# Patient Record
Sex: Female | Born: 1960 | Smoking: Never smoker
Health system: Northeastern US, Academic
[De-identification: ages and names within clinical notes are randomized; demographics above are authoritative.]

## PROBLEM LIST (undated history)

## (undated) DIAGNOSIS — F329 Major depressive disorder, single episode, unspecified: Secondary | ICD-10-CM

## (undated) DIAGNOSIS — J449 Chronic obstructive pulmonary disease, unspecified: Secondary | ICD-10-CM

## (undated) DIAGNOSIS — K5904 Chronic idiopathic constipation: Secondary | ICD-10-CM

## (undated) DIAGNOSIS — E785 Hyperlipidemia, unspecified: Secondary | ICD-10-CM

## (undated) DIAGNOSIS — F17200 Nicotine dependence, unspecified, uncomplicated: Secondary | ICD-10-CM

## (undated) DIAGNOSIS — C539 Malignant neoplasm of cervix uteri, unspecified: Secondary | ICD-10-CM

## (undated) DIAGNOSIS — F32A Depression, unspecified: Secondary | ICD-10-CM

## (undated) DIAGNOSIS — N809 Endometriosis, unspecified: Secondary | ICD-10-CM

## (undated) HISTORY — DX: Depression, unspecified: F32.A

## (undated) HISTORY — PX: NASAL SEPTUM SURGERY: SHX37

## (undated) HISTORY — PX: LASER ABLATION CONDYLOMA CERVICAL / VULVAR: SUR819

## (undated) HISTORY — PX: TONSILLECTOMY: SUR1361

## (undated) HISTORY — DX: Hyperlipidemia, unspecified: E78.5

## (undated) HISTORY — DX: Nicotine dependence, unspecified, uncomplicated: F17.200

## (undated) HISTORY — PX: BREAST EXCISIONAL BIOPSY: SUR124

## (undated) HISTORY — PX: FOOT SURGERY: SHX648

## (undated) HISTORY — DX: Malignant neoplasm of cervix uteri, unspecified: C53.9

## (undated) HISTORY — PX: ABLATION ON ENDOMETRIOSIS: SHX5787

## (undated) HISTORY — DX: Major depressive disorder, single episode, unspecified: F32.9

## (undated) HISTORY — PX: APPENDECTOMY: SHX54

## (undated) HISTORY — DX: Endometriosis, unspecified: N80.9

## (undated) HISTORY — PX: OTHER SURGICAL HISTORY: SHX169

---
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
## 1978-06-17 HISTORY — PX: OVARIAN CYST SURGERY: SHX726

## 1981-06-17 HISTORY — PX: SALPINGOOPHORECTOMY: SHX82

## 1990-06-17 HISTORY — PX: EXPLORATORY LAPAROTOMY: SUR591

## 1998-08-10 ENCOUNTER — Emergency Department (HOSPITAL_COMMUNITY): Admission: EM | Admit: 1998-08-10 | Discharge: 1998-08-10 | Payer: Self-pay | Admitting: Emergency Medicine

## 2002-04-22 ENCOUNTER — Encounter: Payer: Self-pay | Admitting: Family Medicine

## 2002-04-22 ENCOUNTER — Ambulatory Visit (HOSPITAL_COMMUNITY): Admission: RE | Admit: 2002-04-22 | Discharge: 2002-04-22 | Payer: Self-pay | Admitting: Family Medicine

## 2002-06-17 DIAGNOSIS — D329 Benign neoplasm of meninges, unspecified: Secondary | ICD-10-CM | POA: Insufficient documentation

## 2002-10-16 HISTORY — PX: CRANIECTOMY: SHX331

## 2006-01-15 HISTORY — PX: DILATION AND CURETTAGE OF UTERUS: SHX78

## 2006-05-14 ENCOUNTER — Ambulatory Visit: Payer: Self-pay | Admitting: Orthopedic Surgery

## 2006-05-26 ENCOUNTER — Ambulatory Visit: Payer: Self-pay | Admitting: Orthopedic Surgery

## 2006-06-30 ENCOUNTER — Ambulatory Visit: Payer: Self-pay | Admitting: Orthopedic Surgery

## 2008-01-30 ENCOUNTER — Emergency Department (HOSPITAL_COMMUNITY): Admission: EM | Admit: 2008-01-30 | Discharge: 2008-01-31 | Payer: Self-pay | Admitting: Emergency Medicine

## 2009-04-04 ENCOUNTER — Inpatient Hospital Stay (HOSPITAL_COMMUNITY): Admission: EM | Admit: 2009-04-04 | Discharge: 2009-04-07 | Payer: Self-pay | Admitting: Emergency Medicine

## 2009-10-11 ENCOUNTER — Other Ambulatory Visit: Admission: RE | Admit: 2009-10-11 | Discharge: 2009-10-11 | Payer: Self-pay | Admitting: Obstetrics & Gynecology

## 2009-10-12 ENCOUNTER — Ambulatory Visit (HOSPITAL_COMMUNITY): Admission: RE | Admit: 2009-10-12 | Discharge: 2009-10-12 | Payer: Self-pay | Admitting: Obstetrics & Gynecology

## 2009-10-18 ENCOUNTER — Ambulatory Visit (HOSPITAL_COMMUNITY): Admission: RE | Admit: 2009-10-18 | Discharge: 2009-10-18 | Payer: Self-pay | Admitting: Obstetrics & Gynecology

## 2010-09-20 LAB — DIFFERENTIAL
Basophils Relative: 0 % (ref 0–1)
Lymphocytes Relative: 29 % (ref 12–46)
Lymphs Abs: 3 10*3/uL (ref 0.7–4.0)
Monocytes Absolute: 0.4 10*3/uL (ref 0.1–1.0)
Monocytes Relative: 4 % (ref 3–12)
Neutro Abs: 8 10*3/uL — ABNORMAL HIGH (ref 1.7–7.7)
Neutrophils Relative %: 63 % (ref 43–77)

## 2010-09-20 LAB — BASIC METABOLIC PANEL
BUN: 7 mg/dL (ref 6–23)
CO2: 34 mEq/L — ABNORMAL HIGH (ref 19–32)
Calcium: 9.3 mg/dL (ref 8.4–10.5)
Chloride: 103 mEq/L (ref 96–112)
Creatinine, Ser: 0.43 mg/dL (ref 0.4–1.2)
GFR calc Af Amer: >60 mL/min (ref >60)
GFR calc Af Amer: >60 mL/min (ref >60)
GFR calc non Af Amer: >60 mL/min (ref >60)
Potassium: 4.4 mEq/L (ref 3.5–5.1)
Sodium: 140 mEq/L (ref 135–145)

## 2010-09-20 LAB — CBC
HCT: 42.3 % (ref 36.0–46.0)
Hemoglobin: 12.5 g/dL (ref 12.0–15.0)
MCHC: 33.8 g/dL (ref 30.0–36.0)
MCV: 86.9 fL (ref 78.0–100.0)
Platelets: 213 10*3/uL (ref 150–400)
RBC: 4.26 MIL/uL (ref 3.87–5.11)
RBC: 4.92 MIL/uL (ref 3.87–5.11)
WBC: 10.5 10*3/uL (ref 4.0–10.5)

## 2010-09-20 LAB — BLOOD GAS, ARTERIAL
Bicarbonate: 28.5 mEq/L — ABNORMAL HIGH (ref 20.0–24.0)
O2 Content: 6 L/min
pH, Arterial: 7.302 — ABNORMAL LOW (ref 7.350–7.400)
pO2, Arterial: 84.1 mmHg (ref 80.0–100.0)

## 2010-09-20 LAB — GLUCOSE, CAPILLARY
Glucose-Capillary: 148 mg/dL — ABNORMAL HIGH (ref 70–99)
Glucose-Capillary: 66 mg/dL — ABNORMAL LOW (ref 70–99)

## 2013-03-22 DIAGNOSIS — F79 Unspecified intellectual disabilities: Secondary | ICD-10-CM | POA: Insufficient documentation

## 2014-08-09 DIAGNOSIS — F329 Major depressive disorder, single episode, unspecified: Secondary | ICD-10-CM | POA: Diagnosis not present

## 2014-08-09 DIAGNOSIS — J449 Chronic obstructive pulmonary disease, unspecified: Secondary | ICD-10-CM | POA: Diagnosis not present

## 2014-08-09 DIAGNOSIS — F1020 Alcohol dependence, uncomplicated: Secondary | ICD-10-CM | POA: Diagnosis not present

## 2014-08-09 DIAGNOSIS — Z720 Tobacco use: Secondary | ICD-10-CM | POA: Diagnosis not present

## 2014-09-29 DIAGNOSIS — F411 Generalized anxiety disorder: Secondary | ICD-10-CM | POA: Insufficient documentation

## 2014-12-16 DIAGNOSIS — F1020 Alcohol dependence, uncomplicated: Secondary | ICD-10-CM | POA: Diagnosis not present

## 2014-12-16 DIAGNOSIS — F329 Major depressive disorder, single episode, unspecified: Secondary | ICD-10-CM | POA: Diagnosis not present

## 2014-12-16 DIAGNOSIS — Z681 Body mass index (BMI) 19 or less, adult: Secondary | ICD-10-CM | POA: Diagnosis not present

## 2014-12-16 DIAGNOSIS — N390 Urinary tract infection, site not specified: Secondary | ICD-10-CM | POA: Diagnosis not present

## 2015-01-16 DIAGNOSIS — Z720 Tobacco use: Secondary | ICD-10-CM | POA: Diagnosis not present

## 2015-01-16 DIAGNOSIS — F320 Major depressive disorder, single episode, mild: Secondary | ICD-10-CM | POA: Diagnosis not present

## 2015-01-16 DIAGNOSIS — Z681 Body mass index (BMI) 19 or less, adult: Secondary | ICD-10-CM | POA: Diagnosis not present

## 2015-01-16 DIAGNOSIS — F1020 Alcohol dependence, uncomplicated: Secondary | ICD-10-CM | POA: Diagnosis not present

## 2015-04-27 DIAGNOSIS — M542 Cervicalgia: Secondary | ICD-10-CM | POA: Diagnosis not present

## 2015-04-27 DIAGNOSIS — Z681 Body mass index (BMI) 19 or less, adult: Secondary | ICD-10-CM | POA: Diagnosis not present

## 2015-06-23 DIAGNOSIS — M791 Myalgia: Secondary | ICD-10-CM | POA: Diagnosis not present

## 2015-06-23 DIAGNOSIS — M9903 Segmental and somatic dysfunction of lumbar region: Secondary | ICD-10-CM | POA: Diagnosis not present

## 2015-06-23 DIAGNOSIS — M9904 Segmental and somatic dysfunction of sacral region: Secondary | ICD-10-CM | POA: Diagnosis not present

## 2015-06-23 DIAGNOSIS — M9901 Segmental and somatic dysfunction of cervical region: Secondary | ICD-10-CM | POA: Diagnosis not present

## 2015-06-23 DIAGNOSIS — M5413 Radiculopathy, cervicothoracic region: Secondary | ICD-10-CM | POA: Diagnosis not present

## 2015-09-20 DIAGNOSIS — Z681 Body mass index (BMI) 19 or less, adult: Secondary | ICD-10-CM | POA: Diagnosis not present

## 2015-09-20 DIAGNOSIS — S62357A Nondisplaced fracture of shaft of fifth metacarpal bone, left hand, initial encounter for closed fracture: Secondary | ICD-10-CM | POA: Diagnosis not present

## 2015-09-20 DIAGNOSIS — J449 Chronic obstructive pulmonary disease, unspecified: Secondary | ICD-10-CM | POA: Diagnosis not present

## 2015-09-20 DIAGNOSIS — S63055A Dislocation of other carpometacarpal joint of left hand, initial encounter: Secondary | ICD-10-CM | POA: Diagnosis not present

## 2015-09-27 ENCOUNTER — Emergency Department (HOSPITAL_COMMUNITY)
Admission: EM | Admit: 2015-09-27 | Discharge: 2015-09-28 | Disposition: A | Discharge status: Home / Self Care | Payer: Commercial Managed Care - HMO | Attending: Emergency Medicine | Admitting: Emergency Medicine

## 2015-09-27 DIAGNOSIS — Z7951 Long term (current) use of inhaled steroids: Secondary | ICD-10-CM | POA: Insufficient documentation

## 2015-09-27 DIAGNOSIS — Y905 Blood alcohol level of 100-119 mg/100 ml: Secondary | ICD-10-CM | POA: Diagnosis not present

## 2015-09-27 DIAGNOSIS — Z7982 Long term (current) use of aspirin: Secondary | ICD-10-CM | POA: Diagnosis not present

## 2015-09-27 DIAGNOSIS — F1010 Alcohol abuse, uncomplicated: Secondary | ICD-10-CM | POA: Insufficient documentation

## 2015-09-27 DIAGNOSIS — F17210 Nicotine dependence, cigarettes, uncomplicated: Secondary | ICD-10-CM | POA: Insufficient documentation

## 2015-09-27 DIAGNOSIS — J449 Chronic obstructive pulmonary disease, unspecified: Secondary | ICD-10-CM | POA: Insufficient documentation

## 2015-09-27 DIAGNOSIS — F10129 Alcohol abuse with intoxication, unspecified: Secondary | ICD-10-CM | POA: Diagnosis not present

## 2015-09-27 DIAGNOSIS — Z8719 Personal history of other diseases of the digestive system: Secondary | ICD-10-CM | POA: Diagnosis not present

## 2015-09-27 DIAGNOSIS — Z79899 Other long term (current) drug therapy: Secondary | ICD-10-CM | POA: Diagnosis not present

## 2015-09-27 HISTORY — DX: Chronic idiopathic constipation: K59.04

## 2015-09-27 HISTORY — DX: Chronic obstructive pulmonary disease, unspecified: J44.9

## 2015-09-28 ENCOUNTER — Encounter (HOSPITAL_COMMUNITY): Payer: Self-pay | Admitting: Emergency Medicine

## 2015-09-28 LAB — ETHANOL: Alcohol, Ethyl (B): 108 mg/dL — ABNORMAL HIGH (ref <5)

## 2015-09-28 LAB — CBC WITH DIFFERENTIAL/PLATELET
BASOS PCT: 0 %
Basophils Absolute: 0 10*3/uL (ref 0.0–0.1)
EOS ABS: 0.1 10*3/uL (ref 0.0–0.7)
Eosinophils Relative: 2 %
HCT: 49.2 % — ABNORMAL HIGH (ref 36.0–46.0)
Hemoglobin: 17.6 g/dL — ABNORMAL HIGH (ref 12.0–15.0)
Lymphocytes Relative: 44 %
Lymphs Abs: 4.1 10*3/uL — ABNORMAL HIGH (ref 0.7–4.0)
MCH: 31.1 pg (ref 26.0–34.0)
MCHC: 35.8 g/dL (ref 30.0–36.0)
MCV: 86.9 fL (ref 78.0–100.0)
MONO ABS: 0.5 10*3/uL (ref 0.1–1.0)
MONOS PCT: 6 %
Neutro Abs: 4.6 10*3/uL (ref 1.7–7.7)
Neutrophils Relative %: 48 %
Platelets: 204 10*3/uL (ref 150–400)
RBC: 5.66 MIL/uL — ABNORMAL HIGH (ref 3.87–5.11)
RDW: 13.9 % (ref 11.5–15.5)
WBC: 9.5 10*3/uL (ref 4.0–10.5)

## 2015-09-28 LAB — COMPREHENSIVE METABOLIC PANEL
ALBUMIN: 4.1 g/dL (ref 3.5–5.0)
ALT: 14 U/L (ref 14–54)
ANION GAP: 10 (ref 5–15)
AST: 23 U/L (ref 15–41)
Alkaline Phosphatase: 65 U/L (ref 38–126)
BILIRUBIN TOTAL: 0.7 mg/dL (ref 0.3–1.2)
BUN: 7 mg/dL (ref 6–20)
CO2: 29 mmol/L (ref 22–32)
Calcium: 9.1 mg/dL (ref 8.9–10.3)
Chloride: 97 mmol/L — ABNORMAL LOW (ref 101–111)
Creatinine, Ser: 0.58 mg/dL (ref 0.44–1.00)
GFR calc Af Amer: >60 mL/min (ref >60)
GFR calc non Af Amer: >60 mL/min (ref >60)
GLUCOSE: 98 mg/dL (ref 65–99)
POTASSIUM: 4.5 mmol/L (ref 3.5–5.1)
SODIUM: 136 mmol/L (ref 135–145)
TOTAL PROTEIN: 6.7 g/dL (ref 6.5–8.1)

## 2015-09-28 NOTE — Discharge Instructions (Signed)
Alcohol Use Disorder °Alcohol use disorder is a mental disorder. It is not a one-time incident of heavy drinking. Alcohol use disorder is the excessive and uncontrollable use of alcohol over time that leads to problems with functioning in one or more areas of daily living. People with this disorder risk harming themselves and others when they drink to excess. Alcohol use disorder also can cause other mental disorders, such as mood and anxiety disorders, and serious physical problems. People with alcohol use disorder often misuse other drugs.  °Alcohol use disorder is common and widespread. Some people with this disorder drink alcohol to cope with or escape from negative life events. Others drink to relieve chronic pain or symptoms of mental illness. People with a family history of alcohol use disorder are at higher risk of losing control and using alcohol to excess.  °Drinking too much alcohol can cause injury, accidents, and health problems. One drink can be too much when you are: °· Working. °· Pregnant or breastfeeding. °· Taking medicines. Ask your doctor. °· Driving or planning to drive. °SYMPTOMS  °Signs and symptoms of alcohol use disorder may include the following:  °· Consumption of alcohol in larger amounts or over a longer period of time than intended. °· Multiple unsuccessful attempts to cut down or control alcohol use.   °· A great deal of time spent obtaining alcohol, using alcohol, or recovering from the effects of alcohol (hangover). °· A strong desire or urge to use alcohol (cravings).   °· Continued use of alcohol despite problems at work, school, or home because of alcohol use.   °· Continued use of alcohol despite problems in relationships because of alcohol use. °· Continued use of alcohol in situations when it is physically hazardous, such as driving a car. °· Continued use of alcohol despite awareness of a physical or psychological problem that is likely related to alcohol use. Physical  problems related to alcohol use can involve the brain, heart, liver, stomach, and intestines. Psychological problems related to alcohol use include intoxication, depression, anxiety, psychosis, delirium, and dementia.   °· The need for increased amounts of alcohol to achieve the same desired effect, or a decreased effect from the consumption of the same amount of alcohol (tolerance). °· Withdrawal symptoms upon reducing or stopping alcohol use, or alcohol use to reduce or avoid withdrawal symptoms. Withdrawal symptoms include: °¨ Racing heart. °¨ Hand tremor. °¨ Difficulty sleeping. °¨ Nausea. °¨ Vomiting. °¨ Hallucinations. °¨ Restlessness. °¨ Seizures. °DIAGNOSIS °Alcohol use disorder is diagnosed through an assessment by your health care provider. Your health care provider may start by asking three or four questions to screen for excessive or problematic alcohol use. To confirm a diagnosis of alcohol use disorder, at least two symptoms must be present within a 12-month period. The severity of alcohol use disorder depends on the number of symptoms: °· Mild--two or three. °· Moderate--four or five. °· Severe--six or more. °Your health care provider may perform a physical exam or use results from lab tests to see if you have physical problems resulting from alcohol use. Your health care provider may refer you to a mental health professional for evaluation. °TREATMENT  °Some people with alcohol use disorder are able to reduce their alcohol use to low-risk levels. Some people with alcohol use disorder need to quit drinking alcohol. When necessary, mental health professionals with specialized training in substance use treatment can help. Your health care provider can help you decide how severe your alcohol use disorder is and what type of treatment you need.   The following forms of treatment are available:  °· Detoxification. Detoxification involves the use of prescription medicines to prevent alcohol withdrawal  symptoms in the first week after quitting. This is important for people with a history of symptoms of withdrawal and for heavy drinkers who are likely to have withdrawal symptoms. Alcohol withdrawal can be dangerous and, in severe cases, cause death. Detoxification is usually provided in a hospital or in-patient substance use treatment facility. °· Counseling or talk therapy. Talk therapy is provided by substance use treatment counselors. It addresses the reasons people use alcohol and ways to keep them from drinking again. The goals of talk therapy are to help people with alcohol use disorder find healthy activities and ways to cope with life stress, to identify and avoid triggers for alcohol use, and to handle cravings, which can cause relapse. °· Medicines. Different medicines can help treat alcohol use disorder through the following actions: °¨ Decrease alcohol cravings. °¨ Decrease the positive reward response felt from alcohol use. °¨ Produce an uncomfortable physical reaction when alcohol is used (aversion therapy). °· Support groups. Support groups are run by people who have quit drinking. They provide emotional support, advice, and guidance. °These forms of treatment are often combined. Some people with alcohol use disorder benefit from intensive combination treatment provided by specialized substance use treatment centers. Both inpatient and outpatient treatment programs are available. °  °This information is not intended to replace advice given to you by your health care provider. Make sure you discuss any questions you have with your health care provider. °  °Document Released: 07/11/2004 Document Revised: 06/24/2014 Document Reviewed: 09/10/2012 °Elsevier Interactive Patient Education ©2016 [employer] °Substance Abuse Treatment Programs ° °Intensive Outpatient Programs °[facility]     °[address]      °[place], [place]                   °336-878-6098      ° °[facility] °[address] °[place], [place] °[phone] ° °[facility]     °(Inpatient and outpatient)     °[address]           °[phone]   ° °[facility] °336-288-1484 (Suboxone and Methadone) ° °[address]      °[place], [place] [address]      °336-882-2125      ° °[address] °[place], [place] °852-3033 ° °[facility] (Outpatient/Inpatient, [facility])    °(insurance only) [phone]      °       °[facility]Groups & Residential) °[place], [place] °336-389-1413 ° °   °[facility]     °[address]     °[place], [place]      °[phone]      ° °[facility] (for caregivers and family) °[address] °[place], [place] °336-299-4655 ° ° ° ° ° °Residential Treatment Programs °[facility]      °[address], [place], [place] [address]  °([phone]      ° °[facility] °[address] [place], [place] [address] °[phone] ° °[facility]        °336-248-8914      ° °[facility] °1-800-659-3381 ° °[facility]             °[address]                                         °  [place], [place]                                                °877-615-2722 or 336-784-9470                              ° °[facility] [place] °[address] °[place], [address] °1.877.941.8954 ° °[facility]    °[address]      °[place], [place]     °[phone]      ° °[facility] °[address] °[place], [place] °336-285-9073 ° °[facility]   °[address]     °[place], [place] [address]     °336-899-1550      °Admissions: 8am-3pm M-F ° °[facility] °[address] °[place], [place] °336-227-7417 ° °[facility]: Residential Program (90 Days)   °[place], [place]      °336-725-8389 or 800-758-6077    ° °[facility] °[place], [place] °(Walk in Hours over the weekend or by  referral) ° °[facility] °[address], [place], [place] [address] °(336) [phone] ° °Crisis Mobile: [facility]:  1-877-626-1772 (for crisis response 24 hours a day) °[facility] Hotline:      1-800-256-2452 °Outpatient Psychiatry and Counseling ° °[facility]: Mobile Crisis Management 24 hours:  1-877-626-1772 ° °[facility] sliding scale fee and walk in schedule: M-F 8am-12pm/1pm-3pm °[address]  °[place], [place] [address] °[phone] ° °[facility] °[address] °[place], [place] [address] °336-703-9650 ° °[facility] ([facility] known [facility])- new patient walk-in appointments available Monday - Friday 8am -3pm.          °[address] °[place], [place] [address] °336-676-6840 or crisis line- 336-676-6905 ° °[facility] Intensive Outpatient Therapy Program °[address] °[place], [place] [address] °336-832-9804 ° °[facility]                  °Crisis Services      °336.641.4993      °[address]     °[place], [place] [address]                ° °[facility]   °[facility] °[phone] °[address] °[place], [place] [address] ° ° °[facility]          °[address]  °[place], [place] [address]       °(336) [phone] ° °[facility] °[address] °[place], [place] [address] °336-292-1510 ° °[facility]    °[address]    °[place], [place] [address]     °[phone]      ° °Parish McKinney, MD     °[address]     °[place] [address]     °336-282-1251     °  °[facility] °[address] °[place]  [address] ° °[facility]     °[address]     °[place],       °336-542-2076      ° °[facility] °Shamsher Ahluwalia, MD °[address] °  [place], [place]  [address] °[phone] ° °[facility]     °[address]     °[place], [place] [address]     °336-274-1237      ° °[facility] °[address] °[place], [place] [address] °336-854-4450 °Resources for Temporary Residential Assistance/Crisis Centers ° °DAY CENTERS °[facility] °M-F 8am-3pm   °[address] [place], [place] [address]   336-332-0824 °Services include: laundry, barbering, support groups, case management, phone  & computer access, showers, AA/NA mtgs, mental health/substance abuse nurse, job skills class, disability information, VA assistance, spiritual classes, etc.  ° °HOMELESS SHELTERS ° °[facility]     °[facility]   °[address], [place]     °336.271.5959       °       °[facility] (women and children)       °[address] °[place], [place] [address] °336-275-0820 °[email or web address] for application and process °Application Required ° °[facility]   °[address]    °[place] [address]     °[phone]       °             °[facility] °[address] °[place], [place] [address] °336.273.5572 °336-235-0363(schedule application appt.) °Application Required ° °[facility] (women only)    °[address]     °[place], [place] [address]     °336-884-1039      °Intake starts 6pm daily °Need valid ID, SSC, & Police report °[facility] [place] °[address] °[place], [place] °336-881-5420 °Application Required ° °[facility] (men only)     °[address]      °[place], [place]     °[phone]      ° °[facility] °(Pregnant women only) °[address] °, [place] °336-275-0206 ° °[facility]      °[address]      °[place], [place] [address]     °[phone]      °       °[facility] °[address] °[place], [place] °336-723-1848 °90 day commitment/SA/Application process ° °[facility]     °[address]     °[place], [place]     °[phone]       °Check-in at 7pm     °       °[facility] °[address] °[place], [place] [address] °336-248-6684 °Men/Women/Women and Children must be there by 7 pm ° °[facility] °[place],  °336-722-8721                ° °

## 2015-09-28 NOTE — ED Provider Notes (Signed)
CSN: [id number]     Arrival date & time 09/27/15  2343 History   First MD Initiated Contact with Patient 09/28/15 [phone]     Chief Complaint  Patient presents with  . Alcohol Problem     (Consider location/radiation/quality/duration/timing/severity/associated sxs/prior Treatment) HPI Comments: Patient presents to the ER for evaluation of alcohol abuse. Patient reports that she has been drinking for 25-30 years. Her last drink was one and a half hours ago. She reports that she finally got up the nerve tonight to stop drinking. She told her mother who apparently called the police and she was brought here for treatment. She is not homicidal or suicidal. Patient reports that she does get shaky sometimes when she doesn't drink but she has never had DTs, seizures, significant withdrawal symptoms.  Patient is a 55 y.o. female presenting with alcohol problem.  Alcohol Problem    Past Medical History  Diagnosis Date  . COPD (chronic obstructive pulmonary disease) ([place])   . Chronic idiopathic constipation    History reviewed. No pertinent past surgical history. History reviewed. No pertinent family history. Social History  Substance Use Topics  . Smoking status: Current Every Day Smoker -- 2.00 packs/day    Types: Cigarettes  . Smokeless tobacco: None  . Alcohol Use: Yes   OB History    No data available     Review of Systems  Psychiatric/Behavioral: Negative for suicidal ideas.  All other systems reviewed and are negative.     Allergies  Imitrex  Home Medications   Prior to Admission medications   Medication Sig Start Date End Date Taking? Authorizing Provider  aspirin 81 MG chewable tablet Chew 81 mg by mouth daily.   Yes Historical Provider, MD  cholecalciferol (VITAMIN D) 1000 units tablet Take 1,000 Units by mouth daily.   Yes Historical Provider, MD  diazepam (VALIUM) 5 MG tablet Take 5 mg by mouth daily as needed for anxiety.   Yes Historical Provider, MD   Fluticasone-Salmeterol (ADVAIR) 250-50 MCG/DOSE AEPB Inhale 1 puff into the lungs 2 (two) times daily.   Yes Historical Provider, MD  gabapentin (NEURONTIN) 300 MG capsule Take 300 mg by mouth 3 (three) times daily.   Yes Historical Provider, MD  lubiprostone (AMITIZA) 24 MCG capsule Take 24 mcg by mouth daily.   Yes Historical Provider, MD  Multiple Vitamins-Minerals (MULTIVITAMIN ADULT) TABS Take 1 tablet by mouth daily.   Yes Historical Provider, MD  tiotropium (SPIRIVA) 18 MCG inhalation capsule Place 18 mcg into inhaler and inhale daily.   Yes Historical Provider, MD  traZODone (DESYREL) 50 MG tablet Take 100 mg by mouth at bedtime.   Yes Historical Provider, MD  venlafaxine XR (EFFEXOR-XR) 150 MG 24 hr capsule Take 150 mg by mouth daily with breakfast.   Yes Historical Provider, MD  vitamin B-12 (CYANOCOBALAMIN) 1000 MCG tablet Take 1,000 mcg by mouth daily.   Yes Historical Provider, MD  vitamin E 100 UNIT capsule Take 100 Units by mouth daily.   Yes Historical Provider, MD   BP 109/87 mmHg  Pulse 114  Temp(Src) 98.5 F (36.9 C) (Oral)  Resp 20  SpO2 93% Physical Exam  Constitutional: She is oriented to person, place, and time. She appears well-developed and well-nourished. No distress.  HENT:  Head: Normocephalic and atraumatic.  Right Ear: Hearing normal.  Left Ear: Hearing normal.  Nose: Nose normal.  Mouth/Throat: Oropharynx is clear and moist and mucous membranes are normal.  Eyes: Conjunctivae and EOM are normal. Pupils are equal,  round, and reactive to light.  Neck: Normal range of motion. Neck supple.  Cardiovascular: Regular rhythm, S1 normal and S2 normal.  Exam reveals no gallop and no friction rub.   No murmur heard. Pulmonary/Chest: Effort normal and breath sounds normal. No respiratory distress. She exhibits no tenderness.  Abdominal: Soft. Normal appearance and bowel sounds are normal. There is no hepatosplenomegaly. There is no tenderness. There is no rebound, no  guarding, no tenderness at McBurney's point and negative Murphy's sign. No hernia.  Musculoskeletal: Normal range of motion.  Neurological: She is alert and oriented to person, place, and time. She has normal strength. No cranial nerve deficit or sensory deficit. Coordination normal. GCS eye subscore is 4. GCS verbal subscore is 5. GCS motor subscore is 6.  Skin: Skin is warm, dry and intact. No rash noted. No cyanosis.  Psychiatric: She has a normal mood and affect. Her speech is normal and behavior is normal. Thought content normal.  Nursing note and vitals reviewed.   ED Course  Procedures (including critical care time) Labs Review Labs Reviewed - No data to display  Imaging Review No results found. I have personally reviewed and evaluated these images and lab results as part of my medical decision-making.   EKG Interpretation None      MDM   Final diagnoses:  None  Alcohol Abuse  Presents to the ER stating that she wants to be voluntarily committed for alcohol abuse. She is not homicidal or suicidal. She has been drinking for nearly 30 years, does not have any history of significant withdrawal risk. Patient informed that she cannot be committed for alcohol, we do not have a detox program. She will receive screening labs in the event that they are needed when she makes phone calls herself tomorrow for detox placement.    Orpah Greek, MD 09/28/15 [phone]

## 2015-09-28 NOTE — ED Notes (Addendum)
Pt states she wants help getting off of alcohol. States she drinks about a 1/2 gallon of Jim Beam in two days x 25 years. Last drink at 11pm. Alert and oriented. Denies SI/HI.

## 2015-09-28 NOTE — ED Notes (Signed)
Patient was alert, oriented and stable upon discharge. RN went over AVS and patient had no further questions.  

## 2015-10-25 DIAGNOSIS — F1020 Alcohol dependence, uncomplicated: Secondary | ICD-10-CM | POA: Diagnosis not present

## 2015-10-25 DIAGNOSIS — R64 Cachexia: Secondary | ICD-10-CM | POA: Diagnosis not present

## 2015-10-25 DIAGNOSIS — Z681 Body mass index (BMI) 19 or less, adult: Secondary | ICD-10-CM | POA: Diagnosis not present

## 2015-10-25 DIAGNOSIS — Z0000 Encounter for general adult medical examination without abnormal findings: Secondary | ICD-10-CM | POA: Diagnosis not present

## 2015-10-25 DIAGNOSIS — F329 Major depressive disorder, single episode, unspecified: Secondary | ICD-10-CM | POA: Diagnosis not present

## 2015-11-28 DIAGNOSIS — Z681 Body mass index (BMI) 19 or less, adult: Secondary | ICD-10-CM | POA: Diagnosis not present

## 2015-11-28 DIAGNOSIS — H1030 Unspecified acute conjunctivitis, unspecified eye: Secondary | ICD-10-CM | POA: Diagnosis not present

## 2016-01-24 DIAGNOSIS — Z720 Tobacco use: Secondary | ICD-10-CM | POA: Diagnosis not present

## 2016-01-24 DIAGNOSIS — Z681 Body mass index (BMI) 19 or less, adult: Secondary | ICD-10-CM | POA: Diagnosis not present

## 2016-01-24 DIAGNOSIS — J440 Chronic obstructive pulmonary disease with acute lower respiratory infection: Secondary | ICD-10-CM | POA: Diagnosis not present

## 2016-01-31 DIAGNOSIS — Z681 Body mass index (BMI) 19 or less, adult: Secondary | ICD-10-CM | POA: Diagnosis not present

## 2016-01-31 DIAGNOSIS — J181 Lobar pneumonia, unspecified organism: Secondary | ICD-10-CM | POA: Diagnosis not present

## 2016-02-07 DIAGNOSIS — Z681 Body mass index (BMI) 19 or less, adult: Secondary | ICD-10-CM | POA: Diagnosis not present

## 2016-02-07 DIAGNOSIS — J181 Lobar pneumonia, unspecified organism: Secondary | ICD-10-CM | POA: Diagnosis not present

## 2016-02-07 DIAGNOSIS — J440 Chronic obstructive pulmonary disease with acute lower respiratory infection: Secondary | ICD-10-CM | POA: Diagnosis not present

## 2016-02-20 DIAGNOSIS — J449 Chronic obstructive pulmonary disease, unspecified: Secondary | ICD-10-CM | POA: Diagnosis not present

## 2016-02-28 DIAGNOSIS — J440 Chronic obstructive pulmonary disease with acute lower respiratory infection: Secondary | ICD-10-CM | POA: Diagnosis not present

## 2016-02-28 DIAGNOSIS — R918 Other nonspecific abnormal finding of lung field: Secondary | ICD-10-CM | POA: Diagnosis not present

## 2016-02-28 DIAGNOSIS — G5702 Lesion of sciatic nerve, left lower limb: Secondary | ICD-10-CM | POA: Diagnosis not present

## 2016-02-28 DIAGNOSIS — J449 Chronic obstructive pulmonary disease, unspecified: Secondary | ICD-10-CM | POA: Diagnosis not present

## 2016-02-28 DIAGNOSIS — Z681 Body mass index (BMI) 19 or less, adult: Secondary | ICD-10-CM | POA: Diagnosis not present

## 2016-02-28 DIAGNOSIS — J984 Other disorders of lung: Secondary | ICD-10-CM | POA: Diagnosis not present

## 2016-02-29 ENCOUNTER — Other Ambulatory Visit: Payer: Self-pay | Admitting: Allergy

## 2016-02-29 DIAGNOSIS — J984 Other disorders of lung: Secondary | ICD-10-CM

## 2016-02-29 DIAGNOSIS — R918 Other nonspecific abnormal finding of lung field: Secondary | ICD-10-CM

## 2016-03-12 ENCOUNTER — Ambulatory Visit
Admission: RE | Admit: 2016-03-12 | Discharge: 2016-03-12 | Disposition: A | Discharge status: Home / Self Care | Payer: Commercial Managed Care - HMO | Source: Ambulatory Visit | Attending: Allergy | Admitting: Allergy

## 2016-03-12 DIAGNOSIS — E041 Nontoxic single thyroid nodule: Secondary | ICD-10-CM | POA: Diagnosis not present

## 2016-03-12 DIAGNOSIS — I7090 Unspecified atherosclerosis: Secondary | ICD-10-CM | POA: Diagnosis not present

## 2016-03-12 DIAGNOSIS — J439 Emphysema, unspecified: Secondary | ICD-10-CM | POA: Diagnosis not present

## 2016-03-12 DIAGNOSIS — R918 Other nonspecific abnormal finding of lung field: Secondary | ICD-10-CM | POA: Diagnosis not present

## 2016-03-12 DIAGNOSIS — J984 Other disorders of lung: Secondary | ICD-10-CM | POA: Insufficient documentation

## 2016-03-12 DIAGNOSIS — J189 Pneumonia, unspecified organism: Secondary | ICD-10-CM | POA: Diagnosis not present

## 2016-03-13 DIAGNOSIS — Z681 Body mass index (BMI) 19 or less, adult: Secondary | ICD-10-CM | POA: Diagnosis not present

## 2016-03-13 DIAGNOSIS — M5442 Lumbago with sciatica, left side: Secondary | ICD-10-CM | POA: Diagnosis not present

## 2016-03-13 DIAGNOSIS — R531 Weakness: Secondary | ICD-10-CM | POA: Diagnosis not present

## 2016-03-13 DIAGNOSIS — J984 Other disorders of lung: Secondary | ICD-10-CM | POA: Diagnosis not present

## 2016-03-13 DIAGNOSIS — M6259 Muscle wasting and atrophy, not elsewhere classified, multiple sites: Secondary | ICD-10-CM | POA: Diagnosis not present

## 2016-03-13 DIAGNOSIS — K5904 Chronic idiopathic constipation: Secondary | ICD-10-CM | POA: Diagnosis not present

## 2016-03-13 DIAGNOSIS — J449 Chronic obstructive pulmonary disease, unspecified: Secondary | ICD-10-CM | POA: Diagnosis not present

## 2016-03-21 DIAGNOSIS — J449 Chronic obstructive pulmonary disease, unspecified: Secondary | ICD-10-CM | POA: Diagnosis not present

## 2016-03-27 DIAGNOSIS — M6281 Muscle weakness (generalized): Secondary | ICD-10-CM | POA: Diagnosis not present

## 2016-03-27 DIAGNOSIS — R262 Difficulty in walking, not elsewhere classified: Secondary | ICD-10-CM | POA: Diagnosis not present

## 2016-03-27 DIAGNOSIS — M545 Low back pain: Secondary | ICD-10-CM | POA: Diagnosis not present

## 2016-04-01 DIAGNOSIS — R262 Difficulty in walking, not elsewhere classified: Secondary | ICD-10-CM | POA: Diagnosis not present

## 2016-04-01 DIAGNOSIS — M545 Low back pain: Secondary | ICD-10-CM | POA: Diagnosis not present

## 2016-04-01 DIAGNOSIS — M6281 Muscle weakness (generalized): Secondary | ICD-10-CM | POA: Diagnosis not present

## 2016-04-02 DIAGNOSIS — M545 Low back pain: Secondary | ICD-10-CM | POA: Diagnosis not present

## 2016-04-02 DIAGNOSIS — R262 Difficulty in walking, not elsewhere classified: Secondary | ICD-10-CM | POA: Diagnosis not present

## 2016-04-02 DIAGNOSIS — M6281 Muscle weakness (generalized): Secondary | ICD-10-CM | POA: Diagnosis not present

## 2016-04-03 DIAGNOSIS — J449 Chronic obstructive pulmonary disease, unspecified: Secondary | ICD-10-CM | POA: Diagnosis not present

## 2016-04-03 DIAGNOSIS — M545 Low back pain: Secondary | ICD-10-CM | POA: Diagnosis not present

## 2016-04-03 DIAGNOSIS — Z681 Body mass index (BMI) 19 or less, adult: Secondary | ICD-10-CM | POA: Diagnosis not present

## 2016-04-03 DIAGNOSIS — M6281 Muscle weakness (generalized): Secondary | ICD-10-CM | POA: Diagnosis not present

## 2016-04-03 DIAGNOSIS — R262 Difficulty in walking, not elsewhere classified: Secondary | ICD-10-CM | POA: Diagnosis not present

## 2016-04-09 ENCOUNTER — Emergency Department: Payer: Commercial Managed Care - HMO

## 2016-04-09 ENCOUNTER — Inpatient Hospital Stay
Admission: EM | Admit: 2016-04-09 | Discharge: 2016-04-11 | DRG: 190 | Disposition: A | Discharge status: Home / Self Care | Payer: Commercial Managed Care - HMO | Attending: Pulmonary Disease | Admitting: Pulmonary Disease

## 2016-04-09 ENCOUNTER — Encounter: Payer: Self-pay | Admitting: Emergency Medicine

## 2016-04-09 DIAGNOSIS — F17210 Nicotine dependence, cigarettes, uncomplicated: Secondary | ICD-10-CM | POA: Diagnosis not present

## 2016-04-09 DIAGNOSIS — Z79899 Other long term (current) drug therapy: Secondary | ICD-10-CM

## 2016-04-09 DIAGNOSIS — Z7982 Long term (current) use of aspirin: Secondary | ICD-10-CM

## 2016-04-09 DIAGNOSIS — J441 Chronic obstructive pulmonary disease with (acute) exacerbation: Principal | ICD-10-CM | POA: Diagnosis present

## 2016-04-09 DIAGNOSIS — J9621 Acute and chronic respiratory failure with hypoxia: Secondary | ICD-10-CM | POA: Diagnosis not present

## 2016-04-09 DIAGNOSIS — R0600 Dyspnea, unspecified: Secondary | ICD-10-CM | POA: Diagnosis not present

## 2016-04-09 DIAGNOSIS — R739 Hyperglycemia, unspecified: Secondary | ICD-10-CM | POA: Diagnosis present

## 2016-04-09 DIAGNOSIS — Z888 Allergy status to other drugs, medicaments and biological substances status: Secondary | ICD-10-CM | POA: Diagnosis not present

## 2016-04-09 DIAGNOSIS — Z9981 Dependence on supplemental oxygen: Secondary | ICD-10-CM

## 2016-04-09 DIAGNOSIS — K5904 Chronic idiopathic constipation: Secondary | ICD-10-CM | POA: Diagnosis not present

## 2016-04-09 DIAGNOSIS — J9620 Acute and chronic respiratory failure, unspecified whether with hypoxia or hypercapnia: Secondary | ICD-10-CM | POA: Diagnosis present

## 2016-04-09 DIAGNOSIS — J9622 Acute and chronic respiratory failure with hypercapnia: Secondary | ICD-10-CM | POA: Diagnosis not present

## 2016-04-09 DIAGNOSIS — R0602 Shortness of breath: Secondary | ICD-10-CM | POA: Diagnosis not present

## 2016-04-09 DIAGNOSIS — F17200 Nicotine dependence, unspecified, uncomplicated: Secondary | ICD-10-CM | POA: Diagnosis not present

## 2016-04-09 DIAGNOSIS — T380X5A Adverse effect of glucocorticoids and synthetic analogues, initial encounter: Secondary | ICD-10-CM | POA: Diagnosis present

## 2016-04-09 DIAGNOSIS — R0902 Hypoxemia: Secondary | ICD-10-CM | POA: Diagnosis not present

## 2016-04-09 LAB — URINALYSIS COMPLETE WITH MICROSCOPIC (ARMC ONLY)
Bacteria, UA: NONE SEEN
Bilirubin Urine: NEGATIVE
Glucose, UA: NEGATIVE mg/dL
Nitrite: NEGATIVE
PH: 5 (ref 5.0–8.0)
PROTEIN: NEGATIVE mg/dL
SPECIFIC GRAVITY, URINE: 1.002 — AB (ref 1.005–1.030)

## 2016-04-09 LAB — CBC WITH DIFFERENTIAL/PLATELET
BASOS PCT: 1 %
Basophils Absolute: 0.1 10*3/uL (ref 0–0.1)
Eosinophils Absolute: 0.2 10*3/uL (ref 0–0.7)
Eosinophils Relative: 1 %
HEMATOCRIT: 38.5 % (ref 35.0–47.0)
HEMOGLOBIN: 13.2 g/dL (ref 12.0–16.0)
Lymphocytes Relative: 17 %
Lymphs Abs: 2.7 10*3/uL (ref 1.0–3.6)
MCH: 30.8 pg (ref 26.0–34.0)
MCHC: 34.4 g/dL (ref 32.0–36.0)
MCV: 89.5 fL (ref 80.0–100.0)
MONOS PCT: 8 %
Monocytes Absolute: 1.3 10*3/uL — ABNORMAL HIGH (ref 0.2–0.9)
NEUTROS ABS: 11.9 10*3/uL — AB (ref 1.4–6.5)
NEUTROS PCT: 73 %
Platelets: 265 10*3/uL (ref 150–440)
RBC: 4.29 MIL/uL (ref 3.80–5.20)
RDW: 14.4 % (ref 11.5–14.5)
WBC: 16.1 10*3/uL — ABNORMAL HIGH (ref 3.6–11.0)

## 2016-04-09 LAB — BLOOD GAS, ARTERIAL
ACID-BASE EXCESS: 4 mmol/L — AB (ref 0.0–2.0)
Bicarbonate: 32 mmol/L — ABNORMAL HIGH (ref 20.0–28.0)
FIO2: 0.44
O2 SAT: 88.7 %
PCO2 ART: 65 mmHg — AB (ref 32.0–48.0)
Patient temperature: 37
pH, Arterial: 7.3 — ABNORMAL LOW (ref 7.350–7.450)
pO2, Arterial: 62 mmHg — ABNORMAL LOW (ref 83.0–108.0)

## 2016-04-09 LAB — COMPREHENSIVE METABOLIC PANEL
ALT: 13 U/L — ABNORMAL LOW (ref 14–54)
ANION GAP: 8 (ref 5–15)
AST: 25 U/L (ref 15–41)
Albumin: 3.6 g/dL (ref 3.5–5.0)
Alkaline Phosphatase: 77 U/L (ref 38–126)
BILIRUBIN TOTAL: 0.7 mg/dL (ref 0.3–1.2)
BUN: 5 mg/dL — AB (ref 6–20)
CALCIUM: 8.5 mg/dL — AB (ref 8.9–10.3)
CO2: 31 mmol/L (ref 22–32)
Chloride: 101 mmol/L (ref 101–111)
Creatinine, Ser: 0.49 mg/dL (ref 0.44–1.00)
GFR calc Af Amer: >60 mL/min (ref >60)
Glucose, Bld: 135 mg/dL — ABNORMAL HIGH (ref 65–99)
POTASSIUM: 3.6 mmol/L (ref 3.5–5.1)
Sodium: 140 mmol/L (ref 135–145)
TOTAL PROTEIN: 6.7 g/dL (ref 6.5–8.1)

## 2016-04-09 LAB — LACTIC ACID, PLASMA: Lactic Acid, Venous: 1.4 mmol/L (ref 0.5–1.9)

## 2016-04-09 LAB — TROPONIN I

## 2016-04-09 MED ORDER — DIAZEPAM 5 MG PO TABS
5.0000 mg | ORAL_TABLET | Freq: Every day | ORAL | Status: DC
  Administered 2016-04-11: 5 mg via ORAL
  Filled 2016-04-09: qty 1

## 2016-04-09 MED ORDER — ASPIRIN EC 81 MG PO TBEC
81.0000 mg | DELAYED_RELEASE_TABLET | Freq: Every day | ORAL | Status: DC
  Administered 2016-04-10 – 2016-04-11 (×2): 81 mg via ORAL
  Filled 2016-04-09 (×2): qty 1

## 2016-04-09 MED ORDER — GABAPENTIN 600 MG PO TABS
300.0000 mg | ORAL_TABLET | Freq: Three times a day (TID) | ORAL | Status: DC
  Administered 2016-04-09 – 2016-04-11 (×5): 300 mg via ORAL
  Filled 2016-04-09 (×5): qty 1

## 2016-04-09 MED ORDER — SODIUM CHLORIDE 0.9 % IV BOLUS (SEPSIS)
1000.0000 mL | Freq: Once | INTRAVENOUS | Status: AC
  Administered 2016-04-09: 1000 mL via INTRAVENOUS

## 2016-04-09 MED ORDER — MAGNESIUM SULFATE 2 GM/50ML IV SOLN
2.0000 g | Freq: Once | INTRAVENOUS | Status: AC
  Administered 2016-04-09: 2 g via INTRAVENOUS
  Filled 2016-04-09: qty 50

## 2016-04-09 MED ORDER — ACETAMINOPHEN 325 MG PO TABS
650.0000 mg | ORAL_TABLET | ORAL | Status: DC | PRN
  Administered 2016-04-11: 650 mg via ORAL
  Filled 2016-04-09: qty 2

## 2016-04-09 MED ORDER — IPRATROPIUM-ALBUTEROL 0.5-2.5 (3) MG/3ML IN SOLN
3.0000 mL | RESPIRATORY_TRACT | Status: DC
  Administered 2016-04-10 (×3): 3 mL via RESPIRATORY_TRACT
  Filled 2016-04-09 (×3): qty 3

## 2016-04-09 MED ORDER — DEXTROSE 5 % IV SOLN
500.0000 mg | Freq: Once | INTRAVENOUS | Status: AC
  Administered 2016-04-09: 500 mg via INTRAVENOUS
  Filled 2016-04-09: qty 500

## 2016-04-09 MED ORDER — TRAZODONE HCL 50 MG PO TABS
50.0000 mg | ORAL_TABLET | Freq: Every day | ORAL | Status: DC
  Administered 2016-04-10 (×2): 50 mg via ORAL
  Filled 2016-04-09 (×2): qty 1

## 2016-04-09 MED ORDER — CEFTRIAXONE SODIUM 1 G IJ SOLR: 1.0000 g | Freq: Once | INTRAMUSCULAR | Status: DC

## 2016-04-09 MED ORDER — VITAMIN B-12 1000 MCG PO TABS
1000.0000 ug | ORAL_TABLET | Freq: Every day | ORAL | Status: DC
  Administered 2016-04-10 – 2016-04-11 (×2): 1000 ug via ORAL
  Filled 2016-04-09 (×2): qty 1

## 2016-04-09 MED ORDER — VENLAFAXINE HCL ER 75 MG PO CP24
150.0000 mg | ORAL_CAPSULE | Freq: Every day | ORAL | Status: DC
  Administered 2016-04-10 – 2016-04-11 (×2): 150 mg via ORAL
  Filled 2016-04-09 (×2): qty 2

## 2016-04-09 MED ORDER — METHYLPREDNISOLONE SODIUM SUCC 125 MG IJ SOLR
60.0000 mg | Freq: Four times a day (QID) | INTRAMUSCULAR | Status: DC
  Administered 2016-04-10 (×2): 60 mg via INTRAVENOUS
  Filled 2016-04-09 (×2): qty 2

## 2016-04-09 MED ORDER — CEFTRIAXONE SODIUM-DEXTROSE 1-3.74 GM-% IV SOLR
1.0000 g | INTRAVENOUS | Status: DC
  Administered 2016-04-10: 1 g via INTRAVENOUS
  Filled 2016-04-09 (×2): qty 50

## 2016-04-09 MED ORDER — SODIUM CHLORIDE 0.9 % IV BOLUS (SEPSIS)
500.0000 mL | Freq: Once | INTRAVENOUS | Status: AC
  Administered 2016-04-09: 500 mL via INTRAVENOUS

## 2016-04-09 MED ORDER — HEPARIN SODIUM (PORCINE) 5000 UNIT/ML IJ SOLN
5000.0000 [IU] | Freq: Three times a day (TID) | INTRAMUSCULAR | Status: DC
  Administered 2016-04-10 – 2016-04-11 (×4): 5000 [IU] via SUBCUTANEOUS
  Filled 2016-04-09 (×4): qty 1

## 2016-04-09 MED ORDER — IPRATROPIUM-ALBUTEROL 0.5-2.5 (3) MG/3ML IN SOLN: 3.0000 mL | RESPIRATORY_TRACT | Status: DC | PRN

## 2016-04-09 MED ORDER — AZITHROMYCIN 500 MG PO TABS: 500.0000 mg | ORAL_TABLET | Freq: Every day | ORAL | Status: DC

## 2016-04-09 MED ORDER — ALBUTEROL SULFATE (2.5 MG/3ML) 0.083% IN NEBU
10.0000 mg | INHALATION_SOLUTION | Freq: Once | RESPIRATORY_TRACT | Status: AC
  Administered 2016-04-09: 10 mg via RESPIRATORY_TRACT
  Filled 2016-04-09: qty 12

## 2016-04-09 MED ORDER — CEFTRIAXONE SODIUM-DEXTROSE 1-3.74 GM-% IV SOLR
1.0000 g | Freq: Once | INTRAVENOUS | Status: AC
  Administered 2016-04-09: 1 g via INTRAVENOUS
  Filled 2016-04-09: qty 50

## 2016-04-09 NOTE — ED Triage Notes (Signed)
Pt arrived via EMS from home c/o SOB. Pt reports that she was diagnosed with pneumonia 4 weeks ago and given levaquin and prednisone, both of which she finished recently. Pt reports that since she finished these, she has been getting progressively SOB over the last few days. Pt has decreased lung sounds throughout all fields with expiratory wheezes and rhonchi in the left lower lung. EMS gave pt 1 atrovent, 15 of albuterol and 125 of solumedrol.

## 2016-04-09 NOTE — ED Notes (Signed)
Report given to floor RN. Pt taken to floor via stretcher. Vital signs stable prior to transport.  

## 2016-04-09 NOTE — ED Notes (Addendum)
Pt aware that we need urine sample. Reports that she used the restroom just prior to coming to the ER. Will let fluids infuse some and then try for urine sample. Called respiratory and let them know that we need ABG. Maudie Mercury reported that she is on the floor doing breathing treatments and will be down as soon as possible to draw ABG.

## 2016-04-09 NOTE — H&P (Signed)
PULMONARY / CRITICAL CARE MEDICINE   Name: Christine Serrano MRN: [id number] DOB: 1961-04-22    ADMISSION DATE:  04/09/2016 CONSULTATION DATE: 04/09/16  REFERRING MD: Dr. Drenda Freeze  CHIEF COMPLAINT:  Shortness of breath  HISTORY OF PRESENT ILLNESS:   Christine Serrano is a 55 yo female  with Hx of COPD, chronic idiopathic constipation and tobacco abuse.  She was suffering from coughing for the past month and was given levaquin  And prednisone by her PCP.  She felt better initially but her shortness of breath has progressively worsened over last week.  She states that she had a low grade temp of 100 f.  She called EMS for acute shortness of breath,and very tight neck.  She received 15mg  of albuterol and 1mg  of atrovent and 125 mg of solumedrol. Patient was brought to [facility] She was satting 85-86% on [place] therefore was placed on BiPAP and PCCM team was called to admit the patient.  PAST MEDICAL HISTORY :  She  has a past medical history of Chronic idiopathic constipation and COPD (chronic obstructive pulmonary disease) ([place]).  PAST SURGICAL HISTORY: She  has no past surgical history on file.  Allergies  Allergen Reactions  . Imitrex [Sumatriptan] Other (See Comments)    Worsens migraine     No current facility-administered medications on file prior to encounter.    Current Outpatient Prescriptions on File Prior to Encounter  Medication Sig  . diazepam (VALIUM) 5 MG tablet Take 5 mg by mouth daily as needed for anxiety.  . gabapentin (NEURONTIN) 300 MG capsule Take 300 mg by mouth 3 (three) times daily.  . Multiple Vitamins-Minerals (MULTIVITAMIN ADULT) TABS Take 1 tablet by mouth daily.  Marland Kitchen tiotropium (SPIRIVA) 18 MCG inhalation capsule Place 18 mcg into inhaler and inhale daily.  . traZODone (DESYREL) 50 MG tablet Take 100 mg by mouth at bedtime.  Marland Kitchen venlafaxine XR (EFFEXOR-XR) 150 MG 24 hr capsule Take 150 mg by mouth daily with breakfast.  . vitamin B-12 (CYANOCOBALAMIN) 1000  MCG tablet Take 1,000 mcg by mouth daily.  . vitamin E 400 UNIT capsule Take 400 Units by mouth daily.     FAMILY HISTORY:  Her has no family status information on file.    SOCIAL HISTORY: She  reports that she has been smoking Cigarettes.  She has been smoking about 2.00 packs per day. She does not have any smokeless tobacco history on file. She reports that she drinks alcohol.  REVIEW OF SYSTEMS:   Unable to obtain as the patient is on BiPAP  SUBJECTIVE:  Unable to obtain as the patient is on BiPAP  VITAL SIGNS: BP 104/75   Pulse (!) 125   Temp 98.8 F (37.1 C) (Oral)   Resp (!) 23   Ht 5\' 5"  (1.651 m)   Wt 49.9 kg (110 lb)   SpO2 100%   BMI 18.30 kg/m   HEMODYNAMICS:    VENTILATOR SETTINGS: FiO2 (%):  [65 %] 65 %  INTAKE / OUTPUT: No intake/output data recorded.  PHYSICAL EXAMINATION: General:  White , middle age female, noted to be moderate distress Neuro: Awake, alert, oriented, follows command, no focal deficits HEENT:  Atraumatic, normocephalic, no discharge Cardiovascular: s1s2, tachycardic Lungs:  Diffused wheezing, diminished bases, no crackles or rhonchi noted  Abdomen:  Soft, notender, non distended, active bowel sounds Musculoskeletal:  No inflammation/deformity noted Skin:  Grossly intact  LABS:  BMET  Recent Labs Lab 04/09/16 2118  NA 140  K 3.6  CL 101  CO2 31  BUN 5*  CREATININE 0.49  GLUCOSE 135*    Electrolytes  Recent Labs Lab 04/09/16 2118  CALCIUM 8.5*    CBC  Recent Labs Lab 04/09/16 1956  WBC 16.1*  HGB 13.2  HCT 38.5  PLT 265    Coag's No results for input(s): APTT, INR in the last 168 hours.  Sepsis Markers  Recent Labs Lab 04/09/16 1956  LATICACIDVEN 1.4    ABG  Recent Labs Lab 04/09/16 2022  PHART 7.30*  PCO2ART 65*  PO2ART 62*    Liver Enzymes  Recent Labs Lab 04/09/16 2118  AST 25  ALT 13*  ALKPHOS 77  BILITOT 0.7  ALBUMIN 3.6    Cardiac Enzymes  Recent Labs Lab  04/09/16 1956  TROPONINI <0.03    Glucose No results for input(s): GLUCAP in the last 168 hours.  Imaging Dg Chest Port 1 View  Result Date: 04/09/2016 CLINICAL DATA:  Initial evaluation for acute shortness of breath. EXAM: PORTABLE CHEST 1 VIEW COMPARISON:  Prior CT from 03/12/2016. FINDINGS: Cardiac and mediastinal silhouettes are within normal limits. Lungs are hyperinflated with prominent emphysematous changes. Associated upper lobe predominant parenchymal scarring is similar to previous. No focal infiltrates identified. No pulmonary edema or pleural effusion. No pneumothorax. No acute osseous abnormality. IMPRESSION: 1. Emphysema. 2. No other active cardiopulmonary disease identified. Electronically Signed   By: Jeannine Boga M.D.   On: 04/09/2016 20:53     STUDIES:  none  CULTURES: 04/09/16 UC>> 10/24 BC ANTIBIOTICS: 10/24Ceftriaxone>> 10/24 Azithromycin SIGNIFICANT EVENTS: 04/09/16 Patient admitted to the ICU for COPD Exacerbation, requiring Bipap  LINES/TUBES: none  DISCUSSION: 55 yo female admitted with cough and shortness of breath over a month admitted with COPD exacerbation/?CAP.  ASSESSMENT / PLAN:  PULMONARY A: Acute on chronic COPD exacerbation Severe Emphysema ?CAP( Given the Hx of cough and low grade temp.) Current tobacco abuse P:  Support with Bipap to keep O2 sats>92% Continue Bronchodilators Methyl prednisone, taper Smoking cessation councelling  CARDIOVASCULAR A:  No active issues P:  Continuous telemetry  RENAL A:   No active issues P:   Monitor I/o BMP intermittently Replace electrolytes per ICU protocol  GASTROINTESTINAL A:    Hx of  Idiopathic constipation P:   Diet as tolerated  HEMATOLOGIC A:   No active issues P:  Heparin for DVT prophylaxis  INFECTIOUS A:   Leukocytosis  ?CAP ?UTI P:   Monitor fever curve Follow cultures Antibiotics as above  ENDOCRINE A:   No active issues P:   BS checks with  BMP  NEUROLOGIC A:   No active issues P:   Resume home dose trazodone/ venlaflaxine  FAMILY  - Updates: Patient was updated regarding the plan of care  -   Bincy Varughese,AG-ACNP Pulmonary and [place]   04/09/2016, 10:52 PM  Merton Border, MD PCCM service Mobile [phone] Pager [phone] 04/10/2016

## 2016-04-09 NOTE — ED Provider Notes (Addendum)
[facility] Provider Note   CSN: [id number] Arrival date & time: 04/09/16  1948     History   Chief Complaint Chief Complaint  Patient presents with  . Shortness of Breath    HPI Christine Serrano is a 55 y.o. female hx of COPD, chronic smoker, Here presenting with shortness of breath. Patient has been coughing for the last month or so. Saw primary care doctor and was prescribed Levaquin and prednisone. She finished the antibiotic and prednisone several weeks ago and felt better initially but over the last week, her symptoms got worse. She went back to her primary care doctor and was prescribed another course of prednisone which she finished several days ago. She states that over the last 5-6 days, she noticed progressively worsening shortness of breath and cough. She states that the pocket is productive and is associated with some low-grade temperature about 100 at home. She still smokes and smoke one cigarette today. She called EMS and was noted to be very tight Neck and was given 15 mg albuterol and 1 mg atrovent, 125 mg solumedrol. Patient's oxygen level was mid 90s per EMS. Not on oxygen at home.    The history is provided by the patient.    Past Medical History:  Diagnosis Date  . Chronic idiopathic constipation   . COPD (chronic obstructive pulmonary disease) ([facility])     There are no active problems to display for this patient.   No past surgical history on file.  OB History    No data available       Home Medications    Prior to Admission medications   Medication Sig Start Date End Date Taking? Authorizing Provider  aspirin 81 MG chewable tablet Chew 81 mg by mouth daily.    Historical Provider, MD  cholecalciferol (VITAMIN D) 1000 units tablet Take 1,000 Units by mouth daily.    Historical Provider, MD  diazepam (VALIUM) 5 MG tablet Take 5 mg by mouth daily as needed for anxiety.    Historical Provider, MD  Fluticasone-Salmeterol (ADVAIR) 250-50  MCG/DOSE AEPB Inhale 1 puff into the lungs 2 (two) times daily.    Historical Provider, MD  gabapentin (NEURONTIN) 300 MG capsule Take 300 mg by mouth 3 (three) times daily.    Historical Provider, MD  lubiprostone (AMITIZA) 24 MCG capsule Take 24 mcg by mouth daily.    Historical Provider, MD  Multiple Vitamins-Minerals (MULTIVITAMIN ADULT) TABS Take 1 tablet by mouth daily.    Historical Provider, MD  tiotropium (SPIRIVA) 18 MCG inhalation capsule Place 18 mcg into inhaler and inhale daily.    Historical Provider, MD  traZODone (DESYREL) 50 MG tablet Take 100 mg by mouth at bedtime.    Historical Provider, MD  venlafaxine XR (EFFEXOR-XR) 150 MG 24 hr capsule Take 150 mg by mouth daily with breakfast.    Historical Provider, MD  vitamin B-12 (CYANOCOBALAMIN) 1000 MCG tablet Take 1,000 mcg by mouth daily.    Historical Provider, MD  vitamin E 100 UNIT capsule Take 100 Units by mouth daily.    Historical Provider, MD    Family History No family history on file.  Social History Social History  Substance Use Topics  . Smoking status: Current Every Day Smoker    Packs/day: 2.00    Types: Cigarettes  . Smokeless tobacco: Not on file  . Alcohol use Yes     Allergies   Imitrex [sumatriptan]   Review of Systems Review of Systems  Respiratory: Positive for  shortness of breath.   All other systems reviewed and are negative.    Physical Exam Updated Vital Signs BP 114/73   Pulse (!) 132   Temp 98.8 F (37.1 C) (Oral)   Resp (!) 24   Ht 5\' 5"  (1.651 m)   Wt 110 lb (49.9 kg)   SpO2 (!) 89%   BMI 18.30 kg/m   Physical Exam  Constitutional:  Tachycardic, tachypneic, uncomfortable   HENT:  Head: Normocephalic.  Eyes: EOM are normal. Pupils are equal, round, and reactive to light.  Neck: Normal range of motion. Neck supple.  Cardiovascular:  Tachycardic   Pulmonary/Chest:  Tachypneic, + diffuse wheezing, diminished L base   Abdominal: Soft. Bowel sounds are normal. She  exhibits no distension. There is no tenderness. There is no guarding.  Musculoskeletal: Normal range of motion. She exhibits no edema.  Neurological: She is alert.  Skin: Skin is warm.  Psychiatric: She has a normal mood and affect.  Nursing note and vitals reviewed.    ED Treatments / Results  Labs (all labs ordered are listed, but only abnormal results are displayed) Labs Reviewed  CBC WITH DIFFERENTIAL/PLATELET - Abnormal; Notable for the following:       Result Value   WBC 16.1 (*)    Neutro Abs 11.9 (*)    Monocytes Absolute 1.3 (*)    All other components within normal limits  URINALYSIS COMPLETEWITH MICROSCOPIC ([facility] ONLY) - Abnormal; Notable for the following:    Color, Urine STRAW (*)    APPearance CLEAR (*)    Ketones, ur TRACE (*)    Specific Gravity, Urine 1.002 (*)    Hgb urine dipstick 1+ (*)    Leukocytes, UA 2+ (*)    Squamous Epithelial / LPF 0-5 (*)    All other components within normal limits  BLOOD GAS, ARTERIAL - Abnormal; Notable for the following:    pH, Arterial 7.30 (*)    pCO2 arterial 65 (*)    pO2, Arterial 62 (*)    Bicarbonate 32.0 (*)    Acid-Base Excess 4.0 (*)    All other components within normal limits  COMPREHENSIVE METABOLIC PANEL - Abnormal; Notable for the following:    Glucose, Bld 135 (*)    BUN 5 (*)    Calcium 8.5 (*)    ALT 13 (*)    All other components within normal limits  CULTURE, BLOOD (ROUTINE X 2)  CULTURE, BLOOD (ROUTINE X 2)  URINE CULTURE  LACTIC ACID, PLASMA  TROPONIN I  LACTIC ACID, PLASMA    EKG  EKG Interpretation None      ED ECG REPORT I, Wandra Arthurs, the attending physician, personally viewed and interpreted this ECG.   Date: 04/09/2016  EKG Time: 20:04 pm  Rate: 144  Rhythm: sinus tachycardia  Axis: normal  Intervals:none  ST&T Change: nonspecific    Radiology Dg Chest Port 1 View  Result Date: 04/09/2016 CLINICAL DATA:  Initial evaluation for acute shortness of breath. EXAM: PORTABLE  CHEST 1 VIEW COMPARISON:  Prior CT from 03/12/2016. FINDINGS: Cardiac and mediastinal silhouettes are within normal limits. Lungs are hyperinflated with prominent emphysematous changes. Associated upper lobe predominant parenchymal scarring is similar to previous. No focal infiltrates identified. No pulmonary edema or pleural effusion. No pneumothorax. No acute osseous abnormality. IMPRESSION: 1. Emphysema. 2. No other active cardiopulmonary disease identified. Electronically Signed   By: Jeannine Boga M.D.   On: 04/09/2016 20:53    Procedures Procedures (including critical care time)  CRITICAL CARE Performed by: Wandra Arthurs   Total critical care time: 30 minutes  Critical care time was exclusive of separately billable procedures and treating other patients.  Critical care was necessary to treat or prevent imminent or life-threatening deterioration.  Critical care was time spent personally by me on the following activities: development of treatment plan with patient and/or surrogate as well as nursing, discussions with consultants, evaluation of patient's response to treatment, examination of patient, obtaining history from patient or surrogate, ordering and performing treatments and interventions, ordering and review of laboratory studies, ordering and review of radiographic studies, pulse oximetry and re-evaluation of patient's condition.   Medications Ordered in ED Medications  sodium chloride 0.9 % bolus 1,000 mL (0 mLs Intravenous Stopped 04/09/16 2057)    And  sodium chloride 0.9 % bolus 500 mL (0 mLs Intravenous Stopped 04/09/16 2150)  magnesium sulfate IVPB 2 g 50 mL (0 g Intravenous Stopped 04/09/16 2115)  albuterol (PROVENTIL) (2.5 MG/3ML) 0.083% nebulizer solution 10 mg (10 mg Nebulization Given 04/09/16 2006)  azithromycin (ZITHROMAX) 500 mg in dextrose 5 % 250 mL IVPB (500 mg Intravenous New Bag/Given 04/09/16 2109)  cefTRIAXone (ROCEPHIN) IVPB 1 g (1 g Intravenous  Given 04/09/16 2119)     Initial Impression / Assessment and Plan / ED Course  I have reviewed the triage vital signs and the nursing notes.  Pertinent labs & imaging results that were available during my care of the patient were reviewed by me and considered in my medical decision making (see chart for details).  Clinical Course    JANALEE PROFFER is a 55 y.o. female here with SOB, wheezing. Likely COPD exacerbation. Hypoxic to 80% on RA, not on oxygen. Low grade temp at home. Tachycardic as well. Given solumedrol, albuterol by EMS. Will do sepsis workup, ABG. Will give magnesium, more albuterol. Will admit.   10:09 PM ABG showed pH 7.3, CO2 60. Remained hypoxic to 80-85% on 4 L [place]. Getting more tired. I placed patient on bipap. WBC 16. CXR clear. Given rocephin, azithro. Will admit to stepdown.   10:17 PM Discussed with hospitalist. Since patient is on bipap, will admit to ICU under Dr. Alphonsus Sias.    Final Clinical Impressions(s) / ED Diagnoses   Final diagnoses:  None    New Prescriptions New Prescriptions   No medications on file     Drenda Freeze, MD 04/09/16 Greenlee Yao, MD 04/09/16 2219

## 2016-04-09 NOTE — ED Notes (Signed)
Attempted to place pt on 4L by nasal canula. Pt sats only up to 85-86% as pt starting to get sleepy. When pt alert and talking, sats 88-89% on 4L. MD notified and order put in for BiPap. Kim in respiratory called to put pt on BiPap.

## 2016-04-09 NOTE — Progress Notes (Signed)
Pharmacy Antibiotic Note  VALENTYNA HAHNE is a 55 y.o. female admitted on 04/09/2016 with pneumonia.  Pharmacy has been consulted for ceftriaxone dosing.  Plan: Ceftriaxone 1 gm IV Q24H  Height: 5\' 5"  (165.1 cm) Weight: 110 lb (49.9 kg) IBW/kg (Calculated) : 57  Temp (24hrs), Avg:98.8 F (37.1 C), Min:98.8 F (37.1 C), Max:98.8 F (37.1 C)   Recent Labs Lab 04/09/16 1956 04/09/16 2118  WBC 16.1*  --   CREATININE  --  0.49  LATICACIDVEN 1.4  --     Estimated Creatinine Clearance: 61.1 mL/min (by C-G formula based on SCr of 0.49 mg/dL).    Allergies  Allergen Reactions  . Imitrex [Sumatriptan] Other (See Comments)    Worsens migraine     Thank you for allowing pharmacy to be a part of this patient's care.  Laural Benes, Pharm.D., BCPS Clinical Pharmacist 04/09/2016 11:47 PM

## 2016-04-10 ENCOUNTER — Encounter: Payer: Self-pay | Admitting: *Deleted

## 2016-04-10 DIAGNOSIS — J441 Chronic obstructive pulmonary disease with (acute) exacerbation: Principal | ICD-10-CM

## 2016-04-10 DIAGNOSIS — J9622 Acute and chronic respiratory failure with hypercapnia: Secondary | ICD-10-CM

## 2016-04-10 DIAGNOSIS — J9621 Acute and chronic respiratory failure with hypoxia: Secondary | ICD-10-CM

## 2016-04-10 LAB — BASIC METABOLIC PANEL
ANION GAP: 5 (ref 5–15)
BUN: 5 mg/dL — ABNORMAL LOW (ref 6–20)
CO2: 33 mmol/L — AB (ref 22–32)
Calcium: 8.3 mg/dL — ABNORMAL LOW (ref 8.9–10.3)
Chloride: 101 mmol/L (ref 101–111)
Creatinine, Ser: 0.44 mg/dL (ref 0.44–1.00)
GFR calc Af Amer: >60 mL/min (ref >60)
GFR calc non Af Amer: >60 mL/min (ref >60)
GLUCOSE: 187 mg/dL — AB (ref 65–99)
POTASSIUM: 4.4 mmol/L (ref 3.5–5.1)
Sodium: 139 mmol/L (ref 135–145)

## 2016-04-10 LAB — CBC
HEMATOCRIT: 34.7 % — AB (ref 35.0–47.0)
Hemoglobin: 12 g/dL (ref 12.0–16.0)
MCH: 30.9 pg (ref 26.0–34.0)
MCHC: 34.6 g/dL (ref 32.0–36.0)
MCV: 89.3 fL (ref 80.0–100.0)
Platelets: 206 10*3/uL (ref 150–440)
RBC: 3.88 MIL/uL (ref 3.80–5.20)
RDW: 14 % (ref 11.5–14.5)
WBC: 12 10*3/uL — AB (ref 3.6–11.0)

## 2016-04-10 LAB — PHOSPHORUS: Phosphorus: 3.5 mg/dL (ref 2.5–4.6)

## 2016-04-10 LAB — MAGNESIUM: Magnesium: 2.2 mg/dL (ref 1.7–2.4)

## 2016-04-10 LAB — GLUCOSE, CAPILLARY: GLUCOSE-CAPILLARY: 192 mg/dL — AB (ref 65–99)

## 2016-04-10 LAB — MRSA PCR SCREENING: MRSA BY PCR: NEGATIVE

## 2016-04-10 MED ORDER — ORAL CARE MOUTH RINSE: 15.0000 mL | Freq: Two times a day (BID) | OROMUCOSAL | Status: DC

## 2016-04-10 MED ORDER — METHYLPREDNISOLONE SODIUM SUCC 40 MG IJ SOLR
40.0000 mg | Freq: Two times a day (BID) | INTRAMUSCULAR | Status: DC
  Administered 2016-04-10 – 2016-04-11 (×2): 40 mg via INTRAVENOUS
  Filled 2016-04-10 (×2): qty 1

## 2016-04-10 MED ORDER — ORAL CARE MOUTH RINSE
15.0000 mL | Freq: Two times a day (BID) | OROMUCOSAL | Status: DC
  Administered 2016-04-10 (×2): 15 mL via OROMUCOSAL

## 2016-04-10 MED ORDER — LUBIPROSTONE 24 MCG PO CAPS: 24.0000 ug | ORAL_CAPSULE | Freq: Every evening | ORAL | Status: DC

## 2016-04-10 MED ORDER — CHLORHEXIDINE GLUCONATE 0.12 % MT SOLN: 15.0000 mL | Freq: Two times a day (BID) | OROMUCOSAL | Status: DC

## 2016-04-10 MED ORDER — NICOTINE 21 MG/24HR TD PT24
21.0000 mg | MEDICATED_PATCH | Freq: Every day | TRANSDERMAL | Status: DC
  Administered 2016-04-10 – 2016-04-11 (×2): 21 mg via TRANSDERMAL
  Filled 2016-04-10 (×2): qty 1

## 2016-04-10 MED ORDER — LINACLOTIDE 290 MCG PO CAPS
290.0000 ug | ORAL_CAPSULE | Freq: Every day | ORAL | Status: DC
  Administered 2016-04-10 – 2016-04-11 (×2): 290 ug via ORAL
  Filled 2016-04-10 (×2): qty 1

## 2016-04-10 MED ORDER — IPRATROPIUM-ALBUTEROL 0.5-2.5 (3) MG/3ML IN SOLN
3.0000 mL | Freq: Four times a day (QID) | RESPIRATORY_TRACT | Status: DC
  Administered 2016-04-10 – 2016-04-11 (×4): 3 mL via RESPIRATORY_TRACT
  Filled 2016-04-10 (×4): qty 3

## 2016-04-10 MED ORDER — LUBIPROSTONE 24 MCG PO CAPS
24.0000 ug | ORAL_CAPSULE | Freq: Two times a day (BID) | ORAL | Status: DC
  Administered 2016-04-10: 24 ug via ORAL
  Filled 2016-04-10: qty 1

## 2016-04-10 MED ORDER — ALBUTEROL SULFATE (2.5 MG/3ML) 0.083% IN NEBU: 2.5000 mg | INHALATION_SOLUTION | RESPIRATORY_TRACT | Status: DC | PRN

## 2016-04-10 NOTE — Progress Notes (Signed)
Feels that she is improving. Still remains dyspneic with minimal exertion. No distress @ rest.   Vitals:   04/10/16 0630 04/10/16 0751 04/10/16 0800 04/10/16 0900  BP: 113/81  117/70 (!) 104/59  Pulse: (!) 114  (!) 106 95  Resp: 15  (!) 21 (!) 25  Temp:    97.8 F (36.6 C)  TempSrc:    Oral  SpO2: 97% 96% 98% 97%  Weight:      Height:       Appears older than true age NAD HEENT WNL No JVD Diminished BS, distant wheezes throughout Reg, No M Soft, NT, +BS No C/C/E No focal neuro deficits  BMP Latest Ref Rng & Units 04/10/2016 04/09/2016 09/28/2015  Glucose 65 - 99 mg/dL 187(H) 135(H) 98  BUN 6 - 20 mg/dL 5(L) 5(L) 7  Creatinine 0.44 - 1.00 mg/dL 0.44 0.49 0.58  Sodium 135 - 145 mmol/L 139 140 136  Potassium 3.5 - 5.1 mmol/L 4.4 3.6 4.5  Chloride 101 - 111 mmol/L 101 101 97(L)  CO2 22 - 32 mmol/L 33(H) 31 29  Calcium 8.9 - 10.3 mg/dL 8.3(L) 8.5(L) 9.1   CBC Latest Ref Rng & Units 04/10/2016 04/09/2016 09/28/2015  WBC 3.6 - 11.0 K/uL 12.0(H) 16.1(H) 9.5  Hemoglobin 12.0 - 16.0 g/dL 12.0 13.2 17.6(H)  Hematocrit 35.0 - 47.0 % 34.7(L) 38.5 49.2(H)  Platelets 150 - 440 K/uL 206 265 204   CXR: hyperinflated, chronic BUL changes, diffusely accentuated interstitial markings, NAD  IMPRESSION: 1) Smoker 2) Severe COPD, O2 dependent PTA 3) Acute on chronic respiratory failure 4) AECOPD - Improving 5) Steroid induced hyperglycemia  PLAN: 1) Nicotine patch 2) Steroid dose adjusted 3) Cont PRN BiPAP 4) cont supplemental O2 5) Cont nebulized bronchodilators - regimen adjusted 6) Cont empiric antibiotics 7) counseled re: smoking cessation 8) She will need Pulmonary follow up in 2-3 weeks after discharge. I will arrange  Merton Border, MD PCCM service Mobile [phone] Pager [phone] 04/10/2016

## 2016-04-10 NOTE — Plan of Care (Signed)
Problem: Health Behavior/Discharge Planning: Goal: Ability to manage health-related needs will improve Outcome: Progressing Patient able to be titrated down to home dose of O2 (2 L nasal cannula), off Bipap all shift, and up to [facility] several times during shift with only standby assistance.

## 2016-04-10 NOTE — Plan of Care (Signed)
Problem: Health Behavior/Discharge Planning: Goal: Ability to manage health-related needs will improve Outcome: Progressing Patient had requested smoking cessation, handout given and verbal instruction given. Reviewed plan with RN and patient involved roommate in plan.

## 2016-04-10 NOTE — Care Management (Signed)
Patient was very welcoming and seemed warm this morning. She remarked, "its never a bad time for God" as we shared and I checked on her. I let her know that if she needed anything that we are here 24/7 and that we do have women on staff that can be a sounding board if she would rather speak to a woman. She did not want prayer right now, but was kind and comfortable while in conversation. We will follow up as a team later.

## 2016-04-11 ENCOUNTER — Telehealth: Payer: Self-pay | Admitting: Pulmonary Disease

## 2016-04-11 DIAGNOSIS — F17200 Nicotine dependence, unspecified, uncomplicated: Secondary | ICD-10-CM

## 2016-04-11 LAB — URINE CULTURE

## 2016-04-11 MED ORDER — PREDNISONE 20 MG PO TABS: 40.0000 mg | ORAL_TABLET | Freq: Every day | ORAL | 0 refills | Status: DC

## 2016-04-11 MED ORDER — DOXYCYCLINE HYCLATE 50 MG PO CAPS: 100.0000 mg | ORAL_CAPSULE | Freq: Two times a day (BID) | ORAL | 0 refills | Status: DC

## 2016-04-11 MED ORDER — PREDNISONE 20 MG PO TABS: 40.0000 mg | ORAL_TABLET | Freq: Every day | ORAL | 0 refills | Status: AC

## 2016-04-11 MED ORDER — NICOTINE 21 MG/24HR TD PT24: 21.0000 mg | MEDICATED_PATCH | Freq: Every day | TRANSDERMAL | 0 refills | Status: DC

## 2016-04-11 NOTE — Discharge Summary (Signed)
Physician Discharge Summary  Patient ID: Christine Serrano MRN: [id number] DOB/AGE: 57-04-1961 57 y.o.  Admit date: Apr 25, 2016 Discharge date: 04/11/2016    Discharge Diagnoses:        Current Smoker       Severe COPD, O2 dependent PTA       AECOPD-improving       Steroid induced hyperglycemia       Chronic idiopathic constipation                                                                               DISCHARGE PLAN BY DIAGNOSIS    Current Smoker Plan:   Nicotine patch daily Counseling for smoking cessation   Severe COPD, O2 dependent PTA AECOPD-improving  Plan: Prednisone 40 mg daily for 5 days Continue nebulized bronchodilators Continue empiric antibiotics Doxycycline 100 mg by mouth bid for 5 days Continue prn Bipap Pulmonary follow-up in 2-3 weeks Continue supplemental via nasal canula at 2L as needed for dyspnea  Chronic idiopathic constipation Plan: Continue outpatient regimen                DISCHARGE SUMMARY   Christine Serrano is a 55 y.o. y/o female with a PMH of COPD, Current smoker (2-3 PPD for 40 yrs) and Chronic idiopathic constipation.   According to ER notes she stated she has been suffering from a persistent cough onset 1 month ago.  She saw her PCP due to the cough and was prescribed levaquin and prednisone. She presented to [facility] ER 2016/04/25 with c/o worsening shortness of breath onset 1 week prior to presentation to ER, low grade temp of 100 degrees F and c/o feeling like her neck was tight, therefore she notified EMS.  Upon EMS arrival she was given 15 mg albuterol, 1 mg of atrovent, and 125 mg of solumedrol.  However, she continued to have respiratory distress upon arrival to the ER her O2 sats were 85-86% on nasal canula therefore she was placed on BIPAP.  SIGNIFICANT DIAGNOSTIC STUDIES None  SIGNIFICANT EVENTS 2016/04/25-Patient admitted to the ICU for COPD Exacerbation, requiring Bipap  MICRO DATA  Urine 10/24>>negative 10/26 Blood  10/24>>negative  ANTIBIOTICS Ceftriaxone 10/24>>10/26 Azithromycin 04/26/2023>>10/25  CONSULTS Pulmonology  TUBES / LINES PIV's x2 10/24>>10/26  Discharge Exam: General: chronically ill appearing Caucasian female, resting in bed, in NAD. Neuro: A&O x 3, non-focal.  HEENT: [place]/AT. PERRL, sclerae anicteric. Cardiovascular: sinus tach, s1s2, RRR, no M/R/G.  Lungs: expiratory wheezes throughout, respirations even and unlabored.   Abdomen: BS x 4, soft, NT/ND.  Musculoskeletal: No gross deformities, no edema.  Skin: Intact, warm, no rashes.   Vitals:   04/11/16 0300 04/11/16 0400 04/11/16 0500 04/11/16 0600  BP: (!) 99/58 (!) 86/57 115/63 121/79  Pulse: 85 95 93 89  Resp: '18 19 18 18  '$ Temp:  97.9 F (36.6 C)    TempSrc:  Oral    SpO2: 96% 95% 97% 99%  Weight:   109 lb 5.6 oz (49.6 kg)   Height:         Discharge Labs  BMET  Recent Labs Lab 04/25/2016 2118 04/10/16 0254  NA 140 139  K 3.6 4.4  CL 101 101  CO2 31  33*  GLUCOSE 135* 187*  BUN 5* 5*  CREATININE 0.49 0.44  CALCIUM 8.5* 8.3*  MG  --  2.2  PHOS  --  3.5    CBC  Recent Labs Lab 04/09/16 1956 04/10/16 0254  HGB 13.2 12.0  HCT 38.5 34.7*  WBC 16.1* 12.0*  PLT 265 206    Anti-Coagulation No results for input(s): INR in the last 168 hours.          Medication List    TAKE these medications   aspirin EC 81 MG tablet Take 81 mg by mouth daily.   cyproheptadine 4 MG tablet Commonly known as:  PERIACTIN Take 4 mg by mouth 2 (two) times daily.   diazepam 5 MG tablet Commonly known as:  VALIUM Take 5 mg by mouth daily as needed for anxiety.   doxycycline 50 MG capsule Commonly known as:  VIBRAMYCIN Take 2 capsules (100 mg total) by mouth 2 (two) times daily.   gabapentin 300 MG capsule Commonly known as:  NEURONTIN Take 300 mg by mouth 3 (three) times daily.   LINZESS 290 MCG Caps capsule Generic drug:  linaclotide Take 290 mcg by mouth daily.   MULTIVITAMIN ADULT Tabs Take 1  tablet by mouth daily.   nicotine 21 mg/24hr patch Commonly known as:  NICODERM CQ - dosed in mg/24 hours Place 1 patch (21 mg total) onto the skin daily.   predniSONE 20 MG tablet Commonly known as:  DELTASONE Take 2 tablets (40 mg total) by mouth daily with breakfast.   SYMBICORT 160-4.5 MCG/ACT inhaler Generic drug:  budesonide-formoterol Inhale 2 puffs into the lungs 2 (two) times daily.   tiotropium 18 MCG inhalation capsule Commonly known as:  SPIRIVA Place 18 mcg into inhaler and inhale daily.   traZODone 50 MG tablet Commonly known as:  DESYREL Take 100 mg by mouth at bedtime.   venlafaxine XR 150 MG 24 hr capsule Commonly known as:  EFFEXOR-XR Take 150 mg by mouth daily with breakfast.   VENTOLIN HFA 108 (90 Base) MCG/ACT inhaler Generic drug:  albuterol Inhale 2 puffs into the lungs every 6 (six) hours as needed for shortness of breath.   vitamin B-12 1000 MCG tablet Commonly known as:  CYANOCOBALAMIN Take 1,000 mcg by mouth daily.   Vitamin D 2000 units tablet Take 2,000 Units by mouth daily.   vitamin E 400 UNIT capsule Take 400 Units by mouth daily.        Disposition: Pt stable to discharge home on prn O2 at 2L for dyspnea  Discharged Condition: Christine Serrano has met maximum benefit of inpatient care and is medically stable and cleared for discharge.  Patient is pending follow up as above.      Marda Stalker, [place] Pager [phone] (please enter 7 digits) PCCM Consult Pager [phone] (please enter 7 digits)  I saw pt and examined her this morning. The above discharge summary has been reviewed and the plan is as outlined above. I will arrange for follow up with me in the office in 2-3 weeks. We again discussed the imperative of smoking cessation and strategies to help her achieve these goals.  Merton Border, MD PCCM service Mobile [phone] Pager [phone] 04/11/2016

## 2016-04-11 NOTE — Telephone Encounter (Signed)
LMOV x home and cell patient needs hfu with Dr. Alva Garnet in 3-4 weeks .  appt notes "office spiro" as patient needs in office spirometry .

## 2016-04-11 NOTE — Progress Notes (Signed)
Pt leaving at this time via wheelchair w/ her friend and hospital volunteer accompanying. Pt's room inspected as she was leaving. No personal belongings were left behind. [place]

## 2016-04-11 NOTE — Progress Notes (Signed)
D/C instructions reviewed w/ this pt and signed by the pt and this Probation officer. She verbalized that she had no additional questions and her understanding on instructions, which included her follow-up appointment information. Pt is currently waiting for her ride, who is coming from [place]. Pt will be discharged via wheelchair and her home oxygen with a friend. [place]

## 2016-04-14 LAB — CULTURE, BLOOD (ROUTINE X 2)
CULTURE: NO GROWTH
Culture: NO GROWTH

## 2016-04-15 NOTE — Telephone Encounter (Signed)
Attempted to schedule hfu appt.  lmov to call office for scheduling.

## 2016-04-21 DIAGNOSIS — J449 Chronic obstructive pulmonary disease, unspecified: Secondary | ICD-10-CM | POA: Diagnosis not present

## 2016-05-13 ENCOUNTER — Encounter: Payer: Self-pay | Admitting: Pulmonary Disease

## 2016-05-13 ENCOUNTER — Ambulatory Visit (INDEPENDENT_AMBULATORY_CARE_PROVIDER_SITE_OTHER): Payer: Commercial Managed Care - HMO | Admitting: Pulmonary Disease

## 2016-05-13 VITALS — BP 128/78 | HR 110 | Ht 65.0 in | Wt 114.6 lb

## 2016-05-13 DIAGNOSIS — F17200 Nicotine dependence, unspecified, uncomplicated: Secondary | ICD-10-CM

## 2016-05-13 DIAGNOSIS — J439 Emphysema, unspecified: Secondary | ICD-10-CM

## 2016-05-13 DIAGNOSIS — J449 Chronic obstructive pulmonary disease, unspecified: Secondary | ICD-10-CM

## 2016-05-13 MED ORDER — VARENICLINE TARTRATE 1 MG PO TABS: 1.0000 mg | ORAL_TABLET | Freq: Two times a day (BID) | ORAL | 5 refills | Status: DC

## 2016-05-13 MED ORDER — VARENICLINE TARTRATE 0.5 MG X 11 & 1 MG X 42 PO MISC: ORAL | 0 refills | Status: DC

## 2016-05-13 NOTE — Patient Instructions (Addendum)
1) You must quit smoking if we are going to make any progress with your very severe lung disease 2) I have prescribed Chantix - starter pak and continuation pak 3) referral to pulmonary rehab program 4) Hopefully, if we can get you off of cigarettes, we can look to possible lung transplantation in the future  Continue your medications as prescribed at the time of your discharge  Follow up in 3 months

## 2016-05-13 NOTE — Progress Notes (Signed)
PULMONARY POST HOSPITAL FOLLOW UP  PT PROFILE: 31 F recalcitrant smoker with severe, oxygen-dependent COPD hospitalized 10/24-10/26/17 for COPD exacerbation initially requiring NIPPV  DATA: CT chest 03/13/16: Severe emphysema with extensive disease in the upper lobes and superior segment of the right lower lobe. This could represent acute and/or chronic changes. In addition, there is an irregular spiculated density along the right minor fissure which is indeterminate. Recommend 2-3 month follow-up chest CT   SUBJ: I first met her in hospital recently for AECOPD. She recovered reapidly and was discharged after 2 days. She continues to smoke 1 PPD despite chronic O2 therapy. She was first diagnosed with COPD by Dr Annamaria Boots approx 10 yrs ago and was first prescribed O2 in 2010 after a previous hospitalization for AECOPD. She now feels much better than she did at the time of hospitalization and is back to her baseline of class III/IV dyspnea. Denies CP, fever, purulent sputum, hemoptysis, LE edema and calf tenderness  OBJ: Vitals:   05/13/16 1054  BP: 128/78  Pulse: (!) 110  SpO2: 90%  Weight: 114 lb 9.6 oz (52 kg)  Height: '5\' 5"'$  (1.651 m)    Gen: Thin, NAD HEENT: All WNL Neck: NO LAN, no JVD noted Lungs: decreased BS, normal percussion note, minimal scattered Cardiovascular: Reg, no M noted Abdomen: Soft, NT +BS Ext: no C/C/E Neuro: grossly intact Skin: No lesions noted  CXR (04/09/16): hyperinflated, NAD  IMPRESSION: Severe COPD/emphysema Chronic hypoxic respiratory failure Recalcitrant smoker Irregular RML opacity noted on CT chest 03/12/16 - doubt malignancy  Will order CXR next visit and decide on possible repeat CT chest  PLAN: 1) Smoking cessation was discussed in detail including the risks associated with continued smoking, the benefits of smoking cessation and strategies that might be helpful. After thorough consideration, we have decided on Chantix 2) Continue Symbicort,  Spiriva and PRN albuterol 3) Referral to pulmonary rehab 4) Long term goal of possible referral for lung transplantation discussed with her 5) ROV 3 months  Merton Border, MD PCCM service Mobile [phone] Pager [phone] 05/13/2016

## 2016-05-21 DIAGNOSIS — J449 Chronic obstructive pulmonary disease, unspecified: Secondary | ICD-10-CM | POA: Diagnosis not present

## 2016-06-11 ENCOUNTER — Encounter: Payer: Commercial Managed Care - HMO | Attending: Pulmonary Disease | Admitting: Respiratory Therapy

## 2016-06-11 VITALS — Ht 64.9 in | Wt 117.6 lb

## 2016-06-11 DIAGNOSIS — J449 Chronic obstructive pulmonary disease, unspecified: Secondary | ICD-10-CM | POA: Insufficient documentation

## 2016-06-11 NOTE — Patient Instructions (Signed)
Patient Instructions  Patient Details  Name: Christine Serrano MRN: [id number] Date of Birth: 06/13/1961 Referring Provider:  Wilhelmina Mcardle, MD  Below are the personal goals you chose as well as exercise and nutrition goals. Our goal is to help you keep on track towards obtaining and maintaining your goals. We will be discussing your progress on these goals with you throughout the program.  Initial Exercise Prescription:     Initial Exercise Prescription - 06/11/16 1400      Date of Initial Exercise RX and Referring Provider   Date 06/11/16   Referring Provider Merton Border MD     Oxygen   Oxygen Continuous   Liters 3     Treadmill   MPH 1.5   Grade 0   Minutes 15  3 min x5   METs 2.15     NuStep   Level 2   Watts --  60-80 spm   Minutes 15   METs 2     REL-XR   Level 1   Watts --  speed 40-50   Minutes 15   METs 2     Prescription Details   Frequency (times per week) 3   Duration Progress to 45 minutes of aerobic exercise without signs/symptoms of physical distress     Intensity   THRR 40-80% of Max Heartrate 136-154   Ratings of Perceived Exertion 11-15   Perceived Dyspnea 0-4     Progression   Progression Continue to progress workloads to maintain intensity without signs/symptoms of physical distress.     Resistance Training   Training Prescription Yes   Weight 2 lbs   Reps 10-15      Exercise Goals: Frequency: Be able to perform aerobic exercise three times per week working toward 3-5 days per week.  Intensity: Work with a perceived exertion of 11 (fairly light) - 15 (hard) as tolerated. Follow your new exercise prescription and watch for changes in prescription as you progress with the program. Changes will be reviewed with you when they are made.  Duration: You should be able to do 30 minutes of continuous aerobic exercise in addition to a 5 minute warm-up and a 5 minute cool-down routine.  Nutrition Goals: Your personal nutrition goals  will be established when you do your nutrition analysis with the dietician.  The following are nutrition guidelines to follow: Cholesterol < 200mg /day Sodium < 1500mg /day Fiber: Men over 50 yrs - 30 grams per day  Personal Goals:     Personal Goals and Risk Factors at Admission - 06/11/16 1452      Core Components/Risk Factors/Patient Goals on Admission    Weight Management Yes;Weight Maintenance;Weight Gain   Intervention Weight Management: Develop a combined nutrition and exercise program designed to reach desired caloric intake, while maintaining appropriate intake of nutrient and fiber, sodium and fats, and appropriate energy expenditure required for the weight goal.;Weight Management: Provide education and appropriate resources to help participant work on and attain dietary goals.   Admit Weight 117 lb 9.6 oz (53.3 kg)   Goal Weight: Short Term 120 lb (54.4 kg)   Goal Weight: Long Term 122 lb (55.3 kg)   Expected Outcomes Weight Gain: Understanding of general recommendations for a high calorie, high protein meal plan that promotes weight gain by distributing calorie intake throughout the day with the consumption for 4-5 meals, snacks, and/or supplements;Short Term: Continue to assess and modify interventions until short term weight is achieved;Long Term: Adherence to nutrition and physical activity/exercise program  aimed toward attainment of established weight goal   Sedentary Yes   Intervention Provide advice, education, support and counseling about physical activity/exercise needs.;Develop an individualized exercise prescription for aerobic and resistive training based on initial evaluation findings, risk stratification, comorbidities and participant's personal goals.   Expected Outcomes Achievement of increased cardiorespiratory fitness and enhanced flexibility, muscular endurance and strength shown through measurements of functional capacity and personal statement of participant.    Increase Strength and Stamina Yes   Intervention Provide advice, education, support and counseling about physical activity/exercise needs.;Develop an individualized exercise prescription for aerobic and resistive training based on initial evaluation findings, risk stratification, comorbidities and participant's personal goals.   Expected Outcomes Achievement of increased cardiorespiratory fitness and enhanced flexibility, muscular endurance and strength shown through measurements of functional capacity and personal statement of participant.   Improve shortness of breath with ADL's Yes   Intervention Provide education, individualized exercise plan and daily activity instruction to help decrease symptoms of SOB with activities of daily living.   Expected Outcomes Short Term: Achieves a reduction of symptoms when performing activities of daily living.   Develop more efficient breathing techniques such as purse lipped breathing and diaphragmatic breathing; and practicing self-pacing with activity Yes   Intervention Provide education, demonstration and support about specific breathing techniuqes utilized for more efficient breathing. Include techniques such as pursed lipped breathing, diaphragmatic breathing and self-pacing activity.   Expected Outcomes Short Term: Participant will be able to demonstrate and use breathing techniques as needed throughout daily activities.   Increase knowledge of respiratory medications and ability to use respiratory devices properly  Yes   Intervention Provide education and demonstration as needed of appropriate use of medications, inhalers, and oxygen therapy.  Ventolin, Symbicort, Spiriva MDI's; Duoneb; Oxygen 2l/m   Expected Outcomes Short Term: Achieves understanding of medications use. Understands that oxygen is a medication prescribed by physician. Demonstrates appropriate use of inhaler and oxygen therapy.      Tobacco Use Initial Evaluation: History  Smoking  Status   Current Every Day Smoker   Packs/day: 2.00   Types: Cigarettes  Smokeless Tobacco   Never Used    Copy of goals given to participant.

## 2016-06-11 NOTE — Progress Notes (Signed)
Pulmonary Individual Treatment Plan  Patient Details  Name: Christine Serrano MRN: [id number] Date of Birth: 06/04/1961 Referring Provider:   Flowsheet Row Pulmonary Rehab from 06/11/2016 in [facility] Cardiac and Pulmonary Rehab  Referring Provider  Merton Border MD      Initial Encounter Date:  Flowsheet Row Pulmonary Rehab from 06/11/2016 in [facility] Cardiac and Pulmonary Rehab  Date  06/11/16  Referring Provider  Merton Border MD      Visit Diagnosis: COPD, severe ([place])  Patient's Home Medications on Admission:  Current Outpatient Prescriptions:    aspirin EC 81 MG tablet, Take 81 mg by mouth daily., Disp: , Rfl:    Cholecalciferol (VITAMIN D) 2000 units tablet, Take 2,000 Units by mouth daily., Disp: , Rfl:    cyproheptadine (PERIACTIN) 4 MG tablet, Take 4 mg by mouth 2 (two) times daily., Disp: , Rfl:    diazepam (VALIUM) 5 MG tablet, Take 5 mg by mouth daily as needed for anxiety., Disp: , Rfl:    gabapentin (NEURONTIN) 300 MG capsule, Take 300 mg by mouth 3 (three) times daily., Disp: , Rfl:    ipratropium-albuterol (DUONEB) 0.5-2.5 (3) MG/3ML SOLN, , Disp: , Rfl:    LINZESS 290 MCG CAPS capsule, Take 290 mcg by mouth daily., Disp: , Rfl:    Multiple Vitamins-Minerals (MULTIVITAMIN ADULT) TABS, Take 1 tablet by mouth daily., Disp: , Rfl:    SYMBICORT 160-4.5 MCG/ACT inhaler, Inhale 2 puffs into the lungs 2 (two) times daily., Disp: , Rfl:    tiotropium (SPIRIVA) 18 MCG inhalation capsule, Place 18 mcg into inhaler and inhale daily., Disp: , Rfl:    traZODone (DESYREL) 50 MG tablet, Take 100 mg by mouth at bedtime., Disp: , Rfl:    varenicline (CHANTIX CONTINUING MONTH PAK) 1 MG tablet, Take 1 tablet (1 mg total) by mouth 2 (two) times daily., Disp: 60 tablet, Rfl: 5   varenicline (CHANTIX PAK) 0.5 MG X 11 & 1 MG X 42 tablet, Take one 0.5 mg tablet by mouth once daily for 3 days, then increase to one 0.5 mg tablet twice daily for 4 days, then increase to one 1 mg tablet  twice daily., Disp: 53 tablet, Rfl: 0   venlafaxine XR (EFFEXOR-XR) 150 MG 24 hr capsule, Take 150 mg by mouth daily with breakfast., Disp: , Rfl:    VENTOLIN HFA 108 (90 Base) MCG/ACT inhaler, Inhale 2 puffs into the lungs every 6 (six) hours as needed for shortness of breath., Disp: , Rfl:    vitamin B-12 (CYANOCOBALAMIN) 1000 MCG tablet, Take 1,000 mcg by mouth daily., Disp: , Rfl:    vitamin E 400 UNIT capsule, Take 400 Units by mouth daily. , Disp: , Rfl:   Past Medical History: Past Medical History:  Diagnosis Date   Chronic idiopathic constipation    COPD (chronic obstructive pulmonary disease) (HCC)     Tobacco Use: History  Smoking Status   Current Every Day Smoker   Packs/day: 2.00   Types: Cigarettes  Smokeless Tobacco   Never Used    Labs: Recent Review Flowsheet Data    Labs for ITP Cardiac and Pulmonary Rehab Latest Ref Rng & Units 04/04/2009 04/09/2016   PHART 7.350 - 7.450 7.302(L) 7.30(L)   PCO2ART 32.0 - 48.0 mmHg 59.5 CRITICAL RESULT CALLED TO, READ BACK BY AND VERIFIED WITH: [facility] 04/04/09 AT 1440 CRITICAL RESULT CALLED TO, READ BACK BY AND VERIFIED WITH: [facility] 04/04/09 AT 1440 BY BROADNAX,L.RRT(HH) 65(H)   HCO3 20.0 - 28.0 mmol/L 28.5(H) 32.0(H)  TCO2 0 - 100 mmol/L 25.9 -   O2SAT % 95.5 88.7       ADL UCSD:     Pulmonary Assessment Scores    Row Name 06/11/16 1447         ADL UCSD   ADL Phase Entry     SOB Score total 75     Rest 2     Walk 4     Stairs 5     Bath 3     Dress 3     Shop 5        Pulmonary Function Assessment:     Pulmonary Function Assessment - 06/11/16 1445      Initial Spirometry Results   FVC% 31 %   FEV1% 27 %   FEV1/FVC Ratio 68.82   Comments Test Date 06/11/16     Post Bronchodilator Spirometry Results   FVC% 51 %   FEV1% 36 %   FEV1/FVC Ratio 59.71     Breath   Bilateral Breath Sounds Decreased;Expiratory;Wheezes   Shortness of Breath Yes;Limiting activity      Exercise  Target Goals: Date: 06/11/16  Exercise Program Goal: Individual exercise prescription set with THRR, safety & activity barriers. Participant demonstrates ability to understand and report RPE using BORG scale, to self-measure pulse accurately, and to acknowledge the importance of the exercise prescription.  Exercise Prescription Goal: Starting with aerobic activity 30 plus minutes a day, 3 days per week for initial exercise prescription. Provide home exercise prescription and guidelines that participant acknowledges understanding prior to discharge.  Activity Barriers & Risk Stratification:     Activity Barriers & Cardiac Risk Stratification - 06/11/16 1445      Activity Barriers & Cardiac Risk Stratification   Activity Barriers Shortness of Breath;Deconditioning;Muscular Weakness   Cardiac Risk Stratification Moderate      6 Minute Walk:     6 Minute Walk    Row Name 06/11/16 1447         6 Minute Walk   Phase Initial     Distance 920 feet     Walk Time 5.3 minutes     # of Rest Breaks 2  14 sec, 28 sec     MPH 1.97     METS 3.75     RPE 15     Perceived Dyspnea  7     VO2 Peak 13.12     Symptoms Yes (comment)     Comments fatigue and SOB     Resting HR 118 bpm     Resting BP 126/56     Max Ex. HR 146 bpm     Max Ex. BP 128/64     2 Minute Post BP 110/60       Interval HR   Baseline HR 118     1 Minute HR 124     2 Minute HR 139     3 Minute HR 143     4 Minute HR 136     5 Minute HR 144     6 Minute HR 146     2 Minute Post HR 122     Interval Heart Rate? Yes       Interval Oxygen   Interval Oxygen? Yes     Baseline Oxygen Saturation % 94 %  95% intermitten on her concentrater     Baseline Liters of Oxygen 2 L     1 Minute Oxygen Saturation % 88 %  stopped at 1:27 82%  1 Minute Liters of Oxygen 2 L     2 Minute Oxygen Saturation % 89 %     2 Minute Liters of Oxygen 2 L     3 Minute Oxygen Saturation % 90 %  stopped for rest at 3:13 86%     3  Minute Liters of Oxygen 2 L     4 Minute Oxygen Saturation % 86 %     4 Minute Liters of Oxygen 2 L     5 Minute Oxygen Saturation % 88 %     5 Minute Liters of Oxygen 2 L     6 Minute Oxygen Saturation % 88 %     6 Minute Liters of Oxygen 2 L     2 Minute Post Oxygen Saturation % 92 %     2 Minute Post Liters of Oxygen 2 L        Initial Exercise Prescription:     Initial Exercise Prescription - 06/11/16 1400      Date of Initial Exercise RX and Referring Provider   Date 06/11/16   Referring Provider Merton Border MD     Oxygen   Oxygen Continuous   Liters 3     Treadmill   MPH 1.5   Grade 0   Minutes 15  3 min x5   METs 2.15     NuStep   Level 2   Watts --  60-80 spm   Minutes 15   METs 2     REL-XR   Level 1   Watts --  speed 40-50   Minutes 15   METs 2     Prescription Details   Frequency (times per week) 3   Duration Progress to 45 minutes of aerobic exercise without signs/symptoms of physical distress     Intensity   THRR 40-80% of Max Heartrate 136-154   Ratings of Perceived Exertion 11-15   Perceived Dyspnea 0-4     Progression   Progression Continue to progress workloads to maintain intensity without signs/symptoms of physical distress.     Resistance Training   Training Prescription Yes   Weight 2 lbs   Reps 10-15      Perform Capillary Blood Glucose checks as needed.  Exercise Prescription Changes:     Exercise Prescription Changes    Row Name 06/11/16 1400             Exercise Review   Progression --  walk test results         Response to Exercise   Blood Pressure (Admit) 126/56       Blood Pressure (Exercise) 128/64       Blood Pressure (Exit) 110/60       Heart Rate (Admit) 118 bpm       Heart Rate (Exercise) 146 bpm       Heart Rate (Exit) 122 bpm       Oxygen Saturation (Admit) 94 %       Oxygen Saturation (Exercise) 82 %       Oxygen Saturation (Exit) 92 %       Rating of Perceived Exertion (Exercise) 15        Perceived Dyspnea (Exercise) 7       Symptoms fatigue and SOB          Exercise Comments:     Exercise Comments    Row Name 06/11/16 1503           Exercise Comments Christine Serrano wants to be  able to go out and walk and to able to play with her dog.          Discharge Exercise Prescription (Final Exercise Prescription Changes):     Exercise Prescription Changes - 06/11/16 1400      Exercise Review   Progression --  walk test results     Response to Exercise   Blood Pressure (Admit) 126/56   Blood Pressure (Exercise) 128/64   Blood Pressure (Exit) 110/60   Heart Rate (Admit) 118 bpm   Heart Rate (Exercise) 146 bpm   Heart Rate (Exit) 122 bpm   Oxygen Saturation (Admit) 94 %   Oxygen Saturation (Exercise) 82 %   Oxygen Saturation (Exit) 92 %   Rating of Perceived Exertion (Exercise) 15   Perceived Dyspnea (Exercise) 7   Symptoms fatigue and SOB       Nutrition:  Target Goals: Understanding of nutrition guidelines, daily intake of sodium 1500mg , cholesterol 200mg , calories 30% from fat and 7% or less from saturated fats, daily to have 5 or more servings of fruits and vegetables.  Biometrics:     Pre Biometrics - 06/11/16 1503      Pre Biometrics   Height 5' 4.9" (1.648 m)   Weight 117 lb 9.6 oz (53.3 kg)   Waist Circumference 32.5 inches   Hip Circumference 35 inches   Waist to Hip Ratio 0.93 %   BMI (Calculated) 19.7       Nutrition Therapy Plan and Nutrition Goals:   Nutrition Discharge: Rate Your Plate Scores:   Psychosocial: Target Goals: Acknowledge presence or absence of depression, maximize coping skills, provide positive support system. Participant is able to verbalize types and ability to use techniques and skills needed for reducing stress and depression.  Initial Review & Psychosocial Screening:     Initial Psych Review & Screening - 06/11/16 [place]? Yes   Comments Christine Serrano has good wupport  from her sister-in-law who lives with Christine Serrano. Christine Serrano has had much loss in her life this past year - her boyfriend of 31 years, father-in-law , a few other relatives, and her horse of 18 years, plus her dog of 18 years. She will benefit from meeting with our Health Counselor, Lucianne Lei and participation in [place].     Barriers   Psychosocial barriers to participate in program The patient should benefit from training in stress management and relaxation.     Screening Interventions   Interventions Program counselor consult;Encouraged to exercise      Quality of Life Scores:     Quality of Life - 06/11/16 1502      Quality of Life Scores   Health/Function Pre 20.63 %   Socioeconomic Pre 19.86 %   Psych/Spiritual Pre 20.57 %   Family Pre 20.2 %   GLOBAL Pre 20.4 %      PHQ-9: Recent Review Flowsheet Data    Depression screen [facility] 2/9 06/11/2016   Decreased Interest 2   Down, Depressed, Hopeless 1   PHQ - 2 Score 3   Altered sleeping 1   Tired, decreased energy 3   Change in appetite 0   Feeling bad or failure about yourself  1   Trouble concentrating 0   Moving slowly or fidgety/restless 0   Suicidal thoughts 0   PHQ-9 Score 8   Difficult doing work/chores Very difficult      Psychosocial Evaluation and Intervention:   Psychosocial  Re-Evaluation:  Education: Education Goals: Education classes will be provided on a weekly basis, covering required topics. Participant will state understanding/return demonstration of topics presented.  Learning Barriers/Preferences:     Learning Barriers/Preferences - 06/11/16 1445      Learning Barriers/Preferences   Learning Barriers None   Learning Preferences None      Education Topics: Initial Evaluation Education: - Verbal, written and demonstration of respiratory meds, RPE/PD scales, oximetry and breathing techniques. Instruction on use of nebulizers and MDIs: cleaning and proper use, rinsing mouth with  steroid doses and importance of monitoring MDI activations. Flowsheet Row Pulmonary Rehab from 06/11/2016 in [facility] Cardiac and Pulmonary Rehab  Date  06/11/16  Educator  LB  Instruction Review Code  2- meets goals/outcomes      General Nutrition Guidelines/Fats and Fiber: -Group instruction provided by verbal, written material, models and posters to present the general guidelines for heart healthy nutrition. Gives an explanation and review of dietary fats and fiber.   Controlling Sodium/Reading Food Labels: -Group verbal and written material supporting the discussion of sodium use in heart healthy nutrition. Review and explanation with models, verbal and written materials for utilization of the food label.   Exercise Physiology & Risk Factors: - Group verbal and written instruction with models to review the exercise physiology of the cardiovascular system and associated critical values. Details cardiovascular disease risk factors and the goals associated with each risk factor.   Aerobic Exercise & Resistance Training: - Gives group verbal and written discussion on the health impact of inactivity. On the components of aerobic and resistive training programs and the benefits of this training and how to safely progress through these programs.   Flexibility, Balance, General Exercise Guidelines: - Provides group verbal and written instruction on the benefits of flexibility and balance training programs. Provides general exercise guidelines with specific guidelines to those with heart or lung disease. Demonstration and skill practice provided.   Stress Management: - Provides group verbal and written instruction about the health risks of elevated stress, cause of high stress, and healthy ways to reduce stress.   Depression: - Provides group verbal and written instruction on the correlation between heart/lung disease and depressed mood, treatment options, and the stigmas associated with  seeking treatment.   Exercise & Equipment Safety: - Individual verbal instruction and demonstration of equipment use and safety with use of the equipment.   Infection Prevention: - Provides verbal and written material to individual with discussion of infection control including proper hand washing and proper equipment cleaning during exercise session. Flowsheet Row Pulmonary Rehab from 06/11/2016 in [facility] Cardiac and Pulmonary Rehab  Date  06/11/16  Educator  LB  Instruction Review Code  2- meets goals/outcomes      Falls Prevention: - Provides verbal and written material to individual with discussion of falls prevention and safety. Flowsheet Row Pulmonary Rehab from 06/11/2016 in [facility] Cardiac and Pulmonary Rehab  Date  06/11/16  Educator  LB  Instruction Review Code  2- meets goals/outcomes      Diabetes: - Individual verbal and written instruction to review signs/symptoms of diabetes, desired ranges of glucose level fasting, after meals and with exercise. Advice that pre and post exercise glucose checks will be done for 3 sessions at entry of program.   Chronic Lung Diseases: - Group verbal and written instruction to review new updates, new respiratory medications, new advancements in procedures and treatments. Provide informative websites and "800" numbers of self-education.   Lung Procedures: - Group verbal  and written instruction to describe testing methods done to diagnose lung disease. Review the outcome of test results. Describe the treatment choices: Pulmonary Function Tests, ABGs and oximetry.   Energy Conservation: - Provide group verbal and written instruction for methods to conserve energy, plan and organize activities. Instruct on pacing techniques, use of adaptive equipment and posture/positioning to relieve shortness of breath.   Triggers: - Group verbal and written instruction to review types of environmental controls: home humidity, furnaces, filters, dust  mite/pet prevention, HEPA vacuums. To discuss weather changes, air quality and the benefits of nasal washing.   Exacerbations: - Group verbal and written instruction to provide: warning signs, infection symptoms, calling MD promptly, preventive modes, and value of vaccinations. Review: effective airway clearance, coughing and/or vibration techniques. Create an Sports administrator.   Oxygen: - Individual and group verbal and written instruction on oxygen therapy. Includes supplement oxygen, available portable oxygen systems, continuous and intermittent flow rates, oxygen safety, concentrators, and Medicare reimbursement for oxygen. Flowsheet Row Pulmonary Rehab from 06/11/2016 in [facility] Cardiac and Pulmonary Rehab  Date  06/11/16  Educator  LB  Instruction Review Code  2- meets goals/outcomes      Respiratory Medications: - Group verbal and written instruction to review medications for lung disease. Drug class, frequency, complications, importance of spacers, rinsing mouth after steroid MDI's, and proper cleaning methods for nebulizers. Flowsheet Row Pulmonary Rehab from 06/11/2016 in [facility] Cardiac and Pulmonary Rehab  Date  06/11/16  Educator  LB  Instruction Review Code  2- meets goals/outcomes      AED/CPR: - Group verbal and written instruction with the use of models to demonstrate the basic use of the AED with the basic ABC's of resuscitation.   Breathing Retraining: - Provides individuals verbal and written instruction on purpose, frequency, and proper technique of diaphragmatic breathing and pursed-lipped breathing. Applies individual practice skills. Flowsheet Row Pulmonary Rehab from 06/11/2016 in [facility] Cardiac and Pulmonary Rehab  Date  06/11/16  Educator  LB  Instruction Review Code  2- meets goals/outcomes      Anatomy and Physiology of the Lungs: - Group verbal and written instruction with the use of models to provide basic lung anatomy and physiology related to function,  structure and complications of lung disease.   Heart Failure: - Group verbal and written instruction on the basics of heart failure: signs/symptoms, treatments, explanation of ejection fraction, enlarged heart and cardiomyopathy.   Sleep Apnea: - Individual verbal and written instruction to review Obstructive Sleep Apnea. Review of risk factors, methods for diagnosing and types of masks and machines for OSA.   Anxiety: - Provides group, verbal and written instruction on the correlation between heart/lung disease and anxiety, treatment options, and management of anxiety.   Relaxation: - Provides group, verbal and written instruction about the benefits of relaxation for patients with heart/lung disease. Also provides patients with examples of relaxation techniques.   Knowledge Questionnaire Score:     Knowledge Questionnaire Score - 06/11/16 1445      Knowledge Questionnaire Score   Pre Score 7/10       Core Components/Risk Factors/Patient Goals at Admission:     Personal Goals and Risk Factors at Admission - 06/11/16 1452      Core Components/Risk Factors/Patient Goals on Admission    Weight Management Yes;Weight Maintenance;Weight Gain   Intervention Weight Management: Develop a combined nutrition and exercise program designed to reach desired caloric intake, while maintaining appropriate intake of nutrient and fiber, sodium and fats, and appropriate  energy expenditure required for the weight goal.;Weight Management: Provide education and appropriate resources to help participant work on and attain dietary goals.   Admit Weight 117 lb 9.6 oz (53.3 kg)   Goal Weight: Short Term 120 lb (54.4 kg)   Goal Weight: Long Term 122 lb (55.3 kg)   Expected Outcomes Weight Gain: Understanding of general recommendations for a high calorie, high protein meal plan that promotes weight gain by distributing calorie intake throughout the day with the consumption for 4-5 meals, snacks, and/or  supplements;Short Term: Continue to assess and modify interventions until short term weight is achieved;Long Term: Adherence to nutrition and physical activity/exercise program aimed toward attainment of established weight goal   Sedentary Yes   Intervention Provide advice, education, support and counseling about physical activity/exercise needs.;Develop an individualized exercise prescription for aerobic and resistive training based on initial evaluation findings, risk stratification, comorbidities and participant's personal goals.   Expected Outcomes Achievement of increased cardiorespiratory fitness and enhanced flexibility, muscular endurance and strength shown through measurements of functional capacity and personal statement of participant.   Increase Strength and Stamina Yes   Intervention Provide advice, education, support and counseling about physical activity/exercise needs.;Develop an individualized exercise prescription for aerobic and resistive training based on initial evaluation findings, risk stratification, comorbidities and participant's personal goals.   Expected Outcomes Achievement of increased cardiorespiratory fitness and enhanced flexibility, muscular endurance and strength shown through measurements of functional capacity and personal statement of participant.   Improve shortness of breath with ADL's Yes   Intervention Provide education, individualized exercise plan and daily activity instruction to help decrease symptoms of SOB with activities of daily living.   Expected Outcomes Short Term: Achieves a reduction of symptoms when performing activities of daily living.   Develop more efficient breathing techniques such as purse lipped breathing and diaphragmatic breathing; and practicing self-pacing with activity Yes   Intervention Provide education, demonstration and support about specific breathing techniuqes utilized for more efficient breathing. Include techniques such as  pursed lipped breathing, diaphragmatic breathing and self-pacing activity.   Expected Outcomes Short Term: Participant will be able to demonstrate and use breathing techniques as needed throughout daily activities.   Increase knowledge of respiratory medications and ability to use respiratory devices properly  Yes   Intervention Provide education and demonstration as needed of appropriate use of medications, inhalers, and oxygen therapy.  Ventolin, Symbicort, Spiriva MDI's; Duoneb; Oxygen 2l/m   Expected Outcomes Short Term: Achieves understanding of medications use. Understands that oxygen is a medication prescribed by physician. Demonstrates appropriate use of inhaler and oxygen therapy.      Core Components/Risk Factors/Patient Goals Review:    Core Components/Risk Factors/Patient Goals at Discharge (Final Review):    ITP Comments:   Comments: Christine Serrano plans to start [facility] on 06/24/16 and attend 3 days/week.

## 2016-06-21 DIAGNOSIS — J449 Chronic obstructive pulmonary disease, unspecified: Secondary | ICD-10-CM | POA: Diagnosis not present

## 2016-06-24 ENCOUNTER — Encounter: Payer: Commercial Managed Care - HMO | Attending: Pulmonary Disease

## 2016-06-24 DIAGNOSIS — J449 Chronic obstructive pulmonary disease, unspecified: Secondary | ICD-10-CM | POA: Insufficient documentation

## 2016-06-26 ENCOUNTER — Telehealth: Payer: Self-pay | Admitting: Respiratory Therapy

## 2016-06-26 ENCOUNTER — Encounter: Payer: Self-pay | Admitting: Respiratory Therapy

## 2016-06-26 DIAGNOSIS — J449 Chronic obstructive pulmonary disease, unspecified: Secondary | ICD-10-CM

## 2016-06-26 NOTE — Telephone Encounter (Signed)
Called Christine Serrano who had an appointment to start [name] on 06/24/16. She has been sick , has an appointment on 07/02/16 with her physician, and plans to start exercise in [place] as soon as she recovers from her illness.

## 2016-07-01 ENCOUNTER — Encounter: Payer: Self-pay | Admitting: Respiratory Therapy

## 2016-07-01 DIAGNOSIS — J449 Chronic obstructive pulmonary disease, unspecified: Secondary | ICD-10-CM

## 2016-07-01 NOTE — Progress Notes (Signed)
Pulmonary Individual Treatment Plan  Patient Details  Name: Christine Serrano MRN: [id number] Date of Birth: [birth year] Referring Provider:   Flowsheet Row Pulmonary Rehab from 06/11/2016 in [facility] Cardiac and Pulmonary Rehab  Referring Provider  Merton Border MD      Initial Encounter Date:  Flowsheet Row Pulmonary Rehab from 06/11/2016 in [facility] Cardiac and Pulmonary Rehab  Date  06/11/16  Referring Provider  Merton Border MD      Visit Diagnosis: COPD, severe ([place])  Patient's Home Medications on Admission:  Current Outpatient Prescriptions:    aspirin EC 81 MG tablet, Take 81 mg by mouth daily., Disp: , Rfl:    Cholecalciferol (VITAMIN D) 2000 units tablet, Take 2,000 Units by mouth daily., Disp: , Rfl:    cyproheptadine (PERIACTIN) 4 MG tablet, Take 4 mg by mouth 2 (two) times daily., Disp: , Rfl:    diazepam (VALIUM) 5 MG tablet, Take 5 mg by mouth daily as needed for anxiety., Disp: , Rfl:    gabapentin (NEURONTIN) 300 MG capsule, Take 300 mg by mouth 3 (three) times daily., Disp: , Rfl:    ipratropium-albuterol (DUONEB) 0.5-2.5 (3) MG/3ML SOLN, , Disp: , Rfl:    LINZESS 290 MCG CAPS capsule, Take 290 mcg by mouth daily., Disp: , Rfl:    Multiple Vitamins-Minerals (MULTIVITAMIN ADULT) TABS, Take 1 tablet by mouth daily., Disp: , Rfl:    SYMBICORT 160-4.5 MCG/ACT inhaler, Inhale 2 puffs into the lungs 2 (two) times daily., Disp: , Rfl:    tiotropium (SPIRIVA) 18 MCG inhalation capsule, Place 18 mcg into inhaler and inhale daily., Disp: , Rfl:    traZODone (DESYREL) 50 MG tablet, Take 100 mg by mouth at bedtime., Disp: , Rfl:    varenicline (CHANTIX CONTINUING MONTH PAK) 1 MG tablet, Take 1 tablet (1 mg total) by mouth 2 (two) times daily., Disp: 60 tablet, Rfl: 5   varenicline (CHANTIX PAK) 0.5 MG X 11 & 1 MG X 42 tablet, Take one 0.5 mg tablet by mouth once daily for 3 days, then increase to one 0.5 mg tablet twice daily for 4 days, then increase to one 1 mg tablet  twice daily., Disp: 53 tablet, Rfl: 0   venlafaxine XR (EFFEXOR-XR) 150 MG 24 hr capsule, Take 150 mg by mouth daily with breakfast., Disp: , Rfl:    VENTOLIN HFA 108 (90 Base) MCG/ACT inhaler, Inhale 2 puffs into the lungs every 6 (six) hours as needed for shortness of breath., Disp: , Rfl:    vitamin B-12 (CYANOCOBALAMIN) 1000 MCG tablet, Take 1,000 mcg by mouth daily., Disp: , Rfl:    vitamin E 400 UNIT capsule, Take 400 Units by mouth daily. , Disp: , Rfl:   Past Medical History: Past Medical History:  Diagnosis Date   Chronic idiopathic constipation    COPD (chronic obstructive pulmonary disease) (HCC)     Tobacco Use: History  Smoking Status   Current Every Day Smoker   Packs/day: 2.00   Types: Cigarettes  Smokeless Tobacco   Never Used    Labs: Recent Review Flowsheet Data    Labs for ITP Cardiac and Pulmonary Rehab Latest Ref Rng & Units 04/04/2009 04/09/2016   PHART 7.350 - 7.450 7.302(L) 7.30(L)   PCO2ART 32.0 - 48.0 mmHg 59.5 CRITICAL RESULT CALLED TO, READ BACK BY AND VERIFIED WITH: [facility] 04/04/09 AT 1440 CRITICAL RESULT CALLED TO, READ BACK BY AND VERIFIED WITH: [facility] 04/04/09 AT 1440 BY BROADNAX,L.RRT(HH) 65(H)   HCO3 20.0 - 28.0 mmol/L 28.5(H) 32.0(H)  TCO2 0 - 100 mmol/L 25.9 -   O2SAT % 95.5 88.7       ADL UCSD:     Pulmonary Assessment Scores    Row Name 06/11/16 1447         ADL UCSD   ADL Phase Entry     SOB Score total 75     Rest 2     Walk 4     Stairs 5     Bath 3     Dress 3     Shop 5        Pulmonary Function Assessment:     Pulmonary Function Assessment - 06/11/16 1445      Initial Spirometry Results   FVC% 31 %   FEV1% 27 %   FEV1/FVC Ratio 68.82   Comments Test Date 06/11/16     Post Bronchodilator Spirometry Results   FVC% 51 %   FEV1% 36 %   FEV1/FVC Ratio 59.71     Breath   Bilateral Breath Sounds Decreased;Expiratory;Wheezes   Shortness of Breath Yes;Limiting activity      Exercise  Target Goals:    Exercise Program Goal: Individual exercise prescription set with THRR, safety & activity barriers. Participant demonstrates ability to understand and report RPE using BORG scale, to self-measure pulse accurately, and to acknowledge the importance of the exercise prescription.  Exercise Prescription Goal: Starting with aerobic activity 30 plus minutes a day, 3 days per week for initial exercise prescription. Provide home exercise prescription and guidelines that participant acknowledges understanding prior to discharge.  Activity Barriers & Risk Stratification:     Activity Barriers & Cardiac Risk Stratification - 06/11/16 1445      Activity Barriers & Cardiac Risk Stratification   Activity Barriers Shortness of Breath;Deconditioning;Muscular Weakness   Cardiac Risk Stratification Moderate      6 Minute Walk:     6 Minute Walk    Row Name 06/11/16 1447         6 Minute Walk   Phase Initial     Distance 920 feet     Walk Time 5.3 minutes     # of Rest Breaks 2  14 sec, 28 sec     MPH 1.97     METS 3.75     RPE 15     Perceived Dyspnea  7     VO2 Peak 13.12     Symptoms Yes (comment)     Comments fatigue and SOB     Resting HR 118 bpm     Resting BP 126/56     Max Ex. HR 146 bpm     Max Ex. BP 128/64     2 Minute Post BP 110/60       Interval HR   Baseline HR 118     1 Minute HR 124     2 Minute HR 139     3 Minute HR 143     4 Minute HR 136     5 Minute HR 144     6 Minute HR 146     2 Minute Post HR 122     Interval Heart Rate? Yes       Interval Oxygen   Interval Oxygen? Yes     Baseline Oxygen Saturation % 94 %  95% intermitten on her concentrater     Baseline Liters of Oxygen 2 L     1 Minute Oxygen Saturation % 88 %  stopped at 1:27 82%  1 Minute Liters of Oxygen 2 L     2 Minute Oxygen Saturation % 89 %     2 Minute Liters of Oxygen 2 L     3 Minute Oxygen Saturation % 90 %  stopped for rest at 3:13 86%     3 Minute Liters  of Oxygen 2 L     4 Minute Oxygen Saturation % 86 %     4 Minute Liters of Oxygen 2 L     5 Minute Oxygen Saturation % 88 %     5 Minute Liters of Oxygen 2 L     6 Minute Oxygen Saturation % 88 %     6 Minute Liters of Oxygen 2 L     2 Minute Post Oxygen Saturation % 92 %     2 Minute Post Liters of Oxygen 2 L        Initial Exercise Prescription:     Initial Exercise Prescription - 06/11/16 1400      Date of Initial Exercise RX and Referring Provider   Date 06/11/16   Referring Provider Merton Border MD     Oxygen   Oxygen Continuous   Liters 3     Treadmill   MPH 1.5   Grade 0   Minutes 15  3 min x5   METs 2.15     NuStep   Level 2   Watts --  60-80 spm   Minutes 15   METs 2     REL-XR   Level 1   Watts --  speed 40-50   Minutes 15   METs 2     Prescription Details   Frequency (times per week) 3   Duration Progress to 45 minutes of aerobic exercise without signs/symptoms of physical distress     Intensity   THRR 40-80% of Max Heartrate 136-154   Ratings of Perceived Exertion 11-15   Perceived Dyspnea 0-4     Progression   Progression Continue to progress workloads to maintain intensity without signs/symptoms of physical distress.     Resistance Training   Training Prescription Yes   Weight 2 lbs   Reps 10-15      Perform Capillary Blood Glucose checks as needed.  Exercise Prescription Changes:     Exercise Prescription Changes    Row Name 06/11/16 1400             Exercise Review   Progression --  walk test results         Response to Exercise   Blood Pressure (Admit) 126/56       Blood Pressure (Exercise) 128/64       Blood Pressure (Exit) 110/60       Heart Rate (Admit) 118 bpm       Heart Rate (Exercise) 146 bpm       Heart Rate (Exit) 122 bpm       Oxygen Saturation (Admit) 94 %       Oxygen Saturation (Exercise) 82 %       Oxygen Saturation (Exit) 92 %       Rating of Perceived Exertion (Exercise) 15       Perceived  Dyspnea (Exercise) 7       Symptoms fatigue and SOB          Exercise Comments:     Exercise Comments    Row Name 06/11/16 1503           Exercise Comments Flor wants to be  able to go out and walk and to able to play with her dog.          Discharge Exercise Prescription (Final Exercise Prescription Changes):     Exercise Prescription Changes - 06/11/16 1400      Exercise Review   Progression --  walk test results     Response to Exercise   Blood Pressure (Admit) 126/56   Blood Pressure (Exercise) 128/64   Blood Pressure (Exit) 110/60   Heart Rate (Admit) 118 bpm   Heart Rate (Exercise) 146 bpm   Heart Rate (Exit) 122 bpm   Oxygen Saturation (Admit) 94 %   Oxygen Saturation (Exercise) 82 %   Oxygen Saturation (Exit) 92 %   Rating of Perceived Exertion (Exercise) 15   Perceived Dyspnea (Exercise) 7   Symptoms fatigue and SOB       Nutrition:  Target Goals: Understanding of nutrition guidelines, daily intake of sodium 1500mg , cholesterol 200mg , calories 30% from fat and 7% or less from saturated fats, daily to have 5 or more servings of fruits and vegetables.  Biometrics:     Pre Biometrics - 06/11/16 1503      Pre Biometrics   Height 5' 4.9" (1.648 m)   Weight 117 lb 9.6 oz (53.3 kg)   Waist Circumference 32.5 inches   Hip Circumference 35 inches   Waist to Hip Ratio 0.93 %   BMI (Calculated) 19.7       Nutrition Therapy Plan and Nutrition Goals:   Nutrition Discharge: Rate Your Plate Scores:   Psychosocial: Target Goals: Acknowledge presence or absence of depression, maximize coping skills, provide positive support system. Participant is able to verbalize types and ability to use techniques and skills needed for reducing stress and depression.  Initial Review & Psychosocial Screening:     Initial Psych Review & Screening - 06/11/16 [place]? Yes   Comments Ms Karpinski has good wupport from her  sister-in-law who lives with Ms Giron. Ms Serra has had much loss in her life this past year - her boyfriend of 2 years, father-in-law , a few other relatives, and her horse of 18 years, plus her dog of 18 years. She will benefit from meeting with our Health Counselor, Lucianne Lei and participation in [place].     Barriers   Psychosocial barriers to participate in program The patient should benefit from training in stress management and relaxation.     Screening Interventions   Interventions Program counselor consult;Encouraged to exercise      Quality of Life Scores:     Quality of Life - 06/11/16 1502      Quality of Life Scores   Health/Function Pre 20.63 %   Socioeconomic Pre 19.86 %   Psych/Spiritual Pre 20.57 %   Family Pre 20.2 %   GLOBAL Pre 20.4 %      PHQ-9: Recent Review Flowsheet Data    Depression screen [facility] 2/9 06/11/2016   Decreased Interest 2   Down, Depressed, Hopeless 1   PHQ - 2 Score 3   Altered sleeping 1   Tired, decreased energy 3   Change in appetite 0   Feeling bad or failure about yourself  1   Trouble concentrating 0   Moving slowly or fidgety/restless 0   Suicidal thoughts 0   PHQ-9 Score 8   Difficult doing work/chores Very difficult      Psychosocial Evaluation and Intervention:   Psychosocial  Re-Evaluation:  Education: Education Goals: Education classes will be provided on a weekly basis, covering required topics. Participant will state understanding/return demonstration of topics presented.  Learning Barriers/Preferences:     Learning Barriers/Preferences - 06/11/16 1445      Learning Barriers/Preferences   Learning Barriers None   Learning Preferences None      Education Topics: Initial Evaluation Education: - Verbal, written and demonstration of respiratory meds, RPE/PD scales, oximetry and breathing techniques. Instruction on use of nebulizers and MDIs: cleaning and proper use, rinsing mouth with steroid  doses and importance of monitoring MDI activations. Flowsheet Row Pulmonary Rehab from 06/11/2016 in [facility] Cardiac and Pulmonary Rehab  Date  06/11/16  Educator  LB  Instruction Review Code  2- meets goals/outcomes      General Nutrition Guidelines/Fats and Fiber: -Group instruction provided by verbal, written material, models and posters to present the general guidelines for heart healthy nutrition. Gives an explanation and review of dietary fats and fiber.   Controlling Sodium/Reading Food Labels: -Group verbal and written material supporting the discussion of sodium use in heart healthy nutrition. Review and explanation with models, verbal and written materials for utilization of the food label.   Exercise Physiology & Risk Factors: - Group verbal and written instruction with models to review the exercise physiology of the cardiovascular system and associated critical values. Details cardiovascular disease risk factors and the goals associated with each risk factor.   Aerobic Exercise & Resistance Training: - Gives group verbal and written discussion on the health impact of inactivity. On the components of aerobic and resistive training programs and the benefits of this training and how to safely progress through these programs.   Flexibility, Balance, General Exercise Guidelines: - Provides group verbal and written instruction on the benefits of flexibility and balance training programs. Provides general exercise guidelines with specific guidelines to those with heart or lung disease. Demonstration and skill practice provided.   Stress Management: - Provides group verbal and written instruction about the health risks of elevated stress, cause of high stress, and healthy ways to reduce stress.   Depression: - Provides group verbal and written instruction on the correlation between heart/lung disease and depressed mood, treatment options, and the stigmas associated with seeking  treatment.   Exercise & Equipment Safety: - Individual verbal instruction and demonstration of equipment use and safety with use of the equipment.   Infection Prevention: - Provides verbal and written material to individual with discussion of infection control including proper hand washing and proper equipment cleaning during exercise session. Flowsheet Row Pulmonary Rehab from 06/11/2016 in [facility] Cardiac and Pulmonary Rehab  Date  06/11/16  Educator  LB  Instruction Review Code  2- meets goals/outcomes      Falls Prevention: - Provides verbal and written material to individual with discussion of falls prevention and safety. Flowsheet Row Pulmonary Rehab from 06/11/2016 in [facility] Cardiac and Pulmonary Rehab  Date  06/11/16  Educator  LB  Instruction Review Code  2- meets goals/outcomes      Diabetes: - Individual verbal and written instruction to review signs/symptoms of diabetes, desired ranges of glucose level fasting, after meals and with exercise. Advice that pre and post exercise glucose checks will be done for 3 sessions at entry of program.   Chronic Lung Diseases: - Group verbal and written instruction to review new updates, new respiratory medications, new advancements in procedures and treatments. Provide informative websites and "800" numbers of self-education.   Lung Procedures: - Group verbal  and written instruction to describe testing methods done to diagnose lung disease. Review the outcome of test results. Describe the treatment choices: Pulmonary Function Tests, ABGs and oximetry.   Energy Conservation: - Provide group verbal and written instruction for methods to conserve energy, plan and organize activities. Instruct on pacing techniques, use of adaptive equipment and posture/positioning to relieve shortness of breath.   Triggers: - Group verbal and written instruction to review types of environmental controls: home humidity, furnaces, filters, dust mite/pet  prevention, HEPA vacuums. To discuss weather changes, air quality and the benefits of nasal washing.   Exacerbations: - Group verbal and written instruction to provide: warning signs, infection symptoms, calling MD promptly, preventive modes, and value of vaccinations. Review: effective airway clearance, coughing and/or vibration techniques. Create an Sports administrator.   Oxygen: - Individual and group verbal and written instruction on oxygen therapy. Includes supplement oxygen, available portable oxygen systems, continuous and intermittent flow rates, oxygen safety, concentrators, and Medicare reimbursement for oxygen. Flowsheet Row Pulmonary Rehab from 06/11/2016 in [facility] Cardiac and Pulmonary Rehab  Date  06/11/16  Educator  LB  Instruction Review Code  2- meets goals/outcomes      Respiratory Medications: - Group verbal and written instruction to review medications for lung disease. Drug class, frequency, complications, importance of spacers, rinsing mouth after steroid MDI's, and proper cleaning methods for nebulizers. Flowsheet Row Pulmonary Rehab from 06/11/2016 in [facility] Cardiac and Pulmonary Rehab  Date  06/11/16  Educator  LB  Instruction Review Code  2- meets goals/outcomes      AED/CPR: - Group verbal and written instruction with the use of models to demonstrate the basic use of the AED with the basic ABC's of resuscitation.   Breathing Retraining: - Provides individuals verbal and written instruction on purpose, frequency, and proper technique of diaphragmatic breathing and pursed-lipped breathing. Applies individual practice skills. Flowsheet Row Pulmonary Rehab from 06/11/2016 in [facility] Cardiac and Pulmonary Rehab  Date  06/11/16  Educator  LB  Instruction Review Code  2- meets goals/outcomes      Anatomy and Physiology of the Lungs: - Group verbal and written instruction with the use of models to provide basic lung anatomy and physiology related to function, structure and  complications of lung disease.   Heart Failure: - Group verbal and written instruction on the basics of heart failure: signs/symptoms, treatments, explanation of ejection fraction, enlarged heart and cardiomyopathy.   Sleep Apnea: - Individual verbal and written instruction to review Obstructive Sleep Apnea. Review of risk factors, methods for diagnosing and types of masks and machines for OSA.   Anxiety: - Provides group, verbal and written instruction on the correlation between heart/lung disease and anxiety, treatment options, and management of anxiety.   Relaxation: - Provides group, verbal and written instruction about the benefits of relaxation for patients with heart/lung disease. Also provides patients with examples of relaxation techniques.   Knowledge Questionnaire Score:     Knowledge Questionnaire Score - 06/11/16 1445      Knowledge Questionnaire Score   Pre Score 7/10       Core Components/Risk Factors/Patient Goals at Admission:     Personal Goals and Risk Factors at Admission - 06/11/16 1452      Core Components/Risk Factors/Patient Goals on Admission    Weight Management Yes;Weight Maintenance;Weight Gain   Intervention Weight Management: Develop a combined nutrition and exercise program designed to reach desired caloric intake, while maintaining appropriate intake of nutrient and fiber, sodium and fats, and appropriate  energy expenditure required for the weight goal.;Weight Management: Provide education and appropriate resources to help participant work on and attain dietary goals.   Admit Weight 117 lb 9.6 oz (53.3 kg)   Goal Weight: Short Term 120 lb (54.4 kg)   Goal Weight: Long Term 122 lb (55.3 kg)   Expected Outcomes Weight Gain: Understanding of general recommendations for a high calorie, high protein meal plan that promotes weight gain by distributing calorie intake throughout the day with the consumption for 4-5 meals, snacks, and/or  supplements;Short Term: Continue to assess and modify interventions until short term weight is achieved;Long Term: Adherence to nutrition and physical activity/exercise program aimed toward attainment of established weight goal   Sedentary Yes   Intervention Provide advice, education, support and counseling about physical activity/exercise needs.;Develop an individualized exercise prescription for aerobic and resistive training based on initial evaluation findings, risk stratification, comorbidities and participant's personal goals.   Expected Outcomes Achievement of increased cardiorespiratory fitness and enhanced flexibility, muscular endurance and strength shown through measurements of functional capacity and personal statement of participant.   Increase Strength and Stamina Yes   Intervention Provide advice, education, support and counseling about physical activity/exercise needs.;Develop an individualized exercise prescription for aerobic and resistive training based on initial evaluation findings, risk stratification, comorbidities and participant's personal goals.   Expected Outcomes Achievement of increased cardiorespiratory fitness and enhanced flexibility, muscular endurance and strength shown through measurements of functional capacity and personal statement of participant.   Improve shortness of breath with ADL's Yes   Intervention Provide education, individualized exercise plan and daily activity instruction to help decrease symptoms of SOB with activities of daily living.   Expected Outcomes Short Term: Achieves a reduction of symptoms when performing activities of daily living.   Develop more efficient breathing techniques such as purse lipped breathing and diaphragmatic breathing; and practicing self-pacing with activity Yes   Intervention Provide education, demonstration and support about specific breathing techniuqes utilized for more efficient breathing. Include techniques such as  pursed lipped breathing, diaphragmatic breathing and self-pacing activity.   Expected Outcomes Short Term: Participant will be able to demonstrate and use breathing techniques as needed throughout daily activities.   Increase knowledge of respiratory medications and ability to use respiratory devices properly  Yes   Intervention Provide education and demonstration as needed of appropriate use of medications, inhalers, and oxygen therapy.  Ventolin, Symbicort, Spiriva MDI's; Duoneb; Oxygen 2l/m   Expected Outcomes Short Term: Achieves understanding of medications use. Understands that oxygen is a medication prescribed by physician. Demonstrates appropriate use of inhaler and oxygen therapy.      Core Components/Risk Factors/Patient Goals Review:    Core Components/Risk Factors/Patient Goals at Discharge (Final Review):    ITP Comments:     ITP Comments    Row Name 06/26/16 1535           ITP Comments Called Ms Clendenon who had an appointment to start [place] on 06/24/16. She has been sick , has an appointment on 07/02/16 with her physician, and plans to start exercise in [place] as soon as she recovers from her illness.          Comments: 30 day note review

## 2016-07-02 ENCOUNTER — Encounter: Payer: Self-pay | Admitting: Internal Medicine

## 2016-07-02 ENCOUNTER — Ambulatory Visit (INDEPENDENT_AMBULATORY_CARE_PROVIDER_SITE_OTHER): Payer: Commercial Managed Care - HMO | Admitting: Internal Medicine

## 2016-07-02 DIAGNOSIS — R251 Tremor, unspecified: Secondary | ICD-10-CM | POA: Diagnosis not present

## 2016-07-02 DIAGNOSIS — F5101 Primary insomnia: Secondary | ICD-10-CM

## 2016-07-02 DIAGNOSIS — F1010 Alcohol abuse, uncomplicated: Secondary | ICD-10-CM | POA: Diagnosis not present

## 2016-07-02 DIAGNOSIS — J439 Emphysema, unspecified: Secondary | ICD-10-CM | POA: Diagnosis not present

## 2016-07-02 DIAGNOSIS — K5909 Other constipation: Secondary | ICD-10-CM | POA: Diagnosis not present

## 2016-07-02 DIAGNOSIS — F329 Major depressive disorder, single episode, unspecified: Secondary | ICD-10-CM | POA: Diagnosis not present

## 2016-07-02 DIAGNOSIS — R030 Elevated blood-pressure reading, without diagnosis of hypertension: Secondary | ICD-10-CM | POA: Diagnosis not present

## 2016-07-02 MED ORDER — TIOTROPIUM BROMIDE MONOHYDRATE 18 MCG IN CAPS: 18.0000 ug | ORAL_CAPSULE | Freq: Every day | RESPIRATORY_TRACT | 11 refills | Status: DC

## 2016-07-02 MED ORDER — VARENICLINE TARTRATE 1 MG PO TABS: 1.0000 mg | ORAL_TABLET | Freq: Two times a day (BID) | ORAL | 2 refills | Status: DC

## 2016-07-03 DIAGNOSIS — F331 Major depressive disorder, recurrent, moderate: Secondary | ICD-10-CM | POA: Insufficient documentation

## 2016-07-03 DIAGNOSIS — F332 Major depressive disorder, recurrent severe without psychotic features: Secondary | ICD-10-CM | POA: Insufficient documentation

## 2016-07-03 DIAGNOSIS — F5104 Psychophysiologic insomnia: Secondary | ICD-10-CM | POA: Insufficient documentation

## 2016-07-03 DIAGNOSIS — Z789 Other specified health status: Secondary | ICD-10-CM | POA: Insufficient documentation

## 2016-07-03 DIAGNOSIS — K5909 Other constipation: Secondary | ICD-10-CM | POA: Insufficient documentation

## 2016-07-03 DIAGNOSIS — J449 Chronic obstructive pulmonary disease, unspecified: Secondary | ICD-10-CM | POA: Insufficient documentation

## 2016-07-03 DIAGNOSIS — F1011 Alcohol abuse, in remission: Secondary | ICD-10-CM | POA: Insufficient documentation

## 2016-07-03 DIAGNOSIS — R251 Tremor, unspecified: Secondary | ICD-10-CM | POA: Insufficient documentation

## 2016-07-03 DIAGNOSIS — R030 Elevated blood-pressure reading, without diagnosis of hypertension: Secondary | ICD-10-CM | POA: Insufficient documentation

## 2016-07-03 NOTE — Assessment & Plan Note (Addendum)
Should no longer be r/t alcohol Will wean back on the Neurontin with hopes of stopping Start taking only 1 x day for now

## 2016-07-03 NOTE — Assessment & Plan Note (Signed)
Continue Trazadone for now Will monitor 

## 2016-07-03 NOTE — Assessment & Plan Note (Signed)
Discussed the importance of smoking cessation She is on the Chantix starting month pack Will send in continuing dose pack Continue Symbicort and Spiriva, Spiriva refilled today She has Albuterol and Duoneb if needed She will follow up with Dr. Alva Garnet

## 2016-07-03 NOTE — Assessment & Plan Note (Signed)
Sober for 9 months Discussed the importance of going t AA meetings so that she will stay sober

## 2016-07-03 NOTE — Patient Instructions (Signed)
Hypertension Hypertension is another name for high blood pressure. High blood pressure forces your heart to work harder to pump blood. A blood pressure reading has two numbers, which includes a higher number over a lower number (example: 110/72). Follow these instructions at home:  Have your blood pressure rechecked by your doctor.  Only take medicine as told by your doctor. Follow the directions carefully. The medicine does not work as well if you skip doses. Skipping doses also puts you at risk for problems.  Do not smoke.  Monitor your blood pressure at home as told by your doctor. Contact a doctor if:  You think you are having a reaction to the medicine you are taking.  You have repeat headaches or feel dizzy.  You have puffiness (swelling) in your ankles.  You have trouble with your vision. Get help right away if:  You get a very bad headache and are confused.  You feel weak, numb, or faint.  You get chest or belly (abdominal) pain.  You throw up (vomit).  You cannot breathe very well. This information is not intended to replace advice given to you by your health care provider. Make sure you discuss any questions you have with your health care provider. Document Released: 11/20/2007 Document Revised: 11/09/2015 Document Reviewed: 03/26/2013 Elsevier Interactive Patient Education  2017 [employer]  

## 2016-07-03 NOTE — Assessment & Plan Note (Signed)
Controlled with Linzess and Amitiza Discussed the importance of fiber in the diet and drinking lots of water Will monitor

## 2016-07-03 NOTE — Assessment & Plan Note (Signed)
Encouraged her to consume a low salt diet and increase aerobic exercise Discussed stress management techniques  RTC in 3 weeks to follow up BP

## 2016-07-03 NOTE — Assessment & Plan Note (Signed)
Ongoing but improving Will continue Effexor and Valium Not sure she needs to Periactin but will continue for now Will monitor

## 2016-07-03 NOTE — Progress Notes (Signed)
HPI  Christine Serrano presents to tthe clinic today to establish care and for management of the conditions listed below. She is transferring care from [facility].  Depression: She reports this is triggered by her history of alcohol abuse. She has noticed that it has been improving. She reports she is taking Effexor and Periactin as prescribed. She only uses the Valium about 3 times a month. She denies anxiety, SI/HI.  Chronic Constipation. She is taking Linzess and Amitiza as prescribed. With this regimen she has a BM about every other day. She denies abdominal pain or blood in her stool.  History of Alcohol Abuse: She has been sober for 9 months. She is not currently attending any AA  Meetings or support groups.   COPD: She reports she has a chronic cough but no real shortness of breath. She is taking Symbicort and Spiriva as prescribed. She has an Albuterol inhaler and Duoneb to use if needded. She needs a refill on the Spiriva today. She does continue to smoke, 1.5 ppd x 47 years. She follows with Dr. Alva Garnet. She reports she was referred to pulmonary rehab but has not started it yet.  Tremors: She feels like this is due to lack of alcohol.  The tremors come and go. When it occurs, it affects her ability to write and hold things. She takes Neurontin intermittently for this.   Insomnia: She has trouble falling and staying asleep. She takes Trazadone with good relief.   Flu: 04/2016 Tetanus: 2013 Pneumovax: 2014 Pap Smear: > 5 years ago Mammogram: 2013 Colon Screening: never Vision Screening: as needed Dentist: never-dentures  Past Medical History:  Diagnosis Date  . Chronic idiopathic constipation   . COPD (chronic obstructive pulmonary disease) ([place])   . Depression   . Endometriosis     Current Outpatient Prescriptions  Medication Sig Dispense Refill  . aspirin EC 81 MG tablet Take 81 mg by mouth daily.    . Cholecalciferol (VITAMIN D) 2000 units tablet Take 2,000 Units by mouth daily.     . cyproheptadine (PERIACTIN) 4 MG tablet Take 4 mg by mouth 2 (two) times daily.    . diazepam (VALIUM) 5 MG tablet Take 5 mg by mouth daily as needed for anxiety.    . gabapentin (NEURONTIN) 300 MG capsule Take 300 mg by mouth 3 (three) times daily.    Marland Kitchen ipratropium-albuterol (DUONEB) 0.5-2.5 (3) MG/3ML SOLN     . LINZESS 290 MCG CAPS capsule Take 290 mcg by mouth daily.    . Multiple Vitamins-Minerals (MULTIVITAMIN ADULT) TABS Take 1 tablet by mouth daily.    . SYMBICORT 160-4.5 MCG/ACT inhaler Inhale 2 puffs into the lungs 2 (two) times daily.    Marland Kitchen tiotropium (SPIRIVA) 18 MCG inhalation capsule Place 1 capsule (18 mcg total) into inhaler and inhale daily. 30 capsule 11  . traZODone (DESYREL) 50 MG tablet Take 100 mg by mouth at bedtime.    . varenicline (CHANTIX CONTINUING MONTH PAK) 1 MG tablet Take 1 tablet (1 mg total) by mouth 2 (two) times daily. 60 tablet 2  . venlafaxine XR (EFFEXOR-XR) 150 MG 24 hr capsule Take 150 mg by mouth daily with breakfast.    . VENTOLIN HFA 108 (90 Base) MCG/ACT inhaler Inhale 2 puffs into the lungs every 6 (six) hours as needed for shortness of breath.    . vitamin B-12 (CYANOCOBALAMIN) 1000 MCG tablet Take 1,000 mcg by mouth daily.    . vitamin E 400 UNIT capsule Take 400 Units by  mouth daily.      No current facility-administered medications for this visit.     Allergies  Allergen Reactions  . Imitrex [Sumatriptan] Other (See Comments)    Worsens migraine     Family History  Problem Relation Age of Onset  . Heart disease Mother   . Hypertension Mother   . Hypertension Father   . Hearing loss Maternal Grandmother   . Hypertension Maternal Grandmother   . Hearing loss Maternal Grandfather   . Hypertension Maternal Grandfather   . Colon cancer Paternal Grandmother     Social History   Social History  . Marital status: Divorced    Spouse name: N/A  . Number of children: N/A  . Years of education: N/A   Occupational History  . Not on  file.   Social History Main Topics  . Smoking status: Current Every Day Smoker    Packs/day: 1.00    Years: 47.00    Types: Cigarettes  . Smokeless tobacco: Never Used  . Alcohol use Yes     Comment: occasional  . Drug use: No  . Sexual activity: No   Other Topics Concern  . Not on file   Social History Narrative  . No narrative on file    ROS:  Constitutional: Denies fever, malaise, fatigue, headache or abrupt weight changes.  HEENT: Denies eye pain, eye redness, ear pain, ringing in the ears, wax buildup, runny nose, nasal congestion, bloody nose, or sore throat. Respiratory: Christine Serrano reports cough. Denies difficulty breathing, shortness of breath, or sputum production.   Cardiovascular: Denies chest pain, chest tightness, palpitations or swelling in the hands or feet.  Gastrointestinal: Denies abdominal pain, bloating, constipation, diarrhea or blood in the stool.  GU: Denies frequency, urgency, pain with urination, blood in urine, odor or discharge. Musculoskeletal: Denies decrease in range of motion, difficulty with gait, muscle pain or joint pain and swelling.  Skin: Denies redness, rashes, lesions or ulcercations.  Neurological: Christine Serrano reports intermittent tremor. Denies dizziness, difficulty with memory, difficulty with speech or problems with balance and coordination.  Psych: Christine Serrano reports history of depression. Denies anxiety, SI/HI.  No other specific complaints in a complete review of systems (except as listed in HPI above).  PE:  BP 126/82   Pulse (!) 118   Temp 98.2 F (36.8 C) (Oral)   Ht 5\' 5"  (1.651 m)   Wt 123 lb (55.8 kg)   SpO2 92%   BMI 20.47 kg/m  Wt Readings from Last 3 Encounters:  07/02/16 123 lb (55.8 kg)  06/11/16 117 lb 9.6 oz (53.3 kg)  05/13/16 114 lb 9.6 oz (52 kg)    General: Appears her stated age, well developed, well nourished in NAD. Neck: Neck supple, trachea midline. No masses, lumps or thyromegaly present.  Cardiovascular: Tachycardic  with normal  rhythm. S1,S2 noted.  No murmur, rubs or gallops noted. No JVD or BLE edema.  Pulmonary/Chest: Normal effort and positive vesicular breath sounds. No respiratory distress. No wheezes, rales or ronchi noted.  Neurological: Alert and oriented.  Psychiatric: Mood and affect normal. Behavior is normal. Judgment and thought content normal.     BMET    Component Value Date/Time   NA 139 04/10/2016 0254   K 4.4 04/10/2016 0254   CL 101 04/10/2016 0254   CO2 33 (H) 04/10/2016 0254   GLUCOSE 187 (H) 04/10/2016 0254   BUN 5 (L) 04/10/2016 0254   CREATININE 0.44 04/10/2016 0254   CALCIUM 8.3 (L) 04/10/2016 0254  GFRNONAA >60 04/10/2016 0254   GFRAA >60 04/10/2016 0254    Lipid Panel  No results found for: CHOL, TRIG, HDL, CHOLHDL, VLDL, LDLCALC  CBC    Component Value Date/Time   WBC 12.0 (H) 04/10/2016 0254   RBC 3.88 04/10/2016 0254   HGB 12.0 04/10/2016 0254   HCT 34.7 (L) 04/10/2016 0254   PLT 206 04/10/2016 0254   MCV 89.3 04/10/2016 0254   MCH 30.9 04/10/2016 0254   MCHC 34.6 04/10/2016 0254   RDW 14.0 04/10/2016 0254   LYMPHSABS 2.7 04/09/2016 1956   MONOABS 1.3 (H) 04/09/2016 1956   EOSABS 0.2 04/09/2016 1956   BASOSABS 0.1 04/09/2016 1956    Hgb A1C No results found for: HGBA1C   Assessment and Plan:

## 2016-07-09 ENCOUNTER — Telehealth: Payer: Self-pay | Admitting: Respiratory Therapy

## 2016-07-09 ENCOUNTER — Other Ambulatory Visit: Payer: Self-pay

## 2016-07-09 ENCOUNTER — Encounter: Payer: Self-pay | Admitting: Respiratory Therapy

## 2016-07-09 DIAGNOSIS — J449 Chronic obstructive pulmonary disease, unspecified: Secondary | ICD-10-CM

## 2016-07-09 NOTE — Telephone Encounter (Signed)
Christine Serrano called to inform [facility] that she would probably return to class 07/15/16. She is recovering from her illness and drives her roommate to work which is somewhat of a conflict.

## 2016-07-19 ENCOUNTER — Encounter: Payer: Commercial Managed Care - HMO | Attending: Pulmonary Disease

## 2016-07-19 DIAGNOSIS — J449 Chronic obstructive pulmonary disease, unspecified: Secondary | ICD-10-CM | POA: Insufficient documentation

## 2016-07-22 DIAGNOSIS — J449 Chronic obstructive pulmonary disease, unspecified: Secondary | ICD-10-CM | POA: Diagnosis not present

## 2016-07-29 ENCOUNTER — Encounter: Payer: Self-pay | Admitting: Respiratory Therapy

## 2016-07-29 DIAGNOSIS — J449 Chronic obstructive pulmonary disease, unspecified: Secondary | ICD-10-CM

## 2016-07-29 NOTE — Progress Notes (Signed)
Pulmonary Individual Treatment Plan ° °Patient Details  °Name: Christine Serrano °MRN: [id number] °Date of Birth: 07/30/1960 °Referring Provider:   °Flowsheet Row Pulmonary Rehab from 06/11/2016 in [facility]  °Referring Provider  Simonds, David MD  °  ° ° °Initial Encounter Date:  °Flowsheet Row Pulmonary Rehab from 06/11/2016 in [facility]  °Date  06/11/16  °Referring Provider  Simonds, David MD  °  ° ° °Visit Diagnosis: COPD, severe (HCC) ° °Patient's Home Medications on Admission: ° °Current Outpatient Prescriptions:  °•  AMITIZA 24 MCG capsule, , Disp: , Rfl:  °•  aspirin EC 81 MG tablet, Take 81 mg by mouth daily., Disp: , Rfl:  °•  Cholecalciferol (VITAMIN D) 2000 units tablet, Take 2,000 Units by mouth daily., Disp: , Rfl:  °•  cyproheptadine (PERIACTIN) 4 MG tablet, Take 4 mg by mouth 2 (two) times daily., Disp: , Rfl:  °•  diazepam (VALIUM) 5 MG tablet, Take 5 mg by mouth daily as needed for anxiety., Disp: , Rfl:  °•  gabapentin (NEURONTIN) 300 MG capsule, Take 300 mg by mouth 3 (three) times daily., Disp: , Rfl:  °•  ipratropium-albuterol (DUONEB) 0.5-2.5 (3) MG/3ML SOLN, , Disp: , Rfl:  °•  LINZESS 290 MCG CAPS capsule, Take 290 mcg by mouth daily., Disp: , Rfl:  °•  Multiple Vitamins-Minerals (MULTIVITAMIN ADULT) TABS, Take 1 tablet by mouth daily., Disp: , Rfl:  °•  SYMBICORT 160-4.5 MCG/ACT inhaler, Inhale 2 puffs into the lungs 2 (two) times daily., Disp: , Rfl:  °•  tiotropium (SPIRIVA) 18 MCG inhalation capsule, Place 1 capsule (18 mcg total) into inhaler and inhale daily., Disp: 30 capsule, Rfl: 11 °•  traZODone (DESYREL) 50 MG tablet, Take 100 mg by mouth at bedtime., Disp: , Rfl:  °•  varenicline (CHANTIX CONTINUING MONTH PAK) 1 MG tablet, Take 1 tablet (1 mg total) by mouth 2 (two) times daily., Disp: 60 tablet, Rfl: 2 °•  venlafaxine XR (EFFEXOR-XR) 150 MG 24 hr capsule, Take 150 mg by mouth daily with breakfast., Disp: , Rfl:  °•  VENTOLIN HFA 108 (90  Base) MCG/ACT inhaler, Inhale 2 puffs into the lungs every 6 (six) hours as needed for shortness of breath., Disp: , Rfl:  °•  vitamin B-12 (CYANOCOBALAMIN) 1000 MCG tablet, Take 1,000 mcg by mouth daily., Disp: , Rfl:  °•  vitamin E 400 UNIT capsule, Take 400 Units by mouth daily. , Disp: , Rfl:  ° °Past Medical History: °Past Medical History:  °Diagnosis Date  °• Chronic idiopathic constipation   °• COPD (chronic obstructive pulmonary disease) (HCC)   °• Depression   °• Endometriosis   ° ° °Tobacco Use: °History  °Smoking Status  °• Current Every Day Smoker  °• Packs/day: 1.00  °• Years: 47.00  °• Types: Cigarettes  °Smokeless Tobacco  °• Never Used  ° ° °Labs: °Recent Review Flowsheet Data   ° Labs for [facility] Cardiac and Pulmonary Rehab Latest Ref Rng & Units 04/04/2009 04/09/2016  ° PHART 7.350 - 7.450 7.302(L) 7.30(L)  ° PCO2ART 32.0 - 48.0 mmHg 59.5 CRITICAL RESULT CALLED TO, READ BACK BY AND VERIFIED WITH: WALKER,N.RN 04/04/09 AT 1440 CRITICAL RESULT CALLED TO, READ BACK BY AND VERIFIED WITH: WALKER,N.RN 04/04/09 AT 1440 BY BROADNAX,L.RRT(HH) 65(H)  ° HCO3 20.0 - 28.0 mmol/L 28.5(H) 32.0(H)  ° TCO2 0 - 100 mmol/L 25.9 -  ° O2SAT % 95.5 88.7  °  ° ° ° °ADL [facility]: °  °  °  Pulmonary Assessment Scores   ° Row Name 06/11/16 1447  °  °  °  ° ADL [facility]  ° ADL Phase Entry    ° SOB Score total 75    ° Rest 2    ° Walk 4    ° Stairs 5    ° Bath 3    ° Dress 3    ° Shop 5    °  ° ° °Pulmonary Function Assessment: °  °  °Pulmonary Function Assessment - 06/11/16 1445   °  ° Initial Spirometry Results  ° FVC% 31 %  ° FEV1% 27 %  ° FEV1/FVC Ratio 68.82  ° Comments Test Date 06/11/16  °  ° Post Bronchodilator Spirometry Results  ° FVC% 51 %  ° FEV1% 36 %  ° FEV1/FVC Ratio 59.71  °  ° Breath  ° Bilateral Breath Sounds Decreased;Expiratory;Wheezes  ° Shortness of Breath Yes;Limiting activity  °  ° ° °Exercise Target Goals: °  ° °Exercise Program Goal: °Individual exercise prescription set with THRR, safety & activity barriers.  Participant demonstrates ability to understand and report RPE using BORG scale, to self-measure pulse accurately, and to acknowledge the importance of the exercise prescription. ° °Exercise Prescription Goal: °Starting with aerobic activity 30 plus minutes a day, 3 days per week for initial exercise prescription. Provide home exercise prescription and guidelines that participant acknowledges understanding prior to discharge. ° °Activity Barriers & Risk Stratification: °  °  °Activity Barriers & Cardiac Risk Stratification - 06/11/16 1445   °  ° Activity Barriers & Cardiac Risk Stratification  ° Activity Barriers Shortness of Breath;Deconditioning;Muscular Weakness  ° Cardiac Risk Stratification Moderate  °  ° ° °6 Minute Walk: °  °  °6 Minute Walk   ° Row Name 06/11/16 1447  °  °  °  ° 6 Minute Walk  ° Phase Initial    ° Distance 920 feet    ° Walk Time 5.3 minutes    ° # of Rest Breaks 2  °14 sec, 28 sec    ° MPH 1.97    ° METS 3.75    ° RPE 15    ° Perceived Dyspnea  7    ° VO2 Peak 13.12    ° Symptoms Yes (comment)    ° Comments fatigue and SOB    ° Resting HR 118 bpm    ° Resting BP 126/56    ° Max Ex. HR 146 bpm    ° Max Ex. BP 128/64    ° 2 Minute Post BP 110/60    °  ° Interval HR  ° Baseline HR 118    ° 1 Minute HR 124    ° 2 Minute HR 139    ° 3 Minute HR 143    ° 4 Minute HR 136    ° 5 Minute HR 144    ° 6 Minute HR 146    ° 2 Minute Post HR 122    ° Interval Heart Rate? Yes    °  ° Interval Oxygen  ° Interval Oxygen? Yes    ° Baseline Oxygen Saturation % 94 %  °95% intermitten on her concentrater    ° Baseline Liters of Oxygen 2 L    ° 1 Minute Oxygen Saturation % 88 %  °stopped at 1:27 82%    ° 1 Minute Liters of Oxygen 2 L    ° 2 Minute Oxygen Saturation % 89 %    °   2 Minute Liters of Oxygen 2 L     3 Minute Oxygen Saturation % 90 %  stopped for rest at 3:13 86%     3 Minute Liters of Oxygen 2 L     4 Minute Oxygen Saturation % 86 %     4 Minute Liters of Oxygen 2 L     5 Minute Oxygen Saturation  % 88 %     5 Minute Liters of Oxygen 2 L     6 Minute Oxygen Saturation % 88 %     6 Minute Liters of Oxygen 2 L     2 Minute Post Oxygen Saturation % 92 %     2 Minute Post Liters of Oxygen 2 L        Initial Exercise Prescription:     Initial Exercise Prescription - 06/11/16 1400      Date of Initial Exercise RX and Referring Provider   Date 06/11/16   Referring Provider Merton Border MD     Oxygen   Oxygen Continuous   Liters 3     Treadmill   MPH 1.5   Grade 0   Minutes 15  3 min x5   METs 2.15     NuStep   Level 2   Watts --  60-80 spm   Minutes 15   METs 2     REL-XR   Level 1   Watts --  speed 40-50   Minutes 15   METs 2     Prescription Details   Frequency (times per week) 3   Duration Progress to 45 minutes of aerobic exercise without signs/symptoms of physical distress     Intensity   THRR 40-80% of Max Heartrate 136-154   Ratings of Perceived Exertion 11-15   Perceived Dyspnea 0-4     Progression   Progression Continue to progress workloads to maintain intensity without signs/symptoms of physical distress.     Resistance Training   Training Prescription Yes   Weight 2 lbs   Reps 10-15      Perform Capillary Blood Glucose checks as needed.  Exercise Prescription Changes:     Exercise Prescription Changes    Row Name 06/11/16 1400             Exercise Review   Progression --  walk test results         Response to Exercise   Blood Pressure (Admit) 126/56       Blood Pressure (Exercise) 128/64       Blood Pressure (Exit) 110/60       Heart Rate (Admit) 118 bpm       Heart Rate (Exercise) 146 bpm       Heart Rate (Exit) 122 bpm       Oxygen Saturation (Admit) 94 %       Oxygen Saturation (Exercise) 82 %       Oxygen Saturation (Exit) 92 %       Rating of Perceived Exertion (Exercise) 15       Perceived Dyspnea (Exercise) 7       Symptoms fatigue and SOB          Exercise Comments:     Exercise Comments    Row  Name 06/11/16 1503           Exercise Comments Danielly wants to be able to go out and walk and to able to play with her dog.  Discharge Exercise Prescription (Final Exercise Prescription Changes):     Exercise Prescription Changes - 06/11/16 1400      Exercise Review   Progression --  walk test results     Response to Exercise   Blood Pressure (Admit) 126/56   Blood Pressure (Exercise) 128/64   Blood Pressure (Exit) 110/60   Heart Rate (Admit) 118 bpm   Heart Rate (Exercise) 146 bpm   Heart Rate (Exit) 122 bpm   Oxygen Saturation (Admit) 94 %   Oxygen Saturation (Exercise) 82 %   Oxygen Saturation (Exit) 92 %   Rating of Perceived Exertion (Exercise) 15   Perceived Dyspnea (Exercise) 7   Symptoms fatigue and SOB       Nutrition:  Target Goals: Understanding of nutrition guidelines, daily intake of sodium 1500mg , cholesterol 200mg , calories 30% from fat and 7% or less from saturated fats, daily to have 5 or more servings of fruits and vegetables.  Biometrics:     Pre Biometrics - 06/11/16 1503      Pre Biometrics   Height 5' 4.9" (1.648 m)   Weight 117 lb 9.6 oz (53.3 kg)   Waist Circumference 32.5 inches   Hip Circumference 35 inches   Waist to Hip Ratio 0.93 %   BMI (Calculated) 19.7       Nutrition Therapy Plan and Nutrition Goals:   Nutrition Discharge: Rate Your Plate Scores:   Psychosocial: Target Goals: Acknowledge presence or absence of depression, maximize coping skills, provide positive support system. Participant is able to verbalize types and ability to use techniques and skills needed for reducing stress and depression.  Initial Review & Psychosocial Screening:     Initial Psych Review & Screening - 06/11/16 [place]? Yes   Comments Christine Serrano has good wupport from her sister-in-law who lives with Christine Serrano. Christine Serrano has had much loss in her life this past year - her boyfriend of 23  years, father-in-law , a few other relatives, and her horse of 18 years, plus her dog of 18 years. She will benefit from meeting with our Health Counselor, Lucianne Lei and participation in [place].     Barriers   Psychosocial barriers to participate in program The patient should benefit from training in stress management and relaxation.     Screening Interventions   Interventions Program counselor consult;Encouraged to exercise      Quality of Life Scores:     Quality of Life - 06/11/16 1502      Quality of Life Scores   Health/Function Pre 20.63 %   Socioeconomic Pre 19.86 %   Psych/Spiritual Pre 20.57 %   Family Pre 20.2 %   GLOBAL Pre 20.4 %      PHQ-9: Recent Review Flowsheet Data    Depression screen [facility] 2/9 06/11/2016   Decreased Interest 2   Down, Depressed, Hopeless 1   PHQ - 2 Score 3   Altered sleeping 1   Tired, decreased energy 3   Change in appetite 0   Feeling bad or failure about yourself  1   Trouble concentrating 0   Moving slowly or fidgety/restless 0   Suicidal thoughts 0   PHQ-9 Score 8   Difficult doing work/chores Very difficult      Psychosocial Evaluation and Intervention:   Psychosocial Re-Evaluation:  Education: Education Goals: Education classes will be provided on a weekly basis, covering required topics. Participant will state understanding/return demonstration of  topics presented.  Learning Barriers/Preferences:     Learning Barriers/Preferences - 06/11/16 1445      Learning Barriers/Preferences   Learning Barriers None   Learning Preferences None      Education Topics: Initial Evaluation Education: - Verbal, written and demonstration of respiratory meds, RPE/PD scales, oximetry and breathing techniques. Instruction on use of nebulizers and MDIs: cleaning and proper use, rinsing mouth with steroid doses and importance of monitoring MDI activations. Flowsheet Row Pulmonary Rehab from 06/11/2016 in [facility] Cardiac and  Pulmonary Rehab  Date  06/11/16  Educator  LB  Instruction Review Code  2- meets goals/outcomes      General Nutrition Guidelines/Fats and Fiber: -Group instruction provided by verbal, written material, models and posters to present the general guidelines for heart healthy nutrition. Gives an explanation and review of dietary fats and fiber.   Controlling Sodium/Reading Food Labels: -Group verbal and written material supporting the discussion of sodium use in heart healthy nutrition. Review and explanation with models, verbal and written materials for utilization of the food label.   Exercise Physiology & Risk Factors: - Group verbal and written instruction with models to review the exercise physiology of the cardiovascular system and associated critical values. Details cardiovascular disease risk factors and the goals associated with each risk factor.   Aerobic Exercise & Resistance Training: - Gives group verbal and written discussion on the health impact of inactivity. On the components of aerobic and resistive training programs and the benefits of this training and how to safely progress through these programs.   Flexibility, Balance, General Exercise Guidelines: - Provides group verbal and written instruction on the benefits of flexibility and balance training programs. Provides general exercise guidelines with specific guidelines to those with heart or lung disease. Demonstration and skill practice provided.   Stress Management: - Provides group verbal and written instruction about the health risks of elevated stress, cause of high stress, and healthy ways to reduce stress.   Depression: - Provides group verbal and written instruction on the correlation between heart/lung disease and depressed mood, treatment options, and the stigmas associated with seeking treatment.   Exercise & Equipment Safety: - Individual verbal instruction and demonstration of equipment use and safety  with use of the equipment.   Infection Prevention: - Provides verbal and written material to individual with discussion of infection control including proper hand washing and proper equipment cleaning during exercise session. Flowsheet Row Pulmonary Rehab from 06/11/2016 in [facility] and Pulmonary Rehab  Date  06/11/16  Educator  LB  Instruction Review Code  2- meets goals/outcomes      Falls Prevention: - Provides verbal and written material to individual with discussion of falls prevention and safety. Flowsheet Row Pulmonary Rehab from 06/11/2016 in [facility] Cardiac and Pulmonary Rehab  Date  06/11/16  Educator  LB  Instruction Review Code  2- meets goals/outcomes      Diabetes: - Individual verbal and written instruction to review signs/symptoms of diabetes, desired ranges of glucose level fasting, after meals and with exercise. Advice that pre and post exercise glucose checks will be done for 3 sessions at entry of program.   Chronic Lung Diseases: - Group verbal and written instruction to review new updates, new respiratory medications, new advancements in procedures and treatments. Provide informative websites and "800" numbers of self-education.   Lung Procedures: - Group verbal and written instruction to describe testing methods done to diagnose lung disease. Review the outcome of test results. Describe the treatment choices: Pulmonary  Function Tests, ABGs and oximetry.   Energy Conservation: - Provide group verbal and written instruction for methods to conserve energy, plan and organize activities. Instruct on pacing techniques, use of adaptive equipment and posture/positioning to relieve shortness of breath.   Triggers: - Group verbal and written instruction to review types of environmental controls: home humidity, furnaces, filters, dust mite/pet prevention, HEPA vacuums. To discuss weather changes, air quality and the benefits of nasal washing.   Exacerbations: -  Group verbal and written instruction to provide: warning signs, infection symptoms, calling MD promptly, preventive modes, and value of vaccinations. Review: effective airway clearance, coughing and/or vibration techniques. Create an Sports administrator.   Oxygen: - Individual and group verbal and written instruction on oxygen therapy. Includes supplement oxygen, available portable oxygen systems, continuous and intermittent flow rates, oxygen safety, concentrators, and Medicare reimbursement for oxygen. Flowsheet Row Pulmonary Rehab from 06/11/2016 in [facility] Cardiac and Pulmonary Rehab  Date  06/11/16  Educator  LB  Instruction Review Code  2- meets goals/outcomes      Respiratory Medications: - Group verbal and written instruction to review medications for lung disease. Drug class, frequency, complications, importance of spacers, rinsing mouth after steroid MDI's, and proper cleaning methods for nebulizers. Flowsheet Row Pulmonary Rehab from 06/11/2016 in [facility] Cardiac and Pulmonary Rehab  Date  06/11/16  Educator  LB  Instruction Review Code  2- meets goals/outcomes      AED/CPR: - Group verbal and written instruction with the use of models to demonstrate the basic use of the AED with the basic ABC's of resuscitation.   Breathing Retraining: - Provides individuals verbal and written instruction on purpose, frequency, and proper technique of diaphragmatic breathing and pursed-lipped breathing. Applies individual practice skills. Flowsheet Row Pulmonary Rehab from 06/11/2016 in [facility] Cardiac and Pulmonary Rehab  Date  06/11/16  Educator  LB  Instruction Review Code  2- meets goals/outcomes      Anatomy and Physiology of the Lungs: - Group verbal and written instruction with the use of models to provide basic lung anatomy and physiology related to function, structure and complications of lung disease.   Heart Failure: - Group verbal and written instruction on the basics of heart failure:  signs/symptoms, treatments, explanation of ejection fraction, enlarged heart and cardiomyopathy.   Sleep Apnea: - Individual verbal and written instruction to review Obstructive Sleep Apnea. Review of risk factors, methods for diagnosing and types of masks and machines for OSA.   Anxiety: - Provides group, verbal and written instruction on the correlation between heart/lung disease and anxiety, treatment options, and management of anxiety.   Relaxation: - Provides group, verbal and written instruction about the benefits of relaxation for patients with heart/lung disease. Also provides patients with examples of relaxation techniques.   Knowledge Questionnaire Score:     Knowledge Questionnaire Score - 06/11/16 1445      Knowledge Questionnaire Score   Pre Score 7/10       Core Components/Risk Factors/Patient Goals at Admission:     Personal Goals and Risk Factors at Admission - 06/11/16 1452      Core Components/Risk Factors/Patient Goals on Admission    Weight Management Yes;Weight Maintenance;Weight Gain   Intervention Weight Management: Develop a combined nutrition and exercise program designed to reach desired caloric intake, while maintaining appropriate intake of nutrient and fiber, sodium and fats, and appropriate energy expenditure required for the weight goal.;Weight Management: Provide education and appropriate resources to help participant work on and attain dietary goals.  Admit Weight 117 lb 9.6 oz (53.3 kg)   Goal Weight: Short Term 120 lb (54.4 kg)   Goal Weight: Long Term 122 lb (55.3 kg)   Expected Outcomes Weight Gain: Understanding of general recommendations for a high calorie, high protein meal plan that promotes weight gain by distributing calorie intake throughout the day with the consumption for 4-5 meals, snacks, and/or supplements;Short Term: Continue to assess and modify interventions until short term weight is achieved;Long Term: Adherence to nutrition  and physical activity/exercise program aimed toward attainment of established weight goal   Sedentary Yes   Intervention Provide advice, education, support and counseling about physical activity/exercise needs.;Develop an individualized exercise prescription for aerobic and resistive training based on initial evaluation findings, risk stratification, comorbidities and participant's personal goals.   Expected Outcomes Achievement of increased cardiorespiratory fitness and enhanced flexibility, muscular endurance and strength shown through measurements of functional capacity and personal statement of participant.   Increase Strength and Stamina Yes   Intervention Provide advice, education, support and counseling about physical activity/exercise needs.;Develop an individualized exercise prescription for aerobic and resistive training based on initial evaluation findings, risk stratification, comorbidities and participant's personal goals.   Expected Outcomes Achievement of increased cardiorespiratory fitness and enhanced flexibility, muscular endurance and strength shown through measurements of functional capacity and personal statement of participant.   Improve shortness of breath with ADL's Yes   Intervention Provide education, individualized exercise plan and daily activity instruction to help decrease symptoms of SOB with activities of daily living.   Expected Outcomes Short Term: Achieves a reduction of symptoms when performing activities of daily living.   Develop more efficient breathing techniques such as purse lipped breathing and diaphragmatic breathing; and practicing self-pacing with activity Yes   Intervention Provide education, demonstration and support about specific breathing techniuqes utilized for more efficient breathing. Include techniques such as pursed lipped breathing, diaphragmatic breathing and self-pacing activity.   Expected Outcomes Short Term: Participant will be able to  demonstrate and use breathing techniques as needed throughout daily activities.   Increase knowledge of respiratory medications and ability to use respiratory devices properly  Yes   Intervention Provide education and demonstration as needed of appropriate use of medications, inhalers, and oxygen therapy.  Ventolin, Symbicort, Spiriva MDI's; Duoneb; Oxygen 2l/m   Expected Outcomes Short Term: Achieves understanding of medications use. Understands that oxygen is a medication prescribed by physician. Demonstrates appropriate use of inhaler and oxygen therapy.      Core Components/Risk Factors/Patient Goals Review:    Core Components/Risk Factors/Patient Goals at Discharge (Final Review):    ITP Comments:     ITP Comments    Row Name 06/26/16 1535 07/09/16 0729         ITP Comments Called Christine Serrano who had an appointment to start [place] on 06/24/16. She has been sick , has an appointment on 07/02/16 with her physician, and plans to start exercise in [place] as soon as she recovers from her illness. Christine Serrano called to inform [facility] that she would probably return to class 07/15/16. She is recovering from her illness and drives her roommate to work which is somewhat of a conflict.         Comments: 30 Day Note Review

## 2016-07-31 ENCOUNTER — Telehealth: Payer: Self-pay | Admitting: Internal Medicine

## 2016-07-31 NOTE — Telephone Encounter (Signed)
Pt called - she got a bill for additional 35.00 as a specialist.  Date of service 07/02/16.   cb number for pt is [phone] or cell [phone].

## 2016-08-01 NOTE — Telephone Encounter (Signed)
The billing provider should have been Arnette Norris.  I have corrected and requested to be re-filed.  Please notify patient if she gets another bill to disregard it, but if she gets one after that, give [place] a call back.  Thank you!

## 2016-08-06 ENCOUNTER — Ambulatory Visit: Payer: Commercial Managed Care - HMO | Admitting: Pulmonary Disease

## 2016-08-07 DIAGNOSIS — J449 Chronic obstructive pulmonary disease, unspecified: Secondary | ICD-10-CM

## 2016-08-07 NOTE — Progress Notes (Signed)
Daily Session Note  Patient Details  Name: Christine Serrano MRN: [id number] Date of Birth: 10-Oct-1960 Referring Provider:   Flowsheet Row Pulmonary Rehab from 06/11/2016 in [facility] Card[facility]  Referring Provider  Merton Border MD      Encounter Date: 08/07/2016  Check In:     Session Check In - 08/07/16 1226      Check-In   Location [facility]-Cardiac & Pulmonary Rehab   Staff Present Carson Myrtle, BS, RRT, Respiratory Lennie Hummer, MA, ACSM RCEP, Exercise Physiologist;Amanda Oletta Darter, BA, ACSM CEP, Exercise Physiologist   Supervising physician immediately available to respond to emergencies [facility] immediately available ER MD   Physician(s) Quentin Cornwall and Jimmye Norman   Medication changes reported     No   Fall or balance concerns reported    No   Warm-up and Cool-down Performed as group-led Location manager Performed Yes   VAD Patient? No     Pain Assessment   Currently in Pain? No/denies   Multiple Pain Sites No         Goals Met:  Proper associated with RPD/PD & O2 Sat Exercise tolerated well Strength training completed today  Goals Unmet:  Not Applicable  Comments: First full day of exercise!  Patient was oriented to gym and equipment including functions, settings, policies, and procedures.  Patient's individual exercise prescription and treatment plan were reviewed.  All starting workloads were established based on the results of the 6 minute walk test done at initial orientation visit.  The plan for exercise progression was also introduced and progression will be customized based on patient's performance and goals.    Dr. Emily Filbert is Medical Director for [place] and [facility].

## 2016-08-09 ENCOUNTER — Telehealth: Payer: Self-pay | Admitting: *Deleted

## 2016-08-09 ENCOUNTER — Encounter: Payer: Self-pay | Admitting: *Deleted

## 2016-08-09 NOTE — Telephone Encounter (Signed)
Christine Serrano called to say she was sorry that she overslept today and she would not be able to attend the 11:30am Pulm Rehab class today either. Christine Serrano reported that the first Pulmonary Rehab class helped her and she has cut back on her smoking because of that first [facility] Rehab session.

## 2016-08-16 ENCOUNTER — Encounter: Payer: Commercial Managed Care - HMO | Attending: Pulmonary Disease

## 2016-08-16 DIAGNOSIS — J449 Chronic obstructive pulmonary disease, unspecified: Secondary | ICD-10-CM | POA: Insufficient documentation

## 2016-08-19 DIAGNOSIS — J449 Chronic obstructive pulmonary disease, unspecified: Secondary | ICD-10-CM | POA: Diagnosis not present

## 2016-08-20 NOTE — Telephone Encounter (Signed)
error 

## 2016-08-21 ENCOUNTER — Encounter: Payer: Self-pay | Admitting: Respiratory Therapy

## 2016-08-21 DIAGNOSIS — J449 Chronic obstructive pulmonary disease, unspecified: Secondary | ICD-10-CM

## 2016-08-23 ENCOUNTER — Telehealth: Payer: Self-pay | Admitting: *Deleted

## 2016-08-23 NOTE — Telephone Encounter (Signed)
No obvious red flags for change in MD. Rollene Fare what are your thoughts?

## 2016-08-23 NOTE — Telephone Encounter (Signed)
PT would like to switch primary care provider to Dr. Diona Browner. Would that be okay?

## 2016-08-26 ENCOUNTER — Encounter: Payer: Self-pay | Admitting: Respiratory Therapy

## 2016-08-26 DIAGNOSIS — J449 Chronic obstructive pulmonary disease, unspecified: Secondary | ICD-10-CM

## 2016-08-26 NOTE — Progress Notes (Signed)
Pulmonary Individual Treatment Plan ° °Patient Details  °Name: Christine Serrano °MRN: [id number] °Date of Birth: 02/19/1961 °Referring Provider:   °Flowsheet Row Pulmonary Rehab from 06/11/2016 in [facility]  °Referring Provider  Simonds, David MD  °  ° ° °Initial Encounter Date:  °Flowsheet Row Pulmonary Rehab from 06/11/2016 in [facility]  °Date  06/11/16  °Referring Provider  Simonds, David MD  °  ° ° °Visit Diagnosis: COPD, severe (HCC) ° °Patient's Home Medications on Admission: ° °Current Outpatient Prescriptions:  °•  AMITIZA 24 MCG capsule, , Disp: , Rfl:  °•  aspirin EC 81 MG tablet, Take 81 mg by mouth daily., Disp: , Rfl:  °•  Cholecalciferol (VITAMIN D) 2000 units tablet, Take 2,000 Units by mouth daily., Disp: , Rfl:  °•  cyproheptadine (PERIACTIN) 4 MG tablet, Take 4 mg by mouth 2 (two) times daily., Disp: , Rfl:  °•  diazepam (VALIUM) 5 MG tablet, Take 5 mg by mouth daily as needed for anxiety., Disp: , Rfl:  °•  gabapentin (NEURONTIN) 300 MG capsule, Take 300 mg by mouth 3 (three) times daily., Disp: , Rfl:  °•  ipratropium-albuterol (DUONEB) 0.5-2.5 (3) MG/3ML SOLN, , Disp: , Rfl:  °•  LINZESS 290 MCG CAPS capsule, Take 290 mcg by mouth daily., Disp: , Rfl:  °•  Multiple Vitamins-Minerals (MULTIVITAMIN ADULT) TABS, Take 1 tablet by mouth daily., Disp: , Rfl:  °•  SYMBICORT 160-4.5 MCG/ACT inhaler, Inhale 2 puffs into the lungs 2 (two) times daily., Disp: , Rfl:  °•  tiotropium (SPIRIVA) 18 MCG inhalation capsule, Place 1 capsule (18 mcg total) into inhaler and inhale daily., Disp: 30 capsule, Rfl: 11 °•  traZODone (DESYREL) 50 MG tablet, Take 100 mg by mouth at bedtime., Disp: , Rfl:  °•  varenicline (CHANTIX CONTINUING MONTH PAK) 1 MG tablet, Take 1 tablet (1 mg total) by mouth 2 (two) times daily., Disp: 60 tablet, Rfl: 2 °•  venlafaxine XR (EFFEXOR-XR) 150 MG 24 hr capsule, Take 150 mg by mouth daily with breakfast., Disp: , Rfl:  °•  VENTOLIN HFA 108 (90  Base) MCG/ACT inhaler, Inhale 2 puffs into the lungs every 6 (six) hours as needed for shortness of breath., Disp: , Rfl:  °•  vitamin B-12 (CYANOCOBALAMIN) 1000 MCG tablet, Take 1,000 mcg by mouth daily., Disp: , Rfl:  °•  vitamin E 400 UNIT capsule, Take 400 Units by mouth daily. , Disp: , Rfl:  ° °Past Medical History: °Past Medical History:  °Diagnosis Date  °• Chronic idiopathic constipation   °• COPD (chronic obstructive pulmonary disease) (HCC)   °• Depression   °• Endometriosis   ° ° °Tobacco Use: °History  °Smoking Status  °• Current Every Day Smoker  °• Packs/day: 1.00  °• Years: 47.00  °• Types: Cigarettes  °Smokeless Tobacco  °• Never Used  ° ° °Labs: °Recent Review Flowsheet Data   ° Labs for [facility] Cardiac and Pulmonary Rehab Latest Ref Rng & Units 04/04/2009 04/09/2016  ° PHART 7.350 - 7.450 7.302(L) 7.30(L)  ° PCO2ART 32.0 - 48.0 mmHg 59.5 CRITICAL RESULT CALLED TO, READ BACK BY AND VERIFIED WITH: WALKER,N.RN 04/04/09 AT 1440 CRITICAL RESULT CALLED TO, READ BACK BY AND VERIFIED WITH: WALKER,N.RN 04/04/09 AT 1440 BY BROADNAX,L.RRT(HH) 65(H)  ° HCO3 20.0 - 28.0 mmol/L 28.5(H) 32.0(H)  ° TCO2 0 - 100 mmol/L 25.9 -  ° O2SAT % 95.5 88.7  °  ° ° ° °ADL [facility]: °  °  °  Pulmonary Assessment Scores   ° Row Name 06/11/16 1447  °  °  °  ° ADL UCSD  ° ADL Phase Entry    ° SOB Score total 75    ° Rest 2    ° Walk 4    ° Stairs 5    ° Bath 3    ° Dress 3    ° Shop 5    °  ° ° °Pulmonary Function Assessment: °  °  °Pulmonary Function Assessment - 06/11/16 1445   °  ° Initial Spirometry Results  ° FVC% 31 %  ° FEV1% 27 %  ° FEV1/FVC Ratio 68.82  ° Comments Test Date 06/11/16  °  ° Post Bronchodilator Spirometry Results  ° FVC% 51 %  ° FEV1% 36 %  ° FEV1/FVC Ratio 59.71  °  ° Breath  ° Bilateral Breath Sounds Decreased;Expiratory;Wheezes  ° Shortness of Breath Yes;Limiting activity  °  ° ° °Exercise Target Goals: °  ° °Exercise Program Goal: °Individual exercise prescription set with THRR, safety & activity barriers.  Participant demonstrates ability to understand and report RPE using BORG scale, to self-measure pulse accurately, and to acknowledge the importance of the exercise prescription. ° °Exercise Prescription Goal: °Starting with aerobic activity 30 plus minutes a day, 3 days per week for initial exercise prescription. Provide home exercise prescription and guidelines that participant acknowledges understanding prior to discharge. ° °Activity Barriers & Risk Stratification: °  °  °Activity Barriers & Cardiac Risk Stratification - 06/11/16 1445   °  ° Activity Barriers & Cardiac Risk Stratification  ° Activity Barriers Shortness of Breath;Deconditioning;Muscular Weakness  ° Cardiac Risk Stratification Moderate  °  ° ° °6 Minute Walk: °  °  °6 Minute Walk   ° Row Name 06/11/16 1447  °  °  °  ° 6 Minute Walk  ° Phase Initial    ° Distance 920 feet    ° Walk Time 5.3 minutes    ° # of Rest Breaks 2  °14 sec, 28 sec    ° MPH 1.97    ° METS 3.75    ° RPE 15    ° Perceived Dyspnea  7    ° VO2 Peak 13.12    ° Symptoms Yes (comment)    ° Comments fatigue and SOB    ° Resting HR 118 bpm    ° Resting BP 126/56    ° Max Ex. HR 146 bpm    ° Max Ex. BP 128/64    ° 2 Minute Post BP 110/60    °  ° Interval HR  ° Baseline HR 118    ° 1 Minute HR 124    ° 2 Minute HR 139    ° 3 Minute HR 143    ° 4 Minute HR 136    ° 5 Minute HR 144    ° 6 Minute HR 146    ° 2 Minute Post HR 122    ° Interval Heart Rate? Yes    °  ° Interval Oxygen  ° Interval Oxygen? Yes    ° Baseline Oxygen Saturation % 94 %  °95% intermitten on her concentrater    ° Baseline Liters of Oxygen 2 L    ° 1 Minute Oxygen Saturation % 88 %  °stopped at 1:27 82%    ° 1 Minute Liters of Oxygen 2 L    ° 2 Minute Oxygen Saturation % 89 %    °   2 Minute Liters of Oxygen 2 L     3 Minute Oxygen Saturation % 90 %  stopped for rest at 3:13 86%     3 Minute Liters of Oxygen 2 L     4 Minute Oxygen Saturation % 86 %     4 Minute Liters of Oxygen 2 L     5 Minute Oxygen Saturation  % 88 %     5 Minute Liters of Oxygen 2 L     6 Minute Oxygen Saturation % 88 %     6 Minute Liters of Oxygen 2 L     2 Minute Post Oxygen Saturation % 92 %     2 Minute Post Liters of Oxygen 2 L       Oxygen Initial Assessment:     Oxygen Initial Assessment - 08/26/16 0834      Home Oxygen   Home Oxygen Device Portable Concentrator;Home Concentrator   Sleep Oxygen Prescription Continuous   Liters per minute 2   Home Exercise Oxygen Prescription Continuous   Liters per minute 2   Home at Rest Exercise Oxygen Prescription Continuous   Liters per minute 2     Program Oxygen Prescription   Program Oxygen Prescription Continuous;E-Tanks   Liters per minute 2      Oxygen Re-Evaluation:   Oxygen Discharge (Final Oxygen Re-Evaluation):   Initial Exercise Prescription:     Initial Exercise Prescription - 06/11/16 1400      Date of Initial Exercise RX and Referring Provider   Date 06/11/16   Referring Provider Merton Border MD     Oxygen   Oxygen Continuous   Liters 3     Treadmill   MPH 1.5   Grade 0   Minutes 15  3 min x5   METs 2.15     NuStep   Level 2   SPM --  60-80 spm   Minutes 15   METs 2     REL-XR   Level 1   Watts --  speed 40-50   Minutes 15   METs 2     Prescription Details   Frequency (times per week) 3   Duration Progress to 45 minutes of aerobic exercise without signs/symptoms of physical distress     Intensity   THRR 40-80% of Max Heartrate 136-154   Ratings of Perceived Exertion 11-15   Perceived Dyspnea 0-4     Progression   Progression Continue to progress workloads to maintain intensity without signs/symptoms of physical distress.     Resistance Training   Training Prescription Yes   Weight 2 lbs   Reps 10-15      Perform Capillary Blood Glucose checks as needed.  Exercise Prescription Changes:     Exercise Prescription Changes    Row Name 06/11/16 1400 08/07/16 1200           Response to Exercise   Blood  Pressure (Admit) 126/56 130/72      Blood Pressure (Exercise) 128/64 124/74      Blood Pressure (Exit) 110/60 126/72      Heart Rate (Admit) 118 bpm 123 bpm      Heart Rate (Exercise) 146 bpm 155 bpm      Heart Rate (Exit) 122 bpm 83 bpm      Oxygen Saturation (Admit) 94 % 93 %      Oxygen Saturation (Exercise) 82 % 90 %      Oxygen Saturation (Exit) 92 % 97 %  Rating of Perceived Exertion (Exercise) 15 11     ° Perceived Dyspnea (Exercise) 7 2     ° Symptoms fatigue and SOB  --     ° Duration  -- Progress to 45 minutes of aerobic exercise without signs/symptoms of physical distress     ° Intensity  -- THRR unchanged     °  ° Progression  ° Progression  -- Continue to progress workloads to maintain intensity without signs/symptoms of physical distress.     °  ° Resistance Training  ° Training Prescription  -- Yes     ° Weight  -- 2     ° Reps  -- 10-15     °  ° Interval Training  ° Interval Training  -- No     °  ° Oxygen  ° Oxygen  -- Continuous     ° Liters  -- 3     °  ° Bike  ° Level  -- 1  °AD     ° Minutes  -- 8     °  ° NuStep  ° Level  -- 2     ° Minutes  -- 16     ° METs  -- 2.1     °  ° Exercise Review  ° Progression --  °walk test results  --     °  ° ° °Exercise Comments: °  °  °Exercise Comments   ° Row Name 06/11/16 1503 08/07/16 1229  °  °  °  ° Exercise Comments Tamrah wants to be able to go out and walk and to able to play with her dog. First full day of exercise!  Patient was oriented to gym and equipment including functions, settings, policies, and procedures.  Patient's individual exercise prescription and treatment plan were reviewed.  All starting workloads were established based on the results of the 6 minute walk test done at initial orientation visit.  The plan for exercise progression was also introduced and progression will be customized based on patient's performance and goals.     °  ° ° °Exercise Goals and Review: ° ° °Exercise Goals Re-Evaluation : ° ° °Discharge Exercise  Prescription (Final Exercise Prescription Changes): °  °  °Exercise Prescription Changes - 08/07/16 1200   °  ° Response to Exercise  ° Blood Pressure (Admit) 130/72  ° Blood Pressure (Exercise) 124/74  ° Blood Pressure (Exit) 126/72  ° Heart Rate (Admit) 123 bpm  ° Heart Rate (Exercise) 155 bpm  ° Heart Rate (Exit) 83 bpm  ° Oxygen Saturation (Admit) 93 %  ° Oxygen Saturation (Exercise) 90 %  ° Oxygen Saturation (Exit) 97 %  ° Rating of Perceived Exertion (Exercise) 11  ° Perceived Dyspnea (Exercise) 2  ° Duration Progress to 45 minutes of aerobic exercise without signs/symptoms of physical distress  ° Intensity THRR unchanged  °  ° Progression  ° Progression Continue to progress workloads to maintain intensity without signs/symptoms of physical distress.  °  ° Resistance Training  ° Training Prescription Yes  ° Weight 2  ° Reps 10-15  °  ° Interval Training  ° Interval Training No  °  ° Oxygen  ° Oxygen Continuous  ° Liters 3  °  ° Bike  ° Level 1  °AD  ° Minutes 8  °  ° NuStep  ° Level 2  ° Minutes 16  ° METs 2.1  °  ° ° °Nutrition:  °Target Goals: Understanding   of nutrition guidelines, daily intake of sodium <1500mg, cholesterol <200mg, calories 30% from fat and 7% or less from saturated fats, daily to have 5 or more servings of fruits and vegetables. ° °Biometrics: °  °  °Pre Biometrics - 06/11/16 1503   °  ° Pre Biometrics  ° Height 5' 4.9" (1.648 m)  ° Weight 117 lb 9.6 oz (53.3 kg)  ° Waist Circumference 32.5 inches  ° Hip Circumference 35 inches  ° Waist to Hip Ratio 0.93 %  ° BMI (Calculated) 19.7  °  ° ° ° °Nutrition Therapy Plan and Nutrition Goals: ° ° °Nutrition Discharge: Rate Your Plate Scores: ° ° °Nutrition Goals Re-Evaluation: ° ° °Nutrition Goals Discharge (Final Nutrition Goals Re-Evaluation): ° ° °Psychosocial: °Target Goals: Acknowledge presence or absence of significant depression and/or stress, maximize coping skills, provide positive support system. Participant is able to verbalize types  and ability to use techniques and skills needed for reducing stress and depression.  ° °Initial Review & Psychosocial Screening: °  °  °Initial Psych Review & Screening - 06/11/16 1456   °  ° Family Dynamics  ° Good Support System? Yes  ° Comments Ms Lazarz has good wupport from her sister-in-law who lives with Ms Cypress. Ms Yim has had much loss in her life this past year - her boyfriend of 18 years, father-in-law , a few other relatives, and her horse of 18 years, plus her dog of 18 years. She will benefit from meeting with our Health Counselor, Kathy Clayton and participation in [facility].  °  ° Barriers  ° Psychosocial barriers to participate in program The patient should benefit from training in stress management and relaxation.  °  ° Screening Interventions  ° Interventions Program counselor consult;Encouraged to exercise  °  ° ° °Quality of Life Scores: °  °  °Quality of Life - 06/11/16 1502   °  ° Quality of Life Scores  ° Health/Function Pre 20.63 %  ° Socioeconomic Pre 19.86 %  ° Psych/Spiritual Pre 20.57 %  ° Family Pre 20.2 %  ° GLOBAL Pre 20.4 %  °  ° ° °PHQ-9: °Recent Review Flowsheet Data   ° Depression screen PHQ 2/9 06/11/2016  ° Decreased Interest 2  ° Down, Depressed, Hopeless 1  ° PHQ - 2 Score 3  ° Altered sleeping 1  ° Tired, decreased energy 3  ° Change in appetite 0  ° Feeling bad or failure about yourself  1  ° Trouble concentrating 0  ° Moving slowly or fidgety/restless 0  ° Suicidal thoughts 0  ° PHQ-9 Score 8  ° Difficult doing work/chores Very difficult  °  ° °Interpretation of Total Score  °Total Score Depression Severity:  °1-4 = Minimal depression, 5-9 = Mild depression, 10-14 = Moderate depression, 15-19 = Moderately severe depression, 20-27 = Severe depression  ° °Psychosocial Evaluation and Intervention: °  °  °Psychosocial Evaluation - 08/07/16 1110   °  ° Psychosocial Evaluation & Interventions  ° Interventions Relaxation education;Stress management education;Encouraged  to exercise with the program and follow exercise prescription  ° Comments Counselor met with Ms. Lazare today for initial psychosocial evaluation.  She is a 55 yr old who has been diagnosed with COPD and is on Oxygen 24/7 now.  She has a significant other she has known most of her life who has been living with her for 8 months now.  Saleena reports a history of chronic sleeping problems with possibly 2/7 nights getting 6 hours   of sleep.  The remainder of the time it is typical for 3-4 hours of sleep.  She states her appetite is up and down lately.  She reports a history of depression subsequent to multiple losses over the past several years, including a fiance who committed suicide and a father figure in the same year; as well as her dog of 18 years and a horse she had for 18 years that she had to find a new home for recently.  Sherice states her sleep and mood issues have been worse since all of this occurred beginning in June of 2016.  She reports being on medications for depression and anxiety and states it is managing the symptoms "okay."  Her primary stressors are her health and her struggle to quit smoking.  Counselor suggested a graduated reduction plan of one less cigarette per day over a period of time to cut back on the reported pack and a half each day.  She agreed to give this a try.  She is on Chantix for smoking cessation, but reports this is not helpful.  Her goals for this program are to get healthier and to hopefully quit smoking after almost 47 years ("since the age of 8").  Counselor recommended a Grief Share group for support with the multiple significant losses over such a short period of time.  Counselor provided local group contact information with times.  Also, counselor mentioned Larene may need to speak with her Dr.  about a sleep study since her problems have been chronic and the medications prescribed are reportedly not being very effective.  Counselor and staff will be following with Vanessia  throughout the course of this program.  ° Continue Psychosocial Services  Yes  °Follow re: sleep cessation and graduated plan; as well as mood and sleep if not improved with consistent exercise.  Client was also given info on Grief support groups locally.  °  ° ° °Psychosocial Re-Evaluation: °  °  °Psychosocial Re-Evaluation   ° Row Name 08/09/16 1052  °  °  °  °  °  ° Psychosocial Re-Evaluation  ° Comments Ankita called to say she was sorry that she overslept today and she would not be able to attend the 11:30am Pulm Rehab class today either. Dejai reported that the first Pulmonary Rehab class helped her and she has cut back on her smoking because of that first Pulm Rehab session.       ° Interventions Encouraged to attend Pulmonary Rehabilitation for the exercise      °  ° ° °Psychosocial Discharge (Final Psychosocial Re-Evaluation): °  °  °Psychosocial Re-Evaluation - 08/09/16 1052   °  ° Psychosocial Re-Evaluation  ° Comments Porchea called to say she was sorry that she overslept today and she would not be able to attend the 11:30am Pulm Rehab class today either. Anadalay reported that the first Pulmonary Rehab class helped her and she has cut back on her smoking because of that first Pulm Rehab session.   ° Interventions Encouraged to attend Pulmonary Rehabilitation for the exercise  °  ° ° °Education: °Education Goals: Education classes will be provided on a weekly basis, covering required topics. Participant will state understanding/return demonstration of topics presented. ° °Learning Barriers/Preferences: °  °  °Learning Barriers/Preferences - 06/11/16 1445   °  ° Learning Barriers/Preferences  ° Learning Barriers None  ° Learning Preferences None  °  ° ° °Education Topics: °Initial Evaluation Education: °- Verbal, written and demonstration of   respiratory meds, RPE/PD scales, oximetry and breathing techniques. Instruction on use of nebulizers and MDIs: cleaning and proper use, rinsing mouth with steroid doses and  importance of monitoring MDI activations. Flowsheet Row Pulmonary Rehab from 08/07/2016 in [facility]  Date  06/11/16  Educator  LB  Instruction Review Code  2- meets goals/outcomes      General Nutrition Guidelines/Fats and Fiber: -Group instruction provided by verbal, written material, models and posters to present the general guidelines for heart healthy nutrition. Gives an explanation and review of dietary fats and fiber.   Controlling Sodium/Reading Food Labels: -Group verbal and written material supporting the discussion of sodium use in heart healthy nutrition. Review and explanation with models, verbal and written materials for utilization of the food label.   Exercise Physiology & Risk Factors: - Group verbal and written instruction with models to review the exercise physiology of the cardiovascular system and associated critical values. Details cardiovascular disease risk factors and the goals associated with each risk factor.   Aerobic Exercise & Resistance Training: - Gives group verbal and written discussion on the health impact of inactivity. On the components of aerobic and resistive training programs and the benefits of this training and how to safely progress through these programs.   Flexibility, Balance, General Exercise Guidelines: - Provides group verbal and written instruction on the benefits of flexibility and balance training programs. Provides general exercise guidelines with specific guidelines to those with heart or lung disease. Demonstration and skill practice provided.   Stress Management: - Provides group verbal and written instruction about the health risks of elevated stress, cause of high stress, and healthy ways to reduce stress.   Depression: - Provides group verbal and written instruction on the correlation between heart/lung disease and depressed mood, treatment options, and the stigmas associated with seeking  treatment.   Exercise & Equipment Safety: - Individual verbal instruction and demonstration of equipment use and safety with use of the equipment. Flowsheet Row Pulmonary Rehab from 08/07/2016 in [facility]  Date  08/07/16  Educator  [facility]  Instruction Review Code  2- meets goals/outcomes      Infection Prevention: - Provides verbal and written material to individual with discussion of infection control including proper hand washing and proper equipment cleaning during exercise session. Flowsheet Row Pulmonary Rehab from 08/07/2016 in [facility]  Date  08/07/16  Educator  [facility]  Instruction Review Code  2- meets goals/outcomes      Falls Prevention: - Provides verbal and written material to individual with discussion of falls prevention and safety. Flowsheet Row Pulmonary Rehab from 08/07/2016 in [facility] Pulmonary Rehab  Date  06/11/16  Educator  LB  Instruction Review Code  2- meets goals/outcomes      Diabetes: - Individual verbal and written instruction to review signs/symptoms of diabetes, desired ranges of glucose level fasting, after meals and with exercise. Advice that pre and post exercise glucose checks will be done for 3 sessions at entry of program.   Chronic Lung Diseases: - Group verbal and written instruction to review new updates, new respiratory medications, new advancements in procedures and treatments. Provide informative websites and "800" numbers of self-education.   Lung Procedures: - Group verbal and written instruction to describe testing methods done to diagnose lung disease. Review the outcome of test results. Describe the treatment choices: Pulmonary Function Tests, ABGs and oximetry.   Energy Conservation: - Provide group verbal and written instruction  for methods to conserve energy, plan and organize activities. Instruct on pacing techniques, use of adaptive equipment and posture/positioning to relieve  shortness of breath. ° ° °Triggers: °- Group verbal and written instruction to review types of environmental controls: home humidity, furnaces, filters, dust mite/pet prevention, HEPA vacuums. To discuss weather changes, air quality and the benefits of nasal washing. °Flowsheet Row Pulmonary Rehab from 08/07/2016 in [facility]  °Date  08/07/16  °Educator  LB  °Instruction Review Code  2- meets goals/outcomes  °  ° ° °Exacerbations: °- Group verbal and written instruction to provide: warning signs, infection symptoms, calling MD promptly, preventive modes, and value of vaccinations. Review: effective airway clearance, coughing and/or vibration techniques. Create an Action Plan. ° ° °Oxygen: °- Individual and group verbal and written instruction on oxygen therapy. Includes supplement oxygen, available portable oxygen systems, continuous and intermittent flow rates, oxygen safety, concentrators, and Medicare reimbursement for oxygen. °Flowsheet Row Pulmonary Rehab from 08/07/2016 in [facility]  °Date  06/11/16  °Educator  LB  °Instruction Review Code  2- meets goals/outcomes  °  ° ° °Respiratory Medications: °- Group verbal and written instruction to review medications for lung disease. Drug class, frequency, complications, importance of spacers, rinsing mouth after steroid MDI's, and proper cleaning methods for nebulizers. °Flowsheet Row Pulmonary Rehab from 08/07/2016 in [facility]  °Date  06/11/16  °Educator  LB  °Instruction Review Code  2- meets goals/outcomes  °  ° ° °AED/CPR: °- Group verbal and written instruction with the use of models to demonstrate the basic use of the AED with the basic ABC's of resuscitation. ° ° °Breathing Retraining: °- Provides individuals verbal and written instruction on purpose, frequency, and proper technique of diaphragmatic breathing and pursed-lipped breathing. Applies individual practice skills. °Flowsheet Row  Pulmonary Rehab from 08/07/2016 in [facility]  °Date  06/11/16  °Educator  LB  °Instruction Review Code  2- meets goals/outcomes  °  ° ° °Anatomy and Physiology of the Lungs: °- Group verbal and written instruction with the use of models to provide basic lung anatomy and physiology related to function, structure and complications of lung disease. ° ° °Heart Failure: °- Group verbal and written instruction on the basics of heart failure: signs/symptoms, treatments, explanation of ejection fraction, enlarged heart and cardiomyopathy. ° ° °Sleep Apnea: °- Individual verbal and written instruction to review Obstructive Sleep Apnea. Review of risk factors, methods for diagnosing and types of masks and machines for OSA. ° ° °Anxiety: °- Provides group, verbal and written instruction on the correlation between heart/lung disease and anxiety, treatment options, and management of anxiety. ° ° °Relaxation: °- Provides group, verbal and written instruction about the benefits of relaxation for patients with heart/lung disease. Also provides patients with examples of relaxation techniques. ° ° °Knowledge Questionnaire Score: °  °  °Knowledge Questionnaire Score - 06/11/16 1445   °  ° Knowledge Questionnaire Score  ° Pre Score 7/10  °  °  ° °Core Components/Risk Factors/Patient Goals at Admission: °  °  °Personal Goals and Risk Factors at Admission - 06/11/16 1452   °  ° Core Components/Risk Factors/Patient Goals on Admission  °  Weight Management Yes;Weight Maintenance;Weight Gain  ° Intervention Weight Management: Develop a combined nutrition and exercise program designed to reach desired caloric intake, while maintaining appropriate intake of nutrient and fiber, sodium and fats, and appropriate energy expenditure required for the weight goal.;Weight Management:   Provide education and appropriate resources to help participant work on and attain dietary goals.   Admit Weight 117 lb 9.6 oz (53.3 kg)   Goal  Weight: Short Term 120 lb (54.4 kg)   Goal Weight: Long Term 122 lb (55.3 kg)   Expected Outcomes Weight Gain: Understanding of general recommendations for a high calorie, high protein meal plan that promotes weight gain by distributing calorie intake throughout the day with the consumption for 4-5 meals, snacks, and/or supplements;Short Term: Continue to assess and modify interventions until short term weight is achieved;Long Term: Adherence to nutrition and physical activity/exercise program aimed toward attainment of established weight goal   Sedentary Yes   Intervention Provide advice, education, support and counseling about physical activity/exercise needs.;Develop an individualized exercise prescription for aerobic and resistive training based on initial evaluation findings, risk stratification, comorbidities and participant's personal goals.   Expected Outcomes Achievement of increased cardiorespiratory fitness and enhanced flexibility, muscular endurance and strength shown through measurements of functional capacity and personal statement of participant.   Increase Strength and Stamina Yes   Intervention Provide advice, education, support and counseling about physical activity/exercise needs.;Develop an individualized exercise prescription for aerobic and resistive training based on initial evaluation findings, risk stratification, comorbidities and participant's personal goals.   Expected Outcomes Achievement of increased cardiorespiratory fitness and enhanced flexibility, muscular endurance and strength shown through measurements of functional capacity and personal statement of participant.   Improve shortness of breath with ADL's Yes   Intervention Provide education, individualized exercise plan and daily activity instruction to help decrease symptoms of SOB with activities of daily living.   Expected Outcomes Short Term: Achieves a reduction of symptoms when performing activities of daily  living.   Develop more efficient breathing techniques such as purse lipped breathing and diaphragmatic breathing; and practicing self-pacing with activity Yes   Intervention Provide education, demonstration and support about specific breathing techniuqes utilized for more efficient breathing. Include techniques such as pursed lipped breathing, diaphragmatic breathing and self-pacing activity.   Expected Outcomes Short Term: Participant will be able to demonstrate and use breathing techniques as needed throughout daily activities.   Increase knowledge of respiratory medications and ability to use respiratory devices properly  Yes   Intervention Provide education and demonstration as needed of appropriate use of medications, inhalers, and oxygen therapy.  Ventolin, Symbicort, Spiriva MDI's; Duoneb; Oxygen 2l/m   Expected Outcomes Short Term: Achieves understanding of medications use. Understands that oxygen is a medication prescribed by physician. Demonstrates appropriate use of inhaler and oxygen therapy.      Core Components/Risk Factors/Patient Goals Review:      Goals and Risk Factor Review    Row Name 08/21/16 1552             Core Components/Risk Factors/Patient Goals Review   Personal Goals Review Develop more efficient breathing techniques such as purse lipped breathing and diaphragmatic breathing and practicing self-pacing with activity.       Review Ms Man has attended one session since her 06/11/16 evaluation. She attended 08/07/16 and accomplished her exercise goals on 2l/m oxygen. She was reminded to use PLB breathing with her exercise .  Currently she is driving her roommate to work in [facility], and it does affect her attendance. He roommate is getting her drivers license soon.        Expected Outcomes Continue to attend [facility] regularly          Core Components/Risk Factors/Patient Goals at Discharge (Final Review):  Goals and Risk Factor Review - 08/21/16  1552   °  ° Core Components/Risk Factors/Patient Goals Review  ° Personal Goals Review Develop more efficient breathing techniques such as purse lipped breathing and diaphragmatic breathing and practicing self-pacing with activity.  ° Review Ms Dimond has attended one session since her 06/11/16 evaluation. She attended 08/07/16 and accomplished her exercise goals on 2l/m oxygen. She was reminded to use PLB breathing with her exercise .  Currently she is driving her roommate to work in [place], and it does affect her attendance. He roommate is getting her drivers license soon.   ° Expected Outcomes Continue to attend [facility] regularly  °  ° ° °ITP Comments: °  °  °ITP Comments   ° Row Name 06/26/16 1535 07/09/16 0729 08/09/16 1051  °  °  ° ITP Comments Called Ms Kato who had an appointment to start [facility] on 06/24/16. She has been sick , has an appointment on 07/02/16 with her physician, and plans to start exercise in [facility] as soon as she recovers from her illness. Ms Brumley called to inform [facility] that she would probably return to class 07/15/16. She is recovering from her illness and drives her roommate to work which is somewhat of a conflict. Grant called to say she was sorry that she overslept today and she would not be able to attend the 11:30am Pulm Rehab class today either. Kyoko reported that the first Pulmonary Rehab class helped her and she has cut back on her smoking because of that first Pulm Rehab session.     °  ° ° °Comments: 30 Day Note Review ° °

## 2016-08-26 NOTE — Progress Notes (Signed)
Pulmonary Individual Treatment Plan ° °Patient Details  °Name: Christine Serrano °MRN: [id number] °Date of Birth: 07/30/1960 °Referring Provider:   °Flowsheet Row Pulmonary Rehab from 06/11/2016 in [facility]  °Referring Provider  Simonds, David MD  °  ° ° °Initial Encounter Date:  °Flowsheet Row Pulmonary Rehab from 06/11/2016 in [facility]  °Date  06/11/16  °Referring Provider  Simonds, David MD  °  ° ° °Visit Diagnosis: COPD, severe (HCC) ° °Patient's Home Medications on Admission: ° °Current Outpatient Prescriptions:  °•  AMITIZA 24 MCG capsule, , Disp: , Rfl:  °•  aspirin EC 81 MG tablet, Take 81 mg by mouth daily., Disp: , Rfl:  °•  Cholecalciferol (VITAMIN D) 2000 units tablet, Take 2,000 Units by mouth daily., Disp: , Rfl:  °•  cyproheptadine (PERIACTIN) 4 MG tablet, Take 4 mg by mouth 2 (two) times daily., Disp: , Rfl:  °•  diazepam (VALIUM) 5 MG tablet, Take 5 mg by mouth daily as needed for anxiety., Disp: , Rfl:  °•  gabapentin (NEURONTIN) 300 MG capsule, Take 300 mg by mouth 3 (three) times daily., Disp: , Rfl:  °•  ipratropium-albuterol (DUONEB) 0.5-2.5 (3) MG/3ML SOLN, , Disp: , Rfl:  °•  LINZESS 290 MCG CAPS capsule, Take 290 mcg by mouth daily., Disp: , Rfl:  °•  Multiple Vitamins-Minerals (MULTIVITAMIN ADULT) TABS, Take 1 tablet by mouth daily., Disp: , Rfl:  °•  SYMBICORT 160-4.5 MCG/ACT inhaler, Inhale 2 puffs into the lungs 2 (two) times daily., Disp: , Rfl:  °•  tiotropium (SPIRIVA) 18 MCG inhalation capsule, Place 1 capsule (18 mcg total) into inhaler and inhale daily., Disp: 30 capsule, Rfl: 11 °•  traZODone (DESYREL) 50 MG tablet, Take 100 mg by mouth at bedtime., Disp: , Rfl:  °•  varenicline (CHANTIX CONTINUING MONTH PAK) 1 MG tablet, Take 1 tablet (1 mg total) by mouth 2 (two) times daily., Disp: 60 tablet, Rfl: 2 °•  venlafaxine XR (EFFEXOR-XR) 150 MG 24 hr capsule, Take 150 mg by mouth daily with breakfast., Disp: , Rfl:  °•  VENTOLIN HFA 108 (90  Base) MCG/ACT inhaler, Inhale 2 puffs into the lungs every 6 (six) hours as needed for shortness of breath., Disp: , Rfl:  °•  vitamin B-12 (CYANOCOBALAMIN) 1000 MCG tablet, Take 1,000 mcg by mouth daily., Disp: , Rfl:  °•  vitamin E 400 UNIT capsule, Take 400 Units by mouth daily. , Disp: , Rfl:  ° °Past Medical History: °Past Medical History:  °Diagnosis Date  °• Chronic idiopathic constipation   °• COPD (chronic obstructive pulmonary disease) (HCC)   °• Depression   °• Endometriosis   ° ° °Tobacco Use: °History  °Smoking Status  °• Current Every Day Smoker  °• Packs/day: 1.00  °• Years: 47.00  °• Types: Cigarettes  °Smokeless Tobacco  °• Never Used  ° ° °Labs: °Recent Review Flowsheet Data   ° Labs for [facility] Cardiac and Pulmonary Rehab Latest Ref Rng & Units 04/04/2009 04/09/2016  ° PHART 7.350 - 7.450 7.302(L) 7.30(L)  ° PCO2ART 32.0 - 48.0 mmHg 59.5 CRITICAL RESULT CALLED TO, READ BACK BY AND VERIFIED WITH: WALKER,N.RN 04/04/09 AT 1440 CRITICAL RESULT CALLED TO, READ BACK BY AND VERIFIED WITH: WALKER,N.RN 04/04/09 AT 1440 BY BROADNAX,L.RRT(HH) 65(H)  ° HCO3 20.0 - 28.0 mmol/L 28.5(H) 32.0(H)  ° TCO2 0 - 100 mmol/L 25.9 -  ° O2SAT % 95.5 88.7  °  ° ° ° °ADL [facility]: °  °  °  Pulmonary Assessment Scores   ° Row Name 06/11/16 1447  °  °  °  ° ADL [facility]SD  ° ADL Phase Entry    ° SOB Score total 75    ° Rest 2    ° Walk 4    ° Stairs 5    ° Bath 3    ° Dress 3    ° Shop 5    °  ° ° °Pulmonary Function Assessment: °  °  °Pulmonary Function Assessment - 06/11/16 1445   °  ° Initial Spirometry Results  ° FVC% 31 %  ° FEV1% 27 %  ° FEV1/FVC Ratio 68.82  ° Comments Test Date 06/11/16  °  ° Post Bronchodilator Spirometry Results  ° FVC% 51 %  ° FEV1% 36 %  ° FEV1/FVC Ratio 59.71  °  ° Breath  ° Bilateral Breath Sounds Decreased;Expiratory;Wheezes  ° Shortness of Breath Yes;Limiting activity  °  ° ° °Exercise Target Goals: °  ° °Exercise Program Goal: °Individual exercise prescription set with THRR, safety & activity barriers.  Participant demonstrates ability to understand and report RPE using BORG scale, to self-measure pulse accurately, and to acknowledge the importance of the exercise prescription. ° °Exercise Prescription Goal: °Starting with aerobic activity 30 plus minutes a day, 3 days per week for initial exercise prescription. Provide home exercise prescription and guidelines that participant acknowledges understanding prior to discharge. ° °Activity Barriers & Risk Stratification: °  °  °Activity Barriers & Cardiac Risk Stratification - 06/11/16 1445   °  ° Activity Barriers & Cardiac Risk Stratification  ° Activity Barriers Shortness of Breath;Deconditioning;Muscular Weakness  ° Cardiac Risk Stratification Moderate  °  ° ° °6 Minute Walk: °  °  °6 Minute Walk   ° Row Name 06/11/16 1447  °  °  °  ° 6 Minute Walk  ° Phase Initial    ° Distance 920 feet    ° Walk Time 5.3 minutes    ° # of Rest Breaks 2  °14 sec, 28 sec    ° MPH 1.97    ° METS 3.75    ° RPE 15    ° Perceived Dyspnea  7    ° VO2 Peak 13.12    ° Symptoms Yes (comment)    ° Comments fatigue and SOB    ° Resting HR 118 bpm    ° Resting BP 126/56    ° Max Ex. HR 146 bpm    ° Max Ex. BP 128/64    ° 2 Minute Post BP 110/60    °  ° Interval HR  ° Baseline HR 118    ° 1 Minute HR 124    ° 2 Minute HR 139    ° 3 Minute HR 143    ° 4 Minute HR 136    ° 5 Minute HR 144    ° 6 Minute HR 146    ° 2 Minute Post HR 122    ° Interval Heart Rate? Yes    °  ° Interval Oxygen  ° Interval Oxygen? Yes    ° Baseline Oxygen Saturation % 94 %  °95% intermitten on her concentrater    ° Baseline Liters of Oxygen 2 L    ° 1 Minute Oxygen Saturation % 88 %  °stopped at 1:27 82%    ° 1 Minute Liters of Oxygen 2 L    ° 2 Minute Oxygen Saturation % 89 %    °   2 Minute Liters of Oxygen 2 L     3 Minute Oxygen Saturation % 90 %  stopped for rest at 3:13 86%     3 Minute Liters of Oxygen 2 L     4 Minute Oxygen Saturation % 86 %     4 Minute Liters of Oxygen 2 L     5 Minute Oxygen Saturation  % 88 %     5 Minute Liters of Oxygen 2 L     6 Minute Oxygen Saturation % 88 %     6 Minute Liters of Oxygen 2 L     2 Minute Post Oxygen Saturation % 92 %     2 Minute Post Liters of Oxygen 2 L       Oxygen Initial Assessment:     Oxygen Initial Assessment - 08/26/16 0831      Home Oxygen   Home Oxygen Device Portable Concentrator;Home Concentrator   Sleep Oxygen Prescription Continuous      Oxygen Re-Evaluation:   Oxygen Discharge (Final Oxygen Re-Evaluation):   Initial Exercise Prescription:     Initial Exercise Prescription - 06/11/16 1400      Date of Initial Exercise RX and Referring Provider   Date 06/11/16   Referring Provider Merton Border MD     Oxygen   Oxygen Continuous   Liters 3     Treadmill   MPH 1.5   Grade 0   Minutes 15  3 min x5   METs 2.15     NuStep   Level 2   SPM --  60-80 spm   Minutes 15   METs 2     REL-XR   Level 1   Watts --  speed 40-50   Minutes 15   METs 2     Prescription Details   Frequency (times per week) 3   Duration Progress to 45 minutes of aerobic exercise without signs/symptoms of physical distress     Intensity   THRR 40-80% of Max Heartrate 136-154   Ratings of Perceived Exertion 11-15   Perceived Dyspnea 0-4     Progression   Progression Continue to progress workloads to maintain intensity without signs/symptoms of physical distress.     Resistance Training   Training Prescription Yes   Weight 2 lbs   Reps 10-15      Perform Capillary Blood Glucose checks as needed.  Exercise Prescription Changes:     Exercise Prescription Changes    Row Name 06/11/16 1400 08/07/16 1200           Response to Exercise   Blood Pressure (Admit) 126/56 130/72      Blood Pressure (Exercise) 128/64 124/74      Blood Pressure (Exit) 110/60 126/72      Heart Rate (Admit) 118 bpm 123 bpm      Heart Rate (Exercise) 146 bpm 155 bpm      Heart Rate (Exit) 122 bpm 83 bpm      Oxygen Saturation (Admit) 94 %  93 %      Oxygen Saturation (Exercise) 82 % 90 %      Oxygen Saturation (Exit) 92 % 97 %      Rating of Perceived Exertion (Exercise) 15 11      Perceived Dyspnea (Exercise) 7 2      Symptoms fatigue and SOB  --      Duration  -- Progress to 45 minutes of aerobic exercise without signs/symptoms of physical distress  Intensity  -- THRR unchanged        Progression   Progression  -- Continue to progress workloads to maintain intensity without signs/symptoms of physical distress.        Resistance Training   Training Prescription  -- Yes      Weight  -- 2      Reps  -- 10-15        Interval Training   Interval Training  -- No        Oxygen   Oxygen  -- Continuous      Liters  -- 3        Bike   Level  -- 1  AD      Minutes  -- 8        NuStep   Level  -- 2      Minutes  -- 16      METs  -- 2.1        Exercise Review   Progression --  walk test results  --         Exercise Comments:     Exercise Comments    Row Name 06/11/16 1503 08/07/16 1229         Exercise Comments Svara wants to be able to go out and walk and to able to play with her dog. First full day of exercise!  Patient was oriented to gym and equipment including functions, settings, policies, and procedures.  Patient's individual exercise prescription and treatment plan were reviewed.  All starting workloads were established based on the results of the 6 minute walk test done at initial orientation visit.  The plan for exercise progression was also introduced and progression will be customized based on patient's performance and goals.         Exercise Goals and Review:   Exercise Goals Re-Evaluation :   Discharge Exercise Prescription (Final Exercise Prescription Changes):     Exercise Prescription Changes - 08/07/16 1200      Response to Exercise   Blood Pressure (Admit) 130/72   Blood Pressure (Exercise) 124/74   Blood Pressure (Exit) 126/72   Heart Rate (Admit) 123 bpm   Heart Rate  (Exercise) 155 bpm   Heart Rate (Exit) 83 bpm   Oxygen Saturation (Admit) 93 %   Oxygen Saturation (Exercise) 90 %   Oxygen Saturation (Exit) 97 %   Rating of Perceived Exertion (Exercise) 11   Perceived Dyspnea (Exercise) 2   Duration Progress to 45 minutes of aerobic exercise without signs/symptoms of physical distress   Intensity THRR unchanged     Progression   Progression Continue to progress workloads to maintain intensity without signs/symptoms of physical distress.     Resistance Training   Training Prescription Yes   Weight 2   Reps 10-15     Interval Training   Interval Training No     Oxygen   Oxygen Continuous   Liters 3     Bike   Level 1  AD   Minutes 8     NuStep   Level 2   Minutes 16   METs 2.1      Nutrition:  Target Goals: Understanding of nutrition guidelines, daily intake of sodium <1547m, cholesterol <207m calories 30% from fat and 7% or less from saturated fats, daily to have 5 or more servings of fruits and vegetables.  Biometrics:     Pre Biometrics - 06/11/16 1503      Pre Biometrics   Height  5' 4.9" (1.648 m)   Weight 117 lb 9.6 oz (53.3 kg)   Waist Circumference 32.5 inches   Hip Circumference 35 inches   Waist to Hip Ratio 0.93 %   BMI (Calculated) 19.7       Nutrition Therapy Plan and Nutrition Goals:   Nutrition Discharge: Rate Your Plate Scores:   Nutrition Goals Re-Evaluation:   Nutrition Goals Discharge (Final Nutrition Goals Re-Evaluation):   Psychosocial: Target Goals: Acknowledge presence or absence of significant depression and/or stress, maximize coping skills, provide positive support system. Participant is able to verbalize types and ability to use techniques and skills needed for reducing stress and depression.   Initial Review & Psychosocial Screening:     Initial Psych Review & Screening - 06/11/16 [place]? Yes   Comments Ms Bosak has good wupport  from her sister-in-law who lives with Ms Duca. Ms Crisp has had much loss in her life this past year - her boyfriend of 34 years, father-in-law , a few other relatives, and her horse of 18 years, plus her dog of 18 years. She will benefit from meeting with our Health Counselor, Lucianne Lei and participation in [facility].     Barriers   Psychosocial barriers to participate in program The patient should benefit from training in stress management and relaxation.     Screening Interventions   Interventions Program counselor consult;Encouraged to exercise      Quality of Life Scores:     Quality of Life - 06/11/16 1502      Quality of Life Scores   Health/Function Pre 20.63 %   Socioeconomic Pre 19.86 %   Psych/Spiritual Pre 20.57 %   Family Pre 20.2 %   GLOBAL Pre 20.4 %      PHQ-9: Recent Review Flowsheet Data    Depression screen [facility] 2/9 06/11/2016   Decreased Interest 2   Down, Depressed, Hopeless 1   PHQ - 2 Score 3   Altered sleeping 1   Tired, decreased energy 3   Change in appetite 0   Feeling bad or failure about yourself  1   Trouble concentrating 0   Moving slowly or fidgety/restless 0   Suicidal thoughts 0   PHQ-9 Score 8   Difficult doing work/chores Very difficult     Interpretation of Total Score  Total Score Depression Severity:  1-4 = Minimal depression, 5-9 = Mild depression, 10-14 = Moderate depression, 15-19 = Moderately severe depression, 20-27 = Severe depression   Psychosocial Evaluation and Intervention:     Psychosocial Evaluation - 08/07/16 1110      Psychosocial Evaluation & Interventions   Interventions Relaxation education;Stress management education;Encouraged to exercise with the program and follow exercise prescription   Comments Counselor met with Ms. Kirks today for initial psychosocial evaluation.  She is a 56 yr old who has been diagnosed with COPD and is on Oxygen 24/7 now.  She has a significant other she has known most  of her life who has been living with her for 8 months now.  Ajaya reports a history of chronic sleeping problems with possibly 2/7 nights getting 6 hours of sleep.  The remainder of the time it is typical for 3-4 hours of sleep.  She states her appetite is up and down lately.  She reports a history of depression subsequent to multiple losses over the past several years, including a fiance who committed suicide and a father figure in  the same year; as well as her dog of 18 years and a horse she had for 18 years that she had to find a new home for recently.  Pierce states her sleep and mood issues have been worse since all of this occurred beginning in June of 2016.  She reports being on medications for depression and anxiety and states it is managing the symptoms "okay."  Her primary stressors are her health and her struggle to quit smoking.  Counselor suggested a graduated reduction plan of one less cigarette per day over a period of time to cut back on the reported pack and a half each day.  She agreed to give this a try.  She is on Chantix for smoking cessation, but reports this is not helpful.  Her goals for this program are to get healthier and to hopefully quit smoking after almost 47 years ("since the age of 78").  Counselor recommended a Grief Share group for support with the multiple significant losses over such a short period of time.  Counselor provided local group contact information with times.  Also, counselor mentioned Kinesha may need to speak with her Dr.  about a sleep study since her problems have been chronic and the medications prescribed are reportedly not being very effective.  Counselor and staff will be following with Celese throughout the course of this program.   Continue Psychosocial Services  Yes  Follow re: sleep cessation and graduated plan; as well as mood and sleep if not improved with consistent exercise.  Client was also given info on Grief support groups locally.       Psychosocial Re-Evaluation:     Psychosocial Re-Evaluation    [place] Name 08/09/16 1052             Psychosocial Re-Evaluation   Comments Juley called to say she was sorry that she overslept today and she would not be able to attend the 11:30am Pulm Rehab class today either. Katelee reported that the first Pulmonary Rehab class helped her and she has cut back on her smoking because of that first [facility] Rehab session.        Interventions Encouraged to attend Pulmonary Rehabilitation for the exercise          Psychosocial Discharge (Final Psychosocial Re-Evaluation):     Psychosocial Re-Evaluation - 08/09/16 1052      Psychosocial Re-Evaluation   Comments Annett called to say she was sorry that she overslept today and she would not be able to attend the 11:30am Pulm Rehab class today either. Breyon reported that the first Pulmonary Rehab class helped her and she has cut back on her smoking because of that first [facility] Rehab session.    Interventions Encouraged to attend Pulmonary Rehabilitation for the exercise      Education: Education Goals: Education classes will be provided on a weekly basis, covering required topics. Participant will state understanding/return demonstration of topics presented.  Learning Barriers/Preferences:     Learning Barriers/Preferences - 06/11/16 1445      Learning Barriers/Preferences   Learning Barriers None   Learning Preferences None      Education Topics: Initial Evaluation Education: - Verbal, written and demonstration of respiratory meds, RPE/PD scales, oximetry and breathing techniques. Instruction on use of nebulizers and MDIs: cleaning and proper use, rinsing mouth with steroid doses and importance of monitoring MDI activations. Flowsheet Row Pulmonary Rehab from 08/07/2016 in [facility] Cardiac and Pulmonary Rehab  Date  06/11/16  Educator  LB  Instruction Review  Code  2- meets goals/outcomes      General Nutrition Guidelines/Fats and  Fiber: -Group instruction provided by verbal, written material, models and posters to present the general guidelines for heart healthy nutrition. Gives an explanation and review of dietary fats and fiber.   Controlling Sodium/Reading Food Labels: -Group verbal and written material supporting the discussion of sodium use in heart healthy nutrition. Review and explanation with models, verbal and written materials for utilization of the food label.   Exercise Physiology & Risk Factors: - Group verbal and written instruction with models to review the exercise physiology of the cardiovascular system and associated critical values. Details cardiovascular disease risk factors and the goals associated with each risk factor.   Aerobic Exercise & Resistance Training: - Gives group verbal and written discussion on the health impact of inactivity. On the components of aerobic and resistive training programs and the benefits of this training and how to safely progress through these programs.   Flexibility, Balance, General Exercise Guidelines: - Provides group verbal and written instruction on the benefits of flexibility and balance training programs. Provides general exercise guidelines with specific guidelines to those with heart or lung disease. Demonstration and skill practice provided.   Stress Management: - Provides group verbal and written instruction about the health risks of elevated stress, cause of high stress, and healthy ways to reduce stress.   Depression: - Provides group verbal and written instruction on the correlation between heart/lung disease and depressed mood, treatment options, and the stigmas associated with seeking treatment.   Exercise & Equipment Safety: - Individual verbal instruction and demonstration of equipment use and safety with use of the equipment. Flowsheet Row Pulmonary Rehab from 08/07/2016 in [facility] Cardiac and Pulmonary Rehab  Date  08/07/16  Educator  [facility]   Instruction Review Code  2- meets goals/outcomes      Infection Prevention: - Provides verbal and written material to individual with discussion of infection control including proper hand washing and proper equipment cleaning during exercise session. Flowsheet Row Pulmonary Rehab from 08/07/2016 in [facility] Cardiac and Pulmonary Rehab  Date  08/07/16  Educator  [facility]  Instruction Review Code  2- meets goals/outcomes      Falls Prevention: - Provides verbal and written material to individual with discussion of falls prevention and safety. Flowsheet Row Pulmonary Rehab from 08/07/2016 in [facility] Cardiac and Pulmonary Rehab  Date  06/11/16  Educator  LB  Instruction Review Code  2- meets goals/outcomes      Diabetes: - Individual verbal and written instruction to review signs/symptoms of diabetes, desired ranges of glucose level fasting, after meals and with exercise. Advice that pre and post exercise glucose checks will be done for 3 sessions at entry of program.   Chronic Lung Diseases: - Group verbal and written instruction to review new updates, new respiratory medications, new advancements in procedures and treatments. Provide informative websites and "800" numbers of self-education.   Lung Procedures: - Group verbal and written instruction to describe testing methods done to diagnose lung disease. Review the outcome of test results. Describe the treatment choices: Pulmonary Function Tests, ABGs and oximetry.   Energy Conservation: - Provide group verbal and written instruction for methods to conserve energy, plan and organize activities. Instruct on pacing techniques, use of adaptive equipment and posture/positioning to relieve shortness of breath.   Triggers: - Group verbal and written instruction to review types of environmental controls: home humidity, furnaces, filters, dust mite/pet prevention, HEPA vacuums. To discuss weather changes, air  quality and the benefits of nasal  washing. Flowsheet Row Pulmonary Rehab from 08/07/2016 in [facility] Cardiac and Pulmonary Rehab  Date  08/07/16  Educator  LB  Instruction Review Code  2- meets goals/outcomes      Exacerbations: - Group verbal and written instruction to provide: warning signs, infection symptoms, calling MD promptly, preventive modes, and value of vaccinations. Review: effective airway clearance, coughing and/or vibration techniques. Create an Sports administrator.   Oxygen: - Individual and group verbal and written instruction on oxygen therapy. Includes supplement oxygen, available portable oxygen systems, continuous and intermittent flow rates, oxygen safety, concentrators, and Medicare reimbursement for oxygen. Flowsheet Row Pulmonary Rehab from 08/07/2016 in [facility] Cardiac and Pulmonary Rehab  Date  06/11/16  Educator  LB  Instruction Review Code  2- meets goals/outcomes      Respiratory Medications: - Group verbal and written instruction to review medications for lung disease. Drug class, frequency, complications, importance of spacers, rinsing mouth after steroid MDI's, and proper cleaning methods for nebulizers. Flowsheet Row Pulmonary Rehab from 08/07/2016 in [facility] Cardiac and Pulmonary Rehab  Date  06/11/16  Educator  LB  Instruction Review Code  2- meets goals/outcomes      AED/CPR: - Group verbal and written instruction with the use of models to demonstrate the basic use of the AED with the basic ABC's of resuscitation.   Breathing Retraining: - Provides individuals verbal and written instruction on purpose, frequency, and proper technique of diaphragmatic breathing and pursed-lipped breathing. Applies individual practice skills. Flowsheet Row Pulmonary Rehab from 08/07/2016 in [facility] Cardiac and Pulmonary Rehab  Date  06/11/16  Educator  LB  Instruction Review Code  2- meets goals/outcomes      Anatomy and Physiology of the Lungs: - Group verbal and written instruction with the use of models to  provide basic lung anatomy and physiology related to function, structure and complications of lung disease.   Heart Failure: - Group verbal and written instruction on the basics of heart failure: signs/symptoms, treatments, explanation of ejection fraction, enlarged heart and cardiomyopathy.   Sleep Apnea: - Individual verbal and written instruction to review Obstructive Sleep Apnea. Review of risk factors, methods for diagnosing and types of masks and machines for OSA.   Anxiety: - Provides group, verbal and written instruction on the correlation between heart/lung disease and anxiety, treatment options, and management of anxiety.   Relaxation: - Provides group, verbal and written instruction about the benefits of relaxation for patients with heart/lung disease. Also provides patients with examples of relaxation techniques.   Knowledge Questionnaire Score:     Knowledge Questionnaire Score - 06/11/16 1445      Knowledge Questionnaire Score   Pre Score 7/10       Core Components/Risk Factors/Patient Goals at Admission:     Personal Goals and Risk Factors at Admission - 06/11/16 1452      Core Components/Risk Factors/Patient Goals on Admission    Weight Management Yes;Weight Maintenance;Weight Gain   Intervention Weight Management: Develop a combined nutrition and exercise program designed to reach desired caloric intake, while maintaining appropriate intake of nutrient and fiber, sodium and fats, and appropriate energy expenditure required for the weight goal.;Weight Management: Provide education and appropriate resources to help participant work on and attain dietary goals.   Admit Weight 117 lb 9.6 oz (53.3 kg)   Goal Weight: Short Term 120 lb (54.4 kg)   Goal Weight: Long Term 122 lb (55.3 kg)   Expected Outcomes Weight Gain: Understanding of general  recommendations for a high calorie, high protein meal plan that promotes weight gain by distributing calorie intake  throughout the day with the consumption for 4-5 meals, snacks, and/or supplements;Short Term: Continue to assess and modify interventions until short term weight is achieved;Long Term: Adherence to nutrition and physical activity/exercise program aimed toward attainment of established weight goal   Sedentary Yes   Intervention Provide advice, education, support and counseling about physical activity/exercise needs.;Develop an individualized exercise prescription for aerobic and resistive training based on initial evaluation findings, risk stratification, comorbidities and participant's personal goals.   Expected Outcomes Achievement of increased cardiorespiratory fitness and enhanced flexibility, muscular endurance and strength shown through measurements of functional capacity and personal statement of participant.   Increase Strength and Stamina Yes   Intervention Provide advice, education, support and counseling about physical activity/exercise needs.;Develop an individualized exercise prescription for aerobic and resistive training based on initial evaluation findings, risk stratification, comorbidities and participant's personal goals.   Expected Outcomes Achievement of increased cardiorespiratory fitness and enhanced flexibility, muscular endurance and strength shown through measurements of functional capacity and personal statement of participant.   Improve shortness of breath with ADL's Yes   Intervention Provide education, individualized exercise plan and daily activity instruction to help decrease symptoms of SOB with activities of daily living.   Expected Outcomes Short Term: Achieves a reduction of symptoms when performing activities of daily living.   Develop more efficient breathing techniques such as purse lipped breathing and diaphragmatic breathing; and practicing self-pacing with activity Yes   Intervention Provide education, demonstration and support about specific breathing techniuqes  utilized for more efficient breathing. Include techniques such as pursed lipped breathing, diaphragmatic breathing and self-pacing activity.   Expected Outcomes Short Term: Participant will be able to demonstrate and use breathing techniques as needed throughout daily activities.   Increase knowledge of respiratory medications and ability to use respiratory devices properly  Yes   Intervention Provide education and demonstration as needed of appropriate use of medications, inhalers, and oxygen therapy.  Ventolin, Symbicort, Spiriva MDI's; Duoneb; Oxygen 2l/m   Expected Outcomes Short Term: Achieves understanding of medications use. Understands that oxygen is a medication prescribed by physician. Demonstrates appropriate use of inhaler and oxygen therapy.      Core Components/Risk Factors/Patient Goals Review:      Goals and Risk Factor Review    Row Name 08/21/16 1552             Core Components/Risk Factors/Patient Goals Review   Personal Goals Review Develop more efficient breathing techniques such as purse lipped breathing and diaphragmatic breathing and practicing self-pacing with activity.       Review Ms Newby has attended one session since her 06/11/16 evaluation. She attended 08/07/16 and accomplished her exercise goals on 2l/m oxygen. She was reminded to use PLB breathing with her exercise .  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 He roommate is getting her drivers license soon.        Expected Outcomes Continue to attend [facility] regularly          Core Components/Risk Factors/Patient Goals at Discharge (Final Review):      Goals and Risk Factor Review - 08/21/16 1552      Core Components/Risk Factors/Patient Goals Review   Personal Goals Review Develop more efficient breathing techniques such as purse lipped breathing and diaphragmatic breathing and practicing self-pacing with activity.   Review Ms Jaskolski has  attended one  session since her 06/11/16 evaluation. She attended 08/07/16 and accomplished her exercise goals on 2l/m oxygen. She was reminded to use PLB breathing with her exercise .  Currently she is driving her roommate to work in [facility], and it does affect her attendance. He roommate is getting her drivers license soon.    Expected Outcomes Continue to attend [facility] regularly      ITP Comments:     ITP Comments    Row Name 06/26/16 1535 07/09/16 0729 08/09/16 1051       ITP Comments Called Ms Mcqueary who had an appointment to start [place] on 06/24/16. She has been sick , has an appointment on 07/02/16 with her physician, and plans to start exercise in [place] as soon as she recovers from her illness. Ms Orne called to inform [facility] that she would probably return to class 07/15/16. She is recovering from her illness and drives her roommate to work which is somewhat of a conflict. Teylor called to say she was sorry that she overslept today and she would not be able to attend the 11:30am Pulm Rehab class today either. Kennya reported that the first Pulmonary Rehab class helped her and she has cut back on her smoking because of that first [facility] session.        30 Day Note Review  Comments:

## 2016-08-26 NOTE — Telephone Encounter (Signed)
I have only seen her once. She never returned for her follow up appt. As I remember, she was a nice lady, not sure why she wants to switch, but its fine with me.

## 2016-08-27 NOTE — Telephone Encounter (Signed)
Ok to switch 

## 2016-08-29 ENCOUNTER — Telehealth: Payer: Self-pay

## 2016-08-29 NOTE — Telephone Encounter (Signed)
LMOM

## 2016-08-30 NOTE — Telephone Encounter (Signed)
Pt scheduled for 04/05 Pt aware

## 2016-09-06 ENCOUNTER — Telehealth: Payer: Self-pay | Admitting: Pulmonary Disease

## 2016-09-06 NOTE — Telephone Encounter (Signed)
Please advise on the instructions for the Duoneb. Thanks.

## 2016-09-06 NOTE — Telephone Encounter (Signed)
Pharmacy calling having a question on patients ipratropium-albuterol They are calling to let us know on that prescription we sent in  All we had was the name of it. They need to know how much does she take and for how long and how often, along with any refills that may be allowed Please advise.

## 2016-09-09 MED ORDER — IPRATROPIUM-ALBUTEROL 0.5-2.5 (3) MG/3ML IN SOLN: 3.0000 mL | Freq: Four times a day (QID) | RESPIRATORY_TRACT | 3 refills | Status: DC | PRN

## 2016-09-09 NOTE — Addendum Note (Signed)
Addended by: Oscar La R on: 09/09/2016 01:50 PM   Modules accepted: Orders

## 2016-09-09 NOTE — Telephone Encounter (Signed)
Every 6 hrs as needed for SOB, wheezing  Christine Serrano

## 2016-09-09 NOTE — Telephone Encounter (Signed)
New RX for [employer] sent to [facility]. Nothing further needed.

## 2016-09-10 ENCOUNTER — Encounter: Payer: Self-pay | Admitting: Respiratory Therapy

## 2016-09-10 ENCOUNTER — Telehealth: Payer: Self-pay | Admitting: Respiratory Therapy

## 2016-09-10 DIAGNOSIS — J449 Chronic obstructive pulmonary disease, unspecified: Secondary | ICD-10-CM

## 2016-09-10 NOTE — Telephone Encounter (Signed)
Called Ms Casco - last attended 08/07/16; first visit was 06/11/16. Left a message to inquire if Ms Caridi plans to continue in Day.

## 2016-09-16 ENCOUNTER — Encounter: Payer: Commercial Managed Care - HMO | Attending: Pulmonary Disease

## 2016-09-16 DIAGNOSIS — J449 Chronic obstructive pulmonary disease, unspecified: Secondary | ICD-10-CM | POA: Insufficient documentation

## 2016-09-17 ENCOUNTER — Ambulatory Visit (INDEPENDENT_AMBULATORY_CARE_PROVIDER_SITE_OTHER): Payer: Medicare HMO | Admitting: Pulmonary Disease

## 2016-09-17 ENCOUNTER — Ambulatory Visit
Admission: RE | Admit: 2016-09-17 | Discharge: 2016-09-17 | Disposition: A | Discharge status: Home / Self Care | Payer: Medicare HMO | Source: Ambulatory Visit | Attending: Pulmonary Disease | Admitting: Pulmonary Disease

## 2016-09-17 ENCOUNTER — Encounter: Payer: Self-pay | Admitting: Pulmonary Disease

## 2016-09-17 VITALS — BP 118/66 | HR 120 | Ht 62.0 in | Wt 125.0 lb

## 2016-09-17 DIAGNOSIS — F17200 Nicotine dependence, unspecified, uncomplicated: Secondary | ICD-10-CM

## 2016-09-17 DIAGNOSIS — J441 Chronic obstructive pulmonary disease with (acute) exacerbation: Secondary | ICD-10-CM

## 2016-09-17 DIAGNOSIS — R0602 Shortness of breath: Secondary | ICD-10-CM | POA: Diagnosis not present

## 2016-09-17 DIAGNOSIS — J432 Centrilobular emphysema: Secondary | ICD-10-CM | POA: Diagnosis not present

## 2016-09-17 DIAGNOSIS — J9611 Chronic respiratory failure with hypoxia: Secondary | ICD-10-CM | POA: Diagnosis not present

## 2016-09-17 DIAGNOSIS — J42 Unspecified chronic bronchitis: Secondary | ICD-10-CM | POA: Diagnosis not present

## 2016-09-17 MED ORDER — VARENICLINE TARTRATE 1 MG PO TABS: 1.0000 mg | ORAL_TABLET | Freq: Two times a day (BID) | ORAL | 5 refills | Status: DC

## 2016-09-17 MED ORDER — SYMBICORT 160-4.5 MCG/ACT IN AERO: 2.0000 | INHALATION_SPRAY | Freq: Two times a day (BID) | RESPIRATORY_TRACT | 4 refills | Status: DC

## 2016-09-17 MED ORDER — VENTOLIN HFA 108 (90 BASE) MCG/ACT IN AERS: 2.0000 | INHALATION_SPRAY | Freq: Four times a day (QID) | RESPIRATORY_TRACT | 4 refills | Status: DC | PRN

## 2016-09-17 MED ORDER — VARENICLINE TARTRATE 0.5 MG X 11 & 1 MG X 42 PO MISC: ORAL | 0 refills | Status: DC

## 2016-09-17 MED ORDER — TIOTROPIUM BROMIDE MONOHYDRATE 18 MCG IN CAPS: 18.0000 ug | ORAL_CAPSULE | Freq: Every day | RESPIRATORY_TRACT | 4 refills | Status: DC

## 2016-09-17 MED ORDER — DOXYCYCLINE MONOHYDRATE 100 MG PO CAPS: 100.0000 mg | ORAL_CAPSULE | Freq: Two times a day (BID) | ORAL | 0 refills | Status: AC

## 2016-09-17 NOTE — Progress Notes (Signed)
PULMONARY OFFICE FOLLOW UP  PT PROFILE: 37 F recalcitrant smoker with severe, oxygen-dependent COPD hospitalized 10/24-10/26/17 for COPD exacerbation initially requiring NIPPV  PROBLEMS: Recalcitrant smoker Chronic hypoxic respiratory failure Severe emphysema Chronic bronchitis  DATA: CT chest 03/13/16: Severe emphysema with extensive disease in the upper lobes and superior segment of the right lower lobe. This could represent acute and/or chronic changes. In addition, there is an irregular spiculated density along the right minor fissure which is indeterminate.  SUBJ: Last seen by me in November 2017. No major events since that visit. Unfortunately, she is now smoking up to 3 packs of cigarettes per day. She is essentially housebound and "has nothing else to do". She has baseline severe exertional dyspnea. She has chronic cough with chronic mucus production. She notes recently increase in both cough and mucus production. She does not exert herself enough to discern whether her shortness of breath is worse than baseline.. She denies fever, chest pain, hemoptysis, lower extremity edema, calf tenderness.  OBJ: Vitals:   09/17/16 1129  BP: 118/66  Pulse: (!) 120  SpO2: 91%  Weight: 125 lb (56.7 kg)  Height: 5\' 2"  (1.575 m)  2.5 LPM [place]  Gen: Flat affect, no overt distress HEENT: No acute finding Neck: No LAN or JVD noted Lungs: decreased BS throughout, diffuse wheezes Cardiovascular: Reg, no M noted Abdomen: Soft, NT +BS Ext: no C/C/E Neuro: grossly intact Skin: No lesions noted  CXR (09/17/16): Severe hyperinflation with no acute findings  IMPRESSION: Severe COPD/emphysema/chronic bronchitis Chronic hypoxic respiratory failure Recalcitrant smoker Mild COPD exacerbation with increased mucus and wheezing  PLAN: 1) Smoking cessation was again discussed in detail including the risks associated with continued smoking, the benefits of smoking cessation and strategies that might  be helpful. I made it very clear to her that there is really nothing that I can do for her in the face of ongoing smoking. After thorough consideration, she has decided to again try Chantix. She has also been referred to the smoking cessation program 2) Continue Symbicort, Spiriva, PRN albuterol, oxygen therapy 3) doxycycline 100 mg twice a day 7 days 4) ROV 3-4 months   Merton Border, MD PCCM service Mobile [phone] Pager [phone] 09/17/2016

## 2016-09-17 NOTE — Patient Instructions (Addendum)
Chest x-ray today  Doxycycline 100 mg twice a day for 7 days  Resume Chantix-starter pack and continuation pack ordered  Smoking cessation as we discussed. Christine Serrano has provided you information regarding our smoking cessation classes  Resume pulmonary rehabilitation as planned  Continue Symbicort, Spiriva, albuterol and oxygen as previously  I have sent refills for all of the above medications as discussed line  Follow-up in 3-4 months

## 2016-09-19 ENCOUNTER — Ambulatory Visit: Payer: Medicare HMO | Admitting: Family Medicine

## 2016-09-19 DIAGNOSIS — J449 Chronic obstructive pulmonary disease, unspecified: Secondary | ICD-10-CM | POA: Diagnosis not present

## 2016-09-20 ENCOUNTER — Telehealth: Payer: Self-pay | Admitting: Family Medicine

## 2016-09-20 NOTE — Telephone Encounter (Signed)
Patient called.  She missed her appointment with Dr.Bedsole yesterday.  Patient said she lost her keys.  She tried to call the office, but she was told the phone system wasn't working. Patient rescheduled her appointment to 10/01/16.

## 2016-09-20 NOTE — Telephone Encounter (Signed)
Noted  

## 2016-09-23 ENCOUNTER — Encounter: Payer: Self-pay | Admitting: Respiratory Therapy

## 2016-09-23 DIAGNOSIS — J449 Chronic obstructive pulmonary disease, unspecified: Secondary | ICD-10-CM

## 2016-09-23 NOTE — Progress Notes (Signed)
Pulmonary Individual Treatment Plan  Patient Details  Serrano: Christine Serrano MRN: [id number] Date of Birth: Apr 16, 1961 Referring Provider:     Pulmonary Rehab from 06/11/2016 in [facility] Cardiac and Pulmonary Rehab  Referring Provider  Merton Border MD      Initial Encounter Date:    Pulmonary Rehab from 06/11/2016 in [facility] Cardiac and Pulmonary Rehab  Date  06/11/16  Referring Provider  Merton Border MD      Visit Diagnosis: COPD, severe ([place])  Patient's Home Medications on Admission:  Current Outpatient Prescriptions:    AMITIZA 24 MCG capsule, , Disp: , Rfl:    aspirin EC 81 MG tablet, Take 81 mg by mouth daily., Disp: , Rfl:    Cholecalciferol (VITAMIN D) 2000 units tablet, Take 2,000 Units by mouth daily., Disp: , Rfl:    cyproheptadine (PERIACTIN) 4 MG tablet, Take 4 mg by mouth 2 (two) times daily., Disp: , Rfl:    diazepam (VALIUM) 5 MG tablet, Take 5 mg by mouth daily as needed for anxiety., Disp: , Rfl:    doxycycline (MONODOX) 100 MG capsule, Take 1 capsule (100 mg total) by mouth 2 (two) times daily., Disp: 14 capsule, Rfl: 0   gabapentin (NEURONTIN) 300 MG capsule, Take 300 mg by mouth 3 (three) times daily., Disp: , Rfl:    ipratropium-albuterol (DUONEB) 0.5-2.5 (3) MG/3ML SOLN, Inhale 3 mLs into the lungs every 6 (six) hours as needed., Disp: 360 mL, Rfl: 3   LINZESS 290 MCG CAPS capsule, Take 290 mcg by mouth daily., Disp: , Rfl:    Multiple Vitamins-Minerals (MULTIVITAMIN ADULT) TABS, Take 1 tablet by mouth daily., Disp: , Rfl:    SYMBICORT 160-4.5 MCG/ACT inhaler, Inhale 2 puffs into the lungs 2 (two) times daily., Disp: 3 Inhaler, Rfl: 4   tiotropium (SPIRIVA) 18 MCG inhalation capsule, Place 1 capsule (18 mcg total) into inhaler and inhale daily., Disp: 90 capsule, Rfl: 4   traZODone (DESYREL) 50 MG tablet, Take 100 mg by mouth at bedtime., Disp: , Rfl:    varenicline (CHANTIX CONTINUING MONTH PAK) 1 MG tablet, Take 1 tablet (1 mg total) by mouth 2  (two) times daily., Disp: 60 tablet, Rfl: 5   varenicline (CHANTIX STARTING MONTH PAK) 0.5 MG X 11 & 1 MG X 42 tablet, Take one 0.5 mg tablet by mouth once daily for 3 days, then increase to one 0.5 mg tablet twice daily for 4 days, then increase to one 1 mg tablet twice daily., Disp: 53 tablet, Rfl: 0   venlafaxine XR (EFFEXOR-XR) 150 MG 24 hr capsule, Take 150 mg by mouth daily with breakfast., Disp: , Rfl:    VENTOLIN HFA 108 (90 Base) MCG/ACT inhaler, Inhale 2 puffs into the lungs every 6 (six) hours as needed for shortness of breath., Disp: 6 Inhaler, Rfl: 4   vitamin B-12 (CYANOCOBALAMIN) 1000 MCG tablet, Take 1,000 mcg by mouth daily., Disp: , Rfl:    vitamin E 400 UNIT capsule, Take 400 Units by mouth daily. , Disp: , Rfl:   Past Medical History: Past Medical History:  Diagnosis Date   Chronic idiopathic constipation    COPD (chronic obstructive pulmonary disease) (HCC)    Depression    Endometriosis     Tobacco Use: History  Smoking Status   Current Every Day Smoker   Packs/day: 3.00   Years: 47.00   Types: Cigarettes  Smokeless Tobacco   Never Used    Labs: Recent Review [facility] Cardiac  and Pulmonary Rehab Latest Ref Rng & Units 04/04/2009 04/09/2016   PHART 7.350 - 7.450 7.302(L) 7.30(L)   PCO2ART 32.0 - 48.0 mmHg 59.5 CRITICAL RESULT CALLED TO, READ BACK BY AND VERIFIED WITH: [facility] 04/04/09 AT 1440 CRITICAL RESULT CALLED TO, READ BACK BY AND VERIFIED WITH: [facility] 04/04/09 AT 1440 BY BROADNAX,L.RRT(HH) 65(H)   HCO3 20.0 - 28.0 mmol/L 28.5(H) 32.0(H)   TCO2 0 - 100 mmol/L 25.9 -   O2SAT % 95.5 88.7       ADL UCSD:     Pulmonary Assessment Scores    Row Serrano 06/11/16 1447         ADL UCSD   ADL Phase Entry     SOB Score total 75     Rest 2     Walk 4     Stairs 5     Bath 3     Dress 3     Shop 5        Pulmonary Function Assessment:     Pulmonary Function Assessment - 06/11/16 1445      Initial  Spirometry Results   FVC% 31 %   FEV1% 27 %   FEV1/FVC Ratio 68.82   Comments Test Date 06/11/16     Post Bronchodilator Spirometry Results   FVC% 51 %   FEV1% 36 %   FEV1/FVC Ratio 59.71     Breath   Bilateral Breath Sounds Decreased;Expiratory;Wheezes   Shortness of Breath Yes;Limiting activity      Exercise Target Goals:    Exercise Program Goal: Individual exercise prescription set with THRR, safety & activity barriers. Participant demonstrates ability to understand and report RPE using BORG scale, to self-measure pulse accurately, and to acknowledge the importance of the exercise prescription.  Exercise Prescription Goal: Starting with aerobic activity 30 plus minutes a day, 3 days per week for initial exercise prescription. Provide home exercise prescription and guidelines that participant acknowledges understanding prior to discharge.  Activity Barriers & Risk Stratification:     Activity Barriers & Cardiac Risk Stratification - 06/11/16 1445      Activity Barriers & Cardiac Risk Stratification   Activity Barriers Shortness of Breath;Deconditioning;Muscular Weakness   Cardiac Risk Stratification Moderate      6 Minute Walk:     6 Minute Walk    Row Serrano 06/11/16 1447         6 Minute Walk   Phase Initial     Distance 920 feet     Walk Time 5.3 minutes     # of Rest Breaks 2  14 sec, 28 sec     MPH 1.97     METS 3.75     RPE 15     Perceived Dyspnea  7     VO2 Peak 13.12     Symptoms Yes (comment)     Comments fatigue and SOB     Resting HR 118 bpm     Resting BP 126/56     Max Ex. HR 146 bpm     Max Ex. BP 128/64     2 Minute Post BP 110/60       Interval HR   Baseline HR 118     1 Minute HR 124     2 Minute HR 139     3 Minute HR 143     4 Minute HR 136     5 Minute HR 144     6 Minute HR 146     2  Minute Post HR 122     Interval Heart Rate? Yes       Interval Oxygen   Interval Oxygen? Yes     Baseline Oxygen Saturation % 94 %  95%  intermitten on her concentrater     Baseline Liters of Oxygen 2 L     1 Minute Oxygen Saturation % 88 %  stopped at 1:27 82%     1 Minute Liters of Oxygen 2 L     2 Minute Oxygen Saturation % 89 %     2 Minute Liters of Oxygen 2 L     3 Minute Oxygen Saturation % 90 %  stopped for rest at 3:13 86%     3 Minute Liters of Oxygen 2 L     4 Minute Oxygen Saturation % 86 %     4 Minute Liters of Oxygen 2 L     5 Minute Oxygen Saturation % 88 %     5 Minute Liters of Oxygen 2 L     6 Minute Oxygen Saturation % 88 %     6 Minute Liters of Oxygen 2 L     2 Minute Post Oxygen Saturation % 92 %     2 Minute Post Liters of Oxygen 2 L       Oxygen Initial Assessment:     Oxygen Initial Assessment - 08/26/16 0834      Home Oxygen   Home Oxygen Device Portable Concentrator;Home Concentrator   Sleep Oxygen Prescription Continuous   Liters per minute 2   Home Exercise Oxygen Prescription Continuous   Liters per minute 2   Home at Rest Exercise Oxygen Prescription Continuous   Liters per minute 2     Program Oxygen Prescription   Program Oxygen Prescription Continuous;E-Tanks   Liters per minute 2      Oxygen Re-Evaluation:   Oxygen Discharge (Final Oxygen Re-Evaluation):   Initial Exercise Prescription:     Initial Exercise Prescription - 06/11/16 1400      Date of Initial Exercise RX and Referring Provider   Date 06/11/16   Referring Provider Merton Border MD     Oxygen   Oxygen Continuous   Liters 3     Treadmill   MPH 1.5   Grade 0   Minutes 15  3 min x5   METs 2.15     NuStep   Level 2   SPM --  60-80 spm   Minutes 15   METs 2     REL-XR   Level 1   Watts --  speed 40-50   Minutes 15   METs 2     Prescription Details   Frequency (times per week) 3   Duration Progress to 45 minutes of aerobic exercise without signs/symptoms of physical distress     Intensity   THRR 40-80% of Max Heartrate 136-154   Ratings of Perceived Exertion 11-15    Perceived Dyspnea 0-4     Progression   Progression Continue to progress workloads to maintain intensity without signs/symptoms of physical distress.     Resistance Training   Training Prescription Yes   Weight 2 lbs   Reps 10-15      Perform Capillary Blood Glucose checks as needed.  Exercise Prescription Changes:     Exercise Prescription Changes    Row Serrano 06/11/16 1400 08/07/16 1200           Response to Exercise   Blood Pressure (Admit) 126/56 130/72  Blood Pressure (Exercise) 128/64 124/74      Blood Pressure (Exit) 110/60 126/72      Heart Rate (Admit) 118 bpm 123 bpm      Heart Rate (Exercise) 146 bpm 155 bpm      Heart Rate (Exit) 122 bpm 83 bpm      Oxygen Saturation (Admit) 94 % 93 %      Oxygen Saturation (Exercise) 82 % 90 %      Oxygen Saturation (Exit) 92 % 97 %      Rating of Perceived Exertion (Exercise) 15 11      Perceived Dyspnea (Exercise) 7 2      Symptoms fatigue and SOB  --      Duration  -- Progress to 45 minutes of aerobic exercise without signs/symptoms of physical distress      Intensity  -- THRR unchanged        Progression   Progression  -- Continue to progress workloads to maintain intensity without signs/symptoms of physical distress.        Resistance Training   Training Prescription  -- Yes      Weight  -- 2      Reps  -- 10-15        Interval Training   Interval Training  -- No        Oxygen   Oxygen  -- Continuous      Liters  -- 3        Bike   Level  -- 1  AD      Minutes  -- 8        NuStep   Level  -- 2      Minutes  -- 16      METs  -- 2.1        Exercise Review   Progression --  walk test results  --         Exercise Comments:     Exercise Comments    Row Serrano 06/11/16 1503 08/07/16 1229         Exercise Comments Christine Serrano wants to be able to go out and walk and to able to play with her dog. First full day of exercise!  Patient was oriented to gym and equipment including functions, settings,  policies, and procedures.  Patient's individual exercise prescription and treatment plan were reviewed.  All starting workloads were established based on the results of the 6 minute walk test done at initial orientation visit.  The plan for exercise progression was also introduced and progression will be customized based on patient's performance and goals.         Exercise Goals and Review:   Exercise Goals Re-Evaluation :   Discharge Exercise Prescription (Final Exercise Prescription Changes):     Exercise Prescription Changes - 08/07/16 1200      Response to Exercise   Blood Pressure (Admit) 130/72   Blood Pressure (Exercise) 124/74   Blood Pressure (Exit) 126/72   Heart Rate (Admit) 123 bpm   Heart Rate (Exercise) 155 bpm   Heart Rate (Exit) 83 bpm   Oxygen Saturation (Admit) 93 %   Oxygen Saturation (Exercise) 90 %   Oxygen Saturation (Exit) 97 %   Rating of Perceived Exertion (Exercise) 11   Perceived Dyspnea (Exercise) 2   Duration Progress to 45 minutes of aerobic exercise without signs/symptoms of physical distress   Intensity THRR unchanged     Progression   Progression Continue to progress workloads to  maintain intensity without signs/symptoms of physical distress.     Resistance Training   Training Prescription Yes   Weight 2   Reps 10-15     Interval Training   Interval Training No     Oxygen   Oxygen Continuous   Liters 3     Bike   Level 1  AD   Minutes 8     NuStep   Level 2   Minutes 16   METs 2.1      Nutrition:  Target Goals: Understanding of nutrition guidelines, daily intake of sodium '1500mg'$ , cholesterol '200mg'$ , calories 30% from fat and 7% or less from saturated fats, daily to have 5 or more servings of fruits and vegetables.  Biometrics:     Pre Biometrics - 06/11/16 1503      Pre Biometrics   Height 5' 4.9" (1.648 m)   Weight 117 lb 9.6 oz (53.3 kg)   Waist Circumference 32.5 inches   Hip Circumference 35 inches   Waist to  Hip Ratio 0.93 %   BMI (Calculated) 19.7       Nutrition Therapy Plan and Nutrition Goals:   Nutrition Discharge: Rate Your Plate Scores:   Nutrition Goals Re-Evaluation:   Nutrition Goals Discharge (Final Nutrition Goals Re-Evaluation):   Psychosocial: Target Goals: Acknowledge presence or absence of significant depression and/or stress, maximize coping skills, provide positive support system. Participant is able to verbalize types and ability to use techniques and skills needed for reducing stress and depression.   Initial Review & Psychosocial Screening:     Initial Psych Review & Screening - 06/11/16 [place]? Yes   Comments Christine Serrano has good wupport from her sister-in-law who lives with Christine Serrano. Christine Serrano has had much loss in her life this past year - her boyfriend of 71 years, father-in-law , a few other relatives, and her horse of 18 years, plus her dog of 18 years. She will benefit from meeting with our Health Counselor, Lucianne Lei and participation in [place].     Barriers   Psychosocial barriers to participate in program The patient should benefit from training in stress management and relaxation.     Screening Interventions   Interventions Program counselor consult;Encouraged to exercise      Quality of Life Scores:     Quality of Life - 06/11/16 1502      Quality of Life Scores   Health/Function Pre 20.63 %   Socioeconomic Pre 19.86 %   Psych/Spiritual Pre 20.57 %   Family Pre 20.2 %   GLOBAL Pre 20.4 %      PHQ-9: Recent Review Flowsheet Data    Depression screen [facility] 2/9 06/11/2016   Decreased Interest 2   Down, Depressed, Hopeless 1   PHQ - 2 Score 3   Altered sleeping 1   Tired, decreased energy 3   Change in appetite 0   Feeling bad or failure about yourself  1   Trouble concentrating 0   Moving slowly or fidgety/restless 0   Suicidal thoughts 0   PHQ-9 Score 8   Difficult doing  work/chores Very difficult     Interpretation of Total Score  Total Score Depression Severity:  1-4 = Minimal depression, 5-9 = Mild depression, 10-14 = Moderate depression, 15-19 = Moderately severe depression, 20-27 = Severe depression   Psychosocial Evaluation and Intervention:     Psychosocial Evaluation - 08/07/16 1110  Psychosocial Evaluation & Interventions   Interventions Relaxation education;Stress management education;Encouraged to exercise with the program and follow exercise prescription   Comments Counselor met with Christine Serrano today for initial psychosocial evaluation.  She is a 56 yr old who has been diagnosed with COPD and is on Oxygen 24/7 now.  She has a significant other she has known most of her life who has been living with her for 8 months now.  Christine Serrano reports a history of chronic sleeping problems with possibly 2/7 nights getting 6 hours of sleep.  The remainder of the time it is typical for 3-4 hours of sleep.  She states her appetite is up and down lately.  She reports a history of depression subsequent to multiple losses over the past several years, including a fiance who committed suicide and a father figure in the same year; as well as her dog of 18 years and a horse she had for 18 years that she had to find a new home for recently.  Christine Serrano states her sleep and mood issues have been worse since all of this occurred beginning in June of 2016.  She reports being on medications for depression and anxiety and states it is managing the symptoms "okay."  Her primary stressors are her health and her struggle to quit smoking.  Counselor suggested a graduated reduction plan of one less cigarette per day over a period of time to cut back on the reported pack and a half each day.  She agreed to give this a try.  She is on Chantix for smoking cessation, but reports this is not helpful.  Her goals for this program are to get healthier and to hopefully quit smoking after almost 47  years ("since the age of 81").  Counselor recommended a Grief Share group for support with the multiple significant losses over such a short period of time.  Counselor provided local group contact information with times.  Also, counselor mentioned Krishika may need to speak with her Dr.  about a sleep study since her problems have been chronic and the medications prescribed are reportedly not being very effective.  Counselor and staff will be following with Christine Serrano throughout the course of this program.   Continue Psychosocial Services  Yes  Follow re: sleep cessation and graduated plan; as well as mood and sleep if not improved with consistent exercise.  Client was also given info on Grief support groups locally.      Psychosocial Re-Evaluation:     Psychosocial Re-Evaluation    Christine Serrano 08/09/16 1052             Psychosocial Re-Evaluation   Comments Christine Serrano called to say she was sorry that she overslept today and she would not be able to attend the 11:30am Pulm Rehab class today either. Christine Serrano reported that the first Pulmonary Rehab class helped her and she has cut back on her smoking because of that first [facility] Rehab session.        Interventions Encouraged to attend Pulmonary Rehabilitation for the exercise          Psychosocial Discharge (Final Psychosocial Re-Evaluation):     Psychosocial Re-Evaluation - 08/09/16 1052      Psychosocial Re-Evaluation   Comments Christine Serrano called to say she was sorry that she overslept today and she would not be able to attend the 11:30am Pulm Rehab class today either. Christine Serrano reported that the first Pulmonary Rehab class helped her and she has cut back on her smoking because  of that first [place] session.    Interventions Encouraged to attend Pulmonary Rehabilitation for the exercise      Education: Education Goals: Education classes will be provided on a weekly basis, covering required topics. Participant will state understanding/return demonstration of  topics presented.  Learning Barriers/Preferences:     Learning Barriers/Preferences - 06/11/16 1445      Learning Barriers/Preferences   Learning Barriers None   Learning Preferences None      Education Topics: Initial Evaluation Education: - Verbal, written and demonstration of respiratory meds, RPE/PD scales, oximetry and breathing techniques. Instruction on use of nebulizers and MDIs: cleaning and proper use, rinsing mouth with steroid doses and importance of monitoring MDI activations.   Pulmonary Rehab from 08/07/2016 in [facility] Cardiac and Pulmonary Rehab  Date  06/11/16  Educator  LB  Instruction Review Code  2- meets goals/outcomes      General Nutrition Guidelines/Fats and Fiber: -Group instruction provided by verbal, written material, models and posters to present the general guidelines for heart healthy nutrition. Gives an explanation and review of dietary fats and fiber.   Controlling Sodium/Reading Food Labels: -Group verbal and written material supporting the discussion of sodium use in heart healthy nutrition. Review and explanation with models, verbal and written materials for utilization of the food label.   Exercise Physiology & Risk Factors: - Group verbal and written instruction with models to review the exercise physiology of the cardiovascular system and associated critical values. Details cardiovascular disease risk factors and the goals associated with each risk factor.   Aerobic Exercise & Resistance Training: - Gives group verbal and written discussion on the health impact of inactivity. On the components of aerobic and resistive training programs and the benefits of this training and how to safely progress through these programs.   Flexibility, Balance, General Exercise Guidelines: - Provides group verbal and written instruction on the benefits of flexibility and balance training programs. Provides general exercise guidelines with specific guidelines to  those with heart or lung disease. Demonstration and skill practice provided.   Stress Management: - Provides group verbal and written instruction about the health risks of elevated stress, cause of high stress, and healthy ways to reduce stress.   Depression: - Provides group verbal and written instruction on the correlation between heart/lung disease and depressed mood, treatment options, and the stigmas associated with seeking treatment.   Exercise & Equipment Safety: - Individual verbal instruction and demonstration of equipment use and safety with use of the equipment.   Pulmonary Rehab from 08/07/2016 in [facility] Cardiac and Pulmonary Rehab  Date  08/07/16  Educator  [facility]  Instruction Review Code  2- meets goals/outcomes      Infection Prevention: - Provides verbal and written material to individual with discussion of infection control including proper hand washing and proper equipment cleaning during exercise session.   Pulmonary Rehab from 08/07/2016 in [facility] Cardiac and Pulmonary Rehab  Date  08/07/16  Educator  [facility]  Instruction Review Code  2- meets goals/outcomes      Falls Prevention: - Provides verbal and written material to individual with discussion of falls prevention and safety.   Pulmonary Rehab from 08/07/2016 in [facility] Cardiac and Pulmonary Rehab  Date  06/11/16  Educator  LB  Instruction Review Code  2- meets goals/outcomes      Diabetes: - Individual verbal and written instruction to review signs/symptoms of diabetes, desired ranges of glucose level fasting, after meals and with exercise. Advice that pre and post  exercise glucose checks will be done for 3 sessions at entry of program.   Chronic Lung Diseases: - Group verbal and written instruction to review new updates, new respiratory medications, new advancements in procedures and treatments. Provide informative websites and "800" numbers of self-education.   Lung Procedures: - Group verbal and written  instruction to describe testing methods done to diagnose lung disease. Review the outcome of test results. Describe the treatment choices: Pulmonary Function Tests, ABGs and oximetry.   Energy Conservation: - Provide group verbal and written instruction for methods to conserve energy, plan and organize activities. Instruct on pacing techniques, use of adaptive equipment and posture/positioning to relieve shortness of breath.   Triggers: - Group verbal and written instruction to review types of environmental controls: home humidity, furnaces, filters, dust mite/pet prevention, HEPA vacuums. To discuss weather changes, air quality and the benefits of nasal washing.   Pulmonary Rehab from 08/07/2016 in [facility] Cardiac and Pulmonary Rehab  Date  08/07/16  Educator  LB  Instruction Review Code  2- meets goals/outcomes      Exacerbations: - Group verbal and written instruction to provide: warning signs, infection symptoms, calling MD promptly, preventive modes, and value of vaccinations. Review: effective airway clearance, coughing and/or vibration techniques. Create an Sports administrator.   Oxygen: - Individual and group verbal and written instruction on oxygen therapy. Includes supplement oxygen, available portable oxygen systems, continuous and intermittent flow rates, oxygen safety, concentrators, and Medicare reimbursement for oxygen.   Pulmonary Rehab from 08/07/2016 in [facility] Cardiac and Pulmonary Rehab  Date  06/11/16  Educator  LB  Instruction Review Code  2- meets goals/outcomes      Respiratory Medications: - Group verbal and written instruction to review medications for lung disease. Drug class, frequency, complications, importance of spacers, rinsing mouth after steroid MDI's, and proper cleaning methods for nebulizers.   Pulmonary Rehab from 08/07/2016 in [facility] Cardiac and Pulmonary Rehab  Date  06/11/16  Educator  LB  Instruction Review Code  2- meets goals/outcomes      AED/CPR: -  Group verbal and written instruction with the use of models to demonstrate the basic use of the AED with the basic ABC's of resuscitation.   Breathing Retraining: - Provides individuals verbal and written instruction on purpose, frequency, and proper technique of diaphragmatic breathing and pursed-lipped breathing. Applies individual practice skills.   Pulmonary Rehab from 08/07/2016 in [facility] Cardiac and Pulmonary Rehab  Date  06/11/16  Educator  LB  Instruction Review Code  2- meets goals/outcomes      Anatomy and Physiology of the Lungs: - Group verbal and written instruction with the use of models to provide basic lung anatomy and physiology related to function, structure and complications of lung disease.   Heart Failure: - Group verbal and written instruction on the basics of heart failure: signs/symptoms, treatments, explanation of ejection fraction, enlarged heart and cardiomyopathy.   Sleep Apnea: - Individual verbal and written instruction to review Obstructive Sleep Apnea. Review of risk factors, methods for diagnosing and types of masks and machines for OSA.   Anxiety: - Provides group, verbal and written instruction on the correlation between heart/lung disease and anxiety, treatment options, and management of anxiety.   Relaxation: - Provides group, verbal and written instruction about the benefits of relaxation for patients with heart/lung disease. Also provides patients with examples of relaxation techniques.   Knowledge Questionnaire Score:     Knowledge Questionnaire Score - 06/11/16 1445      Knowledge  Questionnaire Score   Pre Score 7/10       Core Components/Risk Factors/Patient Goals at Admission:     Personal Goals and Risk Factors at Admission - 06/11/16 1452      Core Components/Risk Factors/Patient Goals on Admission    Weight Management Yes;Weight Maintenance;Weight Gain   Intervention Weight Management: Develop a combined nutrition and  exercise program designed to reach desired caloric intake, while maintaining appropriate intake of nutrient and fiber, sodium and fats, and appropriate energy expenditure required for the weight goal.;Weight Management: Provide education and appropriate resources to help participant work on and attain dietary goals.   Admit Weight 117 lb 9.6 oz (53.3 kg)   Goal Weight: Short Term 120 lb (54.4 kg)   Goal Weight: Long Term 122 lb (55.3 kg)   Expected Outcomes Weight Gain: Understanding of general recommendations for a high calorie, high protein meal plan that promotes weight gain by distributing calorie intake throughout the day with the consumption for 4-5 meals, snacks, and/or supplements;Short Term: Continue to assess and modify interventions until short term weight is achieved;Long Term: Adherence to nutrition and physical activity/exercise program aimed toward attainment of established weight goal   Sedentary Yes   Intervention Provide advice, education, support and counseling about physical activity/exercise needs.;Develop an individualized exercise prescription for aerobic and resistive training based on initial evaluation findings, risk stratification, comorbidities and participant's personal goals.   Expected Outcomes Achievement of increased cardiorespiratory fitness and enhanced flexibility, muscular endurance and strength shown through measurements of functional capacity and personal statement of participant.   Increase Strength and Stamina Yes   Intervention Provide advice, education, support and counseling about physical activity/exercise needs.;Develop an individualized exercise prescription for aerobic and resistive training based on initial evaluation findings, risk stratification, comorbidities and participant's personal goals.   Expected Outcomes Achievement of increased cardiorespiratory fitness and enhanced flexibility, muscular endurance and strength shown through measurements of  functional capacity and personal statement of participant.   Improve shortness of breath with ADL's Yes   Intervention Provide education, individualized exercise plan and daily activity instruction to help decrease symptoms of SOB with activities of daily living.   Expected Outcomes Short Term: Achieves a reduction of symptoms when performing activities of daily living.   Develop more efficient breathing techniques such as purse lipped breathing and diaphragmatic breathing; and practicing self-pacing with activity Yes   Intervention Provide education, demonstration and support about specific breathing techniuqes utilized for more efficient breathing. Include techniques such as pursed lipped breathing, diaphragmatic breathing and self-pacing activity.   Expected Outcomes Short Term: Participant will be able to demonstrate and use breathing techniques as needed throughout daily activities.   Increase knowledge of respiratory medications and ability to use respiratory devices properly  Yes   Intervention Provide education and demonstration as needed of appropriate use of medications, inhalers, and oxygen therapy.  Ventolin, Symbicort, Spiriva MDI's; Duoneb; Oxygen 2l/m   Expected Outcomes Short Term: Achieves understanding of medications use. Understands that oxygen is a medication prescribed by physician. Demonstrates appropriate use of inhaler and oxygen therapy.      Core Components/Risk Factors/Patient Goals Review:      Goals and Risk Factor Review    Row Serrano 08/21/16 1552             Core Components/Risk Factors/Patient Goals Review   Personal Goals Review Develop more efficient breathing techniques such as purse lipped breathing and diaphragmatic breathing and practicing self-pacing with activity.       Review  Christine Serrano has attended one session since her 06/11/16 evaluation. She attended 08/07/16 and accomplished her exercise goals on 2l/m oxygen. She was reminded to use PLB breathing  with her exercise .  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 He roommate is getting her drivers license soon.        Expected Outcomes Continue to attend [facility] regularly          Core Components/Risk Factors/Patient Goals at Discharge (Final Review):      Goals and Risk Factor Review - 08/21/16 1552      Core Components/Risk Factors/Patient Goals Review   Personal Goals Review Develop more efficient breathing techniques such as purse lipped breathing and diaphragmatic breathing and practicing self-pacing with activity.   Review Christine Serrano has attended one session since her 06/11/16 evaluation. She attended 08/07/16 and accomplished her exercise goals on 2l/m oxygen. She was reminded to use PLB breathing with her exercise .  Currently she is driving her roommate to work in [facility], and it does affect her attendance. He roommate is getting her drivers license soon.    Expected Outcomes Continue to attend [facility] regularly      ITP Comments:     ITP Comments    Row Serrano 06/26/16 1535 07/09/16 0729 08/09/16 1051 08/29/16 1200 09/10/16 1333   ITP Comments Called Christine Serrano who had an appointment to start [facility] on 06/24/16. She has been sick , has an appointment on 07/02/16 with her physician, and plans to start exercise in [place] as soon as she recovers from her illness. Christine Serrano called to inform [facility] that she would probably return to class 07/15/16. She is recovering from her illness and drives her roommate to work which is somewhat of a conflict. Christine Serrano called to say she was sorry that she overslept today and she would not be able to attend the 11:30am Pulm Rehab class today either. Christine Serrano reported that the first Pulmonary Rehab class helped her and she has cut back on her smoking because of that first [facility] session.  Left message for Christine Serrano since she has not attended [facility] since first visit. Called Christine  Leiner - last attended 08/07/16; first visit was 06/11/16. Left a message to inquire if Christine Nack plans to continue in [place].      Comments: 30 Day Note Review

## 2016-10-01 ENCOUNTER — Ambulatory Visit: Payer: Medicare HMO | Admitting: Family Medicine

## 2016-10-01 ENCOUNTER — Telehealth: Payer: Self-pay | Admitting: Family Medicine

## 2016-10-01 NOTE — Telephone Encounter (Signed)
Charge and reschedule but when next new pt appt available, not earlier.

## 2016-10-01 NOTE — Telephone Encounter (Signed)
Patient did not come in for their appointment today for transfer from [facility] to Dr Diona Browner  Please let me know if patient needs to be contacted immediately for follow up or no follow up needed. Do you want to charge the NSF?

## 2016-10-07 ENCOUNTER — Other Ambulatory Visit: Payer: Self-pay

## 2016-10-07 NOTE — Telephone Encounter (Signed)
Returned call to patient. She received 4 rx that were ordered 09/17/16. Symbicort is the only one she did not receive. Told patient to call [employer] to inquire and if she doesn't Receive inhaler she may call us back.

## 2016-10-08 ENCOUNTER — Encounter: Payer: Self-pay | Admitting: Respiratory Therapy

## 2016-10-08 DIAGNOSIS — J449 Chronic obstructive pulmonary disease, unspecified: Secondary | ICD-10-CM

## 2016-10-08 MED ORDER — SYMBICORT 160-4.5 MCG/ACT IN AERO: 2.0000 | INHALATION_SPRAY | Freq: Two times a day (BID) | RESPIRATORY_TRACT | 4 refills | Status: DC

## 2016-10-19 DIAGNOSIS — J449 Chronic obstructive pulmonary disease, unspecified: Secondary | ICD-10-CM | POA: Diagnosis not present

## 2016-10-21 ENCOUNTER — Encounter: Payer: Self-pay | Admitting: Respiratory Therapy

## 2016-10-21 DIAGNOSIS — J449 Chronic obstructive pulmonary disease, unspecified: Secondary | ICD-10-CM

## 2016-10-21 NOTE — Progress Notes (Signed)
Pulmonary Individual Treatment Plan  Patient Details  Name: Christine Serrano MRN: [id number] Date of Birth: 1960/12/04 Referring Provider:     Pulmonary Rehab from 06/11/2016 in [facility] Cardiac and Pulmonary Rehab  Referring Provider  Merton Border MD      Initial Encounter Date:    Pulmonary Rehab from 06/11/2016 in [facility] Cardiac and Pulmonary Rehab  Date  06/11/16  Referring Provider  Merton Border MD      Visit Diagnosis: COPD, severe ([place])  Patient's Home Medications on Admission:  Current Outpatient Prescriptions:    AMITIZA 24 MCG capsule, , Disp: , Rfl:    aspirin EC 81 MG tablet, Take 81 mg by mouth daily., Disp: , Rfl:    Cholecalciferol (VITAMIN D) 2000 units tablet, Take 2,000 Units by mouth daily., Disp: , Rfl:    cyproheptadine (PERIACTIN) 4 MG tablet, Take 4 mg by mouth 2 (two) times daily., Disp: , Rfl:    diazepam (VALIUM) 5 MG tablet, Take 5 mg by mouth daily as needed for anxiety., Disp: , Rfl:    gabapentin (NEURONTIN) 300 MG capsule, Take 300 mg by mouth 3 (three) times daily., Disp: , Rfl:    ipratropium-albuterol (DUONEB) 0.5-2.5 (3) MG/3ML SOLN, Inhale 3 mLs into the lungs every 6 (six) hours as needed., Disp: 360 mL, Rfl: 3   LINZESS 290 MCG CAPS capsule, Take 290 mcg by mouth daily., Disp: , Rfl:    Multiple Vitamins-Minerals (MULTIVITAMIN ADULT) TABS, Take 1 tablet by mouth daily., Disp: , Rfl:    SYMBICORT 160-4.5 MCG/ACT inhaler, Inhale 2 puffs into the lungs 2 (two) times daily., Disp: 3 Inhaler, Rfl: 4   tiotropium (SPIRIVA) 18 MCG inhalation capsule, Place 1 capsule (18 mcg total) into inhaler and inhale daily., Disp: 90 capsule, Rfl: 4   traZODone (DESYREL) 50 MG tablet, Take 100 mg by mouth at bedtime., Disp: , Rfl:    varenicline (CHANTIX CONTINUING MONTH PAK) 1 MG tablet, Take 1 tablet (1 mg total) by mouth 2 (two) times daily., Disp: 60 tablet, Rfl: 5   varenicline (CHANTIX STARTING MONTH PAK) 0.5 MG X 11 & 1 MG X 42 tablet, Take one  0.5 mg tablet by mouth once daily for 3 days, then increase to one 0.5 mg tablet twice daily for 4 days, then increase to one 1 mg tablet twice daily., Disp: 53 tablet, Rfl: 0   venlafaxine XR (EFFEXOR-XR) 150 MG 24 hr capsule, Take 150 mg by mouth daily with breakfast., Disp: , Rfl:    VENTOLIN HFA 108 (90 Base) MCG/ACT inhaler, Inhale 2 puffs into the lungs every 6 (six) hours as needed for shortness of breath., Disp: 6 Inhaler, Rfl: 4   vitamin B-12 (CYANOCOBALAMIN) 1000 MCG tablet, Take 1,000 mcg by mouth daily., Disp: , Rfl:    vitamin E 400 UNIT capsule, Take 400 Units by mouth daily. , Disp: , Rfl:   Past Medical History: Past Medical History:  Diagnosis Date   Chronic idiopathic constipation    COPD (chronic obstructive pulmonary disease) (HCC)    Depression    Endometriosis     Tobacco Use: History  Smoking Status   Current Every Day Smoker   Packs/day: 3.00   Years: 47.00   Types: Cigarettes  Smokeless Tobacco   Never Used    Labs: Recent Review Scientist, physiological    Labs for ITP Cardiac and Pulmonary Rehab Latest Ref Rng & Units 04/04/2009 04/09/2016   PHART 7.350 - 7.450 7.302(L) 7.30(L)   PCO2ART 32.0 - 48.0  mmHg 59.5 CRITICAL RESULT CALLED TO, READ BACK BY AND VERIFIED WITH: [facility] 04/04/09 AT 1440 CRITICAL RESULT CALLED TO, READ BACK BY AND VERIFIED WITH: [facility] 04/04/09 AT 1440 BY BROADNAX,L.RRT(HH) 65(H)   HCO3 20.0 - 28.0 mmol/L 28.5(H) 32.0(H)   TCO2 0 - 100 mmol/L 25.9 -   O2SAT % 95.5 88.7       ADL UCSD:     Pulmonary Assessment Scores    Row Name 06/11/16 1447         ADL UCSD   ADL Phase Entry     SOB Score total 75     Rest 2     Walk 4     Stairs 5     Bath 3     Dress 3     Shop 5        Pulmonary Function Assessment:     Pulmonary Function Assessment - 06/11/16 1445      Initial Spirometry Results   FVC% 31 %   FEV1% 27 %   FEV1/FVC Ratio 68.82   Comments Test Date 06/11/16     Post Bronchodilator  Spirometry Results   FVC% 51 %   FEV1% 36 %   FEV1/FVC Ratio 59.71     Breath   Bilateral Breath Sounds Decreased;Expiratory;Wheezes   Shortness of Breath Yes;Limiting activity      Exercise Target Goals:    Exercise Program Goal: Individual exercise prescription set with THRR, safety & activity barriers. Participant demonstrates ability to understand and report RPE using BORG scale, to self-measure pulse accurately, and to acknowledge the importance of the exercise prescription.  Exercise Prescription Goal: Starting with aerobic activity 30 plus minutes a day, 3 days per week for initial exercise prescription. Provide home exercise prescription and guidelines that participant acknowledges understanding prior to discharge.  Activity Barriers & Risk Stratification:     Activity Barriers & Cardiac Risk Stratification - 06/11/16 1445      Activity Barriers & Cardiac Risk Stratification   Activity Barriers Shortness of Breath;Deconditioning;Muscular Weakness   Cardiac Risk Stratification Moderate      6 Minute Walk:     6 Minute Walk    Row Name 06/11/16 1447         6 Minute Walk   Phase Initial     Distance 920 feet     Walk Time 5.3 minutes     # of Rest Breaks 2  14 sec, 28 sec     MPH 1.97     METS 3.75     RPE 15     Perceived Dyspnea  7     VO2 Peak 13.12     Symptoms Yes (comment)     Comments fatigue and SOB     Resting HR 118 bpm     Resting BP 126/56     Max Ex. HR 146 bpm     Max Ex. BP 128/64     2 Minute Post BP 110/60       Interval HR   Baseline HR 118     1 Minute HR 124     2 Minute HR 139     3 Minute HR 143     4 Minute HR 136     5 Minute HR 144     6 Minute HR 146     2 Minute Post HR 122     Interval Heart Rate? Yes       Interval Oxygen   Interval Oxygen?  Yes     Baseline Oxygen Saturation % 94 %  95% intermitten on her concentrater     Baseline Liters of Oxygen 2 L     1 Minute Oxygen Saturation % 88 %  stopped at 1:27  82%     1 Minute Liters of Oxygen 2 L     2 Minute Oxygen Saturation % 89 %     2 Minute Liters of Oxygen 2 L     3 Minute Oxygen Saturation % 90 %  stopped for rest at 3:13 86%     3 Minute Liters of Oxygen 2 L     4 Minute Oxygen Saturation % 86 %     4 Minute Liters of Oxygen 2 L     5 Minute Oxygen Saturation % 88 %     5 Minute Liters of Oxygen 2 L     6 Minute Oxygen Saturation % 88 %     6 Minute Liters of Oxygen 2 L     2 Minute Post Oxygen Saturation % 92 %     2 Minute Post Liters of Oxygen 2 L       Oxygen Initial Assessment:     Oxygen Initial Assessment - 08/26/16 0834      Home Oxygen   Home Oxygen Device Portable Concentrator;Home Concentrator   Sleep Oxygen Prescription Continuous   Liters per minute 2   Home Exercise Oxygen Prescription Continuous   Liters per minute 2   Home at Rest Exercise Oxygen Prescription Continuous   Liters per minute 2     Program Oxygen Prescription   Program Oxygen Prescription Continuous;E-Tanks   Liters per minute 2      Oxygen Re-Evaluation:   Oxygen Discharge (Final Oxygen Re-Evaluation):   Initial Exercise Prescription:     Initial Exercise Prescription - 06/11/16 1400      Date of Initial Exercise RX and Referring Provider   Date 06/11/16   Referring Provider Billy Fischer MD     Oxygen   Oxygen Continuous   Liters 3     Treadmill   MPH 1.5   Grade 0   Minutes 15  3 min x5   METs 2.15     NuStep   Level 2   SPM --  60-80 spm   Minutes 15   METs 2     REL-XR   Level 1   Watts --  speed 40-50   Minutes 15   METs 2     Prescription Details   Frequency (times per week) 3   Duration Progress to 45 minutes of aerobic exercise without signs/symptoms of physical distress     Intensity   THRR 40-80% of Max Heartrate 136-154   Ratings of Perceived Exertion 11-15   Perceived Dyspnea 0-4     Progression   Progression Continue to progress workloads to maintain intensity without signs/symptoms  of physical distress.     Resistance Training   Training Prescription Yes   Weight 2 lbs   Reps 10-15      Perform Capillary Blood Glucose checks as needed.  Exercise Prescription Changes:     Exercise Prescription Changes    Row Name 06/11/16 1400 08/07/16 1200           Response to Exercise   Blood Pressure (Admit) 126/56 130/72      Blood Pressure (Exercise) 128/64 124/74      Blood Pressure (Exit) 110/60 126/72      Heart  Rate (Admit) 118 bpm 123 bpm      Heart Rate (Exercise) 146 bpm 155 bpm      Heart Rate (Exit) 122 bpm 83 bpm      Oxygen Saturation (Admit) 94 % 93 %      Oxygen Saturation (Exercise) 82 % 90 %      Oxygen Saturation (Exit) 92 % 97 %      Rating of Perceived Exertion (Exercise) 15 11      Perceived Dyspnea (Exercise) 7 2      Symptoms fatigue and SOB  --      Duration  -- Progress to 45 minutes of aerobic exercise without signs/symptoms of physical distress      Intensity  -- THRR unchanged        Progression   Progression  -- Continue to progress workloads to maintain intensity without signs/symptoms of physical distress.        Resistance Training   Training Prescription  -- Yes      Weight  -- 2      Reps  -- 10-15        Interval Training   Interval Training  -- No        Oxygen   Oxygen  -- Continuous      Liters  -- 3        Bike   Level  -- 1  AD      Minutes  -- 8        NuStep   Level  -- 2      Minutes  -- 16      METs  -- 2.1        Exercise Review   Progression --  walk test results  --         Exercise Comments:     Exercise Comments    Row Name 06/11/16 1503 08/07/16 1229         Exercise Comments Haadiya wants to be able to go out and walk and to able to play with her dog. First full day of exercise!  Patient was oriented to gym and equipment including functions, settings, policies, and procedures.  Patient's individual exercise prescription and treatment plan were reviewed.  All starting workloads were  established based on the results of the 6 minute walk test done at initial orientation visit.  The plan for exercise progression was also introduced and progression will be customized based on patient's performance and goals.         Exercise Goals and Review:   Exercise Goals Re-Evaluation :   Discharge Exercise Prescription (Final Exercise Prescription Changes):     Exercise Prescription Changes - 08/07/16 1200      Response to Exercise   Blood Pressure (Admit) 130/72   Blood Pressure (Exercise) 124/74   Blood Pressure (Exit) 126/72   Heart Rate (Admit) 123 bpm   Heart Rate (Exercise) 155 bpm   Heart Rate (Exit) 83 bpm   Oxygen Saturation (Admit) 93 %   Oxygen Saturation (Exercise) 90 %   Oxygen Saturation (Exit) 97 %   Rating of Perceived Exertion (Exercise) 11   Perceived Dyspnea (Exercise) 2   Duration Progress to 45 minutes of aerobic exercise without signs/symptoms of physical distress   Intensity THRR unchanged     Progression   Progression Continue to progress workloads to maintain intensity without signs/symptoms of physical distress.     Resistance Training   Training Prescription Yes   Weight  2   Reps 10-15     Interval Training   Interval Training No     Oxygen   Oxygen Continuous   Liters 3     Bike   Level 1  AD   Minutes 8     NuStep   Level 2   Minutes 16   METs 2.1      Nutrition:  Target Goals: Understanding of nutrition guidelines, daily intake of sodium '1500mg'$ , cholesterol '200mg'$ , calories 30% from fat and 7% or less from saturated fats, daily to have 5 or more servings of fruits and vegetables.  Biometrics:     Pre Biometrics - 06/11/16 1503      Pre Biometrics   Height 5' 4.9" (1.648 m)   Weight 117 lb 9.6 oz (53.3 kg)   Waist Circumference 32.5 inches   Hip Circumference 35 inches   Waist to Hip Ratio 0.93 %   BMI (Calculated) 19.7       Nutrition Therapy Plan and Nutrition Goals:   Nutrition Discharge: Rate Your  Plate Scores:   Nutrition Goals Re-Evaluation:   Nutrition Goals Discharge (Final Nutrition Goals Re-Evaluation):   Psychosocial: Target Goals: Acknowledge presence or absence of significant depression and/or stress, maximize coping skills, provide positive support system. Participant is able to verbalize types and ability to use techniques and skills needed for reducing stress and depression.   Initial Review & Psychosocial Screening:     Initial Psych Review & Screening - 06/11/16 [place]? Yes   Comments Ms Glaza has good wupport from her sister-in-law who lives with Ms Reedy. Ms Whisonant has had much loss in her life this past year - her boyfriend of 5 years, father-in-law , a few other relatives, and her horse of 18 years, plus her dog of 18 years. She will benefit from meeting with our Health Counselor, Lucianne Lei and participation in [place].     Barriers   Psychosocial barriers to participate in program The patient should benefit from training in stress management and relaxation.     Screening Interventions   Interventions Program counselor consult;Encouraged to exercise      Quality of Life Scores:     Quality of Life - 06/11/16 1502      Quality of Life Scores   Health/Function Pre 20.63 %   Socioeconomic Pre 19.86 %   Psych/Spiritual Pre 20.57 %   Family Pre 20.2 %   GLOBAL Pre 20.4 %      PHQ-9: Recent Review Flowsheet Data    Depression screen [facility] 2/9 06/11/2016   Decreased Interest 2   Down, Depressed, Hopeless 1   PHQ - 2 Score 3   Altered sleeping 1   Tired, decreased energy 3   Change in appetite 0   Feeling bad or failure about yourself  1   Trouble concentrating 0   Moving slowly or fidgety/restless 0   Suicidal thoughts 0   PHQ-9 Score 8   Difficult doing work/chores Very difficult     Interpretation of Total Score  Total Score Depression Severity:  1-4 = Minimal depression, 5-9 = Mild  depression, 10-14 = Moderate depression, 15-19 = Moderately severe depression, 20-27 = Severe depression   Psychosocial Evaluation and Intervention:     Psychosocial Evaluation - 08/07/16 1110      Psychosocial Evaluation & Interventions   Interventions Relaxation education;Stress management education;Encouraged to exercise with the program and follow exercise prescription  Comments Counselor met with Ms. Mcmillon today for initial psychosocial evaluation.  She is a 56 yr old who has been diagnosed with COPD and is on Oxygen 24/7 now.  She has a significant other she has known most of her life who has been living with her for 8 months now.  Ailsa reports a history of chronic sleeping problems with possibly 2/7 nights getting 6 hours of sleep.  The remainder of the time it is typical for 3-4 hours of sleep.  She states her appetite is up and down lately.  She reports a history of depression subsequent to multiple losses over the past several years, including a fiance who committed suicide and a father figure in the same year; as well as her dog of 18 years and a horse she had for 18 years that she had to find a new home for recently.  Manaia states her sleep and mood issues have been worse since all of this occurred beginning in June of 2016.  She reports being on medications for depression and anxiety and states it is managing the symptoms "okay."  Her primary stressors are her health and her struggle to quit smoking.  Counselor suggested a graduated reduction plan of one less cigarette per day over a period of time to cut back on the reported pack and a half each day.  She agreed to give this a try.  She is on Chantix for smoking cessation, but reports this is not helpful.  Her goals for this program are to get healthier and to hopefully quit smoking after almost 47 years ("since the age of 50").  Counselor recommended a Grief Share group for support with the multiple significant losses over such a short  period of time.  Counselor provided local group contact information with times.  Also, counselor mentioned Jameria may need to speak with her Dr.  about a sleep study since her problems have been chronic and the medications prescribed are reportedly not being very effective.  Counselor and staff will be following with Everlee throughout the course of this program.   Continue Psychosocial Services  Yes  Follow re: sleep cessation and graduated plan; as well as mood and sleep if not improved with consistent exercise.  Client was also given info on Grief support groups locally.      Psychosocial Re-Evaluation:     Psychosocial Re-Evaluation    [place] Name 08/09/16 1052             Psychosocial Re-Evaluation   Comments Kennedi called to say she was sorry that she overslept today and she would not be able to attend the 11:30am Pulm Rehab class today either. Glora reported that the first Pulmonary Rehab class helped her and she has cut back on her smoking because of that first [facility] Rehab session.        Interventions Encouraged to attend Pulmonary Rehabilitation for the exercise          Psychosocial Discharge (Final Psychosocial Re-Evaluation):     Psychosocial Re-Evaluation - 08/09/16 1052      Psychosocial Re-Evaluation   Comments Ansley called to say she was sorry that she overslept today and she would not be able to attend the 11:30am Pulm Rehab class today either. Molli reported that the first Pulmonary Rehab class helped her and she has cut back on her smoking because of that first [facility] Rehab session.    Interventions Encouraged to attend Pulmonary Rehabilitation for the exercise  Education: Education Goals: Education classes will be provided on a weekly basis, covering required topics. Participant will state understanding/return demonstration of topics presented.  Learning Barriers/Preferences:     Learning Barriers/Preferences - 06/11/16 1445      Learning Barriers/Preferences    Learning Barriers None   Learning Preferences None      Education Topics: Initial Evaluation Education: - Verbal, written and demonstration of respiratory meds, RPE/PD scales, oximetry and breathing techniques. Instruction on use of nebulizers and MDIs: cleaning and proper use, rinsing mouth with steroid doses and importance of monitoring MDI activations.   Pulmonary Rehab from 08/07/2016 in [facility] Cardiac and Pulmonary Rehab  Date  06/11/16  Educator  LB  Instruction Review Code  2- meets goals/outcomes      General Nutrition Guidelines/Fats and Fiber: -Group instruction provided by verbal, written material, models and posters to present the general guidelines for heart healthy nutrition. Gives an explanation and review of dietary fats and fiber.   Controlling Sodium/Reading Food Labels: -Group verbal and written material supporting the discussion of sodium use in heart healthy nutrition. Review and explanation with models, verbal and written materials for utilization of the food label.   Exercise Physiology & Risk Factors: - Group verbal and written instruction with models to review the exercise physiology of the cardiovascular system and associated critical values. Details cardiovascular disease risk factors and the goals associated with each risk factor.   Aerobic Exercise & Resistance Training: - Gives group verbal and written discussion on the health impact of inactivity. On the components of aerobic and resistive training programs and the benefits of this training and how to safely progress through these programs.   Flexibility, Balance, General Exercise Guidelines: - Provides group verbal and written instruction on the benefits of flexibility and balance training programs. Provides general exercise guidelines with specific guidelines to those with heart or lung disease. Demonstration and skill practice provided.   Stress Management: - Provides group verbal and written  instruction about the health risks of elevated stress, cause of high stress, and healthy ways to reduce stress.   Depression: - Provides group verbal and written instruction on the correlation between heart/lung disease and depressed mood, treatment options, and the stigmas associated with seeking treatment.   Exercise & Equipment Safety: - Individual verbal instruction and demonstration of equipment use and safety with use of the equipment.   Pulmonary Rehab from 08/07/2016 in [facility] Cardiac and Pulmonary Rehab  Date  08/07/16  Educator  [facility]  Instruction Review Code  2- meets goals/outcomes      Infection Prevention: - Provides verbal and written material to individual with discussion of infection control including proper hand washing and proper equipment cleaning during exercise session.   Pulmonary Rehab from 08/07/2016 in [facility] Cardiac and Pulmonary Rehab  Date  08/07/16  Educator  [facility]  Instruction Review Code  2- meets goals/outcomes      Falls Prevention: - Provides verbal and written material to individual with discussion of falls prevention and safety.   Pulmonary Rehab from 08/07/2016 in [facility] Cardiac and Pulmonary Rehab  Date  06/11/16  Educator  LB  Instruction Review Code  2- meets goals/outcomes      Diabetes: - Individual verbal and written instruction to review signs/symptoms of diabetes, desired ranges of glucose level fasting, after meals and with exercise. Advice that pre and post exercise glucose checks will be done for 3 sessions at entry of program.   Chronic Lung Diseases: - Group verbal and written  instruction to review new updates, new respiratory medications, new advancements in procedures and treatments. Provide informative websites and "800" numbers of self-education.   Lung Procedures: - Group verbal and written instruction to describe testing methods done to diagnose lung disease. Review the outcome of test results. Describe the treatment choices:  Pulmonary Function Tests, ABGs and oximetry.   Energy Conservation: - Provide group verbal and written instruction for methods to conserve energy, plan and organize activities. Instruct on pacing techniques, use of adaptive equipment and posture/positioning to relieve shortness of breath.   Triggers: - Group verbal and written instruction to review types of environmental controls: home humidity, furnaces, filters, dust mite/pet prevention, HEPA vacuums. To discuss weather changes, air quality and the benefits of nasal washing.   Pulmonary Rehab from 08/07/2016 in [facility] Cardiac and Pulmonary Rehab  Date  08/07/16  Educator  LB  Instruction Review Code  2- meets goals/outcomes      Exacerbations: - Group verbal and written instruction to provide: warning signs, infection symptoms, calling MD promptly, preventive modes, and value of vaccinations. Review: effective airway clearance, coughing and/or vibration techniques. Create an Sports administrator.   Oxygen: - Individual and group verbal and written instruction on oxygen therapy. Includes supplement oxygen, available portable oxygen systems, continuous and intermittent flow rates, oxygen safety, concentrators, and Medicare reimbursement for oxygen.   Pulmonary Rehab from 08/07/2016 in [facility] Cardiac and Pulmonary Rehab  Date  06/11/16  Educator  LB  Instruction Review Code  2- meets goals/outcomes      Respiratory Medications: - Group verbal and written instruction to review medications for lung disease. Drug class, frequency, complications, importance of spacers, rinsing mouth after steroid MDI's, and proper cleaning methods for nebulizers.   Pulmonary Rehab from 08/07/2016 in [facility] Cardiac and Pulmonary Rehab  Date  06/11/16  Educator  LB  Instruction Review Code  2- meets goals/outcomes      AED/CPR: - Group verbal and written instruction with the use of models to demonstrate the basic use of the AED with the basic ABC's of  resuscitation.   Breathing Retraining: - Provides individuals verbal and written instruction on purpose, frequency, and proper technique of diaphragmatic breathing and pursed-lipped breathing. Applies individual practice skills.   Pulmonary Rehab from 08/07/2016 in [facility] Cardiac and Pulmonary Rehab  Date  06/11/16  Educator  LB  Instruction Review Code  2- meets goals/outcomes      Anatomy and Physiology of the Lungs: - Group verbal and written instruction with the use of models to provide basic lung anatomy and physiology related to function, structure and complications of lung disease.   Heart Failure: - Group verbal and written instruction on the basics of heart failure: signs/symptoms, treatments, explanation of ejection fraction, enlarged heart and cardiomyopathy.   Sleep Apnea: - Individual verbal and written instruction to review Obstructive Sleep Apnea. Review of risk factors, methods for diagnosing and types of masks and machines for OSA.   Anxiety: - Provides group, verbal and written instruction on the correlation between heart/lung disease and anxiety, treatment options, and management of anxiety.   Relaxation: - Provides group, verbal and written instruction about the benefits of relaxation for patients with heart/lung disease. Also provides patients with examples of relaxation techniques.   Knowledge Questionnaire Score:     Knowledge Questionnaire Score - 06/11/16 1445      Knowledge Questionnaire Score   Pre Score 7/10       Core Components/Risk Factors/Patient Goals at Admission:  Personal Goals and Risk Factors at Admission - 06/11/16 1452      Core Components/Risk Factors/Patient Goals on Admission    Weight Management Yes;Weight Maintenance;Weight Gain   Intervention Weight Management: Develop a combined nutrition and exercise program designed to reach desired caloric intake, while maintaining appropriate intake of nutrient and fiber, sodium and  fats, and appropriate energy expenditure required for the weight goal.;Weight Management: Provide education and appropriate resources to help participant work on and attain dietary goals.   Admit Weight 117 lb 9.6 oz (53.3 kg)   Goal Weight: Short Term 120 lb (54.4 kg)   Goal Weight: Long Term 122 lb (55.3 kg)   Expected Outcomes Weight Gain: Understanding of general recommendations for a high calorie, high protein meal plan that promotes weight gain by distributing calorie intake throughout the day with the consumption for 4-5 meals, snacks, and/or supplements;Short Term: Continue to assess and modify interventions until short term weight is achieved;Long Term: Adherence to nutrition and physical activity/exercise program aimed toward attainment of established weight goal   Sedentary Yes   Intervention Provide advice, education, support and counseling about physical activity/exercise needs.;Develop an individualized exercise prescription for aerobic and resistive training based on initial evaluation findings, risk stratification, comorbidities and participant's personal goals.   Expected Outcomes Achievement of increased cardiorespiratory fitness and enhanced flexibility, muscular endurance and strength shown through measurements of functional capacity and personal statement of participant.   Increase Strength and Stamina Yes   Intervention Provide advice, education, support and counseling about physical activity/exercise needs.;Develop an individualized exercise prescription for aerobic and resistive training based on initial evaluation findings, risk stratification, comorbidities and participant's personal goals.   Expected Outcomes Achievement of increased cardiorespiratory fitness and enhanced flexibility, muscular endurance and strength shown through measurements of functional capacity and personal statement of participant.   Improve shortness of breath with ADL's Yes   Intervention Provide  education, individualized exercise plan and daily activity instruction to help decrease symptoms of SOB with activities of daily living.   Expected Outcomes Short Term: Achieves a reduction of symptoms when performing activities of daily living.   Develop more efficient breathing techniques such as purse lipped breathing and diaphragmatic breathing; and practicing self-pacing with activity Yes   Intervention Provide education, demonstration and support about specific breathing techniuqes utilized for more efficient breathing. Include techniques such as pursed lipped breathing, diaphragmatic breathing and self-pacing activity.   Expected Outcomes Short Term: Participant will be able to demonstrate and use breathing techniques as needed throughout daily activities.   Increase knowledge of respiratory medications and ability to use respiratory devices properly  Yes   Intervention Provide education and demonstration as needed of appropriate use of medications, inhalers, and oxygen therapy.  Ventolin, Symbicort, Spiriva MDI's; Duoneb; Oxygen 2l/m   Expected Outcomes Short Term: Achieves understanding of medications use. Understands that oxygen is a medication prescribed by physician. Demonstrates appropriate use of inhaler and oxygen therapy.      Core Components/Risk Factors/Patient Goals Review:      Goals and Risk Factor Review    Row Name 08/21/16 1552             Core Components/Risk Factors/Patient Goals Review   Personal Goals Review Develop more efficient breathing techniques such as purse lipped breathing and diaphragmatic breathing and practicing self-pacing with activity.       Review Ms Pankonin has attended one session since her 06/11/16 evaluation. She attended 08/07/16 and accomplished her exercise goals on 2l/m oxygen. She was  reminded to use PLB breathing with her exercise .  Currently she is driving her roommate to work in [employer], and it does affect her attendance. He  roommate is getting her drivers license soon.        Expected Outcomes Continue to attend [facility] regularly          Core Components/Risk Factors/Patient Goals at Discharge (Final Review):      Goals and Risk Factor Review - 08/21/16 1552      Core Components/Risk Factors/Patient Goals Review   Personal Goals Review Develop more efficient breathing techniques such as purse lipped breathing and diaphragmatic breathing and practicing self-pacing with activity.   Review Ms Stachnik has attended one session since her 06/11/16 evaluation. She attended 08/07/16 and accomplished her exercise goals on 2l/m oxygen. She was reminded to use PLB breathing with her exercise .  Currently she is driving her roommate to work in [facility], and it does affect her attendance. He roommate is getting her drivers license soon.    Expected Outcomes Continue to attend [facility] regularly      ITP Comments:     ITP Comments    Row Name 06/26/16 1535 07/09/16 0729 08/09/16 1051 08/29/16 1200 09/10/16 1333   ITP Comments Called Ms Hiott who had an appointment to start [facility] on 06/24/16. She has been sick , has an appointment on 07/02/16 with her physician, and plans to start exercise in [place] as soon as she recovers from her illness. Ms Ferraiolo called to inform [facility] that she would probably return to class 07/15/16. She is recovering from her illness and drives her roommate to work which is somewhat of a conflict. Corrissa called to say she was sorry that she overslept today and she would not be able to attend the 11:30am Pulm Rehab class today either. Shela reported that the first Pulmonary Rehab class helped her and she has cut back on her smoking because of that first [facility] session.  Left message for Delylah since she has not attended [facility] since first visit. Called Ms Rape - last attended 08/07/16; first visit was 06/11/16. Left a message to inquire if Ms Dolinger plans to continue in  [place].   [place] Name 10/08/16 1335 10/21/16 0739         ITP Comments Called Ms Dewilde - she plans to start back to [facility] 10/07/16   30 day note review by Dr Emily Filbert, Medical Director of [facility]         Comments:  30 day note review by Dr Emily Filbert, Medical Director of [facility]

## 2016-10-22 ENCOUNTER — Ambulatory Visit: Payer: Medicare HMO | Admitting: Family Medicine

## 2016-10-22 ENCOUNTER — Telehealth: Payer: Self-pay | Admitting: Family Medicine

## 2016-10-22 NOTE — Telephone Encounter (Signed)
She can schedule another appt,  But needs to be charged today for no show.

## 2016-10-22 NOTE — Telephone Encounter (Signed)
Patient called.  She has no showed for 3 appointments with Dr.Bedsole.  Patient said her truck broke down the first 2 visits and today she had trouble getting up because she is very depressed. She has had 10 deaths in her family in the past year.  Patient said her medications aren't working for the depression.  Can patient schedule another appointment.  Patient can be reached on her cell phone or a detailed message can be left on her home answering machine.

## 2016-10-23 NOTE — Telephone Encounter (Signed)
I spoke with patient and she scheduled appointment with Dr.Bedsole on 10/25/16.  Patient notified of no show charge for yesterday's visit.

## 2016-10-25 ENCOUNTER — Encounter: Payer: Self-pay | Admitting: Family Medicine

## 2016-10-25 ENCOUNTER — Ambulatory Visit (INDEPENDENT_AMBULATORY_CARE_PROVIDER_SITE_OTHER): Payer: Medicare HMO | Admitting: Family Medicine

## 2016-10-25 VITALS — BP 90/56 | HR 127 | Temp 97.8°F | Ht 62.0 in | Wt 121.2 lb

## 2016-10-25 DIAGNOSIS — F1011 Alcohol abuse, in remission: Secondary | ICD-10-CM | POA: Diagnosis not present

## 2016-10-25 DIAGNOSIS — J439 Emphysema, unspecified: Secondary | ICD-10-CM

## 2016-10-25 DIAGNOSIS — F5104 Psychophysiologic insomnia: Secondary | ICD-10-CM | POA: Diagnosis not present

## 2016-10-25 DIAGNOSIS — R911 Solitary pulmonary nodule: Secondary | ICD-10-CM | POA: Diagnosis not present

## 2016-10-25 DIAGNOSIS — R000 Tachycardia, unspecified: Secondary | ICD-10-CM | POA: Diagnosis not present

## 2016-10-25 DIAGNOSIS — F331 Major depressive disorder, recurrent, moderate: Secondary | ICD-10-CM | POA: Diagnosis not present

## 2016-10-25 DIAGNOSIS — E041 Nontoxic single thyroid nodule: Secondary | ICD-10-CM | POA: Diagnosis not present

## 2016-10-25 LAB — CBC WITH DIFFERENTIAL/PLATELET
Basophils Absolute: 0 10*3/uL (ref 0.0–0.1)
Basophils Relative: 0.7 % (ref 0.0–3.0)
EOS ABS: 0.5 10*3/uL (ref 0.0–0.7)
Eosinophils Relative: 13.1 % — ABNORMAL HIGH (ref 0.0–5.0)
HCT: 45.9 % (ref 36.0–46.0)
HEMOGLOBIN: 15.5 g/dL — AB (ref 12.0–15.0)
LYMPHS ABS: 1 10*3/uL (ref 0.7–4.0)
Lymphocytes Relative: 26.9 % (ref 12.0–46.0)
MCHC: 33.7 g/dL (ref 30.0–36.0)
MCV: 88.7 fl (ref 78.0–100.0)
MONO ABS: 0.4 10*3/uL (ref 0.1–1.0)
Monocytes Relative: 9.7 % (ref 3.0–12.0)
NEUTROS PCT: 49.6 % (ref 43.0–77.0)
Neutro Abs: 1.9 10*3/uL (ref 1.4–7.7)
Platelets: 138 10*3/uL — ABNORMAL LOW (ref 150.0–400.0)
RBC: 5.17 Mil/uL — ABNORMAL HIGH (ref 3.87–5.11)
RDW: 14.6 % (ref 11.5–15.5)
WBC: 3.8 10*3/uL — AB (ref 4.0–10.5)

## 2016-10-25 LAB — T4, FREE: FREE T4: 0.86 ng/dL (ref 0.60–1.60)

## 2016-10-25 LAB — COMPREHENSIVE METABOLIC PANEL
ALBUMIN: 4 g/dL (ref 3.5–5.2)
ALK PHOS: 96 U/L (ref 39–117)
ALT: 14 U/L (ref 0–35)
AST: 28 U/L (ref 0–37)
BUN: 11 mg/dL (ref 6–23)
CO2: 35 mEq/L — ABNORMAL HIGH (ref 19–32)
Calcium: 9.3 mg/dL (ref 8.4–10.5)
Chloride: 100 mEq/L (ref 96–112)
Creatinine, Ser: 0.67 mg/dL (ref 0.40–1.20)
GFR: 96.15 mL/min (ref >60.00)
Glucose, Bld: 99 mg/dL (ref 70–99)
Potassium: 4.1 mEq/L (ref 3.5–5.1)
SODIUM: 139 meq/L (ref 135–145)
Total Bilirubin: 0.3 mg/dL (ref 0.2–1.2)
Total Protein: 6.6 g/dL (ref 6.0–8.3)

## 2016-10-25 LAB — T3, FREE: T3, Free: 2.8 pg/mL (ref 2.3–4.2)

## 2016-10-25 LAB — TSH: TSH: 1.07 u[IU]/mL (ref 0.35–4.50)

## 2016-10-25 MED ORDER — SERTRALINE HCL 50 MG PO TABS: 50.0000 mg | ORAL_TABLET | Freq: Every day | ORAL | 3 refills | Status: DC

## 2016-10-25 NOTE — Progress Notes (Signed)
Subjective:    Patient ID: Christine Serrano, female    DOB: 1960-09-30, 56 y.o.   MRN: [id number]  HPI    56 year old female presents to establish care.. Transfer from [facility].  COPD,  Severe: moderate control on spiriva, symbicort, followed by pulmonary Dr. Leonidas Romberg. Duoneb prn wheeze.  she is on chronic oxygen at home.. Did not wear today.  Current smoker; trial of chantix.. Plans trial smoking cessation soon.   02/2016.Marland Kitchen Has incidental finding on CT of spiculated lung nodule.. Over due for 3 month follow up.  Depression, major moderate, recurrent: Ongoing sine 2016, after fianace killed himself,  She has had 10 additional losses in  friends, dog, horse, family in last year. Including fiance and father.  In last 3-4 days she has felt more tired.Marland Kitchen Has not had truck working.Marland Kitchen Has been more isolated. More anhedonia.  PHQ9 16 Currently using venlafaxine. Inadequate control, still crys daily. Valium using  Every few months. Using cyprohepatadine 1/2 in Am and 1/2 at night    Chronic insomnia: Using trazodone for sleep. 1/2 tab daily.   Chronic idiopathic constipation:  Fairly good control on linzess in AM and amitiza daily at night. She has never had colonoscopy in past.    She has elevated heart rate for last year. Not sure why  HR 114-127  not irregular.  no recent labs   Body mass index is 22.18 kg/m.   Blood pressure (!) 90/56, pulse (!) 127, temperature 97.8 F (36.6 C), temperature source Oral, height 5\' 2"  (1.575 m), weight 121 lb 4 oz (55 kg), SpO2 92 %.   Review of Systems  Constitutional: Negative for fatigue and fever.  HENT: Negative for congestion.   Eyes: Negative for pain.  Respiratory: Positive for cough, shortness of breath and wheezing.   Cardiovascular: Negative for chest pain, palpitations and leg swelling.  Gastrointestinal: Negative for abdominal pain.  Genitourinary: Negative for dysuria and vaginal bleeding.  Musculoskeletal: Negative for  back pain.  Neurological: Negative for syncope, light-headedness and headaches.  Psychiatric/Behavioral: Positive for dysphoric mood and sleep disturbance. Negative for suicidal ideas. The patient is nervous/anxious.        Objective:   Physical Exam  Constitutional: Vital signs are normal. She appears well-developed and well-nourished. She is cooperative.  Non-toxic appearance. She does not appear ill. No distress.  Elderly thin female in NAD  HENT:  Head: Normocephalic.  Right Ear: Hearing, tympanic membrane, external ear and ear canal normal.  Left Ear: Hearing, tympanic membrane, external ear and ear canal normal.  Nose: Nose normal.  Eyes: Conjunctivae, EOM and lids are normal. Pupils are equal, round, and reactive to light. Lids are everted and swept, no foreign bodies found.  Neck: Trachea normal and normal range of motion. Neck supple. Carotid bruit is not present. No thyroid mass and no thyromegaly present.  Cardiovascular: Regular rhythm, S1 normal, S2 normal, normal heart sounds and intact distal pulses.  Tachycardia present.  Exam reveals no gallop.   No murmur heard. Pulmonary/Chest: Effort normal and breath sounds normal. No respiratory distress. She has no wheezes. She has no rhonchi. She has no rales.  Abdominal: Soft. Normal appearance and bowel sounds are normal. She exhibits no distension, no fluid wave, no abdominal bruit and no mass. There is no hepatosplenomegaly. There is no tenderness. There is no rebound, no guarding and no CVA tenderness. No hernia.  Lymphadenopathy:    She has no cervical adenopathy.    She has no  axillary adenopathy.  Neurological: She is alert. She has normal strength. No cranial nerve deficit or sensory deficit.  Skin: Skin is warm, dry and intact. No rash noted.  Psychiatric: Her speech is normal and behavior is normal. Judgment normal. Her mood appears not anxious. Cognition and memory are normal. She does not exhibit a depressed mood.           Assessment & Plan:  Reviewed EKG: Reviewed. No changes from previous 2017 pulmonary changes presents and sinus tachy, no ST, no Q wave.

## 2016-10-25 NOTE — Assessment & Plan Note (Signed)
She has not has any liquor in 1 year.

## 2016-10-25 NOTE — Assessment & Plan Note (Addendum)
Inadequate control  On effexor 150 mg in last 2 years.  Has never tried others in past.  Rarely using valium.  Stop effexor and change to sertraline 50 mg daily.

## 2016-10-25 NOTE — Assessment & Plan Note (Signed)
Overdue for follow up spiculated lesion.

## 2016-10-25 NOTE — Patient Instructions (Addendum)
Stop valium. Stop effexor. Start sertraline 50 mg at bedtime. Continue trazodone at bedtime for sleep. Okay to continue cyprohepatadine for now.  Please stop at the front desk to set up referral.  Please stop at the lab to set up to have labs drawn.

## 2016-10-25 NOTE — Assessment & Plan Note (Signed)
Incidental finding.. Needs [place]  To eval further.

## 2016-10-25 NOTE — Assessment & Plan Note (Signed)
Usually well controlled on 1/2 tab daily.

## 2016-10-25 NOTE — Progress Notes (Signed)
Pre visit review using our clinic review tool, if applicable. No additional management support is needed unless otherwise documented below in the visit note. 

## 2016-11-01 DIAGNOSIS — R000 Tachycardia, unspecified: Secondary | ICD-10-CM | POA: Insufficient documentation

## 2016-11-01 NOTE — Assessment & Plan Note (Signed)
Appears chronic.Marland Kitchen likely due to chronic hypoxia not wearing oxygen. Eval with labs to rule out other issues.

## 2016-11-01 NOTE — Assessment & Plan Note (Signed)
Moderate control on spiriva and Symbicort. Followed by Pulmonary.  Recommended daily oxygen as chronic respiratory failure, compensated likely contributing to her tachycardia

## 2016-11-04 ENCOUNTER — Ambulatory Visit: Admission: RE | Admit: 2016-11-04 | Payer: Medicare HMO | Source: Ambulatory Visit

## 2016-11-04 ENCOUNTER — Ambulatory Visit: Payer: Medicare HMO

## 2016-11-05 ENCOUNTER — Encounter: Payer: Self-pay | Admitting: Respiratory Therapy

## 2016-11-05 DIAGNOSIS — J449 Chronic obstructive pulmonary disease, unspecified: Secondary | ICD-10-CM

## 2016-11-05 NOTE — Progress Notes (Signed)
Discharge Summary  Patient Details  Name: Christine Serrano MRN: [id number] Date of Birth: 07-Nov-1960 Referring Provider:     Pulmonary Rehab from 06/11/2016 in [facility]  Referring Provider  Merton Border MD       Number of Visits: 2 Reason for Discharge:  Early Exit:  Ms Bass's Medical Evaluation for [facility] was 06/11/16. She has attended one time on 08/07/16 and has stated during check-up calls, she would be returning. Ms Hinojosa is discharged from [facility].  Smoking History:  History  Smoking Status   Current Every Day Smoker   Packs/day: 3.00   Years: 47.00   Types: Cigarettes  Smokeless Tobacco   Never Used    Diagnosis:  COPD, severe ([place])  ADL UCSD:     Pulmonary Assessment Scores    Row Name 06/11/16 1447         ADL UCSD   ADL Phase Entry     SOB Score total 75     Rest 2     Walk 4     Stairs 5     Bath 3     Dress 3     Shop 5        Initial Exercise Prescription:     Initial Exercise Prescription - 06/11/16 1400      Date of Initial Exercise RX and Referring Provider   Date 06/11/16   Referring Provider Merton Border MD     Oxygen   Oxygen Continuous   Liters 3     Treadmill   MPH 1.5   Grade 0   Minutes 15  3 min x5   METs 2.15     NuStep   Level 2   SPM --  60-80 spm   Minutes 15   METs 2     REL-XR   Level 1   Watts --  speed 40-50   Minutes 15   METs 2     Prescription Details   Frequency (times per week) 3   Duration Progress to 45 minutes of aerobic exercise without signs/symptoms of physical distress     Intensity   THRR 40-80% of Max Heartrate 136-154   Ratings of Perceived Exertion 11-15   Perceived Dyspnea 0-4     Progression   Progression Continue to progress workloads to maintain intensity without signs/symptoms of physical distress.     Resistance Training   Training Prescription Yes   Weight 2 lbs   Reps 10-15      Discharge Exercise Prescription (Final  Exercise Prescription Changes):     Exercise Prescription Changes - 08/07/16 1200      Response to Exercise   Blood Pressure (Admit) 130/72   Blood Pressure (Exercise) 124/74   Blood Pressure (Exit) 126/72   Heart Rate (Admit) 123 bpm   Heart Rate (Exercise) 155 bpm   Heart Rate (Exit) 83 bpm   Oxygen Saturation (Admit) 93 %   Oxygen Saturation (Exercise) 90 %   Oxygen Saturation (Exit) 97 %   Rating of Perceived Exertion (Exercise) 11   Perceived Dyspnea (Exercise) 2   Duration Progress to 45 minutes of aerobic exercise without signs/symptoms of physical distress   Intensity THRR unchanged     Progression   Progression Continue to progress workloads to maintain intensity without signs/symptoms of physical distress.     Resistance Training   Training Prescription Yes   Weight 2   Reps 10-15     Interval Training   Interval  Training No     Oxygen   Oxygen Continuous   Liters 3     Bike   Level 1  AD   Minutes 8     NuStep   Level 2   Minutes 16   METs 2.1      Functional Capacity:     6 Minute Walk    Row Name 06/11/16 1447         6 Minute Walk   Phase Initial     Distance 920 feet     Walk Time 5.3 minutes     # of Rest Breaks 2  14 sec, 28 sec     MPH 1.97     METS 3.75     RPE 15     Perceived Dyspnea  7     VO2 Peak 13.12     Symptoms Yes (comment)     Comments fatigue and SOB     Resting HR 118 bpm     Resting BP 126/56     Max Ex. HR 146 bpm     Max Ex. BP 128/64     2 Minute Post BP 110/60       Interval HR   Baseline HR 118     1 Minute HR 124     2 Minute HR 139     3 Minute HR 143     4 Minute HR 136     5 Minute HR 144     6 Minute HR 146     2 Minute Post HR 122     Interval Heart Rate? Yes       Interval Oxygen   Interval Oxygen? Yes     Baseline Oxygen Saturation % 94 %  95% intermitten on her concentrater     Baseline Liters of Oxygen 2 L     1 Minute Oxygen Saturation % 88 %  stopped at 1:27 82%     1 Minute  Liters of Oxygen 2 L     2 Minute Oxygen Saturation % 89 %     2 Minute Liters of Oxygen 2 L     3 Minute Oxygen Saturation % 90 %  stopped for rest at 3:13 86%     3 Minute Liters of Oxygen 2 L     4 Minute Oxygen Saturation % 86 %     4 Minute Liters of Oxygen 2 L     5 Minute Oxygen Saturation % 88 %     5 Minute Liters of Oxygen 2 L     6 Minute Oxygen Saturation % 88 %     6 Minute Liters of Oxygen 2 L     2 Minute Post Oxygen Saturation % 92 %     2 Minute Post Liters of Oxygen 2 L        Psychological, QOL, Others - Outcomes: PHQ 2/9: Depression screen [facility] 2/9 10/25/2016 06/11/2016  Decreased Interest 3 2  Down, Depressed, Hopeless 3 1  PHQ - 2 Score 6 3  Altered sleeping 2 1  Tired, decreased energy 3 3  Change in appetite 2 0  Feeling bad or failure about yourself  2 1  Trouble concentrating 1 0  Moving slowly or fidgety/restless 0 0  Suicidal thoughts 0 0  PHQ-9 Score 16 8  Difficult doing work/chores - Very difficult    Quality of Life:     Quality of Life - 06/11/16 1502  Quality of Life Scores   Health/Function Pre 20.63 %   Socioeconomic Pre 19.86 %   Psych/Spiritual Pre 20.57 %   Family Pre 20.2 %   GLOBAL Pre 20.4 %      Personal Goals: Goals established at orientation with interventions provided to work toward goal.     Personal Goals and Risk Factors at Admission - 06/11/16 1452      Core Components/Risk Factors/Patient Goals on Admission    Weight Management Yes;Weight Maintenance;Weight Gain   Intervention Weight Management: Develop a combined nutrition and exercise program designed to reach desired caloric intake, while maintaining appropriate intake of nutrient and fiber, sodium and fats, and appropriate energy expenditure required for the weight goal.;Weight Management: Provide education and appropriate resources to help participant work on and attain dietary goals.   Admit Weight 117 lb 9.6 oz (53.3 kg)   Goal Weight: Short Term 120  lb (54.4 kg)   Goal Weight: Long Term 122 lb (55.3 kg)   Expected Outcomes Weight Gain: Understanding of general recommendations for a high calorie, high protein meal plan that promotes weight gain by distributing calorie intake throughout the day with the consumption for 4-5 meals, snacks, and/or supplements;Short Term: Continue to assess and modify interventions until short term weight is achieved;Long Term: Adherence to nutrition and physical activity/exercise program aimed toward attainment of established weight goal   Sedentary Yes   Intervention Provide advice, education, support and counseling about physical activity/exercise needs.;Develop an individualized exercise prescription for aerobic and resistive training based on initial evaluation findings, risk stratification, comorbidities and participant's personal goals.   Expected Outcomes Achievement of increased cardiorespiratory fitness and enhanced flexibility, muscular endurance and strength shown through measurements of functional capacity and personal statement of participant.   Increase Strength and Stamina Yes   Intervention Provide advice, education, support and counseling about physical activity/exercise needs.;Develop an individualized exercise prescription for aerobic and resistive training based on initial evaluation findings, risk stratification, comorbidities and participant's personal goals.   Expected Outcomes Achievement of increased cardiorespiratory fitness and enhanced flexibility, muscular endurance and strength shown through measurements of functional capacity and personal statement of participant.   Improve shortness of breath with ADL's Yes   Intervention Provide education, individualized exercise plan and daily activity instruction to help decrease symptoms of SOB with activities of daily living.   Expected Outcomes Short Term: Achieves a reduction of symptoms when performing activities of daily living.   Develop more  efficient breathing techniques such as purse lipped breathing and diaphragmatic breathing; and practicing self-pacing with activity Yes   Intervention Provide education, demonstration and support about specific breathing techniuqes utilized for more efficient breathing. Include techniques such as pursed lipped breathing, diaphragmatic breathing and self-pacing activity.   Expected Outcomes Short Term: Participant will be able to demonstrate and use breathing techniques as needed throughout daily activities.   Increase knowledge of respiratory medications and ability to use respiratory devices properly  Yes   Intervention Provide education and demonstration as needed of appropriate use of medications, inhalers, and oxygen therapy.  Ventolin, Symbicort, Spiriva MDI's; Duoneb; Oxygen 2l/m   Expected Outcomes Short Term: Achieves understanding of medications use. Understands that oxygen is a medication prescribed by physician. Demonstrates appropriate use of inhaler and oxygen therapy.       Personal Goals Discharge:     Goals and Risk Factor Review    Row Name 08/21/16 1552             Core Components/Risk Factors/Patient Goals  Review   Personal Goals Review Develop more efficient breathing techniques such as purse lipped breathing and diaphragmatic breathing and practicing self-pacing with activity.       Review Ms Marken has attended one session since her 06/11/16 evaluation. She attended 08/07/16 and accomplished her exercise goals on 2l/m oxygen. She was reminded to use PLB breathing with her exercise .  Currently she is driving her roommate to work in [employer], and it does affect her attendance. He roommate is getting her drivers license soon.        Expected Outcomes Continue to attend [facility] regularly          Nutrition & Weight - Outcomes:     Pre Biometrics - 06/11/16 1503      Pre Biometrics   Height 5' 4.9" (1.648 m)   Weight 117 lb 9.6 oz (53.3 kg)   Waist  Circumference 32.5 inches   Hip Circumference 35 inches   Waist to Hip Ratio 0.93 %   BMI (Calculated) 19.7       Nutrition:   Nutrition Discharge:   Education Questionnaire Score:     Knowledge Questionnaire Score - 06/11/16 1445      Knowledge Questionnaire Score   Pre Score 7/10      Goals reviewed with patient; copy given to patient.

## 2016-11-05 NOTE — Progress Notes (Signed)
Pulmonary Individual Treatment Plan  Patient Details  Name: Christine Serrano MRN: [id number] Date of Birth: 04-24-1961 Referring Provider:     Pulmonary Rehab from 06/11/2016 in [facility] Cardiac and Pulmonary Rehab  Referring Provider  Merton Border MD      Initial Encounter Date:    Pulmonary Rehab from 06/11/2016 in [facility] Cardiac and Pulmonary Rehab  Date  06/11/16  Referring Provider  Merton Border MD      Visit Diagnosis: COPD, severe ([place])  Patient's Home Medications on Admission:  Current Outpatient Prescriptions:    AMITIZA 24 MCG capsule, , Disp: , Rfl:    aspirin EC 81 MG tablet, Take 81 mg by mouth daily., Disp: , Rfl:    Cholecalciferol (VITAMIN D) 2000 units tablet, Take 2,000 Units by mouth daily., Disp: , Rfl:    cyproheptadine (PERIACTIN) 4 MG tablet, Take 4 mg by mouth 2 (two) times daily., Disp: , Rfl:    ipratropium-albuterol (DUONEB) 0.5-2.5 (3) MG/3ML SOLN, Inhale 3 mLs into the lungs every 6 (six) hours as needed., Disp: 360 mL, Rfl: 3   LINZESS 290 MCG CAPS capsule, Take 290 mcg by mouth daily., Disp: , Rfl:    Multiple Vitamins-Minerals (MULTIVITAMIN ADULT) TABS, Take 1 tablet by mouth daily., Disp: , Rfl:    sertraline (ZOLOFT) 50 MG tablet, Take 1 tablet (50 mg total) by mouth daily., Disp: 30 tablet, Rfl: 3   SYMBICORT 160-4.5 MCG/ACT inhaler, Inhale 2 puffs into the lungs 2 (two) times daily., Disp: 3 Inhaler, Rfl: 4   tiotropium (SPIRIVA) 18 MCG inhalation capsule, Place 1 capsule (18 mcg total) into inhaler and inhale daily., Disp: 90 capsule, Rfl: 4   traZODone (DESYREL) 50 MG tablet, Take 100 mg by mouth at bedtime., Disp: , Rfl:    varenicline (CHANTIX CONTINUING MONTH PAK) 1 MG tablet, Take 1 tablet (1 mg total) by mouth 2 (two) times daily., Disp: 60 tablet, Rfl: 5   varenicline (CHANTIX STARTING MONTH PAK) 0.5 MG X 11 & 1 MG X 42 tablet, Take one 0.5 mg tablet by mouth once daily for 3 days, then increase to one 0.5 mg tablet twice daily  for 4 days, then increase to one 1 mg tablet twice daily., Disp: 53 tablet, Rfl: 0   VENTOLIN HFA 108 (90 Base) MCG/ACT inhaler, Inhale 2 puffs into the lungs every 6 (six) hours as needed for shortness of breath., Disp: 6 Inhaler, Rfl: 4   vitamin B-12 (CYANOCOBALAMIN) 1000 MCG tablet, Take 1,000 mcg by mouth daily., Disp: , Rfl:    vitamin E 400 UNIT capsule, Take 400 Units by mouth daily. , Disp: , Rfl:   Past Medical History: Past Medical History:  Diagnosis Date   Chronic idiopathic constipation    COPD (chronic obstructive pulmonary disease) (HCC)    Depression    Endometriosis     Tobacco Use: History  Smoking Status   Current Every Day Smoker   Packs/day: 3.00   Years: 47.00   Types: Cigarettes  Smokeless Tobacco   Never Used    Labs: Recent Review Scientist, physiological    Labs for ITP Cardiac and Pulmonary Rehab Latest Ref Rng & Units 04/04/2009 04/09/2016   PHART 7.350 - 7.450 7.302(L) 7.30(L)   PCO2ART 32.0 - 48.0 mmHg 59.5 CRITICAL RESULT CALLED TO, READ BACK BY AND VERIFIED WITH: [facility] 04/04/09 AT 1440 CRITICAL RESULT CALLED TO, READ BACK BY AND VERIFIED WITH: [facility] 04/04/09 AT 1440 BY BROADNAX,L.RRT(HH) 65(H)   HCO3 20.0 - 28.0 mmol/L 28.5(H) 32.0(H)  TCO2 0 - 100 mmol/L 25.9 -   O2SAT % 95.5 88.7       ADL UCSD:     Pulmonary Assessment Scores    Row Name 06/11/16 1447         ADL UCSD   ADL Phase Entry     SOB Score total 75     Rest 2     Walk 4     Stairs 5     Bath 3     Dress 3     Shop 5        Pulmonary Function Assessment:     Pulmonary Function Assessment - 06/11/16 1445      Initial Spirometry Results   FVC% 31 %   FEV1% 27 %   FEV1/FVC Ratio 68.82   Comments Test Date 06/11/16     Post Bronchodilator Spirometry Results   FVC% 51 %   FEV1% 36 %   FEV1/FVC Ratio 59.71     Breath   Bilateral Breath Sounds Decreased;Expiratory;Wheezes   Shortness of Breath Yes;Limiting activity      Exercise Target  Goals:    Exercise Program Goal: Individual exercise prescription set with THRR, safety & activity barriers. Participant demonstrates ability to understand and report RPE using BORG scale, to self-measure pulse accurately, and to acknowledge the importance of the exercise prescription.  Exercise Prescription Goal: Starting with aerobic activity 30 plus minutes a day, 3 days per week for initial exercise prescription. Provide home exercise prescription and guidelines that participant acknowledges understanding prior to discharge.  Activity Barriers & Risk Stratification:     Activity Barriers & Cardiac Risk Stratification - 06/11/16 1445      Activity Barriers & Cardiac Risk Stratification   Activity Barriers Shortness of Breath;Deconditioning;Muscular Weakness   Cardiac Risk Stratification Moderate      6 Minute Walk:     6 Minute Walk    Row Name 06/11/16 1447         6 Minute Walk   Phase Initial     Distance 920 feet     Walk Time 5.3 minutes     # of Rest Breaks 2  14 sec, 28 sec     MPH 1.97     METS 3.75     RPE 15     Perceived Dyspnea  7     VO2 Peak 13.12     Symptoms Yes (comment)     Comments fatigue and SOB     Resting HR 118 bpm     Resting BP 126/56     Max Ex. HR 146 bpm     Max Ex. BP 128/64     2 Minute Post BP 110/60       Interval HR   Baseline HR 118     1 Minute HR 124     2 Minute HR 139     3 Minute HR 143     4 Minute HR 136     5 Minute HR 144     6 Minute HR 146     2 Minute Post HR 122     Interval Heart Rate? Yes       Interval Oxygen   Interval Oxygen? Yes     Baseline Oxygen Saturation % 94 %  95% intermitten on her concentrater     Baseline Liters of Oxygen 2 L     1 Minute Oxygen Saturation % 88 %  stopped at 1:27 82%  1 Minute Liters of Oxygen 2 L     2 Minute Oxygen Saturation % 89 %     2 Minute Liters of Oxygen 2 L     3 Minute Oxygen Saturation % 90 %  stopped for rest at 3:13 86%     3 Minute Liters of  Oxygen 2 L     4 Minute Oxygen Saturation % 86 %     4 Minute Liters of Oxygen 2 L     5 Minute Oxygen Saturation % 88 %     5 Minute Liters of Oxygen 2 L     6 Minute Oxygen Saturation % 88 %     6 Minute Liters of Oxygen 2 L     2 Minute Post Oxygen Saturation % 92 %     2 Minute Post Liters of Oxygen 2 L       Oxygen Initial Assessment:     Oxygen Initial Assessment - 08/26/16 0834      Home Oxygen   Home Oxygen Device Portable Concentrator;Home Concentrator   Sleep Oxygen Prescription Continuous   Liters per minute 2   Home Exercise Oxygen Prescription Continuous   Liters per minute 2   Home at Rest Exercise Oxygen Prescription Continuous   Liters per minute 2     Program Oxygen Prescription   Program Oxygen Prescription Continuous;E-Tanks   Liters per minute 2      Oxygen Re-Evaluation:   Oxygen Discharge (Final Oxygen Re-Evaluation):   Initial Exercise Prescription:     Initial Exercise Prescription - 06/11/16 1400      Date of Initial Exercise RX and Referring Provider   Date 06/11/16   Referring Provider Merton Border MD     Oxygen   Oxygen Continuous   Liters 3     Treadmill   MPH 1.5   Grade 0   Minutes 15  3 min x5   METs 2.15     NuStep   Level 2   SPM --  60-80 spm   Minutes 15   METs 2     REL-XR   Level 1   Watts --  speed 40-50   Minutes 15   METs 2     Prescription Details   Frequency (times per week) 3   Duration Progress to 45 minutes of aerobic exercise without signs/symptoms of physical distress     Intensity   THRR 40-80% of Max Heartrate 136-154   Ratings of Perceived Exertion 11-15   Perceived Dyspnea 0-4     Progression   Progression Continue to progress workloads to maintain intensity without signs/symptoms of physical distress.     Resistance Training   Training Prescription Yes   Weight 2 lbs   Reps 10-15      Perform Capillary Blood Glucose checks as needed.  Exercise Prescription Changes:      Exercise Prescription Changes    Row Name 06/11/16 1400 08/07/16 1200           Response to Exercise   Blood Pressure (Admit) 126/56 130/72      Blood Pressure (Exercise) 128/64 124/74      Blood Pressure (Exit) 110/60 126/72      Heart Rate (Admit) 118 bpm 123 bpm      Heart Rate (Exercise) 146 bpm 155 bpm      Heart Rate (Exit) 122 bpm 83 bpm      Oxygen Saturation (Admit) 94 % 93 %  Oxygen Saturation (Exercise) 82 % 90 %      Oxygen Saturation (Exit) 92 % 97 %      Rating of Perceived Exertion (Exercise) 15 11      Perceived Dyspnea (Exercise) 7 2      Symptoms fatigue and SOB  --      Duration  -- Progress to 45 minutes of aerobic exercise without signs/symptoms of physical distress      Intensity  -- THRR unchanged        Progression   Progression  -- Continue to progress workloads to maintain intensity without signs/symptoms of physical distress.        Resistance Training   Training Prescription  -- Yes      Weight  -- 2      Reps  -- 10-15        Interval Training   Interval Training  -- No        Oxygen   Oxygen  -- Continuous      Liters  -- 3        Bike   Level  -- 1  AD      Minutes  -- 8        NuStep   Level  -- 2      Minutes  -- 16      METs  -- 2.1        Exercise Review   Progression --  walk test results  --         Exercise Comments:     Exercise Comments    Row Name 06/11/16 1503 08/07/16 1229         Exercise Comments Krystalyn wants to be able to go out and walk and to able to play with her dog. First full day of exercise!  Patient was oriented to gym and equipment including functions, settings, policies, and procedures.  Patient's individual exercise prescription and treatment plan were reviewed.  All starting workloads were established based on the results of the 6 minute walk test done at initial orientation visit.  The plan for exercise progression was also introduced and progression will be customized based on patient's  performance and goals.         Exercise Goals and Review:   Exercise Goals Re-Evaluation :   Discharge Exercise Prescription (Final Exercise Prescription Changes):     Exercise Prescription Changes - 08/07/16 1200      Response to Exercise   Blood Pressure (Admit) 130/72   Blood Pressure (Exercise) 124/74   Blood Pressure (Exit) 126/72   Heart Rate (Admit) 123 bpm   Heart Rate (Exercise) 155 bpm   Heart Rate (Exit) 83 bpm   Oxygen Saturation (Admit) 93 %   Oxygen Saturation (Exercise) 90 %   Oxygen Saturation (Exit) 97 %   Rating of Perceived Exertion (Exercise) 11   Perceived Dyspnea (Exercise) 2   Duration Progress to 45 minutes of aerobic exercise without signs/symptoms of physical distress   Intensity THRR unchanged     Progression   Progression Continue to progress workloads to maintain intensity without signs/symptoms of physical distress.     Resistance Training   Training Prescription Yes   Weight 2   Reps 10-15     Interval Training   Interval Training No     Oxygen   Oxygen Continuous   Liters 3     Bike   Level 1  AD   Minutes 8  NuStep   Level 2   Minutes 16   METs 2.1      Nutrition:  Target Goals: Understanding of nutrition guidelines, daily intake of sodium '1500mg'$ , cholesterol '200mg'$ , calories 30% from fat and 7% or less from saturated fats, daily to have 5 or more servings of fruits and vegetables.  Biometrics:     Pre Biometrics - 06/11/16 1503      Pre Biometrics   Height 5' 4.9" (1.648 m)   Weight 117 lb 9.6 oz (53.3 kg)   Waist Circumference 32.5 inches   Hip Circumference 35 inches   Waist to Hip Ratio 0.93 %   BMI (Calculated) 19.7       Nutrition Therapy Plan and Nutrition Goals:   Nutrition Discharge: Rate Your Plate Scores:   Nutrition Goals Re-Evaluation:   Nutrition Goals Discharge (Final Nutrition Goals Re-Evaluation):   Psychosocial: Target Goals: Acknowledge presence or absence of significant  depression and/or stress, maximize coping skills, provide positive support system. Participant is able to verbalize types and ability to use techniques and skills needed for reducing stress and depression.   Initial Review & Psychosocial Screening:     Initial Psych Review & Screening - 06/11/16 Clarissa? Yes   Comments Ms Tuite has good wupport from her sister-in-law who lives with Ms Tarver. Ms Gordan has had much loss in her life this past year - her boyfriend of 65 years, father-in-law , a few other relatives, and her horse of 18 years, plus her dog of 18 years. She will benefit from meeting with our Health Counselor, Lucianne Lei and participation in [place].     Barriers   Psychosocial barriers to participate in program The patient should benefit from training in stress management and relaxation.     Screening Interventions   Interventions Program counselor consult;Encouraged to exercise      Quality of Life Scores:     Quality of Life - 06/11/16 1502      Quality of Life Scores   Health/Function Pre 20.63 %   Socioeconomic Pre 19.86 %   Psych/Spiritual Pre 20.57 %   Family Pre 20.2 %   GLOBAL Pre 20.4 %      PHQ-9: Recent Review Flowsheet Data    Depression screen [facility] 2/9 10/25/2016 06/11/2016   Decreased Interest 3 2   Down, Depressed, Hopeless 3 1   PHQ - 2 Score 6 3   Altered sleeping 2  1   Tired, decreased energy 3 3   Change in appetite 2  0   Feeling bad or failure about yourself  2 1   Trouble concentrating 1 0   Moving slowly or fidgety/restless 0 0   Suicidal thoughts 0 0   PHQ-9 Score 16 8   Difficult doing work/chores - Very difficult     Interpretation of Total Score  Total Score Depression Severity:  1-4 = Minimal depression, 5-9 = Mild depression, 10-14 = Moderate depression, 15-19 = Moderately severe depression, 20-27 = Severe depression   Psychosocial Evaluation and Intervention:      Psychosocial Evaluation - 08/07/16 1110      Psychosocial Evaluation & Interventions   Interventions Relaxation education;Stress management education;Encouraged to exercise with the program and follow exercise prescription   Comments Counselor met with Ms. Allebach today for initial psychosocial evaluation.  She is a 56 yr old who has been diagnosed with COPD and is on Oxygen 24/7 now.  She has a significant other she has known most of her life who has been living with her for 8 months now.  Anaika reports a history of chronic sleeping problems with possibly 2/7 nights getting 6 hours of sleep.  The remainder of the time it is typical for 3-4 hours of sleep.  She states her appetite is up and down lately.  She reports a history of depression subsequent to multiple losses over the past several years, including a fiance who committed suicide and a father figure in the same year; as well as her dog of 18 years and a horse she had for 18 years that she had to find a new home for recently.  Bobbye states her sleep and mood issues have been worse since all of this occurred beginning in June of 2016.  She reports being on medications for depression and anxiety and states it is managing the symptoms "okay."  Her primary stressors are her health and her struggle to quit smoking.  Counselor suggested a graduated reduction plan of one less cigarette per day over a period of time to cut back on the reported pack and a half each day.  She agreed to give this a try.  She is on Chantix for smoking cessation, but reports this is not helpful.  Her goals for this program are to get healthier and to hopefully quit smoking after almost 47 years ("since the age of 17").  Counselor recommended a Grief Share group for support with the multiple significant losses over such a short period of time.  Counselor provided local group contact information with times.  Also, counselor mentioned Lillionna may need to speak with her Dr.  about a  sleep study since her problems have been chronic and the medications prescribed are reportedly not being very effective.  Counselor and staff will be following with Zhara throughout the course of this program.   Continue Psychosocial Services  Yes  Follow re: sleep cessation and graduated plan; as well as mood and sleep if not improved with consistent exercise.  Client was also given info on Grief support groups locally.      Psychosocial Re-Evaluation:     Psychosocial Re-Evaluation    [place] Name 08/09/16 1052             Psychosocial Re-Evaluation   Comments Angeletta called to say she was sorry that she overslept today and she would not be able to attend the 11:30am Pulm Rehab class today either. Chelsae reported that the first Pulmonary Rehab class helped her and she has cut back on her smoking because of that first [facility] Rehab session.        Interventions Encouraged to attend Pulmonary Rehabilitation for the exercise          Psychosocial Discharge (Final Psychosocial Re-Evaluation):     Psychosocial Re-Evaluation - 08/09/16 1052      Psychosocial Re-Evaluation   Comments Deziray called to say she was sorry that she overslept today and she would not be able to attend the 11:30am Pulm Rehab class today either. Lorella reported that the first Pulmonary Rehab class helped her and she has cut back on her smoking because of that first [facility] Rehab session.    Interventions Encouraged to attend Pulmonary Rehabilitation for the exercise      Education: Education Goals: Education classes will be provided on a weekly basis, covering required topics. Participant will state understanding/return demonstration of topics presented.  Learning Barriers/Preferences:  Learning Barriers/Preferences - 06/11/16 1445      Learning Barriers/Preferences   Learning Barriers None   Learning Preferences None      Education Topics: Initial Evaluation Education: - Verbal, written and demonstration of  respiratory meds, RPE/PD scales, oximetry and breathing techniques. Instruction on use of nebulizers and MDIs: cleaning and proper use, rinsing mouth with steroid doses and importance of monitoring MDI activations.   Pulmonary Rehab from 08/07/2016 in [facility] Cardiac and Pulmonary Rehab  Date  06/11/16  Educator  LB  Instruction Review Code  2- meets goals/outcomes      General Nutrition Guidelines/Fats and Fiber: -Group instruction provided by verbal, written material, models and posters to present the general guidelines for heart healthy nutrition. Gives an explanation and review of dietary fats and fiber.   Controlling Sodium/Reading Food Labels: -Group verbal and written material supporting the discussion of sodium use in heart healthy nutrition. Review and explanation with models, verbal and written materials for utilization of the food label.   Exercise Physiology & Risk Factors: - Group verbal and written instruction with models to review the exercise physiology of the cardiovascular system and associated critical values. Details cardiovascular disease risk factors and the goals associated with each risk factor.   Aerobic Exercise & Resistance Training: - Gives group verbal and written discussion on the health impact of inactivity. On the components of aerobic and resistive training programs and the benefits of this training and how to safely progress through these programs.   Flexibility, Balance, General Exercise Guidelines: - Provides group verbal and written instruction on the benefits of flexibility and balance training programs. Provides general exercise guidelines with specific guidelines to those with heart or lung disease. Demonstration and skill practice provided.   Stress Management: - Provides group verbal and written instruction about the health risks of elevated stress, cause of high stress, and healthy ways to reduce stress.   Depression: - Provides group verbal and  written instruction on the correlation between heart/lung disease and depressed mood, treatment options, and the stigmas associated with seeking treatment.   Exercise & Equipment Safety: - Individual verbal instruction and demonstration of equipment use and safety with use of the equipment.   Pulmonary Rehab from 08/07/2016 in [facility] Cardiac and Pulmonary Rehab  Date  08/07/16  Educator  [facility]  Instruction Review Code  2- meets goals/outcomes      Infection Prevention: - Provides verbal and written material to individual with discussion of infection control including proper hand washing and proper equipment cleaning during exercise session.   Pulmonary Rehab from 08/07/2016 in [facility] Cardiac and Pulmonary Rehab  Date  08/07/16  Educator  [facility]  Instruction Review Code  2- meets goals/outcomes      Falls Prevention: - Provides verbal and written material to individual with discussion of falls prevention and safety.   Pulmonary Rehab from 08/07/2016 in [facility] Cardiac and Pulmonary Rehab  Date  06/11/16  Educator  LB  Instruction Review Code  2- meets goals/outcomes      Diabetes: - Individual verbal and written instruction to review signs/symptoms of diabetes, desired ranges of glucose level fasting, after meals and with exercise. Advice that pre and post exercise glucose checks will be done for 3 sessions at entry of program.   Chronic Lung Diseases: - Group verbal and written instruction to review new updates, new respiratory medications, new advancements in procedures and treatments. Provide informative websites and "800" numbers of self-education.   Lung Procedures: - Group verbal and  written instruction to describe testing methods done to diagnose lung disease. Review the outcome of test results. Describe the treatment choices: Pulmonary Function Tests, ABGs and oximetry.   Energy Conservation: - Provide group verbal and written instruction for methods to conserve energy, plan and  organize activities. Instruct on pacing techniques, use of adaptive equipment and posture/positioning to relieve shortness of breath.   Triggers: - Group verbal and written instruction to review types of environmental controls: home humidity, furnaces, filters, dust mite/pet prevention, HEPA vacuums. To discuss weather changes, air quality and the benefits of nasal washing.   Pulmonary Rehab from 08/07/2016 in [facility] Cardiac and Pulmonary Rehab  Date  08/07/16  Educator  LB  Instruction Review Code  2- meets goals/outcomes      Exacerbations: - Group verbal and written instruction to provide: warning signs, infection symptoms, calling MD promptly, preventive modes, and value of vaccinations. Review: effective airway clearance, coughing and/or vibration techniques. Create an Sports administrator.   Oxygen: - Individual and group verbal and written instruction on oxygen therapy. Includes supplement oxygen, available portable oxygen systems, continuous and intermittent flow rates, oxygen safety, concentrators, and Medicare reimbursement for oxygen.   Pulmonary Rehab from 08/07/2016 in [facility] Cardiac and Pulmonary Rehab  Date  06/11/16  Educator  LB  Instruction Review Code  2- meets goals/outcomes      Respiratory Medications: - Group verbal and written instruction to review medications for lung disease. Drug class, frequency, complications, importance of spacers, rinsing mouth after steroid MDI's, and proper cleaning methods for nebulizers.   Pulmonary Rehab from 08/07/2016 in [facility] Cardiac and Pulmonary Rehab  Date  06/11/16  Educator  LB  Instruction Review Code  2- meets goals/outcomes      AED/CPR: - Group verbal and written instruction with the use of models to demonstrate the basic use of the AED with the basic ABC's of resuscitation.   Breathing Retraining: - Provides individuals verbal and written instruction on purpose, frequency, and proper technique of diaphragmatic breathing and  pursed-lipped breathing. Applies individual practice skills.   Pulmonary Rehab from 08/07/2016 in [facility] Cardiac and Pulmonary Rehab  Date  06/11/16  Educator  LB  Instruction Review Code  2- meets goals/outcomes      Anatomy and Physiology of the Lungs: - Group verbal and written instruction with the use of models to provide basic lung anatomy and physiology related to function, structure and complications of lung disease.   Heart Failure: - Group verbal and written instruction on the basics of heart failure: signs/symptoms, treatments, explanation of ejection fraction, enlarged heart and cardiomyopathy.   Sleep Apnea: - Individual verbal and written instruction to review Obstructive Sleep Apnea. Review of risk factors, methods for diagnosing and types of masks and machines for OSA.   Anxiety: - Provides group, verbal and written instruction on the correlation between heart/lung disease and anxiety, treatment options, and management of anxiety.   Relaxation: - Provides group, verbal and written instruction about the benefits of relaxation for patients with heart/lung disease. Also provides patients with examples of relaxation techniques.   Knowledge Questionnaire Score:     Knowledge Questionnaire Score - 06/11/16 1445      Knowledge Questionnaire Score   Pre Score 7/10       Core Components/Risk Factors/Patient Goals at Admission:     Personal Goals and Risk Factors at Admission - 06/11/16 1452      Core Components/Risk Factors/Patient Goals on Admission    Weight Management Yes;Weight Maintenance;Weight Gain  Intervention Weight Management: Develop a combined nutrition and exercise program designed to reach desired caloric intake, while maintaining appropriate intake of nutrient and fiber, sodium and fats, and appropriate energy expenditure required for the weight goal.;Weight Management: Provide education and appropriate resources to help participant work on and attain  dietary goals.   Admit Weight 117 lb 9.6 oz (53.3 kg)   Goal Weight: Short Term 120 lb (54.4 kg)   Goal Weight: Long Term 122 lb (55.3 kg)   Expected Outcomes Weight Gain: Understanding of general recommendations for a high calorie, high protein meal plan that promotes weight gain by distributing calorie intake throughout the day with the consumption for 4-5 meals, snacks, and/or supplements;Short Term: Continue to assess and modify interventions until short term weight is achieved;Long Term: Adherence to nutrition and physical activity/exercise program aimed toward attainment of established weight goal   Sedentary Yes   Intervention Provide advice, education, support and counseling about physical activity/exercise needs.;Develop an individualized exercise prescription for aerobic and resistive training based on initial evaluation findings, risk stratification, comorbidities and participant's personal goals.   Expected Outcomes Achievement of increased cardiorespiratory fitness and enhanced flexibility, muscular endurance and strength shown through measurements of functional capacity and personal statement of participant.   Increase Strength and Stamina Yes   Intervention Provide advice, education, support and counseling about physical activity/exercise needs.;Develop an individualized exercise prescription for aerobic and resistive training based on initial evaluation findings, risk stratification, comorbidities and participant's personal goals.   Expected Outcomes Achievement of increased cardiorespiratory fitness and enhanced flexibility, muscular endurance and strength shown through measurements of functional capacity and personal statement of participant.   Improve shortness of breath with ADL's Yes   Intervention Provide education, individualized exercise plan and daily activity instruction to help decrease symptoms of SOB with activities of daily living.   Expected Outcomes Short Term: Achieves a  reduction of symptoms when performing activities of daily living.   Develop more efficient breathing techniques such as purse lipped breathing and diaphragmatic breathing; and practicing self-pacing with activity Yes   Intervention Provide education, demonstration and support about specific breathing techniuqes utilized for more efficient breathing. Include techniques such as pursed lipped breathing, diaphragmatic breathing and self-pacing activity.   Expected Outcomes Short Term: Participant will be able to demonstrate and use breathing techniques as needed throughout daily activities.   Increase knowledge of respiratory medications and ability to use respiratory devices properly  Yes   Intervention Provide education and demonstration as needed of appropriate use of medications, inhalers, and oxygen therapy.  Ventolin, Symbicort, Spiriva MDI's; Duoneb; Oxygen 2l/m   Expected Outcomes Short Term: Achieves understanding of medications use. Understands that oxygen is a medication prescribed by physician. Demonstrates appropriate use of inhaler and oxygen therapy.      Core Components/Risk Factors/Patient Goals Review:      Goals and Risk Factor Review    Row Name 08/21/16 1552             Core Components/Risk Factors/Patient Goals Review   Personal Goals Review Develop more efficient breathing techniques such as purse lipped breathing and diaphragmatic breathing and practicing self-pacing with activity.       Review Ms Gorby has attended one session since her 06/11/16 evaluation. She attended 08/07/16 and accomplished her exercise goals on 2l/m oxygen. She was reminded to use PLB breathing with her exercise .  Currently she is driving her roommate to work in [facility], and it does affect her attendance. He roommate is getting  her drivers license soon.        Expected Outcomes Continue to attend [facility] regularly          Core Components/Risk Factors/Patient Goals at Discharge (Final  Review):      Goals and Risk Factor Review - 08/21/16 1552      Core Components/Risk Factors/Patient Goals Review   Personal Goals Review Develop more efficient breathing techniques such as purse lipped breathing and diaphragmatic breathing and practicing self-pacing with activity.   Review Ms Ames has attended one session since her 06/11/16 evaluation. She attended 08/07/16 and accomplished her exercise goals on 2l/m oxygen. She was reminded to use PLB breathing with her exercise .  Currently she is driving her roommate to work in [facility], and it does affect her attendance. He roommate is getting her drivers license soon.    Expected Outcomes Continue to attend [facility] regularly      ITP Comments:     ITP Comments    Row Name 06/26/16 1535 07/09/16 0729 08/09/16 1051 08/29/16 1200 09/10/16 1333   ITP Comments Called Ms Dardis who had an appointment to start [facility] on 06/24/16. She has been sick , has an appointment on 07/02/16 with her physician, and plans to start exercise in [place] as soon as she recovers from her illness. Ms Sosa called to inform [facility] that she would probably return to class 07/15/16. She is recovering from her illness and drives her roommate to work which is somewhat of a conflict. Stephinie called to say she was sorry that she overslept today and she would not be able to attend the 11:30am Pulm Rehab class today either. Zonia reported that the first Pulmonary Rehab class helped her and she has cut back on her smoking because of that first [facility] Re[facility] session.  Left message for Ivett since she has not attended [facility] since first visit. Called Ms Delosreyes - last attended 08/07/16; first visit was 06/11/16. Left a message to inquire if Ms Yerian plans to continue in [place].   [place] Name 10/08/16 1335 10/21/16 0739         ITP Comments Called Ms Kosinski - she plans to start back to [facility] 10/07/16   30 day note review by Dr Emily Filbert, Medical  Director of [facility]         Comments: Ms Alridge's Medical Evaluation for [facility] was 06/11/16. She has attended one time on 08/07/16 and has stated during check-up calls, she would be returning. Ms Dorman is discharged from [place].

## 2016-11-19 ENCOUNTER — Ambulatory Visit: Payer: Medicare HMO

## 2016-11-19 ENCOUNTER — Ambulatory Visit: Admission: RE | Admit: 2016-11-19 | Payer: Medicare HMO | Source: Ambulatory Visit

## 2016-11-19 DIAGNOSIS — J449 Chronic obstructive pulmonary disease, unspecified: Secondary | ICD-10-CM | POA: Diagnosis not present

## 2016-11-25 ENCOUNTER — Ambulatory Visit
Admission: RE | Admit: 2016-11-25 | Discharge: 2016-11-25 | Disposition: A | Discharge status: Home / Self Care | Payer: Medicare HMO | Source: Ambulatory Visit | Attending: Family Medicine | Admitting: Family Medicine

## 2016-11-25 DIAGNOSIS — I700 Atherosclerosis of aorta: Secondary | ICD-10-CM | POA: Insufficient documentation

## 2016-11-25 DIAGNOSIS — R911 Solitary pulmonary nodule: Secondary | ICD-10-CM

## 2016-11-25 DIAGNOSIS — J439 Emphysema, unspecified: Secondary | ICD-10-CM | POA: Diagnosis not present

## 2016-11-25 DIAGNOSIS — E041 Nontoxic single thyroid nodule: Secondary | ICD-10-CM | POA: Insufficient documentation

## 2016-11-25 MED ORDER — IOPAMIDOL (ISOVUE-300) INJECTION 61%
75.0000 mL | Freq: Once | INTRAVENOUS | Status: AC | PRN
  Administered 2016-11-25: 75 mL via INTRAVENOUS

## 2016-11-26 ENCOUNTER — Encounter: Payer: Self-pay | Admitting: Family Medicine

## 2016-11-26 DIAGNOSIS — I700 Atherosclerosis of aorta: Secondary | ICD-10-CM | POA: Insufficient documentation

## 2016-11-28 ENCOUNTER — Ambulatory Visit (INDEPENDENT_AMBULATORY_CARE_PROVIDER_SITE_OTHER): Payer: Medicare HMO | Admitting: Family Medicine

## 2016-11-28 ENCOUNTER — Encounter: Payer: Self-pay | Admitting: Family Medicine

## 2016-11-28 VITALS — BP 142/66 | HR 112 | Temp 98.4°F | Wt 125.5 lb

## 2016-11-28 DIAGNOSIS — J439 Emphysema, unspecified: Secondary | ICD-10-CM | POA: Diagnosis not present

## 2016-11-28 DIAGNOSIS — J441 Chronic obstructive pulmonary disease with (acute) exacerbation: Secondary | ICD-10-CM

## 2016-11-28 DIAGNOSIS — Z720 Tobacco use: Secondary | ICD-10-CM | POA: Insufficient documentation

## 2016-11-28 MED ORDER — PREDNISONE 20 MG PO TABS
ORAL_TABLET | ORAL | 0 refills | Status: DC
Start: 2016-11-28 — End: 2016-12-10

## 2016-11-28 MED ORDER — BENZONATATE 100 MG PO CAPS: 100.0000 mg | ORAL_CAPSULE | Freq: Three times a day (TID) | ORAL | 0 refills | Status: DC | PRN

## 2016-11-28 MED ORDER — DOXYCYCLINE HYCLATE 100 MG PO TABS: 100.0000 mg | ORAL_TABLET | Freq: Two times a day (BID) | ORAL | 0 refills | Status: DC

## 2016-11-28 NOTE — Assessment & Plan Note (Signed)
Treat with doxy 10d course, prednisone taper, and recommended more regular albuterol use, and may use duonebs more frequently (caution with spiriva).  Tessalon perls for cough - has worked well previously.  Red flags to seek further care reviewed.

## 2016-11-28 NOTE — Assessment & Plan Note (Signed)
Continue to encourage cessation. 

## 2016-11-28 NOTE — Patient Instructions (Addendum)
I do think you have COPD excerbation Treat with doxycycline 10 day course and prednisone taper. Increase duoneb or ventolin use to three times a day over the next 3 days.  Tessalon perls for cough.  Let us know if not improving. Watch for fever >101, worsening productive cough, or more trouble breathing. If this happens, seek urgent care.

## 2016-11-28 NOTE — Progress Notes (Signed)
BP (!) 142/66 (BP Location: Left Arm, Patient Position: Sitting, Cuff Size: Normal)   Pulse (!) 112   Temp 98.4 F (36.9 C) (Oral)   Wt 125 lb 8 oz (56.9 kg)   SpO2 90%   BMI 22.95 kg/m    CC: URI? Subjective:    Patient ID: Christine Serrano, female    DOB: 02/22/1961, 56 y.o.   MRN: [id number]  HPI: Christine Serrano is a 56 y.o. female presenting on 11/28/2016 for Cough and Nasal Congestion   Walk in patient. 1 wk h/o URI sxs - productive cough, nasal congestion, PNdrainage. + headaches. + dyspnea and wheezing. Increased sputum production as well.   No fevers/chills, ST, ear or tooth pain.  + sick contacts at home.  Treating with rest, ibuprofen.  Current smoker - 1+ ppd.  Chronic tachycardia.   Known severe O2 dependent COPD in continued smoker - managed on spiriva and symbicort. Sees pulmonology ([facility]). Recent chest CT 11/26/2016 for spiculated nodule RUL showing decrease in size from 51mm -->81mm thought resolving post inflammatory change.   Relevant past medical, surgical, family and social history reviewed and updated as indicated. Interim medical history since our last visit reviewed. Allergies and medications reviewed and updated. Outpatient Medications Prior to Visit  Medication Sig Dispense Refill  . AMITIZA 24 MCG capsule     . aspirin EC 81 MG tablet Take 81 mg by mouth daily.    . Cholecalciferol (VITAMIN D) 2000 units tablet Take 2,000 Units by mouth daily.    . cyproheptadine (PERIACTIN) 4 MG tablet Take 4 mg by mouth 2 (two) times daily.    Marland Kitchen ipratropium-albuterol (DUONEB) 0.5-2.5 (3) MG/3ML SOLN Inhale 3 mLs into the lungs every 6 (six) hours as needed. 360 mL 3  . LINZESS 290 MCG CAPS capsule Take 290 mcg by mouth daily.    . Multiple Vitamins-Minerals (MULTIVITAMIN ADULT) TABS Take 1 tablet by mouth daily.    . sertraline (ZOLOFT) 50 MG tablet Take 1 tablet (50 mg total) by mouth daily. 30 tablet 3  . SYMBICORT 160-4.5 MCG/ACT inhaler Inhale 2 puffs into  the lungs 2 (two) times daily. 3 Inhaler 4  . tiotropium (SPIRIVA) 18 MCG inhalation capsule Place 1 capsule (18 mcg total) into inhaler and inhale daily. 90 capsule 4  . traZODone (DESYREL) 50 MG tablet Take 100 mg by mouth at bedtime.    . varenicline (CHANTIX CONTINUING MONTH PAK) 1 MG tablet Take 1 tablet (1 mg total) by mouth 2 (two) times daily. 60 tablet 5  . varenicline (CHANTIX STARTING MONTH PAK) 0.5 MG X 11 & 1 MG X 42 tablet Take one 0.5 mg tablet by mouth once daily for 3 days, then increase to one 0.5 mg tablet twice daily for 4 days, then increase to one 1 mg tablet twice daily. 53 tablet 0  . VENTOLIN HFA 108 (90 Base) MCG/ACT inhaler Inhale 2 puffs into the lungs every 6 (six) hours as needed for shortness of breath. 6 Inhaler 4  . vitamin B-12 (CYANOCOBALAMIN) 1000 MCG tablet Take 1,000 mcg by mouth daily.    . vitamin E 400 UNIT capsule Take 400 Units by mouth daily.      No facility-administered medications prior to visit.      Per HPI unless specifically indicated in ROS section below Review of Systems     Objective:    BP (!) 142/66 (BP Location: Left Arm, Patient Position: Sitting, Cuff Size: Normal)   Pulse (!) 112  Temp 98.4 F (36.9 C) (Oral)   Wt 125 lb 8 oz (56.9 kg)   SpO2 90%   BMI 22.95 kg/m   Wt Readings from Last 3 Encounters:  11/28/16 125 lb 8 oz (56.9 kg)  10/25/16 121 lb 4 oz (55 kg)  09/17/16 125 lb (56.7 kg)    Physical Exam  Constitutional: She appears well-developed and well-nourished. No distress.  [place] in place  HENT:  Head: Normocephalic and atraumatic.  Right Ear: Hearing, tympanic membrane, external ear and ear canal normal.  Left Ear: Hearing, tympanic membrane, external ear and ear canal normal.  Nose: No mucosal edema or rhinorrhea. Right sinus exhibits no maxillary sinus tenderness and no frontal sinus tenderness. Left sinus exhibits no maxillary sinus tenderness and no frontal sinus tenderness.  Mouth/Throat: Uvula is midline,  oropharynx is clear and moist and mucous membranes are normal. No oropharyngeal exudate, posterior oropharyngeal edema, posterior oropharyngeal erythema or tonsillar abscesses.  Eyes: Conjunctivae and EOM are normal. Pupils are equal, round, and reactive to light. No scleral icterus.  Neck: Normal range of motion. Neck supple.  Cardiovascular: Regular rhythm, normal heart sounds and intact distal pulses.  Tachycardia present.   No murmur heard. Pulmonary/Chest: Effort normal. No respiratory distress. She has decreased breath sounds. She has wheezes (coarse throughout). She has rhonchi. She has no rales.  Coarse breath sounds but moves air adequately  Lymphadenopathy:    She has no cervical adenopathy.  Skin: Skin is warm and dry. No rash noted.  Nursing note and vitals reviewed.     Assessment & Plan:   Problem List Items Addressed This Visit    COPD (chronic obstructive pulmonary disease) ([place])    Severe oxygen dependent (3LNC) in continued smoker. Endorses compliance with spiriva and symbicort. Followed by pulm Dr Alva Garnet.       Relevant Medications   predniSONE (DELTASONE) 20 MG tablet   benzonatate (TESSALON) 100 MG capsule   COPD with acute exacerbation (HCC) - Primary    Treat with doxy 10d course, prednisone taper, and recommended more regular albuterol use, and may use duonebs more frequently (caution with spiriva).  Tessalon perls for cough - has worked well previously.  Red flags to seek further care reviewed.       Relevant Medications   predniSONE (DELTASONE) 20 MG tablet   benzonatate (TESSALON) 100 MG capsule   Tobacco abuse    Continue to encourage cessation.           Follow up plan: Return if symptoms worsen or fail to improve.  Ria Bush, MD

## 2016-11-28 NOTE — Assessment & Plan Note (Signed)
Severe oxygen dependent (3LNC) in continued smoker. Endorses compliance with spiriva and symbicort. Followed by pulm Dr Alva Garnet.

## 2016-12-06 ENCOUNTER — Telehealth: Payer: Self-pay | Admitting: Family Medicine

## 2016-12-06 NOTE — Telephone Encounter (Signed)
Patient Name: Christine Serrano DOB: [birth year] Initial Comment Caller states she was seen last Thursday, over flare up on COPD, better, but then worse. Right upper lung is hurting, and blood only is coming out of her nose. CT 2 weeks ago, no x-ray, she is wondering about a refill on medication or to be seen today. Medication: Doxicycline and Prednazone. Nurse Assessment Nurse: Cherie Dark RN, Jarrett Soho Date/Time (Eastern Time): 12/06/2016 10:30:20 AM Confirm and document reason for call. If symptomatic, describe symptoms. ---Caller states she was seen a week ago for COPD and was getting better but now she is worse. Is on 3L of O2 continuously. Pain in the right upper lobe of her lung. Having some blood when she blows her nose. Having some SOB when she moves around. Sitting is okay. Does the patient have any new or worsening symptoms? ---Yes Will a triage be completed? ---Yes Related visit to physician within the last 2 weeks? ---Yes Does the PT have any chronic conditions? (i.e. diabetes, asthma, etc.) ---Yes List chronic conditions. ---COPD, depression and anxiety, Is this a behavioral health or substance abuse call? ---No Guidelines Guideline Title Affirmed Question Affirmed Notes COPD Oxygen Monitoring and Hypoxia [1] MILD difficulty breathing (e.g., minimal/no SOB at rest, SOB with walking) AND [2] worse than normal Final Disposition User See Physician within 24 Hours [place], RN, Jarrett Soho Referrals GO TO FACILITY UNDECIDED Disagree/Comply: Comply

## 2016-12-06 NOTE — Telephone Encounter (Signed)
Patient has not went to UC yet.  Patient's daughter has her portable oxygen tank in her car and patient has to wait until she gets home to get the tank in order to even get to her truck.  However, patient does intend to go to UC as soon as the daughter gets home.

## 2016-12-06 NOTE — Telephone Encounter (Signed)
Thanks

## 2016-12-06 NOTE — Telephone Encounter (Signed)
See below- did she go to UC?  Th dispo wasn't clear to me from the note.  Thanks.

## 2016-12-09 ENCOUNTER — Telehealth: Payer: Self-pay | Admitting: *Deleted

## 2016-12-09 NOTE — Telephone Encounter (Signed)
Received a faxed refill request from [facility] for Doxycycline and Prednisone. Spoke to patient by telephone and was advised that she did not request the refills and got the medications filled at local pharmacy. Patient stated that she has a follow-up scheduled with Dr. Danise Mina tomorrow.

## 2016-12-10 ENCOUNTER — Ambulatory Visit (INDEPENDENT_AMBULATORY_CARE_PROVIDER_SITE_OTHER)
Admission: RE | Admit: 2016-12-10 | Discharge: 2016-12-10 | Disposition: A | Discharge status: Home / Self Care | Payer: Medicare HMO | Source: Ambulatory Visit | Attending: Family Medicine | Admitting: Family Medicine

## 2016-12-10 ENCOUNTER — Other Ambulatory Visit: Payer: Self-pay | Admitting: Family Medicine

## 2016-12-10 ENCOUNTER — Encounter: Payer: Self-pay | Admitting: Family Medicine

## 2016-12-10 ENCOUNTER — Ambulatory Visit (INDEPENDENT_AMBULATORY_CARE_PROVIDER_SITE_OTHER): Payer: Medicare HMO | Admitting: Family Medicine

## 2016-12-10 VITALS — BP 134/82 | HR 103 | Temp 97.7°F | Wt 123.0 lb

## 2016-12-10 DIAGNOSIS — J439 Emphysema, unspecified: Secondary | ICD-10-CM | POA: Diagnosis not present

## 2016-12-10 DIAGNOSIS — F419 Anxiety disorder, unspecified: Secondary | ICD-10-CM | POA: Diagnosis not present

## 2016-12-10 DIAGNOSIS — S29011A Strain of muscle and tendon of front wall of thorax, initial encounter: Secondary | ICD-10-CM | POA: Diagnosis not present

## 2016-12-10 DIAGNOSIS — J441 Chronic obstructive pulmonary disease with (acute) exacerbation: Secondary | ICD-10-CM

## 2016-12-10 DIAGNOSIS — J181 Lobar pneumonia, unspecified organism: Secondary | ICD-10-CM

## 2016-12-10 DIAGNOSIS — R05 Cough: Secondary | ICD-10-CM | POA: Diagnosis not present

## 2016-12-10 DIAGNOSIS — J189 Pneumonia, unspecified organism: Secondary | ICD-10-CM

## 2016-12-10 DIAGNOSIS — F410 Panic disorder [episodic paroxysmal anxiety] without agoraphobia: Secondary | ICD-10-CM | POA: Insufficient documentation

## 2016-12-10 DIAGNOSIS — R0602 Shortness of breath: Secondary | ICD-10-CM | POA: Diagnosis not present

## 2016-12-10 HISTORY — DX: Pneumonia, unspecified organism: J18.9

## 2016-12-10 MED ORDER — HYDROXYZINE HCL 25 MG PO TABS: 25.0000 mg | ORAL_TABLET | Freq: Two times a day (BID) | ORAL | 0 refills | Status: DC | PRN

## 2016-12-10 MED ORDER — LEVOFLOXACIN 500 MG PO TABS: 500.0000 mg | ORAL_TABLET | Freq: Every day | ORAL | 0 refills | Status: DC

## 2016-12-10 NOTE — Assessment & Plan Note (Signed)
Anticipate right sided chest pain actually from rib strain after prolonged cough. Supportive care reviewed. Recommended tylenol, heating pad, rest, OTC cough suppressant. Update if not improving with treatment.

## 2016-12-10 NOTE — Progress Notes (Addendum)
BP 134/82   Pulse (!) 103   Temp 97.7 F (36.5 C) (Oral)   Wt 123 lb (55.8 kg)   SpO2 94% Comment: 3LNC  BMI 22.50 kg/m    CC: f/u COPD exac "I'm better but different" Subjective:    Patient ID: Christine Serrano, female    DOB: 12-29-1960, 56 y.o.   MRN: [id number]  HPI: Christine Serrano is a 56 y.o. female presenting on 12/10/2016 for Follow-up (no improvement- URI)   See prior note for details. Seen 12 days ago with dx COPD exacerbation, treated with doxy and prednisone course. Persistent but improving productive cough, wheezing improving, staying dyspenic. She continues duoneb TID. Now with constant R chest pain that radiates to back. Staying very winded. Some bloody nose. She has increased O2 from 2.5 to 3L.   She completed prednisone and doxycycline course.  No fevers/chills. Remote h/o R PTX. Denies recent prolonged travel.   Known severe O2 dependent COPD in continued smoker - managed on spiriva and symbicort. Sees pulmonology ([facility]). Recent chest CT 11/26/2016 for spiculated nodule RUL showing decrease in size from 73mm -->48mm thought resolving post inflammatory change.   Oh by the way - she feels cyproheptadine is actually causing anxiety attacks. Hydroxyzine does work better for her.   Relevant past medical, surgical, family and social history reviewed and updated as indicated. Interim medical history since our last visit reviewed. Allergies and medications reviewed and updated. Outpatient Medications Prior to Visit  Medication Sig Dispense Refill  . AMITIZA 24 MCG capsule     . aspirin EC 81 MG tablet Take 81 mg by mouth daily.    . benzonatate (TESSALON) 100 MG capsule Take 1 capsule (100 mg total) by mouth 3 (three) times daily as needed for cough. 30 capsule 0  . Cholecalciferol (VITAMIN D) 2000 units tablet Take 2,000 Units by mouth daily.    Marland Kitchen ipratropium-albuterol (DUONEB) 0.5-2.5 (3) MG/3ML SOLN Inhale 3 mLs into the lungs every 6 (six) hours as needed. 360 mL  3  . LINZESS 290 MCG CAPS capsule Take 290 mcg by mouth daily.    . Multiple Vitamins-Minerals (MULTIVITAMIN ADULT) TABS Take 1 tablet by mouth daily.    . sertraline (ZOLOFT) 50 MG tablet Take 1 tablet (50 mg total) by mouth daily. 30 tablet 3  . SYMBICORT 160-4.5 MCG/ACT inhaler Inhale 2 puffs into the lungs 2 (two) times daily. 3 Inhaler 4  . tiotropium (SPIRIVA) 18 MCG inhalation capsule Place 1 capsule (18 mcg total) into inhaler and inhale daily. 90 capsule 4  . traZODone (DESYREL) 50 MG tablet Take 25 mg by mouth at bedtime.     . varenicline (CHANTIX CONTINUING MONTH PAK) 1 MG tablet Take 1 tablet (1 mg total) by mouth 2 (two) times daily. 60 tablet 5  . varenicline (CHANTIX STARTING MONTH PAK) 0.5 MG X 11 & 1 MG X 42 tablet Take one 0.5 mg tablet by mouth once daily for 3 days, then increase to one 0.5 mg tablet twice daily for 4 days, then increase to one 1 mg tablet twice daily. 53 tablet 0  . VENTOLIN HFA 108 (90 Base) MCG/ACT inhaler Inhale 2 puffs into the lungs every 6 (six) hours as needed for shortness of breath. 6 Inhaler 4  . vitamin B-12 (CYANOCOBALAMIN) 1000 MCG tablet Take 1,000 mcg by mouth daily.    . vitamin E 400 UNIT capsule Take 400 Units by mouth daily.     . cyproheptadine (PERIACTIN) 4 MG  tablet Take 4 mg by mouth 2 (two) times daily.    Marland Kitchen doxycycline (VIBRA-TABS) 100 MG tablet Take 1 tablet (100 mg total) by mouth 2 (two) times daily. 20 tablet 0  . predniSONE (DELTASONE) 20 MG tablet Take two tablets daily for 3 days followed by one tablet daily for 4 days 10 tablet 0   No facility-administered medications prior to visit.      Per HPI unless specifically indicated in ROS section below Review of Systems     Objective:    BP 134/82   Pulse (!) 103   Temp 97.7 F (36.5 C) (Oral)   Wt 123 lb (55.8 kg)   SpO2 94% Comment: 3LNC  BMI 22.50 kg/m   Wt Readings from Last 3 Encounters:  12/10/16 123 lb (55.8 kg)  11/28/16 125 lb 8 oz (56.9 kg)  10/25/16 121  lb 4 oz (55 kg)    Physical Exam  Constitutional: She appears well-developed and well-nourished. No distress.  [place] in place  HENT:  Head: Normocephalic and atraumatic.  Right Ear: Hearing, tympanic membrane, external ear and ear canal normal.  Left Ear: Hearing, tympanic membrane, external ear and ear canal normal.  Nose: No mucosal edema or rhinorrhea. Right sinus exhibits no maxillary sinus tenderness and no frontal sinus tenderness. Left sinus exhibits no maxillary sinus tenderness and no frontal sinus tenderness.  Mouth/Throat: Uvula is midline, oropharynx is clear and moist and mucous membranes are normal. No oropharyngeal exudate, posterior oropharyngeal edema, posterior oropharyngeal erythema or tonsillar abscesses.  Eyes: Conjunctivae and EOM are normal. Pupils are equal, round, and reactive to light. No scleral icterus.  Neck: Normal range of motion. Neck supple.  Cardiovascular: Regular rhythm, normal heart sounds and intact distal pulses.  Tachycardia present.   No murmur heard. Pulmonary/Chest: Effort normal. No respiratory distress. She has no decreased breath sounds. She has wheezes (coarse throughout, improved from prior). She has rhonchi. She has no rales. She exhibits tenderness (reproducible tenderness to palpation R 2nd,3rd intercostals).  Coarse breath sounds but moves air adequately  Lymphadenopathy:    She has no cervical adenopathy.  Skin: Skin is warm and dry. No rash noted.  Nursing note and vitals reviewed.      Assessment & Plan:   Problem List Items Addressed This Visit    Anxiety    Requests change from cyproheptadine to hydroxyzine as it works better - Rx hydroxyzine with sedation precautions.      Relevant Medications   hydrOXYzine (ATARAX/VISTARIL) 25 MG tablet   Chest wall muscle strain, initial encounter - Primary    Anticipate right sided chest pain actually from rib strain after prolonged cough. Supportive care reviewed. Recommended tylenol,  heating pad, rest, OTC cough suppressant. Update if not improving with treatment.       Community acquired pneumonia of right upper lobe of lung ([place])    UPDATE --> radiologist reads RUL PNA - will treat with levaquin 500mg  10d course and rec she return in 4-6 wks for rpt CXR to document clearance of infection. (?at area of prior abnormality on CT)      COPD (chronic obstructive pulmonary disease) (HCC)   COPD with acute exacerbation ([place])    Completed doxy, prednisone course. Some improvement but now with persistent right chest pain. Lung exam without evidence of PTX, check CXR today. CXR clear on my read. See above.       Relevant Orders   DG Chest 2 View (Completed)  Follow up plan: No Follow-up on file.  Marytza Grandpre, MD   

## 2016-12-10 NOTE — Assessment & Plan Note (Addendum)
UPDATE --> radiologist reads RUL PNA - will treat with levaquin 500mg  10d course and rec she return in 4-6 wks for rpt CXR to document clearance of infection. (?at area of prior abnormality on CT)

## 2016-12-10 NOTE — Assessment & Plan Note (Signed)
Requests change from cyproheptadine to hydroxyzine as it works better - Rx hydroxyzine with sedation precautions.

## 2016-12-10 NOTE — Assessment & Plan Note (Signed)
Completed doxy, prednisone course. Some improvement but now with persistent right chest pain. Lung exam without evidence of PTX, check CXR today. CXR clear on my read. See above.

## 2016-12-10 NOTE — Patient Instructions (Addendum)
Xray today.  I think chest pain is coming from rib cage strain. Treat with heating pad, tyelnol, continue tessalon perls, may use over the counter cough suppressant syrup like delsym or dimetapp throughout the day.  May continue plain mucinex with plenty of water.  Let us know if fever >101 or worsening productive cough or shortness of breath, or not improving with treatment.  Ok to use hydroxyzine in place of cyproheptadine.

## 2016-12-12 ENCOUNTER — Other Ambulatory Visit: Payer: Self-pay | Admitting: *Deleted

## 2016-12-12 MED ORDER — SERTRALINE HCL 50 MG PO TABS: 50.0000 mg | ORAL_TABLET | Freq: Every day | ORAL | 1 refills | Status: DC

## 2016-12-16 ENCOUNTER — Ambulatory Visit: Payer: Self-pay | Admitting: Psychology

## 2016-12-19 DIAGNOSIS — J449 Chronic obstructive pulmonary disease, unspecified: Secondary | ICD-10-CM | POA: Diagnosis not present

## 2017-01-07 ENCOUNTER — Ambulatory Visit: Payer: Self-pay | Admitting: Family Medicine

## 2017-01-07 DIAGNOSIS — Z0289 Encounter for other administrative examinations: Secondary | ICD-10-CM

## 2017-01-08 ENCOUNTER — Telehealth: Payer: Self-pay | Admitting: Family Medicine

## 2017-01-08 NOTE — Telephone Encounter (Signed)
Charge for no show. No need to reschedule urgently.

## 2017-01-08 NOTE — Telephone Encounter (Signed)
Patient did not come in for their appointment on 01/07/17 for discuss meds..  Please let me know if patient needs to be contacted immediately for follow up or no follow up needed. Do you want to charge the NSF?

## 2017-01-09 ENCOUNTER — Telehealth: Payer: Self-pay | Admitting: Family Medicine

## 2017-01-09 NOTE — Telephone Encounter (Signed)
Noted.  Ok to transfer care. Will monitor no show rate.   Thanks, Garlon Hatchet

## 2017-01-09 NOTE — Telephone Encounter (Signed)
Pt would like to transfer from Dr Diona Browner to Dr Danise Mina because he is more readily available and she feels comfortable with him. She has seen him a couple of times for acute.  She knows he is not taking new pts, however wanted me to send note.   cb number is [phone] Thanks

## 2017-01-09 NOTE — Telephone Encounter (Signed)
Called to schedule appt, pt would like to transfer providers.  Put in note for that

## 2017-01-09 NOTE — Telephone Encounter (Signed)
FYI:  Pt recently transferred care to me from [place] Established with me on 10/2016 and I recommended stopping valium.  She has no showed at least 2 times or more I believe.  Seemed pleasant.

## 2017-01-13 ENCOUNTER — Ambulatory Visit (INDEPENDENT_AMBULATORY_CARE_PROVIDER_SITE_OTHER)
Admission: RE | Admit: 2017-01-13 | Discharge: 2017-01-13 | Disposition: A | Discharge status: Home / Self Care | Payer: Medicare HMO | Source: Ambulatory Visit | Attending: Family Medicine | Admitting: Family Medicine

## 2017-01-13 ENCOUNTER — Other Ambulatory Visit: Payer: Self-pay | Admitting: *Deleted

## 2017-01-13 DIAGNOSIS — J181 Lobar pneumonia, unspecified organism: Secondary | ICD-10-CM

## 2017-01-13 DIAGNOSIS — J189 Pneumonia, unspecified organism: Secondary | ICD-10-CM | POA: Diagnosis not present

## 2017-01-13 NOTE — Telephone Encounter (Signed)
Christine Serrano walked in requesting refills on hydroxyzine 25 mg, Linzess and Diazepam 5mg   Diazepam is not on currently medication list.  Patient has recently ask to change to Dr. Danise Mina as her PCP.  Will forward request to him since he saw Christine Serrano last.

## 2017-01-14 MED ORDER — HYDROXYZINE HCL 25 MG PO TABS: 25.0000 mg | ORAL_TABLET | Freq: Two times a day (BID) | ORAL | 1 refills | Status: DC | PRN

## 2017-01-14 MED ORDER — LINZESS 290 MCG PO CAPS: 290.0000 ug | ORAL_CAPSULE | Freq: Every day | ORAL | 1 refills | Status: DC

## 2017-01-14 NOTE — Telephone Encounter (Signed)
Spoke to patient. Gave med instructions per Dr. Bosie Clos previous note. Patient verbalized understanding. Sent Rx to pharmacy in chart, requested by patient.

## 2017-01-14 NOTE — Telephone Encounter (Signed)
Don't recommend diazepam at this time - I don't see where we've filled before ar our office. rec she use hydroxyzine PRN anxiety in its place.  plz check to see where she wants this refilled then may send in 2 other meds.

## 2017-01-19 DIAGNOSIS — J449 Chronic obstructive pulmonary disease, unspecified: Secondary | ICD-10-CM | POA: Diagnosis not present

## 2017-02-04 ENCOUNTER — Telehealth: Payer: Self-pay | Admitting: Family Medicine

## 2017-02-04 ENCOUNTER — Ambulatory Visit: Payer: Medicare HMO

## 2017-02-04 DIAGNOSIS — Z13220 Encounter for screening for lipoid disorders: Secondary | ICD-10-CM

## 2017-02-04 NOTE — Telephone Encounter (Signed)
-----   Message from Eustace Pen, LPN sent at 07/26/1978 10:58 AM EDT ----- Regarding: Labs 02/04/17 Dr. Glori Bickers,  My apology for the late request. Pt will be here today at 2:30 for AWV.  Please place lab orders.  [employer] Medicare  Thank you, Katha Cabal

## 2017-02-04 NOTE — Telephone Encounter (Signed)
-----   Message from Eustace Pen, LPN sent at 8/92/1194  4:00 PM EDT ----- Regarding: Labs 02/05/17 Please place lab orders.  [facility] Medicare

## 2017-02-05 ENCOUNTER — Ambulatory Visit (INDEPENDENT_AMBULATORY_CARE_PROVIDER_SITE_OTHER): Payer: Medicare HMO

## 2017-02-05 ENCOUNTER — Other Ambulatory Visit: Payer: Self-pay

## 2017-02-05 VITALS — BP 110/70 | HR 89 | Temp 98.3°F | Ht 63.5 in | Wt 122.5 lb

## 2017-02-05 DIAGNOSIS — Z1159 Encounter for screening for other viral diseases: Secondary | ICD-10-CM

## 2017-02-05 DIAGNOSIS — Z13220 Encounter for screening for lipoid disorders: Secondary | ICD-10-CM

## 2017-02-05 DIAGNOSIS — Z0000 Encounter for general adult medical examination without abnormal findings: Secondary | ICD-10-CM

## 2017-02-05 DIAGNOSIS — Z114 Encounter for screening for human immunodeficiency virus [HIV]: Secondary | ICD-10-CM | POA: Diagnosis not present

## 2017-02-05 LAB — COMPREHENSIVE METABOLIC PANEL
ALBUMIN: 4.5 g/dL (ref 3.5–5.2)
ALK PHOS: 70 U/L (ref 39–117)
ALT: 16 U/L (ref 0–35)
AST: 25 U/L (ref 0–37)
BUN: 9 mg/dL (ref 6–23)
CALCIUM: 10.3 mg/dL (ref 8.4–10.5)
CO2: 35 mEq/L — ABNORMAL HIGH (ref 19–32)
Chloride: 98 mEq/L (ref 96–112)
Creatinine, Ser: 0.67 mg/dL (ref 0.40–1.20)
GFR: 96.05 mL/min (ref >60.00)
Glucose, Bld: 89 mg/dL (ref 70–99)
POTASSIUM: 4.8 meq/L (ref 3.5–5.1)
Sodium: 137 mEq/L (ref 135–145)
TOTAL PROTEIN: 6.9 g/dL (ref 6.0–8.3)
Total Bilirubin: 0.5 mg/dL (ref 0.2–1.2)

## 2017-02-05 LAB — LIPID PANEL
CHOLESTEROL: 207 mg/dL — AB (ref 0–200)
HDL: 43.6 mg/dL (ref >39.00)
LDL Cholesterol: 137 mg/dL — ABNORMAL HIGH (ref 0–99)
NonHDL: 163.42
TRIGLYCERIDES: 133 mg/dL (ref 0.0–149.0)
Total CHOL/HDL Ratio: 5
VLDL: 26.6 mg/dL (ref 0.0–40.0)

## 2017-02-05 NOTE — Progress Notes (Signed)
Subjective:   Christine Serrano is a 56 y.o. female who presents for Medicare Annual (Subsequent) preventive examination.  Review of Systems:  N/A Cardiac Risk Factors include: smoking/ tobacco exposure     Objective:     Vitals: BP 110/70 (BP Location: Right Arm, Patient Position: Sitting, Cuff Size: Normal)   Pulse 89   Temp 98.3 F (36.8 C) (Oral)   Ht 5' 3.5" (1.613 m) Comment: no shoes  Wt 122 lb 8 oz (55.6 kg)   SpO2 99%   BMI 21.36 kg/m   Body mass index is 21.36 kg/m.   Tobacco History  Smoking Status  . Current Every Day Smoker  . Packs/day: 3.00  . Years: 47.00  . Types: Cigarettes  Smokeless Tobacco  . Never Used     Ready to quit: No Counseling given: No   Past Medical History:  Diagnosis Date  . Cancer ([place])   . Chronic idiopathic constipation   . COPD (chronic obstructive pulmonary disease) ([place])   . Depression   . Endometriosis    Past Surgical History:  Procedure Laterality Date  . BREAST SURGERY Left    biopsy,negative  . FOOT SURGERY     Family History  Problem Relation Age of Onset  . Heart disease Mother   . Hypertension Mother   . Hypertension Father   . Hearing loss Maternal Grandmother   . Hypertension Maternal Grandmother   . Hearing loss Maternal Grandfather   . Hypertension Maternal Grandfather   . Colon cancer Paternal Grandmother    History  Sexual Activity  . Sexual activity: No    Outpatient Encounter Prescriptions as of 02/05/2017  Medication Sig  . AMITIZA 24 MCG capsule   . aspirin EC 81 MG tablet Take 81 mg by mouth daily.  . benzonatate (TESSALON) 100 MG capsule Take 1 capsule (100 mg total) by mouth 3 (three) times daily as needed for cough.  . Cholecalciferol (VITAMIN D) 2000 units tablet Take 2,000 Units by mouth daily.  . hydrOXYzine (ATARAX/VISTARIL) 25 MG tablet Take 1 tablet (25 mg total) by mouth 2 (two) times daily as needed for anxiety.  Marland Kitchen ipratropium-albuterol (DUONEB) 0.5-2.5 (3) MG/3ML SOLN  Inhale 3 mLs into the lungs every 6 (six) hours as needed.  . Multiple Vitamins-Minerals (MULTIVITAMIN ADULT) TABS Take 1 tablet by mouth daily.  . sertraline (ZOLOFT) 50 MG tablet Take 1 tablet (50 mg total) by mouth daily.  . SYMBICORT 160-4.5 MCG/ACT inhaler Inhale 2 puffs into the lungs 2 (two) times daily.  Marland Kitchen tiotropium (SPIRIVA) 18 MCG inhalation capsule Place 1 capsule (18 mcg total) into inhaler and inhale daily.  . traZODone (DESYREL) 50 MG tablet Take 25 mg by mouth at bedtime.   . varenicline (CHANTIX CONTINUING MONTH PAK) 1 MG tablet Take 1 tablet (1 mg total) by mouth 2 (two) times daily.  . varenicline (CHANTIX STARTING MONTH PAK) 0.5 MG X 11 & 1 MG X 42 tablet Take one 0.5 mg tablet by mouth once daily for 3 days, then increase to one 0.5 mg tablet twice daily for 4 days, then increase to one 1 mg tablet twice daily.  . VENTOLIN HFA 108 (90 Base) MCG/ACT inhaler Inhale 2 puffs into the lungs every 6 (six) hours as needed for shortness of breath.  . vitamin B-12 (CYANOCOBALAMIN) 1000 MCG tablet Take 1,000 mcg by mouth daily.  . vitamin E 400 UNIT capsule Take 400 Units by mouth daily.   . [DISCONTINUED] levofloxacin (LEVAQUIN) 500 MG tablet  Take 1 tablet (500 mg total) by mouth daily.  Marland Kitchen LINZESS 290 MCG CAPS capsule Take 1 capsule (290 mcg total) by mouth daily. (Patient not taking: Reported on 02/05/2017)   No facility-administered encounter medications on file as of 02/05/2017.     Activities of Daily Living In your present state of health, do you have any difficulty performing the following activities: 02/05/2017 04/10/2016  Hearing? N N  Vision? N Y  Difficulty concentrating or making decisions? N N  Walking or climbing stairs? Y Y  Dressing or bathing? N N  Doing errands, shopping? N N  Preparing Food and eating ? N -  Using the Toilet? N -  In the past six months, have you accidently leaked urine? N -  Do you have problems with loss of bowel control? N -  Managing your  Medications? N -  Managing your Finances? N -  Housekeeping or managing your Housekeeping? N -  Some recent data might be hidden    Patient Care Team: Ria Bush, MD as PCP - General (Family Medicine)    Assessment:     Hearing Screening   125Hz  250Hz  500Hz  1000Hz  2000Hz  3000Hz  4000Hz  6000Hz  8000Hz   Right ear:   40 40 40  40    Left ear:   40 40 40  40      Visual Acuity Screening   Right eye Left eye Both eyes  Without correction: 20/25 20/25 20/25   With correction:       Exercise Activities and Dietary recommendations Current Exercise Habits: The patient does not participate in regular exercise at present, Exercise limited by: respiratory conditions(s);cardiac condition(s)  Goals    . Increase physical activity          Starting 02/06/17, I will begin exercising for at least 15 min 3 days per week.       Fall Risk Fall Risk  02/05/2017 06/11/2016  Falls in the past year? No No   Depression Screen PHQ 2/9 Scores 02/05/2017 10/25/2016 06/11/2016  PHQ - 2 Score 2 6 3   PHQ- 9 Score 5 16 8      Cognitive Function MMSE - Mini Mental State Exam 02/05/2017  Orientation to time 5  Orientation to Place 5  Registration 3  Attention/ Calculation 0  Recall 3  Language- name 2 objects 0  Language- repeat 1  Language- follow 3 step command 3  Language- read & follow direction 0  Write a sentence 0  Copy design 0  Total score 20     PLEASE NOTE: A Mini-Cog screen was completed. Maximum score is 20. A value of 0 denotes this part of [place] MMSE was not completed or the patient failed this part of the Mini-Cog screening.   Mini-Cog Screening Orientation to Time - Max 5 pts Orientation to Place - Max 5 pts Registration - Max 3 pts Recall - Max 3 pts Language Repeat - Max 1 pts Language Follow 3 Step Command - Max 3 pts     Immunization History  Administered Date(s) Administered  . Influenza Split 04/17/2016   Screening Tests Health Maintenance  Topic Date  Due  . INFLUENZA VACCINE  09/14/2017 (Originally 01/15/2017)  . MAMMOGRAM  02/05/2018 (Originally 10/19/2011)  . PAP SMEAR  02/05/2018 (Originally 01/04/1980)  . COLONOSCOPY  02/05/2018 (Originally 01/03/2009)  . DTaP/Tdap/Td (1 - Tdap) 02/06/2027 (Originally 01/03/1978)  . TETANUS/TDAP  02/06/2027 (Originally 01/03/1978)  . Hepatitis C Screening  Completed  . HIV Screening  Completed  Plan:     I have personally reviewed and addressed the Medicare Annual Wellness questionnaire and have noted the following in the patient's chart:  A. Medical and social history B. Use of alcohol, tobacco or illicit drugs  C. Current medications and supplements D. Functional ability and status E.  Nutritional status F.  Physical activity G. Advance directives H. List of other physicians I.  Hospitalizations, surgeries, and ER visits in previous 12 months J.  [place] to include hearing, vision, cognitive, depression L. Referrals and appointments - none  In addition, I have reviewed and discussed with patient certain preventive protocols, quality metrics, and best practice recommendations. A written personalized care plan for preventive services as well as general preventive health recommendations were provided to patient.  See attached scanned questionnaire for additional information.   Signed,   Lindell Noe, MHA, BS, LPN Health Coach

## 2017-02-05 NOTE — Telephone Encounter (Signed)
Patient was in today for AWV.  Per patient, insurance will not allow medication to be filled until it gets more information from PCP.

## 2017-02-05 NOTE — Progress Notes (Signed)
I reviewed health advisor's note, was available for consultation, and agree with documentation and plan.  

## 2017-02-05 NOTE — Patient Instructions (Signed)
Christine Serrano , Thank you for taking time to come for your Medicare Wellness Visit. I appreciate your ongoing commitment to your health goals. Please review the following plan we discussed and let me know if I can assist you in the future.   These are the goals we discussed: Goals    . Increase physical activity          Starting 02/06/17, I will begin exercising for at least 15 min 3 days per week.        This is a list of the screening recommended for you and due dates:  Health Maintenance  Topic Date Due  . Flu Shot  09/14/2017*  . Mammogram  02/05/2018*  . Pap Smear  02/05/2018*  . Colon Cancer Screening  02/05/2018*  . Tetanus Vaccine  02/06/2027*  .  Hepatitis C: One time screening is recommended by Center for Disease Control  (CDC) for  adults born from [birth year] through 1967.   Completed  . HIV Screening  Completed  *Topic was postponed. The date shown is not the original due date.   Preventive Care for Adults  A healthy lifestyle and preventive care can promote health and wellness. Preventive health guidelines for adults include the following key practices.  . A routine yearly physical is a good way to check with your health care provider about your health and preventive screening. It is a chance to share any concerns and updates on your health and to receive a thorough exam.  . Visit your dentist for a routine exam and preventive care every 6 months. Brush your teeth twice a day and floss once a day. Good oral hygiene prevents tooth decay and gum disease.  . The frequency of eye exams is based on your age, health, family medical history, use  of contact lenses, and other factors. Follow your health care provider's ecommendations for frequency of eye exams.  . Eat a healthy diet. Foods like vegetables, fruits, whole grains, low-fat dairy products, and lean protein foods contain the nutrients you need without too many calories. Decrease your intake of foods high in solid fats,  added sugars, and salt. Eat the right amount of calories for you. Get information about a proper diet from your health care provider, if necessary.  . Regular physical exercise is one of the most important things you can do for your health. Most adults should get at least 150 minutes of moderate-intensity exercise (any activity that increases your heart rate and causes you to sweat) each week. In addition, most adults need muscle-strengthening exercises on 2 or more days a week.  Silver Sneakers may be a benefit available to you. To determine eligibility, you may visit the website: www.silversneakers.com or contact program at 308-023-3497 Mon-Fri between 8AM-8PM.   . Maintain a healthy weight. The body mass index (BMI) is a screening tool to identify possible weight problems. It provides an estimate of body fat based on height and weight. Your health care provider can find your BMI and can help you achieve or maintain a healthy weight.   For adults 20 years and older: ? A BMI below 18.5 is considered underweight. ? A BMI of 18.5 to 24.9 is normal. ? A BMI of 25 to 29.9 is considered overweight. ? A BMI of 30 and above is considered obese.   . Maintain normal blood lipids and cholesterol levels by exercising and minimizing your intake of saturated fat. Eat a balanced diet with plenty of fruit and vegetables. Blood  tests for lipids and cholesterol should begin at age 56 and be repeated every 5 years. If your lipid or cholesterol levels are high, you are over 50, or you are at high risk for heart disease, you may need your cholesterol levels checked more frequently. Ongoing high lipid and cholesterol levels should be treated with medicines if diet and exercise are not working.  . If you smoke, find out from your health care provider how to quit. If you do not use tobacco, please do not start.  . If you choose to drink alcohol, please do not consume more than 2 drinks per day. One drink is  considered to be 12 ounces (355 mL) of beer, 5 ounces (148 mL) of wine, or 1.5 ounces (44 mL) of liquor.  . If you are 17-31 years old, ask your health care provider if you should take aspirin to prevent strokes.  . Use sunscreen. Apply sunscreen liberally and repeatedly throughout the day. You should seek shade when your shadow is shorter than you. Protect yourself by wearing long sleeves, pants, a wide-brimmed hat, and sunglasses year round, whenever you are outdoors.  . Once a month, do a whole body skin exam, using a mirror to look at the skin on your back. Tell your health care provider of new moles, moles that have irregular borders, moles that are larger than a pencil eraser, or moles that have changed in shape or color.

## 2017-02-05 NOTE — Progress Notes (Signed)
PCP notes:   Health maintenance:  Flu vaccine - addressed Mammogram - PCP please discuss at next appt Pap smear - PCP please discuss at next appt Colon cancer screening - PCP please discuss at next appt Tetanus - postponed/insurance Hep C screening - completed HIV screening - completed  Abnormal screenings:   Depression score: 5  Patient concerns:   Patient has requested refill for Linzess.  Patient wants to discuss with PCP if a PNA vaccine is needed.  Nurse concerns:  None  Next PCP appt:   02/21/17 @ 1415

## 2017-02-06 LAB — HEPATITIS C ANTIBODY: HCV Ab: REACTIVE — AB

## 2017-02-06 LAB — HIV ANTIBODY (ROUTINE TESTING W REFLEX): HIV: NONREACTIVE

## 2017-02-06 MED ORDER — LINZESS 290 MCG PO CAPS: 290.0000 ug | ORAL_CAPSULE | Freq: Every day | ORAL | 1 refills | Status: DC

## 2017-02-06 NOTE — Telephone Encounter (Signed)
Will await notification for PA. Occasionally, it is just a pending Rx that requires filling and a PA is not required, only authorization

## 2017-02-06 NOTE — Telephone Encounter (Signed)
plz fill out PA for patient. May need to see what she's tried before.

## 2017-02-07 LAB — HEPATITIS C RNA QUANTITATIVE
HCV QUANT LOG: NOT DETECTED {Log_IU}/mL
HCV QUANT: NOT DETECTED [IU]/mL

## 2017-02-10 DIAGNOSIS — J449 Chronic obstructive pulmonary disease, unspecified: Secondary | ICD-10-CM | POA: Diagnosis not present

## 2017-02-14 ENCOUNTER — Ambulatory Visit: Payer: Medicare HMO | Admitting: Family Medicine

## 2017-02-19 DIAGNOSIS — J449 Chronic obstructive pulmonary disease, unspecified: Secondary | ICD-10-CM | POA: Diagnosis not present

## 2017-02-21 ENCOUNTER — Other Ambulatory Visit (HOSPITAL_COMMUNITY)
Admission: RE | Admit: 2017-02-21 | Discharge: 2017-02-21 | Disposition: A | Discharge status: Home / Self Care | Payer: Medicare HMO | Source: Ambulatory Visit | Attending: Family Medicine | Admitting: Family Medicine

## 2017-02-21 ENCOUNTER — Ambulatory Visit (INDEPENDENT_AMBULATORY_CARE_PROVIDER_SITE_OTHER): Payer: Medicare HMO | Admitting: Family Medicine

## 2017-02-21 ENCOUNTER — Encounter: Payer: Self-pay | Admitting: Family Medicine

## 2017-02-21 VITALS — BP 110/68 | HR 97 | Temp 97.6°F | Resp 18 | Ht 63.5 in | Wt 120.2 lb

## 2017-02-21 DIAGNOSIS — Z720 Tobacco use: Secondary | ICD-10-CM

## 2017-02-21 DIAGNOSIS — R768 Other specified abnormal immunological findings in serum: Secondary | ICD-10-CM

## 2017-02-21 DIAGNOSIS — Z23 Encounter for immunization: Secondary | ICD-10-CM | POA: Insufficient documentation

## 2017-02-21 DIAGNOSIS — E785 Hyperlipidemia, unspecified: Secondary | ICD-10-CM | POA: Diagnosis not present

## 2017-02-21 DIAGNOSIS — Z0000 Encounter for general adult medical examination without abnormal findings: Secondary | ICD-10-CM | POA: Diagnosis not present

## 2017-02-21 DIAGNOSIS — M5441 Lumbago with sciatica, right side: Secondary | ICD-10-CM

## 2017-02-21 DIAGNOSIS — I700 Atherosclerosis of aorta: Secondary | ICD-10-CM | POA: Diagnosis not present

## 2017-02-21 DIAGNOSIS — Z0001 Encounter for general adult medical examination with abnormal findings: Secondary | ICD-10-CM | POA: Insufficient documentation

## 2017-02-21 DIAGNOSIS — Z79899 Other long term (current) drug therapy: Secondary | ICD-10-CM | POA: Insufficient documentation

## 2017-02-21 DIAGNOSIS — Z87898 Personal history of other specified conditions: Secondary | ICD-10-CM | POA: Diagnosis not present

## 2017-02-21 DIAGNOSIS — J439 Emphysema, unspecified: Secondary | ICD-10-CM | POA: Diagnosis not present

## 2017-02-21 DIAGNOSIS — K5909 Other constipation: Secondary | ICD-10-CM | POA: Diagnosis not present

## 2017-02-21 DIAGNOSIS — J181 Lobar pneumonia, unspecified organism: Secondary | ICD-10-CM

## 2017-02-21 DIAGNOSIS — E2839 Other primary ovarian failure: Secondary | ICD-10-CM | POA: Diagnosis not present

## 2017-02-21 DIAGNOSIS — Z7982 Long term (current) use of aspirin: Secondary | ICD-10-CM | POA: Insufficient documentation

## 2017-02-21 DIAGNOSIS — F5104 Psychophysiologic insomnia: Secondary | ICD-10-CM

## 2017-02-21 DIAGNOSIS — J189 Pneumonia, unspecified organism: Secondary | ICD-10-CM

## 2017-02-21 MED ORDER — NAPROXEN 375 MG PO TABS: ORAL_TABLET | ORAL | 0 refills | Status: DC

## 2017-02-21 NOTE — Assessment & Plan Note (Signed)
Preventative protocols reviewed and updated unless pt declined. Discussed healthy diet and lifestyle.  

## 2017-02-21 NOTE — Patient Instructions (Addendum)
Flu shot today.  Low cholesterol diet handout provided today.  We will sign you up for cologuard.  Pap smear, pelvic exam, breast exam today. We will schedule you for mammogram and bone density scan.  For right back pain - start naprosyn '375mg'$  twice daily with meals for 5 days then as needed. Good to see you today. Return in 4 months for follow up visit.   Health Maintenance, Female Adopting a healthy lifestyle and getting preventive care can go a long way to promote health and wellness. Talk with your health care provider about what schedule of regular examinations is right for you. This is a good chance for you to check in with your provider about disease prevention and staying healthy. In between checkups, there are plenty of things you can do on your own. Experts have done a lot of research about which lifestyle changes and preventive measures are most likely to keep you healthy. Ask your health care provider for more information. Weight and diet Eat a healthy diet  Be sure to include plenty of vegetables, fruits, low-fat dairy products, and lean protein.  Do not eat a lot of foods high in solid fats, added sugars, or salt.  Get regular exercise. This is one of the most important things you can do for your health. ? Most adults should exercise for at least 150 minutes each week. The exercise should increase your heart rate and make you sweat (moderate-intensity exercise). ? Most adults should also do strengthening exercises at least twice a week. This is in addition to the moderate-intensity exercise.  Maintain a healthy weight  Body mass index (BMI) is a measurement that can be used to identify possible weight problems. It estimates body fat based on height and weight. Your health care provider can help determine your BMI and help you achieve or maintain a healthy weight.  For females 78 years of age and older: ? A BMI below 18.5 is considered underweight. ? A BMI of 18.5 to 24.9 is  normal. ? A BMI of 25 to 29.9 is considered overweight. ? A BMI of 30 and above is considered obese.  Watch levels of cholesterol and blood lipids  You should start having your blood tested for lipids and cholesterol at 58 years of age, then have this test every 5 years.  You may need to have your cholesterol levels checked more often if: ? Your lipid or cholesterol levels are high. ? You are older than 58 years of age. ? You are at high risk for heart disease.  Cancer screening Lung Cancer  Lung cancer screening is recommended for adults 66-41 years old who are at high risk for lung cancer because of a history of smoking.  A yearly low-dose CT scan of the lungs is recommended for people who: ? Currently smoke. ? Have quit within the past 15 years. ? Have at least a 30-pack-year history of smoking. A pack year is smoking an average of one pack of cigarettes a day for 1 year.  Yearly screening should continue until it has been 15 years since you quit.  Yearly screening should stop if you develop a health problem that would prevent you from having lung cancer treatment.  Breast Cancer  Practice breast self-awareness. This means understanding how your breasts normally appear and feel.  It also means doing regular breast self-exams. Let your health care provider know about any changes, no matter how small.  If you are in your 20s or 30s,  you should have a clinical breast exam (CBE) by a health care provider every 1-3 years as part of a regular health exam.  If you are 40 or older, have a CBE every year. Also consider having a breast X-ray (mammogram) every year.  If you have a family history of breast cancer, talk to your health care provider about genetic screening.  If you are at high risk for breast cancer, talk to your health care provider about having an MRI and a mammogram every year.  Breast cancer gene (BRCA) assessment is recommended for women who have family members  with BRCA-related cancers. BRCA-related cancers include: ? Breast. ? Ovarian. ? Tubal. ? Peritoneal cancers.  Results of the assessment will determine the need for genetic counseling and BRCA1 and BRCA2 testing.  Cervical Cancer Your health care provider may recommend that you be screened regularly for cancer of the pelvic organs (ovaries, uterus, and vagina). This screening involves a pelvic examination, including checking for microscopic changes to the surface of your cervix (Pap test). You may be encouraged to have this screening done every 3 years, beginning at age 21.  For women ages 30-65, health care providers may recommend pelvic exams and Pap testing every 3 years, or they may recommend the Pap and pelvic exam, combined with testing for human papilloma virus (HPV), every 5 years. Some types of HPV increase your risk of cervical cancer. Testing for HPV may also be done on women of any age with unclear Pap test results.  Other health care providers may not recommend any screening for nonpregnant women who are considered low risk for pelvic cancer and who do not have symptoms. Ask your health care provider if a screening pelvic exam is right for you.  If you have had past treatment for cervical cancer or a condition that could lead to cancer, you need Pap tests and screening for cancer for at least 20 years after your treatment. If Pap tests have been discontinued, your risk factors (such as having a new sexual partner) need to be reassessed to determine if screening should resume. Some women have medical problems that increase the chance of getting cervical cancer. In these cases, your health care provider may recommend more frequent screening and Pap tests.  Colorectal Cancer  This type of cancer can be detected and often prevented.  Routine colorectal cancer screening usually begins at 58 years of age and continues through 58 years of age.  Your health care provider may recommend  screening at an earlier age if you have risk factors for colon cancer.  Your health care provider may also recommend using home test kits to check for hidden blood in the stool.  A small camera at the end of a tube can be used to examine your colon directly (sigmoidoscopy or colonoscopy). This is done to check for the earliest forms of colorectal cancer.  Routine screening usually begins at age 50.  Direct examination of the colon should be repeated every 5-10 years through 58 years of age. However, you may need to be screened more often if early forms of precancerous polyps or small growths are found.  Skin Cancer  Check your skin from head to toe regularly.  Tell your health care provider about any new moles or changes in moles, especially if there is a change in a mole's shape or color.  Also tell your health care provider if you have a mole that is larger than the size of a pencil eraser.    Always use sunscreen. Apply sunscreen liberally and repeatedly throughout the day.  Protect yourself by wearing long sleeves, pants, a wide-brimmed hat, and sunglasses whenever you are outside.  Heart disease, diabetes, and high blood pressure  High blood pressure causes heart disease and increases the risk of stroke. High blood pressure is more likely to develop in: ? People who have blood pressure in the high end of the normal range (130-139/85-89 mm Hg). ? People who are overweight or obese. ? People who are African American.  If you are 18-39 years of age, have your blood pressure checked every 3-5 years. If you are 40 years of age or older, have your blood pressure checked every year. You should have your blood pressure measured twice-once when you are at a hospital or clinic, and once when you are not at a hospital or clinic. Record the average of the two measurements. To check your blood pressure when you are not at a hospital or clinic, you can use: ? An automated blood pressure machine at  a pharmacy. ? A home blood pressure monitor.  If you are between 55 years and 79 years old, ask your health care provider if you should take aspirin to prevent strokes.  Have regular diabetes screenings. This involves taking a blood sample to check your fasting blood sugar level. ? If you are at a normal weight and have a low risk for diabetes, have this test once every three years after 58 years of age. ? If you are overweight and have a high risk for diabetes, consider being tested at a younger age or more often. Preventing infection Hepatitis B  If you have a higher risk for hepatitis B, you should be screened for this virus. You are considered at high risk for hepatitis B if: ? You were born in a country where hepatitis B is common. Ask your health care provider which countries are considered high risk. ? Your parents were born in a high-risk country, and you have not been immunized against hepatitis B (hepatitis B vaccine). ? You have HIV or AIDS. ? You use needles to inject street drugs. ? You live with someone who has hepatitis B. ? You have had sex with someone who has hepatitis B. ? You get hemodialysis treatment. ? You take certain medicines for conditions, including cancer, organ transplantation, and autoimmune conditions.  Hepatitis C  Blood testing is recommended for: ? Everyone born from 1945 through 1965. ? Anyone with known risk factors for hepatitis C.  Sexually transmitted infections (STIs)  You should be screened for sexually transmitted infections (STIs) including gonorrhea and chlamydia if: ? You are sexually active and are younger than 58 years of age. ? You are older than 58 years of age and your health care provider tells you that you are at risk for this type of infection. ? Your sexual activity has changed since you were last screened and you are at an increased risk for chlamydia or gonorrhea. Ask your health care provider if you are at risk.  If you do  not have HIV, but are at risk, it may be recommended that you take a prescription medicine daily to prevent HIV infection. This is called pre-exposure prophylaxis (PrEP). You are considered at risk if: ? You are sexually active and do not regularly use condoms or know the HIV status of your partner(s). ? You take drugs by injection. ? You are sexually active with a partner who has HIV.  Talk with   your health care provider about whether you are at high risk of being infected with HIV. If you choose to begin PrEP, you should first be tested for HIV. You should then be tested every 3 months for as long as you are taking PrEP. Pregnancy  If you are premenopausal and you may become pregnant, ask your health care provider about preconception counseling.  If you may become pregnant, take 400 to 800 micrograms (mcg) of folic acid every day.  If you want to prevent pregnancy, talk to your health care provider about birth control (contraception). Osteoporosis and menopause  Osteoporosis is a disease in which the bones lose minerals and strength with aging. This can result in serious bone fractures. Your risk for osteoporosis can be identified using a bone density scan.  If you are 94 years of age or older, or if you are at risk for osteoporosis and fractures, ask your health care provider if you should be screened.  Ask your health care provider whether you should take a calcium or vitamin D supplement to lower your risk for osteoporosis.  Menopause may have certain physical symptoms and risks.  Hormone replacement therapy may reduce some of these symptoms and risks. Talk to your health care provider about whether hormone replacement therapy is right for you. Follow these instructions at home:  Schedule regular health, dental, and eye exams.  Stay current with your immunizations.  Do not use any tobacco products including cigarettes, chewing tobacco, or electronic cigarettes.  If you are  pregnant, do not drink alcohol.  If you are breastfeeding, limit how much and how often you drink alcohol.  Limit alcohol intake to no more than 1 drink per day for nonpregnant women. One drink equals 12 ounces of beer, 5 ounces of wine, or 1 ounces of hard liquor.  Do not use street drugs.  Do not share needles.  Ask your health care provider for help if you need support or information about quitting drugs.  Tell your health care provider if you often feel depressed.  Tell your health care provider if you have ever been abused or do not feel safe at home. This information is not intended to replace advice given to you by your health care provider. Make sure you discuss any questions you have with your health care provider. Document Released: 12/17/2010 Document Revised: 11/09/2015 Document Reviewed: 03/07/2015 Elsevier Interactive Patient Education  [employer].

## 2017-02-21 NOTE — Assessment & Plan Note (Addendum)
Mild off statin. Low 10 yr risk. Continue to encourage smoking cessation.  The 10-year ASCVD risk score Mikey Bussing DC Brooke Bonito., et al., 2013) is: 5.5%   Values used to calculate the score:     Age: 57 years     Sex: Female     Is Non-Hispanic African American: No     Diabetic: No     Tobacco smoker: Yes     Systolic Blood Pressure: 664 mmHg     Is BP treated: No     HDL Cholesterol: 43.6 mg/dL     Total Cholesterol: 207 mg/dL

## 2017-02-21 NOTE — Progress Notes (Signed)
BP 110/68 (BP Location: Left Arm, Patient Position: Sitting, Cuff Size: Normal)   Pulse 97   Temp 97.6 F (36.4 C) (Oral)   Resp 18   Ht 5' 3.5" (1.613 m)   Wt 120 lb 4 oz (54.5 kg)   SpO2 98% Comment: 3 L  BMI 20.97 kg/m    CC: CPE Subjective:    Patient ID: Christine Serrano, female    DOB: Jul 11, 1960, 56 y.o.   MRN: [id number]  HPI: ORPAH HAUSNER is a 56 y.o. female presenting on 02/21/2017 for Medicare Wellness   Saw Christine Serrano 2 weeks ago for medicare wellness visit. Note reviewed.  O2 dependent COPD. Has cut down to 1 ppd. Using chantix. Some nausea with this.   Chronic constipation - saw GI who recommended amitiza and linzess together. Some intermittent diarrhea with this.   [place] off horse and thinks she pinched her back R lower back pain with some radiation down R leg to thigh. Denies numbness/weakness of leg. No bowel/bladder incontinence, fevers.   Preventative:  Colon cancer screening - paternal grandmother with colon cancer. Discussed options - agrees to cologuard. Mammogram 2011 - L breast asymmetry per pt benign fatty deposit rec f/u 6 mo, did not return. Agrees to repeat.  Well woman exam - h/o cervical abnormalities s/p conization 1978, then laser surgery 1987. Last pap 10 yrs ago. Agrees to rpt today.  DEXA - never done in the past.  Flu shot yearly  Pneumovax 2010. Endorses h/o multiple pneumonias in the past including recently Tetanus - declines for now Seat belt use discussed Sunscreen use discussed. No changing moles on skin. 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 Interim medical history since our last visit reviewed. Allergies and medications reviewed  and updated. Outpatient Medications Prior to Visit  Medication Sig Dispense Refill  . AMITIZA 24 MCG capsule     . aspirin EC 81 MG tablet Take 81 mg by mouth daily.    . benzonatate (TESSALON) 100 MG capsule Take 1 capsule (100 mg total) by mouth 3 (three) times daily as needed for cough. 30 capsule 0  . Cholecalciferol (VITAMIN D) 2000 units tablet Take 2,000 Units by mouth daily.    . hydrOXYzine (ATARAX/VISTARIL) 25 MG tablet Take 1 tablet (25 mg total) by mouth 2 (two) times daily as needed for anxiety. 180 tablet 1  . ipratropium-albuterol (DUONEB) 0.5-2.5 (3) MG/3ML SOLN Inhale 3 mLs into the lungs every 6 (six) hours as needed. 360 mL 3  . LINZESS 290 MCG CAPS capsule Take 1 capsule (290 mcg total) by mouth daily. 90 capsule 1  . Multiple Vitamins-Minerals (MULTIVITAMIN ADULT) TABS Take 1 tablet by mouth daily.    . sertraline (ZOLOFT) 50 MG tablet Take 1 tablet (50 mg total) by mouth daily. 90 tablet 1  . SYMBICORT 160-4.5 MCG/ACT inhaler Inhale 2 puffs into the lungs 2 (two) times daily. 3 Inhaler 4  . tiotropium (SPIRIVA) 18 MCG inhalation capsule Place 1 capsule (18 mcg total) into inhaler and inhale daily. 90 capsule 4  . traZODone (DESYREL) 50 MG tablet Take 25 mg by mouth at bedtime.     . varenicline (CHANTIX CONTINUING MONTH PAK) 1 MG tablet Take 1  tablet (1 mg total) by mouth 2 (two) times daily. 60 tablet 5  . varenicline (CHANTIX STARTING MONTH PAK) 0.5 MG X 11 & 1 MG X 42 tablet Take one 0.5 mg tablet by mouth once daily for 3 days, then increase to one 0.5 mg tablet twice daily for 4 days, then increase to one 1 mg tablet twice daily. 53 tablet 0  . VENTOLIN HFA 108 (90 Base) MCG/ACT inhaler Inhale 2 puffs into the lungs every 6 (six) hours as needed for shortness of breath. 6 Inhaler 4  . vitamin B-12 (CYANOCOBALAMIN) 1000 MCG tablet Take 1,000 mcg by mouth daily.    . vitamin E 400 UNIT capsule Take 400 Units by mouth daily.      No facility-administered medications prior  to visit.      Per HPI unless specifically indicated in ROS section below Review of Systems  Constitutional: Negative for activity change, appetite change, chills, fatigue, fever and unexpected weight change.  HENT: Negative for hearing loss.   Eyes: Negative for visual disturbance.  Respiratory: Positive for cough, shortness of breath and wheezing. Negative for chest tightness.   Cardiovascular: Negative for chest pain, palpitations and leg swelling.  Gastrointestinal: Positive for constipation (chronic) and diarrhea. Negative for abdominal distention, abdominal pain, blood in stool, nausea and vomiting.  Genitourinary: Negative for difficulty urinating and hematuria.  Musculoskeletal: Negative for arthralgias, myalgias and neck pain.  Skin: Negative for rash.  Neurological: Negative for dizziness, seizures, syncope and headaches.  Hematological: Negative for adenopathy. Bruises/bleeds easily.  Psychiatric/Behavioral: Positive for dysphoric mood. The patient is nervous/anxious.        Financial stressors       Objective:    BP 110/68 (BP Location: Left Arm, Patient Position: Sitting, Cuff Size: Normal)   Pulse 97   Temp 97.6 F (36.4 C) (Oral)   Resp 18   Ht 5' 3.5" (1.613 m)   Wt 120 lb 4 oz (54.5 kg)   SpO2 98% Comment: 3 L  BMI 20.97 kg/m   Wt Readings from Last 3 Encounters:  02/21/17 120 lb 4 oz (54.5 kg)  02/05/17 122 lb 8 oz (55.6 kg)  12/10/16 123 lb (55.8 kg)    Physical Exam  Constitutional: She is oriented to person, place, and time. She appears well-developed and well-nourished. No distress.  HENT:  Head: Normocephalic and atraumatic.  Right Ear: Hearing, tympanic membrane, external ear and ear canal normal.  Left Ear: Hearing, tympanic membrane, external ear and ear canal normal.  Nose: Nose normal.  Mouth/Throat: Uvula is midline, oropharynx is clear and moist and mucous membranes are normal. No oropharyngeal exudate, posterior oropharyngeal edema or  posterior oropharyngeal erythema.  Eyes: Pupils are equal, round, and reactive to light. Conjunctivae and EOM are normal. No scleral icterus.  Neck: Normal range of motion. Neck supple. No thyromegaly present.  Cardiovascular: Normal rate, regular rhythm, normal heart sounds and intact distal pulses.   No murmur heard. Pulses:      Radial pulses are 2+ on the right side, and 2+ on the left side.  Pulmonary/Chest: Effort normal. No respiratory distress. She has wheezes (faint). She has no rales. Right breast exhibits no inverted nipple, no mass, no nipple discharge, no skin change and no tenderness. Left breast exhibits no inverted nipple, no mass, no nipple discharge, no skin change and no tenderness.  Coarse throughout  Abdominal: Soft. Bowel sounds are normal. She exhibits no distension and no mass. There is no tenderness. There is no  rebound and no guarding.  Genitourinary: Vagina normal and uterus normal. Pelvic exam was performed with patient supine. There is no rash, tenderness or lesion on the right labia. There is no rash, tenderness or lesion on the left labia. Cervix exhibits no motion tenderness. Right adnexum displays no mass and no tenderness. Left adnexum displays no mass and no tenderness.  Genitourinary Comments: Difficulty visualizing cervical os. Pap performed regardless.   Musculoskeletal: Normal range of motion. She exhibits no edema.  No pain midline spine No paraspinous mm tenderness Neg SLR bilaterally. No pain with int/ext rotation at hip.  Lymphadenopathy:       Head (right side): No submental, no submandibular, no tonsillar, no preauricular and no posterior auricular adenopathy present.       Head (left side): No submental, no submandibular, no tonsillar, no preauricular and no posterior auricular adenopathy present.    She has no cervical adenopathy.    She has no axillary adenopathy.       Right axillary: No lateral adenopathy present.       Left axillary: No  lateral adenopathy present.      Right: No supraclavicular adenopathy present.       Left: No supraclavicular adenopathy present.  Neurological: She is alert and oriented to person, place, and time.  CN grossly intact, station and gait intact 5/5 strength BLE  Skin: Skin is warm and dry. No rash noted.  Psychiatric: She has a normal mood and affect. Her behavior is normal. Judgment and thought content normal.  Nursing note and vitals reviewed.  Results for orders placed or performed in visit on 02/05/17  Lipid panel  Result Value Ref Range   Cholesterol 207 (H) 0 - 200 mg/dL   Triglycerides 133.0 0.0 - 149.0 mg/dL   HDL 43.60 >39.00 mg/dL   VLDL 26.6 0.0 - 40.0 mg/dL   LDL Cholesterol 137 (H) 0 - 99 mg/dL   Total CHOL/HDL Ratio 5    NonHDL 163.42   Comprehensive metabolic panel  Result Value Ref Range   Sodium 137 135 - 145 mEq/L   Potassium 4.8 3.5 - 5.1 mEq/L   Chloride 98 96 - 112 mEq/L   CO2 35 (H) 19 - 32 mEq/L   Glucose, Bld 89 70 - 99 mg/dL   BUN 9 6 - 23 mg/dL   Creatinine, Ser 0.67 0.40 - 1.20 mg/dL   Total Bilirubin 0.5 0.2 - 1.2 mg/dL   Alkaline Phosphatase 70 39 - 117 U/L   AST 25 0 - 37 U/L   ALT 16 0 - 35 U/L   Total Protein 6.9 6.0 - 8.3 g/dL   Albumin 4.5 3.5 - 5.2 g/dL   Calcium 10.3 8.4 - 10.5 mg/dL   GFR 96.05 >60.00 mL/min  Hepatitis C antibody  Result Value Ref Range   HCV Ab REACTIVE (A) NON-REACTIVE  HIV antibody (with reflex)  Result Value Ref Range   HIV 1&2 Ab, 4th Generation NONREACTIVE NONREACTIVE  Hepatitis C RNA quantitative  Result Value Ref Range   HCV Quantitative <15 NOT DETECTED NOT DETECTED IU/mL   HCV Quantitative Log <1.18 NOT DETECTED NOT DETECTED Log IU/mL      Assessment & Plan:   Problem List Items Addressed This Visit    Aortic atherosclerosis (HCC)    Diffuse ATH of thoracic aorta and high grade stenosis of L subclavian artery.  Discussed aspirin. Not on statin. Continue to encourage ongoing attempts at smoking  cessation.       Chronic  constipation    Endorses some intermittent diarrhea on linzess and amitiza (taking once daily). I suggested she stop amitiza and monitor effect.       Chronic insomnia    Continue trazodone 25mg  nightly      Community acquired pneumonia of right upper lobe of lung ([place])    Endorses h/o repeated PNA infections, latest 11/2016. Pneumococcal vaccination is up to date (last pneumovax done 2010). Based on guidelines, she is not due for rpt vaccination until age 49. However, if recurrent infection low threshold to re vaccinate.       COPD (chronic obstructive pulmonary disease) (HCC)    Severe oxygen dependent COPD in continued smoker. She continues attempts at smoking cessation. Continue spiriva and symbicort controllers and ventolin inh PRN.       Health maintenance examination - Primary    Preventative protocols reviewed and updated unless pt declined. Discussed healthy diet and lifestyle.       Relevant Orders   Cytology - PAP [place]   HLD (hyperlipidemia)    Mild off statin. Low 10 yr risk. Continue to encourage smoking cessation.  The 10-year ASCVD risk score Mikey Bussing DC Brooke Bonito., et al., 2013) is: 5.5%   Values used to calculate the score:     Age: 56 years     Sex: Female     Is Non-Hispanic African American: No     Diabetic: No     Tobacco smoker: Yes     Systolic Blood Pressure: 253 mmHg     Is BP treated: No     HDL Cholesterol: 43.6 mg/dL     Total Cholesterol: 207 mg/dL       Positive hepatitis C antibody test    Anticipate prior infection that she has since cleared. Discussed with patient.       Right-sided low back pain with right-sided sciatica    Benign exam. Will treat with low dose NSAID with renal and GI precautions. Avoid controlled substances. rec heating pad/ice to back and gentle stretching.       Relevant Medications   naproxen (NAPROSYN) 375 MG tablet   Tobacco abuse    Continue to encourage cessation. She has been able to  decrease from 3 ppd to 1 ppd with chantix assistance. Reviewed chantix administration. Continue       Relevant Orders   DG Bone Density    Other Visit Diagnoses    Need for influenza vaccination       Relevant Orders   Flu Vaccine QUAD 6+ mos PF IM (Fluarix Quad PF) (Completed)   History of abnormal mammogram       Relevant Orders   MM Digital Diagnostic Bilat   Estrogen deficiency       Relevant Orders   DG Bone Density       Follow up plan: Return in about 4 months (around 06/23/2017) for follow up visit.  Ria Bush, MD

## 2017-02-21 NOTE — Assessment & Plan Note (Addendum)
Diffuse ATH of thoracic aorta and high grade stenosis of L subclavian artery.  Discussed aspirin. Not on statin. Continue to encourage ongoing attempts at smoking cessation.

## 2017-02-23 DIAGNOSIS — M5441 Lumbago with sciatica, right side: Secondary | ICD-10-CM | POA: Insufficient documentation

## 2017-02-23 DIAGNOSIS — R768 Other specified abnormal immunological findings in serum: Secondary | ICD-10-CM | POA: Insufficient documentation

## 2017-02-23 DIAGNOSIS — R7689 Other specified abnormal immunological findings in serum: Secondary | ICD-10-CM | POA: Insufficient documentation

## 2017-02-23 NOTE — Assessment & Plan Note (Signed)
Benign exam. Will treat with low dose NSAID with renal and GI precautions. Avoid controlled substances. rec heating pad/ice to back and gentle stretching.

## 2017-02-23 NOTE — Assessment & Plan Note (Signed)
Continue trazodone 25mg  nightly

## 2017-02-23 NOTE — Assessment & Plan Note (Signed)
Endorses h/o repeated PNA infections, latest 11/2016. Pneumococcal vaccination is up to date (last pneumovax done 2010). Based on guidelines, she is not due for rpt vaccination until age 56. However, if recurrent infection low threshold to re vaccinate.

## 2017-02-23 NOTE — Assessment & Plan Note (Signed)
Anticipate prior infection that she has since cleared. Discussed with patient.

## 2017-02-23 NOTE — Assessment & Plan Note (Signed)
Severe oxygen dependent COPD in continued smoker. She continues attempts at smoking cessation. Continue spiriva and symbicort controllers and ventolin inh PRN.

## 2017-02-23 NOTE — Assessment & Plan Note (Signed)
Endorses some intermittent diarrhea on linzess and amitiza (taking once daily). I suggested she stop amitiza and monitor effect.

## 2017-02-23 NOTE — Assessment & Plan Note (Signed)
Continue to encourage cessation. She has been able to decrease from 3 ppd to 1 ppd with chantix assistance. Reviewed chantix administration. Continue

## 2017-02-25 LAB — CYTOLOGY - PAP
DIAGNOSIS: NEGATIVE
HPV (WINDOPATH): NOT DETECTED

## 2017-02-26 ENCOUNTER — Telehealth: Payer: Self-pay | Admitting: Family Medicine

## 2017-02-26 NOTE — Telephone Encounter (Signed)
Patient Name: Christine Serrano DOB: [birth year] Initial Comment CB: [phone] Caller says she had her physical done on Friday and she is now having right lung pain. She has a lot of pain when breathing (shortness of breath) even when she is taking a normal breath, Says she may have pneumonia and her right lung has collapsed before Nurse Assessment Nurse: Vallery Sa, RN, Tye [place] Date/Time (Eastern Time): 02/26/2017 12:47:14 PM Confirm and document reason for call. If symptomatic, describe symptoms. ---Manuela Schwartz states she is not having severe shortness of breath or blueness around her lips. She developed right chest pain 3-4 days ago (pain rated as a 7 on the 1 to 10 scale). No injury. She is on oxygen for COPD and thinks she may have another collapsed lung. Alert and responsive. Does the patient have any new or worsening symptoms? ---Yes Will a triage be completed? ---Yes Related visit to physician within the last 2 weeks? ---No Does the PT have any chronic conditions? (i.e. diabetes, asthma, etc.) ---Yes List chronic conditions. ---COPD, Depression, Anxiety Is this a behavioral health or substance abuse call? ---No Guidelines Guideline Title Affirmed Question Affirmed Notes Chest Pain SEVERE chest pain Final Disposition User Go to ED Now Vallery Sa, RN, [place] Comments Gwendelyn declined the Go to ER disposition. Reinforced the Go to ER disposition. She states she just wants to be seen at the office. Called the office backline and notified Rollene Fare. Connected Laresha with Rollene Fare. Referrals GO TO FACILITY REFUSED Disagree/Comply: Disagree Disagree/Comply Reason: Disagree with instructions

## 2017-02-26 NOTE — Telephone Encounter (Addendum)
Regina did you triage pt?

## 2017-02-26 NOTE — Telephone Encounter (Signed)
Patientt was transferred to me by [facility] because patient refused to go to the ER. Spoke to patient and  was advised that she does not feel that her breathing is compromised and would go to the ER if she feels that she needs to. Advised patient that Dr. Danise Mina is out of the office today.  Advised patient that I would talk with another  doctor to see if she can be worked in today. Dr. Glori Bickers agreed to see patient at 4:15 today and Jeani Hawking called patient to get this scheduled. See note by Jeani Hawking.

## 2017-02-26 NOTE — Telephone Encounter (Signed)
I spoke with PT and offered her a same day appt with Dr Glori Bickers at 4:15pm. She said she does not have a ride today but can come tomorrow. I made her a 8am appt with Dr Danise Mina, her PCP. She said she understands the seriousness of her symptoms and will call EMS if symptoms get worse.

## 2017-02-26 NOTE — Telephone Encounter (Signed)
Pt has appt 02/27/17 at 8 AM with Dr Darnell Level.

## 2017-02-27 ENCOUNTER — Ambulatory Visit (INDEPENDENT_AMBULATORY_CARE_PROVIDER_SITE_OTHER)
Admission: RE | Admit: 2017-02-27 | Discharge: 2017-02-27 | Disposition: A | Discharge status: Home / Self Care | Payer: Medicare HMO | Source: Ambulatory Visit | Attending: Family Medicine | Admitting: Family Medicine

## 2017-02-27 ENCOUNTER — Ambulatory Visit (INDEPENDENT_AMBULATORY_CARE_PROVIDER_SITE_OTHER): Payer: Medicare HMO | Admitting: Family Medicine

## 2017-02-27 ENCOUNTER — Encounter: Payer: Self-pay | Admitting: Family Medicine

## 2017-02-27 VITALS — BP 116/62 | HR 95 | Temp 97.9°F | Wt 123.2 lb

## 2017-02-27 DIAGNOSIS — R079 Chest pain, unspecified: Secondary | ICD-10-CM | POA: Diagnosis not present

## 2017-02-27 DIAGNOSIS — R05 Cough: Secondary | ICD-10-CM | POA: Diagnosis not present

## 2017-02-27 DIAGNOSIS — J441 Chronic obstructive pulmonary disease with (acute) exacerbation: Secondary | ICD-10-CM

## 2017-02-27 MED ORDER — DOXYCYCLINE MONOHYDRATE 100 MG PO TABS: 100.0000 mg | ORAL_TABLET | Freq: Two times a day (BID) | ORAL | 0 refills | Status: DC

## 2017-02-27 MED ORDER — PREDNISONE 20 MG PO TABS
ORAL_TABLET | ORAL | 0 refills | Status: DC
Start: 2017-02-27 — End: 2017-06-05

## 2017-02-27 NOTE — Assessment & Plan Note (Signed)
Concern for rpt COPD exacerbation. Good air movement throughout, doubt rpt PTX. Check CXR to r/o PNA.  Rx doxy/prednisone, continue duonebs, continue regular spiriva and symbicort Update if not improving with treatment. I did encourage f/u with pulm as overdue.

## 2017-02-27 NOTE — Progress Notes (Signed)
BP 116/62 (BP Location: Left Arm, Patient Position: Sitting, Cuff Size: Normal)   Pulse 95   Temp 97.9 F (36.6 C) (Oral)   Wt 123 lb 4 oz (55.9 kg)   SpO2 94% Comment: 3 L  BMI 21.49 kg/m    CC: dyspnea, chest pain Subjective:    Patient ID: Christine Serrano, female    DOB: 04-09-1961, 56 y.o.   MRN: [id number]  HPI: Christine Serrano is a 56 y.o. female presenting on 02/27/2017 for Shortness of Breath (Also, right lung pain. Started about 3 days ago)   Seen last week for physical.  Presents today with 3d h/o R sharp upper flank pain with deep breaths as well as increased dyspnea despite her 3 L pulsed O2 by [place]. Increased cough, increased sputum production as well.   Denies fevers/chills, hemoptysis, head congestion, headaches. Denies dizziness or chest pain.   H/o R PTX 1990s.  Pulmonologist - Dr Alva Garnet. Due for f/u Continues 1ppd - this is decrease from 3 ppd - she continues chantix.   Last COPD exacerbation along with RUL PNA 11/2016, resolved after doxy/prednisone course.   Relevant past medical, surgical, family and social history reviewed and updated as indicated. Interim medical history since our last visit reviewed. Allergies and medications reviewed and updated. Outpatient Medications Prior to Visit  Medication Sig Dispense Refill  . AMITIZA 24 MCG capsule     . aspirin EC 81 MG tablet Take 81 mg by mouth daily.    . benzonatate (TESSALON) 100 MG capsule Take 1 capsule (100 mg total) by mouth 3 (three) times daily as needed for cough. 30 capsule 0  . Cholecalciferol (VITAMIN D) 2000 units tablet Take 2,000 Units by mouth daily.    . hydrOXYzine (ATARAX/VISTARIL) 25 MG tablet Take 1 tablet (25 mg total) by mouth 2 (two) times daily as needed for anxiety. 180 tablet 1  . ipratropium-albuterol (DUONEB) 0.5-2.5 (3) MG/3ML SOLN Inhale 3 mLs into the lungs every 6 (six) hours as needed. 360 mL 3  . LINZESS 290 MCG CAPS capsule Take 1 capsule (290 mcg total) by mouth daily.  90 capsule 1  . Multiple Vitamins-Minerals (MULTIVITAMIN ADULT) TABS Take 1 tablet by mouth daily.    . naproxen (NAPROSYN) 375 MG tablet Take 1 tablet twice daily with meals for 5 days then twice daily as needed 40 tablet 0  . sertraline (ZOLOFT) 50 MG tablet Take 1 tablet (50 mg total) by mouth daily. 90 tablet 1  . SYMBICORT 160-4.5 MCG/ACT inhaler Inhale 2 puffs into the lungs 2 (two) times daily. 3 Inhaler 4  . tiotropium (SPIRIVA) 18 MCG inhalation capsule Place 1 capsule (18 mcg total) into inhaler and inhale daily. 90 capsule 4  . traZODone (DESYREL) 50 MG tablet Take 25 mg by mouth at bedtime.     . varenicline (CHANTIX CONTINUING MONTH PAK) 1 MG tablet Take 1 tablet (1 mg total) by mouth 2 (two) times daily. 60 tablet 5  . varenicline (CHANTIX STARTING MONTH PAK) 0.5 MG X 11 & 1 MG X 42 tablet Take one 0.5 mg tablet by mouth once daily for 3 days, then increase to one 0.5 mg tablet twice daily for 4 days, then increase to one 1 mg tablet twice daily. 53 tablet 0  . VENTOLIN HFA 108 (90 Base) MCG/ACT inhaler Inhale 2 puffs into the lungs every 6 (six) hours as needed for shortness of breath. 6 Inhaler 4  . vitamin B-12 (CYANOCOBALAMIN) 1000 MCG tablet Take  1,000 mcg by mouth daily.    . vitamin E 400 UNIT capsule Take 400 Units by mouth daily.      No facility-administered medications prior to visit.      Per HPI unless specifically indicated in ROS section below Review of Systems     Objective:    BP 116/62 (BP Location: Left Arm, Patient Position: Sitting, Cuff Size: Normal)   Pulse 95   Temp 97.9 F (36.6 C) (Oral)   Wt 123 lb 4 oz (55.9 kg)   SpO2 94% Comment: 3 L  BMI 21.49 kg/m   Wt Readings from Last 3 Encounters:  02/27/17 123 lb 4 oz (55.9 kg)  02/21/17 120 lb 4 oz (54.5 kg)  02/05/17 122 lb 8 oz (55.6 kg)    Physical Exam  Constitutional: She appears well-developed and well-nourished. No distress.  3L [place] by pulse ox  HENT:  Head: Normocephalic and atraumatic.   Mouth/Throat: Oropharynx is clear and moist. No oropharyngeal exudate.  Cardiovascular: Normal rate, regular rhythm, normal heart sounds and intact distal pulses.   No murmur heard. Pulmonary/Chest: Effort normal. No respiratory distress. She has decreased breath sounds. She has wheezes. She has rhonchi. She has no rales.  Faint exp wheezing  Coarse throughout  Musculoskeletal: She exhibits no edema.  Skin: Skin is warm and dry. No rash noted. No erythema.  No vesicular rash  Psychiatric: She has a normal mood and affect.  Nursing note and vitals reviewed.  Results for orders placed or performed in visit on 02/21/17  Cytology - PAP [place]  Result Value Ref Range   Adequacy      Satisfactory for evaluation  endocervical/transformation zone component PRESENT.   Diagnosis      NEGATIVE FOR INTRAEPITHELIAL LESIONS OR MALIGNANCY.   HPV NOT DETECTED    Material Submitted CervicoVaginal Pap [ThinPrep Imaged]       Assessment & Plan:   Problem List Items Addressed This Visit    COPD with acute exacerbation ([place]) - Primary    Concern for rpt COPD exacerbation. Good air movement throughout, doubt rpt PTX. Check CXR to r/o PNA.  Rx doxy/prednisone, continue duonebs, continue regular spiriva and symbicort Update if not improving with treatment. I did encourage f/u with pulm as overdue.       Relevant Medications   predniSONE (DELTASONE) 20 MG tablet   Other Relevant Orders   DG Chest 2 View       Follow up plan: Return if symptoms worsen or fail to improve.  Ria Bush, MD

## 2017-02-27 NOTE — Patient Instructions (Signed)
I do think you have COPD flare - treat with doxycycline and prednisone course. Continue regular duonebs.  Seek urgent care if fever >101, worsening productive cough or trouble breathing.  Return if not improving with treatment.  Schedule follow up visit with Dr Alva Garnet today.

## 2017-03-04 ENCOUNTER — Telehealth: Payer: Self-pay | Admitting: Family Medicine

## 2017-03-04 DIAGNOSIS — R928 Other abnormal and inconclusive findings on diagnostic imaging of breast: Secondary | ICD-10-CM

## 2017-03-04 NOTE — Telephone Encounter (Signed)
[facility] needs mammogram order changed to IMG [id number] and they need ultra sounds added  IMG 5532 and IMG 5531 Thank you

## 2017-03-04 NOTE — Telephone Encounter (Signed)
Orders updated

## 2017-03-10 ENCOUNTER — Telehealth: Payer: Self-pay | Admitting: Family Medicine

## 2017-03-10 NOTE — Telephone Encounter (Signed)
leftmessage asking pt to call office  °

## 2017-03-10 NOTE — Telephone Encounter (Signed)
Left message asking pt to call office  °

## 2017-03-13 DIAGNOSIS — J449 Chronic obstructive pulmonary disease, unspecified: Secondary | ICD-10-CM | POA: Diagnosis not present

## 2017-03-19 DIAGNOSIS — Z1212 Encounter for screening for malignant neoplasm of rectum: Secondary | ICD-10-CM | POA: Diagnosis not present

## 2017-03-19 DIAGNOSIS — Z1211 Encounter for screening for malignant neoplasm of colon: Secondary | ICD-10-CM | POA: Diagnosis not present

## 2017-03-19 LAB — COLOGUARD: COLOGUARD: POSITIVE

## 2017-03-21 DIAGNOSIS — J449 Chronic obstructive pulmonary disease, unspecified: Secondary | ICD-10-CM | POA: Diagnosis not present

## 2017-03-24 ENCOUNTER — Telehealth: Payer: Self-pay

## 2017-03-24 NOTE — Telephone Encounter (Signed)
Rx written and in CMA box 

## 2017-03-24 NOTE — Telephone Encounter (Signed)
Pt left v/m; pt uses 3 L of oxygen and the unit pt has is overheating due to pt using more oxygen; pt request order for larger oxygen concentrator that will go up to 10 L of oxygen. Pt presently has a 5 L maximum oxygen concentrator and has overheat more than once since increased to 3 L.pt request order  faxed to [facility] fax # [phone] attn; referral support  Pt request cb.

## 2017-03-25 MED ORDER — GABAPENTIN 100 MG PO CAPS: ORAL_CAPSULE | ORAL | 3 refills | Status: DC

## 2017-03-25 MED ORDER — SERTRALINE HCL 100 MG PO TABS
100.0000 mg | ORAL_TABLET | Freq: Every day | ORAL | 1 refills | Status: DC
Start: 2017-03-25 — End: 2017-07-07

## 2017-03-25 NOTE — Telephone Encounter (Signed)
Spoke with pt relaying message per Dr. Darnell Level. Says ok and expresses her thanks.

## 2017-03-25 NOTE — Telephone Encounter (Signed)
Faxed order to [facility]. Spoke with Christine Serrano notifying order was faxed. Christine Serrano requests to resume gabapentin due tremors, or if there is something else Dr. Darnell Level wants to prescribe. Also, she requests an increase of Zoloft.

## 2017-03-25 NOTE — Telephone Encounter (Signed)
May try gabapentin sent in. zoloft 100mg  sent in.

## 2017-04-12 DIAGNOSIS — J449 Chronic obstructive pulmonary disease, unspecified: Secondary | ICD-10-CM | POA: Diagnosis not present

## 2017-04-16 ENCOUNTER — Encounter: Payer: Self-pay | Admitting: Family Medicine

## 2017-04-16 ENCOUNTER — Telehealth: Payer: Self-pay | Admitting: Family Medicine

## 2017-04-16 DIAGNOSIS — R195 Other fecal abnormalities: Secondary | ICD-10-CM

## 2017-04-16 NOTE — Telephone Encounter (Signed)
plz notify - cologuard test returned positive. Recommend GI referral to discuss colonoscopy.  Order placed.

## 2017-04-16 NOTE — Telephone Encounter (Signed)
Left message for pt to call back  °

## 2017-04-16 NOTE — Telephone Encounter (Signed)
Left message asking pt to call office regarding scheduling mammogram

## 2017-04-18 ENCOUNTER — Other Ambulatory Visit: Payer: Self-pay | Admitting: Family Medicine

## 2017-04-18 ENCOUNTER — Other Ambulatory Visit: Payer: Self-pay | Admitting: *Deleted

## 2017-04-18 ENCOUNTER — Encounter: Payer: Self-pay | Admitting: Internal Medicine

## 2017-04-18 ENCOUNTER — Inpatient Hospital Stay
Admission: RE | Admit: 2017-04-18 | Discharge: 2017-04-18 | Disposition: A | Discharge status: Home / Self Care | Payer: Self-pay | Source: Ambulatory Visit | Attending: *Deleted | Admitting: *Deleted

## 2017-04-18 DIAGNOSIS — Z9289 Personal history of other medical treatment: Secondary | ICD-10-CM

## 2017-04-18 DIAGNOSIS — Z1231 Encounter for screening mammogram for malignant neoplasm of breast: Secondary | ICD-10-CM

## 2017-04-18 NOTE — Telephone Encounter (Signed)
Spoke with pt relaying results of Cologauard and message per Dr. Darnell Level. Pt says ok.

## 2017-04-21 DIAGNOSIS — J449 Chronic obstructive pulmonary disease, unspecified: Secondary | ICD-10-CM | POA: Diagnosis not present

## 2017-05-07 ENCOUNTER — Ambulatory Visit
Admission: RE | Admit: 2017-05-07 | Discharge: 2017-05-07 | Disposition: A | Discharge status: Home / Self Care | Payer: Medicare HMO | Source: Ambulatory Visit | Attending: Family Medicine | Admitting: Family Medicine

## 2017-05-07 DIAGNOSIS — Z1231 Encounter for screening mammogram for malignant neoplasm of breast: Secondary | ICD-10-CM | POA: Diagnosis not present

## 2017-05-13 ENCOUNTER — Telehealth: Payer: Self-pay | Admitting: *Deleted

## 2017-05-13 ENCOUNTER — Ambulatory Visit: Payer: Self-pay | Admitting: Family Medicine

## 2017-05-13 NOTE — Telephone Encounter (Signed)
Copied from [place]. Topic: Quick Communication - Appointment Cancellation >> May 13, 2017 12:29 PM Burnis Medin, NT wrote: Patient called to cancel appointment scheduled for today @ 2pm with Dr. Danise Mina. Pt is going to reschedule.    Route to department's PEC pool.

## 2017-05-13 NOTE — Telephone Encounter (Signed)
Please advised if patient should be charged a late fee cancellation? Patient has rescheduled appointment for 05/19/17.

## 2017-05-13 NOTE — Telephone Encounter (Signed)
If patient cancels, ok to not charge cancellation fee.

## 2017-05-19 ENCOUNTER — Ambulatory Visit: Payer: Self-pay | Admitting: Family Medicine

## 2017-05-19 DIAGNOSIS — J449 Chronic obstructive pulmonary disease, unspecified: Secondary | ICD-10-CM | POA: Diagnosis not present

## 2017-05-20 ENCOUNTER — Other Ambulatory Visit: Payer: Self-pay | Admitting: Family Medicine

## 2017-05-21 DIAGNOSIS — J449 Chronic obstructive pulmonary disease, unspecified: Secondary | ICD-10-CM | POA: Diagnosis not present

## 2017-05-23 ENCOUNTER — Telehealth: Payer: Self-pay | Admitting: *Deleted

## 2017-05-23 NOTE — Telephone Encounter (Signed)
This pt has a complicated med hx and she is on 3L 02- pt needs an OV prior to a colonoscopy  Left message to return call for [place] to make OVLelan Pons [place]

## 2017-05-30 ENCOUNTER — Ambulatory Visit: Payer: Self-pay | Admitting: Pulmonary Disease

## 2017-06-05 ENCOUNTER — Encounter: Payer: Self-pay | Admitting: Physician Assistant

## 2017-06-05 ENCOUNTER — Ambulatory Visit (INDEPENDENT_AMBULATORY_CARE_PROVIDER_SITE_OTHER): Payer: Medicare HMO | Admitting: Physician Assistant

## 2017-06-05 VITALS — BP 104/64 | HR 104 | Ht 63.5 in | Wt 122.2 lb

## 2017-06-05 DIAGNOSIS — R195 Other fecal abnormalities: Secondary | ICD-10-CM | POA: Diagnosis not present

## 2017-06-05 DIAGNOSIS — K5909 Other constipation: Secondary | ICD-10-CM | POA: Diagnosis not present

## 2017-06-05 DIAGNOSIS — Z9981 Dependence on supplemental oxygen: Secondary | ICD-10-CM | POA: Diagnosis not present

## 2017-06-05 DIAGNOSIS — J9611 Chronic respiratory failure with hypoxia: Secondary | ICD-10-CM | POA: Diagnosis not present

## 2017-06-05 MED ORDER — NA SULFATE-K SULFATE-MG SULF 17.5-3.13-1.6 GM/177ML PO SOLN: 1.0000 | ORAL | 0 refills | Status: DC

## 2017-06-05 NOTE — Progress Notes (Signed)
Agree with assessment and plan. Colonoscopy recommended given positive cologuard, needs to be done at the hospital given oxygen use. On an atypical regimen for constipation, but if it works and she tolerates it, can continue for now

## 2017-06-05 NOTE — Patient Instructions (Signed)

## 2017-06-05 NOTE — Progress Notes (Signed)
Chief Complaint: Positive Cologuard  HPI:    Christine Serrano is a 56 year old female with a past medical history as listed below including COPD on chronic oxygen at 3 L, who was referred to me by Ria Bush, MD for a complaint of a positive Cologuard test.      Per review of chart patient had positive Cologuard resulted 03/19/17.    Today, the patient presents to clinic and explains that she has been on chronic oxygen at 3 L for the past year due to her COPD.  Patient denies any history of heart attacks or trouble with anesthesia in the past.  Patient has never had a colonoscopy and was hoping not to have one now but is aware that due to her positive Cologuard she needs to have a screening.    Patient does describe she has chronic constipation for which she uses a 24 mcg Amitiza in the morning and a 290 mcg Linzess at night.  She has been doing this for a few years and they seem to be working well.  Her insurance is giving her some difficulty with Amitiza but she is working with her primary care provider to try to work through this.    Patient denies fever, chills, blood in her stool, melena, weight loss, fatigue, anorexia, change in bowel habits or abdominal pain.     Past Medical History:  Diagnosis Date  . Chronic idiopathic constipation   . COPD (chronic obstructive pulmonary disease) ([place])   . Depression   . Endometriosis     Past Surgical History:  Procedure Laterality Date  . BREAST EXCISIONAL BIOPSY Left 1978   benign  . BREAST SURGERY Left    benign biopsy - chronic fatty deposit  . EXPLORATORY LAPAROTOMY  1992   endometriosis  . FOOT SURGERY    . [place]   remote  . SALPINGOOPHORECTOMY Left 1983   ectopic pregnancy    Current Outpatient Medications  Medication Sig Dispense Refill  . AMITIZA 24 MCG capsule     . aspirin EC 81 MG tablet Take 81 mg by mouth daily.    . benzonatate (TESSALON) 100 MG capsule Take 1 capsule (100 mg total) by mouth 3  (three) times daily as needed for cough. 30 capsule 0  . Cholecalciferol (VITAMIN D) 2000 units tablet Take 2,000 Units by mouth daily.    Marland Kitchen doxycycline (ADOXA) 100 MG tablet Take 1 tablet (100 mg total) by mouth 2 (two) times daily. 20 tablet 0  . gabapentin (NEURONTIN) 100 MG capsule Take one tablet nightly for 4 days then twice daily for 4 days then three times daily 90 capsule 3  . hydrOXYzine (ATARAX/VISTARIL) 25 MG tablet TAKE 1 TABLET (25 MG TOTAL) BY MOUTH 2 (TWO) TIMES DAILY AS NEEDED FOR ANXIETY. 180 tablet 2  . ipratropium-albuterol (DUONEB) 0.5-2.5 (3) MG/3ML SOLN Inhale 3 mLs into the lungs every 6 (six) hours as needed. 360 mL 3  . LINZESS 290 MCG CAPS capsule Take 1 capsule (290 mcg total) by mouth daily. 90 capsule 1  . Multiple Vitamins-Minerals (MULTIVITAMIN ADULT) TABS Take 1 tablet by mouth daily.    . naproxen (NAPROSYN) 375 MG tablet Take 1 tablet twice daily with meals for 5 days then twice daily as needed 40 tablet 0  . sertraline (ZOLOFT) 100 MG tablet Take 1 tablet (100 mg total) by mouth daily. 90 tablet 1  . SYMBICORT 160-4.5 MCG/ACT inhaler Inhale 2 puffs into the lungs 2 (two)  times daily. 3 Inhaler 4  . tiotropium (SPIRIVA) 18 MCG inhalation capsule Place 1 capsule (18 mcg total) into inhaler and inhale daily. 90 capsule 4  . traZODone (DESYREL) 50 MG tablet Take 25 mg by mouth at bedtime.     . varenicline (CHANTIX CONTINUING MONTH PAK) 1 MG tablet Take 1 tablet (1 mg total) by mouth 2 (two) times daily. 60 tablet 5  . varenicline (CHANTIX STARTING MONTH PAK) 0.5 MG X 11 & 1 MG X 42 tablet Take one 0.5 mg tablet by mouth once daily for 3 days, then increase to one 0.5 mg tablet twice daily for 4 days, then increase to one 1 mg tablet twice daily. 53 tablet 0  . VENTOLIN HFA 108 (90 Base) MCG/ACT inhaler Inhale 2 puffs into the lungs every 6 (six) hours as needed for shortness of breath. 6 Inhaler 4  . vitamin B-12 (CYANOCOBALAMIN) 1000 MCG tablet Take 1,000 mcg by  mouth daily.    . vitamin E 400 UNIT capsule Take 400 Units by mouth daily.      No current facility-administered medications for this visit.     Allergies as of 06/05/2017 - Review Complete 06/05/2017  Allergen Reaction Noted  . Imitrex [sumatriptan] Other (See Comments) 09/27/2015    Family History  Problem Relation Age of Onset  . Heart disease Mother   . Hypertension Mother   . Hypertension Father   . Hearing loss Maternal Grandmother   . Hypertension Maternal Grandmother   . Hearing loss Maternal Grandfather   . Hypertension Maternal Grandfather   . Colon cancer Paternal Grandmother     Social History   Socioeconomic History  . Marital status: Legally Separated    Spouse name: Not on file  . Number of children: Not on file  . Years of education: Not on file  . Highest education level: Not on file  Social Needs  . Financial resource strain: Not on file  . Food insecurity - worry: Not on file  . Food insecurity - inability: Not on file  . Transportation needs - medical: Not on file  . Transportation needs - non-medical: Not on file  Occupational History  . Not on file  Tobacco Use  . Smoking status: Current Every Day Smoker    Packs/day: 1.00    Years: 47.00    Pack years: 47.00    Types: Cigarettes  . Smokeless tobacco: Never Used  Substance and Sexual Activity  . Alcohol use: Yes    Comment: occasional  . Drug use: No  . Sexual activity: No  Other Topics Concern  . 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)   Activity: limited by dyspnea   Diet: some water daily, fruits/vegetables daily    Review of Systems:    Constitutional: No weight loss, fever or chills Skin: No rash  Cardiovascular: No chest pain Respiratory: Chronic SOB Gastrointestinal: See HPI and otherwise negative Genitourinary: No dysuria  Neurological: No  headache Musculoskeletal: No new muscle or joint pain Hematologic: No bleeding  Psychiatric: No history of depression or anxiety    Physical Exam:  Vital signs: BP 104/64 (BP Location: Right Arm, Patient Position: Sitting, Cuff Size: Normal)   Pulse (!) 104   Ht 5' 3.5" (1.613 m) Comment: height measured without shoes  Wt 122 lb 4 oz (55.5 kg)   BMI 21.32 kg/m  Constitutional:   Pleasant Caucasian female appears to be in NAD, Well developed, Well nourished, alert and cooperative, on o2 via [place] at 3L Head:  Normocephalic and atraumatic. Eyes:   PEERL, EOMI. No icterus. Conjunctiva pink. Ears:  Normal auditory acuity. Neck:  Supple Throat: Oral cavity and pharynx without inflammation, swelling or lesion.  Respiratory: Respirations even and unlabored. Lungs clear to auscultation bilaterally.   No wheezes, crackles, or rhonchi.  Cardiovascular: Normal S1, S2. No MRG. Regular rate and rhythm. No peripheral edema, cyanosis or pallor.  Gastrointestinal:  Soft, nondistended, nontender. No rebound or guarding. Normal bowel sounds. No appreciable masses or hepatomegaly. Rectal:  Not performed.  Msk:  Symmetrical without gross deformities. Without edema, no deformity or joint abnormality.  Neurologic:  Alert and  oriented x4;  grossly normal neurologically.  Skin:   Dry and intact without significant lesions or rashes. Psychiatric: Demonstrates good judgement and reason without abnormal affect or behaviors.  No recent labs or imaging.  Assessment: 1. Positive Cologuard: in October, needs colonoscopy 2. Chronic O2 use: 3L with COPD 3. Chronic Constipation: maintained on Amitiza 65mcg qam and Linzess 256mcg qhs  Plan: 1. Scheduled patient for a colonoscopy in the hospital due to chronic O2 use with Dr. Havery Moros. Discussed risks, benefits, limitations and alternatives. Patient agrees to proceed. 2.  Patient to follow in clinic per recommendations from Dr. Havery Moros after time of  procedure  Ellouise Newer, PA-C [facility] 06/05/2017, 10:11 AM  Cc: Ria Bush, MD

## 2017-06-16 ENCOUNTER — Other Ambulatory Visit: Payer: Self-pay | Admitting: Pulmonary Disease

## 2017-06-16 MED ORDER — SYMBICORT 160-4.5 MCG/ACT IN AERO: 2.0000 | INHALATION_SPRAY | Freq: Two times a day (BID) | RESPIRATORY_TRACT | 4 refills | Status: DC

## 2017-06-16 NOTE — Telephone Encounter (Signed)
Patient has f/u scheduled 06/23/17. Symbicort 160 has been refilled [employer].Marland Kitchen

## 2017-06-21 DIAGNOSIS — J449 Chronic obstructive pulmonary disease, unspecified: Secondary | ICD-10-CM | POA: Diagnosis not present

## 2017-06-23 ENCOUNTER — Encounter: Payer: Self-pay | Admitting: Pulmonary Disease

## 2017-06-23 ENCOUNTER — Ambulatory Visit (INDEPENDENT_AMBULATORY_CARE_PROVIDER_SITE_OTHER): Payer: Medicare HMO | Admitting: Pulmonary Disease

## 2017-06-23 VITALS — BP 116/68 | HR 97 | Ht 63.5 in | Wt 125.0 lb

## 2017-06-23 DIAGNOSIS — J439 Emphysema, unspecified: Secondary | ICD-10-CM | POA: Diagnosis not present

## 2017-06-23 DIAGNOSIS — J9611 Chronic respiratory failure with hypoxia: Secondary | ICD-10-CM | POA: Diagnosis not present

## 2017-06-23 DIAGNOSIS — J449 Chronic obstructive pulmonary disease, unspecified: Secondary | ICD-10-CM | POA: Diagnosis not present

## 2017-06-23 DIAGNOSIS — F17200 Nicotine dependence, unspecified, uncomplicated: Secondary | ICD-10-CM

## 2017-06-23 MED ORDER — VARENICLINE TARTRATE 1 MG PO TABS: 1.0000 mg | ORAL_TABLET | Freq: Two times a day (BID) | ORAL | 5 refills | Status: DC

## 2017-06-23 MED ORDER — VARENICLINE TARTRATE 0.5 MG X 11 & 1 MG X 42 PO MISC: ORAL | 0 refills | Status: DC

## 2017-06-23 NOTE — Progress Notes (Signed)
PULMONARY OFFICE FOLLOW UP  PT PROFILE: 61 F recalcitrant smoker with severe, oxygen-dependent COPD hospitalized 10/24-10/26/17 for COPD exacerbation initially requiring NIPPV  PROBLEMS: Recalcitrant smoker Chronic hypoxic respiratory failure Severe emphysema Chronic bronchitis  DATA: CT chest 03/13/16: Severe emphysema with extensive disease in the upper lobes and superior segment of the right lower lobe. This could represent acute and/or chronic changes. In addition, there is an irregular spiculated density along the right minor fissure which is indeterminate.  INTERVAL:  No major events   SUBJ: Last seen by me in  April 2018.  Since that time, she has cut down from 3 packs of cigarettes per day to approximately 1 pack/day.  She believes that her breathing has improved as a result of this.  However, she has been unable to quit altogether.  She continues to have moderate exertional dyspnea.  She continues to have chronic rattling cough with no significant sputum production.  She denies hemoptysis and chest pain.  She denies lower extremity edema and calf tenderness.  She wishes to try Chantix to assist her in smoking cessation.  OBJ: Vitals:   06/23/17 0938 06/23/17 0944  BP:  116/68  Pulse:  97  SpO2:  96%  Weight: 56.7 kg (125 lb)   Height: 5' 3.5" (1.613 m)   3 LPM [place] pulse   Gen: Flat affect, no overt distress HEENT: No acute finding Neck: No LAN or JVD noted Lungs: decreased BS throughout, few scattered wheezes Cardiovascular: Reg, no M noted Abdomen: Soft, NT +BS Ext: no C/C/E Neuro: grossly intact Skin: No lesions noted  CXR (02/27/17): Severe hyperinflation with chronic  biapical scarring  IMPRESSION: Very severe COPD/emphysema/chronic bronchitis Chronic hypoxic respiratory failure Recalcitrant smoker -wishes to try Chantix  PLAN: Continue Symbicort, Spiriva, albuterol as needed, oxygen therapy as previously prescribed Chantix starter pack and continuation  pack ordered Smoking cessation was discussed in detail including strategies, potential benefit ROV 3 months with CXR and PFTs prior to that visit    Merton Border, MD PCCM service Mobile [phone] Pager [phone] 06/23/2017 8:40 PM

## 2017-06-23 NOTE — Patient Instructions (Signed)
Continue same medications and oxygen Chantix starter pak and continuation pak ordered Smoking cessation discussed Follow up in 3 months with Chest Xray and PFTs (lung function tests) to be done prior to next visit

## 2017-06-25 ENCOUNTER — Encounter: Payer: Self-pay | Admitting: Internal Medicine

## 2017-07-07 ENCOUNTER — Ambulatory Visit (INDEPENDENT_AMBULATORY_CARE_PROVIDER_SITE_OTHER): Payer: Medicare HMO | Admitting: Family Medicine

## 2017-07-07 ENCOUNTER — Encounter: Payer: Self-pay | Admitting: Family Medicine

## 2017-07-07 VITALS — BP 118/62 | HR 85 | Temp 98.2°F | Wt 125.0 lb

## 2017-07-07 DIAGNOSIS — Z720 Tobacco use: Secondary | ICD-10-CM | POA: Diagnosis not present

## 2017-07-07 DIAGNOSIS — F5104 Psychophysiologic insomnia: Secondary | ICD-10-CM

## 2017-07-07 DIAGNOSIS — F419 Anxiety disorder, unspecified: Secondary | ICD-10-CM | POA: Diagnosis not present

## 2017-07-07 DIAGNOSIS — F331 Major depressive disorder, recurrent, moderate: Secondary | ICD-10-CM

## 2017-07-07 DIAGNOSIS — K5909 Other constipation: Secondary | ICD-10-CM | POA: Diagnosis not present

## 2017-07-07 MED ORDER — SERTRALINE HCL 100 MG PO TABS: 150.0000 mg | ORAL_TABLET | Freq: Every day | ORAL | 1 refills | Status: DC

## 2017-07-07 MED ORDER — BENZONATATE 100 MG PO CAPS: 100.0000 mg | ORAL_CAPSULE | Freq: Two times a day (BID) | ORAL | 3 refills | Status: DC | PRN

## 2017-07-07 NOTE — Patient Instructions (Addendum)
Back off trazodone - change to only as needed for sleep.  Decrease dose of hydroxyzine to 1/2 tablet as needed for anxiety.  Increase sertraline to 150mg  daily.  Follow up in 4-6 wks.  We will refer you to counselor.

## 2017-07-07 NOTE — Assessment & Plan Note (Addendum)
Continues considering smoking cessation.  Previous chantix helped her decrease smoking from 3 ppd to 1 ppd

## 2017-07-07 NOTE — Assessment & Plan Note (Signed)
See above. Decrease dose of hydroxyzine due to oversedation .

## 2017-07-07 NOTE — Assessment & Plan Note (Signed)
amitiza no longer covered by insurance. Pt will stop and just continue linzess.

## 2017-07-07 NOTE — Assessment & Plan Note (Signed)
Improved but worsening anxiety.  Anticipate some of sedation from medications - will decrease trazodone to 25mg  QHS PRN (from nightly) and decrease hydroxyzine to 1/2 dose 12.5mg  PRN anxiety (from 25mg  tablet). Will also increase sertraline to 150mg  daily and refer for counseling. If sertraline ineffective, consider adjuvant medication (?wellbutrin).  RTC 4-6 wks f/u visit.   PHQ9 = 15 GAD7 = 14

## 2017-07-07 NOTE — Progress Notes (Signed)
BP 118/62 (BP Location: Left Arm, Patient Position: Sitting, Cuff Size: Normal)   Pulse 85   Temp 98.2 F (36.8 C) (Oral)   Wt 125 lb (56.7 kg)   SpO2 95% Comment: 3 L  BMI 21.80 kg/m    CC: anxiety/depression Subjective:    Patient ID: Christine Serrano, female    DOB: [birth year], 57 y.o.   MRN: [id number]  HPI: Christine Serrano is a 57 y.o. female presenting on 07/07/2017 for Depression (C/o fatigue and just wants to sleep) and Anxiety (Having a lot of panic attacks)   Waking up tired. Uses oxygen at night time. She does take naps during the day. She takes trazodone '25mg'$  nightly.   Noticing increased anxiety with anxiety attacks. Descries jittery, nervous, scared. This wakes her up at night. Takes hydroxyzine '25mg'$  regularly 3-4 times a day.   Stressed - having to put down 57 yo german shepard. Has dealt with a lot of loss in last 2.5 years (father, fiance, BIL, SIL). Interested in counseling. Struggling with guilt over fiance's suicide.   Current mood disorder regimen is hydroxyzine '25mg'$  BID PRN anxiety as well as sertraline '100mg'$  daily and trazodone '25mg'$  nightly. Prior on effexor - sertraline is more effective.   Relevant past medical, surgical, family and social history reviewed and updated as indicated. Interim medical history since our last visit reviewed. Allergies and medications reviewed and updated. Outpatient Medications Prior to Visit  Medication Sig Dispense Refill  . aspirin EC 81 MG tablet Take 81 mg by mouth daily.    . Cholecalciferol (VITAMIN D) 2000 units tablet Take 2,000 Units by mouth daily.    Marland Kitchen gabapentin (NEURONTIN) 100 MG capsule Take one tablet nightly for 4 days then twice daily for 4 days then three times daily 90 capsule 3  . hydrOXYzine (ATARAX/VISTARIL) 25 MG tablet Take 0.5 tablets (12.5 mg total) by mouth 2 (two) times daily as needed for anxiety.    Marland Kitchen ipratropium-albuterol (DUONEB) 0.5-2.5 (3) MG/3ML SOLN Inhale 3 mLs into the lungs every 6 (six)  hours as needed. 360 mL 3  . LINZESS 290 MCG CAPS capsule Take 1 capsule (290 mcg total) by mouth daily. 90 capsule 1  . Multiple Vitamins-Minerals (MULTIVITAMIN ADULT) TABS Take 1 tablet by mouth daily.    . Na Sulfate-K Sulfate-Mg Sulf (SUPREP BOWEL PREP KIT) 17.5-3.13-1.6 GM/177ML SOLN Take 1 kit by mouth as directed. 324 mL 0  . naproxen (NAPROSYN) 375 MG tablet Take 1 tablet twice daily with meals for 5 days then twice daily as needed 40 tablet 0  . OXYGEN Inhale 3 L/min into the lungs continuous.    . SYMBICORT 160-4.5 MCG/ACT inhaler Inhale 2 puffs into the lungs 2 (two) times daily. 3 Inhaler 4  . tiotropium (SPIRIVA) 18 MCG inhalation capsule Place 1 capsule (18 mcg total) into inhaler and inhale daily. 90 capsule 4  . traZODone (DESYREL) 50 MG tablet Take 0.5 tablets (25 mg total) by mouth at bedtime as needed for sleep.    . varenicline (CHANTIX CONTINUING MONTH PAK) 1 MG tablet Take 1 tablet (1 mg total) by mouth 2 (two) times daily. 60 tablet 5  . varenicline (CHANTIX PAK) 0.5 MG X 11 & 1 MG X 42 tablet Take one 0.5 mg tablet by mouth once daily for 3 days, then increase to one 0.5 mg tablet twice daily for 4 days, then increase to one 1 mg tablet twice daily. 53 tablet 0  . VENTOLIN HFA 108 (90 Base)  MCG/ACT inhaler Inhale 2 puffs into the lungs every 6 (six) hours as needed for shortness of breath. 6 Inhaler 4  . vitamin B-12 (CYANOCOBALAMIN) 1000 MCG tablet Take 1,000 mcg by mouth daily.    . vitamin E 400 UNIT capsule Take 400 Units by mouth daily.     . AMITIZA 24 MCG capsule     . benzonatate (TESSALON) 100 MG capsule Take 1 capsule (100 mg total) by mouth 3 (three) times daily as needed for cough. (Patient taking differently: Take 100 mg by mouth as needed for cough. ) 30 capsule 0  . hydrOXYzine (ATARAX/VISTARIL) 25 MG tablet TAKE 1 TABLET (25 MG TOTAL) BY MOUTH 2 (TWO) TIMES DAILY AS NEEDED FOR ANXIETY. 180 tablet 2  . sertraline (ZOLOFT) 100 MG tablet Take 1 tablet (100 mg  total) by mouth daily. 90 tablet 1  . traZODone (DESYREL) 50 MG tablet Take 25 mg by mouth at bedtime.      No facility-administered medications prior to visit.      Per HPI unless specifically indicated in ROS section below Review of Systems     Objective:    BP 118/62 (BP Location: Left Arm, Patient Position: Sitting, Cuff Size: Normal)   Pulse 85   Temp 98.2 F (36.8 C) (Oral)   Wt 125 lb (56.7 kg)   SpO2 95% Comment: 3 L  BMI 21.80 kg/m   Wt Readings from Last 3 Encounters:  07/07/17 125 lb (56.7 kg)  06/23/17 125 lb (56.7 kg)  06/05/17 122 lb 4 oz (55.5 kg)    Physical Exam  Constitutional: She appears well-nourished. No distress.  [place] in place with supplemental O2 at 3L  Psychiatric: She has a normal mood and affect. Her behavior is normal. Judgment and thought content normal.  Tearful with discussion of stressors and guilt  Nursing note and vitals reviewed.  Results for orders placed or performed in visit on 04/16/17  Cologuard  Result Value Ref Range   Cologuard Positive       Assessment & Plan:   Problem List Items Addressed This Visit    Anxiety    See above. Decrease dose of hydroxyzine due to oversedation .       Relevant Medications   hydrOXYzine (ATARAX/VISTARIL) 25 MG tablet   sertraline (ZOLOFT) 100 MG tablet   traZODone (DESYREL) 50 MG tablet   Other Relevant Orders   Ambulatory referral to Psychology   Chronic constipation    amitiza no longer covered by insurance. Pt will stop and just continue linzess.       Chronic insomnia    Now oversedation and over sleeping - change trazodone to QHS PRN.       Moderate recurrent major depression ([place]) - Primary    Improved but worsening anxiety.  Anticipate some of sedation from medications - will decrease trazodone to '25mg'$  QHS PRN (from nightly) and decrease hydroxyzine to 1/2 dose 12.'5mg'$  PRN anxiety (from '25mg'$  tablet). Will also increase sertraline to '150mg'$  daily and refer for counseling. If  sertraline ineffective, consider adjuvant medication (?wellbutrin).  RTC 4-6 wks f/u visit.   PHQ9 = 15 GAD7 = 14      Relevant Medications   hydrOXYzine (ATARAX/VISTARIL) 25 MG tablet   sertraline (ZOLOFT) 100 MG tablet   traZODone (DESYREL) 50 MG tablet   Other Relevant Orders   Ambulatory referral to Psychology   Tobacco abuse    Continues considering smoking cessation.  Previous chantix helped her decrease smoking from 3 ppd  to 1 ppd          Follow up plan: Return in about 6 weeks (around 08/18/2017) for follow up visit.  Ria Bush, MD

## 2017-07-07 NOTE — Assessment & Plan Note (Signed)
Now oversedation and over sleeping - change trazodone to QHS PRN.

## 2017-07-11 ENCOUNTER — Ambulatory Visit (INDEPENDENT_AMBULATORY_CARE_PROVIDER_SITE_OTHER): Payer: Medicare HMO | Admitting: Psychology

## 2017-07-11 DIAGNOSIS — F331 Major depressive disorder, recurrent, moderate: Secondary | ICD-10-CM

## 2017-07-17 ENCOUNTER — Ambulatory Visit (INDEPENDENT_AMBULATORY_CARE_PROVIDER_SITE_OTHER): Payer: Medicare HMO | Admitting: Psychology

## 2017-07-17 DIAGNOSIS — F331 Major depressive disorder, recurrent, moderate: Secondary | ICD-10-CM

## 2017-07-18 ENCOUNTER — Encounter (HOSPITAL_COMMUNITY): Payer: Self-pay | Admitting: *Deleted

## 2017-07-18 ENCOUNTER — Other Ambulatory Visit: Payer: Self-pay

## 2017-07-22 DIAGNOSIS — J440 Chronic obstructive pulmonary disease with acute lower respiratory infection: Secondary | ICD-10-CM | POA: Diagnosis not present

## 2017-07-22 DIAGNOSIS — J449 Chronic obstructive pulmonary disease, unspecified: Secondary | ICD-10-CM | POA: Diagnosis not present

## 2017-07-25 ENCOUNTER — Ambulatory Visit: Payer: Medicare HMO | Admitting: Psychology

## 2017-07-31 ENCOUNTER — Ambulatory Visit: Payer: Medicare HMO | Admitting: Psychology

## 2017-08-04 ENCOUNTER — Encounter (HOSPITAL_COMMUNITY)
Admission: RE | Disposition: A | Discharge status: Home / Self Care | Payer: Self-pay | Source: Ambulatory Visit | Attending: Gastroenterology

## 2017-08-04 ENCOUNTER — Ambulatory Visit (HOSPITAL_COMMUNITY): Payer: Medicare HMO | Admitting: Registered Nurse

## 2017-08-04 ENCOUNTER — Encounter (HOSPITAL_COMMUNITY): Payer: Self-pay

## 2017-08-04 ENCOUNTER — Ambulatory Visit (HOSPITAL_COMMUNITY)
Admission: RE | Admit: 2017-08-04 | Discharge: 2017-08-04 | Disposition: A | Discharge status: Home / Self Care | Payer: Medicare HMO | Source: Ambulatory Visit | Attending: Gastroenterology | Admitting: Gastroenterology

## 2017-08-04 ENCOUNTER — Other Ambulatory Visit: Payer: Self-pay

## 2017-08-04 DIAGNOSIS — D127 Benign neoplasm of rectosigmoid junction: Secondary | ICD-10-CM | POA: Insufficient documentation

## 2017-08-04 DIAGNOSIS — R195 Other fecal abnormalities: Secondary | ICD-10-CM | POA: Diagnosis present

## 2017-08-04 DIAGNOSIS — D126 Benign neoplasm of colon, unspecified: Secondary | ICD-10-CM | POA: Diagnosis not present

## 2017-08-04 DIAGNOSIS — F419 Anxiety disorder, unspecified: Secondary | ICD-10-CM | POA: Insufficient documentation

## 2017-08-04 DIAGNOSIS — Z79899 Other long term (current) drug therapy: Secondary | ICD-10-CM | POA: Insufficient documentation

## 2017-08-04 DIAGNOSIS — D123 Benign neoplasm of transverse colon: Secondary | ICD-10-CM

## 2017-08-04 DIAGNOSIS — D125 Benign neoplasm of sigmoid colon: Secondary | ICD-10-CM | POA: Diagnosis not present

## 2017-08-04 DIAGNOSIS — Z888 Allergy status to other drugs, medicaments and biological substances status: Secondary | ICD-10-CM | POA: Diagnosis not present

## 2017-08-04 DIAGNOSIS — F17210 Nicotine dependence, cigarettes, uncomplicated: Secondary | ICD-10-CM | POA: Diagnosis not present

## 2017-08-04 DIAGNOSIS — D122 Benign neoplasm of ascending colon: Secondary | ICD-10-CM

## 2017-08-04 DIAGNOSIS — E785 Hyperlipidemia, unspecified: Secondary | ICD-10-CM | POA: Diagnosis not present

## 2017-08-04 DIAGNOSIS — J449 Chronic obstructive pulmonary disease, unspecified: Secondary | ICD-10-CM | POA: Insufficient documentation

## 2017-08-04 DIAGNOSIS — Z9981 Dependence on supplemental oxygen: Secondary | ICD-10-CM | POA: Diagnosis not present

## 2017-08-04 DIAGNOSIS — K5904 Chronic idiopathic constipation: Secondary | ICD-10-CM | POA: Diagnosis not present

## 2017-08-04 DIAGNOSIS — Q438 Other specified congenital malformations of intestine: Secondary | ICD-10-CM | POA: Insufficient documentation

## 2017-08-04 DIAGNOSIS — F329 Major depressive disorder, single episode, unspecified: Secondary | ICD-10-CM | POA: Insufficient documentation

## 2017-08-04 DIAGNOSIS — J9611 Chronic respiratory failure with hypoxia: Secondary | ICD-10-CM

## 2017-08-04 DIAGNOSIS — K5730 Diverticulosis of large intestine without perforation or abscess without bleeding: Secondary | ICD-10-CM | POA: Insufficient documentation

## 2017-08-04 DIAGNOSIS — Z7982 Long term (current) use of aspirin: Secondary | ICD-10-CM | POA: Diagnosis not present

## 2017-08-04 DIAGNOSIS — K5520 Angiodysplasia of colon without hemorrhage: Secondary | ICD-10-CM | POA: Diagnosis not present

## 2017-08-04 DIAGNOSIS — Z8541 Personal history of malignant neoplasm of cervix uteri: Secondary | ICD-10-CM | POA: Diagnosis not present

## 2017-08-04 DIAGNOSIS — K5909 Other constipation: Secondary | ICD-10-CM

## 2017-08-04 HISTORY — PX: COLONOSCOPY WITH PROPOFOL: SHX5780

## 2017-08-04 SURGERY — COLONOSCOPY WITH PROPOFOL
Anesthesia: Monitor Anesthesia Care

## 2017-08-04 MED ORDER — LACTATED RINGERS IV SOLN
INTRAVENOUS | Status: DC
  Administered 2017-08-04: 1000 mL via INTRAVENOUS

## 2017-08-04 MED ORDER — SODIUM CHLORIDE 0.9 % IV SOLN: INTRAVENOUS | Status: DC

## 2017-08-04 MED ORDER — ALBUTEROL SULFATE HFA 108 (90 BASE) MCG/ACT IN AERS
INHALATION_SPRAY | RESPIRATORY_TRACT | Status: DC | PRN
  Administered 2017-08-04: 2 via RESPIRATORY_TRACT

## 2017-08-04 MED ORDER — PROPOFOL 10 MG/ML IV BOLUS
INTRAVENOUS | Status: AC
  Filled 2017-08-04: qty 20

## 2017-08-04 MED ORDER — PROPOFOL 10 MG/ML IV BOLUS
INTRAVENOUS | Status: AC
Start: 2017-08-04 — End: 2017-08-04
  Filled 2017-08-04: qty 20

## 2017-08-04 MED ORDER — PROPOFOL 500 MG/50ML IV EMUL
INTRAVENOUS | Status: DC | PRN
  Administered 2017-08-04: 130 ug/kg/min via INTRAVENOUS

## 2017-08-04 MED ORDER — PROPOFOL 10 MG/ML IV BOLUS
INTRAVENOUS | Status: DC | PRN
  Administered 2017-08-04 (×2): 20 mg via INTRAVENOUS

## 2017-08-04 MED ORDER — LIDOCAINE 2% (20 MG/ML) 5 ML SYRINGE
INTRAMUSCULAR | Status: DC | PRN
  Administered 2017-08-04: 50 mg via INTRAVENOUS

## 2017-08-04 MED ORDER — PHENYLEPHRINE 40 MCG/ML (10ML) SYRINGE FOR IV PUSH (FOR BLOOD PRESSURE SUPPORT)
PREFILLED_SYRINGE | INTRAVENOUS | Status: DC | PRN
  Administered 2017-08-04 (×2): 80 ug via INTRAVENOUS
  Administered 2017-08-04: 40 ug via INTRAVENOUS

## 2017-08-04 MED ORDER — ONDANSETRON HCL 4 MG/2ML IJ SOLN
INTRAMUSCULAR | Status: DC | PRN
  Administered 2017-08-04: 4 mg via INTRAVENOUS

## 2017-08-04 SURGICAL SUPPLY — 21 items

## 2017-08-04 NOTE — Discharge Instructions (Signed)

## 2017-08-04 NOTE — H&P (Signed)
HPI:   Christine Serrano is a 57 y.o. female here for first time colonoscopy. She has had a positive Cologuard test leading to this exam. She has chronic constipation, otherwise no symptoms. She has a history of COPD and is on supplemental oxygen. She denies any breathing issues today.   Past Medical History:  Diagnosis Date  . Cervical cancer ([place])   . Chronic idiopathic constipation   . COPD (chronic obstructive pulmonary disease) ([place])   . Depression   . Endometriosis   . HLD (hyperlipidemia)     Past Surgical History:  Procedure Laterality Date  . ABLATION ON ENDOMETRIOSIS    . APPENDECTOMY    . BREAST BIOPSY Left    benign biopsy - chronic fatty deposit  . CERVICAL ABLATION    . EXPLORATORY LAPAROTOMY  1992   endometriosis  . FOOT SURGERY Right    needle imbeded  . LASER ABLATION CONDYLOMA CERVICAL / VULVAR    . NASAL SEPTUM SURGERY Bilateral   . OVARIAN CYST SURGERY Bilateral 1980   remote  . SALPINGOOPHORECTOMY Right 1983   ectopic pregnancy  . TONSILLECTOMY      Family History  Problem Relation Age of Onset  . Heart disease Mother   . Hypertension Mother   . Hypertension Father   . COPD Father   . Hearing loss Maternal Grandmother   . Hypertension Maternal Grandmother   . Hearing loss Maternal Grandfather   . Hypertension Maternal Grandfather   . Colon cancer Paternal Grandmother     Social History   Tobacco Use  . Smoking status: Current Every Day Smoker    Packs/day: 1.00    Years: 47.00    Pack years: 47.00    Types: Cigarettes  . Smokeless tobacco: Never Used  Substance Use Topics  . Alcohol use: Yes    Comment: occasional  . Drug use: No    Prior to Admission medications   Medication Sig Start Date End Date Taking? Authorizing Provider  aspirin EC 81 MG tablet Take 81 mg by mouth daily.   Yes [provider]  benzonatate (TESSALON) 100 MG capsule Take 1 capsule (100 mg total) by mouth 2 (two) times daily as needed for  cough. 07/07/17  Yes Ria Bush, MD  Cholecalciferol (VITAMIN D) 2000 units tablet Take 2,000 Units by mouth daily.   Yes [provider]  gabapentin (NEURONTIN) 100 MG capsule Take one tablet nightly for 4 days then twice daily for 4 days then three times daily Patient taking differently: Take 100 mg by mouth 3 (three) times daily.  03/25/17  Yes Ria Bush, MD  hydrOXYzine (ATARAX/VISTARIL) 25 MG tablet Take 0.5 tablets (12.5 mg total) by mouth 2 (two) times daily as needed for anxiety. 07/07/17  Yes Ria Bush, MD  ipratropium-albuterol (DUONEB) 0.5-2.5 (3) MG/3ML SOLN Inhale 3 mLs into the lungs every 6 (six) hours as needed. Patient taking differently: Inhale 3 mLs into the lungs 3 (three) times daily.  09/09/16  Yes Wilhelmina Mcardle, MD  LINZESS 290 MCG CAPS capsule Take 1 capsule (290 mcg total) by mouth daily. 02/06/17  Yes Ria Bush, MD  Multiple Vitamins-Minerals (MULTIVITAMIN ADULT) TABS Take 1 tablet by mouth daily.   Yes [provider]  Na Sulfate-K Sulfate-Mg Sulf (SUPREP BOWEL PREP KIT) 17.5-3.13-1.6 GM/177ML SOLN Take 1 kit by mouth as directed. 06/05/17  Yes Levin Erp, PA  naproxen (NAPROSYN) 375 MG tablet Take 1  tablet twice daily with meals for 5 days then twice daily as needed Patient taking differently: Take 375 mg by mouth 2 (two) times daily as needed (for pain.).  02/21/17  Yes Ria Bush, MD  OXYGEN Inhale 3 L/min into the lungs continuous.   Yes [provider]  sertraline (ZOLOFT) 100 MG tablet Take 1.5 tablets (150 mg total) by mouth daily. 07/07/17  Yes Ria Bush, MD  SYMBICORT 160-4.5 MCG/ACT inhaler Inhale 2 puffs into the lungs 2 (two) times daily. 06/16/17  Yes Wilhelmina Mcardle, MD  tiotropium (SPIRIVA) 18 MCG inhalation capsule Place 1 capsule (18 mcg total) into inhaler and inhale daily. 09/17/16  Yes Wilhelmina Mcardle, MD  traZODone (DESYREL) 50 MG tablet Take 0.5 tablets (25 mg total) by  mouth at bedtime as needed for sleep. 07/07/17  Yes Ria Bush, MD  varenicline (CHANTIX CONTINUING MONTH PAK) 1 MG tablet Take 1 tablet (1 mg total) by mouth 2 (two) times daily. 06/23/17  Yes Wilhelmina Mcardle, MD  varenicline (CHANTIX PAK) 0.5 MG X 11 & 1 MG X 42 tablet Take one 0.5 mg tablet by mouth once daily for 3 days, then increase to one 0.5 mg tablet twice daily for 4 days, then increase to one 1 mg tablet twice daily. 06/23/17  Yes Wilhelmina Mcardle, MD  VENTOLIN HFA 108 (90 Base) MCG/ACT inhaler Inhale 2 puffs into the lungs every 6 (six) hours as needed for shortness of breath. 09/17/16  Yes Wilhelmina Mcardle, MD  vitamin B-12 (CYANOCOBALAMIN) 1000 MCG tablet Take 1,000 mcg by mouth daily.   Yes [provider]  vitamin E 400 UNIT capsule Take 400 Units by mouth daily.    Yes [provider]    Current Facility-Administered Medications  Medication Dose Route Frequency Provider Last Rate Last Dose  . 0.9 %  sodium chloride infusion   Intravenous Continuous Levin Erp, [place]        Allergies as of 06/16/2017 - Review Complete 06/05/2017  Allergen Reaction Noted  . Imitrex [sumatriptan] Other (See Comments) 09/27/2015     Review of Systems:    As per HPI, otherwise negative    Physical Exam:  Vital signs in last 24 hours:     General:   Pleasant female in NAD Lungs:  Respirations even and unlabored. Some scattered course BS B   Heart:  Regular rate and rhythm;  Abdomen:  Soft, nondistended, nontender. No appreciable masses  Extremities:  Without edema.  LAB RESULTS: No results for input(s): WBC, HGB, HCT, PLT in the last 72 hours. BMET No results for input(s): NA, K, CL, CO2, GLUCOSE, BUN, CREATININE, CALCIUM in the last 72 hours. LFT No results for input(s): PROT, ALBUMIN, AST, ALT, ALKPHOS, BILITOT, BILIDIR, IBILI in the last 72 hours. PT/INR No results for input(s): LABPROT, INR in the last 72 hours.  STUDIES: No results found.     Impression / Plan:   57 y/o female with a positive Cologuard, here for first time colonoscopy. Case being done at [facility] due to anesthesia requirements, given her history of oxygen dependant COPD. I discussed what a Cologuard is, implications of this exam, and recommendation for colonoscopy. I discussed risks / benefits of anesthesia and colonoscopy, and she wished to proceed. Further recommendations pending the results.   Potter Cellar, MD [facility] Pager [phone]  =

## 2017-08-04 NOTE — Op Note (Signed)
[facility] Patient Name: Christine Serrano Procedure Date: 08/04/2017 MRN: [id number] Attending MD: Carlota Raspberry. Massimo Hartland MD, MD Date of Birth: Nov 15, 1960 CSN: [id number] Age: 57 Admit Type: Outpatient Procedure:                Colonoscopy Indications:              Positive Cologuard test Providers:                Remo Lipps P. Nyelah Emmerich MD, MD, Burtis Junes, RN, Elspeth Cho Tech., Technician, Charolette Child,                            Technician, Courtney Heys. [place], CRNA Referring MD:              Medicines:                Monitored Anesthesia Care Complications:            No immediate complications. Estimated blood loss:                            Minimal. Estimated Blood Loss:     Estimated blood loss was minimal. Procedure:                Pre-Anesthesia Assessment:                           - Prior to the procedure, a History and Physical                            was performed, and patient medications and                            allergies were reviewed. The patient's tolerance of                            previous anesthesia was also reviewed. The risks                            and benefits of the procedure and the sedation                            options and risks were discussed with the patient.                            All questions were answered, and informed consent                            was obtained. Prior Anticoagulants: The patient has                            taken no previous anticoagulant or antiplatelet                            agents. ASA  Grade Assessment: III - A patient with                            severe systemic disease. After reviewing the risks                            and benefits, the patient was deemed in                            satisfactory condition to undergo the procedure.                           After obtaining informed consent, the colonoscope                            was passed  under direct vision. Throughout the                            procedure, the patient's blood pressure, pulse, and                            oxygen saturations were monitored continuously. The                            EC-[id number] ([id number]) scope was introduced through                            the anus and advanced to the the terminal ileum,                            with identification of the appendiceal orifice and                            IC valve. The colonoscopy was performed without                            difficulty. The patient tolerated the procedure                            well. The quality of the bowel preparation was                            adequate. The terminal ileum, ileocecal valve,                            appendiceal orifice, and rectum were photographed. Scope In: 11:17:41 AM Scope Out: 11:50:30 AM Scope Withdrawal Time: 0 hours 27 minutes 17 seconds  Total Procedure Duration: 0 hours 32 minutes 49 seconds  Findings:      The perianal and digital rectal examinations were normal.      The terminal ileum appeared normal.      A single medium-sized angiodysplastic lesion was found in the ascending       colon.      A 4 to 5 mm polyp was found  in the ascending colon. The polyp was       sessile. The polyp was removed with a cold snare. Resection and       retrieval were complete.      A 3 mm polyp was found in the transverse colon. The polyp was sessile.       The polyp was removed with a cold snare. Resection and retrieval were       complete.      A 5 mm polyp was found in the sigmoid colon. The polyp was sessile. The       polyp was removed with a cold snare. Resection and retrieval were       complete.      A 3 mm polyp was found in the recto-sigmoid colon. The polyp was       sessile. The polyp was removed with a cold snare. Resection and       retrieval were complete.      A few small-mouthed diverticula were found in the sigmoid colon.      The  colon was tortuous.      The exam was otherwise without abnormality. Impression:               - The examined portion of the ileum was normal.                           - A single colonic angiodysplastic lesion.                           - One 4 to 5 mm polyp in the ascending colon,                            removed with a cold snare. Resected and retrieved.                           - One 3 mm polyp in the transverse colon, removed                            with a cold snare. Resected and retrieved.                           - One 5 mm polyp in the sigmoid colon, removed with                            a cold snare. Resected and retrieved.                           - One 3 mm polyp at the recto-sigmoid colon,                            removed with a cold snare. Resected and retrieved.                           - Diverticulosis in the sigmoid colon.                           - Tortuous colon.                           -  The examination was otherwise normal. Moderate Sedation:      No moderate sedation, case performed with MAC Recommendation:           - Patient has a contact number available for                            emergencies. The signs and symptoms of potential                            delayed complications were discussed with the                            patient. Return to normal activities tomorrow.                            Written discharge instructions were provided to the                            patient.                           - Resume previous diet.                           - Continue present medications.                           - Await pathology results.                           - Repeat colonoscopy is recommended for                            surveillance. The colonoscopy date will be                            determined after pathology results from today's                            exam become available for review.                           - No  ibuprofen, naproxen, or other non-steroidal                            anti-inflammatory drugs for 2 weeks after polyp                            removal. Procedure Code(s):        --- Professional ---                           [phone], Colonoscopy, flexible; with removal of                            tumor(s), polyp(s), or other lesion(s) by snare  technique Diagnosis Code(s):        --- Professional ---                           K55.20, Angiodysplasia of colon without hemorrhage                           D12.2, Benign neoplasm of ascending colon                           D12.3, Benign neoplasm of transverse colon (hepatic                            flexure or splenic flexure)                           D12.5, Benign neoplasm of sigmoid colon                           D12.7, Benign neoplasm of rectosigmoid junction                           R19.5, Other fecal abnormalities                           K57.30, Diverticulosis of large intestine without                            perforation or abscess without bleeding                           Q43.8, Other specified congenital malformations of                            intestine CPT copyright 2016 American Medical Association. All rights reserved. The codes documented in this report are preliminary and upon coder review may  be revised to meet current compliance requirements. Remo Lipps P. Yavonne Kiss MD, MD 08/04/2017 11:56:07 AM This report has been signed electronically. Number of Addenda: 0

## 2017-08-04 NOTE — Interval H&P Note (Signed)
History and Physical Interval Note:  08/04/2017 10:56 AM  Christine Serrano  has presented today for surgery, with the diagnosis of positive cologard/O2 dependency/db  The various methods of treatment have been discussed with the patient and family. After consideration of risks, benefits and other options for treatment, the patient has consented to  Procedure(s): COLONOSCOPY WITH PROPOFOL (N/A) as a surgical intervention .  The patient's history has been reviewed, patient examined, no change in status, stable for surgery.  I have reviewed the patient's chart and labs.  Questions were answered to the patient's satisfaction.     [place]

## 2017-08-04 NOTE — Anesthesia Preprocedure Evaluation (Signed)
Anesthesia Evaluation  Patient identified by MRN, date of birth, ID band Patient awake    Reviewed: Allergy & Precautions, NPO status , Patient's Chart, lab work & pertinent test results  Airway Mallampati: I  TM Distance: >3 FB Neck ROM: Full    Dental  (+) Upper Dentures, Lower Dentures, Dental Advisory Given   Pulmonary COPD,  COPD inhaler, Current Smoker,     + wheezing      Cardiovascular negative cardio ROS Normal cardiovascular exam     Neuro/Psych PSYCHIATRIC DISORDERS Anxiety Depression negative neurological ROS  negative psych ROS   GI/Hepatic negative GI ROS, Neg liver ROS,   Endo/Other  negative endocrine ROS  Renal/GU negative Renal ROS  negative genitourinary   Musculoskeletal negative musculoskeletal ROS (+)   Abdominal   Peds negative pediatric ROS (+)  Hematology negative hematology ROS (+)   Anesthesia Other Findings   Reproductive/Obstetrics negative OB ROS                             Anesthesia Physical Anesthesia Plan  ASA: III  Anesthesia Plan: MAC   Post-op Pain Management:    Induction:   PONV Risk Score and Plan: 2 and Ondansetron and Propofol infusion  Airway Management Planned: Natural Airway  Additional Equipment:   Intra-op Plan:   Post-operative Plan:   Informed Consent: I have reviewed the patients History and Physical, chart, labs and discussed the procedure including the risks, benefits and alternatives for the proposed anesthesia with the patient or authorized representative who has indicated his/her understanding and acceptance.   Dental advisory given  Plan Discussed with: CRNA, Surgeon and Anesthesiologist  Anesthesia Plan Comments:         Anesthesia Quick Evaluation

## 2017-08-04 NOTE — Transfer of Care (Signed)
Immediate Anesthesia Transfer of Care Note  Patient: Christine Serrano  Procedure(s) Performed: COLONOSCOPY WITH PROPOFOL (N/A )  Patient Location: PACU and Endoscopy Unit  Anesthesia Type:MAC  Level of Consciousness: awake, alert , oriented and patient cooperative  Airway & Oxygen Therapy: Patient Spontanous Breathing and Patient connected to face mask oxygen  Post-op Assessment: Report given to RN, Post -op Vital signs reviewed and stable and Patient moving all extremities  Post vital signs: Reviewed and stable  Last Vitals:  Vitals:   08/04/17 1053 08/04/17 1157  BP: 101/76 95/71  Pulse: 95 88  Resp: 18 18  Temp: 36.6 C   SpO2: 100% 100%    Last Pain:  Vitals:   08/04/17 1157  TempSrc: Oral         Complications: No apparent anesthesia complications

## 2017-08-04 NOTE — Anesthesia Postprocedure Evaluation (Signed)
Anesthesia Post Note  Patient: Christine Serrano  Procedure(s) Performed: COLONOSCOPY WITH PROPOFOL (N/A )     Patient location during evaluation: PACU Anesthesia Type: MAC Level of consciousness: awake and alert Pain management: pain level controlled Vital Signs Assessment: post-procedure vital signs reviewed and stable Respiratory status: spontaneous breathing and respiratory function stable Cardiovascular status: stable Postop Assessment: no apparent nausea or vomiting Anesthetic complications: no    Last Vitals:  Vitals:   08/04/17 1210 08/04/17 1220  BP: (!) 124/93 (!) 141/82  Pulse: (!) 54 98  Resp: (!) 22 (!) 23  Temp:    SpO2: 100% 100%    Last Pain:  Vitals:   08/04/17 1157  TempSrc: Oral                 Ela Moffat DANIEL

## 2017-08-05 ENCOUNTER — Encounter: Payer: Self-pay | Admitting: Gastroenterology

## 2017-08-06 ENCOUNTER — Encounter: Payer: Self-pay | Admitting: Family Medicine

## 2017-08-06 ENCOUNTER — Ambulatory Visit (INDEPENDENT_AMBULATORY_CARE_PROVIDER_SITE_OTHER): Payer: Medicare HMO | Admitting: Psychology

## 2017-08-06 DIAGNOSIS — F331 Major depressive disorder, recurrent, moderate: Secondary | ICD-10-CM | POA: Diagnosis not present

## 2017-08-07 ENCOUNTER — Encounter (HOSPITAL_COMMUNITY): Payer: Self-pay | Admitting: Gastroenterology

## 2017-08-14 ENCOUNTER — Ambulatory Visit: Payer: Medicare HMO | Admitting: Psychology

## 2017-08-15 ENCOUNTER — Ambulatory Visit: Payer: Medicare HMO | Admitting: Psychology

## 2017-08-18 ENCOUNTER — Encounter: Payer: Self-pay | Admitting: Family Medicine

## 2017-08-18 ENCOUNTER — Ambulatory Visit (INDEPENDENT_AMBULATORY_CARE_PROVIDER_SITE_OTHER): Payer: Medicare HMO | Admitting: Family Medicine

## 2017-08-18 VITALS — BP 120/82 | HR 103 | Temp 98.0°F | Wt 120.0 lb

## 2017-08-18 DIAGNOSIS — Z720 Tobacco use: Secondary | ICD-10-CM

## 2017-08-18 DIAGNOSIS — J019 Acute sinusitis, unspecified: Secondary | ICD-10-CM | POA: Diagnosis not present

## 2017-08-18 DIAGNOSIS — F419 Anxiety disorder, unspecified: Secondary | ICD-10-CM

## 2017-08-18 DIAGNOSIS — F331 Major depressive disorder, recurrent, moderate: Secondary | ICD-10-CM | POA: Diagnosis not present

## 2017-08-18 MED ORDER — SYMBICORT 160-4.5 MCG/ACT IN AERO: 2.0000 | INHALATION_SPRAY | Freq: Two times a day (BID) | RESPIRATORY_TRACT | 4 refills | Status: DC

## 2017-08-18 MED ORDER — AMOXICILLIN-POT CLAVULANATE 875-125 MG PO TABS: 1.0000 | ORAL_TABLET | Freq: Two times a day (BID) | ORAL | 0 refills | Status: AC

## 2017-08-18 MED ORDER — FLUTICASONE PROPIONATE 50 MCG/ACT NA SUSP: 2.0000 | Freq: Every day | NASAL | 3 refills | Status: DC

## 2017-08-18 MED ORDER — NAPROXEN 375 MG PO TABS: 375.0000 mg | ORAL_TABLET | Freq: Two times a day (BID) | ORAL | 0 refills | Status: DC | PRN

## 2017-08-18 NOTE — Patient Instructions (Addendum)
For sinus congestion - possible sinus infection - start flonase 2 sprays into each nose daily for 1-2 weeks. If not improving or any worsening of sinus pain, fever, congestion, fill augmentin antibiotic printed out today. Continue nasal saline spray as needed.  Continue current doses of trazodone and sertraline. I'm glad you are doing better! Return as needed or in 6 months for physical (after 02/21/2018).  See our referral coordinators to schedule DEXA scan.

## 2017-08-18 NOTE — Assessment & Plan Note (Signed)
Doing better on lower hydroxyzine

## 2017-08-18 NOTE — Progress Notes (Signed)
BP 120/82 (BP Location: Left Arm, Patient Position: Sitting, Cuff Size: Normal)   Pulse (!) 103   Temp 98 F (36.7 C) (Oral)   Wt 120 lb (54.4 kg)   SpO2 95% Comment: 3 L, pulsating  BMI 20.92 kg/m    CC: 6 wk f/u visit Subjective:    Patient ID: Christine Serrano, female    DOB: Jul 20, 1960, 57 y.o.   MRN: [id number]  HPI: Christine Serrano is a 57 y.o. female presenting on 08/18/2017 for Depression (Here for follow-up) and Anxiety (Here for follow-up)   See prior note for details. Last visit seen with worsening anxiety due to stressors of family illnesses. We decreased trazodone and hydroxyzine due to oversedation. We also increased sertraline to 120m daily and referred to counseling. [facility]. This has significantly helped. Lower trazodone and hydroxyzine doses have also helped with daytime somnolence.   3-4 wk h/o increased nasal congestion, sinus pressure. Feels trouble getting oxygen. She has been using nasal saline spray. Sometimes gets bloody discharge with sputum production. No recent abx use. Denies fevers/chills. Ongoing chronic cough, no change.   Relevant past medical, surgical, family and social history reviewed and updated as indicated. Interim medical history since our last visit reviewed. Allergies and medications reviewed and updated. Outpatient Medications Prior to Visit  Medication Sig Dispense Refill  . aspirin EC 81 MG tablet Take 81 mg by mouth daily.    . benzonatate (TESSALON) 100 MG capsule Take 1 capsule (100 mg total) by mouth 2 (two) times daily as needed for cough. 30 capsule 3  . Cholecalciferol (VITAMIN D) 2000 units tablet Take 2,000 Units by mouth daily.    .Marland Kitchengabapentin (NEURONTIN) 100 MG capsule Take one tablet nightly for 4 days then twice daily for 4 days then three times daily (Patient taking differently: Take 100 mg by mouth 3 (three) times daily. ) 90 capsule 3  . hydrOXYzine (ATARAX/VISTARIL)  25 MG tablet Take 0.5 tablets (12.5 mg total) by mouth 2 (two) times daily as needed for anxiety.    .Marland Kitchenipratropium-albuterol (DUONEB) 0.5-2.5 (3) MG/3ML SOLN Inhale 3 mLs into the lungs every 6 (six) hours as needed. (Patient taking differently: Inhale 3 mLs into the lungs 3 (three) times daily. ) 360 mL 3  . LINZESS 290 MCG CAPS capsule Take 1 capsule (290 mcg total) by mouth daily. 90 capsule 1  . Multiple Vitamins-Minerals (MULTIVITAMIN ADULT) TABS Take 1 tablet by mouth daily.    . Na Sulfate-K Sulfate-Mg Sulf (SUPREP BOWEL PREP KIT) 17.5-3.13-1.6 GM/177ML SOLN Take 1 kit by mouth as directed. 324 mL 0  . OXYGEN Inhale 3 L/min into the lungs continuous.    . sertraline (ZOLOFT) 100 MG tablet Take 1.5 tablets (150 mg total) by mouth daily. 135 tablet 1  . tiotropium (SPIRIVA) 18 MCG inhalation capsule Place 1 capsule (18 mcg total) into inhaler and inhale daily. 90 capsule 4  . traZODone (DESYREL) 50 MG tablet Take 0.5 tablets (25 mg total) by mouth at bedtime as needed for sleep.    . varenicline (CHANTIX CONTINUING MONTH PAK) 1 MG tablet Take 1 tablet (1 mg total) by mouth 2 (two) times daily. 60 tablet 5  . varenicline (CHANTIX PAK) 0.5 MG X 11 & 1 MG X 42 tablet Take one 0.5 mg tablet by mouth once daily for 3 days, then increase to one 0.5 mg tablet twice daily for 4 days, then increase to one 1 mg  tablet twice daily. 53 tablet 0  . VENTOLIN HFA 108 (90 Base) MCG/ACT inhaler Inhale 2 puffs into the lungs every 6 (six) hours as needed for shortness of breath. 6 Inhaler 4  . vitamin B-12 (CYANOCOBALAMIN) 1000 MCG tablet Take 1,000 mcg by mouth daily.    . vitamin E 400 UNIT capsule Take 400 Units by mouth daily.     . naproxen (NAPROSYN) 375 MG tablet Take 1 tablet twice daily with meals for 5 days then twice daily as needed (Patient taking differently: Take 375 mg by mouth 2 (two) times daily as needed (for pain.). ) 40 tablet 0  . SYMBICORT 160-4.5 MCG/ACT inhaler Inhale 2 puffs into the lungs  2 (two) times daily. 3 Inhaler 4   No facility-administered medications prior to visit.      Per HPI unless specifically indicated in ROS section below Review of Systems     Objective:    BP 120/82 (BP Location: Left Arm, Patient Position: Sitting, Cuff Size: Normal)   Pulse (!) 103   Temp 98 F (36.7 C) (Oral)   Wt 120 lb (54.4 kg)   SpO2 95% Comment: 3 L, pulsating  BMI 20.92 kg/m   Wt Readings from Last 3 Encounters:  08/18/17 120 lb (54.4 kg)  08/04/17 125 lb (56.7 kg)  07/07/17 125 lb (56.7 kg)    Physical Exam  Constitutional: She appears well-developed and well-nourished. No distress.  HENT:  Head: Normocephalic and atraumatic.  Right Ear: Hearing and external ear normal.  Left Ear: Hearing and external ear normal.  Nose: Mucosal edema (nasal mucosal erythema) present. No rhinorrhea. Right sinus exhibits maxillary sinus tenderness. Right sinus exhibits no frontal sinus tenderness. Left sinus exhibits maxillary sinus tenderness. Left sinus exhibits no frontal sinus tenderness.  Mouth/Throat: Uvula is midline, oropharynx is clear and moist and mucous membranes are normal. No oropharyngeal exudate, posterior oropharyngeal edema, posterior oropharyngeal erythema or tonsillar abscesses.  Eyes: Conjunctivae and EOM are normal. Pupils are equal, round, and reactive to light. No scleral icterus.  Neck: Normal range of motion. Neck supple.  Cardiovascular: Normal rate, regular rhythm, normal heart sounds and intact distal pulses.  No murmur heard. Pulmonary/Chest: Effort normal. No respiratory distress. She has decreased breath sounds (coarse thorughout). She has no wheezes. She has rhonchi. She has no rales.  Lymphadenopathy:    She has no cervical adenopathy.  Skin: Skin is warm and dry. No rash noted.  Nursing note and vitals reviewed.     Assessment & Plan:   Problem List Items Addressed This Visit    Acute sinusitis    Ongoing over 3-4 wks. Possible bacterial  infection. rec trial flonase scheduled x1-2 wks. WASP for augmentin course provided today with indications on when to fill. Pt agrees with plan.       Relevant Medications   fluticasone (FLONASE) 50 MCG/ACT nasal spray   amoxicillin-clavulanate (AUGMENTIN) 875-125 MG tablet   Anxiety    Doing better on lower hydroxyzine       Moderate recurrent major depression ([place]) - Primary    Significant improvement with med titration as per last visit and starting counseling in [employer]. Continue current regimen.  PHQ9 =15 -->  4 GAD7 = 14 --> 2      Tobacco abuse    Continue to encourage cessation. Contemplative. Planning on restarting chantix in the near future.           Meds ordered this encounter  Medications  . naproxen (NAPROSYN) 375  MG tablet    Sig: Take 1 tablet (375 mg total) by mouth 2 (two) times daily as needed for moderate pain.    Dispense:  40 tablet    Refill:  0  . SYMBICORT 160-4.5 MCG/ACT inhaler    Sig: Inhale 2 puffs into the lungs 2 (two) times daily.    Dispense:  3 Inhaler    Refill:  4  . fluticasone (FLONASE) 50 MCG/ACT nasal spray    Sig: Place 2 sprays into both nostrils daily.    Dispense:  16 g    Refill:  3  . amoxicillin-clavulanate (AUGMENTIN) 875-125 MG tablet    Sig: Take 1 tablet by mouth 2 (two) times daily for 10 days.    Dispense:  20 tablet    Refill:  0   No orders of the defined types were placed in this encounter.   Follow up plan: Return in about 6 months (around 02/18/2018) for annual exam, prior fasting for blood work.  Ria Bush, MD

## 2017-08-18 NOTE — Assessment & Plan Note (Signed)
Ongoing over 3-4 wks. Possible bacterial infection. rec trial flonase scheduled x1-2 wks. WASP for augmentin course provided today with indications on when to fill. Pt agrees with plan.

## 2017-08-18 NOTE — Assessment & Plan Note (Signed)
Continue to encourage cessation. Contemplative. Planning on restarting chantix in the near future.

## 2017-08-18 NOTE — Assessment & Plan Note (Signed)
Significant improvement with med titration as per last visit and starting counseling in [employer]. Continue current regimen.  PHQ9 =15 -->  4 GAD7 = 14 --> 2

## 2017-08-19 DIAGNOSIS — J449 Chronic obstructive pulmonary disease, unspecified: Secondary | ICD-10-CM | POA: Diagnosis not present

## 2017-08-21 ENCOUNTER — Ambulatory Visit: Payer: Medicare HMO | Admitting: Psychology

## 2017-09-01 ENCOUNTER — Ambulatory Visit: Payer: Self-pay | Admitting: Psychology

## 2017-09-15 ENCOUNTER — Other Ambulatory Visit: Payer: Self-pay

## 2017-09-17 ENCOUNTER — Other Ambulatory Visit: Payer: Self-pay

## 2017-09-19 DIAGNOSIS — J449 Chronic obstructive pulmonary disease, unspecified: Secondary | ICD-10-CM | POA: Diagnosis not present

## 2017-09-24 ENCOUNTER — Other Ambulatory Visit: Payer: Self-pay

## 2017-09-29 ENCOUNTER — Ambulatory Visit: Payer: Self-pay | Admitting: Family Medicine

## 2017-09-29 DIAGNOSIS — Z2089 Contact with and (suspected) exposure to other communicable diseases: Secondary | ICD-10-CM

## 2017-10-06 ENCOUNTER — Ambulatory Visit: Payer: Self-pay | Admitting: Family Medicine

## 2017-10-06 DIAGNOSIS — Z0289 Encounter for other administrative examinations: Secondary | ICD-10-CM

## 2017-10-08 ENCOUNTER — Other Ambulatory Visit: Payer: Self-pay

## 2017-10-10 ENCOUNTER — Other Ambulatory Visit: Payer: Self-pay | Admitting: *Deleted

## 2017-10-10 ENCOUNTER — Ambulatory Visit (INDEPENDENT_AMBULATORY_CARE_PROVIDER_SITE_OTHER): Payer: Medicare HMO | Admitting: Family Medicine

## 2017-10-10 ENCOUNTER — Encounter: Payer: Self-pay | Admitting: Family Medicine

## 2017-10-10 VITALS — BP 118/64 | HR 89 | Temp 98.3°F | Ht 63.5 in | Wt 119.0 lb

## 2017-10-10 DIAGNOSIS — R200 Anesthesia of skin: Secondary | ICD-10-CM | POA: Diagnosis not present

## 2017-10-10 DIAGNOSIS — R0981 Nasal congestion: Secondary | ICD-10-CM | POA: Diagnosis not present

## 2017-10-10 DIAGNOSIS — J441 Chronic obstructive pulmonary disease with (acute) exacerbation: Secondary | ICD-10-CM

## 2017-10-10 MED ORDER — HYDROXYZINE HCL 25 MG PO TABS: 12.5000 mg | ORAL_TABLET | Freq: Two times a day (BID) | ORAL | 1 refills | Status: DC | PRN

## 2017-10-10 MED ORDER — SERTRALINE HCL 100 MG PO TABS: 150.0000 mg | ORAL_TABLET | Freq: Every day | ORAL | 3 refills | Status: DC

## 2017-10-10 MED ORDER — IPRATROPIUM-ALBUTEROL 0.5-2.5 (3) MG/3ML IN SOLN: 3.0000 mL | Freq: Four times a day (QID) | RESPIRATORY_TRACT | 3 refills | Status: DC | PRN

## 2017-10-10 MED ORDER — PREDNISONE 20 MG PO TABS: ORAL_TABLET | ORAL | 0 refills | Status: DC

## 2017-10-10 MED ORDER — DOXYCYCLINE HYCLATE 100 MG PO TABS: 100.0000 mg | ORAL_TABLET | Freq: Two times a day (BID) | ORAL | 0 refills | Status: DC

## 2017-10-10 MED ORDER — SYMBICORT 160-4.5 MCG/ACT IN AERO: 2.0000 | INHALATION_SPRAY | Freq: Two times a day (BID) | RESPIRATORY_TRACT | 3 refills | Status: DC

## 2017-10-10 MED ORDER — TIOTROPIUM BROMIDE MONOHYDRATE 18 MCG IN CAPS: 18.0000 ug | ORAL_CAPSULE | Freq: Every day | RESPIRATORY_TRACT | 4 refills | Status: DC

## 2017-10-10 MED ORDER — TRAZODONE HCL 50 MG PO TABS: 25.0000 mg | ORAL_TABLET | Freq: Every evening | ORAL | 3 refills | Status: DC | PRN

## 2017-10-10 MED ORDER — SYMBICORT 160-4.5 MCG/ACT IN AERO: 2.0000 | INHALATION_SPRAY | Freq: Two times a day (BID) | RESPIRATORY_TRACT | 1 refills | Status: DC

## 2017-10-10 MED ORDER — LINZESS 290 MCG PO CAPS: 290.0000 ug | ORAL_CAPSULE | Freq: Every day | ORAL | 3 refills | Status: DC

## 2017-10-10 NOTE — Assessment & Plan Note (Signed)
Ongoing congestion - continue flonase. See above for further treatment.

## 2017-10-10 NOTE — Progress Notes (Signed)
BP 118/64 (BP Location: Right Arm, Patient Position: Sitting, Cuff Size: Normal)   Pulse 89   Temp 98.3 F (36.8 C) (Oral)   Ht 5' 3.5" (1.613 m)   Wt 119 lb (54 kg)   SpO2 95% Comment: 3 L, pulsating  BMI 20.75 kg/m    CC: worsening sinus symptoms, R arm numbness Subjective:    Patient ID: Christine Serrano, female    DOB: Mar 23, 1961, 57 y.o.   MRN: [id number]  HPI: Christine Serrano is a 57 y.o. female presenting on 10/10/2017 for Sinus Problem (States her sxs are worse. Now has greenish- brown nasal drainaige, sometimes bloody.) and Numbness (C/o numbness in right hand and weakness in right arm. Started about 2 wks ago. )   See prior note for details. Seen here 7 wks ago with 1 mo h/o sinus congestion treated with augmentin course. Progressively worsening. No improvement while on antibiotics. Flonase has helped. Ongoing sneezing, rhinorrhea and PNDrainage. Green mucous when coughing and blowing nose. Some bloody mucous as well. Coughing and wheezing. PNdrainage. She does use nasal saline. She does use chronic oxygen via nasal cannula. Consistent trouble with congestion for last several months. Increased dyspnea, cough, productive of increased sputum. Denies fevers/chills, ST, dyspnea. No significant h/o allergic rhinitis.   R arm/hand numbness/weakness noted over last 2 weeks "feels strange". Numbness started in pinky, progressed to 4th digit and palmar hand. Dropping plates. Had horseback riding neck injury 1984. No worsening neck pain. No shooting pain down arms. No prior neck surgery. Denies inciting trauma/injury.   Relevant past medical, surgical, family and social history reviewed and updated as indicated. Interim medical history since our last visit reviewed. Allergies and medications reviewed and updated. Outpatient Medications Prior to Visit  Medication Sig Dispense Refill  . aspirin EC 81 MG tablet Take 81 mg by mouth daily.    . benzonatate (TESSALON) 100 MG capsule Take 1  capsule (100 mg total) by mouth 2 (two) times daily as needed for cough. 30 capsule 3  . Cholecalciferol (VITAMIN D) 2000 units tablet Take 2,000 Units by mouth daily.    . fluticasone (FLONASE) 50 MCG/ACT nasal spray Place 2 sprays into both nostrils daily. 16 g 3  . gabapentin (NEURONTIN) 100 MG capsule Take one tablet nightly for 4 days then twice daily for 4 days then three times daily (Patient taking differently: Take 100 mg by mouth 3 (three) times daily. ) 90 capsule 3  . Multiple Vitamins-Minerals (MULTIVITAMIN ADULT) TABS Take 1 tablet by mouth daily.    . Na Sulfate-K Sulfate-Mg Sulf (SUPREP BOWEL PREP KIT) 17.5-3.13-1.6 GM/177ML SOLN Take 1 kit by mouth as directed. 324 mL 0  . naproxen (NAPROSYN) 375 MG tablet Take 1 tablet (375 mg total) by mouth 2 (two) times daily as needed for moderate pain. 40 tablet 0  . OXYGEN Inhale 3 L/min into the lungs continuous.    . varenicline (CHANTIX CONTINUING MONTH PAK) 1 MG tablet Take 1 tablet (1 mg total) by mouth 2 (two) times daily. 60 tablet 5  . varenicline (CHANTIX PAK) 0.5 MG X 11 & 1 MG X 42 tablet Take one 0.5 mg tablet by mouth once daily for 3 days, then increase to one 0.5 mg tablet twice daily for 4 days, then increase to one 1 mg tablet twice daily. 53 tablet 0  . VENTOLIN HFA 108 (90 Base) MCG/ACT inhaler Inhale 2 puffs into the lungs every 6 (six) hours as needed for shortness of breath. 6  Inhaler 4  . vitamin B-12 (CYANOCOBALAMIN) 1000 MCG tablet Take 1,000 mcg by mouth daily.    . vitamin E 400 UNIT capsule Take 400 Units by mouth daily.     . hydrOXYzine (ATARAX/VISTARIL) 25 MG tablet Take 0.5 tablets (12.5 mg total) by mouth 2 (two) times daily as needed for anxiety.    Marland Kitchen ipratropium-albuterol (DUONEB) 0.5-2.5 (3) MG/3ML SOLN Inhale 3 mLs into the lungs every 6 (six) hours as needed. (Patient taking differently: Inhale 3 mLs into the lungs 3 (three) times daily. ) 360 mL 3  . LINZESS 290 MCG CAPS capsule Take 1 capsule (290 mcg  total) by mouth daily. 90 capsule 1  . sertraline (ZOLOFT) 100 MG tablet Take 1.5 tablets (150 mg total) by mouth daily. 135 tablet 1  . SYMBICORT 160-4.5 MCG/ACT inhaler Inhale 2 puffs into the lungs 2 (two) times daily. 3 Inhaler 1  . tiotropium (SPIRIVA) 18 MCG inhalation capsule Place 1 capsule (18 mcg total) into inhaler and inhale daily. 90 capsule 4  . traZODone (DESYREL) 50 MG tablet Take 0.5 tablets (25 mg total) by mouth at bedtime as needed for sleep.     No facility-administered medications prior to visit.      Per HPI unless specifically indicated in ROS section below Review of Systems     Objective:    BP 118/64 (BP Location: Right Arm, Patient Position: Sitting, Cuff Size: Normal)   Pulse 89   Temp 98.3 F (36.8 C) (Oral)   Ht 5' 3.5" (1.613 m)   Wt 119 lb (54 kg)   SpO2 95% Comment: 3 L, pulsating  BMI 20.75 kg/m   Wt Readings from Last 3 Encounters:  10/10/17 119 lb (54 kg)  08/18/17 120 lb (54.4 kg)  08/04/17 125 lb (56.7 kg)    Physical Exam  Constitutional: She appears well-developed and well-nourished. No distress.  Wisner in place  HENT:  Head: Normocephalic and atraumatic.  Right Ear: Hearing, tympanic membrane, external ear and ear canal normal.  Left Ear: Hearing, tympanic membrane, external ear and ear canal normal.  Nose: Mucosal edema (nasal mucosal congestion with crusted mucous) present. No rhinorrhea. Right sinus exhibits maxillary sinus tenderness (mild). Right sinus exhibits no frontal sinus tenderness. Left sinus exhibits maxillary sinus tenderness (mild). Left sinus exhibits no frontal sinus tenderness.  Mouth/Throat: Uvula is midline, oropharynx is clear and moist and mucous membranes are normal. No oropharyngeal exudate, posterior oropharyngeal edema, posterior oropharyngeal erythema or tonsillar abscesses.  Eyes: Pupils are equal, round, and reactive to light. Conjunctivae and EOM are normal. No scleral icterus.  Neck: Normal range of motion.  Neck supple.  Cardiovascular: Normal rate, regular rhythm, normal heart sounds and intact distal pulses.  No murmur heard. Pulmonary/Chest: Effort normal. No respiratory distress. She has decreased breath sounds. She has wheezes (mild). She has no rhonchi. She has no rales.  Coarse breath sounds  Musculoskeletal: She exhibits no edema.  2+ DP bilaterally  Lymphadenopathy:    She has no cervical adenopathy.  Neurological: She is alert. A sensory deficit is present.  Diminished hand grip strength on right Diminished sensation to light touch along ulnar nerve distribution on right.  Neg tinel and phalen  Skin: Skin is warm and dry. No rash noted.  Nursing note and vitals reviewed.  Results for orders placed or performed in visit on 04/16/17  Cologuard  Result Value Ref Range   Cologuard Positive       Assessment & Plan:   Problem List  Items Addressed This Visit    COPD with acute exacerbation ([place]) - Primary    Persistent sinus congestion associated with symptoms of COPD exacerbation. Will Rx doxy/prednisone. Update with effect.       Relevant Medications   SYMBICORT 160-4.5 MCG/ACT inhaler   ipratropium-albuterol (DUONEB) 0.5-2.5 (3) MG/3ML SOLN   tiotropium (SPIRIVA) 18 MCG inhalation capsule   predniSONE (DELTASONE) 20 MG tablet   Nasal sinus congestion    Ongoing congestion - continue flonase. See above for further treatment.       Numbness of right hand    Describes neuropathy along ulnar nerve distribution, but no pain at cubital tunnel. Will see effect of prednisone course. If persistent, she will let me know to order NCS. Pt agrees with plan.           Meds ordered this encounter  Medications  . SYMBICORT 160-4.5 MCG/ACT inhaler    Sig: Inhale 2 puffs into the lungs 2 (two) times daily.    Dispense:  3 Inhaler    Refill:  3  . hydrOXYzine (ATARAX/VISTARIL) 25 MG tablet    Sig: Take 0.5 tablets (12.5 mg total) by mouth 2 (two) times daily as needed for anxiety.     Dispense:  90 tablet    Refill:  1  . ipratropium-albuterol (DUONEB) 0.5-2.5 (3) MG/3ML SOLN    Sig: Inhale 3 mLs into the lungs every 6 (six) hours as needed.    Dispense:  360 mL    Refill:  3  . LINZESS 290 MCG CAPS capsule    Sig: Take 1 capsule (290 mcg total) by mouth daily.    Dispense:  90 capsule    Refill:  3  . sertraline (ZOLOFT) 100 MG tablet    Sig: Take 1.5 tablets (150 mg total) by mouth daily.    Dispense:  135 tablet    Refill:  3  . tiotropium (SPIRIVA) 18 MCG inhalation capsule    Sig: Place 1 capsule (18 mcg total) into inhaler and inhale daily.    Dispense:  90 capsule    Refill:  4  . traZODone (DESYREL) 50 MG tablet    Sig: Take 0.5 tablets (25 mg total) by mouth at bedtime as needed for sleep.    Dispense:  45 tablet    Refill:  3  . DISCONTD: doxycycline (VIBRA-TABS) 100 MG tablet    Sig: Take 1 tablet (100 mg total) by mouth 2 (two) times daily.    Dispense:  20 tablet    Refill:  0  . DISCONTD: predniSONE (DELTASONE) 20 MG tablet    Sig: Take two tablets daily for 3 days followed by one tablet daily for 4 days    Dispense:  10 tablet    Refill:  0  . doxycycline (VIBRA-TABS) 100 MG tablet    Sig: Take 1 tablet (100 mg total) by mouth 2 (two) times daily.    Dispense:  20 tablet    Refill:  0  . predniSONE (DELTASONE) 20 MG tablet    Sig: Take two tablets daily for 3 days followed by one tablet daily for 4 days    Dispense:  10 tablet    Refill:  0   No orders of the defined types were placed in this encounter.   Follow up plan: No follow-ups on file.  Ria Bush, MD

## 2017-10-10 NOTE — Assessment & Plan Note (Signed)
Persistent sinus congestion associated with symptoms of COPD exacerbation. Will Rx doxy/prednisone. Update with effect.

## 2017-10-10 NOTE — Patient Instructions (Addendum)
Continue flonase.  Add on prednisone course, doxycycline course.  For hand numbness - this seems like an ulnar nerve neuropathy of unclear cause. - prednisone should help. If persistent after prednisone course, let me know and we will schedule nerve conduction studies for right hand/arm.

## 2017-10-10 NOTE — Assessment & Plan Note (Signed)
Describes neuropathy along ulnar nerve distribution, but no pain at cubital tunnel. Will see effect of prednisone course. If persistent, she will let me know to order NCS. Pt agrees with plan.

## 2017-10-19 DIAGNOSIS — J449 Chronic obstructive pulmonary disease, unspecified: Secondary | ICD-10-CM | POA: Diagnosis not present

## 2017-10-30 ENCOUNTER — Other Ambulatory Visit
Admission: RE | Admit: 2017-10-30 | Discharge: 2017-10-30 | Disposition: A | Discharge status: Home / Self Care | Payer: Medicare (Managed Care) | Source: Ambulatory Visit | Attending: Internal Medicine | Admitting: Internal Medicine

## 2017-10-30 DIAGNOSIS — Z0189 Encounter for other specified special examinations: Secondary | ICD-10-CM | POA: Insufficient documentation

## 2017-10-30 LAB — TIBC
Iron: 69 ug/dL (ref 34–165)
TIBC: 352 ug/dL (ref 250–450)
Transferrin Saturation: 20 % (ref 15–50)

## 2017-10-30 LAB — FERRITIN: Ferritin: 52 ng/mL (ref 10–120)

## 2017-10-30 LAB — MAGNESIUM: Magnesium: 2 mg/dL (ref 1.6–2.5)

## 2017-10-31 LAB — FOLATE: Folate: >14 ng/mL (ref >=4.6)

## 2017-10-31 LAB — VITAMIN B12: Vitamin B12: 925 pg/mL (ref 232–1245)

## 2017-11-19 DIAGNOSIS — J449 Chronic obstructive pulmonary disease, unspecified: Secondary | ICD-10-CM | POA: Diagnosis not present

## 2017-11-27 ENCOUNTER — Ambulatory Visit: Payer: Medicare HMO | Admitting: Family Medicine

## 2017-11-27 DIAGNOSIS — Z0289 Encounter for other administrative examinations: Secondary | ICD-10-CM

## 2017-12-19 DIAGNOSIS — J449 Chronic obstructive pulmonary disease, unspecified: Secondary | ICD-10-CM | POA: Diagnosis not present

## 2018-01-06 ENCOUNTER — Other Ambulatory Visit: Payer: Self-pay | Admitting: Pulmonary Disease

## 2018-01-07 ENCOUNTER — Ambulatory Visit: Payer: Self-pay

## 2018-01-07 NOTE — Telephone Encounter (Signed)
Pt. called to report increased shortness of breath with activity, over past week.  Is on 02 at 3L/ [place].  Also reported increase coughing; coughing up a green phlegm.  Denied fever/chills.  Denied chest pain.  02 Sat 88-92 %.  Reported she ran out of Ventolin Inhaler, about a week ago, and thinks this could be a contributing factor to her increased SOB.  Called her Pulmonologist, and he is out of the office this week.    Pt. stated she is not able to afford co-pay at [facility]  Requested appt. with PCP.  Advised that Dr. Danise Mina unavailable this week.  Appt. given for 9:30 AM, 7/25, with Dr. Glori Bickers.  Care advice given per protocol.  Verb. Understanding.          Reason for Disposition . [1] MODERATE longstanding difficulty breathing (e.g., speaks in phrases, SOB even at rest, pulse 100-120) AND [2] SAME as normal    Stated shortness of breath with activity has worsened over past week; coughing up green phlegm.  Able to speak in full sentences, but stated has been moderately to severely short of breath with activity.  Answer Assessment - Initial Assessment Questions 1. RESPIRATORY STATUS: "Describe your breathing?" (e.g., wheezing, shortness of breath, unable to speak, severe coughing)      Breathing is a little more labored with activity  2. ONSET: "When did this breathing problem begin?"      About a week ago  3. PATTERN "Does the difficult breathing come and go, or has it been constant since it started?"      Intermittent; sometimes it is better than others if it is cool  4. SEVERITY: "How bad is your breathing?" (e.g., mild, moderate, severe)    - MILD: No SOB at rest, mild SOB with walking, speaks normally in sentences, can lay down, no retractions, pulse < 100.    - MODERATE: SOB at rest, SOB with minimal exertion and prefers to sit, cannot lie down flat, speaks in phrases, mild retractions, audible wheezing, pulse 100-120.    - SEVERE: Very SOB at rest, speaks in single words, struggling to breathe,  sitting hunched forward, retractions, pulse > 120      Moderate-severe with activity 5. RECURRENT SYMPTOM: "Have you had difficulty breathing before?" If so, ask: "When was the last time?" and "What happened that time?"      Not usually a problem 6. CARDIAC HISTORY: "Do you have any history of heart disease?" (e.g., heart attack, angina, bypass surgery, angioplasty)      Has not had any problems  7. LUNG HISTORY: "Do you have any history of lung disease?"  (e.g., pulmonary embolus, asthma, emphysema)     COPD; on 02 since 2017 8. CAUSE: "What do you think is causing the breathing problem?"      Out of Ventolin Inhaler 9. OTHER SYMPTOMS: "Do you have any other symptoms? (e.g., dizziness, runny nose, cough, chest pain, fever)     Denies chest pain; coughing up green phlegm; denied fever/ chills  10. PREGNANCY: "Is there any chance you are pregnant?" "When was your last menstrual period?"       N/a  11. TRAVEL: "Have you traveled out of the country in the last month?" (e.g., travel history, exposures)       No  Protocols used: BREATHING DIFFICULTY-A-AH

## 2018-01-07 NOTE — Telephone Encounter (Signed)
Copied from [place] [id number]. Topic: General - Other >> Jan 07, 2018  4:48 PM Valla Leaver wrote: Reason for CRM: Patient wants to know if Dr. Darnell Level has samples of Ventolin because she won't get it through her mail order pharmacy for another week?

## 2018-01-07 NOTE — Telephone Encounter (Signed)
I spoke with pt and pt has appt with Dr Glori Bickers on 01/08/18 at 9:30. Pt is aware of has problems prior to appt she will go to ED. I also advised we do not have samples in our office; pt will ck with the pulmonologist to see if they still have samples. FYI to Dr Glori Bickers.

## 2018-01-07 NOTE — Telephone Encounter (Signed)
additional note from [place]:  Christine Serrano; added on to Dr. Marliss Coots schedule at 9:30 AM, 7/25; increased SOB with activity and coughing up green phlegm over past week.    Routing comment

## 2018-01-08 ENCOUNTER — Ambulatory Visit: Payer: Self-pay | Admitting: Family Medicine

## 2018-01-08 NOTE — Telephone Encounter (Signed)
PLEASE NOTE: All timestamps contained within this report are represented as [place] Standard Time. CONFIDENTIALTY NOTICE: This fax transmission is intended only for the addressee. It contains information that is legally privileged, confidential or otherwise protected from use or disclosure. If you are not the intended recipient, you are strictly prohibited from reviewing, disclosing, copying using or disseminating any of this information or taking any action in reliance on or regarding this information. If you have received this fax in error, please notify us immediately by telephone so that we can arrange for its return to [place]. Phone: [phone], Toll-Free: [phone], Fax: [phone] Page: 1 of 1 Call Id: [id number] [place] Night - Client Nonclinical Telephone Record [place] Night - Client Client Site [place] Physician Loura Pardon - MD Contact Type Call Who Is Calling Patient / Member / Family / Caregiver Caller Name Alencia Gordon Caller Phone Number [phone] Patient Name Christine Serrano Patient DOB 11-03-1960 Call Type Message Only Information Provided Reason for Call Request to [facility] Appointment Initial Comment Caller stated she needs to cancel her appointment at 9:30 on 7/25. Additional Comment Call Closed By: Netta Corrigan Transaction Date/Time: 01/08/2018 5:00:19 AM (ET)

## 2018-01-08 NOTE — Telephone Encounter (Signed)
Cancelled appt as requested and was unable to reach pt by phone.

## 2018-01-09 ENCOUNTER — Telehealth: Payer: Self-pay | Admitting: Pulmonary Disease

## 2018-01-09 NOTE — Telephone Encounter (Signed)
Patient calling stating we sent in her refill for Ventolin to her mail order but she's been out for about 3 days now and was calling to see if we had any samples for her to use until it came in the mail  Please advise

## 2018-01-09 NOTE — Telephone Encounter (Signed)
Informed pt that we don't get samples of ventolin. Pt verbalized understanding. Nothing further needed.

## 2018-01-12 ENCOUNTER — Other Ambulatory Visit: Payer: Self-pay | Admitting: *Deleted

## 2018-01-12 ENCOUNTER — Telehealth: Payer: Self-pay | Admitting: Pulmonary Disease

## 2018-01-12 DIAGNOSIS — J449 Chronic obstructive pulmonary disease, unspecified: Secondary | ICD-10-CM

## 2018-01-12 NOTE — Telephone Encounter (Signed)
Appointment for PFT/CXR scheduled for 01/15/18 at 10:30 at [facility]. F/U appointment scheduled for 01/23/18 at 11:30 with Dr. Alva Garnet. Pt is aware of appointments and appointment information mailed to patient along with instructions for PFT. Pt is aware that she will get the PFT and the CXR on the same day. Nothing else is needed at this time. Rhonda J Cobb

## 2018-01-12 NOTE — Telephone Encounter (Signed)
Please call pt to schedule PFT and CXR

## 2018-01-15 ENCOUNTER — Ambulatory Visit: Payer: Medicare HMO

## 2018-01-15 ENCOUNTER — Ambulatory Visit (HOSPITAL_COMMUNITY): Payer: Medicare HMO

## 2018-01-15 ENCOUNTER — Ambulatory Visit
Admission: RE | Admit: 2018-01-15 | Discharge: 2018-01-15 | Disposition: A | Discharge status: Home / Self Care | Payer: Medicare HMO | Source: Ambulatory Visit | Attending: Pulmonary Disease | Admitting: Pulmonary Disease

## 2018-01-15 DIAGNOSIS — R05 Cough: Secondary | ICD-10-CM | POA: Diagnosis not present

## 2018-01-15 DIAGNOSIS — J449 Chronic obstructive pulmonary disease, unspecified: Secondary | ICD-10-CM | POA: Insufficient documentation

## 2018-01-15 MED ORDER — ALBUTEROL SULFATE (2.5 MG/3ML) 0.083% IN NEBU
2.5000 mg | INHALATION_SOLUTION | Freq: Once | RESPIRATORY_TRACT | Status: AC
  Administered 2018-01-15: 2.5 mg via RESPIRATORY_TRACT
  Filled 2018-01-15: qty 3

## 2018-01-19 DIAGNOSIS — J449 Chronic obstructive pulmonary disease, unspecified: Secondary | ICD-10-CM | POA: Diagnosis not present

## 2018-01-23 ENCOUNTER — Encounter: Payer: Self-pay | Admitting: Pulmonary Disease

## 2018-01-23 ENCOUNTER — Ambulatory Visit (INDEPENDENT_AMBULATORY_CARE_PROVIDER_SITE_OTHER): Payer: Medicare HMO | Admitting: Pulmonary Disease

## 2018-01-23 VITALS — BP 110/68 | HR 109 | Ht 63.5 in

## 2018-01-23 DIAGNOSIS — J432 Centrilobular emphysema: Secondary | ICD-10-CM

## 2018-01-23 DIAGNOSIS — J449 Chronic obstructive pulmonary disease, unspecified: Secondary | ICD-10-CM

## 2018-01-23 DIAGNOSIS — F17200 Nicotine dependence, unspecified, uncomplicated: Secondary | ICD-10-CM

## 2018-01-23 NOTE — Patient Instructions (Signed)
We reviewed lung function tests (PFTs) which demonstrate very severe COPD  We again discussed the importance of smoking cessation  Continue Symbicort, Spiriva, DuoNeb, albuterol as previously prescribed  Continue oxygen therapy is close to 24 hours/day as possible  If you are able to quit smoking for 1 month, contact our office and I will make referral for consideration of lung transplantation  Follow-up in 3 months or sooner as needed

## 2018-01-25 NOTE — Progress Notes (Signed)
PULMONARY OFFICE FOLLOW UP  PT PROFILE: 69 F recalcitrant smoker with severe, oxygen-dependent COPD hospitalized 10/24-10/26/17 for COPD exacerbation initially requiring NIPPV  PROBLEMS: Recalcitrant smoker Chronic hypoxic respiratory failure Severe emphysema Chronic bronchitis  DATA: CT chest 03/13/16: Severe emphysema with extensive disease in the upper lobes and superior segment of the right lower lobe. This could represent acute and/or chronic changes. In addition, there is an irregular spiculated density along the right minor fissure which is indeterminate. PFTs 01/15/18: FVC: 1.42 L (48 %pred), FEV1: 0.76 L (31 %pred), FEV1/FVC: 53% , TLC: 4.49 L (94 %pred), DLCO 30 %pred, DDLCO/VA 51%. TLC likely underestimated by gas dilution technique. Severe Gas trapping    INTERVAL:  Last see 06/23/17. No major pulmonary events   SUBJ: Last visit varenicline was prescribed but she could not tolerate it due to nightmares. She continues to smoke 1/2 PPD. She remains mod-severely limited by DOE with little DTDV. Remains compliant with Symbicort, Spiriva. Using Duoneb TID. Using alb MDI 3-5 times per day. Compliant with O2  OBJ: Vitals:   01/23/18 1127 01/23/18 1137  BP:  110/68  Pulse:  (!) 109  SpO2:  91%  Height: 5' 3.5" (1.613 m)   3 LPM [place] pulse   Gen: NAD. pleasant HEENT: WNL Neck: No JVD Lungs: hyperresonant, diminished BS, few [place] wheezes Cardiovascular: RRR, no M Abdomen: Soft, NT +BS Ext: no C/C/E Neuro: grossly intact Skin: No lesions noted  CXR 01/15/18: Severe hyperinflation without acute findings  IMPRESSION:   ICD-10-CM   1. Smoker F17.200   2. COPD, very severe ([place]) J44.9   3. Centrilobular emphysema ([facility]) J43.2     PLAN: We reviewed lung function tests (PFTs) which demonstrate very severe COPD  I emphasized that there are no other medical therapies available to be added  We discussed the option of lung transplantation if she is able to quit smoking  We  discussed in detail the imperative of smoking cessation  Continue Symbicort, Spiriva, DuoNeb, albuterol MDI as previously prescribed  Continue oxygen therapy is close to 24 hours/day as possible  If she is able to quit smoking for 1 month, contact our office and I will make referral for consideration of lung transplantation. She will need referral to pulm rehab if so  Follow-up in 3 months or sooner as needed  Merton Border, MD PCCM service Mobile [phone] Pager [phone] 01/25/2018 5:54 PM

## 2018-02-02 ENCOUNTER — Telehealth: Payer: Self-pay | Admitting: Pulmonary Disease

## 2018-02-02 DIAGNOSIS — J449 Chronic obstructive pulmonary disease, unspecified: Secondary | ICD-10-CM

## 2018-02-02 NOTE — Telephone Encounter (Signed)
Pt wanted to let Dr. Alva Garnet know she has quit smoking. States is unable to get around with her portable oxygen. She is asking if she can get an order for a portable scooter, due to she cannot get around with being winded.  Please call to discuss.

## 2018-02-02 NOTE — Telephone Encounter (Signed)
Pt wants to make Dr. Alva Garnet aware she has quit smoking. Her last cigarette was on last Friday. She wants to know if Pulmonary Rehab is being ordered?  Pt advised to contact pcp for rx for electric scooter.

## 2018-02-02 NOTE — Telephone Encounter (Signed)
Referral placed for pulm rehab

## 2018-02-02 NOTE — Telephone Encounter (Signed)
Please place order for pulmonary rehab. Thanks  Waunita Schooner

## 2018-02-03 ENCOUNTER — Other Ambulatory Visit: Payer: Self-pay | Admitting: Family Medicine

## 2018-02-03 DIAGNOSIS — D751 Secondary polycythemia: Secondary | ICD-10-CM

## 2018-02-03 DIAGNOSIS — E785 Hyperlipidemia, unspecified: Secondary | ICD-10-CM

## 2018-02-03 DIAGNOSIS — E041 Nontoxic single thyroid nodule: Secondary | ICD-10-CM

## 2018-02-04 ENCOUNTER — Ambulatory Visit (INDEPENDENT_AMBULATORY_CARE_PROVIDER_SITE_OTHER): Payer: Medicare HMO | Admitting: Family Medicine

## 2018-02-04 ENCOUNTER — Encounter: Payer: Self-pay | Admitting: Family Medicine

## 2018-02-04 VITALS — BP 120/62 | HR 108 | Temp 98.2°F | Ht 63.5 in | Wt 112.2 lb

## 2018-02-04 DIAGNOSIS — B354 Tinea corporis: Secondary | ICD-10-CM | POA: Diagnosis not present

## 2018-02-04 DIAGNOSIS — Z720 Tobacco use: Secondary | ICD-10-CM | POA: Diagnosis not present

## 2018-02-04 DIAGNOSIS — J439 Emphysema, unspecified: Secondary | ICD-10-CM

## 2018-02-04 LAB — POCT SKIN KOH: Skin KOH, POC: POSITIVE — AB

## 2018-02-04 MED ORDER — CLOTRIMAZOLE 1 % EX CREA: 1.0000 "application " | TOPICAL_CREAM | Freq: Two times a day (BID) | CUTANEOUS | 0 refills | Status: DC

## 2018-02-04 MED ORDER — NAPROXEN 375 MG PO TABS: 375.0000 mg | ORAL_TABLET | Freq: Two times a day (BID) | ORAL | 1 refills | Status: DC | PRN

## 2018-02-04 NOTE — Patient Instructions (Addendum)
I think you have ringworm. Treat with lotrimin cream twice daily for 2 weeks and let us know if not improved with this.   Body Ringworm Body ringworm is an infection of the skin that often causes a ring-shaped rash. Body ringworm can affect any part of your skin. It can spread easily to others. Body ringworm is also called tinea corporis. What are the causes? This condition is caused by funguses called dermatophytes. The condition develops when these funguses grow out of control on the skin. You can get this condition if you touch a person or animal that has it. You can also get it if you share clothing, bedding, towels, or any other object with an infected person or pet. What increases the risk? This condition is more likely to develop in:  Athletes who often make skin-to-skin contact with other athletes, such as wrestlers.  People who share equipment and mats.  People with a weakened immune system.  What are the signs or symptoms? Symptoms of this condition include:  Itchy, raised red spots and bumps.  Red scaly patches.  A ring-shaped rash. The rash may have: ? A clear center. ? Scales or red bumps at its center. ? Redness near its borders. ? Dry and scaly skin on or around it.  How is this diagnosed? This condition can usually be diagnosed with a skin exam. A skin scraping may be taken from the affected area and examined under a microscope to see if the fungus is present. How is this treated? This condition may be treated with:  An antifungal cream or ointment.  An antifungal shampoo.  Antifungal medicines. These may be prescribed if your ringworm is severe, keeps coming back, or lasts a long time.  Follow these instructions at home:  Take over-the-counter and prescription medicines only as told by your health care provider.  If you were given an antifungal cream or ointment: ? Use it as told by your health care provider. ? Wash the infected area and dry it  completely before applying the cream or ointment.  If you were given an antifungal shampoo: ? Use it as told by your health care provider. ? Leave the shampoo on your body for 3-5 minutes before rinsing.  While you have a rash: ? Wear loose clothing to stop clothes from rubbing and irritating it. ? Wash or change your bed sheets every night.  If your pet has the same infection, take your pet to see a Animal nutritionist. How is this prevented?  Practice good hygiene.  Wear sandals or shoes in public places and showers.  Do not share personal items with others.  Avoid touching red patches of skin on other people.  Avoid touching pets that have bald spots.  If you touch an animal that has a bald spot, wash your hands. Contact a health care provider if:  Your rash continues to spread after 7 days of treatment.  Your rash is not gone in 4 weeks.  The area around your rash gets red, warm, tender, and swollen. This information is not intended to replace advice given to you by your health care provider. Make sure you discuss any questions you have with your health care provider. Document Released: 05/31/2000 Document Revised: 11/09/2015 Document Reviewed: 03/30/2015 Elsevier Interactive Patient Education  [employer].

## 2018-02-04 NOTE — Progress Notes (Signed)
BP 120/62 (BP Location: Left Arm, Patient Position: Sitting, Cuff Size: Normal)   Pulse (!) 108   Temp 98.2 F (36.8 C) (Oral)   Ht 5' 3.5" (1.613 m)   Wt 112 lb 4 oz (50.9 kg)   SpO2 95% Comment: 2 L, pulsating  BMI 19.57 kg/m    CC: rash Subjective:    Patient ID: Christine Serrano, female    DOB: Aug 07, 1960, 57 y.o.   MRN: [id number]  HPI: Christine Serrano is a 57 y.o. female presenting on 02/04/2018 for Rash (C/o circular, itchy rash on right side of neck. Noticed about 2 wks ago.) and Other (Pt wants to discuss getting electric scooter.)   No showed several appts in last several months.   2 wk h/o circular rash on right upper chest medial clavicle. Mildly itchy. Treating with OTC cortisone-10. No new lotions, detergents, soaps or shampoos.  No new medicines. No new foods, supplements.  No recet abx or steroids.   Requests naprosyn refill - takes PRN back pain.   Severe oxygen dependent COPD followed by Dr Alva Garnet. Recently referred to pulm rehab. States she stopped smoking last week but has had a cigarette here and there <1/day since then.  Asks about electric scooter - advised to get pulm recommendations and if appropriate, she can return for face to face mobility evaluation.   Relevant past medical, surgical, family and social history reviewed and updated as indicated. Interim medical history since our last visit reviewed. Allergies and medications reviewed and updated. Outpatient Medications Prior to Visit  Medication Sig Dispense Refill  . aspirin EC 81 MG tablet Take 81 mg by mouth daily.    . benzonatate (TESSALON) 100 MG capsule Take 1 capsule (100 mg total) by mouth 2 (two) times daily as needed for cough. 30 capsule 3  . Cholecalciferol (VITAMIN D) 2000 units tablet Take 2,000 Units by mouth daily.    . fluticasone (FLONASE) 50 MCG/ACT nasal spray Place 2 sprays into both nostrils daily. 16 g 3  . gabapentin (NEURONTIN) 100 MG capsule Take one tablet nightly for  4 days then twice daily for 4 days then three times daily (Patient taking differently: Take 100 mg by mouth 3 (three) times daily. ) 90 capsule 3  . hydrOXYzine (ATARAX/VISTARIL) 25 MG tablet Take 0.5 tablets (12.5 mg total) by mouth 2 (two) times daily as needed for anxiety. 90 tablet 1  . ipratropium-albuterol (DUONEB) 0.5-2.5 (3) MG/3ML SOLN Inhale 3 mLs into the lungs every 6 (six) hours as needed. 360 mL 3  . LINZESS 290 MCG CAPS capsule Take 1 capsule (290 mcg total) by mouth daily. 90 capsule 3  . Multiple Vitamins-Minerals (MULTIVITAMIN ADULT) TABS Take 1 tablet by mouth daily.    . Na Sulfate-K Sulfate-Mg Sulf (SUPREP BOWEL PREP KIT) 17.5-3.13-1.6 GM/177ML SOLN Take 1 kit by mouth as directed. 324 mL 0  . OXYGEN Inhale 3 L/min into the lungs continuous.    . sertraline (ZOLOFT) 100 MG tablet Take 1.5 tablets (150 mg total) by mouth daily. 135 tablet 3  . SYMBICORT 160-4.5 MCG/ACT inhaler Inhale 2 puffs into the lungs 2 (two) times daily. 3 Inhaler 3  . tiotropium (SPIRIVA) 18 MCG inhalation capsule Place 1 capsule (18 mcg total) into inhaler and inhale daily. 90 capsule 4  . traZODone (DESYREL) 50 MG tablet Take 0.5 tablets (25 mg total) by mouth at bedtime as needed for sleep. 45 tablet 3  . VENTOLIN HFA 108 (90 Base) MCG/ACT inhaler INHALE  2 PUFFS INTO THE LUNGS EVERY 6 (SIX) HOURS AS NEEDED FOR SHORTNESS OF BREATH. 72 g 0  . vitamin B-12 (CYANOCOBALAMIN) 1000 MCG tablet Take 1,000 mcg by mouth daily.    . vitamin E 400 UNIT capsule Take 400 Units by mouth daily.     . naproxen (NAPROSYN) 375 MG tablet Take 1 tablet (375 mg total) by mouth 2 (two) times daily as needed for moderate pain. 40 tablet 0   No facility-administered medications prior to visit.      Per HPI unless specifically indicated in ROS section below Review of Systems     Objective:    BP 120/62 (BP Location: Left Arm, Patient Position: Sitting, Cuff Size: Normal)   Pulse (!) 108   Temp 98.2 F (36.8 C) (Oral)    Ht 5' 3.5" (1.613 m)   Wt 112 lb 4 oz (50.9 kg)   SpO2 95% Comment: 2 L, pulsating  BMI 19.57 kg/m   Wt Readings from Last 3 Encounters:  02/04/18 112 lb 4 oz (50.9 kg)  10/10/17 119 lb (54 kg)  08/18/17 120 lb (54.4 kg)    Physical Exam  Constitutional: She appears well-developed and well-nourished. No distress.  Wearing supplemental oxygen by [place]  Skin: Rash noted.  Scaly annular rash R upper chest medial clavicle, slightly pruritic. Skin scraping collected for [place]  Nursing note and vitals reviewed.    Results for orders placed or performed in visit on 02/04/18  POCT Skin KOH  Result Value Ref Range   Skin KOH, POC Positive (A) Negative      Assessment & Plan:   Problem List Items Addressed This Visit    Tobacco abuse    Continue to encourage cessation.       Tinea corporis - Primary    Anticipate tinea corporis. Rx clotrimazole BID x 2 wks, update with effect. Has f/u appt by then.  Check KOH today = positive.       Relevant Medications   clotrimazole (LOTRIMIN) 1 % cream   Other Relevant Orders   POCT Skin KOH (Completed)   COPD (chronic obstructive pulmonary disease) ([place])    Advised touch base with pulm re: eligibility for POV. She may return here for face to face mobility evaluation if desired.           Meds ordered this encounter  Medications  . naproxen (NAPROSYN) 375 MG tablet    Sig: Take 1 tablet (375 mg total) by mouth 2 (two) times daily as needed for moderate pain.    Dispense:  40 tablet    Refill:  1  . clotrimazole (LOTRIMIN) 1 % cream    Sig: Apply 1 application topically 2 (two) times daily. For 2 weeks to rash    Dispense:  60 g    Refill:  0   Orders Placed This Encounter  Procedures  . POCT Skin KOH    Follow up plan: No follow-ups on file.  Ria Bush, MD

## 2018-02-04 NOTE — Assessment & Plan Note (Addendum)
Anticipate tinea corporis. Rx clotrimazole BID x 2 wks, update with effect. Has f/u appt by then.  Check KOH today = positive.

## 2018-02-04 NOTE — Assessment & Plan Note (Signed)
Continue to encourage cessation. 

## 2018-02-04 NOTE — Assessment & Plan Note (Signed)
Advised touch base with pulm re: eligibility for POV. She may return here for face to face mobility evaluation if desired.

## 2018-02-06 ENCOUNTER — Ambulatory Visit (INDEPENDENT_AMBULATORY_CARE_PROVIDER_SITE_OTHER): Payer: Medicare HMO

## 2018-02-06 ENCOUNTER — Ambulatory Visit: Payer: Self-pay

## 2018-02-06 ENCOUNTER — Telehealth: Payer: Self-pay | Admitting: Family Medicine

## 2018-02-06 ENCOUNTER — Telehealth: Payer: Self-pay | Admitting: Pulmonary Disease

## 2018-02-06 VITALS — BP 108/62 | HR 79 | Temp 98.5°F | Ht 64.5 in | Wt 113.5 lb

## 2018-02-06 DIAGNOSIS — E041 Nontoxic single thyroid nodule: Secondary | ICD-10-CM | POA: Diagnosis not present

## 2018-02-06 DIAGNOSIS — D751 Secondary polycythemia: Secondary | ICD-10-CM

## 2018-02-06 DIAGNOSIS — Z0000 Encounter for general adult medical examination without abnormal findings: Secondary | ICD-10-CM

## 2018-02-06 DIAGNOSIS — E785 Hyperlipidemia, unspecified: Secondary | ICD-10-CM | POA: Diagnosis not present

## 2018-02-06 LAB — LIPID PANEL
CHOL/HDL RATIO: 4
Cholesterol: 181 mg/dL (ref 0–200)
HDL: 45.3 mg/dL (ref >39.00)
LDL Cholesterol: 105 mg/dL — ABNORMAL HIGH (ref 0–99)
NONHDL: 135.96
Triglycerides: 153 mg/dL — ABNORMAL HIGH (ref 0.0–149.0)
VLDL: 30.6 mg/dL (ref 0.0–40.0)

## 2018-02-06 LAB — CBC WITH DIFFERENTIAL/PLATELET
BASOS PCT: 0.4 % (ref 0.0–3.0)
Basophils Absolute: 0 10*3/uL (ref 0.0–0.1)
EOS PCT: 3.2 % (ref 0.0–5.0)
Eosinophils Absolute: 0.2 10*3/uL (ref 0.0–0.7)
HCT: 39.3 % (ref 36.0–46.0)
HEMOGLOBIN: 13.2 g/dL (ref 12.0–15.0)
LYMPHS PCT: 36.5 % (ref 12.0–46.0)
Lymphs Abs: 2.3 10*3/uL (ref 0.7–4.0)
MCHC: 33.5 g/dL (ref 30.0–36.0)
MCV: 85.5 fl (ref 78.0–100.0)
MONOS PCT: 6.8 % (ref 3.0–12.0)
Monocytes Absolute: 0.4 10*3/uL (ref 0.1–1.0)
NEUTROS ABS: 3.4 10*3/uL (ref 1.4–7.7)
NEUTROS PCT: 53.1 % (ref 43.0–77.0)
Platelets: 204 10*3/uL (ref 150.0–400.0)
RBC: 4.6 Mil/uL (ref 3.87–5.11)
RDW: 15 % (ref 11.5–15.5)
WBC: 6.3 10*3/uL (ref 4.0–10.5)

## 2018-02-06 LAB — COMPREHENSIVE METABOLIC PANEL
ALT: 7 U/L (ref 0–35)
AST: 15 U/L (ref 0–37)
Albumin: 4.1 g/dL (ref 3.5–5.2)
Alkaline Phosphatase: 72 U/L (ref 39–117)
BUN: 9 mg/dL (ref 6–23)
CO2: 35 meq/L — AB (ref 19–32)
CREATININE: 0.66 mg/dL (ref 0.40–1.20)
Calcium: 9.9 mg/dL (ref 8.4–10.5)
Chloride: 99 mEq/L (ref 96–112)
GFR: 97.4 mL/min (ref >60.00)
GLUCOSE: 89 mg/dL (ref 70–99)
Potassium: 4.2 mEq/L (ref 3.5–5.1)
Sodium: 139 mEq/L (ref 135–145)
Total Bilirubin: 0.4 mg/dL (ref 0.2–1.2)
Total Protein: 6.7 g/dL (ref 6.0–8.3)

## 2018-02-06 LAB — TSH: TSH: 1.2 u[IU]/mL (ref 0.35–4.50)

## 2018-02-06 NOTE — Patient Instructions (Signed)
Christine Serrano , Thank you for taking time to come for your Medicare Wellness Visit. I appreciate your ongoing commitment to your health goals. Please review the following plan we discussed and let me know if I can assist you in the future.   These are the goals we discussed: Goals    . Patient Stated     Starting 02/06/2018, I will continue to take medications as prescribed.        This is a list of the screening recommended for you and due dates:  Health Maintenance  Topic Date Due  . Flu Shot  09/16/2018*  . DTaP/Tdap/Td vaccine (1 - Tdap) 02/06/2027*  . Tetanus Vaccine  02/06/2027*  . Pap Smear  02/22/2019  . Mammogram  05/08/2019  . Colon Cancer Screening  08/04/2022  .  Hepatitis C: One time screening is recommended by Center for Disease Control  (CDC) for  adults born from [birth year] through 1967.   Completed  . HIV Screening  Completed  *Topic was postponed. The date shown is not the original due date.   Preventive Care for Adults  A healthy lifestyle and preventive care can promote health and wellness. Preventive health guidelines for adults include the following key practices.  . A routine yearly physical is a good way to check with your health care provider about your health and preventive screening. It is a chance to share any concerns and updates on your health and to receive a thorough exam.  . Visit your dentist for a routine exam and preventive care every 6 months. Brush your teeth twice a day and floss once a day. Good oral hygiene prevents tooth decay and gum disease.  . The frequency of eye exams is based on your age, health, family medical history, use  of contact lenses, and other factors. Follow your health care provider's recommendations for frequency of eye exams.  . Eat a healthy diet. Foods like vegetables, fruits, whole grains, low-fat dairy products, and lean protein foods contain the nutrients you need without too many calories. Decrease your intake of foods  high in solid fats, added sugars, and salt. Eat the right amount of calories for you. Get information about a proper diet from your health care provider, if necessary.  . Regular physical exercise is one of the most important things you can do for your health. Most adults should get at least 150 minutes of moderate-intensity exercise (any activity that increases your heart rate and causes you to sweat) each week. In addition, most adults need muscle-strengthening exercises on 2 or more days a week.  Silver Sneakers may be a benefit available to you. To determine eligibility, you may visit the website: www.silversneakers.com or contact program at [phone] Mon-Fri between 8AM-8PM.   . Maintain a healthy weight. The body mass index (BMI) is a screening tool to identify possible weight problems. It provides an estimate of body fat based on height and weight. Your health care provider can find your BMI and can help you achieve or maintain a healthy weight.   For adults 20 years and older: ? A BMI below 18.5 is considered underweight. ? A BMI of 18.5 to 24.9 is normal. ? A BMI of 25 to 29.9 is considered overweight. ? A BMI of 30 and above is considered obese.   . Maintain normal blood lipids and cholesterol levels by exercising and minimizing your intake of saturated fat. Eat a balanced diet with plenty of fruit and vegetables. Blood tests for lipids  and cholesterol should begin at age 68 and be repeated every 5 years. If your lipid or cholesterol levels are high, you are over 50, or you are at high risk for heart disease, you may need your cholesterol levels checked more frequently. Ongoing high lipid and cholesterol levels should be treated with medicines if diet and exercise are not working.  . If you smoke, find out from your health care provider how to quit. If you do not use tobacco, please do not start.  . If you choose to drink alcohol, please do not consume more than 2 drinks per day.  One drink is considered to be 12 ounces (355 mL) of beer, 5 ounces (148 mL) of wine, or 1.5 ounces (44 mL) of liquor.  . If you are 60-25 years old, ask your health care provider if you should take aspirin to prevent strokes.  . Use sunscreen. Apply sunscreen liberally and repeatedly throughout the day. You should seek shade when your shadow is shorter than you. Protect yourself by wearing long sleeves, pants, a wide-brimmed hat, and sunglasses year round, whenever you are outdoors.  . Once a month, do a whole body skin exam, using a mirror to look at the skin on your back. Tell your health care provider of new moles, moles that have irregular borders, moles that are larger than a pencil eraser, or moles that have changed in shape or color.

## 2018-02-06 NOTE — Telephone Encounter (Signed)
Patient calling in regards to rehab referral Has not heard anything and would like to get process started  Please advise

## 2018-02-06 NOTE — Progress Notes (Signed)
PCP notes:   Health maintenance:  Flu vaccine - addressed  Abnormal screenings:   Depression score: 10 Depression screen [facility] 2/9 02/06/2018 08/18/2017 07/07/2017 02/05/2017 10/25/2016  Decreased Interest 1 1 3 1 3   Down, Depressed, Hopeless 3 0 2 1 3   PHQ - 2 Score 4 1 5 2 6   Altered sleeping 1 1 3 1 2   Tired, decreased energy 3 1 3 2 3   Change in appetite 1 0 2 0 2  Feeling bad or failure about yourself  1 0 0 0 2  Trouble concentrating 0 1 1 0 1  Moving slowly or fidgety/restless 0 0 1 0 0  Suicidal thoughts 0 0 0 0 0  PHQ-9 Score 10 4 15 5 16   Difficult doing work/chores Very difficult - - Somewhat difficult -   Patient concerns:   None  Nurse concerns:  None  Next PCP appt:   02/23/18 @ 1030

## 2018-02-06 NOTE — Progress Notes (Signed)
Subjective:   Christine Serrano is a 57 y.o. female who presents for Medicare Annual (Subsequent) preventive examination.  Review of Systems:  N/A Cardiac Risk Factors include: smoking/ tobacco exposure     Objective:     Vitals: BP 108/62 (BP Location: Right Arm, Patient Position: Sitting, Cuff Size: Normal)   Pulse 79   Temp 98.5 F (36.9 C) (Oral)   Ht 5' 4.5" (1.638 m) Comment: shoes  Wt 113 lb 8 oz (51.5 kg)   SpO2 98%   BMI 19.18 kg/m   Body mass index is 19.18 kg/m.  Advanced Directives 02/06/2018 08/04/2017 07/18/2017 02/05/2017 04/10/2016 04/09/2016 09/28/2015  Does Patient Have a Medical Advance Directive? Yes Yes Yes Yes No Yes No  Type of Paramedic of [place];Living will Healthcare Power of [place] of [place];Living will -  Does patient want to make changes to medical advance directive? - - No - Patient declined - - No - Patient declined -  Copy of [place] in Chart? No - copy requested Yes No - copy requested No - copy requested - No - copy requested -  Would patient like information on creating a medical advance directive? - - - - No - patient declined information - -    Tobacco Social History   Tobacco Use  Smoking Status Current Some Day Smoker  . Packs/day: 0.50  . Years: 47.00  . Pack years: 23.50  . Types: Cigarettes  Smokeless Tobacco Never Used  Tobacco Comment   Trying to quit smoking     Ready to quit: Yes Counseling given: No Comment: Trying to quit smoking   Clinical Intake:  Pre-visit preparation completed: Yes  Pain : No/denies pain Pain Score: 0-No pain     Nutritional Status: BMI of 19-24  Normal Nutritional Risks: None Diabetes: No  How often do you need to have someone help you when you read instructions, pamphlets, or other written materials from your doctor or pharmacy?: 1 - Never What is the last grade  level you completed in school?: GED  Interpreter Needed?: No  Comments: pt and sister-in-law live together Information entered by :: LPinson, LPN  Past Medical History:  Diagnosis Date  . Cervical cancer ([place])   . Chronic idiopathic constipation   . COPD (chronic obstructive pulmonary disease) ([place])   . Depression   . Endometriosis   . HLD (hyperlipidemia)    Past Surgical History:  Procedure Laterality Date  . ABLATION ON ENDOMETRIOSIS    . APPENDECTOMY    . BREAST BIOPSY Left    benign biopsy - chronic fatty deposit  . COLONOSCOPY WITH PROPOFOL N/A 08/04/2017   TAx3, angiodysplastic lesion, diverticulosis, rpt 5 yrs ([place])  . EXPLORATORY LAPAROTOMY  1992   endometriosis  . FOOT SURGERY Right    needle imbeded  . LASER ABLATION CONDYLOMA CERVICAL / VULVAR    . NASAL SEPTUM SURGERY Bilateral   . OVARIAN CYST SURGERY Bilateral 1980   remote  . SALPINGOOPHORECTOMY Right 1983   ectopic pregnancy  . TONSILLECTOMY     Family History  Problem Relation Age of Onset  . Heart disease Mother   . Hypertension Mother   . Hypertension Father   . COPD Father   . Hearing loss Maternal Grandmother   . Hypertension Maternal Grandmother   . Hearing loss Maternal Grandfather   . Hypertension Maternal Grandfather   . Colon cancer Paternal Grandmother  Social History   Socioeconomic History  . Marital status: Legally Separated    Spouse name: Not on file  . Number of children: 0  . Years of education: Not on file  . Highest education level: Not on file  Occupational History  . Occupation: disabled  Social Needs  . Financial resource strain: Not on file  . Food insecurity:    Worry: Not on file    Inability: Not on file  . Transportation needs:    Medical: Not on file    Non-medical: Not on file  Tobacco Use  . Smoking status: Current Some Day Smoker    Packs/day: 0.50    Years: 47.00    Pack years: 23.50    Types: Cigarettes  . Smokeless tobacco: Never Used    . Tobacco comment: Trying to quit smoking  Substance and Sexual Activity  . Alcohol use: Yes    Comment: occasional  . Drug use: No  . Sexual activity: Never  Lifestyle  . Physical activity:    Days per week: Not on file    Minutes per session: Not on file  . Stress: Not on file  Relationships  . Social connections:    Talks on phone: Not on file    Gets together: Not on file    Attends religious service: Not on file    Active member of club or organization: Not on file    Attends meetings of clubs or organizations: Not on file    Relationship status: Not on file  Other Topics Concern  . 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 aspirin EC 81 MG tablet Take 81 mg by mouth daily.  . benzonatate (TESSALON) 100 MG capsule Take 1 capsule (100 mg total) by mouth 2 (two) times daily as needed for cough.  . Cholecalciferol (VITAMIN D) 2000 units tablet Take 2,000 Units by mouth daily.  . clotrimazole (LOTRIMIN) 1 % cream Apply 1 application topically 2 (two) times daily. For 2 weeks to rash  . fluticasone (FLONASE) 50 MCG/ACT nasal spray Place 2 sprays into both nostrils daily.  Marland Kitchen gabapentin (NEURONTIN) 100 MG capsule Take one tablet nightly for 4 days then twice daily for 4 days then three times daily (Patient taking differently: Take 100 mg by mouth 3 (three) times daily. )  . hydrOXYzine (ATARAX/VISTARIL) 25 MG tablet Take 0.5 tablets (12.5 mg total) by mouth 2 (two) times daily as needed for anxiety.  Marland Kitchen ipratropium-albuterol (DUONEB) 0.5-2.5 (3) MG/3ML SOLN Inhale 3 mLs into the lungs every 6 (six) hours as needed.  Marland Kitchen LINZESS 290 MCG CAPS capsule Take 1 capsule (290 mcg total) by mouth daily.  . Multiple Vitamins-Minerals  (MULTIVITAMIN ADULT) TABS Take 1 tablet by mouth daily.  . Na Sulfate-K Sulfate-Mg Sulf (SUPREP BOWEL PREP KIT) 17.5-3.13-1.6 GM/177ML SOLN Take 1 kit by mouth as directed.  . naproxen (NAPROSYN) 375 MG tablet Take 1 tablet (375 mg total) by mouth 2 (two) times daily as needed for moderate pain.  . OXYGEN Inhale 3 L/min into the lungs continuous.  . sertraline (ZOLOFT) 100 MG tablet Take 1.5 tablets (150 mg total) by mouth daily.  . SYMBICORT 160-4.5 MCG/ACT inhaler Inhale 2 puffs into the lungs 2 (  two) times daily.  Marland Kitchen tiotropium (SPIRIVA) 18 MCG inhalation capsule Place 1 capsule (18 mcg total) into inhaler and inhale daily.  . traZODone (DESYREL) 50 MG tablet Take 0.5 tablets (25 mg total) by mouth at bedtime as needed for sleep.  . VENTOLIN HFA 108 (90 Base) MCG/ACT inhaler INHALE 2 PUFFS INTO THE LUNGS EVERY 6 (SIX) HOURS AS NEEDED FOR SHORTNESS OF BREATH.  . vitamin B-12 (CYANOCOBALAMIN) 1000 MCG tablet Take 1,000 mcg by mouth daily.  . vitamin E 400 UNIT capsule Take 400 Units by mouth daily.    No facility-administered encounter medications on file as of 02/06/2018.     Activities of Daily Living In your present state of health, do you have any difficulty performing the following activities: 02/06/2018  Hearing? N  Vision? N  Difficulty concentrating or making decisions? N  Walking or climbing stairs? Y  Dressing or bathing? N  Doing errands, shopping? N  Preparing Food and eating ? N  Using the Toilet? N  In the past six months, have you accidently leaked urine? Y  Do you have problems with loss of bowel control? N  Managing your Medications? N  Managing your Finances? N  Housekeeping or managing your Housekeeping? N  Some recent data might be hidden    Patient Care Team: Ria Bush, MD as PCP - General (Family Medicine)    Assessment:   This is a routine wellness examination for Brendi.   Hearing Screening   _0  _1  _2  _3  _4  _5  _6  _7   _8   Right ear:   40 40 40  40    Left ear:   40 40 40  40    Vision Screening Comments: Unable to complete; does not have corrective lenses    Exercise Activities and Dietary recommendations Current Exercise Habits: The patient does not participate in regular exercise at present, Exercise limited by: respiratory conditions(s)  Goals    . Patient Stated     Starting 02/06/2018, I will continue to take medications as prescribed.        Fall Risk Fall Risk  02/06/2018 02/05/2017 06/11/2016  Falls in the past year? No No No   Depression Screen PHQ 2/9 Scores 02/06/2018 08/18/2017 07/07/2017 02/05/2017  PHQ - 2 Score _9 PHQ- 9 Score _10 Cognitive Function MMSE - Mini Mental State Exam 02/06/2018 02/05/2017  Orientation to time 5 5  Orientation to Place 5 5  Registration 3 3  Attention/ Calculation 0 0  Recall 3 3  Language- name 2 objects 0 0  Language- repeat 1 1  Language- follow 3 step command 3 3  Language- read & follow direction 0 0  Write a sentence 0 0  Copy design 0 0  Total score 20 20     PLEASE NOTE: A Mini-Cog screen was completed. Maximum score is 20. A value of 0 denotes this part of [place] MMSE was not completed or the patient failed this part of the Mini-Cog screening.   Mini-Cog Screening Orientation to Time - Max 5 pts Orientation to Place - Max 5 pts Registration - Max 3 pts Recall - Max 3 pts Language Repeat - Max 1 pts Language Follow 3 Step Command - Max 3 pts     Immunization History  Administered Date(s) Administered  . Influenza Split 04/17/2016  . Influenza,inj,Quad PF,6+ Mos 02/21/2017  . Pneumococcal Polysaccharide-23 06/17/2008    Screening Tests Health Maintenance  Topic Date  Due  . INFLUENZA VACCINE  09/16/2018 (Originally 01/15/2018)  . DTaP/Tdap/Td (1 - Tdap) 02/06/2027 (Originally 01/03/1978)  . TETANUS/TDAP  02/06/2027 (Originally 01/03/1978)  . PAP SMEAR  02/22/2019  . MAMMOGRAM  05/08/2019  . COLONOSCOPY   08/04/2022  . Hepatitis C Screening  Completed  . HIV Screening  Completed      Plan:     I have personally reviewed, addressed, and noted the following in the patient's chart:  A. Medical and social history B. Use of alcohol, tobacco or illicit drugs  C. Current medications and supplements D. Functional ability and status E.  Nutritional status F.  Physical activity G. Advance directives H. List of other physicians I.  Hospitalizations, surgeries, and ER visits in previous 12 months J.  [place] to include hearing, vision, cognitive, depression L. Referrals and appointments - none  In addition, I have reviewed and discussed with patient certain preventive protocols, quality metrics, and best practice recommendations. A written personalized care plan for preventive services as well as general preventive health recommendations were provided to patient.  See attached scanned questionnaire for additional information.   Signed,   Lindell Noe, MHA, BS, LPN Health Coach

## 2018-02-06 NOTE — Telephone Encounter (Signed)
Copied from [place] [id number]. Topic: Quick Communication - See Telephone Encounter >> Feb 06, 2018  4:14 PM Blase Mess A wrote: CRM for notification. See Telephone encounter for: 02/06/18. Patient called requesting a scooter. Per the patient Dr. Darnell Level said that the pulmonary doctor could assist her will that. Dr Duane Lope [facility] [place] [place], [place] [address] [phone] said that he would be glad to assist but he did needs to speak with Dr. Darnell Level.  Please advise. Patient request call back. [phone]

## 2018-02-06 NOTE — Telephone Encounter (Signed)
Returned call and made patient aware it may take a couple of weeks. Referral has been placed and sometimes insurance authorizations hold up process.

## 2018-02-12 NOTE — Telephone Encounter (Signed)
Pt checking status on receiving a scooter.

## 2018-02-17 NOTE — Telephone Encounter (Signed)
Spoke with pt relaying Dr. Synthia Innocent message. Pt verbalizes understanding and says gets everything else through [facility].  And she is requesting a lightweight scooter that she can get in and out of her vehicle.

## 2018-02-17 NOTE — Telephone Encounter (Signed)
Christine Serrano - once finds out what company she wants to use, will need to schedule 30 min mobility evaluation in office, and bring to that appt the paper work we need to review and fill out.

## 2018-02-17 NOTE — Telephone Encounter (Addendum)
I touched base with pulm - reasonable to pursue. plz have her touch base with her insurance to find out what company they prefer to use for power vehicles (ie hover round, [place] [employer], etc).  From a medical standpoint, she would be eligible for a scooter, not for a power wheelchair. Would see if her insurance would cover scooter.

## 2018-02-19 DIAGNOSIS — J449 Chronic obstructive pulmonary disease, unspecified: Secondary | ICD-10-CM | POA: Diagnosis not present

## 2018-02-20 NOTE — Telephone Encounter (Signed)
Spoke with pt about what company she wants to get scooter from. Pt wants to use [employer].

## 2018-02-23 ENCOUNTER — Encounter: Payer: Medicare HMO | Admitting: Family Medicine

## 2018-02-24 NOTE — Telephone Encounter (Signed)
Left message for pt to call back.  Need to relay Dr. G's message.  

## 2018-02-25 NOTE — Telephone Encounter (Signed)
Spoke with pt relaying message per Dr. Darnell Level.  Pt verbalizes understanding.  Says she will see about getting a form from [facility] and will then call back to schedule a "30 min mobility eval" OV per Dr. Darnell Level.

## 2018-03-09 ENCOUNTER — Encounter: Payer: Medicare HMO | Attending: Pulmonary Disease

## 2018-03-09 ENCOUNTER — Other Ambulatory Visit: Payer: Self-pay

## 2018-03-09 ENCOUNTER — Telehealth: Payer: Self-pay | Admitting: Family Medicine

## 2018-03-09 VITALS — Ht 64.8 in | Wt 111.7 lb

## 2018-03-09 DIAGNOSIS — J449 Chronic obstructive pulmonary disease, unspecified: Secondary | ICD-10-CM | POA: Insufficient documentation

## 2018-03-09 DIAGNOSIS — Z7951 Long term (current) use of inhaled steroids: Secondary | ICD-10-CM | POA: Diagnosis not present

## 2018-03-09 DIAGNOSIS — Z7982 Long term (current) use of aspirin: Secondary | ICD-10-CM | POA: Insufficient documentation

## 2018-03-09 DIAGNOSIS — F17210 Nicotine dependence, cigarettes, uncomplicated: Secondary | ICD-10-CM | POA: Insufficient documentation

## 2018-03-09 DIAGNOSIS — Z79899 Other long term (current) drug therapy: Secondary | ICD-10-CM | POA: Insufficient documentation

## 2018-03-09 NOTE — Progress Notes (Signed)
Pulmonary Individual Treatment Plan  Patient Details  Name: Christine Serrano MRN: [id number] Date of Birth: 1960/07/06 Referring Provider:     Pulmonary Rehab from 03/09/2018 in [facility] Cardiac and Pulmonary Rehab  Referring Provider  Merton Border MD      Initial Encounter Date:    Pulmonary Rehab from 03/09/2018 in [facility] Cardiac and Pulmonary Rehab  Date  03/09/18      Visit Diagnosis: COPD, severe ([place])  Patient's Home Medications on Admission:  Current Outpatient Medications:  .  aspirin EC 81 MG tablet, Take 81 mg by mouth daily., Disp: , Rfl:  .  benzonatate (TESSALON) 100 MG capsule, Take 1 capsule (100 mg total) by mouth 2 (two) times daily as needed for cough., Disp: 30 capsule, Rfl: 3 .  Cholecalciferol (VITAMIN D) 2000 units tablet, Take 2,000 Units by mouth daily., Disp: , Rfl:  .  clotrimazole (LOTRIMIN) 1 % cream, Apply 1 application topically 2 (two) times daily. For 2 weeks to rash, Disp: 60 g, Rfl: 0 .  fluticasone (FLONASE) 50 MCG/ACT nasal spray, Place 2 sprays into both nostrils daily., Disp: 16 g, Rfl: 3 .  gabapentin (NEURONTIN) 100 MG capsule, Take one tablet nightly for 4 days then twice daily for 4 days then three times daily (Patient taking differently: Take 100 mg by mouth 3 (three) times daily. ), Disp: 90 capsule, Rfl: 3 .  hydrOXYzine (ATARAX/VISTARIL) 25 MG tablet, Take 0.5 tablets (12.5 mg total) by mouth 2 (two) times daily as needed for anxiety., Disp: 90 tablet, Rfl: 1 .  ipratropium-albuterol (DUONEB) 0.5-2.5 (3) MG/3ML SOLN, Inhale 3 mLs into the lungs every 6 (six) hours as needed., Disp: 360 mL, Rfl: 3 .  LINZESS 290 MCG CAPS capsule, Take 1 capsule (290 mcg total) by mouth daily., Disp: 90 capsule, Rfl: 3 .  Multiple Vitamins-Minerals (MULTIVITAMIN ADULT) TABS, Take 1 tablet by mouth daily., Disp: , Rfl:  .  Na Sulfate-K Sulfate-Mg Sulf (SUPREP BOWEL PREP KIT) 17.5-3.13-1.6 GM/177ML SOLN, Take 1 kit by mouth as directed., Disp: 324 mL, Rfl: 0 .   naproxen (NAPROSYN) 375 MG tablet, Take 1 tablet (375 mg total) by mouth 2 (two) times daily as needed for moderate pain., Disp: 40 tablet, Rfl: 1 .  OXYGEN, Inhale 3 L/min into the lungs continuous., Disp: , Rfl:  .  sertraline (ZOLOFT) 100 MG tablet, Take 1.5 tablets (150 mg total) by mouth daily., Disp: 135 tablet, Rfl: 3 .  SYMBICORT 160-4.5 MCG/ACT inhaler, Inhale 2 puffs into the lungs 2 (two) times daily., Disp: 3 Inhaler, Rfl: 3 .  tiotropium (SPIRIVA) 18 MCG inhalation capsule, Place 1 capsule (18 mcg total) into inhaler and inhale daily., Disp: 90 capsule, Rfl: 4 .  traZODone (DESYREL) 50 MG tablet, Take 0.5 tablets (25 mg total) by mouth at bedtime as needed for sleep., Disp: 45 tablet, Rfl: 3 .  VENTOLIN HFA 108 (90 Base) MCG/ACT inhaler, INHALE 2 PUFFS INTO THE LUNGS EVERY 6 (SIX) HOURS AS NEEDED FOR SHORTNESS OF BREATH., Disp: 72 g, Rfl: 0 .  vitamin B-12 (CYANOCOBALAMIN) 1000 MCG tablet, Take 1,000 mcg by mouth daily., Disp: , Rfl:  .  vitamin E 400 UNIT capsule, Take 400 Units by mouth daily. , Disp: , Rfl:   Past Medical History: Past Medical History:  Diagnosis Date  . Cervical cancer ([place])   . Chronic idiopathic constipation   . COPD (chronic obstructive pulmonary disease) ([place])   . Depression   . Endometriosis   . HLD (hyperlipidemia)  Tobacco Use: Social History   Tobacco Use  Smoking Status Current Some Day Smoker  . Packs/day: 1.00  . Years: 47.00  . Pack years: 47.00  . Types: Cigarettes  Smokeless Tobacco Never Used  Tobacco Comment   Trying to quit smoking    Labs: Recent Review Flowsheet Data    Labs for ITP Cardiac and Pulmonary Rehab Latest Ref Rng & Units 04/04/2009 04/09/2016 02/05/2017 02/06/2018   Cholestrol 0 - 200 mg/dL - - 207(H) 181   LDLCALC 0 - 99 mg/dL - - 137(H) 105(H)   HDL >39.00 mg/dL - - 43.60 45.30   Trlycerides 0.0 - 149.0 mg/dL - - 133.0 153.0(H)   PHART 7.350 - 7.450 7.302(L) 7.30(L) - -   PCO2ART 32.0 - 48.0 mmHg 59.5  CRITICAL RESULT CALLED TO, READ BACK BY AND VERIFIED WITH: [facility] 04/04/09 AT 1440 CRITICAL RESULT CALLED TO, READ BACK BY AND VERIFIED WITH: [facility] 04/04/09 AT 1440 BY BROADNAX,L.RRT(HH) 65(H) - -   HCO3 20.0 - 28.0 mmol/L 28.5(H) 32.0(H) - -   TCO2 0 - 100 mmol/L 25.9 - - -   O2SAT % 95.5 88.7 - -       Pulmonary Assessment Scores: Pulmonary Assessment Scores    Row Name 03/09/18 1511         ADL UCSD   ADL Phase  Entry     SOB Score total  91     Rest  1     Walk  3     Stairs  5     Bath  4     Dress  3     Shop  5       CAT Score   CAT Score  31       mMRC Score   mMRC Score  4        Pulmonary Function Assessment: Pulmonary Function Assessment - 03/09/18 1510      Initial Spirometry Results   FVC%  48 %    FEV1%  31 %    FEV1/FVC Ratio  53    Comments  Test date 01/15/18      Post Bronchodilator Spirometry Results   FVC%  51 %    FEV1%  29 %    FEV1/FVC Ratio  46    Comments  Test date 01/15/18      Breath   Bilateral Breath Sounds  Wheezes    Shortness of Breath  Yes;Limiting activity;Fear of Shortness of Breath       Exercise Target Goals: Exercise Program Goal: Individual exercise prescription set using results from initial 6 min walk test and THRR while considering  patient's activity barriers and safety.   Exercise Prescription Goal: Initial exercise prescription builds to 30-45 minutes a day of aerobic activity, 2-3 days per week.  Home exercise guidelines will be given to patient during program as part of exercise prescription that the participant will acknowledge.  Activity Barriers & Risk Stratification: Activity Barriers & Cardiac Risk Stratification - 03/09/18 1526      Activity Barriers & Cardiac Risk Stratification   Activity Barriers  Balance Concerns;Deconditioning;Muscular Weakness;Shortness of Breath       6 Minute Walk: 6 Minute Walk    Row Name 03/09/18 1523         6 Minute Walk   Phase  Initial     Distance   365 feet     Walk Time  2 minutes     # of Rest Breaks  0     MPH  2.07     METS  2.59     RPE  18     Perceived Dyspnea   4     VO2 Peak  9.05     Symptoms  Yes (comment)     Comments  SOB, weakness     Resting HR  110 bpm     Resting BP  126/64     Resting Oxygen Saturation   94 %     Exercise Oxygen Saturation  during 6 min walk  76 %     Max Ex. HR  126 bpm     Max Ex. BP  134/84     2 Minute Post BP  126/66       Interval HR   1 Minute HR  123     2 Minute HR  126     2 Minute Post HR  116     Interval Heart Rate?  Yes       Interval Oxygen   Interval Oxygen?  Yes     Baseline Oxygen Saturation %  94 %     1 Minute Oxygen Saturation %  85 %     1 Minute Liters of Oxygen  2 L pulsed     2 Minute Oxygen Saturation %  76 % test terminated at 2 min     2 Minute Liters of Oxygen  2 L     2 Minute Post Oxygen Saturation %  93 %     2 Minute Post Liters of Oxygen  2 L       Oxygen Initial Assessment: Oxygen Initial Assessment - 03/09/18 1509      Home Oxygen   Home Oxygen Device  Home Concentrator;Portable Concentrator;E-Tanks    Sleep Oxygen Prescription  Continuous    Liters per minute  2    Home Exercise Oxygen Prescription  Continuous    Liters per minute  2    Home at Rest Exercise Oxygen Prescription  Continuous    Liters per minute  2    Compliance with Home Oxygen Use  Yes      Initial 6 min Walk   Oxygen Used  Pulsed    Liters per minute  2      Program Oxygen Prescription   Program Oxygen Prescription  Continuous;E-Tanks    Liters per minute  2      Intervention   Short Term Goals  To learn and exhibit compliance with exercise, home and travel O2 prescription;To learn and understand importance of monitoring SPO2 with pulse oximeter and demonstrate accurate use of the pulse oximeter.;To learn and understand importance of maintaining oxygen saturations>88%;To learn and demonstrate proper use of respiratory medications;To learn and demonstrate proper  pursed lip breathing techniques or other breathing techniques.    Long  Term Goals  Exhibits compliance with exercise, home and travel O2 prescription;Verbalizes importance of monitoring SPO2 with pulse oximeter and return demonstration;Maintenance of O2 saturations>88%;Exhibits proper breathing techniques, such as pursed lip breathing or other method taught during program session;Compliance with respiratory medication;Demonstrates proper use of MDI's       Oxygen Re-Evaluation:   Oxygen Discharge (Final Oxygen Re-Evaluation):   Initial Exercise Prescription: Initial Exercise Prescription - 03/09/18 1500      Date of Initial Exercise RX and Referring Provider   Date  03/09/18    Referring Provider  Merton Border MD      Oxygen   Oxygen  Continuous    Liters  3      Treadmill   MPH  2    Grade  0    Minutes  15    METs  2.53      Arm Ergometer   Level  1    Watts  22    RPM  25    Minutes  15    METs  2      T5 Nustep   Level  1    SPM  80    Minutes  15    METs  2      Prescription Details   Frequency (times per week)  3    Duration  Progress to 45 minutes of aerobic exercise without signs/symptoms of physical distress      Intensity   THRR 40-80% of Max Heartrate  130-151    Ratings of Perceived Exertion  11-13    Perceived Dyspnea  0-4      Progression   Progression  Continue to progress workloads to maintain intensity without signs/symptoms of physical distress.      Resistance Training   Training Prescription  Yes    Weight  3 lbs    Reps  10-15       Perform Capillary Blood Glucose checks as needed.  Exercise Prescription Changes: Exercise Prescription Changes    Row Name 03/09/18 1500             Response to Exercise   Blood Pressure (Admit)  126/64       Blood Pressure (Exercise)  134/84       Blood Pressure (Exit)  126/66       Heart Rate (Admit)  110 bpm       Heart Rate (Exercise)  126 bpm       Heart Rate (Exit)  116 bpm        Oxygen Saturation (Admit)  94 %       Oxygen Saturation (Exercise)  76 %       Oxygen Saturation (Exit)  93 %       Rating of Perceived Exertion (Exercise)  18       Perceived Dyspnea (Exercise)  4       Symptoms  SOB, weakness       Comments  walk test results          Exercise Comments:   Exercise Goals and Review: Exercise Goals    Row Name 03/09/18 1532             Exercise Goals   Increase Physical Activity  Yes       Intervention  Provide advice, education, support and counseling about physical activity/exercise needs.;Develop an individualized exercise prescription for aerobic and resistive training based on initial evaluation findings, risk stratification, comorbidities and participant's personal goals.       Expected Outcomes  Short Term: Attend rehab on a regular basis to increase amount of physical activity.;Long Term: Add in home exercise to make exercise part of routine and to increase amount of physical activity.;Long Term: Exercising regularly at least 3-5 days a week.       Increase Strength and Stamina  Yes       Intervention  Provide advice, education, support and counseling about physical activity/exercise needs.;Develop an individualized exercise prescription for aerobic and resistive training based on initial evaluation findings, risk stratification, comorbidities and participant's personal goals.       Expected Outcomes  Short Term:  Increase workloads from initial exercise prescription for resistance, speed, and METs.;Long Term: Improve cardiorespiratory fitness, muscular endurance and strength as measured by increased METs and functional capacity (6MWT);Short Term: Perform resistance training exercises routinely during rehab and add in resistance training at home       Able to understand and use rate of perceived exertion (RPE) scale  Yes       Intervention  Provide education and explanation on how to use RPE scale       Expected Outcomes  Short Term: Able to use  RPE daily in rehab to express subjective intensity level;Long Term:  Able to use RPE to guide intensity level when exercising independently       Able to understand and use Dyspnea scale  Yes       Intervention  Provide education and explanation on how to use Dyspnea scale       Expected Outcomes  Short Term: Able to use Dyspnea scale daily in rehab to express subjective sense of shortness of breath during exertion;Long Term: Able to use Dyspnea scale to guide intensity level when exercising independently       Knowledge and understanding of Target Heart Rate Range (THRR)  Yes       Intervention  Provide education and explanation of THRR including how the numbers were predicted and where they are located for reference       Expected Outcomes  Short Term: Able to state/look up THRR;Short Term: Able to use daily as guideline for intensity in rehab;Long Term: Able to use THRR to govern intensity when exercising independently       Able to check pulse independently  Yes       Intervention  Provide education and demonstration on how to check pulse in carotid and radial arteries.;Review the importance of being able to check your own pulse for safety during independent exercise       Expected Outcomes  Short Term: Able to explain why pulse checking is important during independent exercise;Long Term: Able to check pulse independently and accurately       Understanding of Exercise Prescription  Yes       Intervention  Provide education, explanation, and written materials on patient's individual exercise prescription       Expected Outcomes  Short Term: Able to explain program exercise prescription;Long Term: Able to explain home exercise prescription to exercise independently          Exercise Goals Re-Evaluation :   Discharge Exercise Prescription (Final Exercise Prescription Changes): Exercise Prescription Changes - 03/09/18 1500      Response to Exercise   Blood Pressure (Admit)  126/64    Blood  Pressure (Exercise)  134/84    Blood Pressure (Exit)  126/66    Heart Rate (Admit)  110 bpm    Heart Rate (Exercise)  126 bpm    Heart Rate (Exit)  116 bpm    Oxygen Saturation (Admit)  94 %    Oxygen Saturation (Exercise)  76 %    Oxygen Saturation (Exit)  93 %    Rating of Perceived Exertion (Exercise)  18    Perceived Dyspnea (Exercise)  4    Symptoms  SOB, weakness    Comments  walk test results       Nutrition:  Target Goals: Understanding of nutrition guidelines, daily intake of sodium '1500mg'$ , cholesterol '200mg'$ , calories 30% from fat and 7% or less from saturated fats, daily to have 5 or more servings of fruits  and vegetables.  Biometrics: Pre Biometrics - 03/09/18 1532      Pre Biometrics   Height  5' 4.8" (1.646 m)    Weight  111 lb 11.2 oz (50.7 kg)    Waist Circumference  30 inches    Hip Circumference  36 inches    Waist to Hip Ratio  0.83 %    BMI (Calculated)  18.7    Single Leg Stand  2.56 seconds        Nutrition Therapy Plan and Nutrition Goals: Nutrition Therapy & Goals - 03/09/18 1518      Personal Nutrition Goals   Nutrition Goal  She wants to learn to eat better    Personal Goal #2  She wants to try to gain a little weight    Comments  She states her diet is not good and needs to make a change      Intervention Plan   Intervention  Prescribe, educate and counsel regarding individualized specific dietary modifications aiming towards targeted core components such as weight, hypertension, lipid management, diabetes, heart failure and other comorbidities.;Nutrition handout(s) given to patient.    Expected Outcomes  Short Term Goal: Understand basic principles of dietary content, such as calories, fat, sodium, cholesterol and nutrients.;Long Term Goal: Adherence to prescribed nutrition plan.       Nutrition Assessments: Nutrition Assessments - 03/09/18 1515      MEDFICTS Scores   Pre Score  102       Nutrition Goals Re-Evaluation:   Nutrition  Goals Discharge (Final Nutrition Goals Re-Evaluation):   Psychosocial: Target Goals: Acknowledge presence or absence of significant depression and/or stress, maximize coping skills, provide positive support system. Participant is able to verbalize types and ability to use techniques and skills needed for reducing stress and depression.   Initial Review & Psychosocial Screening: Initial Psych Review & Screening - 03/09/18 1516      Initial Review   Current issues with  Current Depression;History of Depression;Current Sleep Concerns;Current Stress Concerns    Source of Stress Concerns  Chronic Illness;Unable to perform yard/household activities;Unable to participate in former interests or hobbies    Comments  She watns to quit smoking and she is not able to do anyting in the yeard or in the house. She wants to paint her house and has not been able to live like she wants to. She needs to get a lung transplant but needs to stop smoking.      Family Dynamics   Good Support System?  No    Comments  She can look to her late fiances sister who lives with her.      Barriers   Psychosocial barriers to participate in program  The patient should benefit from training in stress management and relaxation.      Screening Interventions   Interventions  Program counselor consult;Encouraged to exercise;To provide support and resources with identified psychosocial needs;Provide feedback about the scores to participant    Expected Outcomes  Short Term goal: Utilizing psychosocial counselor, staff and physician to assist with identification of specific Stressors or current issues interfering with healing process. Setting desired goal for each stressor or current issue identified.;Long Term Goal: Stressors or current issues are controlled or eliminated.;Short Term goal: Identification and review with participant of any Quality of Life or Depression concerns found by scoring the questionnaire.;Long Term goal: The  participant improves quality of Life and PHQ9 Scores as seen by post scores and/or verbalization of changes  Quality of Life Scores:  Scores of 19 and below usually indicate a poorer quality of life in these areas.  A difference of  2-3 points is a clinically meaningful difference.  A difference of 2-3 points in the total score of the Quality of Life Index has been associated with significant improvement in overall quality of life, self-image, physical symptoms, and general health in studies assessing change in quality of life.  PHQ-9: Recent Review Flowsheet Data    Depression screen [facility] 2/9 03/09/2018 02/06/2018 08/18/2017 07/07/2017 02/05/2017   Decreased Interest '2 1 1 3 1   '$ Down, Depressed, Hopeless 3 3 0 2 1   PHQ - 2 Score '5 4 1 5 2   '$ Altered sleeping '3 1 1 3 1   '$ Tired, decreased energy '3 3 1 3 2   '$ Change in appetite 2 1 0 2 0   Feeling bad or failure about yourself  1 1 0 0 0   Trouble concentrating 1 0 1 1 0   Moving slowly or fidgety/restless 1 0 0 1 0   Suicidal thoughts 0 0 0 0 0   PHQ-9 Score '16 10 4 15 5   '$ Difficult doing work/chores Extremely dIfficult Very difficult - - Somewhat difficult     Interpretation of Total Score  Total Score Depression Severity:  1-4 = Minimal depression, 5-9 = Mild depression, 10-14 = Moderate depression, 15-19 = Moderately severe depression, 20-27 = Severe depression   Psychosocial Evaluation and Intervention:   Psychosocial Re-Evaluation:   Psychosocial Discharge (Final Psychosocial Re-Evaluation):   Education: Education Goals: Education classes will be provided on a weekly basis, covering required topics. Participant will state understanding/return demonstration of topics presented.  Learning Barriers/Preferences: Learning Barriers/Preferences - 03/09/18 1515      Learning Barriers/Preferences   Learning Barriers  None    Learning Preferences  None       Education Topics:  Initial Evaluation Education: - Verbal,  written and demonstration of respiratory meds, oximetry and breathing techniques. Instruction on use of nebulizers and MDIs and importance of monitoring MDI activations.   Pulmonary Rehab from 03/09/2018 in [facility] Cardiac and Pulmonary Rehab  Date  03/09/18  Educator  [facility]  Instruction Review Code  1- Verbalizes Understanding      General Nutrition Guidelines/Fats and Fiber: -Group instruction provided by verbal, written material, models and posters to present the general guidelines for heart healthy nutrition. Gives an explanation and review of dietary fats and fiber.   Controlling Sodium/Reading Food Labels: -Group verbal and written material supporting the discussion of sodium use in heart healthy nutrition. Review and explanation with models, verbal and written materials for utilization of the food label.   Exercise Physiology & General Exercise Guidelines: - Group verbal and written instruction with models to review the exercise physiology of the cardiovascular system and associated critical values. Provides general exercise guidelines with specific guidelines to those with heart or lung disease.    Aerobic Exercise & Resistance Training: - Gives group verbal and written instruction on the various components of exercise. Focuses on aerobic and resistive training programs and the benefits of this training and how to safely progress through these programs.   Flexibility, Balance, Mind/Body Relaxation: Provides group verbal/written instruction on the benefits of flexibility and balance training, including mind/body exercise modes such as yoga, pilates and tai chi.  Demonstration and skill practice provided.   Stress and Anxiety: - Provides group verbal and written instruction about the health risks of elevated stress  and causes of high stress.  Discuss the correlation between heart/lung disease and anxiety and treatment options. Review healthy ways to manage with stress and  anxiety.   Depression: - Provides group verbal and written instruction on the correlation between heart/lung disease and depressed mood, treatment options, and the stigmas associated with seeking treatment.   Exercise & Equipment Safety: - Individual verbal instruction and demonstration of equipment use and safety with use of the equipment.   Pulmonary Rehab from 03/09/2018 in [facility] Cardiac and Pulmonary Rehab  Date  03/09/18  Educator  [facility]  Instruction Review Code  1- Verbalizes Understanding      Infection Prevention: - Provides verbal and written material to individual with discussion of infection control including proper hand washing and proper equipment cleaning during exercise session.   Pulmonary Rehab from 03/09/2018 in [facility] Cardiac and Pulmonary Rehab  Date  03/09/18  Educator  [facility]  Instruction Review Code  1- Verbalizes Understanding      Falls Prevention: - Provides verbal and written material to individual with discussion of falls prevention and safety.   Pulmonary Rehab from 03/09/2018 in [facility] Cardiac and Pulmonary Rehab  Date  03/09/18  Educator  [facility]  Instruction Review Code  1- Verbalizes Understanding      Diabetes: - Individual verbal and written instruction to review signs/symptoms of diabetes, desired ranges of glucose level fasting, after meals and with exercise. Advice that pre and post exercise glucose checks will be done for 3 sessions at entry of program.   Chronic Lung Diseases: - Group verbal and written instruction to review updates, respiratory medications, advancements in procedures and treatments. Discuss use of supplemental oxygen including available portable oxygen systems, continuous and intermittent flow rates, concentrators, personal use and safety guidelines. Review proper use of inhaler and spacers. Provide informative websites for self-education.    Energy Conservation: - Provide group verbal and written instruction for methods to conserve  energy, plan and organize activities. Instruct on pacing techniques, use of adaptive equipment and posture/positioning to relieve shortness of breath.   Triggers and Exacerbations: - Group verbal and written instruction to review types of environmental triggers and ways to prevent exacerbations. Discuss weather changes, air quality and the benefits of nasal washing. Review warning signs and symptoms to help prevent infections. Discuss techniques for effective airway clearance, coughing, and vibrations.   Pulmonary Rehab from 08/07/2016 in [facility] Cardiac and Pulmonary Rehab  Date  08/07/16  Educator  [facility]  Instruction Review Code (retired)  2- meets goals/outcomes      AED/CPR: - Group verbal and written instruction with the use of models to demonstrate the basic use of the AED with the basic ABC's of resuscitation.   Anatomy and Physiology of the Lungs: - Group verbal and written instruction with the use of models to provide basic lung anatomy and physiology related to function, structure and complications of lung disease.   Anatomy & Physiology of the Heart: - Group verbal and written instruction and models provide basic cardiac anatomy and physiology, with the coronary electrical and arterial systems. Review of Valvular disease and Heart Failure   Cardiac Medications: - Group verbal and written instruction to review commonly prescribed medications for heart disease. Reviews the medication, class of the drug, and side effects.   Know Your Numbers and Risk Factors: -Group verbal and written instruction about important numbers in your health.  Discussion of what are risk factors and how they play a role in the disease process.  Review of  Cholesterol, Blood Pressure, Diabetes, and BMI and the role they play in your overall health.   Sleep Hygiene: -Provides group verbal and written instruction about how sleep can affect your health.  Define sleep hygiene, discuss sleep cycles and impact of  sleep habits. Review good sleep hygiene tips.    Other: -Provides group and verbal instruction on various topics (see comments)    Knowledge Questionnaire Score: Knowledge Questionnaire Score - 03/09/18 1515      Knowledge Questionnaire Score   Pre Score  15/18   reviewed with patient        Core Components/Risk Factors/Patient Goals at Admission: Personal Goals and Risk Factors at Admission - 03/09/18 1519      Core Components/Risk Factors/Patient Goals on Admission    Weight Management  Yes;Weight Maintenance;Weight Gain    Intervention  Weight Management: Develop a combined nutrition and exercise program designed to reach desired caloric intake, while maintaining appropriate intake of nutrient and fiber, sodium and fats, and appropriate energy expenditure required for the weight goal.;Weight Management: Provide education and appropriate resources to help participant work on and attain dietary goals.    Admit Weight  111 lb 11.2 oz (50.7 kg)    Goal Weight: Short Term  120 lb (54.4 kg)    Goal Weight: Long Term  120 lb (54.4 kg)    Expected Outcomes  Weight Gain: Understanding of general recommendations for a high calorie, high protein meal plan that promotes weight gain by distributing calorie intake throughout the day with the consumption for 4-5 meals, snacks, and/or supplements;Short Term: Continue to assess and modify interventions until short term weight is achieved;Long Term: Adherence to nutrition and physical activity/exercise program aimed toward attainment of established weight goal;Understanding recommendations for meals to include 15-35% energy as protein, 25-35% energy from fat, 35-60% energy from carbohydrates, less than '200mg'$  of dietary cholesterol, 20-35 gm of total fiber daily;Understanding of distribution of calorie intake throughout the day with the consumption of 4-5 meals/snacks    Tobacco Cessation  Yes    Intervention  Assist the participant in steps to quit.  Provide individualized education and counseling about committing to Tobacco Cessation, relapse prevention, and pharmacological support that can be provided by physician.;Advice worker, assist with locating and accessing local/national Quit Smoking programs, and support quit date choice.    Expected Outcomes  Short Term: Will demonstrate readiness to quit, by selecting a quit date.;Short Term: Will quit all tobacco product use, adhering to prevention of relapse plan.;Long Term: Complete abstinence from all tobacco products for at least 12 months from quit date.    Improve shortness of breath with ADL's  Yes    Intervention  Provide education, individualized exercise plan and daily activity instruction to help decrease symptoms of SOB with activities of daily living.    Expected Outcomes  Short Term: Improve cardiorespiratory fitness to achieve a reduction of symptoms when performing ADLs;Long Term: Be able to perform more ADLs without symptoms or delay the onset of symptoms    Lipids  Yes    Intervention  Provide education and support for participant on nutrition & aerobic/resistive exercise along with prescribed medications to achieve LDL '70mg'$ , HDL >'40mg'$ .   she reports higher lipids.   Expected Outcomes  Short Term: Participant states understanding of desired cholesterol values and is compliant with medications prescribed. Participant is following exercise prescription and nutrition guidelines.;Long Term: Cholesterol controlled with medications as prescribed, with individualized exercise RX and with personalized nutrition plan. Value goals: LDL <  $'70mg'm$ , HDL > 40 mg.       Core Components/Risk Factors/Patient Goals Review:    Core Components/Risk Factors/Patient Goals at Discharge (Final Review):    ITP Comments: ITP Comments    Row Name 03/09/18 1600           ITP Comments  Medical Evaluation completed. Chart sent for review and changes to Dr. Emily Filbert Director of  [place]. Diagnosis can be found in [facility] encounter 01/23/18          Comments: Initial ITP

## 2018-03-09 NOTE — Telephone Encounter (Signed)
Copied from [place] [phone]. Topic: Quick Communication - Rx Refill/Question >> Mar 09, 2018  4:56 PM Oliver Pila B wrote: Reason for CRM: pt called b/c she spoke w/ [facility]; they asked that they need a Rx from pt's pcp in order for them to start working on the paperwork for the pt's mobile chair; contact pt if needed

## 2018-03-09 NOTE — Telephone Encounter (Signed)
I spoke with pt; pt said Dr Darnell Level has already spoken with pulmonary and pt contacted [facility] but was advised a rx would need to be sent to [facility] first and then they would supply necessary paper work and then schedule face to face appt for mobility eval. Pt seen 02/04/18 and was noted in that visit pt would need face to face eval.Please advise.

## 2018-03-09 NOTE — Patient Instructions (Signed)
Patient Instructions  Patient Details  Name: Christine Serrano MRN: [id number] Date of Birth: 1960/07/04 Referring Provider:  Wilhelmina Mcardle, MD  Below are your personal goals for exercise, nutrition, and risk factors. Our goal is to help you stay on track towards obtaining and maintaining these goals. We will be discussing your progress on these goals with you throughout the program.  Initial Exercise Prescription: Initial Exercise Prescription - 03/09/18 1500      Date of Initial Exercise RX and Referring Provider   Date  03/09/18    Referring Provider  Christine Border MD      Oxygen   Oxygen  Continuous    Liters  3      Treadmill   MPH  2    Grade  0    Minutes  15    METs  2.53      Arm Ergometer   Level  1    Watts  22    RPM  25    Minutes  15    METs  2      T5 Nustep   Level  1    SPM  80    Minutes  15    METs  2      Prescription Details   Frequency (times per week)  3    Duration  Progress to 45 minutes of aerobic exercise without signs/symptoms of physical distress      Intensity   THRR 40-80% of Max Heartrate  130-151    Ratings of Perceived Exertion  11-13    Perceived Dyspnea  0-4      Progression   Progression  Continue to progress workloads to maintain intensity without signs/symptoms of physical distress.      Resistance Training   Training Prescription  Yes    Weight  3 lbs    Reps  10-15       Exercise Goals: Frequency: Be able to perform aerobic exercise two to three times per week in program working toward 2-5 days per week of home exercise.  Intensity: Work with a perceived exertion of 11 (fairly light) - 15 (hard) while following your exercise prescription.  We will make changes to your prescription with you as you progress through the program.   Duration: Be able to do 30 to 45 minutes of continuous aerobic exercise in addition to a 5 minute warm-up and a 5 minute cool-down routine.   Nutrition Goals: Your personal nutrition  goals will be established when you do your nutrition analysis with the dietician.  The following are general nutrition guidelines to follow: Cholesterol < 200mg /day Sodium < 1500mg /day Fiber: Women over 50 yrs - 21 grams per day  Personal Goals: Personal Goals and Risk Factors at Admission - 03/09/18 1519      Core Components/Risk Factors/Patient Goals on Admission    Weight Management  Yes;Weight Maintenance;Weight Gain    Intervention  Weight Management: Develop a combined nutrition and exercise program designed to reach desired caloric intake, while maintaining appropriate intake of nutrient and fiber, sodium and fats, and appropriate energy expenditure required for the weight goal.;Weight Management: Provide education and appropriate resources to help participant work on and attain dietary goals.    Admit Weight  111 lb 11.2 oz (50.7 kg)    Goal Weight: Short Term  120 lb (54.4 kg)    Goal Weight: Long Term  120 lb (54.4 kg)    Expected Outcomes  Weight Gain: Understanding of general  recommendations for a high calorie, high protein meal plan that promotes weight gain by distributing calorie intake throughout the day with the consumption for 4-5 meals, snacks, and/or supplements;Short Term: Continue to assess and modify interventions until short term weight is achieved;Long Term: Adherence to nutrition and physical activity/exercise program aimed toward attainment of established weight goal;Understanding recommendations for meals to include 15-35% energy as protein, 25-35% energy from fat, 35-60% energy from carbohydrates, less than 200mg  of dietary cholesterol, 20-35 gm of total fiber daily;Understanding of distribution of calorie intake throughout the day with the consumption of 4-5 meals/snacks    Tobacco Cessation  Yes    Intervention  Assist the participant in steps to quit. Provide individualized education and counseling about committing to Tobacco Cessation, relapse prevention, and  pharmacological support that can be provided by physician.;Advice worker, assist with locating and accessing local/national Quit Smoking programs, and support quit date choice.    Expected Outcomes  Short Term: Will demonstrate readiness to quit, by selecting a quit date.;Short Term: Will quit all tobacco product use, adhering to prevention of relapse plan.;Long Term: Complete abstinence from all tobacco products for at least 12 months from quit date.    Improve shortness of breath with ADL's  Yes    Intervention  Provide education, individualized exercise plan and daily activity instruction to help decrease symptoms of SOB with activities of daily living.    Expected Outcomes  Short Term: Improve cardiorespiratory fitness to achieve a reduction of symptoms when performing ADLs;Long Term: Be able to perform more ADLs without symptoms or delay the onset of symptoms    Lipids  Yes    Intervention  Provide education and support for participant on nutrition & aerobic/resistive exercise along with prescribed medications to achieve LDL 70mg , HDL >40mg .   she reports higher lipids.   Expected Outcomes  Short Term: Participant states understanding of desired cholesterol values and is compliant with medications prescribed. Participant is following exercise prescription and nutrition guidelines.;Long Term: Cholesterol controlled with medications as prescribed, with individualized exercise RX and with personalized nutrition plan. Value goals: LDL < 70mg , HDL > 40 mg.       Tobacco Use Initial Evaluation: Social History   Tobacco Use  Smoking Status Current Some Day Smoker  . Packs/day: 1.00  . Years: 47.00  . Pack years: 47.00  . Types: Cigarettes  Smokeless Tobacco Never Used  Tobacco Comment   Trying to quit smoking    Exercise Goals and Review: Exercise Goals    Row Name 03/09/18 1532             Exercise Goals   Increase Physical Activity  Yes       Intervention  Provide  advice, education, support and counseling about physical activity/exercise needs.;Develop an individualized exercise prescription for aerobic and resistive training based on initial evaluation findings, risk stratification, comorbidities and participant's personal goals.       Expected Outcomes  Short Term: Attend rehab on a regular basis to increase amount of physical activity.;Long Term: Add in home exercise to make exercise part of routine and to increase amount of physical activity.;Long Term: Exercising regularly at least 3-5 days a week.       Increase Strength and Stamina  Yes       Intervention  Provide advice, education, support and counseling about physical activity/exercise needs.;Develop an individualized exercise prescription for aerobic and resistive training based on initial evaluation findings, risk stratification, comorbidities and participant's personal goals.  Expected Outcomes  Short Term: Increase workloads from initial exercise prescription for resistance, speed, and METs.;Long Term: Improve cardiorespiratory fitness, muscular endurance and strength as measured by increased METs and functional capacity (6MWT);Short Term: Perform resistance training exercises routinely during rehab and add in resistance training at home       Able to understand and use rate of perceived exertion (RPE) scale  Yes       Intervention  Provide education and explanation on how to use RPE scale       Expected Outcomes  Short Term: Able to use RPE daily in rehab to express subjective intensity level;Long Term:  Able to use RPE to guide intensity level when exercising independently       Able to understand and use Dyspnea scale  Yes       Intervention  Provide education and explanation on how to use Dyspnea scale       Expected Outcomes  Short Term: Able to use Dyspnea scale daily in rehab to express subjective sense of shortness of breath during exertion;Long Term: Able to use Dyspnea scale to guide  intensity level when exercising independently       Knowledge and understanding of Target Heart Rate Range (THRR)  Yes       Intervention  Provide education and explanation of THRR including how the numbers were predicted and where they are located for reference       Expected Outcomes  Short Term: Able to state/look up THRR;Short Term: Able to use daily as guideline for intensity in rehab;Long Term: Able to use THRR to govern intensity when exercising independently       Able to check pulse independently  Yes       Intervention  Provide education and demonstration on how to check pulse in carotid and radial arteries.;Review the importance of being able to check your own pulse for safety during independent exercise       Expected Outcomes  Short Term: Able to explain why pulse checking is important during independent exercise;Long Term: Able to check pulse independently and accurately       Understanding of Exercise Prescription  Yes       Intervention  Provide education, explanation, and written materials on patient's individual exercise prescription       Expected Outcomes  Short Term: Able to explain program exercise prescription;Long Term: Able to explain home exercise prescription to exercise independently          Copy of goals given to participant.

## 2018-03-09 NOTE — Progress Notes (Signed)
Daily Session Note  Patient Details  Name: Christine Serrano MRN: [id number] Date of Birth: 09-Nov-1960 Referring Provider:     Pulmonary Rehab from 03/09/2018 in [facility]  Referring Provider  Merton Border MD      Encounter Date: 03/09/2018  Check In: Session Check In - 03/09/18 [place]      Check-In   Supervising physician immediately available to respond to emergencies  [facility] immediately available ER MD    Physician(s)  Dr. Jimmye Norman and Corky Downs    Location  [facility]-Cardiac & Pulmonary Rehab    Staff Present  Justin Mend Lorre Nick, [place], RCEP, CCRP, Exercise Physiologist    Medication changes reported      No    Fall or balance concerns reported     No    Tobacco Cessation  No Change   ready to quit, 1 pack a day   Warm-up and Cool-down  Not performed (comment)    Resistance Training Performed  No    VAD Patient?  No      Pain Assessment   Currently in Pain?  No/denies        Exercise Prescription Changes - 03/09/18 1500      Response to Exercise   Blood Pressure (Admit)  126/64    Blood Pressure (Exercise)  134/84    Blood Pressure (Exit)  126/66    Heart Rate (Admit)  110 bpm    Heart Rate (Exercise)  126 bpm    Heart Rate (Exit)  116 bpm    Oxygen Saturation (Admit)  94 %    Oxygen Saturation (Exercise)  76 %    Oxygen Saturation (Exit)  93 %    Rating of Perceived Exertion (Exercise)  18    Perceived Dyspnea (Exercise)  4    Symptoms  SOB, weakness    Comments  walk test results       Social History   Tobacco Use  Smoking Status Current Some Day Smoker  . Packs/day: 1.00  . Years: 47.00  . Pack years: 47.00  . Types: Cigarettes  Smokeless Tobacco Never Used  Tobacco Comment   Trying to quit smoking    Goals Met:  Exercise tolerated well Personal goals reviewed Queuing for purse lip breathing No report of cardiac concerns or symptoms Strength training completed today  Goals Unmet:  Not  Applicable  Comments:  6 Minute Walk    Row Name 03/09/18 1523         6 Minute Walk   Phase  Initial     Distance  365 feet     Walk Time  2 minutes     # of Rest Breaks  0     MPH  2.07     METS  2.59     RPE  18     Perceived Dyspnea   4     VO2 Peak  9.05     Symptoms  Yes (comment)     Comments  SOB, weakness     Resting HR  110 bpm     Resting BP  126/64     Resting Oxygen Saturation   94 %     Exercise Oxygen Saturation  during 6 min walk  76 %     Max Ex. HR  126 bpm     Max Ex. BP  134/84     2 Minute Post BP  126/66       Interval HR  1 Minute HR  123     2 Minute HR  126     2 Minute Post HR  116     Interval Heart Rate?  Yes       Interval Oxygen   Interval Oxygen?  Yes     Baseline Oxygen Saturation %  94 %     1 Minute Oxygen Saturation %  85 %     1 Minute Liters of Oxygen  2 L pulsed     2 Minute Oxygen Saturation %  76 % test terminated at 2 min     2 Minute Liters of Oxygen  2 L     2 Minute Post Oxygen Saturation %  93 %     2 Minute Post Liters of Oxygen  2 L      Service Time 1430-1540   Dr. Emily Filbert is Medical Director for [place] and [facility].

## 2018-03-11 ENCOUNTER — Other Ambulatory Visit: Payer: Self-pay | Admitting: Pulmonary Disease

## 2018-03-12 ENCOUNTER — Ambulatory Visit: Payer: Medicare HMO | Admitting: Psychology

## 2018-03-12 ENCOUNTER — Other Ambulatory Visit: Payer: Self-pay

## 2018-03-12 MED ORDER — HYDROXYZINE HCL 25 MG PO TABS: 12.5000 mg | ORAL_TABLET | Freq: Two times a day (BID) | ORAL | 0 refills | Status: DC | PRN

## 2018-03-12 NOTE — Telephone Encounter (Signed)
Escribed

## 2018-03-15 NOTE — Progress Notes (Signed)
I reviewed health advisor's note, was available for consultation, and agree with documentation and plan.  

## 2018-03-16 ENCOUNTER — Ambulatory Visit: Payer: Self-pay

## 2018-03-16 ENCOUNTER — Ambulatory Visit (INDEPENDENT_AMBULATORY_CARE_PROVIDER_SITE_OTHER): Payer: Medicare HMO | Admitting: Psychology

## 2018-03-16 DIAGNOSIS — F331 Major depressive disorder, recurrent, moderate: Secondary | ICD-10-CM

## 2018-03-17 ENCOUNTER — Ambulatory Visit (INDEPENDENT_AMBULATORY_CARE_PROVIDER_SITE_OTHER): Payer: Medicare HMO | Admitting: Family Medicine

## 2018-03-17 ENCOUNTER — Encounter: Payer: Self-pay | Admitting: Family Medicine

## 2018-03-17 VITALS — BP 112/64 | HR 88 | Temp 98.7°F | Ht 64.5 in | Wt 113.2 lb

## 2018-03-17 DIAGNOSIS — Z1239 Encounter for other screening for malignant neoplasm of breast: Secondary | ICD-10-CM

## 2018-03-17 DIAGNOSIS — Z23 Encounter for immunization: Secondary | ICD-10-CM | POA: Diagnosis not present

## 2018-03-17 DIAGNOSIS — E785 Hyperlipidemia, unspecified: Secondary | ICD-10-CM

## 2018-03-17 DIAGNOSIS — Z0000 Encounter for general adult medical examination without abnormal findings: Secondary | ICD-10-CM

## 2018-03-17 DIAGNOSIS — I700 Atherosclerosis of aorta: Secondary | ICD-10-CM | POA: Diagnosis not present

## 2018-03-17 DIAGNOSIS — B354 Tinea corporis: Secondary | ICD-10-CM | POA: Diagnosis not present

## 2018-03-17 DIAGNOSIS — F5104 Psychophysiologic insomnia: Secondary | ICD-10-CM

## 2018-03-17 DIAGNOSIS — F1011 Alcohol abuse, in remission: Secondary | ICD-10-CM

## 2018-03-17 DIAGNOSIS — J439 Emphysema, unspecified: Secondary | ICD-10-CM | POA: Diagnosis not present

## 2018-03-17 DIAGNOSIS — Z9189 Other specified personal risk factors, not elsewhere classified: Secondary | ICD-10-CM

## 2018-03-17 DIAGNOSIS — F331 Major depressive disorder, recurrent, moderate: Secondary | ICD-10-CM | POA: Diagnosis not present

## 2018-03-17 DIAGNOSIS — Z720 Tobacco use: Secondary | ICD-10-CM | POA: Diagnosis not present

## 2018-03-17 MED ORDER — TRAZODONE HCL 50 MG PO TABS
25.0000 mg | ORAL_TABLET | Freq: Every day | ORAL | 1 refills | Status: DC
Start: 2018-03-17 — End: 2018-08-13

## 2018-03-17 MED ORDER — HYDROXYZINE HCL 25 MG PO TABS: 25.0000 mg | ORAL_TABLET | Freq: Two times a day (BID) | ORAL | 0 refills | Status: DC | PRN

## 2018-03-17 NOTE — Telephone Encounter (Signed)
Will discuss today at [facility]

## 2018-03-17 NOTE — Assessment & Plan Note (Signed)
Preventative protocols reviewed and updated unless pt declined. Discussed healthy diet and lifestyle.  

## 2018-03-17 NOTE — Progress Notes (Signed)
BP 112/64 (BP Location: Left Arm, Patient Position: Sitting, Cuff Size: Normal)   Pulse 88   Temp 98.7 F (37.1 C) (Oral)   Ht 5' 4.5" (1.638 m)   Wt 113 lb 4 oz (51.4 kg)   SpO2 96% Comment: 2 L, pulsating  BMI 19.14 kg/m    CC: CPE Subjective:    Patient ID: Christine Serrano, female    DOB: [birth year], 59 y.o.   MRN: [id number]  HPI: Christine Serrano is a 57 y.o. female presenting on 03/17/2018 for Annual Exam (Pt 2.)   Saw Katha Cabal 01/2018 for medicare wellness visit. Note reviewed.  Requests Rx for scooter for severe COPD. States [facility].   Rash to L elbow - using clotrimazole for the past week.   Worsening depression - disappointed she continues smoking. Continues sertraline 154m daily. She has been taking whole tablet hydroxyzine - requests refill to reflect this. She continues seeing counselor [place]   Preventative:  COLONOSCOPY WITH PROPOFOL 08/04/2017 - TAx3, angiodysplastic lesion, diverticulosis, rpt 5 yrs ([facility]) Mammogram 04/2017 - Birads1  Well woman exam - h/o cervical abnormalities s/p conization 1978, then laser surgery 1987. Pap normal 02/2017 - consider Q3 yrs.  DEXA - never done in the past. Will order this year.  Flu shot yearly  Pneumovax 2010. Endorses h/o multiple pneumonias in the past including recently Tetanus - declines for now Advanced directive discussion:  Seat belt use discussed Sunscreen use discussed. No changing moles on skin.  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 Interim medical history since our last visit  reviewed. Allergies and medications reviewed and updated. Outpatient Medications Prior to Visit  Medication Sig Dispense Refill  . aspirin EC 81 MG tablet Take 81 mg by mouth daily.    . benzonatate (TESSALON) 100 MG capsule Take 1 capsule (100 mg total) by mouth 2 (two) times daily as needed for cough. 30 capsule 3  . Cholecalciferol (VITAMIN D) 2000 units tablet Take 2,000 Units by mouth daily.    . clotrimazole (LOTRIMIN) 1 % cream Apply 1 application topically 2 (two) times daily. For 2 weeks to rash 60 g 0  . fluticasone (FLONASE) 50 MCG/ACT nasal spray Place 2 sprays into both nostrils daily. 16 g 3  . gabapentin (NEURONTIN) 100 MG capsule Take one tablet nightly for 4 days then twice daily for 4 days then three times daily (Patient taking differently: Take 100 mg by mouth 3 (three) times daily. ) 90 capsule 3  . ipratropium-albuterol (DUONEB) 0.5-2.5 (3) MG/3ML SOLN Inhale 3 mLs into the lungs every 6 (six) hours as needed. 360 mL 3  . LINZESS 290 MCG CAPS capsule Take 1 capsule (290 mcg total) by mouth daily. 90 capsule 3  . Multiple Vitamins-Minerals (MULTIVITAMIN ADULT) TABS Take 1 tablet by mouth daily.    . Na Sulfate-K Sulfate-Mg Sulf (SUPREP BOWEL PREP KIT) 17.5-3.13-1.6 GM/177ML SOLN Take 1 kit by mouth as directed. 324 mL 0  . naproxen (NAPROSYN) 375 MG tablet Take 1 tablet (375 mg total) by mouth 2 (two) times daily as needed for moderate  pain. 40 tablet 1  . OXYGEN Inhale 3 L/min into the lungs continuous.    . sertraline (ZOLOFT) 100 MG tablet Take 1.5 tablets (150 mg total) by mouth daily. 135 tablet 3  . SYMBICORT 160-4.5 MCG/ACT inhaler Inhale 2 puffs into the lungs 2 (two) times daily. 3 Inhaler 3  . tiotropium (SPIRIVA) 18 MCG inhalation capsule Place 1 capsule (18 mcg total) into inhaler and inhale daily. 90 capsule 4  . VENTOLIN HFA 108 (90 Base) MCG/ACT inhaler INHALE 2 PUFFS EVERY 6 (SIX) HOURS AS NEEDED FOR SHORTNESS OF BREATH. (NEED APPOINTMENT) 72 g 1  . vitamin  B-12 (CYANOCOBALAMIN) 1000 MCG tablet Take 1,000 mcg by mouth daily.    . vitamin E 400 UNIT capsule Take 400 Units by mouth daily.     . hydrOXYzine (ATARAX/VISTARIL) 25 MG tablet Take 0.5 tablets (12.5 mg total) by mouth 2 (two) times daily as needed for anxiety. 90 tablet 0  . traZODone (DESYREL) 50 MG tablet Take 0.5 tablets (25 mg total) by mouth at bedtime as needed for sleep. 45 tablet 3   No facility-administered medications prior to visit.      Per HPI unless specifically indicated in ROS section below Review of Systems  Constitutional: Negative for activity change, appetite change, chills, fatigue, fever and unexpected weight change.  HENT: Negative for hearing loss.   Eyes: Negative for visual disturbance.  Respiratory: Positive for cough, shortness of breath and wheezing. Negative for chest tightness.   Cardiovascular: Negative for chest pain, palpitations and leg swelling.  Gastrointestinal: Positive for constipation and diarrhea (due to linzess). Negative for abdominal distention, abdominal pain, blood in stool, nausea and vomiting.  Genitourinary: Negative for difficulty urinating and hematuria.  Musculoskeletal: Negative for arthralgias, myalgias and neck pain.  Skin: Negative for rash.  Neurological: Positive for light-headedness (from coughing). Negative for dizziness, seizures, syncope and headaches.  Hematological: Negative for adenopathy. Does not bruise/bleed easily.  Psychiatric/Behavioral: Positive for dysphoric mood (worsening). The patient is not nervous/anxious.        Objective:    BP 112/64 (BP Location: Left Arm, Patient Position: Sitting, Cuff Size: Normal)   Pulse 88   Temp 98.7 F (37.1 C) (Oral)   Ht 5' 4.5" (1.638 m)   Wt 113 lb 4 oz (51.4 kg)   SpO2 96% Comment: 2 L, pulsating  BMI 19.14 kg/m   Wt Readings from Last 3 Encounters:  03/17/18 113 lb 4 oz (51.4 kg)  03/09/18 111 lb 11.2 oz (50.7 kg)  02/06/18 113 lb 8 oz (51.5 kg)    Physical  Exam  Constitutional: She is oriented to person, place, and time. She appears well-developed and well-nourished. No distress.  [place] in place  HENT:  Head: Normocephalic and atraumatic.  Right Ear: Hearing, tympanic membrane, external ear and ear canal normal.  Left Ear: Hearing, tympanic membrane, external ear and ear canal normal.  Nose: Nose normal.  Mouth/Throat: Uvula is midline, oropharynx is clear and moist and mucous membranes are normal. No oropharyngeal exudate, posterior oropharyngeal edema or posterior oropharyngeal erythema.  Eyes: Pupils are equal, round, and reactive to light. Conjunctivae and EOM are normal. No scleral icterus.  Neck: Normal range of motion. Neck supple. Carotid bruit is not present. No thyromegaly present.  Cardiovascular: Normal rate, regular rhythm, normal heart sounds and intact distal pulses.  No murmur heard. Pulses:      Radial pulses are 2+ on the right side, and 2+ on the left side.  Pulmonary/Chest: Effort normal  and breath sounds normal. No respiratory distress. She has no wheezes. She has no rales.  Abdominal: Soft. Bowel sounds are normal. She exhibits no distension and no mass. There is no tenderness. There is no rebound and no guarding.  Musculoskeletal: Normal range of motion. She exhibits no edema.  Lymphadenopathy:    She has no cervical adenopathy.  Neurological: She is alert and oriented to person, place, and time.  CN grossly intact, station and gait intact  Skin: Skin is warm and dry. Rash noted.  Circular scaly rash L posterior elbow with central clearing  Psychiatric: She has a normal mood and affect. Her behavior is normal. Judgment and thought content normal.  Nursing note and vitals reviewed.  Results for orders placed or performed in visit on 02/06/18  CBC with Differential/Platelet  Result Value Ref Range   WBC 6.3 4.0 - 10.5 K/uL   RBC 4.60 3.87 - 5.11 Mil/uL   Hemoglobin 13.2 12.0 - 15.0 g/dL   HCT 39.3 36.0 - 46.0 %   MCV  85.5 78.0 - 100.0 fl   MCHC 33.5 30.0 - 36.0 g/dL   RDW 15.0 11.5 - 15.5 %   Platelets 204.0 150.0 - 400.0 K/uL   Neutrophils Relative % 53.1 43.0 - 77.0 %   Lymphocytes Relative 36.5 12.0 - 46.0 %   Monocytes Relative 6.8 3.0 - 12.0 %   Eosinophils Relative 3.2 0.0 - 5.0 %   Basophils Relative 0.4 0.0 - 3.0 %   Neutro Abs 3.4 1.4 - 7.7 K/uL   Lymphs Abs 2.3 0.7 - 4.0 K/uL   Monocytes Absolute 0.4 0.1 - 1.0 K/uL   Eosinophils Absolute 0.2 0.0 - 0.7 K/uL   Basophils Absolute 0.0 0.0 - 0.1 K/uL  TSH  Result Value Ref Range   TSH 1.20 0.35 - 4.50 uIU/mL  Comprehensive metabolic panel  Result Value Ref Range   Sodium 139 135 - 145 mEq/L   Potassium 4.2 3.5 - 5.1 mEq/L   Chloride 99 96 - 112 mEq/L   CO2 35 (H) 19 - 32 mEq/L   Glucose, Bld 89 70 - 99 mg/dL   BUN 9 6 - 23 mg/dL   Creatinine, Ser 0.66 0.40 - 1.20 mg/dL   Total Bilirubin 0.4 0.2 - 1.2 mg/dL   Alkaline Phosphatase 72 39 - 117 U/L   AST 15 0 - 37 U/L   ALT 7 0 - 35 U/L   Total Protein 6.7 6.0 - 8.3 g/dL   Albumin 4.1 3.5 - 5.2 g/dL   Calcium 9.9 8.4 - 10.5 mg/dL   GFR 97.40 >60.00 mL/min  Lipid panel  Result Value Ref Range   Cholesterol 181 0 - 200 mg/dL   Triglycerides 153.0 (H) 0.0 - 149.0 mg/dL   HDL 45.30 >39.00 mg/dL   VLDL 30.6 0.0 - 40.0 mg/dL   LDL Cholesterol 105 (H) 0 - 99 mg/dL   Total CHOL/HDL Ratio 4    NonHDL 135.96    Depression screen [facility] 2/9 03/09/2018 02/06/2018 08/18/2017 07/07/2017 02/05/2017  Decreased Interest _0 Down, Depressed, Hopeless 3 3 0 2 1  PHQ - 2 Score _1 Altered sleeping _2 Tired, decreased energy _3 Change in appetite 2 1 0 2 0  Feeling bad or failure about yourself  1 1 0 0 0  Trouble concentrating 1 0 1 1 0  Moving slowly or fidgety/restless 1 0  0 1 0  Suicidal thoughts 0 0 0 0 0  PHQ-9 Score _0 Difficult doing work/chores Extremely dIfficult Very difficult - - Somewhat difficult       Assessment & Plan:   Problem List Items  Addressed This Visit    Tobacco abuse    Continue to encourage cessation - contemplative.       Tinea corporis    Anticipate cause of L elbow rash - rec continue clotrimazole treatment. Prior rash to R chest returned + KOH for tinea corporis.       Moderate recurrent major depression (HCC)    Continue sertraline 139m daily - will increase trazodone to 571mdaily, hopeful for improvement in mood and sleep.  PHQ 10-->16.      Relevant Medications   hydrOXYzine (ATARAX/VISTARIL) 25 MG tablet   traZODone (DESYREL) 50 MG tablet   HLD (hyperlipidemia)    Chronic, mild hyperlipidemia off statin. Continue to encourage smoking cessation. The 10-year ASCVD risk score (GMikey BussingC JrBrooke Bonito et al., 2013) is: 5.3%   Values used to calculate the score:     Age: [age]     Sex: Female     Is Non-Hispanic African American: No     Diabetic: No     Tobacco smoker: Yes     Systolic Blood Pressure: 11492mHg     Is BP treated: No     HDL Cholesterol: 45.3 mg/dL     Total Cholesterol: 181 mg/dL       Health maintenance examination - Primary    Preventative protocols reviewed and updated unless pt declined. Discussed healthy diet and lifestyle.       COPD (chronic obstructive pulmonary disease) (HCC)    Chronic, severe with chronic exertional dyspnea.  Touched base with pulm about power scooter.  Considering lung transplant.  Will write Rx for power scooter and [facility] - pt aware may need to return for mobility evaluation.      Chronic insomnia    Increase trazodone to 5074maily.       Aortic atherosclerosis (HCC)    Continue aspirin. Consider statin. Encourage full smoking cessation.       Alcohol abuse, in remission    H/o this. States she sips margarita throughout the day (8% alcohol). Encouraged she change this to water or juice.        Other Visit Diagnoses    Need for influenza vaccination       Relevant Orders   Flu Vaccine  QUAD 36+ mos IM (Completed)   At high risk for osteoporosis       Relevant Orders   DG Bone Density   Breast cancer screening       Relevant Orders   MM Digital Screening       Meds ordered this encounter  Medications  . hydrOXYzine (ATARAX/VISTARIL) 25 MG tablet    Sig: Take 1 tablet (25 mg total) by mouth 2 (two) times daily as needed for anxiety.    Dispense:  180 tablet    Refill:  0  . traZODone (DESYREL) 50 MG tablet    Sig: Take 0.5 tablets (25 mg total) by mouth at bedtime.    Dispense:  90 tablet    Refill:  1   Orders Placed This Encounter  Procedures  . DG Bone Density    Standing Status:   Future    Standing Expiration Date:  05/19/2019    Order Specific Question:   Reason for Exam (SYMPTOM  OR DIAGNOSIS REQUIRED)    Answer:   osteoporosis screen    Order Specific Question:   Is the patient pregnant?    Answer:   No    Order Specific Question:   Preferred imaging location?    Answer:   [facility]  . MM Digital Screening    Standing Status:   Future    Standing Expiration Date:   05/19/2019    Order Specific Question:   Reason for Exam (SYMPTOM  OR DIAGNOSIS REQUIRED)    Answer:   breast cancer screen    Order Specific Question:   Is the patient pregnant?    Answer:   No    Order Specific Question:   Preferred imaging location?    Answer:   [facility]  . Flu Vaccine QUAD 36+ mos IM    Follow up plan: No follow-ups on file.  Ria Bush, MD

## 2018-03-17 NOTE — Patient Instructions (Addendum)
Flu shot today Pap smear every 3 years.  We will order bone density scan to see if it can at the same time as mammogram.  Schedule eye exam at your convenience.  Labs were looking good! Return as needed for mobility evaluation. Increase trazodone to '50mg'$  at bedtime.  Return in 4 moths for follow up visit.  Health Maintenance, Female Adopting a healthy lifestyle and getting preventive care can go a long way to promote health and wellness. Talk with your health care provider about what schedule of regular examinations is right for you. This is a good chance for you to check in with your provider about disease prevention and staying healthy. In between checkups, there are plenty of things you can do on your own. Experts have done a lot of research about which lifestyle changes and preventive measures are most likely to keep you healthy. Ask your health care provider for more information. Weight and diet Eat a healthy diet  Be sure to include plenty of vegetables, fruits, low-fat dairy products, and lean protein.  Do not eat a lot of foods high in solid fats, added sugars, or salt.  Get regular exercise. This is one of the most important things you can do for your health. ? Most adults should exercise for at least 150 minutes each week. The exercise should increase your heart rate and make you sweat (moderate-intensity exercise). ? Most adults should also do strengthening exercises at least twice a week. This is in addition to the moderate-intensity exercise.  Maintain a healthy weight  Body mass index (BMI) is a measurement that can be used to identify possible weight problems. It estimates body fat based on height and weight. Your health care provider can help determine your BMI and help you achieve or maintain a healthy weight.  For females 64 years of age and older: ? A BMI below 18.5 is considered underweight. ? A BMI of 18.5 to 24.9 is normal. ? A BMI of 25 to 29.9 is considered  overweight. ? A BMI of 30 and above is considered obese.  Watch levels of cholesterol and blood lipids  You should start having your blood tested for lipids and cholesterol at 59 years of age, then have this test every 5 years.  You may need to have your cholesterol levels checked more often if: ? Your lipid or cholesterol levels are high. ? You are older than 59 years of age. ? You are at high risk for heart disease.  Cancer screening Lung Cancer  Lung cancer screening is recommended for adults 71-66 years old who are at high risk for lung cancer because of a history of smoking.  A yearly low-dose CT scan of the lungs is recommended for people who: ? Currently smoke. ? Have quit within the past 15 years. ? Have at least a 30-pack-year history of smoking. A pack year is smoking an average of one pack of cigarettes a day for 1 year.  Yearly screening should continue until it has been 15 years since you quit.  Yearly screening should stop if you develop a health problem that would prevent you from having lung cancer treatment.  Breast Cancer  Practice breast self-awareness. This means understanding how your breasts normally appear and feel.  It also means doing regular breast self-exams. Let your health care provider know about any changes, no matter how small.  If you are in your 20s or 30s, you should have a clinical breast exam (CBE) by a  health care provider every 1-3 years as part of a regular health exam.  If you are 13 or older, have a CBE every year. Also consider having a breast X-ray (mammogram) every year.  If you have a family history of breast cancer, talk to your health care provider about genetic screening.  If you are at high risk for breast cancer, talk to your health care provider about having an MRI and a mammogram every year.  Breast cancer gene (BRCA) assessment is recommended for women who have family members with BRCA-related cancers. BRCA-related cancers  include: ? Breast. ? Ovarian. ? Tubal. ? Peritoneal cancers.  Results of the assessment will determine the need for genetic counseling and BRCA1 and BRCA2 testing.  Cervical Cancer Your health care provider may recommend that you be screened regularly for cancer of the pelvic organs (ovaries, uterus, and vagina). This screening involves a pelvic examination, including checking for microscopic changes to the surface of your cervix (Pap test). You may be encouraged to have this screening done every 3 years, beginning at age 39.  For women ages 21-65, health care providers may recommend pelvic exams and Pap testing every 3 years, or they may recommend the Pap and pelvic exam, combined with testing for human papilloma virus (HPV), every 5 years. Some types of HPV increase your risk of cervical cancer. Testing for HPV may also be done on women of any age with unclear Pap test results.  Other health care providers may not recommend any screening for nonpregnant women who are considered low risk for pelvic cancer and who do not have symptoms. Ask your health care provider if a screening pelvic exam is right for you.  If you have had past treatment for cervical cancer or a condition that could lead to cancer, you need Pap tests and screening for cancer for at least 20 years after your treatment. If Pap tests have been discontinued, your risk factors (such as having a new sexual partner) need to be reassessed to determine if screening should resume. Some women have medical problems that increase the chance of getting cervical cancer. In these cases, your health care provider may recommend more frequent screening and Pap tests.  Colorectal Cancer  This type of cancer can be detected and often prevented.  Routine colorectal cancer screening usually begins at 59 years of age and continues through 59 years of age.  Your health care provider may recommend screening at an earlier age if you have risk factors  for colon cancer.  Your health care provider may also recommend using home test kits to check for hidden blood in the stool.  A small camera at the end of a tube can be used to examine your colon directly (sigmoidoscopy or colonoscopy). This is done to check for the earliest forms of colorectal cancer.  Routine screening usually begins at age 58.  Direct examination of the colon should be repeated every 5-10 years through 59 years of age. However, you may need to be screened more often if early forms of precancerous polyps or small growths are found.  Skin Cancer  Check your skin from head to toe regularly.  Tell your health care provider about any new moles or changes in moles, especially if there is a change in a mole's shape or color.  Also tell your health care provider if you have a mole that is larger than the size of a pencil eraser.  Always use sunscreen. Apply sunscreen liberally and repeatedly throughout  Protect yourself by wearing long sleeves, pants, a wide-brimmed hat, and sunglasses whenever you are outside.  Heart disease, diabetes, and high blood pressure  High blood pressure causes heart disease and increases the risk of stroke. High blood pressure is more likely to develop in: ? People who have blood pressure in the high end of the normal range (130-139/85-89 mm Hg). ? People who are overweight or obese. ? People who are African American.  If you are 18-39 years of age, have your blood pressure checked every 3-5 years. If you are 40 years of age or older, have your blood pressure checked every year. You should have your blood pressure measured twice-once when you are at a hospital or clinic, and once when you are not at a hospital or clinic. Record the average of the two measurements. To check your blood pressure when you are not at a hospital or clinic, you can use: ? An automated blood pressure machine at a pharmacy. ? A home blood pressure monitor.  If  you are between 55 years and 79 years old, ask your health care provider if you should take aspirin to prevent strokes.  Have regular diabetes screenings. This involves taking a blood sample to check your fasting blood sugar level. ? If you are at a normal weight and have a low risk for diabetes, have this test once every three years after 59 years of age. ? If you are overweight and have a high risk for diabetes, consider being tested at a younger age or more often. Preventing infection Hepatitis B  If you have a higher risk for hepatitis B, you should be screened for this virus. You are considered at high risk for hepatitis B if: ? You were born in a country where hepatitis B is common. Ask your health care provider which countries are considered high risk. ? Your parents were born in a high-risk country, and you have not been immunized against hepatitis B (hepatitis B vaccine). ? You have HIV or AIDS. ? You use needles to inject street drugs. ? You live with someone who has hepatitis B. ? You have had sex with someone who has hepatitis B. ? You get hemodialysis treatment. ? You take certain medicines for conditions, including cancer, organ transplantation, and autoimmune conditions.  Hepatitis C  Blood testing is recommended for: ? Everyone born from 1945 through 1965. ? Anyone with known risk factors for hepatitis C.  Sexually transmitted infections (STIs)  You should be screened for sexually transmitted infections (STIs) including gonorrhea and chlamydia if: ? You are sexually active and are younger than 59 years of age. ? You are older than 59 years of age and your health care provider tells you that you are at risk for this type of infection. ? Your sexual activity has changed since you were last screened and you are at an increased risk for chlamydia or gonorrhea. Ask your health care provider if you are at risk.  If you do not have HIV, but are at risk, it may be recommended  that you take a prescription medicine daily to prevent HIV infection. This is called pre-exposure prophylaxis (PrEP). You are considered at risk if: ? You are sexually active and do not regularly use condoms or know the HIV status of your partner(s). ? You take drugs by injection. ? You are sexually active with a partner who has HIV.  Talk with your health care provider about whether you are at high risk of   high risk of being infected with HIV. If you choose to begin PrEP, you should first be tested for HIV. You should then be tested every 3 months for as long as you are taking PrEP. Pregnancy  If you are premenopausal and you may become pregnant, ask your health care provider about preconception counseling.  If you may become pregnant, take 400 to 800 micrograms (mcg) of folic acid every day.  If you want to prevent pregnancy, talk to your health care provider about birth control (contraception). Osteoporosis and menopause  Osteoporosis is a disease in which the bones lose minerals and strength with aging. This can result in serious bone fractures. Your risk for osteoporosis can be identified using a bone density scan.  If you are 14 years of age or older, or if you are at risk for osteoporosis and fractures, ask your health care provider if you should be screened.  Ask your health care provider whether you should take a calcium or vitamin D supplement to lower your risk for osteoporosis.  Menopause may have certain physical symptoms and risks.  Hormone replacement therapy may reduce some of these symptoms and risks. Talk to your health care provider about whether hormone replacement therapy is right for you. Follow these instructions at home:  Schedule regular health, dental, and eye exams.  Stay current with your immunizations.  Do not use any tobacco products including cigarettes, chewing tobacco, or electronic cigarettes.  If you are pregnant, do not drink alcohol.  If you are  breastfeeding, limit how much and how often you drink alcohol.  Limit alcohol intake to no more than 1 drink per day for nonpregnant women. One drink equals 12 ounces of beer, 5 ounces of wine, or 1 ounces of hard liquor.  Do not use street drugs.  Do not share needles.  Ask your health care provider for help if you need support or information about quitting drugs.  Tell your health care provider if you often feel depressed.  Tell your health care provider if you have ever been abused or do not feel safe at home. This information is not intended to replace advice given to you by your health care provider. Make sure you discuss any questions you have with your health care provider. Document Released: 12/17/2010 Document Revised: 11/09/2015 Document Reviewed: 03/07/2015 Elsevier Interactive Patient Education  [employer].

## 2018-03-18 ENCOUNTER — Ambulatory Visit: Payer: Self-pay

## 2018-03-18 NOTE — Assessment & Plan Note (Signed)
Chronic, mild hyperlipidemia off statin. Continue to encourage smoking cessation. The 10-year ASCVD risk score Mikey Bussing DC Brooke Bonito., et al., 2013) is: 5.3%   Values used to calculate the score:     Age: 57 years     Sex: Female     Is Non-Hispanic African American: No     Diabetic: No     Tobacco smoker: Yes     Systolic Blood Pressure: 864 mmHg     Is BP treated: No     HDL Cholesterol: 45.3 mg/dL     Total Cholesterol: 181 mg/dL

## 2018-03-18 NOTE — Assessment & Plan Note (Signed)
Anticipate cause of L elbow rash - rec continue clotrimazole treatment. Prior rash to R chest returned + KOH for tinea corporis.

## 2018-03-18 NOTE — Assessment & Plan Note (Signed)
Continue to encourage cessation. contemplative 

## 2018-03-18 NOTE — Assessment & Plan Note (Addendum)
Chronic, severe with chronic exertional dyspnea.  T[place] base with pulm about power scooter.  Considering lung transplant.  Will write Rx for power scooter [facility] - pt aware may need to return for mobility evaluation.

## 2018-03-18 NOTE — Assessment & Plan Note (Addendum)
Increase trazodone to 50mg  daily.

## 2018-03-18 NOTE — Assessment & Plan Note (Signed)
Continue sertraline 150mg  daily - will increase trazodone to 50mg  daily, hopeful for improvement in mood and sleep.  PHQ 10-->16.

## 2018-03-18 NOTE — Assessment & Plan Note (Addendum)
Continue aspirin. Consider statin. Encourage full smoking cessation.

## 2018-03-18 NOTE — Assessment & Plan Note (Signed)
H/o this. States she sips margarita throughout the day (8% alcohol). Encouraged she change this to water or juice.

## 2018-03-18 NOTE — Telephone Encounter (Addendum)
Faxed rx to [facility] at [phone].

## 2018-03-18 NOTE — Telephone Encounter (Signed)
Rx written and in Lisa's box. - [facility]

## 2018-03-19 ENCOUNTER — Other Ambulatory Visit: Payer: Self-pay | Admitting: Family Medicine

## 2018-03-19 NOTE — Telephone Encounter (Signed)
Requested medication (s) are due for refill today: Yes  Requested medication (s) are on the active medication list: Yes  Last refill:  03/12/18  Future visit scheduled:No  Notes to clinic:  Prescribed by another provider. Patient requesting an emergency refill today until her mail order arrives.     Requested Prescriptions  Pending Prescriptions Disp Refills   VENTOLIN HFA 108 (90 Base) MCG/ACT inhaler 72 g 1     Pulmonology:  Beta Agonists Failed - 03/19/2018  4:51 PM      Failed - One inhaler should last at least one month. If the patient is requesting refills earlier, contact the patient to check for uncontrolled symptoms.      Passed - Valid encounter within last 12 months    Recent Outpatient Visits          2 days ago Health maintenance examination   [place] [facility] [place], [place]   1 month ago Tinea corporis   Therapist, music at [place], [place]   5 months ago COPD with acute exacerbation [facility])   [place] [facility] [place], [place]   7 months ago Moderate recurrent major depression [facility])   [place] [facility] [place], [place]   8 months ago Moderate recurrent major depression [facility])   [facility] at [facility] Ria Bush, MD      Future Appointments            In 10 months [place], [place], [place] at [place], [place]   In 11 months Ria Bush, MD [employer] at [place], [facility]

## 2018-03-19 NOTE — Telephone Encounter (Signed)
Copied from [place] [id number]. Topic: Quick Communication - Rx Refill/Question >> Mar 19, 2018  4:45 PM Blase Mess A wrote: Medication: VENTOLIN HFA 108 (90 Base) MCG/ACT inhaler [[id number]]  Patient's mail order will not have the inhaler to her next week. She is requesting an emergency script.    Patient is requesting a call back to see if it can be sent today.  Has the patient contacted their pharmacy? Yes  (Agent: If no, request that the patient contact the pharmacy for the refill.) (Agent: If yes, when and what did the pharmacy advise?)  Preferred Pharmacy (with phone number or street name): [id number] [place], [address] [place] [address] Phone: [phone] Fax: [phone]    Agent: Please be advised that RX refills may take up to 3 business days. We ask that you follow-up with your pharmacy.

## 2018-03-20 ENCOUNTER — Ambulatory Visit: Payer: Self-pay

## 2018-03-20 ENCOUNTER — Other Ambulatory Visit: Payer: Self-pay

## 2018-03-20 MED ORDER — VENTOLIN HFA 108 (90 BASE) MCG/ACT IN AERS: 2.0000 | INHALATION_SPRAY | Freq: Four times a day (QID) | RESPIRATORY_TRACT | 1 refills | Status: DC | PRN

## 2018-03-20 NOTE — Telephone Encounter (Signed)
I spoke with pt and Dr Alva Garnet, pulmonologist filled ventolin inhaler on 03/12/18; pt request local refill also; per pt request I transferred pt to Dr Alva Garnet office [phone].

## 2018-03-21 DIAGNOSIS — J449 Chronic obstructive pulmonary disease, unspecified: Secondary | ICD-10-CM | POA: Diagnosis not present

## 2018-03-23 ENCOUNTER — Telehealth: Payer: Self-pay | Admitting: *Deleted

## 2018-03-23 ENCOUNTER — Ambulatory Visit: Payer: Self-pay

## 2018-03-23 ENCOUNTER — Encounter: Payer: Self-pay | Admitting: *Deleted

## 2018-03-23 DIAGNOSIS — J449 Chronic obstructive pulmonary disease, unspecified: Secondary | ICD-10-CM

## 2018-03-23 NOTE — Telephone Encounter (Signed)
Called to check on patient after she has not shown up for pulmonary rehab yet.  She was scheduled to start on 9/30.  Left message.

## 2018-03-25 ENCOUNTER — Ambulatory Visit: Payer: Self-pay

## 2018-03-27 ENCOUNTER — Ambulatory Visit: Payer: Self-pay

## 2018-03-27 ENCOUNTER — Telehealth: Payer: Self-pay

## 2018-03-27 DIAGNOSIS — J449 Chronic obstructive pulmonary disease, unspecified: Secondary | ICD-10-CM
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
## 2018-03-30 ENCOUNTER — Ambulatory Visit: Payer: Medicare HMO | Admitting: Psychology

## 2018-03-30 ENCOUNTER — Ambulatory Visit: Payer: Self-pay

## 2018-03-30 NOTE — Telephone Encounter (Signed)
Received denial letter from [facility] for [employer].   Placed in Dr. Synthia Innocent box.

## 2018-04-01 ENCOUNTER — Ambulatory Visit: Payer: Self-pay

## 2018-04-02 ENCOUNTER — Ambulatory Visit: Payer: Medicare HMO | Admitting: Psychology

## 2018-04-02 NOTE — Telephone Encounter (Addendum)
plz notify patient that [facility] does not offer scooters. They only offer power wheelchairs which patient doesn't qualify for. Will need to go through different DME company - could consider [employer] or other ([place] [employer] etc)

## 2018-04-02 NOTE — Telephone Encounter (Signed)
Left message on vm for pt to call back.  Need to relay Dr. G's message.  

## 2018-04-03 ENCOUNTER — Ambulatory Visit: Payer: Self-pay

## 2018-04-03 NOTE — Telephone Encounter (Signed)
Pt was notified of Dr. Synthia Innocent message by [facility].

## 2018-04-06 ENCOUNTER — Ambulatory Visit: Payer: Self-pay

## 2018-04-06 DIAGNOSIS — J449 Chronic obstructive pulmonary disease, unspecified: Secondary | ICD-10-CM

## 2018-04-06 NOTE — Progress Notes (Signed)
Pulmonary Individual Treatment Plan  Patient Details  Name: Christine Serrano MRN: [id number] Date of Birth: Jul 05, 1960 Referring Provider:     Pulmonary Rehab from 03/09/2018 in [facility] Cardiac and Pulmonary Rehab  Referring Provider  Merton Border MD      Initial Encounter Date:    Pulmonary Rehab from 03/09/2018 in [facility] Cardiac and Pulmonary Rehab  Date  03/09/18      Visit Diagnosis: COPD, severe ([place])  Patient's Home Medications on Admission:  Current Outpatient Medications:  .  aspirin EC 81 MG tablet, Take 81 mg by mouth daily., Disp: , Rfl:  .  benzonatate (TESSALON) 100 MG capsule, Take 1 capsule (100 mg total) by mouth 2 (two) times daily as needed for cough., Disp: 30 capsule, Rfl: 3 .  Cholecalciferol (VITAMIN D) 2000 units tablet, Take 2,000 Units by mouth daily., Disp: , Rfl:  .  clotrimazole (LOTRIMIN) 1 % cream, Apply 1 application topically 2 (two) times daily. For 2 weeks to rash, Disp: 60 g, Rfl: 0 .  fluticasone (FLONASE) 50 MCG/ACT nasal spray, Place 2 sprays into both nostrils daily., Disp: 16 g, Rfl: 3 .  gabapentin (NEURONTIN) 100 MG capsule, Take one tablet nightly for 4 days then twice daily for 4 days then three times daily (Patient taking differently: Take 100 mg by mouth 3 (three) times daily. ), Disp: 90 capsule, Rfl: 3 .  hydrOXYzine (ATARAX/VISTARIL) 25 MG tablet, Take 1 tablet (25 mg total) by mouth 2 (two) times daily as needed for anxiety., Disp: 180 tablet, Rfl: 0 .  ipratropium-albuterol (DUONEB) 0.5-2.5 (3) MG/3ML SOLN, Inhale 3 mLs into the lungs every 6 (six) hours as needed., Disp: 360 mL, Rfl: 3 .  LINZESS 290 MCG CAPS capsule, Take 1 capsule (290 mcg total) by mouth daily., Disp: 90 capsule, Rfl: 3 .  Multiple Vitamins-Minerals (MULTIVITAMIN ADULT) TABS, Take 1 tablet by mouth daily., Disp: , Rfl:  .  Na Sulfate-K Sulfate-Mg Sulf (SUPREP BOWEL PREP KIT) 17.5-3.13-1.6 GM/177ML SOLN, Take 1 kit by mouth as directed., Disp: 324 mL, Rfl: 0 .  naproxen  (NAPROSYN) 375 MG tablet, Take 1 tablet (375 mg total) by mouth 2 (two) times daily as needed for moderate pain., Disp: 40 tablet, Rfl: 1 .  OXYGEN, Inhale 3 L/min into the lungs continuous., Disp: , Rfl:  .  sertraline (ZOLOFT) 100 MG tablet, Take 1.5 tablets (150 mg total) by mouth daily., Disp: 135 tablet, Rfl: 3 .  SYMBICORT 160-4.5 MCG/ACT inhaler, Inhale 2 puffs into the lungs 2 (two) times daily., Disp: 3 Inhaler, Rfl: 3 .  tiotropium (SPIRIVA) 18 MCG inhalation capsule, Place 1 capsule (18 mcg total) into inhaler and inhale daily., Disp: 90 capsule, Rfl: 4 .  traZODone (DESYREL) 50 MG tablet, Take 0.5 tablets (25 mg total) by mouth at bedtime., Disp: 90 tablet, Rfl: 1 .  VENTOLIN HFA 108 (90 Base) MCG/ACT inhaler, Inhale 2 puffs into the lungs every 6 (six) hours as needed for wheezing or shortness of breath., Disp: 54 g, Rfl: 1 .  vitamin B-12 (CYANOCOBALAMIN) 1000 MCG tablet, Take 1,000 mcg by mouth daily., Disp: , Rfl:  .  vitamin E 400 UNIT capsule, Take 400 Units by mouth daily. , Disp: , Rfl:   Past Medical History: Past Medical History:  Diagnosis Date  . Cervical cancer ([place])   . Chronic idiopathic constipation   . COPD (chronic obstructive pulmonary disease) ([place])   . Depression   . Endometriosis   . HLD (hyperlipidemia)  Tobacco Use: Social History   Tobacco Use  Smoking Status Current Some Day Smoker  . Packs/day: 1.00  . Years: 47.00  . Pack years: 47.00  . Types: Cigarettes  Smokeless Tobacco Never Used  Tobacco Comment   Trying to quit smoking    Labs: Recent Review Flowsheet Data    Labs for ITP Cardiac and Pulmonary Rehab Latest Ref Rng & Units 04/04/2009 04/09/2016 02/05/2017 02/06/2018   Cholestrol 0 - 200 mg/dL - - 207(H) 181   LDLCALC 0 - 99 mg/dL - - 137(H) 105(H)   HDL >39.00 mg/dL - - 43.60 45.30   Trlycerides 0.0 - 149.0 mg/dL - - 133.0 153.0(H)   PHART 7.350 - 7.450 7.302(L) 7.30(L) - -   PCO2ART 32.0 - 48.0 mmHg 59.5 CRITICAL RESULT CALLED  TO, READ BACK BY AND VERIFIED WITH: [facility] 04/04/09 AT 1440 CRITICAL RESULT CALLED TO, READ BACK BY AND VERIFIED WITH: [facility] 04/04/09 AT 1440 BY BROADNAX,L.RRT(HH) 65(H) - -   HCO3 20.0 - 28.0 mmol/L 28.5(H) 32.0(H) - -   TCO2 0 - 100 mmol/L 25.9 - - -   O2SAT % 95.5 88.7 - -       Pulmonary Assessment Scores: Pulmonary Assessment Scores    Row Name 03/09/18 1511         ADL UCSD   ADL Phase  Entry     SOB Score total  91     Rest  1     Walk  3     Stairs  5     Bath  4     Dress  3     Shop  5       CAT Score   CAT Score  31       mMRC Score   mMRC Score  4        Pulmonary Function Assessment: Pulmonary Function Assessment - 03/09/18 1510      Initial Spirometry Results   FVC%  48 %    FEV1%  31 %    FEV1/FVC Ratio  53    Comments  Test date 01/15/18      Post Bronchodilator Spirometry Results   FVC%  51 %    FEV1%  29 %    FEV1/FVC Ratio  46    Comments  Test date 01/15/18      Breath   Bilateral Breath Sounds  Wheezes    Shortness of Breath  Yes;Limiting activity;Fear of Shortness of Breath       Exercise Target Goals: Exercise Program Goal: Individual exercise prescription set using results from initial 6 min walk test and THRR while considering  patient's activity barriers and safety.   Exercise Prescription Goal: Initial exercise prescription builds to 30-45 minutes a day of aerobic activity, 2-3 days per week.  Home exercise guidelines will be given to patient during program as part of exercise prescription that the participant will acknowledge.  Activity Barriers & Risk Stratification: Activity Barriers & Cardiac Risk Stratification - 03/09/18 1526      Activity Barriers & Cardiac Risk Stratification   Activity Barriers  Balance Concerns;Deconditioning;Muscular Weakness;Shortness of Breath       6 Minute Walk: 6 Minute Walk    Row Name 03/09/18 1523         6 Minute Walk   Phase  Initial     Distance  365 feet     Walk Time   2 minutes     # of Rest Breaks  0     MPH  2.07     METS  2.59     RPE  18     Perceived Dyspnea   4     VO2 Peak  9.05     Symptoms  Yes (comment)     Comments  SOB, weakness     Resting HR  110 bpm     Resting BP  126/64     Resting Oxygen Saturation   94 %     Exercise Oxygen Saturation  during 6 min walk  76 %     Max Ex. HR  126 bpm     Max Ex. BP  134/84     2 Minute Post BP  126/66       Interval HR   1 Minute HR  123     2 Minute HR  126     2 Minute Post HR  116     Interval Heart Rate?  Yes       Interval Oxygen   Interval Oxygen?  Yes     Baseline Oxygen Saturation %  94 %     1 Minute Oxygen Saturation %  85 %     1 Minute Liters of Oxygen  2 L pulsed     2 Minute Oxygen Saturation %  76 % test terminated at 2 min     2 Minute Liters of Oxygen  2 L     2 Minute Post Oxygen Saturation %  93 %     2 Minute Post Liters of Oxygen  2 L       Oxygen Initial Assessment: Oxygen Initial Assessment - 03/09/18 1509      Home Oxygen   Home Oxygen Device  Home Concentrator;Portable Concentrator;E-Tanks    Sleep Oxygen Prescription  Continuous    Liters per minute  2    Home Exercise Oxygen Prescription  Continuous    Liters per minute  2    Home at Rest Exercise Oxygen Prescription  Continuous    Liters per minute  2    Compliance with Home Oxygen Use  Yes      Initial 6 min Walk   Oxygen Used  Pulsed    Liters per minute  2      Program Oxygen Prescription   Program Oxygen Prescription  Continuous;E-Tanks    Liters per minute  2      Intervention   Short Term Goals  To learn and exhibit compliance with exercise, home and travel O2 prescription;To learn and understand importance of monitoring SPO2 with pulse oximeter and demonstrate accurate use of the pulse oximeter.;To learn and understand importance of maintaining oxygen saturations>88%;To learn and demonstrate proper use of respiratory medications;To learn and demonstrate proper pursed lip breathing  techniques or other breathing techniques.    Long  Term Goals  Exhibits compliance with exercise, home and travel O2 prescription;Verbalizes importance of monitoring SPO2 with pulse oximeter and return demonstration;Maintenance of O2 saturations>88%;Exhibits proper breathing techniques, such as pursed lip breathing or other method taught during program session;Compliance with respiratory medication;Demonstrates proper use of MDI's       Oxygen Re-Evaluation:   Oxygen Discharge (Final Oxygen Re-Evaluation):   Initial Exercise Prescription: Initial Exercise Prescription - 03/09/18 1500      Date of Initial Exercise RX and Referring Provider   Date  03/09/18    Referring Provider  Merton Border MD      Oxygen   Oxygen  Continuous    Liters  3      Treadmill   MPH  2    Grade  0    Minutes  15    METs  2.53      Arm Ergometer   Level  1    Watts  22    RPM  25    Minutes  15    METs  2      T5 Nustep   Level  1    SPM  80    Minutes  15    METs  2      Prescription Details   Frequency (times per week)  3    Duration  Progress to 45 minutes of aerobic exercise without signs/symptoms of physical distress      Intensity   THRR 40-80% of Max Heartrate  130-151    Ratings of Perceived Exertion  11-13    Perceived Dyspnea  0-4      Progression   Progression  Continue to progress workloads to maintain intensity without signs/symptoms of physical distress.      Resistance Training   Training Prescription  Yes    Weight  3 lbs    Reps  10-15       Perform Capillary Blood Glucose checks as needed.  Exercise Prescription Changes: Exercise Prescription Changes    Row Name 03/09/18 1500             Response to Exercise   Blood Pressure (Admit)  126/64       Blood Pressure (Exercise)  134/84       Blood Pressure (Exit)  126/66       Heart Rate (Admit)  110 bpm       Heart Rate (Exercise)  126 bpm       Heart Rate (Exit)  116 bpm       Oxygen Saturation  (Admit)  94 %       Oxygen Saturation (Exercise)  76 %       Oxygen Saturation (Exit)  93 %       Rating of Perceived Exertion (Exercise)  18       Perceived Dyspnea (Exercise)  4       Symptoms  SOB, weakness       Comments  walk test results          Exercise Comments:   Exercise Goals and Review: Exercise Goals    Row Name 03/09/18 1532             Exercise Goals   Increase Physical Activity  Yes       Intervention  Provide advice, education, support and counseling about physical activity/exercise needs.;Develop an individualized exercise prescription for aerobic and resistive training based on initial evaluation findings, risk stratification, comorbidities and participant's personal goals.       Expected Outcomes  Short Term: Attend rehab on a regular basis to increase amount of physical activity.;Long Term: Add in home exercise to make exercise part of routine and to increase amount of physical activity.;Long Term: Exercising regularly at least 3-5 days a week.       Increase Strength and Stamina  Yes       Intervention  Provide advice, education, support and counseling about physical activity/exercise needs.;Develop an individualized exercise prescription for aerobic and resistive training based on initial evaluation findings, risk stratification, comorbidities and participant's personal goals.       Expected Outcomes  Short Term:  Increase workloads from initial exercise prescription for resistance, speed, and METs.;Long Term: Improve cardiorespiratory fitness, muscular endurance and strength as measured by increased METs and functional capacity (6MWT);Short Term: Perform resistance training exercises routinely during rehab and add in resistance training at home       Able to understand and use rate of perceived exertion (RPE) scale  Yes       Intervention  Provide education and explanation on how to use RPE scale       Expected Outcomes  Short Term: Able to use RPE daily in rehab  to express subjective intensity level;Long Term:  Able to use RPE to guide intensity level when exercising independently       Able to understand and use Dyspnea scale  Yes       Intervention  Provide education and explanation on how to use Dyspnea scale       Expected Outcomes  Short Term: Able to use Dyspnea scale daily in rehab to express subjective sense of shortness of breath during exertion;Long Term: Able to use Dyspnea scale to guide intensity level when exercising independently       Knowledge and understanding of Target Heart Rate Range (THRR)  Yes       Intervention  Provide education and explanation of THRR including how the numbers were predicted and where they are located for reference       Expected Outcomes  Short Term: Able to state/look up THRR;Short Term: Able to use daily as guideline for intensity in rehab;Long Term: Able to use THRR to govern intensity when exercising independently       Able to check pulse independently  Yes       Intervention  Provide education and demonstration on how to check pulse in carotid and radial arteries.;Review the importance of being able to check your own pulse for safety during independent exercise       Expected Outcomes  Short Term: Able to explain why pulse checking is important during independent exercise;Long Term: Able to check pulse independently and accurately       Understanding of Exercise Prescription  Yes       Intervention  Provide education, explanation, and written materials on patient's individual exercise prescription       Expected Outcomes  Short Term: Able to explain program exercise prescription;Long Term: Able to explain home exercise prescription to exercise independently          Exercise Goals Re-Evaluation :   Discharge Exercise Prescription (Final Exercise Prescription Changes): Exercise Prescription Changes - 03/09/18 1500      Response to Exercise   Blood Pressure (Admit)  126/64    Blood Pressure (Exercise)   134/84    Blood Pressure (Exit)  126/66    Heart Rate (Admit)  110 bpm    Heart Rate (Exercise)  126 bpm    Heart Rate (Exit)  116 bpm    Oxygen Saturation (Admit)  94 %    Oxygen Saturation (Exercise)  76 %    Oxygen Saturation (Exit)  93 %    Rating of Perceived Exertion (Exercise)  18    Perceived Dyspnea (Exercise)  4    Symptoms  SOB, weakness    Comments  walk test results       Nutrition:  Target Goals: Understanding of nutrition guidelines, daily intake of sodium '1500mg'$ , cholesterol '200mg'$ , calories 30% from fat and 7% or less from saturated fats, daily to have 5 or more servings of fruits  and vegetables.  Biometrics: Pre Biometrics - 03/09/18 1532      Pre Biometrics   Height  5' 4.8" (1.646 m)    Weight  111 lb 11.2 oz (50.7 kg)    Waist Circumference  30 inches    Hip Circumference  36 inches    Waist to Hip Ratio  0.83 %    BMI (Calculated)  18.7    Single Leg Stand  2.56 seconds        Nutrition Therapy Plan and Nutrition Goals: Nutrition Therapy & Goals - 03/09/18 1518      Personal Nutrition Goals   Nutrition Goal  She wants to learn to eat better    Personal Goal #2  She wants to try to gain a little weight    Comments  She states her diet is not good and needs to make a change      Intervention Plan   Intervention  Prescribe, educate and counsel regarding individualized specific dietary modifications aiming towards targeted core components such as weight, hypertension, lipid management, diabetes, heart failure and other comorbidities.;Nutrition handout(s) given to patient.    Expected Outcomes  Short Term Goal: Understand basic principles of dietary content, such as calories, fat, sodium, cholesterol and nutrients.;Long Term Goal: Adherence to prescribed nutrition plan.       Nutrition Assessments: Nutrition Assessments - 03/09/18 1515      MEDFICTS Scores   Pre Score  102       Nutrition Goals Re-Evaluation:   Nutrition Goals Discharge  (Final Nutrition Goals Re-Evaluation):   Psychosocial: Target Goals: Acknowledge presence or absence of significant depression and/or stress, maximize coping skills, provide positive support system. Participant is able to verbalize types and ability to use techniques and skills needed for reducing stress and depression.   Initial Review & Psychosocial Screening: Initial Psych Review & Screening - 03/09/18 1516      Initial Review   Current issues with  Current Depression;History of Depression;Current Sleep Concerns;Current Stress Concerns    Source of Stress Concerns  Chronic Illness;Unable to perform yard/household activities;Unable to participate in former interests or hobbies    Comments  She watns to quit smoking and she is not able to do anyting in the yeard or in the house. She wants to paint her house and has not been able to live like she wants to. She needs to get a lung transplant but needs to stop smoking.      Family Dynamics   Good Support System?  No    Comments  She can look to her late fiances sister who lives with her.      Barriers   Psychosocial barriers to participate in program  The patient should benefit from training in stress management and relaxation.      Screening Interventions   Interventions  Program counselor consult;Encouraged to exercise;To provide support and resources with identified psychosocial needs;Provide feedback about the scores to participant    Expected Outcomes  Short Term goal: Utilizing psychosocial counselor, staff and physician to assist with identification of specific Stressors or current issues interfering with healing process. Setting desired goal for each stressor or current issue identified.;Long Term Goal: Stressors or current issues are controlled or eliminated.;Short Term goal: Identification and review with participant of any Quality of Life or Depression concerns found by scoring the questionnaire.;Long Term goal: The participant  improves quality of Life and PHQ9 Scores as seen by post scores and/or verbalization of changes  Quality of Life Scores:  Scores of 19 and below usually indicate a poorer quality of life in these areas.  A difference of  2-3 points is a clinically meaningful difference.  A difference of 2-3 points in the total score of the Quality of Life Index has been associated with significant improvement in overall quality of life, self-image, physical symptoms, and general health in studies assessing change in quality of life.  PHQ-9: Recent Review Flowsheet Data    Depression screen [facility] 2/9 03/09/2018 02/06/2018 08/18/2017 07/07/2017 02/05/2017   Decreased Interest '2 1 1 3 1   '$ Down, Depressed, Hopeless 3 3 0 2 1   PHQ - 2 Score '5 4 1 5 2   '$ Altered sleeping '3 1 1 3 1   '$ Tired, decreased energy '3 3 1 3 2   '$ Change in appetite 2 1 0 2 0   Feeling bad or failure about yourself  1 1 0 0 0   Trouble concentrating 1 0 1 1 0   Moving slowly or fidgety/restless 1 0 0 1 0   Suicidal thoughts 0 0 0 0 0   PHQ-9 Score '16 10 4 15 5   '$ Difficult doing work/chores Extremely dIfficult Very difficult - - Somewhat difficult     Interpretation of Total Score  Total Score Depression Severity:  1-4 = Minimal depression, 5-9 = Mild depression, 10-14 = Moderate depression, 15-19 = Moderately severe depression, 20-27 = Severe depression   Psychosocial Evaluation and Intervention:   Psychosocial Re-Evaluation:   Psychosocial Discharge (Final Psychosocial Re-Evaluation):   Education: Education Goals: Education classes will be provided on a weekly basis, covering required topics. Participant will state understanding/return demonstration of topics presented.  Learning Barriers/Preferences: Learning Barriers/Preferences - 03/09/18 1515      Learning Barriers/Preferences   Learning Barriers  None    Learning Preferences  None       Education Topics:  Initial Evaluation Education: - Verbal, written and  demonstration of respiratory meds, oximetry and breathing techniques. Instruction on use of nebulizers and MDIs and importance of monitoring MDI activations.   Pulmonary Rehab from 03/09/2018 in [facility] Cardiac and Pulmonary Rehab  Date  03/09/18  Educator  [facility]  Instruction Review Code  1- Verbalizes Understanding      General Nutrition Guidelines/Fats and Fiber: -Group instruction provided by verbal, written material, models and posters to present the general guidelines for heart healthy nutrition. Gives an explanation and review of dietary fats and fiber.   Controlling Sodium/Reading Food Labels: -Group verbal and written material supporting the discussion of sodium use in heart healthy nutrition. Review and explanation with models, verbal and written materials for utilization of the food label.   Exercise Physiology & General Exercise Guidelines: - Group verbal and written instruction with models to review the exercise physiology of the cardiovascular system and associated critical values. Provides general exercise guidelines with specific guidelines to those with heart or lung disease.    Aerobic Exercise & Resistance Training: - Gives group verbal and written instruction on the various components of exercise. Focuses on aerobic and resistive training programs and the benefits of this training and how to safely progress through these programs.   Flexibility, Balance, Mind/Body Relaxation: Provides group verbal/written instruction on the benefits of flexibility and balance training, including mind/body exercise modes such as yoga, pilates and tai chi.  Demonstration and skill practice provided.   Stress and Anxiety: - Provides group verbal and written instruction about the health risks of elevated stress  and causes of high stress.  Discuss the correlation between heart/lung disease and anxiety and treatment options. Review healthy ways to manage with stress and anxiety.   Depression: -  Provides group verbal and written instruction on the correlation between heart/lung disease and depressed mood, treatment options, and the stigmas associated with seeking treatment.   Exercise & Equipment Safety: - Individual verbal instruction and demonstration of equipment use and safety with use of the equipment.   Pulmonary Rehab from 03/09/2018 in [facility] Cardiac and Pulmonary Rehab  Date  03/09/18  Educator  [facility]  Instruction Review Code  1- Verbalizes Understanding      Infection Prevention: - Provides verbal and written material to individual with discussion of infection control including proper hand washing and proper equipment cleaning during exercise session.   Pulmonary Rehab from 03/09/2018 in [facility] Cardiac and Pulmonary Rehab  Date  03/09/18  Educator  [facility]  Instruction Review Code  1- Verbalizes Understanding      Falls Prevention: - Provides verbal and written material to individual with discussion of falls prevention and safety.   Pulmonary Rehab from 03/09/2018 in [facility] Cardiac and Pulmonary Rehab  Date  03/09/18  Educator  [facility]  Instruction Review Code  1- Verbalizes Understanding      Diabetes: - Individual verbal and written instruction to review signs/symptoms of diabetes, desired ranges of glucose level fasting, after meals and with exercise. Advice that pre and post exercise glucose checks will be done for 3 sessions at entry of program.   Chronic Lung Diseases: - Group verbal and written instruction to review updates, respiratory medications, advancements in procedures and treatments. Discuss use of supplemental oxygen including available portable oxygen systems, continuous and intermittent flow rates, concentrators, personal use and safety guidelines. Review proper use of inhaler and spacers. Provide informative websites for self-education.    Energy Conservation: - Provide group verbal and written instruction for methods to conserve energy, plan and organize  activities. Instruct on pacing techniques, use of adaptive equipment and posture/positioning to relieve shortness of breath.   Triggers and Exacerbations: - Group verbal and written instruction to review types of environmental triggers and ways to prevent exacerbations. Discuss weather changes, air quality and the benefits of nasal washing. Review warning signs and symptoms to help prevent infections. Discuss techniques for effective airway clearance, coughing, and vibrations.   Pulmonary Rehab from 08/07/2016 in [facility] Cardiac and Pulmonary Rehab  Date  08/07/16  Educator  [facility]  Instruction Review Code (retired)  2- meets goals/outcomes      AED/CPR: - Group verbal and written instruction with the use of models to demonstrate the basic use of the AED with the basic ABC's of resuscitation.   Anatomy and Physiology of the Lungs: - Group verbal and written instruction with the use of models to provide basic lung anatomy and physiology related to function, structure and complications of lung disease.   Anatomy & Physiology of the Heart: - Group verbal and written instruction and models provide basic cardiac anatomy and physiology, with the coronary electrical and arterial systems. Review of Valvular disease and Heart Failure   Cardiac Medications: - Group verbal and written instruction to review commonly prescribed medications for heart disease. Reviews the medication, class of the drug, and side effects.   Know Your Numbers and Risk Factors: -Group verbal and written instruction about important numbers in your health.  Discussion of what are risk factors and how they play a role in the disease process.  Review of  Cholesterol, Blood Pressure, Diabetes, and BMI and the role they play in your overall health.   Sleep Hygiene: -Provides group verbal and written instruction about how sleep can affect your health.  Define sleep hygiene, discuss sleep cycles and impact of sleep habits. Review good  sleep hygiene tips.    Other: -Provides group and verbal instruction on various topics (see comments)    Knowledge Questionnaire Score: Knowledge Questionnaire Score - 03/09/18 1515      Knowledge Questionnaire Score   Pre Score  15/18   reviewed with patient        Core Components/Risk Factors/Patient Goals at Admission: Personal Goals and Risk Factors at Admission - 03/09/18 1519      Core Components/Risk Factors/Patient Goals on Admission    Weight Management  Yes;Weight Maintenance;Weight Gain    Intervention  Weight Management: Develop a combined nutrition and exercise program designed to reach desired caloric intake, while maintaining appropriate intake of nutrient and fiber, sodium and fats, and appropriate energy expenditure required for the weight goal.;Weight Management: Provide education and appropriate resources to help participant work on and attain dietary goals.    Admit Weight  111 lb 11.2 oz (50.7 kg)    Goal Weight: Short Term  120 lb (54.4 kg)    Goal Weight: Long Term  120 lb (54.4 kg)    Expected Outcomes  Weight Gain: Understanding of general recommendations for a high calorie, high protein meal plan that promotes weight gain by distributing calorie intake throughout the day with the consumption for 4-5 meals, snacks, and/or supplements;Short Term: Continue to assess and modify interventions until short term weight is achieved;Long Term: Adherence to nutrition and physical activity/exercise program aimed toward attainment of established weight goal;Understanding recommendations for meals to include 15-35% energy as protein, 25-35% energy from fat, 35-60% energy from carbohydrates, less than '200mg'$  of dietary cholesterol, 20-35 gm of total fiber daily;Understanding of distribution of calorie intake throughout the day with the consumption of 4-5 meals/snacks    Tobacco Cessation  Yes    Intervention  Assist the participant in steps to quit. Provide individualized  education and counseling about committing to Tobacco Cessation, relapse prevention, and pharmacological support that can be provided by physician.;Advice worker, assist with locating and accessing local/national Quit Smoking programs, and support quit date choice.    Expected Outcomes  Short Term: Will demonstrate readiness to quit, by selecting a quit date.;Short Term: Will quit all tobacco product use, adhering to prevention of relapse plan.;Long Term: Complete abstinence from all tobacco products for at least 12 months from quit date.    Improve shortness of breath with ADL's  Yes    Intervention  Provide education, individualized exercise plan and daily activity instruction to help decrease symptoms of SOB with activities of daily living.    Expected Outcomes  Short Term: Improve cardiorespiratory fitness to achieve a reduction of symptoms when performing ADLs;Long Term: Be able to perform more ADLs without symptoms or delay the onset of symptoms    Lipids  Yes    Intervention  Provide education and support for participant on nutrition & aerobic/resistive exercise along with prescribed medications to achieve LDL '70mg'$ , HDL >'40mg'$ .   she reports higher lipids.   Expected Outcomes  Short Term: Participant states understanding of desired cholesterol values and is compliant with medications prescribed. Participant is following exercise prescription and nutrition guidelines.;Long Term: Cholesterol controlled with medications as prescribed, with individualized exercise RX and with personalized nutrition plan. Value goals: LDL <  $'70mg'V$ , HDL > 40 mg.       Core Components/Risk Factors/Patient Goals Review:    Core Components/Risk Factors/Patient Goals at Discharge (Final Review):    ITP Comments: ITP Comments    Row Name 03/09/18 1600 03/23/18 1439 03/27/18 1236 04/06/18 0858     ITP Comments  Medical Evaluation completed. Chart sent for review and changes to Dr. Emily Filbert Director  of [place]. Diagnosis can be found in [facility] encounter 01/23/18  Called to check on patient after she has not shown up for pulmonary rehab yet.  She was scheduled to start on 9/30.  Left message.   Delphine states she has not been in class due to her roommate using her car. She plans to return as soon as possible.  30 day review completed. ITP sent to Dr. Emily Filbert Director of [place]. Continue with ITP unless changes are made by physician.       Comments: 30 day review

## 2018-04-08 ENCOUNTER — Telehealth: Payer: Self-pay | Admitting: Family Medicine

## 2018-04-08 ENCOUNTER — Ambulatory Visit: Payer: Self-pay

## 2018-04-08 ENCOUNTER — Telehealth: Payer: Self-pay

## 2018-04-08 DIAGNOSIS — J449 Chronic obstructive pulmonary disease, unspecified: Secondary | ICD-10-CM

## 2018-04-08 DIAGNOSIS — Z1239 Encounter for other screening for malignant neoplasm of breast: Secondary | ICD-10-CM

## 2018-04-08 NOTE — Telephone Encounter (Signed)
Patient states that her transportation issues are almost resolved. Patient still wants to do [facility], Will follow up next week with patient.

## 2018-04-08 NOTE — Telephone Encounter (Signed)
Please change screening mammogram to [phone]

## 2018-04-09 NOTE — Telephone Encounter (Signed)
Appointment 12/3  Left message asking pt to call office

## 2018-04-09 NOTE — Telephone Encounter (Signed)
Ordered new mammogram

## 2018-04-10 ENCOUNTER — Ambulatory Visit: Payer: Self-pay

## 2018-04-13 ENCOUNTER — Encounter: Payer: Self-pay | Admitting: Family Medicine

## 2018-04-13 ENCOUNTER — Ambulatory Visit: Payer: Self-pay

## 2018-04-13 NOTE — Telephone Encounter (Signed)
Left message asking pt to call office mailed letter °

## 2018-04-15 ENCOUNTER — Ambulatory Visit: Payer: Self-pay

## 2018-04-17 ENCOUNTER — Ambulatory Visit: Payer: Self-pay

## 2018-04-20 ENCOUNTER — Ambulatory Visit: Payer: Self-pay

## 2018-04-21 ENCOUNTER — Telehealth: Payer: Self-pay

## 2018-04-21 DIAGNOSIS — J449 Chronic obstructive pulmonary disease, unspecified: Secondary | ICD-10-CM | POA: Diagnosis not present

## 2018-04-21 NOTE — Telephone Encounter (Signed)
Left a message for patient to call back to continue pulmonary rehab.

## 2018-04-22 ENCOUNTER — Ambulatory Visit: Payer: Self-pay

## 2018-04-24 ENCOUNTER — Encounter: Payer: Self-pay | Admitting: Family Medicine

## 2018-04-24 ENCOUNTER — Ambulatory Visit (INDEPENDENT_AMBULATORY_CARE_PROVIDER_SITE_OTHER): Payer: Medicare HMO | Admitting: Family Medicine

## 2018-04-24 ENCOUNTER — Ambulatory Visit: Payer: Self-pay

## 2018-04-24 VITALS — BP 116/74 | HR 108 | Temp 98.2°F | Ht 64.5 in | Wt 111.8 lb

## 2018-04-24 DIAGNOSIS — B354 Tinea corporis: Secondary | ICD-10-CM

## 2018-04-24 MED ORDER — TERBINAFINE HCL 250 MG PO TABS: 250.0000 mg | ORAL_TABLET | Freq: Every day | ORAL | 0 refills | Status: DC

## 2018-04-24 NOTE — Patient Instructions (Addendum)
For ongoing ringworm, treat with oral terbinafine once daily for 2 weeks.  Let us know if not improving after treatment.   Body Ringworm Body ringworm is an infection of the skin that often causes a ring-shaped rash. Body ringworm can affect any part of your skin. It can spread easily to others. Body ringworm is also called tinea corporis. What are the causes? This condition is caused by funguses called dermatophytes. The condition develops when these funguses grow out of control on the skin. You can get this condition if you touch a person or animal that has it. You can also get it if you share clothing, bedding, towels, or any other object with an infected person or pet. What increases the risk? This condition is more likely to develop in:  Athletes who often make skin-to-skin contact with other athletes, such as wrestlers.  People who share equipment and mats.  People with a weakened immune system.  What are the signs or symptoms? Symptoms of this condition include:  Itchy, raised red spots and bumps.  Red scaly patches.  A ring-shaped rash. The rash may have: ? A clear center. ? Scales or red bumps at its center. ? Redness near its borders. ? Dry and scaly skin on or around it.  How is this diagnosed? This condition can usually be diagnosed with a skin exam. A skin scraping may be taken from the affected area and examined under a microscope to see if the fungus is present. How is this treated? This condition may be treated with:  An antifungal cream or ointment.  An antifungal shampoo.  Antifungal medicines. These may be prescribed if your ringworm is severe, keeps coming back, or lasts a long time.  Follow these instructions at home:  Take over-the-counter and prescription medicines only as told by your health care provider.  If you were given an antifungal cream or ointment: ? Use it as told by your health care provider. ? Wash the infected area and dry it  completely before applying the cream or ointment.  If you were given an antifungal shampoo: ? Use it as told by your health care provider. ? Leave the shampoo on your body for 3-5 minutes before rinsing.  While you have a rash: ? Wear loose clothing to stop clothes from rubbing and irritating it. ? Wash or change your bed sheets every night.  If your pet has the same infection, take your pet to see a Animal nutritionist. How is this prevented?  Practice good hygiene.  Wear sandals or shoes in public places and showers.  Do not share personal items with others.  Avoid touching red patches of skin on other people.  Avoid touching pets that have bald spots.  If you touch an animal that has a bald spot, wash your hands. Contact a health care provider if:  Your rash continues to spread after 7 days of treatment.  Your rash is not gone in 4 weeks.  The area around your rash gets red, warm, tender, and swollen. This information is not intended to replace advice given to you by your health care provider. Make sure you discuss any questions you have with your health care provider. Document Released: 05/31/2000 Document Revised: 11/09/2015 Document Reviewed: 03/30/2015 Elsevier Interactive Patient Education  [employer].

## 2018-04-24 NOTE — Assessment & Plan Note (Signed)
Continued improvement of KOH proven tinea corporis R chest, but persistence of L elbow rash. Will Rx oral terbinafine 250mg  x2 wks, update if ongoing rash after treatment.

## 2018-04-24 NOTE — Progress Notes (Signed)
BP 116/74 (BP Location: Left Arm, Patient Position: Sitting, Cuff Size: Normal)   Pulse (!) 108   Temp 98.2 F (36.8 C) (Oral)   Ht 5' 4.5" (1.638 m)   Wt 111 lb 12 oz (50.7 kg)   SpO2 94% Comment: 2 L, pulsating  BMI 18.89 kg/m    CC: ringworm f/u.  Subjective:    Patient ID: Christine Serrano, female    DOB: 1961-06-05, 57 y.o.   MRN: [id number]  HPI: Christine Serrano is a 57 y.o. female presenting on 04/24/2018 for Tinea (Here for ringworm f/u. States the initial area on right side of upper chest has healed. But pt now now new areas. )   Initial R upper chest area has healed (+KOH). She also had L elbow tinea appearing rash also treated with clotrimazole. This has never fully healed despite 6 wks of treatment with topical clotrimazole. Very itchy.   No recent prednisone or abx use (last was 6 months ago).   Relevant past medical, surgical, family and social history reviewed and updated as indicated. Interim medical history since our last visit reviewed. Allergies and medications reviewed and updated. Outpatient Medications Prior to Visit  Medication Sig Dispense Refill  . aspirin EC 81 MG tablet Take 81 mg by mouth daily.    . benzonatate (TESSALON) 100 MG capsule Take 1 capsule (100 mg total) by mouth 2 (two) times daily as needed for cough. 30 capsule 3  . Cholecalciferol (VITAMIN D) 2000 units tablet Take 2,000 Units by mouth daily.    . clotrimazole (LOTRIMIN) 1 % cream Apply 1 application topically 2 (two) times daily. For 2 weeks to rash 60 g 0  . fluticasone (FLONASE) 50 MCG/ACT nasal spray Place 2 sprays into both nostrils daily. 16 g 3  . gabapentin (NEURONTIN) 100 MG capsule Take one tablet nightly for 4 days then twice daily for 4 days then three times daily (Patient taking differently: Take 100 mg by mouth 3 (three) times daily. ) 90 capsule 3  . hydrOXYzine (ATARAX/VISTARIL) 25 MG tablet Take 1 tablet (25 mg total) by mouth 2 (two) times daily as needed for anxiety.  180 tablet 0  . ipratropium-albuterol (DUONEB) 0.5-2.5 (3) MG/3ML SOLN Inhale 3 mLs into the lungs every 6 (six) hours as needed. 360 mL 3  . LINZESS 290 MCG CAPS capsule Take 1 capsule (290 mcg total) by mouth daily. 90 capsule 3  . Multiple Vitamins-Minerals (MULTIVITAMIN ADULT) TABS Take 1 tablet by mouth daily.    . Na Sulfate-K Sulfate-Mg Sulf (SUPREP BOWEL PREP KIT) 17.5-3.13-1.6 GM/177ML SOLN Take 1 kit by mouth as directed. 324 mL 0  . naproxen (NAPROSYN) 375 MG tablet Take 1 tablet (375 mg total) by mouth 2 (two) times daily as needed for moderate pain. 40 tablet 1  . OXYGEN Inhale 3 L/min into the lungs continuous.    . sertraline (ZOLOFT) 100 MG tablet Take 1.5 tablets (150 mg total) by mouth daily. 135 tablet 3  . SYMBICORT 160-4.5 MCG/ACT inhaler Inhale 2 puffs into the lungs 2 (two) times daily. 3 Inhaler 3  . tiotropium (SPIRIVA) 18 MCG inhalation capsule Place 1 capsule (18 mcg total) into inhaler and inhale daily. 90 capsule 4  . traZODone (DESYREL) 50 MG tablet Take 0.5 tablets (25 mg total) by mouth at bedtime. 90 tablet 1  . VENTOLIN HFA 108 (90 Base) MCG/ACT inhaler Inhale 2 puffs into the lungs every 6 (six) hours as needed for wheezing or shortness of breath.  54 g 1  . vitamin B-12 (CYANOCOBALAMIN) 1000 MCG tablet Take 1,000 mcg by mouth daily.    . vitamin E 400 UNIT capsule Take 400 Units by mouth daily.      No facility-administered medications prior to visit.      Per HPI unless specifically indicated in ROS section below Review of Systems     Objective:    BP 116/74 (BP Location: Left Arm, Patient Position: Sitting, Cuff Size: Normal)   Pulse (!) 108   Temp 98.2 F (36.8 C) (Oral)   Ht 5' 4.5" (1.638 m)   Wt 111 lb 12 oz (50.7 kg)   SpO2 94% Comment: 2 L, pulsating  BMI 18.89 kg/m   Wt Readings from Last 3 Encounters:  04/24/18 111 lb 12 oz (50.7 kg)  03/17/18 113 lb 4 oz (51.4 kg)  03/09/18 111 lb 11.2 oz (50.7 kg)    Physical Exam  Constitutional:  She appears well-developed and well-nourished. No distress.  Skin: Skin is warm and dry. Rash noted. No erythema.  Pruritic annular rash at L elbow with scaling at edges and central clearing  Nursing note and vitals reviewed.  Results for orders placed or performed in visit on 02/06/18  CBC with Differential/Platelet  Result Value Ref Range   WBC 6.3 4.0 - 10.5 K/uL   RBC 4.60 3.87 - 5.11 Mil/uL   Hemoglobin 13.2 12.0 - 15.0 g/dL   HCT 39.3 36.0 - 46.0 %   MCV 85.5 78.0 - 100.0 fl   MCHC 33.5 30.0 - 36.0 g/dL   RDW 15.0 11.5 - 15.5 %   Platelets 204.0 150.0 - 400.0 K/uL   Neutrophils Relative % 53.1 43.0 - 77.0 %   Lymphocytes Relative 36.5 12.0 - 46.0 %   Monocytes Relative 6.8 3.0 - 12.0 %   Eosinophils Relative 3.2 0.0 - 5.0 %   Basophils Relative 0.4 0.0 - 3.0 %   Neutro Abs 3.4 1.4 - 7.7 K/uL   Lymphs Abs 2.3 0.7 - 4.0 K/uL   Monocytes Absolute 0.4 0.1 - 1.0 K/uL   Eosinophils Absolute 0.2 0.0 - 0.7 K/uL   Basophils Absolute 0.0 0.0 - 0.1 K/uL  TSH  Result Value Ref Range   TSH 1.20 0.35 - 4.50 uIU/mL  Comprehensive metabolic panel  Result Value Ref Range   Sodium 139 135 - 145 mEq/L   Potassium 4.2 3.5 - 5.1 mEq/L   Chloride 99 96 - 112 mEq/L   CO2 35 (H) 19 - 32 mEq/L   Glucose, Bld 89 70 - 99 mg/dL   BUN 9 6 - 23 mg/dL   Creatinine, Ser 0.66 0.40 - 1.20 mg/dL   Total Bilirubin 0.4 0.2 - 1.2 mg/dL   Alkaline Phosphatase 72 39 - 117 U/L   AST 15 0 - 37 U/L   ALT 7 0 - 35 U/L   Total Protein 6.7 6.0 - 8.3 g/dL   Albumin 4.1 3.5 - 5.2 g/dL   Calcium 9.9 8.4 - 10.5 mg/dL   GFR 97.40 >60.00 mL/min  Lipid panel  Result Value Ref Range   Cholesterol 181 0 - 200 mg/dL   Triglycerides 153.0 (H) 0.0 - 149.0 mg/dL   HDL 45.30 >39.00 mg/dL   VLDL 30.6 0.0 - 40.0 mg/dL   LDL Cholesterol 105 (H) 0 - 99 mg/dL   Total CHOL/HDL Ratio 4    NonHDL 135.96       Assessment & Plan:   Problem List Items Addressed This Visit  Tinea corporis - Primary    Continued  improvement of KOH proven tinea corporis R chest, but persistence of L elbow rash. Will Rx oral terbinafine 227m x2 wks, update if ongoing rash after treatment.       Relevant Medications   terbinafine (LAMISIL) 250 MG tablet       Meds ordered this encounter  Medications  . terbinafine (LAMISIL) 250 MG tablet    Sig: Take 1 tablet (250 mg total) by mouth daily.    Dispense:  14 tablet    Refill:  0   No orders of the defined types were placed in this encounter.   Follow up plan: Return if symptoms worsen or fail to improve.  JRia Bush MD

## 2018-04-27 ENCOUNTER — Ambulatory Visit: Payer: Self-pay

## 2018-04-29 ENCOUNTER — Ambulatory Visit: Payer: Self-pay

## 2018-04-29 ENCOUNTER — Telehealth: Payer: Self-pay

## 2018-04-29 DIAGNOSIS — J449 Chronic obstructive pulmonary disease, unspecified: Secondary | ICD-10-CM

## 2018-04-29 NOTE — Telephone Encounter (Signed)
Called patient for the third time. Left message for patient that she will be discharged from the program today and to call if she has any questions.

## 2018-04-29 NOTE — Progress Notes (Signed)
Pulmonary Individual Treatment Plan  Patient Details  Name: Christine Serrano MRN: [id number] Date of Birth: [birth year] Referring Provider:     Pulmonary Rehab from 03/09/2018 in [facility] Cardiac and Pulmonary Rehab  Referring Provider  Merton Border MD      Initial Encounter Date:    Pulmonary Rehab from 03/09/2018 in [facility] Cardiac and Pulmonary Rehab  Date  03/09/18      Visit Diagnosis: COPD, severe ([place])  Patient's Home Medications on Admission:  Current Outpatient Medications:  .  aspirin EC 81 MG tablet, Take 81 mg by mouth daily., Disp: , Rfl:  .  benzonatate (TESSALON) 100 MG capsule, Take 1 capsule (100 mg total) by mouth 2 (two) times daily as needed for cough., Disp: 30 capsule, Rfl: 3 .  Cholecalciferol (VITAMIN D) 2000 units tablet, Take 2,000 Units by mouth daily., Disp: , Rfl:  .  clotrimazole (LOTRIMIN) 1 % cream, Apply 1 application topically 2 (two) times daily. For 2 weeks to rash, Disp: 60 g, Rfl: 0 .  fluticasone (FLONASE) 50 MCG/ACT nasal spray, Place 2 sprays into both nostrils daily., Disp: 16 g, Rfl: 3 .  gabapentin (NEURONTIN) 100 MG capsule, Take one tablet nightly for 4 days then twice daily for 4 days then three times daily (Patient taking differently: Take 100 mg by mouth 3 (three) times daily. ), Disp: 90 capsule, Rfl: 3 .  hydrOXYzine (ATARAX/VISTARIL) 25 MG tablet, Take 1 tablet (25 mg total) by mouth 2 (two) times daily as needed for anxiety., Disp: 180 tablet, Rfl: 0 .  ipratropium-albuterol (DUONEB) 0.5-2.5 (3) MG/3ML SOLN, Inhale 3 mLs into the lungs every 6 (six) hours as needed., Disp: 360 mL, Rfl: 3 .  LINZESS 290 MCG CAPS capsule, Take 1 capsule (290 mcg total) by mouth daily., Disp: 90 capsule, Rfl: 3 .  Multiple Vitamins-Minerals (MULTIVITAMIN ADULT) TABS, Take 1 tablet by mouth daily., Disp: , Rfl:  .  Na Sulfate-K Sulfate-Mg Sulf (SUPREP BOWEL PREP KIT) 17.5-3.13-1.6 GM/177ML SOLN, Take 1 kit by mouth as directed., Disp: 324 mL, Rfl: 0 .  naproxen  (NAPROSYN) 375 MG tablet, Take 1 tablet (375 mg total) by mouth 2 (two) times daily as needed for moderate pain., Disp: 40 tablet, Rfl: 1 .  OXYGEN, Inhale 3 L/min into the lungs continuous., Disp: , Rfl:  .  sertraline (ZOLOFT) 100 MG tablet, Take 1.5 tablets (150 mg total) by mouth daily., Disp: 135 tablet, Rfl: 3 .  SYMBICORT 160-4.5 MCG/ACT inhaler, Inhale 2 puffs into the lungs 2 (two) times daily., Disp: 3 Inhaler, Rfl: 3 .  terbinafine (LAMISIL) 250 MG tablet, Take 1 tablet (250 mg total) by mouth daily., Disp: 14 tablet, Rfl: 0 .  tiotropium (SPIRIVA) 18 MCG inhalation capsule, Place 1 capsule (18 mcg total) into inhaler and inhale daily., Disp: 90 capsule, Rfl: 4 .  traZODone (DESYREL) 50 MG tablet, Take 0.5 tablets (25 mg total) by mouth at bedtime., Disp: 90 tablet, Rfl: 1 .  VENTOLIN HFA 108 (90 Base) MCG/ACT inhaler, Inhale 2 puffs into the lungs every 6 (six) hours as needed for wheezing or shortness of breath., Disp: 54 g, Rfl: 1 .  vitamin B-12 (CYANOCOBALAMIN) 1000 MCG tablet, Take 1,000 mcg by mouth daily., Disp: , Rfl:  .  vitamin E 400 UNIT capsule, Take 400 Units by mouth daily. , Disp: , Rfl:   Past Medical History: Past Medical History:  Diagnosis Date  . Cervical cancer ([place])   . Chronic idiopathic constipation   . COPD (chronic  obstructive pulmonary disease) ([place])   . Depression   . Endometriosis   . HLD (hyperlipidemia)     Tobacco Use: Social History   Tobacco Use  Smoking Status Current Some Day Smoker  . Packs/day: 1.00  . Years: 47.00  . Pack years: 47.00  . Types: Cigarettes  Smokeless Tobacco Never Used  Tobacco Comment   Trying to quit smoking    Labs: Recent Review Flowsheet Data    Labs for ITP Cardiac and Pulmonary Rehab Latest Ref Rng & Units 04/04/2009 04/09/2016 02/05/2017 02/06/2018   Cholestrol 0 - 200 mg/dL - - 207(H) 181   LDLCALC 0 - 99 mg/dL - - 137(H) 105(H)   HDL >39.00 mg/dL - - 43.60 45.30   Trlycerides 0.0 - 149.0 mg/dL - -  133.0 153.0(H)   PHART 7.350 - 7.450 7.302(L) 7.30(L) - -   PCO2ART 32.0 - 48.0 mmHg 59.5 CRITICAL RESULT CALLED TO, READ BACK BY AND VERIFIED WITH: [facility] 04/04/09 AT 1440 CRITICAL RESULT CALLED TO, READ BACK BY AND VERIFIED WITH: [place] 04/04/09 AT 1440 BY BROADNAX,L.RRT(HH) 65(H) - -   HCO3 20.0 - 28.0 mmol/L 28.5(H) 32.0(H) - -   TCO2 0 - 100 mmol/L 25.9 - - -   O2SAT % 95.5 88.7 - -       Pulmonary Assessment Scores: Pulmonary Assessment Scores    Row Name 03/09/18 1511         ADL UCSD   ADL Phase  Entry     SOB Score total  91     Rest  1     Walk  3     Stairs  5     Bath  4     Dress  3     Shop  5       CAT Score   CAT Score  31       mMRC Score   mMRC Score  4        Pulmonary Function Assessment: Pulmonary Function Assessment - 03/09/18 1510      Initial Spirometry Results   FVC%  48 %    FEV1%  31 %    FEV1/FVC Ratio  53    Comments  Test date 01/15/18      Post Bronchodilator Spirometry Results   FVC%  51 %    FEV1%  29 %    FEV1/FVC Ratio  46    Comments  Test date 01/15/18      Breath   Bilateral Breath Sounds  Wheezes    Shortness of Breath  Yes;Limiting activity;Fear of Shortness of Breath       Exercise Target Goals: Exercise Program Goal: Individual exercise prescription set using results from initial 6 min walk test and THRR while considering  patient's activity barriers and safety.   Exercise Prescription Goal: Initial exercise prescription builds to 30-45 minutes a day of aerobic activity, 2-3 days per week.  Home exercise guidelines will be given to patient during program as part of exercise prescription that the participant will acknowledge.  Activity Barriers & Risk Stratification: Activity Barriers & Cardiac Risk Stratification - 03/09/18 1526      Activity Barriers & Cardiac Risk Stratification   Activity Barriers  Balance Concerns;Deconditioning;Muscular Weakness;Shortness of Breath       6 Minute Walk: 6 Minute  Walk    Row Name 03/09/18 1523         6 Minute Walk   Phase  Initial     Distance  365 feet     Walk Time  2 minutes     # of Rest Breaks  0     MPH  2.07     METS  2.59     RPE  18     Perceived Dyspnea   4     VO2 Peak  9.05     Symptoms  Yes (comment)     Comments  SOB, weakness     Resting HR  110 bpm     Resting BP  126/64     Resting Oxygen Saturation   94 %     Exercise Oxygen Saturation  during 6 min walk  76 %     Max Ex. HR  126 bpm     Max Ex. BP  134/84     2 Minute Post BP  126/66       Interval HR   1 Minute HR  123     2 Minute HR  126     2 Minute Post HR  116     Interval Heart Rate?  Yes       Interval Oxygen   Interval Oxygen?  Yes     Baseline Oxygen Saturation %  94 %     1 Minute Oxygen Saturation %  85 %     1 Minute Liters of Oxygen  2 L pulsed     2 Minute Oxygen Saturation %  76 % test terminated at 2 min     2 Minute Liters of Oxygen  2 L     2 Minute Post Oxygen Saturation %  93 %     2 Minute Post Liters of Oxygen  2 L       Oxygen Initial Assessment: Oxygen Initial Assessment - 03/09/18 1509      Home Oxygen   Home Oxygen Device  Home Concentrator;Portable Concentrator;E-Tanks    Sleep Oxygen Prescription  Continuous    Liters per minute  2    Home Exercise Oxygen Prescription  Continuous    Liters per minute  2    Home at Rest Exercise Oxygen Prescription  Continuous    Liters per minute  2    Compliance with Home Oxygen Use  Yes      Initial 6 min Walk   Oxygen Used  Pulsed    Liters per minute  2      Program Oxygen Prescription   Program Oxygen Prescription  Continuous;E-Tanks    Liters per minute  2      Intervention   Short Term Goals  To learn and exhibit compliance with exercise, home and travel O2 prescription;To learn and understand importance of monitoring SPO2 with pulse oximeter and demonstrate accurate use of the pulse oximeter.;To learn and understand importance of maintaining oxygen saturations>88%;To learn  and demonstrate proper use of respiratory medications;To learn and demonstrate proper pursed lip breathing techniques or other breathing techniques.    Long  Term Goals  Exhibits compliance with exercise, home and travel O2 prescription;Verbalizes importance of monitoring SPO2 with pulse oximeter and return demonstration;Maintenance of O2 saturations>88%;Exhibits proper breathing techniques, such as pursed lip breathing or other method taught during program session;Compliance with respiratory medication;Demonstrates proper use of MDI's       Oxygen Re-Evaluation:   Oxygen Discharge (Final Oxygen Re-Evaluation):   Initial Exercise Prescription: Initial Exercise Prescription - 03/09/18 1500      Date of Initial Exercise RX and Referring Provider   Date  03/09/18    Referring Provider  Merton Border MD      Oxygen   Oxygen  Continuous    Liters  3      Treadmill   MPH  2    Grade  0    Minutes  15    METs  2.53      Arm Ergometer   Level  1    Watts  22    RPM  25    Minutes  15    METs  2      T5 Nustep   Level  1    SPM  80    Minutes  15    METs  2      Prescription Details   Frequency (times per week)  3    Duration  Progress to 45 minutes of aerobic exercise without signs/symptoms of physical distress      Intensity   THRR 40-80% of Max Heartrate  130-151    Ratings of Perceived Exertion  11-13    Perceived Dyspnea  0-4      Progression   Progression  Continue to progress workloads to maintain intensity without signs/symptoms of physical distress.      Resistance Training   Training Prescription  Yes    Weight  3 lbs    Reps  10-15       Perform Capillary Blood Glucose checks as needed.  Exercise Prescription Changes: Exercise Prescription Changes    Row Name 03/09/18 1500             Response to Exercise   Blood Pressure (Admit)  126/64       Blood Pressure (Exercise)  134/84       Blood Pressure (Exit)  126/66       Heart Rate (Admit)   110 bpm       Heart Rate (Exercise)  126 bpm       Heart Rate (Exit)  116 bpm       Oxygen Saturation (Admit)  94 %       Oxygen Saturation (Exercise)  76 %       Oxygen Saturation (Exit)  93 %       Rating of Perceived Exertion (Exercise)  18       Perceived Dyspnea (Exercise)  4       Symptoms  SOB, weakness       Comments  walk test results          Exercise Comments:   Exercise Goals and Review: Exercise Goals    Row Name 03/09/18 1532             Exercise Goals   Increase Physical Activity  Yes       Intervention  Provide advice, education, support and counseling about physical activity/exercise needs.;Develop an individualized exercise prescription for aerobic and resistive training based on initial evaluation findings, risk stratification, comorbidities and participant's personal goals.       Expected Outcomes  Short Term: Attend rehab on a regular basis to increase amount of physical activity.;Long Term: Add in home exercise to make exercise part of routine and to increase amount of physical activity.;Long Term: Exercising regularly at least 3-5 days a week.       Increase Strength and Stamina  Yes       Intervention  Provide advice, education, support and counseling about physical activity/exercise needs.;Develop an individualized exercise prescription for aerobic and resistive training based on initial  evaluation findings, risk stratification, comorbidities and participant's personal goals.       Expected Outcomes  Short Term: Increase workloads from initial exercise prescription for resistance, speed, and METs.;Long Term: Improve cardiorespiratory fitness, muscular endurance and strength as measured by increased METs and functional capacity (6MWT);Short Term: Perform resistance training exercises routinely during rehab and add in resistance training at home       Able to understand and use rate of perceived exertion (RPE) scale  Yes       Intervention  Provide education and  explanation on how to use RPE scale       Expected Outcomes  Short Term: Able to use RPE daily in rehab to express subjective intensity level;Long Term:  Able to use RPE to guide intensity level when exercising independently       Able to understand and use Dyspnea scale  Yes       Intervention  Provide education and explanation on how to use Dyspnea scale       Expected Outcomes  Short Term: Able to use Dyspnea scale daily in rehab to express subjective sense of shortness of breath during exertion;Long Term: Able to use Dyspnea scale to guide intensity level when exercising independently       Knowledge and understanding of Target Heart Rate Range (THRR)  Yes       Intervention  Provide education and explanation of THRR including how the numbers were predicted and where they are located for reference       Expected Outcomes  Short Term: Able to state/look up THRR;Short Term: Able to use daily as guideline for intensity in rehab;Long Term: Able to use THRR to govern intensity when exercising independently       Able to check pulse independently  Yes       Intervention  Provide education and demonstration on how to check pulse in carotid and radial arteries.;Review the importance of being able to check your own pulse for safety during independent exercise       Expected Outcomes  Short Term: Able to explain why pulse checking is important during independent exercise;Long Term: Able to check pulse independently and accurately       Understanding of Exercise Prescription  Yes       Intervention  Provide education, explanation, and written materials on patient's individual exercise prescription       Expected Outcomes  Short Term: Able to explain program exercise prescription;Long Term: Able to explain home exercise prescription to exercise independently          Exercise Goals Re-Evaluation :   Discharge Exercise Prescription (Final Exercise Prescription Changes): Exercise Prescription Changes -  03/09/18 1500      Response to Exercise   Blood Pressure (Admit)  126/64    Blood Pressure (Exercise)  134/84    Blood Pressure (Exit)  126/66    Heart Rate (Admit)  110 bpm    Heart Rate (Exercise)  126 bpm    Heart Rate (Exit)  116 bpm    Oxygen Saturation (Admit)  94 %    Oxygen Saturation (Exercise)  76 %    Oxygen Saturation (Exit)  93 %    Rating of Perceived Exertion (Exercise)  18    Perceived Dyspnea (Exercise)  4    Symptoms  SOB, weakness    Comments  walk test results       Nutrition:  Target Goals: Understanding of nutrition guidelines, daily intake of sodium '1500mg'$ , cholesterol '200mg'$ ,  calories 30% from fat and 7% or less from saturated fats, daily to have 5 or more servings of fruits and vegetables.  Biometrics: Pre Biometrics - 03/09/18 1532      Pre Biometrics   Height  5' 4.8" (1.646 m)    Weight  111 lb 11.2 oz (50.7 kg)    Waist Circumference  30 inches    Hip Circumference  36 inches    Waist to Hip Ratio  0.83 %    BMI (Calculated)  18.7    Single Leg Stand  2.56 seconds        Nutrition Therapy Plan and Nutrition Goals: Nutrition Therapy & Goals - 03/09/18 1518      Personal Nutrition Goals   Nutrition Goal  She wants to learn to eat better    Personal Goal #2  She wants to try to gain a little weight    Comments  She states her diet is not good and needs to make a change      Intervention Plan   Intervention  Prescribe, educate and counsel regarding individualized specific dietary modifications aiming towards targeted core components such as weight, hypertension, lipid management, diabetes, heart failure and other comorbidities.;Nutrition handout(s) given to patient.    Expected Outcomes  Short Term Goal: Understand basic principles of dietary content, such as calories, fat, sodium, cholesterol and nutrients.;Long Term Goal: Adherence to prescribed nutrition plan.       Nutrition Assessments: Nutrition Assessments - 03/09/18 1515       MEDFICTS Scores   Pre Score  102       Nutrition Goals Re-Evaluation:   Nutrition Goals Discharge (Final Nutrition Goals Re-Evaluation):   Psychosocial: Target Goals: Acknowledge presence or absence of significant depression and/or stress, maximize coping skills, provide positive support system. Participant is able to verbalize types and ability to use techniques and skills needed for reducing stress and depression.   Initial Review & Psychosocial Screening: Initial Psych Review & Screening - 03/09/18 1516      Initial Review   Current issues with  Current Depression;History of Depression;Current Sleep Concerns;Current Stress Concerns    Source of Stress Concerns  Chronic Illness;Unable to perform yard/household activities;Unable to participate in former interests or hobbies    Comments  She watns to quit smoking and she is not able to do anyting in the yeard or in the house. She wants to paint her house and has not been able to live like she wants to. She needs to get a lung transplant but needs to stop smoking.      Family Dynamics   Good Support System?  No    Comments  She can look to her late fiances sister who lives with her.      Barriers   Psychosocial barriers to participate in program  The patient should benefit from training in stress management and relaxation.      Screening Interventions   Interventions  Program counselor consult;Encouraged to exercise;To provide support and resources with identified psychosocial needs;Provide feedback about the scores to participant    Expected Outcomes  Short Term goal: Utilizing psychosocial counselor, staff and physician to assist with identification of specific Stressors or current issues interfering with healing process. Setting desired goal for each stressor or current issue identified.;Long Term Goal: Stressors or current issues are controlled or eliminated.;Short Term goal: Identification and review with participant of any Quality  of Life or Depression concerns found by scoring the questionnaire.;Long Term goal: The participant improves  quality of Life and PHQ9 Scores as seen by post scores and/or verbalization of changes       Quality of Life Scores:  Scores of 19 and below usually indicate a poorer quality of life in these areas.  A difference of  2-3 points is a clinically meaningful difference.  A difference of 2-3 points in the total score of the Quality of Life Index has been associated with significant improvement in overall quality of life, self-image, physical symptoms, and general health in studies assessing change in quality of life.  PHQ-9: Recent Review Flowsheet Data    Depression screen [facility] 2/9 03/09/2018 02/06/2018 08/18/2017 07/07/2017 02/05/2017   Decreased Interest '2 1 1 3 1   '$ Down, Depressed, Hopeless 3 3 0 2 1   PHQ - 2 Score '5 4 1 5 2   '$ Altered sleeping '3 1 1 3 1   '$ Tired, decreased energy '3 3 1 3 2   '$ Change in appetite 2 1 0 2 0   Feeling bad or failure about yourself  1 1 0 0 0   Trouble concentrating 1 0 1 1 0   Moving slowly or fidgety/restless 1 0 0 1 0   Suicidal thoughts 0 0 0 0 0   PHQ-9 Score '16 10 4 15 5   '$ Difficult doing work/chores Extremely dIfficult Very difficult - - Somewhat difficult     Interpretation of Total Score  Total Score Depression Severity:  1-4 = Minimal depression, 5-9 = Mild depression, 10-14 = Moderate depression, 15-19 = Moderately severe depression, 20-27 = Severe depression   Psychosocial Evaluation and Intervention:   Psychosocial Re-Evaluation:   Psychosocial Discharge (Final Psychosocial Re-Evaluation):   Education: Education Goals: Education classes will be provided on a weekly basis, covering required topics. Participant will state understanding/return demonstration of topics presented.  Learning Barriers/Preferences: Learning Barriers/Preferences - 03/09/18 1515      Learning Barriers/Preferences   Learning Barriers  None    Learning  Preferences  None       Education Topics:  Initial Evaluation Education: - Verbal, written and demonstration of respiratory meds, oximetry and breathing techniques. Instruction on use of nebulizers and MDIs and importance of monitoring MDI activations.   Pulmonary Rehab from 03/09/2018 in [facility] Cardiac and Pulmonary Rehab  Date  03/09/18  Educator  [facility]  Instruction Review Code  1- Verbalizes Understanding      General Nutrition Guidelines/Fats and Fiber: -Group instruction provided by verbal, written material, models and posters to present the general guidelines for heart healthy nutrition. Gives an explanation and review of dietary fats and fiber.   Controlling Sodium/Reading Food Labels: -Group verbal and written material supporting the discussion of sodium use in heart healthy nutrition. Review and explanation with models, verbal and written materials for utilization of the food label.   Exercise Physiology & General Exercise Guidelines: - Group verbal and written instruction with models to review the exercise physiology of the cardiovascular system and associated critical values. Provides general exercise guidelines with specific guidelines to those with heart or lung disease.    Aerobic Exercise & Resistance Training: - Gives group verbal and written instruction on the various components of exercise. Focuses on aerobic and resistive training programs and the benefits of this training and how to safely progress through these programs.   Flexibility, Balance, Mind/Body Relaxation: Provides group verbal/written instruction on the benefits of flexibility and balance training, including mind/body exercise modes such as yoga, pilates and tai chi.  Demonstration and skill  practice provided.   Stress and Anxiety: - Provides group verbal and written instruction about the health risks of elevated stress and causes of high stress.  Discuss the correlation between heart/lung disease and  anxiety and treatment options. Review healthy ways to manage with stress and anxiety.   Depression: - Provides group verbal and written instruction on the correlation between heart/lung disease and depressed mood, treatment options, and the stigmas associated with seeking treatment.   Exercise & Equipment Safety: - Individual verbal instruction and demonstration of equipment use and safety with use of the equipment.   Pulmonary Rehab from 03/09/2018 in [facility] Cardiac and Pulmonary Rehab  Date  03/09/18  Educator  [facility]  Instruction Review Code  1- Verbalizes Understanding      Infection Prevention: - Provides verbal and written material to individual with discussion of infection control including proper hand washing and proper equipment cleaning during exercise session.   Pulmonary Rehab from 03/09/2018 in [facility] Cardiac and Pulmonary Rehab  Date  03/09/18  Educator  [facility]  Instruction Review Code  1- Verbalizes Understanding      Falls Prevention: - Provides verbal and written material to individual with discussion of falls prevention and safety.   Pulmonary Rehab from 03/09/2018 in [facility] Cardiac and Pulmonary Rehab  Date  03/09/18  Educator  [facility]  Instruction Review Code  1- Verbalizes Understanding      Diabetes: - Individual verbal and written instruction to review signs/symptoms of diabetes, desired ranges of glucose level fasting, after meals and with exercise. Advice that pre and post exercise glucose checks will be done for 3 sessions at entry of program.   Chronic Lung Diseases: - Group verbal and written instruction to review updates, respiratory medications, advancements in procedures and treatments. Discuss use of supplemental oxygen including available portable oxygen systems, continuous and intermittent flow rates, concentrators, personal use and safety guidelines. Review proper use of inhaler and spacers. Provide informative websites for self-education.    Energy  Conservation: - Provide group verbal and written instruction for methods to conserve energy, plan and organize activities. Instruct on pacing techniques, use of adaptive equipment and posture/positioning to relieve shortness of breath.   Triggers and Exacerbations: - Group verbal and written instruction to review types of environmental triggers and ways to prevent exacerbations. Discuss weather changes, air quality and the benefits of nasal washing. Review warning signs and symptoms to help prevent infections. Discuss techniques for effective airway clearance, coughing, and vibrations.   Pulmonary Rehab from 08/07/2016 in [facility] Cardiac and Pulmonary Rehab  Date  08/07/16  Educator  [facility]  Instruction Review Code (retired)  2- meets goals/outcomes      AED/CPR: - Group verbal and written instruction with the use of models to demonstrate the basic use of the AED with the basic ABC's of resuscitation.   Anatomy and Physiology of the Lungs: - Group verbal and written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

## 2018-04-29 NOTE — Progress Notes (Signed)
Discharge Progress Report  Patient Details  Name: Christine Serrano MRN: [id number] Date of Birth: 08-08-1960 Referring Provider:     Pulmonary Rehab from 03/09/2018 in [facility]  Referring Provider  Merton Border MD       Number of Visits: 1/36  Reason for Discharge:  Early Exit:  Lack of attendance  Smoking History:  Social History   Tobacco Use  Smoking Status Current Some Day Smoker  . Packs/day: 1.00  . Years: 47.00  . Pack years: 47.00  . Types: Cigarettes  Smokeless Tobacco Never Used  Tobacco Comment   Trying to quit smoking    Diagnosis:  COPD, severe ([place])  ADL UCSD: Pulmonary Assessment Scores    Row Name 03/09/18 1511         ADL UCSD   ADL Phase  Entry     SOB Score total  91     Rest  1     Walk  3     Stairs  5     Bath  4     Dress  3     Shop  5       CAT Score   CAT Score  31       mMRC Score   mMRC Score  4        Initial Exercise Prescription: Initial Exercise Prescription - 03/09/18 1500      Date of Initial Exercise RX and Referring Provider   Date  03/09/18    Referring Provider  Merton Border MD      Oxygen   Oxygen  Continuous    Liters  3      Treadmill   MPH  2    Grade  0    Minutes  15    METs  2.53      Arm Ergometer   Level  1    Watts  22    RPM  25    Minutes  15    METs  2      T5 Nustep   Level  1    SPM  80    Minutes  15    METs  2      Prescription Details   Frequency (times per week)  3    Duration  Progress to 45 minutes of aerobic exercise without signs/symptoms of physical distress      Intensity   THRR 40-80% of Max Heartrate  130-151    Ratings of Perceived Exertion  11-13    Perceived Dyspnea  0-4      Progression   Progression  Continue to progress workloads to maintain intensity without signs/symptoms of physical distress.      Resistance Training   Training Prescription  Yes    Weight  3 lbs    Reps  10-15       Discharge Exercise  Prescription (Final Exercise Prescription Changes): Exercise Prescription Changes - 03/09/18 1500      Response to Exercise   Blood Pressure (Admit)  126/64    Blood Pressure (Exercise)  134/84    Blood Pressure (Exit)  126/66    Heart Rate (Admit)  110 bpm    Heart Rate (Exercise)  126 bpm    Heart Rate (Exit)  116 bpm    Oxygen Saturation (Admit)  94 %    Oxygen Saturation (Exercise)  76 %    Oxygen Saturation (Exit)  93 %    Rating  of Perceived Exertion (Exercise)  18    Perceived Dyspnea (Exercise)  4    Symptoms  SOB, weakness    Comments  walk test results       Functional Capacity: 6 Minute Walk    Row Name 03/09/18 1523         6 Minute Walk   Phase  Initial     Distance  365 feet     Walk Time  2 minutes     # of Rest Breaks  0     MPH  2.07     METS  2.59     RPE  18     Perceived Dyspnea   4     VO2 Peak  9.05     Symptoms  Yes (comment)     Comments  SOB, weakness     Resting HR  110 bpm     Resting BP  126/64     Resting Oxygen Saturation   94 %     Exercise Oxygen Saturation  during 6 min walk  76 %     Max Ex. HR  126 bpm     Max Ex. BP  134/84     2 Minute Post BP  126/66       Interval HR   1 Minute HR  123     2 Minute HR  126     2 Minute Post HR  116     Interval Heart Rate?  Yes       Interval Oxygen   Interval Oxygen?  Yes     Baseline Oxygen Saturation %  94 %     1 Minute Oxygen Saturation %  85 %     1 Minute Liters of Oxygen  2 L pulsed     2 Minute Oxygen Saturation %  76 % test terminated at 2 min     2 Minute Liters of Oxygen  2 L     2 Minute Post Oxygen Saturation %  93 %     2 Minute Post Liters of Oxygen  2 L        Psychological, QOL, Others - Outcomes: PHQ 2/9: Depression screen [facility] 2/9 03/09/2018 02/06/2018 08/18/2017 07/07/2017 02/05/2017  Decreased Interest 2 1 1 3 1   Down, Depressed, Hopeless 3 3 0 2 1  PHQ - 2 Score 5 4 1 5 2   Altered sleeping 3 1 1 3 1   Tired, decreased energy 3 3 1 3 2   Change in appetite 2 1  0 2 0  Feeling bad or failure about yourself  1 1 0 0 0  Trouble concentrating 1 0 1 1 0  Moving slowly or fidgety/restless 1 0 0 1 0  Suicidal thoughts 0 0 0 0 0  PHQ-9 Score 16 10 4 15 5   Difficult doing work/chores Extremely dIfficult Very difficult - - Somewhat difficult    Quality of Life:   Personal Goals: Goals established at orientation with interventions provided to work toward goal. Personal Goals and Risk Factors at Admission - 03/09/18 1519      Core Components/Risk Factors/Patient Goals on Admission    Weight Management  Yes;Weight Maintenance;Weight Gain    Intervention  Weight Management: Develop a combined nutrition and exercise program designed to reach desired caloric intake, while maintaining appropriate intake of nutrient and fiber, sodium and fats, and appropriate energy expenditure required for the weight goal.;Weight Management: Provide education and appropriate resources to help participant  work on and attain dietary goals.    Admit Weight  111 lb 11.2 oz (50.7 kg)    Goal Weight: Short Term  120 lb (54.4 kg)    Goal Weight: Long Term  120 lb (54.4 kg)    Expected Outcomes  Weight Gain: Understanding of general recommendations for a high calorie, high protein meal plan that promotes weight gain by distributing calorie intake throughout the day with the consumption for 4-5 meals, snacks, and/or supplements;Short Term: Continue to assess and modify interventions until short term weight is achieved;Long Term: Adherence to nutrition and physical activity/exercise program aimed toward attainment of established weight goal;Understanding recommendations for meals to include 15-35% energy as protein, 25-35% energy from fat, 35-60% energy from carbohydrates, less than 200mg  of dietary cholesterol, 20-35 gm of total fiber daily;Understanding of distribution of calorie intake throughout the day with the consumption of 4-5 meals/snacks    Tobacco Cessation  Yes    Intervention   Assist the participant in steps to quit. Provide individualized education and counseling about committing to Tobacco Cessation, relapse prevention, and pharmacological support that can be provided by physician.;Advice worker, assist with locating and accessing local/national Quit Smoking programs, and support quit date choice.    Expected Outcomes  Short Term: Will demonstrate readiness to quit, by selecting a quit date.;Short Term: Will quit all tobacco product use, adhering to prevention of relapse plan.;Long Term: Complete abstinence from all tobacco products for at least 12 months from quit date.    Improve shortness of breath with ADL's  Yes    Intervention  Provide education, individualized exercise plan and daily activity instruction to help decrease symptoms of SOB with activities of daily living.    Expected Outcomes  Short Term: Improve cardiorespiratory fitness to achieve a reduction of symptoms when performing ADLs;Long Term: Be able to perform more ADLs without symptoms or delay the onset of symptoms    Lipids  Yes    Intervention  Provide education and support for participant on nutrition & aerobic/resistive exercise along with prescribed medications to achieve LDL 70mg , HDL >40mg .   she reports higher lipids.   Expected Outcomes  Short Term: Participant states understanding of desired cholesterol values and is compliant with medications prescribed. Participant is following exercise prescription and nutrition guidelines.;Long Term: Cholesterol controlled with medications as prescribed, with individualized exercise RX and with personalized nutrition plan. Value goals: LDL < 70mg , HDL > 40 mg.        Personal Goals Discharge:   Exercise Goals and Review: Exercise Goals    Row Name 03/09/18 1532             Exercise Goals   Increase Physical Activity  Yes       Intervention  Provide advice, education, support and counseling about physical activity/exercise  needs.;Develop an individualized exercise prescription for aerobic and resistive training based on initial evaluation findings, risk stratification, comorbidities and participant's personal goals.       Expected Outcomes  Short Term: Attend rehab on a regular basis to increase amount of physical activity.;Long Term: Add in home exercise to make exercise part of routine and to increase amount of physical activity.;Long Term: Exercising regularly at least 3-5 days a week.       Increase Strength and Stamina  Yes       Intervention  Provide advice, education, support and counseling about physical activity/exercise needs.;Develop an individualized exercise prescription for aerobic and resistive training based on initial evaluation findings, risk stratification,  comorbidities and participant's personal goals.       Expected Outcomes  Short Term: Increase workloads from initial exercise prescription for resistance, speed, and METs.;Long Term: Improve cardiorespiratory fitness, muscular endurance and strength as measured by increased METs and functional capacity (6MWT);Short Term: Perform resistance training exercises routinely during rehab and add in resistance training at home       Able to understand and use rate of perceived exertion (RPE) scale  Yes       Intervention  Provide education and explanation on how to use RPE scale       Expected Outcomes  Short Term: Able to use RPE daily in rehab to express subjective intensity level;Long Term:  Able to use RPE to guide intensity level when exercising independently       Able to understand and use Dyspnea scale  Yes       Intervention  Provide education and explanation on how to use Dyspnea scale       Expected Outcomes  Short Term: Able to use Dyspnea scale daily in rehab to express subjective sense of shortness of breath during exertion;Long Term: Able to use Dyspnea scale to guide intensity level when exercising independently       Knowledge and  understanding of Target Heart Rate Range (THRR)  Yes       Intervention  Provide education and explanation of THRR including how the numbers were predicted and where they are located for reference       Expected Outcomes  Short Term: Able to state/look up THRR;Short Term: Able to use daily as guideline for intensity in rehab;Long Term: Able to use THRR to govern intensity when exercising independently       Able to check pulse independently  Yes       Intervention  Provide education and demonstration on how to check pulse in carotid and radial arteries.;Review the importance of being able to check your own pulse for safety during independent exercise       Expected Outcomes  Short Term: Able to explain why pulse checking is important during independent exercise;Long Term: Able to check pulse independently and accurately       Understanding of Exercise Prescription  Yes       Intervention  Provide education, explanation, and written materials on patient's individual exercise prescription       Expected Outcomes  Short Term: Able to explain program exercise prescription;Long Term: Able to explain home exercise prescription to exercise independently          Exercise Goals Re-Evaluation:   Nutrition & Weight - Outcomes: Pre Biometrics - 03/09/18 1532      Pre Biometrics   Height  5' 4.8" (1.646 m)    Weight  111 lb 11.2 oz (50.7 kg)    Waist Circumference  30 inches    Hip Circumference  36 inches    Waist to Hip Ratio  0.83 %    BMI (Calculated)  18.7    Single Leg Stand  2.56 seconds        Nutrition: Nutrition Therapy & Goals - 03/09/18 1518      Personal Nutrition Goals   Nutrition Goal  She wants to learn to eat better    Personal Goal #2  She wants to try to gain a little weight    Comments  She states her diet is not good and needs to make a change      Intervention Plan   Intervention  Prescribe, educate and counsel regarding individualized specific dietary modifications  aiming towards targeted core components such as weight, hypertension, lipid management, diabetes, heart failure and other comorbidities.;Nutrition handout(s) given to patient.    Expected Outcomes  Short Term Goal: Understand basic principles of dietary content, such as calories, fat, sodium, cholesterol and nutrients.;Long Term Goal: Adherence to prescribed nutrition plan.       Nutrition Discharge: Nutrition Assessments - 03/09/18 1515      MEDFICTS Scores   Pre Score  102       Education Questionnaire Score: Knowledge Questionnaire Score - 03/09/18 1515      Knowledge Questionnaire Score   Pre Score  15/18   reviewed with patient       Goals reviewed with patient; copy given to patient.

## 2018-05-01 ENCOUNTER — Ambulatory Visit: Payer: Self-pay

## 2018-05-04 ENCOUNTER — Ambulatory Visit: Payer: Self-pay

## 2018-05-06 ENCOUNTER — Telehealth: Payer: Self-pay

## 2018-05-06 ENCOUNTER — Ambulatory Visit: Payer: Self-pay

## 2018-05-06 NOTE — Telephone Encounter (Signed)
Patient reports the following symptoms:  Constipation X 1 week prior to last week *states she did not take anything for constipation other than her daily linzess  Diarrhea every day X 1 week Weak No energy  No appetite States she does drink plenty of fluids No N/V Mild stomach pain with constipation but no pain at the present  No fever, chills or body aches No CP/ NO Sob Denies any melena  Patient given 19min appt tomorrow with Dr. Darnell Level.  She knows if symptoms worsen or progressing concerns to call back or go to urgent care.   She verbalizes understanding.

## 2018-05-07 ENCOUNTER — Ambulatory Visit: Payer: Self-pay | Admitting: Family Medicine

## 2018-05-07 NOTE — Progress Notes (Deleted)
There were no vitals taken for this visit.   CC: diarrhea Subjective:    Patient ID: Christine Serrano, female    DOB: November 28, 1960, 57 y.o.   MRN: [id number]  HPI: Christine Serrano is a 57 y.o. female presenting on 05/07/2018 for No chief complaint on file.   ***  Relevant past medical, surgical, family and social history reviewed and updated as indicated. Interim medical history since our last visit reviewed. Allergies and medications reviewed and updated. Outpatient Medications Prior to Visit  Medication Sig Dispense Refill  . aspirin EC 81 MG tablet Take 81 mg by mouth daily.    . benzonatate (TESSALON) 100 MG capsule Take 1 capsule (100 mg total) by mouth 2 (two) times daily as needed for cough. 30 capsule 3  . Cholecalciferol (VITAMIN D) 2000 units tablet Take 2,000 Units by mouth daily.    . clotrimazole (LOTRIMIN) 1 % cream Apply 1 application topically 2 (two) times daily. For 2 weeks to rash 60 g 0  . fluticasone (FLONASE) 50 MCG/ACT nasal spray Place 2 sprays into both nostrils daily. 16 g 3  . gabapentin (NEURONTIN) 100 MG capsule Take one tablet nightly for 4 days then twice daily for 4 days then three times daily (Patient taking differently: Take 100 mg by mouth 3 (three) times daily. ) 90 capsule 3  . hydrOXYzine (ATARAX/VISTARIL) 25 MG tablet Take 1 tablet (25 mg total) by mouth 2 (two) times daily as needed for anxiety. 180 tablet 0  . ipratropium-albuterol (DUONEB) 0.5-2.5 (3) MG/3ML SOLN Inhale 3 mLs into the lungs every 6 (six) hours as needed. 360 mL 3  . LINZESS 290 MCG CAPS capsule Take 1 capsule (290 mcg total) by mouth daily. 90 capsule 3  . Multiple Vitamins-Minerals (MULTIVITAMIN ADULT) TABS Take 1 tablet by mouth daily.    . Na Sulfate-K Sulfate-Mg Sulf (SUPREP BOWEL PREP KIT) 17.5-3.13-1.6 GM/177ML SOLN Take 1 kit by mouth as directed. 324 mL 0  . naproxen (NAPROSYN) 375 MG tablet Take 1 tablet (375 mg total) by mouth 2 (two) times daily as needed for moderate  pain. 40 tablet 1  . OXYGEN Inhale 3 L/min into the lungs continuous.    . sertraline (ZOLOFT) 100 MG tablet Take 1.5 tablets (150 mg total) by mouth daily. 135 tablet 3  . SYMBICORT 160-4.5 MCG/ACT inhaler Inhale 2 puffs into the lungs 2 (two) times daily. 3 Inhaler 3  . terbinafine (LAMISIL) 250 MG tablet Take 1 tablet (250 mg total) by mouth daily. 14 tablet 0  . tiotropium (SPIRIVA) 18 MCG inhalation capsule Place 1 capsule (18 mcg total) into inhaler and inhale daily. 90 capsule 4  . traZODone (DESYREL) 50 MG tablet Take 0.5 tablets (25 mg total) by mouth at bedtime. 90 tablet 1  . VENTOLIN HFA 108 (90 Base) MCG/ACT inhaler Inhale 2 puffs into the lungs every 6 (six) hours as needed for wheezing or shortness of breath. 54 g 1  . vitamin B-12 (CYANOCOBALAMIN) 1000 MCG tablet Take 1,000 mcg by mouth daily.    . vitamin E 400 UNIT capsule Take 400 Units by mouth daily.      No facility-administered medications prior to visit.      Per HPI unless specifically indicated in ROS section below Review of Systems     Objective:    There were no vitals taken for this visit.  Wt Readings from Last 3 Encounters:  04/24/18 111 lb 12 oz (50.7 kg)  03/17/18 113 lb 4  oz (51.4 kg)  03/09/18 111 lb 11.2 oz (50.7 kg)    Physical Exam Results for orders placed or performed in visit on 02/06/18  CBC with Differential/Platelet  Result Value Ref Range   WBC 6.3 4.0 - 10.5 K/uL   RBC 4.60 3.87 - 5.11 Mil/uL   Hemoglobin 13.2 12.0 - 15.0 g/dL   HCT 39.3 36.0 - 46.0 %   MCV 85.5 78.0 - 100.0 fl   MCHC 33.5 30.0 - 36.0 g/dL   RDW 15.0 11.5 - 15.5 %   Platelets 204.0 150.0 - 400.0 K/uL   Neutrophils Relative % 53.1 43.0 - 77.0 %   Lymphocytes Relative 36.5 12.0 - 46.0 %   Monocytes Relative 6.8 3.0 - 12.0 %   Eosinophils Relative 3.2 0.0 - 5.0 %   Basophils Relative 0.4 0.0 - 3.0 %   Neutro Abs 3.4 1.4 - 7.7 K/uL   Lymphs Abs 2.3 0.7 - 4.0 K/uL   Monocytes Absolute 0.4 0.1 - 1.0 K/uL    Eosinophils Absolute 0.2 0.0 - 0.7 K/uL   Basophils Absolute 0.0 0.0 - 0.1 K/uL  TSH  Result Value Ref Range   TSH 1.20 0.35 - 4.50 uIU/mL  Comprehensive metabolic panel  Result Value Ref Range   Sodium 139 135 - 145 mEq/L   Potassium 4.2 3.5 - 5.1 mEq/L   Chloride 99 96 - 112 mEq/L   CO2 35 (H) 19 - 32 mEq/L   Glucose, Bld 89 70 - 99 mg/dL   BUN 9 6 - 23 mg/dL   Creatinine, Ser 0.66 0.40 - 1.20 mg/dL   Total Bilirubin 0.4 0.2 - 1.2 mg/dL   Alkaline Phosphatase 72 39 - 117 U/L   AST 15 0 - 37 U/L   ALT 7 0 - 35 U/L   Total Protein 6.7 6.0 - 8.3 g/dL   Albumin 4.1 3.5 - 5.2 g/dL   Calcium 9.9 8.4 - 10.5 mg/dL   GFR 97.40 >60.00 mL/min  Lipid panel  Result Value Ref Range   Cholesterol 181 0 - 200 mg/dL   Triglycerides 153.0 (H) 0.0 - 149.0 mg/dL   HDL 45.30 >39.00 mg/dL   VLDL 30.6 0.0 - 40.0 mg/dL   LDL Cholesterol 105 (H) 0 - 99 mg/dL   Total CHOL/HDL Ratio 4    NonHDL 135.96       Assessment & Plan:   Problem List Items Addressed This Visit    None       No orders of the defined types were placed in this encounter.  No orders of the defined types were placed in this encounter.   Follow up plan: No follow-ups on file.  Ria Bush, MD

## 2018-05-08 ENCOUNTER — Ambulatory Visit: Payer: Self-pay

## 2018-05-08 ENCOUNTER — Ambulatory Visit (INDEPENDENT_AMBULATORY_CARE_PROVIDER_SITE_OTHER): Payer: Medicare HMO | Admitting: Family Medicine

## 2018-05-08 ENCOUNTER — Encounter: Payer: Self-pay | Admitting: Family Medicine

## 2018-05-08 VITALS — BP 118/70 | HR 94 | Temp 98.2°F | Ht 64.5 in | Wt 112.8 lb

## 2018-05-08 DIAGNOSIS — R42 Dizziness and giddiness: Secondary | ICD-10-CM

## 2018-05-08 DIAGNOSIS — F331 Major depressive disorder, recurrent, moderate: Secondary | ICD-10-CM

## 2018-05-08 DIAGNOSIS — B354 Tinea corporis: Secondary | ICD-10-CM | POA: Diagnosis not present

## 2018-05-08 DIAGNOSIS — R197 Diarrhea, unspecified: Secondary | ICD-10-CM | POA: Insufficient documentation

## 2018-05-08 NOTE — Assessment & Plan Note (Signed)
Ongoing struggle - anticipate malaise from diarrheal illness contributing. I did ask she return in 6 wks for f/u visit.

## 2018-05-08 NOTE — Patient Instructions (Addendum)
?  terbinafine side effects.  Pass by lab to pick up stool test for infection.  We will be in touch with results. In the meantime, focus on good fluid intake - with water, ginger ale, gatorade, chicken broth etc. Also stick to bland diet until diarrhea is getting better.

## 2018-05-08 NOTE — Telephone Encounter (Signed)
ERROR

## 2018-05-08 NOTE — Progress Notes (Signed)
BP 118/70 (BP Location: Left Arm, Patient Position: Sitting, Cuff Size: Normal)   Pulse 94   Temp 98.2 F (36.8 C) (Oral)   Ht 5' 4.5" (1.638 m)   Wt 112 lb 12 oz (51.1 kg)   SpO2 95% Comment: 2 L, pulsating  BMI 19.05 kg/m   Orthostatic VS for the past 24 hrs (Last 3 readings):  BP- Lying BP- Standing at 0 minutes  05/08/18 1223 - 114/62  05/08/18 1221 132/90 -    CC: diarrhea Subjective:    Patient ID: Christine Serrano, female    DOB: 11-19-1960, 57 y.o.   MRN: [id number]  HPI: Christine Serrano is a 57 y.o. female presenting on 05/08/2018 for Diarrhea (C/o diarrhea for about 1 wk. Also, c/o fatigue, dizziness and loss of appetite. )   10d h/o watery diarrhea at least 4 times a day. This is associated with abdominal cramping that improves with bowel movement. No h/o IBS. Known chronic idiopathic constipation (she has been holding linzess).   No fevers/chills, blood or mucous in stool, nausea/vomiting.  No sick contacts at home.   No recent antibiotics.  No recent travel.  Uses well water, drinking water is filtered.  She just completed treatment for tinea with terbinafine 2 wk course.   Some struggle with mood, denies SI/HI. Insomnia has improved with trazodone.   Relevant past medical, surgical, family and social history reviewed and updated as indicated. Interim medical history since our last visit reviewed. Allergies and medications reviewed and updated. Outpatient Medications Prior to Visit  Medication Sig Dispense Refill  . aspirin EC 81 MG tablet Take 81 mg by mouth daily.    . benzonatate (TESSALON) 100 MG capsule Take 1 capsule (100 mg total) by mouth 2 (two) times daily as needed for cough. 30 capsule 3  . Cholecalciferol (VITAMIN D) 2000 units tablet Take 2,000 Units by mouth daily.    . clotrimazole (LOTRIMIN) 1 % cream Apply 1 application topically 2 (two) times daily. For 2 weeks to rash 60 g 0  . fluticasone (FLONASE) 50 MCG/ACT nasal spray Place 2 sprays  into both nostrils daily. 16 g 3  . gabapentin (NEURONTIN) 100 MG capsule Take one tablet nightly for 4 days then twice daily for 4 days then three times daily (Patient taking differently: Take 100 mg by mouth 3 (three) times daily. ) 90 capsule 3  . hydrOXYzine (ATARAX/VISTARIL) 25 MG tablet Take 1 tablet (25 mg total) by mouth 2 (two) times daily as needed for anxiety. 180 tablet 0  . ipratropium-albuterol (DUONEB) 0.5-2.5 (3) MG/3ML SOLN Inhale 3 mLs into the lungs every 6 (six) hours as needed. 360 mL 3  . LINZESS 290 MCG CAPS capsule Take 1 capsule (290 mcg total) by mouth daily. 90 capsule 3  . Multiple Vitamins-Minerals (MULTIVITAMIN ADULT) TABS Take 1 tablet by mouth daily.    . Na Sulfate-K Sulfate-Mg Sulf (SUPREP BOWEL PREP KIT) 17.5-3.13-1.6 GM/177ML SOLN Take 1 kit by mouth as directed. 324 mL 0  . naproxen (NAPROSYN) 375 MG tablet Take 1 tablet (375 mg total) by mouth 2 (two) times daily as needed for moderate pain. 40 tablet 1  . OXYGEN Inhale 3 L/min into the lungs continuous.    . sertraline (ZOLOFT) 100 MG tablet Take 1.5 tablets (150 mg total) by mouth daily. 135 tablet 3  . SYMBICORT 160-4.5 MCG/ACT inhaler Inhale 2 puffs into the lungs 2 (two) times daily. 3 Inhaler 3  . terbinafine (LAMISIL) 250 MG tablet  Take 1 tablet (250 mg total) by mouth daily. 14 tablet 0  . tiotropium (SPIRIVA) 18 MCG inhalation capsule Place 1 capsule (18 mcg total) into inhaler and inhale daily. 90 capsule 4  . traZODone (DESYREL) 50 MG tablet Take 0.5 tablets (25 mg total) by mouth at bedtime. 90 tablet 1  . VENTOLIN HFA 108 (90 Base) MCG/ACT inhaler Inhale 2 puffs into the lungs every 6 (six) hours as needed for wheezing or shortness of breath. 54 g 1  . vitamin B-12 (CYANOCOBALAMIN) 1000 MCG tablet Take 1,000 mcg by mouth daily.    . vitamin E 400 UNIT capsule Take 400 Units by mouth daily.      No facility-administered medications prior to visit.      Per HPI unless specifically indicated in ROS  section below Review of Systems     Objective:    BP 118/70 (BP Location: Left Arm, Patient Position: Sitting, Cuff Size: Normal)   Pulse 94   Temp 98.2 F (36.8 C) (Oral)   Ht 5' 4.5" (1.638 m)   Wt 112 lb 12 oz (51.1 kg)   SpO2 95% Comment: 2 L, pulsating  BMI 19.05 kg/m   Wt Readings from Last 3 Encounters:  05/08/18 112 lb 12 oz (51.1 kg)  04/24/18 111 lb 12 oz (50.7 kg)  03/17/18 113 lb 4 oz (51.4 kg)    Physical Exam  Constitutional: She appears well-developed and well-nourished. No distress.  HENT:  Head: Normocephalic and atraumatic.  Mouth/Throat: Oropharynx is clear and moist. No oropharyngeal exudate.  Eyes: Pupils are equal, round, and reactive to light. Conjunctivae and EOM are normal.  Cardiovascular: Normal rate, regular rhythm and normal heart sounds.  No murmur heard. Pulmonary/Chest: Effort normal and breath sounds normal. No respiratory distress. She has no wheezes. She has no rales.  Abdominal: Soft. Bowel sounds are normal. She exhibits no distension and no mass. There is no tenderness. There is no rebound and no guarding. No hernia.  Musculoskeletal: She exhibits no edema.  Nursing note and vitals reviewed.     Assessment & Plan:   Problem List Items Addressed This Visit    Tinea corporis    Resolved with terbinafine treatment. See below.       Moderate recurrent major depression ([place])    Ongoing struggle - anticipate malaise from diarrheal illness contributing. I did ask she return in 6 wks for f/u visit.       Diarrhea - Primary    Ongoing illness for 10 days. In setting of recent terbinafine use - ?side effect of antifungal - should improve now that regimen is completed.  Will check GI pathogen panel r/o infectious cause such as C diff, but hold on empiric treatment for now.  Evidence of some dehydration with positive orthostatics today. Supportive care reviewed - ensuring good hydration, liquid and bland diet reviewed. Update if not improving.   Avoid imodium in h/o chronic constipation.      Relevant Orders   Gastrointestinal Pathogen Panel PCR    Other Visit Diagnoses    Orthostatic dizziness           No orders of the defined types were placed in this encounter.  Orders Placed This Encounter  Procedures  . Gastrointestinal Pathogen Panel PCR    Follow up plan: Return in about 6 weeks (around 06/19/2018) for follow up visit.  Ria Bush, MD

## 2018-05-08 NOTE — Assessment & Plan Note (Addendum)
Resolved with terbinafine treatment. See below.

## 2018-05-08 NOTE — Assessment & Plan Note (Signed)
Ongoing illness for 10 days. In setting of recent terbinafine use - ?side effect of antifungal - should improve now that regimen is completed.  Will check GI pathogen panel r/o infectious cause such as C diff, but hold on empiric treatment for now.  Evidence of some dehydration with positive orthostatics today. Supportive care reviewed - ensuring good hydration, liquid and bland diet reviewed. Update if not improving.  Avoid imodium in h/o chronic constipation.

## 2018-05-11 ENCOUNTER — Ambulatory Visit: Payer: Self-pay

## 2018-05-11 ENCOUNTER — Other Ambulatory Visit: Payer: Medicare HMO

## 2018-05-11 ENCOUNTER — Encounter: Payer: Self-pay | Admitting: Pulmonary Disease

## 2018-05-11 ENCOUNTER — Ambulatory Visit (INDEPENDENT_AMBULATORY_CARE_PROVIDER_SITE_OTHER): Payer: Medicare HMO | Admitting: Pulmonary Disease

## 2018-05-11 VITALS — BP 108/62 | HR 99 | Ht 64.5 in | Wt 118.6 lb

## 2018-05-11 DIAGNOSIS — Z23 Encounter for immunization: Secondary | ICD-10-CM

## 2018-05-11 DIAGNOSIS — J449 Chronic obstructive pulmonary disease, unspecified: Secondary | ICD-10-CM

## 2018-05-11 DIAGNOSIS — F17200 Nicotine dependence, unspecified, uncomplicated: Secondary | ICD-10-CM

## 2018-05-11 DIAGNOSIS — J9611 Chronic respiratory failure with hypoxia: Secondary | ICD-10-CM

## 2018-05-11 DIAGNOSIS — R198 Other specified symptoms and signs involving the digestive system and abdomen: Secondary | ICD-10-CM | POA: Diagnosis not present

## 2018-05-11 DIAGNOSIS — R197 Diarrhea, unspecified: Secondary | ICD-10-CM | POA: Diagnosis not present

## 2018-05-11 MED ORDER — PNEUMOCOCCAL 13-VAL CONJ VACC IM SUSP
0.5000 mL | Freq: Once | INTRAMUSCULAR | Status: AC
  Administered 2018-05-11: 0.5 mL via INTRAMUSCULAR

## 2018-05-11 NOTE — Addendum Note (Signed)
Addended by: Darreld Mclean on: 05/11/2018 05:00 PM   Modules accepted: Orders

## 2018-05-11 NOTE — Patient Instructions (Addendum)
We again discussed the imperative of quitting smoking altogether Pneumonia shot today Referral to pulmonary rehabilitation re-submitted Continue oxygen therapy is close to 24 hours/day as possible Continue Symbicort and Spiriva inhalers as maintenance medications Use DuoNeb or albuterol inhaler as needed only up to 4 times per day I recommended modest weight gain and focus on good nutrition I recommended a good multivitamin and vitamin D supplementation  If you can abstain from cigarettes for 1 month, I will make referral for lung transplantation  Follow-up in this office in 3 months.  Call sooner if needed

## 2018-05-11 NOTE — Progress Notes (Signed)
PULMONARY OFFICE FOLLOW UP  PT PROFILE: 46 F recalcitrant smoker with severe, oxygen-dependent COPD hospitalized 10/24-10/26/17 for COPD exacerbation initially requiring NIPPV  PROBLEMS: Recalcitrant smoker Chronic hypoxic respiratory failure Very severe COPD/emphysema Chronic bronchitis  DATA: CT chest 03/13/16: Severe emphysema with extensive disease in the upper lobes and superior segment of the right lower lobe. This could represent acute and/or chronic changes. In addition, there is an irregular spiculated density along the right minor fissure which is indeterminate. PFTs 01/15/18: FVC: 1.42 L (48 %pred), FEV1: 0.76 L (31 %pred), FEV1/FVC: 53% , TLC: 4.49 L (94 %pred), DLCO 30 %pred, DDLCO/VA 51%. TLC likely underestimated by gas dilution technique. Severe Gas trapping     INTERVAL:  Last see 01/23/2018. No major pulmonary events   SUBJ: This is a scheduled follow-up.  There have been no major events or problems since last visit.  She continues to work on smoking cessation.  She is presently smoking 2-3 cigarettes/day.  Her plan is to quit smoking by the end of this week.  She has not gone to pulmonary rehab due to transportation issues.  She requests a repeat referral for this as her transportation issues are now resolved.  She continues to describe moderate exertional limitation.  She believes that this is improved since she has cut way back on smoking.  She is maintained on Symbicort and Spiriva.  She also uses DuoNeb nebulized 3 times per day on a schedule (rather than as needed).  She is paying attention to nutrition and working on weight gain.  She denies CP, fever, purulent sputum, hemoptysis, LE edema and calf tenderness.  OBJ: Vitals:   05/11/18 0836 05/11/18 0840  BP:  108/62  Pulse:  99  SpO2:  93%  Weight: 118 lb 9.6 oz (53.8 kg)   Height: 5' 4.5" (1.638 m)   2 LPM   Gen: NAD, appears older than her true age 76: NCAT, sclera white Neck: No JVD Lungs: breath  sounds moderately diminished without wheezes or other adventitious sounds Cardiovascular: RRR, no murmurs Abdomen: Soft, nontender, normal BS Ext: without clubbing, cyanosis, edema Neuro: grossly intact Skin: Limited exam, no lesions noted  BMP Latest Ref Rng & Units 02/06/2018 02/05/2017 10/25/2016  Glucose 70 - 99 mg/dL 89 89 99  BUN 6 - 23 mg/dL 9 9 11   Creatinine 0.40 - 1.20 mg/dL 0.66 0.67 0.67  Sodium 135 - 145 mEq/L 139 137 139  Potassium 3.5 - 5.1 mEq/L 4.2 4.8 4.1  Chloride 96 - 112 mEq/L 99 98 100  CO2 19 - 32 mEq/L 35(H) 35(H) 35(H)  Calcium 8.4 - 10.5 mg/dL 9.9 10.3 9.3   CBC Latest Ref Rng & Units 02/06/2018 10/25/2016 04/10/2016  WBC 4.0 - 10.5 K/uL 6.3 3.8(L) 12.0(H)  Hemoglobin 12.0 - 15.0 g/dL 13.2 15.5(H) 12.0  Hematocrit 36.0 - 46.0 % 39.3 45.9 34.7(L)  Platelets 150.0 - 400.0 K/uL 204.0 138.0(L) 206    CXR: No new film  IMPRESSION: Smoker  Chronic respiratory failure with hypoxia (HCC)  COPD, very severe ([place]) - Plan: Pulmonary rehab therapeutic exercise  Need for pneumococcal vaccination - Plan: pneumococcal 13-valent conjugate vaccine (PREVNAR 13) injection 0.5 mL   We again discussed the imperative of smoking cessation We again discussed the option of lung transplantation if she is able to quit smoking  PLAN: We again discussed the imperative of smoking cessation Pneumonia shot administered today Referral to pulmonary rehabilitation re-submitted Continue oxygen therapy is close to 24 hours/day as possible Continue Symbicort and Spiriva  inhalers as maintenance medications Use DuoNeb or albuterol inhaler as needed only up to 4 times per day I recommended modest weight gain and focus on good nutrition I recommended a good multivitamin and vitamin D supplementation  If she can abstain from cigarettes for 1 month, I will make referral for lung transplantation  Follow-up in this office in 3 months.  Call sooner if needed  Merton Border, MD PCCM  service Mobile [phone] Pager [phone] 05/11/2018 12:13 PM

## 2018-05-11 NOTE — Progress Notes (Signed)
PULMONARY OFFICE FOLLOW UP  PT PROFILE: 13 F recalcitrant smoker with severe, oxygen-dependent COPD hospitalized 10/24-10/26/17 for COPD exacerbation initially requiring NIPPV  PROBLEMS: Recalcitrant smoker Chronic hypoxic respiratory failure Very severe COPD/emphysema Chronic bronchitis  DATA: CT chest 03/13/16: Severe emphysema with extensive disease in the upper lobes and superior segment of the right lower lobe. This could represent acute and/or chronic changes. In addition, there is an irregular spiculated density along the right minor fissure which is indeterminate. PFTs 01/15/18: FVC: 1.42 L (48 %pred), FEV1: 0.76 L (31 %pred), FEV1/FVC: 53% , TLC: 4.49 L (94 %pred), DLCO 30 %pred, DDLCO/VA 51%. TLC likely underestimated by gas dilution technique. Severe Gas trapping     INTERVAL:  Last see 01/23/2018. No major pulmonary events   SUBJ: This is a scheduled follow-up.  There have been no major events or problems since last visit.  She continues to work on smoking cessation.  She is presently smoking 2-3 cigarettes/day.  Her plan is to quit smoking by the end of this week.  She has not gone to pulmonary rehab due to transportation issues.  She requests a repeat referral for this as her transportation issues are now resolved.  She continues to describe moderate exertional limitation.  She believes that this is improved since she has cut way back on smoking.  She is maintained on Symbicort and Spiriva.  She also uses DuoNeb nebulized 3 times per day on a schedule (rather than as needed).  She is paying attention to nutrition and working on weight gain.  She denies CP, fever, purulent sputum, hemoptysis, LE edema and calf tenderness.  OBJ: Vitals:   05/11/18 0836 05/11/18 0840  BP:  108/62  Pulse:  99  SpO2:  93%  Weight: 118 lb 9.6 oz (53.8 kg)   Height: 5' 4.5" (1.638 m)   2 LPM   Gen: NAD, appears older than her true age 57: NCAT, sclera white Neck: No JVD Lungs: breath  sounds moderately diminished without wheezes or other adventitious sounds Cardiovascular: RRR, no murmurs Abdomen: Soft, nontender, normal BS Ext: without clubbing, cyanosis, edema Neuro: grossly intact Skin: Limited exam, no lesions noted  BMP Latest Ref Rng & Units 02/06/2018 02/05/2017 10/25/2016  Glucose 70 - 99 mg/dL 89 89 99  BUN 6 - 23 mg/dL 9 9 11   Creatinine 0.40 - 1.20 mg/dL 0.66 0.67 0.67  Sodium 135 - 145 mEq/L 139 137 139  Potassium 3.5 - 5.1 mEq/L 4.2 4.8 4.1  Chloride 96 - 112 mEq/L 99 98 100  CO2 19 - 32 mEq/L 35(H) 35(H) 35(H)  Calcium 8.4 - 10.5 mg/dL 9.9 10.3 9.3   CBC Latest Ref Rng & Units 02/06/2018 10/25/2016 04/10/2016  WBC 4.0 - 10.5 K/uL 6.3 3.8(L) 12.0(H)  Hemoglobin 12.0 - 15.0 g/dL 13.2 15.5(H) 12.0  Hematocrit 36.0 - 46.0 % 39.3 45.9 34.7(L)  Platelets 150.0 - 400.0 K/uL 204.0 138.0(L) 206    CXR: No new film  IMPRESSION:   ICD-10-CM   1. Smoker F17.200   2. Chronic respiratory failure with hypoxia (HCC) J96.11   3. COPD, very severe ([place]) J44.9 Pulmonary rehab therapeutic exercise  4. Need for pneumococcal vaccination Z23 pneumococcal 13-valent conjugate vaccine (PREVNAR 13) injection 0.5 mL   We again discussed the imperative of smoking cessation We again discussed the option of lung transplantation if she is able to quit smoking  PLAN: We again discussed the imperative of smoking cessation Pneumonia shot administered today Referral to pulmonary rehabilitation re-submitted Continue oxygen therapy  is close to 24 hours/day as possible Continue Symbicort and Spiriva inhalers as maintenance medications Use DuoNeb or albuterol inhaler as needed only up to 4 times per day I recommended modest weight gain and focus on good nutrition I recommended a good multivitamin and vitamin D supplementation  If she can abstain from cigarettes for 1 month, I will make referral for lung transplantation  Follow-up in this office in 3 months.  Call sooner if  needed  Merton Border, MD PCCM service Mobile [phone] Pager [phone] 05/11/2018 9:23 AM

## 2018-05-12 LAB — GASTROINTESTINAL PATHOGEN PANEL PCR
C. DIFFICILE TOX A/B, PCR: NOT DETECTED
CAMPYLOBACTER, PCR: NOT DETECTED
CRYPTOSPORIDIUM, PCR: NOT DETECTED
E COLI (ETEC) LT/ST, PCR: NOT DETECTED
E COLI 0157, PCR: NOT DETECTED
E coli (STEC) stx1/stx2, PCR: NOT DETECTED
Giardia lamblia, PCR: NOT DETECTED
Norovirus, PCR: NOT DETECTED
Rotavirus A, PCR: NOT DETECTED
Salmonella, PCR: NOT DETECTED
Shigella, PCR: NOT DETECTED

## 2018-05-13 ENCOUNTER — Ambulatory Visit: Payer: Self-pay

## 2018-05-15 ENCOUNTER — Ambulatory Visit: Payer: Self-pay

## 2018-05-18 ENCOUNTER — Ambulatory Visit: Payer: Self-pay

## 2018-05-19 ENCOUNTER — Telehealth: Payer: Self-pay

## 2018-05-19 ENCOUNTER — Ambulatory Visit
Admission: RE | Admit: 2018-05-19 | Discharge: 2018-05-19 | Disposition: A | Discharge status: Home / Self Care | Payer: Medicare HMO | Source: Ambulatory Visit | Attending: Family Medicine | Admitting: Family Medicine

## 2018-05-19 DIAGNOSIS — Z1231 Encounter for screening mammogram for malignant neoplasm of breast: Secondary | ICD-10-CM | POA: Insufficient documentation

## 2018-05-19 DIAGNOSIS — M810 Age-related osteoporosis without current pathological fracture: Secondary | ICD-10-CM | POA: Diagnosis not present

## 2018-05-19 DIAGNOSIS — Z13820 Encounter for screening for osteoporosis: Secondary | ICD-10-CM | POA: Insufficient documentation

## 2018-05-19 DIAGNOSIS — Z1239 Encounter for other screening for malignant neoplasm of breast: Secondary | ICD-10-CM

## 2018-05-19 DIAGNOSIS — Z9189 Other specified personal risk factors, not elsewhere classified: Secondary | ICD-10-CM

## 2018-05-19 LAB — HM MAMMOGRAPHY

## 2018-05-19 NOTE — Telephone Encounter (Signed)
Pt returned your call Best number [phone]

## 2018-05-19 NOTE — Telephone Encounter (Addendum)
Left message on vm for pt to call back. Need to relay results and message per Dr. Darnell Level.   Results   Stool test for infection returned reassuringly normal. How is diarrhea? If improving, may have been side effect to terbinafine. If ongoing, would consider empiric antibiotic treatment. Let me know.

## 2018-05-20 ENCOUNTER — Ambulatory Visit: Payer: Self-pay

## 2018-05-20 ENCOUNTER — Encounter: Payer: Self-pay | Admitting: Family Medicine

## 2018-05-20 NOTE — Telephone Encounter (Signed)
Spoke with pt relaying results.  Verbalizes understanding.  

## 2018-05-21 DIAGNOSIS — J449 Chronic obstructive pulmonary disease, unspecified: Secondary | ICD-10-CM | POA: Diagnosis not present

## 2018-05-22 ENCOUNTER — Ambulatory Visit: Payer: Self-pay

## 2018-05-23 ENCOUNTER — Encounter: Payer: Self-pay | Admitting: Family Medicine

## 2018-05-23 DIAGNOSIS — M810 Age-related osteoporosis without current pathological fracture: Secondary | ICD-10-CM | POA: Insufficient documentation

## 2018-05-25 ENCOUNTER — Ambulatory Visit: Payer: Self-pay

## 2018-05-27 ENCOUNTER — Ambulatory Visit: Payer: Self-pay

## 2018-05-29 ENCOUNTER — Ambulatory Visit: Payer: Self-pay

## 2018-05-31 NOTE — Progress Notes (Addendum)
BP 120/68 (BP Location: Left Arm, Patient Position: Sitting, Cuff Size: Normal)   Pulse (!) 104   Temp 98.2 F (36.8 C) (Oral)   Ht 5' 4.5" (1.638 m)   Wt 115 lb 12 oz (52.5 kg)   SpO2 95% Comment: 2 L, pulsating  BMI 19.56 kg/m    CC: osteoporosis treatment Subjective:    Patient ID: Christine Serrano, female    DOB: [birth year], 59 y.o.   MRN: [id number]  HPI: Christine Serrano is a 57 y.o. female presenting on 06/01/2018 for Osteoporsis (Here to discuss tx options for osteoporsis. )   Recently found to have osteoporosis on routine DEXA: 05/2018 - T -3.5 hip, -2.6 spine osteoporosis.   Regularly takes vitamin D 2000 IU BID, doesn't take calcium.  H/o alcohol abuse.  Severe COPD, O2 dependent.  Current some day smoker for 45+ yrs, down to 3-4 cigarettes/day. Motivated to quit.   H/o R salpingo oophorectomy.  L ovary remains.  Has dentures.  Does not regularly need prednisone.   She enjoys riding horses - advised caution, and recommended against this.   Relevant past medical, surgical, family and social history reviewed and updated as indicated. Interim medical history since our last visit reviewed. Allergies and medications reviewed and updated. Outpatient Medications Prior to Visit  Medication Sig Dispense Refill  . aspirin EC 81 MG tablet Take 81 mg by mouth daily.    . benzonatate (TESSALON) 100 MG capsule Take 1 capsule (100 mg total) by mouth 2 (two) times daily as needed for cough. 30 capsule 3  . Cholecalciferol (VITAMIN D) 2000 units tablet Take 2,000 Units by mouth daily.    . clotrimazole (LOTRIMIN) 1 % cream Apply 1 application topically 2 (two) times daily. For 2 weeks to rash 60 g 0  . fluticasone (FLONASE) 50 MCG/ACT nasal spray Place 2 sprays into both nostrils daily. 16 g 3  . gabapentin (NEURONTIN) 100 MG capsule Take one tablet nightly for 4 days then twice daily for 4 days then three times daily (Patient taking differently: Take 100 mg by mouth 3 (three)  times daily. ) 90 capsule 3  . hydrOXYzine (ATARAX/VISTARIL) 25 MG tablet Take 1 tablet (25 mg total) by mouth 2 (two) times daily as needed for anxiety. 180 tablet 0  . ipratropium-albuterol (DUONEB) 0.5-2.5 (3) MG/3ML SOLN Inhale 3 mLs into the lungs every 6 (six) hours as needed. 360 mL 3  . LINZESS 290 MCG CAPS capsule Take 1 capsule (290 mcg total) by mouth daily. 90 capsule 3  . Multiple Vitamins-Minerals (MULTIVITAMIN ADULT) TABS Take 1 tablet by mouth daily.    . Na Sulfate-K Sulfate-Mg Sulf (SUPREP BOWEL PREP KIT) 17.5-3.13-1.6 GM/177ML SOLN Take 1 kit by mouth as directed. 324 mL 0  . naproxen (NAPROSYN) 375 MG tablet Take 1 tablet (375 mg total) by mouth 2 (two) times daily as needed for moderate pain. 40 tablet 1  . OXYGEN Inhale 3 L/min into the lungs continuous.    . sertraline (ZOLOFT) 100 MG tablet Take 1.5 tablets (150 mg total) by mouth daily. 135 tablet 3  . SYMBICORT 160-4.5 MCG/ACT inhaler Inhale 2 puffs into the lungs 2 (two) times daily. 3 Inhaler 3  . tiotropium (SPIRIVA) 18 MCG inhalation capsule Place 1 capsule (18 mcg total) into inhaler and inhale daily. 90 capsule 4  . traZODone (DESYREL) 50 MG tablet Take 0.5 tablets (25 mg total) by mouth at bedtime. 90 tablet 1  . VENTOLIN HFA 108 (90 Base)  MCG/ACT inhaler Inhale 2 puffs into the lungs every 6 (six) hours as needed for wheezing or shortness of breath. 54 g 1  . vitamin B-12 (CYANOCOBALAMIN) 1000 MCG tablet Take 1,000 mcg by mouth daily.    . vitamin E 400 UNIT capsule Take 400 Units by mouth daily.     Marland Kitchen terbinafine (LAMISIL) 250 MG tablet Take 1 tablet (250 mg total) by mouth daily. 14 tablet 0   No facility-administered medications prior to visit.      Per HPI unless specifically indicated in ROS section below Review of Systems     Objective:    BP 120/68 (BP Location: Left Arm, Patient Position: Sitting, Cuff Size: Normal)   Pulse (!) 104   Temp 98.2 F (36.8 C) (Oral)   Ht 5' 4.5" (1.638 m)   Wt 115  lb 12 oz (52.5 kg)   SpO2 95% Comment: 2 L, pulsating  BMI 19.56 kg/m   Wt Readings from Last 3 Encounters:  06/01/18 115 lb 12 oz (52.5 kg)  05/11/18 118 lb 9.6 oz (53.8 kg)  05/08/18 112 lb 12 oz (51.1 kg)    Physical Exam Vitals signs and nursing note reviewed.  Constitutional:      General: She is not in acute distress.    Appearance: Normal appearance.  Neurological:     Mental Status: She is alert.    Results for orders placed or performed in visit on 05/20/18  HM MAMMOGRAPHY  Result Value Ref Range   HM Mammogram 0-4 Bi-Rad 0-4 Bi-Rad, Self Reported Normal      Assessment & Plan:  Over 25 minutes were spent face-to-face with the patient during this encounter and >50% of that time was spent on counseling and coordination of care  Problem List Items Addressed This Visit    Osteoporosis - Primary    Discussed diagnosis and treatment options. Reviewed calcium and vit D recommendations as well as regular weight bearing exercise as COPD allows. Discussed treatment options namely bisphosphonates and prolia. She is interested in prolia shots as she has trouble swallowing pills. Will ask [facility] to price out Prolia. Discussed possible risk of atypical fractures and osteonecrosis. Will need vit D and Ca checked prior to first shot.       Relevant Orders   VITAMIN D 25 Hydroxy (Vit-D Deficiency, Fractures)   Calcium       No orders of the defined types were placed in this encounter.  Orders Placed This Encounter  Procedures  . VITAMIN D 25 Hydroxy (Vit-D Deficiency, Fractures)    Standing Status:   Future    Standing Expiration Date:   06/05/2019  . Calcium    Standing Status:   Future    Standing Expiration Date:   06/05/2019    Follow up plan: No follow-ups on file.  Ria Bush, MD

## 2018-06-01 ENCOUNTER — Ambulatory Visit (INDEPENDENT_AMBULATORY_CARE_PROVIDER_SITE_OTHER): Payer: Medicare HMO | Admitting: Family Medicine

## 2018-06-01 ENCOUNTER — Ambulatory Visit: Payer: Self-pay

## 2018-06-01 ENCOUNTER — Telehealth: Payer: Self-pay | Admitting: Family Medicine

## 2018-06-01 ENCOUNTER — Encounter: Payer: Self-pay | Admitting: Family Medicine

## 2018-06-01 VITALS — BP 120/68 | HR 104 | Temp 98.2°F | Ht 64.5 in | Wt 115.8 lb

## 2018-06-01 DIAGNOSIS — M810 Age-related osteoporosis without current pathological fracture: Secondary | ICD-10-CM | POA: Diagnosis not present

## 2018-06-01 NOTE — Patient Instructions (Addendum)
We will price out prolia for you. We will be in touch to schedule this as well as with cost information.  Continue vitamin D. We will check levels next time we draw blood.   Try to get most or all of your calcium from your food--aim 1200 mg/day for women over 50 and men over 70.   To figure out dietary calcium: 300 mg/day from all non dairy foods plus 300 mg per cup of milk, other dairy, or fortified juice.   Non dairy foods that contain calcium: Kale, oranges, sardines, oatmeal, soy milk/soybeans, salmon, white beans, dried figs, turnip greens, almonds, broccoli, tofu.   Try to get regular weight bearing exercises. Pulmonary rehab will help.   Osteoporosis Osteoporosis is the thinning and loss of density in the bones. Osteoporosis makes the bones more brittle, fragile, and likely to break (fracture). Over time, osteoporosis can cause the bones to become so weak that they fracture after a simple fall. The bones most likely to fracture are the bones in the hip, wrist, and spine. What are the causes? The exact cause is not known. What increases the risk? Anyone can develop osteoporosis. You may be at greater risk if you have a family history of the condition or have poor nutrition. You may also have a higher risk if you are:  Female.  59 years old or older.  A smoker.  Not physically active.  White or Asian.  Slender.  What are the signs or symptoms? A fracture might be the first sign of the disease, especially if it results from a fall or injury that would not usually cause a bone to break. Other signs and symptoms include:  Low back and neck pain.  Stooped posture.  Height loss.  How is this diagnosed? To make a diagnosis, your health care provider may:  Take a medical history.  Perform a physical exam.  Order tests, such as: ? A bone mineral density test. ? A dual-energy X-ray absorptiometry test.  How is this treated? The goal of osteoporosis treatment is to  strengthen your bones to reduce your risk of a fracture. Treatment may involve:  Making lifestyle changes, such as: ? Eating a diet rich in calcium. ? Doing weight-bearing and muscle-strengthening exercises. ? Stopping tobacco use. ? Limiting alcohol intake.  Taking medicine to slow the process of bone loss or to increase bone density.  Monitoring your levels of calcium and vitamin D.  Follow these instructions at home:  Include calcium and vitamin D in your diet. Calcium is important for bone health, and vitamin D helps the body absorb calcium.  Perform weight-bearing and muscle-strengthening exercises as directed by your health care provider.  Do not use any tobacco products, including cigarettes, chewing tobacco, and electronic cigarettes. If you need help quitting, ask your health care provider.  Limit your alcohol intake.  Take medicines only as directed by your health care provider.  Keep all follow-up visits as directed by your health care provider. This is important.  Take precautions at home to lower your risk of falling, such as: ? Keeping rooms well lit and clutter free. ? Installing safety rails on stairs. ? Using rubber mats in the bathroom and other areas that are often wet or slippery. Get help right away if: You fall or injure yourself. This information is not intended to replace advice given to you by your health care provider. Make sure you discuss any questions you have with your health care provider. Document Released: 03/13/2005  Document Revised: 11/06/2015 Document Reviewed: 11/11/2013 Elsevier Interactive Patient Education  [employer].

## 2018-06-01 NOTE — Telephone Encounter (Signed)
Spoke with patient at [facility]. Can we price out prolia for her? Thank you.

## 2018-06-01 NOTE — Assessment & Plan Note (Signed)
Discussed diagnosis and treatment options. Reviewed calcium and vit D recommendations as well as regular weight bearing exercise as COPD allows. Discussed treatment options namely bisphosphonates and prolia. She is interested in prolia shots as she has trouble swallowing pills. Will ask [facility] to price out Prolia. Discussed possible risk of atypical fractures and osteonecrosis. Will need vit D and Ca checked prior to first shot.

## 2018-06-02 NOTE — Telephone Encounter (Signed)
Information has been submitted to pts insurance for verification of benefits. Awaiting response for coverage  

## 2018-06-03 ENCOUNTER — Ambulatory Visit: Payer: Self-pay

## 2018-06-04 NOTE — Addendum Note (Signed)
Addended by: Ria Bush on: 06/04/2018 08:33 AM   Modules accepted: Orders

## 2018-06-04 NOTE — Telephone Encounter (Signed)
Lm on pts vm requesting a call back 

## 2018-06-04 NOTE — Telephone Encounter (Signed)
Verification of benefits have been processed and an approval has been received for pts prolia injection. Pts estimated cost are appx $250. This is only an estimate and cannot be confirmed until benefits are paid. Please advise pt and schedule if needed. If scheduled, once the injection is received, pls contact me back with the date it was received so that I am able to update prolia folder. thanks  

## 2018-06-04 NOTE — Telephone Encounter (Signed)
Spoke to pt who is agreeable to charge; Ca lab and prolia injection scheduled.

## 2018-06-05 ENCOUNTER — Other Ambulatory Visit (INDEPENDENT_AMBULATORY_CARE_PROVIDER_SITE_OTHER): Payer: Medicare HMO

## 2018-06-05 ENCOUNTER — Ambulatory Visit: Payer: Self-pay

## 2018-06-05 ENCOUNTER — Ambulatory Visit: Payer: Self-pay | Admitting: Family Medicine

## 2018-06-05 ENCOUNTER — Other Ambulatory Visit: Payer: Self-pay

## 2018-06-05 DIAGNOSIS — M810 Age-related osteoporosis without current pathological fracture: Secondary | ICD-10-CM

## 2018-06-05 NOTE — Addendum Note (Signed)
Addended by: Ellamae Sia on: 06/05/2018 04:08 PM   Modules accepted: Orders

## 2018-06-06 ENCOUNTER — Other Ambulatory Visit: Payer: Self-pay | Admitting: Family Medicine

## 2018-06-06 LAB — CALCIUM: Calcium: 9.7 mg/dL (ref 8.6–10.4)

## 2018-06-06 LAB — VITAMIN D 25 HYDROXY (VIT D DEFICIENCY, FRACTURES): Vit D, 25-Hydroxy: 71 ng/mL (ref 30–100)

## 2018-06-08 ENCOUNTER — Ambulatory Visit: Payer: Self-pay

## 2018-06-08 ENCOUNTER — Ambulatory Visit (INDEPENDENT_AMBULATORY_CARE_PROVIDER_SITE_OTHER): Payer: Medicare HMO | Admitting: *Deleted

## 2018-06-08 DIAGNOSIS — M810 Age-related osteoporosis without current pathological fracture: Secondary | ICD-10-CM

## 2018-06-08 MED ORDER — DENOSUMAB 60 MG/ML ~~LOC~~ SOSY
60.0000 mg | PREFILLED_SYRINGE | Freq: Once | SUBCUTANEOUS | Status: AC
  Administered 2018-06-08: 60 mg via SUBCUTANEOUS

## 2018-06-08 NOTE — Progress Notes (Signed)
Per orders of Dr. Danise Mina, injection of prolia given by Modena Nunnery. Patient tolerated injection well.

## 2018-06-08 NOTE — Telephone Encounter (Signed)
Electronic refill request Hydroxyzine Last office visit 06/01/18 Last refill 03/17/18 #[id number]

## 2018-06-12 ENCOUNTER — Ambulatory Visit: Payer: Self-pay

## 2018-06-15 ENCOUNTER — Ambulatory Visit: Payer: Self-pay

## 2018-06-19 ENCOUNTER — Ambulatory Visit: Payer: Self-pay

## 2018-06-21 DIAGNOSIS — J449 Chronic obstructive pulmonary disease, unspecified: Secondary | ICD-10-CM | POA: Diagnosis not present

## 2018-06-22 ENCOUNTER — Ambulatory Visit: Payer: Self-pay

## 2018-06-24 ENCOUNTER — Ambulatory Visit: Payer: Self-pay

## 2018-06-26 ENCOUNTER — Ambulatory Visit: Payer: Self-pay

## 2018-06-29 ENCOUNTER — Ambulatory Visit: Payer: Self-pay

## 2018-07-01 ENCOUNTER — Ambulatory Visit: Payer: Self-pay

## 2018-07-03 ENCOUNTER — Ambulatory Visit: Payer: Self-pay

## 2018-07-04 ENCOUNTER — Other Ambulatory Visit: Payer: Self-pay | Admitting: Family Medicine

## 2018-07-06 ENCOUNTER — Ambulatory Visit: Payer: Self-pay

## 2018-07-08 ENCOUNTER — Ambulatory Visit: Payer: Self-pay

## 2018-07-10 ENCOUNTER — Ambulatory Visit: Payer: Self-pay

## 2018-07-20 ENCOUNTER — Encounter: Payer: Self-pay | Admitting: *Deleted

## 2018-07-20 ENCOUNTER — Encounter: Payer: Medicare Other | Attending: Pulmonary Disease | Admitting: *Deleted

## 2018-07-20 VITALS — Ht 64.5 in | Wt 115.9 lb

## 2018-07-20 DIAGNOSIS — J449 Chronic obstructive pulmonary disease, unspecified: Secondary | ICD-10-CM | POA: Diagnosis not present

## 2018-07-20 DIAGNOSIS — F329 Major depressive disorder, single episode, unspecified: Secondary | ICD-10-CM | POA: Diagnosis not present

## 2018-07-20 DIAGNOSIS — F17210 Nicotine dependence, cigarettes, uncomplicated: Secondary | ICD-10-CM | POA: Diagnosis not present

## 2018-07-20 DIAGNOSIS — Z8541 Personal history of malignant neoplasm of cervix uteri: Secondary | ICD-10-CM | POA: Insufficient documentation

## 2018-07-20 DIAGNOSIS — Z7982 Long term (current) use of aspirin: Secondary | ICD-10-CM | POA: Diagnosis not present

## 2018-07-20 DIAGNOSIS — Z79899 Other long term (current) drug therapy: Secondary | ICD-10-CM | POA: Insufficient documentation

## 2018-07-20 DIAGNOSIS — Z9981 Dependence on supplemental oxygen: Secondary | ICD-10-CM | POA: Diagnosis not present

## 2018-07-20 DIAGNOSIS — Z791 Long term (current) use of non-steroidal anti-inflammatories (NSAID): Secondary | ICD-10-CM | POA: Insufficient documentation

## 2018-07-20 NOTE — Progress Notes (Addendum)
Pulmonary Individual Treatment Plan  Patient Details  Name: Christine Serrano MRN: [id number] Date of Birth: 07-Sep-1960 Referring Provider:     Pulmonary Rehab from 07/20/2018 in [facility] Cardiac and Pulmonary Rehab  Referring Provider  Louretta Parma MD      Initial Encounter Date:    Pulmonary Rehab from 07/20/2018 in [facility] Cardiac and Pulmonary Rehab  Date  07/20/18      Visit Diagnosis: COPD, severe ([place])  Patient's Home Medications on Admission:  Current Outpatient Medications:  .  aspirin EC 81 MG tablet, Take 81 mg by mouth daily., Disp: , Rfl:  .  benzonatate (TESSALON) 100 MG capsule, Take 1 capsule (100 mg total) by mouth 2 (two) times daily as needed for cough., Disp: 30 capsule, Rfl: 3 .  Cholecalciferol (VITAMIN D) 2000 units tablet, Take 2,000 Units by mouth daily., Disp: , Rfl:  .  fluticasone (FLONASE) 50 MCG/ACT nasal spray, Place 2 sprays into both nostrils daily., Disp: 16 g, Rfl: 3 .  hydrOXYzine (ATARAX/VISTARIL) 25 MG tablet, TAKE 1 TABLET (25 MG TOTAL) BY MOUTH 2 (TWO) TIMES DAILY AS NEEDED FOR ANXIETY., Disp: 180 tablet, Rfl: 0 .  ipratropium-albuterol (DUONEB) 0.5-2.5 (3) MG/3ML SOLN, Inhale 3 mLs into the lungs every 6 (six) hours as needed., Disp: 360 mL, Rfl: 3 .  LINZESS 290 MCG CAPS capsule, Take 1 capsule (290 mcg total) by mouth daily., Disp: 90 capsule, Rfl: 3 .  Multiple Vitamins-Minerals (MULTIVITAMIN ADULT) TABS, Take 1 tablet by mouth daily., Disp: , Rfl:  .  naproxen (NAPROSYN) 375 MG tablet, TAKE 1 TABLET (375 MG TOTAL) BY MOUTH 2 (TWO) TIMES DAILY AS NEEDED FOR MODERATE PAIN., Disp: 40 tablet, Rfl: 1 .  OXYGEN, Inhale 3 L/min into the lungs continuous., Disp: , Rfl:  .  sertraline (ZOLOFT) 100 MG tablet, Take 1.5 tablets (150 mg total) by mouth daily., Disp: 135 tablet, Rfl: 3 .  SYMBICORT 160-4.5 MCG/ACT inhaler, Inhale 2 puffs into the lungs 2 (two) times daily., Disp: 3 Inhaler, Rfl: 3 .  tiotropium (SPIRIVA) 18 MCG inhalation capsule, Place 1 capsule  (18 mcg total) into inhaler and inhale daily., Disp: 90 capsule, Rfl: 4 .  traZODone (DESYREL) 50 MG tablet, Take 0.5 tablets (25 mg total) by mouth at bedtime., Disp: 90 tablet, Rfl: 1 .  VENTOLIN HFA 108 (90 Base) MCG/ACT inhaler, Inhale 2 puffs into the lungs every 6 (six) hours as needed for wheezing or shortness of breath., Disp: 54 g, Rfl: 1 .  vitamin B-12 (CYANOCOBALAMIN) 1000 MCG tablet, Take 1,000 mcg by mouth daily., Disp: , Rfl:  .  vitamin E 400 UNIT capsule, Take 400 Units by mouth daily. , Disp: , Rfl:  .  clotrimazole (LOTRIMIN) 1 % cream, Apply 1 application topically 2 (two) times daily. For 2 weeks to rash (Patient not taking: Reported on 07/20/2018), Disp: 60 g, Rfl: 0 .  gabapentin (NEURONTIN) 100 MG capsule, Take one tablet nightly for 4 days then twice daily for 4 days then three times daily (Patient not taking: Reported on 07/20/2018), Disp: 90 capsule, Rfl: 3 .  Na Sulfate-K Sulfate-Mg Sulf (SUPREP BOWEL PREP KIT) 17.5-3.13-1.6 GM/177ML SOLN, Take 1 kit by mouth as directed. (Patient not taking: Reported on 07/20/2018), Disp: 324 mL, Rfl: 0  Past Medical History: Past Medical History:  Diagnosis Date  . Cervical cancer ([place])   . Chronic idiopathic constipation   . COPD (chronic obstructive pulmonary disease) ([place])   . Depression   . Endometriosis   . HLD (  hyperlipidemia)     Tobacco Use: Social History   Tobacco Use  Smoking Status Current Some Day Smoker  . Packs/day: 0.50  . Years: 47.00  . Pack years: 23.50  . Types: Cigarettes  Smokeless Tobacco Never Used  Tobacco Comment   07/20/2018 down to 1/3 ppd, working on quitting  for transplant, given fake cigarette    Labs: Recent Review Flowsheet Data    Labs for ITP Cardiac and Pulmonary Rehab Latest Ref Rng & Units 04/04/2009 04/09/2016 02/05/2017 02/06/2018   Cholestrol 0 - 200 mg/dL - - 207(H) 181   LDLCALC 0 - 99 mg/dL - - 137(H) 105(H)   HDL >39.00 mg/dL - - 43.60 45.30   Trlycerides 0.0 - 149.0 mg/dL - -  133.0 153.0(H)   PHART 7.350 - 7.450 7.302(L) 7.30(L) - -   PCO2ART 32.0 - 48.0 mmHg 59.5 CRITICAL RESULT CALLED TO, READ BACK BY AND VERIFIED WITH: [facility] 04/04/09 AT 1440 CRITICAL RESULT CALLED TO, READ BACK BY AND VERIFIED WITH: [facility] 04/04/09 AT 1440 BY BROADNAX,L.RRT(HH) 65(H) - -   HCO3 20.0 - 28.0 mmol/L 28.5(H) 32.0(H) - -   TCO2 0 - 100 mmol/L 25.9 - - -   O2SAT % 95.5 88.7 - -       Pulmonary Assessment Scores: Pulmonary Assessment Scores    Row Name 07/20/18 1652         ADL UCSD   ADL Phase  Entry pt will bring paperwork back first day of exericse       mMRC Score   mMRC Score  4        Pulmonary Function Assessment: Pulmonary Function Assessment - 07/20/18 1646      Initial Spirometry Results   FVC%  48 %    FEV1%  31 %    FEV1/FVC Ratio  53    Comments  Test date 01/15/18      Post Bronchodilator Spirometry Results   FVC%  51 %    FEV1%  29 %    FEV1/FVC Ratio  46    Comments  Test date 01/15/18      Breath   Bilateral Breath Sounds  Wheezes    Shortness of Breath  Yes;Limiting activity;Fear of Shortness of Breath       Exercise Target Goals: Exercise Program Goal: Individual exercise prescription set using results from initial 6 min walk test and THRR while considering  patient's activity barriers and safety.   Exercise Prescription Goal: Initial exercise prescription builds to 30-45 minutes a day of aerobic activity, 2-3 days per week.  Home exercise guidelines will be given to patient during program as part of exercise prescription that the participant will acknowledge.  Activity Barriers & Risk Stratification: Activity Barriers & Cardiac Risk Stratification - 07/20/18 1639      Activity Barriers & Cardiac Risk Stratification   Activity Barriers  Balance Concerns;Deconditioning;Muscular Weakness;Shortness of Breath;Back Problems   Hx of low back fracture, osteoprosis in hips and back      6 Minute Walk: 6 Minute Walk    Row Name  07/20/18 1637         6 Minute Walk   Phase  Initial     Distance  800 feet     Walk Time  4.5 minutes     # of Rest Breaks  1 1:30     MPH  2.02     METS  3.2     RPE  19     Perceived Dyspnea  4     VO2 Peak  11.19     Symptoms  Yes (comment)     Comments  SOB     Resting HR  90 bpm     Resting BP  100/60     Resting Oxygen Saturation   93 %     Exercise Oxygen Saturation  during 6 min walk  83 %     Max Ex. HR  133 bpm     Max Ex. BP  118/70     2 Minute Post BP  102/68       Interval HR   1 Minute HR  109     2 Minute HR  120     3 Minute HR  128     4 Minute HR  133     5 Minute HR  117     6 Minute HR  126     2 Minute Post HR  100     Interval Heart Rate?  Yes       Interval Oxygen   Interval Oxygen?  Yes     Baseline Oxygen Saturation %  93 %     1 Minute Oxygen Saturation %  90 %     1 Minute Liters of Oxygen  3 L pulsed     2 Minute Oxygen Saturation %  90 %     2 Minute Liters of Oxygen  3 L     3 Minute Oxygen Saturation %  85 %     3 Minute Liters of Oxygen  3 L     4 Minute Oxygen Saturation %  83 % resting     4 Minute Liters of Oxygen  3 L     5 Minute Oxygen Saturation %  92 %     5 Minute Liters of Oxygen  3 L     6 Minute Oxygen Saturation %  91 %     6 Minute Liters of Oxygen  3 L     2 Minute Post Oxygen Saturation %  92 %     2 Minute Post Liters of Oxygen  3 L       Oxygen Initial Assessment: Oxygen Initial Assessment - 07/20/18 1541      Home Oxygen   Home Oxygen Device  Home Concentrator;Portable Concentrator;E-Tanks    Sleep Oxygen Prescription  Continuous    Liters per minute  3    Home Exercise Oxygen Prescription  Pulsed    Liters per minute  3    Home at Rest Exercise Oxygen Prescription  Continuous    Liters per minute  3    Compliance with Home Oxygen Use  Yes      Initial 6 min Walk   Oxygen Used  Pulsed    Liters per minute  3      Program Oxygen Prescription   Program Oxygen Prescription  Continuous;E-Tanks     Liters per minute  3      Intervention   Short Term Goals  To learn and exhibit compliance with exercise, home and travel O2 prescription;To learn and understand importance of monitoring SPO2 with pulse oximeter and demonstrate accurate use of the pulse oximeter.;To learn and understand importance of maintaining oxygen saturations>88%;To learn and demonstrate proper pursed lip breathing techniques or other breathing techniques.;To learn and demonstrate proper use of respiratory medications    Long  Term Goals  Exhibits compliance with exercise, home and travel O2 prescription;Verbalizes importance  of monitoring SPO2 with pulse oximeter and return demonstration;Maintenance of O2 saturations>88%;Exhibits proper breathing techniques, such as pursed lip breathing or other method taught during program session;Compliance with respiratory medication;Demonstrates proper use of MDI's       Oxygen Re-Evaluation:   Oxygen Discharge (Final Oxygen Re-Evaluation):   Initial Exercise Prescription: Initial Exercise Prescription - 07/20/18 1600      Date of Initial Exercise RX and Referring Provider   Date  07/20/18    Referring Provider  Louretta Parma MD      Oxygen   Oxygen  Continuous    Liters  3      Treadmill   MPH  1.7    Grade  0.5    Minutes  15    METs  2.42      NuStep   Level  1    SPM  80    Minutes  15    METs  2      REL-XR   Level  1    Speed  50    Minutes  15    METs  2      Prescription Details   Frequency (times per week)  3    Duration  Progress to 45 minutes of aerobic exercise without signs/symptoms of physical distress      Intensity   THRR 40-80% of Max Heartrate  118-147    Ratings of Perceived Exertion  11-13    Perceived Dyspnea  0-4      Progression   Progression  Continue to progress workloads to maintain intensity without signs/symptoms of physical distress.      Resistance Training   Training Prescription  Yes    Weight  3 lbs    Reps  10-15        Perform Capillary Blood Glucose checks as needed.  Exercise Prescription Changes:  Exercise Prescription Changes    Row Name 07/20/18 1600             Response to Exercise   Blood Pressure (Admit)  100/60       Blood Pressure (Exercise)  118/70       Blood Pressure (Exit)  102/68       Heart Rate (Admit)  90 bpm       Heart Rate (Exercise)  133 bpm       Heart Rate (Exit)  100 bpm       Oxygen Saturation (Admit)  93 %       Oxygen Saturation (Exercise)  83 %       Oxygen Saturation (Exit)  92 %       Rating of Perceived Exertion (Exercise)  19       Perceived Dyspnea (Exercise)  4       Symptoms  SOB       Comments  walk test results          Exercise Comments:   Exercise Goals and Review:  Exercise Goals    Row Name 07/20/18 1643             Exercise Goals   Increase Physical Activity  Yes       Intervention  Provide advice, education, support and counseling about physical activity/exercise needs.;Develop an individualized exercise prescription for aerobic and resistive training based on initial evaluation findings, risk stratification, comorbidities and participant's personal goals.       Expected Outcomes  Short Term: Attend rehab on a regular basis to increase amount of physical  activity.;Long Term: Add in home exercise to make exercise part of routine and to increase amount of physical activity.;Long Term: Exercising regularly at least 3-5 days a week.       Increase Strength and Stamina  Yes       Intervention  Provide advice, education, support and counseling about physical activity/exercise needs.;Develop an individualized exercise prescription for aerobic and resistive training based on initial evaluation findings, risk stratification, comorbidities and participant's personal goals.       Expected Outcomes  Short Term: Increase workloads from initial exercise prescription for resistance, speed, and METs.;Long Term: Improve cardiorespiratory fitness,  muscular endurance and strength as measured by increased METs and functional capacity (6MWT);Short Term: Perform resistance training exercises routinely during rehab and add in resistance training at home       Able to understand and use rate of perceived exertion (RPE) scale  Yes       Intervention  Provide education and explanation on how to use RPE scale       Expected Outcomes  Short Term: Able to use RPE daily in rehab to express subjective intensity level;Long Term:  Able to use RPE to guide intensity level when exercising independently       Able to understand and use Dyspnea scale  Yes       Intervention  Provide education and explanation on how to use Dyspnea scale       Expected Outcomes  Short Term: Able to use Dyspnea scale daily in rehab to express subjective sense of shortness of breath during exertion;Long Term: Able to use Dyspnea scale to guide intensity level when exercising independently       Knowledge and understanding of Target Heart Rate Range (THRR)  Yes       Intervention  Provide education and explanation of THRR including how the numbers were predicted and where they are located for reference       Expected Outcomes  Short Term: Able to state/look up THRR;Short Term: Able to use daily as guideline for intensity in rehab;Long Term: Able to use THRR to govern intensity when exercising independently       Able to check pulse independently  Yes       Intervention  Provide education and demonstration on how to check pulse in carotid and radial arteries.;Review the importance of being able to check your own pulse for safety during independent exercise       Expected Outcomes  Short Term: Able to explain why pulse checking is important during independent exercise;Long Term: Able to check pulse independently and accurately       Understanding of Exercise Prescription  Yes       Intervention  Provide education, explanation, and written materials on patient's individual exercise  prescription       Expected Outcomes  Short Term: Able to explain program exercise prescription;Long Term: Able to explain home exercise prescription to exercise independently          Exercise Goals Re-Evaluation :   Discharge Exercise Prescription (Final Exercise Prescription Changes): Exercise Prescription Changes - 07/20/18 1600      Response to Exercise   Blood Pressure (Admit)  100/60    Blood Pressure (Exercise)  118/70    Blood Pressure (Exit)  102/68    Heart Rate (Admit)  90 bpm    Heart Rate (Exercise)  133 bpm    Heart Rate (Exit)  100 bpm    Oxygen Saturation (Admit)  93 %  Oxygen Saturation (Exercise)  83 %    Oxygen Saturation (Exit)  92 %    Rating of Perceived Exertion (Exercise)  19    Perceived Dyspnea (Exercise)  4    Symptoms  SOB    Comments  walk test results       Nutrition:  Target Goals: Understanding of nutrition guidelines, daily intake of sodium <1515m, cholesterol <206m calories 30% from fat and 7% or less from saturated fats, daily to have 5 or more servings of fruits and vegetables.  Biometrics: Pre Biometrics - 07/20/18 1645      Pre Biometrics   Height  5' 4.5" (1.638 m)    Weight  115 lb 14.4 oz (52.6 kg)    Waist Circumference  32 inches    Hip Circumference  37 inches    Waist to Hip Ratio  0.86 %    BMI (Calculated)  19.59        Nutrition Therapy Plan and Nutrition Goals: Nutrition Therapy & Goals - 07/20/18 1551      Intervention Plan   Intervention  Prescribe, educate and counsel regarding individualized specific dietary modifications aiming towards targeted core components such as weight, hypertension, lipid management, diabetes, heart failure and other comorbidities.;Nutrition handout(s) given to patient.    Expected Outcomes  Short Term Goal: Understand basic principles of dietary content, such as calories, fat, sodium, cholesterol and nutrients.;Long Term Goal: Adherence to prescribed nutrition plan.        Nutrition Assessments: Nutrition Assessments - 07/20/18 1645      Rate Your Plate Scores   Pre Score  --   Pt will bring back first day of exercise      Nutrition Goals Re-Evaluation:   Nutrition Goals Discharge (Final Nutrition Goals Re-Evaluation):   Psychosocial: Target Goals: Acknowledge presence or absence of significant depression and/or stress, maximize coping skills, provide positive support system. Participant is able to verbalize types and ability to use techniques and skills needed for reducing stress and depression.   Initial Review & Psychosocial Screening: Initial Psych Review & Screening - 07/20/18 1548      Initial Review   Current issues with  Current Depression;History of Depression;Current Sleep Concerns;Current Stress Concerns;Current Psychotropic Meds    Source of Stress Concerns  Chronic Illness;Unable to perform yard/household activities;Unable to participate in former interests or hobbies    Comments  Working on quitting smoking, still sleeping more and has trouble with sleeping, she has moved bedrooms. She will need to get worked up for transplant      FaClimax No    Strains  Intra-family strains;Illness and family care strain    Concerns  No support system    Comments  Her fiance is doing better, but still not in good place. She relies on her animals as well.       Barriers   Psychosocial barriers to participate in program  The patient should benefit from training in stress management and relaxation.;Psychosocial barriers identified (see note)      Screening Interventions   Interventions  Program counselor consult;Encouraged to exercise;To provide support and resources with identified psychosocial needs;Provide feedback about the scores to participant    Expected Outcomes  Short Term goal: Utilizing psychosocial counselor, staff and physician to assist with identification of specific Stressors or current issues  interfering with healing process. Setting desired goal for each stressor or current issue identified.;Long Term Goal: Stressors or current issues are controlled or eliminated.;Short Term  goal: Identification and review with participant of any Quality of Life or Depression concerns found by scoring the questionnaire.;Long Term goal: The participant improves quality of Life and PHQ9 Scores as seen by post scores and/or verbalization of changes       Quality of Life Scores:  Scores of 19 and below usually indicate a poorer quality of life in these areas.  A difference of  2-3 points is a clinically meaningful difference.  A difference of 2-3 points in the total score of the Quality of Life Index has been associated with significant improvement in overall quality of life, self-image, physical symptoms, and general health in studies assessing change in quality of life.  PHQ-9: Recent Review Flowsheet Data    Depression screen [facility] 2/9 07/20/2018 03/09/2018 02/06/2018 08/18/2017 07/07/2017   Decreased Interest _0 Down, Depressed, Hopeless _1 0 2   PHQ - 2 Score _2 Altered sleeping _3 Tired, decreased energy _4 Change in appetite _5 0 2   Feeling bad or failure about yourself  _6 0 0   Trouble concentrating 3 1 0 1 1   Moving slowly or fidgety/restless 0 1 0 0 1   Suicidal thoughts 0 0 0 0 0   PHQ-9 Score _7 Difficult doing work/chores Very difficult Extremely dIfficult Very difficult - -     Interpretation of Total Score  Total Score Depression Severity:  1-4 = Minimal depression, 5-9 = Mild depression, 10-14 = Moderate depression, 15-19 = Moderately severe depression, 20-27 = Severe depression   Psychosocial Evaluation and Intervention:   Psychosocial Re-Evaluation:   Psychosocial Discharge (Final Psychosocial Re-Evaluation):   Education: Education Goals: Education classes will be provided on a weekly basis, covering required topics.  Participant will state understanding/return demonstration of topics presented.  Learning Barriers/Preferences:   Education Topics:  Initial Evaluation Education: - Verbal, written and demonstration of respiratory meds, oximetry and breathing techniques. Instruction on use of nebulizers and MDIs and importance of monitoring MDI activations.   Pulmonary Rehab from 07/20/2018 in [facility] Cardiac and Pulmonary Rehab  Date  07/20/18  Educator  [facility]  Instruction Review Code  1- Verbalizes Understanding      General Nutrition Guidelines/Fats and Fiber: -Group instruction provided by verbal, written material, models and posters to present the general guidelines for heart healthy nutrition. Gives an explanation and review of dietary fats and fiber.   Controlling Sodium/Reading Food Labels: -Group verbal and written material supporting the discussion of sodium use in heart healthy nutrition. Review and explanation with models, verbal and written materials for utilization of the food label.   Exercise Physiology & General Exercise Guidelines: - Group verbal and written instruction with models to review the exercise physiology of the cardiovascular system and associated critical values. Provides general exercise guidelines with specific guidelines to those with heart or lung disease.    Aerobic Exercise & Resistance Training: - Gives group verbal and written instruction on the various components of exercise. Focuses on aerobic and resistive training programs and the benefits of this training and how to safely progress through these programs.   Flexibility, Balance, Mind/Body Relaxation: Provides group verbal/written instruction on the benefits of flexibility and balance training, including mind/body exercise modes such as yoga, pilates and tai chi.  Demonstration and skill practice provided.   Stress  and Anxiety: - Provides group verbal and written instruction about the health risks of elevated  stress and causes of high stress.  Discuss the correlation between heart/lung disease and anxiety and treatment options. Review healthy ways to manage with stress and anxiety.   Depression: - Provides group verbal and written instruction on the correlation between heart/lung disease and depressed mood, treatment options, and the stigmas associated with seeking treatment.   Exercise & Equipment Safety: - Individual verbal instruction and demonstration of equipment use and safety with use of the equipment.   Pulmonary Rehab from 07/20/2018 in [facility] Cardiac and Pulmonary Rehab  Date  07/20/18  Educator  [facility]  Instruction Review Code  1- Verbalizes Understanding      Infection Prevention: - Provides verbal and written material to individual with discussion of infection control including proper hand washing and proper equipment cleaning during exercise session.   Pulmonary Rehab from 07/20/2018 in [facility] Cardiac and Pulmonary Rehab  Date  07/20/18  Educator  [facility]  Instruction Review Code  1- Verbalizes Understanding      Falls Prevention: - Provides verbal and written material to individual with discussion of falls prevention and safety.   Pulmonary Rehab from 07/20/2018 in [facility] Cardiac and Pulmonary Rehab  Date  07/20/18  Educator  [facility]  Instruction Review Code  1- Verbalizes Understanding      Diabetes: - Individual verbal and written instruction to review signs/symptoms of diabetes, desired ranges of glucose level fasting, after meals and with exercise. Advice that pre and post exercise glucose checks will be done for 3 sessions at entry of program.   Chronic Lung Diseases: - Group verbal and written instruction to review updates, respiratory medications, advancements in procedures and treatments. Discuss use of supplemental oxygen including available portable oxygen systems, continuous and intermittent flow rates, concentrators, personal use and safety guidelines. Review proper use of inhaler  and spacers. Provide informative websites for self-education.    Energy Conservation: - Provide group verbal and written instruction for methods to conserve energy, plan and organize activities. Instruct on pacing techniques, use of adaptive equipment and posture/positioning to relieve shortness of breath.   Triggers and Exacerbations: - Group verbal and written instruction to review types of environmental triggers and ways to prevent exacerbations. Discuss weather changes, air quality and the benefits of nasal washing. Review warning signs and symptoms to help prevent infections. Discuss techniques for effective airway clearance, coughing, and vibrations.   Pulmonary Rehab from 08/07/2016 in [facility] Cardiac and Pulmonary Rehab  Date  08/07/16  Educator  [facility]  Instruction Review Code (retired)  2- meets goals/outcomes      AED/CPR: - Group verbal and written instruction with the use of models to demonstrate the basic use of the AED with the basic ABC's of resuscitation.   Anatomy and Physiology of the Lungs: - Group verbal and written instruction with the use of models to provide basic lung anatomy and physiology related to function, structure and complications of lung disease.   Anatomy & Physiology of the Heart: - Group verbal and written instruction and models provide basic cardiac anatomy and physiology, with the coronary electrical and arterial systems. Review of Valvular disease and Heart Failure   Cardiac Medications: - Group verbal and written instruction to review commonly prescribed medications for heart disease. Reviews the medication, class of the drug, and side effects.   Know Your Numbers and Risk Factors: -Group verbal and written instruction about important numbers in your health.  Discussion of what  are risk factors and how they play a role in the disease process.  Review of Cholesterol, Blood Pressure, Diabetes, and BMI and the role they play in your overall  health.   Sleep Hygiene: -Provides group verbal and written instruction about how sleep can affect your health.  Define sleep hygiene, discuss sleep cycles and impact of sleep habits. Review good sleep hygiene tips.    Other: -Provides group and verbal instruction on various topics (see comments)    Knowledge Questionnaire Score: Knowledge Questionnaire Score - 07/20/18 1645      Knowledge Questionnaire Score   Pre Score  --   Pt will bring back first day of exercise       Core Components/Risk Factors/Patient Goals at Admission: Personal Goals and Risk Factors at Admission - 07/20/18 1551      Core Components/Risk Factors/Patient Goals on Admission    Weight Management  Yes;Weight Maintenance;Weight Gain    Intervention  Weight Management: Develop a combined nutrition and exercise program designed to reach desired caloric intake, while maintaining appropriate intake of nutrient and fiber, sodium and fats, and appropriate energy expenditure required for the weight goal.;Weight Management: Provide education and appropriate resources to help participant work on and attain dietary goals.    Admit Weight  115 lb 14.4 oz (52.6 kg)    Goal Weight: Short Term  120 lb (54.4 kg)    Goal Weight: Long Term  125 lb (56.7 kg)    Expected Outcomes  Weight Gain: Understanding of general recommendations for a high calorie, high protein meal plan that promotes weight gain by distributing calorie intake throughout the day with the consumption for 4-5 meals, snacks, and/or supplements;Short Term: Continue to assess and modify interventions until short term weight is achieved;Long Term: Adherence to nutrition and physical activity/exercise program aimed toward attainment of established weight goal;Understanding recommendations for meals to include 15-35% energy as protein, 25-35% energy from fat, 35-60% energy from carbohydrates, less than 233m of dietary cholesterol, 20-35 gm of total fiber  daily;Understanding of distribution of calorie intake throughout the day with the consumption of 4-5 meals/snacks    Tobacco Cessation  Yes    Number of packs per day  0.3    Intervention  Assist the participant in steps to quit. Provide individualized education and counseling about committing to Tobacco Cessation, relapse prevention, and pharmacological support that can be provided by physician.;OAdvice worker assist with locating and accessing local/national Quit Smoking programs, and support quit date choice.    Expected Outcomes  Short Term: Will demonstrate readiness to quit, by selecting a quit date.;Short Term: Will quit all tobacco product use, adhering to prevention of relapse plan.;Long Term: Complete abstinence from all tobacco products for at least 12 months from quit date.    Improve shortness of breath with ADL's  Yes    Intervention  Provide education, individualized exercise plan and daily activity instruction to help decrease symptoms of SOB with activities of daily living.    Expected Outcomes  Short Term: Improve cardiorespiratory fitness to achieve a reduction of symptoms when performing ADLs;Long Term: Be able to perform more ADLs without symptoms or delay the onset of symptoms    Intervention  Provide education and support for participant on nutrition & aerobic/resistive exercise along with prescribed medications to achieve LDL <762m HDL >4030m   Expected Outcomes  Short Term: Participant states understanding of desired cholesterol values and is compliant with medications prescribed. Participant is following exercise prescription and nutrition guidelines.;Long Term:  Cholesterol controlled with medications as prescribed, with individualized exercise RX and with personalized nutrition plan. Value goals: LDL < 62m, HDL > 40 mg.       Core Components/Risk Factors/Patient Goals Review:    Core Components/Risk Factors/Patient Goals at Discharge (Final Review):     ITP Comments: ITP Comments    Row Name 07/20/18 1636           ITP Comments  Medical evaluation and walk test completed today.  [facility]  Initial ITP created and sent for review to Dr. MEmily Filbert Medical Director.           Comments: Initial ITP

## 2018-07-20 NOTE — Progress Notes (Signed)
Daily Session Note  Patient Details  Name: Christine Serrano MRN: [id number] Date of Birth: 12/29/1960 Referring Provider:     Pulmonary Rehab from 07/20/2018 in [facility]  Referring Provider  Simonda, David MD      Encounter Date: 07/20/2018  Check In: Session Check In - 07/20/18 1634      Check-In   Supervising physician immediately available to respond to emergencies  [facility] immediately available ER MD    Physician(s)  Dr. Robinson and Stafford    Location  [facility]    Staff Present  Jessica Hawkins, MA, RCEP, CCRP, Exercise Physiologist;Kelly Hayes, BS, ACSM CEP, Exercise Physiologist    Medication changes reported      No    Fall or balance concerns reported     No    Warm-up and Cool-down  Not performed (comment)   Medical evaluation and walk test completed   Resistance Training Performed  No    VAD Patient?  No    PAD/SET Patient?  No      Pain Assessment   Currently in Pain?  No/denies        Exercise Prescription Changes - 07/20/18 1600      Response to Exercise   Blood Pressure (Admit)  100/60    Blood Pressure (Exercise)  118/70    Blood Pressure (Exit)  102/68    Heart Rate (Admit)  90 bpm    Heart Rate (Exercise)  133 bpm    Heart Rate (Exit)  100 bpm    Oxygen Saturation (Admit)  93 %    Oxygen Saturation (Exercise)  83 %    Oxygen Saturation (Exit)  92 %    Rating of Perceived Exertion (Exercise)  19    Perceived Dyspnea (Exercise)  4    Symptoms  SOB    Comments  walk test results       Social History   Tobacco Use  Smoking Status Current Some Day Smoker  . Packs/day: 0.50  . Years: 47.00  . Pack years: 23.50  . 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 Mark Miller is Medical Director for [facility] and [facility]. 

## 2018-07-20 NOTE — Patient Instructions (Signed)
Patient Instructions  Patient Details  Name: Christine Serrano MRN: [id number] Date of Birth: 1961-03-10 Referring Provider:  Wilhelmina Mcardle, MD  Below are your personal goals for exercise, nutrition, and risk factors. Our goal is to help you stay on track towards obtaining and maintaining these goals. We will be discussing your progress on these goals with you throughout the program.  Initial Exercise Prescription: Initial Exercise Prescription - 07/20/18 1600      Date of Initial Exercise RX and Referring Provider   Date  07/20/18    Referring Provider  Louretta Parma MD      Oxygen   Oxygen  Continuous    Liters  3      Treadmill   MPH  1.7    Grade  0.5    Minutes  15    METs  2.42      NuStep   Level  1    SPM  80    Minutes  15    METs  2      REL-XR   Level  1    Speed  50    Minutes  15    METs  2      Prescription Details   Frequency (times per week)  3    Duration  Progress to 45 minutes of aerobic exercise without signs/symptoms of physical distress      Intensity   THRR 40-80% of Max Heartrate  118-147    Ratings of Perceived Exertion  11-13    Perceived Dyspnea  0-4      Progression   Progression  Continue to progress workloads to maintain intensity without signs/symptoms of physical distress.      Resistance Training   Training Prescription  Yes    Weight  3 lbs    Reps  10-15       Exercise Goals: Frequency: Be able to perform aerobic exercise two to three times per week in program working toward 2-5 days per week of home exercise.  Intensity: Work with a perceived exertion of 11 (fairly light) - 15 (hard) while following your exercise prescription.  We will make changes to your prescription with you as you progress through the program.   Duration: Be able to do 30 to 45 minutes of continuous aerobic exercise in addition to a 5 minute warm-up and a 5 minute cool-down routine.   Nutrition Goals: Your personal nutrition goals will be  established when you do your nutrition analysis with the dietician.  The following are general nutrition guidelines to follow: Cholesterol < 200mg /day Sodium < 1500mg /day Fiber: Women over 50 yrs - 21 grams per day  Personal Goals: Personal Goals and Risk Factors at Admission - 07/20/18 1551      Core Components/Risk Factors/Patient Goals on Admission    Weight Management  Yes;Weight Maintenance;Weight Gain    Intervention  Weight Management: Develop a combined nutrition and exercise program designed to reach desired caloric intake, while maintaining appropriate intake of nutrient and fiber, sodium and fats, and appropriate energy expenditure required for the weight goal.;Weight Management: Provide education and appropriate resources to help participant work on and attain dietary goals.    Admit Weight  115 lb 14.4 oz (52.6 kg)    Goal Weight: Short Term  120 lb (54.4 kg)    Goal Weight: Long Term  125 lb (56.7 kg)    Expected Outcomes  Weight Gain: Understanding of general recommendations for a high calorie, high protein meal  plan that promotes weight gain by distributing calorie intake throughout the day with the consumption for 4-5 meals, snacks, and/or supplements;Short Term: Continue to assess and modify interventions until short term weight is achieved;Long Term: Adherence to nutrition and physical activity/exercise program aimed toward attainment of established weight goal;Understanding recommendations for meals to include 15-35% energy as protein, 25-35% energy from fat, 35-60% energy from carbohydrates, less than 200mg  of dietary cholesterol, 20-35 gm of total fiber daily;Understanding of distribution of calorie intake throughout the day with the consumption of 4-5 meals/snacks    Tobacco Cessation  Yes    Number of packs per day  0.3    Intervention  Assist the participant in steps to quit. Provide individualized education and counseling about committing to Tobacco Cessation, relapse  prevention, and pharmacological support that can be provided by physician.;Advice worker, assist with locating and accessing local/national Quit Smoking programs, and support quit date choice.    Expected Outcomes  Short Term: Will demonstrate readiness to quit, by selecting a quit date.;Short Term: Will quit all tobacco product use, adhering to prevention of relapse plan.;Long Term: Complete abstinence from all tobacco products for at least 12 months from quit date.    Improve shortness of breath with ADL's  Yes    Intervention  Provide education, individualized exercise plan and daily activity instruction to help decrease symptoms of SOB with activities of daily living.    Expected Outcomes  Short Term: Improve cardiorespiratory fitness to achieve a reduction of symptoms when performing ADLs;Long Term: Be able to perform more ADLs without symptoms or delay the onset of symptoms    Intervention  Provide education and support for participant on nutrition & aerobic/resistive exercise along with prescribed medications to achieve LDL 70mg , HDL >40mg .    Expected Outcomes  Short Term: Participant states understanding of desired cholesterol values and is compliant with medications prescribed. Participant is following exercise prescription and nutrition guidelines.;Long Term: Cholesterol controlled with medications as prescribed, with individualized exercise RX and with personalized nutrition plan. Value goals: LDL < 70mg , HDL > 40 mg.       Tobacco Use Initial Evaluation: Social History   Tobacco Use  Smoking Status Current Some Day Smoker  . Packs/day: 0.50  . Years: 47.00  . Pack years: 23.50  . 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       Expected Outcomes  Short Term: Attend rehab on a regular basis to increase amount of physical activity.;Long Term: Add in home exercise to make exercise part of routine and to increase amount of physical activity.;Long Term: Exercising regularly at least 3-5 days a week.       Increase Strength and Stamina  Yes       Intervention  Provide advice, education, support and counseling about physical activity/exercise needs.;Develop an individualized exercise prescription for aerobic and resistive training based on initial evaluation findings, risk stratification, comorbidities and participant's personal goals.  Expected Outcomes  Short Term: Increase workloads from initial exercise prescription for resistance, speed, and METs.;Long Term: Improve cardiorespiratory fitness, muscular endurance and strength as measured by increased METs and functional capacity (6MWT);Short Term: Perform resistance training exercises routinely during rehab and add in resistance training at home       Able to understand and use rate of perceived exertion (RPE) scale  Yes       Intervention  Provide education and explanation on how to use RPE scale       Expected Outcomes  Short Term: Able to use RPE daily in rehab to express subjective intensity level;Long Term:  Able to use RPE to guide intensity level when exercising independently       Able to understand and use Dyspnea scale  Yes       Intervention  Provide education and explanation on how to use Dyspnea scale       Expected Outcomes  Short Term: Able to use Dyspnea scale daily in rehab to express subjective sense of shortness of breath during exertion;Long Term: Able  to use Dyspnea scale to guide intensity level when exercising independently       Knowledge and understanding of Target Heart Rate Range (THRR)  Yes       Intervention  Provide education and explanation of THRR including how the numbers were predicted and where they are located for reference       Expected Outcomes  Short Term: Able to state/look up THRR;Short Term: Able to use daily as guideline for intensity in rehab;Long Term: Able to use THRR to govern intensity when exercising independently       Able to check pulse independently  Yes       Intervention  Provide education and demonstration on how to check pulse in carotid and radial arteries.;Review the importance of being able to check your own pulse for safety during independent exercise       Expected Outcomes  Short Term: Able to explain why pulse checking is important during independent exercise;Long Term: Able to check pulse independently and accurately       Understanding of Exercise Prescription  Yes       Intervention  Provide education, explanation, and written materials on patient's individual exercise prescription       Expected Outcomes  Short Term: Able to explain program exercise prescription;Long Term: Able to explain home exercise prescription to exercise independently          Copy of goals given to participant.

## 2018-07-22 ENCOUNTER — Encounter: Payer: Medicare Other | Admitting: *Deleted

## 2018-07-22 DIAGNOSIS — J449 Chronic obstructive pulmonary disease, unspecified: Secondary | ICD-10-CM

## 2018-07-22 DIAGNOSIS — Z9981 Dependence on supplemental oxygen: Secondary | ICD-10-CM | POA: Diagnosis not present

## 2018-07-22 DIAGNOSIS — Z7982 Long term (current) use of aspirin: Secondary | ICD-10-CM | POA: Diagnosis not present

## 2018-07-22 DIAGNOSIS — Z791 Long term (current) use of non-steroidal anti-inflammatories (NSAID): Secondary | ICD-10-CM | POA: Diagnosis not present

## 2018-07-22 DIAGNOSIS — Z79899 Other long term (current) drug therapy: Secondary | ICD-10-CM | POA: Diagnosis not present

## 2018-07-22 NOTE — Progress Notes (Signed)
Daily Session Note  Patient Details  Name: Christine Serrano MRN: [id number] Date of Birth: 1961-03-05 Referring Provider:     Pulmonary Rehab from 07/20/2018 in [facility]  Referring Provider  Louretta Parma MD      Encounter Date: 07/22/2018  Check In: Session Check In - 07/22/18 1133      Check-In   Supervising physician immediately available to respond to emergencies  [facility] immediately available ER MD    Physician(s)  Dr. Jimmye Norman and Alfred Levins    Location  [facility]    Staff Present  Justin Mend Lorre Nick, MA, RCEP, CCRP, Exercise Physiologist;Debra Colon Sherryll Burger, RN BSN    Medication changes reported      No    Fall or balance concerns reported     No    Tobacco Cessation  No Change   15 cigs in last 24 hrs   Warm-up and Cool-down  Performed as group-led instruction    Resistance Training Performed  Yes    VAD Patient?  No    PAD/SET Patient?  No      Pain Assessment   Currently in Pain?  No/denies          Social History   Tobacco Use  Smoking Status Current Some Day Smoker  . Packs/day: 0.50  . Years: 47.00  . Pack years: 23.50  . Types: Cigarettes  Smokeless Tobacco Never Used  Tobacco Comment   07/20/2018 down to 1/3 ppd, working on quitting  for transplant, given fake cigarette    Goals Met:  Proper associated with RPD/PD & O2 Sat Independence with exercise equipment Exercise tolerated well No report of cardiac concerns or symptoms Strength training completed today  Goals Unmet:  Not Applicable  Comments: First full day of exercise!  Patient was oriented to gym and equipment including functions, settings, policies, and procedures.  Patient's individual exercise prescription and treatment plan were reviewed.  All starting workloads were established based on the results of the 6 minute walk test done at initial orientation visit.  The plan for exercise progression was also introduced and  progression will be customized based on patient's performance and goals.    Dr. Emily Filbert is Medical Director for [facility] and [facility].

## 2018-07-27 DIAGNOSIS — J449 Chronic obstructive pulmonary disease, unspecified: Secondary | ICD-10-CM

## 2018-07-27 NOTE — Progress Notes (Signed)
Pulmonary Individual Treatment Plan  Patient Details  Name: Christine Serrano MRN: [id number] Date of Birth: 1960-06-28 Referring Provider:     Pulmonary Rehab from 07/20/2018 in [facility] Cardiac and Pulmonary Rehab  Referring Provider  Louretta Parma MD      Initial Encounter Date:    Pulmonary Rehab from 07/20/2018 in [facility] Cardiac and Pulmonary Rehab  Date  07/20/18      Visit Diagnosis: COPD, severe ([place])  Patient's Home Medications on Admission:  Current Outpatient Medications:  .  aspirin EC 81 MG tablet, Take 81 mg by mouth daily., Disp: , Rfl:  .  benzonatate (TESSALON) 100 MG capsule, Take 1 capsule (100 mg total) by mouth 2 (two) times daily as needed for cough., Disp: 30 capsule, Rfl: 3 .  Cholecalciferol (VITAMIN D) 2000 units tablet, Take 2,000 Units by mouth daily., Disp: , Rfl:  .  clotrimazole (LOTRIMIN) 1 % cream, Apply 1 application topically 2 (two) times daily. For 2 weeks to rash (Patient not taking: Reported on 07/20/2018), Disp: 60 g, Rfl: 0 .  fluticasone (FLONASE) 50 MCG/ACT nasal spray, Place 2 sprays into both nostrils daily., Disp: 16 g, Rfl: 3 .  gabapentin (NEURONTIN) 100 MG capsule, Take one tablet nightly for 4 days then twice daily for 4 days then three times daily (Patient not taking: Reported on 07/20/2018), Disp: 90 capsule, Rfl: 3 .  hydrOXYzine (ATARAX/VISTARIL) 25 MG tablet, TAKE 1 TABLET (25 MG TOTAL) BY MOUTH 2 (TWO) TIMES DAILY AS NEEDED FOR ANXIETY., Disp: 180 tablet, Rfl: 0 .  ipratropium-albuterol (DUONEB) 0.5-2.5 (3) MG/3ML SOLN, Inhale 3 mLs into the lungs every 6 (six) hours as needed., Disp: 360 mL, Rfl: 3 .  LINZESS 290 MCG CAPS capsule, Take 1 capsule (290 mcg total) by mouth daily., Disp: 90 capsule, Rfl: 3 .  Multiple Vitamins-Minerals (MULTIVITAMIN ADULT) TABS, Take 1 tablet by mouth daily., Disp: , Rfl:  .  Na Sulfate-K Sulfate-Mg Sulf (SUPREP BOWEL PREP KIT) 17.5-3.13-1.6 GM/177ML SOLN, Take 1 kit by mouth as directed. (Patient not taking:  Reported on 07/20/2018), Disp: 324 mL, Rfl: 0 .  naproxen (NAPROSYN) 375 MG tablet, TAKE 1 TABLET (375 MG TOTAL) BY MOUTH 2 (TWO) TIMES DAILY AS NEEDED FOR MODERATE PAIN., Disp: 40 tablet, Rfl: 1 .  OXYGEN, Inhale 3 L/min into the lungs continuous., Disp: , Rfl:  .  sertraline (ZOLOFT) 100 MG tablet, Take 1.5 tablets (150 mg total) by mouth daily., Disp: 135 tablet, Rfl: 3 .  SYMBICORT 160-4.5 MCG/ACT inhaler, Inhale 2 puffs into the lungs 2 (two) times daily., Disp: 3 Inhaler, Rfl: 3 .  tiotropium (SPIRIVA) 18 MCG inhalation capsule, Place 1 capsule (18 mcg total) into inhaler and inhale daily., Disp: 90 capsule, Rfl: 4 .  traZODone (DESYREL) 50 MG tablet, Take 0.5 tablets (25 mg total) by mouth at bedtime., Disp: 90 tablet, Rfl: 1 .  VENTOLIN HFA 108 (90 Base) MCG/ACT inhaler, Inhale 2 puffs into the lungs every 6 (six) hours as needed for wheezing or shortness of breath., Disp: 54 g, Rfl: 1 .  vitamin B-12 (CYANOCOBALAMIN) 1000 MCG tablet, Take 1,000 mcg by mouth daily., Disp: , Rfl:  .  vitamin E 400 UNIT capsule, Take 400 Units by mouth daily. , Disp: , Rfl:   Past Medical History: Past Medical History:  Diagnosis Date  . Cervical cancer ([place])   . Chronic idiopathic constipation   . COPD (chronic obstructive pulmonary disease) ([place])   . Depression   . Endometriosis   . HLD (  hyperlipidemia)     Tobacco Use: Social History   Tobacco Use  Smoking Status Current Some Day Smoker  . Packs/day: 0.50  . Years: 47.00  . Pack years: 23.50  . Types: Cigarettes  Smokeless Tobacco Never Used  Tobacco Comment   07/20/2018 down to 1/3 ppd, working on quitting  for transplant, given fake cigarette    Labs: Recent Review Flowsheet Data    Labs for ITP Cardiac and Pulmonary Rehab Latest Ref Rng & Units 04/04/2009 04/09/2016 02/05/2017 02/06/2018   Cholestrol 0 - 200 mg/dL - - 207(H) 181   LDLCALC 0 - 99 mg/dL - - 137(H) 105(H)   HDL >39.00 mg/dL - - 43.60 45.30   Trlycerides 0.0 - 149.0 mg/dL  - - 133.0 153.0(H)   PHART 7.350 - 7.450 7.302(L) 7.30(L) - -   PCO2ART 32.0 - 48.0 mmHg 59.5 CRITICAL RESULT CALLED TO, READ BACK BY AND VERIFIED WITH: [facility] 04/04/09 AT 1440 CRITICAL RESULT CALLED TO, READ BACK BY AND VERIFIED WITH: [facility] 04/04/09 AT 1440 BY BROADNAX,L.RRT(HH) 65(H) - -   HCO3 20.0 - 28.0 mmol/L 28.5(H) 32.0(H) - -   TCO2 0 - 100 mmol/L 25.9 - - -   O2SAT % 95.5 88.7 - -       Pulmonary Assessment Scores: Pulmonary Assessment Scores    Row Name 07/20/18 1652 07/22/18 1148       ADL UCSD   ADL Phase  Entry pt will bring paperwork back first day of exericse  Entry    SOB Score total  -  86    Rest  -  3    Walk  -  3    Stairs  -  5    Bath  -  4    Dress  -  2    Shop  -  5      CAT Score   CAT Score  -  26      mMRC Score   mMRC Score  4  -       Pulmonary Function Assessment: Pulmonary Function Assessment - 07/20/18 1646      Initial Spirometry Results   FVC%  48 %    FEV1%  31 %    FEV1/FVC Ratio  53    Comments  Test date 01/15/18      Post Bronchodilator Spirometry Results   FVC%  51 %    FEV1%  29 %    FEV1/FVC Ratio  46    Comments  Test date 01/15/18      Breath   Bilateral Breath Sounds  Wheezes    Shortness of Breath  Yes;Limiting activity;Fear of Shortness of Breath       Exercise Target Goals: Exercise Program Goal: Individual exercise prescription set using results from initial 6 min walk test and THRR while considering  patient's activity barriers and safety.   Exercise Prescription Goal: Initial exercise prescription builds to 30-45 minutes a day of aerobic activity, 2-3 days per week.  Home exercise guidelines will be given to patient during program as part of exercise prescription that the participant will acknowledge.  Activity Barriers & Risk Stratification: Activity Barriers & Cardiac Risk Stratification - 07/20/18 1639      Activity Barriers & Cardiac Risk Stratification   Activity Barriers  Balance  Concerns;Deconditioning;Muscular Weakness;Shortness of Breath;Back Problems   Hx of low back fracture, osteoprosis in hips and back      6 Minute Walk: 6 Minute Walk  Row Name 07/20/18 1637         6 Minute Walk   Phase  Initial     Distance  800 feet     Walk Time  4.5 minutes     # of Rest Breaks  1 1:30     MPH  2.02     METS  3.2     RPE  19     Perceived Dyspnea   4     VO2 Peak  11.19     Symptoms  Yes (comment)     Comments  SOB     Resting HR  90 bpm     Resting BP  100/60     Resting Oxygen Saturation   93 %     Exercise Oxygen Saturation  during 6 min walk  83 %     Max Ex. HR  133 bpm     Max Ex. BP  118/70     2 Minute Post BP  102/68       Interval HR   1 Minute HR  109     2 Minute HR  120     3 Minute HR  128     4 Minute HR  133     5 Minute HR  117     6 Minute HR  126     2 Minute Post HR  100     Interval Heart Rate?  Yes       Interval Oxygen   Interval Oxygen?  Yes     Baseline Oxygen Saturation %  93 %     1 Minute Oxygen Saturation %  90 %     1 Minute Liters of Oxygen  3 L pulsed     2 Minute Oxygen Saturation %  90 %     2 Minute Liters of Oxygen  3 L     3 Minute Oxygen Saturation %  85 %     3 Minute Liters of Oxygen  3 L     4 Minute Oxygen Saturation %  83 % resting     4 Minute Liters of Oxygen  3 L     5 Minute Oxygen Saturation %  92 %     5 Minute Liters of Oxygen  3 L     6 Minute Oxygen Saturation %  91 %     6 Minute Liters of Oxygen  3 L     2 Minute Post Oxygen Saturation %  92 %     2 Minute Post Liters of Oxygen  3 L       Oxygen Initial Assessment: Oxygen Initial Assessment - 07/20/18 1541      Home Oxygen   Home Oxygen Device  Home Concentrator;Portable Concentrator;E-Tanks    Sleep Oxygen Prescription  Continuous    Liters per minute  3    Home Exercise Oxygen Prescription  Pulsed    Liters per minute  3    Home at Rest Exercise Oxygen Prescription  Continuous    Liters per minute  3    Compliance with  Home Oxygen Use  Yes      Initial 6 min Walk   Oxygen Used  Pulsed    Liters per minute  3      Program Oxygen Prescription   Program Oxygen Prescription  Continuous;E-Tanks    Liters per minute  3      Intervention   Short Term Goals  To learn and exhibit compliance with exercise, home and travel O2 prescription;To learn and understand importance of monitoring SPO2 with pulse oximeter and demonstrate accurate use of the pulse oximeter.;To learn and understand importance of maintaining oxygen saturations>88%;To learn and demonstrate proper pursed lip breathing techniques or other breathing techniques.;To learn and demonstrate proper use of respiratory medications    Long  Term Goals  Exhibits compliance with exercise, home and travel O2 prescription;Verbalizes importance of monitoring SPO2 with pulse oximeter and return demonstration;Maintenance of O2 saturations>88%;Exhibits proper breathing techniques, such as pursed lip breathing or other method taught during program session;Compliance with respiratory medication;Demonstrates proper use of MDI's       Oxygen Re-Evaluation: Oxygen Re-Evaluation    Row Name 07/22/18 1135             Goals/Expected Outcomes   Comments  Reviewed PLB technique with pt.  Talked about how it work and it's important to maintaining his exercise saturations.         Goals/Expected Outcomes  Short: Become more profiecient at using PLB.   Long: Become independent at using PLB.          Oxygen Discharge (Final Oxygen Re-Evaluation): Oxygen Re-Evaluation - 07/22/18 1135      Goals/Expected Outcomes   Comments  Reviewed PLB technique with pt.  Talked about how it work and it's important to maintaining his exercise saturations.      Goals/Expected Outcomes  Short: Become more profiecient at using PLB.   Long: Become independent at using PLB.       Initial Exercise Prescription: Initial Exercise Prescription - 07/20/18 1600      Date of Initial Exercise  RX and Referring Provider   Date  07/20/18    Referring Provider  Louretta Parma MD      Oxygen   Oxygen  Continuous    Liters  3      Treadmill   MPH  1.7    Grade  0.5    Minutes  15    METs  2.42      NuStep   Level  1    SPM  80    Minutes  15    METs  2      REL-XR   Level  1    Speed  50    Minutes  15    METs  2      Prescription Details   Frequency (times per week)  3    Duration  Progress to 45 minutes of aerobic exercise without signs/symptoms of physical distress      Intensity   THRR 40-80% of Max Heartrate  118-147    Ratings of Perceived Exertion  11-13    Perceived Dyspnea  0-4      Progression   Progression  Continue to progress workloads to maintain intensity without signs/symptoms of physical distress.      Resistance Training   Training Prescription  Yes    Weight  3 lbs    Reps  10-15       Perform Capillary Blood Glucose checks as needed.  Exercise Prescription Changes: Exercise Prescription Changes    Row Name 07/20/18 1600             Response to Exercise   Blood Pressure (Admit)  100/60       Blood Pressure (Exercise)  118/70       Blood Pressure (Exit)  102/68       Heart Rate (Admit)  90 bpm       Heart Rate (Exercise)  133 bpm       Heart Rate (Exit)  100 bpm       Oxygen Saturation (Admit)  93 %       Oxygen Saturation (Exercise)  83 %       Oxygen Saturation (Exit)  92 %       Rating of Perceived Exertion (Exercise)  19       Perceived Dyspnea (Exercise)  4       Symptoms  SOB       Comments  walk test results          Exercise Comments: Exercise Comments    Row Name 07/22/18 1134           Exercise Comments  First full day of exercise!  Patient was oriented to gym and equipment including functions, settings, policies, and procedures.  Patient's individual exercise prescription and treatment plan were reviewed.  All starting workloads were established based on the results of the 6 minute walk test done at  initial orientation visit.  The plan for exercise progression was also introduced and progression will be customized based on patient's performance and goals.          Exercise Goals and Review: Exercise Goals    Row Name 07/20/18 1643             Exercise Goals   Increase Physical Activity  Yes       Intervention  Provide advice, education, support and counseling about physical activity/exercise needs.;Develop an individualized exercise prescription for aerobic and resistive training based on initial evaluation findings, risk stratification, comorbidities and participant's personal goals.       Expected Outcomes  Short Term: Attend rehab on a regular basis to increase amount of physical activity.;Long Term: Add in home exercise to make exercise part of routine and to increase amount of physical activity.;Long Term: Exercising regularly at least 3-5 days a week.       Increase Strength and Stamina  Yes       Intervention  Provide advice, education, support and counseling about physical activity/exercise needs.;Develop an individualized exercise prescription for aerobic and resistive training based on initial evaluation findings, risk stratification, comorbidities and participant's personal goals.       Expected Outcomes  Short Term: Increase workloads from initial exercise prescription for resistance, speed, and METs.;Long Term: Improve cardiorespiratory fitness, muscular endurance and strength as measured by increased METs and functional capacity (6MWT);Short Term: Perform resistance training exercises routinely during rehab and add in resistance training at home       Able to understand and use rate of perceived exertion (RPE) scale  Yes       Intervention  Provide education and explanation on how to use RPE scale       Expected Outcomes  Short Term: Able to use RPE daily in rehab to express subjective intensity level;Long Term:  Able to use RPE to guide intensity level when exercising  independently       Able to understand and use Dyspnea scale  Yes       Intervention  Provide education and explanation on how to use Dyspnea scale       Expected Outcomes  Short Term: Able to use Dyspnea scale daily in rehab to express subjective sense of shortness of breath during exertion;Long Term: Able to use Dyspnea scale to guide intensity level when exercising independently  Knowledge and understanding of Target Heart Rate Range (THRR)  Yes       Intervention  Provide education and explanation of THRR including how the numbers were predicted and where they are located for reference       Expected Outcomes  Short Term: Able to state/look up THRR;Short Term: Able to use daily as guideline for intensity in rehab;Long Term: Able to use THRR to govern intensity when exercising independently       Able to check pulse independently  Yes       Intervention  Provide education and demonstration on how to check pulse in carotid and radial arteries.;Review the importance of being able to check your own pulse for safety during independent exercise       Expected Outcomes  Short Term: Able to explain why pulse checking is important during independent exercise;Long Term: Able to check pulse independently and accurately       Understanding of Exercise Prescription  Yes       Intervention  Provide education, explanation, and written materials on patient's individual exercise prescription       Expected Outcomes  Short Term: Able to explain program exercise prescription;Long Term: Able to explain home exercise prescription to exercise independently          Exercise Goals Re-Evaluation : Exercise Goals Re-Evaluation    [place] Name 07/22/18 1134             Exercise Goal Re-Evaluation   Exercise Goals Review  Increase Physical Activity;Increase Strength and Stamina;Able to understand and use Dyspnea scale;Able to understand and use rate of perceived exertion (RPE) scale;Knowledge and understanding of  Target Heart Rate Range (THRR);Understanding of Exercise Prescription       Comments  Reviewed RPE scale, THR and program prescription with pt today.  Pt voiced understanding and was given a copy of goals to take home.        Expected Outcomes  Short: Use RPE daily to regulate intensity. Long: Follow program prescription in THR.          Discharge Exercise Prescription (Final Exercise Prescription Changes): Exercise Prescription Changes - 07/20/18 1600      Response to Exercise   Blood Pressure (Admit)  100/60    Blood Pressure (Exercise)  118/70    Blood Pressure (Exit)  102/68    Heart Rate (Admit)  90 bpm    Heart Rate (Exercise)  133 bpm    Heart Rate (Exit)  100 bpm    Oxygen Saturation (Admit)  93 %    Oxygen Saturation (Exercise)  83 %    Oxygen Saturation (Exit)  92 %    Rating of Perceived Exertion (Exercise)  19    Perceived Dyspnea (Exercise)  4    Symptoms  SOB    Comments  walk test results       Nutrition:  Target Goals: Understanding of nutrition guidelines, daily intake of sodium '1500mg'$ , cholesterol '200mg'$ , calories 30% from fat and 7% or less from saturated fats, daily to have 5 or more servings of fruits and vegetables.  Biometrics: Pre Biometrics - 07/20/18 1645      Pre Biometrics   Height  5' 4.5" (1.638 m)    Weight  115 lb 14.4 oz (52.6 kg)    Waist Circumference  32 inches    Hip Circumference  37 inches    Waist to Hip Ratio  0.86 %    BMI (Calculated)  19.59  Nutrition Therapy Plan and Nutrition Goals: Nutrition Therapy & Goals - 07/20/18 1551      Intervention Plan   Intervention  Prescribe, educate and counsel regarding individualized specific dietary modifications aiming towards targeted core components such as weight, hypertension, lipid management, diabetes, heart failure and other comorbidities.;Nutrition handout(s) given to patient.    Expected Outcomes  Short Term Goal: Understand basic principles of dietary content, such as  calories, fat, sodium, cholesterol and nutrients.;Long Term Goal: Adherence to prescribed nutrition plan.       Nutrition Assessments: Nutrition Assessments - 07/22/18 1147      MEDFICTS Scores   Pre Score  67       Nutrition Goals Re-Evaluation:   Nutrition Goals Discharge (Final Nutrition Goals Re-Evaluation):   Psychosocial: Target Goals: Acknowledge presence or absence of significant depression and/or stress, maximize coping skills, provide positive support system. Participant is able to verbalize types and ability to use techniques and skills needed for reducing stress and depression.   Initial Review & Psychosocial Screening: Initial Psych Review & Screening - 07/20/18 1548      Initial Review   Current issues with  Current Depression;History of Depression;Current Sleep Concerns;Current Stress Concerns;Current Psychotropic Meds    Source of Stress Concerns  Chronic Illness;Unable to perform yard/household activities;Unable to participate in former interests or hobbies    Comments  Working on quitting smoking, still sleeping more and has trouble with sleeping, she has moved bedrooms. She will need to get worked up for transplant      [place]?  No    Strains  Intra-family strains;Illness and family care strain    Concerns  No support system    Comments  Her fiance is doing better, but still not in good place. She relies on her animals as well.       Barriers   Psychosocial barriers to participate in program  The patient should benefit from training in stress management and relaxation.;Psychosocial barriers identified (see note)      Screening Interventions   Interventions  Program counselor consult;Encouraged to exercise;To provide support and resources with identified psychosocial needs;Provide feedback about the scores to participant    Expected Outcomes  Short Term goal: Utilizing psychosocial counselor, staff and physician to assist with  identification of specific Stressors or current issues interfering with healing process. Setting desired goal for each stressor or current issue identified.;Long Term Goal: Stressors or current issues are controlled or eliminated.;Short Term goal: Identification and review with participant of any Quality of Life or Depression concerns found by scoring the questionnaire.;Long Term goal: The participant improves quality of Life and PHQ9 Scores as seen by post scores and/or verbalization of changes       Quality of Life Scores:  Scores of 19 and below usually indicate a poorer quality of life in these areas.  A difference of  2-3 points is a clinically meaningful difference.  A difference of 2-3 points in the total score of the Quality of Life Index has been associated with significant improvement in overall quality of life, self-image, physical symptoms, and general health in studies assessing change in quality of life.  PHQ-9: Recent Review Flowsheet Data    Depression screen [facility] 2/9 07/20/2018 03/09/2018 02/06/2018 08/18/2017 07/07/2017   Decreased Interest '3 2 1 1 3   '$ Down, Depressed, Hopeless '3 3 3 '$ 0 2   PHQ - 2 Score '6 5 4 1 5   '$ Altered sleeping '3 3 1 '$ 1  3   Tired, decreased energy '3 3 3 1 3   '$ Change in appetite '3 2 1 '$ 0 2   Feeling bad or failure about yourself  '2 1 1 '$ 0 0   Trouble concentrating 3 1 0 1 1   Moving slowly or fidgety/restless 0 1 0 0 1   Suicidal thoughts 0 0 0 0 0   PHQ-9 Score '20 16 10 4 15   '$ Difficult doing work/chores Very difficult Extremely dIfficult Very difficult - -     Interpretation of Total Score  Total Score Depression Severity:  1-4 = Minimal depression, 5-9 = Mild depression, 10-14 = Moderate depression, 15-19 = Moderately severe depression, 20-27 = Severe depression   Psychosocial Evaluation and Intervention: Psychosocial Evaluation - 07/22/18 1225      Psychosocial Evaluation & Interventions   Interventions  Stress management education;Relaxation  education;Encouraged to exercise with the program and follow exercise prescription    Comments  Ms. Sharlet Salina Manuela Schwartz) has returned to this class after 2 years due to continued COPD problems.  She is a 58 year old who recently lost her significant other to suicide.  She has multiple health issues as well.  Minela states she sleeps "too much" and has no energy.  She has a limited appetite as well and both of these has been experiencing these symptoms more the past 5-6 months.  She has a history of depression and anxiety and is on medications at this time.  She reports her mood is depressed most of the time.  She has multiple stressors with her losses; finances; and her health issues.  She has goals to quit smoking and be able to breathe better while in this program.  Counselor encouraged therapy and a medication evaluation with Avaiah reporting having an appointment with a therapist beginning 2/25.  Her medications may need to be re-evaluated since her PHQ-9 score is a "20" indicating Severe symptoms of depression are present currently.  She agreed to speak with her Dr. about this.   Tamaria denies any suicidal or homicidal ideations at this time.   Staff will follow with her.      Expected Outcomes   Short:  Philana will speak with her Dr. about evaluating her medications for depression and anxiety.  She will meet with her counselor weekly.  This counselor connected her to staff for a gas card to help with her finances and consistency in attendance.  She will meet with staff to develop a smoking cessation plan.    Long:  Symphony will develop healthier coping strategies to deal with stress and losses.      Continue Psychosocial Services   Follow up required by staff       Psychosocial Re-Evaluation:   Psychosocial Discharge (Final Psychosocial Re-Evaluation):   Education: Education Goals: Education classes will be provided on a weekly basis, covering required topics. Participant will state understanding/return  demonstration of topics presented.  Learning Barriers/Preferences:   Education Topics:  Initial Evaluation Education: - Verbal, written and demonstration of respiratory meds, oximetry and breathing techniques. Instruction on use of nebulizers and MDIs and importance of monitoring MDI activations.   Pulmonary Rehab from 07/22/2018 in [facility] Cardiac and Pulmonary Rehab  Date  07/20/18  Educator  [facility]  Instruction Review Code  1- Verbalizes Understanding      General Nutrition Guidelines/Fats and Fiber: -Group instruction provided by verbal, written material, models and posters to present the general guidelines for heart healthy nutrition. Gives an explanation and review  of dietary fats and fiber.   Controlling Sodium/Reading Food Labels: -Group verbal and written material supporting the discussion of sodium use in heart healthy nutrition. Review and explanation with models, verbal and written materials for utilization of the food label.   Exercise Physiology & General Exercise Guidelines: - Group verbal and written instruction with models to review the exercise physiology of the cardiovascular system and associated critical values. Provides general exercise guidelines with specific guidelines to those with heart or lung disease.    Aerobic Exercise & Resistance Training: - Gives group verbal and written instruction on the various components of exercise. Focuses on aerobic and resistive training programs and the benefits of this training and how to safely progress through these programs.   Flexibility, Balance, Mind/Body Relaxation: Provides group verbal/written instruction on the benefits of flexibility and balance training, including mind/body exercise modes such as yoga, pilates and tai chi.  Demonstration and skill practice provided.   Stress and Anxiety: - Provides group verbal and written instruction about the health risks of elevated stress and causes of high stress.  Discuss the  correlation between heart/lung disease and anxiety and treatment options. Review healthy ways to manage with stress and anxiety.   Pulmonary Rehab from 07/22/2018 in [facility] Cardiac and Pulmonary Rehab  Date  07/22/18  Educator  [facility]  Instruction Review Code  1- Verbalizes Understanding      Depression: - Provides group verbal and written instruction on the correlation between heart/lung disease and depressed mood, treatment options, and the stigmas associated with seeking treatment.   Exercise & Equipment Safety: - Individual verbal instruction and demonstration of equipment use and safety with use of the equipment.   Pulmonary Rehab from 07/22/2018 in [facility] Cardiac and Pulmonary Rehab  Date  07/20/18  Educator  [facility]  Instruction Review Code  1- Verbalizes Understanding      Infection Prevention: - Provides verbal and written material to individual with discussion of infection control including proper hand washing and proper equipment cleaning during exercise session.   Pulmonary Rehab from 07/22/2018 in [facility] Cardiac and Pulmonary Rehab  Date  07/20/18  Educator  [facility]  Instruction Review Code  1- Verbalizes Understanding      Falls Prevention: - Provides verbal and written material to individual with discussion of falls prevention and safety.   Pulmonary Rehab from 07/22/2018 in [facility] Cardiac and Pulmonary Rehab  Date  07/20/18  Educator  [facility]  Instruction Review Code  1- Verbalizes Understanding      Diabetes: - Individual verbal and written instruction to review signs/symptoms of diabetes, desired ranges of glucose level fasting, after meals and with exercise. Advice that pre and post exercise glucose checks will be done for 3 sessions at entry of program.   Chronic Lung Diseases: - Group verbal and written instruction to review updates, respiratory medications, advancements in procedures and treatments. Discuss use of supplemental oxygen including available portable oxygen systems,  continuous and intermittent flow rates, concentrators, personal use and safety guidelines. Review proper use of inhaler and spacers. Provide informative websites for self-education.    Energy Conservation: - Provide group verbal and written instruction for methods to conserve energy, plan and organize activities. Instruct on pacing techniques, use of adaptive equipment and posture/positioning to relieve shortness of breath.   Triggers and Exacerbations: - Group verbal and written instruction to review types of environmental triggers and ways to prevent exacerbations. Discuss weather changes, air quality and the benefits of nasal washing. Review warning signs and symptoms  to help prevent infections. Discuss techniques for effective airway clearance, coughing, and vibrations.   Pulmonary Rehab from 08/07/2016 in [facility] Cardiac and Pulmonary Rehab  Date  08/07/16  Educator  [facility]  Instruction Review Code (retired)  2- meets goals/outcomes      AED/CPR: - Group verbal and written instruction with the use of models to demonstrate the basic use of the AED with the basic ABC's of resuscitation.   Anatomy and Physiology of the Lungs: - Group verbal and written instruction with the use of models to provide basic lung anatomy and physiology related to function, structure and complications of lung disease.   Anatomy & Physiology of the Heart: - Group verbal and written instruction and models provide basic cardiac anatomy and physiology, with the coronary electrical and arterial systems. Review of Valvular disease and Heart Failure   Cardiac Medications: - Group verbal and written instruction to review commonly prescribed medications for heart disease. Reviews the medication, class of the drug, and side effects.   Know Your Numbers and Risk Factors: -Group verbal and written instruction about important numbers in your health.  Discussion of what are risk factors and how they play a role in the disease  process.  Review of Cholesterol, Blood Pressure, Diabetes, and BMI and the role they play in your overall health.   Sleep Hygiene: -Provides group verbal and written instruction about how sleep can affect your health.  Define sleep hygiene, discuss sleep cycles and impact of sleep habits. Review good sleep hygiene tips.    Other: -Provides group and verbal instruction on various topics (see comments)    Knowledge Questionnaire Score: Knowledge Questionnaire Score - 07/22/18 1147      Knowledge Questionnaire Score   Pre Score  13/16   test reviewed with pt toda       Core Components/Risk Factors/Patient Goals at Admission: Personal Goals and Risk Factors at Admission - 07/20/18 1551      Core Components/Risk Factors/Patient Goals on Admission    Weight Management  Yes;Weight Maintenance;Weight Gain    Intervention  Weight Management: Develop a combined nutrition and exercise program designed to reach desired caloric intake, while maintaining appropriate intake of nutrient and fiber, sodium and fats, and appropriate energy expenditure required for the weight goal.;Weight Management: Provide education and appropriate resources to help participant work on and attain dietary goals.    Admit Weight  115 lb 14.4 oz (52.6 kg)    Goal Weight: Short Term  120 lb (54.4 kg)    Goal Weight: Long Term  125 lb (56.7 kg)    Expected Outcomes  Weight Gain: Understanding of general recommendations for a high calorie, high protein meal plan that promotes weight gain by distributing calorie intake throughout the day with the consumption for 4-5 meals, snacks, and/or supplements;Short Term: Continue to assess and modify interventions until short term weight is achieved;Long Term: Adherence to nutrition and physical activity/exercise program aimed toward attainment of established weight goal;Understanding recommendations for meals to include 15-35% energy as protein, 25-35% energy from fat, 35-60% energy  from carbohydrates, less than '200mg'$  of dietary cholesterol, 20-35 gm of total fiber daily;Understanding of distribution of calorie intake throughout the day with the consumption of 4-5 meals/snacks    Tobacco Cessation  Yes    Number of packs per day  0.3    Intervention  Assist the participant in steps to quit. Provide individualized education and counseling about committing to Tobacco Cessation, relapse prevention, and pharmacological support that can be  provided by physician.;Advice worker, assist with locating and accessing local/national Quit Smoking programs, and support quit date choice.    Expected Outcomes  Short Term: Will demonstrate readiness to quit, by selecting a quit date.;Short Term: Will quit all tobacco product use, adhering to prevention of relapse plan.;Long Term: Complete abstinence from all tobacco products for at least 12 months from quit date.    Improve shortness of breath with ADL's  Yes    Intervention  Provide education, individualized exercise plan and daily activity instruction to help decrease symptoms of SOB with activities of daily living.    Expected Outcomes  Short Term: Improve cardiorespiratory fitness to achieve a reduction of symptoms when performing ADLs;Long Term: Be able to perform more ADLs without symptoms or delay the onset of symptoms    Intervention  Provide education and support for participant on nutrition & aerobic/resistive exercise along with prescribed medications to achieve LDL '70mg'$ , HDL >'40mg'$ .    Expected Outcomes  Short Term: Participant states understanding of desired cholesterol values and is compliant with medications prescribed. Participant is following exercise prescription and nutrition guidelines.;Long Term: Cholesterol controlled with medications as prescribed, with individualized exercise RX and with personalized nutrition plan. Value goals: LDL < '70mg'$ , HDL > 40 mg.       Core Components/Risk Factors/Patient Goals Review:     Core Components/Risk Factors/Patient Goals at Discharge (Final Review):    ITP Comments: ITP Comments    Row Name 07/20/18 1636 07/27/18 0837         ITP Comments  Medical evaluation and walk test completed today.  Documentation for diagnosis can be found in [facility] encounter from 05/11/18.  Initial ITP created and sent for review to Dr. Emily Filbert, Medical Director.   30 day review completed. ITP sent to Dr. Emily Filbert Director of [place]. Continue with ITP unless changes are made by physician.         Comments: 30 day review

## 2018-07-29 ENCOUNTER — Encounter: Payer: Medicare Other | Admitting: *Deleted

## 2018-07-29 ENCOUNTER — Ambulatory Visit: Payer: Self-pay | Admitting: Family Medicine

## 2018-07-29 ENCOUNTER — Encounter: Payer: Self-pay | Admitting: *Deleted

## 2018-07-29 DIAGNOSIS — Z79899 Other long term (current) drug therapy: Secondary | ICD-10-CM | POA: Diagnosis not present

## 2018-07-29 DIAGNOSIS — J449 Chronic obstructive pulmonary disease, unspecified: Secondary | ICD-10-CM

## 2018-07-29 DIAGNOSIS — Z7982 Long term (current) use of aspirin: Secondary | ICD-10-CM | POA: Diagnosis not present

## 2018-07-29 DIAGNOSIS — Z791 Long term (current) use of non-steroidal anti-inflammatories (NSAID): Secondary | ICD-10-CM | POA: Diagnosis not present

## 2018-07-29 DIAGNOSIS — Z9981 Dependence on supplemental oxygen: Secondary | ICD-10-CM | POA: Diagnosis not present

## 2018-07-29 NOTE — Progress Notes (Signed)
Daily Session Note  Patient Details  Name: Christine Serrano MRN: [id number] Date of Birth: 05-Jul-1960 Referring Provider:     Pulmonary Rehab from 07/20/2018 in [facility] Cardiac and Pulmonary Rehab  Referring Provider  Louretta Parma MD      Encounter Date: 07/29/2018  Check In: Session Check In - 07/29/18 1008      Check-In   Supervising physician immediately available to respond to emergencies  [facility] immediately available ER MD    Physician(s)  Dr. Kerman Passey and Mariea Clonts    Location  [facility]-Cardiac & Pulmonary Rehab    Staff Present  Renita Papa, RN BSN;Jessica Luan Pulling, MA, RCEP, CCRP, Exercise Physiologist;Joseph Tessie Fass RCP,RRT,BSRT    Medication changes reported      No    Fall or balance concerns reported     No    Tobacco Cessation  Use Decreased   Pt stated she quit smoking on Sat 2/8   Warm-up and Cool-down  Performed as group-led instruction    Resistance Training Performed  Yes    VAD Patient?  No    PAD/SET Patient?  No      Pain Assessment   Currently in Pain?  No/denies          Social History   Tobacco Use  Smoking Status Former Smoker  . Packs/day: 0.50  . Years: 47.00  . Pack years: 23.50  . Types: Cigarettes  . Last attempt to quit: 07/25/2018  . Years since quitting: 0.0  Smokeless Tobacco Never Used  Tobacco Comment   07/28/18 quit smoking on 07/25/18    Goals Met:  Proper associated with RPD/PD & O2 Sat Independence with exercise equipment Exercise tolerated well No report of cardiac concerns or symptoms Strength training completed today  Goals Unmet:  Not Applicable  Comments: Pt able to follow exercise prescription today without complaint.  Will continue to monitor for progression.  Reviewed home exercise with pt today.  Pt plans to walk at home for exercise.  Reviewed THR, pulse, RPE, sign and symptoms, and when to call 911 or MD.  Also discussed weather considerations and indoor options.  Pt voiced understanding.   Dr. Emily Filbert is  Medical Director for [place] and [facility].

## 2018-08-05 DIAGNOSIS — Z791 Long term (current) use of non-steroidal anti-inflammatories (NSAID): Secondary | ICD-10-CM | POA: Diagnosis not present

## 2018-08-05 DIAGNOSIS — J449 Chronic obstructive pulmonary disease, unspecified: Secondary | ICD-10-CM | POA: Diagnosis not present

## 2018-08-05 DIAGNOSIS — Z7982 Long term (current) use of aspirin: Secondary | ICD-10-CM | POA: Diagnosis not present

## 2018-08-05 DIAGNOSIS — Z79899 Other long term (current) drug therapy: Secondary | ICD-10-CM | POA: Diagnosis not present

## 2018-08-05 DIAGNOSIS — Z9981 Dependence on supplemental oxygen: Secondary | ICD-10-CM | POA: Diagnosis not present

## 2018-08-05 NOTE — Progress Notes (Signed)
Daily Session Note  Patient Details  Name: Christine Serrano MRN: [id number] Date of Birth: July 02, 1960 Referring Provider:     Pulmonary Rehab from 07/20/2018 in [facility] Cardiac and Pulmonary Rehab  Referring Provider  Louretta Parma MD      Encounter Date: 08/05/2018  Check In: Session Check In - 08/05/18 1022      Check-In   Supervising physician immediately available to respond to emergencies  [facility] immediately available ER MD    Physician(s)  Joni Fears and Alfred Levins    Location  [facility]-Cardiac & Pulmonary Rehab    Staff Present  Alberteen Sam, MA, RCEP, CCRP, Exercise Physiologist;Joseph Foy [place], [place], ACSM CEP, Exercise Physiologist    Medication changes reported      No    Fall or balance concerns reported     No    Warm-up and Cool-down  Performed as group-led instruction    Resistance Training Performed  Yes    VAD Patient?  No    PAD/SET Patient?  No      Pain Assessment   Currently in Pain?  No/denies    Multiple Pain Sites  No          Social History   Tobacco Use  Smoking Status Former Smoker  . Packs/day: 0.50  . Years: 47.00  . Pack years: 23.50  . Types: Cigarettes  . Last attempt to quit: 07/25/2018  . Years since quitting: 0.0  Smokeless Tobacco Never Used  Tobacco Comment   07/28/18 quit smoking on 07/25/18    Goals Met:  Independence with exercise equipment Exercise tolerated well No report of cardiac concerns or symptoms Strength training completed today  Goals Unmet:  Not Applicable  Comments: Pt able to follow exercise prescription today without complaint.  Will continue to monitor for progression.    Dr. Emily Filbert is Medical Director for [place] and [facility].

## 2018-08-11 ENCOUNTER — Ambulatory Visit: Payer: Self-pay | Admitting: Psychology

## 2018-08-13 ENCOUNTER — Encounter: Payer: Self-pay | Admitting: Family Medicine

## 2018-08-13 ENCOUNTER — Ambulatory Visit (INDEPENDENT_AMBULATORY_CARE_PROVIDER_SITE_OTHER): Payer: Medicare Other | Admitting: Family Medicine

## 2018-08-13 VITALS — BP 122/64 | HR 93 | Temp 97.8°F | Ht 64.5 in | Wt 121.0 lb

## 2018-08-13 DIAGNOSIS — M810 Age-related osteoporosis without current pathological fracture: Secondary | ICD-10-CM

## 2018-08-13 DIAGNOSIS — F5104 Psychophysiologic insomnia: Secondary | ICD-10-CM | POA: Diagnosis not present

## 2018-08-13 DIAGNOSIS — K5909 Other constipation: Secondary | ICD-10-CM | POA: Diagnosis not present

## 2018-08-13 DIAGNOSIS — F331 Major depressive disorder, recurrent, moderate: Secondary | ICD-10-CM

## 2018-08-13 MED ORDER — VENTOLIN HFA 108 (90 BASE) MCG/ACT IN AERS: 2.0000 | INHALATION_SPRAY | Freq: Four times a day (QID) | RESPIRATORY_TRACT | 1 refills | Status: DC | PRN

## 2018-08-13 MED ORDER — BUPROPION HCL ER (SR) 100 MG PO TB12: 100.0000 mg | ORAL_TABLET | Freq: Every day | ORAL | 6 refills | Status: DC

## 2018-08-13 MED ORDER — LINZESS 290 MCG PO CAPS
290.0000 ug | ORAL_CAPSULE | Freq: Every day | ORAL | 3 refills | Status: DC | PRN
Start: 2018-08-13 — End: 2019-07-13

## 2018-08-13 MED ORDER — NAPROXEN 375 MG PO TABS: 375.0000 mg | ORAL_TABLET | Freq: Two times a day (BID) | ORAL | 1 refills | Status: DC | PRN

## 2018-08-13 MED ORDER — HYDROXYZINE HCL 25 MG PO TABS: 25.0000 mg | ORAL_TABLET | Freq: Two times a day (BID) | ORAL | 0 refills | Status: DC | PRN

## 2018-08-13 MED ORDER — BENZONATATE 100 MG PO CAPS: 100.0000 mg | ORAL_CAPSULE | Freq: Two times a day (BID) | ORAL | 1 refills | Status: DC | PRN

## 2018-08-13 MED ORDER — TIOTROPIUM BROMIDE MONOHYDRATE 18 MCG IN CAPS: 18.0000 ug | ORAL_CAPSULE | Freq: Every day | RESPIRATORY_TRACT | 4 refills | Status: DC

## 2018-08-13 MED ORDER — IPRATROPIUM-ALBUTEROL 0.5-2.5 (3) MG/3ML IN SOLN: 3.0000 mL | Freq: Four times a day (QID) | RESPIRATORY_TRACT | 3 refills | Status: DC | PRN

## 2018-08-13 MED ORDER — SYMBICORT 160-4.5 MCG/ACT IN AERO: 2.0000 | INHALATION_SPRAY | Freq: Two times a day (BID) | RESPIRATORY_TRACT | 3 refills | Status: DC

## 2018-08-13 MED ORDER — FLUTICASONE PROPIONATE 50 MCG/ACT NA SUSP
2.0000 | Freq: Every day | NASAL | 3 refills | Status: DC
Start: 2018-08-13 — End: 2019-07-12

## 2018-08-13 MED ORDER — TRAZODONE HCL 50 MG PO TABS: 50.0000 mg | ORAL_TABLET | Freq: Every day | ORAL | 3 refills | Status: DC

## 2018-08-13 MED ORDER — GABAPENTIN 100 MG PO CAPS: 100.0000 mg | ORAL_CAPSULE | Freq: Two times a day (BID) | ORAL | 3 refills | Status: DC

## 2018-08-13 NOTE — Assessment & Plan Note (Signed)
Ongoing struggle - feels depression not well controlled. Interested in adjuvant medication. Will start wellbutrin SR 100mg  daily in the morning, reviewed side effects to monitor for including restless energy. Hopeful to help with smoking cessation. Continue sertraline 150mg  daily, continue trazodone 50mg  nightly.

## 2018-08-13 NOTE — Patient Instructions (Addendum)
Continue sertraline 150mg  daily. Continue trazodone 50mg  at night time.  Add wellbutrin second antidepressant daily in the morning 100mg .  Return in 4-6 weeks for follow up visit.

## 2018-08-13 NOTE — Progress Notes (Signed)
BP 122/64 (BP Location: Right Arm, Patient Position: Sitting, Cuff Size: Normal)   Pulse 93   Temp 97.8 F (36.6 C) (Oral)   Ht 5' 4.5" (1.638 m)   Wt 121 lb (54.9 kg)   SpO2 97% Comment: 3 L, pulsating  BMI 20.45 kg/m    CC: discuss med changes Subjective:    Patient ID: Christine Serrano, female    DOB: 07/08/1960, 58 y.o.   MRN: [id number]  HPI: Christine Serrano is a 58 y.o. female presenting on 08/13/2018 for Discuss Medication (Wants to discuss sertraline. Still feels depressed. Had stopped smoking but restarted. Also, pt no longer uses [employer]. Needs new rxs sent to [facility]. )   Osteoporosis - first prolia injection done 06/08/2018.   MDD - on sertraline 169m and trazodone 517mnightly. No energy, feels depressed, apathetic, anhedonia.  Previously on effexor and valium.  She had quit smoking, but has now restarted back to 6 cig/day.  No h/o seizures.  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 Not taking linzess too regluarly.      Relevant past medical, surgical, family and social history reviewed and updated as indicated. Interim medical history since our last visit reviewed. Allergies and medications reviewed and updated. Outpatient Medications Prior to Visit  Medication Sig Dispense Refill  . aspirin EC 81 MG tablet Take 81 mg by mouth daily.    . Cholecalciferol (VITAMIN D) 2000 units tablet Take 2,000 Units by mouth daily.    . clotrimazole (LOTRIMIN) 1 % cream Apply 1 application topically 2 (two) times daily. For 2 weeks to rash 60 g 0  . Multiple Vitamins-Minerals (MULTIVITAMIN ADULT) TABS Take 1 tablet by mouth daily.    . OXYGEN Inhale 3 L/min into the lungs continuous.    . sertraline (ZOLOFT) 100 MG tablet Take 1.5 tablets (150 mg total) by mouth daily. 135 tablet 3  . vitamin B-12 (CYANOCOBALAMIN) 1000 MCG tablet Take 1,000 mcg by mouth daily.    . vitamin E 400 UNIT capsule  Take 400 Units by mouth daily.     . benzonatate (TESSALON) 100 MG capsule Take 1 capsule (100 mg total) by mouth 2 (two) times daily as needed for cough. 30 capsule 3  . fluticasone (FLONASE) 50 MCG/ACT nasal spray Place 2 sprays into both nostrils daily. 16 g 3  . gabapentin (NEURONTIN) 100 MG capsule Take one tablet nightly for 4 days then twice daily for 4 days then three times daily 90 capsule 3  . hydrOXYzine (ATARAX/VISTARIL) 25 MG tablet TAKE 1 TABLET (25 MG TOTAL) BY MOUTH 2 (TWO) TIMES DAILY AS NEEDED FOR ANXIETY. 180 tablet 0  . ipratropium-albuterol (DUONEB) 0.5-2.5 (3) MG/3ML SOLN Inhale 3 mLs into the lungs every 6 (six) hours as needed. 360 mL 3  . LINZESS 290 MCG CAPS capsule Take 1 capsule (290 mcg total) by mouth daily. 90 capsule 3  . naproxen (NAPROSYN) 375 MG tablet TAKE 1 TABLET (375 MG TOTAL) BY MOUTH 2 (TWO) TIMES DAILY AS NEEDED FOR MODERATE PAIN. 40 tablet 1  . SYMBICORT 160-4.5 MCG/ACT inhaler Inhale 2 puffs into the lungs 2 (two) times daily. 3 Inhaler 3  . tiotropium (SPIRIVA) 18 MCG inhalation capsule Place 1 capsule (18 mcg total) into inhaler and inhale daily. 90 capsule 4  . traZODone (DESYREL) 50 MG tablet Take 0.5 tablets (25 mg total) by mouth at bedtime. 90 tablet 1  .  VENTOLIN HFA 108 (90 Base) MCG/ACT inhaler Inhale 2 puffs into the lungs every 6 (six) hours as needed for wheezing or shortness of breath. 54 g 1  . Na Sulfate-K Sulfate-Mg Sulf (SUPREP BOWEL PREP KIT) 17.5-3.13-1.6 GM/177ML SOLN Take 1 kit by mouth as directed. 324 mL 0   No facility-administered medications prior to visit.      Per HPI unless specifically indicated in ROS section below Review of Systems Objective:    BP 122/64 (BP Location: Right Arm, Patient Position: Sitting, Cuff Size: Normal)   Pulse 93   Temp 97.8 F (36.6 C) (Oral)   Ht 5' 4.5" (1.638 m)   Wt 121 lb (54.9 kg)   SpO2 97% Comment: 3 L, pulsating  BMI 20.45 kg/m   Wt Readings from Last 3 Encounters:  08/13/18  121 lb (54.9 kg)  07/20/18 115 lb 14.4 oz (52.6 kg)  06/01/18 115 lb 12 oz (52.5 kg)    Physical Exam Vitals signs and nursing note reviewed.  Constitutional:      Appearance: Normal appearance.     Comments:  with oxygen in place  Neurological:     Mental Status: She is alert.  Psychiatric:        Mood and Affect: Mood normal.        Behavior: Behavior normal.        Thought Content: Thought content normal.        Judgment: Judgment normal.       Results for orders placed or performed in visit on 06/05/18  VITAMIN D 25 Hydroxy (Vit-D Deficiency, Fractures)  Result Value Ref Range   Vit D, 25-Hydroxy 71 30 - 100 ng/mL  Calcium  Result Value Ref Range   Calcium 9.7 8.6 - 10.4 mg/dL   Lab Results  Component Value Date   CREATININE 0.66 02/06/2018   BUN 9 02/06/2018   NA 139 02/06/2018   K 4.2 02/06/2018   CL 99 02/06/2018   CO2 35 (H) 02/06/2018   Depression screen PHQ 2/9 08/13/2018 07/20/2018 03/09/2018 02/06/2018 08/18/2017  Decreased Interest _0 Down, Depressed, Hopeless _1 0  PHQ - 2 Score _2 Altered sleeping _3 Tired, decreased energy _4 Change in appetite _5 0  Feeling bad or failure about yourself  _6 0  Trouble concentrating _7 0 1  Moving slowly or fidgety/restless 0 0 1 0 0  Suicidal thoughts 0 0 0 0 0  PHQ-9 Score _8 Difficult doing work/chores - Very difficult Extremely dIfficult Very difficult -  Some recent data might be hidden    GAD 7 : Generalized Anxiety Score 08/13/2018 08/18/2017 07/07/2017  Nervous, Anxious, on Edge _9 Control/stop worrying 1 0 3  Worry too much - different things 1 0 3  Trouble relaxing 1 0 1  Restless 1 0 1  Easily annoyed or irritable _10 Afraid - awful might happen 1 0 0  Total GAD 7 Score _11 Assessment & Plan:  Medications refilled today.  Problem List Items Addressed This Visit    Osteoporosis   Moderate recurrent major depression ([place]) -  Primary    Ongoing struggle - feels depression not well controlled. Interested in adjuvant medication. Will start wellbutrin SR 164m daily in the morning, reviewed side  effects to monitor for including restless energy. Hopeful to help with smoking cessation. Continue sertraline 137m daily, continue trazodone 515mnightly.       Relevant Medications   hydrOXYzine (ATARAX/VISTARIL) 25 MG tablet   traZODone (DESYREL) 50 MG tablet   buPROPion (WELLBUTRIN SR) 100 MG 12 hr tablet   Chronic insomnia    Continue trazodone 5033mightly.       Chronic constipation    Stable on PRN linzess.           Meds ordered this encounter  Medications  . benzonatate (TESSALON) 100 MG capsule    Sig: Take 1 capsule (100 mg total) by mouth 2 (two) times daily as needed for cough.    Dispense:  30 capsule    Refill:  1  . gabapentin (NEURONTIN) 100 MG capsule    Sig: Take 1 capsule (100 mg total) by mouth 2 (two) times daily.    Dispense:  180 capsule    Refill:  3  . fluticasone (FLONASE) 50 MCG/ACT nasal spray    Sig: Place 2 sprays into both nostrils daily.    Dispense:  48 g    Refill:  3  . hydrOXYzine (ATARAX/VISTARIL) 25 MG tablet    Sig: Take 1 tablet (25 mg total) by mouth 2 (two) times daily as needed for anxiety.    Dispense:  180 tablet    Refill:  0  . ipratropium-albuterol (DUONEB) 0.5-2.5 (3) MG/3ML SOLN    Sig: Inhale 3 mLs into the lungs every 6 (six) hours as needed.    Dispense:  360 mL    Refill:  3  . LINZESS 290 MCG CAPS capsule    Sig: Take 1 capsule (290 mcg total) by mouth daily as needed (constipation).    Dispense:  30 capsule    Refill:  3  . SYMBICORT 160-4.5 MCG/ACT inhaler    Sig: Inhale 2 puffs into the lungs 2 (two) times daily.    Dispense:  3 Inhaler    Refill:  3  . tiotropium (SPIRIVA) 18 MCG inhalation capsule    Sig: Place 1 capsule (18 mcg total) into inhaler and inhale daily.    Dispense:  90 capsule    Refill:  4  . traZODone (DESYREL) 50 MG  tablet    Sig: Take 1 tablet (50 mg total) by mouth at bedtime.    Dispense:  90 tablet    Refill:  3  . VENTOLIN HFA 108 (90 Base) MCG/ACT inhaler    Sig: Inhale 2 puffs into the lungs every 6 (six) hours as needed for wheezing or shortness of breath.    Dispense:  54 g    Refill:  1  . buPROPion (WELLBUTRIN SR) 100 MG 12 hr tablet    Sig: Take 1 tablet (100 mg total) by mouth daily.    Dispense:  30 tablet    Refill:  6  . naproxen (NAPROSYN) 375 MG tablet    Sig: Take 1 tablet (375 mg total) by mouth 2 (two) times daily as needed for moderate pain.    Dispense:  40 tablet    Refill:  1   No orders of the defined types were placed in this encounter.   Follow up plan: Return in about 6 weeks (around 09/24/2018) for follow up visit.  JavRia BushD

## 2018-08-13 NOTE — Assessment & Plan Note (Signed)
Continue trazodone 50 mg nightly.

## 2018-08-13 NOTE — Assessment & Plan Note (Signed)
Stable on PRN linzess.

## 2018-08-17 ENCOUNTER — Encounter: Payer: Medicare Other | Attending: Pulmonary Disease | Admitting: *Deleted

## 2018-08-17 DIAGNOSIS — Z9981 Dependence on supplemental oxygen: Secondary | ICD-10-CM | POA: Diagnosis not present

## 2018-08-17 DIAGNOSIS — Z8541 Personal history of malignant neoplasm of cervix uteri: Secondary | ICD-10-CM | POA: Insufficient documentation

## 2018-08-17 DIAGNOSIS — Z7982 Long term (current) use of aspirin: Secondary | ICD-10-CM | POA: Insufficient documentation

## 2018-08-17 DIAGNOSIS — F329 Major depressive disorder, single episode, unspecified: Secondary | ICD-10-CM | POA: Diagnosis not present

## 2018-08-17 DIAGNOSIS — F17210 Nicotine dependence, cigarettes, uncomplicated: Secondary | ICD-10-CM | POA: Diagnosis not present

## 2018-08-17 DIAGNOSIS — Z791 Long term (current) use of non-steroidal anti-inflammatories (NSAID): Secondary | ICD-10-CM | POA: Diagnosis not present

## 2018-08-17 DIAGNOSIS — Z79899 Other long term (current) drug therapy: Secondary | ICD-10-CM | POA: Insufficient documentation

## 2018-08-17 DIAGNOSIS — J449 Chronic obstructive pulmonary disease, unspecified: Secondary | ICD-10-CM | POA: Diagnosis not present

## 2018-08-17 NOTE — Progress Notes (Signed)
Daily Session Note  Patient Details  Name: ABBIGAEL DETLEFSEN MRN: [id number] Date of Birth: 1960-09-28 Referring Provider:     Pulmonary Rehab from 07/20/2018 in [facility]  Referring Provider  Louretta Parma MD      Encounter Date: 08/17/2018  Check In: Session Check In - 08/17/18 1027      Check-In   Supervising physician immediately available to respond to emergencies  [facility] immediately available ER MD    Physician(s)  Dr. Joni Fears and Dr. Alfred Levins    Location  [facility]    Staff Present  Earlean Shawl, BS, ACSM CEP, Exercise Physiologist;[facility] [place], [place], ACSM CEP, Exercise Physiologist    Medication changes reported      No    Fall or balance concerns reported     No    Tobacco Cessation  No Change    Warm-up and Cool-down  Performed as group-led instruction    Resistance Training Performed  Yes    VAD Patient?  No    PAD/SET Patient?  No      Pain Assessment   Currently in Pain?  No/denies    Multiple Pain Sites  No          Social History   Tobacco Use  Smoking Status Current Every Day Smoker  . Packs/day: 0.50  . Years: 47.00  . Pack years: 23.50  . Types: Cigarettes  . Last attempt to quit: 07/25/2018  . Years since quitting: 0.0  Smokeless Tobacco Never Used  Tobacco Comment   07/28/18 quit smoking on 07/25/18    Goals Met:  Proper associated with RPD/PD & O2 Sat [place] with exercise equipment Exercise tolerated well No report of cardiac concerns or symptoms Strength training completed today  Goals Unmet:  Not Applicable  Comments: Pt able to follow exercise prescription today without complaint.  Will continue to monitor for progression.    Dr. Emily Filbert is Medical Director for [facility] and [facility].

## 2018-08-20 DIAGNOSIS — J449 Chronic obstructive pulmonary disease, unspecified: Secondary | ICD-10-CM | POA: Diagnosis not present

## 2018-08-24 DIAGNOSIS — J449 Chronic obstructive pulmonary disease, unspecified: Secondary | ICD-10-CM

## 2018-08-24 NOTE — Progress Notes (Signed)
Pulmonary Individual Treatment Plan  Patient Details  Name: Christine Serrano MRN: [id number] Date of Birth: 09/23/1960 Referring Provider:     Pulmonary Rehab from 07/20/2018 in [facility] Pulmonary Re[facility]  Referring Provider  Louretta Parma MD      Initial Encounter Date:    Pulmonary Rehab from 07/20/2018 in [facility] Cardiac and Pulmonary Rehab  Date  07/20/18      Visit Diagnosis: COPD, severe ([place])  Patient's Home Medications on Admission:  Current Outpatient Medications:  .  aspirin EC 81 MG tablet, Take 81 mg by mouth daily., Disp: , Rfl:  .  benzonatate (TESSALON) 100 MG capsule, Take 1 capsule (100 mg total) by mouth 2 (two) times daily as needed for cough., Disp: 30 capsule, Rfl: 1 .  buPROPion (WELLBUTRIN SR) 100 MG 12 hr tablet, Take 1 tablet (100 mg total) by mouth daily., Disp: 30 tablet, Rfl: 6 .  Cholecalciferol (VITAMIN D) 2000 units tablet, Take 2,000 Units by mouth daily., Disp: , Rfl:  .  clotrimazole (LOTRIMIN) 1 % cream, Apply 1 application topically 2 (two) times daily. For 2 weeks to rash, Disp: 60 g, Rfl: 0 .  fluticasone (FLONASE) 50 MCG/ACT nasal spray, Place 2 sprays into both nostrils daily., Disp: 48 g, Rfl: 3 .  gabapentin (NEURONTIN) 100 MG capsule, Take 1 capsule (100 mg total) by mouth 2 (two) times daily., Disp: 180 capsule, Rfl: 3 .  hydrOXYzine (ATARAX/VISTARIL) 25 MG tablet, Take 1 tablet (25 mg total) by mouth 2 (two) times daily as needed for anxiety., Disp: 180 tablet, Rfl: 0 .  ipratropium-albuterol (DUONEB) 0.5-2.5 (3) MG/3ML SOLN, Inhale 3 mLs into the lungs every 6 (six) hours as needed., Disp: 360 mL, Rfl: 3 .  LINZESS 290 MCG CAPS capsule, Take 1 capsule (290 mcg total) by mouth daily as needed (constipation)., Disp: 30 capsule, Rfl: 3 .  Multiple Vitamins-Minerals (MULTIVITAMIN ADULT) TABS, Take 1 tablet by mouth daily., Disp: , Rfl:  .  naproxen (NAPROSYN) 375 MG tablet, Take 1 tablet (375 mg total) by mouth 2 (two) times daily as needed  for moderate pain., Disp: 40 tablet, Rfl: 1 .  OXYGEN, Inhale 3 L/min into the lungs continuous., Disp: , Rfl:  .  sertraline (ZOLOFT) 100 MG tablet, Take 1.5 tablets (150 mg total) by mouth daily., Disp: 135 tablet, Rfl: 3 .  SYMBICORT 160-4.5 MCG/ACT inhaler, Inhale 2 puffs into the lungs 2 (two) times daily., Disp: 3 Inhaler, Rfl: 3 .  tiotropium (SPIRIVA) 18 MCG inhalation capsule, Place 1 capsule (18 mcg total) into inhaler and inhale daily., Disp: 90 capsule, Rfl: 4 .  traZODone (DESYREL) 50 MG tablet, Take 1 tablet (50 mg total) by mouth at bedtime., Disp: 90 tablet, Rfl: 3 .  VENTOLIN HFA 108 (90 Base) MCG/ACT inhaler, Inhale 2 puffs into the lungs every 6 (six) hours as needed for wheezing or shortness of breath., Disp: 54 g, Rfl: 1 .  vitamin B-12 (CYANOCOBALAMIN) 1000 MCG tablet, Take 1,000 mcg by mouth daily., Disp: , Rfl:  .  vitamin E 400 UNIT capsule, Take 400 Units by mouth daily. , Disp: , Rfl:   Past Medical History: Past Medical History:  Diagnosis Date  . Cervical cancer ([place])   . Chronic idiopathic constipation   . COPD (chronic obstructive pulmonary disease) ([place])   . Depression   . Endometriosis   . HLD (hyperlipidemia)     Tobacco Use: Social History   Tobacco Use  Smoking Status Current Every Day Smoker  .  Packs/day: 0.50  . Years: 47.00  . Pack years: 23.50  . Types: Cigarettes  . Last attempt to quit: 07/25/2018  . Years since quitting: 0.0  Smokeless Tobacco Never Used  Tobacco Comment   07/28/18 quit smoking on 07/25/18    Labs: Recent Review Flowsheet Data    Labs for ITP Cardiac and Pulmonary Rehab Latest Ref Rng & Units 04/04/2009 04/09/2016 02/05/2017 02/06/2018   Cholestrol 0 - 200 mg/dL - - 207(H) 181   LDLCALC 0 - 99 mg/dL - - 137(H) 105(H)   HDL >39.00 mg/dL - - 43.60 45.30   Trlycerides 0.0 - 149.0 mg/dL - - 133.0 153.0(H)   PHART 7.350 - 7.450 7.302(L) 7.30(L) - -   PCO2ART 32.0 - 48.0 mmHg 59.5 CRITICAL RESULT CALLED TO, READ BACK BY AND  VERIFIED WITH: [facility] 04/04/09 AT 1440 CRITICAL RESULT CALLED TO, READ BACK BY AND VERIFIED WITH: [facility] 04/04/09 AT 1440 BY BROADNAX,L.RRT(HH) 65(H) - -   HCO3 20.0 - 28.0 mmol/L 28.5(H) 32.0(H) - -   TCO2 0 - 100 mmol/L 25.9 - - -   O2SAT % 95.5 88.7 - -       Pulmonary Assessment Scores: Pulmonary Assessment Scores    Row Name 07/20/18 1652 07/22/18 1148       ADL [facility]   ADL Phase  Entry pt will bring paperwork back first day of exericse  Entry    SOB Score total  -  86    Rest  -  3    Walk  -  3    Stairs  -  5    Bath  -  4    Dress  -  2    Shop  -  5      CAT Score   CAT Score  -  26      mMRC Score   mMRC Score  4  -       Pulmonary Function Assessment: Pulmonary Function Assessment - 07/20/18 1646      Initial Spirometry Results   FVC%  48 %    FEV1%  31 %    FEV1/FVC Ratio  53    Comments  Test date 01/15/18      Post Bronchodilator Spirometry Results   FVC%  51 %    FEV1%  29 %    FEV1/FVC Ratio  46    Comments  Test date 01/15/18      Breath   Bilateral Breath Sounds  Wheezes    Shortness of Breath  Yes;Limiting activity;Fear of Shortness of Breath       Exercise Target Goals: Exercise Program Goal: Individual exercise prescription set using results from initial 6 min walk test and THRR while considering  patient's activity barriers and safety.   Exercise Prescription Goal: Initial exercise prescription builds to 30-45 minutes a day of aerobic activity, 2-3 days per week.  Home exercise guidelines will be given to patient during program as part of exercise prescription that the participant will acknowledge.  Activity Barriers & Risk Stratification: Activity Barriers & Cardiac Risk Stratification - 07/20/18 1639      Activity Barriers & Cardiac Risk Stratification   Activity Barriers  Balance Concerns;Deconditioning;Muscular Weakness;Shortness of Breath;Back Problems   Hx of low back fracture, osteoprosis in hips and back      6  Minute Walk: 6 Minute Walk    Row Name 07/20/18 1637         6 Minute Walk   Phase  Initial     Distance  800 feet     Walk Time  4.5 minutes     # of Rest Breaks  1 1:30     MPH  2.02     METS  3.2     RPE  19     Perceived Dyspnea   4     VO2 Peak  11.19     Symptoms  Yes (comment)     Comments  SOB     Resting HR  90 bpm     Resting BP  100/60     Resting Oxygen Saturation   93 %     Exercise Oxygen Saturation  during 6 min walk  83 %     Max Ex. HR  133 bpm     Max Ex. BP  118/70     2 Minute Post BP  102/68       Interval HR   1 Minute HR  109     2 Minute HR  120     3 Minute HR  128     4 Minute HR  133     5 Minute HR  117     6 Minute HR  126     2 Minute Post HR  100     Interval Heart Rate?  Yes       Interval Oxygen   Interval Oxygen?  Yes     Baseline Oxygen Saturation %  93 %     1 Minute Oxygen Saturation %  90 %     1 Minute Liters of Oxygen  3 L pulsed     2 Minute Oxygen Saturation %  90 %     2 Minute Liters of Oxygen  3 L     3 Minute Oxygen Saturation %  85 %     3 Minute Liters of Oxygen  3 L     4 Minute Oxygen Saturation %  83 % resting     4 Minute Liters of Oxygen  3 L     5 Minute Oxygen Saturation %  92 %     5 Minute Liters of Oxygen  3 L     6 Minute Oxygen Saturation %  91 %     6 Minute Liters of Oxygen  3 L     2 Minute Post Oxygen Saturation %  92 %     2 Minute Post Liters of Oxygen  3 L       Oxygen Initial Assessment: Oxygen Initial Assessment - 07/20/18 1541      Home Oxygen   Home Oxygen Device  Home Concentrator;Portable Concentrator;E-Tanks    Sleep Oxygen Prescription  Continuous    Liters per minute  3    Home Exercise Oxygen Prescription  Pulsed    Liters per minute  3    Home at Rest Exercise Oxygen Prescription  Continuous    Liters per minute  3    Compliance with Home Oxygen Use  Yes      Initial 6 min Walk   Oxygen Used  Pulsed    Liters per minute  3      Program Oxygen Prescription   Program  Oxygen Prescription  Continuous;E-Tanks    Liters per minute  3      Intervention   Short Term Goals  To learn and exhibit compliance with exercise, home and travel O2 prescription;To learn and understand importance of monitoring  SPO2 with pulse oximeter and demonstrate accurate use of the pulse oximeter.;To learn and understand importance of maintaining oxygen saturations>88%;To learn and demonstrate proper pursed lip breathing techniques or other breathing techniques.;To learn and demonstrate proper use of respiratory medications    Long  Term Goals  Exhibits compliance with exercise, home and travel O2 prescription;Verbalizes importance of monitoring SPO2 with pulse oximeter and return demonstration;Maintenance of O2 saturations>88%;Exhibits proper breathing techniques, such as pursed lip breathing or other method taught during program session;Compliance with respiratory medication;Demonstrates proper use of MDI's       Oxygen Re-Evaluation: Oxygen Re-Evaluation    Row Name 07/22/18 1135 08/05/18 1409           Program Oxygen Prescription   Program Oxygen Prescription  -  Continuous;E-Tanks      Liters per minute  -  3      Comments  -  patient sometimes uses 4 liters of oxygen.        Home Oxygen   Home Oxygen Device  -  Home Concentrator;Portable Concentrator;E-Tanks      Sleep Oxygen Prescription  -  Continuous      Liters per minute  -  3      Home Exercise Oxygen Prescription  -  Pulsed      Liters per minute  -  3      Home at Rest Exercise Oxygen Prescription  -  Continuous      Liters per minute  -  3      Compliance with Home Oxygen Use  -  Yes        Goals/Expected Outcomes   Short Term Goals  -  To learn and exhibit compliance with exercise, home and travel O2 prescription;To learn and understand importance of monitoring SPO2 with pulse oximeter and demonstrate accurate use of the pulse oximeter.;To learn and understand importance of maintaining oxygen  saturations>88%;To learn and demonstrate proper pursed lip breathing techniques or other breathing techniques.;To learn and demonstrate proper use of respiratory medications      Long  Term Goals  -  Exhibits compliance with exercise, home and travel O2 prescription;Verbalizes importance of monitoring SPO2 with pulse oximeter and return demonstration;Maintenance of O2 saturations>88%;Exhibits proper breathing techniques, such as pursed lip breathing or other method taught during program session;Compliance with respiratory medication;Demonstrates proper use of MDI's      Comments  Reviewed PLB technique with pt.  Talked about how it work and it's important to maintaining his exercise saturations.    Spoke to patient about COPD Action Plan. Reviewed and talked with the patient about the different levels of COPD severity that they should review on a daily basis and the steps they can take to manage their COPD. Went over pertinent actions for the patient can take when they feel like they are having a COPD exacerbation and the necessary treatment if needed. If patient has any changes with their breathing or feel like they are in a different zone of severity to inform staff for re-evaluation. Patient verbalizes understanding. Copy given to patient. Patient states that her roomate needs to quit smoking so she can quit smoking, She has not had a cigarette in a week. She also states that she has not had an exacerbation in over a year.      Goals/Expected Outcomes  Short: Become more profiecient at using PLB.   Long: Become independent at using PLB.  Short: Follow COPD action plan. Long: Report any changes and severity of COPD.  Oxygen Discharge (Final Oxygen Re-Evaluation): Oxygen Re-Evaluation - 08/05/18 1409      Program Oxygen Prescription   Program Oxygen Prescription  Continuous;E-Tanks    Liters per minute  3    Comments  patient sometimes uses 4 liters of oxygen.      Home Oxygen   Home Oxygen  Device  Home Concentrator;Portable Concentrator;E-Tanks    Sleep Oxygen Prescription  Continuous    Liters per minute  3    Home Exercise Oxygen Prescription  Pulsed    Liters per minute  3    Home at Rest Exercise Oxygen Prescription  Continuous    Liters per minute  3    Compliance with Home Oxygen Use  Yes      Goals/Expected Outcomes   Short Term Goals  To learn and exhibit compliance with exercise, home and travel O2 prescription;To learn and understand importance of monitoring SPO2 with pulse oximeter and demonstrate accurate use of the pulse oximeter.;To learn and understand importance of maintaining oxygen saturations>88%;To learn and demonstrate proper pursed lip breathing techniques or other breathing techniques.;To learn and demonstrate proper use of respiratory medications    Long  Term Goals  Exhibits compliance with exercise, home and travel O2 prescription;Verbalizes importance of monitoring SPO2 with pulse oximeter and return demonstration;Maintenance of O2 saturations>88%;Exhibits proper breathing techniques, such as pursed lip breathing or other method taught during program session;Compliance with respiratory medication;Demonstrates proper use of MDI's    Comments  Spoke to patient about COPD Action Plan. Reviewed and talked with the patient about the different levels of COPD severity that they should review on a daily basis and the steps they can take to manage their COPD. Went over pertinent actions for the patient can take when they feel like they are having a COPD exacerbation and the necessary treatment if needed. If patient has any changes with their breathing or feel like they are in a different zone of severity to inform staff for re-evaluation. Patient verbalizes understanding. Copy given to patient. Patient states that her roomate needs to quit smoking so she can quit smoking, She has not had a cigarette in a week. She also states that she has not had an exacerbation in over  a year.    Goals/Expected Outcomes  Short: Follow COPD action plan. Long: Report any changes and severity of COPD.       Initial Exercise Prescription: Initial Exercise Prescription - 07/20/18 1600      Date of Initial Exercise RX and Referring Provider   Date  07/20/18    Referring Provider  Louretta Parma MD      Oxygen   Oxygen  Continuous    Liters  3      Treadmill   MPH  1.7    Grade  0.5    Minutes  15    METs  2.42      NuStep   Level  1    SPM  80    Minutes  15    METs  2      REL-XR   Level  1    Speed  50    Minutes  15    METs  2      Prescription Details   Frequency (times per week)  3    Duration  Progress to 45 minutes of aerobic exercise without signs/symptoms of physical distress      Intensity   THRR 40-80% of Max Heartrate  118-147    Ratings  of Perceived Exertion  11-13    Perceived Dyspnea  0-4      Progression   Progression  Continue to progress workloads to maintain intensity without signs/symptoms of physical distress.      Resistance Training   Training Prescription  Yes    Weight  3 lbs    Reps  10-15       Perform Capillary Blood Glucose checks as needed.  Exercise Prescription Changes: Exercise Prescription Changes    Row Name 07/20/18 1600 07/27/18 1500 07/29/18 1000 08/12/18 1200       Response to Exercise   Blood Pressure (Admit)  100/60  126/60  -  134/72    Blood Pressure (Exercise)  118/70  136/74  -  122/64    Blood Pressure (Exit)  102/68  128/64  -  121/64    Heart Rate (Admit)  90 bpm  88 bpm  -  92 bpm    Heart Rate (Exercise)  133 bpm  85 bpm  -  117 bpm    Heart Rate (Exit)  100 bpm  100 bpm  -  86 bpm    Oxygen Saturation (Admit)  93 %  97 %  -  97 %    Oxygen Saturation (Exercise)  83 %  88 %  -  94 %    Oxygen Saturation (Exit)  92 %  92 %  -  98 %    Rating of Perceived Exertion (Exercise)  19  14  -  12    Perceived Dyspnea (Exercise)  4  3  -  1    Symptoms  SOB  SOB  -  none    Comments  walk  test results  first full day of exercise  -  -    Duration  -  Progress to 45 minutes of aerobic exercise without signs/symptoms of physical distress  -  Progress to 45 minutes of aerobic exercise without signs/symptoms of physical distress    Intensity  -  THRR unchanged  -  THRR unchanged      Progression   Progression  -  Continue to progress workloads to maintain intensity without signs/symptoms of physical distress.  -  Continue to progress workloads to maintain intensity without signs/symptoms of physical distress.    Average METs  -  2.55  -  2.81      Resistance Training   Training Prescription  -  Yes  -  Yes    Weight  -  3 lbs  -  3 lb    Reps  -  10-15  -  10-15      Interval Training   Interval Training  -  No  -  No      Oxygen   Oxygen  -  Continuous  -  -    Liters  -  3  -  -      Treadmill   MPH  -  -  -  1.7    Grade  -  -  -  0.5    Minutes  -  -  -  15    METs  -  -  -  2.42      NuStep   Level  -  1  -  -    Minutes  -  15  -  -    METs  -  2.3  -  -      REL-XR  Level  -  1  -  1    Speed  -  -  -  50    Minutes  -  15  -  15    METs  -  2.8  -  3.2      Home Exercise Plan   Plans to continue exercise at  -  -  Home (comment) walking  Home (comment) walking    Frequency  -  -  Add 1 additional day to program exercise sessions.  Add 1 additional day to program exercise sessions.    Initial Home Exercises Provided  -  -  07/29/18  07/29/18       Exercise Comments: Exercise Comments    Row Name 07/22/18 1134           Exercise Comments  First full day of exercise!  Patient was oriented to gym and equipment including functions, settings, policies, and procedures.  Patient's individual exercise prescription and treatment plan were reviewed.  All starting workloads were established based on the results of the 6 minute walk test done at initial orientation visit.  The plan for exercise progression was also introduced and progression will be  customized based on patient's performance and goals.          Exercise Goals and Review: Exercise Goals    Row Name 07/20/18 1643             Exercise Goals   Increase Physical Activity  Yes       Intervention  Provide advice, education, support and counseling about physical activity/exercise needs.;Develop an individualized exercise prescription for aerobic and resistive training based on initial evaluation findings, risk stratification, comorbidities and participant's personal goals.       Expected Outcomes  Short Term: Attend rehab on a regular basis to increase amount of physical activity.;Long Term: Add in home exercise to make exercise part of routine and to increase amount of physical activity.;Long Term: Exercising regularly at least 3-5 days a week.       Increase Strength and Stamina  Yes       Intervention  Provide advice, education, support and counseling about physical activity/exercise needs.;Develop an individualized exercise prescription for aerobic and resistive training based on initial evaluation findings, risk stratification, comorbidities and participant's personal goals.       Expected Outcomes  Short Term: Increase workloads from initial exercise prescription for resistance, speed, and METs.;Long Term: Improve cardiorespiratory fitness, muscular endurance and strength as measured by increased METs and functional capacity (6MWT);Short Term: Perform resistance training exercises routinely during rehab and add in resistance training at home       Able to understand and use rate of perceived exertion (RPE) scale  Yes       Intervention  Provide education and explanation on how to use RPE scale       Expected Outcomes  Short Term: Able to use RPE daily in rehab to express subjective intensity level;Long Term:  Able to use RPE to guide intensity level when exercising independently       Able to understand and use Dyspnea scale  Yes       Intervention  Provide education and  explanation on how to use Dyspnea scale       Expected Outcomes  Short Term: Able to use Dyspnea scale daily in rehab to express subjective sense of shortness of breath during exertion;Long Term: Able to use Dyspnea scale to guide intensity level when exercising independently  Knowledge and understanding of Target Heart Rate Range (THRR)  Yes       Intervention  Provide education and explanation of THRR including how the numbers were predicted and where they are located for reference       Expected Outcomes  Short Term: Able to state/look up THRR;Short Term: Able to use daily as guideline for intensity in rehab;Long Term: Able to use THRR to govern intensity when exercising independently       Able to check pulse independently  Yes       Intervention  Provide education and demonstration on how to check pulse in carotid and radial arteries.;Review the importance of being able to check your own pulse for safety during independent exercise       Expected Outcomes  Short Term: Able to explain why pulse checking is important during independent exercise;Long Term: Able to check pulse independently and accurately       Understanding of Exercise Prescription  Yes       Intervention  Provide education, explanation, and written materials on patient's individual exercise prescription       Expected Outcomes  Short Term: Able to explain program exercise prescription;Long Term: Able to explain home exercise prescription to exercise independently          Exercise Goals Re-Evaluation : Exercise Goals Re-Evaluation    Row Name 07/22/18 1134 07/27/18 1552 07/29/18 1045 08/12/18 1212       Exercise Goal Re-Evaluation   Exercise Goals Review  Increase Physical Activity;Increase Strength and Stamina;Able to understand and use Dyspnea scale;Able to understand and use rate of perceived exertion (RPE) scale;Knowledge and understanding of Target Heart Rate Range (THRR);Understanding of Exercise Prescription   Increase Physical Activity;Increase Strength and Stamina;Understanding of Exercise Prescription  Increase Physical Activity;Able to understand and use rate of perceived exertion (RPE) scale;Knowledge and understanding of Target Heart Rate Range (THRR);Understanding of Exercise Prescription;Able to understand and use Dyspnea scale;Increase Strength and Stamina;Able to check pulse independently  Increase Physical Activity;Increase Strength and Stamina;Able to understand and use rate of perceived exertion (RPE) scale;Able to understand and use Dyspnea scale;Knowledge and understanding of Target Heart Rate Range (THRR);Able to check pulse independently;Understanding of Exercise Prescription    Comments  Reviewed RPE scale, THR and program prescription with pt today.  Pt voiced understanding and was given a copy of goals to take home.   Shaquavia has only completed her first full day of exercise.  She has not been here since then for various reasons.  She has also requested to change classes to the 10am.  We will continue to encourage better attendance and monitor her progress.   Reviewed home exercise with pt today.  Pt plans to walk at home for exercise.  Reviewed THR, pulse, RPE, sign and symptoms, and when to call 911 or MD.  Also discussed weather considerations and indoor options.  Pt voiced understanding.  Kandiss has only attended two exercise sessions in February.  More consistent attendance will give better progression.    Expected Outcomes  Short: Use RPE daily to regulate intensity. Long: Follow program prescription in THR.  Short: Attend class regularly.  Long: Follow program prescription.   Short: Start to walk at home.  Long: Continue to increase physical activity  Short - attend 2-3 times per week Long - improve MET level       Discharge Exercise Prescription (Final Exercise Prescription Changes): Exercise Prescription Changes - 08/12/18 1200      Response to Exercise  Blood Pressure (Admit)  134/72     Blood Pressure (Exercise)  122/64    Blood Pressure (Exit)  121/64    Heart Rate (Admit)  92 bpm    Heart Rate (Exercise)  117 bpm    Heart Rate (Exit)  86 bpm    Oxygen Saturation (Admit)  97 %    Oxygen Saturation (Exercise)  94 %    Oxygen Saturation (Exit)  98 %    Rating of Perceived Exertion (Exercise)  12    Perceived Dyspnea (Exercise)  1    Symptoms  none    Duration  Progress to 45 minutes of aerobic exercise without signs/symptoms of physical distress    Intensity  THRR unchanged      Progression   Progression  Continue to progress workloads to maintain intensity without signs/symptoms of physical distress.    Average METs  2.81      Resistance Training   Training Prescription  Yes    Weight  3 lb    Reps  10-15      Interval Training   Interval Training  No      Treadmill   MPH  1.7    Grade  0.5    Minutes  15    METs  2.42      REL-XR   Level  1    Speed  50    Minutes  15    METs  3.2      Home Exercise Plan   Plans to continue exercise at  Home (comment)   walking   Frequency  Add 1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

## 2018-09-02 ENCOUNTER — Encounter: Payer: Self-pay | Admitting: Family Medicine

## 2018-09-02 ENCOUNTER — Ambulatory Visit (INDEPENDENT_AMBULATORY_CARE_PROVIDER_SITE_OTHER)
Admission: RE | Admit: 2018-09-02 | Discharge: 2018-09-02 | Disposition: A | Discharge status: Home / Self Care | Payer: Medicare Other | Source: Ambulatory Visit | Attending: Family Medicine | Admitting: Family Medicine

## 2018-09-02 ENCOUNTER — Telehealth: Payer: Self-pay

## 2018-09-02 ENCOUNTER — Ambulatory Visit (INDEPENDENT_AMBULATORY_CARE_PROVIDER_SITE_OTHER): Payer: Medicare Other | Admitting: Family Medicine

## 2018-09-02 ENCOUNTER — Other Ambulatory Visit: Payer: Self-pay

## 2018-09-02 VITALS — BP 122/68 | HR 107 | Temp 98.4°F | Ht 64.5 in | Wt 120.2 lb

## 2018-09-02 DIAGNOSIS — R059 Cough, unspecified: Secondary | ICD-10-CM

## 2018-09-02 DIAGNOSIS — J441 Chronic obstructive pulmonary disease with (acute) exacerbation: Secondary | ICD-10-CM | POA: Insufficient documentation

## 2018-09-02 DIAGNOSIS — J439 Emphysema, unspecified: Secondary | ICD-10-CM | POA: Diagnosis not present

## 2018-09-02 DIAGNOSIS — R05 Cough: Secondary | ICD-10-CM | POA: Diagnosis not present

## 2018-09-02 DIAGNOSIS — R0602 Shortness of breath: Secondary | ICD-10-CM | POA: Diagnosis not present

## 2018-09-02 MED ORDER — PREDNISONE 20 MG PO TABS: ORAL_TABLET | ORAL | 0 refills | Status: DC

## 2018-09-02 MED ORDER — AMOXICILLIN-POT CLAVULANATE 875-125 MG PO TABS: 1.0000 | ORAL_TABLET | Freq: Two times a day (BID) | ORAL | 0 refills | Status: AC

## 2018-09-02 NOTE — Telephone Encounter (Signed)
Pt has been sick for 3 wks. Pt is not sure if has fever,but pt feels cold.pt having SOB but pt said she has COPD and pt increased continuous O2 from 3L to 4 L.pt has non prod cough and fatigue. Pt has not traveled and no known exposure to covid or flu. Pt has appt to see Dr Darnell Level today at 5 pm.

## 2018-09-02 NOTE — Progress Notes (Addendum)
BP 122/68 (BP Location: Right Arm, Patient Position: Sitting, Cuff Size: Normal)   Pulse (!) 107   Temp 98.4 F (36.9 C) (Oral)   Ht 5' 4.5" (1.638 m)   Wt 120 lb 3 oz (54.5 kg)   SpO2 97% Comment: 3 L  BMI 20.31 kg/m   Pulse Readings from Last 3 Encounters:  09/02/18 (!) 107  08/13/18 93  06/01/18 (!) 104    CC: cough Subjective:    Patient ID: Christine Serrano, female    DOB: 1961-06-17, 58 y.o.   MRN: [id number]  HPI: Christine Serrano is a 58 y.o. female presenting on 09/02/2018 for Cough (C/o cough, nasal congestion, "raw" feeling in throat/chest and shortness of breath. Sxs started about 3 wks ago. )   3 wk h/o increased nonproductive cough, rawness of chest and throat, nasal congestion and PNdrainage. Has needed to increase home oxygen to 4LNC. Increased wheezing. Some chills. Increased fatigue.   Has been using duoneb TID as well as her regular respiratory medications of symbicort, flonase, spiriva.   No sick contacts at home. No recent travel. Has not been around many people.  No fevers, ear or tooth pain, HA.   Known severe COPD normally on 3LNC.      Relevant past medical, surgical, family and social history reviewed and updated as indicated. Interim medical history since our last visit reviewed. Allergies and medications reviewed and updated. Outpatient Medications Prior to Visit  Medication Sig Dispense Refill  . aspirin EC 81 MG tablet Take 81 mg by mouth daily.    . benzonatate (TESSALON) 100 MG capsule Take 1 capsule (100 mg total) by mouth 2 (two) times daily as needed for cough. 30 capsule 1  . buPROPion (WELLBUTRIN SR) 100 MG 12 hr tablet Take 1 tablet (100 mg total) by mouth daily. 30 tablet 6  . Cholecalciferol (VITAMIN D) 2000 units tablet Take 2,000 Units by mouth daily.    . clotrimazole (LOTRIMIN) 1 % cream Apply 1 application topically 2 (two) times daily. For 2 weeks to rash 60 g 0  . fluticasone (FLONASE) 50 MCG/ACT nasal spray Place 2 sprays  into both nostrils daily. 48 g 3  . gabapentin (NEURONTIN) 100 MG capsule Take 1 capsule (100 mg total) by mouth 2 (two) times daily. 180 capsule 3  . hydrOXYzine (ATARAX/VISTARIL) 25 MG tablet Take 1 tablet (25 mg total) by mouth 2 (two) times daily as needed for anxiety. 180 tablet 0  . ipratropium-albuterol (DUONEB) 0.5-2.5 (3) MG/3ML SOLN Inhale 3 mLs into the lungs every 6 (six) hours as needed. 360 mL 3  . LINZESS 290 MCG CAPS capsule Take 1 capsule (290 mcg total) by mouth daily as needed (constipation). 30 capsule 3  . Multiple Vitamins-Minerals (MULTIVITAMIN ADULT) TABS Take 1 tablet by mouth daily.    . naproxen (NAPROSYN) 375 MG tablet Take 1 tablet (375 mg total) by mouth 2 (two) times daily as needed for moderate pain. 40 tablet 1  . OXYGEN Inhale 3 L/min into the lungs continuous.    . sertraline (ZOLOFT) 100 MG tablet Take 1.5 tablets (150 mg total) by mouth daily. 135 tablet 3  . SYMBICORT 160-4.5 MCG/ACT inhaler Inhale 2 puffs into the lungs 2 (two) times daily. 3 Inhaler 3  . tiotropium (SPIRIVA) 18 MCG inhalation capsule Place 1 capsule (18 mcg total) into inhaler and inhale daily. 90 capsule 4  . traZODone (DESYREL) 50 MG tablet Take 1 tablet (50 mg total) by mouth at bedtime.  90 tablet 3  . VENTOLIN HFA 108 (90 Base) MCG/ACT inhaler Inhale 2 puffs into the lungs every 6 (six) hours as needed for wheezing or shortness of breath. 54 g 1  . vitamin B-12 (CYANOCOBALAMIN) 1000 MCG tablet Take 1,000 mcg by mouth daily.    . vitamin E 400 UNIT capsule Take 400 Units by mouth daily.      No facility-administered medications prior to visit.      Per HPI unless specifically indicated in ROS section below Review of Systems Objective:    BP 122/68 (BP Location: Right Arm, Patient Position: Sitting, Cuff Size: Normal)   Pulse (!) 107   Temp 98.4 F (36.9 C) (Oral)   Ht 5' 4.5" (1.638 m)   Wt 120 lb 3 oz (54.5 kg)   SpO2 97% Comment: 3 L  BMI 20.31 kg/m   Wt Readings from Last  3 Encounters:  09/02/18 120 lb 3 oz (54.5 kg)  08/13/18 121 lb (54.9 kg)  07/20/18 115 lb 14.4 oz (52.6 kg)    Physical Exam Vitals signs and nursing note reviewed.  Constitutional:      General: She is not in acute distress.    Appearance: She is underweight. She is ill-appearing (chronic).     Comments: Nontoxic   HENT:     Head: Normocephalic and atraumatic.     Right Ear: Hearing, tympanic membrane, ear canal and external ear normal.     Left Ear: Hearing, tympanic membrane, ear canal and external ear normal.     Nose: Congestion present. No mucosal edema or rhinorrhea.     Right Sinus: No maxillary sinus tenderness or frontal sinus tenderness.     Left Sinus: No maxillary sinus tenderness or frontal sinus tenderness.     Mouth/Throat:     Mouth: Mucous membranes are moist.     Pharynx: Uvula midline. No oropharyngeal exudate or posterior oropharyngeal erythema.     Tonsils: No tonsillar abscesses.  Eyes:     General: No scleral icterus.    Conjunctiva/sclera: Conjunctivae normal.     Pupils: Pupils are equal, round, and reactive to light.  Neck:     Musculoskeletal: Normal range of motion and neck supple.  Cardiovascular:     Rate and Rhythm: Normal rate and regular rhythm.     Pulses: Normal pulses.     Heart sounds: Normal heart sounds. No murmur.  Pulmonary:     Effort: Pulmonary effort is normal. No respiratory distress.     Breath sounds: Decreased breath sounds, wheezing (scattered) and rhonchi present. No rales.     Comments: Tight breath sounds, coarse throughout, deep cough present Lymphadenopathy:     Cervical: No cervical adenopathy.  Skin:    General: Skin is warm and dry.     Findings: No rash.  Neurological:     Mental Status: She is alert.       Results for orders placed or performed in visit on 06/05/18  VITAMIN D 25 Hydroxy (Vit-D Deficiency, Fractures)  Result Value Ref Range   Vit D, 25-Hydroxy 71 30 - 100 ng/mL  Calcium  Result Value Ref  Range   Calcium 9.7 8.6 - 10.4 mg/dL   Assessment & Plan:   Problem List Items Addressed This Visit    COPD exacerbation ([place]) - Primary    Increased cough and wheezing but without increase in sputum production. Anticipate COPD exacerbation. Low risk of COVID infection at this time. CXR today. Will treat as COPD flare with  prednisone course and augmentin course. Advised to call us early Friday with update.       Relevant Medications   predniSONE (DELTASONE) 20 MG tablet   COPD (chronic obstructive pulmonary disease) (H[facility])    Severe oxygen dependent COPD. Reviewed clearly how high risk she is for complications from [id number] infection, stressed importance of social isolation at this time, reviewed home hygiene measures.       Relevant Medications   predniSONE (DELTASONE) 20 MG tablet   Other Relevant Orders   DG Chest 2 View       Meds ordered this encounter  Medications  . amoxicillin-clavulanate (AUGMENTIN) 875-125 MG tablet    Sig: Take 1 tablet by mouth 2 (two) times daily for 10 days.    Dispense:  20 tablet    Refill:  0  . predniSONE (DELTASONE) 20 MG tablet    Sig: Take two tablets daily for 3 days followed by one tablet daily for 4 days    Dispense:  10 tablet    Refill:  0   Orders Placed This Encounter  Procedures  . DG Chest 2 View    Standing Status:   Future    Number of Occurrences:   1    Standing Expiration Date:   11/02/2019    Order Specific Question:   Reason for Exam (SYMPTOM  OR DIAGNOSIS REQUIRED)    Answer:   dyspnea, cough no fever, severe COPD    Order Specific Question:   Is patient pregnant?    Answer:   No    Order Specific Question:   Preferred imaging location?    Answer:   [facility]    Order Specific Question:   Radiology Contrast Protocol - do NOT remove file path    Answer:   [email or web address]    Patient Instructions  I do think you have COPD exacerbation.  Treat with augmentin and  prednisone course.  Push fluids and rest. Continue scheduled duoneb 3-4 times a day. Continue symbicort and spiriva and flonase.  Please call us Friday morning with an update    Follow up plan: Return if symptoms worsen or fail to improve.  Ria Bush, MD

## 2018-09-02 NOTE — Telephone Encounter (Signed)
Seen today. 

## 2018-09-02 NOTE — Assessment & Plan Note (Signed)
Increased cough and wheezing but without increase in sputum production. Anticipate COPD exacerbation. Low risk of COVID infection at this time. CXR today. Will treat as COPD flare with prednisone course and augmentin course. Advised to call us early Friday with update.

## 2018-09-02 NOTE — Patient Instructions (Signed)
I do think you have COPD exacerbation.  Treat with augmentin and prednisone course.  Push fluids and rest. Continue scheduled duoneb 3-4 times a day. Continue symbicort and spiriva and flonase.  Please call us Friday morning with an update

## 2018-09-02 NOTE — Assessment & Plan Note (Signed)
Severe oxygen dependent COPD. Reviewed clearly how high risk she is for complications from [id number] infection, stressed importance of social isolation at this time, reviewed home hygiene measures.

## 2018-09-03 DIAGNOSIS — J449 Chronic obstructive pulmonary disease, unspecified: Secondary | ICD-10-CM

## 2018-09-15 ENCOUNTER — Other Ambulatory Visit: Payer: Self-pay | Admitting: Family Medicine

## 2018-09-20 DIAGNOSIS — J449 Chronic obstructive pulmonary disease, unspecified: Secondary | ICD-10-CM | POA: Diagnosis not present

## 2018-09-21 ENCOUNTER — Other Ambulatory Visit: Payer: Self-pay

## 2018-09-21 ENCOUNTER — Ambulatory Visit (INDEPENDENT_AMBULATORY_CARE_PROVIDER_SITE_OTHER): Payer: Medicare Other | Admitting: Family Medicine

## 2018-09-21 ENCOUNTER — Encounter: Payer: Self-pay | Admitting: Family Medicine

## 2018-09-21 ENCOUNTER — Encounter: Payer: Self-pay | Admitting: *Deleted

## 2018-09-21 DIAGNOSIS — Z720 Tobacco use: Secondary | ICD-10-CM

## 2018-09-21 DIAGNOSIS — R040 Epistaxis: Secondary | ICD-10-CM | POA: Insufficient documentation

## 2018-09-21 DIAGNOSIS — R1013 Epigastric pain: Secondary | ICD-10-CM | POA: Diagnosis not present

## 2018-09-21 DIAGNOSIS — J449 Chronic obstructive pulmonary disease, unspecified: Secondary | ICD-10-CM

## 2018-09-21 DIAGNOSIS — K5909 Other constipation: Secondary | ICD-10-CM | POA: Diagnosis not present

## 2018-09-21 DIAGNOSIS — R0981 Nasal congestion: Secondary | ICD-10-CM | POA: Diagnosis not present

## 2018-09-21 MED ORDER — OMEPRAZOLE 40 MG PO CPDR: 40.0000 mg | DELAYED_RELEASE_CAPSULE | Freq: Every day | ORAL | 3 refills | Status: DC

## 2018-09-21 MED ORDER — POLYETHYLENE GLYCOL 3350 17 GM/SCOOP PO POWD: 17.0000 g | Freq: Every day | ORAL | 1 refills | Status: DC | PRN

## 2018-09-21 NOTE — Assessment & Plan Note (Signed)
Continue to encourage smoking cessation. 

## 2018-09-21 NOTE — Progress Notes (Signed)
Pulmonary Individual Treatment Plan  Patient Details  Name: Christine Serrano MRN: [id number] Date of Birth: [birth year] Referring Provider:     Pulmonary Rehab from 07/20/2018 in [facility] ([place] Cardiac and Pulmonary Rehab  Referring Provider  Louretta Parma MD      Initial Encounter Date:    Pulmonary Rehab from 07/20/2018 in [facility] Cardiac and Pulmonary Rehab  Date  07/20/18      Visit Diagnosis: COPD, severe ([place])  Patient's Home Medications on Admission:  Current Outpatient Medications:  .  aspirin EC 81 MG tablet, Take 81 mg by mouth daily., Disp: , Rfl:  .  benzonatate (TESSALON) 100 MG capsule, Take 1 capsule (100 mg total) by mouth 2 (two) times daily as needed for cough., Disp: 30 capsule, Rfl: 1 .  buPROPion (WELLBUTRIN SR) 100 MG 12 hr tablet, TAKE 1 TABLET BY MOUTH EVERY DAY, Disp: 90 tablet, Rfl: 0 .  Cholecalciferol (VITAMIN D) 2000 units tablet, Take 2,000 Units by mouth daily., Disp: , Rfl:  .  clotrimazole (LOTRIMIN) 1 % cream, Apply 1 application topically 2 (two) times daily. For 2 weeks to rash, Disp: 60 g, Rfl: 0 .  fluticasone (FLONASE) 50 MCG/ACT nasal spray, Place 2 sprays into both nostrils daily., Disp: 48 g, Rfl: 3 .  gabapentin (NEURONTIN) 100 MG capsule, Take 1 capsule (100 mg total) by mouth 2 (two) times daily., Disp: 180 capsule, Rfl: 3 .  hydrOXYzine (ATARAX/VISTARIL) 25 MG tablet, Take 1 tablet (25 mg total) by mouth 2 (two) times daily as needed for anxiety., Disp: 180 tablet, Rfl: 0 .  ipratropium-albuterol (DUONEB) 0.5-2.5 (3) MG/3ML SOLN, Inhale 3 mLs into the lungs every 6 (six) hours as needed., Disp: 360 mL, Rfl: 3 .  LINZESS 290 MCG CAPS capsule, Take 1 capsule (290 mcg total) by mouth daily as needed (constipation)., Disp: 30 capsule, Rfl: 3 .  Multiple Vitamins-Minerals (MULTIVITAMIN ADULT) TABS, Take 1 tablet by mouth daily., Disp: , Rfl:  .  naproxen (NAPROSYN) 375 MG tablet, Take 1 tablet (375 mg total) by mouth 2 (two) times daily as needed for moderate  pain., Disp: 40 tablet, Rfl: 1 .  omeprazole (PRILOSEC) 40 MG capsule, Take 1 capsule (40 mg total) by mouth daily. For three weeks then as needed. Take 30 min prior to large meal, Disp: 30 capsule, Rfl: 3 .  OXYGEN, Inhale 3 L/min into the lungs continuous., Disp: , Rfl:  .  polyethylene glycol powder (GLYCOLAX/MIRALAX) powder, Take 17 g by mouth daily as needed for moderate constipation., Disp: 3350 g, Rfl: 1 .  sertraline (ZOLOFT) 100 MG tablet, Take 1.5 tablets (150 mg total) by mouth daily., Disp: 135 tablet, Rfl: 3 .  SYMBICORT 160-4.5 MCG/ACT inhaler, Inhale 2 puffs into the lungs 2 (two) times daily., Disp: 3 Inhaler, Rfl: 3 .  tiotropium (SPIRIVA) 18 MCG inhalation capsule, Place 1 capsule (18 mcg total) into inhaler and inhale daily., Disp: 90 capsule, Rfl: 4 .  traZODone (DESYREL) 50 MG tablet, Take 1 tablet (50 mg total) by mouth at bedtime., Disp: 90 tablet, Rfl: 3 .  VENTOLIN HFA 108 (90 Base) MCG/ACT inhaler, Inhale 2 puffs into the lungs every 6 (six) hours as needed for wheezing or shortness of breath., Disp: 54 g, Rfl: 1 .  vitamin B-12 (CYANOCOBALAMIN) 1000 MCG tablet, Take 1,000 mcg by mouth daily., Disp: , Rfl:  .  vitamin E 400 UNIT capsule, Take 400 Units by mouth daily. , Disp: , Rfl:   Past Medical History: Past Medical History:  Diagnosis Date  . Cervical cancer ([place])   . Chronic idiopathic constipation   . COPD (chronic obstructive pulmonary disease) ([place])   . Depression   . Endometriosis   . HLD (hyperlipidemia)     Tobacco Use: Social History   Tobacco Use  Smoking Status Current Every Day Smoker  . Packs/day: 0.50  . Years: 47.00  . Pack years: 23.50  . Types: Cigarettes  . Last attempt to quit: 07/25/2018  . Years since quitting: 0.1  Smokeless Tobacco Never Used  Tobacco Comment   07/28/18 quit smoking on 07/25/18    Labs: Recent Review Flowsheet Data    Labs for ITP Cardiac and Pulmonary Rehab Latest Ref Rng & Units 04/04/2009 04/09/2016 02/05/2017  02/06/2018   Cholestrol 0 - 200 mg/dL - - 207(H) 181   LDLCALC 0 - 99 mg/dL - - 137(H) 105(H)   HDL >39.00 mg/dL - - 43.60 45.30   Trlycerides 0.0 - 149.0 mg/dL - - 133.0 153.0(H)   PHART 7.350 - 7.450 7.302(L) 7.30(L) - -   PCO2ART 32.0 - 48.0 mmHg 59.5 CRITICAL RESULT CALLED TO, READ BACK BY AND VERIFIED WITH: [facility] 04/04/09 AT 1440 CRITICAL RESULT CALLED TO, READ BACK BY AND VERIFIED WITH: [facility] 04/04/09 AT 1440 BY BROADNAX,L.RRT(HH) 65(H) - -   HCO3 20.0 - 28.0 mmol/L 28.5(H) 32.0(H) - -   TCO2 0 - 100 mmol/L 25.9 - - -   O2SAT % 95.5 88.7 - -       Pulmonary Assessment Scores: Pulmonary Assessment Scores    Row Name 07/20/18 1652 07/22/18 1148       ADL [facility]   ADL Phase  Entry pt will bring paperwork back first day of exericse  Entry    SOB Score total  -  86    Rest  -  3    Walk  -  3    Stairs  -  5    Bath  -  4    Dress  -  2    Shop  -  5      CAT Score   CAT Score  -  26      mMRC Score   mMRC Score  4  -       Pulmonary Function Assessment: Pulmonary Function Assessment - 07/20/18 1646      Initial Spirometry Results   FVC%  48 %    FEV1%  31 %    FEV1/FVC Ratio  53    Comments  Test date 01/15/18      Post Bronchodilator Spirometry Results   FVC%  51 %    FEV1%  29 %    FEV1/FVC Ratio  46    Comments  Test date 01/15/18      Breath   Bilateral Breath Sounds  Wheezes    Shortness of Breath  Yes;Limiting activity;Fear of Shortness of Breath       Exercise Target Goals: Exercise Program Goal: Individual exercise prescription set using results from initial 6 min walk test and THRR while considering  patient's activity barriers and safety.   Exercise Prescription Goal: Initial exercise prescription builds to 30-45 minutes a day of aerobic activity, 2-3 days per week.  Home exercise guidelines will be given to patient during program as part of exercise prescription that the participant will acknowledge.  Activity Barriers & Risk  Stratification: Activity Barriers & Cardiac Risk Stratification - 07/20/18 1639      Activity Barriers & Cardiac Risk  Stratification   Activity Barriers  Balance Concerns;Deconditioning;Muscular Weakness;Shortness of Breath;Back Problems   Hx of low back fracture, osteoprosis in hips and back      6 Minute Walk: 6 Minute Walk    Row Name 07/20/18 1637         6 Minute Walk   Phase  Initial     Distance  800 feet     Walk Time  4.5 minutes     # of Rest Breaks  1 1:30     MPH  2.02     METS  3.2     RPE  19     Perceived Dyspnea   4     VO2 Peak  11.19     Symptoms  Yes (comment)     Comments  SOB     Resting HR  90 bpm     Resting BP  100/60     Resting Oxygen Saturation   93 %     Exercise Oxygen Saturation  during 6 min walk  83 %     Max Ex. HR  133 bpm     Max Ex. BP  118/70     2 Minute Post BP  102/68       Interval HR   1 Minute HR  109     2 Minute HR  120     3 Minute HR  128     4 Minute HR  133     5 Minute HR  117     6 Minute HR  126     2 Minute Post HR  100     Interval Heart Rate?  Yes       Interval Oxygen   Interval Oxygen?  Yes     Baseline Oxygen Saturation %  93 %     1 Minute Oxygen Saturation %  90 %     1 Minute Liters of Oxygen  3 L pulsed     2 Minute Oxygen Saturation %  90 %     2 Minute Liters of Oxygen  3 L     3 Minute Oxygen Saturation %  85 %     3 Minute Liters of Oxygen  3 L     4 Minute Oxygen Saturation %  83 % resting     4 Minute Liters of Oxygen  3 L     5 Minute Oxygen Saturation %  92 %     5 Minute Liters of Oxygen  3 L     6 Minute Oxygen Saturation %  91 %     6 Minute Liters of Oxygen  3 L     2 Minute Post Oxygen Saturation %  92 %     2 Minute Post Liters of Oxygen  3 L       Oxygen Initial Assessment: Oxygen Initial Assessment - 07/20/18 1541      Home Oxygen   Home Oxygen Device  Home Concentrator;Portable Concentrator;E-Tanks    Sleep Oxygen Prescription  Continuous    Liters per minute  3     Home Exercise Oxygen Prescription  Pulsed    Liters per minute  3    Home at Rest Exercise Oxygen Prescription  Continuous    Liters per minute  3    Compliance with Home Oxygen Use  Yes      Initial 6 min Walk   Oxygen Used  Pulsed    Liters  per minute  3      Program Oxygen Prescription   Program Oxygen Prescription  Continuous;E-Tanks    Liters per minute  3      Intervention   Short Term Goals  To learn and exhibit compliance with exercise, home and travel O2 prescription;To learn and understand importance of monitoring SPO2 with pulse oximeter and demonstrate accurate use of the pulse oximeter.;To learn and understand importance of maintaining oxygen saturations>88%;To learn and demonstrate proper pursed lip breathing techniques or other breathing techniques.;To learn and demonstrate proper use of respiratory medications    Long  Term Goals  Exhibits compliance with exercise, home and travel O2 prescription;Verbalizes importance of monitoring SPO2 with pulse oximeter and return demonstration;Maintenance of O2 saturations>88%;Exhibits proper breathing techniques, such as pursed lip breathing or other method taught during program session;Compliance with respiratory medication;Demonstrates proper use of MDI's       Oxygen Re-Evaluation: Oxygen Re-Evaluation    Row Name 07/22/18 1135 08/05/18 1409 09/03/18 1339         Program Oxygen Prescription   Program Oxygen Prescription  -  Continuous;E-Tanks  Continuous;E-Tanks     Liters per minute  -  3  3     Comments  -  patient sometimes uses 4 liters of oxygen.  patient sometimes uses 4 liters of oxygen.       Home Oxygen   Home Oxygen Device  -  Home Concentrator;Portable Concentrator;E-Tanks  Home Concentrator;Portable Concentrator;E-Tanks     Sleep Oxygen Prescription  -  Continuous  Continuous     Liters per minute  -  3  3     Home Exercise Oxygen Prescription  -  Pulsed  Pulsed     Liters per minute  -  3  -     Home at Rest  Exercise Oxygen Prescription  -  Continuous  Continuous     Liters per minute  -  3  3     Compliance with Home Oxygen Use  -  Yes  Yes       Goals/Expected Outcomes   Short Term Goals  -  To learn and exhibit compliance with exercise, home and travel O2 prescription;To learn and understand importance of monitoring SPO2 with pulse oximeter and demonstrate accurate use of the pulse oximeter.;To learn and understand importance of maintaining oxygen saturations>88%;To learn and demonstrate proper pursed lip breathing techniques or other breathing techniques.;To learn and demonstrate proper use of respiratory medications  To learn and exhibit compliance with exercise, home and travel O2 prescription;To learn and understand importance of monitoring SPO2 with pulse oximeter and demonstrate accurate use of the pulse oximeter.;To learn and understand importance of maintaining oxygen saturations>88%;To learn and demonstrate proper pursed lip breathing techniques or other breathing techniques.;To learn and demonstrate proper use of respiratory medications     Long  Term Goals  -  Exhibits compliance with exercise, home and travel O2 prescription;Verbalizes importance of monitoring SPO2 with pulse oximeter and return demonstration;Maintenance of O2 saturations>88%;Exhibits proper breathing techniques, such as pursed lip breathing or other method taught during program session;Compliance with respiratory medication;Demonstrates proper use of MDI's  Exhibits compliance with exercise, home and travel O2 prescription;Verbalizes importance of monitoring SPO2 with pulse oximeter and return demonstration;Maintenance of O2 saturations>88%;Exhibits proper breathing techniques, such as pursed lip breathing or other method taught during program session;Compliance with respiratory medication;Demonstrates proper use of MDI's     Comments  Reviewed PLB technique with pt.  Talked about how it work and  it's important to maintaining  his exercise saturations.    Spoke to patient about COPD Action Plan. Reviewed and talked with the patient about the different levels of COPD severity that they should review on a daily basis and the steps they can take to manage their COPD. Went over pertinent actions for the patient can take when they feel like they are having a COPD exacerbation and the necessary treatment if needed. If patient has any changes with their breathing or feel like they are in a different zone of severity to inform staff for re-evaluation. Patient verbalizes understanding. Copy given to patient. Patient states that her roomate needs to quit smoking so she can quit smoking, She has not had a cigarette in a week. She also states that she has not had an exacerbation in over a year.  Patient states that she has been sick for three weeks and is taking prednisone. She has been wearing 3-4 liters at home for her shortness of breath. She is taking all her medications and using PLB when she is short of breath.     Goals/Expected Outcomes  Short: Become more profiecient at using PLB.   Long: Become independent at using PLB.  Short: Follow COPD action plan. Long: Report any changes and severity of COPD.  Short: use what breathing techniques and exercises patient has learned in [facility] thus far to improved shortness of breath. Long: maintain exercise independently and return to [facility] when available.        Oxygen Discharge (Final Oxygen Re-Evaluation): Oxygen Re-Evaluation - 09/03/18 1339      Program Oxygen Prescription   Program Oxygen Prescription  Continuous;E-Tanks    Liters per minute  3    Comments  patient sometimes uses 4 liters of oxygen.      Home Oxygen   Home Oxygen Device  Home Concentrator;Portable Concentrator;E-Tanks    Sleep Oxygen Prescription  Continuous    Liters per minute  3    Home Exercise Oxygen Prescription  Pulsed    Home at Rest Exercise Oxygen Prescription  Continuous    Liters per minute  3     Compliance with Home Oxygen Use  Yes      Goals/Expected Outcomes   Short Term Goals  To learn and exhibit compliance with exercise, home and travel O2 prescription;To learn and understand importance of monitoring SPO2 with pulse oximeter and demonstrate accurate use of the pulse oximeter.;To learn and understand importance of maintaining oxygen saturations>88%;To learn and demonstrate proper pursed lip breathing techniques or other breathing techniques.;To learn and demonstrate proper use of respiratory medications    Long  Term Goals  Exhibits compliance with exercise, home and travel O2 prescription;Verbalizes importance of monitoring SPO2 with pulse oximeter and return demonstration;Maintenance of O2 saturations>88%;Exhibits proper breathing techniques, such as pursed lip breathing or other method taught during program session;Compliance with respiratory medication;Demonstrates proper use of MDI's    Comments  Patient states that she has been sick for three weeks and is taking prednisone. She has been wearing 3-4 liters at home for her shortness of breath. She is taking all her medications and using PLB when she is short of breath.    Goals/Expected Outcomes  Short: use what breathing techniques and exercises patient has learned in [facility] thus far to improved shortness of breath. Long: maintain exercise independently and return to [facility] when available.       Initial Exercise Prescription: Initial Exercise Prescription - 07/20/18 1600      Date  of Initial Exercise RX and Referring Provider   Date  07/20/18    Referring Provider  Louretta Parma MD      Oxygen   Oxygen  Continuous    Liters  3      Treadmill   MPH  1.7    Grade  0.5    Minutes  15    METs  2.42      NuStep   Level  1    SPM  80    Minutes  15    METs  2      REL-XR   Level  1    Speed  50    Minutes  15    METs  2      Prescription Details   Frequency (times per week)  3    Duration  Progress to  45 minutes of aerobic exercise without signs/symptoms of physical distress      Intensity   THRR 40-80% of Max Heartrate  118-147    Ratings of Perceived Exertion  11-13    Perceived Dyspnea  0-4      Progression   Progression  Continue to progress workloads to maintain intensity without signs/symptoms of physical distress.      Resistance Training   Training Prescription  Yes    Weight  3 lbs    Reps  10-15       Perform Capillary Blood Glucose checks as needed.  Exercise Prescription Changes: Exercise Prescription Changes    Row Name 07/20/18 1600 07/27/18 1500 07/29/18 1000 08/12/18 1200 08/28/18 1000     Response to Exercise   Blood Pressure (Admit)  100/60  126/60  -  134/72  134/60   Blood Pressure (Exercise)  118/70  136/74  -  122/64  -   Blood Pressure (Exit)  102/68  128/64  -  121/64  96/60   Heart Rate (Admit)  90 bpm  88 bpm  -  92 bpm  100 bpm   Heart Rate (Exercise)  133 bpm  85 bpm  -  117 bpm  131 bpm   Heart Rate (Exit)  100 bpm  100 bpm  -  86 bpm  89 bpm   Oxygen Saturation (Admit)  93 %  97 %  -  97 %  94 %   Oxygen Saturation (Exercise)  83 %  88 %  -  94 %  91 %   Oxygen Saturation (Exit)  92 %  92 %  -  98 %  97 %   Rating of Perceived Exertion (Exercise)  19  14  -  12  14   Perceived Dyspnea (Exercise)  4  3  -  1  4   Symptoms  SOB  SOB  -  none  none   Comments  walk test results  first full day of exercise  -  -  -   Duration  -  Progress to 45 minutes of aerobic exercise without signs/symptoms of physical distress  -  Progress to 45 minutes of aerobic exercise without signs/symptoms of physical distress  Continue with 45 min of aerobic exercise without signs/symptoms of physical distress.   Intensity  -  THRR unchanged  -  THRR unchanged  THRR unchanged     Progression   Progression  -  Continue to progress workloads to maintain intensity without signs/symptoms of physical distress.  -  Continue to progress workloads to maintain intensity without  signs/symptoms  of physical distress.  Continue to progress workloads to maintain intensity without signs/symptoms of physical distress.   Average METs  -  2.55  -  2.81  2.94     Resistance Training   Training Prescription  -  Yes  -  Yes  Yes   Weight  -  3 lbs  -  3 lb  3 lb   Reps  -  10-15  -  10-15  10-15     Interval Training   Interval Training  -  No  -  No  No     Oxygen   Oxygen  -  Continuous  -  -  -   Liters  -  3  -  -  -     Treadmill   MPH  -  -  -  1.7  1.7   Grade  -  -  -  0.5  0.5   Minutes  -  -  -  15  15   METs  -  -  -  2.42  2.42     NuStep   Level  -  1  -  -  3   SPM  -  -  -  -  80   Minutes  -  15  -  -  15   METs  -  2.3  -  -  2.7     REL-XR   Level  -  1  -  1  1   Speed  -  -  -  50  50   Minutes  -  15  -  15  15   METs  -  2.8  -  3.2  2.7     Home Exercise Plan   Plans to continue exercise at  -  -  Home (comment) walking  Home (comment) walking  Home (comment) walking   Frequency  -  -  Add 1 additional day to program exercise sessions.  Add 1 additional day to program exercise sessions.  Add 1 additional day to program exercise sessions.   Initial Home Exercises Provided  -  -  07/29/18  07/29/18  07/29/18      Exercise Comments: Exercise Comments    Row Name 07/22/18 1134           Exercise Comments  First full day of exercise!  Patient was oriented to gym and equipment including functions, settings, policies, and procedures.  Patient's individual exercise prescription and treatment plan were reviewed.  All starting workloads were established based on the results of the 6 minute walk test done at initial orientation visit.  The plan for exercise progression was also introduced and progression will be customized based on patient's performance and goals.          Exercise Goals and Review: Exercise Goals    Row Name 07/20/18 1643             Exercise Goals   Increase Physical Activity  Yes       Intervention  Provide  advice, education, support and counseling about physical activity/exercise needs.;Develop an individualized exercise prescription for aerobic and resistive training based on initial evaluation findings, risk stratification, comorbidities and participant's personal goals.       Expected Outcomes  Short Term: Attend rehab on a regular basis to increase amount of physical activity.;Long Term: Add in home exercise to make exercise part of routine and to increase  amount of physical activity.;Long Term: Exercising regularly at least 3-5 days a week.       Increase Strength and Stamina  Yes       Intervention  Provide advice, education, support and counseling about physical activity/exercise needs.;Develop an individualized exercise prescription for aerobic and resistive training based on initial evaluation findings, risk stratification, comorbidities and participant's personal goals.       Expected Outcomes  Short Term: Increase workloads from initial exercise prescription for resistance, speed, and METs.;Long Term: Improve cardiorespiratory fitness, muscular endurance and strength as measured by increased METs and functional capacity (6MWT);Short Term: Perform resistance training exercises routinely during rehab and add in resistance training at home       Able to understand and use rate of perceived exertion (RPE) scale  Yes       Intervention  Provide education and explanation on how to use RPE scale       Expected Outcomes  Short Term: Able to use RPE daily in rehab to express subjective intensity level;Long Term:  Able to use RPE to guide intensity level when exercising independently       Able to understand and use Dyspnea scale  Yes       Intervention  Provide education and explanation on how to use Dyspnea scale       Expected Outcomes  Short Term: Able to use Dyspnea scale daily in rehab to express subjective sense of shortness of breath during exertion;Long Term: Able to use Dyspnea scale to guide  intensity level when exercising independently       Knowledge and understanding of Target Heart Rate Range (THRR)  Yes       Intervention  Provide education and explanation of THRR including how the numbers were predicted and where they are located for reference       Expected Outcomes  Short Term: Able to state/look up THRR;Short Term: Able to use daily as guideline for intensity in rehab;Long Term: Able to use THRR to govern intensity when exercising independently       Able to check pulse independently  Yes       Intervention  Provide education and demonstration on how to check pulse in carotid and radial arteries.;Review the importance of being able to check your own pulse for safety during independent exercise       Expected Outcomes  Short Term: Able to explain why pulse checking is important during independent exercise;Long Term: Able to check pulse independently and accurately       Understanding of Exercise Prescription  Yes       Intervention  Provide education, explanation, and written materials on patient's individual exercise prescription       Expected Outcomes  Short Term: Able to explain program exercise prescription;Long Term: Able to explain home exercise prescription to exercise independently          Exercise Goals Re-Evaluation : Exercise Goals Re-Evaluation    Row Name 07/22/18 1134 07/27/18 1552 07/29/18 1045 08/12/18 1212 08/28/18 1140     Exercise Goal Re-Evaluation   Exercise Goals Review  Increase Physical Activity;Increase Strength and Stamina;Able to understand and use Dyspnea scale;Able to understand and use rate of perceived exertion (RPE) scale;Knowledge and understanding of Target Heart Rate Range (THRR);Understanding of Exercise Prescription  Increase Physical Activity;Increase Strength and Stamina;Understanding of Exercise Prescription  Increase Physical Activity;Able to understand and use rate of perceived exertion (RPE) scale;Knowledge and understanding of  Target Heart Rate Range (THRR);Understanding of Exercise Prescription;Able  to understand and use Dyspnea scale;Increase Strength and Stamina;Able to check pulse independently  Increase Physical Activity;Increase Strength and Stamina;Able to understand and use rate of perceived exertion (RPE) scale;Able to understand and use Dyspnea scale;Knowledge and understanding of Target Heart Rate Range (THRR);Able to check pulse independently;Understanding of Exercise Prescription  Increase Physical Activity;Increase Strength and Stamina;Able to understand and use rate of perceived exertion (RPE) scale;Able to understand and use Dyspnea scale   Comments  Reviewed RPE scale, THR and program prescription with pt today.  Pt voiced understanding and was given a copy of goals to take home.   Nadalie has only completed her first full day of exercise.  She has not been here since then for various reasons.  She has also requested to change classes to the 10am.  We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 are communicating with patient via phone calls and emails.  30 day review completed. ITP sent to Dr. Emily Filbert for  review,changes as needed and signature. Continue with ITP unless changes directed by Dr. Sabra Heck.       Comments:

## 2018-09-21 NOTE — Progress Notes (Signed)
Pulmonary Individual Treatment Plan  Patient Details  Name: Christine Serrano MRN: [id number] Date of Birth: 03-Sep-1960 Referring Provider:     Pulmonary Rehab from 07/20/2018 in [facility] Cardiac and Pulmonary Rehab  Referring Provider  Louretta Parma MD      Initial Encounter Date:    Pulmonary Rehab from 07/20/2018 in [facility] Cardiac and Pulmonary Rehab  Date  07/20/18      Visit Diagnosis: COPD, severe ([place])  Patient's Home Medications on Admission:  Current Outpatient Medications:  .  aspirin EC 81 MG tablet, Take 81 mg by mouth daily., Disp: , Rfl:  .  benzonatate (TESSALON) 100 MG capsule, Take 1 capsule (100 mg total) by mouth 2 (two) times daily as needed for cough., Disp: 30 capsule, Rfl: 1 .  buPROPion (WELLBUTRIN SR) 100 MG 12 hr tablet, TAKE 1 TABLET BY MOUTH EVERY DAY, Disp: 90 tablet, Rfl: 0 .  Cholecalciferol (VITAMIN D) 2000 units tablet, Take 2,000 Units by mouth daily., Disp: , Rfl:  .  clotrimazole (LOTRIMIN) 1 % cream, Apply 1 application topically 2 (two) times daily. For 2 weeks to rash, Disp: 60 g, Rfl: 0 .  fluticasone (FLONASE) 50 MCG/ACT nasal spray, Place 2 sprays into both nostrils daily., Disp: 48 g, Rfl: 3 .  gabapentin (NEURONTIN) 100 MG capsule, Take 1 capsule (100 mg total) by mouth 2 (two) times daily., Disp: 180 capsule, Rfl: 3 .  hydrOXYzine (ATARAX/VISTARIL) 25 MG tablet, Take 1 tablet (25 mg total) by mouth 2 (two) times daily as needed for anxiety., Disp: 180 tablet, Rfl: 0 .  ipratropium-albuterol (DUONEB) 0.5-2.5 (3) MG/3ML SOLN, Inhale 3 mLs into the lungs every 6 (six) hours as needed., Disp: 360 mL, Rfl: 3 .  LINZESS 290 MCG CAPS capsule, Take 1 capsule (290 mcg total) by mouth daily as needed (constipation)., Disp: 30 capsule, Rfl: 3 .  Multiple Vitamins-Minerals (MULTIVITAMIN ADULT) TABS, Take 1 tablet by mouth daily., Disp: , Rfl:  .  naproxen (NAPROSYN) 375 MG tablet, Take 1 tablet (375 mg total) by mouth 2 (two) times daily as needed for moderate  pain., Disp: 40 tablet, Rfl: 1 .  omeprazole (PRILOSEC) 40 MG capsule, Take 1 capsule (40 mg total) by mouth daily. For three weeks then as needed. Take 30 min prior to large meal, Disp: 30 capsule, Rfl: 3 .  OXYGEN, Inhale 3 L/min into the lungs continuous., Disp: , Rfl:  .  polyethylene glycol powder (GLYCOLAX/MIRALAX) powder, Take 17 g by mouth daily as needed for moderate constipation., Disp: 3350 g, Rfl: 1 .  sertraline (ZOLOFT) 100 MG tablet, Take 1.5 tablets (150 mg total) by mouth daily., Disp: 135 tablet, Rfl: 3 .  SYMBICORT 160-4.5 MCG/ACT inhaler, Inhale 2 puffs into the lungs 2 (two) times daily., Disp: 3 Inhaler, Rfl: 3 .  tiotropium (SPIRIVA) 18 MCG inhalation capsule, Place 1 capsule (18 mcg total) into inhaler and inhale daily., Disp: 90 capsule, Rfl: 4 .  traZODone (DESYREL) 50 MG tablet, Take 1 tablet (50 mg total) by mouth at bedtime., Disp: 90 tablet, Rfl: 3 .  VENTOLIN HFA 108 (90 Base) MCG/ACT inhaler, Inhale 2 puffs into the lungs every 6 (six) hours as needed for wheezing or shortness of breath., Disp: 54 g, Rfl: 1 .  vitamin B-12 (CYANOCOBALAMIN) 1000 MCG tablet, Take 1,000 mcg by mouth daily., Disp: , Rfl:  .  vitamin E 400 UNIT capsule, Take 400 Units by mouth daily. , Disp: , Rfl:   Past Medical History: Past Medical History:  Diagnosis Date  . Cervical cancer ([place])   . Chronic idiopathic constipation   . COPD (chronic obstructive pulmonary disease) ([place])   . Depression   . Endometriosis   . HLD (hyperlipidemia)     Tobacco Use: Social History   Tobacco Use  Smoking Status Current Every Day Smoker  . Packs/day: 0.50  . Years: 47.00  . Pack years: 23.50  . Types: Cigarettes  . Last attempt to quit: 07/25/2018  . Years since quitting: 0.1  Smokeless Tobacco Never Used  Tobacco Comment   07/28/18 quit smoking on 07/25/18    Labs: Recent Review Flowsheet Data    Labs for ITP Cardiac and Pulmonary Rehab Latest Ref Rng & Units 04/04/2009 04/09/2016 02/05/2017  02/06/2018   Cholestrol 0 - 200 mg/dL - - 207(H) 181   LDLCALC 0 - 99 mg/dL - - 137(H) 105(H)   HDL >39.00 mg/dL - - 43.60 45.30   Trlycerides 0.0 - 149.0 mg/dL - - 133.0 153.0(H)   PHART 7.350 - 7.450 7.302(L) 7.30(L) - -   PCO2ART 32.0 - 48.0 mmHg 59.5 CRITICAL RESULT CALLED TO, READ BACK BY AND VERIFIED WITH: [facility] 04/04/09 AT 1440 CRITICAL RESULT CALLED TO, READ BACK BY AND VERIFIED WITH: [facility] 04/04/09 AT 1440 BY BROADNAX,L.RRT(HH) 65(H) - -   HCO3 20.0 - 28.0 mmol/L 28.5(H) 32.0(H) - -   TCO2 0 - 100 mmol/L 25.9 - - -   O2SAT % 95.5 88.7 - -       Pulmonary Assessment Scores: Pulmonary Assessment Scores    Row Name 07/20/18 1652 07/22/18 1148       ADL [facility]   ADL Phase  Entry pt will bring paperwork back first day of exericse  Entry    SOB Score total  -  86    Rest  -  3    Walk  -  3    Stairs  -  5    Bath  -  4    Dress  -  2    Shop  -  5      CAT Score   CAT Score  -  26      mMRC Score   mMRC Score  4  -       Pulmonary Function Assessment: Pulmonary Function Assessment - 07/20/18 1646      Initial Spirometry Results   FVC%  48 %    FEV1%  31 %    FEV1/FVC Ratio  53    Comments  Test date 01/15/18      Post Bronchodilator Spirometry Results   FVC%  51 %    FEV1%  29 %    FEV1/FVC Ratio  46    Comments  Test date 01/15/18      Breath   Bilateral Breath Sounds  Wheezes    Shortness of Breath  Yes;Limiting activity;Fear of Shortness of Breath       Exercise Target Goals: Exercise Program Goal: Individual exercise prescription set using results from initial 6 min walk test and THRR while considering  patient's activity barriers and safety.   Exercise Prescription Goal: Initial exercise prescription builds to 30-45 minutes a day of aerobic activity, 2-3 days per week.  Home exercise guidelines will be given to patient during program as part of exercise prescription that the participant will acknowledge.  Activity Barriers & Risk  Stratification: Activity Barriers & Cardiac Risk Stratification - 07/20/18 1639      Activity Barriers & Cardiac Risk  Stratification   Activity Barriers  Balance Concerns;Deconditioning;Muscular Weakness;Shortness of Breath;Back Problems   Hx of low back fracture, osteoprosis in hips and back      6 Minute Walk: 6 Minute Walk    Row Name 07/20/18 1637         6 Minute Walk   Phase  Initial     Distance  800 feet     Walk Time  4.5 minutes     # of Rest Breaks  1 1:30     MPH  2.02     METS  3.2     RPE  19     Perceived Dyspnea   4     VO2 Peak  11.19     Symptoms  Yes (comment)     Comments  SOB     Resting HR  90 bpm     Resting BP  100/60     Resting Oxygen Saturation   93 %     Exercise Oxygen Saturation  during 6 min walk  83 %     Max Ex. HR  133 bpm     Max Ex. BP  118/70     2 Minute Post BP  102/68       Interval HR   1 Minute HR  109     2 Minute HR  120     3 Minute HR  128     4 Minute HR  133     5 Minute HR  117     6 Minute HR  126     2 Minute Post HR  100     Interval Heart Rate?  Yes       Interval Oxygen   Interval Oxygen?  Yes     Baseline Oxygen Saturation %  93 %     1 Minute Oxygen Saturation %  90 %     1 Minute Liters of Oxygen  3 L pulsed     2 Minute Oxygen Saturation %  90 %     2 Minute Liters of Oxygen  3 L     3 Minute Oxygen Saturation %  85 %     3 Minute Liters of Oxygen  3 L     4 Minute Oxygen Saturation %  83 % resting     4 Minute Liters of Oxygen  3 L     5 Minute Oxygen Saturation %  92 %     5 Minute Liters of Oxygen  3 L     6 Minute Oxygen Saturation %  91 %     6 Minute Liters of Oxygen  3 L     2 Minute Post Oxygen Saturation %  92 %     2 Minute Post Liters of Oxygen  3 L       Oxygen Initial Assessment: Oxygen Initial Assessment - 07/20/18 1541      Home Oxygen   Home Oxygen Device  Home Concentrator;Portable Concentrator;E-Tanks    Sleep Oxygen Prescription  Continuous    Liters per minute  3     Home Exercise Oxygen Prescription  Pulsed    Liters per minute  3    Home at Rest Exercise Oxygen Prescription  Continuous    Liters per minute  3    Compliance with Home Oxygen Use  Yes      Initial 6 min Walk   Oxygen Used  Pulsed    Liters  per minute  3      Program Oxygen Prescription   Program Oxygen Prescription  Continuous;E-Tanks    Liters per minute  3      Intervention   Short Term Goals  To learn and exhibit compliance with exercise, home and travel O2 prescription;To learn and understand importance of monitoring SPO2 with pulse oximeter and demonstrate accurate use of the pulse oximeter.;To learn and understand importance of maintaining oxygen saturations>88%;To learn and demonstrate proper pursed lip breathing techniques or other breathing techniques.;To learn and demonstrate proper use of respiratory medications    Long  Term Goals  Exhibits compliance with exercise, home and travel O2 prescription;Verbalizes importance of monitoring SPO2 with pulse oximeter and return demonstration;Maintenance of O2 saturations>88%;Exhibits proper breathing techniques, such as pursed lip breathing or other method taught during program session;Compliance with respiratory medication;Demonstrates proper use of MDI's       Oxygen Re-Evaluation: Oxygen Re-Evaluation    Row Name 07/22/18 1135 08/05/18 1409 09/03/18 1339         Program Oxygen Prescription   Program Oxygen Prescription  -  Continuous;E-Tanks  Continuous;E-Tanks     Liters per minute  -  3  3     Comments  -  patient sometimes uses 4 liters of oxygen.  patient sometimes uses 4 liters of oxygen.       Home Oxygen   Home Oxygen Device  -  Home Concentrator;Portable Concentrator;E-Tanks  Home Concentrator;Portable Concentrator;E-Tanks     Sleep Oxygen Prescription  -  Continuous  Continuous     Liters per minute  -  3  3     Home Exercise Oxygen Prescription  -  Pulsed  Pulsed     Liters per minute  -  3  -     Home at Rest  Exercise Oxygen Prescription  -  Continuous  Continuous     Liters per minute  -  3  3     Compliance with Home Oxygen Use  -  Yes  Yes       Goals/Expected Outcomes   Short Term Goals  -  To learn and exhibit compliance with exercise, home and travel O2 prescription;To learn and understand importance of monitoring SPO2 with pulse oximeter and demonstrate accurate use of the pulse oximeter.;To learn and understand importance of maintaining oxygen saturations>88%;To learn and demonstrate proper pursed lip breathing techniques or other breathing techniques.;To learn and demonstrate proper use of respiratory medications  To learn and exhibit compliance with exercise, home and travel O2 prescription;To learn and understand importance of monitoring SPO2 with pulse oximeter and demonstrate accurate use of the pulse oximeter.;To learn and understand importance of maintaining oxygen saturations>88%;To learn and demonstrate proper pursed lip breathing techniques or other breathing techniques.;To learn and demonstrate proper use of respiratory medications     Long  Term Goals  -  Exhibits compliance with exercise, home and travel O2 prescription;Verbalizes importance of monitoring SPO2 with pulse oximeter and return demonstration;Maintenance of O2 saturations>88%;Exhibits proper breathing techniques, such as pursed lip breathing or other method taught during program session;Compliance with respiratory medication;Demonstrates proper use of MDI's  Exhibits compliance with exercise, home and travel O2 prescription;Verbalizes importance of monitoring SPO2 with pulse oximeter and return demonstration;Maintenance of O2 saturations>88%;Exhibits proper breathing techniques, such as pursed lip breathing or other method taught during program session;Compliance with respiratory medication;Demonstrates proper use of MDI's     Comments  Reviewed PLB technique with pt.  Talked about how it work and  it's important to maintaining  his exercise saturations.    Spoke to patient about COPD Action Plan. Reviewed and talked with the patient about the different levels of COPD severity that they should review on a daily basis and the steps they can take to manage their COPD. Went over pertinent actions for the patient can take when they feel like they are having a COPD exacerbation and the necessary treatment if needed. If patient has any changes with their breathing or feel like they are in a different zone of severity to inform staff for re-evaluation. Patient verbalizes understanding. Copy given to patient. Patient states that her roomate needs to quit smoking so she can quit smoking, She has not had a cigarette in a week. She also states that she has not had an exacerbation in over a year.  Patient states that she has been sick for three weeks and is taking prednisone. She has been wearing 3-4 liters at home for her shortness of breath. She is taking all her medications and using PLB when she is short of breath.     Goals/Expected Outcomes  Short: Become more profiecient at using PLB.   Long: Become independent at using PLB.  Short: Follow COPD action plan. Long: Report any changes and severity of COPD.  Short: use what breathing techniques and exercises patient has learned in [facility] thus far to improved shortness of breath. Long: maintain exercise independently and return to [facility] when available.        Oxygen Discharge (Final Oxygen Re-Evaluation): Oxygen Re-Evaluation - 09/03/18 1339      Program Oxygen Prescription   Program Oxygen Prescription  Continuous;E-Tanks    Liters per minute  3    Comments  patient sometimes uses 4 liters of oxygen.      Home Oxygen   Home Oxygen Device  Home Concentrator;Portable Concentrator;E-Tanks    Sleep Oxygen Prescription  Continuous    Liters per minute  3    Home Exercise Oxygen Prescription  Pulsed    Home at Rest Exercise Oxygen Prescription  Continuous    Liters per minute  3     Compliance with Home Oxygen Use  Yes      Goals/Expected Outcomes   Short Term Goals  To learn and exhibit compliance with exercise, home and travel O2 prescription;To learn and understand importance of monitoring SPO2 with pulse oximeter and demonstrate accurate use of the pulse oximeter.;To learn and understand importance of maintaining oxygen saturations>88%;To learn and demonstrate proper pursed lip breathing techniques or other breathing techniques.;To learn and demonstrate proper use of respiratory medications    Long  Term Goals  Exhibits compliance with exercise, home and travel O2 prescription;Verbalizes importance of monitoring SPO2 with pulse oximeter and return demonstration;Maintenance of O2 saturations>88%;Exhibits proper breathing techniques, such as pursed lip breathing or other method taught during program session;Compliance with respiratory medication;Demonstrates proper use of MDI's    Comments  Patient states that she has been sick for three weeks and is taking prednisone. She has been wearing 3-4 liters at home for her shortness of breath. She is taking all her medications and using PLB when she is short of breath.    Goals/Expected Outcomes  Short: use what breathing techniques and exercises patient has learned in [facility] thus far to improved shortness of breath. Long: maintain exercise independently and return to [facility] when available.       Initial Exercise Prescription: Initial Exercise Prescription - 07/20/18 1600      Date  of Initial Exercise RX and Referring Provider   Date  07/20/18    Referring Provider  Louretta Parma MD      Oxygen   Oxygen  Continuous    Liters  3      Treadmill   MPH  1.7    Grade  0.5    Minutes  15    METs  2.42      NuStep   Level  1    SPM  80    Minutes  15    METs  2      REL-XR   Level  1    Speed  50    Minutes  15    METs  2      Prescription Details   Frequency (times per week)  3    Duration  Progress to  45 minutes of aerobic exercise without signs/symptoms of physical distress      Intensity   THRR 40-80% of Max Heartrate  118-147    Ratings of Perceived Exertion  11-13    Perceived Dyspnea  0-4      Progression   Progression  Continue to progress workloads to maintain intensity without signs/symptoms of physical distress.      Resistance Training   Training Prescription  Yes    Weight  3 lbs    Reps  10-15       Perform Capillary Blood Glucose checks as needed.  Exercise Prescription Changes: Exercise Prescription Changes    Row Name 07/20/18 1600 07/27/18 1500 07/29/18 1000 08/12/18 1200 08/28/18 1000     Response to Exercise   Blood Pressure (Admit)  100/60  126/60  -  134/72  134/60   Blood Pressure (Exercise)  118/70  136/74  -  122/64  -   Blood Pressure (Exit)  102/68  128/64  -  121/64  96/60   Heart Rate (Admit)  90 bpm  88 bpm  -  92 bpm  100 bpm   Heart Rate (Exercise)  133 bpm  85 bpm  -  117 bpm  131 bpm   Heart Rate (Exit)  100 bpm  100 bpm  -  86 bpm  89 bpm   Oxygen Saturation (Admit)  93 %  97 %  -  97 %  94 %   Oxygen Saturation (Exercise)  83 %  88 %  -  94 %  91 %   Oxygen Saturation (Exit)  92 %  92 %  -  98 %  97 %   Rating of Perceived Exertion (Exercise)  19  14  -  12  14   Perceived Dyspnea (Exercise)  4  3  -  1  4   Symptoms  SOB  SOB  -  none  none   Comments  walk test results  first full day of exercise  -  -  -   Duration  -  Progress to 45 minutes of aerobic exercise without signs/symptoms of physical distress  -  Progress to 45 minutes of aerobic exercise without signs/symptoms of physical distress  Continue with 45 min of aerobic exercise without signs/symptoms of physical distress.   Intensity  -  THRR unchanged  -  THRR unchanged  THRR unchanged     Progression   Progression  -  Continue to progress workloads to maintain intensity without signs/symptoms of physical distress.  -  Continue to progress workloads to maintain intensity without  signs/symptoms  of physical distress.  Continue to progress workloads to maintain intensity without signs/symptoms of physical distress.   Average METs  -  2.55  -  2.81  2.94     Resistance Training   Training Prescription  -  Yes  -  Yes  Yes   Weight  -  3 lbs  -  3 lb  3 lb   Reps  -  10-15  -  10-15  10-15     Interval Training   Interval Training  -  No  -  No  No     Oxygen   Oxygen  -  Continuous  -  -  -   Liters  -  3  -  -  -     Treadmill   MPH  -  -  -  1.7  1.7   Grade  -  -  -  0.5  0.5   Minutes  -  -  -  15  15   METs  -  -  -  2.42  2.42     NuStep   Level  -  1  -  -  3   SPM  -  -  -  -  80   Minutes  -  15  -  -  15   METs  -  2.3  -  -  2.7     REL-XR   Level  -  1  -  1  1   Speed  -  -  -  50  50   Minutes  -  15  -  15  15   METs  -  2.8  -  3.2  2.7     Home Exercise Plan   Plans to continue exercise at  -  -  Home (comment) walking  Home (comment) walking  Home (comment) walking   Frequency  -  -  Add 1 additional day to program exercise sessions.  Add 1 additional day to program exercise sessions.  Add 1 additional day to program exercise sessions.   Initial Home Exercises Provided  -  -  07/29/18  07/29/18  07/29/18      Exercise Comments: Exercise Comments    Row Name 07/22/18 1134           Exercise Comments  First full day of exercise!  Patient was oriented to gym and equipment including functions, settings, policies, and procedures.  Patient's individual exercise prescription and treatment plan were reviewed.  All starting workloads were established based on the results of the 6 minute walk test done at initial orientation visit.  The plan for exercise progression was also introduced and progression will be customized based on patient's performance and goals.          Exercise Goals and Review: Exercise Goals    Row Name 07/20/18 1643             Exercise Goals   Increase Physical Activity  Yes       Intervention  Provide  advice, education, support and counseling about physical activity/exercise needs.;Develop an individualized exercise prescription for aerobic and resistive training based on initial evaluation findings, risk stratification, comorbidities and participant's personal goals.       Expected Outcomes  Short Term: Attend rehab on a regular basis to increase amount of physical activity.;Long Term: Add in home exercise to make exercise part of routine and to increase  amount of physical activity.;Long Term: Exercising regularly at least 3-5 days a week.       Increase Strength and Stamina  Yes       Intervention  Provide advice, education, support and counseling about physical activity/exercise needs.;Develop an individualized exercise prescription for aerobic and resistive training based on initial evaluation findings, risk stratification, comorbidities and participant's personal goals.       Expected Outcomes  Short Term: Increase workloads from initial exercise prescription for resistance, speed, and METs.;Long Term: Improve cardiorespiratory fitness, muscular endurance and strength as measured by increased METs and functional capacity (6MWT);Short Term: Perform resistance training exercises routinely during rehab and add in resistance training at home       Able to understand and use rate of perceived exertion (RPE) scale  Yes       Intervention  Provide education and explanation on how to use RPE scale       Expected Outcomes  Short Term: Able to use RPE daily in rehab to express subjective intensity level;Long Term:  Able to use RPE to guide intensity level when exercising independently       Able to understand and use Dyspnea scale  Yes       Intervention  Provide education and explanation on how to use Dyspnea scale       Expected Outcomes  Short Term: Able to use Dyspnea scale daily in rehab to express subjective sense of shortness of breath during exertion;Long Term: Able to use Dyspnea scale to guide  intensity level when exercising independently       Knowledge and understanding of Target Heart Rate Range (THRR)  Yes       Intervention  Provide education and explanation of THRR including how the numbers were predicted and where they are located for reference       Expected Outcomes  Short Term: Able to state/look up THRR;Short Term: Able to use daily as guideline for intensity in rehab;Long Term: Able to use THRR to govern intensity when exercising independently       Able to check pulse independently  Yes       Intervention  Provide education and demonstration on how to check pulse in carotid and radial arteries.;Review the importance of being able to check your own pulse for safety during independent exercise       Expected Outcomes  Short Term: Able to explain why pulse checking is important during independent exercise;Long Term: Able to check pulse independently and accurately       Understanding of Exercise Prescription  Yes       Intervention  Provide education, explanation, and written materials on patient's individual exercise prescription       Expected Outcomes  Short Term: Able to explain program exercise prescription;Long Term: Able to explain home exercise prescription to exercise independently          Exercise Goals Re-Evaluation : Exercise Goals Re-Evaluation    Row Name 07/22/18 1134 07/27/18 1552 07/29/18 1045 08/12/18 1212 08/28/18 1140     Exercise Goal Re-Evaluation   Exercise Goals Review  Increase Physical Activity;Increase Strength and Stamina;Able to understand and use Dyspnea scale;Able to understand and use rate of perceived exertion (RPE) scale;Knowledge and understanding of Target Heart Rate Range (THRR);Understanding of Exercise Prescription  Increase Physical Activity;Increase Strength and Stamina;Understanding of Exercise Prescription  Increase Physical Activity;Able to understand and use rate of perceived exertion (RPE) scale;Knowledge and understanding of  Target Heart Rate Range (THRR);Understanding of Exercise Prescription;Able  to understand and use Dyspnea scale;Increase Strength and Stamina;Able to check pulse independently  Increase Physical Activity;Increase Strength and Stamina;Able to understand and use rate of perceived exertion (RPE) scale;Able to understand and use Dyspnea scale;Knowledge and understanding of Target Heart Rate Range (THRR);Able to check pulse independently;Understanding of Exercise Prescription  Increase Physical Activity;Increase Strength and Stamina;Able to understand and use rate of perceived exertion (RPE) scale;Able to understand and use Dyspnea scale   Comments  Reviewed RPE scale, THR and program prescription with pt today.  Pt voiced understanding and was given a copy of goals to take home.   Elaura has only completed her first full day of exercise.  She has not been here since then for various reasons.  She has also requested to change classes to the 10am.  We will continue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 phone calls and emails.  30 day review completed. ITP sent to Dr. Emily Filbert for  review,changes as needed and signature. Continue with ITP unless changes directed by Dr. Sabra Heck.       Comments:

## 2018-09-21 NOTE — Assessment & Plan Note (Signed)
rec hold flonase, continue nasal saline irrigation.

## 2018-09-21 NOTE — Progress Notes (Signed)
Virtual visit attempted through WebEx, pt unable to use so visit completed using Doxy.Me.  Patient location: home Provider location: [place] at [facility], office If any vitals were documented below, they were collected by patient at home unless specified below.    There were no vitals taken for this visit.   CC: epigastric pain Subjective:    Patient ID: Christine Serrano, female    DOB: 1960/10/04, 58 y.o.   MRN: [id number]  HPI: Christine Serrano is a 58 y.o. female presenting on 09/21/2018 for Abdominal Pain (C/o constant abd pain in epigastric area. Denies any N/V. Started 2-3 wks ago. Says Tums helps.) and Constipation (States she will be constipated for a few days. Then has BM but is diarrhea. Takes Linzess but thinks she may need to also restart Amitiza. )   2-3 wk h/o sharp gripping mid epigastric pain that improves with tums, no aggravating factors. No appetite but no early satiety. Tums helps pain temporarily. Increased burping - feels better after belch. No fevers/chills, nausea/vomiting, blood or mucous in stool, urinary symptoms. Denies significant reflux symptoms, heartburn.   Avoids alcohol. Avoid spicy foods. Does drink sweet tea.   She does regularly take about 4 naprosyn per week and an aspirin daily.   She does have diarrhea alternating with constipation. Regularly takes linzess.  She has not tried miralax or probiotics.   Notes increasing epistaxis while on flonase regularly.  H/o PUD remotely.      Relevant past medical, surgical, family and social history reviewed and updated as indicated. Interim medical history since our last visit reviewed. Allergies and medications reviewed and updated. Outpatient Medications Prior to Visit  Medication Sig Dispense Refill  . aspirin EC 81 MG tablet Take 81 mg by mouth daily.    . benzonatate (TESSALON) 100 MG capsule Take 1 capsule (100 mg total) by mouth 2 (two) times daily as needed for cough. 30 capsule 1  . buPROPion  (WELLBUTRIN SR) 100 MG 12 hr tablet TAKE 1 TABLET BY MOUTH EVERY DAY 90 tablet 0  . Cholecalciferol (VITAMIN D) 2000 units tablet Take 2,000 Units by mouth daily.    . clotrimazole (LOTRIMIN) 1 % cream Apply 1 application topically 2 (two) times daily. For 2 weeks to rash 60 g 0  . fluticasone (FLONASE) 50 MCG/ACT nasal spray Place 2 sprays into both nostrils daily. 48 g 3  . gabapentin (NEURONTIN) 100 MG capsule Take 1 capsule (100 mg total) by mouth 2 (two) times daily. 180 capsule 3  . hydrOXYzine (ATARAX/VISTARIL) 25 MG tablet Take 1 tablet (25 mg total) by mouth 2 (two) times daily as needed for anxiety. 180 tablet 0  . ipratropium-albuterol (DUONEB) 0.5-2.5 (3) MG/3ML SOLN Inhale 3 mLs into the lungs every 6 (six) hours as needed. 360 mL 3  . LINZESS 290 MCG CAPS capsule Take 1 capsule (290 mcg total) by mouth daily as needed (constipation). 30 capsule 3  . Multiple Vitamins-Minerals (MULTIVITAMIN ADULT) TABS Take 1 tablet by mouth daily.    . naproxen (NAPROSYN) 375 MG tablet Take 1 tablet (375 mg total) by mouth 2 (two) times daily as needed for moderate pain. 40 tablet 1  . OXYGEN Inhale 3 L/min into the lungs continuous.    . sertraline (ZOLOFT) 100 MG tablet Take 1.5 tablets (150 mg total) by mouth daily. 135 tablet 3  . SYMBICORT 160-4.5 MCG/ACT inhaler Inhale 2 puffs into the lungs 2 (two) times daily. 3 Inhaler 3  . tiotropium (SPIRIVA) 18  MCG inhalation capsule Place 1 capsule (18 mcg total) into inhaler and inhale daily. 90 capsule 4  . traZODone (DESYREL) 50 MG tablet Take 1 tablet (50 mg total) by mouth at bedtime. 90 tablet 3  . VENTOLIN HFA 108 (90 Base) MCG/ACT inhaler Inhale 2 puffs into the lungs every 6 (six) hours as needed for wheezing or shortness of breath. 54 g 1  . vitamin B-12 (CYANOCOBALAMIN) 1000 MCG tablet Take 1,000 mcg by mouth daily.    . vitamin E 400 UNIT capsule Take 400 Units by mouth daily.     . predniSONE (DELTASONE) 20 MG tablet Take two tablets daily  for 3 days followed by one tablet daily for 4 days 10 tablet 0   No facility-administered medications prior to visit.      Per HPI unless specifically indicated in ROS section below Review of Systems Objective:    There were no vitals taken for this visit.  Wt Readings from Last 3 Encounters:  09/02/18 120 lb 3 oz (54.5 kg)  08/13/18 121 lb (54.9 kg)  07/20/18 115 lb 14.4 oz (52.6 kg)     Physical exam: Gen: alert, NAD, not ill appearing Pulm: speaks in complete sentences without increased work of breathing Psych: normal mood, normal thought content      Results for orders placed or performed in visit on 06/05/18  VITAMIN D 25 Hydroxy (Vit-D Deficiency, Fractures)  Result Value Ref Range   Vit D, 25-Hydroxy 71 30 - 100 ng/mL  Calcium  Result Value Ref Range   Calcium 9.7 8.6 - 10.4 mg/dL   Assessment & Plan:   Problem List Items Addressed This Visit    Tobacco abuse    Continue to encourage smoking cessation.       Nasal sinus congestion   Epistaxis    rec hold flonase, continue nasal saline irrigation.       Epigastric abdominal pain - Primary    Concern for PUD in h/o same. Treat with omeprazole 40mg  daily x 3 wks then PRN. Also discussed avoiding alcohol, spicy foods, acidic foods, caffeine. rec limit naprosyn and hold aspirin for the next several days while omeprazole is started. Update if not improving with treatment for further evaluation. Pt agrees with plan.       Chronic constipation    Increasing constipation despite linzess - will add miralax PRN. Consider probiotic in h/o CIC.       Relevant Medications   polyethylene glycol powder (GLYCOLAX/MIRALAX) powder       Meds ordered this encounter  Medications  . polyethylene glycol powder (GLYCOLAX/MIRALAX) powder    Sig: Take 17 g by mouth daily as needed for moderate constipation.    Dispense:  3350 g    Refill:  1  . omeprazole (PRILOSEC) 40 MG capsule    Sig: Take 1 capsule (40 mg total) by  mouth daily. For three weeks then as needed. Take 30 min prior to large meal    Dispense:  30 capsule    Refill:  3   No orders of the defined types were placed in this encounter.   Follow up plan: No follow-ups on file.  Ria Bush, MD

## 2018-09-21 NOTE — Assessment & Plan Note (Signed)
Concern for PUD in h/o same. Treat with omeprazole 40mg  daily x 3 wks then PRN. Also discussed avoiding alcohol, spicy foods, acidic foods, caffeine. rec limit naprosyn and hold aspirin for the next several days while omeprazole is started. Update if not improving with treatment for further evaluation. Pt agrees with plan.

## 2018-09-21 NOTE — Assessment & Plan Note (Signed)
Increasing constipation despite linzess - will add miralax PRN. Consider probiotic in h/o CIC.

## 2018-09-23 ENCOUNTER — Encounter: Payer: Self-pay | Admitting: *Deleted

## 2018-09-23 ENCOUNTER — Telehealth: Payer: Self-pay | Admitting: Family Medicine

## 2018-09-23 DIAGNOSIS — J449 Chronic obstructive pulmonary disease, unspecified: Secondary | ICD-10-CM

## 2018-09-23 MED ORDER — NICOTINE 7 MG/24HR TD PT24: 7.0000 mg | MEDICATED_PATCH | Freq: Every day | TRANSDERMAL | 3 refills | Status: DC

## 2018-09-23 NOTE — Telephone Encounter (Signed)
Pt want to know if she can get a prescription for nicotine patches.   Sent to [facility]

## 2018-09-23 NOTE — Telephone Encounter (Signed)
Spoke with pt relaying Dr. G's message and instructions.  Pt verbalizes understanding. 

## 2018-09-23 NOTE — Telephone Encounter (Signed)
Sure - I have sent low dose nicotine patches to use 1 a day to her pharmacy. I believe she's currently at 1/2 ppd smoking.  Minimize cigarette use while using patch (to avoid overdose of nicotine). Stop patch use if any chest pain develops.

## 2018-10-13 ENCOUNTER — Telehealth: Payer: Self-pay | Admitting: *Deleted

## 2018-10-13 NOTE — Telephone Encounter (Signed)
Called to check up on patient.  Unable to leave message.  Sent an email.

## 2018-10-20 DIAGNOSIS — J449 Chronic obstructive pulmonary disease, unspecified: Secondary | ICD-10-CM | POA: Diagnosis not present

## 2018-10-27 DIAGNOSIS — J449 Chronic obstructive pulmonary disease, unspecified: Secondary | ICD-10-CM

## 2018-11-04 ENCOUNTER — Encounter: Payer: Self-pay | Admitting: *Deleted

## 2018-11-04 DIAGNOSIS — J449 Chronic obstructive pulmonary disease, unspecified: Secondary | ICD-10-CM

## 2018-11-12 ENCOUNTER — Other Ambulatory Visit: Payer: Self-pay

## 2018-11-12 MED ORDER — SERTRALINE HCL 100 MG PO TABS: 150.0000 mg | ORAL_TABLET | Freq: Every day | ORAL | 1 refills | Status: DC

## 2018-11-12 NOTE — Telephone Encounter (Signed)
E-scribed refill 

## 2018-11-20 ENCOUNTER — Other Ambulatory Visit: Payer: Self-pay | Admitting: Family Medicine

## 2018-11-20 DIAGNOSIS — J449 Chronic obstructive pulmonary disease, unspecified: Secondary | ICD-10-CM | POA: Diagnosis not present

## 2018-11-21 NOTE — Telephone Encounter (Signed)
Last prescribed on 08/13/2018 #180 with 0 refills . Last appointment on 09/21/2018. Next future appointment on 02/11/2019

## 2018-12-01 ENCOUNTER — Ambulatory Visit (INDEPENDENT_AMBULATORY_CARE_PROVIDER_SITE_OTHER): Payer: Medicare Other | Admitting: Family Medicine

## 2018-12-01 ENCOUNTER — Encounter: Payer: Self-pay | Admitting: Family Medicine

## 2018-12-01 VITALS — HR 58 | Temp 98.0°F | Ht 64.5 in | Wt 114.0 lb

## 2018-12-01 DIAGNOSIS — M542 Cervicalgia: Secondary | ICD-10-CM

## 2018-12-01 DIAGNOSIS — S46812A Strain of other muscles, fascia and tendons at shoulder and upper arm level, left arm, initial encounter: Secondary | ICD-10-CM | POA: Insufficient documentation

## 2018-12-01 DIAGNOSIS — R21 Rash and other nonspecific skin eruption: Secondary | ICD-10-CM

## 2018-12-01 MED ORDER — TRIAMCINOLONE ACETONIDE 0.1 % EX CREA: 1.0000 "application " | TOPICAL_CREAM | Freq: Two times a day (BID) | CUTANEOUS | 0 refills | Status: DC

## 2018-12-01 MED ORDER — TIZANIDINE HCL 2 MG PO TABS: 2.0000 mg | ORAL_TABLET | Freq: Two times a day (BID) | ORAL | 0 refills | Status: DC | PRN

## 2018-12-01 NOTE — Progress Notes (Signed)
Virtual visit completed through DoxyMe. Due to national recommendations of social distancing due to COVID-19, a virtual visit is felt to be most appropriate for this patient at this time. Reviewed limitations of a virtual visit.   Patient location: home Provider location: [place] at [facility], office If any vitals were documented, they were collected by patient at home unless specified below.    Pulse (!) 58   Temp 98 F (36.7 C) (Oral)   Ht 5' 4.5" (1.638 m)   Wt 114 lb (51.7 kg)   SpO2 98% Comment: 3 L, continuous  BMI 19.27 kg/m    CC: hives? Subjective:    Patient ID: Christine Serrano, female    DOB: Sep 10, 1960, 58 y.o.   MRN: [id number]  HPI: Christine Serrano is a 58 y.o. female presenting on 12/01/2018 for Urticaria (C/o welts allover. Started 4-5 days ago. Areas are large red patches and very itchy. Tried Benadryl- oral and spray. ) and Neck Pain (C/o left side neck pain. Started about 3 days ago. Tried naproxen and heating pad, not helpful. )   She has recently been feeling well until this week. She weaned herself off her sertraline. Previously on 150mg  daily. Also weaned off wellbutrin. Continues trazodone 50mg  daily. Feels more energy, mood stable.   Over last 3-4 days noticing "welts" throughout back, side, stomach into groin and legs. Started on abdomen. Very itchy. Has tried benadryl (PO and topical). Denies known exposure to poison ivy or sick contacts. Rash spares hands, arms, head.   Noticing increasing neck pain over last few days, describes pain that starts L skull base with radiation down to shoulder. No improvement with ice/heating pad, naprosyn without benefit. No shooting pain or numbness/weakness down arms. Denies inciting trauma/injury. She is taking gabapentin 100mg  bid without benefit.      Relevant past medical, surgical, family and social history reviewed and updated as indicated. Interim medical history since our last visit reviewed. Allergies and  medications reviewed and updated. Outpatient Medications Prior to Visit  Medication Sig Dispense Refill  . aspirin EC 81 MG tablet Take 81 mg by mouth daily.    . benzonatate (TESSALON) 100 MG capsule Take 1 capsule (100 mg total) by mouth 2 (two) times daily as needed for cough. 30 capsule 1  . Cholecalciferol (VITAMIN D) 2000 units tablet Take 2,000 Units by mouth daily.    . clotrimazole (LOTRIMIN) 1 % cream Apply 1 application topically 2 (two) times daily. For 2 weeks to rash 60 g 0  . fluticasone (FLONASE) 50 MCG/ACT nasal spray Place 2 sprays into both nostrils daily. 48 g 3  . gabapentin (NEURONTIN) 100 MG capsule Take 1 capsule (100 mg total) by mouth 2 (two) times daily. 180 capsule 3  . hydrOXYzine (ATARAX/VISTARIL) 25 MG tablet TAKE 1 TABLET (25 MG TOTAL) BY MOUTH 2 (TWO) TIMES DAILY AS NEEDED FOR ANXIETY. 180 tablet 0  . ipratropium-albuterol (DUONEB) 0.5-2.5 (3) MG/3ML SOLN Inhale 3 mLs into the lungs every 6 (six) hours as needed. 360 mL 3  . LINZESS 290 MCG CAPS capsule Take 1 capsule (290 mcg total) by mouth daily as needed (constipation). 30 capsule 3  . Multiple Vitamins-Minerals (MULTIVITAMIN ADULT) TABS Take 1 tablet by mouth daily.    . naproxen (NAPROSYN) 375 MG tablet Take 1 tablet (375 mg total) by mouth 2 (two) times daily as needed for moderate pain. 40 tablet 1  . nicotine (NICODERM CQ) 7 mg/24hr patch Place 1 patch (7 mg total)  onto the skin daily. 28 patch 3  . omeprazole (PRILOSEC) 40 MG capsule Take 1 capsule (40 mg total) by mouth daily. For three weeks then as needed. Take 30 min prior to large meal 30 capsule 3  . OXYGEN Inhale 3 L/min into the lungs continuous.    . polyethylene glycol powder (GLYCOLAX/MIRALAX) powder Take 17 g by mouth daily as needed for moderate constipation. 3350 g 1  . SYMBICORT 160-4.5 MCG/ACT inhaler Inhale 2 puffs into the lungs 2 (two) times daily. 3 Inhaler 3  . tiotropium (SPIRIVA) 18 MCG inhalation capsule Place 1 capsule (18 mcg  total) into inhaler and inhale daily. 90 capsule 4  . traZODone (DESYREL) 50 MG tablet Take 1 tablet (50 mg total) by mouth at bedtime. 90 tablet 3  . VENTOLIN HFA 108 (90 Base) MCG/ACT inhaler Inhale 2 puffs into the lungs every 6 (six) hours as needed for wheezing or shortness of breath. 54 g 1  . vitamin B-12 (CYANOCOBALAMIN) 1000 MCG tablet Take 1,000 mcg by mouth daily.    . vitamin E 400 UNIT capsule Take 400 Units by mouth daily.     Marland Kitchen buPROPion (WELLBUTRIN SR) 100 MG 12 hr tablet TAKE 1 TABLET BY MOUTH EVERY DAY 90 tablet 0  . sertraline (ZOLOFT) 100 MG tablet Take 1.5 tablets (150 mg total) by mouth daily. (Patient not taking: Reported on 12/01/2018) 135 tablet 1   No facility-administered medications prior to visit.      Per HPI unless specifically indicated in ROS section below Review of Systems Objective:    Pulse (!) 58   Temp 98 F (36.7 C) (Oral)   Ht 5' 4.5" (1.638 m)   Wt 114 lb (51.7 kg)   SpO2 98% Comment: 3 L, continuous  BMI 19.27 kg/m   Wt Readings from Last 3 Encounters:  12/01/18 114 lb (51.7 kg)  09/02/18 120 lb 3 oz (54.5 kg)  08/13/18 121 lb (54.9 kg)     Physical exam: Gen: alert, NAD, not ill appearing Pulm: speaks in complete sentences without increased work of breathing Psych: normal mood, normal thought content  Skin: excoriated papules on erythematous base, clusters along L lower ribcage, inferior to umbilicus, R inner thigh, L neck    Assessment & Plan:   Problem List Items Addressed This Visit    Skin rash - Primary    Not consistent with shingles or hives. Not along distribution of scabies. Possible bug bites ?bed bugs. rec wash all bedding with hot water, rec TCI cream, ok to continue benadryl PRN. rec in office visit if not improving with this. Pt agrees with plan.       Neck pain    Worse over last few days without red flags. Will treat as neck strain with muscle relaxant, gentle stretching, heating pad. rec in office visit if no  improvement.           Meds ordered this encounter  Medications  . triamcinolone cream (KENALOG) 0.1 %    Sig: Apply 1 application topically 2 (two) times daily. Apply to AA.    Dispense:  45 g    Refill:  0  . tiZANidine (ZANAFLEX) 2 MG tablet    Sig: Take 1 tablet (2 mg total) by mouth 2 (two) times daily as needed for muscle spasms (neck pain).    Dispense:  30 tablet    Refill:  0   No orders of the defined types were placed in this encounter.   I  discussed the assessment and treatment plan with the patient. The patient was provided an opportunity to ask questions and all were answered. The patient agreed with the plan and demonstrated an understanding of the instructions. The patient was advised to call back or seek an in-person evaluation if the symptoms worsen or if the condition fails to improve as anticipated.  Follow up plan: Return if symptoms worsen or fail to improve.  Ria Bush, MD

## 2018-12-01 NOTE — Assessment & Plan Note (Signed)
Not consistent with shingles or hives. Not along distribution of scabies. Possible bug bites ?bed bugs. rec wash all bedding with hot water, rec TCI cream, ok to continue benadryl PRN. rec in office visit if not improving with this. Pt agrees with plan.

## 2018-12-01 NOTE — Assessment & Plan Note (Signed)
Worse over last few days without red flags. Will treat as neck strain with muscle relaxant, gentle stretching, heating pad. rec in office visit if no improvement.

## 2018-12-02 ENCOUNTER — Encounter: Payer: Self-pay | Admitting: *Deleted

## 2018-12-04 ENCOUNTER — Encounter: Payer: Self-pay | Admitting: Family Medicine

## 2018-12-04 ENCOUNTER — Ambulatory Visit (INDEPENDENT_AMBULATORY_CARE_PROVIDER_SITE_OTHER): Payer: Medicare Other | Admitting: Family Medicine

## 2018-12-04 ENCOUNTER — Other Ambulatory Visit: Payer: Self-pay | Admitting: Family Medicine

## 2018-12-04 DIAGNOSIS — S46812A Strain of other muscles, fascia and tendons at shoulder and upper arm level, left arm, initial encounter: Secondary | ICD-10-CM | POA: Diagnosis not present

## 2018-12-04 NOTE — Assessment & Plan Note (Signed)
Neck pain largely resolved. Now describes L trap strain. Supportive care reviewed. Continue zanaflex (tolerating well), add tylenol for pain, continue heating pad and gentle stretching. No red flags.

## 2018-12-04 NOTE — Progress Notes (Signed)
Virtual visit completed through Doxy.Me. Due to national recommendations of social distancing due to COVID-19, a virtual visit is felt to be most appropriate for this patient at this time. Reviewed limitations of a virtual visit.   Patient location: home Provider location: [place] at [facility] office If any vitals were documented, they were collected by patient at home unless specified below.    Pulse 93   Temp 98.2 F (36.8 C) (Oral)   Ht 5' 4.5" (1.638 m)   Wt 115 lb (52.2 kg)   SpO2 100%   BMI 19.43 kg/m    CC: ongoing neck pain Subjective:    Patient ID: Christine Serrano, female    DOB: 1960/07/19, 58 y.o.   MRN: [id number]  HPI: Christine Serrano is a 58 y.o. female presenting on 12/04/2018 for Neck Pain (C/o left side neck pain not getting better and now radiating down into shoulder.  )   See prior note for details.  Seen here earlier this week with neck pain thought neck strain treated with zanaflex 2mg  bid PRN and conservative measures with some benefit. Now notes pain radiating from midline neck into posterior L shoulder. Also using heating pad and neck stretching with benefit.   Denies shooting pain down, numbness/weakness down arm.       Relevant past medical, surgical, family and social history reviewed and updated as indicated. Interim medical history since our last visit reviewed. Allergies and medications reviewed and updated. Outpatient Medications Prior to Visit  Medication Sig Dispense Refill  . aspirin EC 81 MG tablet Take 81 mg by mouth daily.    . benzonatate (TESSALON) 100 MG capsule Take 1 capsule (100 mg total) by mouth 2 (two) times daily as needed for cough. 30 capsule 1  . Cholecalciferol (VITAMIN D) 2000 units tablet Take 2,000 Units by mouth daily.    . clotrimazole (LOTRIMIN) 1 % cream Apply 1 application topically 2 (two) times daily. For 2 weeks to rash 60 g 0  . fluticasone (FLONASE) 50 MCG/ACT nasal spray Place 2 sprays into both nostrils  daily. 48 g 3  . gabapentin (NEURONTIN) 100 MG capsule Take 1 capsule (100 mg total) by mouth 2 (two) times daily. 180 capsule 3  . hydrOXYzine (ATARAX/VISTARIL) 25 MG tablet TAKE 1 TABLET (25 MG TOTAL) BY MOUTH 2 (TWO) TIMES DAILY AS NEEDED FOR ANXIETY. 180 tablet 0  . ipratropium-albuterol (DUONEB) 0.5-2.5 (3) MG/3ML SOLN Inhale 3 mLs into the lungs every 6 (six) hours as needed. 360 mL 3  . LINZESS 290 MCG CAPS capsule Take 1 capsule (290 mcg total) by mouth daily as needed (constipation). 30 capsule 3  . Multiple Vitamins-Minerals (MULTIVITAMIN ADULT) TABS Take 1 tablet by mouth daily.    . naproxen (NAPROSYN) 375 MG tablet Take 1 tablet (375 mg total) by mouth 2 (two) times daily as needed for moderate pain. 40 tablet 1  . nicotine (NICODERM CQ) 7 mg/24hr patch Place 1 patch (7 mg total) onto the skin daily. 28 patch 3  . omeprazole (PRILOSEC) 40 MG capsule Take 1 capsule (40 mg total) by mouth daily. For three weeks then as needed. Take 30 min prior to large meal 30 capsule 3  . OXYGEN Inhale 3 L/min into the lungs continuous.    . polyethylene glycol powder (GLYCOLAX/MIRALAX) powder Take 17 g by mouth daily as needed for moderate constipation. 3350 g 1  . SYMBICORT 160-4.5 MCG/ACT inhaler Inhale 2 puffs into the lungs 2 (two) times daily. 3  Inhaler 3  . tiotropium (SPIRIVA) 18 MCG inhalation capsule Place 1 capsule (18 mcg total) into inhaler and inhale daily. 90 capsule 4  . tiZANidine (ZANAFLEX) 2 MG tablet Take 1 tablet (2 mg total) by mouth 2 (two) times daily as needed for muscle spasms (neck pain). 30 tablet 0  . traZODone (DESYREL) 50 MG tablet Take 1 tablet (50 mg total) by mouth at bedtime. 90 tablet 3  . triamcinolone cream (KENALOG) 0.1 % Apply 1 application topically 2 (two) times daily. Apply to AA. 45 g 0  . VENTOLIN HFA 108 (90 Base) MCG/ACT inhaler Inhale 2 puffs into the lungs every 6 (six) hours as needed for wheezing or shortness of breath. 54 g 1  . vitamin B-12  (CYANOCOBALAMIN) 1000 MCG tablet Take 1,000 mcg by mouth daily.    . vitamin E 400 UNIT capsule Take 400 Units by mouth daily.      No facility-administered medications prior to visit.      Per HPI unless specifically indicated in ROS section below Review of Systems Objective:    Pulse 93   Temp 98.2 F (36.8 C) (Oral)   Ht 5' 4.5" (1.638 m)   Wt 115 lb (52.2 kg)   SpO2 100%   BMI 19.43 kg/m   Wt Readings from Last 3 Encounters:  12/04/18 115 lb (52.2 kg)  12/01/18 114 lb (51.7 kg)  09/02/18 120 lb 3 oz (54.5 kg)     Physical exam: Gen: alert, NAD, not ill appearing Pulm: speaks in complete sentences without increased work of breathing Psych: normal mood, normal thought content  MSK: FROM at neck, FROM at shoulders. Points to L trapezius mm as site of pain     Results for orders placed or performed in visit on 06/05/18  VITAMIN D 25 Hydroxy (Vit-D Deficiency, Fractures)  Result Value Ref Range   Vit D, 25-Hydroxy 71 30 - 100 ng/mL  Calcium  Result Value Ref Range   Calcium 9.7 8.6 - 10.4 mg/dL   Assessment & Plan:   Problem List Items Addressed This Visit    Trapezius muscle strain, left, initial encounter    Neck pain largely resolved. Now describes L trap strain. Supportive care reviewed. Continue zanaflex (tolerating well), add tylenol for pain, continue heating pad and gentle stretching. No red flags.           No orders of the defined types were placed in this encounter.  No orders of the defined types were placed in this encounter.   I discussed the assessment and treatment plan with the patient. The patient was provided an opportunity to ask questions and all were answered. The patient agreed with the plan and demonstrated an understanding of the instructions. The patient was advised to call back or seek an in-person evaluation if the symptoms worsen or if the condition fails to improve as anticipated.  Follow up plan: No follow-ups on file.  Ria Bush, MD

## 2018-12-08 ENCOUNTER — Other Ambulatory Visit: Payer: Self-pay | Admitting: Family Medicine

## 2018-12-08 NOTE — Telephone Encounter (Signed)
CPE scheduled on 02/25/19, last filled on 12/01/18 #30 with sig of BID Prn

## 2018-12-17 ENCOUNTER — Telehealth: Payer: Self-pay

## 2018-12-17 ENCOUNTER — Other Ambulatory Visit: Payer: Self-pay | Admitting: Family Medicine

## 2018-12-17 NOTE — Telephone Encounter (Signed)
Pt is asking what she needs to do to have next Prolia inj.  Looks like her last one was 05/18/18.

## 2018-12-20 DIAGNOSIS — J449 Chronic obstructive pulmonary disease, unspecified: Secondary | ICD-10-CM | POA: Diagnosis not present

## 2018-12-21 NOTE — Telephone Encounter (Signed)
Prolia benefits submitted.  Pt informed would schedule to have this month as soon as receive benefit information.

## 2018-12-23 ENCOUNTER — Other Ambulatory Visit: Payer: Self-pay | Admitting: Family Medicine

## 2018-12-23 NOTE — Telephone Encounter (Signed)
Electronic refill request tizanidine  Last refill 12/10/18 #30 Last office visit 12/04/18

## 2018-12-24 ENCOUNTER — Telehealth: Payer: Self-pay | Admitting: Family Medicine

## 2018-12-24 NOTE — Telephone Encounter (Signed)
Patient said she spoke to [facility] ([place]) and they need a rx faxed to them for a scooter.  Patient said Dr.G is aware she needs a scooter.  Fax rx to [phone] ATTN: Referral Support.

## 2018-12-24 NOTE — Telephone Encounter (Signed)
Recommend schedule appt with dentist.

## 2018-12-24 NOTE — Telephone Encounter (Signed)
Prolia benefits discussed.  No administration fee.  Pt would owe approximately $230 (20% for Prolia).

## 2018-12-24 NOTE — Telephone Encounter (Signed)
Pt c/o her front gums only are red and hurt.  Pt wear dentures and states her gums have shrunk and, "It's like I have no gums at all".  She doesn't think she needs to see a dentist but questions should she make an appt to see you?  No current Covid sxs.

## 2018-12-25 NOTE — Telephone Encounter (Signed)
Spoke with pt relaying Dr. G's message. Pt verbalizes understanding.  

## 2018-12-29 NOTE — Telephone Encounter (Signed)
Spoke with Darlina Guys of [place] asking about a form for scooter.  States Dr. Darnell Level can enter the order in Epic or do a written rx which can be faxed to them.

## 2018-12-29 NOTE — Telephone Encounter (Signed)
plz have [employer] send me forms to review for scooter.  As far as I am aware, insurance will not cover scooter use.

## 2018-12-31 ENCOUNTER — Ambulatory Visit: Payer: Medicare Other

## 2019-01-13 ENCOUNTER — Encounter: Payer: Self-pay | Admitting: *Deleted

## 2019-01-13 DIAGNOSIS — J449 Chronic obstructive pulmonary disease, unspecified: Secondary | ICD-10-CM

## 2019-01-14 ENCOUNTER — Other Ambulatory Visit: Payer: Self-pay | Admitting: Family Medicine

## 2019-01-18 ENCOUNTER — Ambulatory Visit: Payer: Medicare Other | Admitting: Family Medicine

## 2019-01-18 ENCOUNTER — Encounter: Payer: Medicare Other | Attending: Family Medicine

## 2019-01-18 DIAGNOSIS — J449 Chronic obstructive pulmonary disease, unspecified: Secondary | ICD-10-CM | POA: Insufficient documentation

## 2019-01-18 DIAGNOSIS — J9611 Chronic respiratory failure with hypoxia: Secondary | ICD-10-CM | POA: Insufficient documentation

## 2019-01-20 ENCOUNTER — Telehealth: Payer: Self-pay

## 2019-01-20 DIAGNOSIS — J449 Chronic obstructive pulmonary disease, unspecified: Secondary | ICD-10-CM | POA: Diagnosis not present

## 2019-01-20 NOTE — Telephone Encounter (Signed)
Left message for patient to call. Patient has missed two rehab appointments.

## 2019-01-21 ENCOUNTER — Ambulatory Visit (INDEPENDENT_AMBULATORY_CARE_PROVIDER_SITE_OTHER): Payer: Medicare Other

## 2019-01-21 DIAGNOSIS — M810 Age-related osteoporosis without current pathological fracture: Secondary | ICD-10-CM

## 2019-01-21 MED ORDER — DENOSUMAB 60 MG/ML ~~LOC~~ SOSY
60.0000 mg | PREFILLED_SYRINGE | Freq: Once | SUBCUTANEOUS | Status: AC
  Administered 2019-01-21: 16:00:00 60 mg via SUBCUTANEOUS

## 2019-01-21 NOTE — Progress Notes (Signed)
Per orders of Dr. Gutierrez, injection of Prolia given by Carys Malina. Patient tolerated injection well.  

## 2019-01-27 ENCOUNTER — Encounter: Payer: Self-pay | Admitting: *Deleted

## 2019-01-27 ENCOUNTER — Encounter: Payer: Medicare Other | Admitting: *Deleted

## 2019-01-27 ENCOUNTER — Other Ambulatory Visit: Payer: Self-pay

## 2019-01-27 DIAGNOSIS — J449 Chronic obstructive pulmonary disease, unspecified: Secondary | ICD-10-CM

## 2019-01-27 DIAGNOSIS — J9611 Chronic respiratory failure with hypoxia: Secondary | ICD-10-CM | POA: Diagnosis not present

## 2019-01-27 NOTE — Progress Notes (Signed)
Pulmonary Individual Treatment Plan  Patient Details  Name: Christine Serrano MRN: [id number] Date of Birth: 02-04-1961 Referring Provider:     Pulmonary Rehab from 07/20/2018 in [facility] Cardiac and Pulmonary Rehab  Referring Provider  Louretta Parma MD      Initial Encounter Date:    Pulmonary Rehab from 07/20/2018 in [facility] Cardiac and Pulmonary Rehab  Date  07/20/18      Visit Diagnosis: COPD, severe ([place])  Patient's Home Medications on Admission:  Current Outpatient Medications:  .  aspirin EC 81 MG tablet, Take 81 mg by mouth daily., Disp: , Rfl:  .  benzonatate (TESSALON) 100 MG capsule, Take 1 capsule (100 mg total) by mouth 2 (two) times daily as needed for cough., Disp: 30 capsule, Rfl: 1 .  buPROPion (WELLBUTRIN SR) 100 MG 12 hr tablet, TAKE 1 TABLET BY MOUTH EVERY DAY, Disp: 90 tablet, Rfl: 0 .  Cholecalciferol (VITAMIN D) 2000 units tablet, Take 2,000 Units by mouth daily., Disp: , Rfl:  .  clotrimazole (LOTRIMIN) 1 % cream, Apply 1 application topically 2 (two) times daily. For 2 weeks to rash, Disp: 60 g, Rfl: 0 .  fluticasone (FLONASE) 50 MCG/ACT nasal spray, Place 2 sprays into both nostrils daily., Disp: 48 g, Rfl: 3 .  gabapentin (NEURONTIN) 100 MG capsule, Take 1 capsule (100 mg total) by mouth 2 (two) times daily., Disp: 180 capsule, Rfl: 3 .  hydrOXYzine (ATARAX/VISTARIL) 25 MG tablet, TAKE 1 TABLET (25 MG TOTAL) BY MOUTH 2 (TWO) TIMES DAILY AS NEEDED FOR ANXIETY., Disp: 180 tablet, Rfl: 0 .  ipratropium-albuterol (DUONEB) 0.5-2.5 (3) MG/3ML SOLN, Inhale 3 mLs into the lungs every 6 (six) hours as needed., Disp: 360 mL, Rfl: 3 .  LINZESS 290 MCG CAPS capsule, Take 1 capsule (290 mcg total) by mouth daily as needed (constipation)., Disp: 30 capsule, Rfl: 3 .  Multiple Vitamins-Minerals (MULTIVITAMIN ADULT) TABS, Take 1 tablet by mouth daily., Disp: , Rfl:  .  naproxen (NAPROSYN) 375 MG tablet, Take 1 tablet (375 mg total) by mouth 2 (two) times daily as needed for moderate  pain., Disp: 40 tablet, Rfl: 1 .  nicotine (NICODERM CQ) 7 mg/24hr patch, Place 1 patch (7 mg total) onto the skin daily., Disp: 28 patch, Rfl: 3 .  omeprazole (PRILOSEC) 40 MG capsule, TAKE 1 CAPSULE BY MOUTH DAILY. FOR THREE WEEKS THEN AS NEEDED. TAKE 30 MIN PRIOR TO LARGE MEAL, Disp: 90 capsule, Rfl: 1 .  OXYGEN, Inhale 3 L/min into the lungs continuous., Disp: , Rfl:  .  polyethylene glycol powder (GLYCOLAX/MIRALAX) powder, Take 17 g by mouth daily as needed for moderate constipation., Disp: 3350 g, Rfl: 1 .  PROAIR HFA 108 (90 Base) MCG/ACT inhaler, TAKE 2 PUFFS BY MOUTH EVERY 6 HOURS AS NEEDED FOR WHEEZE OR SHORTNESS OF BREATH, Disp: 54 g, Rfl: 1 .  SYMBICORT 160-4.5 MCG/ACT inhaler, Inhale 2 puffs into the lungs 2 (two) times daily., Disp: 3 Inhaler, Rfl: 3 .  tiotropium (SPIRIVA) 18 MCG inhalation capsule, Place 1 capsule (18 mcg total) into inhaler and inhale daily., Disp: 90 capsule, Rfl: 4 .  tiZANidine (ZANAFLEX) 2 MG tablet, TAKE 1 TABLET (2 MG TOTAL) BY MOUTH 2 (TWO) TIMES DAILY AS NEEDED FOR MUSCLE SPASMS (NECK PAIN)., Disp: 60 tablet, Rfl: 1 .  traZODone (DESYREL) 50 MG tablet, Take 1 tablet (50 mg total) by mouth at bedtime., Disp: 90 tablet, Rfl: 3 .  triamcinolone cream (KENALOG) 0.1 %, APPLY TO AFFECTED AREA TWICE A DAY, Disp: 45  g, Rfl: 0 .  vitamin B-12 (CYANOCOBALAMIN) 1000 MCG tablet, Take 1,000 mcg by mouth daily., Disp: , Rfl:  .  vitamin E 400 UNIT capsule, Take 400 Units by mouth daily. , Disp: , Rfl:   Past Medical History: Past Medical History:  Diagnosis Date  . Cervical cancer ([place])   . Chronic idiopathic constipation   . COPD (chronic obstructive pulmonary disease) ([place])   . Depression   . Endometriosis   . HLD (hyperlipidemia)     Tobacco Use: Social History   Tobacco Use  Smoking Status Current Every Day Smoker  . Packs/day: 0.50  . Years: 47.00  . Pack years: 23.50  . Types: Cigarettes  . Last attempt to quit: 07/25/2018  . Years since quitting:  0.5  Smokeless Tobacco Never Used  Tobacco Comment   09/23/18 Still at 1/2 ppd and out of patches but plans to get more    Labs: Recent Review Flowsheet Data    Labs for ITP Cardiac and Pulmonary Rehab Latest Ref Rng & Units 04/04/2009 04/09/2016 02/05/2017 02/06/2018   Cholestrol 0 - 200 mg/dL - - 207(H) 181   LDLCALC 0 - 99 mg/dL - - 137(H) 105(H)   HDL >39.00 mg/dL - - 43.60 45.30   Trlycerides 0.0 - 149.0 mg/dL - - 133.0 153.0(H)   PHART 7.350 - 7.450 7.302(L) 7.30(L) - -   PCO2ART 32.0 - 48.0 mmHg 59.5 CRITICAL RESULT CALLED TO, READ BACK BY AND VERIFIED WITH: [facility] 04/04/09 AT 1440 CRITICAL RESULT CALLED TO, READ BACK BY AND VERIFIED WITH: [facility] 04/04/09 AT 1440 BY BROADNAX,L.RRT(HH) 65(H) - -   HCO3 20.0 - 28.0 mmol/L 28.5(H) 32.0(H) - -   TCO2 0 - 100 mmol/L 25.9 - - -   O2SAT % 95.5 88.7 - -       Pulmonary Assessment Scores:   UCSD: Self-administered rating of dyspnea associated with activities of daily living (ADLs) 6-point scale (0 = "not at all" to 5 = "maximal or unable to do because of breathlessness")  Scoring Scores range from 0 to 120.  Minimally important difference is 5 units  CAT: CAT can identify the health impairment of COPD patients and is better correlated with disease progression.  CAT has a scoring range of zero to 40. The CAT score is classified into four groups of low (less than 10), medium (10 - 20), high (21-30) and very high (31-40) based on the impact level of disease on health status. A CAT score over 10 suggests significant symptoms.  A worsening CAT score could be explained by an exacerbation, poor medication adherence, poor inhaler technique, or progression of COPD or comorbid conditions.  CAT MCID is 2 points  mMRC: mMRC (Modified Medical Research Council) Dyspnea Scale is used to assess the degree of baseline functional disability in patients of respiratory disease due to dyspnea. No minimal important difference is established. A  decrease in score of 1 point or greater is considered a positive change.   Pulmonary Function Assessment:   Exercise Target Goals: Exercise Program Goal: Individual exercise prescription set using results from initial 6 min walk test and THRR while considering  patient's activity barriers and safety.   Exercise Prescription Goal: Initial exercise prescription builds to 30-45 minutes a day of aerobic activity, 2-3 days per week.  Home exercise guidelines will be given to patient during program as part of exercise prescription that the participant will acknowledge.  Activity Barriers & Risk Stratification:   6 Minute Walk:  Oxygen Initial  Assessment:   Oxygen Re-Evaluation: Oxygen Re-Evaluation    Row Name 08/05/18 1409 09/03/18 1339 09/23/18 1109 11/04/18 1029 12/02/18 1210     Program Oxygen Prescription   Program Oxygen Prescription  Continuous;E-Tanks  Continuous;E-Tanks  Continuous;E-Tanks  Continuous;E-Tanks  Continuous;E-Tanks   Liters per minute  '3  3  3  3  3   '$ Comments  patient sometimes uses 4 liters of oxygen.  patient sometimes uses 4 liters of oxygen.  patient sometimes uses 4 liters of oxygen when walking   -  -     Home Oxygen   Home Oxygen Device  Home Concentrator;Portable Concentrator;E-Tanks  Home Concentrator;Portable Concentrator;E-Tanks  Home Concentrator;Portable Concentrator;E-Tanks  Home Concentrator;Portable Concentrator;E-Tanks  Home Concentrator;Portable Concentrator;E-Tanks   Sleep Oxygen Prescription  Continuous  Continuous  Continuous  Continuous  Continuous   Liters per minute  '3  3  3  '$ -  3   Home Exercise Oxygen Prescription  Pulsed  Pulsed  Pulsed  Pulsed  Pulsed   Liters per minute  3  -  '3  3  3   '$ Home at Rest Exercise Oxygen Prescription  Continuous  Continuous  Continuous  Continuous  Continuous   Liters per minute  3  3  -  3  -   Compliance with Home Oxygen Use  Yes  Yes  Yes  Yes  Yes     Goals/Expected Outcomes   Short Term Goals  To  learn and exhibit compliance with exercise, home and travel O2 prescription;To learn and understand importance of monitoring SPO2 with pulse oximeter and demonstrate accurate use of the pulse oximeter.;To learn and understand importance of maintaining oxygen saturations>88%;To learn and demonstrate proper pursed lip breathing techniques or other breathing techniques.;To learn and demonstrate proper use of respiratory medications  To learn and exhibit compliance with exercise, home and travel O2 prescription;To learn and understand importance of monitoring SPO2 with pulse oximeter and demonstrate accurate use of the pulse oximeter.;To learn and understand importance of maintaining oxygen saturations>88%;To learn and demonstrate proper pursed lip breathing techniques or other breathing techniques.;To learn and demonstrate proper use of respiratory medications  To learn and exhibit compliance with exercise, home and travel O2 prescription;To learn and understand importance of monitoring SPO2 with pulse oximeter and demonstrate accurate use of the pulse oximeter.;To learn and understand importance of maintaining oxygen saturations>88%;To learn and demonstrate proper pursed lip breathing techniques or other breathing techniques.;To learn and demonstrate proper use of respiratory medications  To learn and exhibit compliance with exercise, home and travel O2 prescription;To learn and understand importance of monitoring SPO2 with pulse oximeter and demonstrate accurate use of the pulse oximeter.;To learn and understand importance of maintaining oxygen saturations>88%;To learn and demonstrate proper pursed lip breathing techniques or other breathing techniques.;To learn and demonstrate proper use of respiratory medications  To learn and exhibit compliance with exercise, home and travel O2 prescription;To learn and understand importance of monitoring SPO2 with pulse oximeter and demonstrate accurate use of the pulse  oximeter.;To learn and understand importance of maintaining oxygen saturations>88%;To learn and demonstrate proper pursed lip breathing techniques or other breathing techniques.;To learn and demonstrate proper use of respiratory medications   Long  Term Goals  Exhibits compliance with exercise, home and travel O2 prescription;Verbalizes importance of monitoring SPO2 with pulse oximeter and return demonstration;Maintenance of O2 saturations>88%;Exhibits proper breathing techniques, such as pursed lip breathing or other method taught during program session;Compliance with respiratory medication;Demonstrates proper use of MDI's  Exhibits compliance with exercise, home  and travel O2 prescription;Verbalizes importance of monitoring SPO2 with pulse oximeter and return demonstration;Maintenance of O2 saturations>88%;Exhibits proper breathing techniques, such as pursed lip breathing or other method taught during program session;Compliance with respiratory medication;Demonstrates proper use of MDI's  Exhibits compliance with exercise, home and travel O2 prescription;Verbalizes importance of monitoring SPO2 with pulse oximeter and return demonstration;Maintenance of O2 saturations>88%;Exhibits proper breathing techniques, such as pursed lip breathing or other method taught during program session;Compliance with respiratory medication;Demonstrates proper use of MDI's  Exhibits compliance with exercise, home and travel O2 prescription;Verbalizes importance of monitoring SPO2 with pulse oximeter and return demonstration;Maintenance of O2 saturations>88%;Exhibits proper breathing techniques, such as pursed lip breathing or other method taught during program session;Compliance with respiratory medication;Demonstrates proper use of MDI's  Exhibits compliance with exercise, home and travel O2 prescription;Verbalizes importance of monitoring SPO2 with pulse oximeter and return demonstration;Maintenance of O2  saturations>88%;Exhibits proper breathing techniques, such as pursed lip breathing or other method taught during program session;Compliance with respiratory medication;Demonstrates proper use of MDI's   Comments  Spoke to patient about COPD Action Plan. Reviewed and talked with the patient about the different levels of COPD severity that they should review on a daily basis and the steps they can take to manage their COPD. Went over pertinent actions for the patient can take when they feel like they are having a COPD exacerbation and the necessary treatment if needed. If patient has any changes with their breathing or feel like they are in a different zone of severity to inform staff for re-evaluation. Patient verbalizes understanding. Copy given to patient. Patient states that her roomate needs to quit smoking so she can quit smoking, She has not had a cigarette in a week. She also states that she has not had an exacerbation in over a year.  Patient states that she has been sick for three weeks and is taking prednisone. She has been wearing 3-4 liters at home for her shortness of breath. She is taking all her medications and using PLB when she is short of breath.  Christine Serrano has been doing well with her oxygen therapy. She checks her saturations routinely and they have been staying up!!  She also has been using her PLB more and more and finds it helpful with her ADLs.  She is compliant with her medications as well.   Christine Serrano continues to stay compliant with her oxygen and numbers have been looking good.  She continues to use her PLB and her breathing is doing better overall.  She has been working outside more too. She mentioned that yesterday she started having a runny nose and coughing again. She was encouraged to contact her doctor about it today!  Christine Serrano is compliant with her oxygen and reports no issues. Her SOB has improved since increasing activity slowly. Cough from previous documentation has improved.    Goals/Expected Outcomes  Short: Follow COPD action plan. Long: Report any changes and severity of COPD.  Short: use what breathing techniques and exercises patient has learned in [facility] thus far to improved shortness of breath. Long: maintain exercise independently and return to [facility] when available.  Short: Continue to use PLB regualry.  Long: Continue to stay compliant with oxygen therapy.   Short: Contact doctor about cough.  Long: Continued compliance with oxygen.   Short:continue daily medications Long: maintain independence with oxygen management      Oxygen Discharge (Final Oxygen Re-Evaluation): Oxygen Re-Evaluation - 12/02/18 1210      Program Oxygen Prescription  Program Oxygen Prescription  Continuous;E-Tanks    Liters per minute  3      Home Oxygen   Home Oxygen Device  Home Concentrator;Portable Concentrator;E-Tanks    Sleep Oxygen Prescription  Continuous    Liters per minute  3    Home Exercise Oxygen Prescription  Pulsed    Liters per minute  3    Home at Rest Exercise Oxygen Prescription  Continuous    Compliance with Home Oxygen Use  Yes      Goals/Expected Outcomes   Short Term Goals  To learn and exhibit compliance with exercise, home and travel O2 prescription;To learn and understand importance of monitoring SPO2 with pulse oximeter and demonstrate accurate use of the pulse oximeter.;To learn and understand importance of maintaining oxygen saturations>88%;To learn and demonstrate proper pursed lip breathing techniques or other breathing techniques.;To learn and demonstrate proper use of respiratory medications    Long  Term Goals  Exhibits compliance with exercise, home and travel O2 prescription;Verbalizes importance of monitoring SPO2 with pulse oximeter and return demonstration;Maintenance of O2 saturations>88%;Exhibits proper breathing techniques, such as pursed lip breathing or other method taught during program session;Compliance with respiratory  medication;Demonstrates proper use of MDI's    Comments  Christine Serrano is compliant with her oxygen and reports no issues. Her SOB has improved since increasing activity slowly. Cough from previous documentation has improved.    Goals/Expected Outcomes  Short:continue daily medications Long: maintain independence with oxygen management       Initial Exercise Prescription:   Perform Capillary Blood Glucose checks as needed.  Exercise Prescription Changes: Exercise Prescription Changes    Row Name 08/12/18 1200 08/28/18 1000           Response to Exercise   Blood Pressure (Admit)  134/72  134/60      Blood Pressure (Exercise)  122/64  -      Blood Pressure (Exit)  121/64  96/60      Heart Rate (Admit)  92 bpm  100 bpm      Heart Rate (Exercise)  117 bpm  131 bpm      Heart Rate (Exit)  86 bpm  89 bpm      Oxygen Saturation (Admit)  97 %  94 %      Oxygen Saturation (Exercise)  94 %  91 %      Oxygen Saturation (Exit)  98 %  97 %      Rating of Perceived Exertion (Exercise)  12  14      Perceived Dyspnea (Exercise)  1  4      Symptoms  none  none      Duration  Progress to 45 minutes of aerobic exercise without signs/symptoms of physical distress  Continue with 45 min of aerobic exercise without signs/symptoms of physical distress.      Intensity  THRR unchanged  THRR unchanged        Progression   Progression  Continue to progress workloads to maintain intensity without signs/symptoms of physical distress.  Continue to progress workloads to maintain intensity without signs/symptoms of physical distress.      Average METs  2.81  2.94        Resistance Training   Training Prescription  Yes  Yes      Weight  3 lb  3 lb      Reps  10-15  10-15        Interval Training   Interval Training  No  No  Treadmill   MPH  1.7  1.7      Grade  0.5  0.5      Minutes  15  15      METs  2.42  2.42        NuStep   Level  -  3      SPM  -  80      Minutes  -  15      METs  -  2.7         REL-XR   Level  1  1      Speed  50  50      Minutes  15  15      METs  3.2  2.7        Home Exercise Plan   Plans to continue exercise at  Home (comment) walking  Home (comment) walking      Frequency  Add 1 additional day to program exercise sessions.  Add 1 additional day to program exercise sessions.      Initial Home Exercises Provided  07/29/18  07/29/18         Exercise Comments:   Exercise Goals and Review:   Exercise Goals Re-Evaluation : Exercise Goals Re-Evaluation    Row Name 08/12/18 1212 08/28/18 1140 09/08/18 1329 09/23/18 1053 11/04/18 1021     Exercise Goal Re-Evaluation   Exercise Goals Review  Increase Physical Activity;Increase Strength and Stamina;Able to understand and use rate of perceived exertion (RPE) scale;Able to understand and use Dyspnea scale;Knowledge and understanding of Target Heart Rate Range (THRR);Able to check pulse independently;Understanding of Exercise Prescription  Increase Physical Activity;Increase Strength and Stamina;Able to understand and use rate of perceived exertion (RPE) scale;Able to understand and use Dyspnea scale  -  Increase Physical Activity;Increase Strength and Stamina;Understanding of Exercise Prescription  Increase Physical Activity;Increase Strength and Stamina;Understanding of Exercise Prescription   Comments  Christine Serrano has only attended two exercise sessions in February.  More consistent attendance will give better progression.  Christine Serrano has not attended since 3/2.  Out since last review  [place] is finally feeling better.  She has gotten over her cold and ulcer flare up. She is back to exercising everyday.  She takes the dogs out for walks at least twice a day. She has also been able to do more around the house as well.  She is eager to get back to classes again for the routine.  She has not been getting out emails.  Her address was verified.  She will have her girlfriend look into it again today.  Christine Serrano has been doing well at  home.  She has been getting in more walking and now also working out in the yard.  She does her weights each day.  She has not been getting our emails as her phone had died, recent the videos to her.   Expected Outcomes  Short - attend 2-3 times per week Long - improve MET level  Short - attend 2-3 times per week Long - improve MET level  -  Short: Continue to exercise daily.  Long: Continue to rebuild strength and stamina.   Short: Try out our videos.  Long: Continue to rebuild stamina.    [place] Name 12/02/18 1152             Exercise Goal Re-Evaluation   Exercise Goals Review  Increase Physical Activity;Increase Strength and Stamina;Understanding of Exercise Prescription       Comments  Christine Serrano  has been feeling great these past few weeks! She has tapered off her depression meds (per MD supervision) and has been feeling so much more active. Her horse has been keeping her busy, she's been weeding around her bushes, and canning fruits and vegetables. All these activities have boosted her spirit and therefore encouraged her to be more active       Expected Outcomes  Short: watch the videos, continue increasing activity. Long: build stamina to keep up with daily activities/chores          Discharge Exercise Prescription (Final Exercise Prescription Changes): Exercise Prescription Changes - 08/28/18 1000      Response to Exercise   Blood Pressure (Admit)  134/60    Blood Pressure (Exit)  96/60    Heart Rate (Admit)  100 bpm    Heart Rate (Exercise)  131 bpm    Heart Rate (Exit)  89 bpm    Oxygen Saturation (Admit)  94 %    Oxygen Saturation (Exercise)  91 %    Oxygen Saturation (Exit)  97 %    Rating of Perceived Exertion (Exercise)  14    Perceived Dyspnea (Exercise)  4    Symptoms  none    Duration  Continue with 45 min of aerobic exercise without signs/symptoms of physical distress.    Intensity  THRR unchanged      Progression   Progression  Continue to progress workloads to maintain  intensity without signs/symptoms of physical distress.    Average METs  2.94      Resistance Training   Training Prescription  Yes    Weight  3 lb    Reps  10-15      Interval Training   Interval Training  No      Treadmill   MPH  1.7    Grade  0.5    Minutes  15    METs  2.42      NuStep   Level  3    SPM  80    Minutes  15    METs  2.7      REL-XR   Level  1    Speed  50    Minutes  15    METs  2.7      Home Exercise Plan   Plans to continue exercise at  Home (comment)   walking   Frequency  Add 1 additional day to program exercise sessions.    Initial Home Exercises Provided  07/29/18       Nutrition:  Target Goals: Understanding of nutrition guidelines, daily intake of sodium '1500mg'$ , cholesterol '200mg'$ , calories 30% from fat and 7% or less from saturated fats, daily to have 5 or more servings of fruits and vegetables.  Biometrics:    Nutrition Therapy Plan and Nutrition Goals: Nutrition Therapy & Goals - 10/27/18 1336      Nutrition Therapy   Diet  heart healthy, low sodium diet (right now focus on increasing kcal and pro)    Protein (specify units)  40-50g    Fiber  25 grams    Whole Grain Foods  3 servings    Saturated Fats  12 max. grams    Fruits and Vegetables  5 servings/day    Sodium  1.5 grams      Personal Nutrition Goals   Nutrition Goal  ST: increase calories and protein eaten daily LT: gain appetite back and maintain weight    Comments  Appetite is good, but depressed  and anxious so will not eat. is not eating as much as usual. Pt reports lost about 10 pounds, went down pant sizes (unsure how much). has less energy and it is harder to do things.  soemtimes wont eat at all; usually  eats once a day (about a fistfull - a sandwich) - since the fall.  discussed ways to increase intake to meet calorie and protein needs, told pt to eat small frequent meals making sure to eat protein foods first, add some healthy fat to food, etc. Pt does not like  guacaomole but suggested soemthing like a bean dip/hummus, yogurt, or peanut butter to add protein and fat. Pt reports eating raw veggies and ranch as a snack.  Discussed MyPlate. Suggested she talk to her psychologist again for her depression and anxiety - they may have telehealth available.       Intervention Plan   Intervention  Prescribe, educate and counsel regarding individualized specific dietary modifications aiming towards targeted core components such as weight, hypertension, lipid management, diabetes, heart failure and other comorbidities.    Expected Outcomes  Short Term Goal: Understand basic principles of dietary content, such as calories, fat, sodium, cholesterol and nutrients.;Short Term Goal: A plan has been developed with personal nutrition goals set during dietitian appointment.;Long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

## 2019-01-27 NOTE — Progress Notes (Signed)
Daily Session Note  Patient Details  Name: Christine Serrano MRN: [id number] Date of Birth: [birth year] Referring Provider:     Pulmonary Rehab from 07/20/2018 in [facility] Cardiac and Pulmonary Rehab  Referring Provider  Louretta Parma MD      Encounter Date: 01/27/2019  Check In: Session Check In - 01/27/19 1007      Check-In   Supervising physician immediately available to respond to emergencies  See telemetry face sheet for immediately available ER MD    Location  [facility]-Cardiac & Pulmonary Rehab    Staff Present  Alberteen Sam, MA, RCEP, CCRP, CCET;Joseph [place];Heath Lark, RN, BSN, CCRP    Virtual Visit  No    Medication changes reported      No    Fall or balance concerns reported     Yes    Comments  tripped over dog last week    Tobacco Cessation  Use Increase    Warm-up and Cool-down  Performed on first and last piece of equipment    Resistance Training Performed  Yes    VAD Patient?  No    PAD/SET Patient?  No      Pain Assessment   Currently in Pain?  No/denies          Social History   Tobacco Use  Smoking Status Current Every Day Smoker  . Packs/day: 1.00  . Years: 47.00  . Pack years: 47.00  . Types: Cigarettes  . Last attempt to quit: 07/25/2018  . Years since quitting: 0.5  Smokeless Tobacco Never Used  Tobacco Comment   09/23/18 Still at 1/2 ppd and out of patches but plans to get more    Goals Met:  Exercise tolerated well No report of cardiac concerns or symptoms Strength training completed today  Goals Unmet:  Not Applicable  Comments: Pt able to follow exercise prescription today without complaint.  Will continue to monitor for progression.    Dr. Emily Filbert is Medical Director for [place] and [facility].

## 2019-01-28 ENCOUNTER — Telehealth: Payer: Self-pay

## 2019-01-28 NOTE — Telephone Encounter (Signed)
Pt left v/m that pharmacy had not gotten response to refills on proair and benzonatate.Please advise.

## 2019-01-29 ENCOUNTER — Other Ambulatory Visit: Payer: Self-pay

## 2019-01-29 ENCOUNTER — Encounter: Payer: Medicare Other | Admitting: *Deleted

## 2019-01-29 DIAGNOSIS — J9611 Chronic respiratory failure with hypoxia: Secondary | ICD-10-CM | POA: Diagnosis not present

## 2019-01-29 DIAGNOSIS — J449 Chronic obstructive pulmonary disease, unspecified: Secondary | ICD-10-CM | POA: Diagnosis not present

## 2019-01-29 MED ORDER — BENZONATATE 100 MG PO CAPS: 100.0000 mg | ORAL_CAPSULE | Freq: Two times a day (BID) | ORAL | 1 refills | Status: DC | PRN

## 2019-01-29 MED ORDER — PROAIR HFA 108 (90 BASE) MCG/ACT IN AERS: INHALATION_SPRAY | RESPIRATORY_TRACT | 1 refills | Status: DC

## 2019-01-29 NOTE — Progress Notes (Signed)
Daily Session Note  Patient Details  Name: Christine Serrano MRN: [id number] Date of Birth: [birth year] Referring Provider:     Pulmonary Rehab from 07/20/2018 in [facility] Cardiac and Pulmonary Rehab  Referring Provider  Louretta Parma MD      Encounter Date: 01/29/2019  Check In: Session Check In - 01/29/19 0959      Check-In   Supervising physician immediately available to respond to emergencies  See telemetry face sheet for immediately available ER MD    Location  [facility]-Cardiac & Pulmonary Rehab    Staff Present  Renita Papa, RN Vickki Hearing, BA, ACSM CEP, Exercise Physiologist;Joseph Tessie Fass RCP,RRT,BSRT    Virtual Visit  No    Medication changes reported      No    Warm-up and Cool-down  Performed on first and last piece of equipment    Resistance Training Performed  Yes    VAD Patient?  No    PAD/SET Patient?  No      Pain Assessment   Currently in Pain?  No/denies          Social History   Tobacco Use  Smoking Status Current Every Day Smoker  . Packs/day: 1.00  . Years: 47.00  . Pack years: 47.00  . Types: Cigarettes  . Last attempt to quit: 07/25/2018  . Years since quitting: 0.5  Smokeless Tobacco Never Used  Tobacco Comment   09/23/18 Still at 1/2 ppd and out of patches but plans to get more    Goals Met:  Independence with exercise equipment Exercise tolerated well No report of cardiac concerns or symptoms Strength training completed today  Goals Unmet:  Not Applicable  Comments: Pt able to follow exercise prescription today without complaint.  Will continue to monitor for progression.    Dr. Emily Filbert is Medical Director for [place] and [facility].

## 2019-01-29 NOTE — Telephone Encounter (Signed)
E-scribed refills.  Spoke with pt notifying her refills sent.

## 2019-02-03 ENCOUNTER — Other Ambulatory Visit: Payer: Self-pay

## 2019-02-03 ENCOUNTER — Encounter: Payer: Medicare Other | Admitting: *Deleted

## 2019-02-03 ENCOUNTER — Encounter: Payer: Self-pay | Admitting: *Deleted

## 2019-02-03 DIAGNOSIS — J449 Chronic obstructive pulmonary disease, unspecified: Secondary | ICD-10-CM | POA: Diagnosis not present

## 2019-02-03 DIAGNOSIS — J9611 Chronic respiratory failure with hypoxia: Secondary | ICD-10-CM | POA: Diagnosis not present

## 2019-02-03 NOTE — Progress Notes (Signed)
Daily Session Note  Patient Details  Name: ASAIAH HUNNICUTT MRN: [id number] Date of Birth: 02-18-1961 Referring Provider:     Pulmonary Rehab from 07/20/2018 in [facility] Cardiac and Pulmonary Rehab  Referring Provider  Louretta Parma MD      Encounter Date: 02/03/2019  Check In: Session Check In - 02/03/19 1001      Check-In   Supervising physician immediately available to respond to emergencies  See telemetry face sheet for immediately available ER MD    Location  [facility]-Cardiac & Pulmonary Rehab    Staff Present  Alberteen Sam, MA, RCEP, CCRP, CCET;Jeanna Durrell BS, Exercise Physiologist;Krista Frederico Hamman, RN BSN    Virtual Visit  No    Medication changes reported      No    Tobacco Cessation  Use Decreased   down to 1/2 ppd   Warm-up and Cool-down  Performed on first and last piece of equipment    Resistance Training Performed  Yes    VAD Patient?  No    PAD/SET Patient?  No      Pain Assessment   Currently in Pain?  No/denies          Social History   Tobacco Use  Smoking Status Current Every Day Smoker  . Packs/day: 0.50  . Years: 47.00  . Pack years: 23.50  . Types: Cigarettes  . Last attempt to quit: 07/25/2018  . Years since quitting: 0.5  Smokeless Tobacco Never Used  Tobacco Comment   02/03/19 thinking about quitting more and will give up smoking in house    Goals Met:  Independence with exercise equipment Exercise tolerated well No report of cardiac concerns or symptoms Strength training completed today  Goals Unmet:  Not Applicable  Comments: Pt able to follow exercise prescription today without complaint.  Will continue to monitor for progression.    Dr. Emily Filbert is Medical Director for [place] and [facility].

## 2019-02-10 ENCOUNTER — Other Ambulatory Visit: Payer: Self-pay | Admitting: Family Medicine

## 2019-02-10 DIAGNOSIS — M810 Age-related osteoporosis without current pathological fracture: Secondary | ICD-10-CM

## 2019-02-10 DIAGNOSIS — E785 Hyperlipidemia, unspecified: Secondary | ICD-10-CM

## 2019-02-11 ENCOUNTER — Telehealth: Payer: Self-pay

## 2019-02-11 ENCOUNTER — Ambulatory Visit: Payer: Self-pay

## 2019-02-11 ENCOUNTER — Other Ambulatory Visit (INDEPENDENT_AMBULATORY_CARE_PROVIDER_SITE_OTHER): Payer: Medicare Other

## 2019-02-11 DIAGNOSIS — M810 Age-related osteoporosis without current pathological fracture: Secondary | ICD-10-CM | POA: Diagnosis not present

## 2019-02-11 DIAGNOSIS — E785 Hyperlipidemia, unspecified: Secondary | ICD-10-CM | POA: Diagnosis not present

## 2019-02-11 LAB — COMPREHENSIVE METABOLIC PANEL
ALT: 9 U/L (ref 0–35)
AST: 18 U/L (ref 0–37)
Albumin: 4.1 g/dL (ref 3.5–5.2)
Alkaline Phosphatase: 73 U/L (ref 39–117)
BUN: 11 mg/dL (ref 6–23)
CO2: 32 mEq/L (ref 19–32)
Calcium: 9.4 mg/dL (ref 8.4–10.5)
Chloride: 102 mEq/L (ref 96–112)
Creatinine, Ser: 0.65 mg/dL (ref 0.40–1.20)
GFR: 92.94 mL/min (ref >60.00)
Glucose, Bld: 102 mg/dL — ABNORMAL HIGH (ref 70–99)
Potassium: 3.8 mEq/L (ref 3.5–5.1)
Sodium: 140 mEq/L (ref 135–145)
Total Bilirubin: 0.4 mg/dL (ref 0.2–1.2)
Total Protein: 6.5 g/dL (ref 6.0–8.3)

## 2019-02-11 LAB — LIPID PANEL
Cholesterol: 207 mg/dL — ABNORMAL HIGH (ref 0–200)
HDL: 49.4 mg/dL (ref >39.00)
LDL Cholesterol: 133 mg/dL — ABNORMAL HIGH (ref 0–99)
NonHDL: 157.81
Total CHOL/HDL Ratio: 4
Triglycerides: 122 mg/dL (ref 0.0–149.0)
VLDL: 24.4 mg/dL (ref 0.0–40.0)

## 2019-02-11 LAB — VITAMIN D 25 HYDROXY (VIT D DEFICIENCY, FRACTURES): VITD: >120 ng/mL

## 2019-02-11 NOTE — Telephone Encounter (Addendum)
I called patient and asked her to stop taking any extra vitamin D.  Her calcium is still wnl.  I'll defer to PCP o/w.  Note routed to PCP.  Med list updated.

## 2019-02-11 NOTE — Telephone Encounter (Signed)
[facility] resulted a critical @1620  Vit D greater than 120  I will verbally tell Dr Damita Dunnings as Dr Danise Mina is off

## 2019-02-11 NOTE — Addendum Note (Signed)
Addended by: Tonia Ghent on: 02/11/2019 05:17 PM   Modules accepted: Orders

## 2019-02-12 NOTE — Telephone Encounter (Addendum)
Noted. Agree. Will eval at OV. Was on 2000 IU daily.

## 2019-02-14 ENCOUNTER — Other Ambulatory Visit: Payer: Self-pay | Admitting: Family Medicine

## 2019-02-15 NOTE — Telephone Encounter (Signed)
Last filled on 12/25/2018 for #60 with 1 refill, LOV 12/04/2018 for acute visit, future appointment on 02/25/2019.

## 2019-02-17 ENCOUNTER — Encounter: Payer: Medicare Other | Attending: Pulmonary Disease

## 2019-02-17 DIAGNOSIS — J449 Chronic obstructive pulmonary disease, unspecified: Secondary | ICD-10-CM | POA: Insufficient documentation

## 2019-02-17 DIAGNOSIS — J9611 Chronic respiratory failure with hypoxia: Secondary | ICD-10-CM | POA: Insufficient documentation

## 2019-02-20 DIAGNOSIS — J449 Chronic obstructive pulmonary disease, unspecified: Secondary | ICD-10-CM | POA: Diagnosis not present

## 2019-02-23 NOTE — Progress Notes (Signed)
Unable to complete nutrition 30-day re-eval cycle due to inconsistent attendence

## 2019-02-24 ENCOUNTER — Encounter: Payer: Self-pay | Admitting: *Deleted

## 2019-02-24 ENCOUNTER — Encounter: Payer: Medicare Other | Admitting: *Deleted

## 2019-02-24 ENCOUNTER — Other Ambulatory Visit: Payer: Self-pay

## 2019-02-24 DIAGNOSIS — J449 Chronic obstructive pulmonary disease, unspecified: Secondary | ICD-10-CM

## 2019-02-24 DIAGNOSIS — J9611 Chronic respiratory failure with hypoxia: Secondary | ICD-10-CM | POA: Diagnosis not present

## 2019-02-24 NOTE — Progress Notes (Signed)
Daily Session Note  Patient Details  Name: Christine Serrano MRN: [id number] Date of Birth: 1960/07/31 Referring Provider:     Pulmonary Rehab from 07/20/2018 in [facility]  Referring Provider  Louretta Parma MD      Encounter Date: 02/24/2019  Check In: Session Check In - 02/24/19 1002      Check-In   Supervising physician immediately available to respond to emergencies  See telemetry face sheet for immediately available ER MD    Location  [facility]    Staff Present  Heath Lark, RN, BSN, CCRP;Joseph Hood RCP,RRT,BSRT;Jessica [place], [place], [place], [place], CCET    Virtual Visit  No    Medication changes reported      No    Fall or balance concerns reported     No    Warm-up and Cool-down  Performed on first and last piece of equipment    Resistance Training Performed  Yes    VAD Patient?  No    PAD/SET Patient?  No      Pain Assessment   Currently in Pain?  No/denies          Social History   Tobacco Use  Smoking Status Current Every Day Smoker  . Packs/day: 0.50  . Years: 47.00  . Pack years: 23.50  . Types: Cigarettes  . Last attempt to quit: 07/25/2018  . Years since quitting: 0.5  Smokeless Tobacco Never Used  Tobacco Comment   02/03/19 thinking about quitting more and will give up smoking in house    Goals Met:  Independence with exercise equipment Personal goals reviewed No report of cardiac concerns or symptoms Strength training completed today  Goals Unmet:  Not Applicable  Comments: Pt able to follow exercise prescription today without complaint.  Will continue to monitor for progression.  Smoking 3/4 pack/day increase since last reading  Dr. Emily Filbert is Medical Director for [facility] and [facility].

## 2019-02-24 NOTE — Progress Notes (Signed)
Pulmonary Individual Treatment Plan  Patient Details  Name: Christine Serrano MRN: [id number] Date of Birth: 03-Aug-1960 Referring Provider:     Pulmonary Rehab from 07/20/2018 in [facility] Cardiac and Pulmonary Rehab  Referring Provider  Louretta Parma MD      Initial Encounter Date:    Pulmonary Rehab from 07/20/2018 in [facility] Cardiac and Pulmonary Rehab  Date  07/20/18      Visit Diagnosis: COPD, severe ([place])  Patient's Home Medications on Admission:  Current Outpatient Medications:  .  aspirin EC 81 MG tablet, Take 81 mg by mouth daily., Disp: , Rfl:  .  benzonatate (TESSALON) 100 MG capsule, Take 1 capsule (100 mg total) by mouth 2 (two) times daily as needed for cough., Disp: 30 capsule, Rfl: 1 .  buPROPion (WELLBUTRIN SR) 100 MG 12 hr tablet, TAKE 1 TABLET BY MOUTH EVERY DAY, Disp: 90 tablet, Rfl: 0 .  clotrimazole (LOTRIMIN) 1 % cream, Apply 1 application topically 2 (two) times daily. For 2 weeks to rash, Disp: 60 g, Rfl: 0 .  fluticasone (FLONASE) 50 MCG/ACT nasal spray, Place 2 sprays into both nostrils daily., Disp: 48 g, Rfl: 3 .  gabapentin (NEURONTIN) 100 MG capsule, Take 1 capsule (100 mg total) by mouth 2 (two) times daily., Disp: 180 capsule, Rfl: 3 .  hydrOXYzine (ATARAX/VISTARIL) 25 MG tablet, TAKE 1 TABLET (25 MG TOTAL) BY MOUTH 2 (TWO) TIMES DAILY AS NEEDED FOR ANXIETY., Disp: 180 tablet, Rfl: 0 .  ipratropium-albuterol (DUONEB) 0.5-2.5 (3) MG/3ML SOLN, Inhale 3 mLs into the lungs every 6 (six) hours as needed., Disp: 360 mL, Rfl: 3 .  LINZESS 290 MCG CAPS capsule, Take 1 capsule (290 mcg total) by mouth daily as needed (constipation)., Disp: 30 capsule, Rfl: 3 .  Multiple Vitamins-Minerals (MULTIVITAMIN ADULT) TABS, Take 1 tablet by mouth daily., Disp: , Rfl:  .  naproxen (NAPROSYN) 375 MG tablet, Take 1 tablet (375 mg total) by mouth 2 (two) times daily as needed for moderate pain., Disp: 40 tablet, Rfl: 1 .  nicotine (NICODERM CQ) 7 mg/24hr patch, Place 1 patch (7 mg  total) onto the skin daily., Disp: 28 patch, Rfl: 3 .  omeprazole (PRILOSEC) 40 MG capsule, TAKE 1 CAPSULE BY MOUTH DAILY. FOR THREE WEEKS THEN AS NEEDED. TAKE 30 MIN PRIOR TO LARGE MEAL, Disp: 90 capsule, Rfl: 1 .  OXYGEN, Inhale 3 L/min into the lungs continuous., Disp: , Rfl:  .  polyethylene glycol powder (GLYCOLAX/MIRALAX) powder, Take 17 g by mouth daily as needed for moderate constipation., Disp: 3350 g, Rfl: 1 .  PROAIR HFA 108 (90 Base) MCG/ACT inhaler, TAKE 2 PUFFS BY MOUTH EVERY 6 HOURS AS NEEDED FOR WHEEZE OR SHORTNESS OF BREATH, Disp: 54 g, Rfl: 1 .  SYMBICORT 160-4.5 MCG/ACT inhaler, Inhale 2 puffs into the lungs 2 (two) times daily., Disp: 3 Inhaler, Rfl: 3 .  tiotropium (SPIRIVA) 18 MCG inhalation capsule, Place 1 capsule (18 mcg total) into inhaler and inhale daily., Disp: 90 capsule, Rfl: 4 .  tiZANidine (ZANAFLEX) 2 MG tablet, TAKE 1 TABLET (2 MG TOTAL) BY MOUTH 2 (TWO) TIMES DAILY AS NEEDED FOR MUSCLE SPASMS (NECK PAIN)., Disp: 60 tablet, Rfl: 1 .  traZODone (DESYREL) 50 MG tablet, Take 1 tablet (50 mg total) by mouth at bedtime., Disp: 90 tablet, Rfl: 3 .  triamcinolone cream (KENALOG) 0.1 %, APPLY TO AFFECTED AREA TWICE A DAY, Disp: 45 g, Rfl: 0 .  vitamin B-12 (CYANOCOBALAMIN) 1000 MCG tablet, Take 1,000 mcg by mouth daily., Disp: ,  Rfl:  .  vitamin E 400 UNIT capsule, Take 400 Units by mouth daily. , Disp: , Rfl:   Past Medical History: Past Medical History:  Diagnosis Date  . Cervical cancer ([place])   . Chronic idiopathic constipation   . COPD (chronic obstructive pulmonary disease) ([place])   . Depression   . Endometriosis   . HLD (hyperlipidemia)     Tobacco Use: Social History   Tobacco Use  Smoking Status Current Every Day Smoker  . Packs/day: 0.50  . Years: 47.00  . Pack years: 23.50  . Types: Cigarettes  . Last attempt to quit: 07/25/2018  . Years since quitting: 0.5  Smokeless Tobacco Never Used  Tobacco Comment   02/03/19 thinking about quitting more and  will give up smoking in house    Labs: Recent Review Flowsheet Data    Labs for ITP Cardiac and Pulmonary Rehab Latest Ref Rng & Units 04/04/2009 04/09/2016 02/05/2017 02/06/2018 02/11/2019   Cholestrol 0 - 200 mg/dL - - 207(H) 181 207(H)   LDLCALC 0 - 99 mg/dL - - 137(H) 105(H) 133(H)   HDL >39.00 mg/dL - - 43.60 45.30 49.40   Trlycerides 0.0 - 149.0 mg/dL - - 133.0 153.0(H) 122.0   PHART 7.350 - 7.450 7.302(L) 7.30(L) - - -   PCO2ART 32.0 - 48.0 mmHg 59.5 CRITICAL RESULT CALLED TO, READ BACK BY AND VERIFIED WITH: [facility] 04/04/09 AT 1440 CRITICAL RESULT CALLED TO, READ BACK BY AND VERIFIED WITH: [facility] 04/04/09 AT 1440 BY BROADNAX,L.RRT(HH) 65(H) - - -   HCO3 20.0 - 28.0 mmol/L 28.5(H) 32.0(H) - - -   TCO2 0 - 100 mmol/L 25.9 - - - -   O2SAT % 95.5 88.7 - - -       Pulmonary Assessment Scores:   UCSD: Self-administered rating of dyspnea associated with activities of daily living (ADLs) 6-point scale (0 = "not at all" to 5 = "maximal or unable to do because of breathlessness")  Scoring Scores range from 0 to 120.  Minimally important difference is 5 units  CAT: CAT can identify the health impairment of COPD patients and is better correlated with disease progression.  CAT has a scoring range of zero to 40. The CAT score is classified into four groups of low (less than 10), medium (10 - 20), high (21-30) and very high (31-40) based on the impact level of disease on health status. A CAT score over 10 suggests significant symptoms.  A worsening CAT score could be explained by an exacerbation, poor medication adherence, poor inhaler technique, or progression of COPD or comorbid conditions.  CAT MCID is 2 points  mMRC: mMRC (Modified Medical Research Council) Dyspnea Scale is used to assess the degree of baseline functional disability in patients of respiratory disease due to dyspnea. No minimal important difference is established. A decrease in score of 1 point or greater is  considered a positive change.   Pulmonary Function Assessment:   Exercise Target Goals: Exercise Program Goal: Individual exercise prescription set using results from initial 6 min walk test and THRR while considering  patient's activity barriers and safety.   Exercise Prescription Goal: Initial exercise prescription builds to 30-45 minutes a day of aerobic activity, 2-3 days per week.  Home exercise guidelines will be given to patient during program as part of exercise prescription that the participant will acknowledge.  Activity Barriers & Risk Stratification:   6 Minute Walk:  Oxygen Initial Assessment:   Oxygen Re-Evaluation: Oxygen Re-Evaluation    Row  Name 09/03/18 1339 09/23/18 1109 11/04/18 1029 12/02/18 1210       Program Oxygen Prescription   Program Oxygen Prescription  Continuous;E-Tanks  Continuous;E-Tanks  Continuous;E-Tanks  Continuous;E-Tanks    Liters per minute  _0 Comments  patient sometimes uses 4 liters of oxygen.  patient sometimes uses 4 liters of oxygen when walking   -  -      Home Oxygen   Home Oxygen Device  Home Concentrator;Portable Concentrator;E-Tanks  Home Concentrator;Portable Concentrator;E-Tanks  Home Concentrator;Portable Concentrator;E-Tanks  Home Concentrator;Portable Concentrator;E-Tanks    Sleep Oxygen Prescription  Continuous  Continuous  Continuous  Continuous    Liters per minute  3  3  -  3    Home Exercise Oxygen Prescription  Pulsed  Pulsed  Pulsed  Pulsed    Liters per minute  -  _1 Home at Rest Exercise Oxygen Prescription  Continuous  Continuous  Continuous  Continuous    Liters per minute  3  -  3  -    Compliance with Home Oxygen Use  Yes  Yes  Yes  Yes      Goals/Expected Outcomes   Short Term Goals  To learn and exhibit compliance with exercise, home and travel O2 prescription;To learn and understand importance of monitoring SPO2 with pulse oximeter and demonstrate accurate use of the pulse oximeter.;To  learn and understand importance of maintaining oxygen saturations>88%;To learn and demonstrate proper pursed lip breathing techniques or other breathing techniques.;To learn and demonstrate proper use of respiratory medications  To learn and exhibit compliance with exercise, home and travel O2 prescription;To learn and understand importance of monitoring SPO2 with pulse oximeter and demonstrate accurate use of the pulse oximeter.;To learn and understand importance of maintaining oxygen saturations>88%;To learn and demonstrate proper pursed lip breathing techniques or other breathing techniques.;To learn and demonstrate proper use of respiratory medications  To learn and exhibit compliance with exercise, home and travel O2 prescription;To learn and understand importance of monitoring SPO2 with pulse oximeter and demonstrate accurate use of the pulse oximeter.;To learn and understand importance of maintaining oxygen saturations>88%;To learn and demonstrate proper pursed lip breathing techniques or other breathing techniques.;To learn and demonstrate proper use of respiratory medications  To learn and exhibit compliance with exercise, home and travel O2 prescription;To learn and understand importance of monitoring SPO2 with pulse oximeter and demonstrate accurate use of the pulse oximeter.;To learn and understand importance of maintaining oxygen saturations>88%;To learn and demonstrate proper pursed lip breathing techniques or other breathing techniques.;To learn and demonstrate proper use of respiratory medications    Long  Term Goals  Exhibits compliance with exercise, home and travel O2 prescription;Verbalizes importance of monitoring SPO2 with pulse oximeter and return demonstration;Maintenance of O2 saturations>88%;Exhibits proper breathing techniques, such as pursed lip breathing or other method taught during program session;Compliance with respiratory medication;Demonstrates proper use of MDI's  Exhibits  compliance with exercise, home and travel O2 prescription;Verbalizes importance of monitoring SPO2 with pulse oximeter and return demonstration;Maintenance of O2 saturations>88%;Exhibits proper breathing techniques, such as pursed lip breathing or other method taught during program session;Compliance with respiratory medication;Demonstrates proper use of MDI's  Exhibits compliance with exercise, home and travel O2 prescription;Verbalizes importance of monitoring SPO2 with pulse oximeter and return demonstration;Maintenance of O2 saturations>88%;Exhibits proper breathing techniques, such as pursed lip breathing or other method taught during program session;Compliance with respiratory medication;Demonstrates proper use of MDI's  Exhibits compliance with  exercise, home and travel O2 prescription;Verbalizes importance of monitoring SPO2 with pulse oximeter and return demonstration;Maintenance of O2 saturations>88%;Exhibits proper breathing techniques, such as pursed lip breathing or other method taught during program session;Compliance with respiratory medication;Demonstrates proper use of MDI's    Comments  Patient states that she has been sick for three weeks and is taking prednisone. She has been wearing 3-4 liters at home for her shortness of breath. She is taking all her medications and using PLB when she is short of breath.  Christine Serrano has been doing well with her oxygen therapy. She checks her saturations routinely and they have been staying up!!  She also has been using her PLB more and more and finds it helpful with her ADLs.  She is compliant with her medications as well.   Christine Serrano continues to stay compliant with her oxygen and numbers have been looking good.  She continues to use her PLB and her breathing is doing better overall.  She has been working outside more too. She mentioned that yesterday she started having a runny nose and coughing again. She was encouraged to contact her doctor about it today!  Christine Serrano  is compliant with her oxygen and reports no issues. Her SOB has improved since increasing activity slowly. Cough from previous documentation has improved.    Goals/Expected Outcomes  Short: use what breathing techniques and exercises patient has learned in [facility] thus far to improved shortness of breath. Long: maintain exercise independently and return to [facility] when available.  Short: Continue to use PLB regualry.  Long: Continue to stay compliant with oxygen therapy.   Short: Contact doctor about cough.  Long: Continued compliance with oxygen.   Short:continue daily medications Long: maintain independence with oxygen management       Oxygen Discharge (Final Oxygen Re-Evaluation): Oxygen Re-Evaluation - 12/02/18 1210      Program Oxygen Prescription   Program Oxygen Prescription  Continuous;E-Tanks    Liters per minute  3      Home Oxygen   Home Oxygen Device  Home Concentrator;Portable Concentrator;E-Tanks    Sleep Oxygen Prescription  Continuous    Liters per minute  3    Home Exercise Oxygen Prescription  Pulsed    Liters per minute  3    Home at Rest Exercise Oxygen Prescription  Continuous    Compliance with Home Oxygen Use  Yes      Goals/Expected Outcomes   Short Term Goals  To learn and exhibit compliance with exercise, home and travel O2 prescription;To learn and understand importance of monitoring SPO2 with pulse oximeter and demonstrate accurate use of the pulse oximeter.;To learn and understand importance of maintaining oxygen saturations>88%;To learn and demonstrate proper pursed lip breathing techniques or other breathing techniques.;To learn and demonstrate proper use of respiratory medications    Long  Term Goals  Exhibits compliance with exercise, home and travel O2 prescription;Verbalizes importance of monitoring SPO2 with pulse oximeter and return demonstration;Maintenance of O2 saturations>88%;Exhibits proper breathing techniques, such as pursed lip breathing or  other method taught during program session;Compliance with respiratory medication;Demonstrates proper use of MDI's    Comments  Christine Serrano is compliant with her oxygen and reports no issues. Her SOB has improved since increasing activity slowly. Cough from previous documentation has improved.    Goals/Expected Outcomes  Short:continue daily medications Long: maintain independence with oxygen management       Initial Exercise Prescription:   Perform Capillary Blood Glucose checks as needed.  Exercise Prescription Changes: Exercise Prescription Changes  [place] Name 08/28/18 1000 02/05/19 1000           Response to Exercise   Blood Pressure (Admit)  134/60  116/70      Blood Pressure (Exercise)  -  124/68      Blood Pressure (Exit)  96/60  130/64      Heart Rate (Admit)  100 bpm  110 bpm      Heart Rate (Exercise)  131 bpm  140 bpm      Heart Rate (Exit)  89 bpm  135 bpm      Oxygen Saturation (Admit)  94 %  98 %      Oxygen Saturation (Exercise)  91 %  97 %      Oxygen Saturation (Exit)  97 %  97 %      Rating of Perceived Exertion (Exercise)  14  17      Perceived Dyspnea (Exercise)  4  4      Symptoms  none  -      Duration  Continue with 45 min of aerobic exercise without signs/symptoms of physical distress.  Continue with 30 min of aerobic exercise without signs/symptoms of physical distress.      Intensity  THRR unchanged  THRR unchanged        Progression   Progression  Continue to progress workloads to maintain intensity without signs/symptoms of physical distress.  Continue to progress workloads to maintain intensity without signs/symptoms of physical distress.      Average METs  2.94  3.3        Resistance Training   Training Prescription  Yes  Yes      Weight  3 lb  3 lb      Reps  10-15  10-15        Interval Training   Interval Training  No  No        Treadmill   MPH  1.7  -      Grade  0.5  -      Minutes  15  -      METs  2.42  -        NuStep   Level  3  1       SPM  80  80      Minutes  15  15      METs  2.7  2.4        REL-XR   Level  1  1      Speed  50  50      Minutes  15  15      METs  2.7  4.4        Home Exercise Plan   Plans to continue exercise at  Home (comment) walking  Home (comment) walking      Frequency  Add 1 additional day to program exercise sessions.  Add 1 additional day to program exercise sessions.      Initial Home Exercises Provided  07/29/18  07/29/18         Exercise Comments:   Exercise Goals and Review:   Exercise Goals Re-Evaluation : Exercise Goals Re-Evaluation    Row Name 08/28/18 1140 09/08/18 1329 09/23/18 1053 11/04/18 1021 12/02/18 1152     Exercise Goal Re-Evaluation   Exercise Goals Review  Increase Physical Activity;Increase Strength and Stamina;Able to understand and use rate of perceived exertion (RPE) scale;Able to understand and use Dyspnea scale  -  Increase Physical Activity;Increase Strength  and Stamina;Understanding of Exercise Prescription  Increase Physical Activity;Increase Strength and Stamina;Understanding of Exercise Prescription  Increase Physical Activity;Increase Strength and Stamina;Understanding of Exercise Prescription   Comments  Christine Serrano has not attended since 3/2.  Out since last review  Christine Serrano is finally feeling better.  She has gotten over her cold and ulcer flare up. She is back to exercising everyday.  She takes the dogs out for walks at least twice a day. She has also been able to do more around the house as well.  She is eager to get back to classes again for the routine.  She has not been getting out emails.  Her address was verified.  She will have her girlfriend look into it again today.  Christine Serrano has been doing well at home.  She has been getting in more walking and now also working out in the yard.  She does her weights each day.  She has not been getting our emails as her phone had died, recent the videos to her.  Christine Serrano has been feeling great these past few weeks! She has  tapered off her depression meds (per MD supervision) and has been feeling so much more active. Her horse has been keeping her busy, she's been weeding around her bushes, and canning fruits and vegetables. All these activities have boosted her spirit and therefore encouraged her to be more active   Expected Outcomes  Short - attend 2-3 times per week Long - improve MET level  -  Short: Continue to exercise daily.  Long: Continue to rebuild strength and stamina.   Short: Try out our videos.  Long: Continue to rebuild stamina.   Short: watch the videos, continue increasing activity. Long: build stamina to keep up with daily activities/chores   Row Name 02/05/19 1018             Exercise Goal Re-Evaluation   Exercise Goals Review  Increase Physical Activity;Increase Strength and Stamina;Able to understand and use rate of perceived exertion (RPE) scale;Able to understand and use Dyspnea scale;Knowledge and understanding of Target Heart Rate Range (THRR);Able to check pulse independently;Understanding of Exercise Prescription       Comments  Christine Serrano is just returning to in person LW classes.  She reports RPE of 14-17.  Regular attendance will improver her stamina.       Expected Outcomes  Short - attend consistently Long - increase overall stamina and MET level          Discharge Exercise Prescription (Final Exercise Prescription Changes): Exercise Prescription Changes - 02/05/19 1000      Response to Exercise   Blood Pressure (Admit)  116/70    Blood Pressure (Exercise)  124/68    Blood Pressure (Exit)  130/64    Heart Rate (Admit)  110 bpm    Heart Rate (Exercise)  140 bpm    Heart Rate (Exit)  135 bpm    Oxygen Saturation (Admit)  98 %    Oxygen Saturation (Exercise)  97 %    Oxygen Saturation (Exit)  97 %    Rating of Perceived Exertion (Exercise)  17    Perceived Dyspnea (Exercise)  4    Duration  Continue with 30 min of aerobic exercise without signs/symptoms of physical distress.     Intensity  THRR unchanged      Progression   Progression  Continue to progress workloads to maintain intensity without signs/symptoms of physical distress.    Average METs  3.3  Resistance Training   Training Prescription  Yes    Weight  3 lb    Reps  10-15      Interval Training   Interval Training  No      NuStep   Level  1    SPM  80    Minutes  15    METs  2.4      REL-XR   Level  1    Speed  50    Minutes  15    METs  4.4      Home Exercise Plan   Plans to continue exercise at  Home (comment)   walking   Frequency  Add 1 additional day to program exercise sessions.    Initial Home Exercises Provided  07/29/18       Nutrition:  Target Goals: Understanding of nutrition guidelines, daily intake of sodium <1520m, cholesterol <2063m calories 30% from fat and 7% or less from saturated fats, daily to have 5 or more servings of fruits and vegetables.  Biometrics:    Nutrition Therapy Plan and Nutrition Goals: Nutrition Therapy & Goals - 10/27/18 1336      Nutrition Therapy   Diet  heart healthy, low sodium diet (right now focus on increasing kcal and pro)    Protein (specify units)  40-50g    Fiber  25 grams    Whole Grain Foods  3 servings    Saturated Fats  12 max. grams    Fruits and Vegetables  5 servings/day    Sodium  1.5 grams      Personal Nutrition Goals   Nutrition Goal  ST: increase calories and protein eaten daily LT: gain appetite back and maintain weight    Comments  Appetite is good, but depressed and anxious so will not eat. is not eating as much as usual. Pt reports lost about 10 pounds, went down pant sizes (unsure how much). has less energy and it is harder to do things.  soemtimes wont eat at all; usually  eats once a day (about a fistfull - a sandwich) - since the fall.  discussed ways to increase intake to meet calorie and protein needs, told pt to eat small frequent meals making sure to eat protein foods first, add some healthy fat to  food, etc. Pt does not like guacaomole but suggested soemthing like a bean dip/hummus, yogurt, or peanut butter to add protein and fat. Pt reports eating raw veggies and ranch as a snack.  Discussed MyPlate. Suggested she talk to her psychologist again for her depression and anxiety - they may have telehealth available.       Intervention Plan   Intervention  Prescribe, educate and counsel regarding individualized specific dietary modifications aiming towards targeted core components such as weight, hypertension, lipid management, diabetes, heart failure and other comorbidities.    Expected Outcomes  Short Term Goal: Understand basic principles of dietary content, such as calories, fat, sodium, cholesterol and nutrients.;Short Term Goal: A plan has been developed with personal nutrition goals set during dietitian appointment.;Long Term Goal: Adherence to prescribed nutrition plan.       Nutrition Assessments:   Nutrition Goals Re-Evaluation:   Nutrition Goals Discharge (Final Nutrition Goals Re-Evaluation):   Psychosocial: Target Goals: Acknowledge presence or absence of significant depression and/or stress, maximize coping skills, provide positive support system. Participant is able to verbalize types and ability to use techniques and skills needed for reducing stress and depression.   Initial Review & Psychosocial  Screening:   Quality of Life Scores:  Scores of 19 and below usually indicate a poorer quality of life in these areas.  A difference of  2-3 points is a clinically meaningful difference.  A difference of 2-3 points in the total score of the Quality of Life Index has been associated with significant improvement in overall quality of life, self-image, physical symptoms, and general health in studies assessing change in quality of life.  PHQ-9: Recent Review Flowsheet Data    Depression screen [facility] 2/9 08/13/2018 07/20/2018 03/09/2018 02/06/2018 08/18/2017   Decreased Interest _0 Down, Depressed, Hopeless _1 0   PHQ - 2 Score _2 Altered sleeping _3 Tired, decreased energy _4 Change in appetite _5 0   Feeling bad or failure about yourself  _6 0   Trouble concentrating _7 0 1   Moving slowly or fidgety/restless 0 0 1 0 0   Suicidal thoughts 0 0 0 0 0   PHQ-9 Score _8 Difficult doing work/chores - Very difficult Extremely dIfficult Very difficult -     Interpretation of Total Score  Total Score Depression Severity:  1-4 = Minimal depression, 5-9 = Mild depression, 10-14 = Moderate depression, 15-19 = Moderately severe depression, 20-27 = Severe depression   Psychosocial Evaluation and Intervention:   Psychosocial Re-Evaluation: Psychosocial Re-Evaluation    [place] Name 09/23/18 1059 11/04/18 1022 12/02/18 1156         Psychosocial Re-Evaluation   Current issues with  Current Depression;Current Stress Concerns  Current Depression;Current Stress Concerns  Current Depression;Current Stress Concerns     Comments  Christine Serrano is doing better. The Wellbutrin is helping her depression and cutting back on her smoking.  She is enjoying getting outside and sitting on porch to look at all flowers in their yard.  She is also excited to go to visit her horses again as they bring her absolute joy.     Christine Serrano has been feeling better.  She is still working on her smoking.  She has been getting outside more and working in yard.  She is excited about the arrival of a little of puppies soon.  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  This has all lifted her spirits!!  She does need to get the welping box built.  She has also been able to work with her horse some too.   Christine Serrano has tapered off her depression meds for 3 weeks now (under MD supervision) and has been feeling great. She thought it was her sickness making her feel so weak, but once she has come off these meds she feels so much "healthier" and is very  prou of herself for doing more around the house     Expected Outcomes  Short: Continue to take Wellbutrin and feeling better.  Long: Continue to enjoy nature and her horses.   Short: Enjoy the puppies  Long: Continue to stay positive!  Short: prepare for the puppies in September, accomplih chores outside (weeding, harvesting fruits for Christine Serrano, etc), celebrate her 58th birthday 7[birth year].. Long:continue to work on self care and enjoy her animals.     Interventions  Encouraged to attend Pulmonary Rehabilitation for the exercise  Encouraged to attend Pulmonary Rehabilitation for the exercise  Encouraged to attend Pulmonary Rehabilitation for the exercise     Continue Psychosocial Services   Follow up required by staff  Follow up required by staff  Follow up required by staff        Psychosocial Discharge (Final Psychosocial Re-Evaluation): Psychosocial Re-Evaluation - 12/02/18 1156      Psychosocial Re-Evaluation   Current issues with  Current Depression;Current Stress Concerns    Comments  Christine Serrano has tapered off her depression meds for 3 weeks now (under MD supervision) and has been feeling great. She thought it was her sickness making her feel so weak, but once she has come off these meds she feels so much "healthier" and is very prou of herself for doing more around the house    Expected Outcomes  Short: prepare for the puppies in September, accomplih chores outside (weeding, harvesting fruits for Christine Serrano, etc), celebrate her 58th birthday 7/20.. Long:continue to work on self care and enjoy her animals.    Interventions  Encouraged to attend Pulmonary Rehabilitation for the exercise    Continue Psychosocial Services   Follow up required by staff       Education: Education Goals: Education classes will be provided on a weekly basis, covering required topics. Participant will state understanding/return demonstration of topics presented.  Learning Barriers/Preferences:   Education Topics:  Initial  Evaluation Education: - Verbal, written and demonstration of respiratory meds, oximetry and breathing techniques. Instruction on use of nebulizers and MDIs and importance of monitoring MDI activations.   Pulmonary Rehab from 08/05/2018 in [facility] Cardiac and Pulmonary Rehab  Date  07/20/18  Educator  [facility]  Instruction Review Code  1- Verbalizes Understanding      General Nutrition Guidelines/Fats and Fiber: -Group instruction provided by verbal, written material, models and posters to present the general guidelines for heart healthy nutrition. Gives an explanation and review of dietary fats and fiber.   Pulmonary Rehab from 08/05/2018 in [facility] Cardiac and Pulmonary Rehab  Date  07/29/18  Educator  [facility]  Instruction Review Code  1- Verbalizes Understanding      Controlling Sodium/Reading Food Labels: -Group verbal and written material supporting the discussion of sodium use in heart healthy nutrition. Review and explanation with models, verbal and written materials for utilization of the food label.   Exercise Physiology & General Exercise Guidelines: - Group verbal and written instruction with models to review the exercise physiology of the cardiovascular system and associated critical values. Provides general exercise guidelines with specific guidelines to those with heart or lung disease.    Aerobic Exercise & Resistance Training: - Gives group verbal and written instruction on the various components of exercise. Focuses on aerobic and resistive training programs and the benefits of this training and how to safely progress through these programs.   Flexibility, Balance, Mind/Body Relaxation: Provides group verbal/written instruction on the benefits of flexibility and balance training, including mind/body exercise modes such as yoga, pilates and tai chi.  Demonstration and skill practice provided.   Stress and Anxiety: - Provides group verbal and written instruction about the health risks  of elevated stress and causes of high stress.  Discuss the correlation between heart/lung disease and anxiety and treatment options. Review healthy ways to manage with stress and anxiety.   Pulmonary Rehab from 08/05/2018 in [facility] Cardiac and Pulmonary Rehab  Date  07/22/18  Educator  [facility]  Instruction Review Code  1- Verbalizes Understanding      Depression: - Provides group verbal and written instruction on the  correlation between heart/lung disease and depressed mood, treatment options, and the stigmas associated with seeking treatment.   Exercise & Equipment Safety: - Individual verbal instruction and demonstration of equipment use and safety with use of the equipment.   Pulmonary Rehab from 08/05/2018 in [facility] Cardiac and Pulmonary Rehab  Date  07/20/18  Educator  [facility]  Instruction Review Code  1- Verbalizes Understanding      Infection Prevention: - Provides verbal and written material to individual with discussion of infection control including proper hand washing and proper equipment cleaning during exercise session.   Pulmonary Rehab from 08/05/2018 in [facility] Cardiac and Pulmonary Rehab  Date  07/20/18  Educator  [facility]  Instruction Review Code  1- Verbalizes Understanding      Falls Prevention: - Provides verbal and written material to individual with discussion of falls prevention and safety.   Pulmonary Rehab from 08/05/2018 in [facility] Cardiac and Pulmonary Rehab  Date  07/20/18  Educator  [facility]  Instruction Review Code  1- Verbalizes Understanding      Diabetes: - Individual verbal and written instruction to review signs/symptoms of diabetes, desired ranges of glucose level fasting, after meals and with exercise. Advice that pre and post exercise glucose checks will be done for 3 sessions at entry of program.   Chronic Lung Diseases: - Group verbal and written instruction to review updates, respiratory medications, advancements in procedures and treatments. Discuss use of  supplemental oxygen including available portable oxygen systems, continuous and intermittent flow rates, concentrators, personal use and safety guidelines. Review proper use of inhaler and spacers. Provide informative websites for self-education.    Energy Conservation: - Provide group verbal and written instruction for methods to conserve energy, plan and organize activities. Instruct on pacing techniques, use of adaptive equipment and posture/positioning to relieve shortness of breath.   Triggers and Exacerbations: - Group verbal and written instruction to review types of environmental triggers and ways to prevent exacerbations. Discuss weather changes, air quality and the benefits of nasal washing. Review warning signs and symptoms to help prevent infections. Discuss techniques for effective airway clearance, coughing, and vibrations.   Pulmonary Rehab from 08/07/2016 in [facility] Cardiac and Pulmonary Rehab  Date  08/07/16  Educator  [facility]  Instruction Review Code (retired)  2- meets goals/outcomes      AED/CPR: - Group verbal and written instruction with the use of models to demonstrate the basic use of the AED with the basic ABC's of resuscitation.   Anatomy and Physiology of the Lungs: - Group verbal and written instruction with the use of models to provide basic lung anatomy and physiology related to function, structure and complications of lung disease.   Anatomy & Physiology of the Heart: - Group verbal and written instruction and models provide basic cardiac anatomy and physiology, with the coronary electrical and arterial systems. Review of Valvular disease and Heart Failure   Cardiac Medications: - Group verbal and written instruction to review commonly prescribed medications for heart disease. Reviews the medication, class of the drug, and side effects.   Know Your Numbers and Risk Factors: -Group verbal and written instruction about important numbers in your health.  Discussion  of what are risk factors and how they play a role in the disease process.  Review of Cholesterol, Blood Pressure, Diabetes, and BMI and the role they play in your overall health.   Sleep Hygiene: -Provides group verbal and written instruction about how sleep can affect your health.  Define sleep hygiene, discuss sleep  cycles and impact of sleep habits. Review good sleep hygiene tips.    Other: -Provides group and verbal instruction on various topics (see comments)    Knowledge Questionnaire Score:    Core Components/Risk Factors/Patient Goals at Admission:   Core Components/Risk Factors/Patient Goals Review:  Goals and Risk Factor Review    Row Name 09/03/18 1342 09/23/18 1103 11/04/18 1027 12/02/18 1207       Core Components/Risk Factors/Patient Goals Review   Personal Goals Review  Weight Management/Obesity;Improve shortness of breath with ADL's;Tobacco Cessation  Weight Management/Obesity;Improve shortness of breath with ADL's;Tobacco Cessation  Weight Management/Obesity;Improve shortness of breath with ADL's;Tobacco Cessation  Weight Management/Obesity;Improve shortness of breath with ADL's;Tobacco Cessation    Review  Patient has been checking her BP at home and was 122/68. She states her weight was down to 120lbs. She is still smoking but wants to quit. Informed patient of the exercise videos that where sent out via email.  Lakeysha has been improving at home.  Her weight is staying stready between 120-123 lbs.  She is now able to do more around the house and breathing better.  She wears a maks when she goes out in public and keeps it in her car.  She is doing better with her smoking and holding at 1/2 ppd.  We will continue to monitor her progress.   Aristea has been doing well at home. Her weight continues to stay steady.  She is still smoking but not increasing.  Her breathing has gotten better overall.    Salena has been maintaining her weight, decreasing her SOB by increasing stamina  and time being active, and not increasing smoking    Expected Outcomes  Short: Exercise and take medications as prescribed to help with overall health. Inform staff of any changes while [facility] is closed. Long: maintain exercise and medications independently to improve upon overall health.  Short: Continue to work on smoking cessation.  Long: Continue to monitor weight.   Short: Continue to work on smoking  Long: Continue to monitor risk factors.   Short: continue working on SOB, Long: decrease smoking       Core Components/Risk Factors/Patient Goals at Discharge (Final Review):  Goals and Risk Factor Review - 12/02/18 1207      Core Components/Risk Factors/Patient Goals Review   Personal Goals Review  Weight Management/Obesity;Improve shortness of breath with ADL's;Tobacco Cessation    Review  Christine Serrano has been maintaining her weight, decreasing her SOB by increasing stamina and time being active, and not increasing smoking    Expected Outcomes  Short: continue working on SOB, Long: decrease smoking       ITP Comments: ITP Comments    Row Name 09/03/18 1338 09/21/18 0956 09/23/18 1054 01/13/19 1025 01/27/19 1004   ITP Comments  Our program is currently closed due to COVID-19.  We are communicating with patient via phone calls and emails.  30 day review completed. ITP sent to Dr. Emily Filbert for review,changes as needed and signature. Continue with ITP unless changes directed by Dr. Sabra Heck.   Christine Serrano had been out due to a bad cold followed by an ulcer flare up.   Scheduled to return on 8/3.  Exctied to come back.  REturned to Pulmonary Rehab after we were closed for COVID 19.  ITP sent to Dr Sabra Heck for review, changes as needed and signature.   [place] Name 02/23/19 0908 02/24/19 0558         ITP Comments  Unable to complete nutrition 30-day  re-eval cycle due to inconsistent attendence  30 Day review. Continue with ITP unless directed changes per Medical Director review.  8/19 last visit          Comments:

## 2019-02-25 ENCOUNTER — Telehealth: Payer: Self-pay

## 2019-02-25 ENCOUNTER — Encounter: Payer: Self-pay | Admitting: Family Medicine

## 2019-02-25 ENCOUNTER — Ambulatory Visit (INDEPENDENT_AMBULATORY_CARE_PROVIDER_SITE_OTHER): Payer: Medicare Other | Admitting: Family Medicine

## 2019-02-25 VITALS — BP 112/68 | HR 92 | Temp 98.2°F | Ht 64.0 in | Wt 111.2 lb

## 2019-02-25 DIAGNOSIS — F5104 Psychophysiologic insomnia: Secondary | ICD-10-CM

## 2019-02-25 DIAGNOSIS — M810 Age-related osteoporosis without current pathological fracture: Secondary | ICD-10-CM

## 2019-02-25 DIAGNOSIS — Z7189 Other specified counseling: Secondary | ICD-10-CM | POA: Diagnosis not present

## 2019-02-25 DIAGNOSIS — K5909 Other constipation: Secondary | ICD-10-CM

## 2019-02-25 DIAGNOSIS — Z23 Encounter for immunization: Secondary | ICD-10-CM

## 2019-02-25 DIAGNOSIS — Z0000 Encounter for general adult medical examination without abnormal findings: Secondary | ICD-10-CM | POA: Insufficient documentation

## 2019-02-25 DIAGNOSIS — F331 Major depressive disorder, recurrent, moderate: Secondary | ICD-10-CM

## 2019-02-25 DIAGNOSIS — R011 Cardiac murmur, unspecified: Secondary | ICD-10-CM

## 2019-02-25 DIAGNOSIS — E673 Hypervitaminosis D: Secondary | ICD-10-CM | POA: Diagnosis not present

## 2019-02-25 DIAGNOSIS — Z720 Tobacco use: Secondary | ICD-10-CM

## 2019-02-25 DIAGNOSIS — E785 Hyperlipidemia, unspecified: Secondary | ICD-10-CM

## 2019-02-25 DIAGNOSIS — R0989 Other specified symptoms and signs involving the circulatory and respiratory systems: Secondary | ICD-10-CM

## 2019-02-25 DIAGNOSIS — J439 Emphysema, unspecified: Secondary | ICD-10-CM

## 2019-02-25 DIAGNOSIS — I700 Atherosclerosis of aorta: Secondary | ICD-10-CM

## 2019-02-25 DIAGNOSIS — F419 Anxiety disorder, unspecified: Secondary | ICD-10-CM

## 2019-02-25 LAB — VITAMIN D 25 HYDROXY (VIT D DEFICIENCY, FRACTURES): VITD: >120 ng/mL

## 2019-02-25 MED ORDER — NICOTINE 14 MG/24HR TD PT24: 14.0000 mg | MEDICATED_PATCH | Freq: Every day | TRANSDERMAL | 3 refills | Status: DC

## 2019-02-25 NOTE — Patient Instructions (Addendum)
Flu shot today.  Labs today If interested, check with pharmacy about new 2 shot shingles series (shingrix).  Schedule eye doctor as you're due.  I have sent in higher nicoderm dose - continue working on smoking cessation. Restart wellbutrin.  Return as needed or in 6 months for follow up visit.   Health Maintenance for Postmenopausal Women Menopause is a normal process in which your ability to get pregnant comes to an end. This process happens slowly over many months or years, usually between the ages of 75 and 28. Menopause is complete when you have missed your menstrual periods for 12 months. It is important to talk with your health care provider about some of the most common conditions that affect women after menopause (postmenopausal women). These include heart disease, cancer, and bone loss (osteoporosis). Adopting a healthy lifestyle and getting preventive care can help to promote your health and wellness. The actions you take can also lower your chances of developing some of these common conditions. What should I know about menopause? During menopause, you may get a number of symptoms, such as:  Hot flashes. These can be moderate or severe.  Night sweats.  Decrease in sex drive.  Mood swings.  Headaches.  Tiredness.  Irritability.  Memory problems.  Insomnia. Choosing to treat or not to treat these symptoms is a decision that you make with your health care provider. Do I need hormone replacement therapy?  Hormone replacement therapy is effective in treating symptoms that are caused by menopause, such as hot flashes and night sweats.  Hormone replacement carries certain risks, especially as you become older. If you are thinking about using estrogen or estrogen with progestin, discuss the benefits and risks with your health care provider. What is my risk for heart disease and stroke? The risk of heart disease, heart attack, and stroke increases as you age. One of the causes  may be a change in the body's hormones during menopause. This can affect how your body uses dietary fats, triglycerides, and cholesterol. Heart attack and stroke are medical emergencies. There are many things that you can do to help prevent heart disease and stroke. Watch your blood pressure  High blood pressure causes heart disease and increases the risk of stroke. This is more likely to develop in people who have high blood pressure readings, are of African descent, or are overweight.  Have your blood pressure checked: ? Every 3-5 years if you are 74-58 years of age. ? Every year if you are 3 years old or older. Eat a healthy diet   Eat a diet that includes plenty of vegetables, fruits, low-fat dairy products, and lean protein.  Do not eat a lot of foods that are high in solid fats, added sugars, or sodium. Get regular exercise Get regular exercise. This is one of the most important things you can do for your health. Most adults should:  Try to exercise for at least 150 minutes each week. The exercise should increase your heart rate and make you sweat (moderate-intensity exercise).  Try to do strengthening exercises at least twice each week. Do these in addition to the moderate-intensity exercise.  Spend less time sitting. Even light physical activity can be beneficial. Other tips  Work with your health care provider to achieve or maintain a healthy weight.  Do not use any products that contain nicotine or tobacco, such as cigarettes, e-cigarettes, and chewing tobacco. If you need help quitting, ask your health care provider.  Know your numbers.  Ask your health care provider to check your cholesterol and your blood sugar (glucose). Continue to have your blood tested as directed by your health care provider. Do I need screening for cancer? Depending on your health history and family history, you may need to have cancer screening at different stages of your life. This may include  screening for:  Breast cancer.  Cervical cancer.  Lung cancer.  Colorectal cancer. What is my risk for osteoporosis? After menopause, you may be at increased risk for osteoporosis. Osteoporosis is a condition in which bone destruction happens more quickly than new bone creation. To help prevent osteoporosis or the bone fractures that can happen because of osteoporosis, you may take the following actions:  If you are 61-68 years old, get at least 1,000 mg of calcium and at least 600 mg of vitamin D per day.  If you are older than age 73 but younger than age 14, get at least 1,200 mg of calcium and at least 600 mg of vitamin D per day.  If you are older than age 81, get at least 1,200 mg of calcium and at least 800 mg of vitamin D per day. Smoking and drinking excessive alcohol increase the risk of osteoporosis. Eat foods that are rich in calcium and vitamin D, and do weight-bearing exercises several times each week as directed by your health care provider. How does menopause affect my mental health? Depression may occur at any age, but it is more common as you become older. Common symptoms of depression include:  Low or sad mood.  Changes in sleep patterns.  Changes in appetite or eating patterns.  Feeling an overall lack of motivation or enjoyment of activities that you previously enjoyed.  Frequent crying spells. Talk with your health care provider if you think that you are experiencing depression. General instructions See your health care provider for regular wellness exams and vaccines. This may include:  Scheduling regular health, dental, and eye exams.  Getting and maintaining your vaccines. These include: ? Influenza vaccine. Get this vaccine each year before the flu season begins. ? Pneumonia vaccine. ? Shingles vaccine. ? Tetanus, diphtheria, and pertussis (Tdap) booster vaccine. Your health care provider may also recommend other immunizations. Tell your health care  provider if you have ever been abused or do not feel safe at home. Summary  Menopause is a normal process in which your ability to get pregnant comes to an end.  This condition causes hot flashes, night sweats, decreased interest in sex, mood swings, headaches, or lack of sleep.  Treatment for this condition may include hormone replacement therapy.  Take actions to keep yourself healthy, including exercising regularly, eating a healthy diet, watching your weight, and checking your blood pressure and blood sugar levels.  Get screened for cancer and depression. Make sure that you are up to date with all your vaccines. This information is not intended to replace advice given to you by your health care provider. Make sure you discuss any questions you have with your health care provider. Document Released: 07/26/2005 Document Revised: 05/27/2018 Document Reviewed: 05/27/2018 Elsevier Patient Education  2020 [employer].

## 2019-02-25 NOTE — Telephone Encounter (Signed)
[facility] resulted a critical @ 1533  Vitamin D greater than 120

## 2019-02-25 NOTE — Assessment & Plan Note (Signed)

## 2019-02-25 NOTE — Assessment & Plan Note (Addendum)
Advanced directive discussion: has at home. HCPOA is partner Angela Smith. Thinks would be ok with CPR, but wouldn't want prolonged life support if terminal condition. Doesn't think she would want trial of intubation. Asked to bring us a copy.  °

## 2019-02-25 NOTE — Telephone Encounter (Signed)
Please make sure she isn't on any extra vit D and I'll route this to PCP as FYI.  Thanks.

## 2019-02-25 NOTE — Telephone Encounter (Signed)
[facility] called a critical result @ 1530  Vit D greater than 120

## 2019-02-25 NOTE — Telephone Encounter (Addendum)
Pt returning call.  I asked her about her AVS.  Says she did not get all pages.  Offered my apologies and notified her I will mail the rest.  Pt agrees and expresses her thanks.

## 2019-02-25 NOTE — Progress Notes (Signed)
This visit was conducted in person.  BP 112/68 (BP Location: Left Arm, Patient Position: Sitting, Cuff Size: Large)   Pulse 92   Temp 98.2 F (36.8 C) (Tympanic)   Ht 5\' 4"  (1.626 m)   Wt 111 lb 4 oz (50.5 kg)   SpO2 97% Comment: 3 L  BMI 19.10 kg/m    CC: AMW - 1 month early for CPE Subjective:    Patient ID: Christine Serrano, female    DOB: [birth year], 58 y.o.   MRN: [id number]  HPI: Christine Serrano is a 58 y.o. female presenting on 02/25/2019 for Medicare Wellness (Wants to discuss increase in Nicoderm.)   Did not see health advisor this year for medicare wellness visit.   Hearing Screening   125Hz  250Hz  500Hz  1000Hz  2000Hz  3000Hz  4000Hz  6000Hz  8000Hz   Right ear:   20 20 20   0    Left ear:   25 25 20   40      Visual Acuity Screening   Right eye Left eye Both eyes  Without correction: 20/40 20/25 20/25   With correction:         Office Visit from 02/25/2019 in [place] at [facility] Total Score  2      Fall Risk  02/25/2019 03/09/2018 02/06/2018 02/05/2017 06/11/2016  Falls in the past year? 1 No No No No  Number falls in past yr: 1 - - - -  Injury with Fall? 1 - - - -  Tripped over her dog, no injury.   Smoking - has been on wellbutrin SR 100mg  daily. Was down to 6 cig/day, now up to 1.5 ppd. On nicoderm 7mg , requests higher dose sent in.   She had stopped mood medicines but noticed worsening mood - slowly restarting sertraline and wellbutrin SR. Struggling with covid pandemic.   Vit D levels elevated - was taking 5000 IU 2 tabs bid.  Preventative: COLONOSCOPY WITH PROPOFOL 08/04/2017 - TAx3, angiodysplastic lesion, diverticulosis, rpt 5 yrs ([place]) Mammogram 05/2018 - Birads1  Well woman exam - h/o cervical abnormalities s/p conization 1978, then laser surgery 1987. Pap normal 02/2017 - consider Q3 yrs.  DEXA: 05/2018 - T -3.5 hip, -2.6 spine. Started prolia 05/2018, received last dose 01/21/2019.  Flu shot yearly  Pneumovax 2010. prevnar  04/2018 Tetanus - declines for now shingrix - discussed.  Advanced directive discussion: has at home. HCPOA is partner [name]. Thinks would be ok with CPR, but wouldn't want prolonged life support if terminal condition. Doesn't think she would want trial of intubation. Asked to bring [place] a copy.  Seat belt use discussed Sunscreen use discussed. No changing moles on skin.  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 Interim medical history since our last visit reviewed. Allergies and medications reviewed and updated. Outpatient Medications Prior to Visit  Medication Sig Dispense Refill  . aspirin EC 81 MG tablet Take 81 mg by mouth daily.    . benzonatate (TESSALON) 100 MG capsule Take 1 capsule (100 mg total) by mouth 2 (two) times daily as needed for cough. [place]  capsule 1  . buPROPion (WELLBUTRIN SR) 100 MG 12 hr tablet TAKE 1 TABLET BY MOUTH EVERY DAY 90 tablet 0  . fluticasone (FLONASE) 50 MCG/ACT nasal spray Place 2 sprays into both nostrils daily. 48 g 3  . gabapentin (NEURONTIN) 100 MG capsule Take 1 capsule (100 mg total) by mouth 2 (two) times daily. 180 capsule 3  . hydrOXYzine (ATARAX/VISTARIL) 25 MG tablet TAKE 1 TABLET (25 MG TOTAL) BY MOUTH 2 (TWO) TIMES DAILY AS NEEDED FOR ANXIETY. 180 tablet 0  . ipratropium-albuterol (DUONEB) 0.5-2.5 (3) MG/3ML SOLN Inhale 3 mLs into the lungs every 6 (six) hours as needed. 360 mL 3  . LINZESS 290 MCG CAPS capsule Take 1 capsule (290 mcg total) by mouth daily as needed (constipation). 30 capsule 3  . Multiple  Vitamins-Minerals (MULTIVITAMIN ADULT) TABS Take 1 tablet by mouth daily.    . naproxen (NAPROSYN) 375 MG tablet Take 1 tablet (375 mg total) by mouth 2 (two) times daily as needed for moderate pain. 40 tablet 1  . omeprazole (PRILOSEC) 40 MG capsule TAKE 1 CAPSULE BY MOUTH DAILY. FOR THREE WEEKS THEN AS NEEDED. TAKE 30 MIN PRIOR TO LARGE MEAL 90 capsule 1  . OXYGEN Inhale 3 L/min into the lungs continuous.    . polyethylene glycol powder (GLYCOLAX/MIRALAX) powder Take 17 g by mouth daily as needed for moderate constipation. 3350 g 1  . PROAIR HFA 108 (90 Base) MCG/ACT inhaler TAKE 2 PUFFS BY MOUTH EVERY 6 HOURS AS NEEDED FOR WHEEZE OR SHORTNESS OF BREATH 54 g 1  . SYMBICORT 160-4.5 MCG/ACT inhaler Inhale 2 puffs into the lungs 2 (two) times daily. 3 Inhaler 3  . tiotropium (SPIRIVA) 18 MCG inhalation capsule Place 1 capsule (18 mcg total) into inhaler and inhale daily. 90 capsule 4  . tiZANidine (ZANAFLEX) 2 MG tablet TAKE 1 TABLET (2 MG TOTAL) BY MOUTH 2 (TWO) TIMES DAILY AS NEEDED FOR MUSCLE SPASMS (NECK PAIN). 60 tablet 1  . traZODone (DESYREL) 50 MG tablet Take 1 tablet (50 mg total) by mouth at bedtime. 90 tablet 3  . vitamin B-12 (CYANOCOBALAMIN) 1000 MCG tablet Take 1,000 mcg by mouth daily.    . vitamin E 400 UNIT capsule Take 400 Units by mouth daily.     . clotrimazole (LOTRIMIN) 1 % cream Apply 1 application topically 2 (two) times daily. For 2 weeks to rash 60 g 0  . nicotine (NICODERM CQ) 7 mg/24hr patch Place 1 patch (7 mg total) onto the skin daily. (Patient not taking: Reported on 02/25/2019) 28 patch 3  . triamcinolone cream (KENALOG) 0.1 % APPLY TO AFFECTED AREA TWICE A DAY 45 g 0   No facility-administered medications prior to visit.      Per HPI unless specifically indicated in ROS section below Review of Systems Objective:    BP 112/68 (BP Location: Left Arm, Patient Position: Sitting, Cuff Size: Large)   Pulse 92   Temp 98.2 F (36.8 C) (Tympanic)   Ht 5\' 4"  (1.626 m)    Wt 111 lb 4 oz (50.5 kg)   SpO2 97% Comment: 3 L  BMI 19.10 kg/m   Wt Readings from Last 3 Encounters:  02/25/19 111 lb 4 oz (50.5 kg)  12/04/18 115 lb (52.2 kg)  12/01/18 114 lb (51.7 kg)    Physical Exam Vitals signs and nursing note reviewed.  Constitutional:      General: She is not in acute distress.    Appearance: Normal appearance. She is well-developed.  Comments: Frail appearing with supplemental O2 via [place]  HENT:     Head: Normocephalic and atraumatic.     Right Ear: Hearing, tympanic membrane, ear canal and external ear normal.     Left Ear: Hearing, tympanic membrane, ear canal and external ear normal.     Nose: Nose normal.     Mouth/Throat:     Mouth: Mucous membranes are moist.     Pharynx: Uvula midline. No oropharyngeal exudate or posterior oropharyngeal erythema.  Eyes:     General: No scleral icterus.    Conjunctiva/sclera: Conjunctivae normal.     Pupils: Pupils are equal, round, and reactive to light.  Neck:     Musculoskeletal: Normal range of motion and neck supple.     Vascular: Carotid bruit (L sided) present.  Cardiovascular:     Rate and Rhythm: Normal rate and regular rhythm.     Pulses: Normal pulses.          Radial pulses are 2+ on the right side and 2+ on the left side.     Heart sounds: Murmur (faint systolic) present.  Pulmonary:     Effort: Pulmonary effort is normal. No respiratory distress.     Breath sounds: Normal breath sounds. No wheezing, rhonchi or rales.  Abdominal:     General: Abdomen is flat. Bowel sounds are normal. There is no distension.     Palpations: Abdomen is soft. There is no mass.     Tenderness: There is no abdominal tenderness. There is no guarding or rebound.     Hernia: No hernia is present.  Musculoskeletal: Normal range of motion.     Right lower leg: No edema.     Left lower leg: No edema.  Lymphadenopathy:     Cervical: No cervical adenopathy.  Skin:    General: Skin is warm and dry.     Findings:  No rash.  Neurological:     General: No focal deficit present.     Mental Status: She is alert and oriented to person, place, and time.     Comments:  CN grossly intact, station and gait intact Recall 3/3 Calculation 5/5 [id number]  Psychiatric:        Mood and Affect: Mood normal.        Behavior: Behavior normal.        Thought Content: Thought content normal.        Judgment: Judgment normal.       Results for orders placed or performed in visit on 02/25/19  VITAMIN D 25 Hydroxy (Vit-D Deficiency, Fractures)  Result Value Ref Range   VITD >120 ng/mL   Depression screen [facility] 2/9 02/25/2019 08/13/2018 07/20/2018 03/09/2018 02/06/2018  Decreased Interest 1 2 3 2 1   Down, Depressed, Hopeless 1 2 3 3 3   PHQ - 2 Score 2 4 6 5 4   Altered sleeping 2 3 3 3 1   Tired, decreased energy 3 2 3 3 3   Change in appetite 2 1 3 2 1   Feeling bad or failure about yourself  1 3 2 1 1   Trouble concentrating 0 1 3 1  0  Moving slowly or fidgety/restless 0 0 0 1 0  Suicidal thoughts 0 0 0 0 0  PHQ-9 Score 10 14 20 16 10   Difficult doing work/chores - - Very difficult Extremely dIfficult Very difficult  Some recent data might be hidden    GAD 7 : Generalized Anxiety Score 02/25/2019 08/13/2018 08/18/2017 07/07/2017  Nervous, Anxious, on Edge 1  1 1 3   Control/stop worrying 1 1 0 3  Worry too much - different things 1 1 0 3  Trouble relaxing 1 1 0 1  Restless 0 1 0 1  Easily annoyed or irritable 2 2 1 3   Afraid - awful might happen 0 1 0 0  Total GAD 7 Score 6 8 2 14    Assessment & Plan:   Problem List Items Addressed This Visit    Tobacco abuse    Continue to encourage cessation. Increased smoking noted. Will increase nicoderm to 14mg  dose, discussed to limit cigarette use while using patch. She has restarted wellbutrin as well.       Systolic murmur    Mild noted today. Will reassess next visit.       Osteoporosis    Was overusing vit D - see below.  Tolerating prolia well - continue this. Last  injection 01/2019.      Relevant Orders   VITAMIN D 25 Hydroxy (Vit-D Deficiency, Fractures) (Completed)   Moderate recurrent major depression ([place])    She had stopped antidepressants and noted deterioration in mood. Now improving since she has restarted wellbutrin, trazodone. PHQ9 and GAD7 scores reviewed.       Medicare annual wellness visit, subsequent - Primary    I have personally reviewed the Medicare Annual Wellness questionnaire and have noted 1. The patient's medical and social history 2. Their use of alcohol, tobacco or illicit drugs 3. Their current medications and supplements 4. The patient's functional ability including ADL's, fall risks, home safety risks and hearing or visual impairment. Cognitive function has been assessed and addressed as indicated.  5. Diet and physical activity 6. Evidence for depression or mood disorders The patients weight, height, BMI have been recorded in the chart. I have made referrals, counseling and provided education to the patient based on review of the above and I have provided the pt with a written personalized care plan for preventive services. Provider list updated.. See scanned questionairre as needed for further documentation. Reviewed preventative protocols and updated unless pt declined.       Left carotid bruit    Marked, noted today. Will check carotid US for further evaluation.       Relevant Orders   VAS US CAROTID   Hypervitaminosis D    Was overdosing vit d (10000 iu bid). Now off this for last few days, will recheck levels today.       HLD (hyperlipidemia)    Chronic, off statin. The 10-year ASCVD risk score Mikey Bussing DC Brooke Bonito., et al., 2013) is: 5.9%   Values used to calculate the score:     Age: 18 years     Sex: Female     Is Non-Hispanic African American: No     Diabetic: No     Tobacco smoker: Yes     Systolic Blood Pressure: [id number] mmHg     Is BP treated: No     HDL Cholesterol: 49.4 mg/dL     Total Cholesterol: 207  mg/dL       COPD (chronic obstructive pulmonary disease) (HCC)    Severe oxygen dependent COPD. Appreciate pulm care ([place])      Relevant Medications   nicotine (NICODERM CQ) 14 mg/24hr patch   Chronic insomnia    Continue trazodone 50mg  nightly.       Chronic constipation    Continues linzess for CIC       Aortic atherosclerosis (HCC)    Continue aspirin. Consider statin.  Anxiety    Continues prn hydroxyzine      Advanced directives, counseling/discussion    Advanced directive discussion: has at home. HCPOA is partner [name]. Thinks would be ok with CPR, but wouldn't want prolonged life support if terminal condition. Doesn't think she would want trial of intubation. Asked to bring [place] a copy.        Other Visit Diagnoses    Need for influenza vaccination       Relevant Orders   Flu Vaccine QUAD 36+ mos IM (Completed)       Meds ordered this encounter  Medications  . nicotine (NICODERM CQ) 14 mg/24hr patch    Sig: Place 1 patch (14 mg total) onto the skin daily.    Dispense:  28 patch    Refill:  3   Orders Placed This Encounter  Procedures  . Flu Vaccine QUAD 36+ mos IM  . VITAMIN D 25 Hydroxy (Vit-D Deficiency, Fractures)    Patient instructions: Flu shot today.  Labs today If interested, check with pharmacy about new 2 shot shingles series (shingrix).  Schedule eye doctor as you're due.  I have sent in higher nicoderm dose - continue working on smoking cessation. Restart wellbutrin.  Return as needed or in 6 months for follow up visit.   Follow up plan: Return in about 6 months (around 08/25/2019) for follow up visit.  Ria Bush, MD

## 2019-02-25 NOTE — Telephone Encounter (Signed)
Left message on vm asking pt to call back.   I wanted to make sure she received all 9 pages of her After Visit Summary.

## 2019-02-26 NOTE — Telephone Encounter (Signed)
Recommend remain off vitamin D at this time.  Schedule rpt lab visit in 3 wks to recheck levels.

## 2019-02-26 NOTE — Telephone Encounter (Signed)
See other phone note

## 2019-02-26 NOTE — Telephone Encounter (Signed)
Spoke with pt relaying Dr. Synthia Innocent message.  Pt scheduled lab visit on 03/19/19 at 11:30.

## 2019-02-27 ENCOUNTER — Telehealth: Payer: Self-pay | Admitting: Family Medicine

## 2019-02-27 DIAGNOSIS — R011 Cardiac murmur, unspecified: Secondary | ICD-10-CM | POA: Insufficient documentation

## 2019-02-27 DIAGNOSIS — I6502 Occlusion and stenosis of left vertebral artery: Secondary | ICD-10-CM | POA: Insufficient documentation

## 2019-02-27 DIAGNOSIS — R0989 Other specified symptoms and signs involving the circulatory and respiratory systems: Secondary | ICD-10-CM | POA: Insufficient documentation

## 2019-02-27 DIAGNOSIS — E673 Hypervitaminosis D: Secondary | ICD-10-CM | POA: Insufficient documentation

## 2019-02-27 NOTE — Assessment & Plan Note (Signed)
Continue trazodone 50 mg nightly.

## 2019-02-27 NOTE — Assessment & Plan Note (Signed)
Was overusing vit D - see below.  Tolerating prolia well - continue this. Last injection 01/2019.

## 2019-02-27 NOTE — Assessment & Plan Note (Signed)
Mild noted today. Will reassess next visit.

## 2019-02-27 NOTE — Telephone Encounter (Signed)
plz call - I forgot to discuss at [place] but heard extra sound L neck artery - has she been told may have bruit or blockage in left neck artery before? I'd like to check neck ultrasound for further evaluation - will call her to schedule this.

## 2019-02-27 NOTE — Assessment & Plan Note (Signed)
Continues prn hydroxyzine

## 2019-02-27 NOTE — Assessment & Plan Note (Signed)
Severe oxygen dependent COPD. Appreciate pulm care ([place])

## 2019-02-27 NOTE — Assessment & Plan Note (Signed)
She had stopped antidepressants and noted deterioration in mood. Now improving since she has restarted wellbutrin, trazodone. PHQ9 and GAD7 scores reviewed.

## 2019-02-27 NOTE — Assessment & Plan Note (Signed)
Was overdosing vit d (10000 iu bid). Now off this for last few days, will recheck levels today.

## 2019-02-27 NOTE — Assessment & Plan Note (Signed)
Continue to encourage cessation. Increased smoking noted. Will increase nicoderm to 14mg  dose, discussed to limit cigarette use while using patch. She has restarted wellbutrin as well.

## 2019-02-27 NOTE — Assessment & Plan Note (Signed)
Chronic, off statin. The 10-year ASCVD risk score Mikey Bussing DC Brooke Bonito., et al., 2013) is: 5.9%   Values used to calculate the score:     Age: 58 years     Sex: Female     Is Non-Hispanic African American: No     Diabetic: No     Tobacco smoker: Yes     Systolic Blood Pressure: [id number] mmHg     Is BP treated: No     HDL Cholesterol: 49.4 mg/dL     Total Cholesterol: 207 mg/dL

## 2019-02-27 NOTE — Assessment & Plan Note (Addendum)
Continues linzess for CIC

## 2019-02-27 NOTE — Assessment & Plan Note (Signed)
Continue aspirin. Consider statin.  

## 2019-02-27 NOTE — Assessment & Plan Note (Signed)
Marked, noted today. Will check carotid US for further evaluation.

## 2019-03-01 ENCOUNTER — Encounter: Payer: Self-pay | Admitting: *Deleted

## 2019-03-01 ENCOUNTER — Telehealth: Payer: Self-pay | Admitting: *Deleted

## 2019-03-01 DIAGNOSIS — J449 Chronic obstructive pulmonary disease, unspecified: Secondary | ICD-10-CM

## 2019-03-01 NOTE — Telephone Encounter (Signed)
Left message on vm for pt to call back.  Need to relay Dr. G's message.  

## 2019-03-01 NOTE — Telephone Encounter (Addendum)
Spoke with pt relaying Dr. Synthia Innocent message.  Pt denies ever being told about bruit/blockage.  She verbalizes understanding and will wait for the call about referral.  Fyi to Dr. Darnell Level.

## 2019-03-01 NOTE — Telephone Encounter (Signed)
Called to check to on patient.  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  She is also just not able to get up and get moving in the morning.  She request to change class times to 1115.

## 2019-03-01 NOTE — Telephone Encounter (Signed)
Patient returned your call.

## 2019-03-03 ENCOUNTER — Encounter: Payer: Medicare Other | Admitting: *Deleted

## 2019-03-03 ENCOUNTER — Other Ambulatory Visit: Payer: Self-pay

## 2019-03-03 DIAGNOSIS — J9611 Chronic respiratory failure with hypoxia: Secondary | ICD-10-CM | POA: Diagnosis not present

## 2019-03-03 DIAGNOSIS — J449 Chronic obstructive pulmonary disease, unspecified: Secondary | ICD-10-CM

## 2019-03-03 NOTE — Progress Notes (Signed)
Daily Session Note  Patient Details  Name: Christine Serrano MRN: [id number] Date of Birth: August 10, 1960 Referring Provider:     Pulmonary Rehab from 07/20/2018 in [facility] Cardiac [facility] Pulmonary [facility]hab  Referring Provider  Louretta Parma MD      Encounter Date: 03/03/2019  Check In: Session Check In - 03/03/19 1131      Check-In   Supervising physician immediately available to respond to emergencies  See telemetry face sheet for immediately available ER MD    Location  [facility]-Cardiac & Pulmonary Rehab    Staff Present  Darel Hong, RN BSN;Jessica Luan Pulling, MA, RCEP, CCRP, CCET;Joseph [place] RCP,RRT,BSRT    Virtual Visit  No    Medication changes reported      No    Fall or balance concerns reported     No    Warm-up and Cool-down  Performed on first and last piece of equipment    Resistance Training Performed  Yes    VAD Patient?  No    PAD/SET Patient?  No      Pain Assessment   Currently in Pain?  No/denies    Multiple Pain Sites  No          Social History   Tobacco Use  Smoking Status Current Every Day Smoker  . Packs/day: 0.50  . Years: 47.00  . Pack years: 23.50  . Types: Cigarettes  . Last attempt to quit: 07/25/2018  . Years since quitting: 0.6  Smokeless Tobacco Never Used  Tobacco Comment   02/03/19 thinking about quitting more and will give up smoking in house    Goals Met:  Independence with exercise equipment Exercise tolerated well No report of cardiac concerns or symptoms Strength training completed today  Goals Unmet:  Not Applicable  Comments: Pt able to follow exercise prescription today without complaint.  Will continue to monitor for progression.    Dr. Emily Filbert is Medical Director for [place] and [facility].

## 2019-03-05 ENCOUNTER — Other Ambulatory Visit: Payer: Self-pay

## 2019-03-05 DIAGNOSIS — J9611 Chronic respiratory failure with hypoxia: Secondary | ICD-10-CM | POA: Diagnosis not present

## 2019-03-05 DIAGNOSIS — J449 Chronic obstructive pulmonary disease, unspecified: Secondary | ICD-10-CM

## 2019-03-05 NOTE — Progress Notes (Signed)
Daily Session Note  Patient Details  Name: Christine Serrano MRN: [id number] Date of Birth: [birth year] Referring Provider:     Pulmonary Rehab from 07/20/2018 in [facility] Cardiac and Pulmonary Rehab  Referring Provider  Louretta Parma MD      Encounter Date: 03/05/2019  Check In: Session Check In - 03/05/19 1124      Check-In   Supervising physician immediately available to respond to emergencies  See telemetry face sheet for immediately available ER MD    Location  [facility]-Cardiac & Pulmonary Rehab    Staff Present  Vida Rigger RN, Vickki Hearing, BA, ACSM CEP, Exercise Physiologist;Jessica [place], MA, RCEP, CCRP, CCET;Joseph [place] [employer] [place] [employer]    Virtual Visit  No    Medication changes reported      No    Fall or balance concerns reported     No    Warm-up and Cool-down  Performed on first and last piece of equipment    Resistance Training Performed  Yes    VAD Patient?  No    PAD/SET Patient?  No      Pain Assessment   Currently in Pain?  No/denies    Multiple Pain Sites  No          Social History   Tobacco Use  Smoking Status Current Every Day Smoker  . Packs/day: 0.50  . Years: 47.00  . Pack years: 23.50  . Types: Cigarettes  . Last attempt to quit: 07/25/2018  . Years since quitting: 0.6  Smokeless Tobacco Never Used  Tobacco Comment   02/03/19 thinking about quitting more and will give up smoking in house    Goals Met:  Proper associated with RPD/PD & O2 Sat Independence with exercise equipment Exercise tolerated well Personal goals reviewed No report of cardiac concerns or symptoms Strength training completed today  Goals Unmet:  Not Applicable  Comments: Pt able to follow exercise prescription today without complaint.  Will continue to monitor for progression.   Dr. Emily Filbert is Medical Director for [facility].

## 2019-03-08 ENCOUNTER — Ambulatory Visit: Payer: Medicare Other

## 2019-03-10 ENCOUNTER — Ambulatory Visit: Payer: Medicare Other

## 2019-03-12 ENCOUNTER — Ambulatory Visit: Payer: Medicare Other

## 2019-03-15 ENCOUNTER — Encounter: Payer: Self-pay | Admitting: *Deleted

## 2019-03-15 ENCOUNTER — Telehealth: Payer: Self-pay | Admitting: *Deleted

## 2019-03-15 ENCOUNTER — Ambulatory Visit: Payer: Medicare Other

## 2019-03-15 DIAGNOSIS — J449 Chronic obstructive pulmonary disease, unspecified: Secondary | ICD-10-CM

## 2019-03-15 NOTE — Telephone Encounter (Signed)
Christine Serrano called out on Friday with low O2 saturations.  Today she is feeling better but slept til 1045.   She is hoping to attend on Wednesday.

## 2019-03-16 ENCOUNTER — Other Ambulatory Visit: Payer: Self-pay | Admitting: Family Medicine

## 2019-03-16 NOTE — Telephone Encounter (Addendum)
Pt given Prolia shot on 01/21/19.  Fyi to Charmaine.

## 2019-03-17 ENCOUNTER — Ambulatory Visit: Payer: Medicare Other

## 2019-03-18 ENCOUNTER — Other Ambulatory Visit: Payer: Self-pay | Admitting: Family Medicine

## 2019-03-18 DIAGNOSIS — E785 Hyperlipidemia, unspecified: Secondary | ICD-10-CM

## 2019-03-18 DIAGNOSIS — E673 Hypervitaminosis D: Secondary | ICD-10-CM

## 2019-03-18 NOTE — Telephone Encounter (Signed)
Left message for pt to call back.  Need to know if pt has restarted sertraline.  Per OV, 12/01/18, pt had weaned off sertraline.

## 2019-03-19 ENCOUNTER — Other Ambulatory Visit: Payer: Medicare Other

## 2019-03-19 ENCOUNTER — Ambulatory Visit: Payer: Medicare Other

## 2019-03-19 NOTE — Telephone Encounter (Signed)
Left message for pt to call back.  Need to know if pt has restarted sertraline.  Per OV, 12/01/18, pt had weaned off sertraline.

## 2019-03-22 ENCOUNTER — Ambulatory Visit: Payer: Medicare Other

## 2019-03-22 DIAGNOSIS — J449 Chronic obstructive pulmonary disease, unspecified: Secondary | ICD-10-CM | POA: Diagnosis not present

## 2019-03-24 ENCOUNTER — Ambulatory Visit: Payer: Medicare Other

## 2019-03-24 ENCOUNTER — Encounter: Payer: Self-pay | Admitting: *Deleted

## 2019-03-24 DIAGNOSIS — J449 Chronic obstructive pulmonary disease, unspecified: Secondary | ICD-10-CM

## 2019-03-24 NOTE — Progress Notes (Signed)
Pulmonary Individual Treatment Plan  Patient Details  Name: Christine Serrano MRN: [id number] Date of Birth: 01/31/1961 Referring Provider:     Pulmonary Rehab from 07/20/2018 in [facility]  Referring Provider  Louretta Parma MD      Initial Encounter Date:    Pulmonary Rehab from 07/20/2018 in [facility] Re[facility]  Date  07/20/18      Visit Diagnosis: COPD, severe ([place])  Patient's Home Medications on Admission:  Current Outpatient Medications:  .  aspirin EC 81 MG tablet, Take 81 mg by mouth daily., Disp: , Rfl:  .  benzonatate (TESSALON) 100 MG capsule, Take 1 capsule (100 mg total) by mouth 2 (two) times daily as needed for cough., Disp: 30 capsule, Rfl: 1 .  buPROPion (WELLBUTRIN SR) 100 MG 12 hr tablet, TAKE 1 TABLET BY MOUTH EVERY DAY, Disp: 90 tablet, Rfl: 0 .  fluticasone (FLONASE) 50 MCG/ACT nasal spray, Place 2 sprays into both nostrils daily., Disp: 48 g, Rfl: 3 .  gabapentin (NEURONTIN) 100 MG capsule, Take 1 capsule (100 mg total) by mouth 2 (two) times daily., Disp: 180 capsule, Rfl: 3 .  hydrOXYzine (ATARAX/VISTARIL) 25 MG tablet, TAKE 1 TABLET (25 MG TOTAL) BY MOUTH 2 (TWO) TIMES DAILY AS NEEDED FOR ANXIETY., Disp: 180 tablet, Rfl: 0 .  ipratropium-albuterol (DUONEB) 0.5-2.5 (3) MG/3ML SOLN, INHALE 3 MLS INTO THE LUNGS EVERY 6 (SIX) HOURS AS NEEDED., Disp: 360 mL, Rfl: 3 .  LINZESS 290 MCG CAPS capsule, Take 1 capsule (290 mcg total) by mouth daily as needed (constipation)., Disp: 30 capsule, Rfl: 3 .  Multiple Vitamins-Minerals (MULTIVITAMIN ADULT) TABS, Take 1 tablet by mouth daily., Disp: , Rfl:  .  naproxen (NAPROSYN) 375 MG tablet, Take 1 tablet (375 mg total) by mouth 2 (two) times daily as needed for moderate pain., Disp: 40 tablet, Rfl: 1 .  nicotine (NICODERM CQ) 14 mg/24hr patch, Place 1 patch (14 mg total) onto the skin daily., Disp: 28 patch, Rfl: 3 .  omeprazole (PRILOSEC) 40 MG capsule, TAKE 1 CAPSULE BY MOUTH DAILY. FOR THREE  WEEKS THEN AS NEEDED. TAKE 30 MIN PRIOR TO LARGE MEAL, Disp: 90 capsule, Rfl: 1 .  OXYGEN, Inhale 3 L/min into the lungs continuous., Disp: , Rfl:  .  polyethylene glycol powder (GLYCOLAX/MIRALAX) powder, Take 17 g by mouth daily as needed for moderate constipation., Disp: 3350 g, Rfl: 1 .  PROAIR HFA 108 (90 Base) MCG/ACT inhaler, TAKE 2 PUFFS BY MOUTH EVERY 6 HOURS AS NEEDED FOR WHEEZE OR SHORTNESS OF BREATH, Disp: 54 g, Rfl: 1 .  sertraline (ZOLOFT) 100 MG tablet, TAKE 1.5 TABLETS BY MOUTH DAILY, Disp: 45 tablet, Rfl: 0 .  SYMBICORT 160-4.5 MCG/ACT inhaler, Inhale 2 puffs into the lungs 2 (two) times daily., Disp: 3 Inhaler, Rfl: 3 .  tiotropium (SPIRIVA) 18 MCG inhalation capsule, Place 1 capsule (18 mcg total) into inhaler and inhale daily., Disp: 90 capsule, Rfl: 4 .  tiZANidine (ZANAFLEX) 2 MG tablet, TAKE 1 TABLET (2 MG TOTAL) BY MOUTH 2 (TWO) TIMES DAILY AS NEEDED FOR MUSCLE SPASMS (NECK PAIN)., Disp: 60 tablet, Rfl: 1 .  traZODone (DESYREL) 50 MG tablet, Take 1 tablet (50 mg total) by mouth at bedtime., Disp: 90 tablet, Rfl: 3 .  vitamin B-12 (CYANOCOBALAMIN) 1000 MCG tablet, Take 1,000 mcg by mouth daily., Disp: , Rfl:  .  vitamin E 400 UNIT capsule, Take 400 Units by mouth daily. , Disp: , Rfl:   Past Medical History: Past  Medical History:  Diagnosis Date  . Cervical cancer ([place])   . Chronic idiopathic constipation   . COPD (chronic obstructive pulmonary disease) ([place])   . Depression   . Endometriosis   . HLD (hyperlipidemia)     Tobacco Use: Social History   Tobacco Use  Smoking Status Current Every Day Smoker  . Packs/day: 0.50  . Years: 47.00  . Pack years: 23.50  . Types: Cigarettes  . Last attempt to quit: 07/25/2018  . Years since quitting: 0.6  Smokeless Tobacco Never Used  Tobacco Comment   9/18 down to 6-7 per day, set quit date of 10/1    Labs: Recent Review Flowsheet Data    Labs for ITP Cardiac and Pulmonary Rehab Latest Ref Rng & Units 04/04/2009  04/09/2016 02/05/2017 02/06/2018 02/11/2019   Cholestrol 0 - 200 mg/dL - - 207(H) 181 207(H)   LDLCALC 0 - 99 mg/dL - - 137(H) 105(H) 133(H)   HDL >39.00 mg/dL - - 43.60 45.30 49.40   Trlycerides 0.0 - 149.0 mg/dL - - 133.0 153.0(H) 122.0   PHART 7.350 - 7.450 7.302(L) 7.30(L) - - -   PCO2ART 32.0 - 48.0 mmHg 59.5 CRITICAL RESULT CALLED TO, READ BACK BY AND VERIFIED WITH: [facility] 04/04/09 AT 1440 CRITICAL RESULT CALLED TO, READ BACK BY AND VERIFIED WITH: [facility] 04/04/09 AT 1440 BY BROADNAX,L.RRT(HH) 65(H) - - -   HCO3 20.0 - 28.0 mmol/L 28.5(H) 32.0(H) - - -   TCO2 0 - 100 mmol/L 25.9 - - - -   O2SAT % 95.5 88.7 - - -       Pulmonary Assessment Scores:   UCSD: Self-administered rating of dyspnea associated with activities of daily living (ADLs) 6-point scale (0 = "not at all" to 5 = "maximal or unable to do because of breathlessness")  Scoring Scores range from 0 to 120.  Minimally important difference is 5 units  CAT: CAT can identify the health impairment of COPD patients and is better correlated with disease progression.  CAT has a scoring range of zero to 40. The CAT score is classified into four groups of low (less than 10), medium (10 - 20), high (21-30) and very high (31-40) based on the impact level of disease on health status. A CAT score over 10 suggests significant symptoms.  A worsening CAT score could be explained by an exacerbation, poor medication adherence, poor inhaler technique, or progression of COPD or comorbid conditions.  CAT MCID is 2 points  mMRC: mMRC (Modified Medical Research Council) Dyspnea Scale is used to assess the degree of baseline functional disability in patients of respiratory disease due to dyspnea. No minimal important difference is established. A decrease in score of 1 point or greater is considered a positive change.   Pulmonary Function Assessment:   Exercise Target Goals: Exercise Program Goal: Individual exercise prescription set  using results from initial 6 min walk test and THRR while considering  patient's activity barriers and safety.   Exercise Prescription Goal: Initial exercise prescription builds to 30-45 minutes a day of aerobic activity, 2-3 days per week.  Home exercise guidelines will be given to patient during program as part of exercise prescription that the participant will acknowledge.  Activity Barriers & Risk Stratification:   6 Minute Walk:  Oxygen Initial Assessment:   Oxygen Re-Evaluation: Oxygen Re-Evaluation    Row Name 11/04/18 1029 12/02/18 1210 03/05/19 1142         Program Oxygen Prescription   Program Oxygen Prescription  Continuous;E-Tanks  Continuous;E-Tanks  Continuous;E-Tanks     Liters per minute  '3  3  3       '$ Home Oxygen   Home Oxygen Device  Home Concentrator;Portable Concentrator;E-Tanks  Home Concentrator;Portable Concentrator;E-Tanks  Home Concentrator;Portable Concentrator;E-Tanks     Sleep Oxygen Prescription  Continuous  Continuous  Continuous     Liters per minute  -  3  3     Home Exercise Oxygen Prescription  Pulsed  Pulsed  Pulsed     Liters per minute  '3  3  3     '$ Home at Rest Exercise Oxygen Prescription  Continuous  Continuous  Continuous     Liters per minute  3  -  3     Compliance with Home Oxygen Use  Yes  Yes  Yes       Goals/Expected Outcomes   Short Term Goals  To learn and exhibit compliance with exercise, home and travel O2 prescription;To learn and understand importance of monitoring SPO2 with pulse oximeter and demonstrate accurate use of the pulse oximeter.;To learn and understand importance of maintaining oxygen saturations>88%;To learn and demonstrate proper pursed lip breathing techniques or other breathing techniques.;To learn and demonstrate proper use of respiratory medications  To learn and exhibit compliance with exercise, home and travel O2 prescription;To learn and understand importance of monitoring SPO2 with pulse oximeter and  demonstrate accurate use of the pulse oximeter.;To learn and understand importance of maintaining oxygen saturations>88%;To learn and demonstrate proper pursed lip breathing techniques or other breathing techniques.;To learn and demonstrate proper use of respiratory medications  To learn and exhibit compliance with exercise, home and travel O2 prescription;To learn and understand importance of monitoring SPO2 with pulse oximeter and demonstrate accurate use of the pulse oximeter.;To learn and understand importance of maintaining oxygen saturations>88%;To learn and demonstrate proper pursed lip breathing techniques or other breathing techniques.;To learn and demonstrate proper use of respiratory medications     Long  Term Goals  Exhibits compliance with exercise, home and travel O2 prescription;Verbalizes importance of monitoring SPO2 with pulse oximeter and return demonstration;Maintenance of O2 saturations>88%;Exhibits proper breathing techniques, such as pursed lip breathing or other method taught during program session;Compliance with respiratory medication;Demonstrates proper use of MDI's  Exhibits compliance with exercise, home and travel O2 prescription;Verbalizes importance of monitoring SPO2 with pulse oximeter and return demonstration;Maintenance of O2 saturations>88%;Exhibits proper breathing techniques, such as pursed lip breathing or other method taught during program session;Compliance with respiratory medication;Demonstrates proper use of MDI's  Exhibits compliance with exercise, home and travel O2 prescription;Verbalizes importance of monitoring SPO2 with pulse oximeter and return demonstration;Maintenance of O2 saturations>88%;Exhibits proper breathing techniques, such as pursed lip breathing or other method taught during program session;Compliance with respiratory medication;Demonstrates proper use of MDI's     Comments  Christine Serrano continues to stay compliant with her oxygen and numbers have been  looking good.  She continues to use her PLB and her breathing is doing better overall.  She has been working outside more too. She mentioned that yesterday she started having a runny nose and coughing again. She was encouraged to contact her doctor about it today!  Christine Serrano is compliant with her oxygen and reports no issues. Her SOB has improved since increasing activity slowly. Cough from previous documentation has improved.  Christine Serrano has continued to be compliant with her oxygen.  Her breathing is improving.  She continues to use her PLB to help.  Coughing continues to improve.     Goals/Expected Outcomes  Short:  Contact doctor about cough.  Long: Continued compliance with oxygen.   Short:continue daily medications Long: maintain independence with oxygen management  Short:continue daily medications Long: maintain independence with oxygen management        Oxygen Discharge (Final Oxygen Re-Evaluation): Oxygen Re-Evaluation - 03/05/19 1142      Program Oxygen Prescription   Program Oxygen Prescription  Continuous;E-Tanks    Liters per minute  3      Home Oxygen   Home Oxygen Device  Home Concentrator;Portable Concentrator;E-Tanks    Sleep Oxygen Prescription  Continuous    Liters per minute  3    Home Exercise Oxygen Prescription  Pulsed    Liters per minute  3    Home at Rest Exercise Oxygen Prescription  Continuous    Liters per minute  3    Compliance with Home Oxygen Use  Yes      Goals/Expected Outcomes   Short Term Goals  To learn and exhibit compliance with exercise, home and travel O2 prescription;To learn and understand importance of monitoring SPO2 with pulse oximeter and demonstrate accurate use of the pulse oximeter.;To learn and understand importance of maintaining oxygen saturations>88%;To learn and demonstrate proper pursed lip breathing techniques or other breathing techniques.;To learn and demonstrate proper use of respiratory medications    Long  Term Goals  Exhibits compliance  with exercise, home and travel O2 prescription;Verbalizes importance of monitoring SPO2 with pulse oximeter and return demonstration;Maintenance of O2 saturations>88%;Exhibits proper breathing techniques, such as pursed lip breathing or other method taught during program session;Compliance with respiratory medication;Demonstrates proper use of MDI's    Comments  Christine Serrano has continued to be compliant with her oxygen.  Her breathing is improving.  She continues to use her PLB to help.  Coughing continues to improve.    Goals/Expected Outcomes  Short:continue daily medications Long: maintain independence with oxygen management       Initial Exercise Prescription:   Perform Capillary Blood Glucose checks as needed.  Exercise Prescription Changes: Exercise Prescription Changes    Row Name 02/05/19 1000 02/26/19 1200 03/09/19 1600         Response to Exercise   Blood Pressure (Admit)  116/70  118/62  124/64     Blood Pressure (Exercise)  124/68  128/58  144/64     Blood Pressure (Exit)  130/64  110/58  124/56     Heart Rate (Admit)  110 bpm  100 bpm  106 bpm     Heart Rate (Exercise)  140 bpm  142 bpm  136 bpm     Heart Rate (Exit)  135 bpm  127 bpm  132 bpm     Oxygen Saturation (Admit)  98 %  98 %  96 %     Oxygen Saturation (Exercise)  97 %  92 %  96 %     Oxygen Saturation (Exit)  97 %  94 %  98 %     Rating of Perceived Exertion (Exercise)  '17  17  12     '$ Perceived Dyspnea (Exercise)  '4  4  1     '$ Symptoms  -  -  none     Duration  Continue with 30 min of aerobic exercise without signs/symptoms of physical distress.  Continue with 30 min of aerobic exercise without signs/symptoms of physical distress.  Continue with 30 min of aerobic exercise without signs/symptoms of physical distress.     Intensity  THRR unchanged  THRR unchanged  THRR unchanged  Progression   Progression  Continue to progress workloads to maintain intensity without signs/symptoms of physical distress.  Continue  to progress workloads to maintain intensity without signs/symptoms of physical distress.  Continue to progress workloads to maintain intensity without signs/symptoms of physical distress.     Average METs  3.3  2.1  2.7       Resistance Training   Training Prescription  Yes  Yes  Yes     Weight  3 lb  3 lb  3 lbs     Reps  10-15  10-15  10-15       Interval Training   Interval Training  No  No  No       Treadmill   MPH  -  1.2  1.7     Grade  -  0  0     Minutes  -  15  15     METs  -  1.92  2.3       Recumbant Bike   Level  -  -  1     Watts  -  -  15     Minutes  -  -  15       NuStep   Level  1  -  -     SPM  80  -  -     Minutes  15  -  -     METs  2.4  -  -       REL-XR   Level  1  -  1     Speed  50  -  -     Minutes  15  -  15     METs  4.4  -  3.7       T5 Nustep   Level  -  1  3     SPM  -  80  -     Minutes  -  15  15     METs  -  2.3  2.1       Home Exercise Plan   Plans to continue exercise at  Home (comment) walking  Home (comment) walking  Home (comment) walking     Frequency  Add 1 additional day to program exercise sessions.  Add 1 additional day to program exercise sessions.  Add 1 additional day to program exercise sessions.     Initial Home Exercises Provided  07/29/18  07/29/18  07/29/18        Exercise Comments:   Exercise Goals and Review:   Exercise Goals Re-Evaluation : Exercise Goals Re-Evaluation    Row Name 11/04/18 1021 12/02/18 1152 02/05/19 1018 02/26/19 1258 03/05/19 1136     Exercise Goal Re-Evaluation   Exercise Goals Review  Increase Physical Activity;Increase Strength and Stamina;Understanding of Exercise Prescription  Increase Physical Activity;Increase Strength and Stamina;Understanding of Exercise Prescription  Increase Physical Activity;Increase Strength and Stamina;Able to understand and use rate of perceived exertion (RPE) scale;Able to understand and use Dyspnea scale;Knowledge and understanding of Target Heart Rate  Range (THRR);Able to check pulse independently;Understanding of Exercise Prescription  Increase Physical Activity;Increase Strength and Stamina;Able to understand and use rate of perceived exertion (RPE) scale;Able to understand and use Dyspnea scale;Knowledge and understanding of Target Heart Rate Range (THRR);Able to check pulse independently;Understanding of Exercise Prescription  Increase Physical Activity;Increase Strength and Stamina;Understanding of Exercise Prescription   Comments  Christine Serrano has been doing well at home.  She has been  getting in more walking and now also working out in the yard.  She does her weights each day.  She has not been getting our emails as her phone had died, recent the videos to her.  Christine Serrano has been feeling great these past few weeks! She has tapered off her depression meds (per MD supervision) and has been feeling so much more active. Her horse has been keeping her busy, she's been weeding around her bushes, and canning fruits and vegetables. All these activities have boosted her spirit and therefore encouraged her to be more active  Christine Serrano is just returning to in person LW classes.  She reports RPE of 14-17.  Regular attendance will improver her stamina.  Christine Serrano has not been able to attend consistently.  Staff will monitor progress  Christine Serrano is doing better in rehab now that she is coming later in the day.  She was able to ride her horse last week.  She is doing some walking at home on her off days.   Expected Outcomes  Short: Try out our videos.  Long: Continue to rebuild stamina.   Short: watch the videos, continue increasing activity. Long: build stamina to keep up with daily activities/chores  Short - attend consistently Long - increase overall stamina and MET level  Short - attend consistently Long - increase overall stamina and MET level  Short: [place] to attend consistently  Long: Continue to build stamina.      Discharge Exercise Prescription (Final Exercise Prescription  Changes): Exercise Prescription Changes - 03/09/19 1600      Response to Exercise   Blood Pressure (Admit)  124/64    Blood Pressure (Exercise)  144/64    Blood Pressure (Exit)  124/56    Heart Rate (Admit)  106 bpm    Heart Rate (Exercise)  136 bpm    Heart Rate (Exit)  132 bpm    Oxygen Saturation (Admit)  96 %    Oxygen Saturation (Exercise)  96 %    Oxygen Saturation (Exit)  98 %    Rating of Perceived Exertion (Exercise)  12    Perceived Dyspnea (Exercise)  1    Symptoms  none    Duration  Continue with 30 min of aerobic exercise without signs/symptoms of physical distress.    Intensity  THRR unchanged      Progression   Progression  Continue to progress workloads to maintain intensity without signs/symptoms of physical distress.    Average METs  2.7      Resistance Training   Training Prescription  Yes    Weight  3 lbs    Reps  10-15      Interval Training   Interval Training  No      Treadmill   MPH  1.7    Grade  0    Minutes  15    METs  2.3      Recumbant Bike   Level  1    Watts  15    Minutes  15      REL-XR   Level  1    Minutes  15    METs  3.7      T5 Nustep   Level  3    Minutes  15    METs  2.1      Home Exercise Plan   Plans to continue exercise at  Home (comment)   walking   Frequency  Add 1 additional day to program exercise sessions.  Initial Home Exercises Provided  07/29/18       Nutrition:  Target Goals: Understanding of nutrition guidelines, daily intake of sodium '1500mg'$ , cholesterol '200mg'$ , calories 30% from fat and 7% or less from saturated fats, daily to have 5 or more servings of fruits and vegetables.  Biometrics:    Nutrition Therapy Plan and Nutrition Goals: Nutrition Therapy & Goals - 10/27/18 1336      Nutrition Therapy   Diet  heart healthy, low sodium diet (right now focus on increasing kcal and pro)    Protein (specify units)  40-50g    Fiber  25 grams    Whole Grain Foods  3 servings    Saturated Fats   12 max. grams    Fruits and Vegetables  5 servings/day    Sodium  1.5 grams      Personal Nutrition Goals   Nutrition Goal  ST: increase calories and protein eaten daily LT: gain appetite back and maintain weight    Comments  Appetite is good, but depressed and anxious so will not eat. is not eating as much as usual. Pt reports lost about 10 pounds, went down pant sizes (unsure how much). has less energy and it is harder to do things.  soemtimes wont eat at all; usually  eats once a day (about a fistfull - a sandwich) - since the fall.  discussed ways to increase intake to meet calorie and protein needs, told pt to eat small frequent meals making sure to eat protein foods first, add some healthy fat to food, etc. Pt does not like guacaomole but suggested soemthing like a bean dip/hummus, yogurt, or peanut butter to add protein and fat. Pt reports eating raw veggies and ranch as a snack.  Discussed MyPlate. Suggested she talk to her psychologist again for her depression and anxiety - they may have telehealth available.       Intervention Plan   Intervention  Prescribe, educate and counsel regarding individualized specific dietary modifications aiming towards targeted core components such as weight, hypertension, lipid management, diabetes, heart failure and other comorbidities.    Expected Outcomes  Short Term Goal: Understand basic principles of dietary content, such as calories, fat, sodium, cholesterol and nutrients.;Short Term Goal: A plan has been developed with personal nutrition goals set during dietitian appointment.;Long Term Goal: Adherence to prescribed nutrition plan.       Nutrition Assessments:   Nutrition Goals Re-Evaluation:   Nutrition Goals Discharge (Final Nutrition Goals Re-Evaluation):   Psychosocial: Target Goals: Acknowledge presence or absence of significant depression and/or stress, maximize coping skills, provide positive support system. Participant is able to  verbalize types and ability to use techniques and skills needed for reducing stress and depression.   Initial Review & Psychosocial Screening:   Quality of Life Scores:  Scores of 19 and below usually indicate a poorer quality of life in these areas.  A difference of  2-3 points is a clinically meaningful difference.  A difference of 2-3 points in the total score of the Quality of Life Index has been associated with significant improvement in overall quality of life, self-image, physical symptoms, and general health in studies assessing change in quality of life.  PHQ-9: Recent Review Flowsheet Data    Depression screen [facility] 2/9 02/25/2019 08/13/2018 07/20/2018 03/09/2018 02/06/2018   Decreased Interest '1 2 3 2 1   '$ Down, Depressed, Hopeless '1 2 3 3 3   '$ PHQ - 2 Score '2 4 6 5 4   '$ Altered  sleeping '2 3 3 3 1   '$ Tired, decreased energy '3 2 3 3 3   '$ Change in appetite '2 1 3 2 1   '$ Feeling bad or failure about yourself  '1 3 2 1 1   '$ Trouble concentrating 0 '1 3 1 '$ 0   Moving slowly or fidgety/restless 0 0 0 1 0   Suicidal thoughts 0 0 0 0 0   PHQ-9 Score '10 14 20 16 10   '$ Difficult doing work/chores - - Very difficult Extremely dIfficult Very difficult     Interpretation of Total Score  Total Score Depression Severity:  1-4 = Minimal depression, 5-9 = Mild depression, 10-14 = Moderate depression, 15-19 = Moderately severe depression, 20-27 = Severe depression   Psychosocial Evaluation and Intervention:   Psychosocial Re-Evaluation: Psychosocial Re-Evaluation    [place] Name 11/04/18 1022 12/02/18 1156 03/05/19 1137         Psychosocial Re-Evaluation   Current issues with  Current Depression;Current Stress Concerns  Current Depression;Current Stress Concerns  Current Depression;Current Stress Concerns     Comments  Christine Serrano has been feeling better.  She is still working on her smoking.  She has been getting outside more and working in yard.  She is excited about the arrival of a little of puppies soon.   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  This has all lifted her spirits!!  She does need to get the welping box built.  She has also been able to work with her horse some too.   Christine Serrano has tapered off her depression meds for 3 weeks now (under MD supervision) and has been feeling great. She thought it was her sickness making her feel so weak, but once she has come off these meds she feels so much "healthier" and is very prou of herself for doing more around the house  Christine Serrano is doing well mentally.  She is back on her meds and denies symptoms of depression.  She has set a quit date for smoking of October 1st!!  She is ready.     Expected Outcomes  Short: Enjoy the puppies  Long: Continue to stay positive!  Short: prepare for the puppies in September, accomplih chores outside (weeding, harvesting fruits for [employer], etc), celebrate her 58th birthday 7[birth year].. Long:continue to work on self care and enjoy her animals.  Short: Quit smoking.  Long: Continue to practice self care.     Interventions  Encouraged to attend Pulmonary Rehabilitation for the exercise  Encouraged to attend Pulmonary Rehabilitation for the exercise  Encouraged to attend Pulmonary Rehabilitation for the exercise     Continue Psychosocial Services   Follow up required by staff  Follow up required by staff  Follow up required by staff        Psychosocial Discharge (Final Psychosocial Re-Evaluation): Psychosocial Re-Evaluation - 03/05/19 1137      Psychosocial Re-Evaluation   Current issues with  Current Depression;Current Stress Concerns    Comments  Christine Serrano is doing well mentally.  She is back on her meds and denies symptoms of depression.  She has set a quit date for smoking of October 1st!!  She is ready.    Expected Outcomes  Short: Quit smoking.  Long: Continue to practice self care.    Interventions  Encouraged to attend Pulmonary Rehabilitation for the exercise    Continue Psychosocial Services   Follow up  required by staff       Education: Education Goals:  Education classes will be provided on a weekly basis, covering required topics. Participant will state understanding/return demonstration of topics presented.  Learning Barriers/Preferences:   Education Topics:  Initial Evaluation Education: - Verbal, written and demonstration of respiratory meds, oximetry and breathing techniques. Instruction on use of nebulizers and MDIs and importance of monitoring MDI activations.   Pulmonary Rehab from 08/05/2018 in [facility] Cardiac and Pulmonary Rehab  Date  07/20/18  Educator  [facility]  Instruction Review Code  1- Verbalizes Understanding      General Nutrition Guidelines/Fats and Fiber: -Group instruction provided by verbal, written material, models and posters to present the general guidelines for heart healthy nutrition. Gives an explanation and review of dietary fats and fiber.   Pulmonary Rehab from 08/05/2018 in [facility] Cardiac and Pulmonary Rehab  Date  07/29/18  Educator  [facility]  Instruction Review Code  1- Verbalizes Understanding      Controlling Sodium/Reading Food Labels: -Group verbal and written material supporting the discussion of sodium use in heart healthy nutrition. Review and explanation with models, verbal and written materials for utilization of the food label.   Exercise Physiology & General Exercise Guidelines: - Group verbal and written instruction with models to review the exercise physiology of the cardiovascular system and associated critical values. Provides general exercise guidelines with specific guidelines to those with heart or lung disease.    Aerobic Exercise & Resistance Training: - Gives group verbal and written instruction on the various components of exercise. Focuses on aerobic and resistive training programs and the benefits of this training and how to safely progress through these programs.   Flexibility, Balance, Mind/Body Relaxation: Provides group  verbal/written instruction on the benefits of flexibility and balance training, including mind/body exercise modes such as yoga, pilates and tai chi.  Demonstration and skill practice provided.   Stress and Anxiety: - Provides group verbal and written instruction about the health risks of elevated stress and causes of high stress.  Discuss the correlation between heart/lung disease and anxiety and treatment options. Review healthy ways to manage with stress and anxiety.   Pulmonary Rehab from 08/05/2018 in [facility] Cardiac and Pulmonary Rehab  Date  07/22/18  Educator  [facility]  Instruction Review Code  1- Verbalizes Understanding      Depression: - Provides group verbal and written instruction on the correlation between heart/lung disease and depressed mood, treatment options, and the stigmas associated with seeking treatment.   Exercise & Equipment Safety: - Individual verbal instruction and demonstration of equipment use and safety with use of the equipment.   Pulmonary Rehab from 08/05/2018 in [facility] Cardiac and Pulmonary Rehab  Date  07/20/18  Educator  [facility]  Instruction Review Code  1- Verbalizes Understanding      Infection Prevention: - Provides verbal and written material to individual with discussion of infection control including proper hand washing and proper equipment cleaning during exercise session.   Pulmonary Rehab from 08/05/2018 in [facility] Cardiac and Pulmonary Rehab  Date  07/20/18  Educator  [facility]  Instruction Review Code  1- Verbalizes Understanding      Falls Prevention: - Provides verbal and written material to individual with discussion of falls prevention and safety.   Pulmonary Rehab from 08/05/2018 in [facility] Cardiac and Pulmonary Rehab  Date  07/20/18  Educator  [facility]  Instruction Review Code  1- Verbalizes Understanding      Diabetes: - Individual verbal and written instruction to review signs/symptoms of diabetes, desired ranges of glucose level fasting, after meals and  with exercise. Advice that pre and post exercise glucose checks will be done for 3 sessions at entry of program.   Chronic Lung Diseases: - Group verbal and written instruction to review updates, respiratory medications, advancements in procedures and treatments. Discuss use of supplemental oxygen including available portable oxygen systems, continuous and intermittent flow rates, concentrators, personal use and safety guidelines. Review proper use of inhaler and spacers. Provide informative websites for self-education.    Energy Conservation: - Provide group verbal and written instruction for methods to conserve energy, plan and organize activities. Instruct on pacing techniques, use of adaptive equipment and posture/positioning to relieve shortness of breath.   Triggers and Exacerbations: - Group verbal and written instruction to review types of environmental triggers and ways to prevent exacerbations. Discuss weather changes, air quality and the benefits of nasal washing. Review warning signs and symptoms to help prevent infections. Discuss techniques for effective airway clearance, coughing, and vibrations.   Pulmonary Rehab from 08/07/2016 in [facility] Cardiac and Pulmonary Rehab  Date  08/07/16  Educator  [facility]  Instruction Review Code (retired)  2- meets goals/outcomes      AED/CPR: - Group verbal and written instruction with the use of models to demonstrate the basic use of the AED with the basic ABC's of resuscitation.   Anatomy and Physiology of the Lungs: - Group verbal and written instruction with the use of models to provide basic lung anatomy and physiology related to function, structure and complications of lung disease.   Anatomy & Physiology of the Heart: - Group verbal and written instruction and models provide basic cardiac anatomy and physiology, with the coronary electrical and arterial systems. Review of Valvular disease and Heart Failure   Cardiac Medications: - Group  verbal and written instruction to review commonly prescribed medications for heart disease. Reviews the medication, class of the drug, and side effects.   Know Your Numbers and Risk Factors: -Group verbal and written instruction about important numbers in your health.  Discussion of what are risk factors and how they play a role in the disease process.  Review of Cholesterol, Blood Pressure, Diabetes, and BMI and the role they play in your overall health.   Sleep Hygiene: -Provides group verbal and written instruction about how sleep can affect your health.  Define sleep hygiene, discuss sleep cycles and impact of sleep habits. Review good sleep hygiene tips.    Other: -Provides group and verbal instruction on various topics (see comments)    Knowledge Questionnaire Score:    Core Components/Risk Factors/Patient Goals at Admission:   Core Components/Risk Factors/Patient Goals Review:  Goals and Risk Factor Review    Row Name 11/04/18 1027 12/02/18 1207 03/05/19 1138         Core Components/Risk Factors/Patient Goals Review   Personal Goals Review  Weight Management/Obesity;Improve shortness of breath with ADL's;Tobacco Cessation  Weight Management/Obesity;Improve shortness of breath with ADL's;Tobacco Cessation  Weight Management/Obesity;Improve shortness of breath with ADL's;Tobacco Cessation     Review  Christine Serrano has been doing well at home. Her weight continues to stay steady.  She is still smoking but not increasing.  Her breathing has gotten better overall.    Christine Serrano has been maintaining her weight, decreasing her SOB by increasing stamina and time being active, and not increasing smoking  Christine Serrano has set a quit date of October 1st!!  She has had a few days without.  Her weight is coming back up now that her mood is better.  Her breathing is improving.  Expected Outcomes  Short: Continue to work on smoking  Long: Continue to monitor risk factors.   Short: continue working on SOB,  Long: decrease smoking  Short: Quit smoking!  Long: Remain smoke free.        Core Components/Risk Factors/Patient Goals at Discharge (Final Review):  Goals and Risk Factor Review - 03/05/19 1138      Core Components/Risk Factors/Patient Goals Review   Personal Goals Review  Weight Management/Obesity;Improve shortness of breath with ADL's;Tobacco Cessation    Review  Christine Serrano has set a quit date of October 1st!!  She has had a few days without.  Her weight is coming back up now that her mood is better.  Her breathing is improving.    Expected Outcomes  Short: Quit smoking!  Long: Remain smoke free.       ITP Comments: ITP Comments    Row Name 01/13/19 1025 01/27/19 1004 02/23/19 0908 02/24/19 0558 03/01/19 1200   ITP Comments  Scheduled to return on 8/3.  Exctied to come back.  REturned to Pulmonary Rehab after we were closed for COVID 19.  ITP sent to Dr Sabra Heck for review, changes as needed and signature.  Unable to complete nutrition 30-day re-eval cycle due to inconsistent attendence  30 Day review. Continue with ITP unless directed changes per Medical Director review.  8/19 last visit  Called to check to on patient.  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  She is also just not able to get up and get moving in the morning.  She request to change class times to 1115.   [place] Name 03/15/19 1429           ITP Comments  Christine Serrano called out on Friday with low O2 saturations.  Today she is feeling better but slept til 1045.   She is hoping to attend on Wednesday.          Comments: 30 day review

## 2019-03-26 ENCOUNTER — Ambulatory Visit: Payer: Medicare Other

## 2019-03-29 ENCOUNTER — Ambulatory Visit: Payer: Medicare Other

## 2019-03-31 ENCOUNTER — Ambulatory Visit: Payer: Medicare Other

## 2019-04-01 ENCOUNTER — Other Ambulatory Visit: Payer: Self-pay

## 2019-04-01 ENCOUNTER — Ambulatory Visit (INDEPENDENT_AMBULATORY_CARE_PROVIDER_SITE_OTHER): Payer: Medicare Other

## 2019-04-01 DIAGNOSIS — R0989 Other specified symptoms and signs involving the circulatory and respiratory systems: Secondary | ICD-10-CM | POA: Diagnosis not present

## 2019-04-02 ENCOUNTER — Ambulatory Visit: Payer: Medicare Other

## 2019-04-05 ENCOUNTER — Ambulatory Visit: Payer: Medicare Other

## 2019-04-06 ENCOUNTER — Encounter: Payer: Self-pay | Admitting: *Deleted

## 2019-04-06 ENCOUNTER — Encounter: Payer: Self-pay | Admitting: Family Medicine

## 2019-04-06 ENCOUNTER — Telehealth: Payer: Self-pay | Admitting: *Deleted

## 2019-04-06 DIAGNOSIS — J449 Chronic obstructive pulmonary disease, unspecified: Secondary | ICD-10-CM

## 2019-04-06 DIAGNOSIS — I6522 Occlusion and stenosis of left carotid artery: Secondary | ICD-10-CM | POA: Insufficient documentation

## 2019-04-06 NOTE — Telephone Encounter (Signed)
Called to check on pt.  She has been out since 9/18.  Left message.

## 2019-04-07 ENCOUNTER — Ambulatory Visit: Payer: Medicare Other

## 2019-04-08 ENCOUNTER — Other Ambulatory Visit (INDEPENDENT_AMBULATORY_CARE_PROVIDER_SITE_OTHER): Payer: Medicare Other

## 2019-04-08 ENCOUNTER — Telehealth: Payer: Self-pay

## 2019-04-08 DIAGNOSIS — E673 Hypervitaminosis D: Secondary | ICD-10-CM

## 2019-04-08 LAB — VITAMIN D 25 HYDROXY (VIT D DEFICIENCY, FRACTURES): VITD: 106 ng/mL (ref 30.00–100.00)

## 2019-04-08 NOTE — Telephone Encounter (Signed)
[facility] called @ 1608 to report a critical result of  Vit D [phone]

## 2019-04-09 ENCOUNTER — Ambulatory Visit: Payer: Medicare Other

## 2019-04-09 NOTE — Telephone Encounter (Signed)
See result note.  

## 2019-04-09 NOTE — Telephone Encounter (Signed)
This is improved from previous.  We will route to PCP for management.

## 2019-04-12 ENCOUNTER — Ambulatory Visit: Payer: Medicare Other

## 2019-04-14 ENCOUNTER — Encounter: Payer: Self-pay | Admitting: *Deleted

## 2019-04-14 ENCOUNTER — Ambulatory Visit: Payer: Medicare Other

## 2019-04-14 ENCOUNTER — Telehealth: Payer: Self-pay | Admitting: *Deleted

## 2019-04-14 DIAGNOSIS — J449 Chronic obstructive pulmonary disease, unspecified: Secondary | ICD-10-CM

## 2019-04-14 NOTE — Telephone Encounter (Signed)
Called to check on pt.  Left message.  Explained that we will need her to return our contact by the end of this week or we will discharge her from the program.

## 2019-04-16 ENCOUNTER — Ambulatory Visit: Payer: Medicare Other

## 2019-04-19 ENCOUNTER — Ambulatory Visit: Payer: Medicare Other

## 2019-04-19 ENCOUNTER — Encounter: Payer: Self-pay | Admitting: *Deleted

## 2019-04-19 DIAGNOSIS — J449 Chronic obstructive pulmonary disease, unspecified: Secondary | ICD-10-CM

## 2019-04-19 NOTE — Progress Notes (Signed)
Discharge Progress Report  Patient Details  Name: Christine Serrano MRN: [id number] Date of Birth: 1960/09/02 Referring Provider:     Pulmonary Rehab from 07/20/2018 in [facility] Cardiac and Pulmonary Rehab  Referring Provider  Louretta Parma MD       Number of Visits: 11   Reason for Discharge:  Early Exit:  Lack of attendance  Smoking History:  Social History   Tobacco Use  Smoking Status Current Every Day Smoker  . Packs/day: 0.50  . Years: 47.00  . Pack years: 23.50  . Types: Cigarettes  . Last attempt to quit: 07/25/2018  . Years since quitting: 0.7  Smokeless Tobacco Never Used  Tobacco Comment   9/18 down to 6-7 per day, set quit date of 10/1    Diagnosis:  COPD, severe ([place])  ADL UCSD:   Initial Exercise Prescription:   Discharge Exercise Prescription (Final Exercise Prescription Changes): Exercise Prescription Changes - 03/09/19 1600      Response to Exercise   Blood Pressure (Admit)  124/64    Blood Pressure (Exercise)  144/64    Blood Pressure (Exit)  124/56    Heart Rate (Admit)  106 bpm    Heart Rate (Exercise)  136 bpm    Heart Rate (Exit)  132 bpm    Oxygen Saturation (Admit)  96 %    Oxygen Saturation (Exercise)  96 %    Oxygen Saturation (Exit)  98 %    Rating of Perceived Exertion (Exercise)  12    Perceived Dyspnea (Exercise)  1    Symptoms  none    Duration  Continue with 30 min of aerobic exercise without signs/symptoms of physical distress.    Intensity  THRR unchanged      Progression   Progression  Continue to progress workloads to maintain intensity without signs/symptoms of physical distress.    Average METs  2.7      Resistance Training   Training Prescription  Yes    Weight  3 lbs    Reps  10-15      Interval Training   Interval Training  No      Treadmill   MPH  1.7    Grade  0    Minutes  15    METs  2.3      Recumbant Bike   Level  1    Watts  15    Minutes  15      REL-XR   Level  1    Minutes  15    METs  3.7       T5 Nustep   Level  3    Minutes  15    METs  2.1      Home Exercise Plan   Plans to continue exercise at  Home (comment)   walking   Frequency  Add 1 additional day to program exercise sessions.    Initial Home Exercises Provided  07/29/18       Functional Capacity:   Psychological, QOL, Others - Outcomes: PHQ 2/9: Depression screen [facility] 2/9 02/25/2019 08/13/2018 07/20/2018 03/09/2018 02/06/2018  Decreased Interest _0 Down, Depressed, Hopeless _1 PHQ - 2 Score _2 Altered sleeping _3 Tired, decreased energy _4 Change in appetite _5 Feeling bad or failure about yourself  _6 Trouble concentrating  0 _0 0  Moving slowly or fidgety/restless 0 0 0 1 0  Suicidal thoughts 0 0 0 0 0  PHQ-9 Score _1 Difficult doing work/chores - - Very difficult Extremely dIfficult Very difficult  Some recent data might be hidden    Quality of Life:   Personal Goals: Goals established at orientation with interventions provided to work toward goal.    Personal Goals Discharge: Goals and Risk Factor Review    Row Name 11/04/18 1027 12/02/18 1207 03/05/19 1138         Core Components/Risk Factors/Patient Goals Review   Personal Goals Review  Weight Management/Obesity;Improve shortness of breath with ADL's;Tobacco Cessation  Weight Management/Obesity;Improve shortness of breath with ADL's;Tobacco Cessation  Weight Management/Obesity;Improve shortness of breath with ADL's;Tobacco Cessation     Review  Neoma has been doing well at home. Her weight continues to stay steady.  She is still smoking but not increasing.  Her breathing has gotten better overall.    Phyllis has been maintaining her weight, decreasing her SOB by increasing stamina and time being active, and not increasing smoking  Nanea has set a quit date of October 1st!!  She has had a few days without.  Her weight is coming back up now that her mood is better.  Her  breathing is improving.     Expected Outcomes  Short: Continue to work on smoking  Long: Continue to monitor risk factors.   Short: continue working on SOB, Long: decrease smoking  Short: Quit smoking!  Long: Remain smoke free.        Exercise Goals and Review:   Exercise Goals Re-Evaluation: Exercise Goals Re-Evaluation    Row Name 11/04/18 1021 12/02/18 1152 02/05/19 1018 02/26/19 1258 03/05/19 1136     Exercise Goal Re-Evaluation   Exercise Goals Review  Increase Physical Activity;Increase Strength and Stamina;Understanding of Exercise Prescription  Increase Physical Activity;Increase Strength and Stamina;Understanding of Exercise Prescription  Increase Physical Activity;Increase Strength and Stamina;Able to understand and use rate of perceived exertion (RPE) scale;Able to understand and use Dyspnea scale;Knowledge and understanding of Target Heart Rate Range (THRR);Able to check pulse independently;Understanding of Exercise Prescription  Increase Physical Activity;Increase Strength and Stamina;Able to understand and use rate of perceived exertion (RPE) scale;Able to understand and use Dyspnea scale;Knowledge and understanding of Target Heart Rate Range (THRR);Able to check pulse independently;Understanding of Exercise Prescription  Increase Physical Activity;Increase Strength and Stamina;Understanding of Exercise Prescription   Comments  Vielka has been doing well at home.  She has been getting in more walking and now also working out in the yard.  She does her weights each day.  She has not been getting our emails as her phone had died, recent the videos to her.  Bridney has been feeling great these past few weeks! She has tapered off her depression meds (per MD supervision) and has been feeling so much more active. Her horse has been keeping her busy, she's been weeding around her bushes, and canning fruits and vegetables. All these activities have boosted her spirit and therefore encouraged her to  be more active  Roylene is just returning to in person LW classes.  She reports RPE of 14-17.  Regular attendance will improver her stamina.  Zeppelin has not been able to attend consistently.  Staff will monitor progress  Terrionna is doing better in rehab now that she is coming later in the day.  She was able to ride her horse last week.  She is doing some walking at home on her off days.   Expected Outcomes  Short: Try out our videos.  Long: Continue to rebuild stamina.   Short: watch the videos, continue increasing activity. Long: build stamina to keep up with daily activities/chores  Short - attend consistently Long - increase overall stamina and MET level  Short - attend consistently Long - increase overall stamina and MET level  Short: [place] to attend consistently  Long: Continue to build stamina.   [place] Name 03/30/19 1540             Exercise Goal Re-Evaluation   Comments  Out since last review.  Last day was 9/18.          Nutrition & Weight - Outcomes:    Nutrition: Nutrition Therapy & Goals - 10/27/18 1336      Nutrition Therapy   Diet  heart healthy, low sodium diet (right now focus on increasing kcal and pro)    Protein (specify units)  40-50g    Fiber  25 grams    Whole Grain Foods  3 servings    Saturated Fats  12 max. grams    Fruits and Vegetables  5 servings/day    Sodium  1.5 grams      Personal Nutrition Goals   Nutrition Goal  ST: increase calories and protein eaten daily LT: gain appetite back and maintain weight    Comments  Appetite is good, but depressed and anxious so will not eat. is not eating as much as usual. Pt reports lost about 10 pounds, went down pant sizes (unsure how much). has less energy and it is harder to do things.  soemtimes wont eat at all; usually  eats once a day (about a fistfull - a sandwich) - since the fall.  discussed ways to increase intake to meet calorie and protein needs, told pt to eat small frequent meals making sure to eat protein foods  first, add some healthy fat to food, etc. Pt does not like guacaomole but suggested soemthing like a bean dip/hummus, yogurt, or peanut butter to add protein and fat. Pt reports eating raw veggies and ranch as a snack.  Discussed MyPlate. Suggested she talk to her psychologist again for her depression and anxiety - they may have telehealth available.       Intervention Plan   Intervention  Prescribe, educate and counsel regarding individualized specific dietary modifications aiming towards targeted core components such as weight, hypertension, lipid management, diabetes, heart failure and other comorbidities.    Expected Outcomes  Short Term Goal: Understand basic principles of dietary content, such as calories, fat, sodium, cholesterol and nutrients.;Short Term Goal: A plan has been developed with personal nutrition goals set during dietitian appointment.;Long Term Goal: Adherence to prescribed nutrition plan.       Nutrition Discharge:   Education Questionnaire Score:   Goals reviewed with patient; copy given to patient.

## 2019-04-19 NOTE — Progress Notes (Signed)
Pulmonary Individual Treatment Plan  Patient Details  Name: Christine Serrano MRN: [id number] Date of Birth: 11-Aug-1960 Referring Provider:     Pulmonary Rehab from 07/20/2018 in [facility] Cardiac and Pulmonary Rehab  Referring Provider  Louretta Parma MD      Initial Encounter Date:    Pulmonary Rehab from 07/20/2018 in [facility] Cardiac and Pulmonary Rehab  Date  07/20/18      Visit Diagnosis: COPD, severe ([place])  Patient's Home Medications on Admission:  Current Outpatient Medications:  .  aspirin EC 81 MG tablet, Take 81 mg by mouth daily., Disp: , Rfl:  .  benzonatate (TESSALON) 100 MG capsule, Take 1 capsule (100 mg total) by mouth 2 (two) times daily as needed for cough., Disp: 30 capsule, Rfl: 1 .  buPROPion (WELLBUTRIN SR) 100 MG 12 hr tablet, TAKE 1 TABLET BY MOUTH EVERY DAY, Disp: 90 tablet, Rfl: 0 .  fluticasone (FLONASE) 50 MCG/ACT nasal spray, Place 2 sprays into both nostrils daily., Disp: 48 g, Rfl: 3 .  gabapentin (NEURONTIN) 100 MG capsule, Take 1 capsule (100 mg total) by mouth 2 (two) times daily., Disp: 180 capsule, Rfl: 3 .  hydrOXYzine (ATARAX/VISTARIL) 25 MG tablet, TAKE 1 TABLET (25 MG TOTAL) BY MOUTH 2 (TWO) TIMES DAILY AS NEEDED FOR ANXIETY., Disp: 180 tablet, Rfl: 0 .  ipratropium-albuterol (DUONEB) 0.5-2.5 (3) MG/3ML SOLN, INHALE 3 MLS INTO THE LUNGS EVERY 6 (SIX) HOURS AS NEEDED., Disp: 360 mL, Rfl: 3 .  LINZESS 290 MCG CAPS capsule, Take 1 capsule (290 mcg total) by mouth daily as needed (constipation)., Disp: 30 capsule, Rfl: 3 .  Multiple Vitamins-Minerals (MULTIVITAMIN ADULT) TABS, Take 1 tablet by mouth daily., Disp: , Rfl:  .  naproxen (NAPROSYN) 375 MG tablet, Take 1 tablet (375 mg total) by mouth 2 (two) times daily as needed for moderate pain., Disp: 40 tablet, Rfl: 1 .  nicotine (NICODERM CQ) 14 mg/24hr patch, Place 1 patch (14 mg total) onto the skin daily., Disp: 28 patch, Rfl: 3 .  omeprazole (PRILOSEC) 40 MG capsule, TAKE 1 CAPSULE BY MOUTH DAILY. FOR THREE  WEEKS THEN AS NEEDED. TAKE 30 MIN PRIOR TO LARGE MEAL, Disp: 90 capsule, Rfl: 1 .  OXYGEN, Inhale 3 L/min into the lungs continuous., Disp: , Rfl:  .  polyethylene glycol powder (GLYCOLAX/MIRALAX) powder, Take 17 g by mouth daily as needed for moderate constipation., Disp: 3350 g, Rfl: 1 .  PROAIR HFA 108 (90 Base) MCG/ACT inhaler, TAKE 2 PUFFS BY MOUTH EVERY 6 HOURS AS NEEDED FOR WHEEZE OR SHORTNESS OF BREATH, Disp: 54 g, Rfl: 1 .  sertraline (ZOLOFT) 100 MG tablet, TAKE 1.5 TABLETS BY MOUTH DAILY, Disp: 45 tablet, Rfl: 0 .  SYMBICORT 160-4.5 MCG/ACT inhaler, Inhale 2 puffs into the lungs 2 (two) times daily., Disp: 3 Inhaler, Rfl: 3 .  tiotropium (SPIRIVA) 18 MCG inhalation capsule, Place 1 capsule (18 mcg total) into inhaler and inhale daily., Disp: 90 capsule, Rfl: 4 .  tiZANidine (ZANAFLEX) 2 MG tablet, TAKE 1 TABLET (2 MG TOTAL) BY MOUTH 2 (TWO) TIMES DAILY AS NEEDED FOR MUSCLE SPASMS (NECK PAIN)., Disp: 60 tablet, Rfl: 1 .  traZODone (DESYREL) 50 MG tablet, Take 1 tablet (50 mg total) by mouth at bedtime., Disp: 90 tablet, Rfl: 3 .  vitamin B-12 (CYANOCOBALAMIN) 1000 MCG tablet, Take 1,000 mcg by mouth daily., Disp: , Rfl:  .  vitamin E 400 UNIT capsule, Take 400 Units by mouth daily. , Disp: , Rfl:   Past Medical History: Past  Medical History:  Diagnosis Date  . Cervical cancer ([place])   . Chronic idiopathic constipation   . COPD (chronic obstructive pulmonary disease) ([place])   . Depression   . Endometriosis   . HLD (hyperlipidemia)     Tobacco Use: Social History   Tobacco Use  Smoking Status Current Every Day Smoker  . Packs/day: 0.50  . Years: 47.00  . Pack years: 23.50  . Types: Cigarettes  . Last attempt to quit: 07/25/2018  . Years since quitting: 0.7  Smokeless Tobacco Never Used  Tobacco Comment   9/18 down to 6-7 per day, set quit date of 10/1    Labs: Recent Review Flowsheet Data    Labs for ITP Cardiac and Pulmonary Rehab Latest Ref Rng & Units 04/04/2009  04/09/2016 02/05/2017 02/06/2018 02/11/2019   Cholestrol 0 - 200 mg/dL - - 207(H) 181 207(H)   LDLCALC 0 - 99 mg/dL - - 137(H) 105(H) 133(H)   HDL >39.00 mg/dL - - 43.60 45.30 49.40   Trlycerides 0.0 - 149.0 mg/dL - - 133.0 153.0(H) 122.0   PHART 7.350 - 7.450 7.302(L) 7.30(L) - - -   PCO2ART 32.0 - 48.0 mmHg 59.5 CRITICAL RESULT CALLED TO, READ BACK BY AND VERIFIED WITH: [facility] 04/04/09 AT 1440 CRITICAL RESULT CALLED TO, READ BACK BY AND VERIFIED WITH: [facility] 04/04/09 AT 1440 BY BROADNAX,L.RRT(HH) 65(H) - - -   HCO3 20.0 - 28.0 mmol/L 28.5(H) 32.0(H) - - -   TCO2 0 - 100 mmol/L 25.9 - - - -   O2SAT % 95.5 88.7 - - -       Pulmonary Assessment Scores:   UCSD: Self-administered rating of dyspnea associated with activities of daily living (ADLs) 6-point scale (0 = "not at all" to 5 = "maximal or unable to do because of breathlessness")  Scoring Scores range from 0 to 120.  Minimally important difference is 5 units  CAT: CAT can identify the health impairment of COPD patients and is better correlated with disease progression.  CAT has a scoring range of zero to 40. The CAT score is classified into four groups of low (less than 10), medium (10 - 20), high (21-30) and very high (31-40) based on the impact level of disease on health status. A CAT score over 10 suggests significant symptoms.  A worsening CAT score could be explained by an exacerbation, poor medication adherence, poor inhaler technique, or progression of COPD or comorbid conditions.  CAT MCID is 2 points  mMRC: mMRC (Modified Medical Research Council) Dyspnea Scale is used to assess the degree of baseline functional disability in patients of respiratory disease due to dyspnea. No minimal important difference is established. A decrease in score of 1 point or greater is considered a positive change.   Pulmonary Function Assessment:   Exercise Target Goals: Exercise Program Goal: Individual exercise prescription set  using results from initial 6 min walk test and THRR while considering  patient's activity barriers and safety.   Exercise Prescription Goal: Initial exercise prescription builds to 30-45 minutes a day of aerobic activity, 2-3 days per week.  Home exercise guidelines will be given to patient during program as part of exercise prescription that the participant will acknowledge.  Activity Barriers & Risk Stratification:   6 Minute Walk:  Oxygen Initial Assessment:   Oxygen Re-Evaluation: Oxygen Re-Evaluation    Row Name 11/04/18 1029 12/02/18 1210 03/05/19 1142         Program Oxygen Prescription   Program Oxygen Prescription  Continuous;E-Tanks  Continuous;E-Tanks  Continuous;E-Tanks     Liters per minute  '3  3  3       '$ Home Oxygen   Home Oxygen Device  Home Concentrator;Portable Concentrator;E-Tanks  Home Concentrator;Portable Concentrator;E-Tanks  Home Concentrator;Portable Concentrator;E-Tanks     Sleep Oxygen Prescription  Continuous  Continuous  Continuous     Liters per minute  -  3  3     Home Exercise Oxygen Prescription  Pulsed  Pulsed  Pulsed     Liters per minute  '3  3  3     '$ Home at Rest Exercise Oxygen Prescription  Continuous  Continuous  Continuous     Liters per minute  3  -  3     Compliance with Home Oxygen Use  Yes  Yes  Yes       Goals/Expected Outcomes   Short Term Goals  To learn and exhibit compliance with exercise, home and travel O2 prescription;To learn and understand importance of monitoring SPO2 with pulse oximeter and demonstrate accurate use of the pulse oximeter.;To learn and understand importance of maintaining oxygen saturations>88%;To learn and demonstrate proper pursed lip breathing techniques or other breathing techniques.;To learn and demonstrate proper use of respiratory medications  To learn and exhibit compliance with exercise, home and travel O2 prescription;To learn and understand importance of monitoring SPO2 with pulse oximeter and  demonstrate accurate use of the pulse oximeter.;To learn and understand importance of maintaining oxygen saturations>88%;To learn and demonstrate proper pursed lip breathing techniques or other breathing techniques.;To learn and demonstrate proper use of respiratory medications  To learn and exhibit compliance with exercise, home and travel O2 prescription;To learn and understand importance of monitoring SPO2 with pulse oximeter and demonstrate accurate use of the pulse oximeter.;To learn and understand importance of maintaining oxygen saturations>88%;To learn and demonstrate proper pursed lip breathing techniques or other breathing techniques.;To learn and demonstrate proper use of respiratory medications     Long  Term Goals  Exhibits compliance with exercise, home and travel O2 prescription;Verbalizes importance of monitoring SPO2 with pulse oximeter and return demonstration;Maintenance of O2 saturations>88%;Exhibits proper breathing techniques, such as pursed lip breathing or other method taught during program session;Compliance with respiratory medication;Demonstrates proper use of MDI's  Exhibits compliance with exercise, home and travel O2 prescription;Verbalizes importance of monitoring SPO2 with pulse oximeter and return demonstration;Maintenance of O2 saturations>88%;Exhibits proper breathing techniques, such as pursed lip breathing or other method taught during program session;Compliance with respiratory medication;Demonstrates proper use of MDI's  Exhibits compliance with exercise, home and travel O2 prescription;Verbalizes importance of monitoring SPO2 with pulse oximeter and return demonstration;Maintenance of O2 saturations>88%;Exhibits proper breathing techniques, such as pursed lip breathing or other method taught during program session;Compliance with respiratory medication;Demonstrates proper use of MDI's     Comments  Moesha continues to stay compliant with her oxygen and numbers have been  looking good.  She continues to use her PLB and her breathing is doing better overall.  She has been working outside more too. She mentioned that yesterday she started having a runny nose and coughing again. She was encouraged to contact her doctor about it today!  Shaylene is compliant with her oxygen and reports no issues. Her SOB has improved since increasing activity slowly. Cough from previous documentation has improved.  Hayleigh has continued to be compliant with her oxygen.  Her breathing is improving.  She continues to use her PLB to help.  Coughing continues to improve.     Goals/Expected Outcomes  Short:  Contact doctor about cough.  Long: Continued compliance with oxygen.   Short:continue daily medications Long: maintain independence with oxygen management  Short:continue daily medications Long: maintain independence with oxygen management        Oxygen Discharge (Final Oxygen Re-Evaluation): Oxygen Re-Evaluation - 03/05/19 1142      Program Oxygen Prescription   Program Oxygen Prescription  Continuous;E-Tanks    Liters per minute  3      Home Oxygen   Home Oxygen Device  Home Concentrator;Portable Concentrator;E-Tanks    Sleep Oxygen Prescription  Continuous    Liters per minute  3    Home Exercise Oxygen Prescription  Pulsed    Liters per minute  3    Home at Rest Exercise Oxygen Prescription  Continuous    Liters per minute  3    Compliance with Home Oxygen Use  Yes      Goals/Expected Outcomes   Short Term Goals  To learn and exhibit compliance with exercise, home and travel O2 prescription;To learn and understand importance of monitoring SPO2 with pulse oximeter and demonstrate accurate use of the pulse oximeter.;To learn and understand importance of maintaining oxygen saturations>88%;To learn and demonstrate proper pursed lip breathing techniques or other breathing techniques.;To learn and demonstrate proper use of respiratory medications    Long  Term Goals  Exhibits compliance  with exercise, home and travel O2 prescription;Verbalizes importance of monitoring SPO2 with pulse oximeter and return demonstration;Maintenance of O2 saturations>88%;Exhibits proper breathing techniques, such as pursed lip breathing or other method taught during program session;Compliance with respiratory medication;Demonstrates proper use of MDI's    Comments  Clorene has continued to be compliant with her oxygen.  Her breathing is improving.  She continues to use her PLB to help.  Coughing continues to improve.    Goals/Expected Outcomes  Short:continue daily medications Long: maintain independence with oxygen management       Initial Exercise Prescription:   Perform Capillary Blood Glucose checks as needed.  Exercise Prescription Changes: Exercise Prescription Changes    Row Name 02/05/19 1000 02/26/19 1200 03/09/19 1600         Response to Exercise   Blood Pressure (Admit)  116/70  118/62  124/64     Blood Pressure (Exercise)  124/68  128/58  144/64     Blood Pressure (Exit)  130/64  110/58  124/56     Heart Rate (Admit)  110 bpm  100 bpm  106 bpm     Heart Rate (Exercise)  140 bpm  142 bpm  136 bpm     Heart Rate (Exit)  135 bpm  127 bpm  132 bpm     Oxygen Saturation (Admit)  98 %  98 %  96 %     Oxygen Saturation (Exercise)  97 %  92 %  96 %     Oxygen Saturation (Exit)  97 %  94 %  98 %     Rating of Perceived Exertion (Exercise)  '17  17  12     '$ Perceived Dyspnea (Exercise)  '4  4  1     '$ Symptoms  -  -  none     Duration  Continue with 30 min of aerobic exercise without signs/symptoms of physical distress.  Continue with 30 min of aerobic exercise without signs/symptoms of physical distress.  Continue with 30 min of aerobic exercise without signs/symptoms of physical distress.     Intensity  THRR unchanged  THRR unchanged  THRR unchanged  Progression   Progression  Continue to progress workloads to maintain intensity without signs/symptoms of physical distress.  Continue  to progress workloads to maintain intensity without signs/symptoms of physical distress.  Continue to progress workloads to maintain intensity without signs/symptoms of physical distress.     Average METs  3.3  2.1  2.7       Resistance Training   Training Prescription  Yes  Yes  Yes     Weight  3 lb  3 lb  3 lbs     Reps  10-15  10-15  10-15       Interval Training   Interval Training  No  No  No       Treadmill   MPH  -  1.2  1.7     Grade  -  0  0     Minutes  -  15  15     METs  -  1.92  2.3       Recumbant Bike   Level  -  -  1     Watts  -  -  15     Minutes  -  -  15       NuStep   Level  1  -  -     SPM  80  -  -     Minutes  15  -  -     METs  2.4  -  -       REL-XR   Level  1  -  1     Speed  50  -  -     Minutes  15  -  15     METs  4.4  -  3.7       T5 Nustep   Level  -  1  3     SPM  -  80  -     Minutes  -  15  15     METs  -  2.3  2.1       Home Exercise Plan   Plans to continue exercise at  Home (comment) walking  Home (comment) walking  Home (comment) walking     Frequency  Add 1 additional day to program exercise sessions.  Add 1 additional day to program exercise sessions.  Add 1 additional day to program exercise sessions.     Initial Home Exercises Provided  07/29/18  07/29/18  07/29/18        Exercise Comments:   Exercise Goals and Review:   Exercise Goals Re-Evaluation : Exercise Goals Re-Evaluation    Row Name 11/04/18 1021 12/02/18 1152 02/05/19 1018 02/26/19 1258 03/05/19 1136     Exercise Goal Re-Evaluation   Exercise Goals Review  Increase Physical Activity;Increase Strength and Stamina;Understanding of Exercise Prescription  Increase Physical Activity;Increase Strength and Stamina;Understanding of Exercise Prescription  Increase Physical Activity;Increase Strength and Stamina;Able to understand and use rate of perceived exertion (RPE) scale;Able to understand and use Dyspnea scale;Knowledge and understanding of Target Heart Rate  Range (THRR);Able to check pulse independently;Understanding of Exercise Prescription  Increase Physical Activity;Increase Strength and Stamina;Able to understand and use rate of perceived exertion (RPE) scale;Able to understand and use Dyspnea scale;Knowledge and understanding of Target Heart Rate Range (THRR);Able to check pulse independently;Understanding of Exercise Prescription  Increase Physical Activity;Increase Strength and Stamina;Understanding of Exercise Prescription   Comments  Jeanean has been doing well at home.  She has been  getting in more walking and now also working out in the yard.  She does her weights each day.  She has not been getting our emails as her phone had died, recent the videos to her.  Ingrid has been feeling great these past few weeks! She has tapered off her depression meds (per MD supervision) and has been feeling so much more active. Her horse has been keeping her busy, she's been weeding around her bushes, and canning fruits and vegetables. All these activities have boosted her spirit and therefore encouraged her to be more active  Rickesha is just returning to in person LW classes.  She reports RPE of 14-17.  Regular attendance will improver her stamina.  Artisha has not been able to attend consistently.  Staff will monitor progress  Britiny is doing better in rehab now that she is coming later in the day.  She was able to ride her horse last week.  She is doing some walking at home on her off days.   Expected Outcomes  Short: Try out our videos.  Long: Continue to rebuild stamina.   Short: watch the videos, continue increasing activity. Long: build stamina to keep up with daily activities/chores  Short - attend consistently Long - increase overall stamina and MET level  Short - attend consistently Long - increase overall stamina and MET level  Short: [place] to attend consistently  Long: Continue to build stamina.   [place] Name 03/30/19 1540             Exercise Goal Re-Evaluation    Comments  Out since last review.  Last day was 9/18.          Discharge Exercise Prescription (Final Exercise Prescription Changes): Exercise Prescription Changes - 03/09/19 1600      Response to Exercise   Blood Pressure (Admit)  124/64    Blood Pressure (Exercise)  144/64    Blood Pressure (Exit)  124/56    Heart Rate (Admit)  106 bpm    Heart Rate (Exercise)  136 bpm    Heart Rate (Exit)  132 bpm    Oxygen Saturation (Admit)  96 %    Oxygen Saturation (Exercise)  96 %    Oxygen Saturation (Exit)  98 %    Rating of Perceived Exertion (Exercise)  12    Perceived Dyspnea (Exercise)  1    Symptoms  none    Duration  Continue with 30 min of aerobic exercise without signs/symptoms of physical distress.    Intensity  THRR unchanged      Progression   Progression  Continue to progress workloads to maintain intensity without signs/symptoms of physical distress.    Average METs  2.7      Resistance Training   Training Prescription  Yes    Weight  3 lbs    Reps  10-15      Interval Training   Interval Training  No      Treadmill   MPH  1.7    Grade  0    Minutes  15    METs  2.3      Recumbant Bike   Level  1    Watts  15    Minutes  15      REL-XR   Level  1    Minutes  15    METs  3.7      T5 Nustep   Level  3    Minutes  15  METs  2.1      Home Exercise Plan   Plans to continue exercise at  Home (comment)   walking   Frequency  Add 1 additional day to program exercise sessions.    Initial Home Exercises Provided  07/29/18       Nutrition:  Target Goals: Understanding of nutrition guidelines, daily intake of sodium '1500mg'$ , cholesterol '200mg'$ , calories 30% from fat and 7% or less from saturated fats, daily to have 5 or more servings of fruits and vegetables.  Biometrics:    Nutrition Therapy Plan and Nutrition Goals: Nutrition Therapy & Goals - 10/27/18 1336      Nutrition Therapy   Diet  heart healthy, low sodium diet (right now focus on  increasing kcal and pro)    Protein (specify units)  40-50g    Fiber  25 grams    Whole Grain Foods  3 servings    Saturated Fats  12 max. grams    Fruits and Vegetables  5 servings/day    Sodium  1.5 grams      Personal Nutrition Goals   Nutrition Goal  ST: increase calories and protein eaten daily LT: gain appetite back and maintain weight    Comments  Appetite is good, but depressed and anxious so will not eat. is not eating as much as usual. Pt reports lost about 10 pounds, went down pant sizes (unsure how much). has less energy and it is harder to do things.  soemtimes wont eat at all; usually  eats once a day (about a fistfull - a sandwich) - since the fall.  discussed ways to increase intake to meet calorie and protein needs, told pt to eat small frequent meals making sure to eat protein foods first, add some healthy fat to food, etc. Pt does not like guacaomole but suggested soemthing like a bean dip/hummus, yogurt, or peanut butter to add protein and fat. Pt reports eating raw veggies and ranch as a snack.  Discussed MyPlate. Suggested she talk to her psychologist again for her depression and anxiety - they may have telehealth available.       Intervention Plan   Intervention  Prescribe, educate and counsel regarding individualized specific dietary modifications aiming towards targeted core components such as weight, hypertension, lipid management, diabetes, heart failure and other comorbidities.    Expected Outcomes  Short Term Goal: Understand basic principles of dietary content, such as calories, fat, sodium, cholesterol and nutrients.;Short Term Goal: A plan has been developed with personal nutrition goals set during dietitian appointment.;Long Term Goal: Adherence to prescribed nutrition plan.       Nutrition Assessments:   Nutrition Goals Re-Evaluation:   Nutrition Goals Discharge (Final Nutrition Goals Re-Evaluation):   Psychosocial: Target Goals: Acknowledge presence or  absence of significant depression and/or stress, maximize coping skills, provide positive support system. Participant is able to verbalize types and ability to use techniques and skills needed for reducing stress and depression.   Initial Review & Psychosocial Screening:   Quality of Life Scores:  Scores of 19 and below usually indicate a poorer quality of life in these areas.  A difference of  2-3 points is a clinically meaningful difference.  A difference of 2-3 points in the total score of the Quality of Life Index has been associated with significant improvement in overall quality of life, self-image, physical symptoms, and general health in studies assessing change in quality of life.  PHQ-9: Recent Review Flowsheet Data    Depression screen PHQ  2/9 02/25/2019 08/13/2018 07/20/2018 03/09/2018 02/06/2018   Decreased Interest '1 2 3 2 1   '$ Down, Depressed, Hopeless '1 2 3 3 3   '$ PHQ - 2 Score '2 4 6 5 4   '$ Altered sleeping '2 3 3 3 1   '$ Tired, decreased energy '3 2 3 3 3   '$ Change in appetite '2 1 3 2 1   '$ Feeling bad or failure about yourself  '1 3 2 1 1   '$ Trouble concentrating 0 '1 3 1 '$ 0   Moving slowly or fidgety/restless 0 0 0 1 0   Suicidal thoughts 0 0 0 0 0   PHQ-9 Score '10 14 20 16 10   '$ Difficult doing work/chores - - Very difficult Extremely dIfficult Very difficult     Interpretation of Total Score  Total Score Depression Severity:  1-4 = Minimal depression, 5-9 = Mild depression, 10-14 = Moderate depression, 15-19 = Moderately severe depression, 20-27 = Severe depression   Psychosocial Evaluation and Intervention:   Psychosocial Re-Evaluation: Psychosocial Re-Evaluation    [place] Name 11/04/18 1022 12/02/18 1156 03/05/19 1137         Psychosocial Re-Evaluation   Current issues with  Current Depression;Current Stress Concerns  Current Depression;Current Stress Concerns  Current Depression;Current Stress Concerns     Comments  Alita has been feeling better.  She is still working on her  smoking.  She has been getting outside more and working in yard.  She is excited about the arrival of a little of puppies soon.  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  This has all lifted her spirits!!  She does need to get the welping box built.  She has also been able to work with her horse some too.   Mikhaela has tapered off her depression meds for 3 weeks now (under MD supervision) and has been feeling great. She thought it was her sickness making her feel so weak, but once she has come off these meds she feels so much "healthier" and is very prou of herself for doing more around the house  Erendida is doing well mentally.  She is back on her meds and denies symptoms of depression.  She has set a quit date for smoking of October 1st!!  She is ready.     Expected Outcomes  Short: Enjoy the puppies  Long: Continue to stay positive!  Short: prepare for the puppies in September, accomplih chores outside (weeding, harvesting fruits for [employer], etc), celebrate her 58th birthday 7[birth year].. Long:continue to work on self care and enjoy her animals.  Short: Quit smoking.  Long: Continue to practice self care.     Interventions  Encouraged to attend Pulmonary Rehabilitation for the exercise  Encouraged to attend Pulmonary Rehabilitation for the exercise  Encouraged to attend Pulmonary Rehabilitation for the exercise     Continue Psychosocial Services   Follow up required by staff  Follow up required by staff  Follow up required by staff        Psychosocial Discharge (Final Psychosocial Re-Evaluation): Psychosocial Re-Evaluation - 03/05/19 1137      Psychosocial Re-Evaluation   Current issues with  Current Depression;Current Stress Concerns    Comments  Francys is doing well mentally.  She is back on her meds and denies symptoms of depression.  She has set a quit date for smoking of October 1st!!  She is ready.    Expected Outcomes  Short: Quit smoking.  Long: Continue to  practice self care.     Interventions  Encouraged to attend Pulmonary Rehabilitation for the exercise    Continue Psychosocial Services   Follow up required by staff       Education: Education Goals: Education classes will be provided on a weekly basis, covering required topics. Participant will state understanding/return demonstration of topics presented.  Learning Barriers/Preferences:   Education Topics:  Initial Evaluation Education: - Verbal, written and demonstration of respiratory meds, oximetry and breathing techniques. Instruction on use of nebulizers and MDIs and importance of monitoring MDI activations.   Pulmonary Rehab from 08/05/2018 in [facility] Cardiac and Pulmonary Rehab  Date  07/20/18  Educator  [facility]  Instruction Review Code  1- Verbalizes Understanding      General Nutrition Guidelines/Fats and Fiber: -Group instruction provided by verbal, written material, models and posters to present the general guidelines for heart healthy nutrition. Gives an explanation and review of dietary fats and fiber.   Pulmonary Rehab from 08/05/2018 in [facility] Cardiac and Pulmonary Rehab  Date  07/29/18  Educator  [facility]  Instruction Review Code  1- Verbalizes Understanding      Controlling Sodium/Reading Food Labels: -Group verbal and written material supporting the discussion of sodium use in heart healthy nutrition. Review and explanation with models, verbal and written materials for utilization of the food label.   Exercise Physiology & General Exercise Guidelines: - Group verbal and written instruction with models to review the exercise physiology of the cardiovascular system and associated critical values. Provides general exercise guidelines with specific guidelines to those with heart or lung disease.    Aerobic Exercise & Resistance Training: - Gives group verbal and written instruction on the various components of exercise. Focuses on aerobic and resistive training programs and the benefits of this training  and how to safely progress through these programs.   Flexibility, Balance, Mind/Body Relaxation: Provides group verbal/written instruction on the benefits of flexibility and balance training, including mind/body exercise modes such as yoga, pilates and tai chi.  Demonstration and skill practice provided.   Stress and Anxiety: - Provides group verbal and written instruction about the health risks of elevated stress and causes of high stress.  Discuss the correlation between heart/lung disease and anxiety and treatment options. Review healthy ways to manage with stress and anxiety.   Pulmonary Rehab from 08/05/2018 in [facility] Cardiac and Pulmonary Rehab  Date  07/22/18  Educator  [facility]  Instruction Review Code  1- Verbalizes Understanding      Depression: - Provides group verbal and written instruction on the correlation between heart/lung disease and depressed mood, treatment options, and the stigmas associated with seeking treatment.   Exercise & Equipment Safety: - Individual verbal instruction and demonstration of equipment use and safety with use of the equipment.   Pulmonary Rehab from 08/05/2018 in [facility] Cardiac and Pulmonary Rehab  Date  07/20/18  Educator  [facility]  Instruction Review Code  1- Verbalizes Understanding      Infection Prevention: - Provides verbal and written material to individual with discussion of infection control including proper hand washing and proper equipment cleaning during exercise session.   Pulmonary Rehab from 08/05/2018 in [facility] Cardiac and Pulmonary Rehab  Date  07/20/18  Educator  [facility]  Instruction Review Code  1- Verbalizes Understanding      Falls Prevention: - Provides verbal and written material to individual with discussion of falls prevention and safety.   Pulmonary Rehab from 08/05/2018 in [facility] Cardiac and Pulmonary Rehab  Date  07/20/18  Educator  [facility]  Instruction Review Code  1- Verbalizes Understanding      Diabetes: - Individual verbal and  written instruction to review signs/symptoms of diabetes, desired ranges of glucose level fasting, after meals and with exercise. Advice that pre and post exercise glucose checks will be done for 3 sessions at entry of program.   Chronic Lung Diseases: - Group verbal and written instruction to review updates, respiratory medications, advancements in procedures and treatments. Discuss use of supplemental oxygen including available portable oxygen systems, continuous and intermittent flow rates, concentrators, personal use and safety guidelines. Review proper use of inhaler and spacers. Provide informative websites for self-education.    Energy Conservation: - Provide group verbal and written instruction for methods to conserve energy, plan and organize activities. Instruct on pacing techniques, use of adaptive equipment and posture/positioning to relieve shortness of breath.   Triggers and Exacerbations: - Group verbal and written instruction to review types of environmental triggers and ways to prevent exacerbations. Discuss weather changes, air quality and the benefits of nasal washing. Review warning signs and symptoms to help prevent infections. Discuss techniques for effective airway clearance, coughing, and vibrations.   Pulmonary Rehab from 08/07/2016 in [facility] Cardiac and Pulmonary Rehab  Date  08/07/16  Educator  [facility]  Instruction Review Code (retired)  2- meets goals/outcomes      AED/CPR: - Group verbal and written instruction with the use of models to demonstrate the basic use of the AED with the basic ABC's of resuscitation.   Anatomy and Physiology of the Lungs: - Group verbal and written instruction with the use of models to provide basic lung anatomy and physiology related to function, structure and complications of lung disease.   Anatomy & Physiology of the Heart: - Group verbal and written instruction and models provide basic cardiac anatomy and physiology, with the coronary  electrical and arterial systems. Review of Valvular disease and Heart Failure   Cardiac Medications: - Group verbal and written instruction to review commonly prescribed medications for heart disease. Reviews the medication, class of the drug, and side effects.   Know Your Numbers and Risk Factors: -Group verbal and written instruction about important numbers in your health.  Discussion of what are risk factors and how they play a role in the disease process.  Review of Cholesterol, Blood Pressure, Diabetes, and BMI and the role they play in your overall health.   Sleep Hygiene: -Provides group verbal and written instruction about how sleep can affect your health.  Define sleep hygiene, discuss sleep cycles and impact of sleep habits. Review good sleep hygiene tips.    Other: -Provides group and verbal instruction on various topics (see comments)    Knowledge Questionnaire Score:    Core Components/Risk Factors/Patient Goals at Admission:   Core Components/Risk Factors/Patient Goals Review:  Goals and Risk Factor Review    Row Name 11/04/18 1027 12/02/18 1207 03/05/19 1138         Core Components/Risk Factors/Patient Goals Review   Personal Goals Review  Weight Management/Obesity;Improve shortness of breath with ADL's;Tobacco Cessation  Weight Management/Obesity;Improve shortness of breath with ADL's;Tobacco Cessation  Weight Management/Obesity;Improve shortness of breath with ADL's;Tobacco Cessation     Review  Armya has been doing well at home. Her weight continues to stay steady.  She is still smoking but not increasing.  Her breathing has gotten better overall.    Darthy has been maintaining her weight, decreasing her SOB by increasing stamina and time being active,  and not increasing smoking  Mica has set a quit date of October 1st!!  She has had a few days without.  Her weight is coming back up now that her mood is better.  Her breathing is improving.     Expected Outcomes   Short: Continue to work on smoking  Long: Continue to monitor risk factors.   Short: continue working on SOB, Long: decrease smoking  Short: Quit smoking!  Long: Remain smoke free.        Core Components/Risk Factors/Patient Goals at Discharge (Final Review):  Goals and Risk Factor Review - 03/05/19 1138      Core Components/Risk Factors/Patient Goals Review   Personal Goals Review  Weight Management/Obesity;Improve shortness of breath with ADL's;Tobacco Cessation    Review  Marieliz has set a quit date of October 1st!!  She has had a few days without.  Her weight is coming back up now that her mood is better.  Her breathing is improving.    Expected Outcomes  Short: Quit smoking!  Long: Remain smoke free.       ITP Comments: ITP Comments    Row Name 01/13/19 1025 01/27/19 1004 02/23/19 0908 02/24/19 0558 03/01/19 1200   ITP Comments  Scheduled to return on 8/3.  Exctied to come back.  REturned to Pulmonary Rehab after we were closed for COVID 19.  ITP sent to Dr Sabra Heck for review, changes as needed and signature.  Unable to complete nutrition 30-day re-eval cycle due to inconsistent attendence  30 Day review. Continue with ITP unless directed changes per Medical Director review.  8/19 last visit  Called to check to on patient.  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  She is also just not able to get up and get moving in the morning.  She request to change class times to 1115.   [place] Name 03/15/19 1429 04/06/19 1127 04/14/19 1157       ITP Comments  Maragret called out on Friday with low O2 saturations.  Today she is feeling better but slept til 1045.   She is hoping to attend on Wednesday.  Called to check on pt.  She has been out since 9/18.  Left message.  Called to check on pt.  Left message.  Explained that we will need her to return our contact by the end of this week or we will discharge her from the program.        Comments: Discharge ITP

## 2019-04-21 ENCOUNTER — Encounter: Payer: Self-pay | Admitting: *Deleted

## 2019-04-21 ENCOUNTER — Ambulatory Visit: Payer: Medicare Other

## 2019-04-21 DIAGNOSIS — J449 Chronic obstructive pulmonary disease, unspecified: Secondary | ICD-10-CM

## 2019-04-21 NOTE — Progress Notes (Signed)
Pulmonary Individual Treatment Plan  Patient Details  Name: Christine Serrano MRN: [id number] Date of Birth: May 31, 1961 Referring Provider:     Pulmonary Rehab from 07/20/2018 in [facility] Cardiac and Pulmonary Rehab  Referring Provider  Louretta Parma MD      Initial Encounter Date:    Pulmonary Rehab from 07/20/2018 in [facility] Cardiac and Pulmonary Rehab  Date  07/20/18      Visit Diagnosis: COPD, severe ([place])  Patient's Home Medications on Admission:  Current Outpatient Medications:  .  aspirin EC 81 MG tablet, Take 81 mg by mouth daily., Disp: , Rfl:  .  benzonatate (TESSALON) 100 MG capsule, Take 1 capsule (100 mg total) by mouth 2 (two) times daily as needed for cough., Disp: 30 capsule, Rfl: 1 .  buPROPion (WELLBUTRIN SR) 100 MG 12 hr tablet, TAKE 1 TABLET BY MOUTH EVERY DAY, Disp: 90 tablet, Rfl: 0 .  fluticasone (FLONASE) 50 MCG/ACT nasal spray, Place 2 sprays into both nostrils daily., Disp: 48 g, Rfl: 3 .  gabapentin (NEURONTIN) 100 MG capsule, Take 1 capsule (100 mg total) by mouth 2 (two) times daily., Disp: 180 capsule, Rfl: 3 .  hydrOXYzine (ATARAX/VISTARIL) 25 MG tablet, TAKE 1 TABLET (25 MG TOTAL) BY MOUTH 2 (TWO) TIMES DAILY AS NEEDED FOR ANXIETY., Disp: 180 tablet, Rfl: 0 .  ipratropium-albuterol (DUONEB) 0.5-2.5 (3) MG/3ML SOLN, INHALE 3 MLS INTO THE LUNGS EVERY 6 (SIX) HOURS AS NEEDED., Disp: 360 mL, Rfl: 3 .  LINZESS 290 MCG CAPS capsule, Take 1 capsule (290 mcg total) by mouth daily as needed (constipation)., Disp: 30 capsule, Rfl: 3 .  Multiple Vitamins-Minerals (MULTIVITAMIN ADULT) TABS, Take 1 tablet by mouth daily., Disp: , Rfl:  .  naproxen (NAPROSYN) 375 MG tablet, Take 1 tablet (375 mg total) by mouth 2 (two) times daily as needed for moderate pain., Disp: 40 tablet, Rfl: 1 .  nicotine (NICODERM CQ) 14 mg/24hr patch, Place 1 patch (14 mg total) onto the skin daily., Disp: 28 patch, Rfl: 3 .  omeprazole (PRILOSEC) 40 MG capsule, TAKE 1 CAPSULE BY MOUTH DAILY. FOR THREE  WEEKS THEN AS NEEDED. TAKE 30 MIN PRIOR TO LARGE MEAL, Disp: 90 capsule, Rfl: 1 .  OXYGEN, Inhale 3 L/min into the lungs continuous., Disp: , Rfl:  .  polyethylene glycol powder (GLYCOLAX/MIRALAX) powder, Take 17 g by mouth daily as needed for moderate constipation., Disp: 3350 g, Rfl: 1 .  PROAIR HFA 108 (90 Base) MCG/ACT inhaler, TAKE 2 PUFFS BY MOUTH EVERY 6 HOURS AS NEEDED FOR WHEEZE OR SHORTNESS OF BREATH, Disp: 54 g, Rfl: 1 .  sertraline (ZOLOFT) 100 MG tablet, TAKE 1.5 TABLETS BY MOUTH DAILY, Disp: 45 tablet, Rfl: 0 .  SYMBICORT 160-4.5 MCG/ACT inhaler, Inhale 2 puffs into the lungs 2 (two) times daily., Disp: 3 Inhaler, Rfl: 3 .  tiotropium (SPIRIVA) 18 MCG inhalation capsule, Place 1 capsule (18 mcg total) into inhaler and inhale daily., Disp: 90 capsule, Rfl: 4 .  tiZANidine (ZANAFLEX) 2 MG tablet, TAKE 1 TABLET (2 MG TOTAL) BY MOUTH 2 (TWO) TIMES DAILY AS NEEDED FOR MUSCLE SPASMS (NECK PAIN)., Disp: 60 tablet, Rfl: 1 .  traZODone (DESYREL) 50 MG tablet, Take 1 tablet (50 mg total) by mouth at bedtime., Disp: 90 tablet, Rfl: 3 .  vitamin B-12 (CYANOCOBALAMIN) 1000 MCG tablet, Take 1,000 mcg by mouth daily., Disp: , Rfl:  .  vitamin E 400 UNIT capsule, Take 400 Units by mouth daily. , Disp: , Rfl:   Past Medical History: Past  Medical History:  Diagnosis Date  . Cervical cancer ([place])   . Chronic idiopathic constipation   . COPD (chronic obstructive pulmonary disease) ([place])   . Depression   . Endometriosis   . HLD (hyperlipidemia)     Tobacco Use: Social History   Tobacco Use  Smoking Status Current Every Day Smoker  . Packs/day: 0.50  . Years: 47.00  . Pack years: 23.50  . Types: Cigarettes  . Last attempt to quit: 07/25/2018  . Years since quitting: 0.7  Smokeless Tobacco Never Used  Tobacco Comment   9/18 down to 6-7 per day, set quit date of 10/1    Labs: Recent Review Flowsheet Data    Labs for ITP Cardiac and Pulmonary Rehab Latest Ref Rng & Units 04/04/2009  04/09/2016 02/05/2017 02/06/2018 02/11/2019   Cholestrol 0 - 200 mg/dL - - 207(H) 181 207(H)   LDLCALC 0 - 99 mg/dL - - 137(H) 105(H) 133(H)   HDL >39.00 mg/dL - - 43.60 45.30 49.40   Trlycerides 0.0 - 149.0 mg/dL - - 133.0 153.0(H) 122.0   PHART 7.350 - 7.450 7.302(L) 7.30(L) - - -   PCO2ART 32.0 - 48.0 mmHg 59.5 CRITICAL RESULT CALLED TO, READ BACK BY AND VERIFIED WITH: [facility] 04/04/09 AT 1440 CRITICAL RESULT CALLED TO, READ BACK BY AND VERIFIED WITH: [facility] 04/04/09 AT 1440 BY BROADNAX,L.RRT(HH) 65(H) - - -   HCO3 20.0 - 28.0 mmol/L 28.5(H) 32.0(H) - - -   TCO2 0 - 100 mmol/L 25.9 - - - -   O2SAT % 95.5 88.7 - - -       Pulmonary Assessment Scores:   UCSD: Self-administered rating of dyspnea associated with activities of daily living (ADLs) 6-point scale (0 = "not at all" to 5 = "maximal or unable to do because of breathlessness")  Scoring Scores range from 0 to 120.  Minimally important difference is 5 units  CAT: CAT can identify the health impairment of COPD patients and is better correlated with disease progression.  CAT has a scoring range of zero to 40. The CAT score is classified into four groups of low (less than 10), medium (10 - 20), high (21-30) and very high (31-40) based on the impact level of disease on health status. A CAT score over 10 suggests significant symptoms.  A worsening CAT score could be explained by an exacerbation, poor medication adherence, poor inhaler technique, or progression of COPD or comorbid conditions.  CAT MCID is 2 points  mMRC: mMRC (Modified Medical Research Council) Dyspnea Scale is used to assess the degree of baseline functional disability in patients of respiratory disease due to dyspnea. No minimal important difference is established. A decrease in score of 1 point or greater is considered a positive change.   Pulmonary Function Assessment:   Exercise Target Goals: Exercise Program Goal: Individual exercise prescription set  using results from initial 6 min walk test and THRR while considering  patient's activity barriers and safety.   Exercise Prescription Goal: Initial exercise prescription builds to 30-45 minutes a day of aerobic activity, 2-3 days per week.  Home exercise guidelines will be given to patient during program as part of exercise prescription that the participant will acknowledge.  Activity Barriers & Risk Stratification:   6 Minute Walk:  Oxygen Initial Assessment:   Oxygen Re-Evaluation: Oxygen Re-Evaluation    Row Name 11/04/18 1029 12/02/18 1210 03/05/19 1142         Program Oxygen Prescription   Program Oxygen Prescription  Continuous;E-Tanks  Continuous;E-Tanks  Continuous;E-Tanks     Liters per minute  '3  3  3       '$ Home Oxygen   Home Oxygen Device  Home Concentrator;Portable Concentrator;E-Tanks  Home Concentrator;Portable Concentrator;E-Tanks  Home Concentrator;Portable Concentrator;E-Tanks     Sleep Oxygen Prescription  Continuous  Continuous  Continuous     Liters per minute  -  3  3     Home Exercise Oxygen Prescription  Pulsed  Pulsed  Pulsed     Liters per minute  '3  3  3     '$ Home at Rest Exercise Oxygen Prescription  Continuous  Continuous  Continuous     Liters per minute  3  -  3     Compliance with Home Oxygen Use  Yes  Yes  Yes       Goals/Expected Outcomes   Short Term Goals  To learn and exhibit compliance with exercise, home and travel O2 prescription;To learn and understand importance of monitoring SPO2 with pulse oximeter and demonstrate accurate use of the pulse oximeter.;To learn and understand importance of maintaining oxygen saturations>88%;To learn and demonstrate proper pursed lip breathing techniques or other breathing techniques.;To learn and demonstrate proper use of respiratory medications  To learn and exhibit compliance with exercise, home and travel O2 prescription;To learn and understand importance of monitoring SPO2 with pulse oximeter and  demonstrate accurate use of the pulse oximeter.;To learn and understand importance of maintaining oxygen saturations>88%;To learn and demonstrate proper pursed lip breathing techniques or other breathing techniques.;To learn and demonstrate proper use of respiratory medications  To learn and exhibit compliance with exercise, home and travel O2 prescription;To learn and understand importance of monitoring SPO2 with pulse oximeter and demonstrate accurate use of the pulse oximeter.;To learn and understand importance of maintaining oxygen saturations>88%;To learn and demonstrate proper pursed lip breathing techniques or other breathing techniques.;To learn and demonstrate proper use of respiratory medications     Long  Term Goals  Exhibits compliance with exercise, home and travel O2 prescription;Verbalizes importance of monitoring SPO2 with pulse oximeter and return demonstration;Maintenance of O2 saturations>88%;Exhibits proper breathing techniques, such as pursed lip breathing or other method taught during program session;Compliance with respiratory medication;Demonstrates proper use of MDI's  Exhibits compliance with exercise, home and travel O2 prescription;Verbalizes importance of monitoring SPO2 with pulse oximeter and return demonstration;Maintenance of O2 saturations>88%;Exhibits proper breathing techniques, such as pursed lip breathing or other method taught during program session;Compliance with respiratory medication;Demonstrates proper use of MDI's  Exhibits compliance with exercise, home and travel O2 prescription;Verbalizes importance of monitoring SPO2 with pulse oximeter and return demonstration;Maintenance of O2 saturations>88%;Exhibits proper breathing techniques, such as pursed lip breathing or other method taught during program session;Compliance with respiratory medication;Demonstrates proper use of MDI's     Comments  Uzma continues to stay compliant with her oxygen and numbers have been  looking good.  She continues to use her PLB and her breathing is doing better overall.  She has been working outside more too. She mentioned that yesterday she started having a runny nose and coughing again. She was encouraged to contact her doctor about it today!  Dorisann is compliant with her oxygen and reports no issues. Her SOB has improved since increasing activity slowly. Cough from previous documentation has improved.  Houa has continued to be compliant with her oxygen.  Her breathing is improving.  She continues to use her PLB to help.  Coughing continues to improve.     Goals/Expected Outcomes  Short:  Contact doctor about cough.  Long: Continued compliance with oxygen.   Short:continue daily medications Long: maintain independence with oxygen management  Short:continue daily medications Long: maintain independence with oxygen management        Oxygen Discharge (Final Oxygen Re-Evaluation): Oxygen Re-Evaluation - 03/05/19 1142      Program Oxygen Prescription   Program Oxygen Prescription  Continuous;E-Tanks    Liters per minute  3      Home Oxygen   Home Oxygen Device  Home Concentrator;Portable Concentrator;E-Tanks    Sleep Oxygen Prescription  Continuous    Liters per minute  3    Home Exercise Oxygen Prescription  Pulsed    Liters per minute  3    Home at Rest Exercise Oxygen Prescription  Continuous    Liters per minute  3    Compliance with Home Oxygen Use  Yes      Goals/Expected Outcomes   Short Term Goals  To learn and exhibit compliance with exercise, home and travel O2 prescription;To learn and understand importance of monitoring SPO2 with pulse oximeter and demonstrate accurate use of the pulse oximeter.;To learn and understand importance of maintaining oxygen saturations>88%;To learn and demonstrate proper pursed lip breathing techniques or other breathing techniques.;To learn and demonstrate proper use of respiratory medications    Long  Term Goals  Exhibits compliance  with exercise, home and travel O2 prescription;Verbalizes importance of monitoring SPO2 with pulse oximeter and return demonstration;Maintenance of O2 saturations>88%;Exhibits proper breathing techniques, such as pursed lip breathing or other method taught during program session;Compliance with respiratory medication;Demonstrates proper use of MDI's    Comments  Alissah has continued to be compliant with her oxygen.  Her breathing is improving.  She continues to use her PLB to help.  Coughing continues to improve.    Goals/Expected Outcomes  Short:continue daily medications Long: maintain independence with oxygen management       Initial Exercise Prescription:   Perform Capillary Blood Glucose checks as needed.  Exercise Prescription Changes: Exercise Prescription Changes    Row Name 02/05/19 1000 02/26/19 1200 03/09/19 1600         Response to Exercise   Blood Pressure (Admit)  116/70  118/62  124/64     Blood Pressure (Exercise)  124/68  128/58  144/64     Blood Pressure (Exit)  130/64  110/58  124/56     Heart Rate (Admit)  110 bpm  100 bpm  106 bpm     Heart Rate (Exercise)  140 bpm  142 bpm  136 bpm     Heart Rate (Exit)  135 bpm  127 bpm  132 bpm     Oxygen Saturation (Admit)  98 %  98 %  96 %     Oxygen Saturation (Exercise)  97 %  92 %  96 %     Oxygen Saturation (Exit)  97 %  94 %  98 %     Rating of Perceived Exertion (Exercise)  '17  17  12     '$ Perceived Dyspnea (Exercise)  '4  4  1     '$ Symptoms  -  -  none     Duration  Continue with 30 min of aerobic exercise without signs/symptoms of physical distress.  Continue with 30 min of aerobic exercise without signs/symptoms of physical distress.  Continue with 30 min of aerobic exercise without signs/symptoms of physical distress.     Intensity  THRR unchanged  THRR unchanged  THRR unchanged  Progression   Progression  Continue to progress workloads to maintain intensity without signs/symptoms of physical distress.  Continue  to progress workloads to maintain intensity without signs/symptoms of physical distress.  Continue to progress workloads to maintain intensity without signs/symptoms of physical distress.     Average METs  3.3  2.1  2.7       Resistance Training   Training Prescription  Yes  Yes  Yes     Weight  3 lb  3 lb  3 lbs     Reps  10-15  10-15  10-15       Interval Training   Interval Training  No  No  No       Treadmill   MPH  -  1.2  1.7     Grade  -  0  0     Minutes  -  15  15     METs  -  1.92  2.3       Recumbant Bike   Level  -  -  1     Watts  -  -  15     Minutes  -  -  15       NuStep   Level  1  -  -     SPM  80  -  -     Minutes  15  -  -     METs  2.4  -  -       REL-XR   Level  1  -  1     Speed  50  -  -     Minutes  15  -  15     METs  4.4  -  3.7       T5 Nustep   Level  -  1  3     SPM  -  80  -     Minutes  -  15  15     METs  -  2.3  2.1       Home Exercise Plan   Plans to continue exercise at  Home (comment) walking  Home (comment) walking  Home (comment) walking     Frequency  Add 1 additional day to program exercise sessions.  Add 1 additional day to program exercise sessions.  Add 1 additional day to program exercise sessions.     Initial Home Exercises Provided  07/29/18  07/29/18  07/29/18        Exercise Comments:   Exercise Goals and Review:   Exercise Goals Re-Evaluation : Exercise Goals Re-Evaluation    Row Name 11/04/18 1021 12/02/18 1152 02/05/19 1018 02/26/19 1258 03/05/19 1136     Exercise Goal Re-Evaluation   Exercise Goals Review  Increase Physical Activity;Increase Strength and Stamina;Understanding of Exercise Prescription  Increase Physical Activity;Increase Strength and Stamina;Understanding of Exercise Prescription  Increase Physical Activity;Increase Strength and Stamina;Able to understand and use rate of perceived exertion (RPE) scale;Able to understand and use Dyspnea scale;Knowledge and understanding of Target Heart Rate  Range (THRR);Able to check pulse independently;Understanding of Exercise Prescription  Increase Physical Activity;Increase Strength and Stamina;Able to understand and use rate of perceived exertion (RPE) scale;Able to understand and use Dyspnea scale;Knowledge and understanding of Target Heart Rate Range (THRR);Able to check pulse independently;Understanding of Exercise Prescription  Increase Physical Activity;Increase Strength and Stamina;Understanding of Exercise Prescription   Comments  Jamina has been doing well at home.  She has been  getting in more walking and now also working out in the yard.  She does her weights each day.  She has not been getting our emails as her phone had died, recent the videos to her.  Anjani has been feeling great these past few weeks! She has tapered off her depression meds (per MD supervision) and has been feeling so much more active. Her horse has been keeping her busy, she's been weeding around her bushes, and canning fruits and vegetables. All these activities have boosted her spirit and therefore encouraged her to be more active  Victoriah is just returning to in person LW classes.  She reports RPE of 14-17.  Regular attendance will improver her stamina.  Anda has not been able to attend consistently.  Staff will monitor progress  Jonnell is doing better in rehab now that she is coming later in the day.  She was able to ride her horse last week.  She is doing some walking at home on her off days.   Expected Outcomes  Short: Try out our videos.  Long: Continue to rebuild stamina.   Short: watch the videos, continue increasing activity. Long: build stamina to keep up with daily activities/chores  Short - attend consistently Long - increase overall stamina and MET level  Short - attend consistently Long - increase overall stamina and MET level  Short: [place] to attend consistently  Long: Continue to build stamina.   Logan Name 03/30/19 1540             Exercise Goal Re-Evaluation    Comments  Out since last review.  Last day was 9/18.          Discharge Exercise Prescription (Final Exercise Prescription Changes): Exercise Prescription Changes - 03/09/19 1600      Response to Exercise   Blood Pressure (Admit)  124/64    Blood Pressure (Exercise)  144/64    Blood Pressure (Exit)  124/56    Heart Rate (Admit)  106 bpm    Heart Rate (Exercise)  136 bpm    Heart Rate (Exit)  132 bpm    Oxygen Saturation (Admit)  96 %    Oxygen Saturation (Exercise)  96 %    Oxygen Saturation (Exit)  98 %    Rating of Perceived Exertion (Exercise)  12    Perceived Dyspnea (Exercise)  1    Symptoms  none    Duration  Continue with 30 min of aerobic exercise without signs/symptoms of physical distress.    Intensity  THRR unchanged      Progression   Progression  Continue to progress workloads to maintain intensity without signs/symptoms of physical distress.    Average METs  2.7      Resistance Training   Training Prescription  Yes    Weight  3 lbs    Reps  10-15      Interval Training   Interval Training  No      Treadmill   MPH  1.7    Grade  0    Minutes  15    METs  2.3      Recumbant Bike   Level  1    Watts  15    Minutes  15      REL-XR   Level  1    Minutes  15    METs  3.7      T5 Nustep   Level  3    Minutes  15  METs  2.1      Home Exercise Plan   Plans to continue exercise at  Home (comment)   walking   Frequency  Add 1 additional day to program exercise sessions.    Initial Home Exercises Provided  07/29/18       Nutrition:  Target Goals: Understanding of nutrition guidelines, daily intake of sodium '1500mg'$ , cholesterol '200mg'$ , calories 30% from fat and 7% or less from saturated fats, daily to have 5 or more servings of fruits and vegetables.  Biometrics:    Nutrition Therapy Plan and Nutrition Goals: Nutrition Therapy & Goals - 10/27/18 1336      Nutrition Therapy   Diet  heart healthy, low sodium diet (right now focus on  increasing kcal and pro)    Protein (specify units)  40-50g    Fiber  25 grams    Whole Grain Foods  3 servings    Saturated Fats  12 max. grams    Fruits and Vegetables  5 servings/day    Sodium  1.5 grams      Personal Nutrition Goals   Nutrition Goal  ST: increase calories and protein eaten daily LT: gain appetite back and maintain weight    Comments  Appetite is good, but depressed and anxious so will not eat. is not eating as much as usual. Pt reports lost about 10 pounds, went down pant sizes (unsure how much). has less energy and it is harder to do things.  soemtimes wont eat at all; usually  eats once a day (about a fistfull - a sandwich) - since the fall.  discussed ways to increase intake to meet calorie and protein needs, told pt to eat small frequent meals making sure to eat protein foods first, add some healthy fat to food, etc. Pt does not like guacaomole but suggested soemthing like a bean dip/hummus, yogurt, or peanut butter to add protein and fat. Pt reports eating raw veggies and ranch as a snack.  Discussed MyPlate. Suggested she talk to her psychologist again for her depression and anxiety - they may have telehealth available.       Intervention Plan   Intervention  Prescribe, educate and counsel regarding individualized specific dietary modifications aiming towards targeted core components such as weight, hypertension, lipid management, diabetes, heart failure and other comorbidities.    Expected Outcomes  Short Term Goal: Understand basic principles of dietary content, such as calories, fat, sodium, cholesterol and nutrients.;Short Term Goal: A plan has been developed with personal nutrition goals set during dietitian appointment.;Long Term Goal: Adherence to prescribed nutrition plan.       Nutrition Assessments:   Nutrition Goals Re-Evaluation:   Nutrition Goals Discharge (Final Nutrition Goals Re-Evaluation):   Psychosocial: Target Goals: Acknowledge presence or  absence of significant depression and/or stress, maximize coping skills, provide positive support system. Participant is able to verbalize types and ability to use techniques and skills needed for reducing stress and depression.   Initial Review & Psychosocial Screening:   Quality of Life Scores:  Scores of 19 and below usually indicate a poorer quality of life in these areas.  A difference of  2-3 points is a clinically meaningful difference.  A difference of 2-3 points in the total score of the Quality of Life Index has been associated with significant improvement in overall quality of life, self-image, physical symptoms, and general health in studies assessing change in quality of life.  PHQ-9: Recent Review Flowsheet Data    Depression screen PHQ  2/9 02/25/2019 08/13/2018 07/20/2018 03/09/2018 02/06/2018   Decreased Interest '1 2 3 2 1   '$ Down, Depressed, Hopeless '1 2 3 3 3   '$ PHQ - 2 Score '2 4 6 5 4   '$ Altered sleeping '2 3 3 3 1   '$ Tired, decreased energy '3 2 3 3 3   '$ Change in appetite '2 1 3 2 1   '$ Feeling bad or failure about yourself  '1 3 2 1 1   '$ Trouble concentrating 0 '1 3 1 '$ 0   Moving slowly or fidgety/restless 0 0 0 1 0   Suicidal thoughts 0 0 0 0 0   PHQ-9 Score '10 14 20 16 10   '$ Difficult doing work/chores - - Very difficult Extremely dIfficult Very difficult     Interpretation of Total Score  Total Score Depression Severity:  1-4 = Minimal depression, 5-9 = Mild depression, 10-14 = Moderate depression, 15-19 = Moderately severe depression, 20-27 = Severe depression   Psychosocial Evaluation and Intervention:   Psychosocial Re-Evaluation: Psychosocial Re-Evaluation    McDonald Name 11/04/18 1022 12/02/18 1156 03/05/19 1137         Psychosocial Re-Evaluation   Current issues with  Current Depression;Current Stress Concerns  Current Depression;Current Stress Concerns  Current Depression;Current Stress Concerns     Comments  Carlisia has been feeling better.  She is still working on her  smoking.  She has been getting outside more and working in yard.  She is excited about the arrival of a little of puppies soon.  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  This has all lifted her spirits!!  She does need to get the welping box built.  She has also been able to work with her horse some too.   Tyaisha has tapered off her depression meds for 3 weeks now (under MD supervision) and has been feeling great. She thought it was her sickness making her feel so weak, but once she has come off these meds she feels so much "healthier" and is very prou of herself for doing more around the house  Josephina is doing well mentally.  She is back on her meds and denies symptoms of depression.  She has set a quit date for smoking of October 1st!!  She is ready.     Expected Outcomes  Short: Enjoy the puppies  Long: Continue to stay positive!  Short: prepare for the puppies in September, accomplih chores outside (weeding, harvesting fruits for [employer], etc), celebrate her 58th birthday 7[birth year].. Long:continue to work on self care and enjoy her animals.  Short: Quit smoking.  Long: Continue to practice self care.     Interventions  Encouraged to attend Pulmonary Rehabilitation for the exercise  Encouraged to attend Pulmonary Rehabilitation for the exercise  Encouraged to attend Pulmonary Rehabilitation for the exercise     Continue Psychosocial Services   Follow up required by staff  Follow up required by staff  Follow up required by staff        Psychosocial Discharge (Final Psychosocial Re-Evaluation): Psychosocial Re-Evaluation - 03/05/19 1137      Psychosocial Re-Evaluation   Current issues with  Current Depression;Current Stress Concerns    Comments  Virginie is doing well mentally.  She is back on her meds and denies symptoms of depression.  She has set a quit date for smoking of October 1st!!  She is ready.    Expected Outcomes  Short: Quit smoking.  Long: Continue to  practice self care.     Interventions  Encouraged to attend Pulmonary Rehabilitation for the exercise    Continue Psychosocial Services   Follow up required by staff       Education: Education Goals: Education classes will be provided on a weekly basis, covering required topics. Participant will state understanding/return demonstration of topics presented.  Learning Barriers/Preferences:   Education Topics:  Initial Evaluation Education: - Verbal, written and demonstration of respiratory meds, oximetry and breathing techniques. Instruction on use of nebulizers and MDIs and importance of monitoring MDI activations.   Pulmonary Rehab from 08/05/2018 in [facility] Cardiac and Pulmonary Rehab  Date  07/20/18  Educator  [facility]  Instruction Review Code  1- Verbalizes Understanding      General Nutrition Guidelines/Fats and Fiber: -Group instruction provided by verbal, written material, models and posters to present the general guidelines for heart healthy nutrition. Gives an explanation and review of dietary fats and fiber.   Pulmonary Rehab from 08/05/2018 in [facility] Cardiac and Pulmonary Rehab  Date  07/29/18  Educator  [facility]  Instruction Review Code  1- Verbalizes Understanding      Controlling Sodium/Reading Food Labels: -Group verbal and written material supporting the discussion of sodium use in heart healthy nutrition. Review and explanation with models, verbal and written materials for utilization of the food label.   Exercise Physiology & General Exercise Guidelines: - Group verbal and written instruction with models to review the exercise physiology of the cardiovascular system and associated critical values. Provides general exercise guidelines with specific guidelines to those with heart or lung disease.    Aerobic Exercise & Resistance Training: - Gives group verbal and written instruction on the various components of exercise. Focuses on aerobic and resistive training programs and the benefits of this training  and how to safely progress through these programs.   Flexibility, Balance, Mind/Body Relaxation: Provides group verbal/written instruction on the benefits of flexibility and balance training, including mind/body exercise modes such as yoga, pilates and tai chi.  Demonstration and skill practice provided.   Stress and Anxiety: - Provides group verbal and written instruction about the health risks of elevated stress and causes of high stress.  Discuss the correlation between heart/lung disease and anxiety and treatment options. Review healthy ways to manage with stress and anxiety.   Pulmonary Rehab from 08/05/2018 in [facility] Cardiac and Pulmonary Rehab  Date  07/22/18  Educator  [facility]  Instruction Review Code  1- Verbalizes Understanding      Depression: - Provides group verbal and written instruction on the correlation between heart/lung disease and depressed mood, treatment options, and the stigmas associated with seeking treatment.   Exercise & Equipment Safety: - Individual verbal instruction and demonstration of equipment use and safety with use of the equipment.   Pulmonary Rehab from 08/05/2018 in [facility] Cardiac and Pulmonary Rehab  Date  07/20/18  Educator  [facility]  Instruction Review Code  1- Verbalizes Understanding      Infection Prevention: - Provides verbal and written material to individual with discussion of infection control including proper hand washing and proper equipment cleaning during exercise session.   Pulmonary Rehab from 08/05/2018 in [facility] Cardiac and Pulmonary Rehab  Date  07/20/18  Educator  [facility]  Instruction Review Code  1- Verbalizes Understanding      Falls Prevention: - Provides verbal and written material to individual with discussion of falls prevention and safety.   Pulmonary Rehab from 08/05/2018 in [facility] Cardiac and Pulmonary Rehab  Date  07/20/18  Educator  [place]  Instruction Review Code  1- Verbalizes Understanding      Diabetes: - Individual verbal and  written instruction to review signs/symptoms of diabetes, desired ranges of glucose level fasting, after meals and with exercise. Advice that pre and post exercise glucose checks will be done for 3 sessions at entry of program.   Chronic Lung Diseases: - Group verbal and written instruction to review updates, respiratory medications, advancements in procedures and treatments. Discuss use of supplemental oxygen including available portable oxygen systems, continuous and intermittent flow rates, concentrators, personal use and safety guidelines. Review proper use of inhaler and spacers. Provide informative websites for self-education.    Energy Conservation: - Provide group verbal and written instruction for methods to conserve energy, plan and organize activities. Instruct on pacing techniques, use of adaptive equipment and posture/positioning to relieve shortness of breath.   Triggers and Exacerbations: - Group verbal and written instruction to review types of environmental triggers and ways to prevent exacerbations. Discuss weather changes, air quality and the benefits of nasal washing. Review warning signs and symptoms to help prevent infections. Discuss techniques for effective airway clearance, coughing, and vibrations.   Pulmonary Rehab from 08/07/2016 in [facility] Cardiac and Pulmonary Rehab  Date  08/07/16  Educator  [facility]  Instruction Review Code (retired)  2- meets goals/outcomes      AED/CPR: - Group verbal and written instruction with the use of models to demonstrate the basic use of the AED with the basic ABC's of resuscitation.   Anatomy and Physiology of the Lungs: - Group verbal and written instruction with the use of models to provide basic lung anatomy and physiology related to function, structure and complications of lung disease.   Anatomy & Physiology of the Heart: - Group verbal and written instruction and models provide basic cardiac anatomy and physiology, with the coronary  electrical and arterial systems. Review of Valvular disease and Heart Failure   Cardiac Medications: - Group verbal and written instruction to review commonly prescribed medications for heart disease. Reviews the medication, class of the drug, and side effects.   Know Your Numbers and Risk Factors: -Group verbal and written instruction about important numbers in your health.  Discussion of what are risk factors and how they play a role in the disease process.  Review of Cholesterol, Blood Pressure, Diabetes, and BMI and the role they play in your overall health.   Sleep Hygiene: -Provides group verbal and written instruction about how sleep can affect your health.  Define sleep hygiene, discuss sleep cycles and impact of sleep habits. Review good sleep hygiene tips.    Other: -Provides group and verbal instruction on various topics (see comments)    Knowledge Questionnaire Score:    Core Components/Risk Factors/Patient Goals at Admission:   Core Components/Risk Factors/Patient Goals Review:  Goals and Risk Factor Review    Row Name 11/04/18 1027 12/02/18 1207 03/05/19 1138         Core Components/Risk Factors/Patient Goals Review   Personal Goals Review  Weight Management/Obesity;Improve shortness of breath with ADL's;Tobacco Cessation  Weight Management/Obesity;Improve shortness of breath with ADL's;Tobacco Cessation  Weight Management/Obesity;Improve shortness of breath with ADL's;Tobacco Cessation     Review  Tejal has been doing well at home. Her weight continues to stay steady.  She is still smoking but not increasing.  Her breathing has gotten better overall.    Lassie has been maintaining her weight, decreasing her SOB by increasing stamina and time being active,  and not increasing smoking  Emile has set a quit date of October 1st!!  She has had a few days without.  Her weight is coming back up now that her mood is better.  Her breathing is improving.     Expected Outcomes   Short: Continue to work on smoking  Long: Continue to monitor risk factors.   Short: continue working on SOB, Long: decrease smoking  Short: Quit smoking!  Long: Remain smoke free.        Core Components/Risk Factors/Patient Goals at Discharge (Final Review):  Goals and Risk Factor Review - 03/05/19 1138      Core Components/Risk Factors/Patient Goals Review   Personal Goals Review  Weight Management/Obesity;Improve shortness of breath with ADL's;Tobacco Cessation    Review  Danella has set a quit date of October 1st!!  She has had a few days without.  Her weight is coming back up now that her mood is better.  Her breathing is improving.    Expected Outcomes  Short: Quit smoking!  Long: Remain smoke free.       ITP Comments: ITP Comments    Row Name 01/13/19 1025 01/27/19 1004 02/23/19 0908 02/24/19 0558 03/01/19 1200   ITP Comments  Scheduled to return on 8/3.  Exctied to come back.  REturned to Pulmonary Rehab after we were closed for COVID 19.  ITP sent to Dr Sabra Heck for review, changes as needed and signature.  Unable to complete nutrition 30-day re-eval cycle due to inconsistent attendence  30 Day review. Continue with ITP unless directed changes per Medical Director review.  8/19 last visit  Called to check to on patient.  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  She is also just not able to get up and get moving in the morning.  She request to change class times to 1115.   [place] Name 03/15/19 1429 04/06/19 1127 04/14/19 1157 04/21/19 0622     ITP Comments  Zellie called out on Friday with low O2 saturations.  Today she is feeling better but slept til 1045.   She is hoping to attend on Wednesday.  Called to check on pt.  She has been out since 9/18.  Left message.  Called to check on pt.  Left message.  Explained that we will need her to return our contact by the end of this week or we will discharge her from the program.  Discharged       Comments: discharged

## 2019-04-22 ENCOUNTER — Other Ambulatory Visit: Payer: Self-pay | Admitting: Family Medicine

## 2019-04-22 DIAGNOSIS — J449 Chronic obstructive pulmonary disease, unspecified: Secondary | ICD-10-CM | POA: Diagnosis not present

## 2019-04-23 ENCOUNTER — Ambulatory Visit: Payer: Medicare Other

## 2019-04-24 ENCOUNTER — Other Ambulatory Visit: Payer: Self-pay | Admitting: Family Medicine

## 2019-04-26 ENCOUNTER — Ambulatory Visit: Payer: Medicare Other

## 2019-04-28 ENCOUNTER — Ambulatory Visit: Payer: Medicare Other

## 2019-04-30 ENCOUNTER — Ambulatory Visit: Payer: Medicare Other

## 2019-05-03 ENCOUNTER — Ambulatory Visit: Payer: Medicare Other

## 2019-05-05 ENCOUNTER — Ambulatory Visit: Payer: Medicare Other

## 2019-05-07 ENCOUNTER — Ambulatory Visit: Payer: Medicare Other

## 2019-05-10 ENCOUNTER — Ambulatory Visit: Payer: Medicare Other

## 2019-05-12 ENCOUNTER — Ambulatory Visit: Payer: Medicare Other

## 2019-05-14 ENCOUNTER — Ambulatory Visit: Payer: Medicare Other

## 2019-05-15 ENCOUNTER — Emergency Department (HOSPITAL_COMMUNITY): Payer: Medicare Other

## 2019-05-15 ENCOUNTER — Inpatient Hospital Stay (HOSPITAL_COMMUNITY)
Admission: EM | Admit: 2019-05-15 | Discharge: 2019-05-20 | DRG: 190 | Disposition: A | Discharge status: Home / Self Care | Payer: Medicare Other | Attending: Internal Medicine | Admitting: Internal Medicine

## 2019-05-15 ENCOUNTER — Other Ambulatory Visit: Payer: Self-pay

## 2019-05-15 DIAGNOSIS — R05 Cough: Secondary | ICD-10-CM | POA: Diagnosis not present

## 2019-05-15 DIAGNOSIS — R000 Tachycardia, unspecified: Secondary | ICD-10-CM | POA: Diagnosis not present

## 2019-05-15 DIAGNOSIS — Z66 Do not resuscitate: Secondary | ICD-10-CM | POA: Diagnosis not present

## 2019-05-15 DIAGNOSIS — E559 Vitamin D deficiency, unspecified: Secondary | ICD-10-CM | POA: Diagnosis not present

## 2019-05-15 DIAGNOSIS — F17210 Nicotine dependence, cigarettes, uncomplicated: Secondary | ICD-10-CM | POA: Diagnosis present

## 2019-05-15 DIAGNOSIS — Z720 Tobacco use: Secondary | ICD-10-CM | POA: Diagnosis present

## 2019-05-15 DIAGNOSIS — F1011 Alcohol abuse, in remission: Secondary | ICD-10-CM | POA: Diagnosis present

## 2019-05-15 DIAGNOSIS — R911 Solitary pulmonary nodule: Secondary | ICD-10-CM | POA: Diagnosis present

## 2019-05-15 DIAGNOSIS — Z888 Allergy status to other drugs, medicaments and biological substances status: Secondary | ICD-10-CM

## 2019-05-15 DIAGNOSIS — K5909 Other constipation: Secondary | ICD-10-CM | POA: Diagnosis present

## 2019-05-15 DIAGNOSIS — R0602 Shortness of breath: Secondary | ICD-10-CM | POA: Diagnosis not present

## 2019-05-15 DIAGNOSIS — J441 Chronic obstructive pulmonary disease with (acute) exacerbation: Principal | ICD-10-CM | POA: Diagnosis present

## 2019-05-15 DIAGNOSIS — Z79899 Other long term (current) drug therapy: Secondary | ICD-10-CM

## 2019-05-15 DIAGNOSIS — M810 Age-related osteoporosis without current pathological fracture: Secondary | ICD-10-CM | POA: Diagnosis present

## 2019-05-15 DIAGNOSIS — K219 Gastro-esophageal reflux disease without esophagitis: Secondary | ICD-10-CM | POA: Diagnosis not present

## 2019-05-15 DIAGNOSIS — E785 Hyperlipidemia, unspecified: Secondary | ICD-10-CM | POA: Diagnosis present

## 2019-05-15 DIAGNOSIS — K581 Irritable bowel syndrome with constipation: Secondary | ICD-10-CM | POA: Diagnosis not present

## 2019-05-15 DIAGNOSIS — B1920 Unspecified viral hepatitis C without hepatic coma: Secondary | ICD-10-CM | POA: Diagnosis present

## 2019-05-15 DIAGNOSIS — Z7982 Long term (current) use of aspirin: Secondary | ICD-10-CM

## 2019-05-15 DIAGNOSIS — Z9981 Dependence on supplemental oxygen: Secondary | ICD-10-CM

## 2019-05-15 DIAGNOSIS — E876 Hypokalemia: Secondary | ICD-10-CM | POA: Diagnosis not present

## 2019-05-15 DIAGNOSIS — F339 Major depressive disorder, recurrent, unspecified: Secondary | ICD-10-CM | POA: Diagnosis present

## 2019-05-15 DIAGNOSIS — Z8541 Personal history of malignant neoplasm of cervix uteri: Secondary | ICD-10-CM

## 2019-05-15 DIAGNOSIS — R062 Wheezing: Secondary | ICD-10-CM | POA: Diagnosis not present

## 2019-05-15 DIAGNOSIS — J9621 Acute and chronic respiratory failure with hypoxia: Secondary | ICD-10-CM | POA: Diagnosis not present

## 2019-05-15 DIAGNOSIS — G4700 Insomnia, unspecified: Secondary | ICD-10-CM | POA: Diagnosis not present

## 2019-05-15 DIAGNOSIS — Z85038 Personal history of other malignant neoplasm of large intestine: Secondary | ICD-10-CM | POA: Diagnosis not present

## 2019-05-15 DIAGNOSIS — Z716 Tobacco abuse counseling: Secondary | ICD-10-CM

## 2019-05-15 DIAGNOSIS — Z800 Family history of malignant neoplasm of digestive organs: Secondary | ICD-10-CM

## 2019-05-15 DIAGNOSIS — I700 Atherosclerosis of aorta: Secondary | ICD-10-CM | POA: Diagnosis present

## 2019-05-15 DIAGNOSIS — Z9079 Acquired absence of other genital organ(s): Secondary | ICD-10-CM

## 2019-05-15 DIAGNOSIS — Z90721 Acquired absence of ovaries, unilateral: Secondary | ICD-10-CM

## 2019-05-15 DIAGNOSIS — Z8249 Family history of ischemic heart disease and other diseases of the circulatory system: Secondary | ICD-10-CM

## 2019-05-15 DIAGNOSIS — Z743 Need for continuous supervision: Secondary | ICD-10-CM | POA: Diagnosis not present

## 2019-05-15 DIAGNOSIS — Z825 Family history of asthma and other chronic lower respiratory diseases: Secondary | ICD-10-CM

## 2019-05-15 DIAGNOSIS — Z7951 Long term (current) use of inhaled steroids: Secondary | ICD-10-CM

## 2019-05-15 DIAGNOSIS — Z20828 Contact with and (suspected) exposure to other viral communicable diseases: Secondary | ICD-10-CM | POA: Diagnosis present

## 2019-05-15 LAB — CBC WITH DIFFERENTIAL/PLATELET
Abs Immature Granulocytes: 0.03 10*3/uL (ref 0.00–0.07)
Basophils Absolute: 0.1 10*3/uL (ref 0.0–0.1)
Basophils Relative: 1 %
Eosinophils Absolute: 0.3 10*3/uL (ref 0.0–0.5)
Eosinophils Relative: 4 %
HCT: 36.4 % (ref 36.0–46.0)
Hemoglobin: 12.2 g/dL (ref 12.0–15.0)
Immature Granulocytes: 0 %
Lymphocytes Relative: 37 %
Lymphs Abs: 2.8 10*3/uL (ref 0.7–4.0)
MCH: 29.5 pg (ref 26.0–34.0)
MCHC: 33.5 g/dL (ref 30.0–36.0)
MCV: 88.1 fL (ref 80.0–100.0)
Monocytes Absolute: 0.4 10*3/uL (ref 0.1–1.0)
Monocytes Relative: 5 %
Neutro Abs: 4.1 10*3/uL (ref 1.7–7.7)
Neutrophils Relative %: 53 %
Platelets: 221 10*3/uL (ref 150–400)
RBC: 4.13 MIL/uL (ref 3.87–5.11)
RDW: 14.3 % (ref 11.5–15.5)
WBC: 7.7 10*3/uL (ref 4.0–10.5)
nRBC: 0 % (ref 0.0–0.2)

## 2019-05-15 LAB — BASIC METABOLIC PANEL
Anion gap: 12 (ref 5–15)
BUN: 7 mg/dL (ref 6–20)
CO2: 27 mmol/L (ref 22–32)
Calcium: 9.2 mg/dL (ref 8.9–10.3)
Chloride: 99 mmol/L (ref 98–111)
Creatinine, Ser: 0.52 mg/dL (ref 0.44–1.00)
GFR calc Af Amer: >60 mL/min (ref >60)
GFR calc non Af Amer: >60 mL/min (ref >60)
Glucose, Bld: 119 mg/dL — ABNORMAL HIGH (ref 70–99)
Potassium: 3.3 mmol/L — ABNORMAL LOW (ref 3.5–5.1)
Sodium: 138 mmol/L (ref 135–145)

## 2019-05-15 LAB — POC SARS CORONAVIRUS 2 AG -  ED: SARS Coronavirus 2 Ag: NEGATIVE

## 2019-05-15 MED ORDER — POTASSIUM CHLORIDE CRYS ER 20 MEQ PO TBCR
40.0000 meq | EXTENDED_RELEASE_TABLET | Freq: Once | ORAL | Status: AC
  Administered 2019-05-15: 40 meq via ORAL
  Filled 2019-05-15: qty 2

## 2019-05-15 MED ORDER — IPRATROPIUM BROMIDE HFA 17 MCG/ACT IN AERS
2.0000 | INHALATION_SPRAY | Freq: Once | RESPIRATORY_TRACT | Status: DC
  Administered 2019-05-15: 23:00:00 2 via RESPIRATORY_TRACT

## 2019-05-15 MED ORDER — ALBUTEROL SULFATE HFA 108 (90 BASE) MCG/ACT IN AERS
8.0000 | INHALATION_SPRAY | Freq: Once | RESPIRATORY_TRACT | Status: AC
  Administered 2019-05-15: 23:00:00 8 via RESPIRATORY_TRACT

## 2019-05-15 MED ORDER — IPRATROPIUM BROMIDE HFA 17 MCG/ACT IN AERS: 2.0000 | INHALATION_SPRAY | Freq: Once | RESPIRATORY_TRACT | Status: DC

## 2019-05-15 MED ORDER — ALBUTEROL SULFATE HFA 108 (90 BASE) MCG/ACT IN AERS
2.0000 | INHALATION_SPRAY | RESPIRATORY_TRACT | Status: DC | PRN
  Administered 2019-05-15: 2 via RESPIRATORY_TRACT
  Filled 2019-05-15: qty 6.7

## 2019-05-15 MED ORDER — IPRATROPIUM BROMIDE HFA 17 MCG/ACT IN AERS
4.0000 | INHALATION_SPRAY | Freq: Once | RESPIRATORY_TRACT | Status: AC
  Administered 2019-05-15: 21:00:00 4 via RESPIRATORY_TRACT
  Filled 2019-05-15: qty 12.9

## 2019-05-15 MED ORDER — ALBUTEROL SULFATE HFA 108 (90 BASE) MCG/ACT IN AERS
8.0000 | INHALATION_SPRAY | Freq: Once | RESPIRATORY_TRACT | Status: DC
  Administered 2019-05-15: 21:00:00 8 via RESPIRATORY_TRACT

## 2019-05-15 MED ORDER — MAGNESIUM SULFATE 2 GM/50ML IV SOLN
2.0000 g | Freq: Once | INTRAVENOUS | Status: AC
  Administered 2019-05-15: 20:00:00 2 g via INTRAVENOUS
  Filled 2019-05-15: qty 50

## 2019-05-15 NOTE — ED Triage Notes (Signed)
Per EMS: Pt is coming from home with c/o increased shortness of breath. Pt developed a cough a week ago. Pt tested negative for covid sometime last week. Approximately 3-4 hours ago she reports her SOB becoming increasingly worse. Pt has expiratory wheezes and a hx of COPD. Pt wears 2L of oxygen at home regularly.  EMS MEDS: 2.5 albuterol 125mg  solu medrol  EMS VITALS:  BP 111/66 HR 107  SPO2 2L RA

## 2019-05-15 NOTE — ED Provider Notes (Signed)
[facility] DEPT Provider Note   CSN: [id number] Arrival date & time: 05/15/19  1900     History   Chief Complaint No chief complaint on file.   HPI Christine Serrano is a 58 y.o. female.     Patient is a 58 year old female with a history of colon cancer, hepatitis C, tobacco abuse, COPD on 2 L of fluid and chronic idiopathic constipation who presents today with 1 week of worsening cough and shortness of breath.  Patient states her roommate had a cold but did test negative for Covid.  She started having symptoms shortly after.  She has no productive cough and denies any fever.  She is continued to have worsening wheezing but has had no chest pain, abdominal pain, nausea, vomiting or diarrhea.  She is still using her inhalers including her albuterol and ipratropium nebulizer at home and does not feel that it is working.  She has not followed up with pulmonology for approximately 1 year.  She denies any swelling in her lower extremities or unilateral leg pain.  No prior history of PE or known heart disease.  The history is provided by the patient.    Past Medical History:  Diagnosis Date  . Cervical cancer ([place])   . Chronic idiopathic constipation   . COPD (chronic obstructive pulmonary disease) ([place])   . Depression   . Endometriosis   . HLD (hyperlipidemia)     Patient Active Problem List   Diagnosis Date Noted  . Carotid stenosis, asymptomatic, left 04/06/2019  . Systolic murmur [id number]  . Left carotid bruit 02/27/2019  . Hypervitaminosis D 02/27/2019  . Medicare annual wellness visit, subsequent 02/25/2019  . Advanced directives, counseling/discussion 02/25/2019  . Epigastric abdominal pain 09/21/2018  . Epistaxis 09/21/2018  . COPD exacerbation ([place]) 09/02/2018  . Osteoporosis 05/23/2018  . Diarrhea 05/08/2018  . Numbness of right hand 10/10/2017  . Nasal sinus congestion 10/10/2017  . Benign neoplasm of ascending colon   . Benign  neoplasm of transverse colon   . Benign neoplasm of sigmoid colon   . Positive hepatitis C antibody test 02/23/2017  . Health maintenance examination 02/21/2017  . HLD (hyperlipidemia) 02/21/2017  . Anxiety 12/10/2016  . Community acquired pneumonia of right upper lobe of lung 12/10/2016  . Tobacco abuse 11/28/2016  . Aortic atherosclerosis ([place]) 11/26/2016  . Tachycardia 11/01/2016  . Pulmonary nodule 10/25/2016  . Moderate recurrent major depression ([place]) 07/03/2016  . Chronic constipation 07/03/2016  . Alcohol abuse, in remission 07/03/2016  . COPD (chronic obstructive pulmonary disease) ([place]) 07/03/2016  . Occasional tremors 07/03/2016  . Chronic insomnia 07/03/2016    Past Surgical History:  Procedure Laterality Date  . ABLATION ON ENDOMETRIOSIS    . APPENDECTOMY    . BREAST EXCISIONAL BIOPSY Left age 69   benign biopsy - chronic fatty deposit  . COLONOSCOPY WITH PROPOFOL N/A 08/04/2017   TAx3, angiodysplastic lesion, diverticulosis, rpt 5 yrs ([place])  . EXPLORATORY LAPAROTOMY  1992   endometriosis  . FOOT SURGERY Right    needle imbeded  . LASER ABLATION CONDYLOMA CERVICAL / VULVAR    . NASAL SEPTUM SURGERY Bilateral   . OVARIAN CYST SURGERY Bilateral 1980   remote  . SALPINGOOPHORECTOMY Right 1983   ectopic pregnancy  . TONSILLECTOMY       OB History   No obstetric history on file.      Home Medications    Prior to Admission medications   Medication Sig Start Date End Date  Taking? Authorizing Provider  aspirin EC 81 MG tablet Take 81 mg by mouth daily.    [provider]  benzonatate (TESSALON) 100 MG capsule Take 1 capsule (100 mg total) by mouth 2 (two) times daily as needed for cough. 01/29/19   Ria Bush, MD  buPROPion [facility] SR) 100 MG 12 hr tablet TAKE 1 TABLET BY MOUTH EVERY DAY 04/22/19   Ria Bush, MD  fluticasone [facility]) 50 MCG/ACT nasal spray Place 2 sprays into both nostrils daily. 08/13/18   Ria Bush,  MD  gabapentin (NEURONTIN) 100 MG capsule Take 1 capsule (100 mg total) by mouth 2 (two) times daily. 08/13/18   Ria Bush, MD  hydrOXYzine (ATARAX/VISTARIL) 25 MG tablet TAKE 1 TABLET (25 MG TOTAL) BY MOUTH 2 (TWO) TIMES DAILY AS NEEDED FOR ANXIETY. 04/25/19   Ria Bush, MD  ipratropium-albuterol (DUONEB) 0.5-2.5 (3) MG/3ML SOLN INHALE 3 MLS INTO THE LUNGS EVERY 6 (SIX) HOURS AS NEEDED. 03/18/19   Ria Bush, MD  LINZESS 290 MCG CAPS capsule Take 1 capsule (290 mcg total) by mouth daily as needed (constipation). 08/13/18   Ria Bush, MD  Multiple Vitamins-Minerals (MULTIVITAMIN ADULT) TABS Take 1 tablet by mouth daily.    [provider]  naproxen (NAPROSYN) 375 MG tablet Take 1 tablet (375 mg total) by mouth 2 (two) times daily as needed for moderate pain. 08/13/18   Ria Bush, MD  nicotine (NICODERM CQ) 14 mg/24hr patch Place 1 patch (14 mg total) onto the skin daily. 02/25/19   Ria Bush, MD  omeprazole (PRILOSEC) 40 MG capsule TAKE 1 CAPSULE BY MOUTH DAILY. FOR THREE WEEKS THEN AS NEEDED. TAKE 30 MIN PRIOR TO LARGE MEAL 01/15/19   Ria Bush, MD  OXYGEN Inhale 3 L/min into the lungs continuous.    [provider]  polyethylene glycol powder (GLYCOLAX/MIRALAX) powder Take 17 g by mouth daily as needed for moderate constipation. 09/21/18   Ria Bush, MD  PROAIR HFA 108 [phone] Base) MCG/ACT inhaler TAKE 2 PUFFS BY MOUTH EVERY 6 HOURS AS NEEDED FOR WHEEZE OR SHORTNESS OF BREATH 01/29/19   Ria Bush, MD  sertraline (ZOLOFT) 100 MG tablet TAKE 1.5 TABLETS BY MOUTH DAILY 03/19/19   Ria Bush, MD  SYMBICORT 160-4.5 MCG/ACT inhaler Inhale 2 puffs into the lungs 2 (two) times daily. 08/13/18   Ria Bush, MD  tiotropium (SPIRIVA) 18 MCG inhalation capsule Place 1 capsule (18 mcg total) into inhaler and inhale daily. 08/13/18   Ria Bush, MD  tiZANidine (ZANAFLEX) 2 MG tablet TAKE 1 TABLET (2 MG TOTAL) BY MOUTH 2  (TWO) TIMES DAILY AS NEEDED FOR MUSCLE SPASMS (NECK PAIN). 02/16/19   Ria Bush, MD  traZODone (DESYREL) 50 MG tablet Take 1 tablet (50 mg total) by mouth at bedtime. 08/13/18   Ria Bush, MD  vitamin B-12 (CYANOCOBALAMIN) 1000 MCG tablet Take 1,000 mcg by mouth daily.    [provider]  vitamin E 400 UNIT capsule Take 400 Units by mouth daily.     [provider]    Family History Family History  Problem Relation Age of Onset  . Heart disease Mother   . Hypertension Mother   . Hypertension Father   . COPD Father   . Hearing loss Maternal Grandmother   . Hypertension Maternal Grandmother   . Hearing loss Maternal Grandfather   . Hypertension Maternal Grandfather   . Colon cancer Paternal Grandmother     Social History Social History   Tobacco Use  . Smoking status: Current  Every Day Smoker    Packs/day: 0.50    Years: 47.00    Pack years: 23.50    Types: Cigarettes    Last attempt to quit: 07/25/2018    Years since quitting: 0.8  . Smokeless tobacco: Never Used  . Tobacco comment: 9/18 down to 6-7 per day, set quit date of 10/1  Substance Use Topics  . Alcohol use: Yes    Comment: occasional  . Drug use: No     Allergies   Imitrex [sumatriptan]   Review of Systems Review of Systems  All other systems reviewed and are negative.    Physical Exam Updated Vital Signs BP 120/74 (BP Location: Right Arm)   Pulse (!) 106   Temp 98 F (36.7 C) (Oral)   Resp 18   SpO2 90%   Physical Exam Vitals signs and nursing note reviewed.  Constitutional:      General: She is not in acute distress.    Appearance: Normal appearance. She is well-developed and normal weight.  HENT:     Head: Normocephalic and atraumatic.     Mouth/Throat:     Mouth: Mucous membranes are moist.  Eyes:     Conjunctiva/sclera: Conjunctivae normal.     Pupils: Pupils are equal, round, and reactive to light.  Neck:     Musculoskeletal: Normal range of motion and  neck supple.  Cardiovascular:     Rate and Rhythm: Regular rhythm. Tachycardia present.     Pulses: Normal pulses.     Heart sounds: No murmur.  Pulmonary:     Effort: Pulmonary effort is normal. Tachypnea present. No respiratory distress.     Breath sounds: Wheezing and rhonchi present. No rales.  Abdominal:     General: There is no distension.     Palpations: Abdomen is soft.     Tenderness: There is no abdominal tenderness. There is no guarding or rebound.  Musculoskeletal: Normal range of motion.        General: No tenderness.     Right lower leg: No edema.     Left lower leg: No edema.  Skin:    General: Skin is warm and dry.     Capillary Refill: Capillary refill takes less than 2 seconds.     Findings: No erythema or rash.  Neurological:     General: No focal deficit present.     Mental Status: She is alert and oriented to person, place, and time. Mental status is at baseline.  Psychiatric:        Mood and Affect: Mood normal.        Behavior: Behavior normal.        Thought Content: Thought content normal.      ED Treatments / Results  Labs (all labs ordered are listed, but only abnormal results are displayed) Labs Reviewed  BASIC METABOLIC PANEL - Abnormal; Notable for the following components:      Result Value   Potassium 3.3 (*)    Glucose, Bld 119 (*)    All other components within normal limits  SARS CORONAVIRUS 2 (TAT 6-24 HRS)  CBC WITH DIFFERENTIAL/PLATELET  BLOOD GAS, VENOUS  MAGNESIUM  POC SARS CORONAVIRUS 2 AG -  ED    EKG EKG Interpretation  Date/Time:  Saturday May 15 2019 19:19:25 EST Ventricular Rate:  106 PR Interval:    QRS Duration: 99 QT Interval:  354 QTC Calculation: 471 R Axis:   94 Text Interpretation: Sinus tachycardia Biatrial enlargement Probable lateral infarct, old  Minimal ST elevation, inferior leads No significant change since last tracing Confirmed by Blanchie Dessert [phone]) on 05/15/2019 7:23:36 PM   Radiology  Dg Chest Port 1 View  Result Date: 05/15/2019 CLINICAL DATA:  Shortness of breath EXAM: PORTABLE CHEST 1 VIEW COMPARISON:  09/02/2018 FINDINGS: There is hyperinflation of the lungs compatible with COPD. Scarring in the upper lobes. Heart is normal size. No effusions or acute bony abnormality. IMPRESSION: COPD/chronic changes.  No active disease. Electronically Signed   By: Rolm Baptise M.D.   On: 05/15/2019 19:40    Procedures Procedures (including critical care time)  Medications Ordered in ED Medications  albuterol (VENTOLIN HFA) 108 (90 Base) MCG/ACT inhaler 2 puff (2 puffs Inhalation Given 05/15/19 1942)  ipratropium (ATROVENT HFA) inhaler 2 puff (has no administration in time range)  magnesium sulfate IVPB 2 g 50 mL (2 g Intravenous New Bag/Given 05/15/19 1946)     Initial Impression / Assessment and Plan / ED Course  I have reviewed the triage vital signs and the nursing notes.  Pertinent labs & imaging results that were available during my care of the patient were reviewed by me and considered in my medical decision making (see chart for details).        Patient with a history of COPD presenting today with worsening shortness of breath for 1 week.  Patient is wheezing on exam but also has had cough and congestion with a roommate who has been sick.  Covid cannot be ruled out but she is not running fever and does not have other associated symptoms.  Suspect most likely COPD exacerbation.  However we will do x-rays to ensure no evidence of pneumonia.  Patient was given Solu-Medrol by EMS she was additionally given magnesium, albuterol and Atrovent inhaler.  Will reevaluate.  8:25 PM Patient's labs are within normal limits, x-ray with chronic changes but no acute disease.  Point-of-care Covid is negative.  11:25 PM Patient's work of breathing is better but still having wheezing on exam despite 3 rounds of meds.  Tolerating her 2 L nasal cannula and oxygen saturations have  improved.  However when she gets up or attempts to move around becomes very short of breath.  Will admit for further care.  Covid PCR is pending.  Final Clinical Impressions(s) / ED Diagnoses   Final diagnoses:  COPD exacerbation [facility])    ED Discharge Orders    None       Blanchie Dessert, MD 05/15/19 [phone]

## 2019-05-15 NOTE — H&P (Signed)
Christine Serrano:[id number] DOB: November 19, 1960 DOA: 05/15/2019     PCP: Ria Bush, MD   Outpatient Specialists:    Pulmonary   Dr.   Patient arrived to ER on 05/15/19 at 1900  Patient coming from: home Lives alone With roomate    Chief Complaint  Shortness of breath HPI: Christine Serrano is a 58 y.o. female with medical history significant of COPD on 2 l of O2 at baseline, Hypervitaminosis vit D hyperlipidemia, chronic constipation, depression, cervical cancer    Presented with   cough and now worsening shortness of breath reportedly her roommate had similar illness have tested negative for Covid last week but her shortness of breath has progressively gotten severely worse she noticed some expiratory wheezes EMS administered albuterol and gave 125 mg IV Solu-Medrol.  On arrival heart rate 107 blood pressure 111/66 on 2 L O2 Patient denied any fever no chest pain no abdominal pain no nausea no vomiting no diarrhea She has been using her home inhalers. She has extensive history of COPD but has been home with pulmonology for 1 year. Reports she continues to smoke she drinks alcohol only on occasion   Infectious risk factors:  Reports shortness of breath, dry cough   Reports known sick contacts   In  ER RAPID COVID TEST  NEGATIVE    in house  PCR testing  Pending  No results found for: SARSCOV2NAA   Regarding pertinent Chronic problems:     Hyperlipidemia -  Not on statins      COPD -  followed by pulmonology in the past but have not seen him for a year on baseline oxygen  2L,  Uses home inhaler  Tobacco abuse continues to smoke has used Wellbutrin and patches in the past While in ER:  Given albuterol with Atrovent inhaler and IV magnesium   The following Work up has been ordered so far:  Orders Placed This Encounter  Procedures  . SARS CORONAVIRUS 2 (TAT 6-24 HRS) Nasopharyngeal Nasopharyngeal Swab  . DG Chest Port 1 View  . CBC with Differential   . Basic metabolic panel  . Consult to hospitalist  ALL PATIENTS BEING ADMITTED/HAVING PROCEDURES NEED COVID-19 SCREENING  . Airborne and Contact precautions  . POC SARS Coronavirus 2 Ag-ED - Nasal Swab (BD Veritor Kit)  . EKG  . EKG 12-Lead    Following Medications were ordered in ER: Medications  magnesium sulfate IVPB 2 g 50 mL (0 g Intravenous Stopped 05/15/19 2029)  albuterol (VENTOLIN HFA) 108 (90 Base) MCG/ACT inhaler 8 puff (8 puffs Inhalation Given 05/15/19 2109)  ipratropium (ATROVENT HFA) inhaler 4 puff (4 puffs Inhalation Given 05/15/19 2109)  albuterol (VENTOLIN HFA) 108 (90 Base) MCG/ACT inhaler 8 puff (8 puffs Inhalation Given 05/15/19 2242)  ipratropium (ATROVENT HFA) inhaler 2 puff (2 puffs Inhalation Given 05/15/19 2242)  potassium chloride SA (KLOR-CON) CR tablet 40 mEq (40 mEq Oral Given 05/15/19 2350)        Consult Orders  (From admission, onward)         Start     Ordered   05/15/19 2241  Consult to hospitalist  ALL PATIENTS BEING ADMITTED/HAVING PROCEDURES NEED COVID-19 SCREENING  Once    Comments: ALL PATIENTS BEING ADMITTED/HAVING PROCEDURES NEED COVID-19 SCREENING  Provider:  (Not yet assigned)  Question Answer Comment  Place call to: [facility]   Reason for Consult Admit   Diagnosis/Clinical Info for Consult: 9852      05/15/19 2241  Significant initial  Findings: Abnormal Labs Reviewed  BASIC METABOLIC PANEL - Abnormal; Notable for the following components:      Result Value   Potassium 3.3 (*)    Glucose, Bld 119 (*)    All other components within normal limits     Otherwise labs showing:    Recent Labs  Lab 05/15/19 1948  NA 138  K 3.3*  CO2 27  GLUCOSE 119*  BUN 7  CREATININE 0.52  CALCIUM 9.2    Cr stable,    Lab Results  Component Value Date   CREATININE 0.52 05/15/2019   CREATININE 0.65 02/11/2019   CREATININE 0.66 02/06/2018    No results for input(s): AST, ALT, ALKPHOS, BILITOT, PROT, ALBUMIN in  the last 168 hours. Lab Results  Component Value Date   CALCIUM 9.2 05/15/2019   PHOS 3.5 04/10/2016      WBC      Component Value Date/Time   WBC 7.7 05/15/2019 1948   ANC    Component Value Date/Time   NEUTROABS 4.1 05/15/2019 1948      Plt: Lab Results  Component Value Date   PLT 221 05/15/2019     Lactic Acid, Venous    Component Value Date/Time   LATICACIDVEN 1.4 04/09/2016 1956       COVID-19 Labs  No results for input(s): DDIMER, FERRITIN, LDH, CRP in the last 72 hours.  No results found for: SARSCOV2NAA   VBG ordered   HG/HCT  stable,      Component Value Date/Time   HGB 12.2 05/15/2019 1948   HCT 36.4 05/15/2019 1948      ECG: Ordered Personally reviewed by me showing: HR : 106 Rhythm:   Sinus tachycardia    no evidence of ischemic changes QTC 471      UA not ordered   Ordered    CXR - NON acute chronic COPD     ED Triage Vitals [05/15/19 1919]  Enc Vitals Group     BP 120/74     Pulse Rate (!) 106     Resp 18     Temp 98 F (36.7 C)     Temp Source Oral     SpO2 90 %     Weight      Height      Head Circumference      Peak Flow      Pain Score      Pain Loc      Pain Edu?      Excl. in [place]?   WEXH(37)@       Latest  Blood pressure 124/81, pulse (!) 101, temperature 98 F (36.7 C), temperature source Oral, resp. rate 19, SpO2 97 %.    Hospitalist was called for admission for COPD exacerbation   Review of Systems:    Pertinent positives include:  shortness of breath at rest.  dyspnea on exertion  non-productive cough wheezing. Constitutional:  No weight loss, night sweats, Fevers, chills, fatigue, weight loss  HEENT:  No headaches, Difficulty swallowing,Tooth/dental problems,Sore throat,  No sneezing, itching, ear ache, nasal congestion, post nasal drip,  Cardio-vascular:  No chest pain, Orthopnea, PND, anasarca, dizziness, palpitations.no Bilateral lower extremity swelling  GI:  No heartburn, indigestion,  abdominal pain, nausea, vomiting, diarrhea, change in bowel habits, loss of appetite, melena, blood in stool, hematemesis Resp:    No excess mucus, no productive cough, No, No coughing up of blood.No change in color of mucus.  Skin:  no rash or  lesions. No jaundice GU:  no dysuria, change in color of urine, no urgency or frequency. No straining to urinate.  No flank pain.  Musculoskeletal:  No joint pain or no joint swelling. No decreased range of motion. No back pain.  Psych:  No change in mood or affect. No depression or anxiety. No memory loss.  Neuro: no localizing neurological complaints, no tingling, no weakness, no double vision, no gait abnormality, no slurred speech, no confusion  All systems reviewed and apart from [place] all are negative  Past Medical History:   Past Medical History:  Diagnosis Date  . Cervical cancer ([place])   . Chronic idiopathic constipation   . COPD (chronic obstructive pulmonary disease) ([place])   . Depression   . Endometriosis   . HLD (hyperlipidemia)       Past Surgical History:  Procedure Laterality Date  . ABLATION ON ENDOMETRIOSIS    . APPENDECTOMY    . BREAST EXCISIONAL BIOPSY Left age 59   benign biopsy - chronic fatty deposit  . COLONOSCOPY WITH PROPOFOL N/A 08/04/2017   TAx3, angiodysplastic lesion, diverticulosis, rpt 5 yrs ([facility])  . EXPLORATORY LAPAROTOMY  1992   endometriosis  . FOOT SURGERY Right    needle imbeded  . LASER ABLATION CONDYLOMA CERVICAL / VULVAR    . NASAL SEPTUM SURGERY Bilateral   . OVARIAN CYST SURGERY Bilateral 1980   remote  . SALPINGOOPHORECTOMY Right 1983   ectopic pregnancy  . TONSILLECTOMY      Social History:  Ambulatory  independently      reports that she has been smoking cigarettes. She has a 23.50 pack-year smoking history. She has never used smokeless tobacco. She reports current alcohol use. She reports that she does not use drugs.     Family History:   Family History  Problem  Relation Age of Onset  . Heart disease Mother   . Hypertension Mother   . Hypertension Father   . COPD Father   . Hearing loss Maternal Grandmother   . Hypertension Maternal Grandmother   . Hearing loss Maternal Grandfather   . Hypertension Maternal Grandfather   . Colon cancer Paternal Grandmother     Allergies: Allergies  Allergen Reactions  . Imitrex [Sumatriptan] Other (See Comments)    Worsens migraine      Prior to Admission medications   Medication Sig Start Date End Date Taking? Authorizing Provider  aspirin EC 81 MG tablet Take 81 mg by mouth daily.    [provider]  benzonatate (TESSALON) 100 MG capsule Take 1 capsule (100 mg total) by mouth 2 (two) times daily as needed for cough. 01/29/19   Ria Bush, MD  buPROPion [facility]) 100 MG 12 hr tablet TAKE 1 TABLET BY MOUTH EVERY DAY 04/22/19   Ria Bush, MD  fluticasone [facility]) 50 MCG/ACT nasal spray Place 2 sprays into both nostrils daily. 08/13/18   Ria Bush, MD  gabapentin (NEURONTIN) 100 MG capsule Take 1 capsule (100 mg total) by mouth 2 (two) times daily. 08/13/18   Ria Bush, MD  hydrOXYzine (ATARAX/VISTARIL) 25 MG tablet TAKE 1 TABLET (25 MG TOTAL) BY MOUTH 2 (TWO) TIMES DAILY AS NEEDED FOR ANXIETY. 04/25/19   Ria Bush, MD  ipratropium-albuterol (DUONEB) 0.5-2.5 (3) MG/3ML SOLN INHALE 3 MLS INTO THE LUNGS EVERY 6 (SIX) HOURS AS NEEDED. 03/18/19   Ria Bush, MD  LINZESS 290 MCG CAPS capsule Take 1 capsule (290 mcg total) by mouth daily as needed (constipation). 08/13/18   Ria Bush, MD  Multiple Vitamins-Minerals (MULTIVITAMIN ADULT) TABS Take 1 tablet by mouth daily.    [provider]  naproxen (NAPROSYN) 375 MG tablet Take 1 tablet (375 mg total) by mouth 2 (two) times daily as needed for moderate pain. 08/13/18   Ria Bush, MD  nicotine (NICODERM CQ) 14 mg/24hr patch Place 1 patch (14 mg total) onto the skin daily. 02/25/19    Ria Bush, MD  omeprazole (PRILOSEC) 40 MG capsule TAKE 1 CAPSULE BY MOUTH DAILY. FOR THREE WEEKS THEN AS NEEDED. TAKE 30 MIN PRIOR TO LARGE MEAL 01/15/19   Ria Bush, MD  OXYGEN Inhale 3 L/min into the lungs continuous.    [provider]  polyethylene glycol powder (GLYCOLAX/MIRALAX) powder Take 17 g by mouth daily as needed for moderate constipation. 09/21/18   Ria Bush, MD  PROAIR HFA 108 [phone] Base) MCG/ACT inhaler TAKE 2 PUFFS BY MOUTH EVERY 6 HOURS AS NEEDED FOR WHEEZE OR SHORTNESS OF BREATH 01/29/19   Ria Bush, MD  sertraline (ZOLOFT) 100 MG tablet TAKE 1.5 TABLETS BY MOUTH DAILY 03/19/19   Ria Bush, MD  SYMBICORT 160-4.5 MCG/ACT inhaler Inhale 2 puffs into the lungs 2 (two) times daily. 08/13/18   Ria Bush, MD  tiotropium (SPIRIVA) 18 MCG inhalation capsule Place 1 capsule (18 mcg total) into inhaler and inhale daily. 08/13/18   Ria Bush, MD  tiZANidine (ZANAFLEX) 2 MG tablet TAKE 1 TABLET (2 MG TOTAL) BY MOUTH 2 (TWO) TIMES DAILY AS NEEDED FOR MUSCLE SPASMS (NECK PAIN). 02/16/19   Ria Bush, MD  traZODone (DESYREL) 50 MG tablet Take 1 tablet (50 mg total) by mouth at bedtime. 08/13/18   Ria Bush, MD  vitamin B-12 (CYANOCOBALAMIN) 1000 MCG tablet Take 1,000 mcg by mouth daily.    [provider]  vitamin E 400 UNIT capsule Take 400 Units by mouth daily.     [provider]   Physical Exam: Blood pressure 124/81, pulse (!) 101, temperature 98 F (36.7 C), temperature source Oral, resp. rate 19, SpO2 97 %. 1. General:  in No Acute distress    Chronically ill, thin  -appearing 2. Psychological: Alert and  Oriented 3. Head/ENT:   Dry Mucous Membranes                          Head Non traumatic, neck supple                          Poor Dentition 4. SKIN:   decreased Skin turgor,  Skin clean Dry and intact no rash 5. Heart: Regular rate and rhythm no  Murmur, no Rub or gallop 6. Lungs:some   wheezes no crackles   7. Abdomen: Soft, non-tender, Non distended  bowel sounds present 8. Lower extremities: no clubbing, cyanosis, no  edema 9. Neurologically Grossly intact, moving all 4 extremities equally   10. MSK: Normal range of motion   All other LABS:     Recent Labs  Lab 05/15/19 1948  WBC 7.7  NEUTROABS 4.1  HGB 12.2  HCT 36.4  MCV 88.1  PLT 221     Recent Labs  Lab 05/15/19 1948  NA 138  K 3.3*  CL 99  CO2 27  GLUCOSE 119*  BUN 7  CREATININE 0.52  CALCIUM 9.2     No results for input(s): AST, ALT, ALKPHOS, BILITOT, PROT, ALBUMIN in the last 168 hours.     Cultures:    Component  Value Date/Time   SDES URINE, RANDOM 04/09/2016 2057   SPECREQUEST NONE 04/09/2016 2057   CULT (A) 04/09/2016 2057    <10,000 COLONIES/mL INSIGNIFICANT GROWTH Performed at [facility] 04/11/2016 FINAL 04/09/2016 2057     Radiological Exams on Admission: Dg Chest Port 1 View  Result Date: 05/15/2019 CLINICAL DATA:  Shortness of breath EXAM: PORTABLE CHEST 1 VIEW COMPARISON:  09/02/2018 FINDINGS: There is hyperinflation of the lungs compatible with COPD. Scarring in the upper lobes. Heart is normal size. No effusions or acute bony abnormality. IMPRESSION: COPD/chronic changes.  No active disease. Electronically Signed   By: Rolm Baptise M.D.   On: 05/15/2019 19:40    Chart has been reviewed    Assessment/Plan  58 y.o. female with medical history significant of COPD on 2 l of O2 at baseline vitamin D deficiency, hyperlipidemia, chronic constipation, depression, cervical cancer    Admitted for COPD exacerbation  Present on Admission:  . COPD with acute exacerbation ([place]) -    Will initiate: Steroid taper  -  Antibiotics azithromycin, - Albuterol and Atrovent inhaler scheduled changed to nebulizers when able,     -  Breo or Dulera at discharge   -  Mucinex.  Titrate O2 to saturation >90%. Follow patients respiratory status.  Order  respiratory panel PCR  VBG  ordered    Currently mentating well no evidence of symptomatic hypercarbia  Hypokalemia - - will replace and repeat in AM,  check magnesium level and replace as needed   . Chronic constipation stable continue home medications  . Tobacco abuse  - Spoke about importance of quitting spent 5 minutes discussing options for treatment, prior attempts at quitting, and dangers of smoking  -At this point patient is    interested in quitting  - order nicotine patch   - nursing tobacco cessation protocol  . HLD (hyperlipidemia) chronic stable not on statins   Other plan as per orders.  DVT prophylaxis:    Lovenox     Code Status:    DNR/DNI  as per patient  I had personally discussed CODE STATUS with patient  Family Communication:   Family not at  Bedside    Disposition Plan:      To home once workup is complete and patient is stable                     Would benefit from PT/OT eval prior to DC  Ordered                   Consults called: none Admission status:  ED Disposition    None      Obs      Level of care    tele  For   24H       Precautions: admitted as  PUI  Airborne and Contact precautions  If Covid PCR is negative  - please DC precautions    PPE: Used by the provider:   P100  eye Goggles,  Gloves   Glendine Swetz 05/16/2019, 12:36 AM     [facility]     after 2 AM please page floor coverage PA If 7AM-7PM, please contact the day team taking care of the patient using [email or web address]

## 2019-05-16 ENCOUNTER — Encounter (HOSPITAL_COMMUNITY): Payer: Self-pay

## 2019-05-16 DIAGNOSIS — E785 Hyperlipidemia, unspecified: Secondary | ICD-10-CM | POA: Diagnosis not present

## 2019-05-16 DIAGNOSIS — Z8541 Personal history of malignant neoplasm of cervix uteri: Secondary | ICD-10-CM | POA: Diagnosis not present

## 2019-05-16 DIAGNOSIS — J9621 Acute and chronic respiratory failure with hypoxia: Secondary | ICD-10-CM | POA: Diagnosis not present

## 2019-05-16 DIAGNOSIS — E559 Vitamin D deficiency, unspecified: Secondary | ICD-10-CM | POA: Diagnosis present

## 2019-05-16 DIAGNOSIS — Z90721 Acquired absence of ovaries, unilateral: Secondary | ICD-10-CM | POA: Diagnosis not present

## 2019-05-16 DIAGNOSIS — K5909 Other constipation: Secondary | ICD-10-CM

## 2019-05-16 DIAGNOSIS — M810 Age-related osteoporosis without current pathological fracture: Secondary | ICD-10-CM | POA: Diagnosis present

## 2019-05-16 DIAGNOSIS — Z7982 Long term (current) use of aspirin: Secondary | ICD-10-CM | POA: Diagnosis not present

## 2019-05-16 DIAGNOSIS — Z66 Do not resuscitate: Secondary | ICD-10-CM | POA: Diagnosis not present

## 2019-05-16 DIAGNOSIS — K581 Irritable bowel syndrome with constipation: Secondary | ICD-10-CM | POA: Diagnosis present

## 2019-05-16 DIAGNOSIS — I700 Atherosclerosis of aorta: Secondary | ICD-10-CM | POA: Diagnosis present

## 2019-05-16 DIAGNOSIS — Z9079 Acquired absence of other genital organ(s): Secondary | ICD-10-CM | POA: Diagnosis not present

## 2019-05-16 DIAGNOSIS — Z20828 Contact with and (suspected) exposure to other viral communicable diseases: Secondary | ICD-10-CM | POA: Diagnosis present

## 2019-05-16 DIAGNOSIS — E876 Hypokalemia: Secondary | ICD-10-CM | POA: Diagnosis present

## 2019-05-16 DIAGNOSIS — F17210 Nicotine dependence, cigarettes, uncomplicated: Secondary | ICD-10-CM | POA: Diagnosis present

## 2019-05-16 DIAGNOSIS — Z79899 Other long term (current) drug therapy: Secondary | ICD-10-CM | POA: Diagnosis not present

## 2019-05-16 DIAGNOSIS — Z720 Tobacco use: Secondary | ICD-10-CM

## 2019-05-16 DIAGNOSIS — G4700 Insomnia, unspecified: Secondary | ICD-10-CM | POA: Diagnosis present

## 2019-05-16 DIAGNOSIS — R911 Solitary pulmonary nodule: Secondary | ICD-10-CM | POA: Diagnosis present

## 2019-05-16 DIAGNOSIS — F339 Major depressive disorder, recurrent, unspecified: Secondary | ICD-10-CM | POA: Diagnosis present

## 2019-05-16 DIAGNOSIS — K219 Gastro-esophageal reflux disease without esophagitis: Secondary | ICD-10-CM | POA: Diagnosis present

## 2019-05-16 DIAGNOSIS — J441 Chronic obstructive pulmonary disease with (acute) exacerbation: Secondary | ICD-10-CM | POA: Diagnosis not present

## 2019-05-16 DIAGNOSIS — Z9981 Dependence on supplemental oxygen: Secondary | ICD-10-CM | POA: Diagnosis not present

## 2019-05-16 DIAGNOSIS — F1011 Alcohol abuse, in remission: Secondary | ICD-10-CM | POA: Diagnosis present

## 2019-05-16 DIAGNOSIS — B1920 Unspecified viral hepatitis C without hepatic coma: Secondary | ICD-10-CM | POA: Diagnosis present

## 2019-05-16 DIAGNOSIS — Z85038 Personal history of other malignant neoplasm of large intestine: Secondary | ICD-10-CM | POA: Diagnosis not present

## 2019-05-16 LAB — BLOOD GAS, ARTERIAL
Acid-Base Excess: 3.8 mmol/L — ABNORMAL HIGH (ref 0.0–2.0)
Bicarbonate: 29.5 mmol/L — ABNORMAL HIGH (ref 20.0–28.0)
O2 Saturation: 99.4 %
Patient temperature: 98.6
pCO2 arterial: 52.1 mmHg — ABNORMAL HIGH (ref 32.0–48.0)
pH, Arterial: 7.371 (ref 7.350–7.450)
pO2, Arterial: 192 mmHg — ABNORMAL HIGH (ref 83.0–108.0)

## 2019-05-16 LAB — CBC
HCT: 37.7 % (ref 36.0–46.0)
Hemoglobin: 12 g/dL (ref 12.0–15.0)
MCH: 28.4 pg (ref 26.0–34.0)
MCHC: 31.8 g/dL (ref 30.0–36.0)
MCV: 89.3 fL (ref 80.0–100.0)
Platelets: 252 10*3/uL (ref 150–400)
RBC: 4.22 MIL/uL (ref 3.87–5.11)
RDW: 14.6 % (ref 11.5–15.5)
WBC: 3.1 10*3/uL — ABNORMAL LOW (ref 4.0–10.5)
nRBC: 0 % (ref 0.0–0.2)

## 2019-05-16 LAB — COMPREHENSIVE METABOLIC PANEL
ALT: 12 U/L (ref 0–44)
AST: 22 U/L (ref 15–41)
Albumin: 4.2 g/dL (ref 3.5–5.0)
Alkaline Phosphatase: 74 U/L (ref 38–126)
Anion gap: 6 (ref 5–15)
BUN: 9 mg/dL (ref 6–20)
CO2: 29 mmol/L (ref 22–32)
Calcium: 8.8 mg/dL — ABNORMAL LOW (ref 8.9–10.3)
Chloride: 103 mmol/L (ref 98–111)
Creatinine, Ser: 0.52 mg/dL (ref 0.44–1.00)
GFR calc Af Amer: >60 mL/min (ref >60)
GFR calc non Af Amer: >60 mL/min (ref >60)
Glucose, Bld: 139 mg/dL — ABNORMAL HIGH (ref 70–99)
Potassium: 5.4 mmol/L — ABNORMAL HIGH (ref 3.5–5.1)
Sodium: 138 mmol/L (ref 135–145)
Total Bilirubin: 0.4 mg/dL (ref 0.3–1.2)
Total Protein: 7 g/dL (ref 6.5–8.1)

## 2019-05-16 LAB — HIV ANTIBODY (ROUTINE TESTING W REFLEX): HIV Screen 4th Generation wRfx: NONREACTIVE

## 2019-05-16 LAB — BLOOD GAS, VENOUS
Acid-Base Excess: 3.7 mmol/L — ABNORMAL HIGH (ref 0.0–2.0)
Bicarbonate: 31.4 mmol/L — ABNORMAL HIGH (ref 20.0–28.0)
O2 Saturation: 47.4 %
Patient temperature: 98.6

## 2019-05-16 LAB — SARS CORONAVIRUS 2 (TAT 6-24 HRS): SARS Coronavirus 2: NEGATIVE

## 2019-05-16 LAB — MAGNESIUM
Magnesium: 2 mg/dL (ref 1.7–2.4)
Magnesium: 2.3 mg/dL (ref 1.7–2.4)

## 2019-05-16 LAB — PHOSPHORUS: Phosphorus: 2.9 mg/dL (ref 2.5–4.6)

## 2019-05-16 LAB — TSH: TSH: 0.469 u[IU]/mL (ref 0.350–4.500)

## 2019-05-16 MED ORDER — SODIUM CHLORIDE 0.9% FLUSH
3.0000 mL | Freq: Two times a day (BID) | INTRAVENOUS | Status: DC
  Administered 2019-05-16 – 2019-05-20 (×10): 3 mL via INTRAVENOUS

## 2019-05-16 MED ORDER — FLUTICASONE PROPIONATE 50 MCG/ACT NA SUSP
2.0000 | Freq: Every day | NASAL | Status: DC
  Administered 2019-05-16 – 2019-05-20 (×5): 2 via NASAL
  Filled 2019-05-16: qty 16

## 2019-05-16 MED ORDER — PANTOPRAZOLE SODIUM 40 MG PO TBEC
40.0000 mg | DELAYED_RELEASE_TABLET | Freq: Every day | ORAL | Status: DC
  Administered 2019-05-16 – 2019-05-20 (×5): 40 mg via ORAL
  Filled 2019-05-16 (×5): qty 1

## 2019-05-16 MED ORDER — ONDANSETRON HCL 4 MG PO TABS: 4.0000 mg | ORAL_TABLET | Freq: Four times a day (QID) | ORAL | Status: DC | PRN

## 2019-05-16 MED ORDER — IPRATROPIUM BROMIDE HFA 17 MCG/ACT IN AERS
2.0000 | INHALATION_SPRAY | Freq: Three times a day (TID) | RESPIRATORY_TRACT | Status: DC
  Administered 2019-05-16 – 2019-05-17 (×4): 2 via RESPIRATORY_TRACT

## 2019-05-16 MED ORDER — LINACLOTIDE 145 MCG PO CAPS
290.0000 ug | ORAL_CAPSULE | Freq: Every day | ORAL | Status: DC | PRN
  Administered 2019-05-18 – 2019-05-19 (×2): 290 ug via ORAL
  Filled 2019-05-16 (×4): qty 2

## 2019-05-16 MED ORDER — ACETAMINOPHEN 325 MG PO TABS: 650.0000 mg | ORAL_TABLET | Freq: Four times a day (QID) | ORAL | Status: DC | PRN

## 2019-05-16 MED ORDER — ONDANSETRON HCL 4 MG/2ML IJ SOLN: 4.0000 mg | Freq: Four times a day (QID) | INTRAMUSCULAR | Status: DC | PRN

## 2019-05-16 MED ORDER — MOMETASONE FURO-FORMOTEROL FUM 200-5 MCG/ACT IN AERO: 1.0000 | INHALATION_SPRAY | Freq: Two times a day (BID) | RESPIRATORY_TRACT | Status: DC

## 2019-05-16 MED ORDER — ASPIRIN EC 81 MG PO TBEC
81.0000 mg | DELAYED_RELEASE_TABLET | Freq: Every day | ORAL | Status: DC
  Administered 2019-05-16 – 2019-05-20 (×5): 81 mg via ORAL
  Filled 2019-05-16 (×5): qty 1

## 2019-05-16 MED ORDER — SODIUM CHLORIDE 0.9 % IV SOLN
500.0000 mg | INTRAVENOUS | Status: AC
  Administered 2019-05-16: 05:00:00 500 mg via INTRAVENOUS
  Filled 2019-05-16: qty 500

## 2019-05-16 MED ORDER — ACETAMINOPHEN 650 MG RE SUPP: 650.0000 mg | Freq: Four times a day (QID) | RECTAL | Status: DC | PRN

## 2019-05-16 MED ORDER — GABAPENTIN 100 MG PO CAPS
100.0000 mg | ORAL_CAPSULE | Freq: Two times a day (BID) | ORAL | Status: DC
  Administered 2019-05-16 – 2019-05-20 (×10): 100 mg via ORAL
  Filled 2019-05-16 (×10): qty 1

## 2019-05-16 MED ORDER — NICOTINE 21 MG/24HR TD PT24
21.0000 mg | MEDICATED_PATCH | Freq: Every day | TRANSDERMAL | Status: DC
  Administered 2019-05-16 – 2019-05-20 (×5): 21 mg via TRANSDERMAL
  Filled 2019-05-16 (×5): qty 1

## 2019-05-16 MED ORDER — HYDROCODONE-ACETAMINOPHEN 5-325 MG PO TABS
1.0000 | ORAL_TABLET | ORAL | Status: DC | PRN
  Administered 2019-05-18: 2 via ORAL
  Filled 2019-05-16: qty 2

## 2019-05-16 MED ORDER — ALBUTEROL SULFATE HFA 108 (90 BASE) MCG/ACT IN AERS
2.0000 | INHALATION_SPRAY | Freq: Three times a day (TID) | RESPIRATORY_TRACT | Status: DC
  Administered 2019-05-16 – 2019-05-17 (×3): 2 via RESPIRATORY_TRACT

## 2019-05-16 MED ORDER — METHYLPREDNISOLONE SODIUM SUCC 40 MG IJ SOLR
40.0000 mg | Freq: Two times a day (BID) | INTRAMUSCULAR | Status: AC
  Administered 2019-05-16 (×2): 40 mg via INTRAVENOUS
  Filled 2019-05-16 (×2): qty 1

## 2019-05-16 MED ORDER — HYDROXYZINE HCL 25 MG PO TABS
25.0000 mg | ORAL_TABLET | Freq: Two times a day (BID) | ORAL | Status: DC | PRN
  Administered 2019-05-18: 25 mg via ORAL
  Filled 2019-05-16: qty 1

## 2019-05-16 MED ORDER — MOMETASONE FURO-FORMOTEROL FUM 200-5 MCG/ACT IN AERO
2.0000 | INHALATION_SPRAY | Freq: Two times a day (BID) | RESPIRATORY_TRACT | Status: DC
  Administered 2019-05-16 – 2019-05-17 (×3): 2 via RESPIRATORY_TRACT
  Filled 2019-05-16: qty 8.8

## 2019-05-16 MED ORDER — ENOXAPARIN SODIUM 40 MG/0.4ML ~~LOC~~ SOLN
40.0000 mg | Freq: Every day | SUBCUTANEOUS | Status: DC
  Administered 2019-05-16 – 2019-05-20 (×5): 40 mg via SUBCUTANEOUS
  Filled 2019-05-16 (×5): qty 0.4

## 2019-05-16 MED ORDER — PREDNISONE 20 MG PO TABS
40.0000 mg | ORAL_TABLET | Freq: Every day | ORAL | Status: DC
  Administered 2019-05-17 – 2019-05-19 (×3): 40 mg via ORAL
  Filled 2019-05-16 (×3): qty 2

## 2019-05-16 MED ORDER — TRAZODONE HCL 50 MG PO TABS
50.0000 mg | ORAL_TABLET | Freq: Every day | ORAL | Status: DC
  Administered 2019-05-16 – 2019-05-19 (×4): 50 mg via ORAL
  Filled 2019-05-16 (×4): qty 1

## 2019-05-16 MED ORDER — BUPROPION HCL ER (SR) 100 MG PO TB12
100.0000 mg | ORAL_TABLET | Freq: Every day | ORAL | Status: DC
  Administered 2019-05-16 – 2019-05-20 (×5): 100 mg via ORAL
  Filled 2019-05-16 (×5): qty 1

## 2019-05-16 MED ORDER — SODIUM CHLORIDE 0.9 % IV SOLN
INTRAVENOUS | Status: DC
  Administered 2019-05-16: 04:00:00 via INTRAVENOUS

## 2019-05-16 MED ORDER — AZITHROMYCIN 250 MG PO TABS
500.0000 mg | ORAL_TABLET | Freq: Every day | ORAL | Status: AC
  Administered 2019-05-17 – 2019-05-20 (×4): 500 mg via ORAL
  Filled 2019-05-16 (×6): qty 2

## 2019-05-16 MED ORDER — SERTRALINE HCL 100 MG PO TABS
100.0000 mg | ORAL_TABLET | Freq: Every day | ORAL | Status: DC
  Administered 2019-05-16 – 2019-05-19 (×4): 100 mg via ORAL
  Filled 2019-05-16 (×4): qty 1

## 2019-05-16 NOTE — Plan of Care (Signed)
  Problem: Education: Goal: Knowledge of General Education information will improve Description: Including pain rating scale, medication(s)/side effects and non-pharmacologic comfort measures Outcome: Progressing   Problem: Clinical Measurements: Goal: Ability to maintain clinical measurements within normal limits will improve Outcome: Progressing Goal: Will remain free from infection Outcome: Progressing Goal: Diagnostic test results will improve Outcome: Progressing Goal: Respiratory complications will improve Outcome: Progressing   Problem: Elimination: Goal: Will not experience complications related to bowel motility Outcome: Progressing

## 2019-05-16 NOTE — Evaluation (Signed)
Physical Therapy Evaluation Patient Details Name: Christine Serrano MRN: [id number] DOB: [birth year] Today's Date: 05/16/2019   History of Present Illness  58 y o female as admitted for cough/sob and constipation. PMH:  COPD, on 2 liters at baseline, depression and cervical CA  Clinical Impression  Pt admitted as above and presenting with functional mobility limitations 2* poor endurance and mild ambulatory balance deficits.  Pt should progress to dc home with family assist.    Follow Up Recommendations No PT follow up    Equipment Recommendations  None recommended by PT    Recommendations for Other Services       Precautions / Restrictions Precautions Precautions: Fall Restrictions Weight Bearing Restrictions: No      Mobility  Bed Mobility Overal bed mobility: Independent                Transfers Overall transfer level: Needs assistance   Transfers: Sit to/from Stand Sit to Stand: Min guard         General transfer comment: min guard for safety  Ambulation/Gait Ambulation/Gait assistance: Min assist;Min guard Gait Distance (Feet): 42 Feet Assistive device: 1 person hand held assist Gait Pattern/deviations: Step-through pattern;Decreased step length - right;Decreased step length - left;Shuffle;Trunk flexed;Narrow base of support Gait velocity: decr   General Gait Details: mild general instability increased with side-dtepping and back-stepping; no Over LOB  Stairs            Wheelchair Mobility    Modified Rankin (Stroke Patients Only)       Balance Overall balance assessment: Needs assistance Sitting-balance support: No upper extremity supported;Feet supported Sitting balance-Leahy Scale: Good     Standing balance support: No upper extremity supported Standing balance-Leahy Scale: Fair                               Pertinent Vitals/Pain Pain Assessment: No/denies pain    Home Living Family/patient expects to be  discharged to:: Private residence Living Arrangements: Other relatives Available Help at Discharge: Friend(s) Type of Home: House Home Access: Stairs to enter Entrance Stairs-Rails: None Entrance Stairs-Number of Steps: 2 Home Layout: One level Home Equipment: None Additional Comments: commode has grab bar. 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     Hand Dominance        Extremity/Trunk Assessment   Upper Extremity Assessment Upper Extremity Assessment: Overall WFL for tasks assessed    Lower Extremity Assessment Lower Extremity Assessment: Overall WFL for tasks assessed    Cervical / Trunk Assessment Cervical / Trunk Assessment: Kyphotic  Communication   Communication: No difficulties  Cognition Arousal/Alertness: Awake/alert Behavior During Therapy: WFL for tasks assessed/performed Overall Cognitive Status: Within Functional Limits for tasks assessed                                        General Comments General comments (skin integrity, edema, etc.): HR 112-127    Exercises     Assessment/Plan    PT Assessment Patient needs continued PT services  PT Problem List Decreased activity tolerance;Decreased balance;Decreased mobility;Decreased knowledge of use of DME       PT Treatment Interventions DME instruction;Gait training;Stair training;Functional mobility training;Therapeutic activities;Therapeutic exercise;Patient/family education;Balance  training    PT Goals (Current goals can be found in the Care Plan section)  Acute Rehab PT Goals Patient Stated Goal: none stated; agreeable to working with therapy PT Goal Formulation: With patient Time For Goal Achievement: 05/29/19 Potential to Achieve Goals: Good    Frequency Min 3X/week   Barriers to discharge        Co-evaluation PT/OT/SLP Co-Evaluation/Treatment: Yes Reason  for Co-Treatment: For patient/therapist safety PT goals addressed during session: Mobility/safety with mobility OT goals addressed during session: ADL's and self-care       AM-PAC PT "6 Clicks" Mobility  Outcome Measure Help needed turning from your back to your side while in a flat bed without using bedrails?: None Help needed moving from lying on your back to sitting on the side of a flat bed without using bedrails?: None Help needed moving to and from a bed to a chair (including a wheelchair)?: A Little Help needed standing up from a chair using your arms (e.g., wheelchair or bedside chair)?: A Little Help needed to walk in hospital room?: A Little Help needed climbing 3-5 steps with a railing? : A Little 6 Click Score: 20    End of Session Equipment Utilized During Treatment: Gait belt;Oxygen Activity Tolerance: Patient tolerated treatment well;Patient limited by fatigue Patient left: in bed;with call bell/phone within reach;with bed alarm set Nurse Communication: Mobility status PT Visit Diagnosis: Unsteadiness on feet (R26.81);Difficulty in walking, not elsewhere classified (R26.2)    Time: [id number] PT Time Calculation (min) (ACUTE ONLY): 26 min   Charges:   PT Evaluation $PT Eval Low Complexity: 1 Low          [place] Pager [phone] Office [phone]   Dmario Russom 05/16/2019, 1:58 PM

## 2019-05-16 NOTE — Progress Notes (Addendum)
PROGRESS NOTE    ADIELLA Serrano  [id number] DOB: 1960-07-13 DOA: 05/15/2019 PCP: Ria Bush, MD    Brief Narrative:   Christine Serrano is a 58 year old Caucasian female with past medical history remarkable for COPD on 2 L nasal cannula baseline, chronic constipation, depression, history of cervical cancer, HLD and continued tobacco use disorder who presents with off and progressive shortness of breath.  Upon EMS arrival, they noted she was having significant dyspnea with expiratory wheezes and was administered albuterol and Solu-Medrol 125 mg IV.  Patient currently lives with roommate, who was recently tested for Covid-19 last week which was negative.  She has been using her home inhalers and nebs more frequently.  Has not seen her outpatient pulmonologist in more than 1 year.  And she endorses continued tobacco abuse with 1 pack/day.    Assessment & Plan:   Active Problems:   Chronic constipation   Tobacco abuse   HLD (hyperlipidemia)   COPD exacerbation (HCC)   COPD with acute exacerbation (HCC)   Acute on chronic hypoxic respiratory failure Acute COPD exacerbation Patient presenting with progressive cough and shortness of breath complicated by her continued tobacco abuse disorder.  Rapid Covid-19 test negative, patient is afebrile without leukocytosis.  ABG pH 7.4/PaCO2 52.1/PaO2 192.  Chest x-ray with COPD and chronic changes no active cardiopulmonary disease process. --Covid-19/SARS-CoV-2 PCR: Pending; continue airborne precautions until results --Respiratory viral panel: Pending --Azithromycin 500 mg daily, plan 5-day course --Continue prednisone 40 mg p.o. daily x5 days --Mometasone-formoterol 200-5 mcg 2 puffs twice daily --Ipratropium 2 puffs inhaled 3 times daily --Continue supplemental oxygen, maintain SPO2 greater than 88%  Depression --Sertraline 100 mg p.o. nightly for prion 100 mg p.o. daily  Insomnia --Trazodone 50 mg nightly  Tobacco use  disorder Patient endorses continued 1 pack/day.  Discussed with patient need for complete cessation given her severe lung disease from underlying COPD. --Nicotine patch  GERD: Continue Protonix 40 mg p.o. daily  IBS with constipation predominant: Continue Linzess   DVT prophylaxis: Lovenox Code Status: DNR Family Communication: None Disposition Plan: Continue inpatient, anticipate discharge home when medically ready, likely 1-2 days.  Currently lives with roommate.   Consultants:   None  Procedures:   None  Antimicrobials:   Azithromycin 11/28>>   Subjective: Patient seen and examined at bedside, resting comfortably.  States breathing is improved, but continues with significant wheezing.  Oxygen now titrated down to 2 L per nasal cannula which is her home dose.  Continues with cough, white/yellow sputum.  No other complaints or concerns at this time.  Understands needs to discontinue all tobacco use.  Denies headache, no dizziness, no chest pain, no nausea/vomiting/diarrhea, no abdominal pain, no weakness, fatigue, no paresthesias.  No acute events overnight per nursing staff.  Objective: Vitals:   05/16/19 0258 05/16/19 0300 05/16/19 0543 05/16/19 1238  BP: 126/78  117/80 103/69  Pulse: 93  (!) 101 (!) 116  Resp: (!) 22  20 20   Temp: 98.1 F (36.7 C)  97.9 F (36.6 C) 99.1 F (37.3 C)  TempSrc: Oral  Oral Oral  SpO2: 99%  100% 99%  Weight:  50.5 kg    Height:  5\' 4"  (1.626 m)      Intake/Output Summary (Last 24 hours) at 05/16/2019 1413 Last data filed at 05/16/2019 0600 Gross per 24 hour  Intake 507.65 ml  Output --  Net 507.65 ml   Filed Weights   05/16/19 0300  Weight: 50.5 kg    Examination:  General exam: Appears calm and comfortable, frail/thin in appearance Respiratory system: Coarse breath sounds bilaterally, slightly decreased bases, global expiratory wheezing bilaterally, normal respiratory effort without accessory muscle use, on 2 L nasal  cannula Cardiovascular system: S1 & S2 heard, RRR. No JVD, murmurs, rubs, gallops or clicks. No pedal edema. Gastrointestinal system: Abdomen is nondistended, soft and nontender. No organomegaly or masses felt. Normal bowel sounds heard. Central nervous system: Alert and oriented. No focal neurological deficits. Extremities: Symmetric 5 x 5 power. Skin: No rashes, lesions or ulcers Psychiatry: Judgement and insight appear normal. Mood & affect appropriate.     Data Reviewed: I have personally reviewed following labs and imaging studies  CBC: Recent Labs  Lab 05/15/19 1948 05/16/19 0452  WBC 7.7 3.1*  NEUTROABS 4.1  --   HGB 12.2 12.0  HCT 36.4 37.7  MCV 88.1 89.3  PLT 221 [id number]   Basic Metabolic Panel: Recent Labs  Lab 05/15/19 1948 05/15/19 2330 05/16/19 0452  NA 138  --  138  K 3.3*  --  5.4*  CL 99  --  103  CO2 27  --  29  GLUCOSE 119*  --  139*  BUN 7  --  9  CREATININE 0.52  --  0.52  CALCIUM 9.2  --  8.8*  MG  --  2.0 2.3  PHOS  --   --  2.9   GFR: Estimated Creatinine Clearance: 59.6 mL/min (by C-G formula based on SCr of 0.52 mg/dL). Liver Function Tests: Recent Labs  Lab 05/16/19 0452  AST 22  ALT 12  ALKPHOS 74  BILITOT 0.4  PROT 7.0  ALBUMIN 4.2   No results for input(s): LIPASE, AMYLASE in the last 168 hours. No results for input(s): AMMONIA in the last 168 hours. Coagulation Profile: No results for input(s): INR, PROTIME in the last 168 hours. Cardiac Enzymes: No results for input(s): CKTOTAL, CKMB, CKMBINDEX, TROPONINI in the last 168 hours. BNP (last 3 results) No results for input(s): PROBNP in the last 8760 hours. HbA1C: No results for input(s): HGBA1C in the last 72 hours. CBG: No results for input(s): GLUCAP in the last 168 hours. Lipid Profile: No results for input(s): CHOL, HDL, LDLCALC, TRIG, CHOLHDL, LDLDIRECT in the last 72 hours. Thyroid Function Tests: Recent Labs    05/16/19 0452  TSH 0.469   Anemia Panel: No results  for input(s): VITAMINB12, FOLATE, FERRITIN, TIBC, IRON, RETICCTPCT in the last 72 hours. Sepsis Labs: No results for input(s): PROCALCITON, LATICACIDVEN in the last 168 hours.  No results found for this or any previous visit (from the past 240 hour(s)).       Radiology Studies: Dg Chest Port 1 View  Result Date: 05/15/2019 CLINICAL DATA:  Shortness of breath EXAM: PORTABLE CHEST 1 VIEW COMPARISON:  09/02/2018 FINDINGS: There is hyperinflation of the lungs compatible with COPD. Scarring in the upper lobes. Heart is normal size. No effusions or acute bony abnormality. IMPRESSION: COPD/chronic changes.  No active disease. Electronically Signed   By: Rolm Baptise M.D.   On: 05/15/2019 19:40        Scheduled Meds:  albuterol  2 puff Inhalation TID   aspirin EC  81 mg Oral Daily   [START ON 05/17/2019] azithromycin  500 mg Oral Daily   buPROPion  100 mg Oral Daily   enoxaparin (LOVENOX) injection  40 mg Subcutaneous Daily   fluticasone  2 spray Each Nare Daily   gabapentin  100 mg Oral BID   ipratropium  2 puff Inhalation TID   methylPREDNISolone (SOLU-MEDROL) injection  40 mg Intravenous Q12H   Followed by   Derrill Memo ON 05/17/2019] predniSONE  40 mg Oral Q breakfast   mometasone-formoterol  2 puff Inhalation BID   nicotine  21 mg Transdermal Daily   pantoprazole  40 mg Oral Daily   sertraline  100 mg Oral QHS   sodium chloride flush  3 mL Intravenous Q12H   traZODone  50 mg Oral QHS   Continuous Infusions:   LOS: 0 days    Time spent: 32 minutes spent on chart review, discussion with nursing staff, consultants, updating family and interview/physical exam; more than 50% of that time was spent in counseling and/or coordination of care.    Christine Kosta J [place] DO [facility] 05/16/2019, 2:13 PM

## 2019-05-16 NOTE — Progress Notes (Signed)
MEWS score changed to yellow due to pulse of 110-120.  All other VS WNL.  MD made aware - VS algorithm initiated.

## 2019-05-16 NOTE — Evaluation (Signed)
Occupational Therapy Evaluation Patient Details Name: Christine Serrano MRN: [id number] DOB: 1960/08/18 Today's Date: 05/16/2019    History of Present Illness 58 y o female as admitted for cough/sob and constipation. PMH:  COPD, on 2 liters at baseline, depression and cervical CA   Clinical Impression   Pt was admitted for the above.  She feels that she is near baseline for mobility and adls.  When not holding to furniture, she is min A for steadying.  Reviewed energy conservation. Pt uses 2 liters of 02 all the time at baseline.    Follow Up Recommendations  No OT follow up    Equipment Recommendations  None recommended by OT    Recommendations for Other Services       Precautions / Restrictions Precautions Precautions: Fall Restrictions Weight Bearing Restrictions: No      Mobility Bed Mobility Overal bed mobility: Independent                Transfers                 General transfer comment: min guard for safety    Balance                                           ADL either performed or assessed with clinical judgement   ADL Overall ADL's : At baseline                                       General ADL Comments: Pt states that she tends to furniture walk at home. Steadying assistance in room, when she wasn't holding to something. Crosses legs for ADLs.  Encouraged her to pace herself and take rest breaks when needed. She states that she is working on this.  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  Sats in 90s on 02    Exercises     Shoulder Instructions      Home Living Family/patient expects to be discharged to:: Private residence   Available Help at Discharge: Friend(s) Type of Home: House Home Access: Stairs to enter [employer] of Steps: 2 Entrance Stairs-Rails: None Home Layout: One level     Bathroom Shower/Tub: Occupational psychologist: Standard     Home Equipment: None   Additional Comments: commode has grab bar. Pt uses 2 liters all the time      Prior Functioning/Environment Level of Independence: Independent  OT Problem List:        OT Treatment/Interventions:      OT Goals(Current goals can be found in the care plan section) Acute Rehab OT Goals Patient Stated Goal: none stated; agreeable to working with therapy OT Goal Formulation: All assessment and education complete, DC therapy  OT Frequency:     Barriers to D/C:            Co-evaluation PT/OT/SLP Co-Evaluation/Treatment: Yes Reason for Co-Treatment: For patient/therapist safety PT goals addressed during session: Mobility/safety with mobility OT goals addressed during session: ADL's and self-care      AM-PAC OT "6 Clicks" Daily Activity     Outcome Measure Help from another person eating meals?: None Help from another person taking care of personal grooming?: A Little Help from another person toileting, which includes using toliet, bedpan, or urinal?: A Little Help from another person bathing (including washing, rinsing, drying)?: A Little Help from another person to put on and taking off regular upper body clothing?: A Little Help from another person to put on and taking off regular lower body clothing?: A Little 6 Click Score: 19   End of Session    Activity  Tolerance: Patient tolerated treatment well Patient left: in bed;with call bell/phone within reach  OT Visit Diagnosis: Muscle weakness (generalized) (M62.81)                Time: [id number] OT Time Calculation (min): 24 min Charges:  OT General Charges $OT Visit: 1 Visit OT Evaluation $OT Eval Low Complexity: 1 Low  Lesle Chris, OTR/L Acute Rehabilitation Services [phone] [facility] pager [phone] office 05/16/2019  [place] 05/16/2019, 12:21 PM

## 2019-05-17 ENCOUNTER — Ambulatory Visit: Payer: Medicare Other

## 2019-05-17 LAB — BASIC METABOLIC PANEL
Anion gap: 5 (ref 5–15)
BUN: 18 mg/dL (ref 6–20)
CO2: 29 mmol/L (ref 22–32)
Calcium: 8.7 mg/dL — ABNORMAL LOW (ref 8.9–10.3)
Chloride: 107 mmol/L (ref 98–111)
Creatinine, Ser: 0.71 mg/dL (ref 0.44–1.00)
GFR calc Af Amer: >60 mL/min (ref >60)
GFR calc non Af Amer: >60 mL/min (ref >60)
Glucose, Bld: 109 mg/dL — ABNORMAL HIGH (ref 70–99)
Potassium: 3.9 mmol/L (ref 3.5–5.1)
Sodium: 141 mmol/L (ref 135–145)

## 2019-05-17 LAB — CBC
HCT: 34.7 % — ABNORMAL LOW (ref 36.0–46.0)
Hemoglobin: 11 g/dL — ABNORMAL LOW (ref 12.0–15.0)
MCH: 28.6 pg (ref 26.0–34.0)
MCHC: 31.7 g/dL (ref 30.0–36.0)
MCV: 90.4 fL (ref 80.0–100.0)
Platelets: 193 10*3/uL (ref 150–400)
RBC: 3.84 MIL/uL — ABNORMAL LOW (ref 3.87–5.11)
RDW: 14.7 % (ref 11.5–15.5)
WBC: 5.5 10*3/uL (ref 4.0–10.5)
nRBC: 0 % (ref 0.0–0.2)

## 2019-05-17 MED ORDER — IPRATROPIUM-ALBUTEROL 0.5-2.5 (3) MG/3ML IN SOLN
3.0000 mL | Freq: Four times a day (QID) | RESPIRATORY_TRACT | Status: DC
  Administered 2019-05-17: 13:00:00 3 mL via RESPIRATORY_TRACT
  Filled 2019-05-17: qty 3

## 2019-05-17 MED ORDER — IPRATROPIUM-ALBUTEROL 0.5-2.5 (3) MG/3ML IN SOLN
3.0000 mL | Freq: Three times a day (TID) | RESPIRATORY_TRACT | Status: DC
  Administered 2019-05-17 – 2019-05-20 (×9): 3 mL via RESPIRATORY_TRACT
  Filled 2019-05-17 (×9): qty 3

## 2019-05-17 MED ORDER — BUDESONIDE 0.25 MG/2ML IN SUSP
0.2500 mg | Freq: Two times a day (BID) | RESPIRATORY_TRACT | Status: DC
  Administered 2019-05-17 – 2019-05-20 (×6): 0.25 mg via RESPIRATORY_TRACT
  Filled 2019-05-17 (×6): qty 2

## 2019-05-17 NOTE — Progress Notes (Signed)
PROGRESS NOTE    Christine Serrano  [id number] DOB: 08/21/1960 DOA: 05/15/2019 PCP: Ria Bush, MD    Brief Narrative:   Christine Serrano is a 58 year old Caucasian female with past medical history remarkable for COPD on 2 L nasal cannula baseline, chronic constipation, depression, history of cervical cancer, HLD and continued tobacco use disorder who presents with off and progressive shortness of breath.  Upon EMS arrival, they noted she was having significant dyspnea with expiratory wheezes and was administered albuterol and Solu-Medrol 125 mg IV.  Patient currently lives with roommate, who was recently tested for Covid-19 last week which was negative.  She has been using her home inhalers and nebs more frequently.  Has not seen her outpatient pulmonologist in more than 1 year.  And she endorses continued tobacco abuse with 1 pack/day.    Assessment & Plan:   Active Problems:   Chronic constipation   Tobacco abuse   HLD (hyperlipidemia)   COPD exacerbation (HCC)   COPD with acute exacerbation (HCC)   Acute on chronic hypoxic respiratory failure Acute COPD exacerbation Patient presenting with progressive cough and shortness of breath complicated by her continued tobacco abuse disorder.  Rapid Covid-19 test negative, patient is afebrile without leukocytosis.  ABG pH 7.4/PaCO2 52.1/PaO2 192.  Chest x-ray with COPD and chronic changes no active cardiopulmonary disease process. Covid-19/SARS-CoV-2 PCR negative. --Respiratory viral panel: Pending --Azithromycin 500 mg daily, plan 5-day course --Continue prednisone 40 mg p.o. daily x5 days --Pulmicort twice daily --Albuterol/DuoNeb's prn --Continue supplemental oxygen, maintain SPO2 greater than 88%  Depression --Sertraline 100 mg p.o. nightly for prion 100 mg p.o. daily  Insomnia --Trazodone 50 mg nightly  Tobacco use disorder Patient endorses continued 1 pack/day.  Discussed with patient need for complete cessation  given her severe lung disease from underlying COPD. --Nicotine patch  GERD: Continue Protonix 40 mg p.o. daily  IBS with constipation predominant: Continue Linzess   DVT prophylaxis: Lovenox Code Status: DNR Family Communication: None Disposition Plan: Continue inpatient, anticipate discharge home when medically ready, likely 1-2 days.  Currently lives with roommate and on 2 L supplemental oxygen baseline.   Consultants:   None  Procedures:   None  Antimicrobials:   Azithromycin 11/28>>   Subjective: Patient seen and examined at bedside, resting comfortably.  Continues with extensive wheezing and dyspnea; which is still worse than her normal baseline.  Although she reports improved since yesterday.  She would like to transition her inhalers to neb treatments now that her Covid-19 PCR has returned negative.  No other complaints or concerns at this time. Denies headache, no dizziness, no chest pain, no nausea/vomiting/diarrhea, no abdominal pain, no weakness, fatigue, no paresthesias.  No acute events overnight per nursing staff.  Objective: Vitals:   05/16/19 2358 05/17/19 0453 05/17/19 0453 05/17/19 0815  BP: 107/61 (!) 114/93  131/85  Pulse: 79 84 80 75  Resp: 16 18  20   Temp: 98.5 F (36.9 C)  98.1 F (36.7 C) 98.4 F (36.9 C)  TempSrc: Oral  Oral Oral  SpO2: 100% 100% 100% 100%  Weight:      Height:        Intake/Output Summary (Last 24 hours) at 05/17/2019 1251 Last data filed at 05/17/2019 [id number] Gross per 24 hour  Intake 754.2 ml  Output 500 ml  Net 254.2 ml   Filed Weights   05/16/19 0300  Weight: 50.5 kg    Examination:  General exam: Appears calm and comfortable, frail/thin in appearance Respiratory system:  Breath sounds slightly decreased bilateral bases with mid to late expiratory wheezing throughout, no crackles, normal respiratory effort, on 2 L nasal cannula (home 2L) Cardiovascular system: S1 & S2 heard, RRR. No JVD, murmurs, rubs, gallops or  clicks. No pedal edema. Gastrointestinal system: Abdomen is nondistended, soft and nontender. No organomegaly or masses felt. Normal bowel sounds heard. Central nervous system: Alert and oriented. No focal neurological deficits. Extremities: Symmetric 5 x 5 power. Skin: No rashes, lesions or ulcers Psychiatry: Judgement and insight appear normal. Mood & affect appropriate.     Data Reviewed: I have personally reviewed following labs and imaging studies  CBC: Recent Labs  Lab 05/15/19 1948 05/16/19 0452 05/17/19 0326  WBC 7.7 3.1* 5.5  NEUTROABS 4.1  --   --   HGB 12.2 12.0 11.0*  HCT 36.4 37.7 34.7*  MCV 88.1 89.3 90.4  PLT 221 252 0000000   Basic Metabolic Panel: Recent Labs  Lab 05/15/19 1948 05/15/19 2330 05/16/19 0452 05/17/19 0326  NA 138  --  138 141  K 3.3*  --  5.4* 3.9  CL 99  --  103 107  CO2 27  --  29 29  GLUCOSE 119*  --  139* 109*  BUN 7  --  9 18  CREATININE 0.52  --  0.52 0.71  CALCIUM 9.2  --  8.8* 8.7*  MG  --  2.0 2.3  --   PHOS  --   --  2.9  --    GFR: Estimated Creatinine Clearance: 59.6 mL/min (by C-G formula based on SCr of 0.71 mg/dL). Liver Function Tests: Recent Labs  Lab 05/16/19 0452  AST 22  ALT 12  ALKPHOS 74  BILITOT 0.4  PROT 7.0  ALBUMIN 4.2   No results for input(s): LIPASE, AMYLASE in the last 168 hours. No results for input(s): AMMONIA in the last 168 hours. Coagulation Profile: No results for input(s): INR, PROTIME in the last 168 hours. Cardiac Enzymes: No results for input(s): CKTOTAL, CKMB, CKMBINDEX, TROPONINI in the last 168 hours. BNP (last 3 results) No results for input(s): PROBNP in the last 8760 hours. HbA1C: No results for input(s): HGBA1C in the last 72 hours. CBG: No results for input(s): GLUCAP in the last 168 hours. Lipid Profile: No results for input(s): CHOL, HDL, LDLCALC, TRIG, CHOLHDL, LDLDIRECT in the last 72 hours. Thyroid Function Tests: Recent Labs    05/16/19 0452  TSH 0.469   Anemia  Panel: No results for input(s): VITAMINB12, FOLATE, FERRITIN, TIBC, IRON, RETICCTPCT in the last 72 hours. Sepsis Labs: No results for input(s): PROCALCITON, LATICACIDVEN in the last 168 hours.  Recent Results (from the past 240 hour(s))  SARS CORONAVIRUS 2 (TAT 6-24 HRS) Nasopharyngeal Nasopharyngeal Swab     Status: None   Collection Time: 05/15/19 10:41 PM   Specimen: Nasopharyngeal Swab  Result Value Ref Range Status   SARS Coronavirus 2 NEGATIVE NEGATIVE Final    Comment: (NOTE) SARS-CoV-2 target nucleic acids are NOT DETECTED. The SARS-CoV-2 RNA is generally detectable in upper and lower respiratory specimens during the acute phase of infection. Negative results do not preclude SARS-CoV-2 infection, do not rule out co-infections with other pathogens, and should not be used as the sole basis for treatment or other patient management decisions. Negative results must be combined with clinical observations, patient history, and epidemiological information. The expected result is Negative. Fact Sheet for Patients: SugarRoll.be Fact Sheet for Healthcare Providers: https://www.woods-mathews.com/ This test is not yet approved or cleared by the  [place] Architectural technologist and  has been authorized for detection and/or diagnosis of SARS-CoV-2 by FDA under an Print production planner (EUA). This EUA will remain  in effect (meaning this test can be used) for the duration of the COVID-19 declaration under Section 56 4(b)(1) of the Act, 21 U.S.C. section 360bbb-3(b)(1), unless the authorization is terminated or revoked sooner. Performed at [facility], [place] [address] [place], [place] [address]          Radiology Studies: Dg Chest Port 1 View  Result Date: 05/15/2019 CLINICAL DATA:  Shortness of breath EXAM: PORTABLE CHEST 1 VIEW COMPARISON:  09/02/2018 FINDINGS: There is hyperinflation of the lungs compatible with COPD. Scarring in the  upper lobes. Heart is normal size. No effusions or acute bony abnormality. IMPRESSION: COPD/chronic changes.  No active disease. Electronically Signed   By: Rolm Baptise M.D.   On: 05/15/2019 19:40        Scheduled Meds: . aspirin EC  81 mg Oral Daily  . azithromycin  500 mg Oral Daily  . budesonide (PULMICORT) nebulizer solution  0.25 mg Nebulization BID  . buPROPion  100 mg Oral Daily  . enoxaparin (LOVENOX) injection  40 mg Subcutaneous Daily  . fluticasone  2 spray Each Nare Daily  . gabapentin  100 mg Oral BID  . ipratropium-albuterol  3 mL Nebulization Q6H  . nicotine  21 mg Transdermal Daily  . pantoprazole  40 mg Oral Daily  . predniSONE  40 mg Oral Q breakfast  . sertraline  100 mg Oral QHS  . sodium chloride flush  3 mL Intravenous Q12H  . traZODone  50 mg Oral QHS   Continuous Infusions:   LOS: 1 day    Time spent: 32 minutes spent on chart review, discussion with nursing staff, consultants, updating family and interview/physical exam; more than 50% of that time was spent in counseling and/or coordination of care.    Eric J [place] DO [facility] 05/17/2019, 12:51 PM

## 2019-05-18 LAB — RESPIRATORY PANEL BY PCR

## 2019-05-18 NOTE — Progress Notes (Signed)
Physical Therapy Treatment Patient Details Name: Christine Serrano MRN: [id number] DOB: March 07, 1961 Today's Date: 05/18/2019    History of Present Illness 58 y o female as admitted for cough/sob and constipation. PMH:  COPD, on 2 liters at baseline, depression and cervical CA    PT Comments    Pt able to increase ambulation without AD and with stable O2 sats on 2 LPM O2 (her baseline). She ambulated 100' and supervision.  Encouraged frequent short bouts of ambulation at home.  Cont POC.    Follow Up Recommendations  No PT follow up     Equipment Recommendations  None recommended by PT    Recommendations for Other Services       Precautions / Restrictions Precautions Precautions: Fall    Mobility  Bed Mobility Overal bed mobility: Independent                Transfers Overall transfer level: Needs assistance Equipment used: None Transfers: Sit to/from Stand Sit to Stand: Supervision            Ambulation/Gait Ambulation/Gait assistance: Supervision Gait Distance (Feet): 100 Feet Assistive device: None Gait Pattern/deviations: Step-through pattern Gait velocity: decreased   General Gait Details: occasional use of hand rail, no LOB, vitals monitored   Stairs             Wheelchair Mobility    Modified Rankin (Stroke Patients Only)       Balance Overall balance assessment: Needs assistance Sitting-balance support: No upper extremity supported;Feet supported Sitting balance-Leahy Scale: Normal     Standing balance support: No upper extremity supported Standing balance-Leahy Scale: Good                              Cognition Arousal/Alertness: Awake/alert Behavior During Therapy: WFL for tasks assessed/performed Overall Cognitive Status: Within Functional Limits for tasks assessed                                        Exercises      General Comments General comments (skin integrity, edema, etc.): On 2  LPM with sats 99-100% rest and activity (pt on 2 LPM at home so did not test on RA)      Pertinent Vitals/Pain Pain Assessment: No/denies pain    Home Living                      Prior Function            PT Goals (current goals can now be found in the care plan section) Progress towards PT goals: Progressing toward goals    Frequency    Min 3X/week      PT Plan Current plan remains appropriate    Co-evaluation              AM-PAC PT "6 Clicks" Mobility   Outcome Measure  Help needed turning from your back to your side while in a flat bed without using bedrails?: None Help needed moving from lying on your back to sitting on the side of a flat bed without using bedrails?: None Help needed moving to and from a bed to a chair (including a wheelchair)?: None Help needed standing up from a chair using your arms (e.g., wheelchair or bedside chair)?: None Help needed to walk in hospital room?: None Help needed climbing  3-5 steps with a railing? : A Little 6 Click Score: 23    End of Session Equipment Utilized During Treatment: Gait belt;Oxygen Activity Tolerance: Patient tolerated treatment well Patient left: in bed;with call bell/phone within reach;with bed alarm set;with family/visitor present Nurse Communication: Mobility status PT Visit Diagnosis: Unsteadiness on feet (R26.81);Difficulty in walking, not elsewhere classified (R26.2)     Time: 1040-1057 PT Time Calculation (min) (ACUTE ONLY): 17 min  Charges:  $Gait Training: 8-22 mins                     Christine Serrano, PT Acute Rehab Services Pager [phone] [facility] [phone]    Christine Serrano 05/18/2019, 11:17 AM

## 2019-05-18 NOTE — Progress Notes (Signed)
PROGRESS NOTE    Christine Serrano  [id number] DOB: 19-Sep-1960 DOA: 05/15/2019 PCP: Ria Bush, MD    Brief Narrative:   Christine Serrano is a 58 year old Caucasian female with past medical history remarkable for COPD on 2 L nasal cannula baseline, chronic constipation, depression, history of cervical cancer, HLD and continued tobacco use disorder who presents with off and progressive shortness of breath.  Upon EMS arrival, they noted she was having significant dyspnea with expiratory wheezes and was administered albuterol and Solu-Medrol 125 mg IV.  Patient currently lives with roommate, who was recently tested for Covid-19 last week which was negative.  She has been using her home inhalers and nebs more frequently.  Has not seen her outpatient pulmonologist in more than 1 year.  And she endorses continued tobacco abuse with 1 pack/day.    Assessment & Plan:   Active Problems:   Chronic constipation   Tobacco abuse   HLD (hyperlipidemia)   COPD exacerbation (HCC)   COPD with acute exacerbation (HCC)   Acute on chronic hypoxic respiratory failure Acute COPD exacerbation Patient presenting with progressive cough and shortness of breath complicated by her continued tobacco abuse disorder.  Rapid Covid-19 test negative, patient is afebrile without leukocytosis.  ABG pH 7.4/PaCO2 52.1/PaO2 192.  Chest x-ray with COPD and chronic changes no active cardiopulmonary disease process. Covid-19/SARS-CoV-2 PCR negative. --Respiratory viral panel: Pending --Azithromycin 500 mg daily, plan 5-day course --Continue prednisone 40 mg p.o. daily x5 days --Pulmicort twice daily --Albuterol/DuoNeb's prn --Continue supplemental oxygen, maintain SPO2 greater than 88% (on home O2 at 2L per [place])  Depression --Sertraline 100 mg p.o. daily and bupropion 100 mg p.o. daily  Insomnia --Trazodone 50 mg nightly  Tobacco use disorder Patient endorses continued 1 pack/day.  Discussed with patient  need for complete cessation given her severe lung disease from underlying COPD. --Nicotine patch  GERD: Continue Protonix 40 mg p.o. daily  IBS with constipation predominant: Continue Linzess   DVT prophylaxis: Lovenox Code Status: DNR Family Communication: None Disposition Plan: Continue inpatient, anticipate discharge home when medically ready, likely 1-2 days.  Currently lives with roommate and on 2 L supplemental oxygen baseline.   Consultants:   None  Procedures:   None  Antimicrobials:   Azithromycin 11/28>>   Subjective: Patient seen and examined at bedside, resting comfortably.  Continues with significant dyspnea which is worse than her normal baseline.  Has not ambulated much since admission and feels extremely weak and debilitated. No other complaints or concerns at this time. Denies headache, no dizziness, no chest pain, no nausea/vomiting/diarrhea, no abdominal pain, no paresthesias.  No acute events overnight per nursing staff.  Objective: Vitals:   05/17/19 2003 05/17/19 2027 05/18/19 0544 05/18/19 0812  BP:  92/69 127/78   Pulse:  88 76   Resp:  20 20   Temp:  98.1 F (36.7 C) 98.4 F (36.9 C)   TempSrc:  Oral Oral   SpO2: 100% 100% 100% 98%  Weight:      Height:        Intake/Output Summary (Last 24 hours) at 05/18/2019 1106 Last data filed at 05/17/2019 1647 Gross per 24 hour  Intake -  Output 250 ml  Net -250 ml   Filed Weights   05/16/19 0300  Weight: 50.5 kg    Examination:  General exam: Appears calm and comfortable, frail/thin in appearance Respiratory system: Breath sounds slightly decreased bilateral bases with mid to late expiratory wheezing throughout, no crackles, normal respiratory effort,  on 2 L nasal cannula (home 2L) Cardiovascular system: S1 & S2 heard, RRR. No JVD, murmurs, rubs, gallops or clicks. No pedal edema. Gastrointestinal system: Abdomen is nondistended, soft and nontender. No organomegaly or masses felt. Normal  bowel sounds heard. Central nervous system: Alert and oriented. No focal neurological deficits. Extremities: Symmetric 5 x 5 power. Skin: No rashes, lesions or ulcers Psychiatry: Judgement and insight appear normal. Mood & affect appropriate.     Data Reviewed: I have personally reviewed following labs and imaging studies  CBC: Recent Labs  Lab 05/15/19 1948 05/16/19 0452 05/17/19 0326  WBC 7.7 3.1* 5.5  NEUTROABS 4.1  --   --   HGB 12.2 12.0 11.0*  HCT 36.4 37.7 34.7*  MCV 88.1 89.3 90.4  PLT 221 252 0000000   Basic Metabolic Panel: Recent Labs  Lab 05/15/19 1948 05/15/19 2330 05/16/19 0452 05/17/19 0326  NA 138  --  138 141  K 3.3*  --  5.4* 3.9  CL 99  --  103 107  CO2 27  --  29 29  GLUCOSE 119*  --  139* 109*  BUN 7  --  9 18  CREATININE 0.52  --  0.52 0.71  CALCIUM 9.2  --  8.8* 8.7*  MG  --  2.0 2.3  --   PHOS  --   --  2.9  --    GFR: Estimated Creatinine Clearance: 59.6 mL/min (by C-G formula based on SCr of 0.71 mg/dL). Liver Function Tests: Recent Labs  Lab 05/16/19 0452  AST 22  ALT 12  ALKPHOS 74  BILITOT 0.4  PROT 7.0  ALBUMIN 4.2   No results for input(s): LIPASE, AMYLASE in the last 168 hours. No results for input(s): AMMONIA in the last 168 hours. Coagulation Profile: No results for input(s): INR, PROTIME in the last 168 hours. Cardiac Enzymes: No results for input(s): CKTOTAL, CKMB, CKMBINDEX, TROPONINI in the last 168 hours. BNP (last 3 results) No results for input(s): PROBNP in the last 8760 hours. HbA1C: No results for input(s): HGBA1C in the last 72 hours. CBG: No results for input(s): GLUCAP in the last 168 hours. Lipid Profile: No results for input(s): CHOL, HDL, LDLCALC, TRIG, CHOLHDL, LDLDIRECT in the last 72 hours. Thyroid Function Tests: Recent Labs    05/16/19 0452  TSH 0.469   Anemia Panel: No results for input(s): VITAMINB12, FOLATE, FERRITIN, TIBC, IRON, RETICCTPCT in the last 72 hours. Sepsis Labs: No results  for input(s): PROCALCITON, LATICACIDVEN in the last 168 hours.  Recent Results (from the past 240 hour(s))  SARS CORONAVIRUS 2 (TAT 6-24 HRS) Nasopharyngeal Nasopharyngeal Swab     Status: None   Collection Time: 05/15/19 10:41 PM   Specimen: Nasopharyngeal Swab  Result Value Ref Range Status   SARS Coronavirus 2 NEGATIVE NEGATIVE Final    Comment: (NOTE) SARS-CoV-2 target nucleic acids are NOT DETECTED. The SARS-CoV-2 RNA is generally detectable in upper and lower respiratory specimens during the acute phase of infection. Negative results do not preclude SARS-CoV-2 infection, do not rule out co-infections with other pathogens, and should not be used as the sole basis for treatment or other patient management decisions. Negative results must be combined with clinical observations, patient history, and epidemiological information. The expected result is Negative. Fact Sheet for Patients: SugarRoll.be Fact Sheet for Healthcare Providers: https://www.woods-mathews.com/ This test is not yet approved or cleared by the [place] FDA and  has been authorized for detection and/or diagnosis of SARS-CoV-2 by FDA under an  Emergency Use Authorization (EUA). This EUA will remain  in effect (meaning this test can be used) for the duration of the COVID-19 declaration under Section 56 4(b)(1) of the Act, 21 U.S.C. section 360bbb-3(b)(1), unless the authorization is terminated or revoked sooner. Performed at [facility], [place] [address], [place], [place] [address]          Radiology Studies: No results found.      Scheduled Meds: . aspirin EC  81 mg Oral Daily  . azithromycin  500 mg Oral Daily  . budesonide (PULMICORT) nebulizer solution  0.25 mg Nebulization BID  . buPROPion  100 mg Oral Daily  . enoxaparin (LOVENOX) injection  40 mg Subcutaneous Daily  . fluticasone  2 spray Each Nare Daily  . gabapentin  100 mg Oral BID  .  ipratropium-albuterol  3 mL Nebulization TID  . nicotine  21 mg Transdermal Daily  . pantoprazole  40 mg Oral Daily  . predniSONE  40 mg Oral Q breakfast  . sertraline  100 mg Oral QHS  . sodium chloride flush  3 mL Intravenous Q12H  . traZODone  50 mg Oral QHS   Continuous Infusions:   LOS: 2 days    Time spent: 31 minutes spent on chart review, discussion with nursing staff, consultants, updating family and interview/physical exam; more than 50% of that time was spent in counseling and/or coordination of care.     J [place] DO [facility] 05/18/2019, 11:06 AM

## 2019-05-19 ENCOUNTER — Ambulatory Visit: Payer: Medicare Other

## 2019-05-19 DIAGNOSIS — J441 Chronic obstructive pulmonary disease with (acute) exacerbation: Principal | ICD-10-CM

## 2019-05-19 LAB — BASIC METABOLIC PANEL
Anion gap: 7 (ref 5–15)
BUN: 17 mg/dL (ref 6–20)
CO2: 30 mmol/L (ref 22–32)
Calcium: 8.8 mg/dL — ABNORMAL LOW (ref 8.9–10.3)
Chloride: 104 mmol/L (ref 98–111)
Creatinine, Ser: 0.59 mg/dL (ref 0.44–1.00)
GFR calc Af Amer: >60 mL/min (ref >60)
GFR calc non Af Amer: >60 mL/min (ref >60)
Glucose, Bld: 98 mg/dL (ref 70–99)
Potassium: 3.7 mmol/L (ref 3.5–5.1)
Sodium: 141 mmol/L (ref 135–145)

## 2019-05-19 LAB — CBC
HCT: 35.2 % — ABNORMAL LOW (ref 36.0–46.0)
Hemoglobin: 11.1 g/dL — ABNORMAL LOW (ref 12.0–15.0)
MCH: 28.5 pg (ref 26.0–34.0)
MCHC: 31.5 g/dL (ref 30.0–36.0)
MCV: 90.5 fL (ref 80.0–100.0)
Platelets: 193 10*3/uL (ref 150–400)
RBC: 3.89 MIL/uL (ref 3.87–5.11)
RDW: 14.9 % (ref 11.5–15.5)
WBC: 5 10*3/uL (ref 4.0–10.5)
nRBC: 0 % (ref 0.0–0.2)

## 2019-05-19 MED ORDER — POLYETHYLENE GLYCOL 3350 17 G PO PACK
17.0000 g | PACK | Freq: Every day | ORAL | Status: DC
  Administered 2019-05-19 – 2019-05-20 (×2): 17 g via ORAL
  Filled 2019-05-19 (×2): qty 1

## 2019-05-19 MED ORDER — METHYLPREDNISOLONE SODIUM SUCC 125 MG IJ SOLR
60.0000 mg | Freq: Two times a day (BID) | INTRAMUSCULAR | Status: DC
  Administered 2019-05-19 – 2019-05-20 (×2): 60 mg via INTRAVENOUS
  Filled 2019-05-19 (×2): qty 2

## 2019-05-19 MED ORDER — METHYLPREDNISOLONE SODIUM SUCC 125 MG IJ SOLR
60.0000 mg | Freq: Two times a day (BID) | INTRAMUSCULAR | Status: DC
  Filled 2019-05-19: qty 2

## 2019-05-19 NOTE — Plan of Care (Signed)
  Problem: Clinical Measurements: Goal: Diagnostic test results will improve Outcome: Progressing Goal: Respiratory complications will improve Outcome: Progressing   Problem: Activity: Goal: Risk for activity intolerance will decrease Outcome: Progressing   Problem: Pain Managment: Goal: General experience of comfort will improve Outcome: Progressing

## 2019-05-19 NOTE — Progress Notes (Signed)
PROGRESS NOTE  Christine Serrano  DOB: 12-23-1960  PCP: Ria Bush, MD [id number]  DOA: 05/15/2019  LOS: 3 days   No chief complaint on file.  Brief narrative: Christine Serrano is a 58 year old Caucasian female with PMH remarkable for COPD on 2 L nasal cannula baseline, chronic constipation, depression, history of cervical cancer, HLD and continues to smoke 1 pack/day.  Patient presented to the ED on 11/28 with complaints of progressive shortness of breath.  Upon EMS arrival, they noted she was having significant dyspnea with expiratory wheezes not responding to inhalers and nebs at home.   In the ED, patient was afebrile. WBC count normal. Covid PCR negative. Respiratory virus panel negative. ABG pH 7.4/PaCO2 52.1/PaO2 192.   Chest x-ray with COPD and chronic changes no active cardiopulmonary disease process.   Subjective: Patient was seen and examined this morning.  Propped up in bed.  Continues to have mild to moderate diffuse wheezing bilaterally.  Assessment/Plan: Acute on chronic hypoxic respiratory failure Acute COPD exacerbation -Patient presenting with progressive cough and shortness of breath complicated by her continued tobacco abuse disorder. -Currently on azithromycin 5 mg daily.  Continue the same. -Patient is currently prednisone 40 mg daily.  Because of her persistent bilateral diffuse wheezing, I switched her to Solu-Medrol 60 mg twice daily. -Continue Pulmicort, albuterol/DuoNeb. -Continue supplemental oxygen to keep O2 sat more than 88%. -Patient is 2 L oxygen at home all the time.   Depression --Sertraline 100 mg p.o. daily and bupropion 100 mg p.o. daily  Insomnia --Trazodone 50 mg nightly  Tobacco use disorder Patient endorses continued 1 pack/day.  Discussed with patient need for complete cessation given her severe lung disease from underlying COPD. --Nicotine patch  GERD: Continue Protonix 40 mg p.o. daily  IBS with constipation  predominant: Continue Linzess.  Patient states he had a small bowel movement yesterday.  The last time she had a good bowel movement was 2 weeks ago.  Patient agrees to take MiraLAX today.  Mobility: Encourage ambulation. Diet: Regular diet Fluid: Not on IV fluid DVT prophylaxis:  Lovenox Code Status:   Code Status: DNR  Family Communication:  None at bedside Expected Discharge:  Anticipate discharge home tomorrow  Consultants:  None  Procedures:  None  Antimicrobials: Anti-infectives (From admission, onward)   Start     Dose/Rate Route Frequency Ordered Stop   05/17/19 0330  azithromycin (ZITHROMAX) tablet 500 mg     500 mg Oral Daily 05/16/19 0320 05/21/19 0959   05/16/19 0320  azithromycin (ZITHROMAX) 500 mg in sodium chloride 0.9 % 250 mL IVPB     500 mg 250 mL/hr over 60 Minutes Intravenous Every 24 hours 05/16/19 0320 05/16/19 0546      Diet Order            Diet regular Room service appropriate? Yes; Fluid consistency: Thin  Diet effective now              Infusions:    Scheduled Meds: . aspirin EC  81 mg Oral Daily  . azithromycin  500 mg Oral Daily  . budesonide (PULMICORT) nebulizer solution  0.25 mg Nebulization BID  . buPROPion  100 mg Oral Daily  . enoxaparin (LOVENOX) injection  40 mg Subcutaneous Daily  . fluticasone  2 spray Each Nare Daily  . gabapentin  100 mg Oral BID  . ipratropium-albuterol  3 mL Nebulization TID  . methylPREDNISolone (SOLU-MEDROL) injection  60 mg Intravenous Q12H  . nicotine  21 mg Transdermal  Daily  . pantoprazole  40 mg Oral Daily  . sertraline  100 mg Oral QHS  . sodium chloride flush  3 mL Intravenous Q12H  . traZODone  50 mg Oral QHS    PRN meds: acetaminophen **OR** acetaminophen, HYDROcodone-acetaminophen, hydrOXYzine, linaclotide, ondansetron **OR** ondansetron (ZOFRAN) IV   Objective: Vitals:   05/19/19 0743 05/19/19 1501  BP:  99/64  Pulse:  90  Resp:  (!) 22  Temp:  98 F (36.7 C)  SpO2: 97% 100%     Intake/Output Summary (Last 24 hours) at 05/19/2019 1542 Last data filed at 05/19/2019 0600 Gross per 24 hour  Intake 120 ml  Output 500 ml  Net -380 ml   Filed Weights   05/16/19 0300  Weight: 50.5 kg   Weight change:  Body mass index is 19.11 kg/m.   Physical Exam: General exam: Appears calm and comfortable.  Not in respiratory distress at rest Skin: No rashes, lesions or ulcers. HEENT: Atraumatic, normocephalic, supple neck, no obvious bleeding Lungs: Mild to moderate diffuse bilateral wheezing CVS: Regular rate, rhythm, no murmur GI/Abd soft, nontender, nondistended, bowel sound present CNS: Alert, awake, oriented x3 Psychiatry: Depressed affect Extremities: No pedal edema, no calf tenderness  Data Review: I have personally reviewed the laboratory data and studies available.  Recent Labs  Lab 05/15/19 1948 05/16/19 0452 05/17/19 0326 05/19/19 0412  WBC 7.7 3.1* 5.5 5.0  NEUTROABS 4.1  --   --   --   HGB 12.2 12.0 11.0* 11.1*  HCT 36.4 37.7 34.7* 35.2*  MCV 88.1 89.3 90.4 90.5  PLT 221 252 193 193   Recent Labs  Lab 05/15/19 1948 05/15/19 2330 05/16/19 0452 05/17/19 0326 05/19/19 0412  NA 138  --  138 141 141  K 3.3*  --  5.4* 3.9 3.7  CL 99  --  103 107 104  CO2 27  --  29 29 30   GLUCOSE 119*  --  139* 109* 98  BUN 7  --  9 18 17   CREATININE 0.52  --  0.52 0.71 0.59  CALCIUM 9.2  --  8.8* 8.7* 8.8*  MG  --  2.0 2.3  --   --   PHOS  --   --  2.9  --   --     Terrilee Croak, MD  [facility] 05/19/2019

## 2019-05-19 NOTE — Care Management Important Message (Signed)
Important Message  Patient Details IM Letter given to Cookie McGibboney RN to present to the Patient Name: Christine Serrano MRN: [id number] Date of Birth: 07-Mar-1961   Medicare Important Message Given:  Yes     Kerin Salen 05/19/2019, 10:46 AM

## 2019-05-20 MED ORDER — PREDNISONE 10 MG PO TABS: ORAL_TABLET | ORAL | 0 refills | Status: DC

## 2019-05-20 NOTE — Progress Notes (Signed)
Patient discharged home, discharge instructions given and explained to patient, she verbalized understanding, patient denies any pain/distress. Accompanied home by roommate.

## 2019-05-20 NOTE — Discharge Summary (Signed)
Physician Discharge Summary  Christine Serrano [id number] DOB: [birth year] DOA: 05/15/2019  PCP: Ria Bush, MD  Admit date: 05/15/2019 Discharge date: 05/20/2019  Admitted From: Home Discharge disposition: Home   Code Status: DNR  Diet Recommendation: Regular diet   Recommendations for Outpatient Follow-Up:   1. Follow-up with PCP as an outpatient. 2. Follow-up with pulmonologist.  Discharge Diagnosis:   Active Problems:   Chronic constipation   Tobacco abuse   HLD (hyperlipidemia)   COPD exacerbation (HCC)   COPD with acute exacerbation (HCC)  History of Present Illness / Brief narrative:  Christine Mitrani Crawfordis a 58 year old Caucasian female with PMH remarkable for COPD on 2 L nasal cannula baseline, chronic constipation, depression, history of cervical cancer, HLD and continues to smoke 1 pack/day.  Patient presented to the ED on 11/28 with complaints of progressive shortness of breath. Upon EMS arrival, they noted she was having significant dyspnea with expiratory wheezes not responding to inhalers and nebs at home.   In the ED, patient was afebrile. WBC count normal. Covid PCR negative. Respiratory virus panel negative. ABG pH 7.4/PaCO2 52.1/PaO2 192.  Chest x-ray with COPD and chronic changes no active cardiopulmonary disease process.   Hospital Course:  Acute on chronic hypoxic respiratory failure Acute COPD exacerbation -Patient presenting with progressive cough and shortness of breath complicated by her continued tobacco abuse disorder. -Currently on azithromycin 5 mg daily. Completed 5-day course. -Patient is currently on Solu-Medrol 60 mg twice daily.  We will discharge her home on prednisone taper. -Continue Pulmicort, albuterol/DuoNeb. -Continue supplemental oxygen to keep O2 sat more than 88%. -Patient is 2 L oxygen at home all the time.   She was able to ambulate on the hallway today without symptoms on 2 L.  Depression -Sertraline 100 mg  p.o.daily and bupropion 100mg  p.o. daily  Insomnia -Trazodone 50 mg nightly  Tobacco use disorder -Patient endorses continued 1 pack/day. Discussed with patient need for complete cessation given her severe lung disease from underlying COPD. --Nicotine patch  GERD -Continue Protonix 40 mg p.o. daily  IBS with constipation predominant:Continue Linzess.    Stable for discharge to home today.  She already has oxygen at home. Subjective:  Seen and examined this morning.  Pleasant middle-aged Caucasian female.  Looks older for her age.  Not in respiratory distress today.  Discharge Exam:   Vitals:   05/20/19 0625 05/20/19 0824 05/20/19 1430 05/20/19 1443  BP: 139/86  136/81   Pulse: 83  98   Resp: 18  (!) 22   Temp: 98.1 F (36.7 C)  97.9 F (36.6 C)   TempSrc: Oral  Oral   SpO2: 100% 95% 100% 99%  Weight:      Height:        Body mass index is 19.11 kg/m.  General exam: Appears calm and comfortable.  Skin: No rashes, lesions or ulcers. HEENT: Atraumatic, normocephalic, supple neck, no obvious bleeding Lungs: No wheezing today, no crackles CVS: Regular rate and rhythm, no murmur GI/Abd soft, nontender, nondistended, bowel sound present CNS: Alert, awake monitor x3 Psychiatry: Mood appropriate Extremities: No pedal edema, no calf tenderness  Discharge Instructions:  Wound care: None Discharge Instructions    Increase activity slowly   Complete by: As directed      Follow-up Information    Ria Bush, MD Follow up.   Specialty: Family Medicine Contact information: [address] [place] [address] [phone]          Allergies as of  05/20/2019      Reactions   Imitrex [sumatriptan] Other (See Comments)   Worsens migraine       Medication List    TAKE these medications   aspirin EC 81 MG tablet Take 81 mg by mouth daily. Notes to patient: 05/21/19   buPROPion 100 MG 12 hr tablet Commonly known as: WELLBUTRIN SR TAKE 1  TABLET BY MOUTH EVERY DAY Notes to patient: 05/21/19   fluticasone 50 MCG/ACT nasal spray Commonly known as: FLONASE Place 2 sprays into both nostrils daily. Notes to patient: 05/21/19   gabapentin 100 MG capsule Commonly known as: NEURONTIN Take 1 capsule (100 mg total) by mouth 2 (two) times daily. Notes to patient: 05/20/19   hydrOXYzine 25 MG tablet Commonly known as: ATARAX/VISTARIL TAKE 1 TABLET (25 MG TOTAL) BY MOUTH 2 (TWO) TIMES DAILY AS NEEDED FOR ANXIETY.   ipratropium-albuterol 0.5-2.5 (3) MG/3ML Soln Commonly known as: DUONEB INHALE 3 MLS INTO THE LUNGS EVERY 6 (SIX) HOURS AS NEEDED.   Linzess 290 MCG Caps capsule Generic drug: linaclotide Take 1 capsule (290 mcg total) by mouth daily as needed (constipation).   Multivitamin Adult Tabs Take 1 tablet by mouth daily.   naproxen 375 MG tablet Commonly known as: NAPROSYN Take 1 tablet (375 mg total) by mouth 2 (two) times daily as needed for moderate pain.   nicotine 14 mg/24hr patch Commonly known as: Nicoderm CQ Place 1 patch (14 mg total) onto the skin daily. Notes to patient: 05/21/19   omeprazole 40 MG capsule Commonly known as: PRILOSEC TAKE 1 CAPSULE BY MOUTH DAILY. FOR THREE WEEKS THEN AS NEEDED. TAKE 30 MIN PRIOR TO LARGE MEAL   OXYGEN Inhale 3 L/min into the lungs continuous.   predniSONE 10 MG tablet Commonly known as: DELTASONE Take 4 tablets (40 mg) daily for 2 days, then, Take 3 tablets (30 mg) daily for 2 days, then, Take 2 tablets (20 mg) daily for 2 days, then, Take 1 tablets (10 mg) daily for 1 days, then stop Notes to patient: 05/21/19   ProAir HFA 108 (90 Base) MCG/ACT inhaler Generic drug: albuterol TAKE 2 PUFFS BY MOUTH EVERY 6 HOURS AS NEEDED FOR WHEEZE OR SHORTNESS OF BREATH   sertraline 100 MG tablet Commonly known as: ZOLOFT TAKE 1.5 TABLETS BY MOUTH DAILY What changed: See the new instructions.   Symbicort 160-4.5 MCG/ACT inhaler Generic drug: budesonide-formoterol Inhale 2  puffs into the lungs 2 (two) times daily.   tiotropium 18 MCG inhalation capsule Commonly known as: SPIRIVA Place 1 capsule (18 mcg total) into inhaler and inhale daily.   tiZANidine 2 MG tablet Commonly known as: ZANAFLEX TAKE 1 TABLET (2 MG TOTAL) BY MOUTH 2 (TWO) TIMES DAILY AS NEEDED FOR MUSCLE SPASMS (NECK PAIN).   traZODone 50 MG tablet Commonly known as: DESYREL Take 1 tablet (50 mg total) by mouth at bedtime. Notes to patient: 05/20/19   vitamin B-12 1000 MCG tablet Commonly known as: CYANOCOBALAMIN Take 1,000 mcg by mouth daily. Notes to patient: 05/21/19   vitamin E 400 UNIT capsule Take 400 Units by mouth daily. Notes to patient: 05/21/19       Time coordinating discharge: 35 minutes  The results of significant diagnostics from this hospitalization (including imaging, microbiology, ancillary and laboratory) are listed below for reference.    Procedures and Diagnostic Studies:   Dg Chest Port 1 View  Result Date: 05/15/2019 CLINICAL DATA:  Shortness of breath EXAM: PORTABLE CHEST 1 VIEW COMPARISON:  09/02/2018 FINDINGS: There is hyperinflation  of the lungs compatible with COPD. Scarring in the upper lobes. Heart is normal size. No effusions or acute bony abnormality. IMPRESSION: COPD/chronic changes.  No active disease. Electronically Signed   By: Rolm Baptise M.D.   On: 05/15/2019 19:40     Labs:   Basic Metabolic Panel: Recent Labs  Lab 05/15/19 1948 05/15/19 2330 05/16/19 0452 05/17/19 0326 05/19/19 0412  NA 138  --  138 141 141  K 3.3*  --  5.4* 3.9 3.7  CL 99  --  103 107 104  CO2 27  --  29 29 30   GLUCOSE 119*  --  139* 109* 98  BUN 7  --  9 18 17   CREATININE 0.52  --  0.52 0.71 0.59  CALCIUM 9.2  --  8.8* 8.7* 8.8*  MG  --  2.0 2.3  --   --   PHOS  --   --  2.9  --   --    GFR Estimated Creatinine Clearance: 59.6 mL/min (by C-G formula based on SCr of 0.59 mg/dL). Liver Function Tests: Recent Labs  Lab 05/16/19 0452  AST 22  ALT 12   ALKPHOS 74  BILITOT 0.4  PROT 7.0  ALBUMIN 4.2   No results for input(s): LIPASE, AMYLASE in the last 168 hours. No results for input(s): AMMONIA in the last 168 hours. Coagulation profile No results for input(s): INR, PROTIME in the last 168 hours.  CBC: Recent Labs  Lab 05/15/19 1948 05/16/19 0452 05/17/19 0326 05/19/19 0412  WBC 7.7 3.1* 5.5 5.0  NEUTROABS 4.1  --   --   --   HGB 12.2 12.0 11.0* 11.1*  HCT 36.4 37.7 34.7* 35.2*  MCV 88.1 89.3 90.4 90.5  PLT 221 252 193 193   Cardiac Enzymes: No results for input(s): CKTOTAL, CKMB, CKMBINDEX, TROPONINI in the last 168 hours. BNP: Invalid input(s): POCBNP CBG: No results for input(s): GLUCAP in the last 168 hours. D-Dimer No results for input(s): DDIMER in the last 72 hours. Hgb A1c No results for input(s): HGBA1C in the last 72 hours. Lipid Profile No results for input(s): CHOL, HDL, LDLCALC, TRIG, CHOLHDL, LDLDIRECT in the last 72 hours. Thyroid function studies No results for input(s): TSH, T4TOTAL, T3FREE, THYROIDAB in the last 72 hours.  Invalid input(s): FREET3 Anemia work up No results for input(s): VITAMINB12, FOLATE, FERRITIN, TIBC, IRON, RETICCTPCT in the last 72 hours. Microbiology Recent Results (from the past 240 hour(s))  SARS CORONAVIRUS 2 (TAT 6-24 HRS) Nasopharyngeal Nasopharyngeal Swab     Status: None   Collection Time: 05/15/19 10:41 PM   Specimen: Nasopharyngeal Swab  Result Value Ref Range Status   SARS Coronavirus 2 NEGATIVE NEGATIVE Final    Comment: (NOTE) SARS-CoV-2 target nucleic acids are NOT DETECTED. The SARS-CoV-2 RNA is generally detectable in upper and lower respiratory specimens during the acute phase of infection. Negative results do not preclude SARS-CoV-2 infection, do not rule out co-infections with other pathogens, and should not be used as the sole basis for treatment or other patient management decisions. Negative results must be combined with clinical observations,  patient history, and epidemiological information. The expected result is Negative. Fact Sheet for Patients: SugarRoll.be Fact Sheet for Healthcare Providers: https://www.woods-mathews.com/ This test is not yet approved or cleared by the [place] FDA and  has been authorized for detection and/or diagnosis of SARS-CoV-2 by FDA under an Emergency Use Authorization (EUA). This EUA will remain  in effect (meaning this test can be used) for  the duration of the COVID-19 declaration under Section 56 4(b)(1) of the Act, 21 U.S.C. section 360bbb-3(b)(1), unless the authorization is terminated or revoked sooner. Performed at [facility], [place] [address], [place], [place] [address]   Respiratory Panel by PCR     Status: None   Collection Time: 05/18/19  9:06 AM   Specimen: Nasopharyngeal Swab; Respiratory  Result Value Ref Range Status   Adenovirus NOT DETECTED NOT DETECTED Final   Coronavirus 229E NOT DETECTED NOT DETECTED Final    Comment: (NOTE) The Coronavirus on the Respiratory Panel, DOES NOT test for the novel  Coronavirus (2019 nCoV)    Coronavirus HKU1 NOT DETECTED NOT DETECTED Final   Coronavirus NL63 NOT DETECTED NOT DETECTED Final   Coronavirus OC43 NOT DETECTED NOT DETECTED Final   Metapneumovirus NOT DETECTED NOT DETECTED Final   Rhinovirus / Enterovirus NOT DETECTED NOT DETECTED Final   Influenza A NOT DETECTED NOT DETECTED Final   Influenza B NOT DETECTED NOT DETECTED Final   Parainfluenza Virus 1 NOT DETECTED NOT DETECTED Final   Parainfluenza Virus 2 NOT DETECTED NOT DETECTED Final   Parainfluenza Virus 3 NOT DETECTED NOT DETECTED Final   Parainfluenza Virus 4 NOT DETECTED NOT DETECTED Final   Respiratory Syncytial Virus NOT DETECTED NOT DETECTED Final   Bordetella pertussis NOT DETECTED NOT DETECTED Final   Chlamydophila pneumoniae NOT DETECTED NOT DETECTED Final   Mycoplasma pneumoniae NOT DETECTED NOT DETECTED Final     Comment: Performed at [facility], [place]. [address], [place], [place] [address]    Please note: You were cared for by a hospitalist during your hospital stay. Once you are discharged, your primary care physician will handle any further medical issues. Please note that NO REFILLS for any discharge medications will be authorized once you are discharged, as it is imperative that you return to your primary care physician (or establish a relationship with a primary care physician if you do not have one) for your post hospital discharge needs so that they can reassess your need for medications and monitor your lab values.  Signed: Terrilee Croak  [facility] 05/20/2019, 3:38 PM

## 2019-05-20 NOTE — TOC Progression Note (Signed)
Transition of Care [facility]) - Progression Note    Patient Details  Name: Christine Serrano MRN: [id number] Date of Birth: [birth year]  Transition of Care [facility]) CM/SW Contact  Purcell Mouton, RN Phone Number: 05/20/2019, 11:19 AM  Clinical Narrative:    Spoke with pt who is active with [employer] for Home O2. A call to [employer] for O2 to transport home. Pt states that her portable tank does not work at home.    Expected Discharge Plan: Home/Self Care Barriers to Discharge: No Barriers Identified  Expected Discharge Plan and Services Expected Discharge Plan: Home/Self Care   Discharge Planning Services: CM Consult Post Acute Care Choice: Durable Medical Equipment Living arrangements for the past 2 months: Single Family Home                                       Social Determinants of Health (SDOH) Interventions    Readmission Risk Interventions No flowsheet data found.

## 2019-05-21 ENCOUNTER — Telehealth: Payer: Self-pay

## 2019-05-21 ENCOUNTER — Ambulatory Visit: Payer: Medicare Other

## 2019-05-21 NOTE — Telephone Encounter (Signed)
Left message on voicemail for patient to return my call to complete TCM and schedule follow up visit.

## 2019-05-21 NOTE — Telephone Encounter (Signed)
Transition Care Management Follow-up Telephone Call  Date of discharge and from where: 05/20/2019 from [place]   How have you been since you were released from the hospital? Patient states that she is feeling much better and her breathing has improved a lot.   Any questions or concerns? No   Items Reviewed:  Did the pt receive and understand the discharge instructions provided? Yes   Medications obtained and verified? Yes   Any new allergies since your discharge? No   Dietary orders reviewed? Yes  Do you have support at home? Yes   Functional Questionnaire: (I = Independent and D = Dependent) ADLs: I  Bathing/Dressing- I  Meal Prep- I  Eating- I  Maintaining continence- I  Transferring/Ambulation- I  Managing Meds- I  Follow up appointments reviewed:   PCP Hospital f/u appt confirmed? Yes  Scheduled to see Dr. Danise Mina on 05/26/2019 @ 12 pm.  [facility] f/u appt confirmed? N/A  Are transportation arrangements needed? No   If their condition worsens, is the pt aware to call PCP or go to the Emergency Dept.? Yes  Was the patient provided with contact information for the PCP's office or ED? Yes  Was to pt encouraged to call back with questions or concerns? Yes

## 2019-05-22 DIAGNOSIS — J449 Chronic obstructive pulmonary disease, unspecified: Secondary | ICD-10-CM | POA: Diagnosis not present

## 2019-05-23 ENCOUNTER — Other Ambulatory Visit: Payer: Self-pay | Admitting: Family Medicine

## 2019-05-24 ENCOUNTER — Ambulatory Visit: Payer: Medicare Other

## 2019-05-24 DIAGNOSIS — H04123 Dry eye syndrome of bilateral lacrimal glands: Secondary | ICD-10-CM | POA: Diagnosis not present

## 2019-05-24 DIAGNOSIS — H2513 Age-related nuclear cataract, bilateral: Secondary | ICD-10-CM | POA: Diagnosis not present

## 2019-05-24 DIAGNOSIS — H524 Presbyopia: Secondary | ICD-10-CM | POA: Diagnosis not present

## 2019-05-26 ENCOUNTER — Ambulatory Visit: Payer: Medicare Other

## 2019-05-26 ENCOUNTER — Encounter: Payer: Self-pay | Admitting: Family Medicine

## 2019-05-26 ENCOUNTER — Telehealth: Payer: Self-pay | Admitting: Family Medicine

## 2019-05-26 ENCOUNTER — Other Ambulatory Visit: Payer: Self-pay

## 2019-05-26 ENCOUNTER — Ambulatory Visit (INDEPENDENT_AMBULATORY_CARE_PROVIDER_SITE_OTHER): Payer: Medicare Other | Admitting: Family Medicine

## 2019-05-26 VITALS — BP 116/68 | HR 108 | Temp 97.9°F | Ht 64.0 in | Wt 115.4 lb

## 2019-05-26 DIAGNOSIS — Z720 Tobacco use: Secondary | ICD-10-CM

## 2019-05-26 DIAGNOSIS — F331 Major depressive disorder, recurrent, moderate: Secondary | ICD-10-CM | POA: Diagnosis not present

## 2019-05-26 DIAGNOSIS — J439 Emphysema, unspecified: Secondary | ICD-10-CM | POA: Diagnosis not present

## 2019-05-26 DIAGNOSIS — J441 Chronic obstructive pulmonary disease with (acute) exacerbation: Secondary | ICD-10-CM | POA: Diagnosis not present

## 2019-05-26 MED ORDER — SERTRALINE HCL 100 MG PO TABS: 150.0000 mg | ORAL_TABLET | Freq: Every day | ORAL | 1 refills | Status: DC

## 2019-05-26 MED ORDER — ALBUTEROL SULFATE (2.5 MG/3ML) 0.083% IN NEBU: 2.5000 mg | INHALATION_SOLUTION | Freq: Four times a day (QID) | RESPIRATORY_TRACT | 1 refills | Status: DC | PRN

## 2019-05-26 MED ORDER — BUPROPION HCL ER (SR) 100 MG PO TB12: 100.0000 mg | ORAL_TABLET | Freq: Every day | ORAL | 1 refills | Status: DC

## 2019-05-26 MED ORDER — NICOTINE 14 MG/24HR TD PT24: 14.0000 mg | MEDICATED_PATCH | Freq: Every day | TRANSDERMAL | 1 refills | Status: DC

## 2019-05-26 NOTE — Assessment & Plan Note (Signed)
Stable period on current regimen of sertraline 150mg  daily, wellbutrin 100mg  daily - refilled today.

## 2019-05-26 NOTE — Assessment & Plan Note (Signed)
Increased smoking to 2 ppd after roommate's recent death. Support provided. Reviewed importance of full smoking cessation in setting of severe O2 dependent COPD to maintain lung function as much as able - she has requested 90d supply of nicoderm CQ 14mg /day patches and she will renew smoking cessation efforts.

## 2019-05-26 NOTE — Patient Instructions (Addendum)
Call lung doctor office to schedule follow up.  Continue symbicort and spiriva.  Back off duonebs, just use albuterol neb - sent to pharmacy.  Spiriva has same medicine as one of the ingredients in duoneb.  Work on quitting smoking! This is the best thing you can do for your breathing.

## 2019-05-26 NOTE — Assessment & Plan Note (Signed)
Severe oxygen dependent COPD. She has continued spiriva, symbicort, duonebs QID at this time. Discussed redundancy of duonebs with spiriva - rec switch to albuterol nebulizer only - sent to pharmacy.  Reviewed importance of smoking cessation to maintain adequate lung function.  Last saw pulm 04/2018, overdue for f/u - encouraged she call to schedule appt with new provider (prior pulmonologist has moved).

## 2019-05-26 NOTE — Addendum Note (Signed)
Addended by: Brenton Grills on: [id number] PM   Modules accepted: Orders

## 2019-05-26 NOTE — Assessment & Plan Note (Addendum)
Recent hospitalization reviewed with patient. She has completed antibiotic and prednisone taper. Ongoing dyspnea but wheezing, cough improved. No need for further prednisone or antibiotic.

## 2019-05-26 NOTE — Telephone Encounter (Signed)
Pt returning call. States she does not start with [facility] pharmacy until 06/2019.  She needs albuterol and bupropion sent to [facility].  The Nicoderm patch she's trying to get approved through [facility] [employer] to get OTC.  She provided their phn # [phone] to call.   Called [facility] to c/x rxs.  Sent albuterol and bupropion to [facility].  Pt aware.  Attempted to call number above for Nicoderm patch.  Hold time is 20 mins.  Will try again tomorrow.

## 2019-05-26 NOTE — Telephone Encounter (Signed)
Spoke with [facility] to call in failed rx.  States they received all rxs sent in.  They need pt to call cust svc at [phone] to confirm the address the rxs will be sent to.  Lvm for pt to call back.  Need to relay message above.

## 2019-05-26 NOTE — Telephone Encounter (Signed)
plz phone in nicoderm CQ to [place] due to E prescribe failure.

## 2019-05-26 NOTE — Telephone Encounter (Signed)
Spoke wit pt confirming she is not using cream.  Pt states she does not need this med.  Denied refill.

## 2019-05-26 NOTE — Progress Notes (Signed)
This visit was conducted in person.  BP 116/68 (BP Location: Right Arm, Patient Position: Sitting, Cuff Size: Normal)   Pulse (!) 108   Temp 97.9 F (36.6 C) (Temporal)   Ht 5\' 4"  (1.626 m)   Wt 115 lb 7 oz (52.4 kg)   SpO2 98% Comment: 3 L  BMI 19.81 kg/m    CC: hosp f/u visit Subjective:    Patient ID: Christine Serrano, female    DOB: [birth year], 58 y.o.   MRN: [id number]  HPI: Christine Serrano is a 58 y.o. female presenting on 05/26/2019 for Hospitalization Follow-up and Medication Refill (Requests new rx for Nicoderm patch, 3 doses, be sent to [facility]. )   Roommate just died from sepsis (homeless man they had taken in).   Recent hospitalization for COPD exacerbation, fortunately tested negative for Covid19 infection. She also had negative respiratory virus panel. CXR without pneumonia. Treated with azithromycin, solumedrol (to complete home prednisone taper), and continued O2 supplementation along with pulmicort and duonebs. Back on symbicort at home. She is also using spiriva. She is using duonebs QID.   Has increased smoking to 2 ppd. She used nicotine patch while hospitalized. Requests refills - which she can get free through [facility] order.   Since home, staying dyspneic despite increased oxygen to 3L by [facility]. Wheezing and cough improving. No fevers/chills. Cough productive of clear mucous.   Feels current mood regimen is stable - desires to continue this.   Admit date: 05/15/2019 Discharge date: 05/20/2019 TCM hosp f/u phone call completed on: 05/21/2019  Admitted From: Home Discharge disposition: Home   Code Status: DNR  Diet Recommendation: Regular diet  Recommendations for Outpatient Follow-Up:   1. Follow-up with PCP as an outpatient. 2. Follow-up with pulmonologist.  Discharge Diagnosis:   Active Problems:   Chronic constipation   Tobacco abuse   HLD (hyperlipidemia)   COPD exacerbation (HCC)   COPD with acute exacerbation (HCC)      Relevant past medical, surgical, family and social history reviewed and updated as indicated. Interim medical history since our last visit reviewed. Allergies and medications reviewed and updated. Outpatient Medications Prior to Visit  Medication Sig Dispense Refill  . aspirin EC 81 MG tablet Take 81 mg by mouth daily.    . fluticasone (FLONASE) 50 MCG/ACT nasal spray Place 2 sprays into both nostrils daily. 48 g 3  . gabapentin (NEURONTIN) 100 MG capsule Take 1 capsule (100 mg total) by mouth 2 (two) times daily. 180 capsule 3  . hydrOXYzine (ATARAX/VISTARIL) 25 MG tablet TAKE 1 TABLET (25 MG TOTAL) BY MOUTH 2 (TWO) TIMES DAILY AS NEEDED FOR ANXIETY. 180 tablet 0  . ipratropium-albuterol (DUONEB) 0.5-2.5 (3) MG/3ML SOLN INHALE 3 MLS INTO THE LUNGS EVERY 6 (SIX) HOURS AS NEEDED. 360 mL 3  . LINZESS 290 MCG CAPS capsule Take 1 capsule (290 mcg total) by mouth daily as needed (constipation). 30 capsule 3  . Multiple Vitamins-Minerals (MULTIVITAMIN ADULT) TABS Take 1 tablet by mouth daily.    . naproxen (NAPROSYN) 375 MG tablet Take 1 tablet (375 mg total) by mouth 2 (two) times daily as needed for moderate pain. 40 tablet 1  . omeprazole (PRILOSEC) 40 MG capsule TAKE 1 CAPSULE BY MOUTH DAILY. FOR THREE WEEKS THEN AS NEEDED. TAKE 30 MIN PRIOR TO LARGE MEAL 90 capsule 1  . OXYGEN Inhale 3 L/min into the lungs continuous.    Marland Kitchen PROAIR HFA 108 (90 Base) MCG/ACT inhaler TAKE 2 PUFFS BY  MOUTH EVERY 6 HOURS AS NEEDED FOR WHEEZE OR SHORTNESS OF BREATH 54 g 1  . SYMBICORT 160-4.5 MCG/ACT inhaler Inhale 2 puffs into the lungs 2 (two) times daily. 3 Inhaler 3  . tiotropium (SPIRIVA) 18 MCG inhalation capsule Place 1 capsule (18 mcg total) into inhaler and inhale daily. 90 capsule 4  . tiZANidine (ZANAFLEX) 2 MG tablet TAKE 1 TABLET (2 MG TOTAL) BY MOUTH 2 (TWO) TIMES DAILY AS NEEDED FOR MUSCLE SPASMS (NECK PAIN). 60 tablet 1  . traZODone (DESYREL) 50 MG tablet Take 1 tablet (50 mg total) by mouth at bedtime. 90  tablet 3  . vitamin B-12 (CYANOCOBALAMIN) 1000 MCG tablet Take 1,000 mcg by mouth daily.    . vitamin E 400 UNIT capsule Take 400 Units by mouth daily.     Marland Kitchen buPROPion (WELLBUTRIN SR) 100 MG 12 hr tablet TAKE 1 TABLET BY MOUTH EVERY DAY 90 tablet 0  . nicotine (NICODERM CQ) 14 mg/24hr patch Place 1 patch (14 mg total) onto the skin daily. 28 patch 3  . predniSONE (DELTASONE) 10 MG tablet Take 4 tablets (40 mg) daily for 2 days, then, Take 3 tablets (30 mg) daily for 2 days, then, Take 2 tablets (20 mg) daily for 2 days, then, Take 1 tablets (10 mg) daily for 1 days, then stop 19 tablet 0  . sertraline (ZOLOFT) 100 MG tablet TAKE 1.5 TABLETS BY MOUTH DAILY (Patient taking differently: Take 150 mg by mouth daily. ) 45 tablet 0   No facility-administered medications prior to visit.      Per HPI unless specifically indicated in ROS section below Review of Systems Objective:    BP 116/68 (BP Location: Right Arm, Patient Position: Sitting, Cuff Size: Normal)   Pulse (!) 108   Temp 97.9 F (36.6 C) (Temporal)   Ht 5\' 4"  (1.626 m)   Wt 115 lb 7 oz (52.4 kg)   SpO2 98% Comment: 3 L  BMI 19.81 kg/m   Wt Readings from Last 3 Encounters:  05/26/19 115 lb 7 oz (52.4 kg)  05/16/19 111 lb 5.3 oz (50.5 kg)  02/25/19 111 lb 4 oz (50.5 kg)    Physical Exam Vitals signs and nursing note reviewed.  Constitutional:      General: She is not in acute distress.    Appearance: Normal appearance. She is ill-appearing (chronic).  Eyes:     Extraocular Movements: Extraocular movements intact.     Pupils: Pupils are equal, round, and reactive to light.  Cardiovascular:     Rate and Rhythm: Normal rate and regular rhythm.     Pulses: Normal pulses.     Heart sounds: Normal heart sounds. No murmur.  Pulmonary:     Effort: Pulmonary effort is normal. No respiratory distress.     Breath sounds: Wheezing (faint expiratory) and rhonchi (faint bilateral) present. No rales.     Comments:  Coarse breath  sounds throughout No significant exertional dyspnea appreciated today Psychiatric:        Mood and Affect: Mood normal.        Behavior: Behavior normal.       Assessment & Plan:  This visit occurred during the SARS-CoV-2 public health emergency.  Safety protocols were in place, including screening questions prior to the visit, additional usage of staff PPE, and extensive cleaning of exam room while observing appropriate contact time as indicated for disinfecting solutions.   Problem List Items Addressed This Visit    Tobacco abuse    Increased  smoking to 2 ppd after roommate's recent death. Support provided. Reviewed importance of full smoking cessation in setting of severe O2 dependent COPD to maintain lung function as much as able - she has requested 90d supply of nicoderm CQ 14mg /day patches and she will renew smoking cessation efforts.       Moderate recurrent major depression (HCC)    Stable period on current regimen of sertraline 150mg  daily, wellbutrin 100mg  daily - refilled today.       Relevant Medications   sertraline (ZOLOFT) 100 MG tablet   buPROPion (WELLBUTRIN SR) 100 MG 12 hr tablet   COPD exacerbation ([place]) - Primary    Recent hospitalization reviewed with patient. She has completed antibiotic and prednisone taper. Ongoing dyspnea but wheezing, cough improved. No need for further prednisone or antibiotic.       Relevant Medications   nicotine (NICODERM CQ) 14 mg/24hr patch   albuterol (PROVENTIL) (2.5 MG/3ML) 0.083% nebulizer solution   COPD (chronic obstructive pulmonary disease) (HCC)    Severe oxygen dependent COPD. She has continued spiriva, symbicort, duonebs QID at this time. Discussed redundancy of duonebs with spiriva - rec switch to albuterol nebulizer only - sent to pharmacy.  Reviewed importance of smoking cessation to maintain adequate lung function.  Last saw pulm 04/2018, overdue for f/u - encouraged she call to schedule appt with new provider (prior  pulmonologist has moved).       Relevant Medications   nicotine (NICODERM CQ) 14 mg/24hr patch   albuterol (PROVENTIL) (2.5 MG/3ML) 0.083% nebulizer solution       Meds ordered this encounter  Medications  . nicotine (NICODERM CQ) 14 mg/24hr patch    Sig: Place 1 patch (14 mg total) onto the skin daily.    Dispense:  84 patch    Refill:  1  . albuterol (PROVENTIL) (2.5 MG/3ML) 0.083% nebulizer solution    Sig: Take 3 mLs (2.5 mg total) by nebulization every 6 (six) hours as needed for wheezing or shortness of breath.    Dispense:  150 mL    Refill:  1  . sertraline (ZOLOFT) 100 MG tablet    Sig: Take 1.5 tablets (150 mg total) by mouth daily.    Dispense:  135 tablet    Refill:  1  . buPROPion (WELLBUTRIN SR) 100 MG 12 hr tablet    Sig: Take 1 tablet (100 mg total) by mouth daily.    Dispense:  90 tablet    Refill:  1   No orders of the defined types were placed in this encounter.  Patient Instructions  Call lung doctor office to schedule follow up.  Continue symbicort and spiriva.  Back off duonebs, just use albuterol neb - sent to pharmacy.  Spiriva has same medicine as one of the ingredients in duoneb.  Work on quitting smoking! This is the best thing you can do for your breathing.    Follow up plan: No follow-ups on file.  Ria Bush, MD

## 2019-05-27 NOTE — Telephone Encounter (Addendum)
Spoke with Christine Serrano at [facility]  Asked about Product Benefits program to get Nicoderm patch for pt.  States no form or authorization is needed.  The pt would just need to call them and order.   Relayed above info to pt.  Verbalizes understanding.  She will contact [place].

## 2019-05-28 ENCOUNTER — Ambulatory Visit: Payer: Medicare Other

## 2019-05-31 ENCOUNTER — Ambulatory Visit: Payer: Medicare Other

## 2019-06-05 ENCOUNTER — Other Ambulatory Visit: Payer: Self-pay | Admitting: Family Medicine

## 2019-06-05 NOTE — Telephone Encounter (Signed)
Last filled 04-11-19 #60 Last OV 05-26-19 No Future OV [facility]

## 2019-06-13 ENCOUNTER — Other Ambulatory Visit: Payer: Self-pay | Admitting: Family Medicine

## 2019-06-15 NOTE — Telephone Encounter (Signed)
Per our records patient is no longer on this cream. Declined request and sent note to the pharmacy

## 2019-06-25 ENCOUNTER — Other Ambulatory Visit: Payer: Self-pay | Admitting: Family Medicine

## 2019-06-25 MED ORDER — SERTRALINE HCL 100 MG PO TABS: 150.0000 mg | ORAL_TABLET | Freq: Every day | ORAL | 1 refills | Status: DC

## 2019-06-25 NOTE — Telephone Encounter (Signed)
Noted  

## 2019-06-25 NOTE — Telephone Encounter (Signed)
Spoke with patient today. Patient states she took her last Sertraline tablet today and needs refill. I asked patient if she received refill from [facility] that was authorized on 05/26/2019 but patient states this pharmacy and insurance did not start till 06/18/2019 and she has not received her medication. I resent RX to mail order pharmacy and sent 30 day supply to local pharmacy for patient to have while waiting. From now on patient states all of her medications would need to go to [facility] mail order.

## 2019-06-29 ENCOUNTER — Telehealth: Payer: Self-pay

## 2019-06-29 DIAGNOSIS — E673 Hypervitaminosis D: Secondary | ICD-10-CM

## 2019-06-29 NOTE — Telephone Encounter (Signed)
Pt due for Prolia injection 07-25-19.  Pt had abnormal calcium level, 8.8 on 05-19-2019.  Please advise.

## 2019-06-30 MED ORDER — CALCIUM CARBONATE 600 MG PO TABS: 600.0000 mg | ORAL_TABLET | Freq: Every day | ORAL | Status: DC

## 2019-06-30 NOTE — Telephone Encounter (Addendum)
Recommend starting calcium supplement 600mg  daily and rechecking levels. plz schedule lab visit. Will also check vit D levels as they were too high (should be off vit D replacement at this time).

## 2019-06-30 NOTE — Addendum Note (Signed)
Addended by: Ria Bush on: 06/30/2019 10:16 AM   Modules accepted: Orders

## 2019-07-01 MED ORDER — CALCIUM ASCORBATE 500 MG PO TABS: 1.0000 | ORAL_TABLET | Freq: Every day | ORAL | 0 refills | Status: DC

## 2019-07-01 NOTE — Telephone Encounter (Signed)
Ok to take 500mg . Check 2 wks after starting calcium.

## 2019-07-01 NOTE — Telephone Encounter (Signed)
Pt is off her Vit D supp.  She has not started calcium yet.  Pt just received mail order Calcium supp. 500mg  tabs.  Should she purchase 600mg ?  How long after she begins calcium should she come back for her calcium and Vit D labs?

## 2019-07-01 NOTE — Addendum Note (Signed)
Addended by: Ria Bush on: 07/01/2019 08:56 PM   Modules accepted: Orders

## 2019-07-02 NOTE — Telephone Encounter (Signed)
Pt advised ok to do the 500mg  calcium and labs 2 weeks after starting calcium.

## 2019-07-12 ENCOUNTER — Telehealth: Payer: Self-pay

## 2019-07-12 MED ORDER — OMEPRAZOLE 40 MG PO CPDR: DELAYED_RELEASE_CAPSULE | ORAL | 1 refills | Status: DC

## 2019-07-12 MED ORDER — HYDROXYZINE HCL 25 MG PO TABS: 25.0000 mg | ORAL_TABLET | Freq: Two times a day (BID) | ORAL | 0 refills | Status: DC | PRN

## 2019-07-12 MED ORDER — FLUTICASONE PROPIONATE 50 MCG/ACT NA SUSP: 2.0000 | Freq: Every day | NASAL | 3 refills | Status: DC

## 2019-07-12 MED ORDER — BUPROPION HCL ER (SR) 100 MG PO TB12: 100.0000 mg | ORAL_TABLET | Freq: Every day | ORAL | 1 refills | Status: DC

## 2019-07-12 MED ORDER — TIOTROPIUM BROMIDE MONOHYDRATE 18 MCG IN CAPS: 18.0000 ug | ORAL_CAPSULE | Freq: Every day | RESPIRATORY_TRACT | 2 refills | Status: DC

## 2019-07-12 MED ORDER — TRAZODONE HCL 50 MG PO TABS: 50.0000 mg | ORAL_TABLET | Freq: Every day | ORAL | 2 refills | Status: DC

## 2019-07-12 NOTE — Telephone Encounter (Signed)
E-scribed refills to [facility].  Lvm for pt to call back.  Need to notify her refills were sent.

## 2019-07-12 NOTE — Telephone Encounter (Signed)
Pt left v/m that Dr Darnell Level was expecting cb and pt is requesting all meds to be refilled to [facility] mail order pharmacy. Pt request cb when done.

## 2019-07-13 MED ORDER — IPRATROPIUM-ALBUTEROL 0.5-2.5 (3) MG/3ML IN SOLN: 3.0000 mL | Freq: Four times a day (QID) | RESPIRATORY_TRACT | 3 refills | Status: DC | PRN

## 2019-07-13 MED ORDER — LINZESS 290 MCG PO CAPS: 290.0000 ug | ORAL_CAPSULE | Freq: Every day | ORAL | 3 refills | Status: DC | PRN

## 2019-07-13 MED ORDER — PROAIR HFA 108 (90 BASE) MCG/ACT IN AERS: INHALATION_SPRAY | RESPIRATORY_TRACT | 1 refills | Status: DC

## 2019-07-13 NOTE — Telephone Encounter (Signed)
Noted  

## 2019-07-13 NOTE — Addendum Note (Signed)
Addended by: Pilar Grammes on: 07/13/2019 10:10 AM   Modules accepted: Orders

## 2019-07-13 NOTE — Telephone Encounter (Addendum)
Spoke to pt. She had a few more rxs that needed to go to [facility]. I sent those for her.

## 2019-07-16 ENCOUNTER — Other Ambulatory Visit (INDEPENDENT_AMBULATORY_CARE_PROVIDER_SITE_OTHER): Payer: Medicare HMO

## 2019-07-16 DIAGNOSIS — E673 Hypervitaminosis D: Secondary | ICD-10-CM | POA: Diagnosis not present

## 2019-07-16 LAB — VITAMIN D 25 HYDROXY (VIT D DEFICIENCY, FRACTURES): VITD: 68.69 ng/mL (ref 30.00–100.00)

## 2019-07-16 LAB — CALCIUM: Calcium: 9.9 mg/dL (ref 8.4–10.5)

## 2019-07-19 ENCOUNTER — Other Ambulatory Visit: Payer: Self-pay | Admitting: Family Medicine

## 2019-07-19 ENCOUNTER — Other Ambulatory Visit: Payer: Self-pay

## 2019-07-19 DIAGNOSIS — F331 Major depressive disorder, recurrent, moderate: Secondary | ICD-10-CM

## 2019-07-19 DIAGNOSIS — J441 Chronic obstructive pulmonary disease with (acute) exacerbation: Secondary | ICD-10-CM

## 2019-07-19 DIAGNOSIS — F419 Anxiety disorder, unspecified: Secondary | ICD-10-CM

## 2019-07-19 MED ORDER — SYMBICORT 160-4.5 MCG/ACT IN AERO: 2.0000 | INHALATION_SPRAY | Freq: Two times a day (BID) | RESPIRATORY_TRACT | 0 refills | Status: DC

## 2019-07-19 MED ORDER — GABAPENTIN 100 MG PO CAPS: 100.0000 mg | ORAL_CAPSULE | Freq: Two times a day (BID) | ORAL | 3 refills | Status: DC

## 2019-07-19 MED ORDER — SERTRALINE HCL 100 MG PO TABS: 150.0000 mg | ORAL_TABLET | Freq: Every day | ORAL | 0 refills | Status: DC

## 2019-07-19 MED ORDER — SYMBICORT 160-4.5 MCG/ACT IN AERO: 2.0000 | INHALATION_SPRAY | Freq: Two times a day (BID) | RESPIRATORY_TRACT | 3 refills | Status: DC

## 2019-07-19 MED ORDER — SERTRALINE HCL 100 MG PO TABS: 150.0000 mg | ORAL_TABLET | Freq: Every day | ORAL | 3 refills | Status: DC

## 2019-07-20 ENCOUNTER — Telehealth: Payer: Self-pay

## 2019-07-20 DIAGNOSIS — J441 Chronic obstructive pulmonary disease with (acute) exacerbation: Secondary | ICD-10-CM

## 2019-07-20 MED ORDER — SYMBICORT 160-4.5 MCG/ACT IN AERO: 2.0000 | INHALATION_SPRAY | Freq: Two times a day (BID) | RESPIRATORY_TRACT | 0 refills | Status: DC

## 2019-07-20 NOTE — Telephone Encounter (Signed)
I spoke with pt and she was told will take 7-10 days to get symbicort from mail order pharmacy. Pt request one inhaler to [facility] since pt is out of med until can get med from mail order pharmacy. Advised pt will send 1 inhaler of symbicort to [facility] electronically now. Pt voiced understanding and appreciatve.

## 2019-07-21 ENCOUNTER — Other Ambulatory Visit: Payer: Self-pay | Admitting: Family Medicine

## 2019-07-21 NOTE — Telephone Encounter (Signed)
refilled on 07/13/19 to [place] already. Denied requests from local pharmacy

## 2019-07-22 ENCOUNTER — Telehealth: Payer: Self-pay

## 2019-07-22 NOTE — Telephone Encounter (Signed)
LMOM.  When pt calls back need to discuss Prolia benefits.  Pt would owe approx. $235.

## 2019-07-22 NOTE — Telephone Encounter (Signed)
Pt advised Prolia benefits and scheduled

## 2019-07-23 ENCOUNTER — Telehealth: Payer: Self-pay

## 2019-07-23 NOTE — Telephone Encounter (Signed)
Received fax from [place] [facility] stating patient requests alternative to Proair inhaler due to coverage issue with insurance. Possible alternative per pharmacy is Ventolin HFA. Is this ok?

## 2019-07-26 MED ORDER — ALBUTEROL SULFATE HFA 108 (90 BASE) MCG/ACT IN AERS: 2.0000 | INHALATION_SPRAY | Freq: Four times a day (QID) | RESPIRATORY_TRACT | 1 refills | Status: DC | PRN

## 2019-07-26 NOTE — Telephone Encounter (Signed)
Sent in ventolin

## 2019-07-29 ENCOUNTER — Ambulatory Visit (INDEPENDENT_AMBULATORY_CARE_PROVIDER_SITE_OTHER): Payer: Medicare HMO | Admitting: Family Medicine

## 2019-07-29 ENCOUNTER — Other Ambulatory Visit: Payer: Self-pay

## 2019-07-29 DIAGNOSIS — M810 Age-related osteoporosis without current pathological fracture: Secondary | ICD-10-CM | POA: Diagnosis not present

## 2019-07-29 MED ORDER — DENOSUMAB 60 MG/ML ~~LOC~~ SOSY
60.0000 mg | PREFILLED_SYRINGE | Freq: Once | SUBCUTANEOUS | Status: AC
  Administered 2019-07-29: 60 mg via SUBCUTANEOUS

## 2019-07-29 NOTE — Progress Notes (Signed)
Per orders of Dr. Damita Dunnings in Dr Malachi Pro absence , injection of Prolia 60mg  given SQ by Deniel Mcquiston M. Patient tolerated injection well.

## 2019-08-31 ENCOUNTER — Telehealth: Payer: Self-pay | Admitting: Family Medicine

## 2019-08-31 ENCOUNTER — Other Ambulatory Visit: Payer: Self-pay | Admitting: Family Medicine

## 2019-08-31 DIAGNOSIS — F331 Major depressive disorder, recurrent, moderate: Secondary | ICD-10-CM

## 2019-08-31 DIAGNOSIS — F419 Anxiety disorder, unspecified: Secondary | ICD-10-CM

## 2019-08-31 DIAGNOSIS — M79676 Pain in unspecified toe(s): Secondary | ICD-10-CM

## 2019-08-31 NOTE — Telephone Encounter (Signed)
Pt called wanting to know if dr g does anything for ingrown toe nails.   Pt has ingrown toenail big toe on left foot  Please advise

## 2019-08-31 NOTE — Telephone Encounter (Signed)
I don't do toenail removals. Would offer podiatry referral - referral placed.

## 2019-08-31 NOTE — Telephone Encounter (Signed)
Please advise 

## 2019-09-01 NOTE — Telephone Encounter (Signed)
Lvm asking pt to call back.  Need to relay Dr. G's message.  

## 2019-09-01 NOTE — Telephone Encounter (Signed)
Pt returning call.  I relayed Dr. Synthia Innocent message.  Pt verbalizes understanding and expresses her thanks.

## 2019-09-01 NOTE — Telephone Encounter (Signed)
Naproxen Last filled:  04/28/19, #40 Last OV:  012/9/20, COPD exacerbation; 02/25/19, AWV Next OV:  none

## 2019-09-02 ENCOUNTER — Telehealth: Payer: Self-pay | Admitting: Family Medicine

## 2019-09-02 DIAGNOSIS — J439 Emphysema, unspecified: Secondary | ICD-10-CM

## 2019-09-02 NOTE — Telephone Encounter (Signed)
Pt called stating her nebulizer quit working and needs another rx sent to [facility] used to be advance

## 2019-09-03 NOTE — Addendum Note (Signed)
Addended by: Ria Bush on: 09/03/2019 08:37 AM   Modules accepted: Orders

## 2019-09-03 NOTE — Telephone Encounter (Signed)
This is being handled by [facility] See [employer] in your box

## 2019-09-03 NOTE — Telephone Encounter (Signed)
I have sent a Community message to [place] regarding this order. They are able to view in Epic and will communicate via Epic about the order. I have copied you both in that message so it can be followed.

## 2019-09-03 NOTE — Telephone Encounter (Signed)
Order placed. plz print and send or notify advance.

## 2019-09-06 DIAGNOSIS — J449 Chronic obstructive pulmonary disease, unspecified: Secondary | ICD-10-CM | POA: Diagnosis not present

## 2019-09-06 NOTE — Telephone Encounter (Signed)
Noted  

## 2019-09-10 ENCOUNTER — Observation Stay (HOSPITAL_COMMUNITY)
Admission: EM | Admit: 2019-09-10 | Discharge: 2019-09-12 | Disposition: A | Discharge status: Home / Self Care | Payer: Medicare HMO | Attending: Internal Medicine | Admitting: Internal Medicine

## 2019-09-10 ENCOUNTER — Ambulatory Visit: Payer: Medicare HMO | Admitting: Podiatry

## 2019-09-10 ENCOUNTER — Other Ambulatory Visit: Payer: Self-pay

## 2019-09-10 ENCOUNTER — Encounter (HOSPITAL_COMMUNITY): Payer: Self-pay

## 2019-09-10 DIAGNOSIS — F10929 Alcohol use, unspecified with intoxication, unspecified: Secondary | ICD-10-CM

## 2019-09-10 DIAGNOSIS — F10129 Alcohol abuse with intoxication, unspecified: Secondary | ICD-10-CM | POA: Insufficient documentation

## 2019-09-10 DIAGNOSIS — Z20822 Contact with and (suspected) exposure to covid-19: Secondary | ICD-10-CM | POA: Diagnosis not present

## 2019-09-10 DIAGNOSIS — Y904 Blood alcohol level of 80-99 mg/100 ml: Secondary | ICD-10-CM | POA: Insufficient documentation

## 2019-09-10 DIAGNOSIS — Z8541 Personal history of malignant neoplasm of cervix uteri: Secondary | ICD-10-CM | POA: Diagnosis not present

## 2019-09-10 DIAGNOSIS — E876 Hypokalemia: Secondary | ICD-10-CM

## 2019-09-10 DIAGNOSIS — F17210 Nicotine dependence, cigarettes, uncomplicated: Secondary | ICD-10-CM | POA: Insufficient documentation

## 2019-09-10 DIAGNOSIS — R42 Dizziness and giddiness: Principal | ICD-10-CM | POA: Insufficient documentation

## 2019-09-10 DIAGNOSIS — I700 Atherosclerosis of aorta: Secondary | ICD-10-CM | POA: Insufficient documentation

## 2019-09-10 DIAGNOSIS — Z9981 Dependence on supplemental oxygen: Secondary | ICD-10-CM | POA: Diagnosis not present

## 2019-09-10 DIAGNOSIS — J449 Chronic obstructive pulmonary disease, unspecified: Secondary | ICD-10-CM | POA: Insufficient documentation

## 2019-09-10 DIAGNOSIS — J9611 Chronic respiratory failure with hypoxia: Secondary | ICD-10-CM | POA: Diagnosis not present

## 2019-09-10 DIAGNOSIS — Z791 Long term (current) use of non-steroidal anti-inflammatories (NSAID): Secondary | ICD-10-CM | POA: Diagnosis not present

## 2019-09-10 DIAGNOSIS — L600 Ingrowing nail: Secondary | ICD-10-CM

## 2019-09-10 DIAGNOSIS — Z7951 Long term (current) use of inhaled steroids: Secondary | ICD-10-CM | POA: Diagnosis not present

## 2019-09-10 DIAGNOSIS — D638 Anemia in other chronic diseases classified elsewhere: Secondary | ICD-10-CM | POA: Insufficient documentation

## 2019-09-10 DIAGNOSIS — R5381 Other malaise: Secondary | ICD-10-CM | POA: Diagnosis not present

## 2019-09-10 DIAGNOSIS — Z7982 Long term (current) use of aspirin: Secondary | ICD-10-CM | POA: Diagnosis not present

## 2019-09-10 DIAGNOSIS — Z888 Allergy status to other drugs, medicaments and biological substances status: Secondary | ICD-10-CM | POA: Insufficient documentation

## 2019-09-10 DIAGNOSIS — D649 Anemia, unspecified: Secondary | ICD-10-CM

## 2019-09-10 DIAGNOSIS — Z79899 Other long term (current) drug therapy: Secondary | ICD-10-CM | POA: Diagnosis not present

## 2019-09-10 DIAGNOSIS — F329 Major depressive disorder, single episode, unspecified: Secondary | ICD-10-CM | POA: Diagnosis not present

## 2019-09-10 DIAGNOSIS — D509 Iron deficiency anemia, unspecified: Secondary | ICD-10-CM

## 2019-09-10 LAB — CBC WITH DIFFERENTIAL/PLATELET
Abs Immature Granulocytes: 0.01 10*3/uL (ref 0.00–0.07)
Basophils Absolute: 0 10*3/uL (ref 0.0–0.1)
Basophils Relative: 1 %
Eosinophils Absolute: 0.2 10*3/uL (ref 0.0–0.5)
Eosinophils Relative: 4 %
HCT: 27.6 % — ABNORMAL LOW (ref 36.0–46.0)
Hemoglobin: 9 g/dL — ABNORMAL LOW (ref 12.0–15.0)
Immature Granulocytes: 0 %
Lymphocytes Relative: 43 %
Lymphs Abs: 2.8 10*3/uL (ref 0.7–4.0)
MCH: 29 pg (ref 26.0–34.0)
MCHC: 32.6 g/dL (ref 30.0–36.0)
MCV: 89 fL (ref 80.0–100.0)
Monocytes Absolute: 0.4 10*3/uL (ref 0.1–1.0)
Monocytes Relative: 6 %
Neutro Abs: 3.1 10*3/uL (ref 1.7–7.7)
Neutrophils Relative %: 46 %
Platelets: 160 10*3/uL (ref 150–400)
RBC: 3.1 MIL/uL — ABNORMAL LOW (ref 3.87–5.11)
RDW: 14 % (ref 11.5–15.5)
WBC: 6.5 10*3/uL (ref 4.0–10.5)
nRBC: 0 % (ref 0.0–0.2)

## 2019-09-10 MED ORDER — GENTAMICIN SULFATE 0.1 % EX CREA: 1.0000 "application " | TOPICAL_CREAM | Freq: Two times a day (BID) | CUTANEOUS | 1 refills | Status: AC

## 2019-09-10 NOTE — ED Triage Notes (Signed)
Per EMS, patient c/o onset of dizziness about 45 minutes ago when she was out fishing with friends. Patient states she has hx of COPD and when it is exacerbated she feels this way. Patient is normally on 2L of oxygen at home.   EMS: 18g left ac 500 mL ns

## 2019-09-10 NOTE — Patient Instructions (Signed)

## 2019-09-11 ENCOUNTER — Emergency Department (HOSPITAL_COMMUNITY): Payer: Medicare HMO

## 2019-09-11 ENCOUNTER — Observation Stay (HOSPITAL_COMMUNITY): Payer: Medicare HMO

## 2019-09-11 DIAGNOSIS — R42 Dizziness and giddiness: Secondary | ICD-10-CM

## 2019-09-11 DIAGNOSIS — F10929 Alcohol use, unspecified with intoxication, unspecified: Secondary | ICD-10-CM

## 2019-09-11 DIAGNOSIS — E876 Hypokalemia: Secondary | ICD-10-CM

## 2019-09-11 DIAGNOSIS — D649 Anemia, unspecified: Secondary | ICD-10-CM

## 2019-09-11 DIAGNOSIS — D509 Iron deficiency anemia, unspecified: Secondary | ICD-10-CM

## 2019-09-11 LAB — BASIC METABOLIC PANEL
Anion gap: 4 — ABNORMAL LOW (ref 5–15)
BUN: 6 mg/dL (ref 6–20)
CO2: 26 mmol/L (ref 22–32)
Calcium: 6.7 mg/dL — ABNORMAL LOW (ref 8.9–10.3)
Chloride: 106 mmol/L (ref 98–111)
Creatinine, Ser: 0.46 mg/dL (ref 0.44–1.00)
GFR calc Af Amer: >60 mL/min (ref >60)
GFR calc non Af Amer: >60 mL/min (ref >60)
Glucose, Bld: 85 mg/dL (ref 70–99)
Potassium: 3.1 mmol/L — ABNORMAL LOW (ref 3.5–5.1)
Sodium: 136 mmol/L (ref 135–145)

## 2019-09-11 LAB — IRON AND TIBC
Iron: 120 ug/dL (ref 28–170)
Saturation Ratios: 33 % — ABNORMAL HIGH (ref 10.4–31.8)
TIBC: 361 ug/dL (ref 250–450)
UIBC: 241 ug/dL

## 2019-09-11 LAB — URINALYSIS, ROUTINE W REFLEX MICROSCOPIC
Bilirubin Urine: NEGATIVE
Glucose, UA: NEGATIVE mg/dL
Ketones, ur: NEGATIVE mg/dL
Leukocytes,Ua: NEGATIVE
Nitrite: NEGATIVE
Protein, ur: NEGATIVE mg/dL
Specific Gravity, Urine: 1.002 — ABNORMAL LOW (ref 1.005–1.030)
pH: 6 (ref 5.0–8.0)

## 2019-09-11 LAB — FOLATE: Folate: 2.7 ng/mL — ABNORMAL LOW (ref >5.9)

## 2019-09-11 LAB — FERRITIN: Ferritin: 28 ng/mL (ref 11–307)

## 2019-09-11 LAB — RETICULOCYTES
Immature Retic Fract: 8.2 % (ref 2.3–15.9)
RBC.: 4.3 MIL/uL (ref 3.87–5.11)
Retic Count, Absolute: 37.4 10*3/uL (ref 19.0–186.0)
Retic Ct Pct: 0.9 % (ref 0.4–3.1)

## 2019-09-11 LAB — MAGNESIUM: Magnesium: 1.7 mg/dL (ref 1.7–2.4)

## 2019-09-11 LAB — PHOSPHORUS: Phosphorus: 3.2 mg/dL (ref 2.5–4.6)

## 2019-09-11 LAB — VITAMIN D 25 HYDROXY (VIT D DEFICIENCY, FRACTURES): Vit D, 25-Hydroxy: 44.48 ng/mL (ref 30–100)

## 2019-09-11 LAB — VITAMIN B12: Vitamin B-12: 379 pg/mL (ref 180–914)

## 2019-09-11 LAB — ETHANOL: Alcohol, Ethyl (B): 84 mg/dL — ABNORMAL HIGH (ref <10)

## 2019-09-11 LAB — ALBUMIN: Albumin: 3 g/dL — ABNORMAL LOW (ref 3.5–5.0)

## 2019-09-11 LAB — SARS CORONAVIRUS 2 (TAT 6-24 HRS): SARS Coronavirus 2: NEGATIVE

## 2019-09-11 MED ORDER — THIAMINE HCL 100 MG/ML IJ SOLN: 100.0000 mg | Freq: Every day | INTRAMUSCULAR | Status: DC

## 2019-09-11 MED ORDER — TIOTROPIUM BROMIDE MONOHYDRATE 18 MCG IN CAPS: 18.0000 ug | ORAL_CAPSULE | Freq: Every day | RESPIRATORY_TRACT | Status: DC

## 2019-09-11 MED ORDER — POTASSIUM CHLORIDE CRYS ER 20 MEQ PO TBCR
40.0000 meq | EXTENDED_RELEASE_TABLET | Freq: Once | ORAL | Status: AC
  Administered 2019-09-11: 40 meq via ORAL
  Filled 2019-09-11: qty 2

## 2019-09-11 MED ORDER — ORAL CARE MOUTH RINSE
15.0000 mL | Freq: Two times a day (BID) | OROMUCOSAL | Status: DC
  Administered 2019-09-12: 15 mL via OROMUCOSAL

## 2019-09-11 MED ORDER — ENSURE ENLIVE PO LIQD: 237.0000 mL | Freq: Two times a day (BID) | ORAL | Status: DC

## 2019-09-11 MED ORDER — SODIUM CHLORIDE 0.9 % IV SOLN: 1.0000 g | Freq: Once | INTRAVENOUS | Status: DC

## 2019-09-11 MED ORDER — METOCLOPRAMIDE HCL 5 MG/ML IJ SOLN
10.0000 mg | Freq: Once | INTRAMUSCULAR | Status: AC
  Administered 2019-09-11: 10 mg via INTRAVENOUS
  Filled 2019-09-11: qty 2

## 2019-09-11 MED ORDER — LORAZEPAM 2 MG/ML IJ SOLN: 1.0000 mg | INTRAMUSCULAR | Status: DC | PRN

## 2019-09-11 MED ORDER — FOLIC ACID 1 MG PO TABS
1.0000 mg | ORAL_TABLET | Freq: Every day | ORAL | Status: DC
  Administered 2019-09-11 – 2019-09-12 (×2): 1 mg via ORAL
  Filled 2019-09-11 (×2): qty 1

## 2019-09-11 MED ORDER — SODIUM CHLORIDE 0.9 % IV BOLUS (SEPSIS)
1000.0000 mL | Freq: Once | INTRAVENOUS | Status: AC
  Administered 2019-09-11: 1000 mL via INTRAVENOUS

## 2019-09-11 MED ORDER — MOMETASONE FURO-FORMOTEROL FUM 200-5 MCG/ACT IN AERO
2.0000 | INHALATION_SPRAY | Freq: Two times a day (BID) | RESPIRATORY_TRACT | Status: DC
  Administered 2019-09-11 – 2019-09-12 (×2): 2 via RESPIRATORY_TRACT
  Filled 2019-09-11: qty 8.8

## 2019-09-11 MED ORDER — ADULT MULTIVITAMIN W/MINERALS CH
1.0000 | ORAL_TABLET | Freq: Every day | ORAL | Status: DC
  Administered 2019-09-11 – 2019-09-12 (×2): 1 via ORAL
  Filled 2019-09-11 (×2): qty 1

## 2019-09-11 MED ORDER — NICOTINE 14 MG/24HR TD PT24
14.0000 mg | MEDICATED_PATCH | Freq: Every day | TRANSDERMAL | Status: DC
  Administered 2019-09-11 – 2019-09-12 (×2): 14 mg via TRANSDERMAL
  Filled 2019-09-11 (×2): qty 1

## 2019-09-11 MED ORDER — LORAZEPAM 1 MG PO TABS: 1.0000 mg | ORAL_TABLET | ORAL | Status: DC | PRN

## 2019-09-11 MED ORDER — MECLIZINE HCL 25 MG PO TABS: 25.0000 mg | ORAL_TABLET | Freq: Three times a day (TID) | ORAL | Status: DC | PRN

## 2019-09-11 MED ORDER — DIPHENHYDRAMINE HCL 50 MG/ML IJ SOLN
25.0000 mg | Freq: Once | INTRAMUSCULAR | Status: AC
  Administered 2019-09-11: 25 mg via INTRAVENOUS
  Filled 2019-09-11: qty 1

## 2019-09-11 MED ORDER — BUPROPION HCL ER (SR) 100 MG PO TB12
100.0000 mg | ORAL_TABLET | Freq: Every day | ORAL | Status: DC
  Administered 2019-09-11 – 2019-09-12 (×2): 100 mg via ORAL
  Filled 2019-09-11 (×2): qty 1

## 2019-09-11 MED ORDER — CALCIUM GLUCONATE-NACL 1-0.675 GM/50ML-% IV SOLN
1.0000 g | Freq: Once | INTRAVENOUS | Status: AC
  Administered 2019-09-11: 1000 mg via INTRAVENOUS
  Filled 2019-09-11: qty 50

## 2019-09-11 MED ORDER — LINACLOTIDE 145 MCG PO CAPS
290.0000 ug | ORAL_CAPSULE | Freq: Every day | ORAL | Status: DC | PRN
  Filled 2019-09-11: qty 2

## 2019-09-11 MED ORDER — FLUTICASONE PROPIONATE 50 MCG/ACT NA SUSP
2.0000 | Freq: Every day | NASAL | Status: DC
  Administered 2019-09-11 – 2019-09-12 (×2): 2 via NASAL
  Filled 2019-09-11: qty 16

## 2019-09-11 MED ORDER — ACETAMINOPHEN 650 MG RE SUPP: 650.0000 mg | Freq: Four times a day (QID) | RECTAL | Status: DC | PRN

## 2019-09-11 MED ORDER — THIAMINE HCL 100 MG PO TABS
100.0000 mg | ORAL_TABLET | Freq: Every day | ORAL | Status: DC
  Administered 2019-09-11 – 2019-09-12 (×2): 100 mg via ORAL
  Filled 2019-09-11 (×2): qty 1

## 2019-09-11 MED ORDER — ACETAMINOPHEN 325 MG PO TABS: 650.0000 mg | ORAL_TABLET | Freq: Four times a day (QID) | ORAL | Status: DC | PRN

## 2019-09-11 MED ORDER — SERTRALINE HCL 50 MG PO TABS
150.0000 mg | ORAL_TABLET | Freq: Every day | ORAL | Status: DC
  Administered 2019-09-11 – 2019-09-12 (×2): 150 mg via ORAL
  Filled 2019-09-11 (×2): qty 1

## 2019-09-11 MED ORDER — IPRATROPIUM-ALBUTEROL 0.5-2.5 (3) MG/3ML IN SOLN
3.0000 mL | Freq: Four times a day (QID) | RESPIRATORY_TRACT | Status: DC | PRN
  Administered 2019-09-11: 15:00:00 3 mL via RESPIRATORY_TRACT
  Filled 2019-09-11: qty 3

## 2019-09-11 MED ORDER — IPRATROPIUM-ALBUTEROL 0.5-2.5 (3) MG/3ML IN SOLN
3.0000 mL | Freq: Two times a day (BID) | RESPIRATORY_TRACT | Status: DC
  Administered 2019-09-11 – 2019-09-12 (×2): 3 mL via RESPIRATORY_TRACT
  Filled 2019-09-11 (×2): qty 3

## 2019-09-11 MED ORDER — UMECLIDINIUM BROMIDE 62.5 MCG/INH IN AEPB
1.0000 | INHALATION_SPRAY | Freq: Every day | RESPIRATORY_TRACT | Status: DC
  Administered 2019-09-11 – 2019-09-12 (×2): 1 via RESPIRATORY_TRACT
  Filled 2019-09-11: qty 7

## 2019-09-11 NOTE — Progress Notes (Signed)
Patient ID: Christine Serrano, female   DOB: 10-Sep-1960, 59 y.o.   MRN: [id number] Patient was admitted early this morning for dizziness.  I have reviewed patient's medical records including this morning's H&P, current vitals, labs, medications myself.  Patient seen and examined at bedside and plan of care discussed with her.  MRI of the brain is pending.  Will need PT evaluation.  Repeat a.m. labs.

## 2019-09-11 NOTE — ED Notes (Signed)
Patient transported to CT 

## 2019-09-11 NOTE — ED Notes (Signed)
Patient made aware of need for urine sample. Patient states she is unable to pee at this time, but will try later.

## 2019-09-11 NOTE — Evaluation (Signed)
Physical Therapy Evaluation Patient Details Name: Christine Serrano MRN: [id number] DOB: 1961-01-01 Today's Date: 09/11/2019   History of Present Illness  Pt is 59 yo female with PMH including COPD, resp failure on 2 LPM o2, depression, cervical CA, hyperlipidemia, and tobacco abuse.  She presented to ED w/ c/o dizziness. Pt admitted with vertigo.  Clinical Impression   Pt admitted with above diagnosis.  At time of PT eval, pt had no dizziness or vertigo.  All vestibular testing with PT negative. She did have slight decrease in BP with standing but was asymptomatic.  Demonstrated safe gait and transfers.  Pt at baseline mobility.  No further PT indicated.     Follow Up Recommendations No PT follow up    Equipment Recommendations  None recommended by PT    Recommendations for Other Services       Precautions / Restrictions Precautions Precautions: None      Mobility  Bed Mobility Overal bed mobility: Independent                Transfers Overall transfer level: Needs assistance Equipment used: None Transfers: Sit to/from Stand Sit to Stand: Supervision         General transfer comment: supv for safety  Ambulation/Gait Ambulation/Gait assistance: Supervision Gait Distance (Feet): 120 Feet Assistive device: None Gait Pattern/deviations: Step-through pattern     General Gait Details: normal speed and gait; able to manage O2 tubing; no LOB  Stairs            Wheelchair Mobility    Modified Rankin (Stroke Patients Only)       Balance Overall balance assessment: Independent                                           Pertinent Vitals/Pain Pain Assessment: No/denies pain    Home Living Family/patient expects to be discharged to:: Private residence Living Arrangements: Other relatives Available Help at Discharge: Friend(s);Available PRN/intermittently Type of Home: House Home Access: Stairs to enter Entrance Stairs-Rails:  None Entrance Stairs-Number of Steps: 2 Home Layout: One level Home Equipment: None;Grab bars - toilet Additional Comments: Uses 2 LPM O2 at home    Prior Function Level of Independence: Needs assistance   Gait / Transfers Assistance Needed: Able to ambulate short community distances due to [facility]  ADL's / Homemaking Assistance Needed: Reports friend occasionally assist with showers due to [facility]; able to do light IADLs  Comments: No AD used     Hand Dominance        Extremity/Trunk Assessment   Upper Extremity Assessment Upper Extremity Assessment: Overall WFL for tasks assessed    Lower Extremity Assessment Lower Extremity Assessment: Overall WFL for tasks assessed    Cervical / Trunk Assessment Cervical / Trunk Assessment: Normal  Communication   Communication: No difficulties  Cognition Arousal/Alertness: Awake/alert Behavior During Therapy: WFL for tasks assessed/performed Overall Cognitive Status: Within Functional Limits for tasks assessed                                        General Comments   Orthostatic Bps: Supine 127/94 HR 97 Sit 116/96 HR 112 Stand 114/77 HR 111 Pt had no lightheadiness  Vestibular Testing:  History of dizziness:   Reports dizziness yesterday that felt like she was moving  when she was not.  Reports it occured when first stood up.  Did report BP was lower at home.  Denies prior episodes of dizziness. Reports that she had a small canned margarita mix from the grocery store throughout the whole day , but did not feel that was enough to contribute.  Did note it was warmer yesterday and she had been outside most of the day.  EOEM: Intact, no nystagmus, no symptoms Gaze Stabilization: Intact, no nystagmus, no symptoms Head Thrust: Intact, no nystagmus, no symptoms [facility]: Negative, no nystagmus, no symptoms  Pt educated on PT vestibular test results.  She does report that her blood pressures do run low.  Discussed  safety with orthostatic hypotension including getting up slow, sitting EOB prior to standing quickly, and then standing and weight shifting a few seconds before walking.      Exercises     Assessment/Plan    PT Assessment Patent does not need any further PT services  PT Problem List         PT Treatment Interventions      PT Goals (Current goals can be found in the Care Plan section)  Acute Rehab PT Goals Patient Stated Goal: return home PT Goal Formulation: All assessment and education complete, DC therapy Time For Goal Achievement: 09/11/19 Potential to Achieve Goals: Good    Frequency     Barriers to discharge        Co-evaluation               AM-PAC PT "6 Clicks" Mobility  Outcome Measure Help needed turning from your back to your side while in a flat bed without using bedrails?: None Help needed moving from lying on your back to sitting on the side of a flat bed without using bedrails?: None Help needed moving to and from a bed to a chair (including a wheelchair)?: None Help needed standing up from a chair using your arms (e.g., wheelchair or bedside chair)?: None Help needed to walk in hospital room?: None Help needed climbing 3-5 steps with a railing? : None 6 Click Score: 24    End of Session Equipment Utilized During Treatment: Gait belt Activity Tolerance: Patient tolerated treatment well Patient left: in bed;with call bell/phone within reach;with bed alarm set Nurse Communication: Mobility status      Time: 1550-1616 PT Time Calculation (min) (ACUTE ONLY): 26 min   Charges:   PT Evaluation $PT Eval Low Complexity: 1 Low          Maggie Font, PT Acute Rehab Services Pager [phone] [facility] [phone] [facility] 09/11/2019, 4:55 PM

## 2019-09-11 NOTE — H&P (Signed)
History and Physical    Christine Serrano [id number] DOB: Apr 13, 1961 DOA: 09/10/2019  PCP: Ria Bush, MD Patient coming from: Home  Chief Complaint: Dizziness  HPI: Christine Serrano is a 59 y.o. female with medical history significant of COPD, chronic respiratory failure on 2 L home oxygen, depression, endometriosis, cervical cancer, hyperlipidemia, tobacco use presenting to the ED with complaints of dizziness.  Patient states yesterday she was walking and all of a sudden felt dizzy.  This happened around 8 PM on 3/26.  She was having a headache at that time.  She is not sure how long the dizziness lasted, not feeling it currently.  Denies any difficulty with speech, facial droop, or focal weakness/numbness.  She normally consumes 1 alcoholic beverage every night and yesterday night had a margarita before this episode of dizziness.  States she was previously taking vitamin D which was stopped by her doctor as he was concerned that it was too much.  She has now recently started taking vitamin D again once daily and is not sure about the dose. Denies any recent illness, fevers, chills, cough, shortness of breath, chest pain, nausea, vomiting, abdominal pain, or diarrhea.  Denies hematemesis, hematochezia, or melena.  ED Course: Afebrile.  Hemodynamically stable and satting well on 2 L home oxygen.  Orthostatics negative.  Labs showing no leukocytosis.  Hemoglobin 9.0, was in the 11-12 range on labs done 3 months ago.  Potassium 3.1.  Corrected calcium 7.5.  EKG with borderline QT prolongation.  Blood ethanol level elevated at 84.  UA not suggestive of infection.  SARS-CoV-2 PCR test pending.  Head CT negative for acute intracranial abnormality.  Patient received Benadryl, Reglan, calcium gluconate 1 g, and 1 L normal saline bolus.  Review of Systems:  All systems reviewed and apart from history of presenting illness, are negative.  Past Medical History:  Diagnosis Date  . Cervical cancer  ([place])   . Chronic idiopathic constipation   . Community acquired pneumonia of right upper lobe of lung 12/10/2016  . COPD (chronic obstructive pulmonary disease) ([place])   . Depression   . Endometriosis   . HLD (hyperlipidemia)   . Smoker     Past Surgical History:  Procedure Laterality Date  . ABLATION ON ENDOMETRIOSIS    . APPENDECTOMY    . BREAST EXCISIONAL BIOPSY Left age 24   benign biopsy - chronic fatty deposit  . COLONOSCOPY WITH PROPOFOL N/A 08/04/2017   TAx3, angiodysplastic lesion, diverticulosis, rpt 5 yrs ([facility])  . EXPLORATORY LAPAROTOMY  1992   endometriosis  . FOOT SURGERY Right    needle imbeded  . LASER ABLATION CONDYLOMA CERVICAL / VULVAR    . NASAL SEPTUM SURGERY Bilateral   . OVARIAN CYST SURGERY Bilateral 1980   remote  . SALPINGOOPHORECTOMY Right 1983   ectopic pregnancy  . TONSILLECTOMY       reports that she has been smoking cigarettes. She has a 23.50 pack-year smoking history. She has never used smokeless tobacco. She reports current alcohol use. She reports that she does not use drugs.  Allergies  Allergen Reactions  . Imitrex [Sumatriptan] Other (See Comments)    Worsens migraine     Family History  Problem Relation Age of Onset  . Heart disease Mother   . Hypertension Mother   . Hypertension Father   . COPD Father   . Hearing loss Maternal Grandmother   . Hypertension Maternal Grandmother   . Hearing loss Maternal Grandfather   . Hypertension Maternal  Grandfather   . Colon cancer Paternal Grandmother     Prior to Admission medications   Medication Sig Start Date End Date Taking? Authorizing Provider  albuterol (VENTOLIN HFA) 108 (90 Base) MCG/ACT inhaler Inhale 2 puffs into the lungs every 6 (six) hours as needed for wheezing or shortness of breath. 07/26/19   Ria Bush, MD  aspirin EC 81 MG tablet Take 81 mg by mouth daily.    [provider]  buPROPion (WELLBUTRIN SR) 100 MG 12 hr tablet Take 1 tablet (100 mg  total) by mouth daily. 07/12/19   Ria Bush, MD  Calcium Ascorbate 500 MG TABS Take 1 tablet (500 mg total) by mouth daily. 07/01/19   Ria Bush, MD  fluticasone [facility]) 50 MCG/ACT nasal spray Place 2 sprays into both nostrils daily. 07/12/19   Ria Bush, MD  gabapentin (NEURONTIN) 100 MG capsule Take 1 capsule (100 mg total) by mouth 2 (two) times daily. 07/19/19   Ria Bush, MD  gentamicin cream (GARAMYCIN) 0.1 % Apply 1 application topically in the morning and at bedtime for 14 days. Apply topically twice a day on affected nail 09/10/19 09/24/19  Edrick Kins, DPM  hydrOXYzine (ATARAX/VISTARIL) 25 MG tablet Take 1 tablet (25 mg total) by mouth 2 (two) times daily as needed for anxiety. 07/12/19   Ria Bush, MD  ipratropium-albuterol (DUONEB) 0.5-2.5 (3) MG/3ML SOLN Inhale 3 mLs into the lungs every 6 (six) hours as needed. 07/13/19   Ria Bush, MD  LINZESS 290 MCG CAPS capsule Take 1 capsule (290 mcg total) by mouth daily as needed (constipation). 07/13/19   Ria Bush, MD  Multiple Vitamins-Minerals (MULTIVITAMIN ADULT) TABS Take 1 tablet by mouth daily.    [provider]  naproxen (NAPROSYN) 375 MG tablet TAKE 1 TABLET (375 MG TOTAL) BY MOUTH 2 (TWO) TIMES DAILY AS NEEDED FOR MODERATE PAIN. 09/01/19   Ria Bush, MD  nicotine (NICODERM CQ) 14 mg/24hr patch Place 1 patch (14 mg total) onto the skin daily. 05/26/19   Ria Bush, MD  omeprazole (PRILOSEC) 40 MG capsule TAKE 1 CAPSULE BY MOUTH DAILY. FOR THREE WEEKS THEN AS NEEDED. TAKE 30 MIN PRIOR TO LARGE MEAL 07/12/19   Ria Bush, MD  OXYGEN Inhale 3 L/min into the lungs continuous.    [provider]  sertraline (ZOLOFT) 100 MG tablet Take 1.5 tablets (150 mg total) by mouth daily. 07/19/19   Ria Bush, MD  SYMBICORT 160-4.5 MCG/ACT inhaler Inhale 2 puffs into the lungs 2 (two) times daily. 07/20/19   Ria Bush, MD  tiotropium (SPIRIVA) 18 MCG  inhalation capsule Place 1 capsule (18 mcg total) into inhaler and inhale daily. 07/12/19   Ria Bush, MD  tiZANidine (ZANAFLEX) 2 MG tablet TAKE 1 TABLET (2 MG TOTAL) BY MOUTH 2 (TWO) TIMES DAILY AS NEEDED FOR MUSCLE SPASMS (NECK PAIN). 06/05/19   Ria Bush, MD  traZODone (DESYREL) 50 MG tablet Take 1 tablet (50 mg total) by mouth at bedtime. 07/12/19   Ria Bush, MD  vitamin B-12 (CYANOCOBALAMIN) 1000 MCG tablet Take 1,000 mcg by mouth daily.    [provider]  vitamin E 400 UNIT capsule Take 400 Units by mouth daily.     [provider]    Physical Exam: Vitals:   09/11/19 0400 09/11/19 0430 09/11/19 0500 09/11/19 0530  BP: 127/63 121/69 (!) 117/57 125/65  Pulse: 90 85 83 84  Resp:  (!) 26 17 17   Temp:      TempSrc:  SpO2:  96% 97% 96%  Weight:      Height:        Physical Exam  Constitutional: She is oriented to person, place, and time. She appears well-developed and well-nourished. No distress.  HENT:  Head: Normocephalic.  Eyes: Right eye exhibits no discharge. Left eye exhibits no discharge.  No horizontal or vertical nystagmus  Cardiovascular: Normal rate, regular rhythm and intact distal pulses.  Pulmonary/Chest: Effort normal. No respiratory distress. She has no wheezes. She has no rales.  Abdominal: Soft. Bowel sounds are normal. She exhibits no distension. There is no abdominal tenderness. There is no guarding.  Musculoskeletal:        General: No edema.     Cervical back: Neck supple.  Neurological: She is alert and oriented to person, place, and time.  Skin: Skin is warm and dry. She is not diaphoretic.     Labs on Admission: I have personally reviewed following labs and imaging studies  CBC: Recent Labs  Lab 09/10/19 2329  WBC 6.5  NEUTROABS 3.1  HGB 9.0*  HCT 27.6*  MCV 89.0  PLT 0000000   Basic Metabolic Panel: Recent Labs  Lab 09/10/19 2329  NA 136  K 3.1*  CL 106  CO2 26  GLUCOSE 85  BUN 6    CREATININE 0.46  CALCIUM 6.7*   GFR: Estimated Creatinine Clearance: 60 mL/min (by C-G formula based on SCr of 0.46 mg/dL). Liver Function Tests: Recent Labs  Lab 09/10/19 2329  ALBUMIN 3.0*   No results for input(s): LIPASE, AMYLASE in the last 168 hours. No results for input(s): AMMONIA in the last 168 hours. Coagulation Profile: No results for input(s): INR, PROTIME in the last 168 hours. Cardiac Enzymes: No results for input(s): CKTOTAL, CKMB, CKMBINDEX, TROPONINI in the last 168 hours. BNP (last 3 results) No results for input(s): PROBNP in the last 8760 hours. HbA1C: No results for input(s): HGBA1C in the last 72 hours. CBG: No results for input(s): GLUCAP in the last 168 hours. Lipid Profile: No results for input(s): CHOL, HDL, LDLCALC, TRIG, CHOLHDL, LDLDIRECT in the last 72 hours. Thyroid Function Tests: No results for input(s): TSH, T4TOTAL, FREET4, T3FREE, THYROIDAB in the last 72 hours. Anemia Panel: No results for input(s): VITAMINB12, FOLATE, FERRITIN, TIBC, IRON, RETICCTPCT in the last 72 hours. Urine analysis:    Component Value Date/Time   COLORURINE STRAW (A) 09/11/2019 0224   APPEARANCEUR CLEAR 09/11/2019 0224   LABSPEC 1.002 (L) 09/11/2019 0224   PHURINE 6.0 09/11/2019 0224   GLUCOSEU NEGATIVE 09/11/2019 0224   HGBUR SMALL (A) 09/11/2019 0224   BILIRUBINUR NEGATIVE 09/11/2019 0224   KETONESUR NEGATIVE 09/11/2019 0224   PROTEINUR NEGATIVE 09/11/2019 0224   NITRITE NEGATIVE 09/11/2019 0224   LEUKOCYTESUR NEGATIVE 09/11/2019 0224    Radiological Exams on Admission: CT HEAD WO CONTRAST  Result Date: 09/11/2019 CLINICAL DATA:  Nonspecific dizziness. EXAM: CT HEAD WITHOUT CONTRAST TECHNIQUE: Contiguous axial images were obtained from the base of the skull through the vertex without intravenous contrast. COMPARISON:  None. FINDINGS: Brain: No intracranial hemorrhage, mass effect, or midline shift. Brain volume is normal for age. No hydrocephalus. The  basilar cisterns are patent. No evidence of territorial infarct or acute ischemia. No extra-axial or intracranial fluid collection. Vascular: Atherosclerosis of skullbase vasculature without hyperdense vessel or abnormal calcification. Skull: Small sclerotic focus in the right posterior frontal Merleen Milliner aim is likely a bone island. No fracture. No destructive lesion. Sinuses/Orbits: Paranasal sinuses and mastoid air cells are clear. The visualized  orbits are unremarkable. Other: Known. IMPRESSION: No acute intracranial abnormality or explanation for dizziness. Electronically Signed   By: Keith Rake M.D.   On: 09/11/2019 00:38    EKG: Independently reviewed.  Sinus rhythm, borderline QT prolongation (QTC 486).  Assessment/Plan Principal Problem:   Vertigo Active Problems:   Anemia   Hypocalcemia   Hypokalemia   Alcohol intoxication (HCC)   Vertigo: Differentials include BPPV, acute alcohol intoxication, and possible brainstem stroke.  Orthostatics were negative.  Head CT negative.  Will order PT evaluation for vestibular rehab and brain MRI.  Anemia: Hemoglobin 9.0, was in the 11-12 range on labs done 3 months ago.  Patient denies any symptoms of GI bleed.  Order FOBT and anemia panel.  Hypocalcemia: Currently asymptomatic. ?Vitamin D toxicity based on history provided by the patient.  EKG with borderline QT prolongation.  Plan is to replete calcium.  Check ionized calcium, PTH, mag, Phos, and vitamin D levels.  Mild hypokalemia: Replete potassium.  Check magnesium level and replete if low.  Continue to monitor electrolytes.  Tobacco use: NicoDerm patch and counseling  Alcohol use disorder with acute intoxication: Consumes alcoholic beverages daily and at risk for withdrawal during this hospitalization.  Will order CIWA protocol; Ativan as needed.  Thiamine, folate, and multivitamin.  COPD, chronic respiratory failure: Currently satting well on 2 L home oxygen.  DuoNebs as needed.   Resume home inhalers after pharmacy med rec is done.  Pharmacy med rec pending.  DVT prophylaxis: SCDs at this time, FOBT pending Code Status: Discussed with the patient.  Limited code (no intubation or mechanical ventilation). Family Communication: No family available at this time. Disposition Plan: Anticipate discharge after clinical improvement. Consults called: None Admission status: It is my clinical opinion that referral for OBSERVATION is reasonable and necessary in this patient based on the above information provided. The aforementioned taken together are felt to place the patient at high risk for further clinical deterioration. However it is anticipated that the patient may be medically stable for discharge from the hospital within 24 to 48 hours.  The medical decision making on this patient was of high complexity and the patient is at high risk for clinical deterioration, therefore this is a level 3 visit.  Shela Leff MD [facility]  If 7PM-7AM, please contact night-coverage [email or web address]  09/11/2019, 5:55 AM

## 2019-09-11 NOTE — ED Notes (Signed)
ED TO INPATIENT HANDOFF REPORT  ED Nurse Name and Phone #: [phone]  S Name/Age/Gender Christine Serrano 59 y.o. female Room/Bed: WA18/WA18  Code Status   Code Status: Partial Code  Home/SNF/Other Home Patient oriented to: self, place, time and situation Is this baseline? Yes   Triage Complete: Triage complete  Chief Complaint Vertigo [R42]  Triage Note Per EMS, patient c/o onset of dizziness about 45 minutes ago when she was out fishing with friends. Patient states she has hx of COPD and when it is exacerbated she feels this way. Patient is normally on 2L of oxygen at home.   EMS: 18g left ac 500 mL ns      Allergies Allergies  Allergen Reactions  . Imitrex [Sumatriptan] Other (See Comments)    Worsens migraine     Level of Care/Admitting Diagnosis ED Disposition    ED Disposition Condition Comment   Admit  Hospital Area: [place] [id number]  Level of Care: Telemetry [5]  Admit to tele based on following criteria: Monitor QTC interval  Covid Evaluation: Asymptomatic Screening Protocol (No Symptoms)  Diagnosis: Vertigo [id number]  Admitting Physician: Shela Leff [id number]  Attending Physician: Shela Leff [id number]       B Medical/Surgery History Past Medical History:  Diagnosis Date  . Cervical cancer ([place])   . Chronic idiopathic constipation   . Community acquired pneumonia of right upper lobe of lung 12/10/2016  . COPD (chronic obstructive pulmonary disease) ([place])   . Depression   . Endometriosis   . HLD (hyperlipidemia)   . Smoker    Past Surgical History:  Procedure Laterality Date  . ABLATION ON ENDOMETRIOSIS    . APPENDECTOMY    . BREAST EXCISIONAL BIOPSY Left age 55   benign biopsy - chronic fatty deposit  . COLONOSCOPY WITH PROPOFOL N/A 08/04/2017   TAx3, angiodysplastic lesion, diverticulosis, rpt 5 yrs ([facility])  . EXPLORATORY LAPAROTOMY  1992   endometriosis  . FOOT SURGERY Right    needle imbeded   . LASER ABLATION CONDYLOMA CERVICAL / VULVAR    . NASAL SEPTUM SURGERY Bilateral   . OVARIAN CYST SURGERY Bilateral 1980   remote  . SALPINGOOPHORECTOMY Right 1983   ectopic pregnancy  . TONSILLECTOMY       A IV Location/Drains/Wounds Patient Lines/Drains/Airways Status   Active Line/Drains/Airways    Name:   Placement date:   Placement time:   Site:   Days:   Peripheral IV 09/10/19 Left Antecubital   09/10/19    2219    Antecubital   1          Intake/Output Last 24 hours  Intake/Output Summary (Last 24 hours) at 09/11/2019 [id number] Last data filed at 09/11/2019 0201 Gross per 24 hour  Intake 1049 ml  Output --  Net 1049 ml    Labs/Imaging Results for orders placed or performed during the hospital encounter of 09/10/19 (from the past 48 hour(s))  Basic metabolic panel     Status: Abnormal   Collection Time: 09/10/19 11:29 PM  Result Value Ref Range   Sodium 136 135 - 145 mmol/L   Potassium 3.1 (L) 3.5 - 5.1 mmol/L   Chloride 106 98 - 111 mmol/L   CO2 26 22 - 32 mmol/L   Glucose, Bld 85 70 - 99 mg/dL    Comment: Glucose reference range applies only to samples taken after fasting for at least 8 hours.   BUN 6 6 - 20 mg/dL   Creatinine, Ser 0.46 0.44 -  1.00 mg/dL   Calcium 6.7 (L) 8.9 - 10.3 mg/dL   GFR calc non Af Amer >60 >60 mL/min   GFR calc Af Amer >60 >60 mL/min   Anion gap 4 (L) 5 - 15    Comment: Performed at [facility], [place] [address], [place], [place] [address]  CBC with Differential/Platelet     Status: Abnormal   Collection Time: 09/10/19 11:29 PM  Result Value Ref Range   WBC 6.5 4.0 - 10.5 K/uL   RBC 3.10 (L) 3.87 - 5.11 MIL/uL   Hemoglobin 9.0 (L) 12.0 - 15.0 g/dL   HCT 27.6 (L) 36.0 - 46.0 %   MCV 89.0 80.0 - 100.0 fL   MCH 29.0 26.0 - 34.0 pg   MCHC 32.6 30.0 - 36.0 g/dL   RDW 14.0 11.5 - 15.5 %   Platelets 160 150 - 400 K/uL   nRBC 0.0 0.0 - 0.2 %   Neutrophils Relative % 46 %   Neutro Abs 3.1 1.7 - 7.7 K/uL   Lymphocytes  Relative 43 %   Lymphs Abs 2.8 0.7 - 4.0 K/uL   Monocytes Relative 6 %   Monocytes Absolute 0.4 0.1 - 1.0 K/uL   Eosinophils Relative 4 %   Eosinophils Absolute 0.2 0.0 - 0.5 K/uL   Basophils Relative 1 %   Basophils Absolute 0.0 0.0 - 0.1 K/uL   Immature Granulocytes 0 %   Abs Immature Granulocytes 0.01 0.00 - 0.07 K/uL    Comment: Performed at [facility], [place] [address], [place], [place] [address]  Ethanol     Status: Abnormal   Collection Time: 09/10/19 11:29 PM  Result Value Ref Range   Alcohol, Ethyl (B) 84 (H) <10 mg/dL    Comment: (NOTE) Lowest detectable limit for serum alcohol is 10 mg/dL. For medical purposes only. Performed at [facility], [place] [address] [place], [place] [address]   Albumin     Status: Abnormal   Collection Time: 09/10/19 11:29 PM  Result Value Ref Range   Albumin 3.0 (L) 3.5 - 5.0 g/dL    Comment: Performed at [facility], [place] [address], [place], [place] [address]  Urinalysis, Routine w reflex microscopic     Status: Abnormal   Collection Time: 09/11/19  2:24 AM  Result Value Ref Range   Color, Urine STRAW (A) YELLOW   APPearance CLEAR CLEAR   Specific Gravity, Urine 1.002 (L) 1.005 - 1.030   pH 6.0 5.0 - 8.0   Glucose, UA NEGATIVE NEGATIVE mg/dL   Hgb urine dipstick SMALL (A) NEGATIVE   Bilirubin Urine NEGATIVE NEGATIVE   Ketones, ur NEGATIVE NEGATIVE mg/dL   Protein, ur NEGATIVE NEGATIVE mg/dL   Nitrite NEGATIVE NEGATIVE   Leukocytes,Ua NEGATIVE NEGATIVE   RBC / HPF 0-5 0 - 5 RBC/hpf   WBC, UA 0-5 0 - 5 WBC/hpf   Bacteria, UA RARE (A) NONE SEEN   Squamous Epithelial / LPF 0-5 0 - 5    Comment: Performed at [facility], [place] [address] [place], [place] [address]  Vitamin B12     Status: None   Collection Time: 09/11/19  6:08 AM  Result Value Ref Range   Vitamin B-12 379 180 - 914 pg/mL    Comment: (NOTE) This assay is not validated for testing neonatal  or myeloproliferative syndrome specimens for Vitamin B12 levels. Performed at [facility], [place] [address], [place], [place] [address]   Folate  Status: Abnormal   Collection Time: 09/11/19  6:08 AM  Result Value Ref Range   Folate 2.7 (L) >5.9 ng/mL    Comment: Performed at [facility], [place] [address], [place], [place] [address]  Iron and TIBC     Status: Abnormal   Collection Time: 09/11/19  6:08 AM  Result Value Ref Range   Iron 120 28 - 170 ug/dL   TIBC 361 250 - 450 ug/dL   Saturation Ratios 33 (H) 10.4 - 31.8 %   UIBC 241 ug/dL    Comment: Performed at [facility], [place] [address] [place] [address] [place], [place] [address]  Ferritin     Status: None   Collection Time: 09/11/19  6:08 AM  Result Value Ref Range   Ferritin 28 11 - 307 ng/mL    Comment: Performed at [facility], [place] [address], [place], [place] [address]  Reticulocytes     Status: None   Collection Time: 09/11/19  6:08 AM  Result Value Ref Range   Retic Ct Pct 0.9 0.4 - 3.1 %   RBC. 4.30 3.87 - 5.11 MIL/uL   Retic Count, Absolute 37.4 19.0 - 186.0 K/uL   Immature Retic Fract 8.2 2.3 - 15.9 %    Comment: Performed at [facility], [place] [address], [place], [place] [address]  Magnesium     Status: None   Collection Time: 09/11/19  6:08 AM  Result Value Ref Range   Magnesium 1.7 1.7 - 2.4 mg/dL    Comment: Performed at [facility], [place] [address], [place], [place] [address]  Phosphorus     Status: None   Collection Time: 09/11/19  6:08 AM  Result Value Ref Range   Phosphorus 3.2 2.5 - 4.6 mg/dL    Comment: Performed at [facility], [place] [address], [place], [place] [address]   CT HEAD WO CONTRAST  Result Date: 09/11/2019 CLINICAL DATA:  Nonspecific dizziness. EXAM: CT HEAD WITHOUT CONTRAST TECHNIQUE: Contiguous axial images were obtained from the base of the skull through the vertex  without intravenous contrast. COMPARISON:  None. FINDINGS: Brain: No intracranial hemorrhage, mass effect, or midline shift. Brain volume is normal for age. No hydrocephalus. The basilar cisterns are patent. No evidence of territorial infarct or acute ischemia. No extra-axial or intracranial fluid collection. Vascular: Atherosclerosis of skullbase vasculature without hyperdense vessel or abnormal calcification. Skull: Small sclerotic focus in the right posterior frontal Merleen Milliner aim is likely a bone island. No fracture. No destructive lesion. Sinuses/Orbits: Paranasal sinuses and mastoid air cells are clear. The visualized orbits are unremarkable. Other: Known. IMPRESSION: No acute intracranial abnormality or explanation for dizziness. Electronically Signed   By: Keith Rake M.D.   On: 09/11/2019 00:38    Pending Labs Unresulted Labs (From admission, onward)    Start     Ordered   09/12/19 [id number]  Basic metabolic panel  Tomorrow morning,   R     09/11/19 0510   09/11/19 0511  Parathyroid hormone, intact (no Ca)  Once,   STAT     09/11/19 0510   09/11/19 0511  VITAMIN D 25 Hydroxy (Vit-D Deficiency, Fractures)  Once,   STAT     09/11/19 0510   09/11/19 0510  Calcium, ionized  Once,   STAT     09/11/19 0510   09/11/19 0509  Occult blood card to lab, stool  Once,   STAT     09/11/19 0510   09/11/19  0318  SARS CORONAVIRUS 2 (TAT 6-24 HRS) Nasopharyngeal Nasopharyngeal Swab  (Tier 3 (TAT 6-24 hrs))  Once,   STAT    Question Answer Comment  Is this test for diagnosis or screening Screening   Symptomatic for COVID-19 as defined by CDC No   Hospitalized for COVID-19 No   Admitted to ICU for COVID-19 No   Previously tested for COVID-19 Yes   Resident in a congregate (group) care setting No   Employed in healthcare setting No   Pregnant No      09/11/19 0317          Vitals/Pain Today's Vitals   09/11/19 0610 09/11/19 0630 09/11/19 0700 09/11/19 0725  BP: 121/66 131/67 118/62   Pulse: 82  88 86   Resp:  (!) 29 18   Temp:      TempSrc:      SpO2:  96% 97%   Weight:      Height:      PainSc:    0-No pain    Isolation Precautions No active isolations  Medications Medications  acetaminophen (TYLENOL) tablet 650 mg (has no administration in time range)    Or  acetaminophen (TYLENOL) suppository 650 mg (has no administration in time range)  nicotine (NICODERM CQ - dosed in mg/24 hours) patch 14 mg (has no administration in time range)  LORazepam (ATIVAN) tablet 1-4 mg (has no administration in time range)    Or  LORazepam (ATIVAN) injection 1-4 mg (has no administration in time range)  thiamine tablet 100 mg (has no administration in time range)    Or  thiamine (B-1) injection 100 mg (has no administration in time range)  folic acid (FOLVITE) tablet 1 mg (has no administration in time range)  multivitamin with minerals tablet 1 tablet (has no administration in time range)  ipratropium-albuterol (DUONEB) 0.5-2.5 (3) MG/3ML nebulizer solution 3 mL (has no administration in time range)  calcium gluconate 1 g/ 50 mL sodium chloride IVPB (0 g Intravenous Stopped 09/11/19 0201)  sodium chloride 0.9 % bolus 1,000 mL (0 mLs Intravenous Stopped 09/11/19 0139)  metoCLOPramide (REGLAN) injection 10 mg (10 mg Intravenous Given 09/11/19 0109)  diphenhydrAMINE (BENADRYL) injection 25 mg (25 mg Intravenous Given 09/11/19 0109)  calcium gluconate 1 g/ 50 mL sodium chloride IVPB (0 mg Intravenous Stopped 09/11/19 0725)  potassium chloride SA (KLOR-CON) CR tablet 40 mEq (40 mEq Oral Given 09/11/19 0606)    Mobility walks Low fall risk   Focused Assessments .  R Recommendations: See Admitting Provider Note  Report given to: Marolyn Hammock, RN  Additional Notes: n/a

## 2019-09-11 NOTE — ED Provider Notes (Signed)
[facility] DEPT Provider Note   CSN: [id number] Arrival date & time: 09/10/19  2205     History Chief Complaint  Patient presents with  . Dizziness  . Hypotension    Christine Serrano is a 59 y.o. female.  The history is provided by the patient.  Dizziness Quality:  Room spinning Severity:  Moderate Onset quality:  Gradual Timing:  Intermittent Progression:  Worsening Chronicity:  New Relieved by:  Being still Worsened by:  Movement Associated symptoms: headaches and shortness of breath   Associated symptoms: no blood in stool, no chest pain, no diarrhea, no hearing loss, no syncope, no vision changes, no vomiting and no weakness   Patient presents with dizziness.  Patient has history of COPD on chronic oxygen.  Patient reports she was out with friends fishing when she noted headache and dizziness.  She felt that the world was spinning.  No syncope.  No focal weakness.  She does admit to alcohol use but not in significant quantities. She has not had this before.  She has chronic shortness of breath and is on 2 L all the time.  She reports her portable oxygen tank was working at the time  No vomiting/diarrhea.  No rectal bleeding.    Past Medical History:  Diagnosis Date  . Cervical cancer ([place])   . Chronic idiopathic constipation   . Community acquired pneumonia of right upper lobe of lung 12/10/2016  . COPD (chronic obstructive pulmonary disease) ([place])   . Depression   . Endometriosis   . HLD (hyperlipidemia)   . Smoker     Patient Active Problem List   Diagnosis Date Noted  . Carotid stenosis, asymptomatic, left 04/06/2019  . Systolic murmur [id number]  . Left carotid bruit 02/27/2019  . Hypervitaminosis D 02/27/2019  . Medicare annual wellness visit, subsequent 02/25/2019  . Advanced directives, counseling/discussion 02/25/2019  . Epigastric abdominal pain 09/21/2018  . Epistaxis 09/21/2018  . COPD exacerbation ([place]) 09/02/2018    . Osteoporosis 05/23/2018  . Diarrhea 05/08/2018  . Numbness of right hand 10/10/2017  . Nasal sinus congestion 10/10/2017  . Benign neoplasm of ascending colon   . Benign neoplasm of transverse colon   . Benign neoplasm of sigmoid colon   . Positive hepatitis C antibody test 02/23/2017  . Health maintenance examination 02/21/2017  . HLD (hyperlipidemia) 02/21/2017  . Anxiety 12/10/2016  . Tobacco abuse 11/28/2016  . Aortic atherosclerosis ([place]) 11/26/2016  . Tachycardia 11/01/2016  . Pulmonary nodule 10/25/2016  . Moderate recurrent major depression ([place]) 07/03/2016  . Chronic constipation 07/03/2016  . Alcohol abuse, in remission 07/03/2016  . COPD (chronic obstructive pulmonary disease) ([place]) 07/03/2016  . Occasional tremors 07/03/2016  . Chronic insomnia 07/03/2016    Past Surgical History:  Procedure Laterality Date  . ABLATION ON ENDOMETRIOSIS    . APPENDECTOMY    . BREAST EXCISIONAL BIOPSY Left age 48   benign biopsy - chronic fatty deposit  . COLONOSCOPY WITH PROPOFOL N/A 08/04/2017   TAx3, angiodysplastic lesion, diverticulosis, rpt 5 yrs ([place])  . EXPLORATORY LAPAROTOMY  1992   endometriosis  . FOOT SURGERY Right    needle imbeded  . LASER ABLATION CONDYLOMA CERVICAL / VULVAR    . NASAL SEPTUM SURGERY Bilateral   . OVARIAN CYST SURGERY Bilateral 1980   remote  . SALPINGOOPHORECTOMY Right 1983   ectopic pregnancy  . TONSILLECTOMY       OB History   No obstetric history on file.  Family History  Problem Relation Age of Onset  . Heart disease Mother   . Hypertension Mother   . Hypertension Father   . COPD Father   . Hearing loss Maternal Grandmother   . Hypertension Maternal Grandmother   . Hearing loss Maternal Grandfather   . Hypertension Maternal Grandfather   . Colon cancer Paternal Grandmother     Social History   Tobacco Use  . Smoking status: Current Every Day Smoker    Packs/day: 0.50    Years: 47.00    Pack years: 23.50     Types: Cigarettes    Last attempt to quit: 07/25/2018    Years since quitting: 1.1  . Smokeless tobacco: Never Used  . Tobacco comment: 9/18 down to 6-7 per day, set quit date of 10/1  Substance Use Topics  . Alcohol use: Yes    Comment: occasional  . Drug use: No    Home Medications Prior to Admission medications   Medication Sig Start Date End Date Taking? Authorizing Provider  albuterol (VENTOLIN HFA) 108 (90 Base) MCG/ACT inhaler Inhale 2 puffs into the lungs every 6 (six) hours as needed for wheezing or shortness of breath. 07/26/19   Ria Bush, MD  aspirin EC 81 MG tablet Take 81 mg by mouth daily.    [provider]  buPROPion (WELLBUTRIN SR) 100 MG 12 hr tablet Take 1 tablet (100 mg total) by mouth daily. 07/12/19   Ria Bush, MD  Calcium Ascorbate 500 MG TABS Take 1 tablet (500 mg total) by mouth daily. 07/01/19   Ria Bush, MD  fluticasone [facility]) 50 MCG/ACT nasal spray Place 2 sprays into both nostrils daily. 07/12/19   Ria Bush, MD  gabapentin (NEURONTIN) 100 MG capsule Take 1 capsule (100 mg total) by mouth 2 (two) times daily. 07/19/19   Ria Bush, MD  gentamicin cream (GARAMYCIN) 0.1 % Apply 1 application topically in the morning and at bedtime for 14 days. Apply topically twice a day on affected nail 09/10/19 09/24/19  Edrick Kins, DPM  hydrOXYzine (ATARAX/VISTARIL) 25 MG tablet Take 1 tablet (25 mg total) by mouth 2 (two) times daily as needed for anxiety. 07/12/19   Ria Bush, MD  ipratropium-albuterol (DUONEB) 0.5-2.5 (3) MG/3ML SOLN Inhale 3 mLs into the lungs every 6 (six) hours as needed. 07/13/19   Ria Bush, MD  LINZESS 290 MCG CAPS capsule Take 1 capsule (290 mcg total) by mouth daily as needed (constipation). 07/13/19   Ria Bush, MD  Multiple Vitamins-Minerals (MULTIVITAMIN ADULT) TABS Take 1 tablet by mouth daily.    [provider]  naproxen (NAPROSYN) 375 MG tablet TAKE 1 TABLET (375 MG  TOTAL) BY MOUTH 2 (TWO) TIMES DAILY AS NEEDED FOR MODERATE PAIN. 09/01/19   Ria Bush, MD  nicotine (NICODERM CQ) 14 mg/24hr patch Place 1 patch (14 mg total) onto the skin daily. 05/26/19   Ria Bush, MD  omeprazole (PRILOSEC) 40 MG capsule TAKE 1 CAPSULE BY MOUTH DAILY. FOR THREE WEEKS THEN AS NEEDED. TAKE 30 MIN PRIOR TO LARGE MEAL 07/12/19   Ria Bush, MD  OXYGEN Inhale 3 L/min into the lungs continuous.    [provider]  sertraline (ZOLOFT) 100 MG tablet Take 1.5 tablets (150 mg total) by mouth daily. 07/19/19   Ria Bush, MD  SYMBICORT 160-4.5 MCG/ACT inhaler Inhale 2 puffs into the lungs 2 (two) times daily. 07/20/19   Ria Bush, MD  tiotropium (SPIRIVA) 18 MCG inhalation capsule Place 1 capsule (18 mcg total) into  inhaler and inhale daily. 07/12/19   Ria Bush, MD  tiZANidine (ZANAFLEX) 2 MG tablet TAKE 1 TABLET (2 MG TOTAL) BY MOUTH 2 (TWO) TIMES DAILY AS NEEDED FOR MUSCLE SPASMS (NECK PAIN). 06/05/19   Ria Bush, MD  traZODone (DESYREL) 50 MG tablet Take 1 tablet (50 mg total) by mouth at bedtime. 07/12/19   Ria Bush, MD  vitamin B-12 (CYANOCOBALAMIN) 1000 MCG tablet Take 1,000 mcg by mouth daily.    [provider]  vitamin E 400 UNIT capsule Take 400 Units by mouth daily.     [provider]    Allergies    Imitrex [sumatriptan]  Review of Systems   Review of Systems  Constitutional: Negative for fever.  HENT: Negative for hearing loss.   Eyes: Negative for visual disturbance.  Respiratory: Positive for shortness of breath.   Cardiovascular: Negative for chest pain and syncope.  Gastrointestinal: Negative for blood in stool, diarrhea and vomiting.  Neurological: Positive for dizziness and headaches. Negative for syncope and weakness.  All other systems reviewed and are negative.   Physical Exam Updated Vital Signs BP 129/74   Pulse 96   Temp 98.6 F (37 C) (Oral)   Resp (!) 21   Ht  1.626 m (5\' 4" )   Wt 50.8 kg   SpO2 95%   BMI 19.22 kg/m   Physical Exam CONSTITUTIONAL: thin appearing HEAD: Normocephalic/atraumatic EYES: EOMI/PERRL, no nystagmus,  no ptosis ENMT: Mucous membranes moist NECK: supple no meningeal signs, no bruits CV: S1/S2 noted, no murmurs/rubs/gallops noted LUNGS: Coarse wheezing bilaterally no apparent distress ABDOMEN: soft, nontender, no rebound or guarding GU:no cva tenderness NEURO:Awake/alert, face symmetric, no arm or leg drift is noted Equal 5/5 strength with shoulder abduction, elbow flex/extension, wrist flex/extension in upper extremities and equal hand grips bilaterally Equal 5/5 strength with hip flexion,knee flex/extension, foot dorsi/plantar flexion Cranial nerves 3/4/5/6/12/23/08/11/12 tested and intact No past pointing Sensation to light touch intact in all extremities EXTREMITIES: pulses normal, full ROM SKIN: warm, color normal PSYCH: no abnormalities of mood noted   ED Results / Procedures / Treatments   Labs (all labs ordered are listed, but only abnormal results are displayed) Labs Reviewed  BASIC METABOLIC PANEL - Abnormal; Notable for the following components:      Result Value   Potassium 3.1 (*)    Calcium 6.7 (*)    Anion gap 4 (*)    All other components within normal limits  CBC WITH DIFFERENTIAL/PLATELET - Abnormal; Notable for the following components:   RBC 3.10 (*)    Hemoglobin 9.0 (*)    HCT 27.6 (*)    All other components within normal limits  ETHANOL - Abnormal; Notable for the following components:   Alcohol, Ethyl (B) 84 (*)    All other components within normal limits  URINALYSIS, ROUTINE W REFLEX MICROSCOPIC - Abnormal; Notable for the following components:   Color, Urine STRAW (*)    Specific Gravity, Urine 1.002 (*)    Hgb urine dipstick SMALL (*)    Bacteria, UA RARE (*)    All other components within normal limits  ALBUMIN - Abnormal; Notable for the following components:   Albumin  3.0 (*)    All other components within normal limits  SARS CORONAVIRUS 2 (TAT 6-24 HRS)    EKG EKG Interpretation  Date/Time:  Friday September 10 2019 23:28:39 EDT Ventricular Rate:  91 PR Interval:    QRS Duration: 98 QT Interval:  395 QTC Calculation: 486 R Axis:  92 Text Interpretation: Sinus rhythm Probable left atrial enlargement Right axis deviation Nonspecific T abnrm, anterolateral leads Borderline prolonged QT interval No significant change since last tracing Confirmed by Ripley Fraise [phone]) on 09/10/2019 11:39:12 PM   Radiology CT HEAD WO CONTRAST  Result Date: 09/11/2019 CLINICAL DATA:  Nonspecific dizziness. EXAM: CT HEAD WITHOUT CONTRAST TECHNIQUE: Contiguous axial images were obtained from the base of the skull through the vertex without intravenous contrast. COMPARISON:  None. FINDINGS: Brain: No intracranial hemorrhage, mass effect, or midline shift. Brain volume is normal for age. No hydrocephalus. The basilar cisterns are patent. No evidence of territorial infarct or acute ischemia. No extra-axial or intracranial fluid collection. Vascular: Atherosclerosis of skullbase vasculature without hyperdense vessel or abnormal calcification. Skull: Small sclerotic focus in the right posterior frontal Merleen Milliner aim is likely a bone island. No fracture. No destructive lesion. Sinuses/Orbits: Paranasal sinuses and mastoid air cells are clear. The visualized orbits are unremarkable. Other: Known. IMPRESSION: No acute intracranial abnormality or explanation for dizziness. Electronically Signed   By: Keith Rake M.D.   On: 09/11/2019 00:38    Procedures Procedures  Medications Ordered in ED Medications  calcium gluconate 1 g/ 50 mL sodium chloride IVPB (0 g Intravenous Stopped 09/11/19 0201)  sodium chloride 0.9 % bolus 1,000 mL (0 mLs Intravenous Stopped 09/11/19 0139)  metoCLOPramide (REGLAN) injection 10 mg (10 mg Intravenous Given 09/11/19 0109)  diphenhydrAMINE (BENADRYL)  injection 25 mg (25 mg Intravenous Given 09/11/19 0109)    ED Course  I have reviewed the triage vital signs and the nursing notes.  Pertinent labs & imaging results that were available during my care of the patient were reviewed by me and considered in my medical decision making (see chart for details).    MDM Rules/Calculators/A&P                      12:15 AM Patient presents with dizziness of unclear etiology.  She also reports headache as well.  These are all new symptoms for no new medications.  Patient reports she just does not feel right.  No changes in her orthostatic blood pressure.  She also has hypocalcemia of unclear etiology For headache and dizziness, will start with CT head.  At this time no signs of acute stroke on initial examination Pt has mild anemia, but denies recent blood loss 4:16 AM Patient still feeling dizzy, but she is able to ambulate.  CT head negative.  It is noted the patient has significant hypocalcemia.  Even when corrected for hypoalbuminemia it is still low She reports a history of hypocalcemia and has been on supplementation but is not at this time she is not on diuretics Unclear etiology of hypocalcemia.  She also has mildly prolonged QT on EKG.  We will admit for calcium supplementation. Patient agreed with plan.  Discussed with Dr. Marlowe Sax for admission Final Clinical Impression(s) / ED Diagnoses Final diagnoses:  Hypocalcemia  Dizziness  Chronic obstructive pulmonary disease, unspecified COPD type [facility])    Rx / DC Orders ED Discharge Orders    None       Ripley Fraise, MD 09/11/19 [phone]

## 2019-09-11 NOTE — ED Notes (Signed)
Pt is ambulatory to restroom with no assistance or gait abnormalities.

## 2019-09-12 DIAGNOSIS — R42 Dizziness and giddiness: Secondary | ICD-10-CM | POA: Diagnosis not present

## 2019-09-12 DIAGNOSIS — F10929 Alcohol use, unspecified with intoxication, unspecified: Secondary | ICD-10-CM | POA: Diagnosis not present

## 2019-09-12 DIAGNOSIS — E876 Hypokalemia: Secondary | ICD-10-CM

## 2019-09-12 LAB — MAGNESIUM: Magnesium: 1.8 mg/dL (ref 1.7–2.4)

## 2019-09-12 LAB — BASIC METABOLIC PANEL
Anion gap: 8 (ref 5–15)
BUN: 13 mg/dL (ref 6–20)
CO2: 31 mmol/L (ref 22–32)
Calcium: 8.9 mg/dL (ref 8.9–10.3)
Chloride: 102 mmol/L (ref 98–111)
Creatinine, Ser: 0.56 mg/dL (ref 0.44–1.00)
GFR calc Af Amer: >60 mL/min (ref >60)
GFR calc non Af Amer: >60 mL/min (ref >60)
Glucose, Bld: 98 mg/dL (ref 70–99)
Potassium: 4.1 mmol/L (ref 3.5–5.1)
Sodium: 141 mmol/L (ref 135–145)

## 2019-09-12 LAB — CBC WITH DIFFERENTIAL/PLATELET
Abs Immature Granulocytes: 0.01 10*3/uL (ref 0.00–0.07)
Basophils Absolute: 0 10*3/uL (ref 0.0–0.1)
Basophils Relative: 1 %
Eosinophils Absolute: 0.3 10*3/uL (ref 0.0–0.5)
Eosinophils Relative: 5 %
HCT: 34.4 % — ABNORMAL LOW (ref 36.0–46.0)
Hemoglobin: 11.1 g/dL — ABNORMAL LOW (ref 12.0–15.0)
Immature Granulocytes: 0 %
Lymphocytes Relative: 41 %
Lymphs Abs: 1.9 10*3/uL (ref 0.7–4.0)
MCH: 28.5 pg (ref 26.0–34.0)
MCHC: 32.3 g/dL (ref 30.0–36.0)
MCV: 88.4 fL (ref 80.0–100.0)
Monocytes Absolute: 0.3 10*3/uL (ref 0.1–1.0)
Monocytes Relative: 7 %
Neutro Abs: 2.2 10*3/uL (ref 1.7–7.7)
Neutrophils Relative %: 46 %
Platelets: 192 10*3/uL (ref 150–400)
RBC: 3.89 MIL/uL (ref 3.87–5.11)
RDW: 14.1 % (ref 11.5–15.5)
WBC: 4.7 10*3/uL (ref 4.0–10.5)
nRBC: 0 % (ref 0.0–0.2)

## 2019-09-12 LAB — PARATHYROID HORMONE, INTACT (NO CA): PTH: 19 pg/mL (ref 15–65)

## 2019-09-12 MED ORDER — GABAPENTIN 100 MG PO CAPS: 100.0000 mg | ORAL_CAPSULE | Freq: Two times a day (BID) | ORAL | Status: DC

## 2019-09-12 MED ORDER — FOLIC ACID 1 MG PO TABS: 1.0000 mg | ORAL_TABLET | Freq: Every day | ORAL | 0 refills | Status: DC

## 2019-09-12 MED ORDER — GABAPENTIN 100 MG PO CAPS
100.0000 mg | ORAL_CAPSULE | Freq: Two times a day (BID) | ORAL | Status: DC
  Administered 2019-09-12: 100 mg via ORAL
  Filled 2019-09-12: qty 1

## 2019-09-12 MED ORDER — ADULT MULTIVITAMIN W/MINERALS CH: 1.0000 | ORAL_TABLET | Freq: Every day | ORAL | Status: DC

## 2019-09-12 MED ORDER — MECLIZINE HCL 25 MG PO TABS: 25.0000 mg | ORAL_TABLET | Freq: Three times a day (TID) | ORAL | 0 refills | Status: DC | PRN

## 2019-09-12 MED ORDER — THIAMINE HCL 100 MG PO TABS: 100.0000 mg | ORAL_TABLET | Freq: Every day | ORAL | 0 refills | Status: DC

## 2019-09-12 NOTE — Discharge Summary (Signed)
Physician Discharge Summary  Christine Serrano [id number] DOB: [birth year] DOA: 09/10/2019  PCP: Ria Bush, MD  Admit date: 09/10/2019 Discharge date: 09/12/2019  Admitted From: Home Disposition: Home  Recommendations for Outpatient Follow-up:  1. Follow up with PCP in 1 week with repeat CBC/BMP 2. Follow up in ED if symptoms worsen or new appear   Home Health: No Equipment/Devices: None  Discharge Condition: Stable CODE STATUS: Full Diet recommendation: Regular.  Patient refused heart healthy diet during hospitalization  Brief/Interim Summary: 59 year old female with history of COPD, chronic respiratory failure with hypoxia on 2 L home oxygen, depression, endometriosis, cervical cancer, hyperlipidemia, tobacco abuse presented with dizziness.  On presentation, orthostatics were negative.  Blood alcohol level was 84.  UA was negative for infection.  CT of the head was negative for any acute intracranial abnormality.  During the hospitalization, MRI of the brain was negative for acute infarct.  Subsequently, her dizziness has resolved.  She has tolerated physical therapy.  She will be discharged home with outpatient follow-up with PCP.  Discharge Diagnoses:   Dizziness/vertigo -Questionable cause.  CT and MRI of the brain was negative for acute stroke.  Blood alcohol level was 84 on presentation. -Orthostatics negative. -Patient has tolerated physical therapy -Symptoms have resolved.  Will discharge home on as needed meclizine.  Outpatient follow-up with PCP.  Abstain from alcohol.  I have held hydroxyzine and tizanidine and trazodone on discharge.  Outpatient follow-up with PCP regarding need for these medications.  Anemia of chronic disease -Chronic normocytic anemia.  Hemoglobin stable.  Outpatient follow-up  Hypocalcemia -Questionable cause.  Resolved  Tobacco use -Patient was counseled regarding tobacco cessation by admitting hospitalist  Mild  hypokalemia -Improved  Alcohol use disorder with an acute intoxication -Patient was intoxicated on presentation.  No signs of withdrawal during the hospitalization.  Continue thiamine, folate and multivitamin during the hospitalization.  Patient needs to abstain from alcohol  COPD Chronic hypoxic respiratory failure -Continue oxygen on discharge at 2 L/min.  Outpatient follow-up -Currently stable  Discharge Instructions  Discharge Instructions    Diet general   Complete by: As directed    Increase activity slowly   Complete by: As directed      Allergies as of 09/12/2019      Reactions   Imitrex [sumatriptan] Other (See Comments)   Worsens migraine       Medication List    STOP taking these medications   hydrOXYzine 25 MG tablet Commonly known as: ATARAX/VISTARIL   nicotine 14 mg/24hr patch Commonly known as: Nicoderm CQ   tiZANidine 2 MG tablet Commonly known as: ZANAFLEX   traZODone 50 MG tablet Commonly known as: DESYREL     TAKE these medications   albuterol 108 (90 Base) MCG/ACT inhaler Commonly known as: VENTOLIN HFA Inhale 2 puffs into the lungs every 6 (six) hours as needed for wheezing or shortness of breath.   aspirin EC 81 MG tablet Take 81 mg by mouth daily.   buPROPion 100 MG 12 hr tablet Commonly known as: WELLBUTRIN SR Take 1 tablet (100 mg total) by mouth daily.   Calcium Ascorbate 500 MG Tabs Take 1 tablet (500 mg total) by mouth daily.   fluticasone 50 MCG/ACT nasal spray Commonly known as: FLONASE Place 2 sprays into both nostrils daily.   folic acid 1 MG tablet Commonly known as: FOLVITE Take 1 tablet (1 mg total) by mouth daily.   gabapentin 100 MG capsule Commonly known as: NEURONTIN Take 1 capsule (100 mg total)  by mouth 2 (two) times daily.   gentamicin cream 0.1 % Commonly known as: GARAMYCIN Apply 1 application topically in the morning and at bedtime for 14 days. Apply topically twice a day on affected nail    ipratropium-albuterol 0.5-2.5 (3) MG/3ML Soln Commonly known as: DUONEB Inhale 3 mLs into the lungs every 6 (six) hours as needed.   Linzess 290 MCG Caps capsule Generic drug: linaclotide Take 1 capsule (290 mcg total) by mouth daily as needed (constipation).   meclizine 25 MG tablet Commonly known as: ANTIVERT Take 1 tablet (25 mg total) by mouth 3 (three) times daily as needed for dizziness.   multivitamin with minerals Tabs tablet Take 1 tablet by mouth daily.   naproxen 375 MG tablet Commonly known as: NAPROSYN TAKE 1 TABLET (375 MG TOTAL) BY MOUTH 2 (TWO) TIMES DAILY AS NEEDED FOR MODERATE PAIN. What changed: reasons to take this   omeprazole 40 MG capsule Commonly known as: PRILOSEC TAKE 1 CAPSULE BY MOUTH DAILY. FOR THREE WEEKS THEN AS NEEDED. TAKE 30 MIN PRIOR TO LARGE MEAL What changed:   how much to take  how to take this  when to take this  reasons to take this  additional instructions   OXYGEN Inhale 3 L/min into the lungs continuous.   sertraline 100 MG tablet Commonly known as: ZOLOFT Take 1.5 tablets (150 mg total) by mouth daily.   Symbicort 160-4.5 MCG/ACT inhaler Generic drug: budesonide-formoterol Inhale 2 puffs into the lungs 2 (two) times daily.   thiamine 100 MG tablet Take 1 tablet (100 mg total) by mouth daily.   tiotropium 18 MCG inhalation capsule Commonly known as: SPIRIVA Place 1 capsule (18 mcg total) into inhaler and inhale daily.   vitamin B-12 1000 MCG tablet Commonly known as: CYANOCOBALAMIN Take 1,000 mcg by mouth daily.   vitamin E 180 MG (400 UNITS) capsule Take 400 Units by mouth daily.      Follow-up Information    Ria Bush, MD. Schedule an appointment as soon as possible for a visit in 1 week(s).   Specialty: Family Medicine Why: with cbc/bmp  Contact information: [place] [address] [phone]          Allergies  Allergen Reactions  . Imitrex [Sumatriptan] Other  (See Comments)    Worsens migraine     Consultations:  None   Procedures/Studies: CT HEAD WO CONTRAST  Result Date: 09/11/2019 CLINICAL DATA:  Nonspecific dizziness. EXAM: CT HEAD WITHOUT CONTRAST TECHNIQUE: Contiguous axial images were obtained from the base of the skull through the vertex without intravenous contrast. COMPARISON:  None. FINDINGS: Brain: No intracranial hemorrhage, mass effect, or midline shift. Brain volume is normal for age. No hydrocephalus. The basilar cisterns are patent. No evidence of territorial infarct or acute ischemia. No extra-axial or intracranial fluid collection. Vascular: Atherosclerosis of skullbase vasculature without hyperdense vessel or abnormal calcification. Skull: Small sclerotic focus in the right posterior frontal Merleen Milliner aim is likely a bone island. No fracture. No destructive lesion. Sinuses/Orbits: Paranasal sinuses and mastoid air cells are clear. The visualized orbits are unremarkable. Other: Known. IMPRESSION: No acute intracranial abnormality or explanation for dizziness. Electronically Signed   By: Keith Rake M.D.   On: 09/11/2019 00:38   MR BRAIN WO CONTRAST  Result Date: 09/11/2019 CLINICAL DATA:  Central vertigo EXAM: MRI HEAD WITHOUT CONTRAST TECHNIQUE: Multiplanar, multiecho pulse sequences of the brain and surrounding structures were obtained without intravenous contrast. COMPARISON:  CT head 09/11/2019 FINDINGS: Brain: Motion  degraded study. No acute infarct. Few small hyperintensities in the deep white matter bilaterally. Brainstem normal. Negative for hemorrhage or mass. Ventricle size normal. Vascular: Normal arterial flow voids. Skull and upper cervical spine: No focal skeletal lesion. Sinuses/Orbits: Paranasal sinuses clear. Mild right mastoid effusion. No orbital lesion. Other: None IMPRESSION: Negative for acute infarct. Mild white matter changes consistent with chronic microvascular ischemia Motion degraded study. Electronically  Signed   By: Franchot Gallo M.D.   On: 09/11/2019 09:57       Subjective: Patient seen and examined at bedside.  She was to go home.  She feels much better and feels her dizziness has improved.  No overnight fever, chest pains reported.  Discharge Exam: Vitals:   09/12/19 0606 09/12/19 0933  BP: 132/75   Pulse: 79   Resp: 16   Temp: 97.9 F (36.6 C)   SpO2: 100% 94%    General: Pt is alert, awake, not in acute distress.  Looks very thinly built and chronically ill.  On supplemental oxygen at 2 L/min Cardiovascular: rate controlled, S1/S2 + Respiratory: bilateral decreased breath sounds at bases with some scattered crackles Abdominal: Soft, NT, ND, bowel sounds + Extremities: no edema, no cyanosis    The results of significant diagnostics from this hospitalization (including imaging, microbiology, ancillary and laboratory) are listed below for reference.     Microbiology: Recent Results (from the past 240 hour(s))  SARS CORONAVIRUS 2 (TAT 6-24 HRS) Nasopharyngeal Nasopharyngeal Swab     Status: None   Collection Time: 09/11/19  3:44 AM   Specimen: Nasopharyngeal Swab  Result Value Ref Range Status   SARS Coronavirus 2 NEGATIVE NEGATIVE Final    Comment: (NOTE) SARS-CoV-2 target nucleic acids are NOT DETECTED. The SARS-CoV-2 RNA is generally detectable in upper and lower respiratory specimens during the acute phase of infection. Negative results do not preclude SARS-CoV-2 infection, do not rule out co-infections with other pathogens, and should not be used as the sole basis for treatment or other patient management decisions. Negative results must be combined with clinical observations, patient history, and epidemiological information. The expected result is Negative. Fact Sheet for Patients: SugarRoll.be Fact Sheet for Healthcare Providers: https://www.woods-mathews.com/ This test is not yet approved or cleared by the [place] FDA and  has been authorized for detection and/or diagnosis of SARS-CoV-2 by FDA under an Emergency Use Authorization (EUA). This EUA will remain  in effect (meaning this test can be used) for the duration of the COVID-19 declaration under Section 56 4(b)(1) of the Act, 21 U.S.C. section 360bbb-3(b)(1), unless the authorization is terminated or revoked sooner. Performed at [facility], [place] [address] [place], [place] [address]      Labs: BNP (last 3 results) No results for input(s): BNP in the last 8760 hours. Basic Metabolic Panel: Recent Labs  Lab 09/10/19 2329 09/11/19 0608 09/12/19 0408  NA 136  --  141  K 3.1*  --  4.1  CL 106  --  102  CO2 26  --  31  GLUCOSE 85  --  98  BUN 6  --  13  CREATININE 0.46  --  0.56  CALCIUM 6.7*  --  8.9  MG  --  1.7 1.8  PHOS  --  3.2  --    Liver Function Tests: Recent Labs  Lab 09/10/19 2329  ALBUMIN 3.0*   No results for input(s): LIPASE, AMYLASE in the last 168 hours. No results for input(s): AMMONIA in the last  168 hours. CBC: Recent Labs  Lab 09/10/19 2329 09/12/19 0408  WBC 6.5 4.7  NEUTROABS 3.1 2.2  HGB 9.0* 11.1*  HCT 27.6* 34.4*  MCV 89.0 88.4  PLT 160 192   Cardiac Enzymes: No results for input(s): CKTOTAL, CKMB, CKMBINDEX, TROPONINI in the last 168 hours. BNP: Invalid input(s): POCBNP CBG: No results for input(s): GLUCAP in the last 168 hours. D-Dimer No results for input(s): DDIMER in the last 72 hours. Hgb A1c No results for input(s): HGBA1C in the last 72 hours. Lipid Profile No results for input(s): CHOL, HDL, LDLCALC, TRIG, CHOLHDL, LDLDIRECT in the last 72 hours. Thyroid function studies No results for input(s): TSH, T4TOTAL, T3FREE, THYROIDAB in the last 72 hours.  Invalid input(s): FREET3 Anemia work up Recent Labs    09/11/19 0608  VITAMINB12 379  FOLATE 2.7*  FERRITIN 28  TIBC 361  IRON 120  RETICCTPCT 0.9   Urinalysis    Component Value Date/Time   COLORURINE  STRAW (A) 09/11/2019 0224   APPEARANCEUR CLEAR 09/11/2019 0224   LABSPEC 1.002 (L) 09/11/2019 0224   PHURINE 6.0 09/11/2019 0224   GLUCOSEU NEGATIVE 09/11/2019 0224   HGBUR SMALL (A) 09/11/2019 0224   BILIRUBINUR NEGATIVE 09/11/2019 0224   KETONESUR NEGATIVE 09/11/2019 0224   PROTEINUR NEGATIVE 09/11/2019 0224   NITRITE NEGATIVE 09/11/2019 0224   LEUKOCYTESUR NEGATIVE 09/11/2019 0224   Sepsis Labs Invalid input(s): PROCALCITONIN,  WBC,  LACTICIDVEN Microbiology Recent Results (from the past 240 hour(s))  SARS CORONAVIRUS 2 (TAT 6-24 HRS) Nasopharyngeal Nasopharyngeal Swab     Status: None   Collection Time: 09/11/19  3:44 AM   Specimen: Nasopharyngeal Swab  Result Value Ref Range Status   SARS Coronavirus 2 NEGATIVE NEGATIVE Final    Comment: (NOTE) SARS-CoV-2 target nucleic acids are NOT DETECTED. The SARS-CoV-2 RNA is generally detectable in upper and lower respiratory specimens during the acute phase of infection. Negative results do not preclude SARS-CoV-2 infection, do not rule out co-infections with other pathogens, and should not be used as the sole basis for treatment or other patient management decisions. Negative results must be combined with clinical observations, patient history, and epidemiological information. The expected result is Negative. Fact Sheet for Patients: SugarRoll.be Fact Sheet for Healthcare Providers: https://www.woods-mathews.com/ This test is not yet approved or cleared by the [place] FDA and  has been authorized for detection and/or diagnosis of SARS-CoV-2 by FDA under an Emergency Use Authorization (EUA). This EUA will remain  in effect (meaning this test can be used) for the duration of the COVID-19 declaration under Section 56 4(b)(1) of the Act, 21 U.S.C. section 360bbb-3(b)(1), unless the authorization is terminated or revoked sooner. Performed at [facility], [place] [address],  [place], [place] [address]      Time coordinating discharge: 35 minutes  SIGNED:   Aline August, MD  [facility] 09/12/2019, 9:37 AM

## 2019-09-12 NOTE — Discharge Instructions (Signed)
Calcium Content in Foods Calcium is the most abundant mineral in your body. Most of your body's calcium supply is stored in your bones and teeth. Calcium helps many parts of the body function normally, including:  Blood and blood vessels.  Nerves.  Hormones.  Muscles.  Bones and teeth. When your calcium stores are low, you may be at risk for low bone mass, bone loss, and broken bones (fractures). When you get enough calcium, it helps to support strong bones and teeth throughout your life. Calcium is especially important for:  Children during growth spurts.  Girls during adolescence.  Women who are pregnant or breastfeeding.  Women after their menstrual cycle stops (postmenopause).  Women whose menstrual cycle has stopped due to anorexia nervosa or regular intense exercise.  People who cannot eat or digest dairy products.  Vegans. What are tips for getting more calcium? General information  Try to get most of your calcium from food. Eat foods that are high in calcium.  Some people may benefit from taking calcium supplements. Check with your health care provider or diet and nutrition specialist (dietitian) before starting any calcium supplements. Calcium supplements may interact with certain medicines. Too much calcium may cause other health problems, like constipation and kidney stones.  For the body to absorb calcium, it needs vitamin D. Sources of vitamin D include: ? Skin exposure to direct sunlight. ? Foods, such as egg yolks, liver, saltwater fish, and fortified milk. ? Vitamin D supplements. Check with your health care provider or dietitian before starting any vitamin D supplements. What foods are high in calcium?  High-calcium foods are those that contain more than 100 milligrams (mg) of calcium per serving. Fruits  Fortified orange or other fruit juice, 300 mg per 8 oz serving. Vegetables  Collard greens, 360 mg per 8 oz serving.  Kale, 180 mg per 8 oz  serving.  Bok choy, 160 mg per 8 oz serving. Grains  Fortified ready-to-eat cereals, 100-1,000 mg per 8 oz serving.  Fortified frozen waffles, 200 mg in two waffles. Meats and other proteins  Sardines, canned with bones, 325 mg per 3 oz serving.  Salmon, canned with bones, 180 mg per 3 oz serving.  Canned shrimp, 125 mg per 3 oz serving.  Baked beans, 160 mg per 4 oz serving. Dairy  Yogurt, plain, low-fat, 310 mg per 6 oz serving.  Milk, 300 mg per 8 oz serving.  American cheese, 195 mg per 1 oz serving.  Cheddar cheese, 205 mg per 1 oz serving.  Cottage cheese 2%, 105 mg per 4 oz serving.  Fortified soy, rice, or almond milk, 300 mg per 8 oz serving. The items listed above may not be a complete list of foods high in calcium. Actual amounts of calcium may be different depending on processing. Contact a dietitian for more information. What foods are lower in calcium? Foods lower in calcium are those that contain 50 mg of calcium or less per serving. Fruits  Apple, about 6 mg in one apple.  Banana, about 12 mg in one banana. Vegetables  Lettuce, 19 mg per 2 oz serving.  Tomato, about 11 mg in one tomato. Grains  Rice, 4 mg per 6 oz serving.  Boiled potatoes, 14 mg per 8 oz serving.  White bread, 6 mg in one slice. Meats and other proteins  Egg, 27 mg per 2 oz serving.  Red meat, 7 mg per 4 oz serving.  Chicken, 17 mg per 4 oz serving.  Fish, cod  or trout, 20 mg per 4 oz serving. The items listed above may not be a complete list of foods lower in calcium. Actual amounts of calcium may be different depending on processing. Contact a dietitian for more information. Summary  Calcium is an important mineral in the body because it affects many functions. Getting enough calcium helps support strong bones and teeth throughout your life.  Try to get most of your calcium from food.  Calcium supplements may interact with certain medicines. Check with your health  care provider before starting any calcium supplements. This information is not intended to replace advice given to you by your health care provider. Make sure you discuss any questions you have with your health care provider. Document Revised: 05/27/2017 Document Reviewed: 05/27/2017 Elsevier Patient Education  2020 Elsevier Inc.  

## 2019-09-12 NOTE — Progress Notes (Signed)
Patient ambulated 180 ft on O2 and had no c/o dizziness or light-headedness. Md notified and pt ok for discharge. Eulas Post, RN

## 2019-09-13 ENCOUNTER — Telehealth: Payer: Self-pay | Admitting: Family Medicine

## 2019-09-13 LAB — CALCIUM, IONIZED: Calcium, Ionized, Serum: 4.7 mg/dL (ref 4.5–5.6)

## 2019-09-13 NOTE — Telephone Encounter (Signed)
Spoke with pt relaying Dr. G's message. Pt verbalizes understanding.  

## 2019-09-13 NOTE — Telephone Encounter (Signed)
Patient called.  Patient was in [place] for dizziness and discharged yesterday.  Patient called to schedule a follow up appointment with Dr.G.  Patient scheduled next available appointment on 09/16/19.  Patient said they didn't give her any of her respiratory medications while she was in the hospital and she just had a breathing treatment yesterday at the hospital.  Patient was taken off of Gabapentin, Trazodone, and Hydroxizine. Patient wanted to know if it's okay for her to stay off of the medications.  Please call patient.  Patient said the dizziness is gone. Patient uses [facility] or [facility].  Please call patient back about medication.

## 2019-09-13 NOTE — Telephone Encounter (Signed)
Stay off hydroxyzine for now.  Would leave trazodone as needed for sleep but only take 1/2 tab if needed.  Could stay off gabapentin for now as well, will review at [place].

## 2019-09-14 NOTE — Progress Notes (Signed)
   Subjective: Patient presents today for evaluation of intermittent throbbing pain to the medial border of the left great toe that began over two years ago. She reports associated redness and swelling of the toe. Patient is concerned for possible ingrown nail. Walking increases the pain. She has been trimming the nails herself for treatment. Patient presents today for further treatment and evaluation.  Past Medical History:  Diagnosis Date  . Cervical cancer ([place])   . Chronic idiopathic constipation   . Community acquired pneumonia of right upper lobe of lung 12/10/2016  . COPD (chronic obstructive pulmonary disease) ([place])   . Depression   . Endometriosis   . HLD (hyperlipidemia)   . Smoker     Objective:  General: Well developed, nourished, in no acute distress, alert and oriented x3   Dermatology: Skin is warm, dry and supple bilateral. Medial border of the left great toe appears to be erythematous with evidence of an ingrowing nail. Pain on palpation noted to the border of the nail fold. The remaining nails appear unremarkable at this time. There are no open sores, lesions.  Vascular: Dorsalis Pedis artery and Posterior Tibial artery pedal pulses palpable. No lower extremity edema noted.   Neruologic: Grossly intact via light touch bilateral.  Musculoskeletal: Muscular strength within normal limits in all groups bilateral. Normal range of motion noted to all pedal and ankle joints.   Assesement: #1 Paronychia with ingrowing nail medial border left great toe  #2 Pain in toe #3 Incurvated nail  Plan of Care:  1. Patient evaluated.  2. Discussed treatment alternatives and plan of care. Explained nail avulsion procedure and post procedure course to patient. 3. Patient opted for permanent partial nail avulsion of the medial border left great toe.  4. Prior to procedure, local anesthesia infiltration utilized using 3 ml of a 50:50 mixture of 2% plain lidocaine and 0.5% plain marcaine  in a normal hallux block fashion and a betadine prep performed.  5. Partial permanent nail avulsion with chemical matrixectomy performed using [id number] applications of phenol followed by alcohol flush.  6. Light dressing applied. 7. Prescription for Gentamicin cream provided to patient to use daily with a bandage.  8. Return to clinic in 2 weeks.  Edrick Kins, DPM [facility]  Dr. Edrick Kins, [place]                                        [place], [place] [address]                Office [phone]  Fax [phone]

## 2019-09-16 ENCOUNTER — Ambulatory Visit (INDEPENDENT_AMBULATORY_CARE_PROVIDER_SITE_OTHER)
Admission: RE | Admit: 2019-09-16 | Discharge: 2019-09-16 | Disposition: A | Discharge status: Home / Self Care | Payer: Medicare HMO | Source: Ambulatory Visit | Attending: Family Medicine | Admitting: Family Medicine

## 2019-09-16 ENCOUNTER — Encounter: Payer: Self-pay | Admitting: Family Medicine

## 2019-09-16 ENCOUNTER — Other Ambulatory Visit: Payer: Self-pay

## 2019-09-16 ENCOUNTER — Ambulatory Visit (INDEPENDENT_AMBULATORY_CARE_PROVIDER_SITE_OTHER): Payer: Medicare HMO | Admitting: Family Medicine

## 2019-09-16 VITALS — BP 112/70 | HR 101 | Temp 98.2°F | Ht 64.0 in | Wt 110.3 lb

## 2019-09-16 DIAGNOSIS — J439 Emphysema, unspecified: Secondary | ICD-10-CM

## 2019-09-16 DIAGNOSIS — Z720 Tobacco use: Secondary | ICD-10-CM

## 2019-09-16 DIAGNOSIS — F331 Major depressive disorder, recurrent, moderate: Secondary | ICD-10-CM

## 2019-09-16 DIAGNOSIS — R05 Cough: Secondary | ICD-10-CM | POA: Diagnosis not present

## 2019-09-16 DIAGNOSIS — F5104 Psychophysiologic insomnia: Secondary | ICD-10-CM

## 2019-09-16 DIAGNOSIS — R42 Dizziness and giddiness: Secondary | ICD-10-CM | POA: Diagnosis not present

## 2019-09-16 DIAGNOSIS — D649 Anemia, unspecified: Secondary | ICD-10-CM

## 2019-09-16 DIAGNOSIS — F1011 Alcohol abuse, in remission: Secondary | ICD-10-CM

## 2019-09-16 DIAGNOSIS — E673 Hypervitaminosis D: Secondary | ICD-10-CM

## 2019-09-16 DIAGNOSIS — E538 Deficiency of other specified B group vitamins: Secondary | ICD-10-CM

## 2019-09-16 DIAGNOSIS — R251 Tremor, unspecified: Secondary | ICD-10-CM | POA: Diagnosis not present

## 2019-09-16 NOTE — Progress Notes (Signed)
This visit was conducted in person.  BP 112/70 (BP Location: Right Arm, Patient Position: Sitting, Cuff Size: Normal)   Pulse (!) 101   Temp 98.2 F (36.8 C) (Temporal)   Ht 5\' 4"  (1.626 m)   Wt 110 lb 5 oz (50 kg)   SpO2 95% Comment: 2L  BMI 18.94 kg/m    CC: hosp f/u visit Subjective:    Patient ID: Christine Serrano, female    DOB: 13-Jan-1961, 59 y.o.   MRN: [id number]  HPI: Christine Serrano is a 59 y.o. female presenting on 09/16/2019 for Hospitalization Follow-up (C/o itching and bruising all over. )   Recent hospitalization for dizziness and headache, records reviewed. Dizziness described as lightheadedness and possible presyncope. Blood alcohol level was 84 mg/dL on admission. CT head was negative for acute abnormality, and MRI of brain was negative for stroke. Dizziness improved. Seen by PT - rec outpatient PCP f/u. Unclear cause of dizziness. Started on thiamine, folate and MVI - rec abstaining from alcohol. Found to have low folic acid. Thiamine wasn't checked. HH not involved.   H/o alcohol abuse ([employer]) - quit this 4 yrs ago (10/2015). She states she drinks 4 oz margarita daily - sips on it throughout the day. Also drinks 3 cups coffee in am and 1-2 cups in the evening. Drinking 2-3 glasses of water/day. Drinks chocolate milk. Does eat meat daily. May try ensure or boost. Did not drink more alcohol than normal that day.   Hydroxyzine, tizanidine, trazodone and gabapentin were held on discharge.   Ongoing marked fatigue, feels sluggish, no energy since she's been home. Today started itching at R knee and hand. Still having trouble sleeping.   fyi - her roommate Nigel Sloop (I previously saw) has different insurance and has needed to transition care to [facility].   __________________________________________ Admit date: 09/10/2019  Discharge date: 09/12/2019   Recommendations for Outpatient Follow-up:  1. Follow up with PCP in 1 week with repeat CBC/BMP 2. Follow up in ED if  symptoms worsen or new appear  Home Health: No Equipment/Devices: None  Discharge Condition: Stable CODE STATUS: Full Diet recommendation: Regular.  Patient refused heart healthy diet during hospitalization     Relevant past medical, surgical, family and social history reviewed and updated as indicated. Interim medical history since our last visit reviewed. Allergies and medications reviewed and updated. Outpatient Medications Prior to Visit  Medication Sig Dispense Refill  . albuterol (VENTOLIN HFA) 108 (90 Base) MCG/ACT inhaler Inhale 2 puffs into the lungs every 6 (six) hours as needed for wheezing or shortness of breath. 54 g 1  . aspirin EC 81 MG tablet Take 81 mg by mouth daily.    Marland Kitchen buPROPion (WELLBUTRIN SR) 100 MG 12 hr tablet Take 1 tablet (100 mg total) by mouth daily. 90 tablet 1  . Calcium Ascorbate 500 MG TABS Take 1 tablet (500 mg total) by mouth daily.  0  . fluticasone (FLONASE) 50 MCG/ACT nasal spray Place 2 sprays into both nostrils daily. 48 g 3  . folic acid (FOLVITE) 1 MG tablet Take 1 tablet (1 mg total) by mouth daily. 30 tablet 0  . gentamicin cream (GARAMYCIN) 0.1 % Apply 1 application topically in the morning and at bedtime for 14 days. Apply topically twice a day on affected nail 15 g 1  . ipratropium-albuterol (DUONEB) 0.5-2.5 (3) MG/3ML SOLN Inhale 3 mLs into the lungs every 6 (six) hours as needed. 360 mL 3  . LINZESS 290  MCG CAPS capsule Take 1 capsule (290 mcg total) by mouth daily as needed (constipation). 30 capsule 3  . meclizine (ANTIVERT) 25 MG tablet Take 1 tablet (25 mg total) by mouth 3 (three) times daily as needed for dizziness. 30 tablet 0  . Multiple Vitamin (MULTIVITAMIN WITH MINERALS) TABS tablet Take 1 tablet by mouth daily.    . naproxen (NAPROSYN) 375 MG tablet TAKE 1 TABLET (375 MG TOTAL) BY MOUTH 2 (TWO) TIMES DAILY AS NEEDED FOR MODERATE PAIN. (Patient taking differently: Take 375 mg by mouth 2 (two) times daily as needed for mild pain. )  40 tablet 1  . omeprazole (PRILOSEC) 40 MG capsule TAKE 1 CAPSULE BY MOUTH DAILY. FOR THREE WEEKS THEN AS NEEDED. TAKE 30 MIN PRIOR TO LARGE MEAL (Patient taking differently: Take 40 mg by mouth as needed (30 MIN PRIOR TO LARGE MEAL). ) 90 capsule 1  . OXYGEN Inhale 3 L/min into the lungs continuous.    . sertraline (ZOLOFT) 100 MG tablet Take 1.5 tablets (150 mg total) by mouth daily. 135 tablet 3  . SYMBICORT 160-4.5 MCG/ACT inhaler Inhale 2 puffs into the lungs 2 (two) times daily. 1 Inhaler 0  . thiamine 100 MG tablet Take 1 tablet (100 mg total) by mouth daily. 30 tablet 0  . tiotropium (SPIRIVA) 18 MCG inhalation capsule Place 1 capsule (18 mcg total) into inhaler and inhale daily. 90 capsule 2  . vitamin B-12 (CYANOCOBALAMIN) 1000 MCG tablet Take 1,000 mcg by mouth daily.    . vitamin E 400 UNIT capsule Take 400 Units by mouth daily.     Marland Kitchen gabapentin (NEURONTIN) 100 MG capsule Take 1 capsule (100 mg total) by mouth 2 (two) times daily. 180 capsule 3   No facility-administered medications prior to visit.     Per HPI unless specifically indicated in ROS section below Review of Systems Objective:    BP 112/70 (BP Location: Right Arm, Patient Position: Sitting, Cuff Size: Normal)   Pulse (!) 101   Temp 98.2 F (36.8 C) (Temporal)   Ht 5\' 4"  (1.626 m)   Wt 110 lb 5 oz (50 kg)   SpO2 95% Comment: 2L  BMI 18.94 kg/m   Wt Readings from Last 3 Encounters:  09/16/19 110 lb 5 oz (50 kg)  09/10/19 112 lb (50.8 kg)  05/26/19 115 lb 7 oz (52.4 kg)    Physical Exam Vitals and nursing note reviewed.  Constitutional:      Appearance: Normal appearance. She is not ill-appearing.  Eyes:     Extraocular Movements: Extraocular movements intact.     Pupils: Pupils are equal, round, and reactive to light.  Neck:     Vascular: Carotid bruit (L sided) present.  Cardiovascular:     Rate and Rhythm: Normal rate and regular rhythm.     Pulses: Normal pulses.     Heart sounds: Normal heart  sounds. No murmur.  Pulmonary:     Effort: Pulmonary effort is normal. No respiratory distress.     Breath sounds: Normal breath sounds. No wheezing, rhonchi or rales.  Musculoskeletal:     Cervical back: Normal range of motion.     Right lower leg: No edema.     Left lower leg: No edema.  Skin:    General: Skin is warm and dry.     Findings: No rash.  Neurological:     Mental Status: She is alert.  Psychiatric:        Mood and Affect: Mood normal.  Behavior: Behavior normal.       Lab Results  Component Value Date   WBC 4.7 09/12/2019   HGB 11.1 (L) 09/12/2019   HCT 34.4 (L) 09/12/2019   MCV 88.4 09/12/2019   PLT 192 09/12/2019    Assessment & Plan:  This visit occurred during the SARS-CoV-2 public health emergency.  Safety protocols were in place, including screening questions prior to the visit, additional usage of staff PPE, and extensive cleaning of exam room while observing appropriate contact time as indicated for disinfecting solutions.   Problem List Items Addressed This Visit    Tobacco abuse    Continues smoking.  Encouraged cessation.  Discussed lung cancer screening CT and she is interested - will refer.       Relevant Orders   Ambulatory Referral for Lung Cancer Scre   Occasional tremors    Change gabapentin to PRN.       Moderate recurrent major depression (HCC)    Stable period on sertraline and wellbutrin - continue.       Hypocalcemia    Markedly low upon presentation to ER, unclear cause. Corrected calcium for low albumin = 7.5.  She should continue her caclium intake.       Hypervitaminosis D    Hypervitaminosis when she was taking 10k BID, now levels back to normal. Continue only on MVI for vit D intake.       Folate deficiency    Levels low at hospital, now on 1mg  daily, as well as thiamine in continued alcohol use. Continue.       Dizziness - Primary    Describes lightheadedness and possible presyncope. Anticipate  multifactorial dizziness - medications and alcohol contributing. Discussed her alcohol use. She doesn't feel this contributed but she had elevated alcohol level on admission (0.084). will stay off hydroxyzine for now. She had not used tizanidine recently. Ok to try 1/2 trazodone at night PRN sleep and change gabapentin to PRN tremor.       COPD (chronic obstructive pulmonary disease) (HCC)    Chronic. Oxygen dependent. Continues smoking. Encouraged cessation. Update CXR      Relevant Orders   DG Chest 2 View (Completed)   Ambulatory Referral for Lung Cancer Scre   Chronic insomnia    Reviewed caffeine use, advised to stop drinking at night.  Encouraged she use trazodone 1/2 tab PRN sleep.       Anemia    Chronic, mild. ?nutritional. Found to have low folate levels - now on replacement along with thiamine in EtOH use. Other vitamin/iron levels were stable. Continue to monitor this.       Alcohol abuse, in remission    Continues drinking (4oz margarita/day). Continues abstaining from liquor.  Encourage full abstinence.          No orders of the defined types were placed in this encounter.  Orders Placed This Encounter  Procedures  . DG Chest 2 View    Standing Status:   Future    Number of Occurrences:   1    Standing Expiration Date:   11/15/2020    Order Specific Question:   Reason for Exam (SYMPTOM  OR DIAGNOSIS REQUIRED)    Answer:   COPD, cough    Order Specific Question:   Is patient pregnant?    Answer:   No    Order Specific Question:   Preferred imaging location?    Answer:   [facility]    Order Specific Question:  Radiology Contrast Protocol - do NOT remove file path    Answer:   [email or web address]  . Ambulatory Referral for Lung Cancer Scre    Referral Priority:   Routine    Referral Type:   Consultation    Referral Reason:   Specialty Services Required    Number of Visits Requested:   1    Patient  Instructions  Chest xray today. We will set you up for lung cancer screening program - expect a call.  Stay off hydroxyzine for now. Ok to try 1/2 trazodone at night time for sleep. Ok to use gabapentin as needed.  Call to schedule mammogram at your convenience (last done 05/2018) Folate level was low - continue new vitamins started at the hospital.  Return as needed or in 2-3 months for follow up visit   Follow up plan: Return in about 3 months (around 12/16/2019), or if symptoms worsen or fail to improve.  Ria Bush, MD

## 2019-09-16 NOTE — Patient Instructions (Addendum)
Chest xray today. We will set you up for lung cancer screening program - expect a call.  Stay off hydroxyzine for now. Ok to try 1/2 trazodone at night time for sleep. Ok to use gabapentin as needed.  Call to schedule mammogram at your convenience (last done 05/2018) Folate level was low - continue new vitamins started at the hospital.  Return as needed or in 2-3 months for follow up visit

## 2019-09-17 DIAGNOSIS — E538 Deficiency of other specified B group vitamins: Secondary | ICD-10-CM | POA: Insufficient documentation

## 2019-09-17 NOTE — Assessment & Plan Note (Signed)
Markedly low upon presentation to ER, unclear cause. Corrected calcium for low albumin = 7.5.  She should continue her caclium intake.

## 2019-09-17 NOTE — Assessment & Plan Note (Signed)
Chronic. Oxygen dependent. Continues smoking. Encouraged cessation. Update CXR

## 2019-09-17 NOTE — Assessment & Plan Note (Addendum)
Chronic, mild. ?nutritional. Found to have low folate levels - now on replacement along with thiamine in EtOH use. Other vitamin/iron levels were stable. Continue to monitor this.

## 2019-09-17 NOTE — Assessment & Plan Note (Signed)
Change gabapentin to PRN.

## 2019-09-17 NOTE — Assessment & Plan Note (Signed)
Reviewed caffeine use, advised to stop drinking at night.  Encouraged she use trazodone 1/2 tab PRN sleep.

## 2019-09-17 NOTE — Assessment & Plan Note (Signed)
Stable period on sertraline and wellbutrin - continue.  ?

## 2019-09-17 NOTE — Assessment & Plan Note (Signed)
Describes lightheadedness and possible presyncope. Anticipate multifactorial dizziness - medications and alcohol contributing. Discussed her alcohol use. She doesn't feel this contributed but she had elevated alcohol level on admission (0.084). will stay off hydroxyzine for now. She had not used tizanidine recently. Ok to try 1/2 trazodone at night PRN sleep and change gabapentin to PRN tremor.

## 2019-09-17 NOTE — Assessment & Plan Note (Addendum)
Continues drinking (4oz margarita/day). Continues abstaining from liquor.  Encourage full abstinence.

## 2019-09-17 NOTE — Assessment & Plan Note (Addendum)
Levels low at hospital, now on 1mg  daily, as well as thiamine in continued alcohol use. Continue.

## 2019-09-17 NOTE — Assessment & Plan Note (Signed)
Hypervitaminosis when she was taking 10k BID, now levels back to normal. Continue only on MVI for vit D intake.

## 2019-09-17 NOTE — Assessment & Plan Note (Signed)
Continues smoking.  Encouraged cessation.  Discussed lung cancer screening CT and she is interested - will refer.

## 2019-09-20 ENCOUNTER — Telehealth: Payer: Self-pay | Admitting: *Deleted

## 2019-09-20 DIAGNOSIS — Z87891 Personal history of nicotine dependence: Secondary | ICD-10-CM

## 2019-09-20 NOTE — Telephone Encounter (Signed)
Received referral for low dose lung cancer screening CT scan. Message left at phone number listed in EMR for patient to call me back to facilitate scheduling scan.  

## 2019-09-21 NOTE — Telephone Encounter (Signed)
Left voicemail in attempt to schedule lung screening scan.  

## 2019-09-22 ENCOUNTER — Ambulatory Visit: Payer: Medicare HMO | Admitting: Primary Care

## 2019-09-23 NOTE — Telephone Encounter (Signed)
Left voicemail in attempt to schedule lung screening scan.  

## 2019-09-24 ENCOUNTER — Encounter: Payer: Self-pay | Admitting: *Deleted

## 2019-09-24 NOTE — Addendum Note (Signed)
Addended by: Lieutenant Diego on: 09/24/2019 02:06 PM   Modules accepted: Orders

## 2019-09-24 NOTE — Telephone Encounter (Signed)
Received referral for initial lung cancer screening scan. Contacted patient and obtained smoking history,(current 100 pack year) as well as answering questions related to screening process. Patient denies signs of lung cancer such as weight loss or hemoptysis. Patient denies comorbidity that would prevent curative treatment if lung cancer were found. Patient is scheduled for shared decision making visit and CT scan on 10/05/19 at 130pm.

## 2019-09-28 ENCOUNTER — Ambulatory Visit: Payer: Medicare HMO | Admitting: Primary Care

## 2019-09-28 ENCOUNTER — Ambulatory Visit: Payer: Medicare Other | Admitting: Podiatry

## 2019-10-01 ENCOUNTER — Other Ambulatory Visit: Payer: Self-pay | Admitting: Family Medicine

## 2019-10-04 ENCOUNTER — Ambulatory Visit: Payer: Medicare HMO | Admitting: Family Medicine

## 2019-10-05 ENCOUNTER — Encounter: Payer: Self-pay | Admitting: Oncology

## 2019-10-05 ENCOUNTER — Inpatient Hospital Stay: Payer: Medicare HMO | Admitting: Oncology

## 2019-10-05 ENCOUNTER — Ambulatory Visit: Payer: Medicare HMO

## 2019-10-07 DIAGNOSIS — J449 Chronic obstructive pulmonary disease, unspecified: Secondary | ICD-10-CM | POA: Diagnosis not present

## 2019-10-08 ENCOUNTER — Other Ambulatory Visit: Payer: Self-pay

## 2019-10-08 NOTE — Telephone Encounter (Signed)
Naproxen Last rx:  09/01/19, #40 Last OV:  09/16/19, hosp f/u Next OV:  10/11/19, discuss memory

## 2019-10-10 MED ORDER — NAPROXEN 375 MG PO TABS: 375.0000 mg | ORAL_TABLET | Freq: Two times a day (BID) | ORAL | 1 refills | Status: DC | PRN

## 2019-10-11 ENCOUNTER — Ambulatory Visit: Payer: Medicare HMO | Admitting: Family Medicine

## 2019-10-13 ENCOUNTER — Other Ambulatory Visit: Payer: Self-pay

## 2019-10-13 ENCOUNTER — Inpatient Hospital Stay: Payer: Medicare HMO | Attending: Oncology | Admitting: Hospice and Palliative Medicine

## 2019-10-13 ENCOUNTER — Ambulatory Visit
Admission: RE | Admit: 2019-10-13 | Discharge: 2019-10-13 | Disposition: A | Discharge status: Home / Self Care | Payer: Medicare HMO | Source: Ambulatory Visit | Attending: Oncology | Admitting: Oncology

## 2019-10-13 DIAGNOSIS — Z122 Encounter for screening for malignant neoplasm of respiratory organs: Secondary | ICD-10-CM

## 2019-10-13 DIAGNOSIS — Z87891 Personal history of nicotine dependence: Secondary | ICD-10-CM

## 2019-10-13 DIAGNOSIS — F17210 Nicotine dependence, cigarettes, uncomplicated: Secondary | ICD-10-CM | POA: Diagnosis not present

## 2019-10-13 NOTE — Progress Notes (Signed)
In accordance with CMS guidelines, patient has met eligibility criteria including age, absence of signs or symptoms of lung cancer.  Social History   Tobacco Use  . Smoking status: Current Every Day Smoker    Packs/day: 2.00    Years: 50.00    Pack years: 100.00    Types: Cigarettes  . Smokeless tobacco: Never Used  . Tobacco comment: 9/18 down to 6-7 per day, set quit date of 10/1  Substance Use Topics  . Alcohol use: Yes    Comment: occasional  . Drug use: No      A shared decision-making session was conducted prior to the performance of CT scan. This includes one or more decision aids, includes benefits and harms of screening, follow-up diagnostic testing, over-diagnosis, false positive rate, and total radiation exposure.   Counseling on the importance of adherence to annual lung cancer LDCT screening, impact of co-morbidities, and ability or willingness to undergo diagnosis and treatment is imperative for compliance of the program.   Counseling on the importance of continued smoking cessation for former smokers; the importance of smoking cessation for current smokers, and information about tobacco cessation interventions have been given to patient including [place] and [employer] quit [place] programs.   Written order for lung cancer screening with LDCT has been given to the patient and any and all questions have been answered to the best of my abilities.    Yearly follow up will be coordinated by Burgess Estelle, Thoracic Navigator.  Time Total: 15 minutes  Visit consisted of counseling and education dealing with complex health screening. Greater than 50%  of this time was spent counseling and coordinating care related to the above assessment and plan.  Signed by: Altha Harm, PhD, NP-C [phone] (Work Cell)

## 2019-10-15 ENCOUNTER — Ambulatory Visit (INDEPENDENT_AMBULATORY_CARE_PROVIDER_SITE_OTHER): Payer: Medicare HMO | Admitting: Family Medicine

## 2019-10-15 ENCOUNTER — Encounter: Payer: Self-pay | Admitting: Family Medicine

## 2019-10-15 ENCOUNTER — Other Ambulatory Visit: Payer: Self-pay

## 2019-10-15 VITALS — BP 102/74 | HR 110 | Temp 97.8°F | Resp 24 | Ht 64.0 in | Wt 111.4 lb

## 2019-10-15 DIAGNOSIS — F5104 Psychophysiologic insomnia: Secondary | ICD-10-CM | POA: Diagnosis not present

## 2019-10-15 DIAGNOSIS — R251 Tremor, unspecified: Secondary | ICD-10-CM | POA: Diagnosis not present

## 2019-10-15 DIAGNOSIS — F331 Major depressive disorder, recurrent, moderate: Secondary | ICD-10-CM | POA: Diagnosis not present

## 2019-10-15 DIAGNOSIS — J441 Chronic obstructive pulmonary disease with (acute) exacerbation: Secondary | ICD-10-CM

## 2019-10-15 DIAGNOSIS — W57XXXA Bitten or stung by nonvenomous insect and other nonvenomous arthropods, initial encounter: Secondary | ICD-10-CM

## 2019-10-15 DIAGNOSIS — R000 Tachycardia, unspecified: Secondary | ICD-10-CM | POA: Diagnosis not present

## 2019-10-15 DIAGNOSIS — R413 Other amnesia: Secondary | ICD-10-CM | POA: Diagnosis not present

## 2019-10-15 DIAGNOSIS — I700 Atherosclerosis of aorta: Secondary | ICD-10-CM | POA: Diagnosis not present

## 2019-10-15 DIAGNOSIS — J432 Centrilobular emphysema: Secondary | ICD-10-CM | POA: Diagnosis not present

## 2019-10-15 DIAGNOSIS — R911 Solitary pulmonary nodule: Secondary | ICD-10-CM | POA: Diagnosis not present

## 2019-10-15 DIAGNOSIS — Z720 Tobacco use: Secondary | ICD-10-CM

## 2019-10-15 MED ORDER — DOXYCYCLINE HYCLATE 100 MG PO TABS: 100.0000 mg | ORAL_TABLET | Freq: Two times a day (BID) | ORAL | 0 refills | Status: DC

## 2019-10-15 MED ORDER — FOLIC ACID 1 MG PO TABS: 1.0000 mg | ORAL_TABLET | Freq: Every day | ORAL | 3 refills | Status: DC

## 2019-10-15 MED ORDER — GABAPENTIN 100 MG PO CAPS: 100.0000 mg | ORAL_CAPSULE | Freq: Two times a day (BID) | ORAL | 1 refills | Status: DC

## 2019-10-15 MED ORDER — PREDNISONE 20 MG PO TABS: ORAL_TABLET | ORAL | 0 refills | Status: DC

## 2019-10-15 NOTE — Progress Notes (Signed)
This visit was conducted in person.  BP 102/74   Pulse (!) 110   Temp 97.8 F (36.6 C)   Resp (!) 24   Ht 5\' 4"  (1.626 m)   Wt 111 lb 7 oz (50.5 kg)   SpO2 96% Comment: on 4 liters of O2  BMI 19.13 kg/m    CC: memory assessment Subjective:    Patient ID: Christine Serrano, female    DOB: 1961-01-18, 59 y.o.   MRN: [id number]  HPI: Christine Serrano is a 59 y.o. female presenting on 10/15/2019 for Follow-up (on CT result, COPD and memory discussion.) and Insect Bite (not sure if it was an insect bite, left upper leg-noticed yesterday 10/14/19)   Here with partner Levada Dy.  Levada Dy notes repetitive questioning worse when she wakes up. She does sleep with oxygen. She does talk in her sleep all night long.   Known severe oxygen dependent COPD (4L) - continued smoker. On max dose symbicort, flonase, spiriva. Using albuterol 4+ times a day - doubling up on dose of neb.  [place] after Dr Alva Garnet has left - needs to establish.  Notes increased coughing, increased sputum productive, increased dyspnea - unsure how long.   She restarted gabapentin and trazodone for sleep.   2 bug bites to inner L thigh. Unsure what bit her.  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   Recent lung cancer screening CT IMPRESSION: 1. Lung-RADS 3s, probably benign findings. Short-term follow-up in 6 months is recommended with repeat low-dose chest CT without contrast (please use the following order, "CT CHEST LCS NODULE FOLLOW-UP /O CM"). 2. Diffuse bronchial wall thickening with emphysema, as above; imaging findings suggestive of underlying COPD. 3. The S modifier above refers to extensive bilateral peripheral and upper lobe predominant scarring and architectural distortion. Although similar to 11/25/2016. This chronic finding may diminish sensitivity for small pulmonary nodules. 4. Multi vessel coronary artery calcifications. 5. Emphysema and aortic atherosclerosis. Aortic  Atherosclerosis (ICD10-I70.0) and Emphysema (ICD10-J43.9). Electronically Signed   By: Kerby Moors M.D.   On: 10/13/2019 15:17  Reviewed results with patient and partner.  Denies significant chest pain or exertional dyspnea outside chronic dyspnea from lungs.   Requests mobility assessment for scooter evaluation. Will return for this. Told [employer] would go through local DME store.   Geriatric Assessment: Activities of Daily Living:     Bathing- partially dependent    Dressing- independent    Eating- independent    Toileting- independent    Transferring- independent    Continence- independent Overall Assessment: independent  Instrumental Activities of Daily Living:     Transportation- dependent    Meal/Food Preparation- independent    Shopping Errands- dependent    Housekeeping/Chores- dependent    Money Management/Finances- dependent    Medication Management- independent    Ability to Use Telephone- independent    Laundry- independent Overall Assessment:  Partially dependent  Mental Status Exam: 29/30 (value/max value)   Clock Drawing Score: 4/4       Relevant past medical, surgical, family and social history reviewed and updated as indicated. Interim medical history since our last visit reviewed. Allergies and medications reviewed and updated. Outpatient Medications Prior to Visit  Medication Sig Dispense Refill  . albuterol (VENTOLIN HFA) 108 (90 Base) MCG/ACT inhaler Inhale 2 puffs into the lungs every 6 (six) hours as needed for wheezing or shortness of breath. 54 g 1  . aspirin EC 81 MG tablet  Take 81 mg by mouth daily.    Marland Kitchen buPROPion (WELLBUTRIN SR) 100 MG 12 hr tablet Take 1 tablet (100 mg total) by mouth daily. 90 tablet 1  . Calcium Ascorbate 500 MG TABS Take 1 tablet (500 mg total) by mouth daily.  0  . fluticasone (FLONASE) 50 MCG/ACT nasal spray Place 2 sprays into both nostrils daily. 48 g 3  . hydrOXYzine (ATARAX/VISTARIL) 25 MG tablet TAKE 1 TABLET (25  MG TOTAL) BY MOUTH 2 (TWO) TIMES DAILY AS NEEDED FOR ANXIETY. 180 tablet 0  . ipratropium-albuterol (DUONEB) 0.5-2.5 (3) MG/3ML SOLN Inhale 3 mLs into the lungs every 6 (six) hours as needed. 360 mL 3  . LINZESS 290 MCG CAPS capsule Take 1 capsule (290 mcg total) by mouth daily as needed (constipation). 30 capsule 3  . meclizine (ANTIVERT) 25 MG tablet Take 1 tablet (25 mg total) by mouth 3 (three) times daily as needed for dizziness. 30 tablet 0  . Multiple Vitamin (MULTIVITAMIN WITH MINERALS) TABS tablet Take 1 tablet by mouth daily.    . naproxen (NAPROSYN) 375 MG tablet Take 1 tablet (375 mg total) by mouth 2 (two) times daily as needed for moderate pain. 40 tablet 1  . omeprazole (PRILOSEC) 40 MG capsule TAKE 1 CAPSULE BY MOUTH DAILY. FOR THREE WEEKS THEN AS NEEDED. TAKE 30 MIN PRIOR TO LARGE MEAL (Patient taking differently: Take 40 mg by mouth as needed (30 MIN PRIOR TO LARGE MEAL). ) 90 capsule 1  . OXYGEN Inhale 3 L/min into the lungs continuous.    . sertraline (ZOLOFT) 100 MG tablet Take 1.5 tablets (150 mg total) by mouth daily. 135 tablet 3  . SYMBICORT 160-4.5 MCG/ACT inhaler Inhale 2 puffs into the lungs 2 (two) times daily. 1 Inhaler 0  . thiamine 100 MG tablet Take 1 tablet (100 mg total) by mouth daily. 30 tablet 0  . tiotropium (SPIRIVA) 18 MCG inhalation capsule Place 1 capsule (18 mcg total) into inhaler and inhale daily. 90 capsule 2  . traZODone (DESYREL) 50 MG tablet Take 25 tablets by mouth at bedtime.    . vitamin B-12 (CYANOCOBALAMIN) 1000 MCG tablet Take 1,000 mcg by mouth daily.    . vitamin E 400 UNIT capsule Take 400 Units by mouth daily.     . folic acid (FOLVITE) 1 MG tablet Take 1 tablet (1 mg total) by mouth daily. 30 tablet 0  . gabapentin (NEURONTIN) 100 MG capsule Take 1 capsule by mouth daily.     No facility-administered medications prior to visit.     Per HPI unless specifically indicated in ROS section below Review of Systems Objective:  BP 102/74    Pulse (!) 110   Temp 97.8 F (36.6 C)   Resp (!) 24   Ht 5\' 4"  (1.626 m)   Wt 111 lb 7 oz (50.5 kg)   SpO2 96% Comment: on 4 liters of O2  BMI 19.13 kg/m   Wt Readings from Last 3 Encounters:  10/16/19 111 lb (50.3 kg)  10/15/19 111 lb 7 oz (50.5 kg)  10/13/19 110 lb (49.9 kg)      Physical Exam Vitals and nursing note reviewed.  Constitutional:      Appearance: Normal appearance. She is underweight. She is ill-appearing.     Comments: 4L O2 by [place]  HENT:     Mouth/Throat:     Mouth: Mucous membranes are moist.     Pharynx: Oropharynx is clear. No oropharyngeal exudate or posterior oropharyngeal erythema.  Cardiovascular:     Rate and Rhythm: Regular rhythm. Tachycardia present.     Pulses: Normal pulses.     Heart sounds: Normal heart sounds. No murmur.  Pulmonary:     Effort: No respiratory distress.     Breath sounds: Wheezing (coarse throughout) and rhonchi (diffuse) present. No rales.  Musculoskeletal:     Right lower leg: No edema.     Left lower leg: No edema.  Skin:    General: Skin is warm and dry.     Findings: Erythema and rash present.     Comments: 2 bug bites inner left thigh with surrounding erythema with central clearing  Neurological:     Mental Status: She is alert.  Psychiatric:     Comments: Tearful with discussion of selling horses and functional limitations        Assessment & Plan:  This visit occurred during the SARS-CoV-2 public health emergency.  Safety protocols were in place, including screening questions prior to the visit, additional usage of staff PPE, and extensive cleaning of exam room while observing appropriate contact time as indicated for disinfecting solutions.   Problem List Items Addressed This Visit    Tobacco abuse    Continued to encourage cessation. At this time doesn't feel able to quit.       Tachycardia   Pulmonary nodule    Small sized on latest lung cancer screening (this week). Planned Q67mo f/u.        Occasional tremors    Markedly worse recently - has restarted gabapentin 100mg  - will increase to BID dosing.       Moderate recurrent major depression ([place])    Difficult time coming to terms with progressive physical limitations from severe COPD - continue wellbutrin and sertraline. 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       Relevant Medications   traZODone (DESYREL) 50 MG tablet   Memory difficulties - Primary    Stable testing today. Anticipate noted disorientation when she wakes up more due to hypoxia than dementia related memory changes.      COPD exacerbation ([place])    Meets criteria for acute exacerbation, unsure duration.  Will treat with prednisone and doxycycline course.  Advised re establish with pulm as overdue.       Relevant Medications   predniSONE (DELTASONE) 20 MG tablet   COPD (chronic obstructive pulmonary disease) (HCC)    Chronic, severe, oxygen dependent, doesn't think she will be able to quit smoking.  Reviewed CT findings of significant scarring as well as current symptoms of COPD exacerbation - see below.       Relevant Medications   predniSONE (DELTASONE) 20 MG tablet   Chronic insomnia    Has restarted trazodone 1/2-1 tab at night.       Bug bite    To L thigh, with ?bullseye rash around bite - will cover with doxycycline. No other tick borne illness symptoms.       Aortic atherosclerosis (HCC)    Continues aspirin.           Meds ordered this encounter  Medications  . folic acid (FOLVITE) 1 MG tablet    Sig: Take 1 tablet (1 mg total) by mouth daily.    Dispense:  90 tablet    Refill:  3  . gabapentin (NEURONTIN) 100 MG capsule    Sig: Take 1 capsule (100 mg total) by mouth 2 (two) times daily.    Dispense:  180 capsule  Refill:  1  . doxycycline (VIBRA-TABS) 100 MG tablet    Sig: Take 1 tablet (100 mg total) by mouth 2 (two) times daily.    Dispense:  14 tablet    Refill:  0  . predniSONE (DELTASONE) 20 MG tablet    Sig: Take  two tablets daily for 3 days followed by one tablet daily for 4 days    Dispense:  10 tablet    Refill:  0   No orders of the defined types were placed in this encounter.  Patient Instructions  Memory testing was overall ok.  Return for mobility assessment (30 min visit) I'd like you to take doxycycline and prednisone taper for worsening COPD.  Call pulmonology for follow up.    Follow up plan: Return if symptoms worsen or fail to improve.  Ria Bush, MD

## 2019-10-15 NOTE — Patient Instructions (Addendum)
Memory testing was overall ok.  Return for mobility assessment (30 min visit) I'd like you to take doxycycline and prednisone taper for worsening COPD.  Call pulmonology for follow up.

## 2019-10-16 ENCOUNTER — Encounter: Payer: Self-pay | Admitting: Family Medicine

## 2019-10-16 ENCOUNTER — Encounter: Payer: Self-pay | Admitting: Emergency Medicine

## 2019-10-16 ENCOUNTER — Emergency Department
Admission: EM | Admit: 2019-10-16 | Discharge: 2019-10-16 | Disposition: A | Discharge status: Home / Self Care | Payer: Medicare HMO | Attending: Emergency Medicine | Admitting: Emergency Medicine

## 2019-10-16 ENCOUNTER — Other Ambulatory Visit: Payer: Self-pay

## 2019-10-16 ENCOUNTER — Emergency Department: Payer: Medicare HMO

## 2019-10-16 DIAGNOSIS — Z743 Need for continuous supervision: Secondary | ICD-10-CM | POA: Diagnosis not present

## 2019-10-16 DIAGNOSIS — Z79899 Other long term (current) drug therapy: Secondary | ICD-10-CM | POA: Insufficient documentation

## 2019-10-16 DIAGNOSIS — W57XXXA Bitten or stung by nonvenomous insect and other nonvenomous arthropods, initial encounter: Secondary | ICD-10-CM | POA: Insufficient documentation

## 2019-10-16 DIAGNOSIS — R05 Cough: Secondary | ICD-10-CM | POA: Diagnosis not present

## 2019-10-16 DIAGNOSIS — Z7982 Long term (current) use of aspirin: Secondary | ICD-10-CM | POA: Diagnosis not present

## 2019-10-16 DIAGNOSIS — R0602 Shortness of breath: Secondary | ICD-10-CM

## 2019-10-16 DIAGNOSIS — F17210 Nicotine dependence, cigarettes, uncomplicated: Secondary | ICD-10-CM | POA: Insufficient documentation

## 2019-10-16 DIAGNOSIS — I959 Hypotension, unspecified: Secondary | ICD-10-CM | POA: Diagnosis not present

## 2019-10-16 DIAGNOSIS — J441 Chronic obstructive pulmonary disease with (acute) exacerbation: Secondary | ICD-10-CM | POA: Diagnosis not present

## 2019-10-16 DIAGNOSIS — R413 Other amnesia: Secondary | ICD-10-CM | POA: Insufficient documentation

## 2019-10-16 DIAGNOSIS — R000 Tachycardia, unspecified: Secondary | ICD-10-CM | POA: Diagnosis not present

## 2019-10-16 LAB — CBC WITH DIFFERENTIAL/PLATELET
Abs Immature Granulocytes: 0.03 10*3/uL (ref 0.00–0.07)
Basophils Absolute: 0.1 10*3/uL (ref 0.0–0.1)
Basophils Relative: 1 %
Eosinophils Absolute: 0.2 10*3/uL (ref 0.0–0.5)
Eosinophils Relative: 2 %
HCT: 37.3 % (ref 36.0–46.0)
Hemoglobin: 12.3 g/dL (ref 12.0–15.0)
Immature Granulocytes: 0 %
Lymphocytes Relative: 44 %
Lymphs Abs: 4 10*3/uL (ref 0.7–4.0)
MCH: 28.7 pg (ref 26.0–34.0)
MCHC: 33 g/dL (ref 30.0–36.0)
MCV: 86.9 fL (ref 80.0–100.0)
Monocytes Absolute: 0.6 10*3/uL (ref 0.1–1.0)
Monocytes Relative: 7 %
Neutro Abs: 4.3 10*3/uL (ref 1.7–7.7)
Neutrophils Relative %: 46 %
Platelets: 190 10*3/uL (ref 150–400)
RBC: 4.29 MIL/uL (ref 3.87–5.11)
RDW: 14 % (ref 11.5–15.5)
WBC: 9.1 10*3/uL (ref 4.0–10.5)
nRBC: 0 % (ref 0.0–0.2)

## 2019-10-16 LAB — TROPONIN I (HIGH SENSITIVITY): Troponin I (High Sensitivity): 3 ng/L (ref <18)

## 2019-10-16 LAB — BASIC METABOLIC PANEL
Anion gap: 8 (ref 5–15)
BUN: 6 mg/dL (ref 6–20)
CO2: 29 mmol/L (ref 22–32)
Calcium: 8.8 mg/dL — ABNORMAL LOW (ref 8.9–10.3)
Chloride: 99 mmol/L (ref 98–111)
Creatinine, Ser: 0.57 mg/dL (ref 0.44–1.00)
GFR calc Af Amer: >60 mL/min (ref >60)
GFR calc non Af Amer: >60 mL/min (ref >60)
Glucose, Bld: 94 mg/dL (ref 70–99)
Potassium: 3.6 mmol/L (ref 3.5–5.1)
Sodium: 136 mmol/L (ref 135–145)

## 2019-10-16 MED ORDER — IPRATROPIUM-ALBUTEROL 0.5-2.5 (3) MG/3ML IN SOLN
3.0000 mL | Freq: Once | RESPIRATORY_TRACT | Status: AC
  Administered 2019-10-16: 3 mL via RESPIRATORY_TRACT
  Filled 2019-10-16: qty 3

## 2019-10-16 MED ORDER — ALBUTEROL SULFATE (2.5 MG/3ML) 0.083% IN NEBU
2.5000 mg | INHALATION_SOLUTION | Freq: Once | RESPIRATORY_TRACT | Status: AC
  Administered 2019-10-16: 2.5 mg via RESPIRATORY_TRACT
  Filled 2019-10-16: qty 3

## 2019-10-16 NOTE — ED Provider Notes (Addendum)
[facility] Emergency Department Provider Note   ____________________________________________   First MD Initiated Contact with Patient 10/16/19 0250     (approximate)  I have reviewed the triage vital signs and the nursing notes.   HISTORY  Chief Complaint COPD    HPI Christine Serrano is a 59 y.o. female with possible history of COPD on 4 L nasal cannula, hyperlipidemia, endometriosis, and cervical cancer who presents to the ED complaining of shortness of breath.  Patient reports that she has had increased cough with greenish sputum production over about the past week.  This is been associated with increasing difficulty breathing, but she denies any fevers or chest pain.  She has been using her breathing treatments at home with only minimal relief.  She saw her PCP for this problem yesterday, who started her on steroids as well as doxycycline.  Her breathing has continued to worsen throughout the night and so she called EMS.        Past Medical History:  Diagnosis Date  . Cervical cancer ([place])   . Chronic idiopathic constipation   . Community acquired pneumonia of right upper lobe of lung 12/10/2016  . COPD (chronic obstructive pulmonary disease) ([place])   . Depression   . Endometriosis   . HLD (hyperlipidemia)   . Smoker     Patient Active Problem List   Diagnosis Date Noted  . Folate deficiency 09/17/2019  . Dizziness 09/11/2019  . Anemia 09/11/2019  . Hypocalcemia 09/11/2019  . Carotid stenosis, asymptomatic, left 04/06/2019  . Systolic murmur [id number]  . Left carotid bruit 02/27/2019  . Hypervitaminosis D 02/27/2019  . Medicare annual wellness visit, subsequent 02/25/2019  . Advanced directives, counseling/discussion 02/25/2019  . Epigastric abdominal pain 09/21/2018  . Epistaxis 09/21/2018  . COPD exacerbation ([place]) 09/02/2018  . Osteoporosis 05/23/2018  . Numbness of right hand 10/10/2017  . Nasal sinus congestion 10/10/2017  .  Benign neoplasm of ascending colon   . Benign neoplasm of transverse colon   . Benign neoplasm of sigmoid colon   . Positive hepatitis C antibody test 02/23/2017  . Health maintenance examination 02/21/2017  . HLD (hyperlipidemia) 02/21/2017  . Anxiety 12/10/2016  . Tobacco abuse 11/28/2016  . Aortic atherosclerosis ([place]) 11/26/2016  . Tachycardia 11/01/2016  . Pulmonary nodule 10/25/2016  . Moderate recurrent major depression ([place]) 07/03/2016  . Chronic constipation 07/03/2016  . Alcohol abuse, in remission 07/03/2016  . COPD (chronic obstructive pulmonary disease) ([place]) 07/03/2016  . Occasional tremors 07/03/2016  . Chronic insomnia 07/03/2016    Past Surgical History:  Procedure Laterality Date  . ABLATION ON ENDOMETRIOSIS    . APPENDECTOMY    . BREAST EXCISIONAL BIOPSY Left age 38   benign biopsy - chronic fatty deposit  . COLONOSCOPY WITH PROPOFOL N/A 08/04/2017   TAx3, angiodysplastic lesion, diverticulosis, rpt 5 yrs ([place])  . EXPLORATORY LAPAROTOMY  1992   endometriosis  . FOOT SURGERY Right    needle imbeded  . LASER ABLATION CONDYLOMA CERVICAL / VULVAR    . NASAL SEPTUM SURGERY Bilateral   . OVARIAN CYST SURGERY Bilateral 1980   remote  . SALPINGOOPHORECTOMY Right 1983   ectopic pregnancy  . TONSILLECTOMY      Prior to Admission medications   Medication Sig Start Date End Date Taking? Authorizing Provider  albuterol (VENTOLIN HFA) 108 (90 Base) MCG/ACT inhaler Inhale 2 puffs into the lungs every 6 (six) hours as needed for wheezing or shortness of breath. 07/26/19   Ria Bush, MD  aspirin EC 81 MG tablet Take 81 mg by mouth daily.    [provider]  buPROPion (WELLBUTRIN SR) 100 MG 12 hr tablet Take 1 tablet (100 mg total) by mouth daily. 07/12/19   Ria Bush, MD  Calcium Ascorbate 500 MG TABS Take 1 tablet (500 mg total) by mouth daily. 07/01/19   Ria Bush, MD  doxycycline (VIBRA-TABS) 100 MG tablet Take 1 tablet (100 mg  total) by mouth 2 (two) times daily. 10/15/19   Ria Bush, MD  fluticasone [facility]) 50 MCG/ACT nasal spray Place 2 sprays into both nostrils daily. 07/12/19   Ria Bush, MD  folic acid (FOLVITE) 1 MG tablet Take 1 tablet (1 mg total) by mouth daily. 10/15/19   Ria Bush, MD  gabapentin (NEURONTIN) 100 MG capsule Take 1 capsule (100 mg total) by mouth 2 (two) times daily. 10/15/19   Ria Bush, MD  hydrOXYzine (ATARAX/VISTARIL) 25 MG tablet TAKE 1 TABLET (25 MG TOTAL) BY MOUTH 2 (TWO) TIMES DAILY AS NEEDED FOR ANXIETY. 10/02/19   Ria Bush, MD  ipratropium-albuterol (DUONEB) 0.5-2.5 (3) MG/3ML SOLN Inhale 3 mLs into the lungs every 6 (six) hours as needed. 07/13/19   Ria Bush, MD  LINZESS 290 MCG CAPS capsule Take 1 capsule (290 mcg total) by mouth daily as needed (constipation). 07/13/19   Ria Bush, MD  meclizine (ANTIVERT) 25 MG tablet Take 1 tablet (25 mg total) by mouth 3 (three) times daily as needed for dizziness. 09/12/19   Aline August, MD  Multiple Vitamin (MULTIVITAMIN WITH MINERALS) TABS tablet Take 1 tablet by mouth daily. 09/12/19   Aline August, MD  naproxen (NAPROSYN) 375 MG tablet Take 1 tablet (375 mg total) by mouth 2 (two) times daily as needed for moderate pain. 10/10/19   Ria Bush, MD  omeprazole (PRILOSEC) 40 MG capsule TAKE 1 CAPSULE BY MOUTH DAILY. FOR THREE WEEKS THEN AS NEEDED. TAKE 30 MIN PRIOR TO LARGE MEAL Patient taking differently: Take 40 mg by mouth as needed (30 MIN PRIOR TO LARGE MEAL).  07/12/19   Ria Bush, MD  OXYGEN Inhale 3 L/min into the lungs continuous.    [provider]  predniSONE (DELTASONE) 20 MG tablet Take two tablets daily for 3 days followed by one tablet daily for 4 days 10/15/19   Ria Bush, MD  sertraline (ZOLOFT) 100 MG tablet Take 1.5 tablets (150 mg total) by mouth daily. 07/19/19   Ria Bush, MD  SYMBICORT 160-4.5 MCG/ACT inhaler Inhale 2 puffs into the  lungs 2 (two) times daily. 07/20/19   Ria Bush, MD  thiamine 100 MG tablet Take 1 tablet (100 mg total) by mouth daily. 09/12/19   Aline August, MD  tiotropium (SPIRIVA) 18 MCG inhalation capsule Place 1 capsule (18 mcg total) into inhaler and inhale daily. 07/12/19   Ria Bush, MD  traZODone (DESYREL) 50 MG tablet Take 25 tablets by mouth at bedtime. 10/01/19   [provider]  vitamin B-12 (CYANOCOBALAMIN) 1000 MCG tablet Take 1,000 mcg by mouth daily.    [provider]  vitamin E 400 UNIT capsule Take 400 Units by mouth daily.     [provider]    Allergies Terbinafine and related and Imitrex [sumatriptan]  Family History  Problem Relation Age of Onset  . Heart disease Mother   . Hypertension Mother   . Hypertension Father   . COPD Father   . Hearing loss Maternal Grandmother   . Hypertension Maternal Grandmother   . Hearing loss Maternal Grandfather   .  Hypertension Maternal Grandfather   . Colon cancer Paternal Grandmother     Social History Social History   Tobacco Use  . Smoking status: Current Every Day Smoker    Packs/day: 0.50    Years: 50.00    Pack years: 25.00    Types: Cigarettes  . Smokeless tobacco: Never Used  . Tobacco comment: 9/18 down to 6-7 per day, set quit date of 10/1  Substance Use Topics  . Alcohol use: Yes    Comment: occasional  . Drug use: No    Review of Systems  Constitutional: No fever/chills Eyes: No visual changes. ENT: No sore throat. Cardiovascular: Denies chest pain. Respiratory: Positive for cough and shortness of breath. Gastrointestinal: No abdominal pain.  No nausea, no vomiting.  No diarrhea.  No constipation. Genitourinary: Negative for dysuria. Musculoskeletal: Negative for back pain. Skin: Negative for rash. Neurological: Negative for headaches, focal weakness or numbness.  ____________________________________________   PHYSICAL EXAM:  VITAL SIGNS: ED Triage Vitals   Enc Vitals Group     BP --      Pulse --      Resp --      Temp --      Temp src --      SpO2 10/16/19 0251 100 %     Weight --      Height --      Head Circumference --      Peak Flow --      Pain Score 10/16/19 0252 0     Pain Loc --      Pain Edu? --      Excl. in [place]? --     Constitutional: Alert and oriented. Eyes: Conjunctivae are normal. Head: Atraumatic. Nose: No congestion/rhinnorhea. Mouth/Throat: Mucous membranes are moist. Neck: Normal ROM Cardiovascular: Normal rate, regular rhythm. Grossly normal heart sounds. Respiratory: Tachypnea with mildly increased respiratory effort.  No retractions.  Inspiratory and expiratory wheezing throughout. Gastrointestinal: Soft and nontender. No distention. Genitourinary: deferred Musculoskeletal: No lower extremity tenderness nor edema. Neurologic:  Normal speech and language. No gross focal neurologic deficits are appreciated. Skin:  Skin is warm, dry and intact. No rash noted. Psychiatric: Mood and affect are normal. Speech and behavior are normal.  ____________________________________________   LABS (all labs ordered are listed, but only abnormal results are displayed)  Labs Reviewed  BASIC METABOLIC PANEL - Abnormal; Notable for the following components:      Result Value   Calcium 8.8 (*)    All other components within normal limits  CBC WITH DIFFERENTIAL/PLATELET  TROPONIN I (HIGH SENSITIVITY)   ____________________________________________  EKG  ED ECG REPORT I, Blake Divine, the attending physician, personally viewed and interpreted this ECG.   Date: 10/16/2019  EKG Time: 2:53  Rate: 110  Rhythm: sinus tachycardia  Axis: RAD  Intervals:none  ST&T Change: None   PROCEDURES  Procedure(s) performed (including Critical Care):  Procedures   ____________________________________________   INITIAL IMPRESSION / ASSESSMENT AND PLAN / ED COURSE       59 year old female presents to the ED with  increasing productive cough as well as difficulty breathing over the past week, has not responded to steroids or antibiotics prescribed by her PCP yesterday.  She is not in any respiratory distress but slightly tachypneic and tachycardic with wheezing throughout.  She was given 2 DuoNeb's as well as 125 mg of Solu-Medrol by EMS.  We will give additional breathing treatment, check chest x-ray and labs.  I have low suspicion for ACS  or PE, EKG without acute ischemic changes.  Chest x-ray is negative for acute process and lab work is unremarkable, troponin within normal limits.  Patient's breathing is much improved following 2 additional breathing treatments, wheezing has now resolved.  While she remains tachycardic, her tachypnea has now resolved and tachycardia is likely related to multiple breathing treatments.  She has been able to ambulate to the bathroom without difficulty and is now appropriate for discharge home.  She was previously prescribed prednisone taper by her PCP as well as doxycycline, was counseled to continue these medications and closely follow-up with her PCP.  She was also counseled to return to the ED for new or worsening symptoms, patient agrees with plan.      ____________________________________________   FINAL CLINICAL IMPRESSION(S) / ED DIAGNOSES  Final diagnoses:  COPD exacerbation ([place])  Shortness of breath     ED Discharge Orders    None       Note:  This document was prepared using Dragon voice recognition software and may include unintentional dictation errors.   Blake Divine, MD 10/16/19 ID:[id number]    Blake Divine, MD 10/16/19 [phone]

## 2019-10-16 NOTE — Assessment & Plan Note (Signed)
Stable testing today. Anticipate noted disorientation when she wakes up more due to hypoxia than dementia related memory changes.

## 2019-10-16 NOTE — Assessment & Plan Note (Addendum)
Continued to encourage cessation. At this time doesn't feel able to quit.

## 2019-10-16 NOTE — Assessment & Plan Note (Signed)
To L thigh, with ?bullseye rash around bite - will cover with doxycycline. No other tick borne illness symptoms.

## 2019-10-16 NOTE — Assessment & Plan Note (Signed)
Meets criteria for acute exacerbation, unsure duration.  Will treat with prednisone and doxycycline course.  Advised re establish with pulm as overdue.

## 2019-10-16 NOTE — Assessment & Plan Note (Signed)
Markedly worse recently - has restarted gabapentin 100mg  - will increase to BID dosing.

## 2019-10-16 NOTE — Assessment & Plan Note (Signed)
Small sized on latest lung cancer screening (this week). Planned Q84mo f/u.

## 2019-10-16 NOTE — Assessment & Plan Note (Signed)
Has restarted trazodone 1/2-1 tab at night.

## 2019-10-16 NOTE — ED Triage Notes (Signed)
Pt arrives via [facility] with c/o COPD exacerbation. Pt is chronically on 4L O2 and had a saturation of 98% for EMS. Pt was seen at her PCP today and was given prednisone and doxycycline. Pt has productive cough at this time and was given 125 solumedrol and 1 1/2 duoneb with EMS. Pt is in NAD.

## 2019-10-16 NOTE — Assessment & Plan Note (Addendum)
Difficult time coming to terms with progressive physical limitations from severe COPD - continue wellbutrin and sertraline. 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

## 2019-10-16 NOTE — Assessment & Plan Note (Signed)
Continues aspirin.

## 2019-10-16 NOTE — Assessment & Plan Note (Addendum)
Chronic, severe, oxygen dependent, doesn't think she will be able to quit smoking.  Reviewed CT findings of significant scarring as well as current symptoms of COPD exacerbation - see below.

## 2019-10-18 ENCOUNTER — Telehealth: Payer: Self-pay | Admitting: Adult Health Nurse Practitioner

## 2019-10-18 ENCOUNTER — Other Ambulatory Visit: Payer: Self-pay

## 2019-10-18 ENCOUNTER — Telehealth: Payer: Self-pay | Admitting: Pulmonary Disease

## 2019-10-18 ENCOUNTER — Encounter: Payer: Self-pay | Admitting: Pulmonary Disease

## 2019-10-18 ENCOUNTER — Ambulatory Visit: Payer: Medicare HMO | Admitting: Family Medicine

## 2019-10-18 ENCOUNTER — Ambulatory Visit (INDEPENDENT_AMBULATORY_CARE_PROVIDER_SITE_OTHER): Payer: Medicare HMO | Admitting: Pulmonary Disease

## 2019-10-18 VITALS — BP 108/58 | HR 116 | Temp 97.4°F | Ht 64.0 in | Wt 112.0 lb

## 2019-10-18 DIAGNOSIS — J441 Chronic obstructive pulmonary disease with (acute) exacerbation: Secondary | ICD-10-CM

## 2019-10-18 DIAGNOSIS — J449 Chronic obstructive pulmonary disease, unspecified: Secondary | ICD-10-CM

## 2019-10-18 DIAGNOSIS — F17210 Nicotine dependence, cigarettes, uncomplicated: Secondary | ICD-10-CM

## 2019-10-18 DIAGNOSIS — J9611 Chronic respiratory failure with hypoxia: Secondary | ICD-10-CM | POA: Diagnosis not present

## 2019-10-18 MED ORDER — BUDESONIDE 0.5 MG/2ML IN SUSP: 0.5000 mg | Freq: Two times a day (BID) | RESPIRATORY_TRACT | 11 refills | Status: DC

## 2019-10-18 MED ORDER — BROVANA 15 MCG/2ML IN NEBU: 15.0000 ug | INHALATION_SOLUTION | Freq: Two times a day (BID) | RESPIRATORY_TRACT | 11 refills | Status: DC

## 2019-10-18 MED ORDER — SPIRIVA RESPIMAT 2.5 MCG/ACT IN AERS: 2.0000 | INHALATION_SPRAY | Freq: Every day | RESPIRATORY_TRACT | 11 refills | Status: DC

## 2019-10-18 MED ORDER — SPIRIVA RESPIMAT 2.5 MCG/ACT IN AERS: 2.0000 | INHALATION_SPRAY | Freq: Every day | RESPIRATORY_TRACT | 0 refills | Status: DC

## 2019-10-18 NOTE — Telephone Encounter (Signed)
Medication name and strength:[place] 15MCG/2ML Provider: Patsey Berthold Pharmacy: [facility] Phone: [phone] Fax: [phone]  Was the PA started on CMM?  Yes If yes, please enter the Key: [facility] Timeframe for approval/denial: 24 - 72 hours

## 2019-10-18 NOTE — Telephone Encounter (Signed)
Called patient to schedule a Palliative Consult, no answer - left message with reason for call along with my name and contact number. 

## 2019-10-18 NOTE — Patient Instructions (Signed)
We are sending you for evaluation by Palliative Care.  We are switching you to nebulized medications.  This will mean that you will stop Symbicort.  New medications will be Brovana (arformoterol) and Pulmicort (budesonide) these will be via nebulizer twice a day.  I recommend that you use Brovana first and then follow with the Pulmicort so that the medications will not precipitate in the medication cup.  You can still use DuoNeb, ONLY AS NEEDED for breakthrough shortness of breath, in between [place] and Pulmicort 2-3 times a day.  Cessation of smoking is still recommended as this will help your medications to work better.  We are going to switch your Spiriva from the capsule form to the inhaler form.  This will hopefully get into your lungs better.   We will see you in follow-up in 3 to 4 weeks time either with me or the nurse practitioner.

## 2019-10-18 NOTE — Progress Notes (Signed)
Subjective:    Patient ID: Christine Serrano, female    DOB: 05/12/1961, 59 y.o.   MRN: [id number]   PT PROFILE: 14 F recalcitrant smoker with severe, oxygen-dependent COPD hospitalized multiple times for COPD exacerbation initially requiring NIPPV in 2017, former patient of Dr. Alva Garnet, I am assuming care after Dr. Lora Havens departure.  PROBLEMS: Recalcitrant smoker Chronic hypoxic respiratory failure Very severe COPD/emphysema Chronic bronchitis  DATA: CT chest 03/13/16: Severe emphysema with extensive disease in the upper lobes and superior segment of the right lower lobe. This could represent acute and/or chronic changes. In addition, there is an irregular spiculated density along the right minor fissure which is indeterminate. PFTs 01/15/18: FVC: 1.42 L (48 %pred), FEV1: 0.76 L (31 %pred), FEV1/FVC: 53% , TLC: 4.49 L (94 %pred), DLCO 30 %pred, DLCO/VA 51%. TLC likely underestimated by gas dilution technique. Severe Gas trapping with RV at 172% predicted. CT chest 10/13/2019: Severe emphysema particularly upper lobes with upper lobe predominant scarring and architectural distortion no significant change from November 25, 2016 CT.  Coronary artery calcifications and aortic calcification was noted. Chest x-ray 10/16/2019: Hyperinflated lungs with diffuse interstitial prominence, no acute infiltrate.     INTERVAL:  Last seen  05/11/2018 by Dr. Alva Garnet.  Lost to follow-up since then.  Recent exacerbation 16 Oct 2019, seen in ED. very severe COPD.  HPI Patient is a 59 year old current smoker (2 PPD) who presents for follow-up after recent visit to the emergency room for exacerbation of COPD.  She has very severe COPD GOLD class IV with last FEV1 of record of 0.76 L or 31% predicted in 2019.  She previously followed with Dr. Merton Border and I am assuming care after he has left the practice.  She was evaluated as noted on 1 May in the emergency room for increased cough with change in sputum color  and increased shortness of breath over her baseline.  She had not had any fevers, chills or sweats.  No chest pain.  She had seen her primary care physician the day prior with similar complaints.  She was treated appropriately with a prednisone taper and with doxycycline.  The emergency room with nebulization treatments and with IV Solu-Medrol.  Chest x-ray showed no acute process.  She was discharged from the ED with same instructions as the day prior to complete prednisone and doxycycline.  She presents today for follow-up of these issues.  She notes that she is on Symbicort and Spiriva but these do not seem to be helping her anymore.  She is on 4 L/min nasal cannula via portable concentrator with pulsed mode.  She continues to smoke 2 packs of cigarettes per day.  She has been resorting to using her DuoNeb rescue nebulizer up to 4-5 times a day during this episode.  Had a low-dose lung cancer screening CT scan of the chest on 28 April which showed no acute infiltrate however the patient has extensive scarring in bilateral upper lobe predominant emphysema and extensive architectural distortion.  Chest x-ray in the ED showed marked hyperinflation with no acute process.  Patient voices no other complaints.  She has not had any chest pain, paroxysmal nocturnal dyspnea or orthopnea.  She is not motivated to and does not desire to quit smoking.  She does not feel that she is getting enough of the medication from her inhalers.  Given the severity of her COPD she may not have enough breath-holding capacity to be able to use inhalers effectively.  Review of  Systems A 10 point review of systems was performed and it is as noted above otherwise negative.  Immunization History  Administered Date(s) Administered  . Influenza Split 04/17/2016  . Influenza,inj,Quad PF,6+ Mos 02/21/2017, 03/17/2018, 02/25/2019  . Pneumococcal Conjugate-13 05/11/2018  . Pneumococcal Polysaccharide-23 06/17/2008   Current Meds    Medication Sig  . albuterol (VENTOLIN HFA) 108 (90 Base) MCG/ACT inhaler Inhale 2 puffs into the lungs every 6 (six) hours as needed for wheezing or shortness of breath.  Marland Kitchen aspirin EC 81 MG tablet Take 81 mg by mouth daily.  Marland Kitchen buPROPion (WELLBUTRIN SR) 100 MG 12 hr tablet Take 1 tablet (100 mg total) by mouth daily.  . Calcium Ascorbate 500 MG TABS Take 1 tablet (500 mg total) by mouth daily.  Marland Kitchen doxycycline (VIBRA-TABS) 100 MG tablet Take 1 tablet (100 mg total) by mouth 2 (two) times daily.  . fluticasone (FLONASE) 50 MCG/ACT nasal spray Place 2 sprays into both nostrils daily.  . folic acid (FOLVITE) 1 MG tablet Take 1 tablet (1 mg total) by mouth daily.  Marland Kitchen gabapentin (NEURONTIN) 100 MG capsule Take 1 capsule (100 mg total) by mouth 2 (two) times daily.  . hydrOXYzine (ATARAX/VISTARIL) 25 MG tablet TAKE 1 TABLET (25 MG TOTAL) BY MOUTH 2 (TWO) TIMES DAILY AS NEEDED FOR ANXIETY.  Marland Kitchen ipratropium-albuterol (DUONEB) 0.5-2.5 (3) MG/3ML SOLN Inhale 3 mLs into the lungs every 6 (six) hours as needed.  Marland Kitchen LINZESS 290 MCG CAPS capsule Take 1 capsule (290 mcg total) by mouth daily as needed (constipation).  . meclizine (ANTIVERT) 25 MG tablet Take 1 tablet (25 mg total) by mouth 3 (three) times daily as needed for dizziness.  . Multiple Vitamin (MULTIVITAMIN WITH MINERALS) TABS tablet Take 1 tablet by mouth daily.  . naproxen (NAPROSYN) 375 MG tablet Take 1 tablet (375 mg total) by mouth 2 (two) times daily as needed for moderate pain.  Marland Kitchen omeprazole (PRILOSEC) 40 MG capsule TAKE 1 CAPSULE BY MOUTH DAILY. FOR THREE WEEKS THEN AS NEEDED. TAKE 30 MIN PRIOR TO LARGE MEAL (Patient taking differently: Take 40 mg by mouth as needed (30 MIN PRIOR TO LARGE MEAL). )  . OXYGEN Inhale 3 L/min into the lungs continuous.  . predniSONE (DELTASONE) 20 MG tablet Take two tablets daily for 3 days followed by one tablet daily for 4 days  . sertraline (ZOLOFT) 100 MG tablet Take 1.5 tablets (150 mg total) by mouth daily.  Marland Kitchen  thiamine 100 MG tablet Take 1 tablet (100 mg total) by mouth daily.  . vitamin B-12 (CYANOCOBALAMIN) 1000 MCG tablet Take 1,000 mcg by mouth daily.  . vitamin E 400 UNIT capsule Take 400 Units by mouth daily.   . [DISCONTINUED] SYMBICORT 160-4.5 MCG/ACT inhaler Inhale 2 puffs into the lungs 2 (two) times daily.  . [DISCONTINUED] tiotropium (SPIRIVA) 18 MCG inhalation capsule Place 1 capsule (18 mcg total) into inhaler and inhale daily.       Objective:   Physical Exam BP (!) 108/58 (BP Location: Left Arm, Cuff Size: Normal)   Pulse (!) 116   Temp (!) 97.4 F (36.3 C) (Temporal)   Ht 5\' 4"  (1.626 m)   Wt 112 lb (50.8 kg)   SpO2 96%   BMI 19.22 kg/m  GENERAL: Cachectic patient, presents in transport chair, using accessories of respiration.  No acute distress however.  Using portable oxygen concentrator with nasal cannula which is tobacco stained.  Looks much older than stated age. HEAD: Normocephalic, atraumatic.  Bitemporal wasting noted EYES:  Pupils equal, round, reactive to light.  No scleral icterus.  MOUTH: Nose/mouth/throat not examined due to masking requirements for COVID 19. NECK: Supple. No thyromegaly. No nodules. No JVD.  PULMONARY: Poor air entry bilaterally.  Very distant breath sounds.  No adventitious sounds.  Difficult to determine due to poor air entry. CARDIOVASCULAR: S1 and S2. Regular rate and rhythm.  No rubs murmurs or gallops heard GASTROINTESTINAL: Scaphoid abdomen, nondistended. MUSCULOSKELETAL: No joint deformity, no clubbing, no edema.  NEUROLOGIC: No overt focal deficits.  Awake, alert, speech is fluent. SKIN: Intact,warm,dry.  No overt rashes noted. PSYCH: Mood and behavior is appropriate  Representative images of most recent CT/CXR, independently reviewed:             Assessment & Plan:   Very severe COPD stage IV GOLD criteria COPD exacerbation Continue prednisone and doxycycline Suspect she is not getting maximum effect from MDIs due to  inability to breath-hold Discontinue Symbicort Place on Brovana and Pulmicort via nebulizer twice a day Switch Spiriva to Spiriva Respimat (Yupelri via nebulizer not covered by her insurance) 2 inhalations daily Referral to Palliative Care Follow-up in 3 to 4 weeks time with me or nurse practitioner  Chronic respiratory failure with hypoxia Continue supplemental oxygen at 4 L/min  Tobacco dependence due to cigarettes Patient counseled regards to discontinuation of smoking Total counseling time 5 minutes or more Note patient is on low-dose CT lung cancer screening, recommend discontinuing program given that she is not a candidate for therapy given the advanced nature of her pulmonary disease  C. Derrill Kay, MD [facility]   *This note was dictated using voice recognition software/Dragon.  Despite best efforts to proofread, errors can occur which can change the meaning.  Any change was purely unintentional.

## 2019-10-19 ENCOUNTER — Telehealth: Payer: Self-pay

## 2019-10-19 NOTE — Telephone Encounter (Signed)
Received fax from [facility] that PA was denied. It stated that it was denied because the patient is on Symbicort. The patient was taken off Symbicort and switched to Brovana at recent appointment. Appeal has been sent through [facility]. Will wait to hear back on response.

## 2019-10-19 NOTE — Telephone Encounter (Signed)
Spoke with patient to introduce Palliative Care and to offer to schedule visit with NP. Verbal consent obtained. Visit scheduled for 10/26/19 @ 11:00 am

## 2019-10-20 ENCOUNTER — Encounter: Payer: Self-pay | Admitting: Family Medicine

## 2019-10-20 ENCOUNTER — Ambulatory Visit (INDEPENDENT_AMBULATORY_CARE_PROVIDER_SITE_OTHER): Payer: Medicare HMO | Admitting: Family Medicine

## 2019-10-20 ENCOUNTER — Other Ambulatory Visit: Payer: Self-pay

## 2019-10-20 VITALS — BP 110/70 | HR 87 | Temp 98.1°F | Ht 64.0 in | Wt 111.5 lb

## 2019-10-20 DIAGNOSIS — J9611 Chronic respiratory failure with hypoxia: Secondary | ICD-10-CM | POA: Diagnosis not present

## 2019-10-20 DIAGNOSIS — J449 Chronic obstructive pulmonary disease, unspecified: Secondary | ICD-10-CM

## 2019-10-20 DIAGNOSIS — Z515 Encounter for palliative care: Secondary | ICD-10-CM | POA: Diagnosis not present

## 2019-10-20 DIAGNOSIS — J441 Chronic obstructive pulmonary disease with (acute) exacerbation: Secondary | ICD-10-CM

## 2019-10-20 DIAGNOSIS — Z720 Tobacco use: Secondary | ICD-10-CM

## 2019-10-20 DIAGNOSIS — Z7409 Other reduced mobility: Secondary | ICD-10-CM | POA: Diagnosis not present

## 2019-10-20 DIAGNOSIS — Z9981 Dependence on supplemental oxygen: Secondary | ICD-10-CM | POA: Diagnosis not present

## 2019-10-20 MED ORDER — PERFOROMIST 20 MCG/2ML IN NEBU: 20.0000 ug | INHALATION_SOLUTION | Freq: Two times a day (BID) | RESPIRATORY_TRACT | 3 refills | Status: DC

## 2019-10-20 MED ORDER — DOXYCYCLINE HYCLATE 100 MG PO TABS: 100.0000 mg | ORAL_TABLET | Freq: Two times a day (BID) | ORAL | 0 refills | Status: DC

## 2019-10-20 NOTE — Progress Notes (Signed)
This visit was conducted in person.  BP 110/70 (BP Location: Left Arm, Patient Position: Sitting, Cuff Size: Normal)   Pulse 87 Comment: right hand  Temp 98.1 F (36.7 C) (Temporal)   Ht 5\' 4"  (1.626 m)   Wt 111 lb 8 oz (50.6 kg)   SpO2 96%   BMI 19.14 kg/m    CC: mobility assessment Subjective:    Patient ID: Christine Serrano, female    DOB: June 18, 1960, 59 y.o.   MRN: [id number]  HPI: Christine Serrano is a 59 y.o. female presenting on 10/20/2019 for Mobility Assessement (Pt accompanied by partner, Levada Dy- temp 97.9. ) and Medication Refill (Needs refill for doxycycline.  Misplaced previous rx. )   Since last seen here for memory assessment and COPD exacerbation, seen at ER the next day with ongoing COPD exacerbation treated with nebulization and 125mg  solumedrol (via EMS). CXR and labs reassuring at ER. She misplaced her doxycycline course and requests a refill. Since then, has re established with pulm (Dr Patsey Berthold - seen earlier this week). Note reviewed - planning to start brovana and pulmicort nebs BID and change spiriva to spiriva respimat - concern not getting enough medication from inhalers due to inability to take breaths deep enough to receive full benefit of medication due to severe COPD. Has been referred to palliative care as well, rec stop lung cancer screening at this time. On chronic oxygen via 4L by [place].   Here today for mobility assessment for scooter evaluation - told [employer] Medicare would cover this through a local DME store, previously not covered through home health agency.   Ambulation is severely limited by chronic respiratory failure - she is unable to walk 50 feet without stopping to rest due to dyspnea. This limits her MRADLs. She is able to use hands to guide scooter. Cane/walker are not enough to prevent falls due to generalized weakness from deconditioning. Unable to use manual wheelchair because of easy arm strength fatigability - arms quickly give out whenever  exerting.   No paresthesias of feet.   She lives in a 1 story house with 1 step to get into the house, has hard floor at home.  She does not have ramp but is planning to install one.   She is able to safely get on and off scooter.  She is able to operate the steering system.  She is able to maintain postural stability and position while operating scooter.  She has mental and physical capacity to operate scooter.  She endorses her home provides adequate access between the rooms, maneuvering space, surfaces for operating POV.  Using a POV will significantly improve her ability to participate in MRADLs, she will use POV in the home.      Relevant past medical, surgical, family and social history reviewed and updated as indicated. Interim medical history since our last visit reviewed. Allergies and medications reviewed and updated. Outpatient Medications Prior to Visit  Medication Sig Dispense Refill  . albuterol (VENTOLIN HFA) 108 (90 Base) MCG/ACT inhaler Inhale 2 puffs into the lungs every 6 (six) hours as needed for wheezing or shortness of breath. 54 g 1  . aspirin EC 81 MG tablet Take 81 mg by mouth daily.    . budesonide (PULMICORT) 0.5 MG/2ML nebulizer solution Take 2 mLs (0.5 mg total) by nebulization in the morning and at bedtime. 120 mL 11  . buPROPion (WELLBUTRIN SR) 100 MG 12 hr tablet Take 1 tablet (100 mg total) by mouth daily. [place]  tablet 1  . Calcium Ascorbate 500 MG TABS Take 1 tablet (500 mg total) by mouth daily.  0  . fluticasone (FLONASE) 50 MCG/ACT nasal spray Place 2 sprays into both nostrils daily. 48 g 3  . folic acid (FOLVITE) 1 MG tablet Take 1 tablet (1 mg total) by mouth daily. 90 tablet 3  . formoterol (PERFOROMIST) 20 MCG/2ML nebulizer solution Take 2 mLs (20 mcg total) by nebulization 2 (two) times daily. 2 mL 3  . gabapentin (NEURONTIN) 100 MG capsule Take 1 capsule (100 mg total) by mouth 2 (two) times daily. 180 capsule 1  . hydrOXYzine (ATARAX/VISTARIL) 25  MG tablet TAKE 1 TABLET (25 MG TOTAL) BY MOUTH 2 (TWO) TIMES DAILY AS NEEDED FOR ANXIETY. 180 tablet 0  . ipratropium-albuterol (DUONEB) 0.5-2.5 (3) MG/3ML SOLN Inhale 3 mLs into the lungs every 6 (six) hours as needed. 360 mL 3  . LINZESS 290 MCG CAPS capsule Take 1 capsule (290 mcg total) by mouth daily as needed (constipation). 30 capsule 3  . meclizine (ANTIVERT) 25 MG tablet Take 1 tablet (25 mg total) by mouth 3 (three) times daily as needed for dizziness. 30 tablet 0  . Multiple Vitamin (MULTIVITAMIN WITH MINERALS) TABS tablet Take 1 tablet by mouth daily.    . naproxen (NAPROSYN) 375 MG tablet Take 1 tablet (375 mg total) by mouth 2 (two) times daily as needed for moderate pain. 40 tablet 1  . omeprazole (PRILOSEC) 40 MG capsule TAKE 1 CAPSULE BY MOUTH DAILY. FOR THREE WEEKS THEN AS NEEDED. TAKE 30 MIN PRIOR TO LARGE MEAL (Patient taking differently: Take 40 mg by mouth as needed (30 MIN PRIOR TO LARGE MEAL). ) 90 capsule 1  . OXYGEN Inhale 3 L/min into the lungs continuous.    . predniSONE (DELTASONE) 20 MG tablet Take two tablets daily for 3 days followed by one tablet daily for 4 days 10 tablet 0  . sertraline (ZOLOFT) 100 MG tablet Take 1.5 tablets (150 mg total) by mouth daily. 135 tablet 3  . thiamine 100 MG tablet Take 1 tablet (100 mg total) by mouth daily. 30 tablet 0  . Tiotropium Bromide Monohydrate (SPIRIVA RESPIMAT) 2.5 MCG/ACT AERS Inhale 2 puffs into the lungs daily. 4 g 11  . Tiotropium Bromide Monohydrate (SPIRIVA RESPIMAT) 2.5 MCG/ACT AERS Inhale 2 puffs into the lungs daily. 4 g 0  . traZODone (DESYREL) 50 MG tablet Take 25 tablets by mouth at bedtime.    . vitamin B-12 (CYANOCOBALAMIN) 1000 MCG tablet Take 1,000 mcg by mouth daily.    . vitamin E 400 UNIT capsule Take 400 Units by mouth daily.     Marland Kitchen doxycycline (VIBRA-TABS) 100 MG tablet Take 1 tablet (100 mg total) by mouth 2 (two) times daily. 14 tablet 0   No facility-administered medications prior to visit.      Per HPI unless specifically indicated in ROS section below Review of Systems Objective:  BP 110/70 (BP Location: Left Arm, Patient Position: Sitting, Cuff Size: Normal)   Pulse 87 Comment: right hand  Temp 98.1 F (36.7 C) (Temporal)   Ht 5\' 4"  (1.626 m)   Wt 111 lb 8 oz (50.6 kg)   SpO2 96%   BMI 19.14 kg/m   Wt Readings from Last 3 Encounters:  10/20/19 111 lb 8 oz (50.6 kg)  10/18/19 112 lb (50.8 kg)  10/16/19 111 lb (50.3 kg)      Physical Exam Vitals and nursing note reviewed.  Constitutional:  Appearance: She is not ill-appearing (chronic).     Comments:  Weber in place Frail appearing  Cardiovascular:     Rate and Rhythm: Normal rate and regular rhythm.     Pulses: Normal pulses.     Heart sounds: Normal heart sounds. No murmur.  Pulmonary:     Effort: Pulmonary effort is normal. No respiratory distress.     Breath sounds: Wheezing and rhonchi present. No rales.     Comments: Coarse breath sounds with some wheezing throughout  Musculoskeletal:     Right lower leg: No edema.     Left lower leg: No edema.     Comments: Diminished pulse L arm  Skin:    General: Skin is warm and dry.     Findings: No rash.  Neurological:     Mental Status: She is alert.     Sensory: Sensation is intact.     Motor: Weakness present.     Coordination: Romberg sign negative.     Gait: Gait abnormal (unsteady).     Deep Tendon Reflexes:     Reflex Scores:      Patellar reflexes are 2+ on the right side and 2+ on the left side.      Achilles reflexes are 2+ on the right side and 2+ on the left side.    Comments:  CN grossly intact Grip strength intact 5/5 strength BUE 4-5/5 strength BLE: decreased strength on testing of knee flexors bilaterally Unsteady gait, unsteady with romberg but not ataxic  Psychiatric:        Mood and Affect: Mood normal.        Behavior: Behavior normal.       Results for orders placed or performed during the hospital encounter of [id number]   Basic metabolic panel  Result Value Ref Range   Sodium 136 135 - 145 mmol/L   Potassium 3.6 3.5 - 5.1 mmol/L   Chloride 99 98 - 111 mmol/L   CO2 29 22 - 32 mmol/L   Glucose, Bld 94 70 - 99 mg/dL   BUN 6 6 - 20 mg/dL   Creatinine, Ser 0.57 0.44 - 1.00 mg/dL   Calcium 8.8 (L) 8.9 - 10.3 mg/dL   GFR calc non Af Amer >60 >60 mL/min   GFR calc Af Amer >60 >60 mL/min   Anion gap 8 5 - 15  CBC with Differential  Result Value Ref Range   WBC 9.1 4.0 - 10.5 K/uL   RBC 4.29 3.87 - 5.11 MIL/uL   Hemoglobin 12.3 12.0 - 15.0 g/dL   HCT 37.3 36.0 - 46.0 %   MCV 86.9 80.0 - 100.0 fL   MCH 28.7 26.0 - 34.0 pg   MCHC 33.0 30.0 - 36.0 g/dL   RDW 14.0 11.5 - 15.5 %   Platelets 190 150 - 400 K/uL   nRBC 0.0 0.0 - 0.2 %   Neutrophils Relative % 46 %   Neutro Abs 4.3 1.7 - 7.7 K/uL   Lymphocytes Relative 44 %   Lymphs Abs 4.0 0.7 - 4.0 K/uL   Monocytes Relative 7 %   Monocytes Absolute 0.6 0.1 - 1.0 K/uL   Eosinophils Relative 2 %   Eosinophils Absolute 0.2 0.0 - 0.5 K/uL   Basophils Relative 1 %   Basophils Absolute 0.1 0.0 - 0.1 K/uL   Immature Granulocytes 0 %   Abs Immature Granulocytes 0.03 0.00 - 0.07 K/uL  Troponin I (High Sensitivity)  Result Value Ref Range   Troponin  I (High Sensitivity) 3 <18 ng/L   Assessment & Plan:  This visit occurred during the SARS-CoV-2 public health emergency.  Safety protocols were in place, including screening questions prior to the visit, additional usage of staff PPE, and extensive cleaning of exam room while observing appropriate contact time as indicated for disinfecting solutions.   Problem List Items Addressed This Visit    Tobacco abuse    Continued smoker. Currently not ready to consider change.       Palliative care status    Has been referred to palliative care by pulm - agree with this.       Impaired mobility - Primary    Mobility assessment completed today.  Recommend power scooter.  Rx written and she will take to local medical  equipment store.  Unable to use other ambulatory device modalities including cane, walker, manual wheelchair.       COPD, very severe (Christine Serrano)    Predominant reason for disability.  Controller medications have been changed to nebulizers, spiriva to respimat. Appreciate pulm care.       COPD exacerbation (HCC)    Doxy refilled. Continue prednisone taper.       Chronic respiratory failure with hypoxia, on home O2 therapy (HCC)       Meds ordered this encounter  Medications  . doxycycline (VIBRA-TABS) 100 MG tablet    Sig: Take 1 tablet (100 mg total) by mouth 2 (two) times daily.    Dispense:  14 tablet    Refill:  0    (lost Rx)   No orders of the defined types were placed in this encounter.   Patient Instructions  Call local medical equipment store to ask about how to get power scooter. I have written Rx today. Bring in any other forms that need to be filled out.  I do agree with palliative care evaluation.  Doxycycline refilled.   Follow up plan: No follow-ups on file.  Ria Bush, MD

## 2019-10-20 NOTE — Telephone Encounter (Signed)
Appeal was also denied for Brovana. We have put in order for Perforomist.   PA request received from [facility]  Drug requested: [place]: [id number] PA request has been sent to plan, and a determination is expected within 1-2 days.

## 2019-10-20 NOTE — Addendum Note (Signed)
Addended by: Lia Foyer R on: 10/20/2019 08:46 AM   Modules accepted: Orders

## 2019-10-20 NOTE — Patient Instructions (Addendum)
Call local medical equipment store to ask about how to get power scooter. I have written Rx today. Bring in any other forms that need to be filled out.  I do agree with palliative care evaluation.  Doxycycline refilled.

## 2019-10-23 ENCOUNTER — Encounter: Payer: Self-pay | Admitting: Family Medicine

## 2019-10-23 DIAGNOSIS — Z515 Encounter for palliative care: Secondary | ICD-10-CM | POA: Insufficient documentation

## 2019-10-23 NOTE — Assessment & Plan Note (Signed)
Has been referred to palliative care by pulm - agree with this.

## 2019-10-23 NOTE — Assessment & Plan Note (Signed)
Doxy refilled. Continue prednisone taper.

## 2019-10-23 NOTE — Assessment & Plan Note (Signed)
Continued smoker. Currently not ready to consider change.

## 2019-10-23 NOTE — Assessment & Plan Note (Signed)
Predominant reason for disability.  Controller medications have been changed to nebulizers, spiriva to respimat. Appreciate pulm care.

## 2019-10-23 NOTE — Assessment & Plan Note (Signed)
Mobility assessment completed today.  Recommend power scooter.  Rx written and she will take to local medical equipment store.  Unable to use other ambulatory device modalities including cane, walker, manual wheelchair.

## 2019-10-26 ENCOUNTER — Other Ambulatory Visit: Payer: Self-pay

## 2019-10-26 ENCOUNTER — Other Ambulatory Visit: Payer: Self-pay | Admitting: Adult Health Nurse Practitioner

## 2019-10-26 DIAGNOSIS — J9611 Chronic respiratory failure with hypoxia: Secondary | ICD-10-CM

## 2019-10-26 DIAGNOSIS — Z9981 Dependence on supplemental oxygen: Secondary | ICD-10-CM

## 2019-10-26 DIAGNOSIS — Z515 Encounter for palliative care: Secondary | ICD-10-CM

## 2019-10-26 NOTE — Progress Notes (Signed)
[place] Consult Note Telephone: [phone]  Fax: [phone]  PATIENT NAME: Christine Serrano DOB: Oct 31, 1960 MRN: [id number]  PRIMARY CARE PROVIDER:   Ria Bush, MD  REFERRING PROVIDER:  Ria Bush, MD [place],  [place] [address]  RESPONSIBLE PARTY:   Self [phone] Christine Serrano SO/HCPOA [phone]    RECOMMENDATIONS and PLAN:  1.  Advanced care planning.  Patient is a DNR.  Filled out MOST to include comfort measures, antibiotics for infection, IV fluids for trial period, and no feeding tube.  Filled out MOST and DNR, uploaded to [facility] and left originals with the patient.    2.  Severe COPD.  Patient is becoming increasingly dyspneic.  She cannot walk 50 feet without having to catch her breath.  She is requiring more assistance with showering/bathing.  She can dress herself but has to take frequent breaks due to dyspnea.  Has had hospitalization in late November 2020 and ER visit May 1,2021 for COPD exacerbation.  Also had hospitalization in March 2021 for hypocalcemia.  She is becoming more dyspneic and now has O2 at 4L at rest and has to increase to 5 L with activity.  She has to stop often with any activity due to dyspnea.  She was recently seen by pulmonology and states that she was told that there was nothing else they could do  for her.  Her inhalers were changed to nebulizer treatments due to inability to take a deep enough breath to get enough medicine with the inhalers.  She is in the process of getting an Transport planner as her PCP states that she is unable to wheel a wheelchair due to her dyspnea.  She sleeps with head of bed elevated and with 2 pillows.  Her appetite is decreasing and her significant other states that she may eat one meal a day if that.  She has not started losing weight yet and her BMI is 19.14 and current weight is 111 pounds.  We discussed disease progression and  palliative versus hospice services.  Will continue discussion at next visit in 2 weeks.    3.  Support.  Patient having problems finding a medical supply company that has the scooters.  Will reach out to office to see if we know of a medical supply company that can supply this for her.   Palliative will continue to monitor for symptom management/decline and make recommendations as needed.  Next appointment is in 2 weeks.  I spent 120 minutes providing this consultation,  from 10:30 to 12:30 including time spent with patient/family, chart review, provider coordination, documentation. More than 50% of the time in this consultation was spent coordinating communication.   HISTORY OF PRESENT ILLNESS:  Christine Serrano is a 59 y.o. year old female with multiple medical problems including severe COPD, HLD, depression. Palliative Care was asked to help address goals of care.   CODE STATUS: DNR  PPS: 40% HOSPICE ELIGIBILITY/DIAGNOSIS: TBD  PHYSICAL EXAM:  BP  108/60  HR 107  O2 99% on 4L General: NAD, frail appearing, thin Cardiovascular: tachycardic; regular rate and rhythm Pulmonary: lung sounds diminished with poor air movement; normal respiratory effort Abdomen: soft, nontender, + bowel sounds GU: no suprapubic tenderness Extremities: no edema, no joint deformities Skin: no rashes on exposed skin Neurological: Weakness but otherwise nonfocal; is having more forgetfulness   PAST MEDICAL HISTORY:  Past Medical History:  Diagnosis Date  . Cervical cancer ([place])   .  Chronic idiopathic constipation   . Community acquired pneumonia of right upper lobe of lung 12/10/2016  . COPD (chronic obstructive pulmonary disease) ([place])   . Depression   . Endometriosis   . HLD (hyperlipidemia)   . Smoker     SOCIAL HX:  Social History   Tobacco Use  . Smoking status: Current Every Day Smoker    Packs/day: 3.00    Years: 50.00    Pack years: 150.00    Types: Cigarettes  . Smokeless tobacco: Never  Used  . Tobacco comment: 10/18/19 pt somkes 2 packs per day [facility]  Substance Use Topics  . Alcohol use: Yes    Comment: occasional    ALLERGIES:  Allergies  Allergen Reactions  . Terbinafine And Related Diarrhea    Diarrheal illness while on terbinafine ?related to med  . Imitrex [Sumatriptan] Other (See Comments)    Worsens migraine      PERTINENT MEDICATIONS:  Outpatient Encounter Medications as of 10/26/2019  Medication Sig  . albuterol (VENTOLIN HFA) 108 (90 Base) MCG/ACT inhaler Inhale 2 puffs into the lungs every 6 (six) hours as needed for wheezing or shortness of breath.  Marland Kitchen aspirin EC 81 MG tablet Take 81 mg by mouth daily.  . budesonide (PULMICORT) 0.5 MG/2ML nebulizer solution Take 2 mLs (0.5 mg total) by nebulization in the morning and at bedtime.  Marland Kitchen buPROPion (WELLBUTRIN SR) 100 MG 12 hr tablet Take 1 tablet (100 mg total) by mouth daily.  . Calcium Ascorbate 500 MG TABS Take 1 tablet (500 mg total) by mouth daily.  Marland Kitchen doxycycline (VIBRA-TABS) 100 MG tablet Take 1 tablet (100 mg total) by mouth 2 (two) times daily.  . fluticasone (FLONASE) 50 MCG/ACT nasal spray Place 2 sprays into both nostrils daily.  . folic acid (FOLVITE) 1 MG tablet Take 1 tablet (1 mg total) by mouth daily.  . formoterol (PERFOROMIST) 20 MCG/2ML nebulizer solution Take 2 mLs (20 mcg total) by nebulization 2 (two) times daily.  Marland Kitchen gabapentin (NEURONTIN) 100 MG capsule Take 1 capsule (100 mg total) by mouth 2 (two) times daily.  . hydrOXYzine (ATARAX/VISTARIL) 25 MG tablet TAKE 1 TABLET (25 MG TOTAL) BY MOUTH 2 (TWO) TIMES DAILY AS NEEDED FOR ANXIETY.  Marland Kitchen ipratropium-albuterol (DUONEB) 0.5-2.5 (3) MG/3ML SOLN Inhale 3 mLs into the lungs every 6 (six) hours as needed.  Marland Kitchen LINZESS 290 MCG CAPS capsule Take 1 capsule (290 mcg total) by mouth daily as needed (constipation).  . meclizine (ANTIVERT) 25 MG tablet Take 1 tablet (25 mg total) by mouth 3 (three) times daily as needed for dizziness.  . Multiple Vitamin  (MULTIVITAMIN WITH MINERALS) TABS tablet Take 1 tablet by mouth daily.  . naproxen (NAPROSYN) 375 MG tablet Take 1 tablet (375 mg total) by mouth 2 (two) times daily as needed for moderate pain.  Marland Kitchen omeprazole (PRILOSEC) 40 MG capsule TAKE 1 CAPSULE BY MOUTH DAILY. FOR THREE WEEKS THEN AS NEEDED. TAKE 30 MIN PRIOR TO LARGE MEAL (Patient taking differently: Take 40 mg by mouth as needed (30 MIN PRIOR TO LARGE MEAL). )  . OXYGEN Inhale 3 L/min into the lungs continuous.  . predniSONE (DELTASONE) 20 MG tablet Take two tablets daily for 3 days followed by one tablet daily for 4 days  . sertraline (ZOLOFT) 100 MG tablet Take 1.5 tablets (150 mg total) by mouth daily.  Marland Kitchen thiamine 100 MG tablet Take 1 tablet (100 mg total) by mouth daily.  . Tiotropium Bromide Monohydrate (SPIRIVA RESPIMAT) 2.5 MCG/ACT AERS  Inhale 2 puffs into the lungs daily.  . Tiotropium Bromide Monohydrate (SPIRIVA RESPIMAT) 2.5 MCG/ACT AERS Inhale 2 puffs into the lungs daily.  . traZODone (DESYREL) 50 MG tablet Take 25 tablets by mouth at bedtime.  . vitamin B-12 (CYANOCOBALAMIN) 1000 MCG tablet Take 1,000 mcg by mouth daily.  . vitamin E 400 UNIT capsule Take 400 Units by mouth daily.    No facility-administered encounter medications on file as of 10/26/2019.     Willett Lefeber Jenetta Downer, NP

## 2019-11-02 NOTE — Telephone Encounter (Signed)
This request was denied under your Medicare Part D benefit; however,[employer] has approved coverage for your drug under your Part B benefit for/through 06/16/2020.  Nothing further needed at this time

## 2019-11-05 LAB — UNMAPPED LAB RESULTS
Basophil # (HT): 0.1 10 3/uL (ref 0.0–0.2)
Basophil % (HT): 1 % (ref 0–2)
Eosinophil # (HT): 0.1 10 3/uL (ref 0.0–0.5)
Eosinophil % (HT): 1 % (ref 0–7)
Hematocrit (HT): 43 % (ref 34–47)
Hemoglobin (HGB) (HT): 14.8 g/dL (ref 11.5–16.0)
Lymphocyte # (HT): 2.3 10 3/uL (ref 0.9–3.8)
Lymphocyte % (HT): 29 % (ref 17–44)
MCHC (HT): 34.2 g/dL (ref 32.0–36.0)
MCV (HT): 87.7 fL (ref 81.0–99.0)
Mean Corpuscular Hemoglobin (MCH) (HT): 30 pg (ref 26.0–34.0)
Monocyte # (HT): 0.6 10 3/uL (ref 0.2–1.0)
Monocyte % (HT): 7 % (ref 4–12)
Neutrophil # (HT): 4.8 10 3/uL (ref 1.5–7.7)
Platelets (HT): 217 10 3/uL (ref 140–400)
RBC (HT): 4.94 10 6/uL (ref 3.80–5.20)
RDW (HT): 12.9 % (ref 11.5–15.0)
Seg Neut % (HT): 62 % (ref 40–75)
WBC (HT): 7.8 10 3/uL (ref 4.0–10.8)

## 2019-11-06 DIAGNOSIS — J449 Chronic obstructive pulmonary disease, unspecified: Secondary | ICD-10-CM | POA: Diagnosis not present

## 2019-11-09 ENCOUNTER — Other Ambulatory Visit: Payer: Self-pay

## 2019-11-09 ENCOUNTER — Encounter: Payer: Self-pay | Admitting: Adult Health

## 2019-11-09 ENCOUNTER — Other Ambulatory Visit: Payer: Self-pay | Admitting: Adult Health Nurse Practitioner

## 2019-11-09 ENCOUNTER — Ambulatory Visit (INDEPENDENT_AMBULATORY_CARE_PROVIDER_SITE_OTHER): Payer: Medicare HMO | Admitting: Adult Health

## 2019-11-09 DIAGNOSIS — J449 Chronic obstructive pulmonary disease, unspecified: Secondary | ICD-10-CM

## 2019-11-09 DIAGNOSIS — Z9981 Dependence on supplemental oxygen: Secondary | ICD-10-CM

## 2019-11-09 DIAGNOSIS — J9611 Chronic respiratory failure with hypoxia: Secondary | ICD-10-CM | POA: Diagnosis not present

## 2019-11-09 DIAGNOSIS — Z720 Tobacco use: Secondary | ICD-10-CM | POA: Diagnosis not present

## 2019-11-09 DIAGNOSIS — Z515 Encounter for palliative care: Secondary | ICD-10-CM | POA: Diagnosis not present

## 2019-11-09 NOTE — Assessment & Plan Note (Signed)
Smoking cessation discussed 

## 2019-11-09 NOTE — Assessment & Plan Note (Signed)
Very severe COPD Gold D prone to frequent exacerbations and has high symptom burden.  Support palliative or hospice care to help with her in the community setting.  Will change back to inhalers to help with affordability.  Patient may also use albuterol nebulizer as needed.  Plan  Patient Instructions  Restart Symbicort and Spiriva .  Can use Albuterol Neb 3-4 times a day as needed.  Continue on Oxygen 4l/m  Work on cutting back on smoking . Please do not smoke around smoking , do not smoke in the home.  Continue with Palliative care .  Covid vaccine as discussed.  Follow up with Dr. Patsey Berthold in 3 months and As needed   Please contact office for sooner follow up if symptoms do not improve or worsen or seek emergency care

## 2019-11-09 NOTE — Assessment & Plan Note (Signed)
New on oxygen at 4 L to keep O2 saturations greater than 88 to 90%.  Long discussion regarding dangers of smoking while having on oxygen

## 2019-11-09 NOTE — Progress Notes (Signed)
[place] Consult Note Telephone: [phone]  Fax: [phone]  PATIENT NAME: Christine Serrano DOB: July 06, 1960 MRN: [id number]  PRIMARY CARE PROVIDER:   Ria Bush, MD  REFERRING PROVIDER:  Ria Bush, MD [place],   [id number]  RESPONSIBLE PARTY:   Self [phone] Christine Serrano SO/HCPOA [phone]      RECOMMENDATIONS and PLAN:  1.  Advanced care planning.  Patient is a DNR.  2.  Severe COPD.  Patient states that she continues to have increasing dyspnea.  Patient is oxygen dependent at 4 L and will increase to 5 L with activity. She sleeps with head of bed elevated and with 2 pillows. States that she is requiring more help to take a shower.  She can dress herself but has to take frequent breaks due to dyspnea. She does state that she dreads taking a shower as this activity worsens her dyspnea.  She is still working on getting a Transport planner.  States that she has a good contact with [employer] also gave her contact for [place] medical supply to help with getting the scooter.  Have discussed patient's case with hospice physicians who agree that she is hospice eligible.  Patient is interested in hospice services once obtaining the scooter.  3.  Depression.  Patient is on several depression medications and is being managed for her depression by her PCP.  She states that she is more depressed with her current medical situation.  States that she has not been sleeping well and has been more tearful.  States that her appetite is very poor.  Is unsure if she is lost any weight over the past 2 weeks.  Most of our time today was spent talking about her concerns with her medical condition and concerns of loved ones when she passes.  Offered therapeutic and active listening.  Have reached out to PCP via email in epic with concerns for increased depression and if she needs to make an appointment with him to make any  adjustments to her medications.  Palliative will continue to monitor for symptom management/decline and make recommendations as needed.  Next appointment is in 2 weeks.  I spent 90 minutes providing this consultation,  from 11:00 to 12:30 including time spent with patient, chart review, provider coordination, documentaion. More than 50% of the time in this consultation was spent coordinating communication.   HISTORY OF PRESENT ILLNESS:  Christine Serrano is a 59 y.o. year old female with multiple medical problems including severe COPD, HLD, depression. Palliative Care was asked to help address goals of care.   CODE STATUS: DNR  PPS: 40% HOSPICE ELIGIBILITY/DIAGNOSIS: TBD   PHYSICAL EXAM:   General: NAD, frail appearing, thin Extremities: no edema, no joint deformities Skin: no rashes on exposed skin Neurological: Weakness but otherwise nonfocal   PAST MEDICAL HISTORY:  Past Medical History:  Diagnosis Date  . Cervical cancer ([place])   . Chronic idiopathic constipation   . Community acquired pneumonia of right upper lobe of lung 12/10/2016  . COPD (chronic obstructive pulmonary disease) ([place])   . Depression   . Endometriosis   . HLD (hyperlipidemia)   . Smoker     SOCIAL HX:  Social History   Tobacco Use  . Smoking status: Current Every Day Smoker    Packs/day: 3.00    Years: 50.00    Pack years: 150.00    Types: Cigarettes  . Smokeless tobacco: Never Used  . Tobacco  comment: 10/18/19 pt somkes 2 packs per day [facility]  Substance Use Topics  . Alcohol use: Yes    Comment: occasional    ALLERGIES:  Allergies  Allergen Reactions  . Terbinafine And Related Diarrhea    Diarrheal illness while on terbinafine ?related to med  . Imitrex [Sumatriptan] Other (See Comments)    Worsens migraine      PERTINENT MEDICATIONS:  Outpatient Encounter Medications as of 11/09/2019  Medication Sig  . albuterol (VENTOLIN HFA) 108 (90 Base) MCG/ACT inhaler Inhale 2 puffs into the lungs every  6 (six) hours as needed for wheezing or shortness of breath.  Marland Kitchen aspirin EC 81 MG tablet Take 81 mg by mouth daily.  . budesonide (PULMICORT) 0.5 MG/2ML nebulizer solution Take 2 mLs (0.5 mg total) by nebulization in the morning and at bedtime. (Patient not taking: Reported on 11/09/2019)  . buPROPion (WELLBUTRIN SR) 100 MG 12 hr tablet Take 1 tablet (100 mg total) by mouth daily.  . Calcium Ascorbate 500 MG TABS Take 1 tablet (500 mg total) by mouth daily.  Marland Kitchen doxycycline (VIBRA-TABS) 100 MG tablet Take 1 tablet (100 mg total) by mouth 2 (two) times daily.  . fluticasone (FLONASE) 50 MCG/ACT nasal spray Place 2 sprays into both nostrils daily.  . folic acid (FOLVITE) 1 MG tablet Take 1 tablet (1 mg total) by mouth daily.  . formoterol (PERFOROMIST) 20 MCG/2ML nebulizer solution Take 2 mLs (20 mcg total) by nebulization 2 (two) times daily. (Patient not taking: Reported on 11/09/2019)  . gabapentin (NEURONTIN) 100 MG capsule Take 1 capsule (100 mg total) by mouth 2 (two) times daily.  . hydrOXYzine (ATARAX/VISTARIL) 25 MG tablet TAKE 1 TABLET (25 MG TOTAL) BY MOUTH 2 (TWO) TIMES DAILY AS NEEDED FOR ANXIETY.  Marland Kitchen ipratropium-albuterol (DUONEB) 0.5-2.5 (3) MG/3ML SOLN Inhale 3 mLs into the lungs every 6 (six) hours as needed.  Marland Kitchen LINZESS 290 MCG CAPS capsule Take 1 capsule (290 mcg total) by mouth daily as needed (constipation).  . meclizine (ANTIVERT) 25 MG tablet Take 1 tablet (25 mg total) by mouth 3 (three) times daily as needed for dizziness.  . Multiple Vitamin (MULTIVITAMIN WITH MINERALS) TABS tablet Take 1 tablet by mouth daily.  . naproxen (NAPROSYN) 375 MG tablet Take 1 tablet (375 mg total) by mouth 2 (two) times daily as needed for moderate pain.  Marland Kitchen omeprazole (PRILOSEC) 40 MG capsule TAKE 1 CAPSULE BY MOUTH DAILY. FOR THREE WEEKS THEN AS NEEDED. TAKE 30 MIN PRIOR TO LARGE MEAL (Patient taking differently: Take 40 mg by mouth as needed (30 MIN PRIOR TO LARGE MEAL). )  . OXYGEN Inhale 3 L/min into  the lungs continuous.  . predniSONE (DELTASONE) 20 MG tablet Take two tablets daily for 3 days followed by one tablet daily for 4 days  . sertraline (ZOLOFT) 100 MG tablet Take 1.5 tablets (150 mg total) by mouth daily.  Marland Kitchen thiamine 100 MG tablet Take 1 tablet (100 mg total) by mouth daily.  . Tiotropium Bromide Monohydrate (SPIRIVA RESPIMAT) 2.5 MCG/ACT AERS Inhale 2 puffs into the lungs daily. (Patient not taking: Reported on 11/09/2019)  . Tiotropium Bromide Monohydrate (SPIRIVA RESPIMAT) 2.5 MCG/ACT AERS Inhale 2 puffs into the lungs daily. (Patient not taking: Reported on 11/09/2019)  . traZODone (DESYREL) 50 MG tablet Take 25 tablets by mouth at bedtime.  . vitamin B-12 (CYANOCOBALAMIN) 1000 MCG tablet Take 1,000 mcg by mouth daily.  . vitamin E 400 UNIT capsule Take 400 Units by mouth daily.  No facility-administered encounter medications on file as of 11/09/2019.     Orlandis Sanden Jenetta Downer, NP

## 2019-11-09 NOTE — Progress Notes (Signed)
@Patient  ID: Christine Serrano, female    DOB: 1960-07-27, 59 y.o.   MRN: [id number]  Chief Complaint  Patient presents with  . Follow-up    COPD     Referring provider: Ria Bush, MD  HPI: 59 year old female active heavy smoker followed for very severe COPD and chronic respiratory failure on oxygen at 4 L prone to recurrent exacerbations  TEST/EVENTS :  CT chest 03/13/16:Severe emphysema with extensive disease in the upper lobes and superior segment of the right lower lobe. This could represent acute and/or chronic changes. In addition, there is an irregular spiculated density along the right minor fissure which is indeterminate. PFTs 01/15/18: FVC: 1.42 L (48 %pred), FEV1: 0.76 L (31 %pred), FEV1/FVC: 53% , TLC: 4.49 L (94 %pred), DLCO 30 %pred, DLCO/VA 51%. TLC likely underestimated by gas dilution technique. Severe Gas trapping with RV at 172% predicted. CT chest 10/13/2019: Severe emphysema particularly upper lobes with upper lobe predominant scarring and architectural distortion no significant change from November 25, 2016 CT.  Coronary artery calcifications and aortic calcification was noted. Chest x-ray 10/16/2019: Hyperinflated lungs with diffuse interstitial prominence, no acute infiltrate.  11/09/2019 Follow up : COPD , O2 RF  Patient returns for a 2-week follow-up.  Patient was seen last visit for follow-up for COPD.  She continues to have recurrent exacerbations.  She was changed from Symbicort and Spiriva to nebulized budesonide and Perforomist.  Unfortunately patient was unable to afford this.  Wants to switch back to her inhalers.  Patient also has albuterol nebulizer that she uses 3-4 times a day.  Patient continues to smoke at least 2 packs of cigarettes a day.  Smoking cessation was cussed.  She has no interest in quitting smoking.  We did try to talk about cutting back on her smoking.  She also smokes with her oxygen on long discussion regarding the severe dangers of  smoking with oxygen.  Patient denies any increased cough or congestion.  She denies any chest pain orthopnea PND or increased leg swelling. At baseline patient has shortness of breath at rest.  Gets winded with minimal activity.  Has a hard time bathing or taking care of her self.  Appetite is decreased and she has had very slow weight loss.  We discussed her Covid vaccine.  Patient declined Covid vaccine at first.  But says she will consider this.  She was referred to palliative care.  She says that palliative care has rare recommended hospice.  Long discussion regarding both services.  Highly support either 1 to help patient in the community setting.    Allergies  Allergen Reactions  . Terbinafine And Related Diarrhea    Diarrheal illness while on terbinafine ?related to med  . Imitrex [Sumatriptan] Other (See Comments)    Worsens migraine     Immunization History  Administered Date(s) Administered  . Influenza Split 04/17/2016  . Influenza,inj,Quad PF,6+ Mos 02/21/2017, 03/17/2018, 02/25/2019  . Pneumococcal Conjugate-13 05/11/2018  . Pneumococcal Polysaccharide-23 06/17/2008    Past Medical History:  Diagnosis Date  . Cervical cancer ([place])   . Chronic idiopathic constipation   . Community acquired pneumonia of right upper lobe of lung 12/10/2016  . COPD (chronic obstructive pulmonary disease) ([place])   . Depression   . Endometriosis   . HLD (hyperlipidemia)   . Smoker     Tobacco History: Social History   Tobacco Use  Smoking Status Current Every Day Smoker  . Packs/day: 3.00  . Years: 50.00  . Pack  years: 150.00  . Types: Cigarettes  Smokeless Tobacco Never Used  Tobacco Comment   10/18/19 pt somkes 2 packs per day ARJ   Ready to quit: No Counseling given: Yes Comment: 10/18/19 pt somkes 2 packs per day ARJ   Outpatient Medications Prior to Visit  Medication Sig Dispense Refill  . albuterol (VENTOLIN HFA) 108 (90 Base) MCG/ACT inhaler Inhale 2 puffs into the  lungs every 6 (six) hours as needed for wheezing or shortness of breath. 54 g 1  . aspirin EC 81 MG tablet Take 81 mg by mouth daily.    Marland Kitchen buPROPion (WELLBUTRIN SR) 100 MG 12 hr tablet Take 1 tablet (100 mg total) by mouth daily. 90 tablet 1  . Calcium Ascorbate 500 MG TABS Take 1 tablet (500 mg total) by mouth daily.  0  . doxycycline (VIBRA-TABS) 100 MG tablet Take 1 tablet (100 mg total) by mouth 2 (two) times daily. 14 tablet 0  . fluticasone (FLONASE) 50 MCG/ACT nasal spray Place 2 sprays into both nostrils daily. 48 g 3  . folic acid (FOLVITE) 1 MG tablet Take 1 tablet (1 mg total) by mouth daily. 90 tablet 3  . gabapentin (NEURONTIN) 100 MG capsule Take 1 capsule (100 mg total) by mouth 2 (two) times daily. 180 capsule 1  . hydrOXYzine (ATARAX/VISTARIL) 25 MG tablet TAKE 1 TABLET (25 MG TOTAL) BY MOUTH 2 (TWO) TIMES DAILY AS NEEDED FOR ANXIETY. 180 tablet 0  . ipratropium-albuterol (DUONEB) 0.5-2.5 (3) MG/3ML SOLN Inhale 3 mLs into the lungs every 6 (six) hours as needed. 360 mL 3  . LINZESS 290 MCG CAPS capsule Take 1 capsule (290 mcg total) by mouth daily as needed (constipation). 30 capsule 3  . meclizine (ANTIVERT) 25 MG tablet Take 1 tablet (25 mg total) by mouth 3 (three) times daily as needed for dizziness. 30 tablet 0  . Multiple Vitamin (MULTIVITAMIN WITH MINERALS) TABS tablet Take 1 tablet by mouth daily.    . naproxen (NAPROSYN) 375 MG tablet Take 1 tablet (375 mg total) by mouth 2 (two) times daily as needed for moderate pain. 40 tablet 1  . omeprazole (PRILOSEC) 40 MG capsule TAKE 1 CAPSULE BY MOUTH DAILY. FOR THREE WEEKS THEN AS NEEDED. TAKE 30 MIN PRIOR TO LARGE MEAL (Patient taking differently: Take 40 mg by mouth as needed (30 MIN PRIOR TO LARGE MEAL). ) 90 capsule 1  . OXYGEN Inhale 3 L/min into the lungs continuous.    . predniSONE (DELTASONE) 20 MG tablet Take two tablets daily for 3 days followed by one tablet daily for 4 days 10 tablet 0  . sertraline (ZOLOFT) 100 MG  tablet Take 1.5 tablets (150 mg total) by mouth daily. 135 tablet 3  . thiamine 100 MG tablet Take 1 tablet (100 mg total) by mouth daily. 30 tablet 0  . traZODone (DESYREL) 50 MG tablet Take 25 tablets by mouth at bedtime.    . vitamin B-12 (CYANOCOBALAMIN) 1000 MCG tablet Take 1,000 mcg by mouth daily.    . vitamin E 400 UNIT capsule Take 400 Units by mouth daily.     . budesonide (PULMICORT) 0.5 MG/2ML nebulizer solution Take 2 mLs (0.5 mg total) by nebulization in the morning and at bedtime. (Patient not taking: Reported on 11/09/2019) 120 mL 11  . formoterol (PERFOROMIST) 20 MCG/2ML nebulizer solution Take 2 mLs (20 mcg total) by nebulization 2 (two) times daily. (Patient not taking: Reported on 11/09/2019) 2 mL 3  . Tiotropium Bromide Monohydrate (SPIRIVA RESPIMAT)  2.5 MCG/ACT AERS Inhale 2 puffs into the lungs daily. (Patient not taking: Reported on 11/09/2019) 4 g 11  . Tiotropium Bromide Monohydrate (SPIRIVA RESPIMAT) 2.5 MCG/ACT AERS Inhale 2 puffs into the lungs daily. (Patient not taking: Reported on 11/09/2019) 4 g 0   No facility-administered medications prior to visit.     Review of Systems:   Constitutional:   No  weight loss, night sweats,  Fevers, chills,  +fatigue, or  lassitude.  HEENT:   No headaches,  Difficulty swallowing,  Tooth/dental problems, or  Sore throat,                No sneezing, itching, ear ache, nasal congestion, post nasal drip,   CV:  No chest pain,  Orthopnea, PND, swelling in lower extremities, anasarca, dizziness, palpitations, syncope.   GI  No heartburn, indigestion, abdominal pain, nausea, vomiting, diarrhea, change in bowel habits, loss of appetite, bloody stools.   Resp:    No chest wall deformity  Skin: no rash or lesions.  GU: no dysuria, change in color of urine, no urgency or frequency.  No flank pain, no hematuria   MS:  No joint pain or swelling.  No decreased range of motion.  No back pain.    Physical Exam  BP 130/80 (BP  Location: Left Arm, Cuff Size: Normal)   Pulse (!) 111   Temp 98.5 F (36.9 C) (Oral)   Ht 5\' 4"  (1.626 m)   Wt 107 lb (48.5 kg)   SpO2 94%   BMI 18.37 kg/m   GEN: A/Ox3; pleasant , NAD, thin cachectic female on oxygen   HEENT:  [place]/AT, NOSE-clear, THROAT-clear, no lesions, no postnasal drip or exudate noted.   NECK:  Supple w/ fair ROM; no JVD; normal carotid impulses w/o bruits; no thyromegaly or nodules palpated; no lymphadenopathy.    RESP  Clear  P & A; w/o, wheezes/ rales/ or rhonchi. no accessory muscle use, no dullness to percussion  CARD:  RRR, no m/r/g, no peripheral edema, pulses intact, no cyanosis or clubbing.  GI:   Soft & nt; nml bowel sounds; no organomegaly or masses detected.   Musco: Warm bil, no deformities or joint swelling noted.   Neuro: alert, no focal deficits noted.    Skin: Warm, no lesions or rashes    Lab Results:  CBC    Component Value Date/Time   WBC 9.1 10/16/2019 0254   RBC 4.29 10/16/2019 0254   HGB 12.3 10/16/2019 0254   HCT 37.3 10/16/2019 0254   PLT 190 10/16/2019 0254   MCV 86.9 10/16/2019 0254   MCH 28.7 10/16/2019 0254   MCHC 33.0 10/16/2019 0254   RDW 14.0 10/16/2019 0254   LYMPHSABS 4.0 10/16/2019 0254   MONOABS 0.6 10/16/2019 0254   EOSABS 0.2 10/16/2019 0254   BASOSABS 0.1 10/16/2019 0254    BMET    Component Value Date/Time   NA 136 10/16/2019 0254   K 3.6 10/16/2019 0254   CL 99 10/16/2019 0254   CO2 29 10/16/2019 0254   GLUCOSE 94 10/16/2019 0254   BUN 6 10/16/2019 0254   CREATININE 0.57 10/16/2019 0254   CALCIUM 8.8 (L) 10/16/2019 0254   GFRNONAA >60 10/16/2019 0254   GFRAA >60 10/16/2019 0254    BNP No results found for: BNP  ProBNP No results found for: PROBNP  Imaging: DG Chest Portable 1 View  Result Date: 10/16/2019 CLINICAL DATA:  COPD exacerbation EXAM: PORTABLE CHEST 1 VIEW COMPARISON:  09/16/2019 FINDINGS: The lungs  are hyperinflated with diffuse interstitial prominence. No focal airspace  consolidation or pulmonary edema. No pleural effusion or pneumothorax. Normal cardiomediastinal contours. IMPRESSION: COPD without acute airspace disease. Electronically Signed   By: Ulyses Jarred M.D.   On: 10/16/2019 03:38   CT CHEST LUNG CANCER SCREENING LOW DOSE WO CONTRAST  Result Date: 10/13/2019 CLINICAL DATA:  Lung cancer screening. Current asymptomatic smoker with 100 pack-year history. EXAM: CT CHEST WITHOUT CONTRAST LOW-DOSE FOR LUNG CANCER SCREENING TECHNIQUE: Multidetector CT imaging of the chest was performed following the standard protocol without IV contrast. COMPARISON:  Routine CT chest with contrast 11/25/2016 FINDINGS: Cardiovascular: The heart size appears within normal limits. Aortic atherosclerosis identified. Lad and RCA coronary artery calcifications. Mediastinum/Nodes: No enlarged mediastinal, hilar, or axillary lymph nodes. Thyroid gland, trachea, and esophagus demonstrate no significant findings. Lungs/Pleura: Advanced changes of centrilobular and paraseptal emphysema. Diffuse bronchial wall thickening identified. Bilateral upper lobe predominant areas of scarring and architectural distortion are identified which appears progressive compared with 10/13/2019. More focal areas of confluent scarring and masslike architectural distortion are noted peripherally and may diminish sensitivity for detecting small pulmonary nodules. The largest lung nodule is in the superior segment of the right lower lobe with equivalent diameter of 7.1 mm. Upper Abdomen: No acute abnormality. Musculoskeletal: No chest wall mass or suspicious bone lesions identified. IMPRESSION: 1. Lung-RADS 3s, probably benign findings. Short-term follow-up in 6 months is recommended with repeat low-dose chest CT without contrast (please use the following order, "CT CHEST LCS NODULE FOLLOW-UP /O CM"). 2. Diffuse bronchial wall thickening with emphysema, as above; imaging findings suggestive of underlying COPD. 3. The S  modifier above refers to extensive bilateral peripheral and upper lobe predominant scarring and architectural distortion. Although similar to 11/25/2016. This chronic finding may diminish sensitivity for small pulmonary nodules. 4. Multi vessel coronary artery calcifications. 5. Emphysema and aortic atherosclerosis. Aortic Atherosclerosis (ICD10-I70.0) and Emphysema (ICD10-J43.9). Electronically Signed   By: Kerby Moors M.D.   On: 10/13/2019 15:17      No flowsheet data found.  No results found for: NITRICOXIDE      Assessment & Plan:   COPD, very severe ([place]) Very severe COPD Gold D prone to frequent exacerbations and has high symptom burden.  Support palliative or hospice care to help with her in the community setting.  Will change back to inhalers to help with affordability.  Patient may also use albuterol nebulizer as needed.  Plan  Patient Instructions  Restart Symbicort and Spiriva .  Can use Albuterol Neb 3-4 times a day as needed.  Continue on Oxygen 4l/m  Work on cutting back on smoking . Please do not smoke around smoking , do not smoke in the home.  Continue with Palliative care .  Covid vaccine as discussed.  Follow up with Dr. Patsey Berthold in 3 months and As needed   Please contact office for sooner follow up if symptoms do not improve or worsen or seek emergency care       Chronic respiratory failure with hypoxia, on home O2 therapy ([place]) New on oxygen at 4 L to keep O2 saturations greater than 88 to 90%.  Long discussion regarding dangers of smoking while having on oxygen  Tobacco abuse Smoking cessation discussed.     Rexene Edison, NP 11/09/2019

## 2019-11-09 NOTE — Patient Instructions (Addendum)
Restart Symbicort and Spiriva .  Can use Albuterol Neb 3-4 times a day as needed.  Continue on Oxygen 4l/m  Work on cutting back on smoking . Please do not smoke around smoking , do not smoke in the home.  Continue with Palliative care .  Covid vaccine as discussed.  Follow up with Dr. Patsey Berthold in 3 months and As needed   Please contact office for sooner follow up if symptoms do not improve or worsen or seek emergency care

## 2019-11-10 ENCOUNTER — Ambulatory Visit (INDEPENDENT_AMBULATORY_CARE_PROVIDER_SITE_OTHER): Payer: Medicare HMO | Admitting: Family Medicine

## 2019-11-10 ENCOUNTER — Other Ambulatory Visit: Payer: Self-pay

## 2019-11-10 ENCOUNTER — Encounter: Payer: Self-pay | Admitting: Family Medicine

## 2019-11-10 VITALS — BP 118/70 | HR 106 | Temp 98.0°F | Ht 64.0 in | Wt 107.5 lb

## 2019-11-10 DIAGNOSIS — F331 Major depressive disorder, recurrent, moderate: Secondary | ICD-10-CM

## 2019-11-10 DIAGNOSIS — S29012D Strain of muscle and tendon of back wall of thorax, subsequent encounter: Secondary | ICD-10-CM | POA: Insufficient documentation

## 2019-11-10 DIAGNOSIS — J9611 Chronic respiratory failure with hypoxia: Secondary | ICD-10-CM | POA: Diagnosis not present

## 2019-11-10 DIAGNOSIS — Z9981 Dependence on supplemental oxygen: Secondary | ICD-10-CM

## 2019-11-10 DIAGNOSIS — Z515 Encounter for palliative care: Secondary | ICD-10-CM

## 2019-11-10 DIAGNOSIS — J449 Chronic obstructive pulmonary disease, unspecified: Secondary | ICD-10-CM | POA: Diagnosis not present

## 2019-11-10 DIAGNOSIS — S29012A Strain of muscle and tendon of back wall of thorax, initial encounter: Secondary | ICD-10-CM | POA: Diagnosis not present

## 2019-11-10 MED ORDER — BUPROPION HCL ER (XL) 150 MG PO TB24: 150.0000 mg | ORAL_TABLET | Freq: Every day | ORAL | 1 refills | Status: DC

## 2019-11-10 MED ORDER — METHOCARBAMOL 500 MG PO TABS: 500.0000 mg | ORAL_TABLET | Freq: Three times a day (TID) | ORAL | 0 refills | Status: DC | PRN

## 2019-11-10 MED ORDER — MIRTAZAPINE 15 MG PO TABS: 15.0000 mg | ORAL_TABLET | Freq: Every day | ORAL | 6 refills | Status: DC

## 2019-11-10 NOTE — Progress Notes (Signed)
This visit was conducted in person.  BP 118/70 (BP Location: Right Arm, Patient Position: Sitting, Cuff Size: Normal)   Pulse (!) 106   Temp 98 F (36.7 C) (Temporal)   Ht 5\' 4"  (1.626 m)   Wt 107 lb 8 oz (48.8 kg)   SpO2 97% Comment: 4 L  BMI 18.45 kg/m    CC: L shoulder pain Subjective:    Patient ID: Christine Serrano, female    DOB: [birth year], 59 y.o.   MRN: [id number]  HPI: Christine Serrano is a 59 y.o. female presenting on 11/10/2019 for Shoulder Pain (C/o severe left shoulder pain.  Pain disturbing sleep.  Started about 2 days ago.  Tried Tylenol and ibuprofen, not helpful.  Tried friend's Robaxin 500 mg, helpful. )   Mobility assessment for POV completed earlier this month - awaiting approval. Looking into [facility] vs [facility].   Several day h/o L shoulder pain with unknown trigger, injury or fall. Seems to start at neck and radiates down shoulder, no shooting pain down arm. No numbness or weakness or paresthesias of arm.  She tried Angie's robaxin muscle relaxant with some benefit. Known chronic neck pain after horseback riding accident 1984.   Depression - acutely worsened since progressive dyspnea. No appetite, afraid to get in shower so hasn't showered in weeks. ++ anhedonia. This is despite wellbutrin SR 100mg  daily and sertraline 100mg  daily as well as trazodone 25mg  at night time. Long-term on sertraline and wellbutrin. No SI/HI.   Had to change COPD regimen as nebulizers were too expensive. Has discussed with pulm.      Relevant past medical, surgical, family and social history reviewed and updated as indicated. Interim medical history since our last visit reviewed. Allergies and medications reviewed and updated. Outpatient Medications Prior to Visit  Medication Sig Dispense Refill  . albuterol (VENTOLIN HFA) 108 (90 Base) MCG/ACT inhaler Inhale 2 puffs into the lungs every 6 (six) hours as needed for wheezing or shortness of breath. 54 g 1  . aspirin EC 81  MG tablet Take 81 mg by mouth daily.    . Calcium Ascorbate 500 MG TABS Take 1 tablet (500 mg total) by mouth daily.  0  . fluticasone (FLONASE) 50 MCG/ACT nasal spray Place 2 sprays into both nostrils daily. 48 g 3  . folic acid (FOLVITE) 1 MG tablet Take 1 tablet (1 mg total) by mouth daily. 90 tablet 3  . gabapentin (NEURONTIN) 100 MG capsule Take 1 capsule (100 mg total) by mouth 2 (two) times daily. 180 capsule 1  . hydrOXYzine (ATARAX/VISTARIL) 25 MG tablet TAKE 1 TABLET (25 MG TOTAL) BY MOUTH 2 (TWO) TIMES DAILY AS NEEDED FOR ANXIETY. 180 tablet 0  . ipratropium-albuterol (DUONEB) 0.5-2.5 (3) MG/3ML SOLN Inhale 3 mLs into the lungs every 6 (six) hours as needed. 360 mL 3  . LINZESS 290 MCG CAPS capsule Take 1 capsule (290 mcg total) by mouth daily as needed (constipation). 30 capsule 3  . meclizine (ANTIVERT) 25 MG tablet Take 1 tablet (25 mg total) by mouth 3 (three) times daily as needed for dizziness. 30 tablet 0  . Multiple Vitamin (MULTIVITAMIN WITH MINERALS) TABS tablet Take 1 tablet by mouth daily.    . naproxen (NAPROSYN) 375 MG tablet Take 1 tablet (375 mg total) by mouth 2 (two) times daily as needed for moderate pain. 40 tablet 1  . omeprazole (PRILOSEC) 40 MG capsule TAKE 1 CAPSULE BY MOUTH DAILY. FOR THREE WEEKS THEN AS  NEEDED. TAKE 30 MIN PRIOR TO LARGE MEAL (Patient taking differently: Take 40 mg by mouth as needed (30 MIN PRIOR TO LARGE MEAL). ) 90 capsule 1  . OXYGEN Inhale 3 L/min into the lungs continuous.    . sertraline (ZOLOFT) 100 MG tablet Take 1.5 tablets (150 mg total) by mouth daily. 135 tablet 3  . thiamine 100 MG tablet Take 1 tablet (100 mg total) by mouth daily. 30 tablet 0  . vitamin B-12 (CYANOCOBALAMIN) 1000 MCG tablet Take 1,000 mcg by mouth daily.    . vitamin E 400 UNIT capsule Take 400 Units by mouth daily.     Marland Kitchen buPROPion (WELLBUTRIN SR) 100 MG 12 hr tablet Take 1 tablet (100 mg total) by mouth daily. 90 tablet 1  . traZODone (DESYREL) 50 MG tablet Take  25 tablets by mouth at bedtime.    . budesonide (PULMICORT) 0.5 MG/2ML nebulizer solution Take 2 mLs (0.5 mg total) by nebulization in the morning and at bedtime. (Patient not taking: Reported on 11/09/2019) 120 mL 11  . doxycycline (VIBRA-TABS) 100 MG tablet Take 1 tablet (100 mg total) by mouth 2 (two) times daily. 14 tablet 0  . formoterol (PERFOROMIST) 20 MCG/2ML nebulizer solution Take 2 mLs (20 mcg total) by nebulization 2 (two) times daily. (Patient not taking: Reported on 11/09/2019) 2 mL 3  . predniSONE (DELTASONE) 20 MG tablet Take two tablets daily for 3 days followed by one tablet daily for 4 days 10 tablet 0  . Tiotropium Bromide Monohydrate (SPIRIVA RESPIMAT) 2.5 MCG/ACT AERS Inhale 2 puffs into the lungs daily. (Patient not taking: Reported on 11/09/2019) 4 g 11  . Tiotropium Bromide Monohydrate (SPIRIVA RESPIMAT) 2.5 MCG/ACT AERS Inhale 2 puffs into the lungs daily. (Patient not taking: Reported on 11/09/2019) 4 g 0   No facility-administered medications prior to visit.     Per HPI unless specifically indicated in ROS section below Review of Systems Objective:  BP 118/70 (BP Location: Right Arm, Patient Position: Sitting, Cuff Size: Normal)   Pulse (!) 106   Temp 98 F (36.7 C) (Temporal)   Ht 5\' 4"  (1.626 m)   Wt 107 lb 8 oz (48.8 kg)   SpO2 97% Comment: 4 L  BMI 18.45 kg/m   Wt Readings from Last 3 Encounters:  11/10/19 107 lb 8 oz (48.8 kg)  11/09/19 107 lb (48.5 kg)  10/20/19 111 lb 8 oz (50.6 kg)      Physical Exam Vitals and nursing note reviewed.  Constitutional:      Appearance: Normal appearance. She is not ill-appearing.  Musculoskeletal:        General: Normal range of motion.     Comments:  FROM at shoulders  No pain at shoulder bursae  Reproducible tenderness to palpation L rhomboid muscles  Neurological:     Mental Status: She is alert.  Psychiatric:        Mood and Affect: Mood normal.        Behavior: Behavior normal.       Assessment & Plan:   This visit occurred during the SARS-CoV-2 public health emergency.  Safety protocols were in place, including screening questions prior to the visit, additional usage of staff PPE, and extensive cleaning of exam room while observing appropriate contact time as indicated for disinfecting solutions.   Problem List Items Addressed This Visit    Rhomboid muscle strain, initial encounter - Primary    Story/exam consistent with rhomboid strain. Reviewed supportive care with heating pad, robaxin,  will provide with exercises from [facility] pt advisor. Update if not improving with treatment.       Palliative care status    Appreciate palliative care assistance. She would qualify for hospice given severe COPD. This scares her. Discussed.       Moderate recurrent major depression ([place])    Difficult time with progressive decline. Will continue sertraline 150mg , change wellbutrin SL 100mg  to XL 150mg  daily, and change trazodone 25mg  to mirtazapine 15mg  nightly for sleep and appetite. Update in 2-4 wks with effect.       Relevant Medications   buPROPion (WELLBUTRIN XL) 150 MG 24 hr tablet   mirtazapine (REMERON) 15 MG tablet   COPD, very severe ([facility])   Chronic respiratory failure with hypoxia, on home O2 therapy ([place])       Meds ordered this encounter  Medications  . buPROPion (WELLBUTRIN XL) 150 MG 24 hr tablet    Sig: Take 1 tablet (150 mg total) by mouth daily.    Dispense:  90 tablet    Refill:  1    To replace wellbutrin SR  . mirtazapine (REMERON) 15 MG tablet    Sig: Take 1 tablet (15 mg total) by mouth at bedtime.    Dispense:  30 tablet    Refill:  6    To replace trazodone  . methocarbamol (ROBAXIN) 500 MG tablet    Sig: Take 1 tablet (500 mg total) by mouth 3 (three) times daily as needed for muscle spasms (sedation precautions).    Dispense:  30 tablet    Refill:  0   No orders of the defined types were placed in this encounter.   Patient Instructions  Continue sertraline 150mg   daily. Let's change wellbutrin from SR 100mg  to XL 150mg  daily daily.  Change trazodone to remeron 15mg  daily - this will help sleep and may also increase appetite.  For left shoulder, I think you have rhomboid strain. I agree with muscle relaxant (with sedation precautions) and heating pad. Do exercises provided today.    Follow up plan: No follow-ups on file.  Ria Bush, MD

## 2019-11-10 NOTE — Assessment & Plan Note (Signed)
Appreciate palliative care assistance. She would qualify for hospice given severe COPD. This scares her. Discussed.

## 2019-11-10 NOTE — Patient Instructions (Addendum)
Continue sertraline 150mg  daily. Let's change wellbutrin from SR 100mg  to XL 150mg  daily daily.  Change trazodone to remeron 15mg  daily - this will help sleep and may also increase appetite.  For left shoulder, I think you have rhomboid strain. I agree with muscle relaxant (with sedation precautions) and heating pad. Do exercises provided today.

## 2019-11-10 NOTE — Assessment & Plan Note (Signed)
Story/exam consistent with rhomboid strain. Reviewed supportive care with heating pad, robaxin, will provide with exercises from [facility] pt advisor. Update if not improving with treatment.

## 2019-11-10 NOTE — Assessment & Plan Note (Signed)
Difficult time with progressive decline. Will continue sertraline 150mg , change wellbutrin SL 100mg  to XL 150mg  daily, and change trazodone 25mg  to mirtazapine 15mg  nightly for sleep and appetite. Update in 2-4 wks with effect.

## 2019-11-18 NOTE — Progress Notes (Signed)
Agree with the details of the encounter as per Tammy Barrett, NP .  C. Derrill Kay, MD [facility]

## 2019-11-24 ENCOUNTER — Telehealth: Payer: Self-pay | Admitting: Family Medicine

## 2019-11-24 NOTE — Telephone Encounter (Signed)
Patient just had a mobility evaluation 10/20/2019 - doesn't need another one. Why is she scheduled for Friday?

## 2019-11-25 NOTE — Telephone Encounter (Signed)
Plz cancel OV. I will work on new Rx.

## 2019-11-25 NOTE — Telephone Encounter (Signed)
Spoke to pt and canceled her appt. She said [facility] is supposed to be faxing something about the new rx.

## 2019-11-25 NOTE — Telephone Encounter (Signed)
Spoke to pt. She said [employer] advised her that the rx was more than 57 days old. She is unaware if she needed another F2F. She will need a new script. She said [employer] told her our office said she needed an OV.

## 2019-11-26 ENCOUNTER — Ambulatory Visit: Payer: Medicare HMO | Admitting: Family Medicine

## 2019-11-26 ENCOUNTER — Other Ambulatory Visit: Payer: Self-pay

## 2019-11-26 ENCOUNTER — Other Ambulatory Visit: Payer: Self-pay | Admitting: Adult Health Nurse Practitioner

## 2019-11-26 DIAGNOSIS — J9611 Chronic respiratory failure with hypoxia: Secondary | ICD-10-CM

## 2019-11-26 DIAGNOSIS — Z9981 Dependence on supplemental oxygen: Secondary | ICD-10-CM

## 2019-11-26 DIAGNOSIS — Z515 Encounter for palliative care: Secondary | ICD-10-CM

## 2019-11-27 NOTE — Progress Notes (Signed)
[facility] Consult Note Telephone: [phone]  Fax: [phone]  PATIENT NAME: Christine Serrano DOB: [birth year] MRN: [id number]  PRIMARY CARE PROVIDER:   Ria Bush, MD  REFERRING PROVIDER:  Ria Bush, MD [place],  [place] [address]  RESPONSIBLE PARTY:     ASSESSMENT:        RECOMMENDATIONS and PLAN:  1.  Advanced care planning.  Patient is a DNR.  2.  Severe COPD.  Patient states that she continues to have increasing dyspnea.  Patient is oxygen dependent at 4 L and will increase to 5 L with activity. She sleeps with head of bed elevated and with 2 pillows. States that she is requiring more help to take a shower.  She can dress herself but has to take frequent breaks due to dyspnea.  Patient is still working on getting powered wheelchair.  Has prescription with [facility] and has approval with [employer].  Due to inability to pay for nebulizer treatments patient has been switched back to her inhalers.  States that she is doing well with this.  Continue follow-up and recommendations by pulmonology.  3.  Depression.  Patient was seen by PCP who has increased her Wellbutrin from 100 mg to 150 mg daily, has stopped her trazodone and started her on Remeron 15 mg.  Patient expresses feeling less depressed and feels like the changes have been very beneficial.  States that her appetite has increased current weight is 107 pounds with BMI of 18.37.  Patient is in better spirits today than from last visit.  Continue with medication changes and monitor for continued effectiveness.  Palliative will continue to monitor for symptom management/decline and make recommendations as needed. Next appointment is in 4 weeks.  Encouraged to call with any questions or concerns  I spent 60 minutes providing this consultation,  from 1:00 to 2:00 including time spent with patient/family, chart review, provider coordination, documentation.  More than 50% of the time in this consultation was spent coordinating communication.   HISTORY OF PRESENT ILLNESS:  Christine Serrano is a 59 y.o. year old female with multiple medical problems including severe COPD, HLD, depression. Palliative Care was asked to help address goals of care.   CODE STATUS: DNR  PPS: 40% HOSPICE ELIGIBILITY/DIAGNOSIS: TBD  PHYSICAL EXAM:  BP  138/72  HR 97  O2 99% on 4L General: NAD, frail appearing, thin Cardiovascular: tachycardic; regular rate and rhythm Pulmonary: lung sounds diminished with poor air movement; normal respiratory effort Abdomen: soft, nontender, + bowel sounds GU: no suprapubic tenderness Extremities: no edema, no joint deformities Skin: no rashes on exposed skin Neurological: Weakness but otherwise nonfocal; is having more forgetfulness   PAST MEDICAL HISTORY:  Past Medical History:  Diagnosis Date  . Cervical cancer ([place])   . Chronic idiopathic constipation   . Community acquired pneumonia of right upper lobe of lung 12/10/2016  . COPD (chronic obstructive pulmonary disease) ([place])   . Depression   . Endometriosis   . HLD (hyperlipidemia)   . Smoker     SOCIAL HX:  Social History   Tobacco Use  . Smoking status: Current Every Day Smoker    Packs/day: 3.00    Years: 50.00    Pack years: 150.00    Types: Cigarettes  . Smokeless tobacco: Never Used  . Tobacco comment: 10/18/19 pt somkes 2 packs per day ARJ  Substance Use Topics  . Alcohol use: Yes    Comment: occasional  ALLERGIES:  Allergies  Allergen Reactions  . Terbinafine And Related Diarrhea    Diarrheal illness while on terbinafine ?related to med  . Imitrex [Sumatriptan] Other (See Comments)    Worsens migraine      PERTINENT MEDICATIONS:  Outpatient Encounter Medications as of 11/26/2019  Medication Sig  . albuterol (VENTOLIN HFA) 108 (90 Base) MCG/ACT inhaler Inhale 2 puffs into the lungs every 6 (six) hours as needed for wheezing or shortness of  breath.  Marland Kitchen aspirin EC 81 MG tablet Take 81 mg by mouth daily.  Marland Kitchen buPROPion (WELLBUTRIN XL) 150 MG 24 hr tablet Take 1 tablet (150 mg total) by mouth daily.  . Calcium Ascorbate 500 MG TABS Take 1 tablet (500 mg total) by mouth daily.  . fluticasone (FLONASE) 50 MCG/ACT nasal spray Place 2 sprays into both nostrils daily.  . folic acid (FOLVITE) 1 MG tablet Take 1 tablet (1 mg total) by mouth daily.  Marland Kitchen gabapentin (NEURONTIN) 100 MG capsule Take 1 capsule (100 mg total) by mouth 2 (two) times daily.  . hydrOXYzine (ATARAX/VISTARIL) 25 MG tablet TAKE 1 TABLET (25 MG TOTAL) BY MOUTH 2 (TWO) TIMES DAILY AS NEEDED FOR ANXIETY.  Marland Kitchen ipratropium-albuterol (DUONEB) 0.5-2.5 (3) MG/3ML SOLN Inhale 3 mLs into the lungs every 6 (six) hours as needed.  Marland Kitchen LINZESS 290 MCG CAPS capsule Take 1 capsule (290 mcg total) by mouth daily as needed (constipation).  . meclizine (ANTIVERT) 25 MG tablet Take 1 tablet (25 mg total) by mouth 3 (three) times daily as needed for dizziness.  . methocarbamol (ROBAXIN) 500 MG tablet Take 1 tablet (500 mg total) by mouth 3 (three) times daily as needed for muscle spasms (sedation precautions).  . mirtazapine (REMERON) 15 MG tablet Take 1 tablet (15 mg total) by mouth at bedtime.  . Multiple Vitamin (MULTIVITAMIN WITH MINERALS) TABS tablet Take 1 tablet by mouth daily.  . naproxen (NAPROSYN) 375 MG tablet Take 1 tablet (375 mg total) by mouth 2 (two) times daily as needed for moderate pain.  Marland Kitchen omeprazole (PRILOSEC) 40 MG capsule TAKE 1 CAPSULE BY MOUTH DAILY. FOR THREE WEEKS THEN AS NEEDED. TAKE 30 MIN PRIOR TO LARGE MEAL (Patient taking differently: Take 40 mg by mouth as needed (30 MIN PRIOR TO LARGE MEAL). )  . OXYGEN Inhale 3 L/min into the lungs continuous.  . sertraline (ZOLOFT) 100 MG tablet Take 1.5 tablets (150 mg total) by mouth daily.  Marland Kitchen thiamine 100 MG tablet Take 1 tablet (100 mg total) by mouth daily.  . vitamin B-12 (CYANOCOBALAMIN) 1000 MCG tablet Take 1,000 mcg by  mouth daily.  . vitamin E 400 UNIT capsule Take 400 Units by mouth daily.    No facility-administered encounter medications on file as of 11/26/2019.     Christine Serrano Jenetta Downer, NP

## 2019-12-01 ENCOUNTER — Telehealth: Payer: Self-pay | Admitting: Family Medicine

## 2019-12-01 NOTE — Telephone Encounter (Signed)
Patient called back. [facility] will provide a scooter under patient's insurance.  Patient needs a rx for Scooter sent to [facility]. [facility] phone number is [phone]- patient spoke to [facility] and fax number [phone]. Please call patient back with update.

## 2019-12-01 NOTE — Telephone Encounter (Signed)
Faxed rx.  Notified pt.  Verbalizes understanding and expresses her thanks.

## 2019-12-01 NOTE — Telephone Encounter (Signed)
Patient called in regards to receiving a motorized wheelchair   She stated that [facility] told her that her provider denied her having a motorized wheelchair but she was qualified for a scooter. Patient stated she did not understand why we denied the wheelchair that she is not able to walk and her insurance will not cover a scooter.   Please advise

## 2019-12-01 NOTE — Telephone Encounter (Signed)
Rx written and in my out box for scooter.

## 2019-12-03 ENCOUNTER — Other Ambulatory Visit: Payer: Self-pay | Admitting: Family Medicine

## 2019-12-03 NOTE — Telephone Encounter (Signed)
Last seen 11/10/19 Last refill 5/26/2   Rx. Mirtazapine 15 mg table... Please advise

## 2019-12-06 ENCOUNTER — Ambulatory Visit: Payer: Medicare HMO | Admitting: Family Medicine

## 2019-12-07 DIAGNOSIS — J449 Chronic obstructive pulmonary disease, unspecified: Secondary | ICD-10-CM | POA: Diagnosis not present

## 2019-12-09 ENCOUNTER — Other Ambulatory Visit: Payer: Self-pay | Admitting: Family Medicine

## 2019-12-10 ENCOUNTER — Encounter (HOSPITAL_COMMUNITY): Payer: Self-pay | Admitting: Emergency Medicine

## 2019-12-10 ENCOUNTER — Inpatient Hospital Stay (HOSPITAL_COMMUNITY)
Admit: 2019-12-10 | Discharge: 2019-12-17 | DRG: 190 | Disposition: A | Discharge status: Home / Self Care | Payer: Medicare HMO | Attending: Internal Medicine | Admitting: Internal Medicine

## 2019-12-10 ENCOUNTER — Emergency Department (HOSPITAL_COMMUNITY): Payer: Medicare HMO

## 2019-12-10 ENCOUNTER — Other Ambulatory Visit: Payer: Self-pay

## 2019-12-10 ENCOUNTER — Other Ambulatory Visit: Payer: Self-pay | Admitting: Family Medicine

## 2019-12-10 DIAGNOSIS — Z20822 Contact with and (suspected) exposure to covid-19: Secondary | ICD-10-CM | POA: Diagnosis not present

## 2019-12-10 DIAGNOSIS — D649 Anemia, unspecified: Secondary | ICD-10-CM | POA: Diagnosis present

## 2019-12-10 DIAGNOSIS — Z7982 Long term (current) use of aspirin: Secondary | ICD-10-CM | POA: Diagnosis not present

## 2019-12-10 DIAGNOSIS — J9600 Acute respiratory failure, unspecified whether with hypoxia or hypercapnia: Secondary | ICD-10-CM | POA: Diagnosis not present

## 2019-12-10 DIAGNOSIS — Z800 Family history of malignant neoplasm of digestive organs: Secondary | ICD-10-CM | POA: Diagnosis not present

## 2019-12-10 DIAGNOSIS — R21 Rash and other nonspecific skin eruption: Secondary | ICD-10-CM | POA: Diagnosis not present

## 2019-12-10 DIAGNOSIS — J80 Acute respiratory distress syndrome: Secondary | ICD-10-CM | POA: Diagnosis not present

## 2019-12-10 DIAGNOSIS — J439 Emphysema, unspecified: Secondary | ICD-10-CM | POA: Diagnosis not present

## 2019-12-10 DIAGNOSIS — Z8541 Personal history of malignant neoplasm of cervix uteri: Secondary | ICD-10-CM | POA: Diagnosis not present

## 2019-12-10 DIAGNOSIS — R0902 Hypoxemia: Secondary | ICD-10-CM | POA: Diagnosis not present

## 2019-12-10 DIAGNOSIS — J9601 Acute respiratory failure with hypoxia: Secondary | ICD-10-CM | POA: Diagnosis not present

## 2019-12-10 DIAGNOSIS — F329 Major depressive disorder, single episode, unspecified: Secondary | ICD-10-CM | POA: Diagnosis present

## 2019-12-10 DIAGNOSIS — Z79899 Other long term (current) drug therapy: Secondary | ICD-10-CM | POA: Diagnosis not present

## 2019-12-10 DIAGNOSIS — K5909 Other constipation: Secondary | ICD-10-CM | POA: Diagnosis present

## 2019-12-10 DIAGNOSIS — E785 Hyperlipidemia, unspecified: Secondary | ICD-10-CM | POA: Diagnosis present

## 2019-12-10 DIAGNOSIS — Z515 Encounter for palliative care: Secondary | ICD-10-CM | POA: Diagnosis not present

## 2019-12-10 DIAGNOSIS — R0602 Shortness of breath: Secondary | ICD-10-CM | POA: Diagnosis not present

## 2019-12-10 DIAGNOSIS — Z681 Body mass index (BMI) 19 or less, adult: Secondary | ICD-10-CM | POA: Diagnosis not present

## 2019-12-10 DIAGNOSIS — E871 Hypo-osmolality and hyponatremia: Secondary | ICD-10-CM | POA: Diagnosis not present

## 2019-12-10 DIAGNOSIS — Z825 Family history of asthma and other chronic lower respiratory diseases: Secondary | ICD-10-CM

## 2019-12-10 DIAGNOSIS — E440 Moderate protein-calorie malnutrition: Secondary | ICD-10-CM | POA: Diagnosis present

## 2019-12-10 DIAGNOSIS — J9621 Acute and chronic respiratory failure with hypoxia: Secondary | ICD-10-CM | POA: Diagnosis not present

## 2019-12-10 DIAGNOSIS — Z9981 Dependence on supplemental oxygen: Secondary | ICD-10-CM

## 2019-12-10 DIAGNOSIS — Z8249 Family history of ischemic heart disease and other diseases of the circulatory system: Secondary | ICD-10-CM | POA: Diagnosis not present

## 2019-12-10 DIAGNOSIS — F1010 Alcohol abuse, uncomplicated: Secondary | ICD-10-CM | POA: Diagnosis present

## 2019-12-10 DIAGNOSIS — F17210 Nicotine dependence, cigarettes, uncomplicated: Secondary | ICD-10-CM | POA: Diagnosis present

## 2019-12-10 DIAGNOSIS — R0689 Other abnormalities of breathing: Secondary | ICD-10-CM | POA: Diagnosis not present

## 2019-12-10 DIAGNOSIS — I10 Essential (primary) hypertension: Secondary | ICD-10-CM | POA: Diagnosis not present

## 2019-12-10 DIAGNOSIS — J441 Chronic obstructive pulmonary disease with (acute) exacerbation: Secondary | ICD-10-CM | POA: Diagnosis not present

## 2019-12-10 DIAGNOSIS — I4510 Unspecified right bundle-branch block: Secondary | ICD-10-CM | POA: Diagnosis not present

## 2019-12-10 LAB — SARS CORONAVIRUS 2 BY RT PCR (HOSPITAL ORDER, PERFORMED IN ~~LOC~~ HOSPITAL LAB): SARS Coronavirus 2: NEGATIVE

## 2019-12-10 LAB — CBC WITH DIFFERENTIAL/PLATELET
Abs Immature Granulocytes: 0.02 10*3/uL (ref 0.00–0.07)
Basophils Absolute: 0 10*3/uL (ref 0.0–0.1)
Basophils Relative: 1 %
Eosinophils Absolute: 0.3 10*3/uL (ref 0.0–0.5)
Eosinophils Relative: 3 %
HCT: 35.8 % — ABNORMAL LOW (ref 36.0–46.0)
Hemoglobin: 11.7 g/dL — ABNORMAL LOW (ref 12.0–15.0)
Immature Granulocytes: 0 %
Lymphocytes Relative: 38 %
Lymphs Abs: 2.7 10*3/uL (ref 0.7–4.0)
MCH: 28.6 pg (ref 26.0–34.0)
MCHC: 32.7 g/dL (ref 30.0–36.0)
MCV: 87.5 fL (ref 80.0–100.0)
Monocytes Absolute: 0.4 10*3/uL (ref 0.1–1.0)
Monocytes Relative: 5 %
Neutro Abs: 3.8 10*3/uL (ref 1.7–7.7)
Neutrophils Relative %: 53 %
Platelets: 188 10*3/uL (ref 150–400)
RBC: 4.09 MIL/uL (ref 3.87–5.11)
RDW: 13.2 % (ref 11.5–15.5)
WBC: 7.3 10*3/uL (ref 4.0–10.5)
nRBC: 0 % (ref 0.0–0.2)

## 2019-12-10 LAB — BASIC METABOLIC PANEL
Anion gap: 10 (ref 5–15)
BUN: <5 mg/dL — ABNORMAL LOW (ref 6–20)
CO2: 28 mmol/L (ref 22–32)
Calcium: 7.9 mg/dL — ABNORMAL LOW (ref 8.9–10.3)
Chloride: 94 mmol/L — ABNORMAL LOW (ref 98–111)
Creatinine, Ser: 0.52 mg/dL (ref 0.44–1.00)
GFR calc Af Amer: >60 mL/min (ref >60)
GFR calc non Af Amer: >60 mL/min (ref >60)
Glucose, Bld: 113 mg/dL — ABNORMAL HIGH (ref 70–99)
Potassium: 3.6 mmol/L (ref 3.5–5.1)
Sodium: 132 mmol/L — ABNORMAL LOW (ref 135–145)

## 2019-12-10 LAB — TROPONIN I (HIGH SENSITIVITY): Troponin I (High Sensitivity): 10 ng/L (ref <18)

## 2019-12-10 MED ORDER — ALBUTEROL SULFATE HFA 108 (90 BASE) MCG/ACT IN AERS
4.0000 | INHALATION_SPRAY | Freq: Once | RESPIRATORY_TRACT | Status: AC
  Administered 2019-12-10: 4 via RESPIRATORY_TRACT
  Filled 2019-12-10: qty 6.7

## 2019-12-10 MED ORDER — IPRATROPIUM BROMIDE HFA 17 MCG/ACT IN AERS
2.0000 | INHALATION_SPRAY | Freq: Once | RESPIRATORY_TRACT | Status: AC
  Administered 2019-12-10: 2 via RESPIRATORY_TRACT
  Filled 2019-12-10: qty 12.9

## 2019-12-10 NOTE — Telephone Encounter (Addendum)
Filled and in Lisa's box 

## 2019-12-10 NOTE — ED Triage Notes (Signed)
Patient arrives via [facility] with complaints of shortness of breath after her O2 tank ran out while she was fishing. Patient states she was without O2 for approximately 10 minutes. Patient is Stage IV COPD and wears 4L [place].

## 2019-12-10 NOTE — ED Provider Notes (Signed)
[facility] DEPT Provider Note   CSN: [id number] Arrival date & time: 12/10/19  1946     History Chief Complaint  Patient presents with  . Shortness of Breath    COPD    Christine Serrano is a 59 y.o. female.  She has a history of end-stage COPD and is on 4 L nasal cannula 24/7.  She was out fishing with a friend when apparently her oxygen canister ran out.  EMS was called and found her 70% tripoding not speaking in full sentences.  She received magnesium, Solu-Medrol, albuterol and oxygen with some improvement in her symptoms.  Still wheezing.  She denies any change in her chronic cough or shortness of breath.  No hemoptysis no fevers no chills.  Did not get her Covid vaccine.  Continues to smoke.  The history is provided by the patient and the EMS personnel.  Shortness of Breath Severity:  Severe Onset quality:  Gradual Progression:  Improving Chronicity:  Chronic Context: activity   Relieved by:  Nothing Worsened by:  Activity Ineffective treatments:  Inhaler Associated symptoms: cough and wheezing   Associated symptoms: no abdominal pain, no chest pain, no fever, no headaches, no hemoptysis, no neck pain, no rash, no sore throat, no syncope and no vomiting   Risk factors: tobacco use        Past Medical History:  Diagnosis Date  . Cervical cancer ([place])   . Chronic idiopathic constipation   . Community acquired pneumonia of right upper lobe of lung 12/10/2016  . COPD (chronic obstructive pulmonary disease) ([place])   . Depression   . Endometriosis   . HLD (hyperlipidemia)   . Smoker     Patient Active Problem List   Diagnosis Date Noted  . Rhomboid muscle strain, initial encounter 11/10/2019  . Palliative care status 10/23/2019  . Impaired mobility 10/20/2019  . Chronic respiratory failure with hypoxia, on home O2 therapy ([place]) 10/20/2019  . Memory difficulties 10/16/2019  . Bug bite 10/16/2019  . Folate deficiency 09/17/2019  .  Dizziness 09/11/2019  . Anemia 09/11/2019  . Hypocalcemia 09/11/2019  . Carotid stenosis, asymptomatic, left 04/06/2019  . Systolic murmur 45/36/4680  . Left carotid bruit 02/27/2019  . Hypervitaminosis D 02/27/2019  . Medicare annual wellness visit, subsequent 02/25/2019  . Advanced directives, counseling/discussion 02/25/2019  . Epigastric abdominal pain 09/21/2018  . Epistaxis 09/21/2018  . COPD exacerbation ([place]) 09/02/2018  . Osteoporosis 05/23/2018  . Numbness of right hand 10/10/2017  . Nasal sinus congestion 10/10/2017  . Benign neoplasm of ascending colon   . Benign neoplasm of transverse colon   . Benign neoplasm of sigmoid colon   . Positive hepatitis C antibody test 02/23/2017  . Health maintenance examination 02/21/2017  . HLD (hyperlipidemia) 02/21/2017  . Anxiety 12/10/2016  . Tobacco abuse 11/28/2016  . Aortic atherosclerosis ([place]) 11/26/2016  . Tachycardia 11/01/2016  . Pulmonary nodule 10/25/2016  . Moderate recurrent major depression ([place]) 07/03/2016  . Chronic constipation 07/03/2016  . Alcohol abuse, in remission 07/03/2016  . COPD, very severe ([place]) 07/03/2016  . Occasional tremors 07/03/2016  . Chronic insomnia 07/03/2016    Past Surgical History:  Procedure Laterality Date  . ABLATION ON ENDOMETRIOSIS    . APPENDECTOMY    . BREAST EXCISIONAL BIOPSY Left age 17   benign biopsy - chronic fatty deposit  . COLONOSCOPY WITH PROPOFOL N/A 08/04/2017   TAx3, angiodysplastic lesion, diverticulosis, rpt 5 yrs ([place])  . EXPLORATORY LAPAROTOMY  1992   endometriosis  .  FOOT SURGERY Right    needle imbeded  . LASER ABLATION CONDYLOMA CERVICAL / VULVAR    . NASAL SEPTUM SURGERY Bilateral   . OVARIAN CYST SURGERY Bilateral 1980   remote  . SALPINGOOPHORECTOMY Right 1983   ectopic pregnancy  . TONSILLECTOMY       OB History   No obstetric history on file.     Family History  Problem Relation Age of Onset  . Heart disease Mother   .  Hypertension Mother   . Hypertension Father   . COPD Father   . Hearing loss Maternal Grandmother   . Hypertension Maternal Grandmother   . Hearing loss Maternal Grandfather   . Hypertension Maternal Grandfather   . Colon cancer Paternal Grandmother     Social History   Tobacco Use  . Smoking status: Current Every Day Smoker    Packs/day: 3.00    Years: 50.00    Pack years: 150.00    Types: Cigarettes  . Smokeless tobacco: Never Used  . Tobacco comment: 10/18/19 pt somkes 2 packs per day ARJ  Vaping Use  . Vaping Use: Never used  Substance Use Topics  . Alcohol use: Yes    Comment: occasional  . Drug use: No    Home Medications Prior to Admission medications   Medication Sig Start Date End Date Taking? Authorizing Provider  mirtazapine (REMERON) 15 MG tablet TAKE 1 TABLET BY MOUTH EVERYDAY AT BEDTIME 12/03/19   Ria Bush, MD  albuterol (VENTOLIN HFA) 108 (90 Base) MCG/ACT inhaler INHALE 2 PUFFS INTO THE LUNGS EVERY 6  HOURS AS NEEDED FOR WHEEZING OR SHORTNESS OF BREATH. 12/09/19   Ria Bush, MD  aspirin EC 81 MG tablet Take 81 mg by mouth daily.    [provider]  buPROPion (WELLBUTRIN XL) 150 MG 24 hr tablet Take 1 tablet (150 mg total) by mouth daily. 11/10/19   Ria Bush, MD  Calcium Ascorbate 500 MG TABS Take 1 tablet (500 mg total) by mouth daily. 07/01/19   Ria Bush, MD  fluticasone [facility]) 50 MCG/ACT nasal spray Place 2 sprays into both nostrils daily. 07/12/19   Ria Bush, MD  folic acid (FOLVITE) 1 MG tablet Take 1 tablet (1 mg total) by mouth daily. 10/15/19   Ria Bush, MD  gabapentin (NEURONTIN) 100 MG capsule Take 1 capsule (100 mg total) by mouth 2 (two) times daily. 10/15/19   Ria Bush, MD  hydrOXYzine (ATARAX/VISTARIL) 25 MG tablet TAKE 1 TABLET (25 MG TOTAL) BY MOUTH 2 (TWO) TIMES DAILY AS NEEDED FOR ANXIETY. 10/02/19   Ria Bush, MD  ipratropium-albuterol (DUONEB) 0.5-2.5 (3) MG/3ML SOLN  Inhale 3 mLs into the lungs every 6 (six) hours as needed. 07/13/19   Ria Bush, MD  LINZESS 290 MCG CAPS capsule Take 1 capsule (290 mcg total) by mouth daily as needed (constipation). 07/13/19   Ria Bush, MD  meclizine (ANTIVERT) 25 MG tablet Take 1 tablet (25 mg total) by mouth 3 (three) times daily as needed for dizziness. 09/12/19   Aline August, MD  methocarbamol (ROBAXIN) 500 MG tablet Take 1 tablet (500 mg total) by mouth 3 (three) times daily as needed for muscle spasms (sedation precautions). 11/10/19   Ria Bush, MD  Multiple Vitamin (MULTIVITAMIN WITH MINERALS) TABS tablet Take 1 tablet by mouth daily. 09/12/19   Aline August, MD  naproxen (NAPROSYN) 375 MG tablet Take 1 tablet (375 mg total) by mouth 2 (two) times daily as needed for moderate pain. 10/10/19   Ria Bush,  MD  omeprazole (PRILOSEC) 40 MG capsule TAKE 1 CAPSULE BY MOUTH DAILY. FOR THREE WEEKS THEN AS NEEDED. TAKE 30 MIN PRIOR TO LARGE MEAL 12/09/19   Ria Bush, MD  OXYGEN Inhale 3 L/min into the lungs continuous.    [provider]  sertraline (ZOLOFT) 100 MG tablet Take 1.5 tablets (150 mg total) by mouth daily. 07/19/19   Ria Bush, MD  thiamine 100 MG tablet Take 1 tablet (100 mg total) by mouth daily. 09/12/19   Aline August, MD  vitamin B-12 (CYANOCOBALAMIN) 1000 MCG tablet Take 1,000 mcg by mouth daily.    [provider]  vitamin E 400 UNIT capsule Take 400 Units by mouth daily.     [provider]    Allergies    Terbinafine and related and Imitrex [sumatriptan]  Review of Systems   Review of Systems  Constitutional: Negative for fever.  HENT: Negative for sore throat.   Eyes: Negative for visual disturbance.  Respiratory: Positive for cough, shortness of breath and wheezing. Negative for hemoptysis.   Cardiovascular: Negative for chest pain and syncope.  Gastrointestinal: Negative for abdominal pain and vomiting.  Genitourinary:  Negative for dysuria.  Musculoskeletal: Negative for neck pain.  Skin: Negative for rash.  Neurological: Negative for headaches.    Physical Exam Updated Vital Signs BP 119/82   Pulse (!) 108   Temp 98.4 F (36.9 C) (Oral)   Resp 20   Ht 5' 4.5" (1.638 m)   Wt 48.5 kg   SpO2 97%   BMI 18.08 kg/m   Physical Exam Vitals and nursing note reviewed.  Constitutional:      General: She is not in acute distress.    Appearance: She is well-developed.  HENT:     Head: Normocephalic and atraumatic.  Eyes:     Conjunctiva/sclera: Conjunctivae normal.  Cardiovascular:     Rate and Rhythm: Regular rhythm. Tachycardia present.     Pulses: Normal pulses.     Heart sounds: No murmur heard.   Pulmonary:     Effort: Tachypnea and accessory muscle usage present. No respiratory distress.     Breath sounds: Wheezing present.  Abdominal:     Palpations: Abdomen is soft.     Tenderness: There is no abdominal tenderness.  Musculoskeletal:        General: Normal range of motion.     Cervical back: Neck supple.     Right lower leg: No tenderness. No edema.     Left lower leg: No tenderness. No edema.  Skin:    General: Skin is warm and dry.     Capillary Refill: Capillary refill takes less than 2 seconds.  Neurological:     General: No focal deficit present.     Mental Status: She is alert.     ED Results / Procedures / Treatments   Labs (all labs ordered are listed, but only abnormal results are displayed) Labs Reviewed  BASIC METABOLIC PANEL - Abnormal; Notable for the following components:      Result Value   Sodium 132 (*)    Chloride 94 (*)    Glucose, Bld 113 (*)    BUN <5 (*)    Calcium 7.9 (*)    All other components within normal limits  CBC WITH DIFFERENTIAL/PLATELET - Abnormal; Notable for the following components:   Hemoglobin 11.7 (*)    HCT 35.8 (*)    All other components within normal limits  CBC - Abnormal; Notable for the following components:  WBC 3.6  (*)    All other components within normal limits  COMPREHENSIVE METABOLIC PANEL - Abnormal; Notable for the following components:   Chloride 96 (*)    Glucose, Bld 176 (*)    Calcium 8.6 (*)    All other components within normal limits  SARS CORONAVIRUS 2 BY RT PCR [facility] ORDER, [place] LAB)  TROPONIN I (HIGH SENSITIVITY)    EKG EKG Interpretation  Date/Time:  Friday December 10 2019 20:05:07 EDT Ventricular Rate:  115 PR Interval:    QRS Duration: 111 QT Interval:  351 QTC Calculation: 486 R Axis:   -170 Text Interpretation: Sinus tachycardia RVH with secondary repolarization abnrm ST elevation, consider inferior injury Borderline prolonged QT interval similar to prior today Confirmed by Aletta Edouard [phone]) on 12/10/2019 8:19:12 PM   Radiology DG Chest Port 1 View  Result Date: 12/10/2019 CLINICAL DATA:  59 year old female with shortness of breath EXAM: PORTABLE CHEST 1 VIEW COMPARISON:  Chest radiograph dated 10/16/2019 and chest CT dated 10/13/2019. FINDINGS: Background of emphysema. Bilateral upper lobe nodular densities as seen on the prior CT of 10/13/2019, possibly scarring. Follow-up with CT as prior recommendation. No new consolidative changes. There is no pleural effusion or pneumothorax. The cardiac silhouette is within normal limits. There is prominence of the central pulmonary arteries suggestive of pulmonary hypertension. No acute osseous pathology. IMPRESSION: 1. No acute cardiopulmonary process. 2. Emphysema and bilateral upper lobe scarring. Follow-up as per recommendation of the prior CT. 3. Probable pulmonary hypertension. Electronically Signed   By: Anner Crete M.D.   On: 12/10/2019 20:20    Procedures Procedures (including critical care time)  Medications Ordered in ED Medications  aspirin EC tablet 81 mg (81 mg Oral Given 12/11/19 1017)  buPROPion (WELLBUTRIN XL) 24 hr tablet 150 mg (150 mg Oral Given 12/11/19 1011)    hydrOXYzine (ATARAX/VISTARIL) tablet 25 mg (has no administration in time range)  mirtazapine (REMERON) tablet 15 mg (has no administration in time range)  sertraline (ZOLOFT) tablet 150 mg (150 mg Oral Given 12/11/19 1011)  linaclotide (LINZESS) capsule 290 mcg (290 mcg Oral Given 12/11/19 0829)  meclizine (ANTIVERT) tablet 25 mg (has no administration in time range)  pantoprazole (PROTONIX) EC tablet 40 mg (40 mg Oral Given 12/11/19 1011)  gabapentin (NEURONTIN) capsule 100 mg (100 mg Oral Given 12/11/19 0830)  methocarbamol (ROBAXIN) tablet 500 mg (has no administration in time range)  ascorbic acid (VITAMIN C) tablet 500 mg (500 mg Oral Given 12/11/19 1017)  cholecalciferol (VITAMIN D3) tablet 1,000 Units (1,000 Units Oral Given 12/11/19 1011)  multivitamin with minerals tablet 1 tablet (1 tablet Oral Given 12/11/19 1011)  ondansetron (ZOFRAN) tablet 4 mg (has no administration in time range)    Or  ondansetron (ZOFRAN) injection 4 mg (has no administration in time range)  albuterol (PROVENTIL) (2.5 MG/3ML) 0.083% nebulizer solution 2.5 mg (has no administration in time range)  methylPREDNISolone sodium succinate (SOLU-MEDROL) 40 mg/mL injection 40 mg (40 mg Intravenous Given 12/11/19 0155)  budesonide (PULMICORT) nebulizer solution 0.25 mg (0.25 mg Nebulization Given 12/11/19 0842)  ipratropium-albuterol (DUONEB) 0.5-2.5 (3) MG/3ML nebulizer solution 3 mL (3 mLs Nebulization Given 12/11/19 0842)  doxycycline (VIBRA-TABS) tablet 100 mg (has no administration in time range)  nicotine (NICODERM CQ - dosed in mg/24 hours) patch 21 mg (21 mg Transdermal Patch Applied 12/11/19 1012)  albuterol (VENTOLIN HFA) 108 (90 Base) MCG/ACT inhaler 4 puff (4 puffs Inhalation Given 12/10/19 2049)  ipratropium (ATROVENT HFA) inhaler 2  puff (2 puffs Inhalation Given 12/10/19 2049)    ED Course  I have reviewed the triage vital signs and the nursing notes.  Pertinent labs & imaging results that were available during  my care of the patient were reviewed by me and considered in my medical decision making (see chart for details).  Clinical Course as of Dec 10 1112  Fri Dec 10, 2019  2016 Chest x-ray interpreted by me as COPD, no pneumothorax, no gross infiltrates.   [MB]  2100 Discussed with Dr. Hal Hope [facility] hospitalist will evaluate the patient for admission.   [MB]    Clinical Course User Index [MB] Hayden Rasmussen, MD   MDM Rules/Calculators/A&P                         This patient complains of shortness of breath; this involves an extensive number of treatment Options and is a complaint that carries with it a high risk of complications and Morbidity. The differential includes COPD, ACS, PE, pneumothorax, pneumonia, anemia  I ordered, reviewed and interpreted labs, which included CBC with normal white count, slightly low hemoglobin, chemistries with elevated glucose, troponin unremarkable, Covid testing negative I ordered medication inhalational treatments I ordered imaging studies which included chest x-ray and I independently    visualized and interpreted imaging which showed COPD, no gross infiltrates Previous records obtained and reviewed in epic including last pulmonology note I consulted [facility] hospitalist Dr. Hal Hope and discussed lab and imaging findings   After the interventions stated above, I reevaluated the patient and found her to be with improved work of breathing.  She still need to be admitted for continued steroids and inhalation treatment.  Christine Serrano was evaluated in Emergency Department on 12/10/2019 for the symptoms described in the history of present illness. She was evaluated in the context of the global COVID-19 pandemic, which necessitated consideration that the patient might be at risk for infection with the SARS-CoV-2 virus that causes COVID-19. Institutional protocols and algorithms that pertain to the evaluation of patients at risk for COVID-19 are in a state  of rapid change based on information released by regulatory bodies including the CDC and federal and state organizations. These policies and algorithms were followed during the patient's care in the ED.   Final Clinical Impression(s) / ED Diagnoses Final diagnoses:  COPD exacerbation [facility])    Rx / DC Orders ED Discharge Orders    None       Hayden Rasmussen, MD 12/11/19 1116

## 2019-12-10 NOTE — Telephone Encounter (Signed)
Received faxed Standard Written Order form from [facility].  Placed order in Dr. Synthia Innocent box.

## 2019-12-11 DIAGNOSIS — J9601 Acute respiratory failure with hypoxia: Secondary | ICD-10-CM | POA: Diagnosis present

## 2019-12-11 LAB — CBC
HCT: 38.6 % (ref 36.0–46.0)
Hemoglobin: 12.2 g/dL (ref 12.0–15.0)
MCH: 27.6 pg (ref 26.0–34.0)
MCHC: 31.6 g/dL (ref 30.0–36.0)
MCV: 87.3 fL (ref 80.0–100.0)
Platelets: 203 10*3/uL (ref 150–400)
RBC: 4.42 MIL/uL (ref 3.87–5.11)
RDW: 13.2 % (ref 11.5–15.5)
WBC: 3.6 10*3/uL — ABNORMAL LOW (ref 4.0–10.5)
nRBC: 0 % (ref 0.0–0.2)

## 2019-12-11 LAB — COMPREHENSIVE METABOLIC PANEL
ALT: 12 U/L (ref 0–44)
AST: 24 U/L (ref 15–41)
Albumin: 4 g/dL (ref 3.5–5.0)
Alkaline Phosphatase: 79 U/L (ref 38–126)
Anion gap: 11 (ref 5–15)
BUN: 8 mg/dL (ref 6–20)
CO2: 30 mmol/L (ref 22–32)
Calcium: 8.6 mg/dL — ABNORMAL LOW (ref 8.9–10.3)
Chloride: 96 mmol/L — ABNORMAL LOW (ref 98–111)
Creatinine, Ser: 0.66 mg/dL (ref 0.44–1.00)
GFR calc Af Amer: >60 mL/min (ref >60)
GFR calc non Af Amer: >60 mL/min (ref >60)
Glucose, Bld: 176 mg/dL — ABNORMAL HIGH (ref 70–99)
Potassium: 4.3 mmol/L (ref 3.5–5.1)
Sodium: 137 mmol/L (ref 135–145)
Total Bilirubin: 0.4 mg/dL (ref 0.3–1.2)
Total Protein: 6.9 g/dL (ref 6.5–8.1)

## 2019-12-11 LAB — D-DIMER, QUANTITATIVE: D-Dimer, Quant: 0.69 ug/mL-FEU — ABNORMAL HIGH (ref 0.00–0.50)

## 2019-12-11 MED ORDER — ONDANSETRON HCL 4 MG PO TABS: 4.0000 mg | ORAL_TABLET | Freq: Four times a day (QID) | ORAL | Status: DC | PRN

## 2019-12-11 MED ORDER — LORAZEPAM 1 MG PO TABS: 0.0000 mg | ORAL_TABLET | Freq: Four times a day (QID) | ORAL | Status: DC

## 2019-12-11 MED ORDER — HYDROXYZINE HCL 25 MG PO TABS
25.0000 mg | ORAL_TABLET | Freq: Two times a day (BID) | ORAL | Status: DC | PRN
  Administered 2019-12-12 – 2019-12-16 (×4): 25 mg via ORAL
  Filled 2019-12-11 (×4): qty 1

## 2019-12-11 MED ORDER — LORAZEPAM 1 MG PO TABS: 1.0000 mg | ORAL_TABLET | ORAL | Status: DC | PRN

## 2019-12-11 MED ORDER — DOXYCYCLINE HYCLATE 100 MG PO TABS
100.0000 mg | ORAL_TABLET | Freq: Two times a day (BID) | ORAL | Status: AC
  Administered 2019-12-11 – 2019-12-15 (×9): 100 mg via ORAL
  Filled 2019-12-11 (×9): qty 1

## 2019-12-11 MED ORDER — NICOTINE 21 MG/24HR TD PT24
21.0000 mg | MEDICATED_PATCH | Freq: Every day | TRANSDERMAL | Status: DC
  Administered 2019-12-11 – 2019-12-17 (×7): 21 mg via TRANSDERMAL
  Filled 2019-12-11 (×7): qty 1

## 2019-12-11 MED ORDER — GABAPENTIN 100 MG PO CAPS
100.0000 mg | ORAL_CAPSULE | Freq: Two times a day (BID) | ORAL | Status: DC
  Administered 2019-12-11 – 2019-12-17 (×13): 100 mg via ORAL
  Filled 2019-12-11 (×13): qty 1

## 2019-12-11 MED ORDER — ADULT MULTIVITAMIN W/MINERALS CH
1.0000 | ORAL_TABLET | Freq: Every day | ORAL | Status: DC
  Administered 2019-12-11 – 2019-12-17 (×7): 1 via ORAL
  Filled 2019-12-11 (×7): qty 1

## 2019-12-11 MED ORDER — ASCORBIC ACID 500 MG PO TABS
500.0000 mg | ORAL_TABLET | Freq: Every day | ORAL | Status: DC
  Administered 2019-12-11 – 2019-12-17 (×7): 500 mg via ORAL
  Filled 2019-12-11 (×7): qty 1

## 2019-12-11 MED ORDER — ALBUTEROL SULFATE (2.5 MG/3ML) 0.083% IN NEBU
2.5000 mg | INHALATION_SOLUTION | RESPIRATORY_TRACT | Status: DC
  Administered 2019-12-11: 2.5 mg via RESPIRATORY_TRACT
  Filled 2019-12-11: qty 3

## 2019-12-11 MED ORDER — SODIUM CHLORIDE 0.9 % IV SOLN
100.0000 mg | Freq: Two times a day (BID) | INTRAVENOUS | Status: DC
  Administered 2019-12-11: 100 mg via INTRAVENOUS
  Filled 2019-12-11: qty 100

## 2019-12-11 MED ORDER — FOLIC ACID 1 MG PO TABS: 1.0000 mg | ORAL_TABLET | Freq: Every day | ORAL | Status: DC

## 2019-12-11 MED ORDER — ONDANSETRON HCL 4 MG/2ML IJ SOLN: 4.0000 mg | Freq: Four times a day (QID) | INTRAMUSCULAR | Status: DC | PRN

## 2019-12-11 MED ORDER — ENOXAPARIN SODIUM 40 MG/0.4ML ~~LOC~~ SOLN: 40.0000 mg | SUBCUTANEOUS | Status: DC

## 2019-12-11 MED ORDER — LORAZEPAM 2 MG/ML IJ SOLN: 1.0000 mg | INTRAMUSCULAR | Status: DC | PRN

## 2019-12-11 MED ORDER — SERTRALINE HCL 50 MG PO TABS
150.0000 mg | ORAL_TABLET | Freq: Every day | ORAL | Status: DC
  Administered 2019-12-11 – 2019-12-17 (×7): 150 mg via ORAL
  Filled 2019-12-11 (×2): qty 1
  Filled 2019-12-11: qty 3
  Filled 2019-12-11 (×4): qty 1

## 2019-12-11 MED ORDER — METHYLPREDNISOLONE SODIUM SUCC 40 MG IJ SOLR
40.0000 mg | Freq: Two times a day (BID) | INTRAMUSCULAR | Status: DC
  Administered 2019-12-11 – 2019-12-12 (×3): 40 mg via INTRAVENOUS
  Filled 2019-12-11 (×4): qty 1

## 2019-12-11 MED ORDER — VITAMIN D 25 MCG (1000 UNIT) PO TABS
1000.0000 [IU] | ORAL_TABLET | Freq: Every day | ORAL | Status: DC
  Administered 2019-12-11 – 2019-12-17 (×7): 1000 [IU] via ORAL
  Filled 2019-12-11 (×7): qty 1

## 2019-12-11 MED ORDER — ALBUTEROL SULFATE (2.5 MG/3ML) 0.083% IN NEBU: 2.5000 mg | INHALATION_SOLUTION | RESPIRATORY_TRACT | Status: DC | PRN

## 2019-12-11 MED ORDER — BUDESONIDE 0.25 MG/2ML IN SUSP
0.2500 mg | Freq: Two times a day (BID) | RESPIRATORY_TRACT | Status: DC
  Administered 2019-12-11 – 2019-12-15 (×9): 0.25 mg via RESPIRATORY_TRACT
  Filled 2019-12-11 (×9): qty 2

## 2019-12-11 MED ORDER — LORAZEPAM 1 MG PO TABS: 0.0000 mg | ORAL_TABLET | Freq: Two times a day (BID) | ORAL | Status: DC

## 2019-12-11 MED ORDER — ASPIRIN EC 81 MG PO TBEC
81.0000 mg | DELAYED_RELEASE_TABLET | Freq: Every day | ORAL | Status: DC
  Administered 2019-12-11 – 2019-12-17 (×7): 81 mg via ORAL
  Filled 2019-12-11 (×7): qty 1

## 2019-12-11 MED ORDER — THIAMINE HCL 100 MG PO TABS: 100.0000 mg | ORAL_TABLET | Freq: Every day | ORAL | Status: DC

## 2019-12-11 MED ORDER — MECLIZINE HCL 25 MG PO TABS: 25.0000 mg | ORAL_TABLET | Freq: Three times a day (TID) | ORAL | Status: DC | PRN

## 2019-12-11 MED ORDER — THIAMINE HCL 100 MG/ML IJ SOLN: 100.0000 mg | Freq: Every day | INTRAMUSCULAR | Status: DC

## 2019-12-11 MED ORDER — BUPROPION HCL ER (XL) 150 MG PO TB24
150.0000 mg | ORAL_TABLET | Freq: Every day | ORAL | Status: DC
  Administered 2019-12-11 – 2019-12-17 (×7): 150 mg via ORAL
  Filled 2019-12-11 (×7): qty 1

## 2019-12-11 MED ORDER — PANTOPRAZOLE SODIUM 40 MG PO TBEC
40.0000 mg | DELAYED_RELEASE_TABLET | Freq: Every day | ORAL | Status: DC
  Administered 2019-12-11 – 2019-12-17 (×7): 40 mg via ORAL
  Filled 2019-12-11 (×7): qty 1

## 2019-12-11 MED ORDER — METHOCARBAMOL 500 MG PO TABS
500.0000 mg | ORAL_TABLET | Freq: Three times a day (TID) | ORAL | Status: DC | PRN
  Administered 2019-12-15 – 2019-12-16 (×2): 500 mg via ORAL
  Filled 2019-12-11 (×2): qty 1

## 2019-12-11 MED ORDER — MIRTAZAPINE 15 MG PO TABS
15.0000 mg | ORAL_TABLET | Freq: Every day | ORAL | Status: DC
  Administered 2019-12-11 – 2019-12-16 (×6): 15 mg via ORAL
  Filled 2019-12-11 (×6): qty 1

## 2019-12-11 MED ORDER — IPRATROPIUM BROMIDE 0.02 % IN SOLN
0.5000 mg | RESPIRATORY_TRACT | Status: DC
  Administered 2019-12-11: 0.5 mg via RESPIRATORY_TRACT
  Filled 2019-12-11: qty 2.5

## 2019-12-11 MED ORDER — IPRATROPIUM-ALBUTEROL 0.5-2.5 (3) MG/3ML IN SOLN
3.0000 mL | Freq: Four times a day (QID) | RESPIRATORY_TRACT | Status: DC
  Administered 2019-12-11 – 2019-12-12 (×6): 3 mL via RESPIRATORY_TRACT
  Filled 2019-12-11 (×6): qty 3

## 2019-12-11 MED ORDER — LINACLOTIDE 145 MCG PO CAPS
290.0000 ug | ORAL_CAPSULE | Freq: Every day | ORAL | Status: DC
  Administered 2019-12-11 – 2019-12-17 (×7): 290 ug via ORAL
  Filled 2019-12-11 (×8): qty 2

## 2019-12-11 NOTE — Progress Notes (Signed)
PROGRESS NOTE  Brief Narrative: Christine Serrano is a 59 y.o. female with a history of 4L O2-dependent COPD, tobacco use (>100 pack years, currently 1.5-2 ppd), alcohol abuse currently in remission, and depression who presented to the ED 6/25 with dyspnea, wheezing, cough typical of previous COPD exacerbations. CXR showed emphysema with some scarring but no infiltrate, SARS-CoV-2 negative. IV steroids, doxycycline, and nebulized breathing treatments have been started with some improvement. Due to continued dyspnea at rest and wheezing, the patient has been admitted.  Subjective: Overall better, still wheezing with cough more than baseline, chest tightness, and shortness of breath at rest. Eating well. Reports drinking about 1 drink per week, though used to drink a significant amount of [employer], she stopped years ago.   Objective: BP (!) 144/110   Pulse (!) 117   Temp 98.4 F (36.9 C) (Oral)   Resp (!) 22   Ht 5' 4.5" (1.638 m)   Wt 48.5 kg   SpO2 95%   BMI 18.08 kg/m   Gen: Thin female in no distress Pulm: Nonlabored tachypnea with pan-expiratory wheezing (due to breathing Tx) CV: Regular tachycardia without M/R/G, no edema. Neuro: Alert and oriented. No focal deficits.  Assessment & Plan: Acute COPD exacerbation with chronic hypoxemic respiratory failure: O2 requirement is unchanged from baseline, though work of breathing has been elevated and exam remains abnormal. - Continue solumedrol 40mg  q12h until wheezing improved - Continue doxycycline x5 days - Continue scheduled and prn breathing treatments.   Tobacco use: Long-standing history.  - Cessation counseling provided - Nicotine patch 21 mg ordered - Low dose lung CT from April 2021 reviewed with patient at bedside, Lung-RADS 3s. Urged her to follow up again in late October (6 months).  Sinus tachycardia: Troponin negative. - Noted, will monitor telemetry. If becomes symptomatic, consider change to  levalbuterol.  Hypocalcemia: Possibly due to chronic metabolic alkalosis, improving. Albumin wnl.  Depression: Mood stable - Continue wellbutrin, remeron.   Patrecia Pour, MD Pager on amion 12/11/2019, 8:45 AM

## 2019-12-11 NOTE — H&P (Signed)
History and Physical    Christine Serrano:[id number] DOB: 10-09-1960 DOA: 12/10/2019  PCP: Ria Bush, MD  Patient coming from: Home.  Chief Complaint: Shortness of breath.  HPI: Christine Serrano is a 59 y.o. female with history of chronic respiratory failure on home oxygen secondary to COPD who presented with ongoing tobacco abuse and alcohol abuse depression presents to the ER with complaints of worsening shortness of breath over the last 24 hours with increasing productive cough. Denies any chest pain fever or chills. Shortness of breath is present even at rest typical of a COPD exacerbation.  ED Course: The ER patient was tachycardic hypoxic requiring 3 L oxygen with chest x-ray not showing any infiltrates Covid test negative. On exam patient is finding it difficult to complete sentences and has bilateral expiratory wheeze. Admitted for further management of acute respiratory failure with hypoxia secondary COPD exacerbation. Patient was given nebulizer treatment in the ER along with steroids. Labs are largely unremarkable except for anemia which is chronic. Sodium is 132 and calcium is 7.9 for which we will need to check the albumin levels.  Review of Systems: As per HPI, rest all negative.   Past Medical History:  Diagnosis Date  . Cervical cancer ([place])   . Chronic idiopathic constipation   . Community acquired pneumonia of right upper lobe of lung 12/10/2016  . COPD (chronic obstructive pulmonary disease) ([place])   . Depression   . Endometriosis   . HLD (hyperlipidemia)   . Smoker     Past Surgical History:  Procedure Laterality Date  . ABLATION ON ENDOMETRIOSIS    . APPENDECTOMY    . BREAST EXCISIONAL BIOPSY Left age 19   benign biopsy - chronic fatty deposit  . COLONOSCOPY WITH PROPOFOL N/A 08/04/2017   TAx3, angiodysplastic lesion, diverticulosis, rpt 5 yrs ([place])  . EXPLORATORY LAPAROTOMY  1992   endometriosis  . FOOT SURGERY Right    needle imbeded  .  LASER ABLATION CONDYLOMA CERVICAL / VULVAR    . NASAL SEPTUM SURGERY Bilateral   . OVARIAN CYST SURGERY Bilateral 1980   remote  . SALPINGOOPHORECTOMY Right 1983   ectopic pregnancy  . TONSILLECTOMY       reports that she has been smoking cigarettes. She has a 150.00 pack-year smoking history. She has never used smokeless tobacco. She reports current alcohol use. She reports that she does not use drugs.  Allergies  Allergen Reactions  . Terbinafine And Related Diarrhea    Diarrheal illness while on terbinafine ?related to med  . Imitrex [Sumatriptan] Other (See Comments)    Worsens migraine     Family History  Problem Relation Age of Onset  . Heart disease Mother   . Hypertension Mother   . Hypertension Father   . COPD Father   . Hearing loss Maternal Grandmother   . Hypertension Maternal Grandmother   . Hearing loss Maternal Grandfather   . Hypertension Maternal Grandfather   . Colon cancer Paternal Grandmother     Prior to Admission medications   Medication Sig Start Date End Date Taking? Authorizing Provider  albuterol (VENTOLIN HFA) 108 (90 Base) MCG/ACT inhaler INHALE 2 PUFFS INTO THE LUNGS EVERY 6  HOURS AS NEEDED FOR WHEEZING OR SHORTNESS OF BREATH. Patient taking differently: Inhale 2 puffs into the lungs every 4 (four) hours as needed for wheezing or shortness of breath.  12/09/19  Yes Ria Bush, MD  Ascorbic Acid (VITAMIN C WITH ROSE HIPS) 500 MG tablet Take 500 mg  by mouth daily.   Yes [provider]  aspirin EC 81 MG tablet Take 81 mg by mouth daily.   Yes [provider]  buPROPion (WELLBUTRIN XL) 150 MG 24 hr tablet Take 1 tablet (150 mg total) by mouth daily. 11/10/19  Yes Ria Bush, MD  cholecalciferol (VITAMIN D3) 25 MCG (1000 UNIT) tablet Take 1,000 Units by mouth daily.   Yes [provider]  fluticasone (FLONASE) 50 MCG/ACT nasal spray Place 2 sprays into both nostrils daily. Patient taking differently: Place 2  sprays into both nostrils daily as needed for allergies or rhinitis.  07/12/19  Yes Ria Bush, MD  folic acid (FOLVITE) 1 MG tablet Take 1 tablet (1 mg total) by mouth daily. 10/15/19  Yes Ria Bush, MD  gabapentin (NEURONTIN) 100 MG capsule Take 1 capsule (100 mg total) by mouth 2 (two) times daily. 10/15/19  Yes Ria Bush, MD  hydrOXYzine (ATARAX/VISTARIL) 25 MG tablet TAKE 1 TABLET (25 MG TOTAL) BY MOUTH 2 (TWO) TIMES DAILY AS NEEDED FOR ANXIETY. Patient taking differently: Take 25 mg by mouth 2 (two) times daily as needed for anxiety.  10/02/19  Yes Ria Bush, MD  ipratropium-albuterol (DUONEB) 0.5-2.5 (3) MG/3ML SOLN Inhale 3 mLs into the lungs every 6 (six) hours as needed. Patient taking differently: Inhale 3 mLs into the lungs every 6 (six) hours as needed (wheezing).  07/13/19  Yes Ria Bush, MD  LINZESS 290 MCG CAPS capsule Take 1 capsule (290 mcg total) by mouth daily as needed (constipation). Patient taking differently: Take 290 mcg by mouth daily before breakfast.  07/13/19  Yes Ria Bush, MD  meclizine (ANTIVERT) 25 MG tablet Take 1 tablet (25 mg total) by mouth 3 (three) times daily as needed for dizziness. 09/12/19  Yes Aline August, MD  methocarbamol (ROBAXIN) 500 MG tablet Take 1 tablet (500 mg total) by mouth 3 (three) times daily as needed for muscle spasms (sedation precautions). 11/10/19  Yes Ria Bush, MD  mirtazapine (REMERON) 15 MG tablet TAKE 1 TABLET BY MOUTH EVERYDAY AT BEDTIME Patient taking differently: Take 15 mg by mouth at bedtime.  12/03/19  Yes Ria Bush, MD  Multiple Vitamin (MULTIVITAMIN WITH MINERALS) TABS tablet Take 1 tablet by mouth daily. 09/12/19  Yes Aline August, MD  naproxen (NAPROSYN) 375 MG tablet Take 1 tablet (375 mg total) by mouth 2 (two) times daily as needed for moderate pain. 10/10/19  Yes Ria Bush, MD  omeprazole (PRILOSEC) 40 MG capsule TAKE 1 CAPSULE BY MOUTH DAILY. FOR THREE  WEEKS THEN AS NEEDED. TAKE 30 MIN PRIOR TO LARGE MEAL Patient taking differently: Take 40 mg by mouth daily.  12/09/19  Yes Ria Bush, MD  sertraline (ZOLOFT) 100 MG tablet Take 1.5 tablets (150 mg total) by mouth daily. 07/19/19  Yes Ria Bush, MD  SPIRIVA HANDIHALER 18 MCG inhalation capsule Place 1 capsule into inhaler and inhale daily.  12/10/19  Yes [provider]  SYMBICORT 160-4.5 MCG/ACT inhaler Inhale 2 puffs into the lungs in the morning and at bedtime.  12/10/19  Yes [provider]  thiamine 100 MG tablet Take 1 tablet (100 mg total) by mouth daily. 09/12/19  Yes Aline August, MD  Calcium Ascorbate 500 MG TABS Take 1 tablet (500 mg total) by mouth daily. Patient not taking: Reported on 12/10/2019 07/01/19   Ria Bush, MD  OXYGEN Inhale 3 L/min into the lungs continuous.    [provider]    Physical Exam: Constitutional: Moderately built and nourished. Vitals:  12/10/19 2018 12/10/19 2030 12/10/19 2130 12/10/19 2228  BP: 118/73 109/77 131/83 107/81  Pulse: (!) 119 (!) 113 (!) 116 (!) 120  Resp: (!) 25 15 (!) 31 (!) 32  Temp: 98.4 F (36.9 C)     TempSrc: Oral     SpO2: 92% 96% 95% 95%   Eyes: Anicteric no pallor. ENMT: No discharge from the ears eyes nose or mouth. Neck: No mass felt. No neck rigidity. Respiratory: Bilateral expiratory wheeze and no crepitations. Cardiovascular: S1-S2 heard. Abdomen: Soft nontender bowel sounds present. Musculoskeletal: No edema. Skin: No rash. Neurologic: Alert awake oriented to time place and person. Moves all extremities. Psychiatric: Appears normal. Normal affect.   Labs on Admission: I have personally reviewed following labs and imaging studies  CBC: Recent Labs  Lab 12/10/19 2004  WBC 7.3  NEUTROABS 3.8  HGB 11.7*  HCT 35.8*  MCV 87.5  PLT 824   Basic Metabolic Panel: Recent Labs  Lab 12/10/19 2004  NA 132*  K 3.6  CL 94*  CO2 28  GLUCOSE 113*  BUN <5*    CREATININE 0.52  CALCIUM 7.9*   GFR: CrCl cannot be calculated (Unknown ideal weight.). Liver Function Tests: No results for input(s): AST, ALT, ALKPHOS, BILITOT, PROT, ALBUMIN in the last 168 hours. No results for input(s): LIPASE, AMYLASE in the last 168 hours. No results for input(s): AMMONIA in the last 168 hours. Coagulation Profile: No results for input(s): INR, PROTIME in the last 168 hours. Cardiac Enzymes: No results for input(s): CKTOTAL, CKMB, CKMBINDEX, TROPONINI in the last 168 hours. BNP (last 3 results) No results for input(s): PROBNP in the last 8760 hours. HbA1C: No results for input(s): HGBA1C in the last 72 hours. CBG: No results for input(s): GLUCAP in the last 168 hours. Lipid Profile: No results for input(s): CHOL, HDL, LDLCALC, TRIG, CHOLHDL, LDLDIRECT in the last 72 hours. Thyroid Function Tests: No results for input(s): TSH, T4TOTAL, FREET4, T3FREE, THYROIDAB in the last 72 hours. Anemia Panel: No results for input(s): VITAMINB12, FOLATE, FERRITIN, TIBC, IRON, RETICCTPCT in the last 72 hours. Urine analysis:    Component Value Date/Time   COLORURINE STRAW (A) 09/11/2019 0224   APPEARANCEUR CLEAR 09/11/2019 0224   LABSPEC 1.002 (L) 09/11/2019 0224   PHURINE 6.0 09/11/2019 0224   GLUCOSEU NEGATIVE 09/11/2019 0224   HGBUR SMALL (A) 09/11/2019 0224   BILIRUBINUR NEGATIVE 09/11/2019 0224   KETONESUR NEGATIVE 09/11/2019 0224   PROTEINUR NEGATIVE 09/11/2019 0224   NITRITE NEGATIVE 09/11/2019 0224   LEUKOCYTESUR NEGATIVE 09/11/2019 0224   Sepsis Labs: @LABRCNTIP (procalcitonin:4,lacticidven:4) ) Recent Results (from the past 240 hour(s))  SARS Coronavirus 2 by RT PCR (hospital order, performed in [facility] hospital lab) Nasopharyngeal Nasopharyngeal Swab     Status: None   Collection Time: 12/10/19  8:05 PM   Specimen: Nasopharyngeal Swab  Result Value Ref Range Status   SARS Coronavirus 2 NEGATIVE NEGATIVE Final    Comment: (NOTE) SARS-CoV-2  target nucleic acids are NOT DETECTED.  The SARS-CoV-2 RNA is generally detectable in upper and lower respiratory specimens during the acute phase of infection. The lowest concentration of SARS-CoV-2 viral copies this assay can detect is 250 copies / mL. A negative result does not preclude SARS-CoV-2 infection and should not be used as the sole basis for treatment or other patient management decisions.  A negative result may occur with improper specimen collection / handling, submission of specimen other than nasopharyngeal swab, presence of viral mutation(s) within the areas targeted by this assay,  and inadequate number of viral copies (<250 copies / mL). A negative result must be combined with clinical observations, patient history, and epidemiological information.  Fact Sheet for Patients:   StrictlyIdeas.no  Fact Sheet for Healthcare Providers: BankingDealers.co.za  This test is not yet approved or  cleared by the [place] FDA and has been authorized for detection and/or diagnosis of SARS-CoV-2 by FDA under an Emergency Use Authorization (EUA).  This EUA will remain in effect (meaning this test can be used) for the duration of the COVID-19 declaration under Section 564(b)(1) of the Act, 21 U.S.C. section 360bbb-3(b)(1), unless the authorization is terminated or revoked sooner.  Performed at [facility], [place] [address] [place], [place] [address]      Radiological Exams on Admission: DG Chest Port 1 View  Result Date: 12/10/2019 CLINICAL DATA:  59 year old female with shortness of breath EXAM: PORTABLE CHEST 1 VIEW COMPARISON:  Chest radiograph dated 10/16/2019 and chest CT dated 10/13/2019. FINDINGS: Background of emphysema. Bilateral upper lobe nodular densities as seen on the prior CT of 10/13/2019, possibly scarring. Follow-up with CT as prior recommendation. No new consolidative changes. There is no  pleural effusion or pneumothorax. The cardiac silhouette is within normal limits. There is prominence of the central pulmonary arteries suggestive of pulmonary hypertension. No acute osseous pathology. IMPRESSION: 1. No acute cardiopulmonary process. 2. Emphysema and bilateral upper lobe scarring. Follow-up as per recommendation of the prior CT. 3. Probable pulmonary hypertension. Electronically Signed   By: Anner Crete M.D.   On: 12/10/2019 20:20    EKG: Independently reviewed. Sinus tachycardia RBBB nonspecific ST changes.  Assessment/Plan Principal Problem:   Acute respiratory failure (HCC) Active Problems:   COPD exacerbation (HCC)   Acute respiratory failure with hypoxemia ([place])    1. Acute respiratory failure with hypoxia secondary to COPD exacerbation for which I have placed patient on nebulizer Pulmicort and IV steroids. Since patient does have productive cough I have placed patient on doxycycline. 2. Tobacco abuse and alcohol abuse advised about quitting patient is on CIWA. 3. Anemia appears to be chronic follow metabolic panel. 4. Hyponatremia could be from alcohol abuse follow metabolic panel closely. 5. Depression on mirtazapine and Wellbutrin. 6. Hypocalcemia seen in the labs will need to check corrected calcium.  Since patient has acute respiratory failure with hypoxia with COPD exacerbation clinically patient still short of breath with active wheezing patient will need more than two midnight stay in inpatient status.   DVT prophylaxis: Lovenox. Code Status: Full code. Family Communication: Discussed with patient. Disposition Plan: Home. Consults called: None. Admission status: Inpatient.   Rise Patience MD [facility] Pager [phone].  If 7PM-7AM, please contact night-coverage [email or web address] Password [email or web address]  12/11/2019, 1:28 AM

## 2019-12-11 NOTE — ED Notes (Signed)
All belongings transported upstairs with pt by EMT

## 2019-12-11 NOTE — ED Notes (Signed)
Admitting MD presently in room. Pt alert and oriented. No complaints. Resp aware of breathing treatments.

## 2019-12-11 NOTE — Plan of Care (Signed)
Initiated care plan 

## 2019-12-11 NOTE — ED Notes (Signed)
[id number] pt has been resting without complaints, she has had breakfast, placed on hospital bed for comfort, She is aware of delay with going to a room, continue to monitor.

## 2019-12-12 MED ORDER — IPRATROPIUM BROMIDE 0.02 % IN SOLN
0.5000 mg | Freq: Four times a day (QID) | RESPIRATORY_TRACT | Status: DC
  Administered 2019-12-12 – 2019-12-17 (×19): 0.5 mg via RESPIRATORY_TRACT
  Filled 2019-12-12 (×19): qty 2.5

## 2019-12-12 MED ORDER — ENOXAPARIN SODIUM 30 MG/0.3ML ~~LOC~~ SOLN
30.0000 mg | SUBCUTANEOUS | Status: DC
  Administered 2019-12-12 – 2019-12-16 (×5): 30 mg via SUBCUTANEOUS
  Filled 2019-12-12 (×5): qty 0.3

## 2019-12-12 MED ORDER — LEVALBUTEROL HCL 0.63 MG/3ML IN NEBU
0.6300 mg | INHALATION_SOLUTION | Freq: Four times a day (QID) | RESPIRATORY_TRACT | Status: DC
  Administered 2019-12-12 – 2019-12-17 (×19): 0.63 mg via RESPIRATORY_TRACT
  Filled 2019-12-12 (×19): qty 3

## 2019-12-12 MED ORDER — PNEUMOCOCCAL VAC POLYVALENT 25 MCG/0.5ML IJ INJ: 0.5000 mL | INJECTION | INTRAMUSCULAR | Status: DC

## 2019-12-12 MED ORDER — ALPRAZOLAM 0.25 MG PO TABS
0.2500 mg | ORAL_TABLET | Freq: Two times a day (BID) | ORAL | Status: DC | PRN
  Administered 2019-12-12 – 2019-12-16 (×3): 0.25 mg via ORAL
  Filled 2019-12-12 (×3): qty 1

## 2019-12-12 MED ORDER — IPRATROPIUM BROMIDE 0.02 % IN SOLN
RESPIRATORY_TRACT | Status: AC
  Filled 2019-12-12: qty 2.5

## 2019-12-12 MED ORDER — ENOXAPARIN SODIUM 40 MG/0.4ML ~~LOC~~ SOLN: 40.0000 mg | SUBCUTANEOUS | Status: DC

## 2019-12-12 MED ORDER — LEVALBUTEROL HCL 0.63 MG/3ML IN NEBU: 0.6300 mg | INHALATION_SOLUTION | Freq: Four times a day (QID) | RESPIRATORY_TRACT | Status: DC | PRN

## 2019-12-12 MED ORDER — METHYLPREDNISOLONE SODIUM SUCC 40 MG IJ SOLR
40.0000 mg | Freq: Three times a day (TID) | INTRAMUSCULAR | Status: DC
  Administered 2019-12-12 – 2019-12-14 (×6): 40 mg via INTRAVENOUS
  Filled 2019-12-12 (×5): qty 1

## 2019-12-12 NOTE — Progress Notes (Signed)
PROGRESS NOTE  Christine Serrano  RXV:[id number] DOB: May 01, 1961 DOA: 12/10/2019 PCP: Ria Bush, MD  Outpatient Specialists: Pulmonology, Dr. Patsey Berthold Brief Narrative: Christine Serrano is a 59 y.o. female with a history of 4L O2-dependent COPD, tobacco use (>100 pack years, currently 1.5-2 ppd), alcohol abuse currently in remission, and depression who presented to the ED 6/25 with dyspnea, wheezing, cough typical of previous COPD exacerbations. CXR showed emphysema with some scarring but no infiltrate, SARS-CoV-2 negative. IV steroids, doxycycline, and nebulized breathing treatments have been started with some improvement. Due to continued dyspnea at rest and wheezing, the patient was admitted.   Assessment & Plan: Principal Problem:   Acute respiratory failure (HCC) Active Problems:   COPD exacerbation (HCC)   Acute respiratory failure with hypoxemia (HCC)  Acute COPD exacerbation with chronic hypoxemic respiratory failure: O2 requirement is unchanged from baseline, though work of breathing has been elevated and exam remains abnormal. - Continue solumedrol 40mg  IV, augment to TID until wheezing improved - Continue doxycycline x5 days - Continue scheduled and prn breathing treatments. Needs prn currently.  Tobacco use: Long-standing history.  - Cessation counseling provided - Nicotine patch 21 mg ordered - Low dose lung CT from April 2021 reviewed with patient at bedside, Lung-RADS 3s. Urged her to follow up again in late October (6 months).  Sinus tachycardia: Troponin negative. - Noted, will monitor telemetry. If becomes symptomatic, consider change to levalbuterol.  Hypocalcemia: Possibly due to chronic metabolic alkalosis, improving. Albumin wnl.  Depression: Mood stable - Continue wellbutrin, remeron.   DVT prophylaxis: Lovenox Code Status: Partial Family Communication: None at bedside Disposition Plan:  Status is: Inpatient  Remains inpatient appropriate  because:Inpatient level of care appropriate due to severity of illness   Dispo: The patient is from: Home              Anticipated d/c is to: Home              Anticipated d/c date is: 2 days              Patient currently is not medically stable to d/c.  Consultants:   None  Procedures:   None  Antimicrobials:  Doxycycline   Subjective: Feels short of breath at rest, still begins wheezing a couple hours after each duoneb. No chest pain or palpitations.   Objective: Vitals:   12/12/19 0858 12/12/19 0904 12/12/19 1333 12/12/19 1501  BP:   110/72   Pulse:   (!) 118   Resp:   17   Temp:   98.3 F (36.8 C)   TempSrc:   Oral   SpO2: 98% 98% 98% 98%  Weight:      Height:        Intake/Output Summary (Last 24 hours) at 12/12/2019 1545 Last data filed at 12/12/2019 0700 Gross per 24 hour  Intake 960 ml  Output --  Net 960 ml   Filed Weights   12/11/19 0648  Weight: 48.5 kg    Gen: 59 y.o. female in no distress  Pulm: Mildly labored, no accessory muscle use, tachypneic with supplemental oxygen.  CV: Regular rate and rhythm. No murmur, rub, or gallop. No JVD, no pedal edema. GI: Abdomen soft, non-tender, non-distended, with normoactive bowel sounds. No organomegaly or masses felt. Ext: Warm, no deformities Skin: No rashes, lesions or ulcers Neuro: Alert and oriented. No focal neurological deficits. Psych: Judgement and insight appear normal. Mood & affect appropriate.   Data Reviewed: I have personally reviewed following labs  and imaging studies  CBC: Recent Labs  Lab 12/10/19 2004 12/11/19 0500  WBC 7.3 3.6*  NEUTROABS 3.8  --   HGB 11.7* 12.2  HCT 35.8* 38.6  MCV 87.5 87.3  PLT 188 812   Basic Metabolic Panel: Recent Labs  Lab 12/10/19 2004 12/11/19 0500  NA 132* 137  K 3.6 4.3  CL 94* 96*  CO2 28 30  GLUCOSE 113* 176*  BUN <5* 8  CREATININE 0.52 0.66  CALCIUM 7.9* 8.6*   GFR: Estimated Creatinine Clearance: 57.3 mL/min (by C-G formula  based on SCr of 0.66 mg/dL). Liver Function Tests: Recent Labs  Lab 12/11/19 0500  AST 24  ALT 12  ALKPHOS 79  BILITOT 0.4  PROT 6.9  ALBUMIN 4.0   No results for input(s): LIPASE, AMYLASE in the last 168 hours. No results for input(s): AMMONIA in the last 168 hours. Coagulation Profile: No results for input(s): INR, PROTIME in the last 168 hours. Cardiac Enzymes: No results for input(s): CKTOTAL, CKMB, CKMBINDEX, TROPONINI in the last 168 hours. BNP (last 3 results) No results for input(s): PROBNP in the last 8760 hours. HbA1C: No results for input(s): HGBA1C in the last 72 hours. CBG: No results for input(s): GLUCAP in the last 168 hours. Lipid Profile: No results for input(s): CHOL, HDL, LDLCALC, TRIG, CHOLHDL, LDLDIRECT in the last 72 hours. Thyroid Function Tests: No results for input(s): TSH, T4TOTAL, FREET4, T3FREE, THYROIDAB in the last 72 hours. Anemia Panel: No results for input(s): VITAMINB12, FOLATE, FERRITIN, TIBC, IRON, RETICCTPCT in the last 72 hours. Urine analysis:    Component Value Date/Time   COLORURINE STRAW (A) 09/11/2019 0224   APPEARANCEUR CLEAR 09/11/2019 0224   LABSPEC 1.002 (L) 09/11/2019 0224   PHURINE 6.0 09/11/2019 0224   GLUCOSEU NEGATIVE 09/11/2019 0224   HGBUR SMALL (A) 09/11/2019 0224   BILIRUBINUR NEGATIVE 09/11/2019 0224   KETONESUR NEGATIVE 09/11/2019 0224   PROTEINUR NEGATIVE 09/11/2019 0224   NITRITE NEGATIVE 09/11/2019 0224   LEUKOCYTESUR NEGATIVE 09/11/2019 0224   Recent Results (from the past 240 hour(s))  SARS Coronavirus 2 by RT PCR (hospital order, performed in [place] hospital lab) Nasopharyngeal Nasopharyngeal Swab     Status: None   Collection Time: 12/10/19  8:05 PM   Specimen: Nasopharyngeal Swab  Result Value Ref Range Status   SARS Coronavirus 2 NEGATIVE NEGATIVE Final    Comment: (NOTE) SARS-CoV-2 target nucleic acids are NOT DETECTED.  The SARS-CoV-2 RNA is generally detectable in upper and  lower respiratory specimens during the acute phase of infection. The lowest concentration of SARS-CoV-2 viral copies this assay can detect is 250 copies / mL. A negative result does not preclude SARS-CoV-2 infection and should not be used as the sole basis for treatment or other patient management decisions.  A negative result may occur with improper specimen collection / handling, submission of specimen other than nasopharyngeal swab, presence of viral mutation(s) within the areas targeted by this assay, and inadequate number of viral copies (<250 copies / mL). A negative result must be combined with clinical observations, patient history, and epidemiological information.  Fact Sheet for Patients:   StrictlyIdeas.no  Fact Sheet for Healthcare Providers: BankingDealers.co.za  This test is not yet approved or  cleared by the [place] FDA and has been authorized for detection and/or diagnosis of SARS-CoV-2 by FDA under an Emergency Use Authorization (EUA).  This EUA will remain in effect (meaning this test can be used) for the duration of the COVID-19 declaration under Section  564(b)(1) of the Act, 21 U.S.C. section 360bbb-3(b)(1), unless the authorization is terminated or revoked sooner.  Performed at [facility], [place] [address], [place], [place] [address]       Radiology Studies: [facility] Chest Port 1 View  Result Date: 12/10/2019 CLINICAL DATA:  59 year old female with shortness of breath EXAM: PORTABLE CHEST 1 VIEW COMPARISON:  Chest radiograph dated 10/16/2019 and chest CT dated 10/13/2019. FINDINGS: Background of emphysema. Bilateral upper lobe nodular densities as seen on the prior CT of 10/13/2019, possibly scarring. Follow-up with CT as prior recommendation. No new consolidative changes. There is no pleural effusion or pneumothorax. The cardiac silhouette is within normal limits. There is prominence of the  central pulmonary arteries suggestive of pulmonary hypertension. No acute osseous pathology. IMPRESSION: 1. No acute cardiopulmonary process. 2. Emphysema and bilateral upper lobe scarring. Follow-up as per recommendation of the prior CT. 3. Probable pulmonary hypertension. Electronically Signed   By: Anner Crete M.D.   On: 12/10/2019 20:20    Scheduled Meds:  vitamin C with rose hips  500 mg Oral Daily   aspirin EC  81 mg Oral Daily   budesonide (PULMICORT) nebulizer solution  0.25 mg Nebulization BID   buPROPion  150 mg Oral Daily   cholecalciferol  1,000 Units Oral Daily   doxycycline  100 mg Oral Q12H   gabapentin  100 mg Oral BID   ipratropium  0.5 mg Nebulization Q6H   levalbuterol  0.63 mg Nebulization Q6H   linaclotide  290 mcg Oral QAC breakfast   methylPREDNISolone (SOLU-MEDROL) injection  40 mg Intravenous Q12H   mirtazapine  15 mg Oral QHS   multivitamin with minerals  1 tablet Oral Daily   nicotine  21 mg Transdermal Daily   pantoprazole  40 mg Oral Daily   sertraline  150 mg Oral Daily   Continuous Infusions:   LOS: 2 days   Time spent: 25 minutes.  Patrecia Pour, MD [facility] [email or web address] 12/12/2019, 3:45 PM

## 2019-12-12 NOTE — Plan of Care (Signed)

## 2019-12-12 NOTE — Plan of Care (Signed)
Problem: Education: Goal: Knowledge of disease or condition will improve 12/12/2019 2236 by Terrence Dupont, RN Outcome: Progressing 12/12/2019 2235 by Terrence Dupont, RN Outcome: Progressing Goal: Knowledge of the prescribed therapeutic regimen will improve 12/12/2019 2236 by Terrence Dupont, RN Outcome: Progressing 12/12/2019 2235 by Terrence Dupont, RN Outcome: Progressing Goal: Individualized Educational Video(s) 12/12/2019 2236 by Terrence Dupont, RN Outcome: Progressing 12/12/2019 2235 by Terrence Dupont, RN Outcome: Progressing   Problem: Activity: Goal: Ability to tolerate increased activity will improve 12/12/2019 2236 by Terrence Dupont, RN Outcome: Progressing 12/12/2019 2235 by Terrence Dupont, RN Outcome: Progressing Goal: Will verbalize the importance of balancing activity with adequate rest periods 12/12/2019 2236 by Terrence Dupont, RN Outcome: Progressing 12/12/2019 2235 by Terrence Dupont, RN Outcome: Progressing   Problem: Respiratory: Goal: Ability to maintain a clear airway will improve 12/12/2019 2236 by Terrence Dupont, RN Outcome: Progressing 12/12/2019 2235 by Terrence Dupont, RN Outcome: Progressing Goal: Levels of oxygenation will improve 12/12/2019 2236 by Terrence Dupont, RN Outcome: Progressing 12/12/2019 2235 by Terrence Dupont, RN Outcome: Progressing Goal: Ability to maintain adequate ventilation will improve 12/12/2019 2236 by Terrence Dupont, RN Outcome: Progressing 12/12/2019 2235 by Terrence Dupont, RN Outcome: Progressing   Problem: Education: Goal: Knowledge of General Education information will improve Description: Including pain rating scale, medication(s)/side effects and non-pharmacologic comfort measures 12/12/2019 2236 by Terrence Dupont, RN Outcome: Progressing 12/12/2019 2235 by Terrence Dupont, RN Outcome: Progressing   Problem: Health Behavior/Discharge Planning: Goal: Ability to manage health-related needs will improve 12/12/2019 2236 by Terrence Dupont,  RN Outcome: Progressing 12/12/2019 2235 by Terrence Dupont, RN Outcome: Progressing   Problem: Clinical Measurements: Goal: Ability to maintain clinical measurements within normal limits will improve 12/12/2019 2236 by Terrence Dupont, RN Outcome: Progressing 12/12/2019 2235 by Terrence Dupont, RN Outcome: Progressing Goal: Will remain free from infection 12/12/2019 2236 by Terrence Dupont, RN Outcome: Progressing 12/12/2019 2235 by Terrence Dupont, RN Outcome: Progressing Goal: Diagnostic test results will improve 12/12/2019 2236 by Terrence Dupont, RN Outcome: Progressing 12/12/2019 2235 by Terrence Dupont, RN Outcome: Progressing Goal: Respiratory complications will improve 12/12/2019 2236 by Terrence Dupont, RN Outcome: Progressing 12/12/2019 2235 by Terrence Dupont, RN Outcome: Progressing Goal: Cardiovascular complication will be avoided 12/12/2019 2236 by Terrence Dupont, RN Outcome: Progressing 12/12/2019 2235 by Terrence Dupont, RN Outcome: Progressing   Problem: Activity: Goal: Risk for activity intolerance will decrease 12/12/2019 2236 by Terrence Dupont, RN Outcome: Progressing 12/12/2019 2235 by Terrence Dupont, RN Outcome: Progressing   Problem: Nutrition: Goal: Adequate nutrition will be maintained 12/12/2019 2236 by Terrence Dupont, RN Outcome: Progressing 12/12/2019 2235 by Terrence Dupont, RN Outcome: Progressing   Problem: Coping: Goal: Level of anxiety will decrease 12/12/2019 2236 by Terrence Dupont, RN Outcome: Progressing 12/12/2019 2235 by Terrence Dupont, RN Outcome: Progressing   Problem: Elimination: Goal: Will not experience complications related to bowel motility 12/12/2019 2236 by Terrence Dupont, RN Outcome: Progressing 12/12/2019 2235 by Terrence Dupont, RN Outcome: Progressing Goal: Will not experience complications related to urinary retention 12/12/2019 2236 by Terrence Dupont, RN Outcome: Progressing 12/12/2019 2235 by Terrence Dupont, RN Outcome: Progressing   Problem: Pain  Managment: Goal: General experience of comfort will improve 12/12/2019 2236 by Terrence Dupont, RN Outcome: Progressing 12/12/2019 2235 by Terrence Dupont, RN Outcome: Progressing   Problem: Safety: Goal: Ability to remain free from injury will improve 12/12/2019 2236 by Terrence Dupont, RN Outcome: Progressing 12/12/2019 2235 by Terrence Dupont, RN Outcome: Progressing   Problem: Skin Integrity: Goal: Risk for impaired skin integrity will decrease 12/12/2019 2236 by Terrence Dupont,  RN Outcome: Progressing 12/12/2019 2235 by Terrence Dupont, RN Outcome: Progressing

## 2019-12-13 NOTE — Plan of Care (Signed)
  Problem: Respiratory: Goal: Ability to maintain a clear airway will improve Outcome: Progressing   Problem: Education: Goal: Knowledge of disease or condition will improve Outcome: Completed/Met   Problem: Activity: Goal: Ability to tolerate increased activity will improve Outcome: Completed/Met Goal: Will verbalize the importance of balancing activity with adequate rest periods Outcome: Completed/Met

## 2019-12-13 NOTE — Care Management Important Message (Signed)
Important Message  Patient Details IM Letter given to Dessa Phi RN  Case Manager to present to the Patient Name: Christine Serrano MRN: [id number] Date of Birth: 07-19-1960   Medicare Important Message Given:  Yes     Kerin Salen 12/13/2019, 12:17 PM

## 2019-12-13 NOTE — Telephone Encounter (Signed)
Faxed order

## 2019-12-13 NOTE — TOC Initial Note (Signed)
Transition of Care [facility]) - Initial/Assessment Note    Patient Details  Name: Christine Serrano MRN: [id number] Date of Birth: 17-Feb-1961  Transition of [facility] CM/SW Contact:    Dessa Phi, RN Phone Number: 12/13/2019, 12:40 PM  Clinical Narrative:Spoke to patient about d/c plans-home. CM referral for etoh/SA sounseling-patient states she has not drank in months-she states she is able to handle her own dringkin issue & smoking. No further CM needs.                     Barriers to Discharge: Continued Medical Work up   Patient Goals and CMS Choice Patient states their goals for this hospitalization and ongoing recovery are:: go home CMS Medicare.gov Compare Post Acute Care list provided to:: Patient Choice offered to / list presented to : Patient  Expected Discharge Plan and Services     Discharge Planning Services: CM Consult   Living arrangements for the past 2 months: Single Family Home                                      Prior Living Arrangements/Services Living arrangements for the past 2 months: Single Family Home Lives with:: Self Patient language and need for interpreter reviewed:: Yes Do you feel safe going back to the place where you live?: Yes      Need for Family Participation in Patient Care: No (Comment) Care giver support system in place?: Yes (comment) Current home services: DME (home 02-portable) Criminal Activity/Legal Involvement Pertinent to Current Situation/Hospitalization: No - Comment as needed  Activities of Daily Living Home Assistive Devices/Equipment: Oxygen ADL Screening (condition at time of admission) Patient's cognitive ability adequate to safely complete daily activities?: Yes Is the patient deaf or have difficulty hearing?: No Does the patient have difficulty seeing, even when wearing glasses/contacts?: No Does the patient have difficulty concentrating, remembering, or making decisions?: No Patient able to express need  for assistance with ADLs?: Yes Does the patient have difficulty dressing or bathing?: No Independently performs ADLs?: Yes (appropriate for developmental age) Does the patient have difficulty walking or climbing stairs?: No Weakness of Legs: None Weakness of Arms/Hands: None  Permission Sought/Granted Permission sought to share information with : Case Manager Permission granted to share information with : Yes, Verbal Permission Granted  Share Information with NAME: Case Manager           Emotional Assessment Appearance:: Appears stated age Attitude/Demeanor/Rapport: Gracious Affect (typically observed): Accepting Orientation: : Oriented to Self, Oriented to Place, Oriented to  Time, Oriented to Situation Alcohol / Substance Use: Not Applicable Psych Involvement: No (comment)  Admission diagnosis:  Acute respiratory failure (HCC) [J96.00] COPD exacerbation (HCC) [J44.1] Acute respiratory failure with hypoxemia ([place]) [J96.01] Patient Active Problem List   Diagnosis Date Noted  . Acute respiratory failure with hypoxemia ([place]) 12/11/2019  . Acute respiratory failure ([place]) 12/10/2019  . Rhomboid muscle strain, initial encounter 11/10/2019  . Palliative care status 10/23/2019  . Impaired mobility 10/20/2019  . Chronic respiratory failure with hypoxia, on home O2 therapy ([place]) 10/20/2019  . Memory difficulties 10/16/2019  . Bug bite 10/16/2019  . Folate deficiency 09/17/2019  . Dizziness 09/11/2019  . Anemia 09/11/2019  . Hypocalcemia 09/11/2019  . Carotid stenosis, asymptomatic, left 04/06/2019  . Systolic murmur 60/03/9322  . Left carotid bruit 02/27/2019  . Hypervitaminosis D 02/27/2019  . Medicare annual wellness visit, subsequent 02/25/2019  .  Advanced directives, counseling/discussion 02/25/2019  . Epigastric abdominal pain 09/21/2018  . Epistaxis 09/21/2018  . COPD exacerbation ([place]) 09/02/2018  . Osteoporosis 05/23/2018  . Numbness of right hand 10/10/2017  . Nasal  sinus congestion 10/10/2017  . Benign neoplasm of ascending colon   . Benign neoplasm of transverse colon   . Benign neoplasm of sigmoid colon   . Positive hepatitis C antibody test 02/23/2017  . Health maintenance examination 02/21/2017  . HLD (hyperlipidemia) 02/21/2017  . Anxiety 12/10/2016  . Tobacco abuse 11/28/2016  . Aortic atherosclerosis ([place]) 11/26/2016  . Tachycardia 11/01/2016  . Pulmonary nodule 10/25/2016  . Moderate recurrent major depression ([place]) 07/03/2016  . Chronic constipation 07/03/2016  . Alcohol abuse, in remission 07/03/2016  . COPD, very severe ([place]) 07/03/2016  . Occasional tremors 07/03/2016  . Chronic insomnia 07/03/2016   PCP:  Ria Bush, MD Pharmacy:   [id number] - [place], [place] [address] Phone: [phone] Fax: [facility] [place], [place] [address] Phone: [phone] Fax: [phone]     Social Determinants of Health (SDOH) Interventions    Readmission Risk Interventions No flowsheet data found.

## 2019-12-13 NOTE — Progress Notes (Signed)
PROGRESS NOTE  Christine Serrano  YYT:[id number] DOB: 11-10-1960 DOA: 12/10/2019 PCP: Christine Bush, MD  Outpatient Specialists: Pulmonology, Dr. Patsey Serrano Brief Narrative: Christine Serrano is a 59 y.o. female with a history of 4L O2-dependent COPD, tobacco use (>100 pack years, currently 1.5-2 ppd), alcohol abuse currently in remission, and depression who presented to the ED 6/25 with dyspnea, wheezing, cough typical of previous COPD exacerbations. CXR showed emphysema with some scarring but no infiltrate, SARS-CoV-2 negative. IV steroids, doxycycline, and nebulized breathing treatments have been started with some improvement. Due to continued dyspnea at rest and wheezing, the patient was admitted.   Assessment & Plan: Principal Problem:   Acute respiratory failure (HCC) Active Problems:   COPD exacerbation (HCC)   Acute respiratory failure with hypoxemia (HCC)  Acute COPD exacerbation with chronic hypoxemic respiratory failure: O2 requirement is unchanged from baseline, though work of breathing has been elevated and exam remains abnormal. - Continue solumedrol 40mg  IV, augmented to TID. Once wheezing improves, can decrease dosing. - Continue doxycycline x5 days - Continue scheduled and prn breathing treatments.    Tobacco use: Long-standing history.  - Cessation counseling provided - Nicotine patch 21 mg ordered - Low dose lung CT from April 2021 reviewed with patient at bedside, Lung-RADS 3s. Urged her to follow up again in late October (6 months).  Sinus tachycardia: Troponin negative. - Improved overall. Last TSH was wnl.  Hypocalcemia: Possibly due to chronic metabolic alkalosis, improved. Albumin wnl.  Depression: Mood stable - Continue wellbutrin, remeron.   DVT prophylaxis: Lovenox Code Status: Partial Family Communication: None at bedside Disposition Plan:  Status is: Inpatient  Remains inpatient appropriate because:Inpatient level of care appropriate due to  severity of illness   Dispo: The patient is from: Home              Anticipated d/c is to: Home              Anticipated d/c date is: 2-3 days based on duration of hospitalization with prior exacerbations, still wheezing.              Patient currently is not medically stable to d/c.  Consultants:   None  Procedures:   None  Antimicrobials:  Doxycycline   Subjective: Had a bad night with wheezing and shortness of breath. No chest pain. Got breathing treatment an hour prior to exam this AM which helps significantly.   Objective: Vitals:   12/13/19 0228 12/13/19 0602 12/13/19 0842 12/13/19 1435  BP:  129/77  126/85  Pulse:  100  (!) 114  Resp:  15  16  Temp:  97.6 F (36.4 C)  97.6 F (36.4 C)  TempSrc:  Oral  Oral  SpO2: 99% 100% 96% 99%  Weight:      Height:        Intake/Output Summary (Last 24 hours) at 12/13/2019 1614 Last data filed at 12/13/2019 1000 Gross per 24 hour  Intake 720 ml  Output --  Net 720 ml   Filed Weights   12/11/19 0648  Weight: 48.5 kg   Gen: 59 y.o. female in no distress Pulm: Nonlabored but tachypneic on 4L O2, expiratory wheezes bilaterally with expiratory prolongation. CV: Regular rate and rhythm. No murmur, rub, or gallop. No JVD, no dependent edema. GI: Abdomen soft, non-tender, non-distended, with normoactive bowel sounds.  Ext: Warm, no deformities Skin: No rashes, lesions or ulcers on visualized skin. Neuro: Alert and oriented. No focal neurological deficits. Psych: Judgement and insight appear fair.  Mood euthymic & affect congruent. Behavior is appropriate.    Data Reviewed: I have personally reviewed following labs and imaging studies  CBC: Recent Labs  Lab 12/10/19 2004 12/11/19 0500  WBC 7.3 3.6*  NEUTROABS 3.8  --   HGB 11.7* 12.2  HCT 35.8* 38.6  MCV 87.5 87.3  PLT 188 782   Basic Metabolic Panel: Recent Labs  Lab 12/10/19 2004 12/11/19 0500  NA 132* 137  K 3.6 4.3  CL 94* 96*  CO2 28 30  GLUCOSE 113*  176*  BUN <5* 8  CREATININE 0.52 0.66  CALCIUM 7.9* 8.6*   GFR: Estimated Creatinine Clearance: 57.3 mL/min (by C-G formula based on SCr of 0.66 mg/dL). Liver Function Tests: Recent Labs  Lab 12/11/19 0500  AST 24  ALT 12  ALKPHOS 79  BILITOT 0.4  PROT 6.9  ALBUMIN 4.0   No results for input(s): LIPASE, AMYLASE in the last 168 hours. No results for input(s): AMMONIA in the last 168 hours. Coagulation Profile: No results for input(s): INR, PROTIME in the last 168 hours. Cardiac Enzymes: No results for input(s): CKTOTAL, CKMB, CKMBINDEX, TROPONINI in the last 168 hours. BNP (last 3 results) No results for input(s): PROBNP in the last 8760 hours. HbA1C: No results for input(s): HGBA1C in the last 72 hours. CBG: No results for input(s): GLUCAP in the last 168 hours. Lipid Profile: No results for input(s): CHOL, HDL, LDLCALC, TRIG, CHOLHDL, LDLDIRECT in the last 72 hours. Thyroid Function Tests: No results for input(s): TSH, T4TOTAL, FREET4, T3FREE, THYROIDAB in the last 72 hours. Anemia Panel: No results for input(s): VITAMINB12, FOLATE, FERRITIN, TIBC, IRON, RETICCTPCT in the last 72 hours. Urine analysis:    Component Value Date/Time   COLORURINE STRAW (A) 09/11/2019 0224   APPEARANCEUR CLEAR 09/11/2019 0224   LABSPEC 1.002 (L) 09/11/2019 0224   PHURINE 6.0 09/11/2019 0224   GLUCOSEU NEGATIVE 09/11/2019 0224   HGBUR SMALL (A) 09/11/2019 0224   BILIRUBINUR NEGATIVE 09/11/2019 0224   KETONESUR NEGATIVE 09/11/2019 0224   PROTEINUR NEGATIVE 09/11/2019 0224   NITRITE NEGATIVE 09/11/2019 0224   LEUKOCYTESUR NEGATIVE 09/11/2019 0224   Recent Results (from the past 240 hour(s))  SARS Coronavirus 2 by RT PCR (hospital order, performed in [facility] hospital lab) Nasopharyngeal Nasopharyngeal Swab     Status: None   Collection Time: 12/10/19  8:05 PM   Specimen: Nasopharyngeal Swab  Result Value Ref Range Status   SARS Coronavirus 2 NEGATIVE NEGATIVE Final    Comment:  (NOTE) SARS-CoV-2 target nucleic acids are NOT DETECTED.  The SARS-CoV-2 RNA is generally detectable in upper and lower respiratory specimens during the acute phase of infection. The lowest concentration of SARS-CoV-2 viral copies this assay can detect is 250 copies / mL. A negative result does not preclude SARS-CoV-2 infection and should not be used as the sole basis for treatment or other patient management decisions.  A negative result may occur with improper specimen collection / handling, submission of specimen other than nasopharyngeal swab, presence of viral mutation(s) within the areas targeted by this assay, and inadequate number of viral copies (<250 copies / mL). A negative result must be combined with clinical observations, patient history, and epidemiological information.  Fact Sheet for Patients:   StrictlyIdeas.no  Fact Sheet for Healthcare Providers: BankingDealers.co.za  This test is not yet approved or  cleared by the [place] FDA and has been authorized for detection and/or diagnosis of SARS-CoV-2 by FDA under an Emergency Use Authorization (EUA).  This EUA  will remain in effect (meaning this test can be used) for the duration of the COVID-19 declaration under Section 564(b)(1) of the Act, 21 U.S.C. section 360bbb-3(b)(1), unless the authorization is terminated or revoked sooner.  Performed at [facility], [place] [address], [place], [place] [address]       Radiology Studies: No results found.  Scheduled Meds: . vitamin C with rose hips  500 mg Oral Daily  . aspirin EC  81 mg Oral Daily  . budesonide (PULMICORT) nebulizer solution  0.25 mg Nebulization BID  . buPROPion  150 mg Oral Daily  . cholecalciferol  1,000 Units Oral Daily  . doxycycline  100 mg Oral Q12H  . enoxaparin (LOVENOX) injection  30 mg Subcutaneous Q24H  . gabapentin  100 mg Oral BID  . ipratropium  0.5 mg Nebulization  Q6H  . levalbuterol  0.63 mg Nebulization Q6H  . linaclotide  290 mcg Oral QAC breakfast  . methylPREDNISolone (SOLU-MEDROL) injection  40 mg Intravenous Q8H  . mirtazapine  15 mg Oral QHS  . multivitamin with minerals  1 tablet Oral Daily  . nicotine  21 mg Transdermal Daily  . pantoprazole  40 mg Oral Daily  . sertraline  150 mg Oral Daily   Continuous Infusions:   LOS: 3 days   Time spent: 25 minutes.  Patrecia Pour, MD [facility] [email or web address] 12/13/2019, 4:14 PM

## 2019-12-14 MED ORDER — METHYLPREDNISOLONE SODIUM SUCC 40 MG IJ SOLR
40.0000 mg | Freq: Two times a day (BID) | INTRAMUSCULAR | Status: DC
  Administered 2019-12-14 – 2019-12-17 (×6): 40 mg via INTRAVENOUS
  Filled 2019-12-14 (×6): qty 1

## 2019-12-14 NOTE — Plan of Care (Signed)
  Problem: Education: Goal: Knowledge of the prescribed therapeutic regimen will improve Outcome: Progressing   Problem: Respiratory: Goal: Ability to maintain a clear airway will improve Outcome: Progressing

## 2019-12-14 NOTE — Progress Notes (Signed)
PROGRESS NOTE  Christine Serrano  WPY:[id number] DOB: [birth year] DOA: 12/10/2019 PCP: Ria Bush, MD  Outpatient Specialists: Pulmonology, Dr. Patsey Berthold Brief Narrative: Christine Serrano is a 59 y.o. female with a history of 4L O2-dependent COPD, tobacco use (>100 pack years, currently 1.5-2 ppd), alcohol abuse currently in remission, and depression who presented to the ED 6/25 with dyspnea, wheezing, cough typical of previous COPD exacerbations. CXR showed emphysema with some scarring but no infiltrate, SARS-CoV-2 negative. IV steroids, doxycycline, and nebulized breathing treatments have been started with some improvement. Due to continued dyspnea at rest and wheezing, the patient was admitted.   Assessment & Plan: Principal Problem:   Acute respiratory failure (HCC) Active Problems:   COPD exacerbation (HCC)   Acute respiratory failure with hypoxemia (HCC)  Acute COPD exacerbation with chronic hypoxemic respiratory failure: O2 requirement is unchanged from baseline, though work of breathing has been elevated and exam remains abnormal. - Decrease solumedrol 40mg  IV to BID.  - Continue doxycycline x5 days - Continue scheduled and prn breathing treatments.   - Mobilize  Tobacco use: Long-standing history.  - Cessation counseling provided - Nicotine patch 21 mg ordered - Low dose lung CT follow up in Oct 2021 (April 2021 Lung-RADS 3s)  Sinus tachycardia: Troponin negative. - Improved overall. Last TSH was wnl.  Hypocalcemia: Possibly due to chronic metabolic alkalosis, improved. Albumin wnl.  Depression: Mood stable - Continue wellbutrin, remeron.   DVT prophylaxis: Lovenox Code Status: Partial, DNI Family Communication: None at bedside Disposition Plan:  Status is: Inpatient  Remains inpatient appropriate because:Inpatient level of care appropriate due to severity of illness   Dispo: The patient is from: Home              Anticipated d/c is to: Home               Anticipated d/c date is: 2-3 days based on duration of hospitalization with prior exacerbations, still wheezing.              Patient currently is not medically stable to d/c.  Consultants:   None  Procedures:   None  Antimicrobials:  Doxycycline   Subjective: Shortness of breath is slightly improved, wheezing is moderate, waxing/waning, improved with breathing treatments. Overall a bit better but not at baseline.   Objective: Vitals:   12/14/19 0300 12/14/19 0507 12/14/19 0820 12/14/19 1335  BP: 120/80 131/85  137/81  Pulse: 100 (!) 108    Resp: 17 20  20   Temp: 98 F (36.7 C) 98.4 F (36.9 C)  97.8 F (36.6 C)  TempSrc:    Oral  SpO2: 99% 100% 96% 98%  Weight:      Height:        Intake/Output Summary (Last 24 hours) at 12/14/2019 1422 Last data filed at 12/14/2019 0200 Gross per 24 hour  Intake 720 ml  Output --  Net 720 ml   Filed Weights   12/11/19 0648  Weight: 48.5 kg   Gen: 59 y.o. female in no distress Pulm: Nonlabored breathing 4LPM, tachypneic without accessory muscle use, End-expiratory wheezes bilaterally.  CV: Regular rate and rhythm. No murmur, rub, or gallop. No JVD, no dependent edema. GI: Abdomen soft, non-tender, non-distended, with normoactive bowel sounds.  Ext: Warm, no deformities Skin: No rashes, lesions or ulcers on visualized skin. Neuro: Alert and oriented. No focal neurological deficits. Psych: Judgement and insight appear fair. Mood euthymic & affect congruent. Behavior is appropriate.    Data Reviewed: I have personally  reviewed following labs and imaging studies  CBC: Recent Labs  Lab 12/10/19 2004 12/11/19 0500  WBC 7.3 3.6*  NEUTROABS 3.8  --   HGB 11.7* 12.2  HCT 35.8* 38.6  MCV 87.5 87.3  PLT 188 408   Basic Metabolic Panel: Recent Labs  Lab 12/10/19 2004 12/11/19 0500  NA 132* 137  K 3.6 4.3  CL 94* 96*  CO2 28 30  GLUCOSE 113* 176*  BUN <5* 8  CREATININE 0.52 0.66  CALCIUM 7.9* 8.6*   GFR: Estimated  Creatinine Clearance: 57.3 mL/min (by C-G formula based on SCr of 0.66 mg/dL). Liver Function Tests: Recent Labs  Lab 12/11/19 0500  AST 24  ALT 12  ALKPHOS 79  BILITOT 0.4  PROT 6.9  ALBUMIN 4.0   No results for input(s): LIPASE, AMYLASE in the last 168 hours. No results for input(s): AMMONIA in the last 168 hours. Coagulation Profile: No results for input(s): INR, PROTIME in the last 168 hours. Cardiac Enzymes: No results for input(s): CKTOTAL, CKMB, CKMBINDEX, TROPONINI in the last 168 hours. BNP (last 3 results) No results for input(s): PROBNP in the last 8760 hours. HbA1C: No results for input(s): HGBA1C in the last 72 hours. CBG: No results for input(s): GLUCAP in the last 168 hours. Lipid Profile: No results for input(s): CHOL, HDL, LDLCALC, TRIG, CHOLHDL, LDLDIRECT in the last 72 hours. Thyroid Function Tests: No results for input(s): TSH, T4TOTAL, FREET4, T3FREE, THYROIDAB in the last 72 hours. Anemia Panel: No results for input(s): VITAMINB12, FOLATE, FERRITIN, TIBC, IRON, RETICCTPCT in the last 72 hours. Urine analysis:    Component Value Date/Time   COLORURINE STRAW (A) 09/11/2019 0224   APPEARANCEUR CLEAR 09/11/2019 0224   LABSPEC 1.002 (L) 09/11/2019 0224   PHURINE 6.0 09/11/2019 0224   GLUCOSEU NEGATIVE 09/11/2019 0224   HGBUR SMALL (A) 09/11/2019 0224   BILIRUBINUR NEGATIVE 09/11/2019 0224   KETONESUR NEGATIVE 09/11/2019 0224   PROTEINUR NEGATIVE 09/11/2019 0224   NITRITE NEGATIVE 09/11/2019 0224   LEUKOCYTESUR NEGATIVE 09/11/2019 0224   Recent Results (from the past 240 hour(s))  SARS Coronavirus 2 by RT PCR (hospital order, performed in [facility] hospital lab) Nasopharyngeal Nasopharyngeal Swab     Status: None   Collection Time: 12/10/19  8:05 PM   Specimen: Nasopharyngeal Swab  Result Value Ref Range Status   SARS Coronavirus 2 NEGATIVE NEGATIVE Final    Comment: (NOTE) SARS-CoV-2 target nucleic acids are NOT DETECTED.  The SARS-CoV-2 RNA is  generally detectable in upper and lower respiratory specimens during the acute phase of infection. The lowest concentration of SARS-CoV-2 viral copies this assay can detect is 250 copies / mL. A negative result does not preclude SARS-CoV-2 infection and should not be used as the sole basis for treatment or other patient management decisions.  A negative result may occur with improper specimen collection / handling, submission of specimen other than nasopharyngeal swab, presence of viral mutation(s) within the areas targeted by this assay, and inadequate number of viral copies (<250 copies / mL). A negative result must be combined with clinical observations, patient history, and epidemiological information.  Fact Sheet for Patients:   StrictlyIdeas.no  Fact Sheet for Healthcare Providers: BankingDealers.co.za  This test is not yet approved or  cleared by the [place] FDA and has been authorized for detection and/or diagnosis of SARS-CoV-2 by FDA under an Emergency Use Authorization (EUA).  This EUA will remain in effect (meaning this test can be used) for the duration of the COVID-19  declaration under Section 564(b)(1) of the Act, 21 U.S.C. section 360bbb-3(b)(1), unless the authorization is terminated or revoked sooner.  Performed at [facility], [place] [address] [place], [place] [address]       Radiology Studies: No results found.  Scheduled Meds: . vitamin C with rose hips  500 mg Oral Daily  . aspirin EC  81 mg Oral Daily  . budesonide (PULMICORT) nebulizer solution  0.25 mg Nebulization BID  . buPROPion  150 mg Oral Daily  . cholecalciferol  1,000 Units Oral Daily  . doxycycline  100 mg Oral Q12H  . enoxaparin (LOVENOX) injection  30 mg Subcutaneous Q24H  . gabapentin  100 mg Oral BID  . ipratropium  0.5 mg Nebulization Q6H  . levalbuterol  0.63 mg Nebulization Q6H  . linaclotide  290 mcg Oral QAC  breakfast  . methylPREDNISolone (SOLU-MEDROL) injection  40 mg Intravenous Q8H  . mirtazapine  15 mg Oral QHS  . multivitamin with minerals  1 tablet Oral Daily  . nicotine  21 mg Transdermal Daily  . pantoprazole  40 mg Oral Daily  . sertraline  150 mg Oral Daily   Continuous Infusions:   LOS: 4 days   Time spent: 25 minutes.  Patrecia Pour, MD [facility] [email or web address] 12/14/2019, 2:22 PM

## 2019-12-15 DIAGNOSIS — J9601 Acute respiratory failure with hypoxia: Secondary | ICD-10-CM

## 2019-12-15 DIAGNOSIS — J441 Chronic obstructive pulmonary disease with (acute) exacerbation: Secondary | ICD-10-CM

## 2019-12-15 MED ORDER — BUDESONIDE 0.5 MG/2ML IN SUSP
0.5000 mg | Freq: Two times a day (BID) | RESPIRATORY_TRACT | Status: DC
  Administered 2019-12-15 – 2019-12-17 (×4): 0.5 mg via RESPIRATORY_TRACT
  Filled 2019-12-15 (×4): qty 2

## 2019-12-15 MED ORDER — GUAIFENESIN ER 600 MG PO TB12
1200.0000 mg | ORAL_TABLET | Freq: Two times a day (BID) | ORAL | Status: DC
  Administered 2019-12-15 – 2019-12-17 (×4): 1200 mg via ORAL
  Filled 2019-12-15 (×4): qty 2

## 2019-12-15 MED ORDER — ARFORMOTEROL TARTRATE 15 MCG/2ML IN NEBU
15.0000 ug | INHALATION_SOLUTION | Freq: Two times a day (BID) | RESPIRATORY_TRACT | Status: DC
  Administered 2019-12-15 – 2019-12-17 (×4): 15 ug via RESPIRATORY_TRACT
  Filled 2019-12-15 (×4): qty 2

## 2019-12-15 NOTE — Progress Notes (Signed)
PROGRESS NOTE  Christine Serrano  RCB:[id number] DOB: [birth year] DOA: 12/10/2019 PCP: Ria Bush, MD  Outpatient Specialists: Pulmonology, Dr. Patsey Berthold Brief Narrative: Christine Serrano is a 59 y.o. female with a history of severe 4L O2-dependent COPD, tobacco use (>100 pack years, currently 1.5-2 ppd), alcohol abuse currently in remission, and depression who presented to the ED 6/25 with dyspnea, wheezing, cough typical of previous COPD exacerbations. CXR showed emphysema with some scarring but no infiltrate, SARS-CoV-2 negative. IV steroids, doxycycline, and nebulized breathing treatments have been started with some improvement. Due to continued dyspnea at rest and wheezing, the patient was admitted. After 5 days of IV steroids and doxycycline with scheduled and prn bronchodilators, she remains with wheezing and dyspnea worse than baseline. Pulmonology is consulted for additional recommendations.  Assessment & Plan: Principal Problem:   Acute respiratory failure (HCC) Active Problems:   COPD exacerbation (HCC)   Acute respiratory failure with hypoxemia (HCC)  Acute COPD exacerbation with chronic hypoxemic respiratory failure due to cevere baseline COPD:  - Failure to significantly improve could be expected due to baseline poor pulmonary function (likely worse than last PFTs with recalcitrant smoking). Note was seen by Palliative as outpatient. Will consult pulmonology for further recommendations. Low threshold to involve palliative care if she fails to improve.  - Completes doxycycline this AM  - Continue Solumedrol 40mg  IV BID, or per pulmonology recommendations.  - Ipratroprium-levalbuterol q6h + prn albuterol. Continues on pulmicort as well  Tobacco use: Long-standing history.  - Cessation counseling provided - Nicotine patch 21 mg ordered - Low dose lung CT follow up in Oct 2021 (April 2021 Lung-RADS 3s)  Sinus tachycardia: Troponin negative. - Improved overall. Last TSH was  wnl.  Hypocalcemia: Possibly due to chronic metabolic alkalosis, improved. Albumin wnl.  Depression: Mood stable - Continue wellbutrin, remeron.   DVT prophylaxis: Lovenox Code Status: Partial, DNI Family Communication: None at bedside Disposition Plan:  Status is: Inpatient  Remains inpatient appropriate because:Inpatient level of care appropriate due to severity of illness  Dispo: The patient is from: Home              Anticipated d/c is to: Home              Anticipated d/c date is: 2-3 days depending on improvement               Patient currently is not medically stable to d/c.  Consultants:  Pulmonology 6/30  Procedures:   None  Antimicrobials:  Doxycycline x10 doses  Subjective: Intermittent dyspnea associated with wheezing continues. Not appreciably improved at all from yesterday, though not in distress as she was at admission. No chest pain. No leg swelling. Getting up to bathroom.  Objective: Vitals:   12/15/19 0200 12/15/19 0453 12/15/19 0736 12/15/19 0740  BP:  (!) 136/91    Pulse:  95    Resp:  17    Temp: (!) 97.4 F (36.3 C) 97.7 F (36.5 C)    TempSrc: Oral Oral    SpO2: 99% 100% 99% 99%  Weight:      Height:        Intake/Output Summary (Last 24 hours) at 12/15/2019 1239 Last data filed at 12/15/2019 1114 Gross per 24 hour  Intake 1700 ml  Output --  Net 1700 ml   Filed Weights   12/11/19 0648  Weight: 48.5 kg   Gen: 59 y.o. female in no distress Pulm: Nonlabored breathing 4L O2 with panexpiratory coarse wheezing, no crackles. CV:  Regular borderline tachycardia. No murmur, rub, or gallop. No JVD, no dependent edema. GI: Abdomen soft, non-tender, non-distended, with normoactive bowel sounds.  Ext: Warm, no deformities Skin: No rashes, lesions or ulcers on visualized skin. Neuro: Alert and oriented. No focal neurological deficits. Psych: Judgement and insight appear fair. Mood euthymic & affect congruent. Behavior is appropriate.     Data Reviewed: I have personally reviewed following labs and imaging studies  CBC: Recent Labs  Lab 12/10/19 2004 12/11/19 0500  WBC 7.3 3.6*  NEUTROABS 3.8  --   HGB 11.7* 12.2  HCT 35.8* 38.6  MCV 87.5 87.3  PLT 188 621   Basic Metabolic Panel: Recent Labs  Lab 12/10/19 2004 12/11/19 0500  NA 132* 137  K 3.6 4.3  CL 94* 96*  CO2 28 30  GLUCOSE 113* 176*  BUN <5* 8  CREATININE 0.52 0.66  CALCIUM 7.9* 8.6*   GFR: Estimated Creatinine Clearance: 57.3 mL/min (by C-G formula based on SCr of 0.66 mg/dL). Liver Function Tests: Recent Labs  Lab 12/11/19 0500  AST 24  ALT 12  ALKPHOS 79  BILITOT 0.4  PROT 6.9  ALBUMIN 4.0   No results for input(s): LIPASE, AMYLASE in the last 168 hours. No results for input(s): AMMONIA in the last 168 hours. Coagulation Profile: No results for input(s): INR, PROTIME in the last 168 hours. Cardiac Enzymes: No results for input(s): CKTOTAL, CKMB, CKMBINDEX, TROPONINI in the last 168 hours. BNP (last 3 results) No results for input(s): PROBNP in the last 8760 hours. HbA1C: No results for input(s): HGBA1C in the last 72 hours. CBG: No results for input(s): GLUCAP in the last 168 hours. Lipid Profile: No results for input(s): CHOL, HDL, LDLCALC, TRIG, CHOLHDL, LDLDIRECT in the last 72 hours. Thyroid Function Tests: No results for input(s): TSH, T4TOTAL, FREET4, T3FREE, THYROIDAB in the last 72 hours. Anemia Panel: No results for input(s): VITAMINB12, FOLATE, FERRITIN, TIBC, IRON, RETICCTPCT in the last 72 hours. Urine analysis:    Component Value Date/Time   COLORURINE STRAW (A) 09/11/2019 0224   APPEARANCEUR CLEAR 09/11/2019 0224   LABSPEC 1.002 (L) 09/11/2019 0224   PHURINE 6.0 09/11/2019 0224   GLUCOSEU NEGATIVE 09/11/2019 0224   HGBUR SMALL (A) 09/11/2019 0224   BILIRUBINUR NEGATIVE 09/11/2019 0224   KETONESUR NEGATIVE 09/11/2019 0224   PROTEINUR NEGATIVE 09/11/2019 0224   NITRITE NEGATIVE 09/11/2019 0224    LEUKOCYTESUR NEGATIVE 09/11/2019 0224   Recent Results (from the past 240 hour(s))  SARS Coronavirus 2 by RT PCR (hospital order, performed in [facility] hospital lab) Nasopharyngeal Nasopharyngeal Swab     Status: None   Collection Time: 12/10/19  8:05 PM   Specimen: Nasopharyngeal Swab  Result Value Ref Range Status   SARS Coronavirus 2 NEGATIVE NEGATIVE Final    Comment: (NOTE) SARS-CoV-2 target nucleic acids are NOT DETECTED.  The SARS-CoV-2 RNA is generally detectable in upper and lower respiratory specimens during the acute phase of infection. The lowest concentration of SARS-CoV-2 viral copies this assay can detect is 250 copies / mL. A negative result does not preclude SARS-CoV-2 infection and should not be used as the sole basis for treatment or other patient management decisions.  A negative result may occur with improper specimen collection / handling, submission of specimen other than nasopharyngeal swab, presence of viral mutation(s) within the areas targeted by this assay, and inadequate number of viral copies (<250 copies / mL). A negative result must be combined with clinical observations, patient history, and epidemiological information.  Fact Sheet for Patients:   StrictlyIdeas.no  Fact Sheet for Healthcare Providers: BankingDealers.co.za  This test is not yet approved or  cleared by the [place] FDA and has been authorized for detection and/or diagnosis of SARS-CoV-2 by FDA under an Emergency Use Authorization (EUA).  This EUA will remain in effect (meaning this test can be used) for the duration of the COVID-19 declaration under Section 564(b)(1) of the Act, 21 U.S.C. section 360bbb-3(b)(1), unless the authorization is terminated or revoked sooner.  Performed at [facility], [place] [address] [place], [place] [address]       Radiology Studies: No results found.  Scheduled Meds: .  vitamin C with rose hips  500 mg Oral Daily  . aspirin EC  81 mg Oral Daily  . budesonide (PULMICORT) nebulizer solution  0.25 mg Nebulization BID  . buPROPion  150 mg Oral Daily  . cholecalciferol  1,000 Units Oral Daily  . enoxaparin (LOVENOX) injection  30 mg Subcutaneous Q24H  . gabapentin  100 mg Oral BID  . ipratropium  0.5 mg Nebulization Q6H  . levalbuterol  0.63 mg Nebulization Q6H  . linaclotide  290 mcg Oral QAC breakfast  . methylPREDNISolone (SOLU-MEDROL) injection  40 mg Intravenous Q12H  . mirtazapine  15 mg Oral QHS  . multivitamin with minerals  1 tablet Oral Daily  . nicotine  21 mg Transdermal Daily  . pantoprazole  40 mg Oral Daily  . sertraline  150 mg Oral Daily   Continuous Infusions:   LOS: 5 days   Time spent: 25 minutes.  Patrecia Pour, MD [facility] [email or web address] 12/15/2019, 12:39 PM

## 2019-12-15 NOTE — Plan of Care (Signed)
  Problem: Respiratory: Goal: Ability to maintain a clear airway will improve Outcome: Progressing   Problem: Education: Goal: Knowledge of General Education information will improve Description: Including pain rating scale, medication(s)/side effects and non-pharmacologic comfort measures Outcome: Progressing   Problem: Health Behavior/Discharge Planning: Goal: Ability to manage health-related needs will improve Outcome: Progressing   Problem: Clinical Measurements: Goal: Ability to maintain clinical measurements within normal limits will improve Outcome: Progressing Goal: Diagnostic test results will improve Outcome: Progressing   Problem: Nutrition: Goal: Adequate nutrition will be maintained Outcome: Progressing   Problem: Coping: Goal: Level of anxiety will decrease Outcome: Progressing   Problem: Pain Managment: Goal: General experience of comfort will improve Outcome: Progressing

## 2019-12-15 NOTE — Evaluation (Signed)
Physical Therapy Evaluation Patient Details Name: Christine Serrano MRN: [id number] DOB: 1960-10-24 Today's Date: 12/15/2019   History of Present Illness  59 yo female admitted with acute resp failure. Hx of COPD-O2 dep @ baseline, cervical cancer.  Clinical Impression  On eval, pt was Supv-Mod I with mobility. She walked ~120 feet around the unit. O2 94% on 4LNC. Pt tolerated activity fairly well. Will continue to follow during hospital stay. Do not anticipate any f/u PT needs after discharge-pt agrees.    Follow Up Recommendations No PT follow up    Equipment Recommendations  None recommended by PT    Recommendations for Other Services       Precautions / Restrictions Precautions Precautions: Fall Precaution Comments: O2 dependent @ baseline Restrictions Weight Bearing Restrictions: No      Mobility  Bed Mobility Overal bed mobility: Modified Independent                Transfers Overall transfer level: Modified independent                  Ambulation/Gait Ambulation/Gait assistance: Supervision Gait Distance (Feet): 120 Feet Assistive device:  (pt pushed O2 carrier) Gait Pattern/deviations: Step-through pattern;Decreased stride length     General Gait Details: Intermittent mild unsteadiness. No LOB. O2 94% on 4L  Stairs            Wheelchair Mobility    Modified Rankin (Stroke Patients Only)       Balance                                             Pertinent Vitals/Pain Pain Assessment: Faces Faces Pain Scale: Hurts little more Pain Location: shoulder-chronic Pain Descriptors / Indicators: Aching Pain Intervention(s): Monitored during session (RN to give meds after session)    Home Living Family/patient expects to be discharged to:: Private residence   Available Help at Discharge: Friend(s);Available PRN/intermittently Type of Home: Mobile home Home Access: Stairs to enter Entrance Stairs-Rails:  None Entrance Stairs-Number of Steps: 2 Home Layout: One level Home Equipment: None;Grab bars - toilet Additional Comments: Uses  O2 at home    Prior Function Level of Independence: Needs assistance   Gait / Transfers Assistance Needed: Able to ambulate short community distances due to dyspnea  ADL's / Homemaking Assistance Needed: Reports friend occasionally assist with showers due to [facility]; able to do light IADLs        Hand Dominance        Extremity/Trunk Assessment   Upper Extremity Assessment Upper Extremity Assessment: Overall WFL for tasks assessed    Lower Extremity Assessment Lower Extremity Assessment: Generalized weakness    Cervical / Trunk Assessment Cervical / Trunk Assessment: Kyphotic  Communication   Communication: No difficulties  Cognition Arousal/Alertness: Awake/alert Behavior During Therapy: WFL for tasks assessed/performed Overall Cognitive Status: Within Functional Limits for tasks assessed                                        General Comments      Exercises     Assessment/Plan    PT Assessment Patient needs continued PT services  PT Problem List Decreased mobility;Decreased activity tolerance       PT Treatment Interventions Gait training;Therapeutic activities;Therapeutic exercise;Patient/family education;Functional mobility training  PT Goals (Current goals can be found in the Care Plan section)  Acute Rehab PT Goals Patient Stated Goal: home soon. get better. PT Goal Formulation: With patient Time For Goal Achievement: 12/29/19 Potential to Achieve Goals: Good    Frequency Min 3X/week   Barriers to discharge        Co-evaluation               AM-PAC PT "6 Clicks" Mobility  Outcome Measure Help needed turning from your back to your side while in a flat bed without using bedrails?: None Help needed moving from lying on your back to sitting on the side of a flat bed without using bedrails?:  None Help needed moving to and from a bed to a chair (including a wheelchair)?: None Help needed standing up from a chair using your arms (e.g., wheelchair or bedside chair)?: None Help needed to walk in hospital room?: A Little Help needed climbing 3-5 steps with a railing? : A Little 6 Click Score: 22    End of Session Equipment Utilized During Treatment: Oxygen Activity Tolerance: Patient tolerated treatment well Patient left: in bed;with call bell/phone within reach   PT Visit Diagnosis: Difficulty in walking, not elsewhere classified (R26.2)    Time: 5868-2574 PT Time Calculation (min) (ACUTE ONLY): 9 min   Charges:   PT Evaluation $PT Eval Low Complexity: 1 Low            Doreatha Massed, PT Acute Rehabilitation  Office: [phone] Pager: [phone]

## 2019-12-15 NOTE — Consult Note (Addendum)
NAME:  Christine Serrano, MRN:  [id number], DOB:  1960/09/14, LOS: 5 ADMISSION DATE:  12/10/2019, CONSULTATION DATE: 6/30 REFERRING MD:  Dr. Bonner Puna, CHIEF COMPLAINT:  SOB    Brief History   59 y/o F with severe COPD, 4L O2 dependent admitted 6/25 after running out of O2 and developed respiratory distress.    History of present illness   59 y/o F, smoker (>100 pack years, began smoking at age 78) who presneted to [facility] on 6/25 with respiratory distress.    At baseline, she lives with room mates.  One room mate is her HCPOA.  She continues to smoke but has desire to quit.  She is a former heavy drinker but quit in 2017.  Will drink an occasional hard seltzer.  She is largely confined to a chair. She states she can no longer "do the things she enjoys like riding her hoarse".  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  She wishes she had a motorized wheelchair so she could be more active. One month prior to admit, she was treated with doxycycline for an AECOPD by her primary care MD.  She states she has at least one exacerbation per year requiring steroids and antibiotics.  She endorses difficulty with inhaling medications and meal related dyspnea / poor intake.   The patient was reportedly out fishing with a friend when her O2 tank ran out.  She initially though she would be ok as she was sitting on a bench but had to walk to the car and became distressed.  EMS was activated and found her tripoding, with saturations of 65-70%, and unable to speak in full sentences.  She states she has not had her COVID vaccine. COVID testing negative.  Initial CXR 6/25 negative for acute infiltrates.  She was noted to have wheezing bilaterally on presentation.  She was admitted per [facility] for suspected acute exacerbation of COPD. She has been treated with IV steroids, nebulized bronchodilators and doxycycline.  The patient was slow to resolve, PCCM consulted for evaluation.   Past Medical  History  Depression  Chronic Constipation  ETOH Abuse  Tobacco Abuse  Chronic O2 Dependent Respiratory Failure - 4L baseline  Severe COPD - Spirometry 01/2018 with FVC 1.42 (48%), FEV1 0.76 (31%), FEV1/FVC 53% RUL PNA - 2018  Significant Hospital Events   6/25 Admit  6/30 PCCM consulted  Consults:    Procedures:    Significant Diagnostic Tests:  Low Dose CT Screening 12/10/19 >> emphysema, bilateral upper lobe nodular densities, possible scarring (seen on CT 09/2019), intralobular septal thickening, suspected pulmonary hypertension  Micro Data:  COVID 6/25 >> negative   Antimicrobials:  Doxycycline 6/26 >> 6/30   Interim history/subjective:  Afebrile  On 4L [place] O2 (baseline) Patient reports she feels better but not back to her baseline.  Objective   Blood pressure 129/81, pulse (!) 110, temperature 98.6 F (37 C), temperature source Oral, resp. rate 18, height 5' 4.5" (1.638 m), weight 48.5 kg, SpO2 98 %.        Intake/Output Summary (Last 24 hours) at 12/15/2019 1500 Last data filed at 12/15/2019 1400 Gross per 24 hour  Intake 2060 ml  Output --  Net 2060 ml   Filed Weights   12/11/19 0648  Weight: 48.5 kg    Examination: General: chronically ill appearing adult female sitting up in bed in NAD HEENT: MM pink/moist, [place] O2, anicteric  Neuro: AAOx4, speech clear,  MAE   CV: s1s2 RRR, tachy, no m/r/g PULM: prolonged exp phase, lungs bilaterally with rhonchi, no significant wheeze GI: soft, bsx4 active  Extremities: warm/dry, no edema  Skin: no rashes or lesions  Resolved Hospital Problem list     Assessment & Plan:   Severe Obstructive Lung Disease with Emphysema  Acute on Chronic Hypoxic Respiratory Failure  Tobacco Abuse  -adjust pulmicort dosing to 0.5  -add brovana  -continue xopenex, atrovent Q6 -wean O2 for sats 88-95% -assess ambulatory O2 needs prior to discharge  -pulmonary hygiene - IS, mobilize  -add flutter valve, mucinex  -suspect she is  at a point that she can no longer adequately inhale her HFA type medications, she likely needs nebulized regimen for home -continue solumedrol with wean to off  -follow intermittent CXR -follow up low dose CT screening  -reviewed no intubation with patient, agree she should not be intubated due to severity of underlying lung disease -smoking cessation counseling  -she may need exacerbation prevention agent such as daliresp / azithromycin in the near future -reviewed symptom control with non-pulmonary agents (morphine, ativan) etc -consider palliative care consultation for symptom control   Best practice:  Diet: Regular  DVT prophylaxis: lovenox  GI prophylaxis: PPI  Glucose control: n/a  Mobility: as tolerated  Code Status: Limited Code  Family Communication: Patient updated on plan of care 6/30 Disposition: Per primary   Labs   CBC: Recent Labs  Lab 12/10/19 2004 12/11/19 0500  WBC 7.3 3.6*  NEUTROABS 3.8  --   HGB 11.7* 12.2  HCT 35.8* 38.6  MCV 87.5 87.3  PLT 188 703    Basic Metabolic Panel: Recent Labs  Lab 12/10/19 2004 12/11/19 0500  NA 132* 137  K 3.6 4.3  CL 94* 96*  CO2 28 30  GLUCOSE 113* 176*  BUN <5* 8  CREATININE 0.52 0.66  CALCIUM 7.9* 8.6*   GFR: Estimated Creatinine Clearance: 57.3 mL/min (by C-G formula based on SCr of 0.66 mg/dL). Recent Labs  Lab 12/10/19 2004 12/11/19 0500  WBC 7.3 3.6*    Liver Function Tests: Recent Labs  Lab 12/11/19 0500  AST 24  ALT 12  ALKPHOS 79  BILITOT 0.4  PROT 6.9  ALBUMIN 4.0   No results for input(s): LIPASE, AMYLASE in the last 168 hours. No results for input(s): AMMONIA in the last 168 hours.  ABG    Component Value Date/Time   PHART 7.371 05/16/2019 0205   PCO2ART 52.1 (H) 05/16/2019 0205   PO2ART 192 (H) 05/16/2019 0205   HCO3 29.5 (H) 05/16/2019 0205   TCO2 25.9 04/04/2009 1430   O2SAT 99.4 05/16/2019 0205     Coagulation Profile: No results for input(s): INR, PROTIME in the last  168 hours.  Cardiac Enzymes: No results for input(s): CKTOTAL, CKMB, CKMBINDEX, TROPONINI in the last 168 hours.  HbA1C: No results found for: HGBA1C  CBG: No results for input(s): GLUCAP in the last 168 hours.  Review of Systems: Positives in [place]   Gen: Denies fever, chills, weight change, fatigue, night sweats HEENT: Denies blurred vision, double vision, hearing loss, tinnitus, sinus congestion, rhinorrhea, sore throat, neck stiffness, dysphagia PULM: Denies shortness of breath, cough, sputum production, hemoptysis, wheezing CV: Denies chest pain, edema, orthopnea, paroxysmal nocturnal dyspnea, palpitations GI: Denies abdominal pain, nausea, vomiting, diarrhea, hematochezia, melena, constipation, change in bowel habits GU: Denies dysuria, hematuria, polyuria, oliguria, urethral discharge Endocrine: Denies hot or cold intolerance, polyuria, polyphagia or appetite change Derm: Denies rash, dry skin,  scaling or peeling skin change Heme: Denies easy bruising, bleeding, bleeding gums Neuro: Denies headache, numbness, weakness, slurred speech, loss of memory or consciousness   Past Medical History  She,  has a past medical history of Cervical cancer ([place]), Chronic idiopathic constipation, Community acquired pneumonia of right upper lobe of lung (12/10/2016), COPD (chronic obstructive pulmonary disease) ([place]), Depression, Endometriosis, HLD (hyperlipidemia), and Smoker.   Surgical History    Past Surgical History:  Procedure Laterality Date  . ABLATION ON ENDOMETRIOSIS    . APPENDECTOMY    . BREAST EXCISIONAL BIOPSY Left age 46   benign biopsy - chronic fatty deposit  . COLONOSCOPY WITH PROPOFOL N/A 08/04/2017   TAx3, angiodysplastic lesion, diverticulosis, rpt 5 yrs ([place])  . EXPLORATORY LAPAROTOMY  1992   endometriosis  . FOOT SURGERY Right    needle imbeded  . LASER ABLATION CONDYLOMA CERVICAL / VULVAR    . NASAL SEPTUM SURGERY Bilateral   . OVARIAN CYST SURGERY  Bilateral 1980   remote  . SALPINGOOPHORECTOMY Right 1983   ectopic pregnancy  . TONSILLECTOMY       Social History   reports that she has been smoking cigarettes. She has a 150.00 pack-year smoking history. She has never used smokeless tobacco. She reports current alcohol use. She reports that she does not use drugs.   Family History   Her family history includes COPD in her father; Colon cancer in her paternal grandmother; Hearing loss in her maternal grandfather and maternal grandmother; Heart disease in her mother; Hypertension in her father, maternal grandfather, maternal grandmother, and mother.   Allergies Allergies  Allergen Reactions  . Terbinafine And Related Diarrhea    Diarrheal illness while on terbinafine ?related to med  . Imitrex [Sumatriptan] Other (See Comments)    Worsens migraine      Home Medications  Prior to Admission medications   Medication Sig Start Date End Date Taking? Authorizing Provider  albuterol (VENTOLIN HFA) 108 (90 Base) MCG/ACT inhaler INHALE 2 PUFFS INTO THE LUNGS EVERY 6  HOURS AS NEEDED FOR WHEEZING OR SHORTNESS OF BREATH. Patient taking differently: Inhale 2 puffs into the lungs every 4 (four) hours as needed for wheezing or shortness of breath.  12/09/19  Yes Ria Bush, MD  Ascorbic Acid (VITAMIN C WITH ROSE HIPS) 500 MG tablet Take 500 mg by mouth daily.   Yes [provider]  aspirin EC 81 MG tablet Take 81 mg by mouth daily.   Yes [provider]  buPROPion (WELLBUTRIN XL) 150 MG 24 hr tablet Take 1 tablet (150 mg total) by mouth daily. 11/10/19  Yes Ria Bush, MD  cholecalciferol (VITAMIN D3) 25 MCG (1000 UNIT) tablet Take 1,000 Units by mouth daily.   Yes [provider]  fluticasone (FLONASE) 50 MCG/ACT nasal spray Place 2 sprays into both nostrils daily. Patient taking differently: Place 2 sprays into both nostrils daily as needed for allergies or rhinitis.  07/12/19  Yes Ria Bush, MD    folic acid (FOLVITE) 1 MG tablet Take 1 tablet (1 mg total) by mouth daily. 10/15/19  Yes Ria Bush, MD  gabapentin (NEURONTIN) 100 MG capsule Take 1 capsule (100 mg total) by mouth 2 (two) times daily. 10/15/19  Yes Ria Bush, MD  hydrOXYzine (ATARAX/VISTARIL) 25 MG tablet TAKE 1 TABLET (25 MG TOTAL) BY MOUTH 2 (TWO) TIMES DAILY AS NEEDED FOR ANXIETY. Patient taking differently: Take 25 mg by mouth 2 (two) times daily as needed for anxiety.  10/02/19  Yes Ria Bush,  MD  ipratropium-albuterol (DUONEB) 0.5-2.5 (3) MG/3ML SOLN Inhale 3 mLs into the lungs every 6 (six) hours as needed. Patient taking differently: Inhale 3 mLs into the lungs every 6 (six) hours as needed (wheezing).  07/13/19  Yes Ria Bush, MD  LINZESS 290 MCG CAPS capsule Take 1 capsule (290 mcg total) by mouth daily as needed (constipation). Patient taking differently: Take 290 mcg by mouth daily before breakfast.  07/13/19  Yes Ria Bush, MD  meclizine (ANTIVERT) 25 MG tablet Take 1 tablet (25 mg total) by mouth 3 (three) times daily as needed for dizziness. 09/12/19  Yes Aline August, MD  methocarbamol (ROBAXIN) 500 MG tablet Take 1 tablet (500 mg total) by mouth 3 (three) times daily as needed for muscle spasms (sedation precautions). 11/10/19  Yes Ria Bush, MD  mirtazapine (REMERON) 15 MG tablet TAKE 1 TABLET BY MOUTH EVERYDAY AT BEDTIME Patient taking differently: Take 15 mg by mouth at bedtime.  12/03/19  Yes Ria Bush, MD  Multiple Vitamin (MULTIVITAMIN WITH MINERALS) TABS tablet Take 1 tablet by mouth daily. 09/12/19  Yes Aline August, MD  naproxen (NAPROSYN) 375 MG tablet Take 1 tablet (375 mg total) by mouth 2 (two) times daily as needed for moderate pain. 10/10/19  Yes Ria Bush, MD  omeprazole (PRILOSEC) 40 MG capsule TAKE 1 CAPSULE BY MOUTH DAILY. FOR THREE WEEKS THEN AS NEEDED. TAKE 30 MIN PRIOR TO LARGE MEAL Patient taking differently: Take 40 mg by mouth  daily.  12/09/19  Yes Ria Bush, MD  sertraline (ZOLOFT) 100 MG tablet Take 1.5 tablets (150 mg total) by mouth daily. 07/19/19  Yes Ria Bush, MD  SPIRIVA HANDIHALER 18 MCG inhalation capsule Place 1 capsule into inhaler and inhale daily.  12/10/19  Yes [provider]  SYMBICORT 160-4.5 MCG/ACT inhaler Inhale 2 puffs into the lungs in the morning and at bedtime.  12/10/19  Yes [provider]  thiamine 100 MG tablet Take 1 tablet (100 mg total) by mouth daily. 09/12/19  Yes Aline August, MD  Calcium Ascorbate 500 MG TABS Take 1 tablet (500 mg total) by mouth daily. Patient not taking: Reported on 12/10/2019 07/01/19   Ria Bush, MD  OXYGEN Inhale 3 L/min into the lungs continuous.    [provider]     Critical care time: n/a     Noe Gens, MSN, NP-[facility] 12/15/2019, 3:00 PM   Please see [email or web address] for pager details.

## 2019-12-16 NOTE — Progress Notes (Signed)
PROGRESS NOTE  ANABETH CHILCOTT  DOB: [birth year]  PCP: Ria Bush, MD QIW:[id number]  DOA: 12/10/2019  LOS: 6 days   Chief Complaint  Patient presents with  . Shortness of Breath    COPD   Brief narrative: Christine L Crawfordis a58 y.o.femalewith a history of severe 4L O2-dependent COPD, tobacco use (>100 pack years, currently 1.5-2 ppd), alcohol abuse currently in remission, and depression. Patient presented to ED on 6/25 with dyspnea, wheezing, cough typical of previous COPD exacerbations.  CXR showed emphysema with some scarring but no infiltrate,  SARS-CoV-2 negative.  Patient was admitted for COPD exacerbation.  Clinically proving with IV steroids, doxycycline, and nebulizers.   Pulmonary consultation was obtained.  Medications adjusted.    Subjective: Patient was seen and examined this morning.  Pleasant middle-aged Caucasian female.  Looks older for her age. Not wheezing today.  Medications being adjusted per pulmonology.  Assessment/Plan: Acute COPD exacerbation Acute on chronic hypoxemic respiratory failure -Presented with dyspnea, wheezing, cough.  No infiltrates on chest x-ray. -Flareup precipitated probably by viral or atypical bacterial bronchitis.   -Clinically proving with IV steroids, doxycycline, and nebulizers.   -Pulmonary consultation was obtained.  Medications adjusted.  Added Brovana, Pulmicort. -Completed the course of doxycycline 5 days. -Remains on Solumedrol 40mg  IV BID and DuoNebs. -Outpatient palliative care evaluation.  Chronic everyday smoker ->100 pack years, continues to smoke around 2 packs/day.  -States she is going to quit now.  - Nicotine patch 21 mg offered. - Low dose lung CT follow up recommended for Oct 2021 (April 2021 Lung-RADS 3s)  Sinus tachycardia:  -Troponin negative. -Improved overall. Last TSH was wnl.  Hypocalcemia:  -Possibly due to chronic metabolic alkalosis, improved. Albumin wnl.  Depression:  -Mood  stable - Continue wellbutrin, remeron.  Mobility: Independent  Nutritional status: Body mass index is 18.08 kg/m.     Diet Order            Diet regular Room service appropriate? Yes; Fluid consistency: Thin  Diet effective now                 Code Status:   Code Status: Partial Code per chart DVT prophylaxis: enoxaparin (LOVENOX) injection 30 mg Start: 12/12/19 1700   Antimicrobials:  None currently Fluid: None currently  Consultants: Pulmonology Family Communication:  None at bedside Status is: Inpatient  Remains inpatient appropriate because:IV treatments appropriate due to intensity of illness or inability to take PO   Dispo:  Patient From: Home  Planned Disposition: Home  Expected discharge date: 12/15/19  Medically stable for discharge: No        Infusions:    Scheduled Meds: . arformoterol  15 mcg Nebulization BID  . vitamin C with rose hips  500 mg Oral Daily  . aspirin EC  81 mg Oral Daily  . budesonide (PULMICORT) nebulizer solution  0.5 mg Nebulization BID  . buPROPion  150 mg Oral Daily  . cholecalciferol  1,000 Units Oral Daily  . enoxaparin (LOVENOX) injection  30 mg Subcutaneous Q24H  . gabapentin  100 mg Oral BID  . guaiFENesin  1,200 mg Oral BID  . ipratropium  0.5 mg Nebulization Q6H  . levalbuterol  0.63 mg Nebulization Q6H  . linaclotide  290 mcg Oral QAC breakfast  . methylPREDNISolone (SOLU-MEDROL) injection  40 mg Intravenous Q12H  . mirtazapine  15 mg Oral QHS  . multivitamin with minerals  1 tablet Oral Daily  . nicotine  21 mg Transdermal Daily  . pantoprazole  40 mg Oral Daily  . sertraline  150 mg Oral Daily    Antimicrobials: Anti-infectives (From admission, onward)   Start     Dose/Rate Route Frequency Ordered Stop   12/11/19 1400  doxycycline (VIBRA-TABS) tablet 100 mg        100 mg Oral Every 12 hours 12/11/19 0845 12/15/19 1101   12/11/19 0200  doxycycline (VIBRAMYCIN) 100 mg in sodium chloride 0.9 % 250 mL  IVPB  Status:  Discontinued        100 mg 125 mL/hr over 120 Minutes Intravenous Every 12 hours 12/11/19 0000 12/11/19 0845      PRN meds: albuterol, ALPRAZolam, hydrOXYzine, levalbuterol, meclizine, methocarbamol, ondansetron **OR** ondansetron (ZOFRAN) IV   Objective: Vitals:   12/16/19 0803 12/16/19 0807  BP:    Pulse:    Resp:    Temp:    SpO2: 98% 98%    Intake/Output Summary (Last 24 hours) at 12/16/2019 1317 Last data filed at 12/16/2019 0835 Gross per 24 hour  Intake 1960 ml  Output --  Net 1960 ml   Filed Weights   12/11/19 0648  Weight: 48.5 kg   Weight change:  Body mass index is 18.08 kg/m.   Physical Exam: General exam: Appears calm and comfortable.  Not in distress Skin: No rashes, lesions or ulcers. HEENT: Atraumatic, normocephalic, supple neck, no obvious bleeding Lungs: Diminished air entry bilaterally in bases, mild expiratory wheezing scattered CVS: Regular rate and rhythm, no murmur GI/Abd soft, nontender, nondistended, bowel sound present CNS: Alert, awake monitor x3 Psychiatry: Mood appropriate Extremities: No pedal edema, no calf tenderness  Data Review: I have personally reviewed the laboratory data and studies available.  Recent Labs  Lab 12/10/19 2004 12/11/19 0500  WBC 7.3 3.6*  NEUTROABS 3.8  --   HGB 11.7* 12.2  HCT 35.8* 38.6  MCV 87.5 87.3  PLT 188 203   Recent Labs  Lab 12/10/19 2004 12/11/19 0500  NA 132* 137  K 3.6 4.3  CL 94* 96*  CO2 28 30  GLUCOSE 113* 176*  BUN <5* 8  CREATININE 0.52 0.66  CALCIUM 7.9* 8.6*    Signed, Terrilee Croak, MD [facility] Pager: [phone] (Secure Chat preferred). 12/16/2019

## 2019-12-16 NOTE — Care Management Important Message (Signed)
Important Message  Patient Details IM Letter presented to the Patient Name: Christine Serrano MRN: [id number] Date of Birth: Dec 20, 1960   Medicare Important Message Given:  Yes     Kerin Salen 12/16/2019, 10:52 AM

## 2019-12-16 NOTE — Plan of Care (Signed)
  Problem: Education: Goal: Knowledge of the prescribed therapeutic regimen will improve Outcome: Progressing Goal: Individualized Educational Video(s) Outcome: Progressing   Problem: Respiratory: Goal: Ability to maintain a clear airway will improve Outcome: Progressing Goal: Levels of oxygenation will improve Outcome: Progressing Goal: Ability to maintain adequate ventilation will improve Outcome: Progressing   Problem: Education: Goal: Knowledge of General Education information will improve Description: Including pain rating scale, medication(s)/side effects and non-pharmacologic comfort measures Outcome: Progressing   Problem: Health Behavior/Discharge Planning: Goal: Ability to manage health-related needs will improve Outcome: Progressing   Problem: Clinical Measurements: Goal: Ability to maintain clinical measurements within normal limits will improve Outcome: Progressing Goal: Will remain free from infection Outcome: Progressing Goal: Diagnostic test results will improve Outcome: Progressing Goal: Respiratory complications will improve Outcome: Progressing Goal: Cardiovascular complication will be avoided Outcome: Progressing   Problem: Activity: Goal: Risk for activity intolerance will decrease Outcome: Progressing   Problem: Nutrition: Goal: Adequate nutrition will be maintained Outcome: Progressing   Problem: Coping: Goal: Level of anxiety will decrease Outcome: Progressing   Problem: Elimination: Goal: Will not experience complications related to bowel motility Outcome: Progressing Goal: Will not experience complications related to urinary retention Outcome: Progressing   Problem: Pain Managment: Goal: General experience of comfort will improve Outcome: Progressing   Problem: Safety: Goal: Ability to remain free from injury will improve Outcome: Progressing   Problem: Skin Integrity: Goal: Risk for impaired skin integrity will  decrease Outcome: Progressing

## 2019-12-16 NOTE — Progress Notes (Signed)
NAME:  Christine Serrano, MRN:  [id number], DOB:  06/13/1961, LOS: 6 ADMISSION DATE:  12/10/2019, CONSULTATION DATE:  12/15/2019 REFERRING MD:  Dr Bonner Puna, CHIEF COMPLAINT:  SOB   Brief History    59 y/o F with severe COPD, 4L O2 dependent admitted 6/25 after running out of O2 and developed respiratory distress.   History of present illness    59 y/o F, smoker (>100 pack years, began smoking at age 40) who presneted to [facility] on 6/25 with respiratory distress.    At baseline, she lives with room mates.  One room mate is her HCPOA.  She continues to smoke but has desire to quit.  She is a former heavy drinker but quit in 2017.  Will drink an occasional hard seltzer.  She is largely confined to a chair. She states she can no longer "do the things she enjoys like riding her hoarse".  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  She wishes she had a motorized wheelchair so she could be more active. One month prior to admit, she was treated with doxycycline for an AECOPD by her primary care MD.  She states she has at least one exacerbation per year requiring steroids and antibiotics.  She endorses difficulty with inhaling medications and meal related dyspnea / poor intake.   The patient was reportedly out fishing with a friend when her O2 tank ran out.  She initially though she would be ok as she was sitting on a bench but had to walk to the car and became distressed.  EMS was activated and found her tripoding, with saturations of 65-70%, and unable to speak in full sentences.  She states she has not had her COVID vaccine. COVID testing negative.  Initial CXR 6/25 negative for acute infiltrates.  She was noted to have wheezing bilaterally on presentation.  She was admitted per [facility] for suspected acute exacerbation of COPD. She has been treated with IV steroids, nebulized bronchodilators and doxycycline.  The patient was slow to resolve, PCCM consulted for evaluation.  Past Medical  History   Past Medical History:  Diagnosis Date  . Cervical cancer ([place])   . Chronic idiopathic constipation   . Community acquired pneumonia of right upper lobe of lung 12/10/2016  . COPD (chronic obstructive pulmonary disease) ([place])   . Depression   . Endometriosis   . HLD (hyperlipidemia)   . Smoker     Significant Hospital Events   Improving  Consults:  6/25 admitted 6/30 critical care Procedures:    Significant Diagnostic Tests:   Low Dose CT Screening 12/10/19 >> emphysema, bilateral upper lobe nodular densities, possible scarring (seen on CT 09/2019), intralobular septal thickening, suspected pulmonary hypertension  Micro Data:   COVID 6/25 >> negative  Antimicrobials:    Doxycycline 6/26 >> 6/30  Interim history/subjective:   On oxygen supplementation Slowly improving Objective   Blood pressure 136/87, pulse (!) 104, temperature 98.3 F (36.8 C), resp. rate 18, height 5' 4.5" (1.638 m), weight 48.5 kg, SpO2 98 %.        Intake/Output Summary (Last 24 hours) at 12/16/2019 1113 Last data filed at 12/16/2019 0835 Gross per 24 hour  Intake 2320 ml  Output --  Net 2320 ml   Filed Weights   12/11/19 0648  Weight: 48.5 kg    Examination: General: Middle-aged lady, frail HENT: Moist oral mucosa Lungs: Decreased air entry bilaterally with rhonchi Cardiovascular: S1-S2 appreciated  Abdomen: Bowel sounds appreciated   Chest x-ray reviewed by myself CT 10/13/2019-extensive emphysematous process Recent PFT 3 years ago reviewed-2019  [facility] Problem list     Assessment & Plan:  Severe obstructive lung disease Emphysema Acute on chronic respiratory failure Tobacco abuse  -Pulmicort added -Brovana added -Continue oxygen supplementation  Hospice/palliative care involvement will be appropriate  Importance of regular exercises and musculoskeletal rehab discussed Smoking cessation counseling  Patient may be able to use Pulmicort and Brovana  in a nebulizer at home Adding Daliresp will also be helpful  Anticipate being able to discharge patient 12/17/2019   Sherrilyn Rist, MD [address] [facility] Pager: [phone]

## 2019-12-17 ENCOUNTER — Telehealth: Payer: Self-pay

## 2019-12-17 ENCOUNTER — Telehealth (HOSPITAL_COMMUNITY): Payer: Self-pay

## 2019-12-17 MED ORDER — BUDESONIDE 0.5 MG/2ML IN SUSP: 0.5000 mg | Freq: Two times a day (BID) | RESPIRATORY_TRACT | 0 refills | Status: DC

## 2019-12-17 MED ORDER — GUAIFENESIN ER 600 MG PO TB12: 600.0000 mg | ORAL_TABLET | Freq: Two times a day (BID) | ORAL | 0 refills | Status: AC

## 2019-12-17 MED ORDER — BUPROPION HCL ER (XL) 150 MG PO TB24: 150.0000 mg | ORAL_TABLET | Freq: Every day | ORAL | 1 refills | Status: DC

## 2019-12-17 MED ORDER — PREDNISONE 10 MG PO TABS
ORAL_TABLET | ORAL | 0 refills | Status: DC
Start: 2019-12-17 — End: 2020-01-07

## 2019-12-17 MED ORDER — ALBUTEROL SULFATE (2.5 MG/3ML) 0.083% IN NEBU: 2.5000 mg | INHALATION_SOLUTION | RESPIRATORY_TRACT | 12 refills | Status: DC | PRN

## 2019-12-17 MED ORDER — ARFORMOTEROL TARTRATE 15 MCG/2ML IN NEBU: 15.0000 ug | INHALATION_SOLUTION | Freq: Two times a day (BID) | RESPIRATORY_TRACT | 0 refills | Status: DC

## 2019-12-17 MED ORDER — METHOCARBAMOL 500 MG PO TABS: 500.0000 mg | ORAL_TABLET | Freq: Three times a day (TID) | ORAL | 1 refills | Status: DC | PRN

## 2019-12-17 MED ORDER — MECLIZINE HCL 25 MG PO TABS: 25.0000 mg | ORAL_TABLET | Freq: Three times a day (TID) | ORAL | 0 refills | Status: DC | PRN

## 2019-12-17 NOTE — Telephone Encounter (Signed)
[place] pool does not come up for me as an option to forward messages too. Please forward to this pool so patient can get worked in on provider's schedule in the next 2 weeks. Thanks!

## 2019-12-17 NOTE — Telephone Encounter (Signed)
Last Prolia injection 07-29-19.  Pt has [place],  [place] exp 06-16-20.  Pt owes approximately $260, ($240=20% for Prolia/$20 admin fee).

## 2019-12-17 NOTE — Discharge Summary (Signed)
Physician Discharge Summary  Christine Serrano IEP:[id number] DOB: 01/21/1961 DOA: 12/10/2019  PCP: Ria Bush, MD  Admit date: 12/10/2019 Discharge date: 12/17/2019  Admitted From: Home Discharge disposition: Home   Code Status: Partial Code  Diet Recommendation: Regular diet  Recommendations for Outpatient Follow-Up:   1. Follow-up with PCP as an outpatient 2. Follow-up with palliative care, pulmonary rehab  Discharge Diagnosis:   Principal Problem:   Acute respiratory failure (HCC) Active Problems:   COPD exacerbation (HCC)   Acute respiratory failure with hypoxemia (HCC)    History of Present Illness / Brief narrative:  Christine L Crawfordis a59 y.o.femalewith a history ofsevere4L O2-dependent COPD, tobacco use (>100 pack years, currently 1.5-2 ppd), alcohol abuse currently in remission, and depression. Patient presented to ED on 6/25 with dyspnea, wheezing, cough typical of previous COPD exacerbations.  CXR showed emphysema with some scarring but no infiltrate,  SARS-CoV-2 negative.  Patient was admitted for COPD exacerbation.  Clinically proving with IV steroids, doxycycline, and nebulizers.   Pulmonary consultation was obtained.  Medications adjusted.    Hospital Course:  Acute COPD exacerbation Acute on chronic hypoxemic respiratory failure -Presented with dyspnea, wheezing, cough.  No infiltrates on chest x-ray. -Flareup precipitated probably by viral or atypical bacterial bronchitis.   -Clinically improving with IV steroids, doxycycline, and nebulizers.   -Completed the course of doxycycline 5 days. -Remains on Solumedrol 40mg  IV BID.  Switch to a tapering course of oral prednisone  at discharge. -Pulmonary consultation was obtained.  Medications adjusted. -Per pulmonary recommendations, discharge medication plan > pulmicort 0.5 mg BID + brovana 15 mcg BID nebulized.  She should take the paper prescription to the DME company that supplies her oxygen  and they will fill her nebs (this comes out of a different portion of medicare) discussed back up plan for over the weekend in the event she can no get her nebs to continue to take symbicort + spiriva and duoneb Q6 until she can get her new nebs filled.  She understands not to take Brovana/Pulmiort with symbicort/spiriva -Outpatient palliative care evaluation referral ordered. -We will need rehab ordered. -hospital follow up appt made for Dr. Patsey Berthold in [facility] > July 15th at 4pm   Chronic everyday smoker ->100 pack years, continues to smoke around 2 packs/day.  -states she is going to quit now.  -Nicotine patch 21 mg offered. -Low dose lung CT follow up recommended for Oct 2021 (April 2021 Lung-RADS 3s)  Sinus tachycardia:  -Troponin negative. -Improved overall. Last TSH was wnl.  Hypocalcemia:  -Possibly due to chronic metabolic alkalosis, improved. Albumin wnl.  Depression:  -Mood stable -Continue wellbutrin, remeron.  Mobility: Independent  Nutritional status: Regular diet. Body mass index is 18.08 kg/m.   Code Status:  Code Status: Partial Code per chart DVT prophylaxis: enoxaparin (LOVENOX) injection 30 mg Start: 12/12/19 1700   Antimicrobials: None currently Fluid: None currently  Consultants: Pulmonology Family Communication: None at bedside   Wound care: None     Subjective:  Seen and examined this morning.  Pleasant, middle-aged Caucasian female.  Lying on bed.  Not in distress.  Feels better.  Wants to go home.  Discharge Exam:   Vitals:   12/17/19 0521 12/17/19 0746 12/17/19 0756 12/17/19 0805  BP: 127/79     Pulse: 100     Resp: 18     Temp: 98.6 F (37 C)     TempSrc: Oral     SpO2: 100% 100% 100% 100%  Weight:  Height:        Body mass index is 18.08 kg/m.  General exam: Appears calm and comfortable.  Skin: No rashes, lesions or ulcers. HEENT: Atraumatic, normocephalic, supple neck, no obvious bleeding Lungs: Clear  to auscultation bilaterally except for mild scattered end expiratory wheezing CVS: Regular rate and rhythm, no murmur GI/Abd soft, nontender, nondistended, bowel sound present CNS: Alert, awake, oriented x3 Psychiatry: Mood appropriate Extremities: No pedal edema, no calf tenderness  Discharge Instructions:   Discharge Instructions    AMB referral to pulmonary rehabilitation   Complete by: As directed    Please select a program: Pulmonary Rehabilitation (COPD Gold 2,3,4)   COPD Diagnosis: (See requirements below): COPD-Gold 2: Moderate 50% </= FEV1 <80% predicted   After initial evaluation and assessments completed: Virtual Based Care may be provided alone or in conjunction with Pulmonary Rehab/Respiratory Care services based on patient barriers.: Yes   Amb Referral to Palliative Care   Complete by: As directed    Diet general   Complete by: As directed    Increase activity slowly   Complete by: As directed       Follow-up Information    Christine Pita, MD Follow up on 12/30/2019.   Specialty: Pulmonary Disease Why: Appt at 4PM.  Please arrive at 3:45 for check in.   Contact information: [address] [place] [address] [phone]        Ria Bush, MD Follow up.   Specialty: Family Medicine Contact information: [place] [address] [phone]              Allergies as of 12/17/2019      Reactions   Terbinafine And Related Diarrhea   Diarrheal illness while on terbinafine ?related to med   Imitrex [sumatriptan] Other (See Comments)   Worsens migraine       Medication List    STOP taking these medications   albuterol 108 (90 Base) MCG/ACT inhaler Commonly known as: VENTOLIN HFA Replaced by: albuterol (2.5 MG/3ML) 0.083% nebulizer solution   ipratropium-albuterol 0.5-2.5 (3) MG/3ML Soln Commonly known as: DUONEB   Spiriva HandiHaler 18 MCG inhalation capsule Generic drug: tiotropium   Symbicort  160-4.5 MCG/ACT inhaler Generic drug: budesonide-formoterol     TAKE these medications   albuterol (2.5 MG/3ML) 0.083% nebulizer solution Commonly known as: PROVENTIL Take 3 mLs (2.5 mg total) by nebulization every 2 (two) hours as needed for wheezing. Replaces: albuterol 108 (90 Base) MCG/ACT inhaler   arformoterol 15 MCG/2ML Nebu Commonly known as: BROVANA Take 2 mLs (15 mcg total) by nebulization 2 (two) times daily.   aspirin EC 81 MG tablet Take 81 mg by mouth daily.   budesonide 0.5 MG/2ML nebulizer solution Commonly known as: PULMICORT Take 2 mLs (0.5 mg total) by nebulization 2 (two) times daily.   buPROPion 150 MG 24 hr tablet Commonly known as: Wellbutrin XL Take 1 tablet (150 mg total) by mouth daily.   Calcium Ascorbate 500 MG Tabs Take 1 tablet (500 mg total) by mouth daily.   cholecalciferol 25 MCG (1000 UNIT) tablet Commonly known as: VITAMIN D3 Take 1,000 Units by mouth daily.   fluticasone 50 MCG/ACT nasal spray Commonly known as: FLONASE Place 2 sprays into both nostrils daily. What changed:   when to take this  reasons to take this   folic acid 1 MG tablet Commonly known as: FOLVITE Take 1 tablet (1 mg total) by mouth daily.   gabapentin 100 MG capsule  Commonly known as: NEURONTIN Take 1 capsule (100 mg total) by mouth 2 (two) times daily.   guaiFENesin 600 MG 12 hr tablet Commonly known as: MUCINEX Take 1 tablet (600 mg total) by mouth 2 (two) times daily.   hydrOXYzine 25 MG tablet Commonly known as: ATARAX/VISTARIL TAKE 1 TABLET (25 MG TOTAL) BY MOUTH 2 (TWO) TIMES DAILY AS NEEDED FOR ANXIETY. What changed: See the new instructions.   Linzess 290 MCG Caps capsule Generic drug: linaclotide Take 1 capsule (290 mcg total) by mouth daily as needed (constipation). What changed: when to take this   meclizine 25 MG tablet Commonly known as: ANTIVERT Take 1 tablet (25 mg total) by mouth 3 (three) times daily as needed for dizziness.     methocarbamol 500 MG tablet Commonly known as: Robaxin Take 1 tablet (500 mg total) by mouth 3 (three) times daily as needed for muscle spasms (sedation precautions).   mirtazapine 15 MG tablet Commonly known as: REMERON TAKE 1 TABLET BY MOUTH EVERYDAY AT BEDTIME What changed: See the new instructions.   multivitamin with minerals Tabs tablet Take 1 tablet by mouth daily.   naproxen 375 MG tablet Commonly known as: NAPROSYN Take 1 tablet (375 mg total) by mouth 2 (two) times daily as needed for moderate pain.   omeprazole 40 MG capsule Commonly known as: PRILOSEC TAKE 1 CAPSULE BY MOUTH DAILY. FOR THREE WEEKS THEN AS NEEDED. TAKE 30 MIN PRIOR TO LARGE MEAL What changed: See the new instructions.   OXYGEN Inhale 3 L/min into the lungs continuous.   predniSONE 10 MG tablet Commonly known as: DELTASONE Take 4 tablets (40 mg) daily for 3 days, then, Take 3 tablets (30 mg) daily for 3 days, then, Take 2 tablets (20 mg) daily for 3 days, then, Take 1 tablets (10 mg) daily for 3 days, then stop   sertraline 100 MG tablet Commonly known as: ZOLOFT Take 1.5 tablets (150 mg total) by mouth daily.   thiamine 100 MG tablet Take 1 tablet (100 mg total) by mouth daily.   vitamin C with rose hips 500 MG tablet Take 500 mg by mouth daily.       Time coordinating discharge: 35 minutes  The results of significant diagnostics from this hospitalization (including imaging, microbiology, ancillary and laboratory) are listed below for reference.    Procedures and Diagnostic Studies:   DG Chest Port 1 View  Result Date: 12/10/2019 CLINICAL DATA:  59 year old female with shortness of breath EXAM: PORTABLE CHEST 1 VIEW COMPARISON:  Chest radiograph dated 10/16/2019 and chest CT dated 10/13/2019. FINDINGS: Background of emphysema. Bilateral upper lobe nodular densities as seen on the prior CT of 10/13/2019, possibly scarring. Follow-up with CT as prior recommendation. No new consolidative  changes. There is no pleural effusion or pneumothorax. The cardiac silhouette is within normal limits. There is prominence of the central pulmonary arteries suggestive of pulmonary hypertension. No acute osseous pathology. IMPRESSION: 1. No acute cardiopulmonary process. 2. Emphysema and bilateral upper lobe scarring. Follow-up as per recommendation of the prior CT. 3. Probable pulmonary hypertension. Electronically Signed   By: Anner Crete M.D.   On: 12/10/2019 20:20     Labs:   Basic Metabolic Panel: Recent Labs  Lab 12/10/19 2004 12/11/19 0500  NA 132* 137  K 3.6 4.3  CL 94* 96*  CO2 28 30  GLUCOSE 113* 176*  BUN <5* 8  CREATININE 0.52 0.66  CALCIUM 7.9* 8.6*   GFR Estimated Creatinine Clearance: 57.3 mL/min (by C-G  formula based on SCr of 0.66 mg/dL). Liver Function Tests: Recent Labs  Lab 12/11/19 0500  AST 24  ALT 12  ALKPHOS 79  BILITOT 0.4  PROT 6.9  ALBUMIN 4.0   No results for input(s): LIPASE, AMYLASE in the last 168 hours. No results for input(s): AMMONIA in the last 168 hours. Coagulation profile No results for input(s): INR, PROTIME in the last 168 hours.  CBC: Recent Labs  Lab 12/10/19 2004 12/11/19 0500  WBC 7.3 3.6*  NEUTROABS 3.8  --   HGB 11.7* 12.2  HCT 35.8* 38.6  MCV 87.5 87.3  PLT 188 203   Cardiac Enzymes: No results for input(s): CKTOTAL, CKMB, CKMBINDEX, TROPONINI in the last 168 hours. BNP: Invalid input(s): POCBNP CBG: No results for input(s): GLUCAP in the last 168 hours. D-Dimer No results for input(s): DDIMER in the last 72 hours. Hgb A1c No results for input(s): HGBA1C in the last 72 hours. Lipid Profile No results for input(s): CHOL, HDL, LDLCALC, TRIG, CHOLHDL, LDLDIRECT in the last 72 hours. Thyroid function studies No results for input(s): TSH, T4TOTAL, T3FREE, THYROIDAB in the last 72 hours.  Invalid input(s): FREET3 Anemia work up No results for input(s): VITAMINB12, FOLATE, FERRITIN, TIBC, IRON, RETICCTPCT  in the last 72 hours. Microbiology Recent Results (from the past 240 hour(s))  SARS Coronavirus 2 by RT PCR (hospital order, performed in [facility] hospital lab) Nasopharyngeal Nasopharyngeal Swab     Status: None   Collection Time: 12/10/19  8:05 PM   Specimen: Nasopharyngeal Swab  Result Value Ref Range Status   SARS Coronavirus 2 NEGATIVE NEGATIVE Final    Comment: (NOTE) SARS-CoV-2 target nucleic acids are NOT DETECTED.  The SARS-CoV-2 RNA is generally detectable in upper and lower respiratory specimens during the acute phase of infection. The lowest concentration of SARS-CoV-2 viral copies this assay can detect is 250 copies / mL. A negative result does not preclude SARS-CoV-2 infection and should not be used as the sole basis for treatment or other patient management decisions.  A negative result may occur with improper specimen collection / handling, submission of specimen other than nasopharyngeal swab, presence of viral mutation(s) within the areas targeted by this assay, and inadequate number of viral copies (<250 copies / mL). A negative result must be combined with clinical observations, patient history, and epidemiological information.  Fact Sheet for Patients:   StrictlyIdeas.no  Fact Sheet for Healthcare Providers: BankingDealers.co.za  This test is not yet approved or  cleared by the [place] FDA and has been authorized for detection and/or diagnosis of SARS-CoV-2 by FDA under an Emergency Use Authorization (EUA).  This EUA will remain in effect (meaning this test can be used) for the duration of the COVID-19 declaration under Section 564(b)(1) of the Act, 21 U.S.C. section 360bbb-3(b)(1), unless the authorization is terminated or revoked sooner.  Performed at [facility], [place] [address], [place], [place] [address]     Please note: You were cared for by a hospitalist during your  hospital stay. Once you are discharged, your primary care physician will handle any further medical issues. Please note that NO REFILLS for any discharge medications will be authorized once you are discharged, as it is imperative that you return to your primary care physician (or establish a relationship with a primary care physician if you do not have one) for your post hospital discharge needs so that they can reassess your need for medications and monitor your lab values.  Signed: Terrilee Croak  [facility] 12/17/2019, 11:29 AM

## 2019-12-17 NOTE — Progress Notes (Signed)
Pt continues to trigger MEWS yellow for elevated HR.  Pt has been ST since admission

## 2019-12-17 NOTE — Telephone Encounter (Signed)
Pt insurance is active and benefits verified through [place] $10, DED 0/0 met, out of pocket $3,900/$1,149.90 met, co-insurance 0%. no pre-authorization required. [place], [place] 12/17/2019@ 3:51pm, REF# [id number]

## 2019-12-17 NOTE — Telephone Encounter (Signed)
Transition Care Management Follow-up Telephone Call  Date of discharge and from where: 12/17/2019, [facility]  How have you been since you were released from the hospital? Patient states that she is feeling so much better.  Any questions or concerns? No   Items Reviewed:  Did the pt receive and understand the discharge instructions provided? Yes   Medications obtained and verified? Yes   Any new allergies since your discharge? No   Dietary orders reviewed? Yes  Do you have support at home? Yes   Functional Questionnaire: (I = Independent and D = Dependent) ADLs: I  Bathing/Dressing- I  Meal Prep- I  Eating- I  Maintaining continence- I  Transferring/Ambulation- I  Managing Meds- I  Follow up appointments reviewed:   PCP Hospital f/u appt confirmed? No  Provider has no availability on his schedule. Forwarding this note to the [place] to call patient and schedule follow up visit.   [facility] f/u appt confirmed? Yes  Scheduled to see pulmonary on 12/30/2019   Are transportation arrangements needed? No   If their condition worsens, is the pt aware to call PCP or go to the Emergency Dept.? Yes  Was the patient provided with contact information for the PCP's office or ED? Yes  Was to pt encouraged to call back with questions or concerns? Yes

## 2019-12-17 NOTE — Progress Notes (Signed)
NAME:  Christine Serrano, MRN:  [id number], DOB:  Jan 21, 1961, LOS: 7 ADMISSION DATE:  12/10/2019, CONSULTATION DATE:  12/15/2019 REFERRING MD:  Dr Bonner Puna, CHIEF COMPLAINT:  SOB   Brief History    59 y/o F with severe COPD, 4L O2 dependent admitted 6/25 after running out of O2 and developed respiratory distress.   History of present illness    58 y/o F, smoker (>100 pack years, began smoking at age 58) who presneted to [facility] on 6/25 with respiratory distress.    At baseline, she lives with room mates.  One room mate is her HCPOA.  She continues to smoke but has desire to quit.  She is a former heavy drinker but quit in 2017.  Will drink an occasional hard seltzer.  She is largely confined to a chair. She states she can no longer "do the things she enjoys like riding her hoarse".  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  She wishes she had a motorized wheelchair so she could be more active. One month prior to admit, she was treated with doxycycline for an AECOPD by her primary care MD.  She states she has at least one exacerbation per year requiring steroids and antibiotics.  She endorses difficulty with inhaling medications and meal related dyspnea / poor intake.   The patient was reportedly out fishing with a friend when her O2 tank ran out.  She initially though she would be ok as she was sitting on a bench but had to walk to the car and became distressed.  EMS was activated and found her tripoding, with saturations of 65-70%, and unable to speak in full sentences.  She states she has not had her COVID vaccine. COVID testing negative.  Initial CXR 6/25 negative for acute infiltrates.  She was noted to have wheezing bilaterally on presentation.  She was admitted per [facility] for suspected acute exacerbation of COPD. She has been treated with IV steroids, nebulized bronchodilators and doxycycline.  The patient was slow to resolve, PCCM consulted for evaluation.   Past  Medical History  Depression  Chronic Constipation  ETOH Abuse  Tobacco Abuse  Chronic O2 Dependent Respiratory Failure - 4L baseline  Severe COPD - Spirometry 01/2018 with FVC 1.42 (48%), FEV1 0.76 (31%), FEV1/FVC 53% RUL PNA - 2018  Significant Hospital Events   6/25 Admit  6/30 PCCM consult 7/02 improving   Consults:  6/25 admitted 6/30 critical care  Procedures:    Significant Diagnostic Tests:  Low Dose CT Screening 12/10/19 >> emphysema, bilateral upper lobe nodular densities, possible scarring (seen on CT 09/2019), intralobular septal thickening, suspected pulmonary hypertension  Micro Data:  COVID 6/25 >> negative   Antimicrobials:   Doxycycline 6/26 >> 6/30   Interim history/subjective:  Remains on baseline O2 States she feels better but not back to baseline  Objective   Blood pressure 127/79, pulse 100, temperature 98.6 F (37 C), temperature source Oral, resp. rate 18, height 5' 4.5" (1.638 m), weight 48.5 kg, SpO2 100 %.        Intake/Output Summary (Last 24 hours) at 12/17/2019 1054 Last data filed at 12/17/2019 0500 Gross per 24 hour  Intake 720 ml  Output --  Net 720 ml   Filed Weights   12/11/19 0648  Weight: 48.5 kg    Examination: General: chronically ill appearing adult female lying in bed in NAD  HEENT: MM pink/moist, no jvd Neuro: AAOx4, speech  clear, MAE CV: s1s2 RRR, no m/r/g PULM: non-labored, faint exp wheeze posterior GI: soft, bsx4 active  Extremities: warm/dry, no edema  Skin: no rashes or lesions  Resolved Hospital Problem list     Assessment & Plan:   Severe Obstructive Lung Disease Emphysema Acute on Chronic Hypoxic Respiratory Failure Tobacco Abuse  -discharge medication plan > pulmicort 0.5 mg BID + brovana 15 mcg BID nebulized.  She should take the paper prescription to the DME company that supplies her oxygen and they will fill her nebs (this comes out of a different portion of medicare) -continue home O2   -consider home palliative care referral  -discussed back up plan for over the weekend in the event she can no get her nebs to continue to take symbicort + spiriva and duoneb Q6 until she can get her new nebs filled.  She understands not to take Brovana/Pulmiort with symbicort/spiriva -smoking cessation education provided, encouraged  -may need daliresp vs azithro at home  -consider pulmonary rehab  -hospital follow up appt made for Dr. Patsey Berthold in [facility] > July 15th at [place], MSN, NP-C [facility] 12/17/2019, 11:02 AM   Please see [email or web address] for pager details.

## 2019-12-17 NOTE — Telephone Encounter (Addendum)
Refilled.  If it's reasonable to refill, please queue up for me next time.  wellbutrin and robaxin sent to [facility]. Recommend meclizine through local pharmacy (sent)

## 2019-12-17 NOTE — Telephone Encounter (Signed)
Received refill request from [facility] order for the following:  Bupropion last rx:  11/10/19, #90/1 Methocarbamol last rx:  11/10/19, #30/0 Meclizine last rx:  09/12/19, #30/0 Last OV:  11/10/19, L shoulder pain Next OV:  none

## 2019-12-17 NOTE — Discharge Instructions (Signed)

## 2019-12-17 NOTE — Telephone Encounter (Signed)
I do have open 30 min slots it just doesn't show up as available due to changes we've made to my schedule.  What's best way to manage this going forward?

## 2019-12-17 NOTE — Addendum Note (Signed)
Addended by: Ria Bush on: 12/17/2019 07:22 PM   Modules accepted: Orders

## 2019-12-21 NOTE — Telephone Encounter (Addendum)
Noted.  Lvm asking pt to call back.  Need to notify her refills have been sent to [facility] mail order but meclizine was sent to [facility].

## 2019-12-23 ENCOUNTER — Other Ambulatory Visit: Payer: Self-pay

## 2019-12-23 ENCOUNTER — Other Ambulatory Visit: Payer: Self-pay | Admitting: Adult Health Nurse Practitioner

## 2019-12-23 ENCOUNTER — Telehealth (HOSPITAL_COMMUNITY): Payer: Self-pay | Admitting: *Deleted

## 2019-12-23 NOTE — Telephone Encounter (Signed)
Received referral from Dr. Pamala Duffel hospitalist at [facility] for this pt to participate in pulmonary rehab. Pt lives in [place].  Noted in pt history that she completed 11 sessions last November at [facility] rehab program.  Called and spoke to pt regarding preference of location.  Pt stated that right now "I am not able to do any exercising.  If someone could come to my home that would be better."  Pt notes reveal that she has been approved for powered wheelchair. Pt is receiving palliative care.    Advised pt to talk with her pulmonary provider at the next follow up on 7/15 regarding the possibility for home PT for deconditioning. Cherre Huger, BSN Cardiac and Training and development officer

## 2019-12-27 NOTE — Telephone Encounter (Signed)
LVM

## 2019-12-30 ENCOUNTER — Inpatient Hospital Stay: Payer: Medicare HMO | Admitting: Pulmonary Disease

## 2019-12-31 ENCOUNTER — Ambulatory Visit: Payer: Medicare HMO | Admitting: Family Medicine

## 2020-01-01 ENCOUNTER — Telehealth: Payer: Self-pay | Admitting: Family Medicine

## 2020-01-01 NOTE — Telephone Encounter (Signed)
Patient missed hosp f/u appointment on Friday.  Please call for f/u, ensure doing ok, offer rescheduling appt.

## 2020-01-03 NOTE — Telephone Encounter (Signed)
Lvm asking pt to call back.  Need to relay Dr. Synthia Innocent message and r/s hospital f/u.

## 2020-01-04 NOTE — Telephone Encounter (Signed)
Noted  

## 2020-01-04 NOTE — Telephone Encounter (Signed)
Pt has apt with PCP on 7/23. She will have lab for prolia at that apt. Scheduled prolia inj on 8/18.

## 2020-01-04 NOTE — Telephone Encounter (Signed)
Lvm asking pt to call back.  Need to relay Dr. Synthia Innocent message and r/s hospital f/u.

## 2020-01-06 ENCOUNTER — Other Ambulatory Visit: Payer: Self-pay | Admitting: Adult Health Nurse Practitioner

## 2020-01-06 ENCOUNTER — Other Ambulatory Visit: Payer: Self-pay

## 2020-01-06 DIAGNOSIS — Z515 Encounter for palliative care: Secondary | ICD-10-CM

## 2020-01-06 DIAGNOSIS — J449 Chronic obstructive pulmonary disease, unspecified: Secondary | ICD-10-CM | POA: Diagnosis not present

## 2020-01-06 DIAGNOSIS — Z9981 Dependence on supplemental oxygen: Secondary | ICD-10-CM

## 2020-01-06 DIAGNOSIS — J9611 Chronic respiratory failure with hypoxia: Secondary | ICD-10-CM

## 2020-01-06 NOTE — Progress Notes (Signed)
[facility] Consult Note Telephone: [phone]  Fax: [phone]  PATIENT NAME: Christine Serrano DOB: 08-Jun-1961 MRN: [id number]  PRIMARY CARE PROVIDER:   Ria Bush, MD  REFERRING PROVIDER:  Ria Bush, MD [place],  [place] [address]  RESPONSIBLE PARTY: Self [phone] [name] SO/HCPOA [phone]    RECOMMENDATIONS and PLAN:  1.  Advanced care planning.  Patient is DNR  2. Severe COPD. Patient was hospitalized 6/25 through 12/17/2019 for COPD exacerbation. O2 dependent at 4 L and will increase to 5 L with activity. She doesn't sleep with head of the bed elevated and with two pillows, this is baseline. She is independent of ADLs but has to take frequent rest breaks due to dyspnea. Is working on getting a scooter so that she can go places outside of the home. When she left the hospital she was given prescriptions for nebulizer treatments. These are expensive and they suggested that she get them through [facility] which is a medical supply company. I was unsure if she could get the medicine there as they deal mostly with medical equipment. We'll reach out to our office to see if this is accurate information. Patient does have appointment with PCP tomorrow and have encouraged her to inquire at his office to see if this is something that she would be able to do to afford her medications.  3. Depression. Patient is on Wellbutrin 150 mg daily and Remeron 15 mg nightly. Patient states that this is really helped and she is not feeling depressed. Patient is in better spirits today than in previous visits. Her appetite has increased and her weight today is 116.4 pounds. This is up from 107 pounds. Continue with current depression medications and continue to monitor for effectiveness  4. Blood pressure. Patient states that when she was in the hospital her blood pressure was low today sitting her blood pressure is  82/58 though patient is asymptomatic. Denies headaches, dizziness, lightheadedness. Her heart rate is 118. Discussed that this could possibly be some dehydration. Have encouraged drinking water as she does drink a lot of tea. Did discuss that the caffeine in the tea asked like a diuretic and could be causing some dehydration. Also discussed that she can supplement with Pedialyte or Gatorade, but encouraged drinking more water. Advised her to check her blood pressure daily at the same time each day. Will call back in 1 week to see how her blood pressure has been doing with increased water intake. She has appointment with PCP tomorrow and encouraged to bring this up with him as well.  Palliative will continue to monitor for symptom management/decline and make recommendations as needed. Will call in 1 week to check on her blood pressures and make appointment at that time. Encouraged to call with any questions or concerns  I spent 90 minutes providing this consultation,  from 2:00 to 3:30 including time spent with patient/family, chart review, chart review, documentation. More than 50% of the time in this consultation was spent coordinating communication.   HISTORY OF PRESENT ILLNESS:  Christine Serrano is a 59 y.o. year old female with multiple medical problems including severe COPD, HLD, depression. Palliative Care was asked to help address goals of care.   CODE STATUS: DNR  PPS: 40% HOSPICE ELIGIBILITY/DIAGNOSIS: TBD  PHYSICAL EXAM:  BP sitting 82/58  HR 118  O2 99% on 4L General: NAD, frail appearing, thin Cardiovascular:tachycardic;regular rate and rhythm Pulmonary:lung sounds diminished with  poor air movement with wheezing heard throughout; normal respiratory effort Abdomen: soft, nontender, + bowel sounds GU: no suprapubic tenderness Extremities: no edema, no joint deformities Skin: no rasheson exposed skin Neurological: Weakness but otherwise nonfocal; is having more forgetfulnes  PAST  MEDICAL HISTORY:  Past Medical History:  Diagnosis Date  . Cervical cancer ([facility])   . Chronic idiopathic constipation   . Community acquired pneumonia of right upper lobe of lung 12/10/2016  . COPD (chronic obstructive pulmonary disease) ([facility])   . Depression   . Endometriosis   . HLD (hyperlipidemia)   . Smoker     SOCIAL HX:  Social History   Tobacco Use  . Smoking status: Current Every Day Smoker    Packs/day: 3.00    Years: 50.00    Pack years: 150.00    Types: Cigarettes  . Smokeless tobacco: Never Used  . Tobacco comment: 10/18/19 pt somkes 2 packs per day [facility]  Substance Use Topics  . Alcohol use: Yes    Comment: occasional    ALLERGIES:  Allergies  Allergen Reactions  . Terbinafine And Related Diarrhea    Diarrheal illness while on terbinafine ?related to med  . Imitrex [Sumatriptan] Other (See Comments)    Worsens migraine      PERTINENT MEDICATIONS:  Outpatient Encounter Medications as of 01/06/2020  Medication Sig  . albuterol (PROVENTIL) (2.5 MG/3ML) 0.083% nebulizer solution Take 3 mLs (2.5 mg total) by nebulization every 2 (two) hours as needed for wheezing.  Marland Kitchen arformoterol (BROVANA) 15 MCG/2ML NEBU Take 2 mLs (15 mcg total) by nebulization 2 (two) times daily.  . Ascorbic Acid (VITAMIN C WITH ROSE HIPS) 500 MG tablet Take 500 mg by mouth daily.  Marland Kitchen aspirin EC 81 MG tablet Take 81 mg by mouth daily.  . budesonide (PULMICORT) 0.5 MG/2ML nebulizer solution Take 2 mLs (0.5 mg total) by nebulization 2 (two) times daily.  Marland Kitchen buPROPion (WELLBUTRIN XL) 150 MG 24 hr tablet Take 1 tablet (150 mg total) by mouth daily.  . Calcium Ascorbate 500 MG TABS Take 1 tablet (500 mg total) by mouth daily. (Patient not taking: Reported on 12/10/2019)  . cholecalciferol (VITAMIN D3) 25 MCG (1000 UNIT) tablet Take 1,000 Units by mouth daily.  . fluticasone (FLONASE) 50 MCG/ACT nasal spray Place 2 sprays into both nostrils daily. (Patient taking differently: Place 2 sprays into both  nostrils daily as needed for allergies or rhinitis. )  . folic acid (FOLVITE) 1 MG tablet Take 1 tablet (1 mg total) by mouth daily.  Marland Kitchen gabapentin (NEURONTIN) 100 MG capsule Take 1 capsule (100 mg total) by mouth 2 (two) times daily.  Marland Kitchen guaiFENesin (MUCINEX) 600 MG 12 hr tablet Take 1 tablet (600 mg total) by mouth 2 (two) times daily.  . hydrOXYzine (ATARAX/VISTARIL) 25 MG tablet TAKE 1 TABLET (25 MG TOTAL) BY MOUTH 2 (TWO) TIMES DAILY AS NEEDED FOR ANXIETY. (Patient taking differently: Take 25 mg by mouth 2 (two) times daily as needed for anxiety. )  . LINZESS 290 MCG CAPS capsule Take 1 capsule (290 mcg total) by mouth daily as needed (constipation). (Patient taking differently: Take 290 mcg by mouth daily before breakfast. )  . meclizine (ANTIVERT) 25 MG tablet Take 1 tablet (25 mg total) by mouth 3 (three) times daily as needed for dizziness.  . methocarbamol (ROBAXIN) 500 MG tablet Take 1 tablet (500 mg total) by mouth 3 (three) times daily as needed for muscle spasms (sedation precautions).  . mirtazapine (REMERON) 15 MG tablet TAKE  1 TABLET BY MOUTH EVERYDAY AT BEDTIME (Patient taking differently: Take 15 mg by mouth at bedtime. )  . Multiple Vitamin (MULTIVITAMIN WITH MINERALS) TABS tablet Take 1 tablet by mouth daily.  . naproxen (NAPROSYN) 375 MG tablet Take 1 tablet (375 mg total) by mouth 2 (two) times daily as needed for moderate pain.  Marland Kitchen omeprazole (PRILOSEC) 40 MG capsule TAKE 1 CAPSULE BY MOUTH DAILY. FOR THREE WEEKS THEN AS NEEDED. TAKE 30 MIN PRIOR TO LARGE MEAL (Patient taking differently: Take 40 mg by mouth daily. )  . OXYGEN Inhale 3 L/min into the lungs continuous.  . predniSONE (DELTASONE) 10 MG tablet Take 4 tablets (40 mg) daily for 3 days, then, Take 3 tablets (30 mg) daily for 3 days, then, Take 2 tablets (20 mg) daily for 3 days, then, Take 1 tablets (10 mg) daily for 3 days, then stop  . sertraline (ZOLOFT) 100 MG tablet Take 1.5 tablets (150 mg total) by mouth daily.    Marland Kitchen thiamine 100 MG tablet Take 1 tablet (100 mg total) by mouth daily.   No facility-administered encounter medications on file as of 01/06/2020.      Hakiem Malizia Jenetta Downer, NP

## 2020-01-06 NOTE — Telephone Encounter (Signed)
Pt scheduled 01/07/20 at 12:00.

## 2020-01-07 ENCOUNTER — Encounter: Payer: Self-pay | Admitting: Family Medicine

## 2020-01-07 ENCOUNTER — Ambulatory Visit (INDEPENDENT_AMBULATORY_CARE_PROVIDER_SITE_OTHER): Payer: Medicare HMO | Admitting: Family Medicine

## 2020-01-07 ENCOUNTER — Other Ambulatory Visit: Payer: Self-pay

## 2020-01-07 VITALS — BP 100/76 | HR 111 | Temp 97.8°F | Ht 64.5 in | Wt 121.1 lb

## 2020-01-07 DIAGNOSIS — F419 Anxiety disorder, unspecified: Secondary | ICD-10-CM

## 2020-01-07 DIAGNOSIS — J9611 Chronic respiratory failure with hypoxia: Secondary | ICD-10-CM | POA: Diagnosis not present

## 2020-01-07 DIAGNOSIS — J449 Chronic obstructive pulmonary disease, unspecified: Secondary | ICD-10-CM | POA: Diagnosis not present

## 2020-01-07 DIAGNOSIS — Z720 Tobacco use: Secondary | ICD-10-CM | POA: Diagnosis not present

## 2020-01-07 DIAGNOSIS — J441 Chronic obstructive pulmonary disease with (acute) exacerbation: Secondary | ICD-10-CM

## 2020-01-07 DIAGNOSIS — Z7409 Other reduced mobility: Secondary | ICD-10-CM | POA: Diagnosis not present

## 2020-01-07 DIAGNOSIS — R000 Tachycardia, unspecified: Secondary | ICD-10-CM

## 2020-01-07 DIAGNOSIS — M810 Age-related osteoporosis without current pathological fracture: Secondary | ICD-10-CM

## 2020-01-07 DIAGNOSIS — F331 Major depressive disorder, recurrent, moderate: Secondary | ICD-10-CM | POA: Diagnosis not present

## 2020-01-07 DIAGNOSIS — Z515 Encounter for palliative care: Secondary | ICD-10-CM

## 2020-01-07 DIAGNOSIS — Z9981 Dependence on supplemental oxygen: Secondary | ICD-10-CM

## 2020-01-07 LAB — BASIC METABOLIC PANEL
BUN: 11 mg/dL (ref 6–23)
CO2: 37 mEq/L — ABNORMAL HIGH (ref 19–32)
Calcium: 10.4 mg/dL (ref 8.4–10.5)
Chloride: 92 mEq/L — ABNORMAL LOW (ref 96–112)
Creatinine, Ser: 0.85 mg/dL (ref 0.40–1.20)
GFR: 67.99 mL/min (ref >60.00)
Glucose, Bld: 84 mg/dL (ref 70–99)
Potassium: 5.1 mEq/L (ref 3.5–5.1)
Sodium: 135 mEq/L (ref 135–145)

## 2020-01-07 MED ORDER — BUDESONIDE-FORMOTEROL FUMARATE 160-4.5 MCG/ACT IN AERO
2.0000 | INHALATION_SPRAY | Freq: Two times a day (BID) | RESPIRATORY_TRACT | 0 refills | Status: DC
Start: 2020-01-07 — End: 2020-06-06

## 2020-01-07 MED ORDER — PREDNISONE 20 MG PO TABS: ORAL_TABLET | ORAL | 0 refills | Status: DC

## 2020-01-07 MED ORDER — PREDNISONE 20 MG PO TABS
ORAL_TABLET | ORAL | 0 refills | Status: DC
Start: 2020-01-07 — End: 2020-01-07

## 2020-01-07 MED ORDER — SPIRIVA HANDIHALER 18 MCG IN CAPS
18.0000 ug | ORAL_CAPSULE | Freq: Every day | RESPIRATORY_TRACT | 0 refills | Status: DC
Start: 2020-01-07 — End: 2020-02-23

## 2020-01-07 NOTE — Patient Instructions (Addendum)
Call to schedule follow up with pulmonology  Start prednisone taper sent to [facility] today  Restart Symbicort and Spiriva.  Can use Albuterol Neb 3-4 times a day as needed.

## 2020-01-07 NOTE — Assessment & Plan Note (Signed)
Pending power scooter.

## 2020-01-07 NOTE — Assessment & Plan Note (Signed)
Anticipate combination of anxiety and increased albuterol use.

## 2020-01-07 NOTE — Assessment & Plan Note (Signed)
BMP today in preparation for prolia next month.

## 2020-01-07 NOTE — Assessment & Plan Note (Addendum)
Appreciate palliative care assistance.  

## 2020-01-07 NOTE — Assessment & Plan Note (Addendum)
Ongoing struggle given progressive physical limitations from severe COPD. She is currently on sertraline 150mg , wellbutrin XL 150mg  and remeron 15mg  nightly. wellbutrin may be worsening anxiety - consider change but would want to re-evaluate pending improvement in breathing status.

## 2020-01-07 NOTE — Assessment & Plan Note (Signed)
Rx prednisone taper today

## 2020-01-07 NOTE — Assessment & Plan Note (Addendum)
Very severe oxygen dependent in continued smoker.  She has not been taking much in the form of controller medication at all - nebulizers were too expensive, never restarted spiriva/symbicort. I have refilled symbicort and spiriva and reviewed importance of controller medication in COPD treatment. Will Rx prednisone taper given she meets criteria for COPD exacerbation at this time.  I did ask her to take breathing treatment (alb/atrovent) when she gets home today, reviewed ER precautions. I also asked her to call pulm for f/u appointment.

## 2020-01-07 NOTE — Progress Notes (Signed)
This visit was conducted in person.  BP 100/76 (BP Location: Left Arm, Patient Position: Sitting, Cuff Size: Normal)   Pulse (!) 111   Temp 97.8 F (36.6 C) (Temporal)   Ht 5' 4.5" (1.638 m)   Wt 121 lb 1 oz (54.9 kg)   SpO2 94% Comment: 4 L  BMI 20.46 kg/m   BP Readings from Last 3 Encounters:  01/07/20 100/76  12/17/19 127/79  11/10/19 118/70    CC: hosp f/u visit  Subjective:    Patient ID: Christine Serrano, female    DOB: 1960-09-22, 59 y.o.   MRN: [id number]  HPI: Christine Serrano is a 59 y.o. female presenting on 01/07/2020 for Hospitalization Follow-up (Requests labs for Prolia shot.  Pt accompanied by sister, Christine Serrano- temp 97.7.  )   Missed TCM hosp f/u visit last week.  Palliative care involved, seen yesterday. Note reviewed.  Off prednisone.  Due for pulm f/u Christine Serrano).  Back to smoking >1 ppd.   2 wk h/o increasing productive cough, increased sputum production, increased wheezing and dyspnea (correlating to restarting smoking).  No chest pain. No fever.   Some left arm paresthesias with chronic neck pain.   Severe COPD on 4L supplemental O2 - currently ONLY using albuterol inhaler 2 puffs twice daily and atrovent inhaler 2 puffs twice daily as well as alb/atrovent nebs regularly throughout the day. She has not been taking spiriva, symbicort, brovana, or pulmicort.   _______________________________________________________ Admit date: 12/10/2019 Discharge date: 12/17/2019 TCM hosp f/u phone call completed on 12/17/2019  Admitted From: Home Discharge disposition: Home   Code Status: Partial Code  Diet Recommendation: Regular diet  Recommendations for outpatient follow up: 1. Follow-up with PCP as an outpatient 2. Follow-up with palliative care, pulmonary rehab  Discharge diagnosis: Principal Problem:   Acute respiratory failure ([facility]) Active Problems:   COPD exacerbation ([facility])   Acute respiratory failure with hypoxemia  (H[facility])  Mobility:Independent  Nutritional status: Regular diet. Body mass index is 18.08 kg/m.   Code Status:Code Status: Partial Codeper chart DVT prophylaxis: enoxaparin (LOVENOX) injection 30 mg Start: 12/12/19 1700  Antimicrobials:None currently Fluid:None currently  Consultants:Pulmonology Family Communication:None at bedside     Relevant past medical, surgical, family and social history reviewed and updated as indicated. Interim medical history since our last visit reviewed. Allergies and medications reviewed and updated. Outpatient Medications Prior to Visit  Medication Sig Dispense Refill  . albuterol (PROVENTIL) (2.5 MG/3ML) 0.083% nebulizer solution Take 3 mLs (2.5 mg total) by nebulization every 2 (two) hours as needed for wheezing. 75 mL 12  . arformoterol (BROVANA) 15 MCG/2ML NEBU Take 2 mLs (15 mcg total) by nebulization 2 (two) times daily. 120 mL 0  . Ascorbic Acid (VITAMIN C WITH ROSE HIPS) 500 MG tablet Take 500 mg by mouth daily.    Marland Kitchen aspirin EC 81 MG tablet Take 81 mg by mouth daily.    . budesonide (PULMICORT) 0.5 MG/2ML nebulizer solution Take 2 mLs (0.5 mg total) by nebulization 2 (two) times daily. 120 mL 0  . buPROPion (WELLBUTRIN XL) 150 MG 24 hr tablet Take 1 tablet (150 mg total) by mouth daily. 90 tablet 1  . Calcium Ascorbate 500 MG TABS Take 1 tablet (500 mg total) by mouth daily.  0  . cholecalciferol (VITAMIN D3) 25 MCG (1000 UNIT) tablet Take 1,000 Units by mouth daily.    . fluticasone (FLONASE) 50 MCG/ACT nasal spray Place 2 sprays into both nostrils daily. (Patient taking differently: Place 2 sprays  into both nostrils daily as needed for allergies or rhinitis. ) 48 g 3  . folic acid (FOLVITE) 1 MG tablet Take 1 tablet (1 mg total) by mouth daily. 90 tablet 3  . gabapentin (NEURONTIN) 100 MG capsule Take 1 capsule (100 mg total) by mouth 2 (two) times daily. 180 capsule 1  . guaiFENesin (MUCINEX) 600 MG 12 hr tablet Take 1 tablet  (600 mg total) by mouth 2 (two) times daily. 60 tablet 0  . hydrOXYzine (ATARAX/VISTARIL) 25 MG tablet TAKE 1 TABLET (25 MG TOTAL) BY MOUTH 2 (TWO) TIMES DAILY AS NEEDED FOR ANXIETY. (Patient taking differently: Take 25 mg by mouth 2 (two) times daily as needed for anxiety. ) 180 tablet 0  . LINZESS 290 MCG CAPS capsule Take 1 capsule (290 mcg total) by mouth daily as needed (constipation). (Patient taking differently: Take 290 mcg by mouth daily before breakfast. ) 30 capsule 3  . meclizine (ANTIVERT) 25 MG tablet Take 1 tablet (25 mg total) by mouth 3 (three) times daily as needed for dizziness. 30 tablet 0  . methocarbamol (ROBAXIN) 500 MG tablet Take 1 tablet (500 mg total) by mouth 3 (three) times daily as needed for muscle spasms (sedation precautions). 90 tablet 1  . mirtazapine (REMERON) 15 MG tablet TAKE 1 TABLET BY MOUTH EVERYDAY AT BEDTIME (Patient taking differently: Take 15 mg by mouth at bedtime. ) 90 tablet 3  . Multiple Vitamin (MULTIVITAMIN WITH MINERALS) TABS tablet Take 1 tablet by mouth daily.    . naproxen (NAPROSYN) 375 MG tablet Take 1 tablet (375 mg total) by mouth 2 (two) times daily as needed for moderate pain. 40 tablet 1  . omeprazole (PRILOSEC) 40 MG capsule TAKE 1 CAPSULE BY MOUTH DAILY. FOR THREE WEEKS THEN AS NEEDED. TAKE 30 MIN PRIOR TO LARGE MEAL (Patient taking differently: Take 40 mg by mouth daily. ) 90 capsule 1  . OXYGEN Inhale 3 L/min into the lungs continuous.    . sertraline (ZOLOFT) 100 MG tablet Take 1.5 tablets (150 mg total) by mouth daily. 135 tablet 3  . thiamine 100 MG tablet Take 1 tablet (100 mg total) by mouth daily. 30 tablet 0  . predniSONE (DELTASONE) 10 MG tablet Take 4 tablets (40 mg) daily for 3 days, then, Take 3 tablets (30 mg) daily for 3 days, then, Take 2 tablets (20 mg) daily for 3 days, then, Take 1 tablets (10 mg) daily for 3 days, then stop 30 tablet 0   No facility-administered medications prior to visit.     Per HPI unless  specifically indicated in ROS section below Review of Systems Objective:  BP 100/76 (BP Location: Left Arm, Patient Position: Sitting, Cuff Size: Normal)   Pulse (!) 111   Temp 97.8 F (36.6 C) (Temporal)   Ht 5' 4.5" (1.638 m)   Wt 121 lb 1 oz (54.9 kg)   SpO2 94% Comment: 4 L  BMI 20.46 kg/m   Wt Readings from Last 3 Encounters:  01/07/20 121 lb 1 oz (54.9 kg)  12/11/19 107 lb (48.5 kg)  11/10/19 107 lb 8 oz (48.8 kg)      Physical Exam Vitals and nursing note reviewed.  Constitutional:      Appearance: Normal appearance. She is not ill-appearing.     Comments: Continuous supplemental oxygen at 4L by nasal cannula  Eyes:     Extraocular Movements: Extraocular movements intact.     Pupils: Pupils are equal, round, and reactive to light.  Cardiovascular:  Rate and Rhythm: Regular rhythm. Tachycardia present.     Pulses: Normal pulses.     Heart sounds: Normal heart sounds. No murmur heard.   Pulmonary:     Effort: No respiratory distress.     Breath sounds: Wheezing (diffuse insp/exp) present. No rhonchi or rales.     Comments: Increased work of breathing Musculoskeletal:        General: Normal range of motion.     Right lower leg: No edema.     Left lower leg: No edema.  Skin:    General: Skin is warm and dry.     Findings: No rash.  Neurological:     Mental Status: She is alert.  Psychiatric:        Mood and Affect: Mood is anxious.       Assessment & Plan:  This visit occurred during the SARS-CoV-2 public health emergency.  Safety protocols were in place, including screening questions prior to the visit, additional usage of staff PPE, and extensive cleaning of exam room while observing appropriate contact time as indicated for disinfecting solutions.   Problem List Items Addressed This Visit    Tobacco abuse    Continues smoking. Encouraged cessation. Contemplative.       Tachycardia    Anticipate combination of anxiety and increased albuterol use.        Palliative care status    Appreciate palliative care assistance.      Osteoporosis    BMP today in preparation for prolia next month.       Relevant Orders   Basic metabolic panel   Moderate recurrent major depression ([facility])    Ongoing struggle given progressive physical limitations from severe COPD. She is currently on sertraline 150mg , wellbutrin XL 150mg  and remeron 15mg  nightly. wellbutrin may be worsening anxiety - consider change but would want to re-evaluate pending improvement in breathing status.       Impaired mobility    Pending power scooter.      COPD, very severe ([facility]) - Primary    Very severe oxygen dependent in continued smoker.  She has not been taking much in the form of controller medication at all - nebulizers were too expensive, never restarted spiriva/symbicort. I have refilled symbicort and spiriva and reviewed importance of controller medication in COPD treatment. Will Rx prednisone taper given she meets criteria for COPD exacerbation at this time.  I did ask her to take breathing treatment (alb/atrovent) when she gets home today, reviewed ER precautions. I also asked her to call pulm for f/u appointment.       Relevant Medications   budesonide-formoterol (SYMBICORT) 160-4.5 MCG/ACT inhaler   tiotropium (SPIRIVA HANDIHALER) 18 MCG inhalation capsule   predniSONE (DELTASONE) 20 MG tablet   COPD exacerbation ([facility])    Rx prednisone taper today      Relevant Medications   budesonide-formoterol (SYMBICORT) 160-4.5 MCG/ACT inhaler   tiotropium (SPIRIVA HANDIHALER) 18 MCG inhalation capsule   predniSONE (DELTASONE) 20 MG tablet   Chronic respiratory failure with hypoxia, on home O2 therapy ([facility])   Anxiety       Meds ordered this encounter  Medications  . budesonide-formoterol (SYMBICORT) 160-4.5 MCG/ACT inhaler    Sig: Inhale 2 puffs into the lungs 2 (two) times daily.    Dispense:  3 Inhaler    Refill:  0  . tiotropium (SPIRIVA HANDIHALER) 18 MCG  inhalation capsule    Sig: Place 1 capsule (18 mcg total) into inhaler and inhale daily.  Dispense:  90 capsule    Refill:  0  . DISCONTD: predniSONE (DELTASONE) 20 MG tablet    Sig: Take two tablets daily for 3 days followed by one tablet daily for 4 days    Dispense:  10 tablet    Refill:  0  . predniSONE (DELTASONE) 20 MG tablet    Sig: Take two tablets daily for 3 days followed by one tablet daily for 4 days    Dispense:  10 tablet    Refill:  0   Orders Placed This Encounter  Procedures  . Basic metabolic panel    Patient Instructions  Call to schedule follow up with pulmonology  Start prednisone taper sent to [facility] today  Restart Symbicort and Spiriva.  Can use Albuterol Neb 3-4 times a day as needed.    Follow up plan: Return in about 4 weeks (around 02/04/2020) for follow up visit.  Ria Bush, MD

## 2020-01-07 NOTE — Assessment & Plan Note (Signed)
Continues smoking. Encouraged cessation. Contemplative.

## 2020-01-08 ENCOUNTER — Emergency Department (HOSPITAL_COMMUNITY): Payer: Medicare HMO

## 2020-01-08 ENCOUNTER — Encounter (HOSPITAL_COMMUNITY): Payer: Self-pay

## 2020-01-08 ENCOUNTER — Inpatient Hospital Stay (HOSPITAL_COMMUNITY)
Admission: EM | Admit: 2020-01-08 | Discharge: 2020-01-11 | DRG: 191 | Disposition: A | Discharge status: Home / Self Care | Payer: Medicare HMO | Attending: Family Medicine | Admitting: Family Medicine

## 2020-01-08 ENCOUNTER — Other Ambulatory Visit: Payer: Self-pay

## 2020-01-08 DIAGNOSIS — F419 Anxiety disorder, unspecified: Secondary | ICD-10-CM

## 2020-01-08 DIAGNOSIS — E785 Hyperlipidemia, unspecified: Secondary | ICD-10-CM | POA: Diagnosis present

## 2020-01-08 DIAGNOSIS — I10 Essential (primary) hypertension: Secondary | ICD-10-CM | POA: Diagnosis not present

## 2020-01-08 DIAGNOSIS — Z720 Tobacco use: Secondary | ICD-10-CM | POA: Diagnosis not present

## 2020-01-08 DIAGNOSIS — F410 Panic disorder [episodic paroxysmal anxiety] without agoraphobia: Secondary | ICD-10-CM | POA: Diagnosis present

## 2020-01-08 DIAGNOSIS — Z825 Family history of asthma and other chronic lower respiratory diseases: Secondary | ICD-10-CM

## 2020-01-08 DIAGNOSIS — Z79899 Other long term (current) drug therapy: Secondary | ICD-10-CM

## 2020-01-08 DIAGNOSIS — Z20822 Contact with and (suspected) exposure to covid-19: Secondary | ICD-10-CM | POA: Diagnosis present

## 2020-01-08 DIAGNOSIS — F332 Major depressive disorder, recurrent severe without psychotic features: Secondary | ICD-10-CM | POA: Diagnosis present

## 2020-01-08 DIAGNOSIS — R062 Wheezing: Secondary | ICD-10-CM | POA: Diagnosis not present

## 2020-01-08 DIAGNOSIS — Z888 Allergy status to other drugs, medicaments and biological substances status: Secondary | ICD-10-CM | POA: Diagnosis not present

## 2020-01-08 DIAGNOSIS — F1011 Alcohol abuse, in remission: Secondary | ICD-10-CM

## 2020-01-08 DIAGNOSIS — J449 Chronic obstructive pulmonary disease, unspecified: Secondary | ICD-10-CM | POA: Diagnosis present

## 2020-01-08 DIAGNOSIS — N809 Endometriosis, unspecified: Secondary | ICD-10-CM | POA: Diagnosis present

## 2020-01-08 DIAGNOSIS — Z90721 Acquired absence of ovaries, unilateral: Secondary | ICD-10-CM

## 2020-01-08 DIAGNOSIS — Z8541 Personal history of malignant neoplasm of cervix uteri: Secondary | ICD-10-CM | POA: Diagnosis not present

## 2020-01-08 DIAGNOSIS — R55 Syncope and collapse: Secondary | ICD-10-CM | POA: Diagnosis not present

## 2020-01-08 DIAGNOSIS — K5904 Chronic idiopathic constipation: Secondary | ICD-10-CM | POA: Diagnosis present

## 2020-01-08 DIAGNOSIS — J9611 Chronic respiratory failure with hypoxia: Secondary | ICD-10-CM | POA: Diagnosis present

## 2020-01-08 DIAGNOSIS — Z9981 Dependence on supplemental oxygen: Secondary | ICD-10-CM

## 2020-01-08 DIAGNOSIS — J441 Chronic obstructive pulmonary disease with (acute) exacerbation: Principal | ICD-10-CM | POA: Diagnosis present

## 2020-01-08 DIAGNOSIS — F331 Major depressive disorder, recurrent, moderate: Secondary | ICD-10-CM | POA: Diagnosis present

## 2020-01-08 DIAGNOSIS — Z66 Do not resuscitate: Secondary | ICD-10-CM | POA: Diagnosis not present

## 2020-01-08 DIAGNOSIS — Z8249 Family history of ischemic heart disease and other diseases of the circulatory system: Secondary | ICD-10-CM | POA: Diagnosis not present

## 2020-01-08 DIAGNOSIS — R42 Dizziness and giddiness: Secondary | ICD-10-CM | POA: Diagnosis not present

## 2020-01-08 DIAGNOSIS — F17210 Nicotine dependence, cigarettes, uncomplicated: Secondary | ICD-10-CM | POA: Diagnosis present

## 2020-01-08 DIAGNOSIS — K5909 Other constipation: Secondary | ICD-10-CM | POA: Diagnosis present

## 2020-01-08 DIAGNOSIS — Z789 Other specified health status: Secondary | ICD-10-CM | POA: Diagnosis present

## 2020-01-08 DIAGNOSIS — Z7982 Long term (current) use of aspirin: Secondary | ICD-10-CM | POA: Diagnosis not present

## 2020-01-08 DIAGNOSIS — Z7951 Long term (current) use of inhaled steroids: Secondary | ICD-10-CM | POA: Diagnosis not present

## 2020-01-08 DIAGNOSIS — F1090 Alcohol use, unspecified, uncomplicated: Secondary | ICD-10-CM | POA: Diagnosis present

## 2020-01-08 DIAGNOSIS — J439 Emphysema, unspecified: Secondary | ICD-10-CM | POA: Diagnosis not present

## 2020-01-08 DIAGNOSIS — Z800 Family history of malignant neoplasm of digestive organs: Secondary | ICD-10-CM

## 2020-01-08 DIAGNOSIS — R0602 Shortness of breath: Secondary | ICD-10-CM | POA: Diagnosis not present

## 2020-01-08 LAB — GLUCOSE, CAPILLARY: Glucose-Capillary: 108 mg/dL — ABNORMAL HIGH (ref 70–99)

## 2020-01-08 LAB — BASIC METABOLIC PANEL
Anion gap: 12 (ref 5–15)
BUN: 7 mg/dL — ABNORMAL LOW (ref 8–23)
CO2: 30 mmol/L (ref 22–32)
Calcium: 8.9 mg/dL (ref 8.9–10.3)
Chloride: 92 mmol/L — ABNORMAL LOW (ref 98–111)
Creatinine, Ser: 0.66 mg/dL (ref 0.44–1.00)
GFR calc Af Amer: >60 mL/min (ref >60)
GFR calc non Af Amer: >60 mL/min (ref >60)
Glucose, Bld: 118 mg/dL — ABNORMAL HIGH (ref 70–99)
Potassium: 4.3 mmol/L (ref 3.5–5.1)
Sodium: 134 mmol/L — ABNORMAL LOW (ref 135–145)

## 2020-01-08 LAB — CBC WITH DIFFERENTIAL/PLATELET
Abs Immature Granulocytes: 0.02 10*3/uL (ref 0.00–0.07)
Basophils Absolute: 0 10*3/uL (ref 0.0–0.1)
Basophils Relative: 0 %
Eosinophils Absolute: 0 10*3/uL (ref 0.0–0.5)
Eosinophils Relative: 0 %
HCT: 39.5 % (ref 36.0–46.0)
Hemoglobin: 12.9 g/dL (ref 12.0–15.0)
Immature Granulocytes: 0 %
Lymphocytes Relative: 12 %
Lymphs Abs: 0.6 10*3/uL — ABNORMAL LOW (ref 0.7–4.0)
MCH: 27.6 pg (ref 26.0–34.0)
MCHC: 32.7 g/dL (ref 30.0–36.0)
MCV: 84.6 fL (ref 80.0–100.0)
Monocytes Absolute: 0 10*3/uL — ABNORMAL LOW (ref 0.1–1.0)
Monocytes Relative: 1 %
Neutro Abs: 3.9 10*3/uL (ref 1.7–7.7)
Neutrophils Relative %: 87 %
Platelets: 209 10*3/uL (ref 150–400)
RBC: 4.67 MIL/uL (ref 3.87–5.11)
RDW: 14.2 % (ref 11.5–15.5)
WBC: 4.5 10*3/uL (ref 4.0–10.5)
nRBC: 0 % (ref 0.0–0.2)

## 2020-01-08 LAB — SARS CORONAVIRUS 2 BY RT PCR (HOSPITAL ORDER, PERFORMED IN ~~LOC~~ HOSPITAL LAB): SARS Coronavirus 2: NEGATIVE

## 2020-01-08 LAB — D-DIMER, QUANTITATIVE: D-Dimer, Quant: 0.5 ug/mL-FEU (ref 0.00–0.50)

## 2020-01-08 LAB — CBG MONITORING, ED: Glucose-Capillary: 121 mg/dL — ABNORMAL HIGH (ref 70–99)

## 2020-01-08 LAB — BRAIN NATRIURETIC PEPTIDE: B Natriuretic Peptide: 21.9 pg/mL (ref 0.0–100.0)

## 2020-01-08 MED ORDER — METHOCARBAMOL 500 MG PO TABS: 500.0000 mg | ORAL_TABLET | Freq: Three times a day (TID) | ORAL | Status: DC | PRN

## 2020-01-08 MED ORDER — GABAPENTIN 100 MG PO CAPS
100.0000 mg | ORAL_CAPSULE | Freq: Two times a day (BID) | ORAL | Status: DC
  Administered 2020-01-09 – 2020-01-11 (×5): 100 mg via ORAL
  Filled 2020-01-08 (×6): qty 1

## 2020-01-08 MED ORDER — FOLIC ACID 1 MG PO TABS
1.0000 mg | ORAL_TABLET | Freq: Every day | ORAL | Status: DC
  Administered 2020-01-09 – 2020-01-11 (×3): 1 mg via ORAL
  Filled 2020-01-08 (×3): qty 1

## 2020-01-08 MED ORDER — SODIUM CHLORIDE 0.9% FLUSH
3.0000 mL | Freq: Two times a day (BID) | INTRAVENOUS | Status: DC
  Administered 2020-01-08 – 2020-01-11 (×5): 3 mL via INTRAVENOUS

## 2020-01-08 MED ORDER — SODIUM CHLORIDE 0.9 % IV BOLUS
1000.0000 mL | Freq: Once | INTRAVENOUS | Status: AC
  Administered 2020-01-08: 1000 mL via INTRAVENOUS

## 2020-01-08 MED ORDER — UMECLIDINIUM-VILANTEROL 62.5-25 MCG/INH IN AEPB
1.0000 | INHALATION_SPRAY | Freq: Every day | RESPIRATORY_TRACT | Status: DC
  Administered 2020-01-09: 1 via RESPIRATORY_TRACT
  Filled 2020-01-08: qty 14

## 2020-01-08 MED ORDER — ALBUTEROL SULFATE HFA 108 (90 BASE) MCG/ACT IN AERS
6.0000 | INHALATION_SPRAY | Freq: Once | RESPIRATORY_TRACT | Status: AC
  Administered 2020-01-08: 6 via RESPIRATORY_TRACT
  Filled 2020-01-08: qty 6.7

## 2020-01-08 MED ORDER — HYDROXYZINE HCL 25 MG PO TABS
25.0000 mg | ORAL_TABLET | Freq: Two times a day (BID) | ORAL | Status: DC | PRN
  Administered 2020-01-10 (×2): 25 mg via ORAL
  Filled 2020-01-08 (×2): qty 1

## 2020-01-08 MED ORDER — METHYLPREDNISOLONE SODIUM SUCC 125 MG IJ SOLR
60.0000 mg | Freq: Four times a day (QID) | INTRAMUSCULAR | Status: AC
  Administered 2020-01-08 – 2020-01-09 (×4): 60 mg via INTRAVENOUS
  Filled 2020-01-08 (×4): qty 2

## 2020-01-08 MED ORDER — BUPROPION HCL ER (XL) 150 MG PO TB24
150.0000 mg | ORAL_TABLET | Freq: Every day | ORAL | Status: DC
  Administered 2020-01-08 – 2020-01-11 (×4): 150 mg via ORAL
  Filled 2020-01-08 (×4): qty 1

## 2020-01-08 MED ORDER — IPRATROPIUM-ALBUTEROL 0.5-2.5 (3) MG/3ML IN SOLN
3.0000 mL | Freq: Once | RESPIRATORY_TRACT | Status: AC
  Administered 2020-01-08: 3 mL via RESPIRATORY_TRACT
  Filled 2020-01-08: qty 3

## 2020-01-08 MED ORDER — NICOTINE 21 MG/24HR TD PT24
21.0000 mg | MEDICATED_PATCH | Freq: Every day | TRANSDERMAL | Status: DC
  Administered 2020-01-08 – 2020-01-11 (×4): 21 mg via TRANSDERMAL
  Filled 2020-01-08 (×4): qty 1

## 2020-01-08 MED ORDER — IPRATROPIUM BROMIDE HFA 17 MCG/ACT IN AERS
2.0000 | INHALATION_SPRAY | Freq: Once | RESPIRATORY_TRACT | Status: AC
  Administered 2020-01-08: 2 via RESPIRATORY_TRACT
  Filled 2020-01-08: qty 12.9

## 2020-01-08 MED ORDER — ACETAMINOPHEN 325 MG PO TABS: 650.0000 mg | ORAL_TABLET | Freq: Four times a day (QID) | ORAL | Status: DC | PRN

## 2020-01-08 MED ORDER — ENOXAPARIN SODIUM 40 MG/0.4ML ~~LOC~~ SOLN
40.0000 mg | SUBCUTANEOUS | Status: DC
  Administered 2020-01-08 – 2020-01-10 (×3): 40 mg via SUBCUTANEOUS
  Filled 2020-01-08 (×3): qty 0.4

## 2020-01-08 MED ORDER — PANTOPRAZOLE SODIUM 40 MG PO TBEC
40.0000 mg | DELAYED_RELEASE_TABLET | Freq: Every day | ORAL | Status: DC
  Administered 2020-01-08 – 2020-01-11 (×4): 40 mg via ORAL
  Filled 2020-01-08 (×4): qty 1

## 2020-01-08 MED ORDER — AZITHROMYCIN 250 MG PO TABS
500.0000 mg | ORAL_TABLET | Freq: Every day | ORAL | Status: DC
  Administered 2020-01-09 – 2020-01-11 (×3): 500 mg via ORAL
  Filled 2020-01-08 (×3): qty 2

## 2020-01-08 MED ORDER — SERTRALINE HCL 50 MG PO TABS
150.0000 mg | ORAL_TABLET | Freq: Every day | ORAL | Status: DC
  Administered 2020-01-08 – 2020-01-11 (×4): 150 mg via ORAL
  Filled 2020-01-08: qty 1
  Filled 2020-01-08 (×2): qty 3
  Filled 2020-01-08: qty 1

## 2020-01-08 MED ORDER — PREDNISONE 20 MG PO TABS
40.0000 mg | ORAL_TABLET | Freq: Every day | ORAL | Status: DC
  Filled 2020-01-08: qty 2

## 2020-01-08 MED ORDER — MIRTAZAPINE 15 MG PO TABS
15.0000 mg | ORAL_TABLET | Freq: Every day | ORAL | Status: DC
  Administered 2020-01-08 – 2020-01-10 (×3): 15 mg via ORAL
  Filled 2020-01-08: qty 1
  Filled 2020-01-08: qty 2
  Filled 2020-01-08: qty 1

## 2020-01-08 MED ORDER — LEVALBUTEROL HCL 0.63 MG/3ML IN NEBU: 0.6300 mg | INHALATION_SOLUTION | Freq: Four times a day (QID) | RESPIRATORY_TRACT | Status: DC | PRN

## 2020-01-08 MED ORDER — SODIUM CHLORIDE 0.9 % IV SOLN
500.0000 mg | INTRAVENOUS | Status: AC
  Administered 2020-01-08: 500 mg via INTRAVENOUS
  Filled 2020-01-08: qty 500

## 2020-01-08 MED ORDER — LINACLOTIDE 145 MCG PO CAPS
290.0000 ug | ORAL_CAPSULE | Freq: Every day | ORAL | Status: DC
  Administered 2020-01-09 – 2020-01-11 (×3): 290 ug via ORAL
  Filled 2020-01-08 (×3): qty 2

## 2020-01-08 MED ORDER — ACETAMINOPHEN 650 MG RE SUPP: 650.0000 mg | Freq: Four times a day (QID) | RECTAL | Status: DC | PRN

## 2020-01-08 MED ORDER — ASPIRIN EC 81 MG PO TBEC
81.0000 mg | DELAYED_RELEASE_TABLET | Freq: Every day | ORAL | Status: DC
  Administered 2020-01-08 – 2020-01-11 (×4): 81 mg via ORAL
  Filled 2020-01-08 (×4): qty 1

## 2020-01-08 NOTE — ED Provider Notes (Signed)
  Physical Exam  BP 120/85   Pulse (!) 125   Temp 98.2 F (36.8 C) (Oral)   Resp 19   SpO2 97%   Physical Exam   Gen: appears short of breath, otherwise nontoxic Pulm: expiratory wheezing in all fields. tachypneic on my exam CV: tachycardic in the 130s  ED Course/Procedures     Procedures  MDM   Patient signed out to me by Raquel James, PA-C.  Please see previous notes for further history.  In brief, patient presented for evaluation of worsening shortness of breath.  She has a history of COPD, is on 4 L at baseline.  Today, she became so short of breath that she had a syncopal event.  EMS found her to be extremely wheezy, gave her 4 of magnesium and 125 of Solu-Medrol.  X-ray negative for acute findings such as pneumonia.  Labs overall reassuring.  Patient is tachypneic, although this appears to be her baseline.  Also exacerbated by the amount of albuterol she has had.  Patient has had 6 puffs of albuterol as well as 2 breathing treatments.  On reassessment, patient is still extremely wheezy.  She is ambulatory without desaturation, however after ambulating, patient needs to tripod in the bed and has clear accessory muscle use.  This improves with rest.  Discussed option of admission due to her tachypnea and continued shortness of breath first discharge home with close monitoring.  Patient does not feel she is safe to to go home.  As such, will call for admission.  Discussed with Dr. Neysa Bonito from [facility], patient to be admitted.      Franchot Heidelberg, PA-C 01/08/20 [place], [place], DO 01/08/20 1620

## 2020-01-08 NOTE — ED Notes (Signed)
Pt ambulated in room and oxygen sats remained above 90% on baseline 4L [place].

## 2020-01-08 NOTE — ED Triage Notes (Signed)
Pt BIB [facility] from home. She called out after LOC in her bathroom. Also reports difficulty breathing. Breathing noted in all fields. She is on 4L at all times at home. Received 4g of mag and 125mg  of solumedrol en route. 20g in her L hand.

## 2020-01-08 NOTE — ED Notes (Addendum)
Pts friend Nira Conn took pts cell phone home bc it was dead and she had no Games developer. Pt did not arrive with keys or any other belongings other than clothes.

## 2020-01-08 NOTE — H&P (Signed)
History and Physical        Hospital Admission Note Date: 01/08/2020  Patient name: Christine Serrano Medical record number: [id number] Date of birth: January 18, 1961 Age: 59 y.o. Gender: female  PCP: Ria Bush, MD  Patient coming from: home   Chief Complaint    Chief Complaint  Patient presents with  . Shortness of Breath  . Loss of Consciousness      HPI:   This is a 59 year old female with past medical history of chronic hypoxic respiratory failure secondary to COPD on 4 L/min O2 at baseline, tobacco use, anxiety/depression who presented to [facility] ED on 7/24 via EMS from home after reports of difficulty breathing and an episode of syncope this afternoon. Apparently the patient's shortness of breath has been worsening over the past week however she states that yesterday after visiting her horses at the barn he began having increased shortness of breath. She was seen by her PCP who restarted her home Spiriva and Symbicort and prescribed a steroid taper. Unfortunately, the inhalers did not help at home. She was going to get the prescription this morning but syncopized in the bathroom with LOC lasting several seconds head trauma or any other injuries and called EMS. When EMS arrived, she did not have any increased O2 requirement but she was given 4 mg of mag and 125 mg of Solu-Medrol. Admits to having weight gain intentionally more recently, no leg swelling, no nausea or vomiting, chest pain or palpitations. She has not been vaccinated against COVID-19.  ED Course: Tachycardic but otherwise vitals stable on baseline 4 L/min nasal cannula. CXR with emphysema or lobe scarring but otherwise negative. Notable labs: Sodium 134, D-dimer negative, COVID-19 negative. She was given Atrovent x1 and DuoNeb's x2 with minimal relief but still very wheezy.   Vitals:   01/08/20 1636 01/08/20 1645  BP:   108/77  Pulse:  (!) 121  Resp:  16  Temp: 98.6 F (37 C)   SpO2:  100%     Review of Systems:  Review of Systems  Constitutional: Negative for chills, fever and weight loss.  Respiratory: Positive for cough, shortness of breath and wheezing. Negative for sputum production.   Cardiovascular: Negative for chest pain, palpitations and leg swelling.  Gastrointestinal: Negative for abdominal pain, nausea and vomiting.  Genitourinary: Negative.   Musculoskeletal: Positive for falls.  Neurological: Negative.   All other systems reviewed and are negative.   Medical/Social/Family History   Past Medical History: Past Medical History:  Diagnosis Date  . Cervical cancer ([place])   . Chronic idiopathic constipation   . Community acquired pneumonia of right upper lobe of lung 12/10/2016  . COPD (chronic obstructive pulmonary disease) ([place])   . Depression   . Endometriosis   . HLD (hyperlipidemia)   . Smoker     Past Surgical History:  Procedure Laterality Date  . ABLATION ON ENDOMETRIOSIS    . APPENDECTOMY    . BREAST EXCISIONAL BIOPSY Left age 67   benign biopsy - chronic fatty deposit  . COLONOSCOPY WITH PROPOFOL N/A 08/04/2017   TAx3, angiodysplastic lesion, diverticulosis, rpt 5 yrs ([place])  . EXPLORATORY LAPAROTOMY  1992   endometriosis  . FOOT SURGERY Right  needle imbeded  . LASER ABLATION CONDYLOMA CERVICAL / VULVAR    . NASAL SEPTUM SURGERY Bilateral   . OVARIAN CYST SURGERY Bilateral 1980   remote  . SALPINGOOPHORECTOMY Right 1983   ectopic pregnancy  . TONSILLECTOMY      Medications: Prior to Admission medications   Medication Sig Start Date End Date Taking? Authorizing Provider  albuterol (PROVENTIL) (2.5 MG/3ML) 0.083% nebulizer solution Take 3 mLs (2.5 mg total) by nebulization every 2 (two) hours as needed for wheezing. 12/17/19 01/16/20  Terrilee Croak, MD  arformoterol (BROVANA) 15 MCG/2ML NEBU Take 2 mLs (15 mcg total) by nebulization 2 (two) times  daily. 12/17/19 01/16/20  Terrilee Croak, MD  Ascorbic Acid (VITAMIN C WITH ROSE HIPS) 500 MG tablet Take 500 mg by mouth daily.    [provider]  aspirin EC 81 MG tablet Take 81 mg by mouth daily.    [provider]  budesonide-formoterol (SYMBICORT) 160-4.5 MCG/ACT inhaler Inhale 2 puffs into the lungs 2 (two) times daily. 01/07/20   Ria Bush, MD  buPROPion (WELLBUTRIN XL) 150 MG 24 hr tablet Take 1 tablet (150 mg total) by mouth daily. 12/17/19   Ria Bush, MD  Calcium Ascorbate 500 MG TABS Take 1 tablet (500 mg total) by mouth daily. 07/01/19   Ria Bush, MD  cholecalciferol (VITAMIN D3) 25 MCG (1000 UNIT) tablet Take 1,000 Units by mouth daily.    [provider]  fluticasone (FLONASE) 50 MCG/ACT nasal spray Place 2 sprays into both nostrils daily. Patient taking differently: Place 2 sprays into both nostrils daily as needed for allergies or rhinitis.  07/12/19   Ria Bush, MD  folic acid (FOLVITE) 1 MG tablet Take 1 tablet (1 mg total) by mouth daily. 10/15/19   Ria Bush, MD  gabapentin (NEURONTIN) 100 MG capsule Take 1 capsule (100 mg total) by mouth 2 (two) times daily. 10/15/19   Ria Bush, MD  guaiFENesin (MUCINEX) 600 MG 12 hr tablet Take 1 tablet (600 mg total) by mouth 2 (two) times daily. 12/17/19 01/16/20  Terrilee Croak, MD  hydrOXYzine (ATARAX/VISTARIL) 25 MG tablet TAKE 1 TABLET (25 MG TOTAL) BY MOUTH 2 (TWO) TIMES DAILY AS NEEDED FOR ANXIETY. Patient taking differently: Take 25 mg by mouth 2 (two) times daily as needed for anxiety.  10/02/19   Ria Bush, MD  LINZESS 290 MCG CAPS capsule Take 1 capsule (290 mcg total) by mouth daily as needed (constipation). Patient taking differently: Take 290 mcg by mouth daily before breakfast.  07/13/19   Ria Bush, MD  meclizine (ANTIVERT) 25 MG tablet Take 1 tablet (25 mg total) by mouth 3 (three) times daily as needed for dizziness. 12/17/19   Ria Bush, MD    methocarbamol (ROBAXIN) 500 MG tablet Take 1 tablet (500 mg total) by mouth 3 (three) times daily as needed for muscle spasms (sedation precautions). 12/17/19   Ria Bush, MD  mirtazapine (REMERON) 15 MG tablet TAKE 1 TABLET BY MOUTH EVERYDAY AT BEDTIME Patient taking differently: Take 15 mg by mouth at bedtime.  12/03/19   Ria Bush, MD  Multiple Vitamin (MULTIVITAMIN WITH MINERALS) TABS tablet Take 1 tablet by mouth daily. 09/12/19   Aline August, MD  naproxen (NAPROSYN) 375 MG tablet Take 1 tablet (375 mg total) by mouth 2 (two) times daily as needed for moderate pain. 10/10/19   Ria Bush, MD  omeprazole (PRILOSEC) 40 MG capsule TAKE 1 CAPSULE BY MOUTH DAILY. FOR THREE WEEKS THEN AS NEEDED. TAKE 30 MIN PRIOR TO  LARGE MEAL Patient taking differently: Take 40 mg by mouth daily.  12/09/19   Ria Bush, MD  OXYGEN Inhale 3 L/min into the lungs continuous.    [provider]  predniSONE (DELTASONE) 20 MG tablet Take two tablets daily for 3 days followed by one tablet daily for 4 days Patient taking differently: Take 20-40 mg by mouth See admin instructions. Take 2 tablets (40mg ) by mouth daily for 3 days followed by 1 tablet (20mg ) by mouth daily for 4 days 01/07/20   Ria Bush, MD  sertraline (ZOLOFT) 100 MG tablet Take 1.5 tablets (150 mg total) by mouth daily. 07/19/19   Ria Bush, MD  thiamine 100 MG tablet Take 1 tablet (100 mg total) by mouth daily. 09/12/19   Aline August, MD  tiotropium (SPIRIVA HANDIHALER) 18 MCG inhalation capsule Place 1 capsule (18 mcg total) into inhaler and inhale daily. 01/07/20   Ria Bush, MD  budesonide (PULMICORT) 0.5 MG/2ML nebulizer solution Take 2 mLs (0.5 mg total) by nebulization 2 (two) times daily. 12/17/19 01/08/20  Terrilee Croak, MD    Allergies:   Allergies  Allergen Reactions  . Terbinafine And Related Diarrhea    Diarrheal illness while on terbinafine ?related to med  . Imitrex [Sumatriptan]  Other (See Comments)    Worsens migraine     Social History:  reports that she has been smoking cigarettes. She has a 150.00 pack-year smoking history. She has never used smokeless tobacco. She reports current alcohol use. She reports that she does not use drugs.  Family History: Family History  Problem Relation Age of Onset  . Heart disease Mother   . Hypertension Mother   . Hypertension Father   . COPD Father   . Hearing loss Maternal Grandmother   . Hypertension Maternal Grandmother   . Hearing loss Maternal Grandfather   . Hypertension Maternal Grandfather   . Colon cancer Paternal Grandmother      Objective   Physical Exam: Blood pressure 108/77, pulse (!) 121, temperature 98.6 F (37 C), resp. rate 16, SpO2 100 %.  Physical Exam Vitals and nursing note reviewed.  Constitutional:      General: She is not in acute distress.    Appearance: Normal appearance.     Comments: Appears older than stated age  HENT:     Head: Normocephalic and atraumatic.  Eyes:     Conjunctiva/sclera: Conjunctivae normal.  Cardiovascular:     Rate and Rhythm: Regular rhythm. Tachycardia present.  Pulmonary:     Effort: Pulmonary effort is normal. No tachypnea.     Breath sounds: Wheezing present. No rales.  Abdominal:     General: Abdomen is flat.     Palpations: Abdomen is soft.  Musculoskeletal:        General: No swelling or tenderness.  Skin:    Coloration: Skin is not jaundiced or pale.  Neurological:     Mental Status: She is alert. Mental status is at baseline.  Psychiatric:        Mood and Affect: Mood normal.        Behavior: Behavior normal.     LABS on Admission: I have personally reviewed all the labs and imaging below    Basic Metabolic Panel: Recent Labs  Lab 01/07/20 1317 01/08/20 1226  NA 135 134*  K 5.1 4.3  CL 92* 92*  CO2 37* 30  GLUCOSE 84 118*  BUN 11 7*  CREATININE 0.85 0.66  CALCIUM 10.4 8.9   Liver Function Tests: No  results for input(s):  AST, ALT, ALKPHOS, BILITOT, PROT, ALBUMIN in the last 168 hours. No results for input(s): LIPASE, AMYLASE in the last 168 hours. No results for input(s): AMMONIA in the last 168 hours. CBC: Recent Labs  Lab 01/08/20 1226  WBC 4.5  NEUTROABS 3.9  HGB 12.9  HCT 39.5  MCV 84.6  PLT 209   Cardiac Enzymes: No results for input(s): CKTOTAL, CKMB, CKMBINDEX, TROPONINI in the last 168 hours. BNP: Invalid input(s): POCBNP CBG: Recent Labs  Lab 01/08/20 0729 01/08/20 1224  GLUCAP 108* 121*    Radiological Exams on Admission:  DG Chest 2 View  Result Date: 01/08/2020 CLINICAL DATA:  Short of breath.  History of COPD. EXAM: CHEST - 2 VIEW COMPARISON:  12/10/2019 FINDINGS: The heart size is normal. Prominence of the pulmonary arteries noted bilaterally suggestive of PA hypertension. No pleural effusion or edema identified. The lungs are hyperinflated with interstitial changes of emphysema. Bilateral upper lobe scarring and architectural distortion with upward retraction of the hilar structures noted, unchanged. No superimposed pleural effusion, pulmonary edema or airspace consolidation. IMPRESSION: 1. No active cardiopulmonary abnormalities. 2. Emphysema with chronic bilateral upper lobe scarring. Electronically Signed   By: Kerby Moors M.D.   On: 01/08/2020 08:08      EKG: Not in chart   A & P   Principal Problem:   COPD exacerbation (HCC) Active Problems:   Chronic constipation   Alcohol abuse, in remission   COPD, very severe (HCC)   Anxiety   1. COPD exacerbation with chronic hypoxic respiratory failure a. Afebrile and hemodynamically stable on baseline 4 L/min b. COVID-19 negative c. D-dimer negative d. Could be from reactive airway disease after visiting her horses prior to worsening of her symptoms yesterday? e. Check BNP f. Advised to abstain from tobacco - smokes 1 ppd g. Continue IV steroids h. Add azithromycin i. Adding Anoro ellipta j. Add Xopenex as  needed k. Needs medication optimization at discharge. Has expressed financial barrier to getting certain meds in the past. SW consulted   2. Syncopal episode likely secondary to hyperventilation in setting of COPD exacerbation a. Continue telemetry b. No further work-up for now but continue to closely monitor  3. Anxiety/depression a. Continue home meds  4. Tobacco use a. Advised cessation b. Nicotine patch  5. Alcohol use a. Has a few drinks per week but did not quantify, last drink last night.  b. CIWA protocol  6. Chronic constipation a. Continue home linzess   DVT prophylaxis: lovenox   Code Status: DNR  Diet: heart healthy Family Communication: Admission, patients condition and plan of care including tests being ordered have been discussed with the patient who indicates understanding and agrees with the plan and Code Status.  Disposition Plan: The appropriate patient status for this patient is INPATIENT. Inpatient status is judged to be reasonable and necessary in order to provide the required intensity of service to ensure the patient's safety. The patient's presenting symptoms, physical exam findings, and initial radiographic and laboratory data in the context of their chronic comorbidities is felt to place them at high risk for further clinical deterioration. Furthermore, it is not anticipated that the patient will be medically stable for discharge from the hospital within 2 midnights of admission. The following factors support the patient status of inpatient.   " The patient's presenting symptoms include syncope, shortness of breath. " The worrisome physical exam findings include wheeze. " The initial radiographic and laboratory data are worrisome because of none. "  The chronic co-morbidities include COPD on chronic 4 L/min.   * I certify that at the point of admission it is my clinical judgment that the patient will require inpatient hospital care spanning beyond 2  midnights from the point of admission due to high intensity of service, high risk for further deterioration and high frequency of surveillance required.*   Status is: Inpatient  Remains inpatient appropriate because:IV treatments appropriate due to intensity of illness or inability to take PO and Inpatient level of care appropriate due to severity of illness   Dispo: The patient is from: Home              Anticipated d/c is to: Home              Anticipated d/c date is: 3 days              Patient currently is not medically stable to d/c.    Consultants  . none  Procedures  . none  Time Spent on Admission: 68 minutes    Harold Hedge, DO [facility] Pager [phone] 01/08/2020, 5:06 PM

## 2020-01-08 NOTE — ED Provider Notes (Signed)
  Face-to-face evaluation   History: She presents for evaluation of syncope and shortness of breath.  She saw her PCP yesterday and was started on prednisone taper for exacerbation of COPD.  She was encouraged to use her nebulizer inhaler as well.  Physical exam: Elderly female alert, with tachypnea and tachycardia.  Lungs with generalized rhonchi, and decreased air movement bilaterally.  No increased work of breathing.  Medical screening examination/treatment/procedure(s) were conducted as a shared visit with non-physician practitioner(s) and myself.  I personally evaluated the patient during the encounter    Daleen Bo, MD 01/09/20 [phone]

## 2020-01-08 NOTE — ED Provider Notes (Signed)
[facility] DEPT Provider Note   CSN: [id number] Arrival date & time: 01/08/20  4401     History Chief Complaint  Patient presents with  . Shortness of Breath  . Loss of Consciousness    Christine Serrano is a 59 y.o. female with COPD, chronic respiratory failure on 4L of O2, hx of cervical cancer who presents with SOB and syncope. She states that she has been having increasing difficulty breathing over the past 1 week. Her symptoms are the same as to when she was admitted last month for a COPD exacerbation. She reports associated coughing and wheezing. She continues to smoke cigarettes. She saw her doctor yesterday for a post-hospital visit and was prescribed prednisone but she states she hasn't started it yet. She was re-prescribed Spiriva and Symbicort and just started back on these. She was going to get the prescription this morning but she passed out in the bathroom so called EMS. She states she was very SOB prior to passing out and thinks that's why. She denies ever passing out due to SOB in the past. EMS gave her 4mg  of Mag and 125 of Solu-medrol. She did not have an increased O2 requirement. She is using her albuterol inhaler and nebulizer. She denies fever, chills, chest pain, abdominal pain, N/V, leg swelling. She has had some dizziness with ambulation and standing. She has not been vaccinated against COVID.  HPI     Past Medical History:  Diagnosis Date  . Cervical cancer ([place])   . Chronic idiopathic constipation   . Community acquired pneumonia of right upper lobe of lung 12/10/2016  . COPD (chronic obstructive pulmonary disease) ([place])   . Depression   . Endometriosis   . HLD (hyperlipidemia)   . Smoker     Patient Active Problem List   Diagnosis Date Noted  . Rhomboid muscle strain, initial encounter 11/10/2019  . Palliative care status 10/23/2019  . Impaired mobility 10/20/2019  . Chronic respiratory failure with hypoxia, on home O2  therapy ([place]) 10/20/2019  . Memory difficulties 10/16/2019  . Folate deficiency 09/17/2019  . Dizziness 09/11/2019  . Anemia 09/11/2019  . Hypocalcemia 09/11/2019  . Carotid stenosis, asymptomatic, left 04/06/2019  . Systolic murmur 02/72/5366  . Left carotid bruit 02/27/2019  . Hypervitaminosis D 02/27/2019  . Medicare annual wellness visit, subsequent 02/25/2019  . Advanced directives, counseling/discussion 02/25/2019  . Epigastric abdominal pain 09/21/2018  . Epistaxis 09/21/2018  . COPD exacerbation ([place]) 09/02/2018  . Osteoporosis 05/23/2018  . Numbness of right hand 10/10/2017  . Nasal sinus congestion 10/10/2017  . Benign neoplasm of ascending colon   . Benign neoplasm of transverse colon   . Benign neoplasm of sigmoid colon   . Positive hepatitis C antibody test 02/23/2017  . Health maintenance examination 02/21/2017  . HLD (hyperlipidemia) 02/21/2017  . Anxiety 12/10/2016  . Tobacco abuse 11/28/2016  . Aortic atherosclerosis ([place]) 11/26/2016  . Tachycardia 11/01/2016  . Pulmonary nodule 10/25/2016  . Moderate recurrent major depression ([place]) 07/03/2016  . Chronic constipation 07/03/2016  . Alcohol abuse, in remission 07/03/2016  . COPD, very severe ([place]) 07/03/2016  . Occasional tremors 07/03/2016  . Chronic insomnia 07/03/2016    Past Surgical History:  Procedure Laterality Date  . ABLATION ON ENDOMETRIOSIS    . APPENDECTOMY    . BREAST EXCISIONAL BIOPSY Left age 31   benign biopsy - chronic fatty deposit  . COLONOSCOPY WITH PROPOFOL N/A 08/04/2017   TAx3, angiodysplastic lesion, diverticulosis, rpt 5 yrs ([place])  .  EXPLORATORY LAPAROTOMY  1992   endometriosis  . FOOT SURGERY Right    needle imbeded  . LASER ABLATION CONDYLOMA CERVICAL / VULVAR    . NASAL SEPTUM SURGERY Bilateral   . OVARIAN CYST SURGERY Bilateral 1980   remote  . SALPINGOOPHORECTOMY Right 1983   ectopic pregnancy  . TONSILLECTOMY       OB History   No obstetric history on  file.     Family History  Problem Relation Age of Onset  . Heart disease Mother   . Hypertension Mother   . Hypertension Father   . COPD Father   . Hearing loss Maternal Grandmother   . Hypertension Maternal Grandmother   . Hearing loss Maternal Grandfather   . Hypertension Maternal Grandfather   . Colon cancer Paternal Grandmother     Social History   Tobacco Use  . Smoking status: Current Every Day Smoker    Packs/day: 3.00    Years: 50.00    Pack years: 150.00    Types: Cigarettes  . Smokeless tobacco: Never Used  . Tobacco comment: 10/18/19 pt somkes 2 packs per day ARJ  Vaping Use  . Vaping Use: Never used  Substance Use Topics  . Alcohol use: Yes    Comment: occasional  . Drug use: No    Home Medications Prior to Admission medications   Medication Sig Start Date End Date Taking? Authorizing Provider  albuterol (PROVENTIL) (2.5 MG/3ML) 0.083% nebulizer solution Take 3 mLs (2.5 mg total) by nebulization every 2 (two) hours as needed for wheezing. 12/17/19 01/16/20  Terrilee Croak, MD  arformoterol (BROVANA) 15 MCG/2ML NEBU Take 2 mLs (15 mcg total) by nebulization 2 (two) times daily. 12/17/19 01/16/20  Terrilee Croak, MD  Ascorbic Acid (VITAMIN C WITH ROSE HIPS) 500 MG tablet Take 500 mg by mouth daily.    [provider]  aspirin EC 81 MG tablet Take 81 mg by mouth daily.    [provider]  budesonide (PULMICORT) 0.5 MG/2ML nebulizer solution Take 2 mLs (0.5 mg total) by nebulization 2 (two) times daily. 12/17/19 01/16/20  Terrilee Croak, MD  budesonide-formoterol (SYMBICORT) 160-4.5 MCG/ACT inhaler Inhale 2 puffs into the lungs 2 (two) times daily. 01/07/20   Ria Bush, MD  buPROPion (WELLBUTRIN XL) 150 MG 24 hr tablet Take 1 tablet (150 mg total) by mouth daily. 12/17/19   Ria Bush, MD  Calcium Ascorbate 500 MG TABS Take 1 tablet (500 mg total) by mouth daily. 07/01/19   Ria Bush, MD  cholecalciferol (VITAMIN D3) 25 MCG (1000 UNIT) tablet  Take 1,000 Units by mouth daily.    [provider]  fluticasone (FLONASE) 50 MCG/ACT nasal spray Place 2 sprays into both nostrils daily. Patient taking differently: Place 2 sprays into both nostrils daily as needed for allergies or rhinitis.  07/12/19   Ria Bush, MD  folic acid (FOLVITE) 1 MG tablet Take 1 tablet (1 mg total) by mouth daily. 10/15/19   Ria Bush, MD  gabapentin (NEURONTIN) 100 MG capsule Take 1 capsule (100 mg total) by mouth 2 (two) times daily. 10/15/19   Ria Bush, MD  guaiFENesin (MUCINEX) 600 MG 12 hr tablet Take 1 tablet (600 mg total) by mouth 2 (two) times daily. 12/17/19 01/16/20  Terrilee Croak, MD  hydrOXYzine (ATARAX/VISTARIL) 25 MG tablet TAKE 1 TABLET (25 MG TOTAL) BY MOUTH 2 (TWO) TIMES DAILY AS NEEDED FOR ANXIETY. Patient taking differently: Take 25 mg by mouth 2 (two) times daily as needed for anxiety.  10/02/19  Ria Bush, MD  LINZESS 290 MCG CAPS capsule Take 1 capsule (290 mcg total) by mouth daily as needed (constipation). Patient taking differently: Take 290 mcg by mouth daily before breakfast.  07/13/19   Ria Bush, MD  meclizine (ANTIVERT) 25 MG tablet Take 1 tablet (25 mg total) by mouth 3 (three) times daily as needed for dizziness. 12/17/19   Ria Bush, MD  methocarbamol (ROBAXIN) 500 MG tablet Take 1 tablet (500 mg total) by mouth 3 (three) times daily as needed for muscle spasms (sedation precautions). 12/17/19   Ria Bush, MD  mirtazapine (REMERON) 15 MG tablet TAKE 1 TABLET BY MOUTH EVERYDAY AT BEDTIME Patient taking differently: Take 15 mg by mouth at bedtime.  12/03/19   Ria Bush, MD  Multiple Vitamin (MULTIVITAMIN WITH MINERALS) TABS tablet Take 1 tablet by mouth daily. 09/12/19   Aline August, MD  naproxen (NAPROSYN) 375 MG tablet Take 1 tablet (375 mg total) by mouth 2 (two) times daily as needed for moderate pain. 10/10/19   Ria Bush, MD  omeprazole (PRILOSEC) 40 MG capsule  TAKE 1 CAPSULE BY MOUTH DAILY. FOR THREE WEEKS THEN AS NEEDED. TAKE 30 MIN PRIOR TO LARGE MEAL Patient taking differently: Take 40 mg by mouth daily.  12/09/19   Ria Bush, MD  OXYGEN Inhale 3 L/min into the lungs continuous.    [provider]  predniSONE (DELTASONE) 20 MG tablet Take two tablets daily for 3 days followed by one tablet daily for 4 days 01/07/20   Ria Bush, MD  sertraline (ZOLOFT) 100 MG tablet Take 1.5 tablets (150 mg total) by mouth daily. 07/19/19   Ria Bush, MD  thiamine 100 MG tablet Take 1 tablet (100 mg total) by mouth daily. 09/12/19   Aline August, MD  tiotropium (SPIRIVA HANDIHALER) 18 MCG inhalation capsule Place 1 capsule (18 mcg total) into inhaler and inhale daily. 01/07/20   Ria Bush, MD    Allergies    Terbinafine and related and Imitrex [sumatriptan]  Review of Systems   Review of Systems  Constitutional: Negative for chills and fever.  Respiratory: Positive for cough, shortness of breath and wheezing.   Cardiovascular: Negative for chest pain and leg swelling.  Gastrointestinal: Negative for abdominal pain, nausea and vomiting.  Neurological: Positive for dizziness, syncope and light-headedness.  All other systems reviewed and are negative.   Physical Exam Updated Vital Signs BP (!) 121/86   Pulse (!) 106   Temp 98.2 F (36.8 C) (Oral)   Resp 20   SpO2 99%   Physical Exam Vitals and nursing note reviewed.  Constitutional:      General: She is not in acute distress.    Appearance: She is well-developed. She is not ill-appearing.     Comments: Chronically ill appearing female in NAD. Mildly tachypneic with talking  HENT:     Head: Normocephalic and atraumatic.  Eyes:     General: No scleral icterus.       Right eye: No discharge.        Left eye: No discharge.     Conjunctiva/sclera: Conjunctivae normal.     Pupils: Pupils are equal, round, and reactive to light.  Cardiovascular:     Rate and  Rhythm: Regular rhythm. Tachycardia present.  Pulmonary:     Effort: Pulmonary effort is normal. Tachypnea present. No respiratory distress.     Breath sounds: Wheezing (diffuse) present.  Abdominal:     General: There is no distension.     Palpations: Abdomen is  soft.     Tenderness: There is no abdominal tenderness.  Musculoskeletal:     Cervical back: Normal range of motion.     Right lower leg: No edema.     Left lower leg: No edema.  Skin:    General: Skin is warm and dry.  Neurological:     Mental Status: She is alert and oriented to person, place, and time.  Psychiatric:        Behavior: Behavior normal.     ED Results / Procedures / Treatments   Labs (all labs ordered are listed, but only abnormal results are displayed) Labs Reviewed  GLUCOSE, CAPILLARY - Abnormal; Notable for the following components:      Result Value   Glucose-Capillary 108 (*)    All other components within normal limits  BASIC METABOLIC PANEL - Abnormal; Notable for the following components:   Sodium 134 (*)    Chloride 92 (*)    Glucose, Bld 118 (*)    BUN 7 (*)    All other components within normal limits  CBC WITH DIFFERENTIAL/PLATELET - Abnormal; Notable for the following components:   Lymphs Abs 0.6 (*)    Monocytes Absolute 0.0 (*)    All other components within normal limits  CBG MONITORING, ED - Abnormal; Notable for the following components:   Glucose-Capillary 121 (*)    All other components within normal limits  SARS CORONAVIRUS 2 BY RT PCR (HOSPITAL ORDER, [facility])  D-DIMER, QUANTITATIVE (NOT AT [facility])  CBG MONITORING, ED    EKG None  Radiology DG Chest 2 View  Result Date: 01/08/2020 CLINICAL DATA:  Short of breath.  History of COPD. EXAM: CHEST - 2 VIEW COMPARISON:  12/10/2019 FINDINGS: The heart size is normal. Prominence of the pulmonary arteries noted bilaterally suggestive of PA hypertension. No pleural effusion or edema identified. The  lungs are hyperinflated with interstitial changes of emphysema. Bilateral upper lobe scarring and architectural distortion with upward retraction of the hilar structures noted, unchanged. No superimposed pleural effusion, pulmonary edema or airspace consolidation. IMPRESSION: 1. No active cardiopulmonary abnormalities. 2. Emphysema with chronic bilateral upper lobe scarring. Electronically Signed   By: Kerby Moors M.D.   On: 01/08/2020 08:08    Procedures Procedures (including critical care time)  Medications Ordered in ED Medications  albuterol (VENTOLIN HFA) 108 (90 Base) MCG/ACT inhaler 6 puff (has no administration in time range)  ipratropium (ATROVENT HFA) inhaler 2 puff (has no administration in time range)    ED Course  I have reviewed the triage vital signs and the nursing notes.  Pertinent labs & imaging results that were available during my care of the patient were reviewed by me and considered in my medical decision making (see chart for details).  60 year old female presents with worsening SOB, cough, wheezing and a syncopal episode today which she attributes to her COPD exacerbation. She is on her baseline O2. She is tachycardic which seems to be a chronic issue for her. BP is normal. She is afebrile. She is alert and cooperative on exam with mild tachypenea. She has diffuse expiratory wheezes. EKG is SR. CXR shows COPD and is without acute change. Will obtain labs, orthostatics and give inhaler treatment.   Labs show mild hyponatremia and hypocholoremia. D-dimer is negative making PE unlikely. She feels "a little" better after inhalers. COVID test is pending and will plan on nebulizer tx to see if we can keep her out of the hospital.  Shared visit with Dr. Eulis Foster.  2:00 PM COVID is negative. Duoneb ordered.  3:09 PM Rechecked pt. She is sleeping. Still wheezing on lung exam. Will do another neb and have her ambulate afterwards. At shift change this is pending. Care signed out to  [place] PA-C who will dispo   MDM Rules/Calculators/A&P                           Final Clinical Impression(s) / ED Diagnoses Final diagnoses:  COPD exacerbation [facility])    Rx / DC Orders ED Discharge Orders    None       Recardo Evangelist, PA-C 01/08/20 1510    Daleen Bo, MD 01/09/20 [phone]

## 2020-01-09 LAB — CBC
HCT: 37 % (ref 36.0–46.0)
Hemoglobin: 11.9 g/dL — ABNORMAL LOW (ref 12.0–15.0)
MCH: 27.7 pg (ref 26.0–34.0)
MCHC: 32.2 g/dL (ref 30.0–36.0)
MCV: 86 fL (ref 80.0–100.0)
Platelets: 206 10*3/uL (ref 150–400)
RBC: 4.3 MIL/uL (ref 3.87–5.11)
RDW: 14.3 % (ref 11.5–15.5)
WBC: 4 10*3/uL (ref 4.0–10.5)
nRBC: 0 % (ref 0.0–0.2)

## 2020-01-09 LAB — BASIC METABOLIC PANEL
Anion gap: 7 (ref 5–15)
BUN: 11 mg/dL (ref 8–23)
CO2: 31 mmol/L (ref 22–32)
Calcium: 8.9 mg/dL (ref 8.9–10.3)
Chloride: 103 mmol/L (ref 98–111)
Creatinine, Ser: 0.59 mg/dL (ref 0.44–1.00)
GFR calc Af Amer: >60 mL/min (ref >60)
GFR calc non Af Amer: >60 mL/min (ref >60)
Glucose, Bld: 143 mg/dL — ABNORMAL HIGH (ref 70–99)
Potassium: 4.6 mmol/L (ref 3.5–5.1)
Sodium: 141 mmol/L (ref 135–145)

## 2020-01-09 LAB — HIV ANTIBODY (ROUTINE TESTING W REFLEX): HIV Screen 4th Generation wRfx: NONREACTIVE

## 2020-01-09 NOTE — Evaluation (Addendum)
Physical Therapy Evaluation Patient Details Name: Christine Serrano MRN: [id number] DOB: Aug 07, 1960 Today's Date: 01/09/2020   History of Present Illness  59 year old female with PMH: chronic hypoxic respiratory failure d/t  COPD on 4 L/min O2 at baseline, tobacco use, anxiety/depression who presented to [facility] ED on 7/24 via EMS from home after reports of difficulty breathing and an episode of syncope this afternoon  Clinical Impression  Pt admitted with above diagnosis.  Pt  very pleasant and  cooperative. Reviewed diaphragmatic breathing, correct posture and efficiency of breathing.  Will continue to follow in acute setting.   Pt currently with functional limitations due to the deficits listed below (see PT Problem List). Pt will benefit from skilled PT to increase their independence and safety with mobility to allow discharge to the venue listed below.       Follow Up Recommendations No PT follow up    Equipment Recommendations  Other (comment) (rollator)    Recommendations for Other Services       Precautions / Restrictions Precautions Precautions: Fall Precaution Comments: 4L O2 at baseline; monitor HR and O2 Restrictions Weight Bearing Restrictions: No      Mobility  Bed Mobility Overal bed mobility: Needs Assistance Bed Mobility: Supine to Sit     Supine to sit: Supervision     General bed mobility comments: for safety, assist with lines  Transfers Overall transfer level: Needs assistance Equipment used: Rolling walker (2 wheeled)             General transfer comment: deferred d/t incr HR in sitting (up to 124)  Ambulation/Gait                Stairs            Wheelchair Mobility    Modified Rankin (Stroke Patients Only)       Balance Overall balance assessment: Needs assistance Sitting-balance support: No upper extremity supported;Bilateral upper extremity supported;Feet unsupported Sitting balance-Leahy Scale: Good                                        Pertinent Vitals/Pain Pain Assessment: No/denies pain    Home Living Family/patient expects to be discharged to:: Private residence Living Arrangements: Other relatives (Sister in Sports coach) Available Help at Discharge: Friend(s);Available PRN/intermittently Type of Home: Mobile home Home Access: Stairs to enter Entrance Stairs-Rails: None Entrance Stairs-Number of Steps: 2 Home Layout: One level Home Equipment: None;Grab bars - toilet Additional Comments: pt reports her sister in law assists prn. she has been working on getting a scooter; recently pt reports decr in distance she has been able to amb, unable to walk to [employer]    Prior Function Level of Independence: Needs assistance   Gait / Transfers Assistance Needed: Able to ambulate short distances only  due to dyspnea, incr WOB with activity  ADL's / Homemaking Assistance Needed: Reports friend (sister in law) occasionally assist with showers due to DOE; able to do light IADLs        Hand Dominance        Extremity/Trunk Assessment   Upper Extremity Assessment Upper Extremity Assessment: Generalized weakness    Lower Extremity Assessment Lower Extremity Assessment: Generalized weakness       Communication   Communication: No difficulties  Cognition Arousal/Alertness: Awake/alert Behavior During Therapy: WFL for tasks assessed/performed Overall Cognitive Status: Within Functional Limits for tasks assessed  General Comments: pt is plessant and cooperative      General Comments      Exercises Other Exercises Other Exercises: scapular retraction x5, trunk/thoracic extension, shoulder depression   Assessment/Plan    PT Assessment Patient needs continued PT services  PT Problem List Decreased activity tolerance;Cardiopulmonary status limiting activity       PT Treatment Interventions DME instruction;Therapeutic exercise;Gait  training;Functional mobility training;Therapeutic activities;Patient/family education    PT Goals (Current goals can be found in the Care Plan section)  Acute Rehab PT Goals Patient Stated Goal: feel better PT Goal Formulation: With patient Time For Goal Achievement: 12/25/19 Potential to Achieve Goals: Good    Frequency Min 3X/week   Barriers to discharge        Co-evaluation               AM-PAC PT "6 Clicks" Mobility  Outcome Measure Help needed turning from your back to your side while in a flat bed without using bedrails?: A Little Help needed moving from lying on your back to sitting on the side of a flat bed without using bedrails?: A Little Help needed moving to and from a bed to a chair (including a wheelchair)?: A Little Help needed standing up from a chair using your arms (e.g., wheelchair or bedside chair)?: A Little Help needed to walk in hospital room?: A Little Help needed climbing 3-5 steps with a railing? : A Lot 6 Click Score: 17    End of Session   Activity Tolerance: Patient limited by fatigue;Treatment limited secondary to medical complications (Comment) (incr HR) Patient left: in bed;with call bell/phone within reach   PT Visit Diagnosis: Difficulty in walking, not elsewhere classified (R26.2)    Time: 8299-3716 PT Time Calculation (min) (ACUTE ONLY): 26 min   Charges:   PT Evaluation $PT Eval Low Complexity: 1 Low PT Treatments $Therapeutic Exercise: 8-22 mins        Baxter Flattery, PT  Acute Rehab Dept ([place]) [phone] Pager [phone]  01/09/2020   [facility] 01/09/2020, 4:54 PM

## 2020-01-09 NOTE — TOC Initial Note (Signed)
Transition of Care [facility]) - Initial/Assessment Note    Patient Details  Name: Christine Serrano MRN: [id number] Date of Birth: 1961-05-05  [facility] CM/SW Contact:    Verdell Carmine, RN Phone Number: 01/09/2020, 3:36 PM  Clinical Narrative:                 Consulted for medication assistance. Unfortunately we cannot use MATCH program as the patient has insurance so she is not eligible.  Will look for other programs through [facility] that may be able to assist patient.     Barriers to Discharge: Continued Medical Work up   Patient Goals and CMS Choice        Expected Discharge Plan and Services                                                Prior Living Arrangements/Services                       Activities of Daily Living      Permission Sought/Granted                  Emotional Assessment              Admission diagnosis:  COPD exacerbation ([place]) [J44.1] Patient Active Problem List   Diagnosis Date Noted  . Rhomboid muscle strain, initial encounter 11/10/2019  . Palliative care status 10/23/2019  . Impaired mobility 10/20/2019  . Chronic respiratory failure with hypoxia, on home O2 therapy ([place]) 10/20/2019  . Memory difficulties 10/16/2019  . Folate deficiency 09/17/2019  . Dizziness 09/11/2019  . Anemia 09/11/2019  . Hypocalcemia 09/11/2019  . Carotid stenosis, asymptomatic, left 04/06/2019  . Systolic murmur 51/76/1607  . Left carotid bruit 02/27/2019  . Hypervitaminosis D 02/27/2019  . Medicare annual wellness visit, subsequent 02/25/2019  . Advanced directives, counseling/discussion 02/25/2019  . Epigastric abdominal pain 09/21/2018  . Epistaxis 09/21/2018  . COPD exacerbation ([place]) 09/02/2018  . Osteoporosis 05/23/2018  . Numbness of right hand 10/10/2017  . Nasal sinus congestion 10/10/2017  . Benign neoplasm of ascending colon   . Benign neoplasm of transverse colon   . Benign neoplasm of sigmoid  colon   . Positive hepatitis C antibody test 02/23/2017  . Health maintenance examination 02/21/2017  . HLD (hyperlipidemia) 02/21/2017  . Anxiety 12/10/2016  . Tobacco abuse 11/28/2016  . Aortic atherosclerosis ([place]) 11/26/2016  . Tachycardia 11/01/2016  . Pulmonary nodule 10/25/2016  . Moderate recurrent major depression ([place]) 07/03/2016  . Chronic constipation 07/03/2016  . Alcohol abuse, in remission 07/03/2016  . COPD, very severe ([place]) 07/03/2016  . Occasional tremors 07/03/2016  . Chronic insomnia 07/03/2016   PCP:  Ria Bush, MD Pharmacy:   [id number] [place], [address] [place] [address] Phone: [phone] Fax: [facility] [place], [place] [address] Phone: [phone] Fax: [phone]     Social Determinants of Health (SDOH) Interventions    Readmission Risk Interventions No flowsheet data found.

## 2020-01-09 NOTE — Progress Notes (Signed)
PROGRESS NOTE    Christine Serrano  TDV:[id number] DOB: 01-05-1961 DOA: 01/08/2020   PCP: Ria Bush, MD   Brief Narrative:  This is a 59 year old female with past medical history of chronic hypoxic respiratory failure secondary to COPD on 4 L/min O2 at baseline, tobacco use, anxiety/depression who presented to [facility] ED on 7/24 via EMS from home after reports of difficulty breathing and an episode of syncope this afternoon. Apparently the patient's shortness of breath has been worsening over the past week however she states that yesterday after visiting her horses at the barn she began having increased shortness of breath. She was seen by her PCP who restarted her home Spiriva and Symbicort and prescribed a steroid taper. Unfortunately, the inhalers did not help at home. She was going to get the prescription this morning but syncopized in the bathroom with LOC lasting several seconds, denies head trauma or any other injuries and called EMS. When EMS arrived, she did not have any increased O2 requirement but she was given 4 mg of mag and 125 mg of Solu-Medrol. Admits to having weight gain intentionally more recently, no leg swelling, no nausea or vomiting, chest pain or palpitations. She has not been vaccinated against COVID-19. She is admitted for COPD exacerbation.   Assessment & Plan:   Principal Problem:   COPD exacerbation ([place]) Active Problems:   Moderate recurrent major depression (HCC)   Chronic constipation   Alcohol abuse, in remission   COPD, very severe (HCC)   Tobacco abuse   Anxiety  1. COPD exacerbation with chronic hypoxic respiratory failure. a. Afebrile and hemodynamically stable on baseline 4 L/min. b. COVID-19 negative c. D-dimer negative d. Could be from reactive airway disease after visiting her horses prior to worsening of her symptoms yesterday. e. BNP 126 f. Advised to abstain from tobacco - smokes 1 ppd g. Continue IV steroids, will taper down with clinical  improvement. h. Add azithromycin i. Adding Anoro ellipta j. Add Xopenex as needed k. Needs medication optimization at discharge. Has expressed financial barrier to getting certain meds in the past. SW consulted   2. Syncopal episode likely secondary to hyperventilation in setting of COPD exacerbation a. Continue telemetry b. No further work-up for now but continue to closely monitor  3. Anxiety/depression a. Continue home meds  4. Tobacco use a. Advised cessation b. Nicotine patch  5. Alcohol use a. Has a few drinks per week but did not quantify, last drink last night.  b. CIWA protocol  6. Chronic constipation a. Continue home linzess    DVT prophylaxis: Lovenox Code Status: DNR Family Communication:  No one at bed side.  Discussed patient with detail Disposition Plan:  Dispo: The patient is from: Home  Anticipated d/c is to: Home  Anticipated d/c date is: 3 days  Patient currently is not medically stable to d/c.     Consultants:   None.  Procedures: None.  Antimicrobials: Anti-infectives (From admission, onward)   Start     Dose/Rate Route Frequency Ordered Stop   01/09/20 1700  azithromycin (ZITHROMAX) tablet 500 mg     Discontinue    "Followed by" Linked Group Details   500 mg Oral Daily 01/08/20 1647 01/13/20 0959   01/08/20 1647  azithromycin (ZITHROMAX) 500 mg in sodium chloride 0.9 % 250 mL IVPB       "Followed by" Linked Group Details   500 mg 250 mL/hr over 60 Minutes Intravenous Every 24 hours 01/08/20 1647 01/08/20 2305  Subjective: Patient is seen and examined at bedside.  She is significant wheezing, able to complete sentences.  Denies any chest pain.  No overnight events.  Objective: Vitals:   01/09/20 1049 01/09/20 1200 01/09/20 1306 01/09/20 1426  BP: (!) 130/76 120/73 120/73 125/81  Pulse: (!) 107 (!) 113 (!) 112 (!) 126  Resp: 18 21 12 22   Temp:      TempSrc:      SpO2: 99% 95% 100%  97%    Intake/Output Summary (Last 24 hours) at 01/09/2020 1427 Last data filed at 01/08/2020 2305 Gross per 24 hour  Intake 200 ml  Output --  Net 200 ml   There were no vitals filed for this visit.  Examination:  General exam: Appears calm and comfortable  Respiratory system: Significant wheezing on auscultation. Respiratory effort normal. Cardiovascular system: S1 & S2 heard, RRR. No JVD, murmurs, rubs, gallops or clicks. No pedal edema. Gastrointestinal system: Abdomen is nondistended, soft and nontender. No organomegaly or masses felt. Normal bowel sounds heard. Central nervous system: Alert and oriented. No focal neurological deficits. Extremities: Symmetric 5 x 5 power. Skin: No rashes, lesions or ulcers Psychiatry: Judgement and insight appear normal. Mood & affect appropriate.   Data Reviewed: I have personally reviewed following labs and imaging studies  CBC: Recent Labs  Lab 01/08/20 1226 01/09/20 0308  WBC 4.5 4.0  NEUTROABS 3.9  --   HGB 12.9 11.9*  HCT 39.5 37.0  MCV 84.6 86.0  PLT 209 607   Basic Metabolic Panel: Recent Labs  Lab 01/07/20 1317 01/08/20 1226 01/09/20 0308  NA 135 134* 141  K 5.1 4.3 4.6  CL 92* 92* 103  CO2 37* 30 31  GLUCOSE 84 118* 143*  BUN 11 7* 11  CREATININE 0.85 0.66 0.59  CALCIUM 10.4 8.9 8.9   GFR: Estimated Creatinine Clearance: 64 mL/min (by C-G formula based on SCr of 0.59 mg/dL). Liver Function Tests: No results for input(s): AST, ALT, ALKPHOS, BILITOT, PROT, ALBUMIN in the last 168 hours. No results for input(s): LIPASE, AMYLASE in the last 168 hours. No results for input(s): AMMONIA in the last 168 hours. Coagulation Profile: No results for input(s): INR, PROTIME in the last 168 hours. Cardiac Enzymes: No results for input(s): CKTOTAL, CKMB, CKMBINDEX, TROPONINI in the last 168 hours. BNP (last 3 results) No results for input(s): PROBNP in the last 8760 hours. HbA1C: No results for input(s): HGBA1C in the  last 72 hours. CBG: Recent Labs  Lab 01/08/20 0729 01/08/20 1224  GLUCAP 108* 121*   Lipid Profile: No results for input(s): CHOL, HDL, LDLCALC, TRIG, CHOLHDL, LDLDIRECT in the last 72 hours. Thyroid Function Tests: No results for input(s): TSH, T4TOTAL, FREET4, T3FREE, THYROIDAB in the last 72 hours. Anemia Panel: No results for input(s): VITAMINB12, FOLATE, FERRITIN, TIBC, IRON, RETICCTPCT in the last 72 hours. Sepsis Labs: No results for input(s): PROCALCITON, LATICACIDVEN in the last 168 hours.  Recent Results (from the past 240 hour(s))  SARS Coronavirus 2 by RT PCR (hospital order, performed in [facility] hospital lab) Nasopharyngeal Nasopharyngeal Swab     Status: None   Collection Time: 01/08/20 12:26 PM   Specimen: Nasopharyngeal Swab  Result Value Ref Range Status   SARS Coronavirus 2 NEGATIVE NEGATIVE Final    Comment: (NOTE) SARS-CoV-2 target nucleic acids are NOT DETECTED.  The SARS-CoV-2 RNA is generally detectable in upper and lower respiratory specimens during the acute phase of infection. The lowest concentration of SARS-CoV-2 viral copies this assay can  detect is 250 copies / mL. A negative result does not preclude SARS-CoV-2 infection and should not be used as the sole basis for treatment or other patient management decisions.  A negative result may occur with improper specimen collection / handling, submission of specimen other than nasopharyngeal swab, presence of viral mutation(s) within the areas targeted by this assay, and inadequate number of viral copies (<250 copies / mL). A negative result must be combined with clinical observations, patient history, and epidemiological information.  Fact Sheet for Patients:   StrictlyIdeas.no  Fact Sheet for Healthcare Providers: BankingDealers.co.za  This test is not yet approved or  cleared by the [place] FDA and has been authorized for detection and/or  diagnosis of SARS-CoV-2 by FDA under an Emergency Use Authorization (EUA).  This EUA will remain in effect (meaning this test can be used) for the duration of the COVID-19 declaration under Section 564(b)(1) of the Act, 21 U.S.C. section 360bbb-3(b)(1), unless the authorization is terminated or revoked sooner.  Performed at [facility], [place] [address], [place], [place] [address]      Radiology Studies: DG Chest 2 View  Result Date: 01/08/2020 CLINICAL DATA:  Short of breath.  History of COPD. EXAM: CHEST - 2 VIEW COMPARISON:  12/10/2019 FINDINGS: The heart size is normal. Prominence of the pulmonary arteries noted bilaterally suggestive of PA hypertension. No pleural effusion or edema identified. The lungs are hyperinflated with interstitial changes of emphysema. Bilateral upper lobe scarring and architectural distortion with upward retraction of the hilar structures noted, unchanged. No superimposed pleural effusion, pulmonary edema or airspace consolidation. IMPRESSION: 1. No active cardiopulmonary abnormalities. 2. Emphysema with chronic bilateral upper lobe scarring. Electronically Signed   By: Kerby Moors M.D.   On: 01/08/2020 08:08    Scheduled Meds: . aspirin EC  81 mg Oral Daily  . azithromycin  500 mg Oral Daily  . buPROPion  150 mg Oral Daily  . enoxaparin (LOVENOX) injection  40 mg Subcutaneous Q24H  . folic acid  1 mg Oral Daily  . gabapentin  100 mg Oral BID  . linaclotide  290 mcg Oral QAC breakfast  . methylPREDNISolone (SOLU-MEDROL) injection  60 mg Intravenous Q6H   Followed by  . [START ON 01/10/2020] predniSONE  40 mg Oral Q breakfast  . mirtazapine  15 mg Oral QHS  . nicotine  21 mg Transdermal Daily  . pantoprazole  40 mg Oral Daily  . sertraline  150 mg Oral Daily  . sodium chloride flush  3 mL Intravenous Q12H  . umeclidinium-vilanterol  1 puff Inhalation Daily   Continuous Infusions:   LOS: 1 day    Time spent: 25  mins.    Shawna Clamp, MD [facility]   If 7PM-7AM, please contact night-coverage

## 2020-01-10 LAB — CBC
HCT: 35.8 % — ABNORMAL LOW (ref 36.0–46.0)
Hemoglobin: 11.5 g/dL — ABNORMAL LOW (ref 12.0–15.0)
MCH: 28 pg (ref 26.0–34.0)
MCHC: 32.1 g/dL (ref 30.0–36.0)
MCV: 87.1 fL (ref 80.0–100.0)
Platelets: 207 10*3/uL (ref 150–400)
RBC: 4.11 MIL/uL (ref 3.87–5.11)
RDW: 14.6 % (ref 11.5–15.5)
WBC: 7.8 10*3/uL (ref 4.0–10.5)
nRBC: 0 % (ref 0.0–0.2)

## 2020-01-10 LAB — BASIC METABOLIC PANEL
Anion gap: 11 (ref 5–15)
BUN: 13 mg/dL (ref 8–23)
CO2: 27 mmol/L (ref 22–32)
Calcium: 9.1 mg/dL (ref 8.9–10.3)
Chloride: 98 mmol/L (ref 98–111)
Creatinine, Ser: 0.56 mg/dL (ref 0.44–1.00)
GFR calc Af Amer: >60 mL/min (ref >60)
GFR calc non Af Amer: >60 mL/min (ref >60)
Glucose, Bld: 102 mg/dL — ABNORMAL HIGH (ref 70–99)
Potassium: 4.7 mmol/L (ref 3.5–5.1)
Sodium: 136 mmol/L (ref 135–145)

## 2020-01-10 MED ORDER — GUAIFENESIN ER 600 MG PO TB12
600.0000 mg | ORAL_TABLET | Freq: Two times a day (BID) | ORAL | Status: DC
  Administered 2020-01-10 – 2020-01-11 (×3): 600 mg via ORAL
  Filled 2020-01-10 (×3): qty 1

## 2020-01-10 MED ORDER — ARFORMOTEROL TARTRATE 15 MCG/2ML IN NEBU
15.0000 ug | INHALATION_SOLUTION | Freq: Two times a day (BID) | RESPIRATORY_TRACT | Status: DC
  Administered 2020-01-10 – 2020-01-11 (×3): 15 ug via RESPIRATORY_TRACT
  Filled 2020-01-10 (×3): qty 2

## 2020-01-10 MED ORDER — METHYLPREDNISOLONE SODIUM SUCC 125 MG IJ SOLR
60.0000 mg | Freq: Two times a day (BID) | INTRAMUSCULAR | Status: DC
  Administered 2020-01-10 – 2020-01-11 (×3): 60 mg via INTRAVENOUS
  Filled 2020-01-10 (×3): qty 2

## 2020-01-10 MED ORDER — ENSURE ENLIVE PO LIQD
237.0000 mL | Freq: Two times a day (BID) | ORAL | Status: DC
  Administered 2020-01-11: 237 mL via ORAL

## 2020-01-10 MED ORDER — FLUTICASONE PROPIONATE 50 MCG/ACT NA SUSP
2.0000 | Freq: Every day | NASAL | Status: DC
  Administered 2020-01-11: 2 via NASAL
  Filled 2020-01-10: qty 16

## 2020-01-10 MED ORDER — ADULT MULTIVITAMIN W/MINERALS CH
1.0000 | ORAL_TABLET | Freq: Every day | ORAL | Status: DC
  Administered 2020-01-11: 1 via ORAL
  Filled 2020-01-10: qty 1

## 2020-01-10 MED ORDER — UMECLIDINIUM BROMIDE 62.5 MCG/INH IN AEPB
1.0000 | INHALATION_SPRAY | Freq: Every day | RESPIRATORY_TRACT | Status: DC
  Administered 2020-01-10 – 2020-01-11 (×2): 1 via RESPIRATORY_TRACT
  Filled 2020-01-10: qty 7

## 2020-01-10 MED ORDER — ALBUTEROL SULFATE (2.5 MG/3ML) 0.083% IN NEBU: 2.5000 mg | INHALATION_SOLUTION | RESPIRATORY_TRACT | Status: DC | PRN

## 2020-01-10 MED ORDER — THIAMINE HCL 100 MG PO TABS
100.0000 mg | ORAL_TABLET | Freq: Every day | ORAL | Status: DC
  Administered 2020-01-10 – 2020-01-11 (×2): 100 mg via ORAL
  Filled 2020-01-10 (×2): qty 1

## 2020-01-10 NOTE — Progress Notes (Signed)
Patient is beginning PT- RT will attempt to come back at later time.

## 2020-01-10 NOTE — Progress Notes (Signed)
PROGRESS NOTE    Christine Serrano  HTD:[id number] DOB: 12-22-1960 DOA: 01/08/2020   PCP: Ria Bush, MD   Brief Narrative:  This is a 59 year old female with past medical history of chronic hypoxic respiratory failure secondary to COPD on 4 L/min O2 at baseline, tobacco use, anxiety/depression who presented to [facility] ED on 7/24 via EMS from home after reports of difficulty breathing and an episode of syncope this afternoon. Apparently the patient's shortness of breath has been worsening over the past week however she states that yesterday after visiting her horses at the barn she began having increased shortness of breath. She was seen by her PCP who restarted her home Spiriva and Symbicort and prescribed a steroid taper. Unfortunately, the inhalers did not help at home. She was going to get the prescription this morning but syncopized in the bathroom with LOC lasting several seconds, denies head trauma or any other injuries and called EMS. When EMS arrived, she did have increased O2 requirement but she was given 4 mg of mag and 125 mg of Solu-Medrol. Admits to having weight gain intentionally more recently, no leg swelling, no nausea or vomiting, chest pain or palpitations. She has not been vaccinated against COVID-19. She is admitted for COPD exacerbation.   Assessment & Plan:   Principal Problem:   COPD exacerbation ([place]) Active Problems:   Moderate recurrent major depression (HCC)   Chronic constipation   Alcohol abuse, in remission   COPD, very severe (HCC)   Tobacco abuse   Anxiety  1. COPD exacerbation with chronic hypoxic respiratory failure - Improving. a. Afebrile and hemodynamically stable on baseline 4 L/min. b. COVID-19 negative c. D-dimer negative d. Could be from reactive airway disease after visiting her horses prior to worsening of her symptoms yesterday. e. BNP 126 f. Advised to abstain from tobacco - smokes 1 ppd Serrano. Continue IV steroids, will taper down with  clinical improvement. h. Continue azithromycin i. continue Anoro ellipta Christine. Add Xopenex as needed k. Needs medication optimization at discharge. Has expressed financial barrier to getting certain meds in the past. SW consulted   2. Syncopal episode likely secondary to hyperventilation in setting of COPD exacerbation a. Continue telemetry b. No further work-up for now but continue to closely monitor.  3. Anxiety/depression a. Continue home meds  4. Tobacco use a. Advised cessation b. Nicotine patch  5. Alcohol use a. Has a few drinks per week but did not quantify, last drink last night.  b. CIWA protocol  6. Chronic constipation a. Continue home linzess    DVT prophylaxis: Lovenox Code Status: DNR Family Communication:  No one at bed side.  Discussed patient with detail Disposition Plan:  Dispo: The patient is from: Home  Anticipated d/c is to: Home  Anticipated d/c date is: 3 days  Patient currently is not medically stable to d/c. Significant wheezing.    Consultants:   None.  Procedures: None.  Antimicrobials: Anti-infectives (From admission, onward)   Start     Dose/Rate Route Frequency Ordered Stop   01/09/20 1700  azithromycin (ZITHROMAX) tablet 500 mg     Discontinue    "Followed by" Linked Group Details   500 mg Oral Daily 01/08/20 1647 01/13/20 0959   01/08/20 1647  azithromycin (ZITHROMAX) 500 mg in sodium chloride 0.9 % 250 mL IVPB       "Followed by" Linked Group Details   500 mg 250 mL/hr over 60 Minutes Intravenous Every 24 hours 01/08/20 1647 01/08/20 2305  Subjective: Patient is seen and examined at bedside. No overnight events. She has significant wheezing, able to complete sentences.  Denies any chest pain.    Objective: Vitals:   01/10/20 0127 01/10/20 0500 01/10/20 0655 01/10/20 1153  BP: (!) 140/93 (!) 147/93    Pulse: 98 91  (!) 119  Resp: 20 18    Temp: 97.7 F (36.5 C) (!) 97.2 F  (36.2 C)    TempSrc: Oral Oral    SpO2: 100% 97%  96%  Weight:   49.2 kg    No intake or output data in the 24 hours ending 01/10/20 1308 Filed Weights   01/10/20 0655  Weight: 49.2 kg    Examination:  General exam: Appears calm and comfortable  Respiratory system: Significant wheezing on auscultation. Respiratory effort normal. Cardiovascular system: S1 & S2 heard, RRR. No JVD, murmurs, rubs, gallops or clicks. No pedal edema. Gastrointestinal system: Abdomen is nondistended, soft and nontender. No organomegaly or masses felt. Normal bowel sounds heard. Central nervous system: Alert and oriented. No focal neurological deficits. Extremities: Symmetric 5 x 5 power. Skin: No rashes, lesions or ulcers Psychiatry: Judgement and insight appear normal. Mood & affect appropriate.   Data Reviewed: I have personally reviewed following labs and imaging studies  CBC: Recent Labs  Lab 01/08/20 1226 01/09/20 0308 01/10/20 0601  WBC 4.5 4.0 7.8  NEUTROABS 3.9  --   --   HGB 12.9 11.9* 11.5*  HCT 39.5 37.0 35.8*  MCV 84.6 86.0 87.1  PLT 209 206 502   Basic Metabolic Panel: Recent Labs  Lab 01/07/20 1317 01/08/20 1226 01/09/20 0308 01/10/20 0601  NA 135 134* 141 136  K 5.1 4.3 4.6 4.7  CL 92* 92* 103 98  CO2 37* 30 31 27   GLUCOSE 84 118* 143* 102*  BUN 11 7* 11 13  CREATININE 0.85 0.66 0.59 0.56  CALCIUM 10.4 8.9 8.9 9.1   GFR: Estimated Creatinine Clearance: 57.4 mL/min (by C-Serrano formula based on SCr of 0.56 mg/dL). Liver Function Tests: No results for input(s): AST, ALT, ALKPHOS, BILITOT, PROT, ALBUMIN in the last 168 hours. No results for input(s): LIPASE, AMYLASE in the last 168 hours. No results for input(s): AMMONIA in the last 168 hours. Coagulation Profile: No results for input(s): INR, PROTIME in the last 168 hours. Cardiac Enzymes: No results for input(s): CKTOTAL, CKMB, CKMBINDEX, TROPONINI in the last 168 hours. BNP (last 3 results) No results for input(s):  PROBNP in the last 8760 hours. HbA1C: No results for input(s): HGBA1C in the last 72 hours. CBG: Recent Labs  Lab 01/08/20 0729 01/08/20 1224  GLUCAP 108* 121*   Lipid Profile: No results for input(s): CHOL, HDL, LDLCALC, TRIG, CHOLHDL, LDLDIRECT in the last 72 hours. Thyroid Function Tests: No results for input(s): TSH, T4TOTAL, FREET4, T3FREE, THYROIDAB in the last 72 hours. Anemia Panel: No results for input(s): VITAMINB12, FOLATE, FERRITIN, TIBC, IRON, RETICCTPCT in the last 72 hours. Sepsis Labs: No results for input(s): PROCALCITON, LATICACIDVEN in the last 168 hours.  Recent Results (from the past 240 hour(s))  SARS Coronavirus 2 by RT PCR (hospital order, performed in [facility] hospital lab) Nasopharyngeal Nasopharyngeal Swab     Status: None   Collection Time: 01/08/20 12:26 PM   Specimen: Nasopharyngeal Swab  Result Value Ref Range Status   SARS Coronavirus 2 NEGATIVE NEGATIVE Final    Comment: (NOTE) SARS-CoV-2 target nucleic acids are NOT DETECTED.  The SARS-CoV-2 RNA is generally detectable in upper and lower respiratory specimens  during the acute phase of infection. The lowest concentration of SARS-CoV-2 viral copies this assay can detect is 250 copies / mL. A negative result does not preclude SARS-CoV-2 infection and should not be used as the sole basis for treatment or other patient management decisions.  A negative result may occur with improper specimen collection / handling, submission of specimen other than nasopharyngeal swab, presence of viral mutation(s) within the areas targeted by this assay, and inadequate number of viral copies (<250 copies / mL). A negative result must be combined with clinical observations, patient history, and epidemiological information.  Fact Sheet for Patients:   StrictlyIdeas.no  Fact Sheet for Healthcare Providers: BankingDealers.co.za  This test is not yet approved or   cleared by the [place] FDA and has been authorized for detection and/or diagnosis of SARS-CoV-2 by FDA under an Emergency Use Authorization (EUA).  This EUA will remain in effect (meaning this test can be used) for the duration of the COVID-19 declaration under Section 564(b)(1) of the Act, 21 U.S.C. section 360bbb-3(b)(1), unless the authorization is terminated or revoked sooner.  Performed at [facility], [place] [address], [place], [place] [address]      Radiology Studies: No results found.  Scheduled Meds: . arformoterol  15 mcg Nebulization BID  . aspirin EC  81 mg Oral Daily  . azithromycin  500 mg Oral Daily  . buPROPion  150 mg Oral Daily  . enoxaparin (LOVENOX) injection  40 mg Subcutaneous Q24H  . fluticasone  2 spray Each Nare Daily  . folic acid  1 mg Oral Daily  . gabapentin  100 mg Oral BID  . guaiFENesin  600 mg Oral BID  . linaclotide  290 mcg Oral QAC breakfast  . methylPREDNISolone (SOLU-MEDROL) injection  60 mg Intravenous Q12H  . mirtazapine  15 mg Oral QHS  . nicotine  21 mg Transdermal Daily  . pantoprazole  40 mg Oral Daily  . sertraline  150 mg Oral Daily  . sodium chloride flush  3 mL Intravenous Q12H  . thiamine  100 mg Oral Daily  . umeclidinium bromide  1 puff Inhalation Daily   Continuous Infusions:   LOS: 2 days    Time spent: 25 mins.  Shawna Clamp, MD [facility]   If 7PM-7AM, please contact night-coverage

## 2020-01-10 NOTE — Evaluation (Signed)
Occupational Therapy Evaluation Patient Details Name: Christine Serrano MRN: [id number] DOB: May 20, 1961 Today's Date: 01/10/2020    History of Present Illness 59 year old female with PMH: chronic hypoxic respiratory failure d/t  COPD on 4 L/min O2 at baseline, tobacco use, anxiety/depression who presented to [facility] ED on 7/24 via EMS from home after reports of difficulty breathing and an episode of syncope at home on the commode.  Pt denies a fall.   Clinical Impression   An occupational therapy evaluation was completed on this  59 year old Left handed,  Female, with pertinent past medical history of COPD on 4LPM via [place] at baseline, as well as PMH below. Patient is currently requiring setup and supervision to Minimal assist with ADLs including bathing, toileting, standing at sink for grooming, and LB dressing which is below patient's typical baseline of being Independent. ?During this evaluation, patient was limited by decreased activity tolerance with increased HR to 129 after standing at sink.  Current limitations have the potential to impact patient's safety and independence during functional mobility, as well as performance for ADLs. ?[place] "6-clicks" Daily Activity Inpatient Short Form score of ?20/24 indicates ADL impairment this session. Patient lives with her sister-in-law, who is able to provide 24/7 supervision and light assistance. ?Patient demonstrates good rehab potential, and should benefit from continued skilled occupational therapy services while in acute care to maximize safety, independence and quality of life at home.   ?   Follow Up Recommendations  Other (comment);No OT follow up (Recommend HH PT)    Equipment Recommendations  Other (comment) (Agree with pt request for power Scooter)    Recommendations for Other Services       Precautions / Restrictions Precautions Precautions: Fall Precaution Comments: 4L O2 at baseline; monitor HR and  O2 Restrictions Weight Bearing Restrictions: No      Mobility Bed Mobility Overal bed mobility: Modified Independent Bed Mobility: Supine to Sit     Supine to sit: Modified independent (Device/Increase time)     General bed mobility comments: Increased time  Transfers Overall transfer level: Needs assistance Equipment used: None             General transfer comment: Supervision    Balance   Sitting-balance support: No upper extremity supported Sitting balance-Leahy Scale: Good     Standing balance support: No upper extremity supported Standing balance-Leahy Scale: Good                             ADL either performed or assessed with clinical judgement   ADL Overall ADL's : Needs assistance/impaired Eating/Feeding: Independent   Grooming: Supervision/safety Grooming Details (indicate cue type and reason): Cuing for enery conservation strategies and pacing while performing oral hygiene standing at sink x4 min. Upper Body Bathing: Minimal assistance Upper Body Bathing Details (indicate cue type and reason): Based on general assessment, anticipate at least minimal assist for washing hair. Lower Body Bathing: Supervison/ safety Lower Body Bathing Details (indicate cue type and reason): Based on clinical presentation. Upper Body Dressing : Set up Upper Body Dressing Details (indicate cue type and reason): Sitting EOB to don posterior gown. Lower Body Dressing: Set up Lower Body Dressing Details (indicate cue type and reason): Doff/don socks. Toilet Transfer: Copy Details (indicate cue type and reason): Supervision with pivot to recliner.     Tub/ Shower Transfer: English as a second language teacher Details (indicate cue type and reason): Based on clinical judgment. Functional  mobility during ADLs: Supervision/safety (no AD)       Vision Baseline Vision/History: No visual deficits Patient Visual Report: No change  from baseline Vision Assessment?: No apparent visual deficits                Pertinent Vitals/Pain Pain Assessment: No/denies pain  Vitals:   01/10/20 0500 01/10/20 1153  BP: (!) 147/93   Pulse: 91 (!) 119  Resp: 18   Temp: (!) 97.2 F (36.2 C)   SpO2: 97% 96%   4L O2 via [place]     Hand Dominance Left   Extremity/Trunk Assessment Upper Extremity Assessment Upper Extremity Assessment: Overall WFL for tasks assessed       Cervical / Trunk Assessment Cervical / Trunk Assessment: Normal   Communication Communication Communication: No difficulties   Cognition Arousal/Alertness: Awake/alert Behavior During Therapy: WFL for tasks assessed/performed Overall Cognitive Status: Within Functional Limits for tasks assessed                                 General Comments: pt is plessant and cooperative   General Comments       Exercises     Shoulder Instructions      Home Living Family/patient expects to be discharged to:: Private residence Living Arrangements: Other relatives;Other (Comment) (SIL) Available Help at Discharge: Friend(s);Available PRN/intermittently Type of Home: Mobile home Home Access: Stairs to enter Entrance Stairs-Number of Steps: 2 Entrance Stairs-Rails: None Home Layout: One level     Bathroom Shower/Tub: Occupational psychologist: Handicapped height Bathroom Accessibility: Yes How Accessible: Other (comment) (Pt reports home can access a scooter but not a wheelchair.) Home Equipment: Shower seat - built in;Adaptive equipment;Grab bars - toilet;Grab bars - tub/shower Adaptive Equipment: Reacher;Long-handled sponge Additional Comments: pt reports her sister in law assists prn. she has been working on getting a scooter; recently pt reports decr in distance she has been able to amb, unable to walk to mailbox      Prior Functioning/Environment Level of Independence: Needs assistance    ADL's / Homemaking Assistance  Needed: Reports friend (sister in law) occasionally assist with showers, and consistently assists with hair care due to [place];  SIL able to do IADLs, but is also recovering from work related injuries.   Comments: No AD used        OT Problem List: Decreased activity tolerance;Impaired balance (sitting and/or standing);Decreased knowledge of use of DME or AE      OT Treatment/Interventions: Self-care/ADL training;Therapeutic exercise;Therapeutic activities;Energy conservation;DME and/or AE instruction;Patient/family education    OT Goals(Current goals can be found in the care plan section) Acute Rehab OT Goals Patient Stated Goal: To be able to go to the bathroom without nursing help. To be able to care for horses at home. OT Goal Formulation: With patient Time For Goal Achievement: 01/24/20 Potential to Achieve Goals: Good ADL Goals Pt Will Perform Grooming: with modified independence Pt Will Perform Lower Body Dressing: with modified independence;sit to/from stand;with adaptive equipment  OT Frequency: Min 1X/week   Barriers to D/C: Inaccessible home environment  Pt stated that she needs a power scooter at this point because power WC will not fit in her hallway.                     AM-PAC OT "6 Clicks" Daily Activity     Outcome Measure Help from another person eating meals?: None Help from  another person taking care of personal grooming?: A Little Help from another person toileting, which includes using toliet, bedpan, or urinal?: A Little Help from another person bathing (including washing, rinsing, drying)?: A Little Help from another person to put on and taking off regular upper body clothing?: None Help from another person to put on and taking off regular lower body clothing?: A Little 6 Click Score: 20   End of Session Equipment Utilized During Treatment: Gait belt Nurse Communication: Mobility status  Activity Tolerance: Patient tolerated treatment well;Patient  limited by fatigue Patient left: in chair;with call bell/phone within reach  OT Visit Diagnosis: Dizziness and giddiness (R42)                Time: 1109-1150 OT Time Calculation (min): 41 min Charges:  OT Evaluation $OT Eval Low Complexity: 1 Low OT Treatments $Self Care/Home Management : 8-22 mins $Therapeutic Activity: 8-22 mins  Anderson [place], [place] Office: [phone] 01/10/2020   Julien Girt 01/10/2020, 12:49 PM

## 2020-01-10 NOTE — Progress Notes (Signed)
Initial Nutrition Assessment  DOCUMENTATION CODES:   Underweight  INTERVENTION:  Ensure Enlive po BID, each supplement provides 350 kcal and 20 grams of protein  MVI daily   NUTRITION DIAGNOSIS:   Increased nutrient needs related to chronic illness (COPD exacerbation) as evidenced by estimated needs.    GOAL:   Patient will meet greater than or equal to 90% of their needs    MONITOR:   PO intake, Supplement acceptance, Labs, Weight trends, I & O's  REASON FOR ASSESSMENT:   Consult Assessment of nutrition requirement/status  ASSESSMENT:   Pt admitted with COPD exacerbation. PMH includes chronic hypoxic respiratory failure secondary to COPD on 4 L/min O2 at baseline, anxiety/depression, hx cervical ca, HLD.  Pt unavailable at time of RD visit. Per H&P, pt reported intentional wt gain recently and denied any N/V. Pt is noted to be underweight based on BMI.   PO Intake: 100% x 1 recorded meal  Labs reviewed. Medications: Folvite, Solu-medrol, Protonix, Thiamine, Remeron  NUTRITION - FOCUSED PHYSICAL EXAM:  Unable to perform at this time, will attempt at follow-up.   Diet Order:   Diet Order            Diet regular Room service appropriate? Yes; Fluid consistency: Thin  Diet effective now                 EDUCATION NEEDS:   No education needs have been identified at this time  Skin:  Skin Assessment: Reviewed RN Assessment  Last BM:  7/25  Height:   Ht Readings from Last 1 Encounters:  01/07/20 5' 4.5" (1.638 m)    Weight:   Wt Readings from Last 10 Encounters:  01/10/20 49.2 kg  01/07/20 54.9 kg  12/11/19 48.5 kg  11/10/19 48.8 kg  11/09/19 48.5 kg  10/20/19 50.6 kg  10/18/19 50.8 kg  10/16/19 50.3 kg  10/15/19 50.5 kg  10/13/19 49.9 kg   Of note, question accuracy of wt on 01/07/20.   BMI:  Body mass index is 18.32 kg/m.  Estimated Nutritional Needs:   Kcal:  1500-1700  Protein:  75-85 grams  Fluid:  >/=1.5L/d    Larkin Ina, MS, RD, LDN RD pager number and weekend/on-call pager number located in [place].

## 2020-01-11 MED ORDER — PREDNISONE 10 MG (21) PO TBPK
ORAL_TABLET | ORAL | 0 refills | Status: DC
Start: 2020-01-11 — End: 2020-01-25

## 2020-01-11 MED ORDER — AZITHROMYCIN 250 MG PO TABS: ORAL_TABLET | ORAL | 0 refills | Status: DC

## 2020-01-11 NOTE — Progress Notes (Signed)
Pt requested to have bed alarm turned off. Pt educated on safety protocol and still asks to have bed alarm turned off. RN observed pt able to safely maneuver from bed to [facility].

## 2020-01-11 NOTE — Discharge Summary (Signed)
Physician Discharge Summary  Christine Serrano OEV:[id number] DOB: 10-30-1960 DOA: 01/08/2020  PCP: Ria Bush, MD  Admit date: 01/08/2020   Discharge date: 01/11/2020  Admitted From: Home. Disposition:  Home  Recommendations for Outpatient Follow-up:  1. Follow up with PCP in 1-2 weeks 2. Please obtain BMP/CBC in one week 3. Advised to take Zithromax for 1 more days to complete 5-day course for COPD exacerbation. 4. Advised to continue prednisone taper, take it as directed 5. Advised to continue oxygen as required.  Home Health: No Equipment/Devices: Oxygen,   Discharge Condition: Stable. CODE STATUS:Full code Diet recommendation: Heart Healthy   Brief Summary / Hospital course: This is a 59 year old female with past medical history of chronic hypoxic respiratory failure secondary to COPD on 4 L/min O2 at baseline, tobacco use, anxiety/depression who presented to [facility] ED on 7/24 via EMS from home after reports of difficulty breathing and an episode of syncope this afternoon. Apparently the patient's shortness of breath has been worsening over the past week however she states that yesterday after visiting her horses at the barn she began having increased shortness of breath. She was seen by her PCP who restarted her home Spiriva and Symbicort and prescribed a steroid taper. Unfortunately, the inhalers did not help at home. She was going to get the prescription this morning but syncopized in the bathroom with LOC lasting several seconds, denies head trauma or any other injuries and called EMS. When EMS arrived, she did have increased O2 requirement .she was given 4 mg of mag and 125 mg of Solu-Medrol. Admits to having weight gain intentionally more recently, no leg swelling, no nausea or vomiting, chest pain or palpitations. She has not been vaccinated against COVID-19. She was admitted for COPD exacerbation.  She was continued on IV Solu-Medrol and DuoNeb nebulization.  She was also  continued on Spiriva and Symbicort.  She has slow improvement overall.  She was also given Zithromax for bronchitis.  She has made good improvement, she is back to her baseline oxygen requirement.  She was able to ambulate well without any worsening shortness of breath.  Patient wants to be discharged home.  Advised to follow-up with primary care physician, advised prednisone taper as directed.  Discharge Diagnoses:  Active Problems:   Moderate recurrent major depression (HCC)   Chronic constipation   Alcohol abuse, in remission   COPD, very severe ([place])   Tobacco abuse   Anxiety  She was managed for below problems.  1. COPD exacerbation with chronic hypoxic respiratory failure - Improving. a. Afebrile and hemodynamically stable on baseline 4 L/min. b. COVID-19 negative. c. D-dimer negative. d. Could be from reactive airway disease after visiting her horses prior to worsening of her symptoms yesterday. e. BNP 126. f. Advised to abstain from tobacco - smokes 1 ppd. g. Continue IV steroids, will taper down with clinical improvement. h. Continue azithromycin i. continueAnoro ellipta j. Add Xopenex as needed k. Needs medication optimization at discharge. Has expressed financial barrier to getting certain meds in the past.SW consulted   2. Syncopal episode likely secondary to hyperventilation in setting of COPD exacerbation a. Continue telemetry b. No further work-up for now but continue to closely monitor.  3. Anxiety/depression a. Continue home meds.  4. Tobacco use a. Advised cessation b. Nicotine patch.  5. Alcohol use a. Has a few drinks per week but did not quantify, last drink last night.  b. CIWA protocol  6. Chronic constipation a. Continue home linzess  Discharge Instructions  Discharge Instructions    Call MD for:  difficulty breathing, headache or visual disturbances   Complete by: As directed    Call MD for:  persistant dizziness or light-headedness    Complete by: As directed    Call MD for:  persistant nausea and vomiting   Complete by: As directed    Diet - low sodium heart healthy   Complete by: As directed    Discharge instructions   Complete by: As directed    Advised to follow-up with primary care physician in 1 week. Advised to take Zithromax for 4 more days to complete 5-day course for COPD exacerbation. Advised to continue prednisone taper take it as advised. Advised to continue oxygen as required.   Increase activity slowly   Complete by: As directed      Allergies as of 01/11/2020      Reactions   Terbinafine And Related Diarrhea   Diarrheal illness while on terbinafine ?related to med   Imitrex [sumatriptan] Other (See Comments)   Worsens migraine       Medication List    STOP taking these medications   budesonide 0.5 MG/2ML nebulizer solution Commonly known as: PULMICORT   predniSONE 20 MG tablet Commonly known as: DELTASONE Replaced by: predniSONE 10 MG (21) Tbpk tablet     TAKE these medications   albuterol (2.5 MG/3ML) 0.083% nebulizer solution Commonly known as: PROVENTIL Take 3 mLs (2.5 mg total) by nebulization every 2 (two) hours as needed for wheezing.   arformoterol 15 MCG/2ML Nebu Commonly known as: BROVANA Take 2 mLs (15 mcg total) by nebulization 2 (two) times daily.   aspirin EC 81 MG tablet Take 81 mg by mouth daily.   azithromycin 250 MG tablet Commonly known as: ZITHROMAX Take one tablet daily for 1 days. Start taking on: January 12, 2020   budesonide-formoterol 160-4.5 MCG/ACT inhaler Commonly known as: SYMBICORT Inhale 2 puffs into the lungs 2 (two) times daily.   buPROPion 150 MG 24 hr tablet Commonly known as: Wellbutrin XL Take 1 tablet (150 mg total) by mouth daily.   Calcium Ascorbate 500 MG Tabs Take 1 tablet (500 mg total) by mouth daily.   cholecalciferol 25 MCG (1000 UNIT) tablet Commonly known as: VITAMIN D3 Take 1,000 Units by mouth daily.   fluticasone 50  MCG/ACT nasal spray Commonly known as: FLONASE Place 2 sprays into both nostrils daily. What changed:   when to take this  reasons to take this   folic acid 1 MG tablet Commonly known as: FOLVITE Take 1 tablet (1 mg total) by mouth daily.   gabapentin 100 MG capsule Commonly known as: NEURONTIN Take 1 capsule (100 mg total) by mouth 2 (two) times daily.   guaiFENesin 600 MG 12 hr tablet Commonly known as: MUCINEX Take 1 tablet (600 mg total) by mouth 2 (two) times daily.   hydrOXYzine 25 MG tablet Commonly known as: ATARAX/VISTARIL TAKE 1 TABLET (25 MG TOTAL) BY MOUTH 2 (TWO) TIMES DAILY AS NEEDED FOR ANXIETY. What changed: See the new instructions.   Linzess 290 MCG Caps capsule Generic drug: linaclotide Take 1 capsule (290 mcg total) by mouth daily as needed (constipation). What changed: when to take this   meclizine 25 MG tablet Commonly known as: ANTIVERT Take 1 tablet (25 mg total) by mouth 3 (three) times daily as needed for dizziness.   methocarbamol 500 MG tablet Commonly known as: Robaxin Take 1 tablet (500 mg total) by mouth 3 (three) times daily as needed  for muscle spasms (sedation precautions).   mirtazapine 15 MG tablet Commonly known as: REMERON TAKE 1 TABLET BY MOUTH EVERYDAY AT BEDTIME What changed: See the new instructions.   multivitamin with minerals Tabs tablet Take 1 tablet by mouth daily.   naproxen 375 MG tablet Commonly known as: NAPROSYN Take 1 tablet (375 mg total) by mouth 2 (two) times daily as needed for moderate pain.   omeprazole 40 MG capsule Commonly known as: PRILOSEC TAKE 1 CAPSULE BY MOUTH DAILY. FOR THREE WEEKS THEN AS NEEDED. TAKE 30 MIN PRIOR TO LARGE MEAL What changed: See the new instructions.   OXYGEN Inhale 3 L/min into the lungs continuous.   predniSONE 10 MG (21) Tbpk tablet Commonly known as: STERAPRED UNI-PAK 21 TAB Take it as directed. Replaces: predniSONE 20 MG tablet   sertraline 100 MG tablet Commonly  known as: ZOLOFT Take 1.5 tablets (150 mg total) by mouth daily.   Spiriva HandiHaler 18 MCG inhalation capsule Generic drug: tiotropium Place 1 capsule (18 mcg total) into inhaler and inhale daily.   thiamine 100 MG tablet Take 1 tablet (100 mg total) by mouth daily.   vitamin C with rose hips 500 MG tablet Take 500 mg by mouth daily.       Follow-up Information    Ria Bush, MD Follow up in 1 week(s).   Specialty: Family Medicine Contact information: [place] [address] [phone]              Allergies  Allergen Reactions  . Terbinafine And Related Diarrhea    Diarrheal illness while on terbinafine ?related to med  . Imitrex [Sumatriptan] Other (See Comments)    Worsens migraine     Consultations:  None.   Procedures/Studies: DG Chest 2 View  Result Date: 01/08/2020 CLINICAL DATA:  Short of breath.  History of COPD. EXAM: CHEST - 2 VIEW COMPARISON:  12/10/2019 FINDINGS: The heart size is normal. Prominence of the pulmonary arteries noted bilaterally suggestive of PA hypertension. No pleural effusion or edema identified. The lungs are hyperinflated with interstitial changes of emphysema. Bilateral upper lobe scarring and architectural distortion with upward retraction of the hilar structures noted, unchanged. No superimposed pleural effusion, pulmonary edema or airspace consolidation. IMPRESSION: 1. No active cardiopulmonary abnormalities. 2. Emphysema with chronic bilateral upper lobe scarring. Electronically Signed   By: Kerby Moors M.D.   On: 01/08/2020 08:08    None.   Subjective: Patient was seen and examined at bedside.  No overnight events.  She reports feeling much better,  wheezing has significantly improved.  Patient wants to go home.  She has home oxygen at home.  She is on 3 L supplemental oxygen saturating 96%.  Discharge Exam: Vitals:   01/11/20 0451 01/11/20 0818  BP: (!) 150/88   Pulse: 84   Resp: 20    Temp: (!) 97.4 F (36.3 C)   SpO2: 100% 96%   Vitals:   01/10/20 2008 01/11/20 0451 01/11/20 0500 01/11/20 0818  BP: (!) 149/93 (!) 150/88    Pulse: 101 84    Resp: 20 20    Temp: 97.8 F (36.6 C) (!) 97.4 F (36.3 C)    TempSrc: Oral Oral    SpO2: 99% 100%  96%  Weight:   54.4 kg   Height:        General: Pt is alert, awake, not in acute distress Cardiovascular: RRR, S1/S2 +, no rubs, no gallops Respiratory: CTA bilaterally, no wheezing, no rhonchi Abdominal:  Soft, NT, ND, bowel sounds + Extremities: no edema, no cyanosis    The results of significant diagnostics from this hospitalization (including imaging, microbiology, ancillary and laboratory) are listed below for reference.     Microbiology: Recent Results (from the past 240 hour(s))  SARS Coronavirus 2 by RT PCR (hospital order, performed in [facility] hospital lab) Nasopharyngeal Nasopharyngeal Swab     Status: None   Collection Time: 01/08/20 12:26 PM   Specimen: Nasopharyngeal Swab  Result Value Ref Range Status   SARS Coronavirus 2 NEGATIVE NEGATIVE Final    Comment: (NOTE) SARS-CoV-2 target nucleic acids are NOT DETECTED.  The SARS-CoV-2 RNA is generally detectable in upper and lower respiratory specimens during the acute phase of infection. The lowest concentration of SARS-CoV-2 viral copies this assay can detect is 250 copies / mL. A negative result does not preclude SARS-CoV-2 infection and should not be used as the sole basis for treatment or other patient management decisions.  A negative result may occur with improper specimen collection / handling, submission of specimen other than nasopharyngeal swab, presence of viral mutation(s) within the areas targeted by this assay, and inadequate number of viral copies (<250 copies / mL). A negative result must be combined with clinical observations, patient history, and epidemiological information.  Fact Sheet for Patients:    StrictlyIdeas.no  Fact Sheet for Healthcare Providers: BankingDealers.co.za  This test is not yet approved or  cleared by the [place] FDA and has been authorized for detection and/or diagnosis of SARS-CoV-2 by FDA under an Emergency Use Authorization (EUA).  This EUA will remain in effect (meaning this test can be used) for the duration of the COVID-19 declaration under Section 564(b)(1) of the Act, 21 U.S.C. section 360bbb-3(b)(1), unless the authorization is terminated or revoked sooner.  Performed at [facility], [place] [address] [place], [place] [address]      Labs: BNP (last 3 results) Recent Labs    01/08/20 1226  BNP 62.2   Basic Metabolic Panel: Recent Labs  Lab 01/07/20 1317 01/08/20 1226 01/09/20 0308 01/10/20 0601  NA 135 134* 141 136  K 5.1 4.3 4.6 4.7  CL 92* 92* 103 98  CO2 37* 30 31 27   GLUCOSE 84 118* 143* 102*  BUN 11 7* 11 13  CREATININE 0.85 0.66 0.59 0.56  CALCIUM 10.4 8.9 8.9 9.1   Liver Function Tests: No results for input(s): AST, ALT, ALKPHOS, BILITOT, PROT, ALBUMIN in the last 168 hours. No results for input(s): LIPASE, AMYLASE in the last 168 hours. No results for input(s): AMMONIA in the last 168 hours. CBC: Recent Labs  Lab 01/08/20 1226 01/09/20 0308 01/10/20 0601  WBC 4.5 4.0 7.8  NEUTROABS 3.9  --   --   HGB 12.9 11.9* 11.5*  HCT 39.5 37.0 35.8*  MCV 84.6 86.0 87.1  PLT 209 206 207   Cardiac Enzymes: No results for input(s): CKTOTAL, CKMB, CKMBINDEX, TROPONINI in the last 168 hours. BNP: Invalid input(s): POCBNP CBG: Recent Labs  Lab 01/08/20 0729 01/08/20 1224  GLUCAP 108* 121*   D-Dimer Recent Labs    01/08/20 1226  DDIMER 0.50   Hgb A1c No results for input(s): HGBA1C in the last 72 hours. Lipid Profile No results for input(s): CHOL, HDL, LDLCALC, TRIG, CHOLHDL, LDLDIRECT in the last 72 hours. Thyroid function studies No results for  input(s): TSH, T4TOTAL, T3FREE, THYROIDAB in the last 72 hours.  Invalid input(s): FREET3 Anemia work up No results for input(s): VITAMINB12, FOLATE,  FERRITIN, TIBC, IRON, RETICCTPCT in the last 72 hours. Urinalysis    Component Value Date/Time   COLORURINE STRAW (A) 09/11/2019 0224   APPEARANCEUR CLEAR 09/11/2019 0224   LABSPEC 1.002 (L) 09/11/2019 0224   PHURINE 6.0 09/11/2019 0224   GLUCOSEU NEGATIVE 09/11/2019 0224   HGBUR SMALL (A) 09/11/2019 0224   BILIRUBINUR NEGATIVE 09/11/2019 0224   KETONESUR NEGATIVE 09/11/2019 0224   PROTEINUR NEGATIVE 09/11/2019 0224   NITRITE NEGATIVE 09/11/2019 0224   LEUKOCYTESUR NEGATIVE 09/11/2019 0224   Sepsis Labs Invalid input(s): PROCALCITONIN,  WBC,  LACTICIDVEN Microbiology Recent Results (from the past 240 hour(s))  SARS Coronavirus 2 by RT PCR (hospital order, performed in [facility] hospital lab) Nasopharyngeal Nasopharyngeal Swab     Status: None   Collection Time: 01/08/20 12:26 PM   Specimen: Nasopharyngeal Swab  Result Value Ref Range Status   SARS Coronavirus 2 NEGATIVE NEGATIVE Final    Comment: (NOTE) SARS-CoV-2 target nucleic acids are NOT DETECTED.  The SARS-CoV-2 RNA is generally detectable in upper and lower respiratory specimens during the acute phase of infection. The lowest concentration of SARS-CoV-2 viral copies this assay can detect is 250 copies / mL. A negative result does not preclude SARS-CoV-2 infection and should not be used as the sole basis for treatment or other patient management decisions.  A negative result may occur with improper specimen collection / handling, submission of specimen other than nasopharyngeal swab, presence of viral mutation(s) within the areas targeted by this assay, and inadequate number of viral copies (<250 copies / mL). A negative result must be combined with clinical observations, patient history, and epidemiological information.  Fact Sheet for Patients:    StrictlyIdeas.no  Fact Sheet for Healthcare Providers: BankingDealers.co.za  This test is not yet approved or  cleared by the [place] FDA and has been authorized for detection and/or diagnosis of SARS-CoV-2 by FDA under an Emergency Use Authorization (EUA).  This EUA will remain in effect (meaning this test can be used) for the duration of the COVID-19 declaration under Section 564(b)(1) of the Act, 21 U.S.C. section 360bbb-3(b)(1), unless the authorization is terminated or revoked sooner.  Performed at [facility], [place] [address], [place], [place] [address]      Time coordinating discharge: Over 30 minutes  SIGNED:   Shawna Clamp, MD  [facility] 01/11/2020, 11:08 AM Pager   If 7PM-7AM, please contact night-coverage [email or web address]

## 2020-01-11 NOTE — Progress Notes (Signed)
Physical Therapy Treatment Patient Details Name: Christine Serrano MRN: [id number] DOB: August 30, 1960 Today's Date: 01/11/2020    History of Present Illness 59 year old female with PMH: chronic hypoxic respiratory failure d/t  COPD on 4 L/min O2 at baseline, tobacco use, anxiety/depression who presented to [facility] ED on 7/24 via EMS from home after reports of difficulty breathing and an episode of syncope at home on the commode.  Pt denies a fall.    PT Comments    The patient is pleased to be up ambulating in room. Plans DC home today. SPO2 100 % on 4 L, HR 104.   Follow Up Recommendations  Home health PT     Equipment Recommendations  None recommended by PT    Recommendations for Other Services       Precautions / Restrictions Precautions Precautions: Fall Precaution Comments: 4L O2 at baseline; monitor HR and O2    Mobility  Bed Mobility Overal bed mobility: Modified Independent Bed Mobility: Supine to Sit     Supine to sit: Modified independent (Device/Increase time)     General bed mobility comments: Increased time  Transfers Overall transfer level: Needs assistance Equipment used: None             General transfer comment: Supervision  Ambulation/Gait Ambulation/Gait assistance: Modified independent (Device/Increase time) Gait Distance (Feet): 25 Feet Assistive device: None Gait Pattern/deviations: Step-through pattern         Stairs             Wheelchair Mobility    Modified Rankin (Stroke Patients Only)       Balance Overall balance assessment: Mild deficits observed, not formally tested Sitting-balance support: No upper extremity supported Sitting balance-Leahy Scale: Good     Standing balance support: No upper extremity supported Standing balance-Leahy Scale: Good                              Cognition Arousal/Alertness: Awake/alert Behavior During Therapy: WFL for tasks assessed/performed Overall Cognitive Status:  Within Functional Limits for tasks assessed                                        Exercises      General Comments        Pertinent Vitals/Pain Pain Assessment: No/denies pain    Home Living                      Prior Function            PT Goals (current goals can now be found in the care plan section) Progress towards PT goals: Progressing toward goals    Frequency    Min 3X/week      PT Plan Current plan remains appropriate    Co-evaluation              AM-PAC PT "6 Clicks" Mobility   Outcome Measure  Help needed turning from your back to your side while in a flat bed without using bedrails?: None Help needed moving from lying on your back to sitting on the side of a flat bed without using bedrails?: None Help needed moving to and from a bed to a chair (including a wheelchair)?: A Little Help needed standing up from a chair using your arms (e.g., wheelchair or bedside chair)?: A Little Help needed to walk  in hospital room?: A Little Help needed climbing 3-5 steps with a railing? : A Lot 6 Click Score: 19    End of Session Equipment Utilized During Treatment: Oxygen Activity Tolerance: Patient tolerated treatment well Patient left: in bed;with call bell/phone within reach Nurse Communication: Mobility status PT Visit Diagnosis: Difficulty in walking, not elsewhere classified (R26.2)     Time: 9539-6728 PT Time Calculation (min) (ACUTE ONLY): 10 min  Charges:  $Gait Training: 8-22 mins                     [place] Pager [phone] Office [phone]    [name] 01/11/2020, 1:13 PM

## 2020-01-11 NOTE — Care Management Important Message (Signed)
Important Message  Patient Details IM Letter presented to the Patient Name: Christine Serrano MRN: [id number] Date of Birth: Aug 16, 1960   Medicare Important Message Given:  Yes     Kerin Salen 01/11/2020, 1:29 PM

## 2020-01-11 NOTE — Discharge Instructions (Signed)
Advised to follow-up with primary care physician in 1 week. Advised to take Zithromax for 4 more days to complete 5-day course for COPD exacerbation. Advised to continue prednisone taper take it as advised. Advised to continue oxygen as required.

## 2020-01-11 NOTE — Consult Note (Signed)
   [facility] CM Inpatient Consult   01/11/2020  Christine Serrano 1960/07/01 [id number]   Patient chart has been reviewed for readmissions less than 30 days and for high risk score for unplanned readmissions.  Patient assessed for community [place] Management follow up needs.  Attempted to speak with patient by telephone to introduce [place] outpatient services for assistance with chronic disease management. No answer received with call. Per chart review, patient disposition is for home. Referral placed for [facility] CM follow up for assessment of community needs.  Of note, [facility] Care Management services does not replace or interfere with any services that are arranged by inpatient case management or social work.  Netta Cedars, MSN, [place] [facility] Liaison Nurse Mobile Phone [phone]  Toll free office [phone]

## 2020-01-11 NOTE — Progress Notes (Signed)
Pt to be discharged to home this afternoon. Discharge instructions including all Medications and schedules for these Medications reviewed with the Pt. Pt verbalized understanding of all discharge instructions. Discharge packet with the Pt at time of discharge.

## 2020-01-12 ENCOUNTER — Other Ambulatory Visit: Payer: Self-pay

## 2020-01-12 ENCOUNTER — Telehealth: Payer: Self-pay

## 2020-01-12 NOTE — Telephone Encounter (Signed)
1st attempt- Left message on voicemail to return my call. Need to complete TCM and schedule follow up visit.  

## 2020-01-12 NOTE — Telephone Encounter (Signed)
2nd/final attempt- Left message on voicemail to return my call. Need to complete TCM and schedule follow up visit.

## 2020-01-12 NOTE — Patient Outreach (Signed)
[place] [facility]) Care Management  01/12/2020  SERA HITSMAN 1960/11/27 [id number]     Transition of Care Referral  Referral Date: 01/12/2020 Referral Source: [place] Date of Discharge: 01/11/2020 Facility: [facility] Medicare PCP Office Does [facility]   Outreach attempt # 1 to patient. No answer. RN CM left HIPAA compliant voicemail message along with contact info.   Plan: RN CM will make outreach attempt to patient within 3-4 business days. RN CM will send unsuccessful outreach letter to patient.   [name], RN,BSN,CCM [place] Management Telephonic Care Management Coordinator Direct Phone: [phone] Toll Free: [phone] Fax: [phone]

## 2020-01-13 ENCOUNTER — Other Ambulatory Visit: Payer: Self-pay

## 2020-01-13 NOTE — Patient Outreach (Signed)
[place] [facility]) Care Management  01/13/2020  Christine Serrano Jan 13, 1961 [id number]   Transition of Care Referral  Referral Date: 01/12/2020 Referral Source: [place] Date of Discharge: 01/11/2020 Facility: [facility] Medicare PCP Office Does [facility]   Outreach attempt #2 to patient. No answer at present.     Plan: RN CM will make outreach attempt to patient within 3-4 business days.  [name], RN,BSN,CCM [place] Management Telephonic Care Management Coordinator Direct Phone: [phone] Toll Free: [phone] Fax: [phone]

## 2020-01-14 ENCOUNTER — Telehealth: Payer: Self-pay | Admitting: Adult Health Nurse Practitioner

## 2020-01-14 ENCOUNTER — Other Ambulatory Visit: Payer: Medicare HMO

## 2020-01-14 NOTE — Telephone Encounter (Signed)
Ca is normal and CrCl is normal at 90.78mL/min. [place] for pt to have inj.

## 2020-01-14 NOTE — Telephone Encounter (Signed)
Called to check on patient's low blood pressures.  She was in hospital for COPD exacerbation and was started on prednisone taper which has increased her BP.  Have encouraged her to continue monitoring her BP at home.  Scheduled visit for 01/20/20 @ 12pm Sotirios Navarro K. Olena Heckle NP

## 2020-01-14 NOTE — Patient Outreach (Signed)
[facility]) Care Management  01/14/2020  Christine Serrano 07/08/1960 [id number]   Transition of Care Referral  Referral Date: 01/12/2020 Referral Source: [place] Date of Discharge: 01/11/2020 Facility: [facility] Medicare PCP Office Does [facility]    Unsuccessful outreach attempt # 3 to patient.   Plan: RN CM will make outreach attempt to patient within 3-4 weeks if no response from letter mailed to patient.   [name], RN,BSN,CCM [facility] Management Telephonic Care Management Coordinator Direct Phone: [phone] Toll Free: [phone] Fax: [phone]

## 2020-01-19 NOTE — Telephone Encounter (Signed)
Pt is good to go, Prolia in fridge and labeled.

## 2020-01-20 ENCOUNTER — Other Ambulatory Visit: Payer: Self-pay | Admitting: Adult Health Nurse Practitioner

## 2020-01-20 ENCOUNTER — Other Ambulatory Visit: Payer: Self-pay

## 2020-01-20 DIAGNOSIS — Z9981 Dependence on supplemental oxygen: Secondary | ICD-10-CM | POA: Diagnosis not present

## 2020-01-20 DIAGNOSIS — J9611 Chronic respiratory failure with hypoxia: Secondary | ICD-10-CM | POA: Diagnosis not present

## 2020-01-20 DIAGNOSIS — Z515 Encounter for palliative care: Secondary | ICD-10-CM

## 2020-01-21 NOTE — Progress Notes (Signed)
[facility] Consult Note Telephone: [phone]  Fax: [phone]  PATIENT NAME: Christine Serrano DOB: 1961-05-23 MRN: [id number]  PRIMARY CARE PROVIDER:   Ria Bush, MD  REFERRING PROVIDER:  Ria Bush, MD [place],  [place] [address]  RESPONSIBLE PARTY:   Self [phone] [name] SO/HCPOA [phone]    RECOMMENDATIONS and PLAN:  1.  Advanced care planning.  Patient is DNR  2.  Severe COPD. Patient was hospitalized 7/24 through 01/11/2020 for COPD exacerbation. O2 dependent at 4 L and will increase to 5 L with activity. She does sleep with head of the bed elevated and with two pillows, this is baseline. She is independent of ADLs but has to take frequent rest breaks due to dyspnea. Is working on getting a scooter so that she can go places outside of the home.  Has had issues with medical supply companies not wanting to work with insurance for the scooters.  Have suggested calling [facility] and gave contact number for this.  Patient cannot afford nebulizer treatments that have been prescribed for her and these have been changed to inhalers.  Patient is finishing up a prednisone taper and states that she is feeling better than she has in a long time.  Patient does have questions about her portable oxygen as [facility] is no longer supplying these supplies for her.  Have suggested reaching out to her pulmonologist for new referral for her oxygen supplies at [facility].  Patient states that she has appointment with them next week and will inquire.  3.  Depression.  Patient's depression seems to be well controlled on Wellbutrin 150 mg daily and Remeron 15 mg nightly.  Her appetite continues to be good and current weight is 120 pounds.  Continue current medications for depression  Other than coughing more patient states that she is feeling pretty good.  Denies fever, headaches, pain, dizziness, N/V/D,  constipation.  Palliative will continue to monitor for symptom management/decline and make recommendations as needed.  Have appointment in 4 weeks.  Encouraged to call with any questions or concerns   I spent 80 minutes providing this consultation,  from 12:00 to 1:20 including time spent with patient/family, chart review, chart review, documentation. More than 50% of the time in this consultation was spent coordinating communication.   HISTORY OF PRESENT ILLNESS:  Christine Serrano is a 59 y.o. year old female with multiple medical problems including severe COPD, HLD, depression. Palliative Care was asked to help address goals of care.   CODE STATUS: DNR  PPS: 40% HOSPICE ELIGIBILITY/DIAGNOSIS: TBD  PHYSICAL EXAM:  BP 126/72  HR 113  O2 96% on 4L General: NAD, frail appearing, thin Cardiovascular:tachycardic;regular rate and rhythm Pulmonary:lung sounds diminished with poor air movement with wheezing heard throughout; normal respiratory effort Abdomen: soft, nontender, + bowel sounds GU: no suprapubic tenderness Extremities: no edema, no joint deformities Skin: no rasheson exposed skin Neurological: Weakness but otherwise nonfocal; is having more forgetfulnes  PAST MEDICAL HISTORY:  Past Medical History:  Diagnosis Date  . Cervical cancer ([place])   . Chronic idiopathic constipation   . Community acquired pneumonia of right upper lobe of lung 12/10/2016  . COPD (chronic obstructive pulmonary disease) ([place])   . Depression   . Endometriosis   . HLD (hyperlipidemia)   . Smoker     SOCIAL HX:  Social History   Tobacco Use  . Smoking status: Current Every Day Smoker  Packs/day: 3.00    Years: 50.00    Pack years: 150.00    Types: Cigarettes  . Smokeless tobacco: Never Used  . Tobacco comment: 10/18/19 pt somkes 2 packs per day [facility]  Substance Use Topics  . Alcohol use: Yes    Comment: occasional    ALLERGIES:  Allergies  Allergen Reactions  . Terbinafine And Related  Diarrhea    Diarrheal illness while on terbinafine ?related to med  . Imitrex [Sumatriptan] Other (See Comments)    Worsens migraine      PERTINENT MEDICATIONS:  Outpatient Encounter Medications as of 01/20/2020  Medication Sig  . Ascorbic Acid (VITAMIN C WITH ROSE HIPS) 500 MG tablet Take 500 mg by mouth daily.  Marland Kitchen aspirin EC 81 MG tablet Take 81 mg by mouth daily.  Marland Kitchen azithromycin (ZITHROMAX) 250 MG tablet Take one tablet daily for 1 days.  . budesonide-formoterol (SYMBICORT) 160-4.5 MCG/ACT inhaler Inhale 2 puffs into the lungs 2 (two) times daily.  Marland Kitchen buPROPion (WELLBUTRIN XL) 150 MG 24 hr tablet Take 1 tablet (150 mg total) by mouth daily.  . Calcium Ascorbate 500 MG TABS Take 1 tablet (500 mg total) by mouth daily.  . cholecalciferol (VITAMIN D3) 25 MCG (1000 UNIT) tablet Take 1,000 Units by mouth daily.  . fluticasone (FLONASE) 50 MCG/ACT nasal spray Place 2 sprays into both nostrils daily. (Patient taking differently: Place 2 sprays into both nostrils daily as needed for allergies or rhinitis. )  . folic acid (FOLVITE) 1 MG tablet Take 1 tablet (1 mg total) by mouth daily.  Marland Kitchen gabapentin (NEURONTIN) 100 MG capsule Take 1 capsule (100 mg total) by mouth 2 (two) times daily.  . hydrOXYzine (ATARAX/VISTARIL) 25 MG tablet TAKE 1 TABLET (25 MG TOTAL) BY MOUTH 2 (TWO) TIMES DAILY AS NEEDED FOR ANXIETY. (Patient taking differently: Take 25 mg by mouth 2 (two) times daily as needed for anxiety. )  . LINZESS 290 MCG CAPS capsule Take 1 capsule (290 mcg total) by mouth daily as needed (constipation). (Patient taking differently: Take 290 mcg by mouth daily before breakfast. )  . meclizine (ANTIVERT) 25 MG tablet Take 1 tablet (25 mg total) by mouth 3 (three) times daily as needed for dizziness.  . methocarbamol (ROBAXIN) 500 MG tablet Take 1 tablet (500 mg total) by mouth 3 (three) times daily as needed for muscle spasms (sedation precautions). (Patient not taking: Reported on 01/08/2020)  . mirtazapine  (REMERON) 15 MG tablet TAKE 1 TABLET BY MOUTH EVERYDAY AT BEDTIME (Patient taking differently: Take 15 mg by mouth at bedtime. )  . Multiple Vitamin (MULTIVITAMIN WITH MINERALS) TABS tablet Take 1 tablet by mouth daily.  . naproxen (NAPROSYN) 375 MG tablet Take 1 tablet (375 mg total) by mouth 2 (two) times daily as needed for moderate pain.  Marland Kitchen omeprazole (PRILOSEC) 40 MG capsule TAKE 1 CAPSULE BY MOUTH DAILY. FOR THREE WEEKS THEN AS NEEDED. TAKE 30 MIN PRIOR TO LARGE MEAL (Patient taking differently: Take 40 mg by mouth daily. )  . OXYGEN Inhale 3 L/min into the lungs continuous.  . predniSONE (STERAPRED UNI-PAK 21 TAB) 10 MG (21) TBPK tablet Take it as directed.  . sertraline (ZOLOFT) 100 MG tablet Take 1.5 tablets (150 mg total) by mouth daily.  Marland Kitchen thiamine 100 MG tablet Take 1 tablet (100 mg total) by mouth daily.  Marland Kitchen tiotropium (SPIRIVA HANDIHALER) 18 MCG inhalation capsule Place 1 capsule (18 mcg total) into inhaler and inhale daily.   No facility-administered encounter medications on file  as of 01/20/2020.     Tekesha Almgren Jenetta Downer, NP

## 2020-01-24 ENCOUNTER — Ambulatory Visit: Payer: Medicare HMO | Admitting: Family Medicine

## 2020-01-25 ENCOUNTER — Encounter: Payer: Self-pay | Admitting: Adult Health

## 2020-01-25 ENCOUNTER — Other Ambulatory Visit: Payer: Self-pay

## 2020-01-25 ENCOUNTER — Ambulatory Visit (INDEPENDENT_AMBULATORY_CARE_PROVIDER_SITE_OTHER): Payer: Medicare HMO | Admitting: Adult Health

## 2020-01-25 DIAGNOSIS — Z720 Tobacco use: Secondary | ICD-10-CM

## 2020-01-25 DIAGNOSIS — J9611 Chronic respiratory failure with hypoxia: Secondary | ICD-10-CM

## 2020-01-25 DIAGNOSIS — J441 Chronic obstructive pulmonary disease with (acute) exacerbation: Secondary | ICD-10-CM

## 2020-01-25 DIAGNOSIS — Z9981 Dependence on supplemental oxygen: Secondary | ICD-10-CM

## 2020-01-25 DIAGNOSIS — J449 Chronic obstructive pulmonary disease, unspecified: Secondary | ICD-10-CM

## 2020-01-25 NOTE — Addendum Note (Signed)
Addended by: Satira Sark D on: 01/25/2020 03:10 PM   Modules accepted: Orders

## 2020-01-25 NOTE — Progress Notes (Signed)
Virtual Visit via Telephone Note  I connected with Christine Serrano on 01/25/20 at  2:00 PM EDT by telephone and verified that I am speaking with the correct person using two identifiers.  Location: Patient: Office  Provider: Home    I discussed the limitations, risks, security and privacy concerns of performing an evaluation and management service by telephone and the availability of in person appointments. I also discussed with the patient that there may be a patient responsible charge related to this service. The patient expressed understanding and agreed to proceed.   History of Present Illness: 59 year old female active heavy smoker followed for very severe COPD and chronic respiratory failure on oxygen at 4 L.  Patient is prone to recurrent exacerbations  Today's televisit is a post hospital follow-up.  Patient has been hospitalized twice over the last 2 months.  Most recent for COPD exacerbation.  She was treated with antibiotics steroids and nebulized bronchodilators.  She is feeling better since discharge.  Patient continues to smoke heavily about 2 packs of cigarettes a day..  Smoking cessation encouraged.  Patient remains on Symbicort and Spiriva. Remains on Oxygen 4l/m .  Patient is followed by palliative care.  She is requesting PT at home can not do pulmonary rehab.  She has low activity tolerance.  Short of breath of minimal activity and is quite sedentary.  Has generalized weakness and low strength.   Observations/Objective: CT chest 03/13/16:Severe emphysema with extensive disease in the upper lobes and superior segment of the right lower lobe. This could represent acute and/or chronic changes. In addition, there is an irregular spiculated density along the right minor fissure which is indeterminate. PFTs 01/15/18: FVC: 1.42 L (48 %pred), FEV1: 0.76 L (31 %pred), FEV1/FVC: 53% , TLC: 4.49 L (94 %pred), DLCO 30 %pred, DLCO/VA 51%. TLC likely underestimated by gas dilution  technique. Severe Gas trappingwith RV at 172% predicted. CT chest 10/13/2019: Severe emphysema particularly upper lobes with upper lobe predominant scarring and architectural distortion no significant change from November 25, 2016 CT. Coronary artery calcifications and aortic calcification was noted. Chest x-ray 10/16/2019:Hyperinflated lungs with diffuse interstitial prominence, no acute infiltrate.  Assessment and Plan: Frequent COPD exacerbations with 2 recent hospitalizations.  Patient continues to smoke 2 packs of cigarettes a day and is on high flow oxygen at 4 L.  Patient education on need for smoking cessation.  And the dangers of smoking and oxygen use. Continue with palliative care at home.  Add order for physical therapy at home.  Chronic respiratory failure on oxygen 4 L.  No increased oxygen demands.  Plan  Patient Instructions  Continue on Symbicort and Spiriva .  Can use Albuterol Neb 3-4 times a day as needed.  Continue on Oxygen 4l/m  Work on cutting back on smoking . Please do not smoke around smoking , do not smoke in the home.  Continue with Palliative care .  Order for PT at home , evaluation and treatment plan .  Follow up with Dr. Patsey Berthold in office in 4-6 weeks and As needed   Please contact office for sooner follow up if symptoms do not improve or worsen or seek emergency care    '   Follow Up Instructions: Follow-up in 4 weeks and as needed   I discussed the assessment and treatment plan with the patient. The patient was provided an opportunity to ask questions and all were answered. The patient agreed with the plan and demonstrated an understanding of the instructions.  The patient was advised to call back or seek an in-person evaluation if the symptoms worsen or if the condition fails to improve as anticipated.  I provided 25  minutes of non-face-to-face time during this encounter.   Rexene Edison, NP

## 2020-01-25 NOTE — Patient Instructions (Addendum)
Continue on Symbicort and Spiriva .  Can use Albuterol Neb 3-4 times a day as needed.  Continue on Oxygen 4l/m  Work on cutting back on smoking . Please do not smoke around smoking , do not smoke in the home.  Continue with Palliative care .  Order for PT at home , evaluation and treatment plan .  Follow up with Dr. Patsey Berthold in office in 4-6 weeks and As needed   Please contact office for sooner follow up if symptoms do not improve or worsen or seek emergency care

## 2020-01-26 NOTE — Progress Notes (Signed)
Agree with the details of the visit as per Rexene Edison, NP  C. Derrill Kay, MD [facility] PCCM

## 2020-01-28 ENCOUNTER — Ambulatory Visit (INDEPENDENT_AMBULATORY_CARE_PROVIDER_SITE_OTHER)
Admission: RE | Admit: 2020-01-28 | Discharge: 2020-01-28 | Disposition: A | Discharge status: Home / Self Care | Payer: Medicare HMO | Source: Ambulatory Visit | Attending: Family Medicine | Admitting: Family Medicine

## 2020-01-28 ENCOUNTER — Ambulatory Visit (INDEPENDENT_AMBULATORY_CARE_PROVIDER_SITE_OTHER): Payer: Medicare HMO | Admitting: Family Medicine

## 2020-01-28 ENCOUNTER — Other Ambulatory Visit: Payer: Self-pay

## 2020-01-28 ENCOUNTER — Encounter: Payer: Self-pay | Admitting: Family Medicine

## 2020-01-28 VITALS — BP 122/70 | HR 122 | Temp 98.0°F | Ht 64.5 in | Wt 118.0 lb

## 2020-01-28 DIAGNOSIS — M5090 Cervical disc disorder, unspecified, unspecified cervical region: Secondary | ICD-10-CM

## 2020-01-28 DIAGNOSIS — Z9981 Dependence on supplemental oxygen: Secondary | ICD-10-CM | POA: Diagnosis not present

## 2020-01-28 DIAGNOSIS — Z720 Tobacco use: Secondary | ICD-10-CM | POA: Diagnosis not present

## 2020-01-28 DIAGNOSIS — R000 Tachycardia, unspecified: Secondary | ICD-10-CM | POA: Diagnosis not present

## 2020-01-28 DIAGNOSIS — J449 Chronic obstructive pulmonary disease, unspecified: Secondary | ICD-10-CM

## 2020-01-28 DIAGNOSIS — J9611 Chronic respiratory failure with hypoxia: Secondary | ICD-10-CM | POA: Diagnosis not present

## 2020-01-28 DIAGNOSIS — M542 Cervicalgia: Secondary | ICD-10-CM | POA: Diagnosis not present

## 2020-01-28 DIAGNOSIS — M810 Age-related osteoporosis without current pathological fracture: Secondary | ICD-10-CM | POA: Diagnosis not present

## 2020-01-28 MED ORDER — METHOCARBAMOL 500 MG PO TABS: 500.0000 mg | ORAL_TABLET | Freq: Three times a day (TID) | ORAL | 0 refills | Status: DC | PRN

## 2020-01-28 MED ORDER — DENOSUMAB 60 MG/ML ~~LOC~~ SOSY
60.0000 mg | PREFILLED_SYRINGE | Freq: Once | SUBCUTANEOUS | Status: AC
  Administered 2020-01-28: 60 mg via SUBCUTANEOUS

## 2020-01-28 MED ORDER — NAPROXEN 375 MG PO TABS: 375.0000 mg | ORAL_TABLET | Freq: Two times a day (BID) | ORAL | 0 refills | Status: DC | PRN

## 2020-01-28 NOTE — Patient Instructions (Addendum)
Neck xrays today  No labs today.  I will see if we can do prolia shot today.  Schedule 6 week medicare wellness/physical appointment.

## 2020-01-28 NOTE — Progress Notes (Signed)
This visit was conducted in person.  BP 122/70 (BP Location: Right Arm, Patient Position: Sitting, Cuff Size: Normal)   Pulse (!) 122   Temp 98 F (36.7 C) (Temporal)   Ht 5' 4.5" (1.638 m)   Wt 118 lb (53.5 kg)   SpO2 97% Comment: 4 L  BMI 19.94 kg/m    CC: hosp f/u visit  Subjective:    Patient ID: Christine Serrano, female    DOB: [birth year], 59 y.o.   MRN: [id number]  HPI: Christine Serrano is a 59 y.o. female presenting on 01/28/2020 for Hospitalization Follow-up   Recent hospitalization for acute on chronic hypoxic respiratory failure due to COPD exacerbation (at baseline on 4L/min) after oxygen ran out while outdoors. Treated with magnesium, solumedrol and duonebs. Also treated with azithromycin and discharged on prednisone taper. Has now oxygen concentrator by [employer].   Previously had discussed anoro ellipta and adding xopenex PRN due to tachycardia - these medications were unaffordable.   Saw pulm in f/u on Tuesday - note reviewed.  She continues to smoke 2 ppd --> has decreased use.  She continues 4L by [place].  She is now on symbicort and spiriva with PRN albuterol --> taking 2 puffs TID.  Planned HHPT, continues palliative care at home.   Notes ongoing chronic neck pain associated with L arm numbness and paresthesias. Had horseback riding accident 1980s - discomfort since then. Unsure if steroid use has helped neck pain.   Admit date: 01/08/2020  Discharge date: 01/11/2020 TCM hosp f/u phone call attempted x2  Admitted From: Home. Disposition:  Home  Recommendations for Outpatient Follow-up:  1. Follow up with PCP in 1-2 weeks 2. Please obtain BMP/CBC in one week 3. Advised to take Zithromax for 1 more days to complete 5-day course for COPD exacerbation. 4. Advised to continue prednisone taper, take it as directed 5. Advised to continue oxygen as required.  Home Health: No Equipment/Devices: Oxygen,   Discharge Condition: Stable. CODE STATUS:Full  code Diet recommendation: Heart Healthy   Discharge Diagnoses:  Active Problems:   Moderate recurrent major depression (HCC)   Chronic constipation   Alcohol abuse, in remission   COPD, very severe ([place])   Tobacco abuse   Anxiety     Relevant past medical, surgical, family and social history reviewed and updated as indicated. Interim medical history since our last visit reviewed. Allergies and medications reviewed and updated. Outpatient Medications Prior to Visit  Medication Sig Dispense Refill  . Ascorbic Acid (VITAMIN C WITH ROSE HIPS) 500 MG tablet Take 500 mg by mouth daily.    Marland Kitchen aspirin EC 81 MG tablet Take 81 mg by mouth daily.    . budesonide-formoterol (SYMBICORT) 160-4.5 MCG/ACT inhaler Inhale 2 puffs into the lungs 2 (two) times daily. 3 Inhaler 0  . buPROPion (WELLBUTRIN XL) 150 MG 24 hr tablet Take 1 tablet (150 mg total) by mouth daily. 90 tablet 1  . cholecalciferol (VITAMIN D3) 25 MCG (1000 UNIT) tablet Take 1,000 Units by mouth daily.    . fluticasone (FLONASE) 50 MCG/ACT nasal spray Place 2 sprays into both nostrils daily. (Patient taking differently: Place 2 sprays into both nostrils daily as needed for allergies or rhinitis. ) 48 g 3  . folic acid (FOLVITE) 1 MG tablet Take 1 tablet (1 mg total) by mouth daily. 90 tablet 3  . gabapentin (NEURONTIN) 100 MG capsule Take 1 capsule (100 mg total) by mouth 2 (two) times daily. 180 capsule 1  . hydrOXYzine (  ATARAX/VISTARIL) 25 MG tablet TAKE 1 TABLET (25 MG TOTAL) BY MOUTH 2 (TWO) TIMES DAILY AS NEEDED FOR ANXIETY. (Patient taking differently: Take 25 mg by mouth 2 (two) times daily as needed for anxiety. ) 180 tablet 0  . LINZESS 290 MCG CAPS capsule Take 1 capsule (290 mcg total) by mouth daily as needed (constipation). (Patient taking differently: Take 290 mcg by mouth daily before breakfast. ) 30 capsule 3  . meclizine (ANTIVERT) 25 MG tablet Take 1 tablet (25 mg total) by mouth 3 (three) times daily as needed for  dizziness. 30 tablet 0  . mirtazapine (REMERON) 15 MG tablet TAKE 1 TABLET BY MOUTH EVERYDAY AT BEDTIME (Patient taking differently: Take 15 mg by mouth at bedtime. ) 90 tablet 3  . Multiple Vitamin (MULTIVITAMIN WITH MINERALS) TABS tablet Take 1 tablet by mouth daily.    Marland Kitchen omeprazole (PRILOSEC) 40 MG capsule TAKE 1 CAPSULE BY MOUTH DAILY. FOR THREE WEEKS THEN AS NEEDED. TAKE 30 MIN PRIOR TO LARGE MEAL (Patient taking differently: Take 40 mg by mouth daily. ) 90 capsule 1  . OXYGEN Inhale 4 L/min into the lungs continuous.     . sertraline (ZOLOFT) 100 MG tablet Take 1.5 tablets (150 mg total) by mouth daily. 135 tablet 3  . thiamine 100 MG tablet Take 1 tablet (100 mg total) by mouth daily. 30 tablet 0  . tiotropium (SPIRIVA HANDIHALER) 18 MCG inhalation capsule Place 1 capsule (18 mcg total) into inhaler and inhale daily. 90 capsule 0  . methocarbamol (ROBAXIN) 500 MG tablet Take 1 tablet (500 mg total) by mouth 3 (three) times daily as needed for muscle spasms (sedation precautions). 90 tablet 1  . naproxen (NAPROSYN) 375 MG tablet Take 1 tablet (375 mg total) by mouth 2 (two) times daily as needed for moderate pain. 40 tablet 1  . albuterol (PROVENTIL) (2.5 MG/3ML) 0.083% nebulizer solution Take 3 mLs (2.5 mg total) by nebulization every 2 (two) hours as needed for wheezing. 75 mL 12  . ipratropium-albuterol (DUONEB) 0.5-2.5 (3) MG/3ML SOLN SMARTSIG:3 Milliliter(s) Via Nebulizer Every 6 Hours    . arformoterol (BROVANA) 15 MCG/2ML NEBU Take 2 mLs (15 mcg total) by nebulization 2 (two) times daily. 120 mL 0   No facility-administered medications prior to visit.     Per HPI unless specifically indicated in ROS section below Review of Systems Objective:  BP 122/70 (BP Location: Right Arm, Patient Position: Sitting, Cuff Size: Normal)   Pulse (!) 122   Temp 98 F (36.7 C) (Temporal)   Ht 5' 4.5" (1.638 m)   Wt 118 lb (53.5 kg)   SpO2 97% Comment: 4 L  BMI 19.94 kg/m   Wt Readings from  Last 3 Encounters:  01/28/20 118 lb (53.5 kg)  01/11/20 119 lb 14.9 oz (54.4 kg)  01/07/20 121 lb 1 oz (54.9 kg)      Physical Exam Vitals and nursing note reviewed.  Constitutional:      Appearance: Normal appearance. She is not ill-appearing.     Comments: 4L [place] in place  Cardiovascular:     Rate and Rhythm: Regular rhythm. Tachycardia present.     Pulses: Normal pulses.     Heart sounds: Normal heart sounds. No murmur heard.   Pulmonary:     Effort: Pulmonary effort is normal. No respiratory distress.     Breath sounds: Wheezing present. No rhonchi or rales.     Comments: Diminished breath sounds coarse throughout with mild exp wheezing R lung Musculoskeletal:  Right lower leg: No edema.     Left lower leg: No edema.  Neurological:     Mental Status: She is alert.  Psychiatric:        Mood and Affect: Mood normal.        Behavior: Behavior normal.       DG Chest 2 View CLINICAL DATA:  Short of breath.  History of COPD.  EXAM: CHEST - 2 VIEW  COMPARISON:  12/10/2019  FINDINGS: The heart size is normal. Prominence of the pulmonary arteries noted bilaterally suggestive of PA hypertension. No pleural effusion or edema identified. The lungs are hyperinflated with interstitial changes of emphysema. Bilateral upper lobe scarring and architectural distortion with upward retraction of the hilar structures noted, unchanged. No superimposed pleural effusion, pulmonary edema or airspace consolidation.  IMPRESSION: 1. No active cardiopulmonary abnormalities. 2. Emphysema with chronic bilateral upper lobe scarring.  Electronically Signed   By: Kerby Moors M.D.   On: 01/08/2020 08:08   Assessment & Plan:  This visit occurred during the SARS-CoV-2 public health emergency.  Safety protocols were in place, including screening questions prior to the visit, additional usage of staff PPE, and extensive cleaning of exam room while observing appropriate contact time as  indicated for disinfecting solutions.  No need for labs today.  Problem List Items Addressed This Visit    Tobacco abuse    Continue to encourage cessation - has decreased to <2ppd      Tachycardia    Again noted today - sounds regular. ?due to ongoing albuterol use - but xopenex was unaffordable.       Osteoporosis    Was scheduled for prolia later this month - will see if we can administer today --> done today.       Hypocalcemia    Latest calcium reviewed and normal. Ok to proceed with prolia injection today.       COPD, very severe ([place]) - Primary    Overall improved period since she's restarted regular spiriva and symbicort use. Encouraged continue this. She also recently finished a prednisone and abx course. Continues chronic supplemental O2 at 4L as well as albuterol inhaler PRN (2 puffs TID). Continue to encourage full smoking cessation - she is trying.       Relevant Medications   ipratropium-albuterol (DUONEB) 0.5-2.5 (3) MG/3ML SOLN   Chronic respiratory failure with hypoxia, on home O2 therapy (HCC)    Continue supplemental oxygen at 4L [place].      Cervical neck pain with evidence of disc disease    Notes ongoing posterior neck pain with intermittent numbness and radicular pain down left arm - ongoing for almost the past year. Will check baseline cervical spine films.  Will refill NSAID and robaxin which is helpful.       Relevant Orders   DG Cervical Spine Complete       Meds ordered this encounter  Medications  . methocarbamol (ROBAXIN) 500 MG tablet    Sig: Take 1 tablet (500 mg total) by mouth 3 (three) times daily as needed for muscle spasms (sedation precautions).    Dispense:  90 tablet    Refill:  0  . naproxen (NAPROSYN) 375 MG tablet    Sig: Take 1 tablet (375 mg total) by mouth 2 (two) times daily as needed for moderate pain. Take with food    Dispense:  40 tablet    Refill:  0  . denosumab (PROLIA) injection 60 mg   Orders Placed This Encounter  Procedures  . DG Cervical Spine Complete    Standing Status:   Future    Number of Occurrences:   1    Standing Expiration Date:   01/27/2021    Order Specific Question:   Reason for Exam (SYMPTOM  OR DIAGNOSIS REQUIRED)    Answer:   cervical neck pain with radiculopathy    Order Specific Question:   Preferred imaging location?    Answer:   [facility]    Order Specific Question:   Radiology Contrast Protocol - do NOT remove file path    Answer:   [email or web address]    Patient Instructions  Neck xrays today  No labs today.  I will see if we can do prolia shot today.  Schedule 6 week medicare wellness/physical appointment.    Follow up plan: Return in about 6 weeks (around 03/10/2020) for annual exam, prior fasting for blood work, medicare wellness visit.  Ria Bush, MD

## 2020-01-29 ENCOUNTER — Encounter: Payer: Self-pay | Admitting: Family Medicine

## 2020-01-29 DIAGNOSIS — M5090 Cervical disc disorder, unspecified, unspecified cervical region: Secondary | ICD-10-CM | POA: Insufficient documentation

## 2020-01-29 NOTE — Assessment & Plan Note (Signed)
Continue to encourage cessation - has decreased to <2ppd

## 2020-01-29 NOTE — Assessment & Plan Note (Addendum)
Notes ongoing posterior neck pain with intermittent numbness and radicular pain down left arm - ongoing for almost the past year. Will check baseline cervical spine films.  Will refill NSAID and robaxin which is helpful.

## 2020-01-29 NOTE — Assessment & Plan Note (Addendum)
Was scheduled for prolia later this month - will see if we can administer today --> done today.

## 2020-01-29 NOTE — Assessment & Plan Note (Signed)
Continue supplemental oxygen at 4L [place].

## 2020-01-29 NOTE — Assessment & Plan Note (Signed)
Latest calcium reviewed and normal. Ok to proceed with prolia injection today.

## 2020-01-29 NOTE — Assessment & Plan Note (Addendum)
Overall improved period since she's restarted regular spiriva and symbicort use. Encouraged continue this. She also recently finished a prednisone and abx course. Continues chronic supplemental O2 at 4L as well as albuterol inhaler PRN (2 puffs TID). Continue to encourage full smoking cessation - she is trying.

## 2020-01-29 NOTE — Assessment & Plan Note (Signed)
Again noted today - sounds regular. ?due to ongoing albuterol use - but xopenex was unaffordable.

## 2020-02-01 ENCOUNTER — Other Ambulatory Visit: Payer: Self-pay

## 2020-02-01 NOTE — Patient Outreach (Signed)
[facility]) Care Management  02/01/2020  Christine Serrano Nov 11, 1960 [id number]   Transition of Care Referral  Referral Date:01/12/2020 Referral [place] Date of Discharge:01/11/2020 [place] Medicare PCP Office Does TOC    Multiple attempts to establish contact with patient without success. No response from letter mailed to patient. Case is being closed at this time.    Plan: RN CM will close case at this time.   [name], RN,BSN,CCM [place] [facility] Telephonic Care Management Coordinator Direct Phone: [phone] Toll Free: [phone] Fax: [phone]

## 2020-02-02 ENCOUNTER — Other Ambulatory Visit: Payer: Self-pay | Admitting: Family Medicine

## 2020-02-02 ENCOUNTER — Ambulatory Visit: Payer: Medicare HMO

## 2020-02-04 ENCOUNTER — Ambulatory Visit: Payer: Self-pay

## 2020-02-04 ENCOUNTER — Telehealth: Payer: Self-pay

## 2020-02-04 NOTE — Telephone Encounter (Signed)
Pt calling.  C/o cough and is requesting abx before cough becomes bad enough to be admitted to hospital again for COPD exacerbation.  Advised pt to seek urgent care due to no available appts.  Pt verbalizes understanding.  FYI to Dr. Damita Dunnings.

## 2020-02-04 NOTE — Telephone Encounter (Signed)
Noted.  Agree with UC eval.

## 2020-02-06 DIAGNOSIS — J449 Chronic obstructive pulmonary disease, unspecified: Secondary | ICD-10-CM | POA: Diagnosis not present

## 2020-02-08 ENCOUNTER — Telehealth (INDEPENDENT_AMBULATORY_CARE_PROVIDER_SITE_OTHER): Payer: Medicare HMO | Admitting: Family Medicine

## 2020-02-08 ENCOUNTER — Encounter: Payer: Self-pay | Admitting: Family Medicine

## 2020-02-08 ENCOUNTER — Other Ambulatory Visit: Payer: Self-pay

## 2020-02-08 VITALS — BP 106/60 | HR 115 | Ht 64.5 in | Wt 118.0 lb

## 2020-02-08 DIAGNOSIS — Z720 Tobacco use: Secondary | ICD-10-CM | POA: Diagnosis not present

## 2020-02-08 DIAGNOSIS — R000 Tachycardia, unspecified: Secondary | ICD-10-CM

## 2020-02-08 DIAGNOSIS — J9611 Chronic respiratory failure with hypoxia: Secondary | ICD-10-CM | POA: Diagnosis not present

## 2020-02-08 DIAGNOSIS — Z9981 Dependence on supplemental oxygen: Secondary | ICD-10-CM | POA: Diagnosis not present

## 2020-02-08 DIAGNOSIS — J441 Chronic obstructive pulmonary disease with (acute) exacerbation: Secondary | ICD-10-CM | POA: Diagnosis not present

## 2020-02-08 DIAGNOSIS — J449 Chronic obstructive pulmonary disease, unspecified: Secondary | ICD-10-CM | POA: Diagnosis not present

## 2020-02-08 MED ORDER — DOXYCYCLINE HYCLATE 100 MG PO TABS: 100.0000 mg | ORAL_TABLET | Freq: Two times a day (BID) | ORAL | 0 refills | Status: DC

## 2020-02-08 MED ORDER — PREDNISONE 20 MG PO TABS: 40.0000 mg | ORAL_TABLET | Freq: Every day | ORAL | 0 refills | Status: DC

## 2020-02-08 MED ORDER — BENZONATATE 100 MG PO CAPS
100.0000 mg | ORAL_CAPSULE | Freq: Three times a day (TID) | ORAL | 3 refills | Status: DC | PRN
Start: 2020-02-08 — End: 2020-06-06

## 2020-02-08 NOTE — Assessment & Plan Note (Signed)
Ongoing. Anticipate multifactorial including albuterol use.

## 2020-02-08 NOTE — Assessment & Plan Note (Addendum)
She is up to 2.5 ppd. Again strongly encouraged full cessation. Partner Angie may need to quit for upcoming back surgery - she may try to quit with her.

## 2020-02-08 NOTE — Assessment & Plan Note (Signed)
Increased cough, dyspnea, sputum production. Meets criteria for COPD exacerbation. Rx doxcycyline, prednisone burst, refilled tessalon perls. Red flags to seek in person care reviewed. Upcoming pulm in-office appt next week.

## 2020-02-08 NOTE — Progress Notes (Signed)
Virtual visit completed through MyChart, a video enabled telemedicine application. Due to national recommendations of social distancing due to COVID-19, a virtual visit is felt to be most appropriate for this patient at this time. Reviewed limitations, risks, security and privacy concerns of performing a virtual visit and the availability of in person appointments. I also reviewed that there may be a patient responsible charge related to this service. The patient agreed to proceed.   Patient location: home Provider location: [place] at [facility], office Persons participating in this virtual visit: patient, provider   If any vitals were documented, they were collected by patient at home unless specified below.    BP 106/60    Pulse (!) 115    Ht 5' 4.5" (1.638 m)    Wt 118 lb (53.5 kg)    SpO2 97% Comment: 4L [place]   BMI 19.94 kg/m    CC: cough Subjective:    Patient ID: Christine Serrano, female    DOB: [birth year], 59 y.o.   MRN: [id number]  HPI: Christine Serrano is a 59 y.o. female presenting on 02/08/2020 for Cough (C/o productive cough with green/yellow mucous.  States cough is a lot deeper.  Started 02/03/20.  Requests new rx for benzonatate. )   Several hospitalizations for acute on chronic hypoxic respiratory failure due to COPD exacerbation (baseline 4L/min by [place]). Latest treated with azithromycin and prednisone. She had been doing better   Acute worsening productive cough over the past 1 week with green/yellow mucous.  She did not go to [facility].  Increased dyspnea. Increased sputum production and increased wheezing.  Has needed   Continues symbicort and spiriva with PRN albuterol - neb Q6 hours, inhaler Q6 hours.  Continued smoker 2.5 ppd.  No fevers/chills.   Upcoming pulm appt 02/17/2020.      Relevant past medical, surgical, family and social history reviewed and updated as indicated. Interim medical history since our last visit reviewed. Allergies and medications reviewed and  updated. Outpatient Medications Prior to Visit  Medication Sig Dispense Refill   albuterol (PROVENTIL) (2.5 MG/3ML) 0.083% nebulizer solution Take 3 mLs (2.5 mg total) by nebulization every 2 (two) hours as needed for wheezing. 75 mL 12   Ascorbic Acid (VITAMIN C WITH ROSE HIPS) 500 MG tablet Take 500 mg by mouth daily.     aspirin EC 81 MG tablet Take 81 mg by mouth daily.     budesonide-formoterol (SYMBICORT) 160-4.5 MCG/ACT inhaler Inhale 2 puffs into the lungs 2 (two) times daily. 3 Inhaler 0   buPROPion (WELLBUTRIN XL) 150 MG 24 hr tablet Take 1 tablet (150 mg total) by mouth daily. 90 tablet 1   cholecalciferol (VITAMIN D3) 25 MCG (1000 UNIT) tablet Take 1,000 Units by mouth daily.     fluticasone (FLONASE) 50 MCG/ACT nasal spray Place 2 sprays into both nostrils daily. (Patient taking differently: Place 2 sprays into both nostrils daily as needed for allergies or rhinitis. ) 48 g 3   folic acid (FOLVITE) 1 MG tablet Take 1 tablet (1 mg total) by mouth daily. 90 tablet 3   gabapentin (NEURONTIN) 100 MG capsule Take 1 capsule (100 mg total) by mouth 2 (two) times daily. 180 capsule 1   hydrOXYzine (ATARAX/VISTARIL) 25 MG tablet TAKE 1 TABLET (25 MG TOTAL) BY MOUTH 2 (TWO) TIMES DAILY AS NEEDED FOR ANXIETY. (Patient taking differently: Take 25 mg by mouth 2 (two) times daily as needed for anxiety. ) 180 tablet 0   ipratropium-albuterol (DUONEB) 0.5-2.5 (  3) MG/3ML SOLN SMARTSIG:3 Milliliter(s) Via Nebulizer Every 6 Hours     LINZESS 290 MCG CAPS capsule TAKE 1 CAPSULE EVERY DAY AS NEEDED FOR CONSTIPATION AS DIRECTED 30 capsule 3   meclizine (ANTIVERT) 25 MG tablet Take 1 tablet (25 mg total) by mouth 3 (three) times daily as needed for dizziness. 30 tablet 0   methocarbamol (ROBAXIN) 500 MG tablet Take 1 tablet (500 mg total) by mouth 3 (three) times daily as needed for muscle spasms (sedation precautions). 90 tablet 0   mirtazapine (REMERON) 15 MG tablet TAKE 1 TABLET BY MOUTH  EVERYDAY AT BEDTIME (Patient taking differently: Take 15 mg by mouth at bedtime. ) 90 tablet 3   Multiple Vitamin (MULTIVITAMIN WITH MINERALS) TABS tablet Take 1 tablet by mouth daily.     naproxen (NAPROSYN) 375 MG tablet Take 1 tablet (375 mg total) by mouth 2 (two) times daily as needed for moderate pain. Take with food 40 tablet 0   omeprazole (PRILOSEC) 40 MG capsule TAKE 1 CAPSULE BY MOUTH DAILY. FOR THREE WEEKS THEN AS NEEDED. TAKE 30 MIN PRIOR TO LARGE MEAL (Patient taking differently: Take 40 mg by mouth daily. ) 90 capsule 1   OXYGEN Inhale 4 L/min into the lungs continuous.      sertraline (ZOLOFT) 100 MG tablet Take 1.5 tablets (150 mg total) by mouth daily. 135 tablet 3   thiamine 100 MG tablet Take 1 tablet (100 mg total) by mouth daily. 30 tablet 0   tiotropium (SPIRIVA HANDIHALER) 18 MCG inhalation capsule Place 1 capsule (18 mcg total) into inhaler and inhale daily. 90 capsule 0   No facility-administered medications prior to visit.     Per HPI unless specifically indicated in ROS section below Review of Systems Objective:  BP 106/60    Pulse (!) 115    Ht 5' 4.5" (1.638 m)    Wt 118 lb (53.5 kg)    SpO2 97% Comment: 4L [place]   BMI 19.94 kg/m   Wt Readings from Last 3 Encounters:  02/08/20 118 lb (53.5 kg)  01/28/20 118 lb (53.5 kg)  01/11/20 119 lb 14.9 oz (54.4 kg)       Physical exam: Gen: alert, NAD, not ill appearing Pulm: speaks in complete sentences without increased work of breathing Psych: normal mood, normal thought content      Assessment & Plan:   Problem List Items Addressed This Visit    Tobacco abuse    She is up to 2.5 ppd. Again strongly encouraged full cessation. Partner Angie may need to quit for upcoming back surgery - she may try to quit with her.       Tachycardia    Ongoing. Anticipate multifactorial including albuterol use.       COPD, very severe ([place])   Relevant Medications   predniSONE (DELTASONE) 20 MG tablet   benzonatate  (TESSALON) 100 MG capsule   COPD exacerbation (HCC) - Primary    Increased cough, dyspnea, sputum production. Meets criteria for COPD exacerbation. Rx doxcycyline, prednisone burst, refilled tessalon perls. Red flags to seek in person care reviewed. Upcoming pulm in-office appt next week.       Relevant Medications   predniSONE (DELTASONE) 20 MG tablet   benzonatate (TESSALON) 100 MG capsule   Chronic respiratory failure with hypoxia, on home O2 therapy (HCC)       Meds ordered this encounter  Medications   predniSONE (DELTASONE) 20 MG tablet    Sig: Take 2 tablets (40 mg total)  by mouth daily with breakfast.    Dispense:  10 tablet    Refill:  0   benzonatate (TESSALON) 100 MG capsule    Sig: Take 1 capsule (100 mg total) by mouth 3 (three) times daily as needed for cough.    Dispense:  30 capsule    Refill:  3   doxycycline (VIBRA-TABS) 100 MG tablet    Sig: Take 1 tablet (100 mg total) by mouth 2 (two) times daily.    Dispense:  14 tablet    Refill:  0   No orders of the defined types were placed in this encounter.   I discussed the assessment and treatment plan with the patient. The patient was provided an opportunity to ask questions and all were answered. The patient agreed with the plan and demonstrated an understanding of the instructions. The patient was advised to call back or seek an in-person evaluation if the symptoms worsen or if the condition fails to improve as anticipated.  Follow up plan: Return if symptoms worsen or fail to improve.  Ria Bush, MD

## 2020-02-15 ENCOUNTER — Ambulatory Visit: Payer: Medicare HMO | Admitting: Primary Care

## 2020-02-17 ENCOUNTER — Other Ambulatory Visit: Payer: Self-pay | Admitting: Adult Health Nurse Practitioner

## 2020-02-17 ENCOUNTER — Other Ambulatory Visit: Payer: Self-pay

## 2020-02-17 DIAGNOSIS — Z9981 Dependence on supplemental oxygen: Secondary | ICD-10-CM

## 2020-02-17 DIAGNOSIS — Z515 Encounter for palliative care: Secondary | ICD-10-CM | POA: Diagnosis not present

## 2020-02-17 DIAGNOSIS — J9611 Chronic respiratory failure with hypoxia: Secondary | ICD-10-CM

## 2020-02-17 NOTE — Progress Notes (Signed)
[place] Consult Note Telephone: [phone]  Fax: [phone]  PATIENT NAME: Christine Serrano DOB: Apr 15, 1961 MRN: [id number]  PRIMARY CARE PROVIDER:   Ria Bush, MD  REFERRING PROVIDER:  Ria Bush, MD [place],  [place] [address]  RESPONSIBLE PARTY:   Self [id number] [name] SO/HCPOA [phone]   RECOMMENDATIONS and PLAN: 1.Advanced care planning. Patient is DNR  2.  COPD.  She does sleep with head of the bed elevated and with two pillows, this is baseline. She is independent of ADLs but has to take frequent rest breaks due to dyspnea. She was recently treated for respiratory infection after having increased cough and greenish sputum.  She is feeling better with less cough.  Still working in getting her motorized scooter through [employer].  States that they will take her insurance.  Breathing has been better since restarting her symbicort and spiriva.  She has been able to do more straightening up around the house.  Discussed smoking cessation. Continue follow up and recommendations by pulmonology.  Has appointment with pulmonology next week.    3.  Depression.  Patient's depression seems to be well controlled on Wellbutrin 150 mg daily and Remeron 15 mg nightly.  Her appetite continues to be good and current weight has been stable.  Continue current medications for depression  Denies fever, headaches, pain, dizziness, N/V/D, constipation.  Palliative will continue to monitor for symptom management/decline and make recommendations as needed.  Have appointment in 8 weeks.  Encouraged to call with any questions or concerns  I spent 60 minutes providing this consultation,  from 1:00 to 2:00 including time spent with patient/family, chart review, chart review, documentation.  More than 50% of the time in this consultation was spent coordinating communication.   HISTORY OF PRESENT  ILLNESS:  Christine Serrano is a 59 y.o. year old female with multiple medical problems including severe COPD, HLD, depression. Palliative Care was asked to help address goals of care.   CODE STATUS: DNR  PPS: 40% HOSPICE ELIGIBILITY/DIAGNOSIS: TBD  PHYSICAL EXAM: BP 118/68 HR 112 O2 98% on 4L General: NAD, frail appearing, thin Cardiovascular:tachycardic;regular rate and rhythm Pulmonary:lung sounds diminished with poor air movementwith wheezing heard throughout; normal respiratory effort Abdomen: soft, nontender, + bowel sounds GU: no suprapubic tenderness Extremities: no edema, no joint deformities Skin: no rasheson exposed skin Neurological: Weakness but otherwise nonfocal; is having more forgetfulness  PAST MEDICAL HISTORY:  Past Medical History:  Diagnosis Date  . Cervical cancer ([place])   . Chronic idiopathic constipation   . Community acquired pneumonia of right upper lobe of lung 12/10/2016  . COPD (chronic obstructive pulmonary disease) ([place])   . Depression   . Endometriosis   . HLD (hyperlipidemia)   . Smoker     SOCIAL HX:  Social History   Tobacco Use  . Smoking status: Current Every Day Smoker    Packs/day: 3.00    Years: 50.00    Pack years: 150.00    Types: Cigarettes  . Smokeless tobacco: Never Used  . Tobacco comment: 10/18/19 pt somkes 2 packs per day ARJ  Substance Use Topics  . Alcohol use: Yes    Comment: occasional    ALLERGIES:  Allergies  Allergen Reactions  . Terbinafine And Related Diarrhea    Diarrheal illness while on terbinafine ?related to med  . Imitrex [Sumatriptan] Other (See Comments)    Worsens migraine      PERTINENT MEDICATIONS:  Outpatient Encounter Medications as of 02/17/2020  Medication Sig  . albuterol (PROVENTIL) (2.5 MG/3ML) 0.083% nebulizer solution Take 3 mLs (2.5 mg total) by nebulization every 2 (two) hours as needed for wheezing.  . Ascorbic Acid (VITAMIN C WITH ROSE HIPS) 500 MG tablet Take 500 mg by mouth  daily.  Marland Kitchen aspirin EC 81 MG tablet Take 81 mg by mouth daily.  . benzonatate (TESSALON) 100 MG capsule Take 1 capsule (100 mg total) by mouth 3 (three) times daily as needed for cough.  . budesonide-formoterol (SYMBICORT) 160-4.5 MCG/ACT inhaler Inhale 2 puffs into the lungs 2 (two) times daily.  Marland Kitchen buPROPion (WELLBUTRIN XL) 150 MG 24 hr tablet Take 1 tablet (150 mg total) by mouth daily.  . cholecalciferol (VITAMIN D3) 25 MCG (1000 UNIT) tablet Take 1,000 Units by mouth daily.  Marland Kitchen doxycycline (VIBRA-TABS) 100 MG tablet Take 1 tablet (100 mg total) by mouth 2 (two) times daily.  . fluticasone (FLONASE) 50 MCG/ACT nasal spray Place 2 sprays into both nostrils daily. (Patient taking differently: Place 2 sprays into both nostrils daily as needed for allergies or rhinitis. )  . folic acid (FOLVITE) 1 MG tablet Take 1 tablet (1 mg total) by mouth daily.  Marland Kitchen gabapentin (NEURONTIN) 100 MG capsule Take 1 capsule (100 mg total) by mouth 2 (two) times daily.  . hydrOXYzine (ATARAX/VISTARIL) 25 MG tablet TAKE 1 TABLET (25 MG TOTAL) BY MOUTH 2 (TWO) TIMES DAILY AS NEEDED FOR ANXIETY. (Patient taking differently: Take 25 mg by mouth 2 (two) times daily as needed for anxiety. )  . ipratropium-albuterol (DUONEB) 0.5-2.5 (3) MG/3ML SOLN SMARTSIG:3 Milliliter(s) Via Nebulizer Every 6 Hours  . LINZESS 290 MCG CAPS capsule TAKE 1 CAPSULE EVERY DAY AS NEEDED FOR CONSTIPATION AS DIRECTED  . meclizine (ANTIVERT) 25 MG tablet Take 1 tablet (25 mg total) by mouth 3 (three) times daily as needed for dizziness.  . methocarbamol (ROBAXIN) 500 MG tablet Take 1 tablet (500 mg total) by mouth 3 (three) times daily as needed for muscle spasms (sedation precautions).  . mirtazapine (REMERON) 15 MG tablet TAKE 1 TABLET BY MOUTH EVERYDAY AT BEDTIME (Patient taking differently: Take 15 mg by mouth at bedtime. )  . Multiple Vitamin (MULTIVITAMIN WITH MINERALS) TABS tablet Take 1 tablet by mouth daily.  . naproxen (NAPROSYN) 375 MG tablet  Take 1 tablet (375 mg total) by mouth 2 (two) times daily as needed for moderate pain. Take with food  . omeprazole (PRILOSEC) 40 MG capsule TAKE 1 CAPSULE BY MOUTH DAILY. FOR THREE WEEKS THEN AS NEEDED. TAKE 30 MIN PRIOR TO LARGE MEAL (Patient taking differently: Take 40 mg by mouth daily. )  . OXYGEN Inhale 4 L/min into the lungs continuous.   . predniSONE (DELTASONE) 20 MG tablet Take 2 tablets (40 mg total) by mouth daily with breakfast.  . sertraline (ZOLOFT) 100 MG tablet Take 1.5 tablets (150 mg total) by mouth daily.  Marland Kitchen thiamine 100 MG tablet Take 1 tablet (100 mg total) by mouth daily.  Marland Kitchen tiotropium (SPIRIVA HANDIHALER) 18 MCG inhalation capsule Place 1 capsule (18 mcg total) into inhaler and inhale daily.  . [DISCONTINUED] budesonide (PULMICORT) 0.5 MG/2ML nebulizer solution Take 2 mLs (0.5 mg total) by nebulization 2 (two) times daily.   No facility-administered encounter medications on file as of 02/17/2020.     Brandy Zuba Jenetta Downer, NP

## 2020-02-21 ENCOUNTER — Other Ambulatory Visit: Payer: Self-pay | Admitting: Family Medicine

## 2020-02-22 ENCOUNTER — Ambulatory Visit: Payer: Medicare HMO | Admitting: Adult Health

## 2020-02-23 ENCOUNTER — Other Ambulatory Visit: Payer: Self-pay | Admitting: Family Medicine

## 2020-02-23 NOTE — Telephone Encounter (Signed)
E-scribed refill.  Plz schedule wellness, cpe and lab visits.  

## 2020-03-02 ENCOUNTER — Telehealth: Payer: Self-pay

## 2020-03-02 DIAGNOSIS — J449 Chronic obstructive pulmonary disease, unspecified: Secondary | ICD-10-CM | POA: Diagnosis not present

## 2020-03-02 NOTE — Telephone Encounter (Signed)
Noted  

## 2020-03-02 NOTE — Telephone Encounter (Signed)
Pt called triage stating [employer] should be sending paperwork for a power scooter not wheelchair. Says she qualifies for a scooter. Please call her at [phone] if there are any questions.

## 2020-03-02 NOTE — Telephone Encounter (Signed)
Just an FYI her insuance will be changing from [facility] to [facility] Medicare. Part D will be [employer]. Refills will not be due until sometime in October.

## 2020-03-07 ENCOUNTER — Telehealth: Payer: Self-pay

## 2020-03-07 DIAGNOSIS — M810 Age-related osteoporosis without current pathological fracture: Secondary | ICD-10-CM

## 2020-03-07 NOTE — Telephone Encounter (Signed)
Last prolia inj was 07/29/19 so next inj is due after 01/27/20. PA was done and pt was set up for labs and inj but pt NS for inj.  Now there is a note in pt's chart that she has new insurance. She is changing from [facility] to [facility] Medicare.  This will most likely need to be resubmitted for benefits and the labs are too old and pt will need to have those redrawn.   Tried to reach pt but no answer. LVM. Please send return call to this nurse.

## 2020-03-07 NOTE — Telephone Encounter (Signed)
Virl Cagey, CMA     8:16 AM Note Pt is good to go, Prolia in fridge and labeled.     January 14, 2020 Me to Virl Cagey, CMA     6:12 PM Can you make sure the prolia is ordered for this pt. I did not see it in the frig.  Thanks.  Me     10:21 AM Note Ca is normal and CrCl is normal at 90.78mL/min. Effort for pt to have inj.    January 04, 2020 Ria Bush, MD     2:39 PM Note Noted.     Me to Ria Bush, MD     12:04 PM Pt has apt with you on 7/23 for hospital f/u and will need BMP for prolia inj scheduled for 8-18. Please add BMP to any orders you will be ordering.  Thank you.  Me     12:04 PM Note Pt has apt with PCP on 7/23. She will have lab for prolia at that apt. Scheduled prolia inj on 8/18.     December 27, 2019 Me     3:52 PM Note LVM        3:52 PM You contacted Lov[place], [place]   December 17, 2019     8:13 AM Lillia Pauls, [place] routed this conversation to Me  Lillia Pauls, [place]     8:13 AM Note Last Prolia injection 07-29-19.  Pt has [place],  [place] exp 06-16-20.  Pt owes approximately $260, ($240=20% for Prolia/$20 admin fee).

## 2020-03-07 NOTE — Telephone Encounter (Signed)
Pt LVM. Tried to call pt back but had to LVM.

## 2020-03-08 DIAGNOSIS — J449 Chronic obstructive pulmonary disease, unspecified: Secondary | ICD-10-CM | POA: Diagnosis not present

## 2020-03-10 ENCOUNTER — Ambulatory Visit: Payer: Medicare HMO | Admitting: Pulmonary Disease

## 2020-03-10 NOTE — Progress Notes (Deleted)
@Patient  ID: Christine Serrano, female    DOB: 1960/11/11, 59 y.o.   MRN: [id number]  No chief complaint on file.   Referring provider: Ria Bush, MD  HPI:  59 year old female current everyday smoker followed in our office for COPD and chronic respiratory failure  PMH: Depression, alcohol abuse, anxiety, Smoker/ Smoking History: Current smoker Maintenance: Symbicort 160, Spiriva Respimat 2.5 Pt of: Dr. Patsey Berthold  03/10/2020  - Visit     Questionaires / Pulmonary Flowsheets:   ACT:  No flowsheet data found.  MMRC: No flowsheet data found.  Epworth:  No flowsheet data found.  Tests:   CT chest 03/13/16:Severe emphysema with extensive disease in the upper lobes and superior segment of the right lower lobe. This could represent acute and/or chronic changes. In addition, there is an irregular spiculated density along the right minor fissure which is indeterminate. PFTs 01/15/18: FVC: 1.42 L (48 %pred), FEV1: 0.76 L (31 %pred), FEV1/FVC: 53% , TLC: 4.49 L (94 %pred), DLCO 30 %pred, DLCO/VA 51%. TLC likely underestimated by gas dilution technique. Severe Gas trappingwith RV at 172% predicted. CT chest 10/13/2019: Severe emphysema particularly upper lobes with upper lobe predominant scarring and architectural distortion no significant change from November 25, 2016 CT. Coronary artery calcifications and aortic calcification was noted. Chest x-ray 10/16/2019:Hyperinflated lungs with diffuse interstitial prominence, no acute infiltrate.  FENO:  No results found for: NITRICOXIDE  PFT: No flowsheet data found.  WALK:  SIX MIN WALK 07/20/2018 03/09/2018 06/11/2016  2 Minute Oxygen Saturation % 90 76 89  2 Minute HR 120 126 139  4 Minute Oxygen Saturation % 83 - 86  4 Minute HR 133 - 136  6 Minute Oxygen Saturation % 91 - 88  6 Minute HR 126 - 146    Imaging: No results found.  Lab Results:  CBC    Component Value Date/Time   WBC 7.8 01/10/2020 0601   RBC 4.11  01/10/2020 0601   HGB 11.5 (L) 01/10/2020 0601   HCT 35.8 (L) 01/10/2020 0601   PLT 207 01/10/2020 0601   MCV 87.1 01/10/2020 0601   MCH 28.0 01/10/2020 0601   MCHC 32.1 01/10/2020 0601   RDW 14.6 01/10/2020 0601   LYMPHSABS 0.6 (L) 01/08/2020 1226   MONOABS 0.0 (L) 01/08/2020 1226   EOSABS 0.0 01/08/2020 1226   BASOSABS 0.0 01/08/2020 1226    BMET    Component Value Date/Time   NA 136 01/10/2020 0601   K 4.7 01/10/2020 0601   CL 98 01/10/2020 0601   CO2 27 01/10/2020 0601   GLUCOSE 102 (H) 01/10/2020 0601   BUN 13 01/10/2020 0601   CREATININE 0.56 01/10/2020 0601   CALCIUM 9.1 01/10/2020 0601   GFRNONAA >60 01/10/2020 0601   GFRAA >60 01/10/2020 0601    BNP    Component Value Date/Time   BNP 21.9 01/08/2020 1226    ProBNP No results found for: PROBNP  Specialty Problems      Pulmonary Problems   COPD, very severe ([place])    Emphysema with upper lobe scarring by CT 11/2016      Pulmonary nodule   Nasal sinus congestion   COPD exacerbation (HCC)   Epistaxis   Chronic respiratory failure with hypoxia, on home O2 therapy (HCC)      Allergies  Allergen Reactions  . Terbinafine And Related Diarrhea    Diarrheal illness while on terbinafine ?related to med  . Imitrex [Sumatriptan] Other (See Comments)    Worsens migraine  Immunization History  Administered Date(s) Administered  . Influenza Split 04/17/2016  . Influenza,inj,Quad PF,6+ Mos 02/21/2017, 03/17/2018, 02/25/2019  . Pneumococcal Conjugate-13 05/11/2018  . Pneumococcal Polysaccharide-23 06/17/2008    Past Medical History:  Diagnosis Date  . Cervical cancer ([place])   . Chronic idiopathic constipation   . Community acquired pneumonia of right upper lobe of lung 12/10/2016  . COPD (chronic obstructive pulmonary disease) ([place])   . Depression   . Endometriosis   . HLD (hyperlipidemia)   . Smoker     Tobacco History: Social History   Tobacco Use  Smoking Status Current Every Day Smoker  .  Packs/day: 3.00  . Years: 50.00  . Pack years: 150.00  . Types: Cigarettes  Smokeless Tobacco Never Used  Tobacco Comment   10/18/19 pt somkes 2 packs per day ARJ   Ready to quit: Not Answered Counseling given: Not Answered Comment: 10/18/19 pt somkes 2 packs per day ARJ   Continue to not smoke  Outpatient Encounter Medications as of 03/10/2020  Medication Sig  . albuterol (PROVENTIL) (2.5 MG/3ML) 0.083% nebulizer solution Take 3 mLs (2.5 mg total) by nebulization every 2 (two) hours as needed for wheezing.  . Ascorbic Acid (VITAMIN C WITH ROSE HIPS) 500 MG tablet Take 500 mg by mouth daily.  Marland Kitchen aspirin EC 81 MG tablet Take 81 mg by mouth daily.  . benzonatate (TESSALON) 100 MG capsule Take 1 capsule (100 mg total) by mouth 3 (three) times daily as needed for cough.  . budesonide-formoterol (SYMBICORT) 160-4.5 MCG/ACT inhaler Inhale 2 puffs into the lungs 2 (two) times daily.  Marland Kitchen buPROPion (WELLBUTRIN XL) 150 MG 24 hr tablet Take 1 tablet (150 mg total) by mouth daily.  . cholecalciferol (VITAMIN D3) 25 MCG (1000 UNIT) tablet Take 1,000 Units by mouth daily.  Marland Kitchen doxycycline (VIBRA-TABS) 100 MG tablet Take 1 tablet (100 mg total) by mouth 2 (two) times daily.  . fluticasone (FLONASE) 50 MCG/ACT nasal spray Place 2 sprays into both nostrils daily. (Patient taking differently: Place 2 sprays into both nostrils daily as needed for allergies or rhinitis. )  . folic acid (FOLVITE) 1 MG tablet Take 1 tablet (1 mg total) by mouth daily.  Marland Kitchen gabapentin (NEURONTIN) 100 MG capsule Take 1 capsule (100 mg total) by mouth 2 (two) times daily.  . hydrOXYzine (ATARAX/VISTARIL) 25 MG tablet TAKE 1 TABLET (25 MG TOTAL) BY MOUTH 2 (TWO) TIMES DAILY AS NEEDED FOR ANXIETY.  Marland Kitchen ipratropium-albuterol (DUONEB) 0.5-2.5 (3) MG/3ML SOLN SMARTSIG:3 Milliliter(s) Via Nebulizer Every 6 Hours  . LINZESS 290 MCG CAPS capsule TAKE 1 CAPSULE EVERY DAY AS NEEDED FOR CONSTIPATION AS DIRECTED  . meclizine (ANTIVERT) 25 MG tablet  Take 1 tablet (25 mg total) by mouth 3 (three) times daily as needed for dizziness.  . methocarbamol (ROBAXIN) 500 MG tablet Take 1 tablet (500 mg total) by mouth 3 (three) times daily as needed for muscle spasms (sedation precautions).  . mirtazapine (REMERON) 15 MG tablet TAKE 1 TABLET BY MOUTH EVERYDAY AT BEDTIME (Patient taking differently: Take 15 mg by mouth at bedtime. )  . Multiple Vitamin (MULTIVITAMIN WITH MINERALS) TABS tablet Take 1 tablet by mouth daily.  . naproxen (NAPROSYN) 375 MG tablet Take 1 tablet (375 mg total) by mouth 2 (two) times daily as needed for moderate pain. Take with food  . omeprazole (PRILOSEC) 40 MG capsule TAKE 1 CAPSULE BY MOUTH DAILY. FOR THREE WEEKS THEN AS NEEDED. TAKE 30 MIN PRIOR TO LARGE MEAL (Patient taking differently: Take  40 mg by mouth daily. )  . OXYGEN Inhale 4 L/min into the lungs continuous.   . predniSONE (DELTASONE) 20 MG tablet Take 2 tablets (40 mg total) by mouth daily with breakfast.  . sertraline (ZOLOFT) 100 MG tablet Take 1.5 tablets (150 mg total) by mouth daily.  Marland Kitchen SPIRIVA HANDIHALER 18 MCG inhalation capsule INHALE THE CONTENTS OF 1 CAPSULE  VIA  HANDIHALER DAILY  . thiamine 100 MG tablet Take 1 tablet (100 mg total) by mouth daily.  . [DISCONTINUED] budesonide (PULMICORT) 0.5 MG/2ML nebulizer solution Take 2 mLs (0.5 mg total) by nebulization 2 (two) times daily.   No facility-administered encounter medications on file as of 03/10/2020.     Review of Systems  Review of Systems   Physical Exam  There were no vitals taken for this visit.  Wt Readings from Last 5 Encounters:  02/08/20 118 lb (53.5 kg)  01/28/20 118 lb (53.5 kg)  01/11/20 119 lb 14.9 oz (54.4 kg)  01/07/20 121 lb 1 oz (54.9 kg)  12/11/19 107 lb (48.5 kg)    BMI Readings from Last 5 Encounters:  02/08/20 19.94 kg/m  01/28/20 19.94 kg/m  01/11/20 20.27 kg/m  01/07/20 20.46 kg/m  12/11/19 18.08 kg/m     Physical Exam    Assessment & Plan:    No problem-specific Assessment & Plan notes found for this encounter.    No follow-ups on file.   Lauraine Rinne, NP 03/10/2020   This appointment required *** minutes of patient care (this includes precharting, chart review, review of results, face-to-face care, etc.).

## 2020-03-12 ENCOUNTER — Other Ambulatory Visit: Payer: Self-pay | Admitting: Family Medicine

## 2020-03-13 ENCOUNTER — Ambulatory Visit (INDEPENDENT_AMBULATORY_CARE_PROVIDER_SITE_OTHER): Payer: Medicare HMO | Admitting: Family Medicine

## 2020-03-13 ENCOUNTER — Other Ambulatory Visit: Payer: Self-pay

## 2020-03-13 ENCOUNTER — Encounter: Payer: Self-pay | Admitting: Family Medicine

## 2020-03-13 VITALS — BP 112/70 | HR 95 | Temp 97.7°F | Ht 64.5 in | Wt 113.0 lb

## 2020-03-13 DIAGNOSIS — R251 Tremor, unspecified: Secondary | ICD-10-CM

## 2020-03-13 DIAGNOSIS — J9611 Chronic respiratory failure with hypoxia: Secondary | ICD-10-CM | POA: Diagnosis not present

## 2020-03-13 DIAGNOSIS — J449 Chronic obstructive pulmonary disease, unspecified: Secondary | ICD-10-CM

## 2020-03-13 DIAGNOSIS — K5909 Other constipation: Secondary | ICD-10-CM

## 2020-03-13 DIAGNOSIS — S29012D Strain of muscle and tendon of back wall of thorax, subsequent encounter: Secondary | ICD-10-CM

## 2020-03-13 DIAGNOSIS — Z9981 Dependence on supplemental oxygen: Secondary | ICD-10-CM

## 2020-03-13 DIAGNOSIS — F331 Major depressive disorder, recurrent, moderate: Secondary | ICD-10-CM | POA: Diagnosis not present

## 2020-03-13 DIAGNOSIS — Z720 Tobacco use: Secondary | ICD-10-CM

## 2020-03-13 DIAGNOSIS — M810 Age-related osteoporosis without current pathological fracture: Secondary | ICD-10-CM

## 2020-03-13 DIAGNOSIS — Z23 Encounter for immunization: Secondary | ICD-10-CM | POA: Diagnosis not present

## 2020-03-13 DIAGNOSIS — Z515 Encounter for palliative care: Secondary | ICD-10-CM

## 2020-03-13 MED ORDER — POLYETHYLENE GLYCOL 3350 17 GM/SCOOP PO POWD
17.0000 g | Freq: Every day | ORAL | 1 refills | Status: DC | PRN
Start: 2020-03-13 — End: 2022-07-22

## 2020-03-13 NOTE — Assessment & Plan Note (Signed)
Continued smoker despite end stage COPD.

## 2020-03-13 NOTE — Assessment & Plan Note (Signed)
Overdue for prolia. I will ask her to speak with Larene Beach about this.  I have placed orders for future labs (BMP) prior to next injection.

## 2020-03-13 NOTE — Progress Notes (Signed)
This visit was conducted in person.  BP 112/70 (BP Location: Right Arm, Patient Position: Sitting, Cuff Size: Normal)   Pulse 95   Temp 97.7 F (36.5 C) (Temporal)   Ht 5' 4.5" (1.638 m)   Wt 113 lb (51.3 kg)   SpO2 92% Comment: 3 L  BMI 19.10 kg/m    CC: L shoulder pain  Subjective:    Patient ID: Christine Serrano, female    DOB: December 16, 1960, 59 y.o.   MRN: [id number]  HPI: Christine Serrano is a 59 y.o. female presenting on 03/13/2020 for Shoulder Pain (C/o left shoulder pain, worsening.  Also c/o shaking worsening. )   Switching to [employer] 03/17/2020.   Has established with [facility] palliative care at home - note from this month reviewed. DNR. Back on symbicort and spiriva. Cancelled pulm appt.   Ongoing L upper back pain - new feels tender lump at area - overlying shoulder blade. So far has tried muscle relaxant with temporary relief, ibuprofen. Denies inciting trauma/injury or fall. No increased neck pain. FROM at shoulder and neck.    Advised against NSAID use in h/o stomach ulcer.   Told would be set up for home pulm rehab - advised to f/u with pulm about this.   Chronic constipation despite linzess daily. Also takes OTC stool softener. 1-2 BM/wk, watery stools. No abd pain, blood in stool, mucous, fevers, nausea/vomiting.      Relevant past medical, surgical, family and social history reviewed and updated as indicated. Interim medical history since our last visit reviewed. Allergies and medications reviewed and updated. Outpatient Medications Prior to Visit  Medication Sig Dispense Refill  . albuterol (PROVENTIL) (2.5 MG/3ML) 0.083% nebulizer solution Take 3 mLs (2.5 mg total) by nebulization every 2 (two) hours as needed for wheezing. 75 mL 12  . Ascorbic Acid (VITAMIN C WITH ROSE HIPS) 500 MG tablet Take 500 mg by mouth daily.    Marland Kitchen aspirin EC 81 MG tablet Take 81 mg by mouth daily.    . benzonatate (TESSALON) 100 MG capsule Take 1 capsule (100 mg total) by  mouth 3 (three) times daily as needed for cough. 30 capsule 3  . budesonide-formoterol (SYMBICORT) 160-4.5 MCG/ACT inhaler Inhale 2 puffs into the lungs 2 (two) times daily. 3 Inhaler 0  . buPROPion (WELLBUTRIN XL) 150 MG 24 hr tablet Take 1 tablet (150 mg total) by mouth daily. 90 tablet 1  . cholecalciferol (VITAMIN D3) 25 MCG (1000 UNIT) tablet Take 1,000 Units by mouth daily.    Marland Kitchen doxycycline (VIBRA-TABS) 100 MG tablet Take 1 tablet (100 mg total) by mouth 2 (two) times daily. 14 tablet 0  . fluticasone (FLONASE) 50 MCG/ACT nasal spray Place 2 sprays into both nostrils daily. (Patient taking differently: Place 2 sprays into both nostrils daily as needed for allergies or rhinitis. ) 48 g 3  . folic acid (FOLVITE) 1 MG tablet Take 1 tablet (1 mg total) by mouth daily. 90 tablet 3  . gabapentin (NEURONTIN) 100 MG capsule Take 1 capsule (100 mg total) by mouth 2 (two) times daily. 180 capsule 1  . hydrOXYzine (ATARAX/VISTARIL) 25 MG tablet TAKE 1 TABLET (25 MG TOTAL) BY MOUTH 2 (TWO) TIMES DAILY AS NEEDED FOR ANXIETY. 180 tablet 0  . ipratropium-albuterol (DUONEB) 0.5-2.5 (3) MG/3ML SOLN SMARTSIG:3 Milliliter(s) Via Nebulizer Every 6 Hours    . LINZESS 290 MCG CAPS capsule TAKE 1 CAPSULE EVERY DAY AS NEEDED FOR CONSTIPATION AS DIRECTED 30 capsule 3  . meclizine (  ANTIVERT) 25 MG tablet Take 1 tablet (25 mg total) by mouth 3 (three) times daily as needed for dizziness. 30 tablet 0  . methocarbamol (ROBAXIN) 500 MG tablet Take 1 tablet (500 mg total) by mouth 3 (three) times daily as needed for muscle spasms (sedation precautions). 90 tablet 0  . mirtazapine (REMERON) 15 MG tablet TAKE 1 TABLET BY MOUTH EVERYDAY AT BEDTIME (Patient taking differently: Take 15 mg by mouth at bedtime. ) 90 tablet 3  . Multiple Vitamin (MULTIVITAMIN WITH MINERALS) TABS tablet Take 1 tablet by mouth daily.    . naproxen (NAPROSYN) 375 MG tablet Take 1 tablet (375 mg total) by mouth 2 (two) times daily as needed for moderate  pain. Take with food 40 tablet 0  . omeprazole (PRILOSEC) 40 MG capsule TAKE 1 CAPSULE BY MOUTH DAILY. FOR THREE WEEKS THEN AS NEEDED. TAKE 30 MIN PRIOR TO LARGE MEAL (Patient taking differently: Take 40 mg by mouth daily. ) 90 capsule 1  . OXYGEN Inhale 4 L/min into the lungs continuous.     . predniSONE (DELTASONE) 20 MG tablet Take 2 tablets (40 mg total) by mouth daily with breakfast. 10 tablet 0  . sertraline (ZOLOFT) 100 MG tablet Take 1.5 tablets (150 mg total) by mouth daily. 135 tablet 3  . SPIRIVA HANDIHALER 18 MCG inhalation capsule INHALE THE CONTENTS OF 1 CAPSULE  VIA  HANDIHALER DAILY 90 capsule 0  . thiamine 100 MG tablet Take 1 tablet (100 mg total) by mouth daily. 30 tablet 0   No facility-administered medications prior to visit.     Per HPI unless specifically indicated in ROS section below Review of Systems Objective:  BP 112/70 (BP Location: Right Arm, Patient Position: Sitting, Cuff Size: Normal)   Pulse 95   Temp 97.7 F (36.5 C) (Temporal)   Ht 5' 4.5" (1.638 m)   Wt 113 lb (51.3 kg)   SpO2 92% Comment: 3 L  BMI 19.10 kg/m   Wt Readings from Last 3 Encounters:  03/13/20 113 lb (51.3 kg)  02/08/20 118 lb (53.5 kg)  01/28/20 118 lb (53.5 kg)      Physical Exam Vitals and nursing note reviewed.  Constitutional:      General: She is not in acute distress.    Appearance: Normal appearance.     Comments:  [place] in place Chronically frail appearing  Neck:     Comments: No midline cervical neck pain  Cardiovascular:     Rate and Rhythm: Normal rate and regular rhythm.     Pulses: Normal pulses.     Heart sounds: Normal heart sounds.  Pulmonary:     Effort: Pulmonary effort is normal. No respiratory distress.     Breath sounds: Wheezing (coarse) and rhonchi (throughout) present. No rales.  Musculoskeletal:        General: Normal range of motion.     Cervical back: Normal range of motion. No rigidity.     Comments:  FROM at shoulders Point tender to  palpation at L rhomboids - reproduces tenderness she feels.  No midline thoracic spine tenderness  Lymphadenopathy:     Cervical: No cervical adenopathy.  Skin:    General: Skin is warm and dry.     Findings: No rash.  Neurological:     Mental Status: She is alert.  Psychiatric:        Mood and Affect: Mood normal.        Behavior: Behavior normal.       Assessment &  Plan:  This visit occurred during the SARS-CoV-2 public health emergency.  Safety protocols were in place, including screening questions prior to the visit, additional usage of staff PPE, and extensive cleaning of exam room while observing appropriate contact time as indicated for disinfecting solutions.   Problem List Items Addressed This Visit    Tremor    Anticipate multifactorial - chronic hypoxia, med related (wellbutrin and albuterol). No fmhx ET. She desires to continue current meds.       Tobacco abuse    Continued smoker despite end stage COPD.       Rhomboid muscle strain, subsequent encounter - Primary    Present since 10/2019, with fluctuating severity.  Recurrent and worsened. rec avoid NSAIDs, but instead continue tylenol, robaxin PRN, add voltaren gel OTC. Handout provided. She would not be able to do outpatient PT.       Palliative care status    DNR per palliative care - will review with patient at next OV.       Osteoporosis    Overdue for prolia. I will ask her to speak with Larene Beach about this.  I have placed orders for future labs (BMP) prior to next injection.       Relevant Orders   Basic metabolic panel   Moderate recurrent major depression (HCC)    Stable period on current regimen of zoloft 150mg , wellbutrin SR 150mg , remeron nightly and hydroxyzine PRN. Desires to continue this.       COPD, very severe ([place])    Stable period. Continues symbicort and spiriva daily.       Chronic respiratory failure with hypoxia, on home O2 therapy (HCC)   Chronic constipation    Taking linzess  for CIC - notes ongoing trouble with irregular BMs - about 2 a week, and diarrhea when she has one. Suggested trial of miralax 1/2 capful daily but if diarrhea worsens, to stop this.       Relevant Medications   polyethylene glycol powder (GLYCOLAX/MIRALAX) 17 GM/SCOOP powder    Other Visit Diagnoses    Need for influenza vaccination       Relevant Orders   Flu Vaccine QUAD 36+ mos IM (Completed)       Meds ordered this encounter  Medications  . polyethylene glycol powder (GLYCOLAX/MIRALAX) 17 GM/SCOOP powder    Sig: Take 17 g by mouth daily as needed for moderate constipation.    Dispense:  3350 g    Refill:  1   Orders Placed This Encounter  Procedures  . Flu Vaccine QUAD 36+ mos IM  . Basic metabolic panel    Standing Status:   Future    Standing Expiration Date:   03/13/2021    Patient Instructions  Flu shot today  You are overdue for prolia injection. See Larene Beach to discuss this today.  For constipation - start miralax (polyethylene glycol) 1/2-1 capful daily with 8 oz glass of water.  Call Dr Patsey Berthold to reschedule appointment.  For back - I think you have left sided rhomboid strain. Do exercises provided today, stop ibuprofen (only use tylenol or acetaminophen), may use topical medicine like aspercream or voltaren (diclofenac) anti inflammatory gel. Ok to use robaxin sparingly. Let us know how you do with this. If no better, we could consider PT referral.    Follow up plan: No follow-ups on file.  Ria Bush, MD

## 2020-03-13 NOTE — Telephone Encounter (Signed)
Never received paperwork. Spoke with pt at [place] today.

## 2020-03-13 NOTE — Assessment & Plan Note (Signed)
Present since 10/2019, with fluctuating severity.  Recurrent and worsened. rec avoid NSAIDs, but instead continue tylenol, robaxin PRN, add voltaren gel OTC. Handout provided. She would not be able to do outpatient PT.

## 2020-03-13 NOTE — Patient Instructions (Addendum)
Flu shot today  You are overdue for prolia injection. See Larene Beach to discuss this today.  For constipation - start miralax (polyethylene glycol) 1/2-1 capful daily with 8 oz glass of water.  Call Dr Patsey Berthold to reschedule appointment.  For back - I think you have left sided rhomboid strain. Do exercises provided today, stop ibuprofen (only use tylenol or acetaminophen), may use topical medicine like aspercream or voltaren (diclofenac) anti inflammatory gel. Ok to use robaxin sparingly. Let us know how you do with this. If no better, we could consider PT referral.

## 2020-03-13 NOTE — Assessment & Plan Note (Signed)
Stable period. Continues symbicort and spiriva daily.

## 2020-03-13 NOTE — Assessment & Plan Note (Signed)
Stable period on current regimen of zoloft 150mg , wellbutrin SR 150mg , remeron nightly and hydroxyzine PRN. Desires to continue this.

## 2020-03-13 NOTE — Telephone Encounter (Signed)
Pt in office to for OV. Advised pt she is overdue for prolia but sine her new insurance her benefits would need to be rerun to see if PA is needed and if cost would change for her. Advised this would take approximately 2 wks and then this nurse would f/u with her to reschedule labs and inj. Pt verbalized understanding.

## 2020-03-13 NOTE — Assessment & Plan Note (Signed)
Taking linzess for CIC - notes ongoing trouble with irregular BMs - about 2 a week, and diarrhea when she has one. Suggested trial of miralax 1/2 capful daily but if diarrhea worsens, to stop this.

## 2020-03-13 NOTE — Assessment & Plan Note (Signed)
DNR per palliative care - will review with patient at next OV.

## 2020-03-13 NOTE — Assessment & Plan Note (Signed)
Anticipate multifactorial - chronic hypoxia, med related (wellbutrin and albuterol). No fmhx ET. She desires to continue current meds.

## 2020-03-13 NOTE — Telephone Encounter (Signed)
Med was filled on 01/28/20 at [facility] f/u appt. #90 tabs with 0 refills, pt also had an appt today with PCP

## 2020-03-17 NOTE — Telephone Encounter (Signed)
patient is scheduled

## 2020-03-17 NOTE — Telephone Encounter (Signed)
Noted  

## 2020-03-20 NOTE — Telephone Encounter (Signed)
Left message to call office with updated insurance information so that we can run benefits.

## 2020-03-22 ENCOUNTER — Telehealth: Payer: Self-pay | Admitting: Adult Health Nurse Practitioner

## 2020-03-22 NOTE — Telephone Encounter (Signed)
Patient had inquired about in home help for house cleaning for low income families.  SW did not have any organizations that helped with house cleaning for those with low income.  Called to talk to patient about this.  Left VM with reason for call and call back info. Roman Sandall K. Olena Heckle NP

## 2020-03-25 ENCOUNTER — Other Ambulatory Visit: Payer: Self-pay | Admitting: Family Medicine

## 2020-03-27 NOTE — Telephone Encounter (Signed)
Robaxin last filled 03/13/20, #90 Naproxen last filled 03/15/20, #40 Last OV:  03/13/20, L shoulder pain Next OV:  06/06/20, AWV prt 2

## 2020-03-30 NOTE — Telephone Encounter (Signed)
Contacted pt and she will bring by new insurance card today.

## 2020-03-30 NOTE — Telephone Encounter (Signed)
Called patient I have updated her insurance information in [place]. Benefits have been submitted. Patient aware we will contact after received.

## 2020-04-06 NOTE — Telephone Encounter (Addendum)
[employer] called stating there is no packet that would be faxed to [place] just an authorization from her PCP. I advised him that I am sure there is more that is needed than just a rx. He said the Dr has to send a rx to the DME supplier with the proper diagnosis. I asked that he find out who the preferred DME supplier for [facility] was. He will call back. Supposedly, pt will just need to take a rx to the DME supplier with a rx.  The preferred DME is [employer]. Ph: [phone] Fax: [phone]

## 2020-04-11 ENCOUNTER — Encounter: Payer: Self-pay | Admitting: Family Medicine

## 2020-04-11 ENCOUNTER — Other Ambulatory Visit: Payer: Self-pay

## 2020-04-11 ENCOUNTER — Ambulatory Visit (INDEPENDENT_AMBULATORY_CARE_PROVIDER_SITE_OTHER): Payer: Medicare Other | Admitting: Family Medicine

## 2020-04-11 VITALS — BP 108/60 | HR 116 | Temp 97.6°F | Ht 64.5 in | Wt 110.5 lb

## 2020-04-11 DIAGNOSIS — Z7409 Other reduced mobility: Secondary | ICD-10-CM | POA: Diagnosis not present

## 2020-04-11 DIAGNOSIS — J9611 Chronic respiratory failure with hypoxia: Secondary | ICD-10-CM

## 2020-04-11 DIAGNOSIS — J441 Chronic obstructive pulmonary disease with (acute) exacerbation: Secondary | ICD-10-CM | POA: Diagnosis not present

## 2020-04-11 DIAGNOSIS — Z9981 Dependence on supplemental oxygen: Secondary | ICD-10-CM | POA: Diagnosis not present

## 2020-04-11 DIAGNOSIS — J449 Chronic obstructive pulmonary disease, unspecified: Secondary | ICD-10-CM | POA: Diagnosis not present

## 2020-04-11 MED ORDER — PREDNISONE 20 MG PO TABS
40.0000 mg | ORAL_TABLET | Freq: Every day | ORAL | 0 refills | Status: DC
Start: 2020-04-11 — End: 2020-06-06

## 2020-04-11 MED ORDER — DOXYCYCLINE HYCLATE 100 MG PO TABS
100.0000 mg | ORAL_TABLET | Freq: Two times a day (BID) | ORAL | 0 refills | Status: DC
Start: 2020-04-11 — End: 2020-06-06

## 2020-04-11 NOTE — Assessment & Plan Note (Addendum)
Mobility assessment completed. Rx for power scooter written and will fax to preferred DME supply store. Unable to use other ambulatory device modalities including cane, walker, manual wheelchair.

## 2020-04-11 NOTE — Progress Notes (Signed)
This visit was conducted in person.  BP 108/60 (BP Location: Left Arm, Patient Position: Sitting, Cuff Size: Normal)   Pulse (!) 116   Temp 97.6 F (36.4 C) (Temporal)   Ht 5' 4.5" (1.638 m)   Wt 110 lb 8 oz (50.1 kg)   SpO2 95% Comment: 4 L  BMI 18.67 kg/m    CC: discuss scooter - mobility evaluation Subjective:    Patient ID: Christine Serrano, female    DOB: 1960-10-06, 59 y.o.   MRN: [id number]  HPI: PALMIRA STICKLE is a 59 y.o. female presenting on 04/11/2020 for Other (Here for face-to-face to discuss getting a scooter.) and Fatigue (C/o feeling weak and loss of appetite.  Started a few days ago. )   Here again to review scooter eligibility.  Needs Rx sent to: [employer]. Ph: [phone] Fax: [place] [employer]. We never received any request from insurance.   Very severe COPD - was doing better since restarting daily controller meds (symbicort and spiriva). Continued smoker 2.5 ppd dropped to 1ppd. Using albuterol nebulizer 2-3 times a day, inhaler 1-2 times a day. Last neb treatment was last night overnight. Today not breathing very well - increased dyspnea for last several days. Increased cough, sputum production, increased dyspnea. Unknown trigger. No fevers/chills, abd pain, chest pain. Last prednisone course for COPD exac was end of August. Notes increased shaking as well.   -------------------------------------------------------------- Ambulation is severely limited by chronic respiratory failure - she is unable to walk 50 feet without stopping to rest due to dyspnea. This limits her MRADLs. She is able to use hands to guide scooter. Cane/walker are not enough to prevent falls due to generalized weakness from deconditioning. Unable to use manual wheelchair because of easy arm strength fatigability - arms quickly give out whenever exerting.   No paresthesias of feet.  She lives in a 1 story house with 1 step to get into the  house, has hard floor at home.  She does not have ramp but is planning to install one.   She is able to safely get on and off scooter.  She is able to operate the steering system.  She is able to maintain postural stability and position while operating scooter.  She has mental and physical capacity to operate scooter.  She endorses her home provides adequate access between the rooms, maneuvering space, surfaces for operating POV. Unable to fit wheelchair in the home.  Using a POV will significantly improve her ability to participate in MRADLs, she will use POV in the home.      Relevant past medical, surgical, family and social history reviewed and updated as indicated. Interim medical history since our last visit reviewed. Allergies and medications reviewed and updated. Outpatient Medications Prior to Visit  Medication Sig Dispense Refill  . albuterol (PROVENTIL) (2.5 MG/3ML) 0.083% nebulizer solution Take 3 mLs (2.5 mg total) by nebulization every 2 (two) hours as needed for wheezing. 75 mL 12  . Ascorbic Acid (VITAMIN C WITH ROSE HIPS) 500 MG tablet Take 500 mg by mouth daily.    Marland Kitchen aspirin EC 81 MG tablet Take 81 mg by mouth daily.    . benzonatate (TESSALON) 100 MG capsule Take 1 capsule (100 mg total) by mouth 3 (three) times daily as needed for cough. 30 capsule 3  . budesonide-formoterol (SYMBICORT) 160-4.5 MCG/ACT inhaler Inhale 2 puffs into the lungs 2 (two) times daily. 3 Inhaler 0  . buPROPion (WELLBUTRIN XL)  150 MG 24 hr tablet Take 1 tablet (150 mg total) by mouth daily. 90 tablet 1  . cholecalciferol (VITAMIN D3) 25 MCG (1000 UNIT) tablet Take 1,000 Units by mouth daily.    . fluticasone (FLONASE) 50 MCG/ACT nasal spray Place 2 sprays into both nostrils daily. (Patient taking differently: Place 2 sprays into both nostrils daily as needed for allergies or rhinitis. ) 48 g 3  . folic acid (FOLVITE) 1 MG tablet Take 1 tablet (1 mg total) by mouth daily. 90 tablet 3  . gabapentin  (NEURONTIN) 100 MG capsule Take 1 capsule (100 mg total) by mouth 2 (two) times daily. 180 capsule 1  . hydrOXYzine (ATARAX/VISTARIL) 25 MG tablet TAKE 1 TABLET (25 MG TOTAL) BY MOUTH 2 (TWO) TIMES DAILY AS NEEDED FOR ANXIETY. 180 tablet 0  . ipratropium-albuterol (DUONEB) 0.5-2.5 (3) MG/3ML SOLN SMARTSIG:3 Milliliter(s) Via Nebulizer Every 6 Hours    . LINZESS 290 MCG CAPS capsule TAKE 1 CAPSULE EVERY DAY AS NEEDED FOR CONSTIPATION AS DIRECTED 30 capsule 3  . meclizine (ANTIVERT) 25 MG tablet Take 1 tablet (25 mg total) by mouth 3 (three) times daily as needed for dizziness. 30 tablet 0  . methocarbamol (ROBAXIN) 500 MG tablet TAKE 1 TABLET BY MOUTH 3 TIMES DAILY AS NEEDED FOR MUSCLE SPASMS (SEDATION PRECAUTIONS). 90 tablet 0  . mirtazapine (REMERON) 15 MG tablet TAKE 1 TABLET BY MOUTH EVERYDAY AT BEDTIME (Patient taking differently: Take 15 mg by mouth at bedtime. ) 90 tablet 3  . Multiple Vitamin (MULTIVITAMIN WITH MINERALS) TABS tablet Take 1 tablet by mouth daily.    . naproxen (NAPROSYN) 375 MG tablet TAKE 1 TABLET BY MOUTH TWICE A DAY AS NEEDED FOR PAIN WITH FOOD 40 tablet 0  . omeprazole (PRILOSEC) 40 MG capsule TAKE 1 CAPSULE BY MOUTH DAILY. FOR THREE WEEKS THEN AS NEEDED. TAKE 30 MIN PRIOR TO LARGE MEAL (Patient taking differently: Take 40 mg by mouth daily. ) 90 capsule 1  . OXYGEN Inhale 4 L/min into the lungs continuous.     . polyethylene glycol powder (GLYCOLAX/MIRALAX) 17 GM/SCOOP powder Take 17 g by mouth daily as needed for moderate constipation. 3350 g 1  . sertraline (ZOLOFT) 100 MG tablet Take 1.5 tablets (150 mg total) by mouth daily. 135 tablet 3  . SPIRIVA HANDIHALER 18 MCG inhalation capsule INHALE THE CONTENTS OF 1 CAPSULE  VIA  HANDIHALER DAILY 90 capsule 0  . thiamine 100 MG tablet Take 1 tablet (100 mg total) by mouth daily. 30 tablet 0  . doxycycline (VIBRA-TABS) 100 MG tablet Take 1 tablet (100 mg total) by mouth 2 (two) times daily. 14 tablet 0  . predniSONE  (DELTASONE) 20 MG tablet Take 2 tablets (40 mg total) by mouth daily with breakfast. 10 tablet 0   No facility-administered medications prior to visit.     Per HPI unless specifically indicated in ROS section below Review of Systems Objective:  BP 108/60 (BP Location: Left Arm, Patient Position: Sitting, Cuff Size: Normal)   Pulse (!) 116   Temp 97.6 F (36.4 C) (Temporal)   Ht 5' 4.5" (1.638 m)   Wt 110 lb 8 oz (50.1 kg)   SpO2 95% Comment: 4 L  BMI 18.67 kg/m   Wt Readings from Last 3 Encounters:  04/11/20 110 lb 8 oz (50.1 kg)  03/13/20 113 lb (51.3 kg)  02/08/20 118 lb (53.5 kg)      Physical Exam Vitals and nursing note reviewed.  Constitutional:  Appearance: Normal appearance. She is ill-appearing (chronic).     Comments:  in place with oxygen at 4L   Cardiovascular:     Rate and Rhythm: Regular rhythm. Tachycardia present.     Pulses: Normal pulses.     Heart sounds: Normal heart sounds. No murmur heard.   Pulmonary:     Effort: Pulmonary effort is normal. No respiratory distress.     Breath sounds: Wheezing (coarse R>L) present. No rhonchi or rales.  Musculoskeletal:     Right lower leg: No edema.     Left lower leg: No edema.  Skin:    General: Skin is warm and dry.     Findings: No rash.  Neurological:     General: No focal deficit present.     Mental Status: She is alert.     Motor: Weakness present.     Gait: Gait abnormal.     Deep Tendon Reflexes:     Reflex Scores:      Patellar reflexes are 2+ on the right side and 2+ on the left side.    Comments:  CN grossly intact Grip strength intact 5/5 strength BUE 4-5/5 strength BLE: decreased strength on testing of hip and knee flexors R>L  Unsteady gait  Psychiatric:        Mood and Affect: Mood normal.        Behavior: Behavior normal.        Assessment & Plan:  This visit occurred during the SARS-CoV-2 public health emergency.  Safety protocols were in place, including screening  questions prior to the visit, additional usage of staff PPE, and extensive cleaning of exam room while observing appropriate contact time as indicated for disinfecting solutions.   Problem List Items Addressed This Visit    Impaired mobility - Primary    Mobility assessment completed. Rx for power scooter written and will fax to preferred DME supply store. Unable to use other ambulatory device modalities including cane, walker, manual wheelchair.       COPD, very severe ([place])    Continue controller meds. Has upcoming pulm appt next week.       Relevant Medications   predniSONE (DELTASONE) 20 MG tablet   COPD exacerbation ([facility])    Increased cough, dyspnea, sputum production. Treat as COPD exac with doxy 1 wk course and prednisone burst. Update if not improving with treatment.       Relevant Medications   predniSONE (DELTASONE) 20 MG tablet   Chronic respiratory failure with hypoxia, on home O2 therapy ([place])       Meds ordered this encounter  Medications  . doxycycline (VIBRA-TABS) 100 MG tablet    Sig: Take 1 tablet (100 mg total) by mouth 2 (two) times daily.    Dispense:  14 tablet    Refill:  0  . predniSONE (DELTASONE) 20 MG tablet    Sig: Take 2 tablets (40 mg total) by mouth daily with breakfast.    Dispense:  10 tablet    Refill:  0   No orders of the defined types were placed in this encounter.   Patient Instructions  For COPD flare - treat with prednisone 5 day course and doxycycline for 1 week twice daily.  For scooter - we will fax prescription to [facility] and await next step.   Follow up plan: Return if symptoms worsen or fail to improve.  Ria Bush, MD

## 2020-04-11 NOTE — Assessment & Plan Note (Signed)
Increased cough, dyspnea, sputum production. Treat as COPD exac with doxy 1 wk course and prednisone burst. Update if not improving with treatment.

## 2020-04-11 NOTE — Patient Instructions (Signed)
For COPD flare - treat with prednisone 5 day course and doxycycline for 1 week twice daily.  For scooter - we will fax prescription to [facility] and await next step.

## 2020-04-11 NOTE — Assessment & Plan Note (Signed)
Continue controller meds. Has upcoming pulm appt next week.

## 2020-04-11 NOTE — Telephone Encounter (Signed)
Seen today. 

## 2020-04-14 ENCOUNTER — Other Ambulatory Visit: Payer: Self-pay

## 2020-04-14 MED ORDER — DENOSUMAB 60 MG/ML ~~LOC~~ SOSY: 60.0000 mg | PREFILLED_SYRINGE | Freq: Once | SUBCUTANEOUS | 0 refills | Status: AC

## 2020-04-14 NOTE — Telephone Encounter (Signed)
  Benefits submitted: Yes Pt has only $6 copy if pick up from local pharmacy. Script has been sent in patient aware that needs to be put in fridge when picked up. Will bring with her to appointment   Prior authorization needed: No  Out of pocket for patient: prescription Co pay at pharmacy   Lab appointment : 06/28/2019 @ 11:45  Nurse visit:  04/27/2020@ 9:30  Lab order placed: Yes

## 2020-04-17 ENCOUNTER — Other Ambulatory Visit: Payer: Self-pay

## 2020-04-17 ENCOUNTER — Other Ambulatory Visit (INDEPENDENT_AMBULATORY_CARE_PROVIDER_SITE_OTHER): Payer: Medicare Other

## 2020-04-17 DIAGNOSIS — M810 Age-related osteoporosis without current pathological fracture: Secondary | ICD-10-CM

## 2020-04-17 LAB — BASIC METABOLIC PANEL
BUN: 6 mg/dL (ref 6–23)
CO2: 33 mEq/L — ABNORMAL HIGH (ref 19–32)
Calcium: 8.9 mg/dL (ref 8.4–10.5)
Chloride: 100 mEq/L (ref 96–112)
Creatinine, Ser: 0.56 mg/dL (ref 0.40–1.20)
GFR: 98.6 mL/min (ref >60.00)
Glucose, Bld: 90 mg/dL (ref 70–99)
Potassium: 3.8 mEq/L (ref 3.5–5.1)
Sodium: 139 mEq/L (ref 135–145)

## 2020-04-18 ENCOUNTER — Ambulatory Visit (INDEPENDENT_AMBULATORY_CARE_PROVIDER_SITE_OTHER): Payer: Medicare Other | Admitting: Pulmonary Disease

## 2020-04-18 ENCOUNTER — Encounter: Payer: Self-pay | Admitting: Pulmonary Disease

## 2020-04-18 VITALS — BP 120/68 | HR 100 | Temp 97.8°F | Ht 64.0 in | Wt 109.4 lb

## 2020-04-18 DIAGNOSIS — F17210 Nicotine dependence, cigarettes, uncomplicated: Secondary | ICD-10-CM

## 2020-04-18 DIAGNOSIS — R64 Cachexia: Secondary | ICD-10-CM

## 2020-04-18 DIAGNOSIS — Z9981 Dependence on supplemental oxygen: Secondary | ICD-10-CM | POA: Diagnosis not present

## 2020-04-18 DIAGNOSIS — J449 Chronic obstructive pulmonary disease, unspecified: Secondary | ICD-10-CM | POA: Diagnosis not present

## 2020-04-18 DIAGNOSIS — J9611 Chronic respiratory failure with hypoxia: Secondary | ICD-10-CM

## 2020-04-18 DIAGNOSIS — R918 Other nonspecific abnormal finding of lung field: Secondary | ICD-10-CM

## 2020-04-18 MED ORDER — BREZTRI AEROSPHERE 160-9-4.8 MCG/ACT IN AERO: 2.0000 | INHALATION_SPRAY | Freq: Two times a day (BID) | RESPIRATORY_TRACT | 0 refills | Status: DC

## 2020-04-18 NOTE — Progress Notes (Signed)
Subjective:    Patient ID: Christine Serrano, female    DOB: Sep 01, 1960, 59 y.o.   MRN: [id number]  PT PROFILE: 59 year old F recalcitrant smoker with severe, oxygen-dependent COPD hospitalizedmultiple timesfor COPD exacerbation initially requiring NIPPVin 2017, former patient of Dr. Alva Garnet.  On oxygen at 4 L/min chronically.  PROBLEMS: Recalcitrant smoker Chronic hypoxic respiratory failure Very severeCOPD/emphysema Chronic bronchitis  DATA: CT chest 03/13/16:Severe emphysema with extensive disease in the upper lobes and superior segment of the right lower lobe. This could represent acute and/or chronic changes. In addition, there is an irregular spiculated density along the right minor fissure which is indeterminate. PFTs 01/15/18: FVC: 1.42 L (48 %pred), FEV1: 0.76 L (31 %pred), FEV1/FVC: 53% , TLC: 4.49 L (94 %pred), DLCO 30 %pred, DLCO/VA 51%. TLC likely underestimated by gas dilution technique. Severe Gas trappingwith RV at 172% predicted. CT chest 10/13/2019: Severe emphysema particularly upper lobes with upper lobe predominant scarring and architectural distortion no significant change from November 25, 2016 CT. Coronary artery calcifications and aortic calcification was noted. Chest x-ray 10/16/2019:Hyperinflated lungs with diffuse interstitial prominence, no acute infiltrate..  INTERVAL:  Last seen08/10/2021by Rexene Edison, NP. That was a televisit.  He had been hospitalized with COPD exacerbation  on 12/05/2019 and 01/04/2020.  No exacerbations since that visit.  HPI Patient is a 59 year old current smoker (1 PPD) with known COPD and chronic hypoxic respiratory failure on oxygen therapy.  She has frequent exacerbations during the year.  She has not had any exacerbations since her tele visit with Rexene Edison, NP in August.  She continues to smoke 1 pack of cigarettes per day.  She complains of weight loss or inability to gain weight.  She has her usual cough productive of  clear to whitish sputum in the mornings.  No hemoptysis.  Chronic orthopnea with no change in character.  Dyspnea perhaps more marked.  No lower extremity edema.  Voices no other complaint.  No fevers, chills or sweats.   Review of Systems A 10 point review of systems was performed and it is as noted above otherwise negative.  Patient Active Problem List   Diagnosis Date Noted  . Low serum vitamin B12 06/09/2020  . Cervical neck pain with evidence of disc disease 01/29/2020  . Rhomboid muscle strain, subsequent encounter 11/10/2019  . Palliative care status 10/23/2019  . Impaired mobility 10/20/2019  . Chronic respiratory failure with hypoxia, on home O2 therapy ([place]) 10/20/2019  . Memory difficulties 10/16/2019  . Folate deficiency 09/17/2019  . Dizziness 09/11/2019  . Anemia 09/11/2019  . Hypocalcemia 09/11/2019  . Systolic murmur 79/39/0300  . Vertebral artery stenosis, left 02/27/2019  . Medicare annual wellness visit, subsequent 02/25/2019  . Advanced directives, counseling/discussion 02/25/2019  . Epistaxis 09/21/2018  . COPD exacerbation ([place]) 09/02/2018  . Osteoporosis 05/23/2018  . Numbness of right hand 10/10/2017  . Nasal sinus congestion 10/10/2017  . Benign neoplasm of ascending colon   . Benign neoplasm of transverse colon   . Benign neoplasm of sigmoid colon   . Positive hepatitis C antibody test 02/23/2017  . Encounter for general adult medical examination with abnormal findings 02/21/2017  . HLD (hyperlipidemia) 02/21/2017  . Anxiety 12/10/2016  . Tobacco abuse 11/28/2016  . Aortic atherosclerosis ([place]) 11/26/2016  . Tachycardia 11/01/2016  . Pulmonary nodule 10/25/2016  . Moderate recurrent major depression ([place]) 07/03/2016  . Chronic constipation 07/03/2016  . Alcohol abuse, in remission 07/03/2016  . COPD, very severe ([place]) 07/03/2016  . Tremor 07/03/2016  .  Chronic insomnia 07/03/2016     Allergies  Allergen Reactions  . Terbinafine And Related  Diarrhea    Diarrheal illness while on terbinafine ?related to med  . Imitrex [Sumatriptan] Other (See Comments)    Worsens migraine    Current Meds  Medication Sig  . albuterol (PROVENTIL) (2.5 MG/3ML) 0.083% nebulizer solution Take 2.5 mg by nebulization every 6 (six) hours as needed for wheezing or shortness of breath.  . Ascorbic Acid (VITAMIN C WITH ROSE HIPS) 500 MG tablet Take 500 mg by mouth daily.  Marland Kitchen aspirin EC 81 MG tablet Take 81 mg by mouth daily.  . benzonatate (TESSALON) 100 MG capsule Take 1 capsule (100 mg total) by mouth 3 (three) times daily as needed for cough.  . budesonide-formoterol (SYMBICORT) 160-4.5 MCG/ACT inhaler Inhale 2 puffs into the lungs 2 (two) times daily.  Marland Kitchen buPROPion (WELLBUTRIN XL) 150 MG 24 hr tablet Take 1 tablet (150 mg total) by mouth daily.  . cholecalciferol (VITAMIN D3) 25 MCG (1000 UNIT) tablet Take 1,000 Units by mouth daily.  Marland Kitchen doxycycline (VIBRA-TABS) 100 MG tablet Take 1 tablet (100 mg total) by mouth 2 (two) times daily.  . fluticasone (FLONASE) 50 MCG/ACT nasal spray Place 2 sprays into both nostrils daily. (Patient taking differently: Place 2 sprays into both nostrils daily as needed for allergies or rhinitis. )  . folic acid (FOLVITE) 1 MG tablet Take 1 tablet (1 mg total) by mouth daily.  Marland Kitchen gabapentin (NEURONTIN) 100 MG capsule Take 1 capsule (100 mg total) by mouth 2 (two) times daily.  . hydrOXYzine (ATARAX/VISTARIL) 25 MG tablet TAKE 1 TABLET (25 MG TOTAL) BY MOUTH 2 (TWO) TIMES DAILY AS NEEDED FOR ANXIETY.  Marland Kitchen LINZESS 290 MCG CAPS capsule TAKE 1 CAPSULE EVERY DAY AS NEEDED FOR CONSTIPATION AS DIRECTED  . meclizine (ANTIVERT) 25 MG tablet Take 1 tablet (25 mg total) by mouth 3 (three) times daily as needed for dizziness.  . methocarbamol (ROBAXIN) 500 MG tablet TAKE 1 TABLET BY MOUTH 3 TIMES DAILY AS NEEDED FOR MUSCLE SPASMS (SEDATION PRECAUTIONS).  . mirtazapine (REMERON) 15 MG tablet TAKE 1 TABLET BY MOUTH EVERYDAY AT BEDTIME (Patient  taking differently: Take 15 mg by mouth at bedtime. )  . Multiple Vitamin (MULTIVITAMIN WITH MINERALS) TABS tablet Take 1 tablet by mouth daily.  . naproxen (NAPROSYN) 375 MG tablet TAKE 1 TABLET BY MOUTH TWICE A DAY AS NEEDED FOR PAIN WITH FOOD  . omeprazole (PRILOSEC) 40 MG capsule TAKE 1 CAPSULE BY MOUTH DAILY. FOR THREE WEEKS THEN AS NEEDED. TAKE 30 MIN PRIOR TO LARGE MEAL (Patient taking differently: Take 40 mg by mouth daily. )  . OXYGEN Inhale 4 L/min into the lungs continuous.   . polyethylene glycol powder (GLYCOLAX/MIRALAX) 17 GM/SCOOP powder Take 17 g by mouth daily as needed for moderate constipation.  . predniSONE (DELTASONE) 20 MG tablet Take 2 tablets (40 mg total) by mouth daily with breakfast.  . sertraline (ZOLOFT) 100 MG tablet Take 1.5 tablets (150 mg total) by mouth daily.  Marland Kitchen SPIRIVA HANDIHALER 18 MCG inhalation capsule INHALE THE CONTENTS OF 1 CAPSULE  VIA  HANDIHALER DAILY  . thiamine 100 MG tablet Take 1 tablet (100 mg total) by mouth daily.  . [DISCONTINUED] ipratropium-albuterol (DUONEB) 0.5-2.5 (3) MG/3ML SOLN SMARTSIG:3 Milliliter(s) Via Nebulizer Every 6 Hours   Immunization History  Administered Date(s) Administered  . Influenza Split 04/17/2016  . Influenza,inj,Quad PF,6+ Mos 02/21/2017, 03/17/2018, 02/25/2019, 03/13/2020  . Pneumococcal Conjugate-13 05/11/2018  . Pneumococcal Polysaccharide-23 06/17/2008  Social History   Tobacco Use  . Smoking status: Current Every Day Smoker    Packs/day: 3.00    Years: 51.00    Pack years: 153.00    Types: Cigarettes  . Smokeless tobacco: Never Used  . Tobacco comment: 1PPD 04/18/20   Substance Use Topics  . Alcohol use: Yes    Comment: occasional       Objective:   Physical Exam BP 120/68 (BP Location: Left Arm, Cuff Size: Normal)   Pulse 100   Temp 97.8 F (36.6 C) (Temporal)   Ht 5\' 4"  (1.626 m)   Wt 109 lb 6.4 oz (49.6 kg)   SpO2 94%   BMI 18.78 kg/m  O2 at 4 L/min via nasal cannula. GENERAL:  Thin/cachectic woman, debilitated, looks older than stated age, presents in transport chair. HEAD: Normocephalic, atraumatic.  EYES: Pupils equal, round, reactive to light.  No scleral icterus.  MOUTH: Nose/mouth/throat not examined due to masking requirements for COVID 19. NECK: Supple. No thyromegaly. Trachea midline. No JVD.  No adenopathy. PULMONARY: Distant breath sounds.  Rhonchi, no other adventitious sounds. CARDIOVASCULAR: S1 and S2. Regular rate and rhythm.  No rubs, murmurs or gallops heard. ABDOMEN: Scaphoid, otherwise benign.   MUSCULOSKELETAL: No joint deformity, no clubbing, no edema.  NEUROLOGIC: No overt focal deficit.  Speech is fluent.  Gait not observed.   SKIN: Intact,warm,dry.  On limited exam no rashes. PSYCH: Mood and behavior normal.     Assessment & Plan:     ICD-10-CM   1. COPD, very severe ([place])  J44.9    We will give a trial of Breztri 2 puffs twice a day Discontinue Symbicort and Spiriva QUIT SMOKING!   2. Abnormal CT lung screening  R91.8 CT CHEST LCS NODULE F/U W/O CONTRAST    CANCELED: CT CHEST WO CONTRAST   Has had prior abnormal lung CT screening Not a candidate for invasive procedures She would like to know however if there is malignancy  3. Chronic respiratory failure with hypoxia, on home O2 therapy ([facility])  J96.11    Z99.81    Continue oxygen at 4 L/min  4. Tobacco dependence due to cigarettes  F17.210    Patient counseled regards to discontinuation of smoking Total counseling time 3 to 5 minutes  5. 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 CT CHEST WO CONTRAST    Standing Status:   Future    Standing Expiration Date:   04/18/2021    Scheduling Instructions:     Next available.    Order Specific Question:   Preferred imaging location?    Answer:   [facility] ordered this encounter  Medications  .  Budeson-Glycopyrrol-Formoterol (BREZTRI AEROSPHERE) 160-9-4.8 MCG/ACT AERO    Sig: Inhale 2 puffs into the lungs in the morning and at bedtime.    Dispense:  10.7 g    Refill:  0    Order Specific Question:   Lot Number?    Answer:   [id number]    Order Specific Question:   Expiration Date?    Answer:   10/15/2021    Order Specific Question:   Manufacturer?    Answer:   [employer] [[id number]]    Order Specific Question:   Quantity    Answer:   2    Discussion:  Patient presents today with complaint of  weight loss or inability to gain weight.  I suspect this is due to pulmonary cachexia.  She has had abnormal lung cancer screening previously however the abnormalities are due to chronic lung disease.  However, she would like to know if there is any evidence of malignancy.  She understands she is not a candidate for invasive procedures and options for treatment would be limited if malignancy is found due to her advanced COPD.  Over the last, she would like to know if there is any malignancy.  Will obtain chest CT to evaluate this issue.  We will switch her inhaler therapy to Breztri 2 puffs twice a day.  She was instructed on the proper use of metered-dose inhaler.  She is to rinse her mouth well after use.  We will see her in follow-up in 2 months time she is to contact us prior to that time should any new difficulties arise.  Renold Don, MD [facility]   *This note was dictated using voice recognition software/Dragon.  Despite best efforts to proofread, errors can occur which can change the meaning.  Any change was purely unintentional.

## 2020-04-18 NOTE — Patient Instructions (Signed)
We are going to give you a trial of Breztri 2 puffs twice a day.  Do not use your Symbicort or Spiriva while taking this medication.  Let us know how this medication works for you.   We are going to order a CT scan of the chest.   We will see him in follow-up in 2 months time call sooner should any new difficulties arise.

## 2020-04-19 NOTE — Telephone Encounter (Signed)
CaCl is normal at 82.61 and Ca is in normal range. Ok for pt to have prolia inj on 04/27/20

## 2020-04-20 ENCOUNTER — Other Ambulatory Visit: Payer: Self-pay

## 2020-04-20 ENCOUNTER — Other Ambulatory Visit: Payer: Medicare Other | Admitting: Adult Health Nurse Practitioner

## 2020-04-20 DIAGNOSIS — F339 Major depressive disorder, recurrent, unspecified: Secondary | ICD-10-CM

## 2020-04-20 DIAGNOSIS — Z9981 Dependence on supplemental oxygen: Secondary | ICD-10-CM | POA: Diagnosis not present

## 2020-04-20 DIAGNOSIS — Z515 Encounter for palliative care: Secondary | ICD-10-CM | POA: Diagnosis not present

## 2020-04-20 DIAGNOSIS — J9611 Chronic respiratory failure with hypoxia: Secondary | ICD-10-CM

## 2020-04-20 NOTE — Progress Notes (Signed)
[facility] Consult Note Telephone: [phone]  Fax: [phone]  PATIENT NAME: Christine Serrano DOB: 30-Dec-1960 MRN: [id number]  PRIMARY CARE PROVIDER:   Ria Bush, MD  REFERRING PROVIDER:  Ria Bush, MD [place],  [place] [address]  RESPONSIBLE PARTY:  Self [phone] [name] SO/HCPOA [phone]   RECOMMENDATIONS and PLAN: 1.Advanced care planning. Patient is DNR  2.  COPD.  She does sleep with head of the bed elevated and with two pillows, this is baseline. She is independent of ADLs but has to take frequent rest breaks due to dyspnea.   Treated recently for COPD exacerbation, see below.  States that recent visit with pulmonologist hospice was recommended.  3.  Depression.  Patient states that she had stopped her Remeron as she felt like it was not helping her to sleep at night and restarted her trazodone.  Patient has been losing appetite and has gone from 118 pounds to 109 pounds.  Have encouraged her to restart her Remeron.  Patient's depression has worsened she has recently sold her 2 horses and she has been remembering the loss of several family members and friends who have passed away in the past year.  The news of a concerning spot on her lung and pulmonologist recommending hospice has increased her depression.  States that she used to see Dr. Bella Kennedy for mental health issues.  I have reached out to her PCP to get a referral to start services with him again.  Have encouraged her to restart services with Dr. Maryan Rued to help her through this depression.  Denies fever, headaches, pain, dizziness, N/V/D, constipation. Palliative will continue to monitor for symptom management/decline and make recommendations as needed. Have appointment in 4 weeks. Encouraged to call with any questions or concerns  I spent 60 minutes providing this consultation,  from 1:00 to 2:00 including time  spent with patient/family, chart review, chart review, documentation. More than 50% of the time in this consultation was spent coordinating communication.   HISTORY OF PRESENT ILLNESS:  Christine Serrano is a 59 y.o. year old female with multiple medical problems including severe COPD, HLD, depression. Palliative Care was asked to help address goals of care.  At end of October patient was treated by PCP for COPD exacerbation with prednisone taper and doxycycline.  On 04/18/2020 patient started Breztri trial and stopped her other inhalers.  She just started using this today so uncertain of how effective is going to be at this time.  Patient is upset today as spot on her lung has grown concerning.  She is scheduled for CT scan on 04/28/2020.  Patient states she was supposed to get an electric wheelchair but when they came to deliver it it would not fit through the doorways in the home and was hard to maneuver in the home.  Due to this insurance is now approved for patient to get a scooter.  PCP has sent an order to [facility] medical supply for this.  CODE STATUS: DNR  PPS: 40% HOSPICE ELIGIBILITY/DIAGNOSIS: TBD  PHYSICAL EXAM:   General: NAD, frail appearing, thin Extremities: no edema, no joint deformities Skin: no rashes on exposed skin Neurological: Weakness but otherwise nonfocal   PAST MEDICAL HISTORY:  Past Medical History:  Diagnosis Date   Cervical cancer ([place])    Chronic idiopathic constipation    Community acquired pneumonia of right upper lobe of lung 12/10/2016   COPD (chronic obstructive pulmonary disease) ([place])  Depression    Endometriosis    HLD (hyperlipidemia)    Smoker     SOCIAL HX:  Social History   Tobacco Use   Smoking status: Current Every Day Smoker    Packs/day: 3.00    Years: 51.00    Pack years: 153.00    Types: Cigarettes   Smokeless tobacco: Never Used   Tobacco comment: 1PPD 04/18/20   Substance Use Topics   Alcohol use: Yes    Comment:  occasional    ALLERGIES:  Allergies  Allergen Reactions   Terbinafine And Related Diarrhea    Diarrheal illness while on terbinafine ?related to med   Imitrex [Sumatriptan] Other (See Comments)    Worsens migraine      PERTINENT MEDICATIONS:  Outpatient Encounter Medications as of 04/20/2020  Medication Sig   albuterol (PROVENTIL) (2.5 MG/3ML) 0.083% nebulizer solution Take 2.5 mg by nebulization every 6 (six) hours as needed for wheezing or shortness of breath.   Ascorbic Acid (VITAMIN C WITH ROSE HIPS) 500 MG tablet Take 500 mg by mouth daily.   aspirin EC 81 MG tablet Take 81 mg by mouth daily.   benzonatate (TESSALON) 100 MG capsule Take 1 capsule (100 mg total) by mouth 3 (three) times daily as needed for cough.   Budeson-Glycopyrrol-Formoterol (BREZTRI AEROSPHERE) 160-9-4.8 MCG/ACT AERO Inhale 2 puffs into the lungs in the morning and at bedtime.   budesonide-formoterol (SYMBICORT) 160-4.5 MCG/ACT inhaler Inhale 2 puffs into the lungs 2 (two) times daily.   buPROPion (WELLBUTRIN XL) 150 MG 24 hr tablet Take 1 tablet (150 mg total) by mouth daily.   cholecalciferol (VITAMIN D3) 25 MCG (1000 UNIT) tablet Take 1,000 Units by mouth daily.   doxycycline (VIBRA-TABS) 100 MG tablet Take 1 tablet (100 mg total) by mouth 2 (two) times daily.   fluticasone (FLONASE) 50 MCG/ACT nasal spray Place 2 sprays into both nostrils daily. (Patient taking differently: Place 2 sprays into both nostrils daily as needed for allergies or rhinitis. )   folic acid (FOLVITE) 1 MG tablet Take 1 tablet (1 mg total) by mouth daily.   gabapentin (NEURONTIN) 100 MG capsule Take 1 capsule (100 mg total) by mouth 2 (two) times daily.   hydrOXYzine (ATARAX/VISTARIL) 25 MG tablet TAKE 1 TABLET (25 MG TOTAL) BY MOUTH 2 (TWO) TIMES DAILY AS NEEDED FOR ANXIETY.   LINZESS 290 MCG CAPS capsule TAKE 1 CAPSULE EVERY DAY AS NEEDED FOR CONSTIPATION AS DIRECTED   meclizine (ANTIVERT) 25 MG tablet Take 1 tablet  (25 mg total) by mouth 3 (three) times daily as needed for dizziness.   methocarbamol (ROBAXIN) 500 MG tablet TAKE 1 TABLET BY MOUTH 3 TIMES DAILY AS NEEDED FOR MUSCLE SPASMS (SEDATION PRECAUTIONS).   mirtazapine (REMERON) 15 MG tablet TAKE 1 TABLET BY MOUTH EVERYDAY AT BEDTIME (Patient taking differently: Take 15 mg by mouth at bedtime. )   Multiple Vitamin (MULTIVITAMIN WITH MINERALS) TABS tablet Take 1 tablet by mouth daily.   naproxen (NAPROSYN) 375 MG tablet TAKE 1 TABLET BY MOUTH TWICE A DAY AS NEEDED FOR PAIN WITH FOOD   omeprazole (PRILOSEC) 40 MG capsule TAKE 1 CAPSULE BY MOUTH DAILY. FOR THREE WEEKS THEN AS NEEDED. TAKE 30 MIN PRIOR TO LARGE MEAL (Patient taking differently: Take 40 mg by mouth daily. )   OXYGEN Inhale 4 L/min into the lungs continuous.    polyethylene glycol powder (GLYCOLAX/MIRALAX) 17 GM/SCOOP powder Take 17 g by mouth daily as needed for moderate constipation.   predniSONE (DELTASONE) 20 MG  tablet Take 2 tablets (40 mg total) by mouth daily with breakfast.   sertraline (ZOLOFT) 100 MG tablet Take 1.5 tablets (150 mg total) by mouth daily.   SPIRIVA HANDIHALER 18 MCG inhalation capsule INHALE THE CONTENTS OF 1 CAPSULE  VIA  HANDIHALER DAILY   thiamine 100 MG tablet Take 1 tablet (100 mg total) by mouth daily.   [DISCONTINUED] budesonide (PULMICORT) 0.5 MG/2ML nebulizer solution Take 2 mLs (0.5 mg total) by nebulization 2 (two) times daily.   No facility-administered encounter medications on file as of 04/20/2020.     Allahna Husband Jenetta Downer, NP

## 2020-04-21 ENCOUNTER — Telehealth: Payer: Self-pay | Admitting: Family Medicine

## 2020-04-21 DIAGNOSIS — F331 Major depressive disorder, recurrent, moderate: Secondary | ICD-10-CM

## 2020-04-21 DIAGNOSIS — J449 Chronic obstructive pulmonary disease, unspecified: Secondary | ICD-10-CM | POA: Diagnosis not present

## 2020-04-21 NOTE — Telephone Encounter (Signed)
Received message from palliative care NP - increasing depression, pt stopped remeron. Has been encouraged to restart. Pt interested in return to counseling - referral back to [facility] placed.

## 2020-04-24 ENCOUNTER — Telehealth: Payer: Self-pay | Admitting: Family Medicine

## 2020-04-24 NOTE — Telephone Encounter (Signed)
Returned call to [facility].  The rep stated I needed to speak with someone in another dept ([facility]) and provided phn # [phone].

## 2020-04-24 NOTE — Telephone Encounter (Signed)
Pt left v/m requesting cb; Dr Darnell Level had sent order for scooter and pt wants to know where the order was sent for scooter so pt can contact them.

## 2020-04-24 NOTE — Telephone Encounter (Signed)
Shana @ [facility] called to get supplier name for pt scooter She is going to call and check on this for pt   Please call pt with this information

## 2020-04-25 NOTE — Telephone Encounter (Signed)
Called phn # provided, kept getting transferred until call disconnected.  Will try again tomorrow.

## 2020-04-26 NOTE — Telephone Encounter (Signed)
Called phn # provided, kept getting transferred until call disconnected.  Will try again tomorrow.

## 2020-04-27 ENCOUNTER — Ambulatory Visit: Payer: Medicare HMO

## 2020-04-27 ENCOUNTER — Ambulatory Visit: Payer: Medicare Other

## 2020-04-27 NOTE — Telephone Encounter (Signed)
Patient here for nurse visit and wanted to follow-up about the scooter. She wanted to know where it was faxed to so that she could follow-up. This RN informed patient that CMA had attempted several times to call th number provided. Patient wanted to call company and follow up as well. Patient provided with phone number.

## 2020-04-27 NOTE — Progress Notes (Signed)
A user error has taken place: encounter opened in error, closed for administrative reasons.

## 2020-04-27 NOTE — Telephone Encounter (Signed)
Pt called back stating the rx needs to be faxed to [facility] in [place]. Phone # [phone].

## 2020-04-27 NOTE — Telephone Encounter (Signed)
Spoke with Christine Serrano with [employer] notifying her the rx for power scooter was faxed to [facility] in [place], [place].  She documented info and provided phn # [phone] to call in the future if we need to provide any additional info.

## 2020-04-27 NOTE — Telephone Encounter (Addendum)
Noted.  Called phn # provided for [facility] and was told that [facility] is now [facility].  Given fax # [phone] to send form.  Faxed form.

## 2020-04-28 ENCOUNTER — Other Ambulatory Visit: Payer: Medicare Other

## 2020-04-28 ENCOUNTER — Ambulatory Visit: Admission: RE | Admit: 2020-04-28 | Payer: Medicare Other | Source: Ambulatory Visit

## 2020-05-04 NOTE — Telephone Encounter (Signed)
Pt called back and left a VM at Triage saying that she would like to talk to Valinda Hoar regarding this. Pt states on VM that order should have went to [facility] not [facility] she said [facility] doesn't have scooter's but it looks like Lattie Haw G's last note says [facility] is now [facility]  Lattie Haw please call pt back at [phone]

## 2020-05-05 NOTE — Telephone Encounter (Signed)
Spoke with pt informing her I was told [facility] is now [facility] and that's why I faxed them the form.  Pt verbalizes understanding.  States she will contact her ins co to find out what supplier in the area is covered, then call us back with the info.

## 2020-05-08 DIAGNOSIS — J449 Chronic obstructive pulmonary disease, unspecified: Secondary | ICD-10-CM | POA: Diagnosis not present

## 2020-05-09 ENCOUNTER — Telehealth: Payer: Self-pay

## 2020-05-09 NOTE — Telephone Encounter (Signed)
New message    [facility] does not billed scooter insurance company -- only do power wheelchairs.

## 2020-05-09 NOTE — Telephone Encounter (Signed)
Contacted pt concerning her Prolia and she brought up her scooter. Advised pt to contact the DME she would like to use and see if they will take her insurance and let this office know. Pt verbalized understanding.

## 2020-05-09 NOTE — Telephone Encounter (Signed)
Pt tried to pick up her Prolia t her local [facility] but they said they do not have it and the clinic will need to f/u with them. Advised this nurse will f/u with the pharmacy. Pt appreciative.

## 2020-05-09 NOTE — Telephone Encounter (Signed)
Noted. (see TE, 04/24/20)

## 2020-05-15 ENCOUNTER — Ambulatory Visit: Admission: RE | Admit: 2020-05-15 | Payer: Medicare Other | Source: Ambulatory Visit

## 2020-05-15 ENCOUNTER — Telehealth: Payer: Self-pay | Admitting: Pulmonary Disease

## 2020-05-15 NOTE — Telephone Encounter (Signed)
LVM for Christine Serrano to call me back I have her CT rescheduled for 05/24/20 @ 2:30pm at [place]

## 2020-05-17 NOTE — Telephone Encounter (Signed)
I have finally spoke with Christine Serrano and she is aware of the Ct appt and location

## 2020-05-19 NOTE — Telephone Encounter (Signed)
Pt called back and stated that she contacted [facility] in [facility] and they stated that they have the electric scooters available and pt would like the order faxed to [phone]

## 2020-05-21 DIAGNOSIS — J449 Chronic obstructive pulmonary disease, unspecified: Secondary | ICD-10-CM | POA: Diagnosis not present

## 2020-05-23 ENCOUNTER — Other Ambulatory Visit: Payer: Medicare Other | Admitting: Adult Health Nurse Practitioner

## 2020-05-23 ENCOUNTER — Other Ambulatory Visit: Payer: Self-pay | Admitting: Family Medicine

## 2020-05-23 ENCOUNTER — Other Ambulatory Visit: Payer: Self-pay

## 2020-05-23 DIAGNOSIS — E673 Hypervitaminosis D: Secondary | ICD-10-CM

## 2020-05-23 DIAGNOSIS — Z515 Encounter for palliative care: Secondary | ICD-10-CM | POA: Diagnosis not present

## 2020-05-23 DIAGNOSIS — E785 Hyperlipidemia, unspecified: Secondary | ICD-10-CM

## 2020-05-23 DIAGNOSIS — F339 Major depressive disorder, recurrent, unspecified: Secondary | ICD-10-CM

## 2020-05-23 DIAGNOSIS — J9611 Chronic respiratory failure with hypoxia: Secondary | ICD-10-CM | POA: Diagnosis not present

## 2020-05-23 DIAGNOSIS — E538 Deficiency of other specified B group vitamins: Secondary | ICD-10-CM

## 2020-05-23 DIAGNOSIS — Z9981 Dependence on supplemental oxygen: Secondary | ICD-10-CM | POA: Diagnosis not present

## 2020-05-23 DIAGNOSIS — M810 Age-related osteoporosis without current pathological fracture: Secondary | ICD-10-CM

## 2020-05-23 DIAGNOSIS — D649 Anemia, unspecified: Secondary | ICD-10-CM

## 2020-05-23 NOTE — Telephone Encounter (Signed)
Pt called due to she found a place [facility] in [facility]. And was told to fax the order to them.  [address] [place], [place] [address]. [phone] or [phone]

## 2020-05-23 NOTE — Progress Notes (Signed)
[facility] Consult Note Telephone: [phone]  Fax: [phone]  PATIENT NAME: Christine Serrano DOB: [birth year] MRN: [id number]  PRIMARY CARE PROVIDER:   Ria Bush, MD  REFERRING PROVIDER:  Ria Bush, MD [place],  [place] [address]  RESPONSIBLE PARTY:   Self [id number] [name] SO/HCPOA [phone]   RECOMMENDATIONS and PLAN: 1.Advanced care planning. Patient is DNR  2.  COPD.  Patient having increased cough.  Does take prednisone daily.  Continue current plan of care  3.  Depression.  Patient is having increased depression related to not taking her medication consistently.  Discussed taking her medication daily for good therapeutic effect.  Discussed how she felt better when she was taking her medication daily as prescribed.  She does have paperwork she needs to fax to psychiatrist to start treatment.  Have encouraged to get the paperwork faxed so she can start therapy with psychiatrist  Palliative will continue to monitor for symptom management/decline and make recommendations as needed. Have appointment in4weeks. Encouraged to call with any questions or concerns   I spent 40 minutes providing this consultation. More than 50% of the time in this consultation was spent coordinating communication.   HISTORY OF PRESENT ILLNESS:  Christine Serrano is a 59 y.o. year old female with multiple medical problems including severe COPD, HLD, depression. Palliative Care was asked to help address goals of care. Patient having increased productive cough with no changes in color.  Denies increased SOB, fever, N/V/D, dysuria.  She is sleeping more and appetite has decreased.  She has not been taking her depression medication consistently.    CODE STATUS: DNR  PPS: 40% HOSPICE ELIGIBILITY/DIAGNOSIS: TBD  PHYSICAL EXAM:  BP 118/62  HR 85  O2 100% on 3L General: NAD, frail appearing, thin Eyes:  sclera anicteric and noninjected with no drainage noted ENMT:  Moist mucous membranes Cardiovascular: regular rate and rhythm Pulmonary: lung sounds diminished with poor air movementwith wheezing heard throughout; normal respiratory effort Abdomen: soft, nontender, + bowel sounds Extremities: no edema, no joint deformities Skin: no rasheson exposed skin Neurological: Weakness but otherwise nonfocal; has some forgetfulness  PAST MEDICAL HISTORY:  Past Medical History:  Diagnosis Date  . Cervical cancer ([place])   . Chronic idiopathic constipation   . Community acquired pneumonia of right upper lobe of lung 12/10/2016  . COPD (chronic obstructive pulmonary disease) ([place])   . Depression   . Endometriosis   . HLD (hyperlipidemia)   . Smoker     SOCIAL HX:  Social History   Tobacco Use  . Smoking status: Current Every Day Smoker    Packs/day: 3.00    Years: 51.00    Pack years: 153.00    Types: Cigarettes  . Smokeless tobacco: Never Used  . Tobacco comment: 1PPD 04/18/20   Substance Use Topics  . Alcohol use: Yes    Comment: occasional    ALLERGIES:  Allergies  Allergen Reactions  . Terbinafine And Related Diarrhea    Diarrheal illness while on terbinafine ?related to med  . Imitrex [Sumatriptan] Other (See Comments)    Worsens migraine      PERTINENT MEDICATIONS:  Outpatient Encounter Medications as of 05/23/2020  Medication Sig  . albuterol (PROVENTIL) (2.5 MG/3ML) 0.083% nebulizer solution Take 2.5 mg by nebulization every 6 (six) hours as needed for wheezing or shortness of breath.  . Ascorbic Acid (VITAMIN C WITH ROSE HIPS) 500 MG tablet Take 500 mg by mouth  daily.  . aspirin EC 81 MG tablet Take 81 mg by mouth daily.  . benzonatate (TESSALON) 100 MG capsule Take 1 capsule (100 mg total) by mouth 3 (three) times daily as needed for cough.  . Budeson-Glycopyrrol-Formoterol (BREZTRI AEROSPHERE) 160-9-4.8 MCG/ACT AERO Inhale 2 puffs into the lungs in the morning and at  bedtime.  . budesonide-formoterol (SYMBICORT) 160-4.5 MCG/ACT inhaler Inhale 2 puffs into the lungs 2 (two) times daily.  Marland Kitchen buPROPion (WELLBUTRIN XL) 150 MG 24 hr tablet Take 1 tablet (150 mg total) by mouth daily.  . cholecalciferol (VITAMIN D3) 25 MCG (1000 UNIT) tablet Take 1,000 Units by mouth daily.  Marland Kitchen doxycycline (VIBRA-TABS) 100 MG tablet Take 1 tablet (100 mg total) by mouth 2 (two) times daily.  . fluticasone (FLONASE) 50 MCG/ACT nasal spray Place 2 sprays into both nostrils daily. (Patient taking differently: Place 2 sprays into both nostrils daily as needed for allergies or rhinitis. )  . folic acid (FOLVITE) 1 MG tablet Take 1 tablet (1 mg total) by mouth daily.  Marland Kitchen gabapentin (NEURONTIN) 100 MG capsule Take 1 capsule (100 mg total) by mouth 2 (two) times daily.  . hydrOXYzine (ATARAX/VISTARIL) 25 MG tablet TAKE 1 TABLET (25 MG TOTAL) BY MOUTH 2 (TWO) TIMES DAILY AS NEEDED FOR ANXIETY.  Marland Kitchen LINZESS 290 MCG CAPS capsule TAKE 1 CAPSULE EVERY DAY AS NEEDED FOR CONSTIPATION AS DIRECTED  . meclizine (ANTIVERT) 25 MG tablet Take 1 tablet (25 mg total) by mouth 3 (three) times daily as needed for dizziness.  . methocarbamol (ROBAXIN) 500 MG tablet TAKE 1 TABLET BY MOUTH 3 TIMES DAILY AS NEEDED FOR MUSCLE SPASMS (SEDATION PRECAUTIONS).  . mirtazapine (REMERON) 15 MG tablet TAKE 1 TABLET BY MOUTH EVERYDAY AT BEDTIME (Patient taking differently: Take 15 mg by mouth at bedtime. )  . Multiple Vitamin (MULTIVITAMIN WITH MINERALS) TABS tablet Take 1 tablet by mouth daily.  . naproxen (NAPROSYN) 375 MG tablet TAKE 1 TABLET BY MOUTH TWICE A DAY AS NEEDED FOR PAIN WITH FOOD  . omeprazole (PRILOSEC) 40 MG capsule TAKE 1 CAPSULE BY MOUTH DAILY. FOR THREE WEEKS THEN AS NEEDED. TAKE 30 MIN PRIOR TO LARGE MEAL (Patient taking differently: Take 40 mg by mouth daily. )  . OXYGEN Inhale 4 L/min into the lungs continuous.   . polyethylene glycol powder (GLYCOLAX/MIRALAX) 17 GM/SCOOP powder Take 17 g by mouth daily  as needed for moderate constipation.  . predniSONE (DELTASONE) 20 MG tablet Take 2 tablets (40 mg total) by mouth daily with breakfast.  . sertraline (ZOLOFT) 100 MG tablet Take 1.5 tablets (150 mg total) by mouth daily.  Marland Kitchen SPIRIVA HANDIHALER 18 MCG inhalation capsule INHALE THE CONTENTS OF 1 CAPSULE  VIA  HANDIHALER DAILY  . thiamine 100 MG tablet Take 1 tablet (100 mg total) by mouth daily.  . [DISCONTINUED] budesonide (PULMICORT) 0.5 MG/2ML nebulizer solution Take 2 mLs (0.5 mg total) by nebulization 2 (two) times daily.   No facility-administered encounter medications on file as of 05/23/2020.     Xylon Croom Jenetta Downer, NP

## 2020-05-24 ENCOUNTER — Ambulatory Visit: Payer: Medicare Other | Attending: Pulmonary Disease

## 2020-05-24 ENCOUNTER — Other Ambulatory Visit: Payer: Medicare Other

## 2020-05-24 NOTE — Telephone Encounter (Addendum)
Spoke with [facility] to get a fax #.  Given [phone].  Faxed rx for scooter to fax # provided.   Spoke with pt to inform her the order was faxed.  Now she is asking for an rx for a motorized portable wheelchair instead. Plz advise.

## 2020-05-26 ENCOUNTER — Ambulatory Visit (INDEPENDENT_AMBULATORY_CARE_PROVIDER_SITE_OTHER): Payer: Medicare Other

## 2020-05-26 ENCOUNTER — Other Ambulatory Visit: Payer: Self-pay

## 2020-05-26 DIAGNOSIS — Z0000 Encounter for general adult medical examination without abnormal findings: Secondary | ICD-10-CM

## 2020-05-26 NOTE — Progress Notes (Signed)
Subjective:   Christine Serrano is a 59 y.o. female who presents for Medicare Annual (Subsequent) preventive examination.  Review of Systems: N/A      I connected with the patient today by telephone and verified that I am speaking with the correct person using two identifiers. Location patient: home Location nurse: work Persons participating in the telephone visit: patient, nurse.   I discussed the limitations, risks, security and privacy concerns of performing an evaluation and management service by telephone and the availability of in person appointments. I also discussed with the patient that there may be a patient responsible charge related to this service. The patient expressed understanding and verbally consented to this telephonic visit.        Cardiac Risk Factors include: Other (see comment), Risk factor comments: hyperlipidemia     Objective:    Today's Vitals   There is no height or weight on file to calculate BMI.  Advanced Directives 05/26/2020 01/08/2020 12/11/2019 12/10/2019 10/16/2019 09/11/2019 09/10/2019  Does Patient Have a Medical Advance Directive? Yes No No No No - No  Type of Paramedic of [place];Living will - - - - - -  Does patient want to make changes to medical advance directive? - - - - - - -  Copy of [place] in Chart? Yes - validated most recent copy scanned in chart (See row information) - - - - - -  Would patient like information on creating a medical advance directive? - No - Patient declined No - Patient declined - No - Patient declined No - Patient declined -    Current Medications (verified) Outpatient Encounter Medications as of 05/26/2020  Medication Sig  . albuterol (PROVENTIL) (2.5 MG/3ML) 0.083% nebulizer solution Take 2.5 mg by nebulization every 6 (six) hours as needed for wheezing or shortness of breath.  . Ascorbic Acid (VITAMIN C WITH ROSE HIPS) 500 MG tablet Take 500 mg by mouth daily.  Marland Kitchen  aspirin EC 81 MG tablet Take 81 mg by mouth daily.  . benzonatate (TESSALON) 100 MG capsule Take 1 capsule (100 mg total) by mouth 3 (three) times daily as needed for cough.  . Budeson-Glycopyrrol-Formoterol (BREZTRI AEROSPHERE) 160-9-4.8 MCG/ACT AERO Inhale 2 puffs into the lungs in the morning and at bedtime.  . budesonide-formoterol (SYMBICORT) 160-4.5 MCG/ACT inhaler Inhale 2 puffs into the lungs 2 (two) times daily.  Marland Kitchen buPROPion (WELLBUTRIN XL) 150 MG 24 hr tablet Take 1 tablet (150 mg total) by mouth daily.  . cholecalciferol (VITAMIN D3) 25 MCG (1000 UNIT) tablet Take 1,000 Units by mouth daily.  Marland Kitchen doxycycline (VIBRA-TABS) 100 MG tablet Take 1 tablet (100 mg total) by mouth 2 (two) times daily.  . fluticasone (FLONASE) 50 MCG/ACT nasal spray Place 2 sprays into both nostrils daily. (Patient taking differently: Place 2 sprays into both nostrils daily as needed for allergies or rhinitis.)  . folic acid (FOLVITE) 1 MG tablet Take 1 tablet (1 mg total) by mouth daily.  Marland Kitchen gabapentin (NEURONTIN) 100 MG capsule Take 1 capsule (100 mg total) by mouth 2 (two) times daily.  . hydrOXYzine (ATARAX/VISTARIL) 25 MG tablet TAKE 1 TABLET (25 MG TOTAL) BY MOUTH 2 (TWO) TIMES DAILY AS NEEDED FOR ANXIETY.  Marland Kitchen LINZESS 290 MCG CAPS capsule TAKE 1 CAPSULE EVERY DAY AS NEEDED FOR CONSTIPATION AS DIRECTED  . meclizine (ANTIVERT) 25 MG tablet Take 1 tablet (25 mg total) by mouth 3 (three) times daily as needed for dizziness.  . methocarbamol (ROBAXIN)  500 MG tablet TAKE 1 TABLET BY MOUTH 3 TIMES DAILY AS NEEDED FOR MUSCLE SPASMS (SEDATION PRECAUTIONS).  . mirtazapine (REMERON) 15 MG tablet TAKE 1 TABLET BY MOUTH EVERYDAY AT BEDTIME (Patient taking differently: Take 15 mg by mouth at bedtime.)  . Multiple Vitamin (MULTIVITAMIN WITH MINERALS) TABS tablet Take 1 tablet by mouth daily.  . naproxen (NAPROSYN) 375 MG tablet TAKE 1 TABLET BY MOUTH TWICE A DAY AS NEEDED FOR PAIN WITH FOOD  . omeprazole (PRILOSEC) 40 MG  capsule TAKE 1 CAPSULE BY MOUTH DAILY. FOR THREE WEEKS THEN AS NEEDED. TAKE 30 MIN PRIOR TO LARGE MEAL (Patient taking differently: Take 40 mg by mouth daily.)  . OXYGEN Inhale 4 L/min into the lungs continuous.   . polyethylene glycol powder (GLYCOLAX/MIRALAX) 17 GM/SCOOP powder Take 17 g by mouth daily as needed for moderate constipation.  . predniSONE (DELTASONE) 20 MG tablet Take 2 tablets (40 mg total) by mouth daily with breakfast.  . sertraline (ZOLOFT) 100 MG tablet Take 1.5 tablets (150 mg total) by mouth daily.  Marland Kitchen SPIRIVA HANDIHALER 18 MCG inhalation capsule INHALE THE CONTENTS OF 1 CAPSULE  VIA  HANDIHALER DAILY  . thiamine 100 MG tablet Take 1 tablet (100 mg total) by mouth daily.  . [DISCONTINUED] budesonide (PULMICORT) 0.5 MG/2ML nebulizer solution Take 2 mLs (0.5 mg total) by nebulization 2 (two) times daily.   No facility-administered encounter medications on file as of 05/26/2020.    Allergies (verified) Terbinafine and related and Imitrex [sumatriptan]   History: Past Medical History:  Diagnosis Date  . Cervical cancer ([place])   . Chronic idiopathic constipation   . Community acquired pneumonia of right upper lobe of lung 12/10/2016  . COPD (chronic obstructive pulmonary disease) ([place])   . Depression   . Endometriosis   . HLD (hyperlipidemia)   . Smoker    Past Surgical History:  Procedure Laterality Date  . ABLATION ON ENDOMETRIOSIS    . APPENDECTOMY    . BREAST EXCISIONAL BIOPSY Left age 83   benign biopsy - chronic fatty deposit  . COLONOSCOPY WITH PROPOFOL N/A 08/04/2017   TAx3, angiodysplastic lesion, diverticulosis, rpt 5 yrs ([place])  . EXPLORATORY LAPAROTOMY  1992   endometriosis  . FOOT SURGERY Right    needle imbeded  . LASER ABLATION CONDYLOMA CERVICAL / VULVAR    . NASAL SEPTUM SURGERY Bilateral   . OVARIAN CYST SURGERY Bilateral 1980   remote  . SALPINGOOPHORECTOMY Right 1983   ectopic pregnancy  . TONSILLECTOMY     Family History   Problem Relation Age of Onset  . Heart disease Mother   . Hypertension Mother   . Hypertension Father   . COPD Father   . Hearing loss Maternal Grandmother   . Hypertension Maternal Grandmother   . Hearing loss Maternal Grandfather   . Hypertension Maternal Grandfather   . Colon cancer Paternal Grandmother    Social History   Socioeconomic History  . Marital status: Legally Separated    Spouse name: Not on file  . Number of children: 0  . Years of education: Not on file  . Highest education level: Not on file  Occupational History  . Occupation: disabled  Tobacco Use  . Smoking status: Current Every Day Smoker    Packs/day: 1.00    Years: 51.00    Pack years: 51.00    Types: Cigarettes  . Smokeless tobacco: Never Used  . Tobacco comment: 1PPD 04/18/20   Vaping Use  . Vaping Use: Never used  Substance and Sexual Activity  . Alcohol use: Yes    Comment: occasional  . Drug use: No  . Sexual activity: Never  Other Topics Concern  . 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 Difficulty of Paying Living Expenses: Not hard at all  Food Insecurity: No Food Insecurity  . Worried About Charity fundraiser in the Last Year: Never true  . Ran Out of Food in the Last Year: Never true  Transportation Needs: No Transportation Needs  . Lack of Transportation (Medical): No  . Lack of Transportation (Non-Medical): No  Physical Activity: Inactive  . Days of Exercise per Week: 0 days  . Minutes of Exercise per Session: 0 min  Stress: No Stress Concern Present  . Feeling of Stress : Not at all  Social Connections: Not on file    Tobacco Counseling Ready to quit: Not Answered Counseling given: Not Answered Comment:  1PPD 04/18/20    Clinical Intake:  Pre-visit preparation completed: Yes  Pain : No/denies pain     Diabetes: No  How often do you need to have someone help you when you read instructions, pamphlets, or other written materials from your doctor or pharmacy?: 1 - Never What is the last grade level you completed in school?: GED  Diabetic: No Nutrition Risk Assessment:  Has the patient had any N/V/D within the last 2 months?  No  Does the patient have any non-healing wounds?  No  Has the patient had any unintentional weight loss or weight gain?  No   Diabetes:  Is the patient diabetic?  No  If diabetic, was a CBG obtained today?  N/A Did the patient bring in their glucometer from home?  N/A How often do you monitor your CBG's? N/A.   Financial Strains and Diabetes Management:  Are you having any financial strains with the device, your supplies or your medication? N/A.  Does the patient want to be seen by Chronic Care Management for management of their diabetes?  N/A Would the patient like to be referred to a Nutritionist or for Diabetic Management?  N/A    Interpreter Needed?: No  Information entered by :: CJohnson, LPN   Activities of Daily Living In your present state of health, do you have any difficulty performing the following activities: 05/26/2020 01/09/2020  Hearing? N N  Vision? N N  Difficulty concentrating or making decisions? Y N  Comment short term memory -  Walking or climbing stairs? Y N  Dressing or bathing? Y N  Doing errands, shopping? Tempie Donning  Preparing Food and eating ? N -  Using the Toilet? N -  In the past six months, have you accidently leaked urine? Y -  Comment only in the mornings -  Do you have problems with loss of bowel control? N -  Managing your Medications? N -  Managing your Finances? N -  Housekeeping or managing your Housekeeping? Y -  Some recent data might be hidden    Patient Care Team: Ria Bush, MD as PCP - General  (Family Medicine)  Indicate any recent Medical Services you may have received from other than [facility] providers in the past year (date may be approximate).  Assessment:   This is a routine wellness examination for Christine Serrano.  Hearing/Vision screen  Hearing Screening   125Hz  250Hz  500Hz  1000Hz  2000Hz  3000Hz  4000Hz  6000Hz  8000Hz   Right ear:           Left ear:           Vision Screening Comments: Patient gets annual eye exams   Dietary issues and exercise activities discussed: Current Exercise Habits: The patient does not participate in regular exercise at present, Exercise limited by: None identified  Goals    . Patient Stated     Starting 02/06/2018, I will continue to take medications as prescribed.     . Patient Stated     05/26/2020, I will maintain and continue medications as prescribed.     . Quit Smoking      Depression Screen PHQ 2/9 Scores 05/26/2020 02/25/2019 08/13/2018 07/20/2018 03/09/2018 02/06/2018 08/18/2017  PHQ - 2 Score 6 2 4 6 5 4 1   PHQ- 9 Score 12 10 14 20 16 10 4     Fall Risk Fall Risk  05/26/2020 02/25/2019 03/09/2018 02/06/2018 02/05/2017  Falls in the past year? 0 1 No No No  Number falls in past yr: 0 1 - - -  Injury with Fall? 0 1 - - -  Risk for fall due to : Medication side effect - - - -  Follow up Falls evaluation completed;Falls prevention discussed - - - -    FALL RISK PREVENTION PERTAINING TO THE HOME:  Any stairs in or around the home? Yes  If so, are there any without handrails? No  Home free of loose throw rugs in walkways, pet beds, electrical cords, etc? Yes  Adequate lighting in your home to reduce risk of falls? Yes   ASSISTIVE DEVICES UTILIZED TO PREVENT FALLS:  Life alert? No  Use of a cane, walker or w/c? No  Grab bars in the bathroom? Yes  Shower chair or bench in shower? No  Elevated toilet seat or a handicapped toilet? No   TIMED UP AND GO:  Was the test performed? N/A, telephone visit .   Cognitive Function: MMSE - Mini  Mental State Exam 05/26/2020 02/06/2018 02/05/2017  Orientation to time 5 5 5   Orientation to Place 5 5 5   Registration 3 3 3   Attention/ Calculation 5 0 0  Recall 2 3 3   Language- name 2 objects - 0 0  Language- repeat 1 1 1   Language- follow 3 step command - 3 3  Language- read & follow direction - 0 0  Write a sentence - 0 0  Copy design - 0 0  Total score - 20 20  Mini Cog  Mini-Cog screen was completed. Maximum score is 22. A value of 0 denotes this part of the MMSE was not completed or the patient failed this part of the Mini-Cog screening.       Immunizations Immunization History  Administered Date(s) Administered  . Influenza Split 04/17/2016  . Influenza,inj,Quad PF,6+ Mos 02/21/2017, 03/17/2018, 02/25/2019, 03/13/2020  . Pneumococcal Conjugate-13 05/11/2018  . Pneumococcal Polysaccharide-23 06/17/2008    TDAP status: Due, Education has been provided regarding the importance of this vaccine. Advised may receive this vaccine at local pharmacy or Health Dept. Aware to provide a copy of the vaccination record if obtained from local pharmacy or Health Dept. Verbalized acceptance and understanding.  Flu Vaccine status: Up to date  Pneumococcal vaccine status: Up to date  Covid-19 vaccine status: Declined, Education has been provided regarding  the importance of this vaccine but patient still declined. Advised may receive this vaccine at local pharmacy or Health Dept.or vaccine clinic. Aware to provide a copy of the vaccination record if obtained from local pharmacy or Health Dept. Verbalized acceptance and understanding.  Qualifies for Shingles Vaccine? Yes   Zostavax completed No   Shingrix Completed?: No.    Education has been provided regarding the importance of this vaccine. Patient has been advised to call insurance company to determine out of pocket expense if they have not yet received this vaccine. Advised may also receive vaccine at local pharmacy or Health Dept.  Verbalized acceptance and understanding.  Screening Tests Health Maintenance  Topic Date Due  . PAP SMEAR-Modifier  02/22/2019  . MAMMOGRAM  05/20/2019  . COVID-19 Vaccine (1) 06/11/2021 (Originally 01/04/1973)  . TETANUS/TDAP  02/06/2027 (Originally [birth year]03/1978)  . COLONOSCOPY  08/04/2022  . INFLUENZA VACCINE  Completed  . Hepatitis C Screening  Completed  . HIV Screening  Completed    Health Maintenance  Health Maintenance Due  Topic Date Due  . PAP SMEAR-Modifier  02/22/2019  . MAMMOGRAM  05/20/2019    Colorectal cancer screening: Type of screening: Colonoscopy. Completed 08/04/2017. Repeat every 5 years  Mammogram status: due, will discuss with provider at physical  Bone Density status: due, will discuss with provider at physical   Lung Cancer Screening: (Low Dose CT Chest recommended if Age 59-80 years, 30 pack-year currently smoking OR have quit w/in 15 years.) does qualify.   Lung Cancer Screening Referral: completed 10/13/2019  Additional Screening:  Hepatitis C Screening: does qualify; Completed 02/05/2017  Vision Screening: Recommended annual ophthalmology exams for early detection of glaucoma and other disorders of the eye. Is the patient up to date with their annual eye exam?  Yes  Who is the provider or what is the name of the office in which the patient attends annual eye exams? [facility]  If pt is not established with a provider, would they like to be referred to a provider to establish care? No .   Dental Screening: Recommended annual dental exams for proper oral hygiene  Community Resource Referral / Chronic Care Management: CRR required this visit?  No   CCM required this visit?  No      Plan:     I have personally reviewed and noted the following in the patient's chart:   . Medical and social history . Use of alcohol, tobacco or illicit drugs  . Current medications and supplements . Functional ability and status . Nutritional  status . Physical activity . Advanced directives . List of other physicians . Hospitalizations, surgeries, and ER visits in previous 12 months . Vitals . Screenings to include cognitive, depression, and falls . Referrals and appointments  In addition, I have reviewed and discussed with patient certain preventive protocols, quality metrics, and best practice recommendations. A written personalized care plan for preventive services as well as general preventive health recommendations were provided to patient.   Due to this being a telephonic visit, the after visit summary with patients personalized plan was offered to patient via office or my-chart. Patient preferred to pick up at office at next visit or via mychart.   Christine Grime, LPN   [id number]

## 2020-05-26 NOTE — Patient Instructions (Signed)
Christine Serrano , Thank you for taking time to come for your Medicare Wellness Visit. I appreciate your ongoing commitment to your health goals. Please review the following plan we discussed and let me know if I can assist you in the future.   Screening recommendations/referrals: Colonoscopy: Up to date, completed 08/04/2017, due 07/2022 Mammogram: due, will discuss with provider  Bone Density: due, will discuss with provider Recommended yearly ophthalmology/optometry visit for glaucoma screening and checkup Recommended yearly dental visit for hygiene and checkup  Vaccinations: Influenza vaccine: Up to date, completed 03/13/2020, due 01/2021 Pneumococcal vaccine: Completed series Tdap vaccine: decline Shingles vaccine: due, discussed. Check with your insurance regarding coverage if interested   Covid-19: declined  Advanced directives: copy in chart  Conditions/risks identified: hyperlipidemia  Next appointment: Follow up in one year for your annual wellness visit.    Preventive Care 40-64 Years, Female Preventive care refers to lifestyle choices and visits with your health care provider that can promote health and wellness. What does preventive care include?  A yearly physical exam. This is also called an annual well check.  Dental exams once or twice a year.  Routine eye exams. Ask your health care provider how often you should have your eyes checked.  Personal lifestyle choices, including:  Daily care of your teeth and gums.  Regular physical activity.  Eating a healthy diet.  Avoiding tobacco and drug use.  Limiting alcohol use.  Practicing safe sex.  Taking low-dose aspirin daily starting at age 69.  Taking vitamin and mineral supplements as recommended by your health care provider. What happens during an annual well check? The services and screenings done by your health care provider during your annual well check will depend on your age, overall health, lifestyle  risk factors, and family history of disease. Counseling  Your health care provider may ask you questions about your:  Alcohol use.  Tobacco use.  Drug use.  Emotional well-being.  Home and relationship well-being.  Sexual activity.  Eating habits.  Work and work Statistician.  Method of birth control.  Menstrual cycle.  Pregnancy history. Screening  You may have the following tests or measurements:  Height, weight, and BMI.  Blood pressure.  Lipid and cholesterol levels. These may be checked every 5 years, or more frequently if you are over 54 years old.  Skin check.  Lung cancer screening. You may have this screening every year starting at age 29 if you have a 30-pack-year history of smoking and currently smoke or have quit within the past 15 years.  Fecal occult blood test (FOBT) of the stool. You may have this test every year starting at age 74.  Flexible sigmoidoscopy or colonoscopy. You may have a sigmoidoscopy every 5 years or a colonoscopy every 10 years starting at age 5.  Hepatitis C blood test.  Hepatitis B blood test.  Sexually transmitted disease (STD) testing.  Diabetes screening. This is done by checking your blood sugar (glucose) after you have not eaten for a while (fasting). You may have this done every 1-3 years.  Mammogram. This may be done every 1-2 years. Talk to your health care provider about when you should start having regular mammograms. This may depend on whether you have a family history of breast cancer.  BRCA-related cancer screening. This may be done if you have a family history of breast, ovarian, tubal, or peritoneal cancers.  Pelvic exam and Pap test. This may be done every 3 years starting at age 28. Starting  at age 94, this may be done every 5 years if you have a Pap test in combination with an HPV test.  Bone density scan. This is done to screen for osteoporosis. You may have this scan if you are at high risk for  osteoporosis. Discuss your test results, treatment options, and if necessary, the need for more tests with your health care provider. Vaccines  Your health care provider may recommend certain vaccines, such as:  Influenza vaccine. This is recommended every year.  Tetanus, diphtheria, and acellular pertussis (Tdap, Td) vaccine. You may need a Td booster every 10 years.  Zoster vaccine. You may need this after age 22.  Pneumococcal 13-valent conjugate (PCV13) vaccine. You may need this if you have certain conditions and were not previously vaccinated.  Pneumococcal polysaccharide (PPSV23) vaccine. You may need one or two doses if you smoke cigarettes or if you have certain conditions. Talk to your health care provider about which screenings and vaccines you need and how often you need them. This information is not intended to replace advice given to you by your health care provider. Make sure you discuss any questions you have with your health care provider. Document Released: 06/30/2015 Document Revised: 02/21/2016 Document Reviewed: 04/04/2015 Elsevier Interactive Patient Education  2017 [employer] Prevention in the Home Falls can cause injuries. They can happen to people of all ages. There are many things you can do to make your home safe and to help prevent falls. What can I do on the outside of my home?  Regularly fix the edges of walkways and driveways and fix any cracks.  Remove anything that might make you trip as you walk through a door, such as a raised step or threshold.  Trim any bushes or trees on the path to your home.  Use bright outdoor lighting.  Clear any walking paths of anything that might make someone trip, such as rocks or tools.  Regularly check to see if handrails are loose or broken. Make sure that both sides of any steps have handrails.  Any raised decks and porches should have guardrails on the edges.  Have any leaves, snow, or ice cleared  regularly.  Use sand or salt on walking paths during winter.  Clean up any spills in your garage right away. This includes oil or grease spills. What can I do in the bathroom?  Use night lights.  Install grab bars by the toilet and in the tub and shower. Do not use towel bars as grab bars.  Use non-skid mats or decals in the tub or shower.  If you need to sit down in the shower, use a plastic, non-slip stool.  Keep the floor dry. Clean up any water that spills on the floor as soon as it happens.  Remove soap buildup in the tub or shower regularly.  Attach bath mats securely with double-sided non-slip rug tape.  Do not have throw rugs and other things on the floor that can make you trip. What can I do in the bedroom?  Use night lights.  Make sure that you have a light by your bed that is easy to reach.  Do not use any sheets or blankets that are too big for your bed. They should not hang down onto the floor.  Have a firm chair that has side arms. You can use this for support while you get dressed.  Do not have throw rugs and other things on the floor  that can make you trip. What can I do in the kitchen?  Clean up any spills right away.  Avoid walking on wet floors.  Keep items that you use a lot in easy-to-reach places.  If you need to reach something above you, use a strong step stool that has a grab bar.  Keep electrical cords out of the way.  Do not use floor polish or wax that makes floors slippery. If you must use wax, use non-skid floor wax.  Do not have throw rugs and other things on the floor that can make you trip. What can I do with my stairs?  Do not leave any items on the stairs.  Make sure that there are handrails on both sides of the stairs and use them. Fix handrails that are broken or loose. Make sure that handrails are as long as the stairways.  Check any carpeting to make sure that it is firmly attached to the stairs. Fix any carpet that is loose  or worn.  Avoid having throw rugs at the top or bottom of the stairs. If you do have throw rugs, attach them to the floor with carpet tape.  Make sure that you have a light switch at the top of the stairs and the bottom of the stairs. If you do not have them, ask someone to add them for you. What else can I do to help prevent falls?  Wear shoes that:  Do not have high heels.  Have rubber bottoms.  Are comfortable and fit you well.  Are closed at the toe. Do not wear sandals.  If you use a stepladder:  Make sure that it is fully opened. Do not climb a closed stepladder.  Make sure that both sides of the stepladder are locked into place.  Ask someone to hold it for you, if possible.  Clearly mark and make sure that you can see:  Any grab bars or handrails.  First and last steps.  Where the edge of each step is.  Use tools that help you move around (mobility aids) if they are needed. These include:  Canes.  Walkers.  Scooters.  Crutches.  Turn on the lights when you go into a dark area. Replace any light bulbs as soon as they burn out.  Set up your furniture so you have a clear path. Avoid moving your furniture around.  If any of your floors are uneven, fix them.  If there are any pets around you, be aware of where they are.  Review your medicines with your doctor. Some medicines can make you feel dizzy. This can increase your chance of falling. Ask your doctor what other things that you can do to help prevent falls. This information is not intended to replace advice given to you by your health care provider. Make sure you discuss any questions you have with your health care provider. Document Released: 03/30/2009 Document Revised: 11/09/2015 Document Reviewed: 07/08/2014 Elsevier Interactive Patient Education  2017 [employer].

## 2020-05-26 NOTE — Progress Notes (Signed)
PCP notes:  Health Maintenance: COVID- declined Mammogram- due Dexa- due  Abnormal Screenings: none   Patient concerns: Left shoulder tingling onset several months ago    Nurse concerns: none   Next PCP appt: 06/06/2020 @ 2:30 pm

## 2020-05-31 ENCOUNTER — Other Ambulatory Visit: Payer: Self-pay

## 2020-05-31 ENCOUNTER — Other Ambulatory Visit (INDEPENDENT_AMBULATORY_CARE_PROVIDER_SITE_OTHER): Payer: Medicare Other

## 2020-05-31 DIAGNOSIS — E673 Hypervitaminosis D: Secondary | ICD-10-CM

## 2020-05-31 DIAGNOSIS — D649 Anemia, unspecified: Secondary | ICD-10-CM

## 2020-05-31 DIAGNOSIS — E538 Deficiency of other specified B group vitamins: Secondary | ICD-10-CM | POA: Diagnosis not present

## 2020-05-31 DIAGNOSIS — E785 Hyperlipidemia, unspecified: Secondary | ICD-10-CM | POA: Diagnosis not present

## 2020-05-31 LAB — CBC WITH DIFFERENTIAL/PLATELET
Basophils Absolute: 0 10*3/uL (ref 0.0–0.1)
Basophils Relative: 0.6 % (ref 0.0–3.0)
Eosinophils Absolute: 0.1 10*3/uL (ref 0.0–0.7)
Eosinophils Relative: 2.7 % (ref 0.0–5.0)
HCT: 37.7 % (ref 36.0–46.0)
Hemoglobin: 12.5 g/dL (ref 12.0–15.0)
Lymphocytes Relative: 37.7 % (ref 12.0–46.0)
Lymphs Abs: 2 10*3/uL (ref 0.7–4.0)
MCHC: 33.1 g/dL (ref 30.0–36.0)
MCV: 82.5 fl (ref 78.0–100.0)
Monocytes Absolute: 0.4 10*3/uL (ref 0.1–1.0)
Monocytes Relative: 8.4 % (ref 3.0–12.0)
Neutro Abs: 2.7 10*3/uL (ref 1.4–7.7)
Neutrophils Relative %: 50.6 % (ref 43.0–77.0)
Platelets: 210 10*3/uL (ref 150.0–400.0)
RBC: 4.57 Mil/uL (ref 3.87–5.11)
RDW: 15.4 % (ref 11.5–15.5)
WBC: 5.4 10*3/uL (ref 4.0–10.5)

## 2020-05-31 LAB — COMPREHENSIVE METABOLIC PANEL
ALT: 8 U/L (ref 0–35)
AST: 16 U/L (ref 0–37)
Albumin: 3.8 g/dL (ref 3.5–5.2)
Alkaline Phosphatase: 79 U/L (ref 39–117)
BUN: 5 mg/dL — ABNORMAL LOW (ref 6–23)
CO2: 37 mEq/L — ABNORMAL HIGH (ref 19–32)
Calcium: 9.4 mg/dL (ref 8.4–10.5)
Chloride: 99 mEq/L (ref 96–112)
Creatinine, Ser: 0.6 mg/dL (ref 0.40–1.20)
GFR: 96.89 mL/min (ref >60.00)
Glucose, Bld: 52 mg/dL — ABNORMAL LOW (ref 70–99)
Potassium: 4.2 mEq/L (ref 3.5–5.1)
Sodium: 140 mEq/L (ref 135–145)
Total Bilirubin: 0.4 mg/dL (ref 0.2–1.2)
Total Protein: 6 g/dL (ref 6.0–8.3)

## 2020-05-31 LAB — LIPID PANEL
Cholesterol: 183 mg/dL (ref 0–200)
HDL: 58.4 mg/dL (ref >39.00)
LDL Cholesterol: 108 mg/dL — ABNORMAL HIGH (ref 0–99)
NonHDL: 124.47
Total CHOL/HDL Ratio: 3
Triglycerides: 84 mg/dL (ref 0.0–149.0)
VLDL: 16.8 mg/dL (ref 0.0–40.0)

## 2020-05-31 LAB — VITAMIN D 25 HYDROXY (VIT D DEFICIENCY, FRACTURES): VITD: 65.62 ng/mL (ref 30.00–100.00)

## 2020-05-31 LAB — FOLATE: Folate: 5 ng/mL — ABNORMAL LOW (ref >5.9)

## 2020-06-02 MED ORDER — DENOSUMAB 60 MG/ML ~~LOC~~ SOSY: 60.0000 mg | PREFILLED_SYRINGE | Freq: Once | SUBCUTANEOUS | 0 refills | Status: AC

## 2020-06-02 NOTE — Addendum Note (Signed)
Addended by: Randall An on: 06/02/2020 07:22 PM   Modules accepted: Orders

## 2020-06-02 NOTE — Telephone Encounter (Signed)
Contacted [facility] which said they do not carry the medication and it would need to be sent to mail order specialty pharmacy. Pt has [facility] on her insurance card. Contacted [facility] and spoke with Alta Corning and she reported the script could be sent electronically or faxed to [phone]. The phone # for them is [phone].   Sent in script electronically asking pharmacy to mail to clinic. Not sure if they will mail to clinic or pt address.    Contacted pt and advised the prolia may be shipped to her address or clinic. If shipped to her to place in [place] and call clinic. Advised if it comes here the clinic will contact her. Pt verbalized understanding and was appreciative.

## 2020-06-06 ENCOUNTER — Ambulatory Visit (INDEPENDENT_AMBULATORY_CARE_PROVIDER_SITE_OTHER): Payer: Medicare Other | Admitting: Family Medicine

## 2020-06-06 ENCOUNTER — Other Ambulatory Visit: Payer: Self-pay

## 2020-06-06 ENCOUNTER — Encounter: Payer: Self-pay | Admitting: Family Medicine

## 2020-06-06 VITALS — BP 118/64 | HR 118 | Temp 97.9°F | Ht 62.5 in | Wt 102.3 lb

## 2020-06-06 DIAGNOSIS — R000 Tachycardia, unspecified: Secondary | ICD-10-CM

## 2020-06-06 DIAGNOSIS — J449 Chronic obstructive pulmonary disease, unspecified: Secondary | ICD-10-CM

## 2020-06-06 DIAGNOSIS — Z7189 Other specified counseling: Secondary | ICD-10-CM | POA: Diagnosis not present

## 2020-06-06 DIAGNOSIS — F5104 Psychophysiologic insomnia: Secondary | ICD-10-CM

## 2020-06-06 DIAGNOSIS — Z9981 Dependence on supplemental oxygen: Secondary | ICD-10-CM

## 2020-06-06 DIAGNOSIS — F331 Major depressive disorder, recurrent, moderate: Secondary | ICD-10-CM | POA: Diagnosis not present

## 2020-06-06 DIAGNOSIS — M810 Age-related osteoporosis without current pathological fracture: Secondary | ICD-10-CM

## 2020-06-06 DIAGNOSIS — Z7409 Other reduced mobility: Secondary | ICD-10-CM

## 2020-06-06 DIAGNOSIS — F419 Anxiety disorder, unspecified: Secondary | ICD-10-CM

## 2020-06-06 DIAGNOSIS — E538 Deficiency of other specified B group vitamins: Secondary | ICD-10-CM | POA: Diagnosis not present

## 2020-06-06 DIAGNOSIS — Z0001 Encounter for general adult medical examination with abnormal findings: Secondary | ICD-10-CM

## 2020-06-06 DIAGNOSIS — J9611 Chronic respiratory failure with hypoxia: Secondary | ICD-10-CM

## 2020-06-06 DIAGNOSIS — M5090 Cervical disc disorder, unspecified, unspecified cervical region: Secondary | ICD-10-CM

## 2020-06-06 DIAGNOSIS — Z720 Tobacco use: Secondary | ICD-10-CM

## 2020-06-06 DIAGNOSIS — Z515 Encounter for palliative care: Secondary | ICD-10-CM

## 2020-06-06 DIAGNOSIS — I700 Atherosclerosis of aorta: Secondary | ICD-10-CM

## 2020-06-06 DIAGNOSIS — K5909 Other constipation: Secondary | ICD-10-CM | POA: Diagnosis not present

## 2020-06-06 DIAGNOSIS — I6502 Occlusion and stenosis of left vertebral artery: Secondary | ICD-10-CM

## 2020-06-06 DIAGNOSIS — F1011 Alcohol abuse, in remission: Secondary | ICD-10-CM

## 2020-06-06 DIAGNOSIS — E785 Hyperlipidemia, unspecified: Secondary | ICD-10-CM

## 2020-06-06 MED ORDER — MIRTAZAPINE 15 MG PO TABS: 15.0000 mg | ORAL_TABLET | Freq: Every day | ORAL | 3 refills | Status: DC

## 2020-06-06 MED ORDER — LINZESS 290 MCG PO CAPS: ORAL_CAPSULE | ORAL | 3 refills | Status: DC

## 2020-06-06 MED ORDER — NAPROXEN 375 MG PO TABS: ORAL_TABLET | ORAL | 3 refills | Status: DC

## 2020-06-06 MED ORDER — OMEPRAZOLE 40 MG PO CPDR: 40.0000 mg | DELAYED_RELEASE_CAPSULE | Freq: Every day | ORAL | 3 refills | Status: DC

## 2020-06-06 MED ORDER — FLUTICASONE PROPIONATE 50 MCG/ACT NA SUSP
2.0000 | Freq: Every day | NASAL | 3 refills | Status: DC
Start: 2020-06-06 — End: 2023-03-13

## 2020-06-06 MED ORDER — MECLIZINE HCL 25 MG PO TABS: 25.0000 mg | ORAL_TABLET | Freq: Three times a day (TID) | ORAL | 0 refills | Status: DC | PRN

## 2020-06-06 MED ORDER — GABAPENTIN 100 MG PO CAPS: 100.0000 mg | ORAL_CAPSULE | Freq: Two times a day (BID) | ORAL | 3 refills | Status: DC

## 2020-06-06 MED ORDER — METHOCARBAMOL 500 MG PO TABS: ORAL_TABLET | ORAL | 1 refills | Status: DC

## 2020-06-06 MED ORDER — HYDROXYZINE HCL 25 MG PO TABS: 25.0000 mg | ORAL_TABLET | Freq: Two times a day (BID) | ORAL | 1 refills | Status: DC

## 2020-06-06 MED ORDER — VITAMIN B-12 1000 MCG PO TABS: 1000.0000 ug | ORAL_TABLET | Freq: Every day | ORAL | Status: DC

## 2020-06-06 MED ORDER — BUPROPION HCL ER (XL) 150 MG PO TB24: 150.0000 mg | ORAL_TABLET | Freq: Every day | ORAL | 3 refills | Status: DC

## 2020-06-06 MED ORDER — BENZONATATE 100 MG PO CAPS: 100.0000 mg | ORAL_CAPSULE | Freq: Three times a day (TID) | ORAL | 11 refills | Status: DC | PRN

## 2020-06-06 MED ORDER — FOLIC ACID 1 MG PO TABS: 1.0000 mg | ORAL_TABLET | Freq: Every day | ORAL | 3 refills | Status: DC

## 2020-06-06 NOTE — Telephone Encounter (Signed)
Pt is in office to see PCP. Contacted pharmacy to see if they shipped prolia. Pharmacy did not ship due to waiting on verification from pt. They verified address and will deliver estimation is on 12/28. Pt must confirm it is ok to ship to clinic.  Looked for pt in the clinic but pt had already left.  Contacted pt by phone and advised she will need to contact pharmacy to verify it is ok to ship prolia to the clinic. Pt said she will call them today.  Advised when the prolia arrives this office will contact her to schedule inj. Pt verbalized understanding.

## 2020-06-06 NOTE — Assessment & Plan Note (Addendum)
Preventative protocols reviewed and updated unless pt declined. Discussed healthy diet and lifestyle.  Encouraged to get COVID vaccines.

## 2020-06-06 NOTE — Progress Notes (Signed)
Patient ID: Christine Serrano, female    DOB: 1961-03-02, 59 y.o.   MRN: [id number]  This visit was conducted in person.  BP 118/64 (BP Location: Right Arm, Patient Position: Sitting, Cuff Size: Normal)   Pulse (!) 118   Temp 97.9 F (36.6 C) (Temporal)   Ht 5' 2.5" (1.588 m)   Wt 102 lb 5 oz (46.4 kg)   SpO2 96% Comment: 3 L  BMI 18.41 kg/m    CC: CPE  Subjective:   HPI: Christine Serrano is a 59 y.o. female presenting on 06/06/2020 for Annual Exam (Prt 2. )   Saw health advisor last week for medicare wellness visit. Note reviewed.    No exam data present  Flowsheet Row Clinical Support from 05/26/2020 in [place] at [place]  PHQ-2 Total Score 6      Fall Risk  05/26/2020 02/25/2019 03/09/2018 02/06/2018 02/05/2017  Falls in the past year? 0 1 No No No  Number falls in past yr: 0 1 - - -  Injury with Fall? 0 1 - - -  Risk for fall due to : Medication side effect - - - -  Follow up Falls evaluation completed;Falls prevention discussed - - - -   Ambulatory assistive device Rx - we have received multiple conflicting statements from suppliers. Initially Rx sent to [facility] supply in [place], then we were told to send to [facility] which changed name to [facility]. Latest - [employer] does not proivde scooters only power wheelchairs. Order was last faxed 05/24/2020. Patient states she needs small sized motorized power wheelchair. She doesn't have ramp at home, is working towards building this.   Severe COPD - last saw pulm last month Christine Serrano) - trial Breztri 2 puffs BID. Pending f/u CT chest (scheduled for tomorrow). Doesn't note significant improvement.   Followed by [facility] palliative care services, latest note 12/7 reviewed.   Depression - current regimen is remeron 15mg  nightly, wellbutrin 150mg  XL daily, sertraline 150mg  daily, and hydroxyzine 25mg  BID PRN anxiety. Referred back to counselor 04/2020 - she has upcoming appointment.    Several months of paresthesias to left lower back now with radiation from neck down shoulder to anterior arm. No shoulder pain. Does have L neck pain. No numbness of hand or arm.   Preventative: COLONOSCOPY WITH PROPOFOL 08/04/2017-TAx3, angiodysplastic lesion, diverticulosis, rpt 5 yrs ([place]). Recommend stop at this time due to co-morbidities.  Mammogram12/2019 -Birads1.  Well woman exam - h/o cervical abnormalities s/p conization 1978, then laser surgery 1987.Pap normal 02/2017- will stop at this time due to comorbidities.  DEXA:05/2018 - T -3.5 hip, -2.6 spine. Started prolia 05/2018, received last dose 01/21/2019. I will check with Christine Serrano about next Prolia shot.  Flu shot yearly  Pneumovax 2010. prevnar 04/2018 Tetanus - declines for now Shingrix - discussed  Advanced directive discussion:has at home. HCPOA is partner Christine Serrano. Thinks would be ok with CPR, but wouldn't want prolonged life support if terminal condition. Doesn't think she would want trial of intubation. Asked to bring [place] a copy.  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 Interim medical history since our last visit reviewed. Allergies and medications reviewed and updated. Outpatient Medications Prior to Visit  Medication Sig Dispense Refill  . Ascorbic Acid (VITAMIN C WITH ROSE HIPS) 500 MG tablet Take 500 mg by mouth daily.    Marland Kitchen aspirin EC 81 MG tablet Take 81 mg by mouth daily.    . Budeson-Glycopyrrol-Formoterol  (BREZTRI AEROSPHERE) 160-9-4.8 MCG/ACT AERO Inhale 2 puffs into the lungs in the morning and at bedtime. 10.7 g 0  . cholecalciferol (VITAMIN D3) 25 MCG (1000 UNIT) tablet Take 1,000 Units by mouth daily.    . Multiple Vitamin (MULTIVITAMIN WITH MINERALS) TABS tablet Take 1 tablet by mouth daily.    . OXYGEN Inhale 4 L/min into the lungs continuous.     . polyethylene glycol powder (GLYCOLAX/MIRALAX) 17 GM/SCOOP powder Take 17 g by mouth daily as needed for moderate constipation. 3350 g 1  . sertraline (ZOLOFT) 100 MG tablet Take 1.5 tablets (150 mg total) by mouth daily. 135 tablet 3  . thiamine 100 MG tablet Take 1 tablet (100 mg total) by mouth daily. 30 tablet 0  . benzonatate (TESSALON) 100 MG capsule Take 1 capsule (100 mg total) by mouth 3 (three) times daily as needed for cough. 30 capsule 3  . buPROPion (WELLBUTRIN XL) 150 MG 24 hr tablet Take 1 tablet (150 mg total) by mouth daily. 90 tablet 1  . doxycycline (VIBRA-TABS) 100 MG tablet Take 1 tablet (100 mg total) by mouth 2 (two) times daily. 14 tablet 0  . fluticasone (FLONASE) 50 MCG/ACT nasal spray Place 2 sprays into both nostrils daily. (Patient taking differently: Place 2 sprays into both nostrils daily as needed for allergies or rhinitis.) 48 g 3  . folic acid (FOLVITE) 1 MG tablet Take 1 tablet (1 mg total) by mouth daily. 90 tablet 3  . gabapentin (NEURONTIN) 100 MG capsule Take 1 capsule (100 mg total) by mouth 2 (two) times daily. 180 capsule 1  . hydrOXYzine (ATARAX/VISTARIL) 25 MG tablet TAKE 1 TABLET (25 MG TOTAL) BY MOUTH 2 (TWO) TIMES DAILY AS NEEDED FOR ANXIETY. 180 tablet 0  . LINZESS 290 MCG CAPS capsule TAKE 1 CAPSULE EVERY DAY AS NEEDED FOR CONSTIPATION AS DIRECTED 30 capsule 3  . meclizine (ANTIVERT) 25 MG tablet Take 1 tablet (25 mg total) by mouth 3 (three) times daily as needed for dizziness. 30 tablet 0  . methocarbamol (ROBAXIN) 500 MG tablet TAKE 1 TABLET BY MOUTH 3 TIMES DAILY AS NEEDED FOR MUSCLE SPASMS  (SEDATION PRECAUTIONS). 90 tablet 0  . mirtazapine (REMERON) 15 MG tablet TAKE 1 TABLET BY MOUTH EVERYDAY AT BEDTIME (Patient taking differently: Take 15 mg by mouth at bedtime.) 90 tablet 3  . naproxen (NAPROSYN) 375 MG tablet TAKE 1 TABLET BY MOUTH TWICE A DAY AS NEEDED FOR PAIN WITH FOOD 40 tablet 0  . omeprazole (PRILOSEC) 40 MG capsule TAKE 1 CAPSULE BY MOUTH DAILY. FOR THREE WEEKS THEN AS NEEDED. TAKE 30 MIN PRIOR TO LARGE MEAL (Patient taking differently: Take 40 mg by mouth daily.) 90 capsule 1  . predniSONE (DELTASONE) 20 MG tablet Take 2 tablets (40 mg total) by mouth daily with breakfast. 10 tablet 0  . albuterol (PROVENTIL) (2.5 MG/3ML) 0.083% nebulizer solution Take 2.5 mg by nebulization every 6 (six) hours as needed for wheezing or shortness of breath.    . budesonide-formoterol (SYMBICORT) 160-4.5 MCG/ACT inhaler Inhale 2 puffs into the  lungs 2 (two) times daily. 3 Inhaler 0  . SPIRIVA HANDIHALER 18 MCG inhalation capsule INHALE THE CONTENTS OF 1 CAPSULE  VIA  HANDIHALER DAILY 90 capsule 0   No facility-administered medications prior to visit.     Per HPI unless specifically indicated in ROS section below Review of Systems Objective:  BP 118/64 (BP Location: Right Arm, Patient Position: Sitting, Cuff Size: Normal)   Pulse (!) 118   Temp 97.9 F (36.6 C) (Temporal)   Ht 5' 2.5" (1.588 m)   Wt 102 lb 5 oz (46.4 kg)   SpO2 96% Comment: 3 L  BMI 18.41 kg/m   Wt Readings from Last 3 Encounters:  06/06/20 102 lb 5 oz (46.4 kg)  04/18/20 109 lb 6.4 oz (49.6 kg)  04/11/20 110 lb 8 oz (50.1 kg)      Physical Exam Vitals and nursing note reviewed.  Constitutional:      Appearance: Normal appearance.     Comments: Thin, using supplemental oxygen via [place]  Eyes:     Extraocular Movements: Extraocular movements intact.     Pupils: Pupils are equal, round, and reactive to light.  Cardiovascular:     Rate and Rhythm: Normal rate and regular rhythm.     Pulses: Normal pulses.      Heart sounds: Normal heart sounds. No murmur heard.     Comments: Tachycardic with mild exertion Pulmonary:     Effort: Pulmonary effort is normal. No respiratory distress.     Breath sounds: Wheezing (coarse bilaterally) present. No rhonchi or rales.  Musculoskeletal:     Right lower leg: No edema.     Left lower leg: No edema.  Skin:    General: Skin is warm and dry.     Findings: No rash.  Neurological:     Mental Status: She is alert.  Psychiatric:        Mood and Affect: Mood normal.        Behavior: Behavior normal.       Results for orders placed or performed in visit on 05/31/20  CBC with Differential/Platelet  Result Value Ref Range   WBC 5.4 4.0 - 10.5 K/uL   RBC 4.57 3.87 - 5.11 Mil/uL   Hemoglobin 12.5 12.0 - 15.0 g/dL   HCT 37.7 36.0 - 46.0 %   MCV 82.5 78.0 - 100.0 fl   MCHC 33.1 30.0 - 36.0 g/dL   RDW 15.4 11.5 - 15.5 %   Platelets 210.0 150.0 - 400.0 K/uL   Neutrophils Relative % 50.6 43.0 - 77.0 %   Lymphocytes Relative 37.7 12.0 - 46.0 %   Monocytes Relative 8.4 3.0 - 12.0 %   Eosinophils Relative 2.7 0.0 - 5.0 %   Basophils Relative 0.6 0.0 - 3.0 %   Neutro Abs 2.7 1.4 - 7.7 K/uL   Lymphs Abs 2.0 0.7 - 4.0 K/uL   Monocytes Absolute 0.4 0.1 - 1.0 K/uL   Eosinophils Absolute 0.1 0.0 - 0.7 K/uL   Basophils Absolute 0.0 0.0 - 0.1 K/uL  VITAMIN D 25 Hydroxy (Vit-D Deficiency, Fractures)  Result Value Ref Range   VITD 65.62 30.00 - 100.00 ng/mL  Folate  Result Value Ref Range   Folate 5.0 (L) >5.9 ng/mL  Lipid panel  Result Value Ref Range   Cholesterol 183 0 - 200 mg/dL   Triglycerides 84.0 0.0 - 149.0 mg/dL   HDL 58.40 >39.00 mg/dL   VLDL 16.8 0.0 - 40.0 mg/dL   LDL Cholesterol 108 (H) 0 -  99 mg/dL   Total CHOL/HDL Ratio 3    NonHDL 124.47   Comprehensive metabolic panel  Result Value Ref Range   Sodium 140 135 - 145 mEq/L   Potassium 4.2 3.5 - 5.1 mEq/L   Chloride 99 96 - 112 mEq/L   CO2 37 (H) 19 - 32 mEq/L   Glucose, Bld 52 (L) 70 - 99  mg/dL   BUN 5 (L) 6 - 23 mg/dL   Creatinine, Ser 0.60 0.40 - 1.20 mg/dL   Total Bilirubin 0.4 0.2 - 1.2 mg/dL   Alkaline Phosphatase 79 39 - 117 U/L   AST 16 0 - 37 U/L   ALT 8 0 - 35 U/L   Total Protein 6.0 6.0 - 8.3 g/dL   Albumin 3.8 3.5 - 5.2 g/dL   GFR 96.89 >60.00 mL/min   Calcium 9.4 8.4 - 10.5 mg/dL   Lab Results  Component Value Date   VITAMINB12 379 09/11/2019   Assessment & Plan:  This visit occurred during the SARS-CoV-2 public health emergency.  Safety protocols were in place, including screening questions prior to the visit, additional usage of staff PPE, and extensive cleaning of exam room while observing appropriate contact time as indicated for disinfecting solutions.   Problem List Items Addressed This Visit    Vertebral artery stenosis, left    Consider updating vascular imaging.  Consider statin.       Tobacco abuse    Continued smoker. Has dropped from 3 ppd to 1ppd. Encourage continued cessation.       Tachycardia    Ongoing, tachycardic after she walks into office, then pulse does slow down by end of visit. Continue to monitor.       Palliative care status   Osteoporosis    Last prolia injection was 01/2019. Overdue for this. Christine Serrano will touch base with patient about getting it scheduled. She is at increased risk of fracture due to being late for prolia. Consider bisphosphonate to bridge between prolia shots if this happens again. Consider updated DEXA after next prolia shot.       Moderate recurrent major depression ([place])    Ongoing difficulty despite sertraline 150mg  daily, wellbutrin XL 150mg  daily, and remerol 15mg  daily. Also continues hydroxyzine 25mg  BID prn anxiety. She has upcoming appt with counselor.       Relevant Medications   buPROPion (WELLBUTRIN XL) 150 MG 24 hr tablet   hydrOXYzine (ATARAX/VISTARIL) 25 MG tablet   mirtazapine (REMERON) 15 MG tablet   Low serum vitamin B12    Advised to start 1000 mcg vit B12 daily.        Impaired mobility    Ongoing difficulty, has been unable to acquire power operated vehicle.  Now states she needs a small power wheelchair as she's unable to navigate scooter in the home.   -------------------------------------------------------------- Ambulationisseverely limited by chronic respiratory failure- she is unable to walk 50 feet without stopping to restdue to dyspnea.This limits her MRADLs.She is able to use hands to guide POV.Cane/walker are not enough to prevent falls due to generalized weakness from deconditioning.Unable to use manual wheelchair becauseof easyarm strengthfatigability - arms quickly give out whenever exerting.She has difficulty maintaining postural stability to operate scooter.   No paresthesias of feet.  She lives in a 1 story house with 1 step to get into the house, has hard floor at home.  She does not have ramp but is planning toinstallone.   She is able to safely get on and off POV.  She is able to operate the joystick.  She has mental and physical capacity to operate POV.  She endorses herhome provides adequate access between the rooms, maneuvering space, surfaces for operating small power wheelchair. Unable to fit normal wheelchair in the home.  Using a POV will significantly improve her ability to participate in MRADLs, she will use POV inthe home.      HLD (hyperlipidemia)    Chronic, mild not on statin.  The 10-year ASCVD risk score Mikey Bussing DC Brooke Bonito., et al., 2013) is: 5.8%   Values used to calculate the score:     Age: 82 years     Sex: Female     Is Non-Hispanic African American: No     Diabetic: No     Tobacco smoker: Yes     Systolic Blood Pressure: 329 mmHg     Is BP treated: No     HDL Cholesterol: 58.4 mg/dL     Total Cholesterol: 183 mg/dL       Folate deficiency    Encouraged to start folic acid daily.       Encounter for general adult medical examination with abnormal findings - Primary    Preventative protocols  reviewed and updated unless pt declined. Discussed healthy diet and lifestyle.  Encouraged to get COVID vaccines.      COPD, very severe [facility])    Now sees pulmonology, on daily Breztri (budesonide, glycopyrrolate, formoterol).  Continues supplemental O2 at 3L by [place].       Relevant Medications   benzonatate (TESSALON) 100 MG capsule   fluticasone (FLONASE) 50 MCG/ACT nasal spray   Chronic respiratory failure with hypoxia, on home O2 therapy (HCC)   Chronic insomnia    Now on mirtazapine 15mg  daily.       Chronic constipation    Ongoing despite daily linzess and colace stool softener.       Cervical neck pain with evidence of disc disease    Ongoing L neck pain with L cervical radiculopathy. Reviewed cervical spine xrays from 01/2020 - only mild spondylosis changes.  Continue gabapentin, consider increasing dose if beneficial.      Aortic atherosclerosis (HCC)    Continue aspirin       Anxiety   Relevant Medications   buPROPion (WELLBUTRIN XL) 150 MG 24 hr tablet   hydrOXYzine (ATARAX/VISTARIL) 25 MG tablet   mirtazapine (REMERON) 15 MG tablet   Alcohol abuse, in remission    Continues drinking 1 mangorita/day, limits this.       Advanced directives, counseling/discussion    Partner Anglea is [place]. Asked to bring copy of living will to update chart.           Meds ordered this encounter  Medications  . benzonatate (TESSALON) 100 MG capsule    Sig: Take 1 capsule (100 mg total) by mouth 3 (three) times daily as needed for cough.    Dispense:  30 capsule    Refill:  11  . buPROPion (WELLBUTRIN XL) 150 MG 24 hr tablet    Sig: Take 1 tablet (150 mg total) by mouth daily.    Dispense:  90 tablet    Refill:  3  . fluticasone (FLONASE) 50 MCG/ACT nasal spray    Sig: Place 2 sprays into both nostrils daily.    Dispense:  48 g    Refill:  3  . folic acid (FOLVITE) 1 MG tablet    Sig: Take 1 tablet (1 mg total) by mouth daily.  Dispense:  90 tablet    Refill:  3   . gabapentin (NEURONTIN) 100 MG capsule    Sig: Take 1 capsule (100 mg total) by mouth 2 (two) times daily.    Dispense:  180 capsule    Refill:  3  . hydrOXYzine (ATARAX/VISTARIL) 25 MG tablet    Sig: Take 1 tablet (25 mg total) by mouth in the morning and at bedtime.    Dispense:  180 tablet    Refill:  1  . LINZESS 290 MCG CAPS capsule    Sig: TAKE 1 CAPSULE EVERY DAY AS NEEDED FOR CONSTIPATION AS DIRECTED    Dispense:  90 capsule    Refill:  3  . meclizine (ANTIVERT) 25 MG tablet    Sig: Take 1 tablet (25 mg total) by mouth 3 (three) times daily as needed for dizziness.    Dispense:  30 tablet    Refill:  0  . methocarbamol (ROBAXIN) 500 MG tablet    Sig: TAKE 1 TABLET BY MOUTH 3 TIMES DAILY AS NEEDED FOR MUSCLE SPASMS (SEDATION PRECAUTIONS).    Dispense:  90 tablet    Refill:  1  . mirtazapine (REMERON) 15 MG tablet    Sig: Take 1 tablet (15 mg total) by mouth at bedtime.    Dispense:  90 tablet    Refill:  3  . naproxen (NAPROSYN) 375 MG tablet    Sig: TAKE 1 TABLET BY MOUTH TWICE A DAY AS NEEDED FOR PAIN WITH FOOD    Dispense:  40 tablet    Refill:  3  . omeprazole (PRILOSEC) 40 MG capsule    Sig: Take 1 capsule (40 mg total) by mouth daily.    Dispense:  90 capsule    Refill:  3  . vitamin B-12 (CYANOCOBALAMIN) 1000 MCG tablet    Sig: Take 1 tablet (1,000 mcg total) by mouth daily.   No orders of the defined types were placed in this encounter.   Patient instructions: Consider scheduling mammogram at your convenience: [facility] at [facility] [phone] I will check with [facility] about prolia injection.  If interested, check with pharmacy about new 2 shot shingles series (shingrix).  Continue considering COVID vaccine.  Bring me copy of your living will.  Restart folic acid daily as your levels were low. Start vitamin b12 as well 1063mcg daily, dissolvable tablets if able to find.  Return as needed or in 3 months for follow up visit.   Follow up  plan: Return in about 3 months (around 09/04/2020) for follow up visit.  Ria Bush, MD

## 2020-06-06 NOTE — Patient Instructions (Addendum)
Consider scheduling mammogram at your convenience: [facility] at [facility] [phone] I will check with Larene Beach about prolia injection.  If interested, check with pharmacy about new 2 shot shingles series (shingrix).  Continue considering COVID vaccine.  Bring me copy of your living will.  Restart folic acid daily as your levels were low. Start vitamin b12 as well 104mcg daily, dissolvable tablets if able to find.  Return as needed or in 3 months for follow up visit.   Health Maintenance for Postmenopausal Women Menopause is a normal process in which your ability to get pregnant comes to an end. This process happens slowly over many months or years, usually between the ages of 27 and 65. Menopause is complete when you have missed your menstrual periods for 12 months. It is important to talk with your health care provider about some of the most common conditions that affect women after menopause (postmenopausal women). These include heart disease, cancer, and bone loss (osteoporosis). Adopting a healthy lifestyle and getting preventive care can help to promote your health and wellness. The actions you take can also lower your chances of developing some of these common conditions. What should I know about menopause? During menopause, you may get a number of symptoms, such as:  Hot flashes. These can be moderate or severe.  Night sweats.  Decrease in sex drive.  Mood swings.  Headaches.  Tiredness.  Irritability.  Memory problems.  Insomnia. Choosing to treat or not to treat these symptoms is a decision that you make with your health care provider. Do I need hormone replacement therapy?  Hormone replacement therapy is effective in treating symptoms that are caused by menopause, such as hot flashes and night sweats.  Hormone replacement carries certain risks, especially as you become older. If you are thinking about using estrogen or estrogen with progestin, discuss the  benefits and risks with your health care provider. What is my risk for heart disease and stroke? The risk of heart disease, heart attack, and stroke increases as you age. One of the causes may be a change in the body's hormones during menopause. This can affect how your body uses dietary fats, triglycerides, and cholesterol. Heart attack and stroke are medical emergencies. There are many things that you can do to help prevent heart disease and stroke. Watch your blood pressure  High blood pressure causes heart disease and increases the risk of stroke. This is more likely to develop in people who have high blood pressure readings, are of African descent, or are overweight.  Have your blood pressure checked: ? Every 3-5 years if you are 82-49 years of age. ? Every year if you are 19 years old or older. Eat a healthy diet   Eat a diet that includes plenty of vegetables, fruits, low-fat dairy products, and lean protein.  Do not eat a lot of foods that are high in solid fats, added sugars, or sodium. Get regular exercise Get regular exercise. This is one of the most important things you can do for your health. Most adults should:  Try to exercise for at least 150 minutes each week. The exercise should increase your heart rate and make you sweat (moderate-intensity exercise).  Try to do strengthening exercises at least twice each week. Do these in addition to the moderate-intensity exercise.  Spend less time sitting. Even light physical activity can be beneficial. Other tips  Work with your health care provider to achieve or maintain a healthy weight.  Do not  use any products that contain nicotine or tobacco, such as cigarettes, e-cigarettes, and chewing tobacco. If you need help quitting, ask your health care provider.  Know your numbers. Ask your health care provider to check your cholesterol and your blood sugar (glucose). Continue to have your blood tested as directed by your health care  provider. Do I need screening for cancer? Depending on your health history and family history, you may need to have cancer screening at different stages of your life. This may include screening for:  Breast cancer.  Cervical cancer.  Lung cancer.  Colorectal cancer. What is my risk for osteoporosis? After menopause, you may be at increased risk for osteoporosis. Osteoporosis is a condition in which bone destruction happens more quickly than new bone creation. To help prevent osteoporosis or the bone fractures that can happen because of osteoporosis, you may take the following actions:  If you are 86-69 years old, get at least 1,000 mg of calcium and at least 600 mg of vitamin D per day.  If you are older than age 45 but younger than age 85, get at least 1,200 mg of calcium and at least 600 mg of vitamin D per day.  If you are older than age 35, get at least 1,200 mg of calcium and at least 800 mg of vitamin D per day. Smoking and drinking excessive alcohol increase the risk of osteoporosis. Eat foods that are rich in calcium and vitamin D, and do weight-bearing exercises several times each week as directed by your health care provider. How does menopause affect my mental health? Depression may occur at any age, but it is more common as you become older. Common symptoms of depression include:  Low or sad mood.  Changes in sleep patterns.  Changes in appetite or eating patterns.  Feeling an overall lack of motivation or enjoyment of activities that you previously enjoyed.  Frequent crying spells. Talk with your health care provider if you think that you are experiencing depression. General instructions See your health care provider for regular wellness exams and vaccines. This may include:  Scheduling regular health, dental, and eye exams.  Getting and maintaining your vaccines. These include: ? Influenza vaccine. Get this vaccine each year before the flu season  begins. ? Pneumonia vaccine. ? Shingles vaccine. ? Tetanus, diphtheria, and pertussis (Tdap) booster vaccine. Your health care provider may also recommend other immunizations. Tell your health care provider if you have ever been abused or do not feel safe at home. Summary  Menopause is a normal process in which your ability to get pregnant comes to an end.  This condition causes hot flashes, night sweats, decreased interest in sex, mood swings, headaches, or lack of sleep.  Treatment for this condition may include hormone replacement therapy.  Take actions to keep yourself healthy, including exercising regularly, eating a healthy diet, watching your weight, and checking your blood pressure and blood sugar levels.  Get screened for cancer and depression. Make sure that you are up to date with all your vaccines. This information is not intended to replace advice given to you by your health care provider. Make sure you discuss any questions you have with your health care provider. Document Revised: 05/27/2018 Document Reviewed: 05/27/2018 Elsevier Patient Education  2020 [employer].

## 2020-06-07 ENCOUNTER — Other Ambulatory Visit: Payer: Self-pay

## 2020-06-07 ENCOUNTER — Ambulatory Visit
Admission: RE | Admit: 2020-06-07 | Discharge: 2020-06-07 | Disposition: A | Discharge status: Home / Self Care | Payer: Medicare Other | Source: Ambulatory Visit | Attending: Pulmonary Disease | Admitting: Pulmonary Disease

## 2020-06-07 DIAGNOSIS — I700 Atherosclerosis of aorta: Secondary | ICD-10-CM | POA: Diagnosis not present

## 2020-06-07 DIAGNOSIS — J449 Chronic obstructive pulmonary disease, unspecified: Secondary | ICD-10-CM | POA: Diagnosis not present

## 2020-06-07 DIAGNOSIS — F17210 Nicotine dependence, cigarettes, uncomplicated: Secondary | ICD-10-CM | POA: Insufficient documentation

## 2020-06-07 DIAGNOSIS — Z122 Encounter for screening for malignant neoplasm of respiratory organs: Secondary | ICD-10-CM | POA: Diagnosis not present

## 2020-06-07 DIAGNOSIS — J984 Other disorders of lung: Secondary | ICD-10-CM | POA: Diagnosis not present

## 2020-06-07 DIAGNOSIS — J439 Emphysema, unspecified: Secondary | ICD-10-CM | POA: Diagnosis not present

## 2020-06-07 DIAGNOSIS — J9811 Atelectasis: Secondary | ICD-10-CM | POA: Diagnosis not present

## 2020-06-07 DIAGNOSIS — J432 Centrilobular emphysema: Secondary | ICD-10-CM | POA: Diagnosis not present

## 2020-06-07 DIAGNOSIS — I2510 Atherosclerotic heart disease of native coronary artery without angina pectoris: Secondary | ICD-10-CM | POA: Insufficient documentation

## 2020-06-07 DIAGNOSIS — R918 Other nonspecific abnormal finding of lung field: Secondary | ICD-10-CM

## 2020-06-09 DIAGNOSIS — E538 Deficiency of other specified B group vitamins: Secondary | ICD-10-CM | POA: Insufficient documentation

## 2020-06-09 NOTE — Assessment & Plan Note (Signed)
Ongoing despite daily linzess and colace stool softener.

## 2020-06-09 NOTE — Assessment & Plan Note (Addendum)
Consider updating vascular imaging.  Consider statin.

## 2020-06-09 NOTE — Assessment & Plan Note (Signed)
Ongoing difficulty despite sertraline 150mg  daily, wellbutrin XL 150mg  daily, and remerol 15mg  daily. Also continues hydroxyzine 25mg  BID prn anxiety. She has upcoming appt with counselor.

## 2020-06-09 NOTE — Assessment & Plan Note (Addendum)
Chronic, mild not on statin.  The 10-year ASCVD risk score Mikey Bussing DC Brooke Bonito., et al., 2013) is: 5.8%   Values used to calculate the score:     Age: 59 years     Sex: Female     Is Non-Hispanic African American: No     Diabetic: No     Tobacco smoker: Yes     Systolic Blood Pressure: 159 mmHg     Is BP treated: No     HDL Cholesterol: 58.4 mg/dL     Total Cholesterol: 183 mg/dL

## 2020-06-09 NOTE — Assessment & Plan Note (Signed)
Ongoing difficulty, has been unable to acquire power operated vehicle.  Now states she needs a small power wheelchair as she's unable to navigate scooter in the home.   -------------------------------------------------------------- Ambulationisseverely limited by chronic respiratory failure- she is unable to walk 50 feet without stopping to restdue to dyspnea.This limits her MRADLs.She is able to use hands to guide POV.Cane/walker are not enough to prevent falls due to generalized weakness from deconditioning.Unable to use manual wheelchair becauseof easyarm strengthfatigability - arms quickly give out whenever exerting.She has difficulty maintaining postural stability to operate scooter.   No paresthesias of feet.  She lives in a 1 story house with 1 step to get into the house, has hard floor at home.  She does not have ramp but is planning toinstallone.   She is able to safely get on and off POV.  She is able to operate the joystick.  She has mental and physical capacity to operate POV.  She endorses herhome provides adequate access between the rooms, maneuvering space, surfaces for operating small power wheelchair. Unable to fit normal wheelchair in the home.  Using a POV will significantly improve her ability to participate in MRADLs, she will use POV inthe home.

## 2020-06-09 NOTE — Assessment & Plan Note (Signed)
Now sees pulmonology, on daily Breztri (budesonide, glycopyrrolate, formoterol).  Continues supplemental O2 at 3L by [place].

## 2020-06-09 NOTE — Assessment & Plan Note (Signed)
Encouraged to start folic acid daily.

## 2020-06-09 NOTE — Assessment & Plan Note (Signed)
Now on mirtazapine 15mg  daily.

## 2020-06-09 NOTE — Assessment & Plan Note (Signed)
Continue aspirin 

## 2020-06-09 NOTE — Assessment & Plan Note (Addendum)
Last prolia injection was 01/2019. Overdue for this. Larene Beach will touch base with patient about getting it scheduled. She is at increased risk of fracture due to being late for prolia. Consider bisphosphonate to bridge between prolia shots if this happens again. Consider updated DEXA after next prolia shot.

## 2020-06-09 NOTE — Assessment & Plan Note (Addendum)
Partner Anglea is HCPOA. Asked to bring copy of living will to update chart.

## 2020-06-09 NOTE — Assessment & Plan Note (Signed)
Ongoing L neck pain with L cervical radiculopathy. Reviewed cervical spine xrays from 01/2020 - only mild spondylosis changes.  Continue gabapentin, consider increasing dose if beneficial.

## 2020-06-09 NOTE — Assessment & Plan Note (Signed)
Continued smoker. Has dropped from 3 ppd to 1ppd. Encourage continued cessation.

## 2020-06-09 NOTE — Assessment & Plan Note (Signed)
Continues drinking 1 mangorita/day, limits this.

## 2020-06-09 NOTE — Assessment & Plan Note (Signed)
Advised to start 1000 mcg vit B12 daily.

## 2020-06-09 NOTE — Assessment & Plan Note (Addendum)
Ongoing, tachycardic after she walks into office, then pulse does slow down by end of visit. Continue to monitor.

## 2020-06-13 ENCOUNTER — Other Ambulatory Visit: Payer: Medicare HMO | Admitting: Adult Health Nurse Practitioner

## 2020-06-20 ENCOUNTER — Other Ambulatory Visit: Payer: Self-pay

## 2020-06-20 DIAGNOSIS — R911 Solitary pulmonary nodule: Secondary | ICD-10-CM

## 2020-06-20 DIAGNOSIS — R918 Other nonspecific abnormal finding of lung field: Secondary | ICD-10-CM

## 2020-06-20 NOTE — Progress Notes (Signed)
Per Dr. Jayme Cloud verbally- order PET prior to 07/04/2020 appt.  Patient is aware and voiced her understanding.  Nothing further needed.

## 2020-06-21 ENCOUNTER — Telehealth: Payer: Self-pay | Admitting: *Deleted

## 2020-06-21 ENCOUNTER — Other Ambulatory Visit: Payer: Self-pay

## 2020-06-21 DIAGNOSIS — M810 Age-related osteoporosis without current pathological fracture: Secondary | ICD-10-CM

## 2020-06-21 DIAGNOSIS — J449 Chronic obstructive pulmonary disease, unspecified: Secondary | ICD-10-CM | POA: Diagnosis not present

## 2020-06-21 MED ORDER — CLARITHROMYCIN 500 MG PO TABS: 500.0000 mg | ORAL_TABLET | Freq: Two times a day (BID) | ORAL | 0 refills | Status: AC

## 2020-06-21 NOTE — Telephone Encounter (Signed)
Patient left a voicemail stating that Dr. Sharen Hones was working on trying to get a scooter for her and she has not heard anything about the scooter. Patient also stated that she was suppose to hear something back about Prolia shots and has not heard anything about that either. Patient requested a call back about the scooter and prolia shots.

## 2020-06-22 NOTE — Telephone Encounter (Signed)
Contacted Psychologist, educational and spoke with Marylene Land. She reports the pt did not call until 12/27 at 3:15pm and it was too late in the day to have the shipment there the next day so they they tried to contact the office. They said they had to wait on hold too long so they hung up and put a note that when the office called to have them set up a new date for shipment. Because the 28th was set up they would not just ship the med when they received the consent from the pt. Advised to ship the med now. Marylene Land said now they would need the pt to call them back to confirm her insurance for this year and then this office would have to call back to set up a date to ship again.   Probably need to run benefits again since it is the new year before proceeding.

## 2020-06-22 NOTE — Telephone Encounter (Signed)
Do you know if this was ever received? I did not get it while you were gone.

## 2020-06-27 ENCOUNTER — Ambulatory Visit: Payer: Medicare Other | Admitting: Psychologist

## 2020-06-28 NOTE — Telephone Encounter (Signed)
Patient left another message following up on the scooter and her prolia shot.

## 2020-06-29 NOTE — Telephone Encounter (Signed)
Lvm asking pt to call back.  Need to inform pt we have faxed request to every location requested by pt but all have been denied by ins co for a scooter.  Not sure what else we can do for the pt.  She would have to discuss with ins co to see what type of equipment they will cover.   Fwd message to JoEllen to address Prolia update.

## 2020-06-30 ENCOUNTER — Ambulatory Visit (HOSPITAL_COMMUNITY)
Admission: RE | Admit: 2020-06-30 | Discharge: 2020-06-30 | Disposition: A | Discharge status: Home / Self Care | Payer: Medicare Other | Source: Ambulatory Visit | Attending: Pulmonary Disease | Admitting: Pulmonary Disease

## 2020-06-30 ENCOUNTER — Other Ambulatory Visit: Payer: Self-pay

## 2020-06-30 DIAGNOSIS — I2510 Atherosclerotic heart disease of native coronary artery without angina pectoris: Secondary | ICD-10-CM | POA: Diagnosis not present

## 2020-06-30 DIAGNOSIS — I700 Atherosclerosis of aorta: Secondary | ICD-10-CM | POA: Insufficient documentation

## 2020-06-30 DIAGNOSIS — R911 Solitary pulmonary nodule: Secondary | ICD-10-CM | POA: Diagnosis not present

## 2020-06-30 LAB — GLUCOSE, CAPILLARY: Glucose-Capillary: 90 mg/dL (ref 70–99)

## 2020-06-30 MED ORDER — FLUDEOXYGLUCOSE F - 18 (FDG) INJECTION
5.1100 | Freq: Once | INTRAVENOUS | Status: AC
  Administered 2020-06-30: 5.11 via INTRAVENOUS

## 2020-07-04 ENCOUNTER — Ambulatory Visit: Payer: Medicare Other | Admitting: Pulmonary Disease

## 2020-07-04 NOTE — Telephone Encounter (Addendum)
Printed latest OV and in Lisa's box - plz fax latest note to preferred DME ([facility]) to retry POV application.

## 2020-07-05 ENCOUNTER — Telehealth: Payer: Self-pay | Admitting: Family Medicine

## 2020-07-05 NOTE — Telephone Encounter (Signed)
Patient is scheduled for appt on 07/11/20 for a office visit. Patient received her Prolia injection at her home. Patient is wanting [place] to give it to her on the day of her appt. Will you please her a call to discuss if she can have it on the day of her appt. Patient is going to bring her shot with her. EM

## 2020-07-05 NOTE — Telephone Encounter (Signed)
I did speak with Randall An (Prolia Nurse) who oversees prolia coordination.  She is aware.  Patient was informed to bring shot with her the day of her appt.

## 2020-07-05 NOTE — Telephone Encounter (Signed)
Faxed OV notes to [facility] at [phone].

## 2020-07-06 ENCOUNTER — Ambulatory Visit (INDEPENDENT_AMBULATORY_CARE_PROVIDER_SITE_OTHER): Payer: Medicare Other | Admitting: Pulmonary Disease

## 2020-07-06 ENCOUNTER — Encounter: Payer: Self-pay | Admitting: Pulmonary Disease

## 2020-07-06 ENCOUNTER — Other Ambulatory Visit: Payer: Self-pay

## 2020-07-06 VITALS — BP 120/72 | HR 86 | Temp 97.7°F | Ht 62.5 in | Wt 101.0 lb

## 2020-07-06 DIAGNOSIS — R918 Other nonspecific abnormal finding of lung field: Secondary | ICD-10-CM

## 2020-07-06 DIAGNOSIS — Z9981 Dependence on supplemental oxygen: Secondary | ICD-10-CM

## 2020-07-06 DIAGNOSIS — J9611 Chronic respiratory failure with hypoxia: Secondary | ICD-10-CM | POA: Diagnosis not present

## 2020-07-06 DIAGNOSIS — J449 Chronic obstructive pulmonary disease, unspecified: Secondary | ICD-10-CM | POA: Diagnosis not present

## 2020-07-06 DIAGNOSIS — F17210 Nicotine dependence, cigarettes, uncomplicated: Secondary | ICD-10-CM | POA: Diagnosis not present

## 2020-07-06 MED ORDER — PREDNISONE 5 MG PO TABS: ORAL_TABLET | ORAL | 3 refills | Status: DC

## 2020-07-06 MED ORDER — ALBUTEROL SULFATE (2.5 MG/3ML) 0.083% IN NEBU: 2.5000 mg | INHALATION_SOLUTION | Freq: Four times a day (QID) | RESPIRATORY_TRACT | 6 refills | Status: DC | PRN

## 2020-07-06 NOTE — Patient Instructions (Signed)
We have sent in for some albuterol for your nebulizer to use as needed during the day for shortness of breath and wheezing  Continue Breztri 2 puffs twice a day, we have provided you a spacer to get the medication and deeper into your lungs  We are going to send you a prescription of prednisone 10 mg to take 1 tablet daily  We will see you in follow-up in 4 to 6 weeks time call sooner should any new problems arise

## 2020-07-06 NOTE — Telephone Encounter (Signed)
Just fyi patient  received prolia delivered to home and will bring to next office visit.

## 2020-07-06 NOTE — Progress Notes (Signed)
Subjective:    Patient ID: Christine Serrano, female    DOB: 06-Nov-1960, 60 y.o.   MRN: [id number]  PT PROFILE: 60 year old F recalcitrant smoker with severe, oxygen-dependent COPD hospitalized multiple times for COPD exacerbation initially requiring NIPPV in 2017, former patient of Dr. Alva Garnet.  PROBLEMS: Recalcitrant smoker Chronic hypoxic respiratory failure Very severeCOPD/emphysema Chronic bronchitis  DATA: CT chest 03/13/16:Severe emphysema with extensive disease in the upper lobes and superior segment of the right lower lobe. This could represent acute and/or chronic changes. In addition, there is an irregular spiculated density along the right minor fissure which is indeterminate. PFTs 01/15/18: FVC: 1.42 L (48 %pred), FEV1: 0.76 L (31 %pred), FEV1/FVC: 53% , TLC: 4.49 L (94 %pred), DLCO 30 %pred, DLCO/VA 51%. TLC likely underestimated by gas dilution technique. Severe Gas trapping with RV at 172% predicted. CT chest 10/13/2019: Severe emphysema particularly upper lobes with upper lobe predominant scarring and architectural distortion no significant change from November 25, 2016 CT.  Coronary artery calcifications and aortic calcification was noted. Chest x-ray 10/16/2019: Hyperinflated lungs with diffuse interstitial prominence, no acute infiltrate. CT chest 06/07/2020: Severe emphysema, predominantly upper lobes.  Upper lobe predominant scarring and architectural distortion, new right middle lobe atelectasis. PET/CT 06/30/2020: No evidence of malignancy, previously noted right middle lobe atelectasis resolved, likely inflammatory/infectious.  INTERVAL:  Last seen 04/18/2020 by me.   Persistent shortness of breath, CT scan of the chest ordered due to weight loss, she is however not a candidate for invasive procedures.  HPI Christine Serrano is a 60 year old current smoker (half PPD) who presents for follow-up with regards to COPD with chronic hypoxemic respiratory failure.  Recall that she  was seen on 18 April 2020.  Please refer to that note for details.  At that time she was switched to [facility] 2 puffs twice a day from Symbicort and Spiriva to try to simplify her regimen.  She was also complaining of weight loss which I suspect is due to pulmonary cachexia however, a CT scan of the chest was performed on 22 December to exclude malignancy.  She is aware that she is not a candidate for work-up or treatment due to very severe COPD but she wanted to be aware of what was going on.  A CT scan showed possible atelectasis on the right middle lobe with new nodular densities compared to prior.  She has very severe COPD noted.  She was treated with Biaxin and a PET/CT was ordered. PET/CT has failed to show malignancy, previously noted right middle lobe findings were resolved this changes were likely infectious.  She presents today doing well with Breztri but still having significant wheezing.  Unfortunately, she continues to smoke as noted above.  She has not had any new complaint from her prior visit.  No hemoptysis.  Sputum is mostly clear.  No fevers, chills or sweats.  Weight is stable.  Her BMI is 18.   Review of Systems A 10 point review of systems was performed and it is as noted above otherwise negative.  Patient Active Problem List   Diagnosis Date Noted  . Low serum vitamin B12 06/09/2020  . Cervical neck pain with evidence of disc disease 01/29/2020  . Rhomboid muscle strain, subsequent encounter 11/10/2019  . Palliative care status 10/23/2019  . Impaired mobility 10/20/2019  . Chronic respiratory failure with hypoxia, on home O2 therapy ([place]) 10/20/2019  . Memory difficulties 10/16/2019  . Folate deficiency 09/17/2019  . Dizziness 09/11/2019  . Anemia 09/11/2019  .  Hypocalcemia 09/11/2019  . Systolic murmur 95/18/8416  . Vertebral artery stenosis, left 02/27/2019  . Medicare annual wellness visit, subsequent 02/25/2019  . Advanced directives, counseling/discussion  02/25/2019  . Epistaxis 09/21/2018  . COPD exacerbation ([place]) 09/02/2018  . Osteoporosis 05/23/2018  . Numbness of right hand 10/10/2017  . Nasal sinus congestion 10/10/2017  . Benign neoplasm of ascending colon   . Benign neoplasm of transverse colon   . Benign neoplasm of sigmoid colon   . Positive hepatitis C antibody test 02/23/2017  . Encounter for general adult medical examination with abnormal findings 02/21/2017  . HLD (hyperlipidemia) 02/21/2017  . Anxiety 12/10/2016  . Tobacco abuse 11/28/2016  . Aortic atherosclerosis ([place]) 11/26/2016  . Tachycardia 11/01/2016  . Pulmonary nodule 10/25/2016  . Moderate recurrent major depression ([place]) 07/03/2016  . Chronic constipation 07/03/2016  . Alcohol abuse, in remission 07/03/2016  . COPD, very severe ([place]) 07/03/2016  . Tremor 07/03/2016  . Chronic insomnia 07/03/2016   Social History   Tobacco Use  . Smoking status: Current Every Day Smoker    Packs/day: 2.00    Years: 51.00    Pack years: 102.00    Types: Cigarettes  . Smokeless tobacco: Never Used  . Tobacco comment: 0.5PPD 07/06/2020  Substance Use Topics  . Alcohol use: Yes    Comment: occasional   Allergies  Allergen Reactions  . Terbinafine And Related Diarrhea    Diarrheal illness while on terbinafine ?related to med  . Imitrex [Sumatriptan] Other (See Comments)    Worsens migraine    Current Meds  Medication Sig  . Ascorbic Acid (VITAMIN C WITH ROSE HIPS) 500 MG tablet Take 500 mg by mouth daily.  Marland Kitchen aspirin EC 81 MG tablet Take 81 mg by mouth daily.  . benzonatate (TESSALON) 100 MG capsule Take 1 capsule (100 mg total) by mouth 3 (three) times daily as needed for cough.  . Budeson-Glycopyrrol-Formoterol (BREZTRI AEROSPHERE) 160-9-4.8 MCG/ACT AERO Inhale 2 puffs into the lungs in the morning and at bedtime.  Marland Kitchen buPROPion (WELLBUTRIN XL) 150 MG 24 hr tablet Take 1 tablet (150 mg total) by mouth daily.  . cholecalciferol (VITAMIN D3) 25 MCG (1000 UNIT) tablet  Take 1,000 Units by mouth daily.  . fluticasone (FLONASE) 50 MCG/ACT nasal spray Place 2 sprays into both nostrils daily.  . folic acid (FOLVITE) 1 MG tablet Take 1 tablet (1 mg total) by mouth daily.  Marland Kitchen gabapentin (NEURONTIN) 100 MG capsule Take 1 capsule (100 mg total) by mouth 2 (two) times daily.  . hydrOXYzine (ATARAX/VISTARIL) 25 MG tablet Take 1 tablet (25 mg total) by mouth in the morning and at bedtime.  Marland Kitchen LINZESS 290 MCG CAPS capsule TAKE 1 CAPSULE EVERY DAY AS NEEDED FOR CONSTIPATION AS DIRECTED  . meclizine (ANTIVERT) 25 MG tablet Take 1 tablet (25 mg total) by mouth 3 (three) times daily as needed for dizziness.  . methocarbamol (ROBAXIN) 500 MG tablet TAKE 1 TABLET BY MOUTH 3 TIMES DAILY AS NEEDED FOR MUSCLE SPASMS (SEDATION PRECAUTIONS).  . mirtazapine (REMERON) 15 MG tablet Take 1 tablet (15 mg total) by mouth at bedtime.  . Multiple Vitamin (MULTIVITAMIN WITH MINERALS) TABS tablet Take 1 tablet by mouth daily.  . naproxen (NAPROSYN) 375 MG tablet TAKE 1 TABLET BY MOUTH TWICE A DAY AS NEEDED FOR PAIN WITH FOOD  . omeprazole (PRILOSEC) 40 MG capsule Take 1 capsule (40 mg total) by mouth daily.  . OXYGEN Inhale 4 L/min into the lungs continuous.   . polyethylene glycol  powder (GLYCOLAX/MIRALAX) 17 GM/SCOOP powder Take 17 g by mouth daily as needed for moderate constipation.  . sertraline (ZOLOFT) 100 MG tablet Take 1.5 tablets (150 mg total) by mouth daily.  . vitamin B-12 (CYANOCOBALAMIN) 1000 MCG tablet Take 1 tablet (1,000 mcg total) by mouth daily.   Immunization History  Administered Date(s) Administered  . Influenza Split 04/17/2016  . Influenza,inj,Quad PF,6+ Mos 02/21/2017, 03/17/2018, 02/25/2019, 03/13/2020  . Pneumococcal Conjugate-13 05/11/2018  . Pneumococcal Polysaccharide-23 06/17/2008       Objective:   Physical Exam BP 120/72 (BP Location: Left Arm, Cuff Size: Normal)   Pulse 86   Temp 97.7 F (36.5 C) (Temporal)   Ht 5' 2.5" (1.588 m)   Wt 101 lb (45.8  kg)   SpO2 95%   BMI 18.18 kg/m Nasal cannula O2 at 4 L/min. GENERAL: Thin/cachectic woman, debilitated, looks older than stated age, presents in transport chair. HEAD: Normocephalic, atraumatic.  EYES: Pupils equal, round, reactive to light.  No scleral icterus.  MOUTH: Nose/mouth/throat not examined due to masking requirements for COVID 19. NECK: Supple. No thyromegaly. Trachea midline. No JVD.  No adenopathy. PULMONARY: Distant breath sounds.  Rhonchi, occasional end expiratory wheeze. CARDIOVASCULAR: S1 and S2. Regular rate and rhythm.  No rubs, murmurs or gallops heard. ABDOMEN: Scaphoid, otherwise benign.   MUSCULOSKELETAL: No joint deformity, no clubbing, no edema.  NEUROLOGIC: No overt focal deficit.  Speech is fluent.  Gait not observed.   SKIN: Intact,warm,dry.  On limited exam no rashes. PSYCH: Mood and behavior normal.   Presented to slices from CT scan performed 06/07/2020, severe pulmonary emphysema mostly apical, bilateral apical scarring, right middle lobe atelectasis and nodularity.         Findings subsequently noted to clear by PET/CT performed 30 June 2020 no evidence of malignancy.  All films reviewed with patient.   Assessment & Plan:     ICD-10-CM   1. COPD, very severe ([place])  J44.9    Continue Breztri 2 puffs twice a day Provided spacer for inhaler Start prednisone at 10 mg daily  2. Chronic respiratory failure with hypoxia, on home O2 therapy ([facility])  J96.11    Z99.81    Continue oxygen at 4 L/min  3. Abnormal CT lung screening  R91.8    Stable severe emphysema Stable upper lobe scarring Right middle lobe changes were likely infectious/inflammatory Not a candidate for invasive procedures  4. Tobacco dependence due to cigarettes  F17.210    She was counseled regards to discontinuation of smoking Total counseling time 3 to 5 minutes   Meds ordered this encounter  Medications  . predniSONE (DELTASONE) 5 MG tablet    Sig: Take 2 tablets daily  with breakfast or as directed    Dispense:  60 tablet    Refill:  3  . albuterol (PROVENTIL) (2.5 MG/3ML) 0.083% nebulizer solution    Sig: Take 3 mLs (2.5 mg total) by nebulization every 6 (six) hours as needed for wheezing or shortness of breath.    Dispense:  75 mL    Refill:  6   Smoking cessation instruction/counseling given:  counseled patient on the dangers of tobacco use, advised patient to stop smoking, and reviewed strategies to maximize success.    We will see the patient in follow-up in 4 to 6 weeks time she is to contact us prior to that time should any new difficulties arise.  Renold Don, MD [facility]   *This note was dictated using voice recognition software/Dragon.  Despite best efforts to proofread, errors can occur which can change the meaning.  Any change was purely unintentional.

## 2020-07-07 NOTE — Telephone Encounter (Signed)
Lab ordered.

## 2020-07-07 NOTE — Telephone Encounter (Signed)
Noted.    Dr. Darnell Level, we need new calcium labs for Prolia before 07/11/20 OV.

## 2020-07-07 NOTE — Addendum Note (Signed)
Addended by: Ria Bush on: 07/07/2020 05:11 PM   Modules accepted: Orders

## 2020-07-08 DIAGNOSIS — J449 Chronic obstructive pulmonary disease, unspecified: Secondary | ICD-10-CM | POA: Diagnosis not present

## 2020-07-08 NOTE — Telephone Encounter (Signed)
Last prolia inj was 01/28/20. Pt is not due for next inj until after 07/31/20 and may need labs again. Pt has received the med and can bring this to the apt so we can store it in the monitored refrigeration at the clinic if she prefers.  Will contact pt next week or talk to her at the apt. Made note on office visit note not to give inj.

## 2020-07-10 ENCOUNTER — Other Ambulatory Visit: Payer: Self-pay

## 2020-07-10 ENCOUNTER — Other Ambulatory Visit: Payer: Self-pay | Admitting: Family Medicine

## 2020-07-10 ENCOUNTER — Other Ambulatory Visit: Payer: Medicare Other | Admitting: Adult Health Nurse Practitioner

## 2020-07-10 ENCOUNTER — Telehealth: Payer: Self-pay | Admitting: Adult Health Nurse Practitioner

## 2020-07-10 NOTE — Telephone Encounter (Signed)
Noted  

## 2020-07-10 NOTE — Telephone Encounter (Signed)
Called to do Covid screening prior to visit.  Patient needed to reschedule. Rescheduled visit for 07/19/20 at 1pm Christine Serrano K. Olena Heckle NP

## 2020-07-11 ENCOUNTER — Other Ambulatory Visit: Payer: Self-pay

## 2020-07-11 ENCOUNTER — Encounter: Payer: Self-pay | Admitting: Family Medicine

## 2020-07-11 ENCOUNTER — Ambulatory Visit (INDEPENDENT_AMBULATORY_CARE_PROVIDER_SITE_OTHER): Payer: Medicare Other | Admitting: Family Medicine

## 2020-07-11 VITALS — BP 106/60 | HR 113 | Temp 97.7°F | Ht 62.5 in | Wt 102.4 lb

## 2020-07-11 DIAGNOSIS — Z7409 Other reduced mobility: Secondary | ICD-10-CM

## 2020-07-11 DIAGNOSIS — J9611 Chronic respiratory failure with hypoxia: Secondary | ICD-10-CM | POA: Diagnosis not present

## 2020-07-11 DIAGNOSIS — J449 Chronic obstructive pulmonary disease, unspecified: Secondary | ICD-10-CM

## 2020-07-11 DIAGNOSIS — Z9981 Dependence on supplemental oxygen: Secondary | ICD-10-CM

## 2020-07-11 MED ORDER — ALBUTEROL SULFATE HFA 108 (90 BASE) MCG/ACT IN AERS: 2.0000 | INHALATION_SPRAY | Freq: Four times a day (QID) | RESPIRATORY_TRACT | 32 refills | Status: DC | PRN

## 2020-07-11 NOTE — Progress Notes (Addendum)
Patient ID: Christine Serrano, female    DOB: [birth year], 60 y.o.   MRN: [id number]  This visit was conducted in person.  BP 106/60 (BP Location: Left Arm, Patient Position: Sitting, Cuff Size: Normal)   Pulse (!) 113   Temp 97.7 F (36.5 C) (Temporal)   Ht 5' 2.5" (1.588 m)   Wt 102 lb 7 oz (46.5 kg)   SpO2 96% Comment: 4 L  BMI 18.44 kg/m    CC: mobility assessment Subjective:   HPI: Christine Serrano is a 60 y.o. female presenting on 07/11/2020 for Advice Only and Medical Equipment (Here to discuss power wheelchair.  Form needs to be completed.  Pt accompanied by POA, Angie- temp 97.8.)   Here again to review power wheelchair eligibility.  Needs Rx sent to: [facility]  Received form from DME today.   Very severe COPD prednisone dependent 10mg  daily - doing better since restarting daily controller meds (symbicort and spiriva). Continued smoker 2.5 ppd dropped to 1ppd. Using albuterol 3-4 times a day (inh and neb). Recent antibiotic use. Poor appetite - started drinking ensure. Weight stable.   Followed by [facility] palliative care services.   -------------------------------------------------------------- Ambulationisseverely limited by chronic respiratory failure- she is unable to walk 50 feet without stopping to restdue to dyspnea.This limits her MRADLs.She is able to use hands to guide power wheelchair.Cane/walker are not enough to prevent falls due to generalized weakness from deconditioning.Unable to use manual wheelchair or scooter becauseof easyarm strengthfatigability - arms quickly give out whenever exerting. She has difficulty maintaining postural stability to operate scooter.   No paresthesias of feet.  She lives in a 1 story house with 1 step to get into the house, has hard floor at home.  She does not have ramp but is planning toinstallone.   She is able to safely get on and off and control power wheelchair She is able to operate the joystick.   She has mental and physical capacity to operate power wheelchair.  She endorses herhome provides adequate access between the rooms, maneuvering space, surfaces for operating small power wheelchair. Unable to fit normal wheelchair in the home. Using a power wheelchair will significantly improve her ability to participate in MRADLs, she will use power wheelchair inthe home.     Relevant past medical, surgical, family and social history reviewed and updated as indicated. Interim medical history since our last visit reviewed. Allergies and medications reviewed and updated. Outpatient Medications Prior to Visit  Medication Sig Dispense Refill  . albuterol (PROVENTIL) (2.5 MG/3ML) 0.083% nebulizer solution Take 3 mLs (2.5 mg total) by nebulization every 6 (six) hours as needed for wheezing or shortness of breath. 75 mL 6  . Ascorbic Acid (VITAMIN C WITH ROSE HIPS) 500 MG tablet Take 500 mg by mouth daily.    Marland Kitchen aspirin EC 81 MG tablet Take 81 mg by mouth daily.    . benzonatate (TESSALON) 100 MG capsule Take 1 capsule (100 mg total) by mouth 3 (three) times daily as needed for cough. 30 capsule 11  . Budeson-Glycopyrrol-Formoterol (BREZTRI AEROSPHERE) 160-9-4.8 MCG/ACT AERO Inhale 2 puffs into the lungs in the morning and at bedtime. 10.7 g 0  . buPROPion (WELLBUTRIN XL) 150 MG 24 hr tablet Take 1 tablet (150 mg total) by mouth daily. 90 tablet 3  . cholecalciferol (VITAMIN D3) 25 MCG (1000 UNIT) tablet Take 1,000 Units by mouth daily.    . fluticasone (FLONASE) 50 MCG/ACT nasal spray Place 2 sprays into both nostrils  daily. 48 g 3  . folic acid (FOLVITE) 1 MG tablet Take 1 tablet (1 mg total) by mouth daily. 90 tablet 3  . gabapentin (NEURONTIN) 100 MG capsule Take 1 capsule (100 mg total) by mouth 2 (two) times daily. 180 capsule 3  . hydrOXYzine (ATARAX/VISTARIL) 25 MG tablet Take 1 tablet (25 mg total) by mouth in the morning and at bedtime. 180 tablet 1  . LINZESS 290 MCG CAPS capsule TAKE 1  CAPSULE EVERY DAY AS NEEDED FOR CONSTIPATION AS DIRECTED 90 capsule 3  . meclizine (ANTIVERT) 25 MG tablet Take 1 tablet (25 mg total) by mouth 3 (three) times daily as needed for dizziness. 30 tablet 0  . methocarbamol (ROBAXIN) 500 MG tablet TAKE 1 TABLET BY MOUTH 3 TIMES DAILY AS NEEDED FOR MUSCLE SPASMS (SEDATION PRECAUTIONS). 90 tablet 1  . mirtazapine (REMERON) 15 MG tablet Take 1 tablet (15 mg total) by mouth at bedtime. 90 tablet 3  . Multiple Vitamin (MULTIVITAMIN WITH MINERALS) TABS tablet Take 1 tablet by mouth daily.    . naproxen (NAPROSYN) 375 MG tablet TAKE 1 TABLET BY MOUTH TWICE A DAY AS NEEDED FOR PAIN WITH FOOD 40 tablet 3  . omeprazole (PRILOSEC) 40 MG capsule Take 1 capsule (40 mg total) by mouth daily. 90 capsule 3  . OXYGEN Inhale 4 L/min into the lungs continuous.     . polyethylene glycol powder (GLYCOLAX/MIRALAX) 17 GM/SCOOP powder Take 17 g by mouth daily as needed for moderate constipation. 3350 g 1  . predniSONE (DELTASONE) 5 MG tablet Take 2 tablets daily with breakfast or as directed 60 tablet 3  . sertraline (ZOLOFT) 100 MG tablet Take 1.5 tablets (150 mg total) by mouth daily. 135 tablet 3  . thiamine 100 MG tablet Take 1 tablet (100 mg total) by mouth daily. 30 tablet 0  . vitamin B-12 (CYANOCOBALAMIN) 1000 MCG tablet Take 1 tablet (1,000 mcg total) by mouth daily.     No facility-administered medications prior to visit.     Per HPI unless specifically indicated in ROS section below Review of Systems Objective:  BP 106/60 (BP Location: Left Arm, Patient Position: Sitting, Cuff Size: Normal)   Pulse (!) 113   Temp 97.7 F (36.5 C) (Temporal)   Ht 5' 2.5" (1.588 m)   Wt 102 lb 7 oz (46.5 kg)   SpO2 96% Comment: 4 L  BMI 18.44 kg/m   Wt Readings from Last 3 Encounters:  07/11/20 102 lb 7 oz (46.5 kg)  07/06/20 101 lb (45.8 kg)  06/06/20 102 lb 5 oz (46.4 kg)      Physical Exam Vitals and nursing note reviewed.  Constitutional:      Appearance:  Normal appearance. She is not ill-appearing.     Comments: Unsteady on feet  Cardiovascular:     Rate and Rhythm: Regular rhythm. Tachycardia present.     Pulses: Normal pulses.     Heart sounds: Normal heart sounds. No murmur heard.   Pulmonary:     Effort: Pulmonary effort is normal. Tachypnea present. No respiratory distress.     Breath sounds: Wheezing (coarse) present. No rhonchi or rales.  Skin:    General: Skin is warm and dry.     Findings: No rash.  Neurological:     Mental Status: She is alert.     Cranial Nerves: Cranial nerves are intact.     Sensory: Sensation is intact.     Motor: Motor function is intact.     Comments:  CN grossly intact Sensation intact 4/5 strength BUE, BLE Grip strength intact  Psychiatric:        Mood and Affect: Mood is anxious.        Behavior: Behavior normal.       Results for orders placed or performed during the hospital encounter of 06/30/20  Glucose, capillary  Result Value Ref Range   Glucose-Capillary 90 70 - 99 mg/dL   Assessment & Plan:  This visit occurred during the SARS-CoV-2 public health emergency.  Safety protocols were in place, including screening questions prior to the visit, additional usage of staff PPE, and extensive cleaning of exam room while observing appropriate contact time as indicated for disinfecting solutions.   Problem List Items Addressed This Visit    Impaired mobility - Primary    Ongoing difficulty with impaired mobility.  Has been unable to acquire power operated vehicle despite attempts over the past 1 year.  New Rx has been sent to [place].  Pt needs small power wheelchair to be able to navigate/use due to space limitations in her home.       COPD, very severe ([place])   Relevant Medications   albuterol (VENTOLIN HFA) 108 (90 Base) MCG/ACT inhaler   Chronic respiratory failure with hypoxia, on home O2 therapy ([place])       Meds ordered this encounter  Medications  . albuterol (VENTOLIN  HFA) 108 (90 Base) MCG/ACT inhaler    Sig: Inhale 2 puffs into the lungs every 6 (six) hours as needed for wheezing or shortness of breath.    Dispense:  16 g    Refill:  32   No orders of the defined types were placed in this encounter.   Patient Instructions  We will fax today's note to [facility].    Follow up plan: Return if symptoms worsen or fail to improve.  Ria Bush, MD

## 2020-07-11 NOTE — Patient Instructions (Signed)
We will fax today's note to [facility].

## 2020-07-11 NOTE — Telephone Encounter (Signed)
Advised pt in clinic today that she is not due for prolia until after 2/14. Pt forgot to bring the prolia but she is storing in the refrigerator at home. Scheduled pt for [facility] on 08/01/20 for prolia inj. She will bring the prolia with her for the visit.

## 2020-07-12 ENCOUNTER — Telehealth: Payer: Self-pay | Admitting: *Deleted

## 2020-07-12 DIAGNOSIS — M9902 Segmental and somatic dysfunction of thoracic region: Secondary | ICD-10-CM | POA: Diagnosis not present

## 2020-07-12 DIAGNOSIS — M6240 Contracture of muscle, unspecified site: Secondary | ICD-10-CM | POA: Diagnosis not present

## 2020-07-12 DIAGNOSIS — M9903 Segmental and somatic dysfunction of lumbar region: Secondary | ICD-10-CM | POA: Diagnosis not present

## 2020-07-12 DIAGNOSIS — M9901 Segmental and somatic dysfunction of cervical region: Secondary | ICD-10-CM | POA: Diagnosis not present

## 2020-07-12 DIAGNOSIS — M5413 Radiculopathy, cervicothoracic region: Secondary | ICD-10-CM | POA: Diagnosis not present

## 2020-07-12 DIAGNOSIS — M9904 Segmental and somatic dysfunction of sacral region: Secondary | ICD-10-CM | POA: Diagnosis not present

## 2020-07-12 DIAGNOSIS — M9905 Segmental and somatic dysfunction of pelvic region: Secondary | ICD-10-CM | POA: Diagnosis not present

## 2020-07-12 NOTE — Telephone Encounter (Signed)
Pt wanted to know if she could get inj on 2/4/ when her partner is coming to the clinic for an apt. Advised she could not have apt until after the 14th. Advised she did need labs before then though. Scheduled apt for 2/4 for labs. Pt verbalized understanding.

## 2020-07-12 NOTE — Telephone Encounter (Signed)
Pt has a question about Prolia - requesting a call back from [facility]  Please advise, thanks.

## 2020-07-12 NOTE — Assessment & Plan Note (Addendum)
Ongoing difficulty with impaired mobility.  Has been unable to acquire power operated vehicle despite attempts over the past 1 year.  New Rx has been sent to [place].  Pt needs small power wheelchair to be able to navigate/use due to space limitations in her home.

## 2020-07-14 ENCOUNTER — Other Ambulatory Visit: Payer: Self-pay | Admitting: Family Medicine

## 2020-07-14 DIAGNOSIS — J441 Chronic obstructive pulmonary disease with (acute) exacerbation: Secondary | ICD-10-CM

## 2020-07-19 ENCOUNTER — Telehealth: Payer: Self-pay | Admitting: Adult Health Nurse Practitioner

## 2020-07-19 ENCOUNTER — Other Ambulatory Visit: Payer: Medicare Other | Admitting: Adult Health Nurse Practitioner

## 2020-07-19 ENCOUNTER — Encounter: Payer: Self-pay | Admitting: Pulmonary Disease

## 2020-07-19 ENCOUNTER — Other Ambulatory Visit: Payer: Self-pay

## 2020-07-19 NOTE — Telephone Encounter (Signed)
Spoke with patient prior to visit this afternoon.  Needed to reschedule.  Rescheduled for 07/25/20 @ 1p Simisola Sandles K. Olena Heckle NP

## 2020-07-21 ENCOUNTER — Other Ambulatory Visit: Payer: Self-pay

## 2020-07-21 ENCOUNTER — Other Ambulatory Visit (INDEPENDENT_AMBULATORY_CARE_PROVIDER_SITE_OTHER): Payer: Medicare Other

## 2020-07-21 DIAGNOSIS — M810 Age-related osteoporosis without current pathological fracture: Secondary | ICD-10-CM | POA: Diagnosis not present

## 2020-07-21 LAB — BASIC METABOLIC PANEL
BUN: 6 mg/dL (ref 6–23)
CO2: 36 mEq/L — ABNORMAL HIGH (ref 19–32)
Calcium: 9 mg/dL (ref 8.4–10.5)
Chloride: 101 mEq/L (ref 96–112)
Creatinine, Ser: 0.49 mg/dL (ref 0.40–1.20)
GFR: 101.63 mL/min (ref >60.00)
Glucose, Bld: 72 mg/dL (ref 70–99)
Potassium: 3.8 mEq/L (ref 3.5–5.1)
Sodium: 141 mEq/L (ref 135–145)

## 2020-07-22 DIAGNOSIS — J449 Chronic obstructive pulmonary disease, unspecified: Secondary | ICD-10-CM | POA: Diagnosis not present

## 2020-07-25 ENCOUNTER — Telehealth: Payer: Self-pay

## 2020-07-25 ENCOUNTER — Other Ambulatory Visit: Payer: Self-pay

## 2020-07-25 ENCOUNTER — Telehealth: Payer: Self-pay | Admitting: Adult Health Nurse Practitioner

## 2020-07-25 ENCOUNTER — Other Ambulatory Visit: Payer: Medicare Other | Admitting: Adult Health Nurse Practitioner

## 2020-07-25 NOTE — Telephone Encounter (Signed)
Left multiple VMs to confirm today's appointment. No reply back.  Last VM asked for call back to either change visit to telehealth or to reschedule. Kelbie Moro K. Olena Heckle NP

## 2020-07-25 NOTE — Telephone Encounter (Signed)
Lattie Haw when you can please call in regards to this. Message was sent to [place] directly, thank you for your help in advance.  "I am following up on the power wheelchair from the Face to Face appointment for [name] on 07/11/20 with Dr. Danise Mina.   Can you please give me a call  speak with today as I have some questions to ask about the office note.   My phone is [phone].  Thank you, [place] [phone] Cell

## 2020-07-26 ENCOUNTER — Telehealth: Payer: Self-pay | Admitting: Pulmonary Disease

## 2020-07-26 MED ORDER — BREZTRI AEROSPHERE 160-9-4.8 MCG/ACT IN AERO: 2.0000 | INHALATION_SPRAY | Freq: Two times a day (BID) | RESPIRATORY_TRACT | 11 refills | Status: DC

## 2020-07-26 NOTE — Telephone Encounter (Signed)
Labs normal. Ca normal and CrCl is ok for inj at 88.51. Pt is scheduled for inj on 2/15 and will bring inj with her.

## 2020-07-26 NOTE — Telephone Encounter (Signed)
Lvm returning Christine Serrano's call.

## 2020-07-26 NOTE — Telephone Encounter (Signed)
Rx for Breztri has been sent to preferred pharmacy.  Patient is aware and voiced her understanding. Nothing further needed.   

## 2020-07-27 NOTE — Telephone Encounter (Signed)
Lvm returning Christine Serrano's call.

## 2020-07-27 NOTE — Telephone Encounter (Signed)
Christine Serrano returning call.  States some of the wording in the office note (07/11/20) floats between power wheelchair and scooter.   Christine Serrano asks that Christine Serrano enter an addendum in the HPI as follows:  - Here again to review power wheelchair eligibility.  - She is able to use hands to guide power wheelchair. - Unable to use manual wheelchair or scooter because of easy arm strength   fatigablilty - arms quickly give out whenever exerting. - She is able to safely control power wheelchair. - She has mental and physical capacity to operate power wheelchair. - Using a power wheelchair will significantly improve her ability to participate in                 MRADLs, she will use power wheelchair in the home.   Per Christine Serrano, addendum needs to be signed and dated the day they are done (which I thought was automatic).  She requests a [employer] when this is done.

## 2020-07-28 NOTE — Telephone Encounter (Signed)
Updated.  New OV printed and in Lisa's box

## 2020-07-28 NOTE — Telephone Encounter (Signed)
I sent Christine Serrano a Community Msg notifying her the note has been updated.  She expresses her thanks and will let us know if she needs anything else.

## 2020-08-01 ENCOUNTER — Other Ambulatory Visit: Payer: Self-pay

## 2020-08-01 ENCOUNTER — Ambulatory Visit (INDEPENDENT_AMBULATORY_CARE_PROVIDER_SITE_OTHER): Payer: Medicare Other

## 2020-08-01 DIAGNOSIS — M810 Age-related osteoporosis without current pathological fracture: Secondary | ICD-10-CM | POA: Diagnosis not present

## 2020-08-01 MED ORDER — DENOSUMAB 60 MG/ML ~~LOC~~ SOSY
60.0000 mg | PREFILLED_SYRINGE | Freq: Once | SUBCUTANEOUS | Status: AC
  Administered 2020-08-01: 60 mg via SUBCUTANEOUS

## 2020-08-01 NOTE — Progress Notes (Signed)
Per orders of Dr. Danise Mina, injection of Prolia, given by Aneta Mins, RN. Patient tolerated injection well in R arm.

## 2020-08-04 ENCOUNTER — Other Ambulatory Visit: Payer: Self-pay | Admitting: Family Medicine

## 2020-08-08 DIAGNOSIS — J449 Chronic obstructive pulmonary disease, unspecified: Secondary | ICD-10-CM | POA: Diagnosis not present

## 2020-08-14 ENCOUNTER — Other Ambulatory Visit: Payer: Self-pay

## 2020-08-14 ENCOUNTER — Encounter: Payer: Self-pay | Admitting: Family Medicine

## 2020-08-14 ENCOUNTER — Ambulatory Visit (INDEPENDENT_AMBULATORY_CARE_PROVIDER_SITE_OTHER): Payer: Medicare Other | Admitting: Family Medicine

## 2020-08-14 DIAGNOSIS — L0291 Cutaneous abscess, unspecified: Secondary | ICD-10-CM

## 2020-08-14 DIAGNOSIS — L02412 Cutaneous abscess of left axilla: Secondary | ICD-10-CM | POA: Insufficient documentation

## 2020-08-14 MED ORDER — CEPHALEXIN 500 MG PO CAPS: 500.0000 mg | ORAL_CAPSULE | Freq: Two times a day (BID) | ORAL | 0 refills | Status: DC

## 2020-08-14 NOTE — Patient Instructions (Signed)
Keep area clean with dial soap and water  Use warm compress when you can  Take the keflex as directed   If worse at any time - let us know  If not improving in 2-3 days also let us know

## 2020-08-14 NOTE — Assessment & Plan Note (Signed)
Boil noted in R axilla (too firm to I and D) and mildly tender Px keflex tid for 7d 500 mg  No h/o MRSA but will watch closely  Recommend warm compresses and clean with soap/water Disc expectation it may drain Update if not starting to improve in a week or if worsening  (watch for inc erythema or streaking)

## 2020-08-14 NOTE — Progress Notes (Signed)
Subjective:    Patient ID: Christine Serrano, female    DOB: 1960/12/13, 60 y.o.   MRN: [id number]  This visit occurred during the SARS-CoV-2 public health emergency.  Safety protocols were in place, including screening questions prior to the visit, additional usage of staff PPE, and extensive cleaning of exam room while observing appropriate contact time as indicated for disinfecting solutions.    HPI 60 yo pt of Dr Darnell Level presents with boils under L arm  She has a h/o copd in palliative care status (smoker)   Wt Readings from Last 3 Encounters:  08/14/20 99 lb 4 oz (45 kg)  07/11/20 102 lb 7 oz (46.5 kg)  07/06/20 101 lb (45.8 kg)   17.86 kg/m  Started with one 10 d ago Then more of them  They hurt Not draining   Did do warm compresses   No fevers   She takes chronic prednisone for copd   Last mammogram 2019   Patient Active Problem List   Diagnosis Date Noted  . Abscess 08/14/2020  . Low serum vitamin B12 06/09/2020  . Cervical neck pain with evidence of disc disease 01/29/2020  . Rhomboid muscle strain, subsequent encounter 11/10/2019  . Palliative care status 10/23/2019  . Impaired mobility 10/20/2019  . Chronic respiratory failure with hypoxia, on home O2 therapy ([place]) 10/20/2019  . Memory difficulties 10/16/2019  . Folate deficiency 09/17/2019  . Dizziness 09/11/2019  . Anemia 09/11/2019  . Hypocalcemia 09/11/2019  . Systolic murmur 26/83/4196  . Vertebral artery stenosis, left 02/27/2019  . Medicare annual wellness visit, subsequent 02/25/2019  . Advanced directives, counseling/discussion 02/25/2019  . Epistaxis 09/21/2018  . COPD exacerbation ([place]) 09/02/2018  . Osteoporosis 05/23/2018  . Numbness of right hand 10/10/2017  . Nasal sinus congestion 10/10/2017  . Benign neoplasm of ascending colon   . Benign neoplasm of transverse colon   . Benign neoplasm of sigmoid colon   . Positive hepatitis C antibody test 02/23/2017  . Encounter for general adult  medical examination with abnormal findings 02/21/2017  . HLD (hyperlipidemia) 02/21/2017  . Anxiety 12/10/2016  . Tobacco abuse 11/28/2016  . Aortic atherosclerosis ([place]) 11/26/2016  . Tachycardia 11/01/2016  . Pulmonary nodule 10/25/2016  . Moderate recurrent major depression ([place]) 07/03/2016  . Chronic constipation 07/03/2016  . Alcohol abuse, in remission 07/03/2016  . COPD, very severe ([place]) 07/03/2016  . Tremor 07/03/2016  . Chronic insomnia 07/03/2016   Past Medical History:  Diagnosis Date  . Cervical cancer ([place])   . Chronic idiopathic constipation   . Community acquired pneumonia of right upper lobe of lung 12/10/2016  . COPD (chronic obstructive pulmonary disease) ([place])   . Depression   . Endometriosis   . HLD (hyperlipidemia)   . Smoker    Past Surgical History:  Procedure Laterality Date  . ABLATION ON ENDOMETRIOSIS    . APPENDECTOMY    . BREAST EXCISIONAL BIOPSY Left age 73   benign biopsy - chronic fatty deposit  . COLONOSCOPY WITH PROPOFOL N/A 08/04/2017   TAx3, angiodysplastic lesion, diverticulosis, rpt 5 yrs ([place])  . EXPLORATORY LAPAROTOMY  1992   endometriosis  . FOOT SURGERY Right    needle imbeded  . LASER ABLATION CONDYLOMA CERVICAL / VULVAR    . NASAL SEPTUM SURGERY Bilateral   . OVARIAN CYST SURGERY Bilateral 1980   remote  . SALPINGOOPHORECTOMY Right 1983   ectopic pregnancy  . TONSILLECTOMY     Social History   Tobacco Use  . Smoking status:  Current Every Day Smoker    Packs/day: 2.00    Years: 51.00    Pack years: 102.00    Types: Cigarettes  . Smokeless tobacco: Never Used  . Tobacco comment: 0.5PPD 07/06/2020  Vaping Use  . Vaping Use: Never used  Substance Use Topics  . Alcohol use: Yes    Comment: occasional  . Drug use: No   Family History  Problem Relation Age of Onset  . Heart disease Mother   . Hypertension Mother   . Hypertension Father   . COPD Father   . Hearing loss Maternal Grandmother   . Hypertension  Maternal Grandmother   . Hearing loss Maternal Grandfather   . Hypertension Maternal Grandfather   . Colon cancer Paternal Grandmother    Allergies  Allergen Reactions  . Terbinafine And Related Diarrhea    Diarrheal illness while on terbinafine ?related to med  . Clarithromycin     Caused Thrush   . Imitrex [Sumatriptan] Other (See Comments)    Worsens migraine    Current Outpatient Medications on File Prior to Visit  Medication Sig Dispense Refill  . albuterol (PROVENTIL) (2.5 MG/3ML) 0.083% nebulizer solution Take 3 mLs (2.5 mg total) by nebulization every 6 (six) hours as needed for wheezing or shortness of breath. 75 mL 6  . albuterol (VENTOLIN HFA) 108 (90 Base) MCG/ACT inhaler TAKE 2 PUFFS BY MOUTH EVERY 6 HOURS AS NEEDED FOR WHEEZE OR SHORTNESS OF BREATH 16 each 11  . Ascorbic Acid (VITAMIN C WITH ROSE HIPS) 500 MG tablet Take 500 mg by mouth daily.    Marland Kitchen aspirin EC 81 MG tablet Take 81 mg by mouth daily.    . benzonatate (TESSALON) 100 MG capsule Take 1 capsule (100 mg total) by mouth 3 (three) times daily as needed for cough. 30 capsule 11  . Budeson-Glycopyrrol-Formoterol (BREZTRI AEROSPHERE) 160-9-4.8 MCG/ACT AERO Inhale 2 puffs into the lungs in the morning and at bedtime. 10.7 g 11  . buPROPion (WELLBUTRIN XL) 150 MG 24 hr tablet Take 1 tablet (150 mg total) by mouth daily. 90 tablet 3  . cholecalciferol (VITAMIN D3) 25 MCG (1000 UNIT) tablet Take 1,000 Units by mouth daily.    . fluticasone (FLONASE) 50 MCG/ACT nasal spray Place 2 sprays into both nostrils daily. 48 g 3  . folic acid (FOLVITE) 1 MG tablet Take 1 tablet (1 mg total) by mouth daily. 90 tablet 3  . gabapentin (NEURONTIN) 100 MG capsule Take 1 capsule (100 mg total) by mouth 2 (two) times daily. 180 capsule 3  . hydrOXYzine (ATARAX/VISTARIL) 25 MG tablet Take 1 tablet (25 mg total) by mouth in the morning and at bedtime. 180 tablet 1  . LINZESS 290 MCG CAPS capsule TAKE 1 CAPSULE EVERY DAY AS NEEDED FOR  CONSTIPATION AS DIRECTED 90 capsule 3  . meclizine (ANTIVERT) 25 MG tablet Take 1 tablet (25 mg total) by mouth 3 (three) times daily as needed for dizziness. 30 tablet 0  . methocarbamol (ROBAXIN) 500 MG tablet TAKE 1 TABLET BY MOUTH 3 TIMES DAILY AS NEEDED FOR MUSCLE SPASMS (SEDATION PRECAUTIONS). 90 tablet 1  . mirtazapine (REMERON) 15 MG tablet Take 1 tablet (15 mg total) by mouth at bedtime. 90 tablet 3  . Multiple Vitamin (MULTIVITAMIN WITH MINERALS) TABS tablet Take 1 tablet by mouth daily.    . naproxen (NAPROSYN) 375 MG tablet TAKE 1 TABLET BY MOUTH TWICE A DAY AS NEEDED FOR PAIN WITH FOOD 40 tablet 3  . omeprazole (PRILOSEC) 40 MG  capsule Take 1 capsule (40 mg total) by mouth daily. 90 capsule 3  . OXYGEN Inhale 4 L/min into the lungs continuous.     . polyethylene glycol powder (GLYCOLAX/MIRALAX) 17 GM/SCOOP powder Take 17 g by mouth daily as needed for moderate constipation. 3350 g 1  . predniSONE (DELTASONE) 5 MG tablet Take 2 tablets daily with breakfast or as directed 60 tablet 3  . sertraline (ZOLOFT) 100 MG tablet Take 1.5 tablets (150 mg total) by mouth daily. 135 tablet 3  . thiamine 100 MG tablet Take 1 tablet (100 mg total) by mouth daily. 30 tablet 0  . vitamin B-12 (CYANOCOBALAMIN) 1000 MCG tablet Take 1 tablet (1,000 mcg total) by mouth daily.    . [DISCONTINUED] budesonide (PULMICORT) 0.5 MG/2ML nebulizer solution Take 2 mLs (0.5 mg total) by nebulization 2 (two) times daily. 120 mL 0   No current facility-administered medications on file prior to visit.     Review of Systems  Constitutional: Negative for activity change, appetite change, fatigue, fever and unexpected weight change.  HENT: Negative for congestion, ear pain, rhinorrhea, sinus pressure and sore throat.   Eyes: Negative for pain, redness and visual disturbance.  Respiratory: Positive for cough. Negative for shortness of breath and wheezing.        Baseline cough/sob with copd  Cardiovascular: Negative  for chest pain and palpitations.  Gastrointestinal: Negative for abdominal pain, blood in stool, constipation and diarrhea.  Endocrine: Negative for polydipsia and polyuria.  Genitourinary: Negative for dysuria, frequency and urgency.  Musculoskeletal: Negative for arthralgias, back pain and myalgias.  Skin: Negative for pallor and rash.  Allergic/Immunologic: Negative for environmental allergies.  Neurological: Negative for dizziness, syncope and headaches.  Hematological: Negative for adenopathy. Does not bruise/bleed easily.  Psychiatric/Behavioral: Negative for decreased concentration and dysphoric mood. The patient is not nervous/anxious.        Objective:   Physical Exam Constitutional:      General: She is not in acute distress.    Appearance: Normal appearance. She is not ill-appearing.     Comments: Under weight and frail appearing with 2 via [place]  HENT:     Head: Normocephalic and atraumatic.  Eyes:     General: No scleral icterus.    Conjunctiva/sclera: Conjunctivae normal.     Pupils: Pupils are equal, round, and reactive to light.  Cardiovascular:     Rate and Rhythm: Normal rate and regular rhythm.  Pulmonary:     Comments: Frequent junky sounding cough Musculoskeletal:     Cervical back: Neck supple. No rigidity or tenderness.  Skin:    General: Skin is warm and dry.     Findings: No bruising or rash.     Comments: 2-3 cm area of induration and erythema with small satellite area medially  Firm, tender, not fluctuant No drainage or open areas  Neurological:     Mental Status: She is alert.  Psychiatric:        Mood and Affect: Mood normal.           Assessment & Plan:   Problem List Items Addressed This Visit      Other   Abscess    Boil noted in R axilla (too firm to I and D) and mildly tender Px keflex tid for 7d 500 mg  No h/o MRSA but will watch closely  Recommend warm compresses and clean with soap/water Disc expectation it may drain Update  if not starting to improve in a week or if  worsening  (watch for inc erythema or streaking)

## 2020-08-15 ENCOUNTER — Telehealth: Payer: Self-pay

## 2020-08-15 NOTE — Telephone Encounter (Signed)
Aware, I will watch for correspondence

## 2020-08-15 NOTE — Telephone Encounter (Signed)
Pt saw Dr Glori Bickers on 08/14/20  And pt has taken 4 doses of abx; the area under rt axilla is redder and doubled in size; pt also has swelling and pain in lt nostril to the point cannot see into nose at all on lt side; lt nostril is more than twice as swollen as rt nostril. Pt has constant dull pain in nose and under arm at pain level of 10. Pt has tried Advil with no relief of pain. Pt is not sure if has fever or not. I advised pt with above symptoms even though it may be too soon for abx to get into pts system pt should go to UC for Eval. Pt wants to know if Dr Glori Bickers thinks she needs to go to [facility]. Dr Glori Bickers agrees and said pt should go to UC today. Pt voiced understanding and will get transportation to an UC today. Sending note to Dr Glori Bickers and Dr Darnell Level as PCP.

## 2020-08-16 NOTE — Telephone Encounter (Signed)
Spoke with pt asking for an update.  States she was going to UC but wait was going to be too long.  However, she will go first thing in the morning.  I asked pt to call back tomorrow with an update.  Pt verbalizes understands and expresses her thanks for the call.

## 2020-08-16 NOTE — Telephone Encounter (Signed)
Spoke with pt's SO at [place] today.  Pt did not go to [facility] last night but agreed to go today - please call later today or tomorrow for update. Agree with [facility] eval.

## 2020-08-17 ENCOUNTER — Other Ambulatory Visit: Payer: Medicare Other | Admitting: Adult Health Nurse Practitioner

## 2020-08-17 ENCOUNTER — Other Ambulatory Visit: Payer: Self-pay

## 2020-08-17 DIAGNOSIS — Z515 Encounter for palliative care: Secondary | ICD-10-CM | POA: Diagnosis not present

## 2020-08-17 DIAGNOSIS — L02412 Cutaneous abscess of left axilla: Secondary | ICD-10-CM | POA: Diagnosis not present

## 2020-08-17 DIAGNOSIS — J9611 Chronic respiratory failure with hypoxia: Secondary | ICD-10-CM | POA: Diagnosis not present

## 2020-08-17 DIAGNOSIS — Z9981 Dependence on supplemental oxygen: Secondary | ICD-10-CM | POA: Diagnosis not present

## 2020-08-17 DIAGNOSIS — F339 Major depressive disorder, recurrent, unspecified: Secondary | ICD-10-CM

## 2020-08-17 NOTE — Progress Notes (Addendum)
[place] Consult Note Telephone: [phone]  Fax: [phone]  PATIENT NAME: Christine Serrano DOB: April 22, 1961 MRN: [id number]  PRIMARY CARE PROVIDER:   Ria Bush, MD  REFERRING PROVIDER:  Ria Bush, MD [place],  [place] [address]  RESPONSIBLE PARTY:   Self [phone] [name] SO/HCPOA [phone]   RECOMMENDATIONS and PLAN: 1.Advanced care planning. Patient is DNR  2.  COPD.  Patient has gotten approval for motorized wheelchair with a company that will accept her insurance.  Though it is on back order and will be 3-6 months before receiving it.  She does have SOB with any activity.  Is on 3L O2 continuous.  Continue follow up and recommendations by pulmonology  3.  Depression.  Encouraged to get paperwork faxed to psych office so she can get seen.  Put in SW referral for psychosocial support until she can be seen at psych office.  4.  Abscess.  Encouraged to continue antibiotics and compresses.  Inquired about going to urgent care to have it lanced.  Discussed that as long as she is getting drainage and continues with antibiotic that she can wait till the antibiotic is done.  Discussed that she should go to urgent care if develops fever and if once done with antibiotics the seed is still there without completely draining that she should go to urgent care.  5.  Support.  Patient inquiring about help in the home.  She states she has Medicaid and discussed PCS services in which she is interested in.  Upon looking at her insurance she is not listed as having Medicaid.  Will reach out to her tomorrow to discuss further and possibly applying for Medicaid.  Palliative will continue to monitor for symptom management/decline and make recommendations as needed.  Follow up appointment in 2 weeks.  Encouraged to call with any questions or concerns.  I spent 60 minutes providing this consultation,  including time spent with patient, provider coordination, chart review, documentation. More than 50% of the time in this consultation was spent coordinating communication.   HISTORY OF PRESENT ILLNESS:  Christine Serrano is a 60 y.o. year old female with multiple medical problems including severe COPD, HLD, depression. Palliative Care was asked to help address goals of care. Patient has had abscess to left axilla.   She has been seen by provider at PCP office and started on Keflex 4 days ago.  States that since she started the antibiotic she developed abscess to inside of left nare.  This does not seem to cause any problems with her breathing.  She has been using warm compresses with epsom salts on the abscesses and they have drained.  States having the seed in the center of biggest abscess on left axilla that is hard and does not drain but does continue to get drainage around it.  Denies fever.  She states that it has improved and it is decreased in size and not as tender.   Patient having increased depression with sleep disturbance and lack of appetite.  Patient's weight is down to 99 pounds from 110 in October 2021.  She has had to sell her horses that she has had for a long time and has had to put one of her dogs down due to health issues.  (She does state that she did have a couple of suicide attempts as a teenager and in her 59s. This was written in error.  Patient did not  disclose history of suicide attempts)  She denies SI today.    CODE STATUS: DNR  PPS: 40% HOSPICE ELIGIBILITY/DIAGNOSIS: TBD  PHYSICAL EXAM:   General: NAD, frail appearing, thin Eyes: sclera anicteric and noninjected with no drainage noted ENMT:  Moist mucous membranes Pulmonary: normal respiratory effort Extremities: no edema, no joint deformities Skin: abscesses to left axilla that are reddened with no drainage at this time and no warmth to the touch Neurological: Weakness but otherwise nonfocal; has some  forgetfulness  PAST MEDICAL HISTORY:  Past Medical History:  Diagnosis Date  . Cervical cancer ([place])   . Chronic idiopathic constipation   . Community acquired pneumonia of right upper lobe of lung 12/10/2016  . COPD (chronic obstructive pulmonary disease) ([place])   . Depression   . Endometriosis   . HLD (hyperlipidemia)   . Smoker     SOCIAL HX:  Social History   Tobacco Use  . Smoking status: Current Every Day Smoker    Packs/day: 2.00    Years: 51.00    Pack years: 102.00    Types: Cigarettes  . Smokeless tobacco: Never Used  . Tobacco comment: 0.5PPD 07/06/2020  Substance Use Topics  . Alcohol use: Yes    Comment: occasional    ALLERGIES:  Allergies  Allergen Reactions  . Terbinafine And Related Diarrhea    Diarrheal illness while on terbinafine ?related to med  . Clarithromycin     Caused Thrush   . Imitrex [Sumatriptan] Other (See Comments)    Worsens migraine      PERTINENT MEDICATIONS:  Outpatient Encounter Medications as of 08/17/2020  Medication Sig  . albuterol (PROVENTIL) (2.5 MG/3ML) 0.083% nebulizer solution Take 3 mLs (2.5 mg total) by nebulization every 6 (six) hours as needed for wheezing or shortness of breath.  Marland Kitchen albuterol (VENTOLIN HFA) 108 (90 Base) MCG/ACT inhaler TAKE 2 PUFFS BY MOUTH EVERY 6 HOURS AS NEEDED FOR WHEEZE OR SHORTNESS OF BREATH  . Ascorbic Acid (VITAMIN C WITH ROSE HIPS) 500 MG tablet Take 500 mg by mouth daily.  Marland Kitchen aspirin EC 81 MG tablet Take 81 mg by mouth daily.  . benzonatate (TESSALON) 100 MG capsule Take 1 capsule (100 mg total) by mouth 3 (three) times daily as needed for cough.  . Budeson-Glycopyrrol-Formoterol (BREZTRI AEROSPHERE) 160-9-4.8 MCG/ACT AERO Inhale 2 puffs into the lungs in the morning and at bedtime.  Marland Kitchen buPROPion (WELLBUTRIN XL) 150 MG 24 hr tablet Take 1 tablet (150 mg total) by mouth daily.  . cephALEXin (KEFLEX) 500 MG capsule Take 1 capsule (500 mg total) by mouth 2 (two) times daily.  . cholecalciferol  (VITAMIN D3) 25 MCG (1000 UNIT) tablet Take 1,000 Units by mouth daily.  . fluticasone (FLONASE) 50 MCG/ACT nasal spray Place 2 sprays into both nostrils daily.  . folic acid (FOLVITE) 1 MG tablet Take 1 tablet (1 mg total) by mouth daily.  Marland Kitchen gabapentin (NEURONTIN) 100 MG capsule Take 1 capsule (100 mg total) by mouth 2 (two) times daily.  . hydrOXYzine (ATARAX/VISTARIL) 25 MG tablet Take 1 tablet (25 mg total) by mouth in the morning and at bedtime.  Marland Kitchen LINZESS 290 MCG CAPS capsule TAKE 1 CAPSULE EVERY DAY AS NEEDED FOR CONSTIPATION AS DIRECTED  . meclizine (ANTIVERT) 25 MG tablet Take 1 tablet (25 mg total) by mouth 3 (three) times daily as needed for dizziness.  . methocarbamol (ROBAXIN) 500 MG tablet TAKE 1 TABLET BY MOUTH 3 TIMES DAILY AS NEEDED FOR MUSCLE SPASMS (SEDATION PRECAUTIONS).  . mirtazapine (  REMERON) 15 MG tablet Take 1 tablet (15 mg total) by mouth at bedtime.  . Multiple Vitamin (MULTIVITAMIN WITH MINERALS) TABS tablet Take 1 tablet by mouth daily.  . naproxen (NAPROSYN) 375 MG tablet TAKE 1 TABLET BY MOUTH TWICE A DAY AS NEEDED FOR PAIN WITH FOOD  . omeprazole (PRILOSEC) 40 MG capsule Take 1 capsule (40 mg total) by mouth daily.  . OXYGEN Inhale 4 L/min into the lungs continuous.   . polyethylene glycol powder (GLYCOLAX/MIRALAX) 17 GM/SCOOP powder Take 17 g by mouth daily as needed for moderate constipation.  . predniSONE (DELTASONE) 5 MG tablet Take 2 tablets daily with breakfast or as directed  . sertraline (ZOLOFT) 100 MG tablet Take 1.5 tablets (150 mg total) by mouth daily.  Marland Kitchen thiamine 100 MG tablet Take 1 tablet (100 mg total) by mouth daily.  . vitamin B-12 (CYANOCOBALAMIN) 1000 MCG tablet Take 1 tablet (1,000 mcg total) by mouth daily.  . [DISCONTINUED] budesonide (PULMICORT) 0.5 MG/2ML nebulizer solution Take 2 mLs (0.5 mg total) by nebulization 2 (two) times daily.   No facility-administered encounter medications on file as of 08/17/2020.    Amy Jenetta Downer, NP

## 2020-08-18 ENCOUNTER — Telehealth: Payer: Self-pay | Admitting: Adult Health Nurse Practitioner

## 2020-08-18 DIAGNOSIS — L732 Hidradenitis suppurativa: Secondary | ICD-10-CM | POA: Diagnosis not present

## 2020-08-18 DIAGNOSIS — L0291 Cutaneous abscess, unspecified: Secondary | ICD-10-CM | POA: Diagnosis not present

## 2020-08-18 NOTE — Telephone Encounter (Signed)
Spoke with patient to update on PCS eligibility with Medicaid.  States she has Medicaid but unable to locate card and information is not in Epic.  She may not have Medicaid other than what covers her insurance premiums.  Will confirm with office.   Walfred Bettendorf K. Olena Heckle NP

## 2020-08-19 DIAGNOSIS — J449 Chronic obstructive pulmonary disease, unspecified: Secondary | ICD-10-CM | POA: Diagnosis not present

## 2020-08-21 ENCOUNTER — Telehealth: Payer: Self-pay | Admitting: *Deleted

## 2020-08-21 NOTE — Telephone Encounter (Signed)
Spoke with pt asking for update.  States she is doing a lot better and with the abx.  Says she was told she needs to see a Psychologist, sport and exercise.  Offered OV on Wed, 08/23/20.  Pt agrees.

## 2020-08-21 NOTE — Telephone Encounter (Signed)
How is she feeling after I&D and on new clindamycin antibiotic?  Recommend OV to start - may place in 12:30pm slot on Wednesday.

## 2020-08-21 NOTE — Telephone Encounter (Signed)
Patient left a voicemail stating that she was advised to go to an Urgent Care by Dr. Danise Mina. Patient stated that she went to the UC (see notes). Patient stated that they lanced 3 abscesses under her arm. Patient stated that she was advised that she needs to see a Psychologist, sport and exercise. Patient wants to know if she needs to come in or if she can be referred to the surgeon.

## 2020-08-23 ENCOUNTER — Other Ambulatory Visit: Payer: Self-pay

## 2020-08-23 ENCOUNTER — Telehealth: Payer: Self-pay | Admitting: Family Medicine

## 2020-08-23 ENCOUNTER — Ambulatory Visit (INDEPENDENT_AMBULATORY_CARE_PROVIDER_SITE_OTHER)
Admission: RE | Admit: 2020-08-23 | Discharge: 2020-08-23 | Disposition: A | Discharge status: Home / Self Care | Payer: Medicare Other | Source: Ambulatory Visit | Attending: Family Medicine | Admitting: Family Medicine

## 2020-08-23 ENCOUNTER — Ambulatory Visit (INDEPENDENT_AMBULATORY_CARE_PROVIDER_SITE_OTHER): Payer: Medicare Other | Admitting: Family Medicine

## 2020-08-23 ENCOUNTER — Encounter: Payer: Self-pay | Admitting: Family Medicine

## 2020-08-23 VITALS — BP 110/80 | HR 112 | Temp 98.3°F | Ht 62.5 in | Wt 97.4 lb

## 2020-08-23 DIAGNOSIS — J449 Chronic obstructive pulmonary disease, unspecified: Secondary | ICD-10-CM | POA: Diagnosis not present

## 2020-08-23 DIAGNOSIS — A4902 Methicillin resistant Staphylococcus aureus infection, unspecified site: Secondary | ICD-10-CM | POA: Diagnosis not present

## 2020-08-23 DIAGNOSIS — L02412 Cutaneous abscess of left axilla: Secondary | ICD-10-CM

## 2020-08-23 DIAGNOSIS — R0600 Dyspnea, unspecified: Secondary | ICD-10-CM

## 2020-08-23 DIAGNOSIS — J439 Emphysema, unspecified: Secondary | ICD-10-CM | POA: Diagnosis not present

## 2020-08-23 DIAGNOSIS — J479 Bronchiectasis, uncomplicated: Secondary | ICD-10-CM | POA: Diagnosis not present

## 2020-08-23 DIAGNOSIS — R0989 Other specified symptoms and signs involving the circulatory and respiratory systems: Secondary | ICD-10-CM | POA: Diagnosis not present

## 2020-08-23 DIAGNOSIS — R0609 Other forms of dyspnea: Secondary | ICD-10-CM

## 2020-08-23 NOTE — Assessment & Plan Note (Signed)
Progressive, associated with RLL coarse breath sounds ?rales. Update CXR.

## 2020-08-23 NOTE — Progress Notes (Signed)
Patient ID: Christine Serrano, female    DOB: 12-Jan-1961, 60 y.o.   MRN: [id number]  This visit was conducted in person.  BP 110/80   Pulse (!) 112   Temp 98.3 F (36.8 C) (Temporal)   Ht 5' 2.5" (1.588 m)   Wt 97 lb 7 oz (44.2 kg)   LMP  (LMP Unknown)   SpO2 93% Comment: 2 L  BMI 17.54 kg/m    CC: abscess, dyspnea Subjective:   HPI: Christine Serrano is a 60 y.o. female presenting on 08/23/2020 for Abscess (Here for f/u of abscess in right axilla. ) and Shortness of Breath (C/o recent SOB and productive cough. )   Saw Dr Glori Bickers last week with dx L axillary boil - treated with keflex 500mg  TID course without resolution. Seen at [facility] late last week, treated with superficial I&D of abscess and clindamycin 300mg  QID antibiotic x10d course (started 08/19/2020), concern for hidradenitis suppurativa so rec gen surg eval.   Here for f/u - since I&D and new antibiotic doing much better Wound culture grew MRSA. Never fevers/chills, nausea.   Over last few days noticing increasing dyspnea with exertion (walking from one room to another).  Severe COPD - continues breztri 2 puffs bid and prednisone 10mg  daily, albuterol PRN. Again remains confused about medications. States she's also takign budesonide nebulzation twice daily in addition to breztri.      Relevant past medical, surgical, family and social history reviewed and updated as indicated. Interim medical history since our last visit reviewed. Allergies and medications reviewed and updated. Outpatient Medications Prior to Visit  Medication Sig Dispense Refill  . albuterol (PROVENTIL) (2.5 MG/3ML) 0.083% nebulizer solution Take 3 mLs (2.5 mg total) by nebulization every 6 (six) hours as needed for wheezing or shortness of breath. 75 mL 6  . albuterol (VENTOLIN HFA) 108 (90 Base) MCG/ACT inhaler TAKE 2 PUFFS BY MOUTH EVERY 6 HOURS AS NEEDED FOR WHEEZE OR SHORTNESS OF BREATH 16 each 11  . Ascorbic Acid (VITAMIN C WITH ROSE HIPS) 500 MG tablet  Take 500 mg by mouth daily.    Marland Kitchen aspirin EC 81 MG tablet Take 81 mg by mouth daily.    . benzonatate (TESSALON) 100 MG capsule Take 1 capsule (100 mg total) by mouth 3 (three) times daily as needed for cough. 30 capsule 11  . Budeson-Glycopyrrol-Formoterol (BREZTRI AEROSPHERE) 160-9-4.8 MCG/ACT AERO Inhale 2 puffs into the lungs in the morning and at bedtime. 10.7 g 11  . buPROPion (WELLBUTRIN XL) 150 MG 24 hr tablet Take 1 tablet (150 mg total) by mouth daily. 90 tablet 3  . cholecalciferol (VITAMIN D3) 25 MCG (1000 UNIT) tablet Take 1,000 Units by mouth daily.    . fluticasone (FLONASE) 50 MCG/ACT nasal spray Place 2 sprays into both nostrils daily. 48 g 3  . folic acid (FOLVITE) 1 MG tablet Take 1 tablet (1 mg total) by mouth daily. 90 tablet 3  . gabapentin (NEURONTIN) 100 MG capsule Take 1 capsule (100 mg total) by mouth 2 (two) times daily. 180 capsule 3  . hydrOXYzine (ATARAX/VISTARIL) 25 MG tablet Take 1 tablet (25 mg total) by mouth in the morning and at bedtime. 180 tablet 1  . LINZESS 290 MCG CAPS capsule TAKE 1 CAPSULE EVERY DAY AS NEEDED FOR CONSTIPATION AS DIRECTED 90 capsule 3  . meclizine (ANTIVERT) 25 MG tablet Take 1 tablet (25 mg total) by mouth 3 (three) times daily as needed for dizziness. 30 tablet 0  .  methocarbamol (ROBAXIN) 500 MG tablet TAKE 1 TABLET BY MOUTH 3 TIMES DAILY AS NEEDED FOR MUSCLE SPASMS (SEDATION PRECAUTIONS). 90 tablet 1  . mirtazapine (REMERON) 15 MG tablet Take 1 tablet (15 mg total) by mouth at bedtime. 90 tablet 3  . Multiple Vitamin (MULTIVITAMIN WITH MINERALS) TABS tablet Take 1 tablet by mouth daily.    . naproxen (NAPROSYN) 375 MG tablet TAKE 1 TABLET BY MOUTH TWICE A DAY AS NEEDED FOR PAIN WITH FOOD 40 tablet 3  . omeprazole (PRILOSEC) 40 MG capsule Take 1 capsule (40 mg total) by mouth daily. 90 capsule 3  . OXYGEN Inhale 4 L/min into the lungs continuous.     . polyethylene glycol powder (GLYCOLAX/MIRALAX) 17 GM/SCOOP powder Take 17 g by mouth  daily as needed for moderate constipation. 3350 g 1  . predniSONE (DELTASONE) 5 MG tablet Take 2 tablets daily with breakfast or as directed 60 tablet 3  . sertraline (ZOLOFT) 100 MG tablet Take 1.5 tablets (150 mg total) by mouth daily. 135 tablet 3  . thiamine 100 MG tablet Take 1 tablet (100 mg total) by mouth daily. 30 tablet 0  . vitamin B-12 (CYANOCOBALAMIN) 1000 MCG tablet Take 1 tablet (1,000 mcg total) by mouth daily.    . budesonide (PULMICORT) 0.5 MG/2ML nebulizer solution Take 2 mLs (0.5 mg total) by nebulization 2 (two) times daily.    . clindamycin (CLEOCIN) 300 MG capsule Take by mouth.    . cephALEXin (KEFLEX) 500 MG capsule Take 1 capsule (500 mg total) by mouth 2 (two) times daily. 21 capsule 0   No facility-administered medications prior to visit.     Per HPI unless specifically indicated in ROS section below Review of Systems Objective:  BP 110/80   Pulse (!) 112   Temp 98.3 F (36.8 C) (Temporal)   Ht 5' 2.5" (1.588 m)   Wt 97 lb 7 oz (44.2 kg)   LMP  (LMP Unknown)   SpO2 93% Comment: 2 L  BMI 17.54 kg/m   Wt Readings from Last 3 Encounters:  08/23/20 97 lb 7 oz (44.2 kg)  08/14/20 99 lb 4 oz (45 kg)  07/11/20 102 lb 7 oz (46.5 kg)      Physical Exam Vitals and nursing note reviewed.  Constitutional:      Appearance: Normal appearance. She is underweight. She is not ill-appearing.     Comments: Supplemental O2 by [place]  Cardiovascular:     Rate and Rhythm: Normal rate and regular rhythm.     Pulses: Normal pulses.     Heart sounds: Normal heart sounds. No murmur heard.   Pulmonary:     Effort: Pulmonary effort is normal. No respiratory distress.     Breath sounds: Rhonchi (RLL) present. No wheezing.     Comments: Coarse breath sounds RLL Skin:    General: Skin is warm and dry.     Findings: No erythema or rash.     Comments: Healing incisions x2 to L axilla with mild serous drainage present without surrounding erythema, induration   Neurological:      Mental Status: She is alert.       Results for orders placed or performed in visit on 96/29/52  Basic metabolic panel  Result Value Ref Range   Sodium 141 135 - 145 mEq/L   Potassium 3.8 3.5 - 5.1 mEq/L   Chloride 101 96 - 112 mEq/L   CO2 36 (H) 19 - 32 mEq/L   Glucose, Bld 72 70 -  99 mg/dL   BUN 6 6 - 23 mg/dL   Creatinine, Ser 0.49 0.40 - 1.20 mg/dL   GFR 101.63 >60.00 mL/min   Calcium 9.0 8.4 - 10.5 mg/dL   Assessment & Plan:  This visit occurred during the SARS-CoV-2 public health emergency.  Safety protocols were in place, including screening questions prior to the visit, additional usage of staff PPE, and extensive cleaning of exam room while observing appropriate contact time as indicated for disinfecting solutions.   Problem List Items Addressed This Visit    COPD, very severe ([place])    Continue Breztri 2 puffs BID, she also takes albuterol inh PRN.  She states she's also taking separate budesonide nebulizations - advised to clarify with pulm.  She states she's also taking a green inhaler regularly - will call us with name of this inhaler.       Relevant Medications   budesonide (PULMICORT) 0.5 MG/2ML nebulizer solution   Abscess of axilla, left - Primary    Significant improvement after I&D at [facility] last week. This is not consistent with hydradenitis, don't think we need gen surg eval at this time given significant improvement already.  Discussed finish clindamycin course for wound culture confirmed MRSA.       MRSA infection    Finishing clinda course      Relevant Medications   clindamycin (CLEOCIN) 300 MG capsule   Dyspnea on exertion    Progressive, associated with RLL coarse breath sounds ?rales. Update CXR.       Relevant Orders   DG Chest 2 View       No orders of the defined types were placed in this encounter.  Orders Placed This Encounter  Procedures  . DG Chest 2 View    Standing Status:   Future    Number of Occurrences:   1    Standing  Expiration Date:   08/23/2021    Order Specific Question:   Reason for Exam (SYMPTOM  OR DIAGNOSIS REQUIRED)    Answer:   RLL rales, dyspnea, chronic severe COPD    Order Specific Question:   Preferred imaging location?    Answer:   [facility]    Patient Instructions  Call us with name of green inhaler you're using.  Chest xray today  Finish clindamycin antibiotic.  Wound is healing well. Let us know if streaking redness or fever develops.    Follow up plan: Return if symptoms worsen or fail to improve.  Ria Bush, MD

## 2020-08-23 NOTE — Patient Instructions (Addendum)
Call us with name of green inhaler you're using.  Chest xray today  Finish clindamycin antibiotic.  Wound is healing well. Let us know if streaking redness or fever develops.

## 2020-08-23 NOTE — Telephone Encounter (Signed)
Patient was seen today by Dr.G.  Dr Darnell Level asked patient to call back with the name of a medication.  The medication is Spiriva Respimat 2.5 mcg. Inhaler.

## 2020-08-23 NOTE — Assessment & Plan Note (Signed)
Continue Breztri 2 puffs BID, she also takes albuterol inh PRN.  She states she's also taking separate budesonide nebulizations - advised to clarify with pulm.  She states she's also taking a green inhaler regularly - will call us with name of this inhaler.

## 2020-08-23 NOTE — Telephone Encounter (Signed)
Thanks for the call. plz call back -  Her current COPD regimen is Breztri 2 puffs BID, prednisone 10mg  daily, and albuterol inhaler PRN.  She should stop her spiriva inhaler and her budesonide nebulizers as the Breztri replaces both these medications. Will cc Dr Patsey Berthold to ensure this is correct.

## 2020-08-23 NOTE — Assessment & Plan Note (Signed)
Finishing clinda course

## 2020-08-23 NOTE — Assessment & Plan Note (Signed)
Significant improvement after I&D at [facility] last week. This is not consistent with hydradenitis, don't think we need gen surg eval at this time given significant improvement already.  Discussed finish clindamycin course for wound culture confirmed MRSA.

## 2020-08-24 DIAGNOSIS — M6240 Contracture of muscle, unspecified site: Secondary | ICD-10-CM | POA: Diagnosis not present

## 2020-08-24 DIAGNOSIS — M9904 Segmental and somatic dysfunction of sacral region: Secondary | ICD-10-CM | POA: Diagnosis not present

## 2020-08-24 DIAGNOSIS — M9901 Segmental and somatic dysfunction of cervical region: Secondary | ICD-10-CM | POA: Diagnosis not present

## 2020-08-24 DIAGNOSIS — M9903 Segmental and somatic dysfunction of lumbar region: Secondary | ICD-10-CM | POA: Diagnosis not present

## 2020-08-24 DIAGNOSIS — M9905 Segmental and somatic dysfunction of pelvic region: Secondary | ICD-10-CM | POA: Diagnosis not present

## 2020-08-24 DIAGNOSIS — M9902 Segmental and somatic dysfunction of thoracic region: Secondary | ICD-10-CM | POA: Diagnosis not present

## 2020-08-24 DIAGNOSIS — M5413 Radiculopathy, cervicothoracic region: Secondary | ICD-10-CM | POA: Diagnosis not present

## 2020-08-24 NOTE — Telephone Encounter (Signed)
Lvm asking pt to call back.  Need to relay Dr. G's message.  

## 2020-08-24 NOTE — Telephone Encounter (Signed)
Pt returning call.  I relayed Dr. Synthia Innocent message.  Pt verbalizes understanding but asks if she still would use albuterol neb tx PRN or just albuterol inhaler PRN.  Plz advise.

## 2020-08-24 NOTE — Telephone Encounter (Signed)
May use one or the other as needed. Would prefer neb when at home.

## 2020-08-25 ENCOUNTER — Telehealth: Payer: Self-pay

## 2020-08-25 NOTE — Telephone Encounter (Signed)
Yes her regimen is correct.  She has also been instructed not to use Spiriva with Breztri previously.  Spiriva was a prescription given during one of her hospitalizations.  She should use as needed albuterol as nebulizer when at home and MDI nebulizer not available.

## 2020-08-25 NOTE — Telephone Encounter (Signed)
Pt returned call. I went over instructions regarding nebulizer med and what Dr. Darnell Level and Dr. Patsey Berthold said. Pt verbalized understanding and then stated she should only use the Spiriva inhaler at night. I restated both providers comments regarding the Spiriva inhaler and how she should not be using it. I then went over pt's COPD regiment again with her:   Breztri 2 puffs BID, prednisone 10mg  daily, and albuterol inhaler PRN.   Pt verbalized understanding after repeating her regiment again. I again advised pt to make sure she isn't talking the Spiriva at all.

## 2020-08-25 NOTE — Telephone Encounter (Signed)
Received faxed Detailed Product Description from [place] for power w/c.  Placed form in Dr. Synthia Innocent box.

## 2020-08-25 NOTE — Telephone Encounter (Signed)
Lvm asking pt to call back.  Need to relay Dr. G's message.  

## 2020-08-25 NOTE — Telephone Encounter (Signed)
Noted  

## 2020-08-27 NOTE — Telephone Encounter (Signed)
Filled and in Lisa's box 

## 2020-08-28 ENCOUNTER — Other Ambulatory Visit: Payer: Self-pay | Admitting: Family Medicine

## 2020-08-28 ENCOUNTER — Telehealth: Payer: Self-pay | Admitting: Family Medicine

## 2020-08-28 NOTE — Telephone Encounter (Signed)
Patient returned your call. EM 

## 2020-08-28 NOTE — Telephone Encounter (Signed)
Faxed form.

## 2020-08-28 NOTE — Telephone Encounter (Signed)
Spoke with pt.  (see Imaging Result Notes, 08/23/20)

## 2020-08-30 ENCOUNTER — Other Ambulatory Visit: Payer: Self-pay | Admitting: Family Medicine

## 2020-08-30 DIAGNOSIS — J441 Chronic obstructive pulmonary disease with (acute) exacerbation: Secondary | ICD-10-CM

## 2020-08-30 NOTE — Telephone Encounter (Signed)
Refill request Symbicort No longer on medication list, looks like it was discontinued 10/18/19 by Dr. Patsey Berthold Last office visit 08/23/20

## 2020-08-31 DIAGNOSIS — M9904 Segmental and somatic dysfunction of sacral region: Secondary | ICD-10-CM | POA: Diagnosis not present

## 2020-08-31 DIAGNOSIS — M9901 Segmental and somatic dysfunction of cervical region: Secondary | ICD-10-CM | POA: Diagnosis not present

## 2020-08-31 DIAGNOSIS — M9903 Segmental and somatic dysfunction of lumbar region: Secondary | ICD-10-CM | POA: Diagnosis not present

## 2020-08-31 DIAGNOSIS — M9905 Segmental and somatic dysfunction of pelvic region: Secondary | ICD-10-CM | POA: Diagnosis not present

## 2020-08-31 DIAGNOSIS — M6240 Contracture of muscle, unspecified site: Secondary | ICD-10-CM | POA: Diagnosis not present

## 2020-09-04 ENCOUNTER — Ambulatory Visit (INDEPENDENT_AMBULATORY_CARE_PROVIDER_SITE_OTHER): Payer: Medicare Other | Admitting: Psychologist

## 2020-09-04 ENCOUNTER — Other Ambulatory Visit: Payer: Medicare Other | Admitting: Adult Health Nurse Practitioner

## 2020-09-04 ENCOUNTER — Other Ambulatory Visit: Payer: Self-pay

## 2020-09-04 DIAGNOSIS — Z9981 Dependence on supplemental oxygen: Secondary | ICD-10-CM

## 2020-09-04 DIAGNOSIS — J9611 Chronic respiratory failure with hypoxia: Secondary | ICD-10-CM | POA: Diagnosis not present

## 2020-09-04 DIAGNOSIS — F339 Major depressive disorder, recurrent, unspecified: Secondary | ICD-10-CM

## 2020-09-04 DIAGNOSIS — F331 Major depressive disorder, recurrent, moderate: Secondary | ICD-10-CM

## 2020-09-04 DIAGNOSIS — Z515 Encounter for palliative care: Secondary | ICD-10-CM

## 2020-09-04 NOTE — Progress Notes (Signed)
[facility] Note Telephone: [phone]  Fax: [phone]  PATIENT NAME: Christine Serrano DOB: Jul 30, 1960 MRN: [id number]  PRIMARY CARE PROVIDER:   Ria Bush, MD  REFERRING PROVIDER:  Ria Bush, MD [place],  [place] [id number]  RESPONSIBLE PARTY:   [facility] [place] [phone]  Chief complaint:  Follow up palliative visit/depression  Significant other present during visit today in patient's home   RECOMMENDATIONS and PLAN: 1.Advanced care planning. Patient is DNR  2.  COPD.  Patient has appointment with pulmonologist next week.  Have encouraged using mucinex to help with cough.  Continue follow up and recommendations by pulmonologist  3.  Depression.  Patient has virtual appointment this afternoon with psychiatry.  Continue follow up and recommendations with psychiatry.    Discussed with patient today that her symptoms could be related to possible lung infection, disease progression of her COPD with possible increased CO2 trapping, depression, combination of all of these.  Had brief discussion of starting hospice.  Palliative will continue to monitor for symptom management/decline and make recommendations as needed.  Next appointment in 4 weeks.  Encouraged to call with any questions or concerns.   HISTORY OF PRESENT ILLNESS:  Christine Serrano is a 60 y.o. year old female with multiple medical problems including severe COPD, HLD, depression. Palliative Care was asked to help address goals of care. Reviewed medical chart including most recent labs and imaging. Patient having increased coughing.  States that it is sometimes productive and other times hard to cough up.  Denies fever or increased SOB.  Has had recent PCP appointment that showed abnormal chest xray.  Patient states that PCP is coordinating with pulmonologist for treatment.  Patient is having increased  forgetfulness and SO states that she is having episodes of quickly getting off topic and at times babbling and not making sense.  Not having any facial drooping, headaches, dizziness, increased weakness to one side.  Patient continues to have decreased appetite and is now down to 97 pounds.  She has started supplementing with Ensure.  States that she gets tired with activity including trying to prepare and eat a meal.  She had I&D for her abscesses and these are improved.    CODE STATUS: DNR  PPS: 40% HOSPICE ELIGIBILITY/DIAGNOSIS: TBD  PHYSICAL EXAM:  BP 108/62  HR 105  O2 99% on 4 L General: NAD, frail appearing, thin Eyes:  Sclera anicteric and noninjected with no discharge noted ENMT: moist mucous membranes Cardiovascular: regular rate and rhythm Pulmonary: lung sounds diminished with decreased air movement on right side.  No adventitious breath sounds heard.  Normal respiratory effort Abdomen: soft, nontender, + bowel sounds Extremities: no edema, no joint deformities Skin: no rashes on exposed skin Neurological: Weakness but otherwise nonfocal; having forgetfulness   PAST MEDICAL HISTORY:  Past Medical History:  Diagnosis Date  . Cervical cancer ([place])   . Chronic idiopathic constipation   . Community acquired pneumonia of right upper lobe of lung 12/10/2016  . COPD (chronic obstructive pulmonary disease) ([place])   . Depression   . Endometriosis   . HLD (hyperlipidemia)   . Smoker     SOCIAL HX:  Social History   Tobacco Use  . Smoking status: Current Every Day Smoker    Packs/day: 2.00    Years: 51.00    Pack years: 102.00    Types: Cigarettes  . Smokeless tobacco: Never Used  . Tobacco comment:  0.5PPD 07/06/2020  Substance Use Topics  . Alcohol use: Yes    Comment: occasional    ALLERGIES:  Allergies  Allergen Reactions  . Terbinafine And Related Diarrhea    Diarrheal illness while on terbinafine ?related to med  . Clarithromycin     Caused Thrush   . Imitrex  [Sumatriptan] Other (See Comments)    Worsens migraine      PERTINENT MEDICATIONS:  Outpatient Encounter Medications as of 09/04/2020  Medication Sig  . albuterol (PROVENTIL) (2.5 MG/3ML) 0.083% nebulizer solution Take 3 mLs (2.5 mg total) by nebulization every 6 (six) hours as needed for wheezing or shortness of breath.  Marland Kitchen albuterol (VENTOLIN HFA) 108 (90 Base) MCG/ACT inhaler TAKE 2 PUFFS BY MOUTH EVERY 6 HOURS AS NEEDED FOR WHEEZE OR SHORTNESS OF BREATH  . Ascorbic Acid (VITAMIN C WITH ROSE HIPS) 500 MG tablet Take 500 mg by mouth daily.  Marland Kitchen aspirin EC 81 MG tablet Take 81 mg by mouth daily.  . benzonatate (TESSALON) 100 MG capsule Take 1 capsule (100 mg total) by mouth 3 (three) times daily as needed for cough.  . Budeson-Glycopyrrol-Formoterol (BREZTRI AEROSPHERE) 160-9-4.8 MCG/ACT AERO Inhale 2 puffs into the lungs in the morning and at bedtime.  Marland Kitchen buPROPion (WELLBUTRIN XL) 150 MG 24 hr tablet Take 1 tablet (150 mg total) by mouth daily.  . cholecalciferol (VITAMIN D3) 25 MCG (1000 UNIT) tablet Take 1,000 Units by mouth daily.  . clindamycin (CLEOCIN) 300 MG capsule Take by mouth.  . fluticasone (FLONASE) 50 MCG/ACT nasal spray Place 2 sprays into both nostrils daily.  . folic acid (FOLVITE) 1 MG tablet Take 1 tablet (1 mg total) by mouth daily.  Marland Kitchen gabapentin (NEURONTIN) 100 MG capsule Take 1 capsule (100 mg total) by mouth 2 (two) times daily.  . hydrOXYzine (ATARAX/VISTARIL) 25 MG tablet Take 1 tablet (25 mg total) by mouth in the morning and at bedtime.  Marland Kitchen LINZESS 290 MCG CAPS capsule TAKE 1 CAPSULE EVERY DAY AS NEEDED FOR CONSTIPATION AS DIRECTED  . meclizine (ANTIVERT) 25 MG tablet Take 1 tablet (25 mg total) by mouth 3 (three) times daily as needed for dizziness.  . methocarbamol (ROBAXIN) 500 MG tablet TAKE 1 TABLET BY MOUTH 3 TIMES DAILY AS NEEDED FOR MUSCLE SPASMS (SEDATION PRECAUTIONS).  . mirtazapine (REMERON) 15 MG tablet Take 1 tablet (15 mg total) by mouth at bedtime.  .  Multiple Vitamin (MULTIVITAMIN WITH MINERALS) TABS tablet Take 1 tablet by mouth daily.  . naproxen (NAPROSYN) 375 MG tablet TAKE 1 TABLET BY MOUTH TWICE A DAY AS NEEDED FOR PAIN WITH FOOD  . omeprazole (PRILOSEC) 40 MG capsule Take 1 capsule (40 mg total) by mouth daily.  . OXYGEN Inhale 4 L/min into the lungs continuous.   . polyethylene glycol powder (GLYCOLAX/MIRALAX) 17 GM/SCOOP powder Take 17 g by mouth daily as needed for moderate constipation.  . predniSONE (DELTASONE) 5 MG tablet Take 2 tablets daily with breakfast or as directed  . sertraline (ZOLOFT) 100 MG tablet Take 1.5 tablets (150 mg total) by mouth daily.  Marland Kitchen thiamine 100 MG tablet Take 1 tablet (100 mg total) by mouth daily.  . vitamin B-12 (CYANOCOBALAMIN) 1000 MCG tablet Take 1 tablet (1,000 mcg total) by mouth daily.   No facility-administered encounter medications on file as of 09/04/2020.     Eliane Hammersmith Jenetta Downer, NP

## 2020-09-05 DIAGNOSIS — J449 Chronic obstructive pulmonary disease, unspecified: Secondary | ICD-10-CM | POA: Diagnosis not present

## 2020-09-07 DIAGNOSIS — M9903 Segmental and somatic dysfunction of lumbar region: Secondary | ICD-10-CM | POA: Diagnosis not present

## 2020-09-07 DIAGNOSIS — M9905 Segmental and somatic dysfunction of pelvic region: Secondary | ICD-10-CM | POA: Diagnosis not present

## 2020-09-07 DIAGNOSIS — M9904 Segmental and somatic dysfunction of sacral region: Secondary | ICD-10-CM | POA: Diagnosis not present

## 2020-09-07 DIAGNOSIS — M9901 Segmental and somatic dysfunction of cervical region: Secondary | ICD-10-CM | POA: Diagnosis not present

## 2020-09-07 DIAGNOSIS — M5413 Radiculopathy, cervicothoracic region: Secondary | ICD-10-CM | POA: Diagnosis not present

## 2020-09-07 DIAGNOSIS — M9902 Segmental and somatic dysfunction of thoracic region: Secondary | ICD-10-CM | POA: Diagnosis not present

## 2020-09-07 DIAGNOSIS — M6240 Contracture of muscle, unspecified site: Secondary | ICD-10-CM | POA: Diagnosis not present

## 2020-09-12 ENCOUNTER — Other Ambulatory Visit: Payer: Self-pay

## 2020-09-12 ENCOUNTER — Encounter: Payer: Self-pay | Admitting: Pulmonary Disease

## 2020-09-12 ENCOUNTER — Ambulatory Visit (INDEPENDENT_AMBULATORY_CARE_PROVIDER_SITE_OTHER): Payer: Medicare Other | Admitting: Pulmonary Disease

## 2020-09-12 VITALS — BP 102/56 | HR 97 | Temp 98.3°F | Ht 64.0 in | Wt 98.4 lb

## 2020-09-12 DIAGNOSIS — R918 Other nonspecific abnormal finding of lung field: Secondary | ICD-10-CM | POA: Diagnosis not present

## 2020-09-12 DIAGNOSIS — R64 Cachexia: Secondary | ICD-10-CM | POA: Diagnosis not present

## 2020-09-12 DIAGNOSIS — J449 Chronic obstructive pulmonary disease, unspecified: Secondary | ICD-10-CM

## 2020-09-12 DIAGNOSIS — J9611 Chronic respiratory failure with hypoxia: Secondary | ICD-10-CM

## 2020-09-12 DIAGNOSIS — Z9981 Dependence on supplemental oxygen: Secondary | ICD-10-CM

## 2020-09-12 DIAGNOSIS — F17210 Nicotine dependence, cigarettes, uncomplicated: Secondary | ICD-10-CM | POA: Diagnosis not present

## 2020-09-12 NOTE — Patient Instructions (Addendum)
Continue using your Judithann Sauger twice a day  Continue prednisone  Continue your as needed albuterol  We will see you in follow-up in 2 months time call sooner should any new problems arise

## 2020-09-12 NOTE — Progress Notes (Signed)
Subjective:    Patient ID: Christine Serrano, female    DOB: 1961-04-20, 60 y.o.   MRN: [id number] Chief Complaint  Patient presents with   Consult    Severe COPD-  SOB    PT PROFILE: 60 year old F recalcitrant smoker with severe, oxygen-dependent COPD hospitalized multiple times for COPD exacerbation initially requiring NIPPV in 2017, former patient of Dr. Alva Garnet.   PROBLEMS: Recalcitrant smoker Chronic hypoxic respiratory failure Very severe COPD/emphysema Chronic bronchitis   DATA: CT chest 03/13/16: Severe emphysema with extensive disease in the upper lobes and superior segment of the right lower lobe. This could represent acute and/or chronic changes. In addition, there is an irregular spiculated density along the right minor fissure which is indeterminate. PFTs 01/15/18: FVC: 1.42 L (48 %pred), FEV1: 0.76 L (31 %pred), FEV1/FVC: 53% , TLC: 4.49 L (94 %pred), DLCO 30 %pred, DLCO/VA 51%. TLC likely underestimated by gas dilution technique. Severe Gas trapping with RV at 172% predicted. CT chest 10/13/2019: Severe emphysema particularly upper lobes with upper lobe predominant scarring and architectural distortion no significant change from November 25, 2016 CT.  Coronary artery calcifications and aortic calcification was noted. Chest x-ray 10/16/2019: Hyperinflated lungs with diffuse interstitial prominence, no acute infiltrate. CT chest 06/07/2020: Severe emphysema, predominantly upper lobes.  Upper lobe predominant scarring and architectural distortion, new right middle lobe atelectasis. PET/CT 06/30/2020: No evidence of malignancy, previously noted right middle lobe atelectasis resolved, likely inflammatory/infectious. Chest x-ray 08/23/2020: Possible superimposed acute infection on chronic changes, severe emphysema, query MAI.   INTERVAL:  Last seen 07/06/2020 by me.   Persistent shortness of breath, CT scan of the chest ordered due to weight loss, she has suspicious changes on CT, this  was followed by PET/CT showed no evidence of malignancy.    HPI Christine Serrano is a 60 year old current smoker (now up to 1 pack a day again) who presents for follow-up on severe COPD, chronic respiratory failure with hypoxia and abnormal chest x-ray.  Was last seen here in January 2022, please refer to that note for details.  This is a scheduled visit.  Continues to use Breztri 2 puffs twice a day and notes that this helps her.  She was noted in the interim to have some abnormalities on CT that were worrisome for malignancy and she had a PET/CT that showed no evidence of malignancy.  She has complained of weight loss this is likely related to pulmonary cachexia.  She has been on 10 mg of prednisone daily and feels that this helps her appetite as well as her shortness of breath.  She is compliant with her oxygen.  She voices no other complaint today.  Overall feels she is "as good as she is going to get".   Review of Systems A 10 point review of systems was performed and it is as noted above otherwise negative.   Patient Active Problem List   Diagnosis Date Noted   MRSA infection 08/23/2020   Dyspnea on exertion 08/23/2020   Abscess of axilla, left 08/14/2020   Low serum vitamin B12 06/09/2020   Cervical neck pain with evidence of disc disease 01/29/2020   Rhomboid muscle strain, subsequent encounter 11/10/2019   Palliative care status 10/23/2019   Impaired mobility 10/20/2019   Chronic respiratory failure with hypoxia, on home O2 therapy ([place]) 10/20/2019   Memory difficulties 10/16/2019   Folate deficiency 09/17/2019   Dizziness 09/11/2019   Anemia 09/11/2019   Hypocalcemia 57/84/6962   Systolic murmur 95/28/4132   Vertebral artery  stenosis, left 02/27/2019   Medicare annual wellness visit, subsequent 02/25/2019   Advanced directives, counseling/discussion 02/25/2019   Epistaxis 09/21/2018   COPD exacerbation ([place]) 09/02/2018   Osteoporosis 05/23/2018   Numbness of right hand 10/10/2017    Nasal sinus congestion 10/10/2017   Benign neoplasm of ascending colon    Benign neoplasm of transverse colon    Benign neoplasm of sigmoid colon    Positive hepatitis C antibody test 02/23/2017   Encounter for general adult medical examination with abnormal findings 02/21/2017   HLD (hyperlipidemia) 02/21/2017   Anxiety 12/10/2016   Tobacco abuse 11/28/2016   Aortic atherosclerosis ([place]) 11/26/2016   Tachycardia 11/01/2016   Pulmonary nodule 10/25/2016   Moderate recurrent major depression ([place] ) 07/03/2016   Chronic constipation 07/03/2016   Alcohol abuse, in remission 07/03/2016   COPD, very severe ([place]) 07/03/2016   Tremor 07/03/2016   Chronic insomnia 07/03/2016   Social History   Tobacco Use   Smoking status: Every Day    Packs/day: 3.00    Years: 51.00    Pack years: 153.00    Types: Cigarettes   Smokeless tobacco: Never   Tobacco comments:    1PPD   Substance Use Topics   Alcohol use: Yes    Comment: occasional   Allergies  Allergen Reactions   Terbinafine And Related Diarrhea    Diarrheal illness while on terbinafine ?related to med   Clarithromycin     Caused Thrush    Imitrex [Sumatriptan] Other (See Comments)    Worsens migraine    Current Meds  Medication Sig   albuterol (PROVENTIL) (2.5 MG/3ML) 0.083% nebulizer solution Take 3 mLs (2.5 mg total) by nebulization every 6 (six) hours as needed for wheezing or shortness of breath.   albuterol (VENTOLIN HFA) 108 (90 Base) MCG/ACT inhaler TAKE 2 PUFFS BY MOUTH EVERY 6 HOURS AS NEEDED FOR WHEEZE OR SHORTNESS OF BREATH   Ascorbic Acid (VITAMIN C WITH ROSE HIPS) 500 MG tablet Take 500 mg by mouth daily.   aspirin EC 81 MG tablet Take 81 mg by mouth daily.   benzonatate (TESSALON) 100 MG capsule Take 1 capsule (100 mg total) by mouth 3 (three) times daily as needed for cough.   Budeson-Glycopyrrol-Formoterol (BREZTRI AEROSPHERE) 160-9-4.8 MCG/ACT AERO Inhale 2 puffs into the lungs in the morning and at bedtime.    buPROPion (WELLBUTRIN XL) 150 MG 24 hr tablet Take 1 tablet (150 mg total) by mouth daily.   cholecalciferol (VITAMIN D3) 25 MCG (1000 UNIT) tablet Take 1,000 Units by mouth daily.   clindamycin (CLEOCIN) 300 MG capsule Take by mouth.   fluticasone (FLONASE) 50 MCG/ACT nasal spray Place 2 sprays into both nostrils daily.   folic acid (FOLVITE) 1 MG tablet Take 1 tablet (1 mg total) by mouth daily.   gabapentin (NEURONTIN) 100 MG capsule Take 1 capsule (100 mg total) by mouth 2 (two) times daily.   hydrOXYzine (ATARAX/VISTARIL) 25 MG tablet Take 1 tablet (25 mg total) by mouth in the morning and at bedtime.   LINZESS 290 MCG CAPS capsule TAKE 1 CAPSULE EVERY DAY AS NEEDED FOR CONSTIPATION AS DIRECTED   meclizine (ANTIVERT) 25 MG tablet Take 1 tablet (25 mg total) by mouth 3 (three) times daily as needed for dizziness.   methocarbamol (ROBAXIN) 500 MG tablet TAKE 1 TABLET BY MOUTH 3 TIMES DAILY AS NEEDED FOR MUSCLE SPASMS (SEDATION PRECAUTIONS).   mirtazapine (REMERON) 15 MG tablet Take 1 tablet (15 mg total) by mouth at bedtime.   Multiple  Vitamin (MULTIVITAMIN WITH MINERALS) TABS tablet Take 1 tablet by mouth daily.   naproxen (NAPROSYN) 375 MG tablet TAKE 1 TABLET BY MOUTH TWICE A DAY AS NEEDED FOR PAIN WITH FOOD   omeprazole (PRILOSEC) 40 MG capsule Take 1 capsule (40 mg total) by mouth daily.   OXYGEN Inhale 4 L/min into the lungs continuous.    polyethylene glycol powder (GLYCOLAX/MIRALAX) 17 GM/SCOOP powder Take 17 g by mouth daily as needed for moderate constipation.   predniSONE (DELTASONE) 5 MG tablet Take 2 tablets daily with breakfast or as directed   sertraline (ZOLOFT) 100 MG tablet Take 1.5 tablets (150 mg total) by mouth daily.   thiamine 100 MG tablet Take 1 tablet (100 mg total) by mouth daily.   vitamin B-12 (CYANOCOBALAMIN) 1000 MCG tablet Take 1 tablet (1,000 mcg total) by mouth daily.   Immunization History  Administered Date(s) Administered   Influenza Split 04/17/2016    Influenza,inj,Quad PF,6+ Mos 02/21/2017, 03/17/2018, 02/25/2019, 03/13/2020   Pneumococcal Conjugate-13 05/11/2018   Pneumococcal Polysaccharide-23 06/17/2008   Zoster Recombinat (Shingrix) 08/30/2020       Objective:   Physical Exam BP (!) 102/56 (BP Location: Right Arm, Patient Position: Sitting, Cuff Size: Normal)   Pulse 97   Temp 98.3 F (36.8 C) (Temporal)   Ht 5\' 4"  (1.626 m)   Wt 98 lb 6.4 oz (44.6 kg)   LMP  (LMP Unknown)   SpO2 100%   BMI 16.89 kg/m  GENERAL: Thin/cachectic woman, debilitated, looks older than stated age, presents in transport chair. HEAD: Normocephalic, atraumatic.  EYES: Pupils equal, round, reactive to light.  No scleral icterus.  MOUTH: Nose/mouth/throat not examined due to masking requirements for COVID 19. NECK: Supple. No thyromegaly. Trachea midline. No JVD.  No adenopathy. PULMONARY: Distant breath sounds.  Rhonchi, occasional end expiratory wheeze. CARDIOVASCULAR: S1 and S2. Regular rate and rhythm.  No rubs, murmurs or gallops heard. ABDOMEN: Scaphoid, otherwise benign.   MUSCULOSKELETAL: No joint deformity, no clubbing, no edema.  NEUROLOGIC: No overt focal deficit.  Speech is fluent.  Gait not observed.   SKIN: Intact,warm,dry.  On limited exam no rashes. PSYCH: Mood and behavior normal.  Chest x-ray performed 23 August 2020 showing severe COPD changes:     Assessment & Plan:     ICD-10-CM   1. COPD, very severe ([place])  J44.9    Continue Breztri 2 puffs twice a day She is end-stage Continue palliative care    2. Chronic respiratory failure with hypoxia, on home O2 therapy ([facility])  J96.11    Z99.81    Continue oxygen at current liter flow    3. 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 Abnormal CT lung screening  R91.8    No evidence of malignancy by PET/CT Recommend no further imaging    5. Tobacco dependence due to cigarettes  F17.210     Patient counseled regards to discontinuation of smoking Total counseling time 3 to 5 minutes     We will see the patient in follow-up in 2 months time call sooner should any new problems arise.  Renold Don, MD Advanced Bronchoscopy PCCM [facility]    *This note was dictated using voice recognition software/Dragon.  Despite best efforts to proofread, errors can occur which can change the meaning.  Any change was purely unintentional.

## 2020-09-14 DIAGNOSIS — M9903 Segmental and somatic dysfunction of lumbar region: Secondary | ICD-10-CM | POA: Diagnosis not present

## 2020-09-14 DIAGNOSIS — M5413 Radiculopathy, cervicothoracic region: Secondary | ICD-10-CM | POA: Diagnosis not present

## 2020-09-14 DIAGNOSIS — M9901 Segmental and somatic dysfunction of cervical region: Secondary | ICD-10-CM | POA: Diagnosis not present

## 2020-09-14 DIAGNOSIS — M9902 Segmental and somatic dysfunction of thoracic region: Secondary | ICD-10-CM | POA: Diagnosis not present

## 2020-09-14 DIAGNOSIS — M9904 Segmental and somatic dysfunction of sacral region: Secondary | ICD-10-CM | POA: Diagnosis not present

## 2020-09-14 DIAGNOSIS — M9905 Segmental and somatic dysfunction of pelvic region: Secondary | ICD-10-CM | POA: Diagnosis not present

## 2020-09-14 DIAGNOSIS — M6240 Contracture of muscle, unspecified site: Secondary | ICD-10-CM | POA: Diagnosis not present

## 2020-09-19 ENCOUNTER — Ambulatory Visit (INDEPENDENT_AMBULATORY_CARE_PROVIDER_SITE_OTHER): Payer: Medicare Other | Admitting: Psychologist

## 2020-09-19 DIAGNOSIS — F331 Major depressive disorder, recurrent, moderate: Secondary | ICD-10-CM | POA: Diagnosis not present

## 2020-09-19 DIAGNOSIS — J449 Chronic obstructive pulmonary disease, unspecified: Secondary | ICD-10-CM | POA: Diagnosis not present

## 2020-09-21 DIAGNOSIS — M9905 Segmental and somatic dysfunction of pelvic region: Secondary | ICD-10-CM | POA: Diagnosis not present

## 2020-09-21 DIAGNOSIS — M9902 Segmental and somatic dysfunction of thoracic region: Secondary | ICD-10-CM | POA: Diagnosis not present

## 2020-09-21 DIAGNOSIS — M9904 Segmental and somatic dysfunction of sacral region: Secondary | ICD-10-CM | POA: Diagnosis not present

## 2020-09-21 DIAGNOSIS — M5413 Radiculopathy, cervicothoracic region: Secondary | ICD-10-CM | POA: Diagnosis not present

## 2020-09-21 DIAGNOSIS — M6240 Contracture of muscle, unspecified site: Secondary | ICD-10-CM | POA: Diagnosis not present

## 2020-09-21 DIAGNOSIS — M9903 Segmental and somatic dysfunction of lumbar region: Secondary | ICD-10-CM | POA: Diagnosis not present

## 2020-09-21 DIAGNOSIS — M9901 Segmental and somatic dysfunction of cervical region: Secondary | ICD-10-CM | POA: Diagnosis not present

## 2020-09-24 ENCOUNTER — Other Ambulatory Visit: Payer: Self-pay | Admitting: Family Medicine

## 2020-09-24 DIAGNOSIS — J441 Chronic obstructive pulmonary disease with (acute) exacerbation: Secondary | ICD-10-CM

## 2020-09-25 ENCOUNTER — Ambulatory Visit: Payer: Medicare Other | Admitting: Family Medicine

## 2020-09-26 ENCOUNTER — Encounter: Payer: Self-pay | Admitting: Family Medicine

## 2020-09-26 ENCOUNTER — Ambulatory Visit (INDEPENDENT_AMBULATORY_CARE_PROVIDER_SITE_OTHER): Payer: Medicare Other | Admitting: Family Medicine

## 2020-09-26 ENCOUNTER — Other Ambulatory Visit: Payer: Self-pay

## 2020-09-26 VITALS — BP 116/66 | HR 113 | Temp 99.1°F | Ht 64.0 in | Wt 98.1 lb

## 2020-09-26 DIAGNOSIS — R197 Diarrhea, unspecified: Secondary | ICD-10-CM

## 2020-09-26 LAB — COMPREHENSIVE METABOLIC PANEL
ALT: 12 U/L (ref 0–35)
AST: 21 U/L (ref 0–37)
Albumin: 3.6 g/dL (ref 3.5–5.2)
Alkaline Phosphatase: 73 U/L (ref 39–117)
BUN: 4 mg/dL — ABNORMAL LOW (ref 6–23)
CO2: 36 mEq/L — ABNORMAL HIGH (ref 19–32)
Calcium: 9.1 mg/dL (ref 8.4–10.5)
Chloride: 99 mEq/L (ref 96–112)
Creatinine, Ser: 0.72 mg/dL (ref 0.40–1.20)
GFR: 90.05 mL/min (ref >60.00)
Glucose, Bld: 77 mg/dL (ref 70–99)
Potassium: 4.3 mEq/L (ref 3.5–5.1)
Sodium: 141 mEq/L (ref 135–145)
Total Bilirubin: 0.4 mg/dL (ref 0.2–1.2)
Total Protein: 6 g/dL (ref 6.0–8.3)

## 2020-09-26 LAB — CBC WITH DIFFERENTIAL/PLATELET
Basophils Absolute: 0.1 10*3/uL (ref 0.0–0.1)
Basophils Relative: 0.7 % (ref 0.0–3.0)
Eosinophils Absolute: 0.1 10*3/uL (ref 0.0–0.7)
Eosinophils Relative: 1.3 % (ref 0.0–5.0)
HCT: 37 % (ref 36.0–46.0)
Hemoglobin: 12.3 g/dL (ref 12.0–15.0)
Lymphocytes Relative: 34.7 % (ref 12.0–46.0)
Lymphs Abs: 2.7 10*3/uL (ref 0.7–4.0)
MCHC: 33.1 g/dL (ref 30.0–36.0)
MCV: 82.6 fl (ref 78.0–100.0)
Monocytes Absolute: 0.6 10*3/uL (ref 0.1–1.0)
Monocytes Relative: 7.7 % (ref 3.0–12.0)
Neutro Abs: 4.3 10*3/uL (ref 1.4–7.7)
Neutrophils Relative %: 55.6 % (ref 43.0–77.0)
Platelets: 203 10*3/uL (ref 150.0–400.0)
RBC: 4.48 Mil/uL (ref 3.87–5.11)
RDW: 15.2 % (ref 11.5–15.5)
WBC: 7.8 10*3/uL (ref 4.0–10.5)

## 2020-09-26 LAB — TSH: TSH: 4.22 u[IU]/mL (ref 0.35–4.50)

## 2020-09-26 NOTE — Patient Instructions (Addendum)
Drop omeprazole dose to 40mg  every other day.  Labs today. Pass by lab for stool tests.  Hold miralax and linzess for now, may use imodium 1/2-1 tablet twice daily as needed for diarrhea. Stop using if any abdominal pain.  Diarrhea, Adult Diarrhea is when you pass loose and watery poop (stool) often. Diarrhea can make you feel weak and cause you to lose water in your body (get dehydrated). Losing water in your body can cause you to:  Feel tired and thirsty.  Have a dry mouth.  Go pee (urinate) less often. Diarrhea often lasts 2-3 days. However, it can last longer if it is a sign of something more serious. It is important to treat your diarrhea as told by your doctor. Follow these instructions at home: Eating and drinking Follow these instructions as told by your doctor:  Take an ORS (oral rehydration solution). This is a drink that helps you replace fluids and minerals your body lost. It is sold at pharmacies and stores.  Drink plenty of fluids, such as: ? Water. ? Ice chips. ? Diluted fruit juice. ? Low-calorie sports drinks. ? Milk, if you want.  Avoid drinking fluids that have a lot of sugar or caffeine in them.  Eat bland, easy-to-digest foods in small amounts as you are able. These foods include: ? Bananas. ? Applesauce. ? Rice. ? Low-fat (lean) meats. ? Toast. ? Crackers.  Avoid alcohol.  Avoid spicy or fatty foods.      Medicines  Take over-the-counter and prescription medicines only as told by your doctor.  If you were prescribed an antibiotic medicine, take it as told by your doctor. Do not stop using the antibiotic even if you start to feel better. General instructions  Wash your hands often using soap and water. If soap and water are not available, use a hand sanitizer. Others in your home should wash their hands as well. Hands should be washed: ? After using the toilet or changing a diaper. ? Before preparing, cooking, or serving food. ? While caring for  a sick person. ? While visiting someone in a hospital.  Drink enough fluid to keep your pee (urine) pale yellow.  Rest at home while you get better.  Watch your condition for any changes.  Take a warm bath to help with any burning or pain from having diarrhea.  Keep all follow-up visits as told by your doctor. This is important.   Contact a doctor if:  You have a fever.  Your diarrhea gets worse.  You have new symptoms.  You cannot keep fluids down.  You feel light-headed or dizzy.  You have a headache.  You have muscle cramps. Get help right away if:  You have chest pain.  You feel very weak or you pass out (faint).  You have bloody or black poop or poop that looks like tar.  You have very bad pain, cramping, or bloating in your belly (abdomen).  You have trouble breathing or you are breathing very quickly.  Your heart is beating very quickly.  Your skin feels cold and clammy.  You feel confused.  You have signs of losing too much water in your body, such as: ? Dark pee, very little pee, or no pee. ? Cracked lips. ? Dry mouth. ? Sunken eyes. ? Sleepiness. ? Weakness. Summary  Diarrhea is when you pass loose and watery poop (stool) often.  Diarrhea can make you feel weak and cause you to lose water in your body (get  dehydrated).  Take an ORS (oral rehydration solution). This is a drink that is sold at pharmacies and stores.  Eat bland, easy-to-digest foods in small amounts as you are able.  Contact a doctor if your condition gets worse. Get help right away if you have signs that you have lost too much water in your body. This information is not intended to replace advice given to you by your health care provider. Make sure you discuss any questions you have with your health care provider. Document Revised: 11/07/2017 Document Reviewed: 11/07/2017 Elsevier Patient Education  2021 [employer].

## 2020-09-26 NOTE — Assessment & Plan Note (Signed)
Of unclear cause. She will continue to hold linzess and miralax (in h/o CIC). Discussed may use imodium PRN.  Check labs, fecal elastase, and GI pathogen panel to further eval for other cause, r/o infection, pancreatic insufficiency, etc.  Encouraged ongoing efforts towards good hydration.  Drop omeprazole dose to 40mg  QOD.  She is on sertraline 150mg  daily - will need to consider SSRI induced microscopic colitis.

## 2020-09-26 NOTE — Addendum Note (Signed)
Addended by: Ellamae Sia on: 09/26/2020 10:46 AM   Modules accepted: Orders

## 2020-09-26 NOTE — Progress Notes (Signed)
Patient ID: Christine Serrano, female    DOB: November 18, 1960, 60 y.o.   MRN: [id number]  This visit was conducted in person.  BP 116/66   Pulse (!) 113   Temp 99.1 F (37.3 C) (Temporal)   Ht 5\' 4"  (1.626 m)   Wt 98 lb 1.6 oz (44.5 kg)   LMP  (LMP Unknown)   SpO2 100% Comment: 4 L  BMI 16.84 kg/m    CC: trouble controlling bowels  Subjective:   HPI: Christine Serrano is a 60 y.o. female presenting on 09/26/2020 for Encopresis (C/o trouble getting to bathroom in time for BMs.  Bowels are loose.   Started 2-3 wks ago. Pt accompanied by significant other, Angela. )   1.5 wks of frequent watery stools associated with stool urgency. About 10 BM/day. No bowel accidents so far. Hasn't needed diapers yet.  She feels she's staying well hydrated. She's drinking an ensure a day.   No fevers/chills, abd pain, nausea/vomiting, blood in stool, mucous in stool, bowel color changes or increased buoyancy or grease.  No sick contacts at home.  No new medicines or foods.  Uses well water - filtered.   Continues omeprazole 40mg  daily for GERD.   H/o chronic idiopathic constipation managed with linzess - she stopped linzess months ago because this had improved.  She also stopped miralax.   COLONOSCOPY WITH PROPOFOL 08/04/2017 - TAx3, angiodysplastic lesion, diverticulosis, rpt 5 yrs ([place])     Relevant past medical, surgical, family and social history reviewed and updated as indicated. Interim medical history since our last visit reviewed. Allergies and medications reviewed and updated. Outpatient Medications Prior to Visit  Medication Sig Dispense Refill  . albuterol (PROVENTIL) (2.5 MG/3ML) 0.083% nebulizer solution Take 3 mLs (2.5 mg total) by nebulization every 6 (six) hours as needed for wheezing or shortness of breath. 75 mL 6  . albuterol (VENTOLIN HFA) 108 (90 Base) MCG/ACT inhaler TAKE 2 PUFFS BY MOUTH EVERY 6 HOURS AS NEEDED FOR WHEEZE OR SHORTNESS OF BREATH 16 each 11  . Ascorbic  Acid (VITAMIN C WITH ROSE HIPS) 500 MG tablet Take 500 mg by mouth daily.    Marland Kitchen aspirin EC 81 MG tablet Take 81 mg by mouth daily.    . benzonatate (TESSALON) 100 MG capsule Take 1 capsule (100 mg total) by mouth 3 (three) times daily as needed for cough. 30 capsule 11  . Budeson-Glycopyrrol-Formoterol (BREZTRI AEROSPHERE) 160-9-4.8 MCG/ACT AERO Inhale 2 puffs into the lungs in the morning and at bedtime. 10.7 g 11  . buPROPion (WELLBUTRIN XL) 150 MG 24 hr tablet Take 1 tablet (150 mg total) by mouth daily. 90 tablet 3  . cholecalciferol (VITAMIN D3) 25 MCG (1000 UNIT) tablet Take 1,000 Units by mouth daily.    . clindamycin (CLEOCIN) 300 MG capsule Take by mouth.    . fluticasone (FLONASE) 50 MCG/ACT nasal spray Place 2 sprays into both nostrils daily. 48 g 3  . folic acid (FOLVITE) 1 MG tablet Take 1 tablet (1 mg total) by mouth daily. 90 tablet 3  . gabapentin (NEURONTIN) 100 MG capsule Take 1 capsule (100 mg total) by mouth 2 (two) times daily. 180 capsule 3  . hydrOXYzine (ATARAX/VISTARIL) 25 MG tablet Take 1 tablet (25 mg total) by mouth in the morning and at bedtime. 180 tablet 1  . LINZESS 290 MCG CAPS capsule TAKE 1 CAPSULE EVERY DAY AS NEEDED FOR CONSTIPATION AS DIRECTED 90 capsule 3  . meclizine (ANTIVERT) 25 MG tablet  Take 1 tablet (25 mg total) by mouth 3 (three) times daily as needed for dizziness. 30 tablet 0  . methocarbamol (ROBAXIN) 500 MG tablet TAKE 1 TABLET BY MOUTH 3 TIMES DAILY AS NEEDED FOR MUSCLE SPASMS (SEDATION PRECAUTIONS). 90 tablet 1  . mirtazapine (REMERON) 15 MG tablet Take 1 tablet (15 mg total) by mouth at bedtime. 90 tablet 3  . Multiple Vitamin (MULTIVITAMIN WITH MINERALS) TABS tablet Take 1 tablet by mouth daily.    . naproxen (NAPROSYN) 375 MG tablet TAKE 1 TABLET BY MOUTH TWICE A DAY AS NEEDED FOR PAIN WITH FOOD 40 tablet 3  . omeprazole (PRILOSEC) 40 MG capsule Take 1 capsule (40 mg total) by mouth daily. 90 capsule 3  . OXYGEN Inhale 4 L/min into the lungs  continuous.     . polyethylene glycol powder (GLYCOLAX/MIRALAX) 17 GM/SCOOP powder Take 17 g by mouth daily as needed for moderate constipation. 3350 g 1  . predniSONE (DELTASONE) 5 MG tablet Take 2 tablets daily with breakfast or as directed 60 tablet 3  . sertraline (ZOLOFT) 100 MG tablet Take 1.5 tablets (150 mg total) by mouth daily. 135 tablet 3  . thiamine 100 MG tablet Take 1 tablet (100 mg total) by mouth daily. 30 tablet 0  . vitamin B-12 (CYANOCOBALAMIN) 1000 MCG tablet Take 1 tablet (1,000 mcg total) by mouth daily.     No facility-administered medications prior to visit.     Per HPI unless specifically indicated in ROS section below Review of Systems Objective:  BP 116/66   Pulse (!) 113   Temp 99.1 F (37.3 C) (Temporal)   Ht 5\' 4"  (1.626 m)   Wt 98 lb 1.6 oz (44.5 kg)   LMP  (LMP Unknown)   SpO2 100% Comment: 4 L  BMI 16.84 kg/m   Wt Readings from Last 3 Encounters:  09/26/20 98 lb 1.6 oz (44.5 kg)  09/12/20 98 lb 6.4 oz (44.6 kg)  08/23/20 97 lb 7 oz (44.2 kg)      Physical Exam Vitals and nursing note reviewed.  Constitutional:      Appearance: Normal appearance. She is underweight. She is not ill-appearing.     Comments: Supplemental O2 in place  Abdominal:     General: Abdomen is flat. Bowel sounds are normal. There is no distension.     Palpations: Abdomen is soft. There is no mass.     Tenderness: There is no abdominal tenderness. There is no guarding or rebound.     Hernia: No hernia is present.  Neurological:     Mental Status: She is alert.  Psychiatric:        Mood and Affect: Mood normal.        Behavior: Behavior normal.       Results for orders placed or performed in visit on 06/25/30  Basic metabolic panel  Result Value Ref Range   Sodium 141 135 - 145 mEq/L   Potassium 3.8 3.5 - 5.1 mEq/L   Chloride 101 96 - 112 mEq/L   CO2 36 (H) 19 - 32 mEq/L   Glucose, Bld 72 70 - 99 mg/dL   BUN 6 6 - 23 mg/dL   Creatinine, Ser 0.49 0.40 - 1.20  mg/dL   GFR 101.63 >60.00 mL/min   Calcium 9.0 8.4 - 10.5 mg/dL   Assessment & Plan:  This visit occurred during the SARS-CoV-2 public health emergency.  Safety protocols were in place, including screening questions prior to the visit, additional usage of  staff PPE, and extensive cleaning of exam room while observing appropriate contact time as indicated for disinfecting solutions.   Problem List Items Addressed This Visit    Watery diarrhea - Primary    Of unclear cause. She will continue to hold linzess and miralax (in h/o CIC). Discussed may use imodium PRN.  Check labs, fecal elastase, and GI pathogen panel to further eval for other cause, r/o infection, pancreatic insufficiency, etc.  Encouraged ongoing efforts towards good hydration.  Drop omeprazole dose to 40mg  QOD.  She is on sertraline 150mg  daily - will need to consider SSRI induced microscopic colitis.       Relevant Orders   Comprehensive metabolic panel   TSH   CBC with Differential/Platelet   Gastrointestinal Pathogen Panel PCR   Pancreatic Elastase, Fecal       No orders of the defined types were placed in this encounter.  Orders Placed This Encounter  Procedures  . Comprehensive metabolic panel  . TSH  . CBC with Differential/Platelet  . Gastrointestinal Pathogen Panel PCR  . Pancreatic Elastase, Fecal    Patient instructions: Drop omeprazole dose to 40mg  every other day.  Labs today. Pass by lab for stool tests.  Hold miralax and linzess for now, may use imodium 1/2-1 tablet twice daily as needed for diarrhea. Stop using if any abdominal pain.  Follow up plan: Return if symptoms worsen or fail to improve.  Ria Bush, MD

## 2020-09-28 DIAGNOSIS — M9904 Segmental and somatic dysfunction of sacral region: Secondary | ICD-10-CM | POA: Diagnosis not present

## 2020-09-28 DIAGNOSIS — M5413 Radiculopathy, cervicothoracic region: Secondary | ICD-10-CM | POA: Diagnosis not present

## 2020-09-28 DIAGNOSIS — M6240 Contracture of muscle, unspecified site: Secondary | ICD-10-CM | POA: Diagnosis not present

## 2020-09-28 DIAGNOSIS — M9902 Segmental and somatic dysfunction of thoracic region: Secondary | ICD-10-CM | POA: Diagnosis not present

## 2020-09-28 DIAGNOSIS — M9903 Segmental and somatic dysfunction of lumbar region: Secondary | ICD-10-CM | POA: Diagnosis not present

## 2020-09-28 DIAGNOSIS — M9901 Segmental and somatic dysfunction of cervical region: Secondary | ICD-10-CM | POA: Diagnosis not present

## 2020-09-28 DIAGNOSIS — M9905 Segmental and somatic dysfunction of pelvic region: Secondary | ICD-10-CM | POA: Diagnosis not present

## 2020-09-29 ENCOUNTER — Telehealth: Payer: Self-pay | Admitting: Adult Health Nurse Practitioner

## 2020-09-29 ENCOUNTER — Other Ambulatory Visit: Payer: Self-pay | Admitting: Family Medicine

## 2020-09-29 DIAGNOSIS — J441 Chronic obstructive pulmonary disease with (acute) exacerbation: Secondary | ICD-10-CM

## 2020-09-29 NOTE — Telephone Encounter (Signed)
Patient returned my VM and confirmed appt for Monday at [facility]. Olena Heckle NP

## 2020-09-29 NOTE — Telephone Encounter (Signed)
Needing to reschedule Monday's visit.  Patient okay with changing appt from 11am to 9am Christine Serrano K. Olena Heckle NP

## 2020-09-29 NOTE — Telephone Encounter (Signed)
Called patient and left a voicemail asking her if we could change the time of the Palliative f/u visit on 10/02/20 from 11 AM to 9 AM.  Left my name and call back number.  I also called and left a message with Nigel Sloop (roommate) regarding above and requested a return call as soon as possible to let me know if we could change the time of the appointment.

## 2020-09-29 NOTE — Telephone Encounter (Signed)
Called to remind patient of appt on Monday at 11am.  Left VM with reason for call and call back info Ann Groeneveld K. Olena Heckle NP

## 2020-10-02 ENCOUNTER — Other Ambulatory Visit: Payer: Self-pay

## 2020-10-02 ENCOUNTER — Other Ambulatory Visit: Payer: Medicare Other

## 2020-10-02 ENCOUNTER — Other Ambulatory Visit: Payer: Medicare Other | Admitting: Adult Health Nurse Practitioner

## 2020-10-02 ENCOUNTER — Encounter: Payer: Self-pay | Admitting: Adult Health Nurse Practitioner

## 2020-10-02 VITALS — BP 98/50 | HR 116 | Wt 98.0 lb

## 2020-10-02 DIAGNOSIS — R197 Diarrhea, unspecified: Secondary | ICD-10-CM

## 2020-10-02 DIAGNOSIS — F339 Major depressive disorder, recurrent, unspecified: Secondary | ICD-10-CM

## 2020-10-02 DIAGNOSIS — J9611 Chronic respiratory failure with hypoxia: Secondary | ICD-10-CM | POA: Diagnosis not present

## 2020-10-02 DIAGNOSIS — Z515 Encounter for palliative care: Secondary | ICD-10-CM | POA: Diagnosis not present

## 2020-10-02 DIAGNOSIS — Z9981 Dependence on supplemental oxygen: Secondary | ICD-10-CM | POA: Diagnosis not present

## 2020-10-02 NOTE — Progress Notes (Signed)
[facility] Consult Note Telephone: [phone]  Fax: [phone]    Date of encounter: 10/02/20 PATIENT NAME: Christine Serrano [address] [place] [address]   [phone] (home)  DOB: [birth year] MRN: [id number] PRIMARY CARE PROVIDER:    Ria Bush, MD,  [place] [address] [phone]  REFERRING PROVIDER:   Ria Bush, MD [address] [place],  [place] [address] [phone]  RESPONSIBLE PARTY:    Contact Information    Name Relation Home Work Mobile   [place]   [phone]       I met face to face with patient and family in home. Palliative Care was asked to follow this patient by consultation request of  Ria Bush, MD to address advance care planning and complex medical decision making. This is a follow up visit.                                     ASSESSMENT AND PLAN / RECOMMENDATIONS:   Advance Care Planning/Goals of Care: Goals include to maximize quality of life and symptom management.   CODE STATUS: DNR  Symptom Management/Plan:  Diarrhea: Patient states that this has been improving.  Can continue Imodium as needed.  Continue follow-up and recommendations by PCP once stool test results come back.  COPD: Patient states breathing seems worse with new regimen.  Functional status appears to be at baseline.  She still has to take frequent rest breaks.  States having an appointment with pulmonologist at the end of this month.  Encouraged to discuss her breathing with the pulmonologist to see if any changes need to be made.  Depression: Patient states that she feels the depression is improving.  Has had a couple phone conversations with psychiatrist office.  Supposed to have virtual visit with psychiatrist on April 20.  States that she needs to get this set up and have encouraged her to make sure she has this set up as soon as the appointment is in  just a couple of days.  Continue follow-up and recommendations by psychiatry.  Follow up Palliative Care Visit: Palliative care will continue to follow for complex medical decision making, advance care planning, and clarification of goals. Return 6 weeks or prn. Encouraged to call with any questions or concerns.  I spent 50 minutes providing this consultation. More than 50% of the time in this consultation was spent in counseling and care coordination.   PPS: 40%  HOSPICE ELIGIBILITY/DIAGNOSIS: TBD  Chief Complaint: follow up palliative visit/depression  HISTORY OF PRESENT ILLNESS:  Christine Serrano is a 60 y.o. year old female  with severe COPD, HLD, depression. Patient has had episodes of watery diarrhea.  Denies foul odor, fever, blood in stool, N/V.  She has been seen by her PCP for this and her laxatives are being held and that she may use Imodium as needed.  Her lab work was overall unremarkable and they are waiting on results of stool tests.  Patient had recent appointment with pulmonologist and she is currently on breast tree inhaler and albuterol neb as needed.  States that she has been taking the albuterol nebulizer treatments about 3 times a day.  States that she feels like her breathing is worse since being taken off her other neb treatments and her Spiriva.  Patient states that her depression is getting better and  she has been engaging more in things that she enjoys such as sitting outside and seeing all the spring flowers in bloom.  States that her appetite is improving weight continues to be around 98 pounds.  States that she is trying to stay hydrated with the diarrhea.  Rest of 10 point ROS asked and negative except what is stated in HPI.  History obtained from review of EMR and interview with family and Christine Serrano.  I reviewed available labs, medications, and related documents from the EMR.  Records reviewed and summarized above.    PHYSICAL EXAM:  BP 98/50  HR 116  O2 98% on  4 L General: NAD, frail appearing, thin Eyes:  Sclera anicteric and noninjected with no discharge noted ENMT: moist mucous membranes Cardiovascular: regular rate and rhythm Pulmonary:rhonchi heard throughout that clears after coughing; patient does get SOB with talking today Abdomen: soft, nontender, + bowel sounds Extremities: no edema, no joint deformities Skin: no rashes on exposed skin Neurological: Weakness but otherwise nonfocal; having forgetfulness  Thank you for the opportunity to participate in the care of Christine Serrano.  The palliative care team will continue to follow. Please call our office at [phone] if we can be of additional assistance.   Christine Serrano Jenetta Downer, NP , DNP  COVID-19 PATIENT SCREENING TOOL Asked and negative response unless otherwise noted:   Have you had symptoms of covid, tested positive or been in contact with someone with symptoms/positive test in the past 5-10 days? Negative

## 2020-10-04 ENCOUNTER — Ambulatory Visit (INDEPENDENT_AMBULATORY_CARE_PROVIDER_SITE_OTHER): Payer: Medicare Other | Admitting: Psychologist

## 2020-10-04 DIAGNOSIS — F331 Major depressive disorder, recurrent, moderate: Secondary | ICD-10-CM

## 2020-10-06 DIAGNOSIS — J449 Chronic obstructive pulmonary disease, unspecified: Secondary | ICD-10-CM | POA: Diagnosis not present

## 2020-10-11 ENCOUNTER — Telehealth: Payer: Self-pay | Admitting: *Deleted

## 2020-10-11 LAB — GASTROINTESTINAL PATHOGEN PANEL PCR
C. difficile Tox A/B, PCR: NOT DETECTED
Campylobacter, PCR: NOT DETECTED
Cryptosporidium, PCR: NOT DETECTED
E coli (ETEC) LT/ST PCR: NOT DETECTED
E coli (STEC) stx1/stx2, PCR: NOT DETECTED
E coli 0157, PCR: NOT DETECTED
Giardia lamblia, PCR: NOT DETECTED
Norovirus, PCR: NOT DETECTED
Rotavirus A, PCR: NOT DETECTED
Salmonella, PCR: NOT DETECTED
Shigella, PCR: NOT DETECTED

## 2020-10-11 LAB — PANCREATIC ELASTASE, FECAL: Pancreatic Elastase-1, Stool: >500 mcg/g

## 2020-10-11 NOTE — Telephone Encounter (Signed)
Called patient to give her lab results and got her voicemail. Left a message for patient to call the office back.

## 2020-10-11 NOTE — Telephone Encounter (Signed)
Pt aware.  (see Labs Result Notes, 10/02/20)

## 2020-10-12 DIAGNOSIS — M6240 Contracture of muscle, unspecified site: Secondary | ICD-10-CM | POA: Diagnosis not present

## 2020-10-12 DIAGNOSIS — M9903 Segmental and somatic dysfunction of lumbar region: Secondary | ICD-10-CM | POA: Diagnosis not present

## 2020-10-12 DIAGNOSIS — M9905 Segmental and somatic dysfunction of pelvic region: Secondary | ICD-10-CM | POA: Diagnosis not present

## 2020-10-12 DIAGNOSIS — M9901 Segmental and somatic dysfunction of cervical region: Secondary | ICD-10-CM | POA: Diagnosis not present

## 2020-10-12 DIAGNOSIS — M5413 Radiculopathy, cervicothoracic region: Secondary | ICD-10-CM | POA: Diagnosis not present

## 2020-10-12 DIAGNOSIS — M9904 Segmental and somatic dysfunction of sacral region: Secondary | ICD-10-CM | POA: Diagnosis not present

## 2020-10-12 DIAGNOSIS — M9902 Segmental and somatic dysfunction of thoracic region: Secondary | ICD-10-CM | POA: Diagnosis not present

## 2020-10-17 ENCOUNTER — Emergency Department (HOSPITAL_COMMUNITY): Payer: Medicare Other

## 2020-10-17 ENCOUNTER — Emergency Department (HOSPITAL_COMMUNITY)
Admission: EM | Admit: 2020-10-17 | Discharge: 2020-10-18 | Disposition: A | Discharge status: Home / Self Care | Payer: Medicare Other | Attending: Emergency Medicine | Admitting: Emergency Medicine

## 2020-10-17 ENCOUNTER — Other Ambulatory Visit: Payer: Self-pay

## 2020-10-17 DIAGNOSIS — S060X1A Concussion with loss of consciousness of 30 minutes or less, initial encounter: Secondary | ICD-10-CM | POA: Diagnosis not present

## 2020-10-17 DIAGNOSIS — Z743 Need for continuous supervision: Secondary | ICD-10-CM | POA: Diagnosis not present

## 2020-10-17 DIAGNOSIS — R4020 Unspecified coma: Secondary | ICD-10-CM | POA: Diagnosis not present

## 2020-10-17 DIAGNOSIS — F17210 Nicotine dependence, cigarettes, uncomplicated: Secondary | ICD-10-CM | POA: Insufficient documentation

## 2020-10-17 DIAGNOSIS — J449 Chronic obstructive pulmonary disease, unspecified: Secondary | ICD-10-CM | POA: Diagnosis not present

## 2020-10-17 DIAGNOSIS — Y901 Blood alcohol level of 20-39 mg/100 ml: Secondary | ICD-10-CM | POA: Diagnosis not present

## 2020-10-17 DIAGNOSIS — Z7982 Long term (current) use of aspirin: Secondary | ICD-10-CM | POA: Insufficient documentation

## 2020-10-17 DIAGNOSIS — Z7951 Long term (current) use of inhaled steroids: Secondary | ICD-10-CM | POA: Insufficient documentation

## 2020-10-17 DIAGNOSIS — R400 Somnolence: Secondary | ICD-10-CM | POA: Diagnosis not present

## 2020-10-17 DIAGNOSIS — S0990XA Unspecified injury of head, initial encounter: Secondary | ICD-10-CM | POA: Diagnosis present

## 2020-10-17 DIAGNOSIS — Z85038 Personal history of other malignant neoplasm of large intestine: Secondary | ICD-10-CM | POA: Diagnosis not present

## 2020-10-17 DIAGNOSIS — Y92410 Unspecified street and highway as the place of occurrence of the external cause: Secondary | ICD-10-CM | POA: Diagnosis not present

## 2020-10-17 DIAGNOSIS — Z79899 Other long term (current) drug therapy: Secondary | ICD-10-CM | POA: Insufficient documentation

## 2020-10-17 DIAGNOSIS — S51011A Laceration without foreign body of right elbow, initial encounter: Secondary | ICD-10-CM | POA: Insufficient documentation

## 2020-10-17 DIAGNOSIS — W1781XA Fall down embankment (hill), initial encounter: Secondary | ICD-10-CM | POA: Insufficient documentation

## 2020-10-17 DIAGNOSIS — Z8541 Personal history of malignant neoplasm of cervix uteri: Secondary | ICD-10-CM | POA: Insufficient documentation

## 2020-10-17 DIAGNOSIS — R42 Dizziness and giddiness: Secondary | ICD-10-CM | POA: Diagnosis not present

## 2020-10-17 DIAGNOSIS — S0003XA Contusion of scalp, initial encounter: Secondary | ICD-10-CM | POA: Diagnosis not present

## 2020-10-17 DIAGNOSIS — I499 Cardiac arrhythmia, unspecified: Secondary | ICD-10-CM | POA: Diagnosis not present

## 2020-10-17 DIAGNOSIS — H579 Unspecified disorder of eye and adnexa: Secondary | ICD-10-CM | POA: Diagnosis not present

## 2020-10-17 DIAGNOSIS — R6889 Other general symptoms and signs: Secondary | ICD-10-CM | POA: Diagnosis not present

## 2020-10-17 LAB — CBC WITH DIFFERENTIAL/PLATELET
Abs Immature Granulocytes: 0.02 10*3/uL (ref 0.00–0.07)
Basophils Absolute: 0 10*3/uL (ref 0.0–0.1)
Basophils Relative: 0 %
Eosinophils Absolute: 0 10*3/uL (ref 0.0–0.5)
Eosinophils Relative: 1 %
HCT: 34.2 % — ABNORMAL LOW (ref 36.0–46.0)
Hemoglobin: 11.2 g/dL — ABNORMAL LOW (ref 12.0–15.0)
Immature Granulocytes: 0 %
Lymphocytes Relative: 29 %
Lymphs Abs: 2.2 10*3/uL (ref 0.7–4.0)
MCH: 27.5 pg (ref 26.0–34.0)
MCHC: 32.7 g/dL (ref 30.0–36.0)
MCV: 83.8 fL (ref 80.0–100.0)
Monocytes Absolute: 0.6 10*3/uL (ref 0.1–1.0)
Monocytes Relative: 8 %
Neutro Abs: 4.6 10*3/uL (ref 1.7–7.7)
Neutrophils Relative %: 62 %
Platelets: 205 10*3/uL (ref 150–400)
RBC: 4.08 MIL/uL (ref 3.87–5.11)
RDW: 14.4 % (ref 11.5–15.5)
WBC: 7.5 10*3/uL (ref 4.0–10.5)
nRBC: 0 % (ref 0.0–0.2)

## 2020-10-17 LAB — COMPREHENSIVE METABOLIC PANEL
ALT: 14 U/L (ref 0–44)
AST: 35 U/L (ref 15–41)
Albumin: 3 g/dL — ABNORMAL LOW (ref 3.5–5.0)
Alkaline Phosphatase: 57 U/L (ref 38–126)
Anion gap: 7 (ref 5–15)
BUN: 5 mg/dL — ABNORMAL LOW (ref 8–23)
CO2: 31 mmol/L (ref 22–32)
Calcium: 8.1 mg/dL — ABNORMAL LOW (ref 8.9–10.3)
Chloride: 98 mmol/L (ref 98–111)
Creatinine, Ser: 0.57 mg/dL (ref 0.44–1.00)
GFR, Estimated: >60 mL/min (ref >60)
Glucose, Bld: 92 mg/dL (ref 70–99)
Potassium: 4.5 mmol/L (ref 3.5–5.1)
Sodium: 136 mmol/L (ref 135–145)
Total Bilirubin: 0.8 mg/dL (ref 0.3–1.2)
Total Protein: 5.1 g/dL — ABNORMAL LOW (ref 6.5–8.1)

## 2020-10-17 LAB — PROTIME-INR
INR: 0.9 (ref 0.8–1.2)
Prothrombin Time: 12.2 seconds (ref 11.4–15.2)

## 2020-10-17 LAB — RAPID URINE DRUG SCREEN, HOSP PERFORMED
Amphetamines: NOT DETECTED
Barbiturates: NOT DETECTED
Benzodiazepines: NOT DETECTED
Cocaine: NOT DETECTED
Opiates: NOT DETECTED
Tetrahydrocannabinol: NOT DETECTED

## 2020-10-17 LAB — URINALYSIS, ROUTINE W REFLEX MICROSCOPIC
Bilirubin Urine: NEGATIVE
Glucose, UA: NEGATIVE mg/dL
Ketones, ur: NEGATIVE mg/dL
Nitrite: NEGATIVE
Protein, ur: NEGATIVE mg/dL
Specific Gravity, Urine: 1.002 — ABNORMAL LOW (ref 1.005–1.030)
pH: 6 (ref 5.0–8.0)

## 2020-10-17 LAB — ETHANOL: Alcohol, Ethyl (B): 26 mg/dL — ABNORMAL HIGH (ref <10)

## 2020-10-17 MED ORDER — ALBUTEROL SULFATE HFA 108 (90 BASE) MCG/ACT IN AERS
4.0000 | INHALATION_SPRAY | Freq: Once | RESPIRATORY_TRACT | Status: AC
  Administered 2020-10-17: 4 via RESPIRATORY_TRACT
  Filled 2020-10-17: qty 6.7

## 2020-10-17 MED ORDER — LIDOCAINE HCL (PF) 1 % IJ SOLN
5.0000 mL | Freq: Once | INTRAMUSCULAR | Status: DC
  Filled 2020-10-17: qty 5

## 2020-10-17 MED ORDER — ACETAMINOPHEN 500 MG PO TABS
1000.0000 mg | ORAL_TABLET | Freq: Once | ORAL | Status: AC
  Administered 2020-10-17: 1000 mg via ORAL
  Filled 2020-10-17: qty 2

## 2020-10-17 NOTE — Discharge Instructions (Signed)
1.  Follow instructions for head injury.  Return if you are developing a bad headache, confusion, vomiting or other concerning symptoms.  Review instructions for concussion.  You may experience some problems with memory loss around the event, difficulty concentrating. 2.  You may change your dressing on your wound in 2 days.  That time use a mild soapy water to clean the wound well rinse well and dry with a clean cloth.  Apply antibiotic ointment and redress it daily. 3.  Make an appointment to see your family doctor for recheck within 3 to 5 days.  Turn to the emergency department with any worsening or concerning symptoms
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
## 2020-10-18 ENCOUNTER — Telehealth: Payer: Self-pay

## 2020-10-18 ENCOUNTER — Ambulatory Visit: Payer: Medicare Other | Admitting: Family Medicine

## 2020-10-18 NOTE — Telephone Encounter (Signed)
Patient had fall yesterday was seen at ED. Needs to have follow up in 3-5 per ED for suture removal. Do not see anything open for time frame. Did you want to have worked in?

## 2020-10-19 ENCOUNTER — Other Ambulatory Visit: Payer: Self-pay | Admitting: Pulmonary Disease

## 2020-10-19 ENCOUNTER — Telehealth: Payer: Self-pay | Admitting: Family Medicine

## 2020-10-19 DIAGNOSIS — J449 Chronic obstructive pulmonary disease, unspecified: Secondary | ICD-10-CM | POA: Diagnosis not present

## 2020-10-19 NOTE — Telephone Encounter (Signed)
Upper extremity sutures usually can be removed after 7 days  [facility] place next Tuesday at 12:30pm.
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
## 2020-10-20 ENCOUNTER — Telehealth: Payer: Self-pay | Admitting: Adult Health Nurse Practitioner
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
## 2020-10-20 NOTE — Telephone Encounter (Addendum)
Returned pt's call.  Wants to make  aware she is going back to ER.  Thinks she may have R arm fx.   FYI, pt's ER f/u is Tues, 10/24/20.

## 2020-10-23 NOTE — Telephone Encounter (Signed)
Opened in error °Claudy Abdallah K. Finn Amos NP °

## 2020-10-24 ENCOUNTER — Ambulatory Visit (INDEPENDENT_AMBULATORY_CARE_PROVIDER_SITE_OTHER)
Admission: RE | Admit: 2020-10-24 | Discharge: 2020-10-24 | Disposition: A | Discharge status: Home / Self Care | Payer: Medicare Other | Source: Ambulatory Visit | Attending: Family Medicine | Admitting: Family Medicine

## 2020-10-24 ENCOUNTER — Ambulatory Visit (INDEPENDENT_AMBULATORY_CARE_PROVIDER_SITE_OTHER): Payer: Medicare Other | Admitting: Family Medicine

## 2020-10-24 ENCOUNTER — Encounter: Payer: Self-pay | Admitting: Family Medicine

## 2020-10-24 ENCOUNTER — Other Ambulatory Visit: Payer: Self-pay

## 2020-10-24 VITALS — BP 138/84 | HR 111 | Temp 97.2°F | Ht 64.0 in

## 2020-10-24 DIAGNOSIS — S4991XA Unspecified injury of right shoulder and upper arm, initial encounter: Secondary | ICD-10-CM | POA: Diagnosis not present

## 2020-10-24 DIAGNOSIS — S51011A Laceration without foreign body of right elbow, initial encounter: Secondary | ICD-10-CM | POA: Diagnosis not present

## 2020-10-24 DIAGNOSIS — S0003XA Contusion of scalp, initial encounter: Secondary | ICD-10-CM | POA: Diagnosis not present

## 2020-10-24 DIAGNOSIS — W19XXXA Unspecified fall, initial encounter: Secondary | ICD-10-CM

## 2020-10-24 DIAGNOSIS — M79601 Pain in right arm: Secondary | ICD-10-CM | POA: Diagnosis not present

## 2020-10-24 MED ORDER — ALBUTEROL SULFATE HFA 108 (90 BASE) MCG/ACT IN AERS: INHALATION_SPRAY | RESPIRATORY_TRACT | 6 refills | Status: DC

## 2020-10-24 MED ORDER — CEPHALEXIN 500 MG PO CAPS: 500.0000 mg | ORAL_CAPSULE | Freq: Four times a day (QID) | ORAL | 0 refills | Status: DC

## 2020-10-24 MED ORDER — ALBUTEROL SULFATE (2.5 MG/3ML) 0.083% IN NEBU: 2.5000 mg | INHALATION_SOLUTION | Freq: Four times a day (QID) | RESPIRATORY_TRACT | 6 refills | Status: DC | PRN
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
## 2020-10-24 NOTE — Assessment & Plan Note (Addendum)
Check films r/o forearm, wrist, elbow fracture. Supportive care reviewed.  Given erythema present to wrist with significant pain, will cover for developing cellulitis with keflex QID 5d course. Full dose given normal kidney function.

## 2020-10-24 NOTE — Patient Instructions (Addendum)
2 sutures removed from R elbow Xrays today to rule out fracture Start keflex antibiotic course sent to pharmacy  Let us know if not improving as expected.
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
## 2020-10-24 NOTE — Assessment & Plan Note (Signed)
2 sutures removed, pt tolerated well.  

## 2020-10-24 NOTE — Assessment & Plan Note (Signed)
Not on blood thinner. Discussed anticipated course of recovery.

## 2020-10-26 ENCOUNTER — Telehealth: Payer: Self-pay

## 2020-10-26 ENCOUNTER — Other Ambulatory Visit: Payer: Self-pay | Admitting: Family Medicine

## 2020-10-26 DIAGNOSIS — M810 Age-related osteoporosis without current pathological fracture: Secondary | ICD-10-CM

## 2020-10-26 NOTE — Telephone Encounter (Signed)
Lvm asking pt to call back.  Need to relay x-ray results.  Forarem x-ray: Plz notify arm xray returned reassuringly without fracture.

## 2020-10-27 NOTE — Telephone Encounter (Signed)
Lvm asking pt to call back.  Need to relay x-ray results.  Results mailed to patient.  Forarem x-ray: Plz notify arm xray returned reassuringly without fracture.

## 2020-10-30 ENCOUNTER — Telehealth: Payer: Self-pay | Admitting: Pulmonary Disease

## 2020-10-30 DIAGNOSIS — Z7409 Other reduced mobility: Secondary | ICD-10-CM | POA: Diagnosis not present

## 2020-10-30 DIAGNOSIS — J449 Chronic obstructive pulmonary disease, unspecified: Secondary | ICD-10-CM | POA: Diagnosis not present

## 2020-10-30 DIAGNOSIS — J9611 Chronic respiratory failure with hypoxia: Secondary | ICD-10-CM | POA: Diagnosis not present

## 2020-10-30 NOTE — Telephone Encounter (Signed)
Spoke with pt relaying Dr. G's message.  Pt verbalizes understanding and expresses her thanks.  

## 2020-10-30 NOTE — Telephone Encounter (Signed)
Called and spoke to patient, who is requesting home PT order. She stated that this was discussed at her last OV and she now feels that she is ready to proceed.  Dr. Patsey Berthold, please advise. Thanks.

## 2020-10-30 NOTE — Telephone Encounter (Addendum)
Hematoma can take several weeks to go away.

## 2020-10-30 NOTE — Telephone Encounter (Signed)
We discussed it and she was going to ask primary.  But is okay to order home PT though I am not sure she will be able to participate well due to her severe dyspnea.  May consider looking into [facility]PT

## 2020-10-30 NOTE — Telephone Encounter (Signed)
Pt returning call.  I relayed results and notified her they have been mailed.  Pt verbalizes understanding.  Also.  Pt is asking when the hematoma on her head will go down.  States it's still very large.  Plz advise.

## 2020-10-30 NOTE — Telephone Encounter (Signed)
Patient is aware of below message/recommedations and voiced her understanding.  She would like to try home PT. Order has been placed.  Nothing further needed at this time.

## 2020-10-31 ENCOUNTER — Ambulatory Visit: Payer: Medicare Other | Admitting: Psychologist

## 2020-10-31 ENCOUNTER — Other Ambulatory Visit: Payer: Self-pay | Admitting: Family Medicine

## 2020-10-31 DIAGNOSIS — J441 Chronic obstructive pulmonary disease with (acute) exacerbation: Secondary | ICD-10-CM

## 2020-11-01 DIAGNOSIS — J9611 Chronic respiratory failure with hypoxia: Secondary | ICD-10-CM | POA: Diagnosis not present

## 2020-11-01 DIAGNOSIS — Z7409 Other reduced mobility: Secondary | ICD-10-CM | POA: Diagnosis not present

## 2020-11-01 DIAGNOSIS — J449 Chronic obstructive pulmonary disease, unspecified: Secondary | ICD-10-CM | POA: Diagnosis not present

## 2020-11-05 DIAGNOSIS — J449 Chronic obstructive pulmonary disease, unspecified: Secondary | ICD-10-CM | POA: Diagnosis not present

## 2020-11-08 ENCOUNTER — Telehealth: Payer: Self-pay | Admitting: Pulmonary Disease

## 2020-11-08 NOTE — Telephone Encounter (Signed)
Spoke to patient, who is requesting update on home PT.  Order was placed to [facility] on 8/10/221. She has not been contacted regarding referral.  Rodena Piety, can you help with this? Thanks

## 2020-11-09 NOTE — Telephone Encounter (Signed)
I have spoke with Mrs. Baltimore and she is aware that I am still working on this order.  I have sent referral to [facility] and [facility] and neither of them can take her insurance right now.  I have 12 more that is are in network but it will depend on what their availabilty

## 2020-11-10 ENCOUNTER — Telehealth: Payer: Self-pay | Admitting: Adult Health Nurse Practitioner

## 2020-11-10 NOTE — Telephone Encounter (Signed)
Spoke with patient and reminded of appt on 11/14/20 @ 11am Marylan Glore K. Olena Heckle NP

## 2020-11-14 ENCOUNTER — Other Ambulatory Visit: Payer: Self-pay

## 2020-11-14 ENCOUNTER — Other Ambulatory Visit: Payer: Medicare Other | Admitting: Adult Health Nurse Practitioner

## 2020-11-15 ENCOUNTER — Other Ambulatory Visit: Payer: Self-pay

## 2020-11-15 ENCOUNTER — Encounter: Payer: Self-pay | Admitting: Internal Medicine

## 2020-11-15 ENCOUNTER — Other Ambulatory Visit: Payer: Self-pay | Admitting: Pulmonary Disease

## 2020-11-15 ENCOUNTER — Telehealth: Payer: Self-pay | Admitting: Adult Health Nurse Practitioner

## 2020-11-15 ENCOUNTER — Telehealth (INDEPENDENT_AMBULATORY_CARE_PROVIDER_SITE_OTHER): Payer: Medicare Other | Admitting: Internal Medicine

## 2020-11-15 DIAGNOSIS — J441 Chronic obstructive pulmonary disease with (acute) exacerbation: Secondary | ICD-10-CM | POA: Diagnosis not present

## 2020-11-15 MED ORDER — DOXYCYCLINE HYCLATE 100 MG PO TABS: 100.0000 mg | ORAL_TABLET | Freq: Two times a day (BID) | ORAL | 0 refills | Status: DC

## 2020-11-15 MED ORDER — PREDNISONE 20 MG PO TABS: 40.0000 mg | ORAL_TABLET | Freq: Every day | ORAL | 0 refills | Status: DC

## 2020-11-15 NOTE — Telephone Encounter (Signed)
This is late entry.  Spoke with patient yesterday.  She needed to reschedule appointment due to illness.  Rescheduled appointment for November 22, 2020 at 1pm Christine Serrano K. Olena Heckle NP

## 2020-11-15 NOTE — Progress Notes (Signed)
Subjective:    Patient ID: Christine Serrano, female    DOB: [birth year], 60 y.o.   MRN: [id number]  HPI Video virtual visit due to respiratory symptoms Identification done Reviewed limitations and billing and she gave consent Participants--patient in her home and I am in my office  Has a really stopped up head and ear pain (right) Coughing "like crazy"--thick yellow/brown sputum Having trouble breathing Hasn't been out of bed for 3-4 days Trouble eating--no appetite Some wheezing  Is on chronic prednisone--10mg  daily No fever Hasn't used any other medications No COVID immunizations---no known expsoure Lives with partner--she had negative COVID test last week On disability  Current Outpatient Medications on File Prior to Visit  Medication Sig Dispense Refill  . albuterol (VENTOLIN HFA) 108 (90 Base) MCG/ACT inhaler TAKE 2 PUFFS BY MOUTH EVERY 6 HOURS AS NEEDED FOR WHEEZE OR SHORTNESS OF BREATH 18 each 6  . Ascorbic Acid (VITAMIN C WITH ROSE HIPS) 500 MG tablet Take 500 mg by mouth daily.    Marland Kitchen aspirin EC 81 MG tablet Take 81 mg by mouth daily.    . benzonatate (TESSALON) 100 MG capsule Take 1 capsule (100 mg total) by mouth 3 (three) times daily as needed for cough. 30 capsule 11  . Budeson-Glycopyrrol-Formoterol (BREZTRI AEROSPHERE) 160-9-4.8 MCG/ACT AERO Inhale 2 puffs into the lungs in the morning and at bedtime. 10.7 g 11  . buPROPion (WELLBUTRIN XL) 150 MG 24 hr tablet Take 1 tablet (150 mg total) by mouth daily. 90 tablet 3  . cholecalciferol (VITAMIN D3) 25 MCG (1000 UNIT) tablet Take 1,000 Units by mouth daily.    . fluticasone (FLONASE) 50 MCG/ACT nasal spray Place 2 sprays into both nostrils daily. 48 g 3  . folic acid (FOLVITE) 1 MG tablet Take 1 tablet (1 mg total) by mouth daily. 90 tablet 3  . gabapentin (NEURONTIN) 100 MG capsule Take 1 capsule (100 mg total) by mouth 2 (two) times daily. 180 capsule 3  . hydrOXYzine (ATARAX/VISTARIL) 25 MG tablet Take 1 tablet (25  mg total) by mouth in the morning and at bedtime. 180 tablet 1  . LINZESS 290 MCG CAPS capsule TAKE 1 CAPSULE EVERY DAY AS NEEDED FOR CONSTIPATION AS DIRECTED 90 capsule 3  . meclizine (ANTIVERT) 25 MG tablet Take 1 tablet (25 mg total) by mouth 3 (three) times daily as needed for dizziness. 30 tablet 0  . methocarbamol (ROBAXIN) 500 MG tablet TAKE 1 TABLET BY MOUTH 3 TIMES DAILY AS NEEDED FOR MUSCLE SPASMS (SEDATION PRECAUTIONS). 90 tablet 1  . mirtazapine (REMERON) 15 MG tablet Take 1 tablet (15 mg total) by mouth at bedtime. 90 tablet 3  . Multiple Vitamin (MULTIVITAMIN WITH MINERALS) TABS tablet Take 1 tablet by mouth daily.    . naproxen (NAPROSYN) 375 MG tablet TAKE 1 TABLET BY MOUTH TWICE A DAY AS NEEDED FOR PAIN WITH FOOD 40 tablet 3  . omeprazole (PRILOSEC) 40 MG capsule Take 1 capsule (40 mg total) by mouth daily. 90 capsule 3  . OXYGEN Inhale 4 L/min into the lungs continuous.     . polyethylene glycol powder (GLYCOLAX/MIRALAX) 17 GM/SCOOP powder Take 17 g by mouth daily as needed for moderate constipation. 3350 g 1  . predniSONE (DELTASONE) 5 MG tablet Take 2 tablets daily with breakfast or as directed 60 tablet 3  . sertraline (ZOLOFT) 100 MG tablet Take 1.5 tablets (150 mg total) by mouth daily. 135 tablet 3  . thiamine 100 MG tablet Take 1 tablet (100  mg total) by mouth daily. 30 tablet 0  . vitamin B-12 (CYANOCOBALAMIN) 1000 MCG tablet Take 1 tablet (1,000 mcg total) by mouth daily.    Marland Kitchen albuterol (PROVENTIL) (2.5 MG/3ML) 0.083% nebulizer solution Take 3 mLs (2.5 mg total) by nebulization every 6 (six) hours as needed for wheezing or shortness of breath. (Patient not taking: Reported on 11/15/2020) 150 mL 6   No current facility-administered medications on file prior to visit.    Allergies  Allergen Reactions  . Terbinafine And Related Diarrhea    Diarrheal illness while on terbinafine ?related to med  . Clarithromycin     Caused Thrush   . Imitrex [Sumatriptan] Other (See  Comments)    Worsens migraine     Past Medical History:  Diagnosis Date  . Cervical cancer ([place])   . Chronic idiopathic constipation   . Community acquired pneumonia of right upper lobe of lung 12/10/2016  . COPD (chronic obstructive pulmonary disease) ([place])   . Depression   . Endometriosis   . HLD (hyperlipidemia)   . Smoker     Past Surgical History:  Procedure Laterality Date  . ABLATION ON ENDOMETRIOSIS    . APPENDECTOMY    . BREAST EXCISIONAL BIOPSY Left age 50   benign biopsy - chronic fatty deposit  . COLONOSCOPY WITH PROPOFOL N/A 08/04/2017   TAx3, angiodysplastic lesion, diverticulosis, rpt 5 yrs ([place])  . EXPLORATORY LAPAROTOMY  1992   endometriosis  . FOOT SURGERY Right    needle imbeded  . LASER ABLATION CONDYLOMA CERVICAL / VULVAR    . NASAL SEPTUM SURGERY Bilateral   . OVARIAN CYST SURGERY Bilateral 1980   remote  . SALPINGOOPHORECTOMY Right 1983   ectopic pregnancy  . TONSILLECTOMY      Family History  Problem Relation Age of Onset  . Heart disease Mother   . Hypertension Mother   . Hypertension Father   . COPD Father   . Hearing loss Maternal Grandmother   . Hypertension Maternal Grandmother   . Hearing loss Maternal Grandfather   . Hypertension Maternal Grandfather   . Colon cancer Paternal Grandmother     Social History   Socioeconomic History  . Marital status: Legally Separated    Spouse name: Not on file  . Number of children: 0  . Years of education: Not on file  . Highest education level: Not on file  Occupational History  . Occupation: disabled  Tobacco Use  . Smoking status: Current Every Day Smoker    Packs/day: 0.50    Years: 51.00    Pack years: 25.50    Types: Cigarettes  . Smokeless tobacco: Never Used  . Tobacco comment: 0.5PPD 09/12/20  Vaping Use  . Vaping Use: Never used  Substance and Sexual Activity  . Alcohol use: Yes    Comment: occasional  . Drug use: No  . Sexual activity: Never  Other Topics Concern   . 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 Difficulty of Paying Living Expenses: Not hard at all  Food Insecurity: No Food Insecurity  . Worried About Charity fundraiser in the Last Year: Never true  . Ran Out of Food in the Last Year: Never true  Transportation  Needs: No Transportation Needs  . Lack of Transportation (Medical): No  . Lack of Transportation (Non-Medical): No  Physical Activity: Inactive  . Days of Exercise per Week: 0 days  . Minutes of Exercise per Session: 0 min  Stress: No Stress Concern Present  . Feeling of Stress : Not at all  Social Connections: Not on file  Intimate Partner Violence: Not At Risk  . Fear of Current or Ex-Partner: No  . Emotionally Abused: No  . Physically Abused: No  . Sexually Abused: No   Review of Systems No headache No clear loss of taste. Smell is decreased due to nasal congestion Is able to drink okay    Objective:   Physical Exam Constitutional:      Appearance: Normal appearance.  Pulmonary:     Effort: Pulmonary effort is normal. No respiratory distress.  Neurological:     Mental Status: She is alert.            Assessment & Plan:

## 2020-11-15 NOTE — Assessment & Plan Note (Signed)
Clear COPD exacerbation but not really distressed at this point. The purulent sputum could indicate bacterial infection--but unclear Possible COVID---but already 4 days of symptoms, so not worth testing since she is outside the window for oral antivirals. Advised ER evaluation if worsens, to decide if more aggressive Rx indicated  Will give 10 burst of higher prednisone and treat with doxycycline Will send note to Dr Patsey Berthold in case she thinks further intervention indicated now

## 2020-11-18 ENCOUNTER — Other Ambulatory Visit: Payer: Self-pay | Admitting: Family Medicine

## 2020-11-18 ENCOUNTER — Other Ambulatory Visit: Payer: Self-pay | Admitting: Pulmonary Disease

## 2020-11-18 ENCOUNTER — Other Ambulatory Visit: Payer: Self-pay | Admitting: Internal Medicine

## 2020-11-18 DIAGNOSIS — J441 Chronic obstructive pulmonary disease with (acute) exacerbation: Secondary | ICD-10-CM

## 2020-11-19 DIAGNOSIS — J449 Chronic obstructive pulmonary disease, unspecified: Secondary | ICD-10-CM | POA: Diagnosis not present

## 2020-11-20 NOTE — Telephone Encounter (Signed)
Christine Serrano from [facility] sent me a message stating that they can take the patient now if we don't have her already setup with someone else.  I responded with a please do and thank you.  I have left a message for the patient that someone from [facility] should be reaching out to her about doing her Home PT

## 2020-11-21 DIAGNOSIS — J441 Chronic obstructive pulmonary disease with (acute) exacerbation: Secondary | ICD-10-CM | POA: Diagnosis not present

## 2020-11-21 DIAGNOSIS — F17210 Nicotine dependence, cigarettes, uncomplicated: Secondary | ICD-10-CM | POA: Diagnosis not present

## 2020-11-21 DIAGNOSIS — Z9981 Dependence on supplemental oxygen: Secondary | ICD-10-CM | POA: Diagnosis not present

## 2020-11-21 DIAGNOSIS — J9611 Chronic respiratory failure with hypoxia: Secondary | ICD-10-CM | POA: Diagnosis not present

## 2020-11-21 DIAGNOSIS — M6281 Muscle weakness (generalized): Secondary | ICD-10-CM | POA: Diagnosis not present

## 2020-11-22 ENCOUNTER — Other Ambulatory Visit: Payer: Self-pay

## 2020-11-22 ENCOUNTER — Ambulatory Visit (INDEPENDENT_AMBULATORY_CARE_PROVIDER_SITE_OTHER): Payer: Medicare Other | Admitting: Pulmonary Disease

## 2020-11-22 ENCOUNTER — Other Ambulatory Visit: Payer: Medicare Other | Admitting: Adult Health Nurse Practitioner

## 2020-11-22 ENCOUNTER — Encounter: Payer: Self-pay | Admitting: Adult Health Nurse Practitioner

## 2020-11-22 ENCOUNTER — Encounter: Payer: Self-pay | Admitting: Pulmonary Disease

## 2020-11-22 VITALS — BP 134/76 | HR 70 | Temp 97.1°F | Ht 64.0 in | Wt 94.0 lb

## 2020-11-22 VITALS — BP 108/78 | HR 72 | Wt 94.8 lb

## 2020-11-22 DIAGNOSIS — F339 Major depressive disorder, recurrent, unspecified: Secondary | ICD-10-CM

## 2020-11-22 DIAGNOSIS — R64 Cachexia: Secondary | ICD-10-CM | POA: Diagnosis not present

## 2020-11-22 DIAGNOSIS — Z515 Encounter for palliative care: Secondary | ICD-10-CM

## 2020-11-22 DIAGNOSIS — Z9981 Dependence on supplemental oxygen: Secondary | ICD-10-CM

## 2020-11-22 DIAGNOSIS — J441 Chronic obstructive pulmonary disease with (acute) exacerbation: Secondary | ICD-10-CM | POA: Diagnosis not present

## 2020-11-22 DIAGNOSIS — J9611 Chronic respiratory failure with hypoxia: Secondary | ICD-10-CM | POA: Diagnosis not present

## 2020-11-22 DIAGNOSIS — M6281 Muscle weakness (generalized): Secondary | ICD-10-CM | POA: Diagnosis not present

## 2020-11-22 DIAGNOSIS — J449 Chronic obstructive pulmonary disease, unspecified: Secondary | ICD-10-CM

## 2020-11-22 DIAGNOSIS — F17210 Nicotine dependence, cigarettes, uncomplicated: Secondary | ICD-10-CM | POA: Diagnosis not present

## 2020-11-22 NOTE — Progress Notes (Signed)
Therapist, nutritional Palliative Care Consult Note Telephone: [phone]  Fax: [phone]    Date of encounter: 11/22/20 PATIENT NAME: Christine Serrano [address] [place] [address]   [phone] (home)  DOB: Apr 07, 1961 MRN: [id number] PRIMARY CARE PROVIDER:    Eustaquio Boyden, MD,  [address] [place] [address] [phone]  REFERRING PROVIDER:   Eustaquio Boyden, MD [address] [place],  [place] [address] [phone]  RESPONSIBLE PARTY:    Contact Information    Name Relation Home Work Mobile   [place]   [phone]       I met face to face with patient and family in home. Palliative Care was asked to follow this patient by consultation request of  Eustaquio Boyden, MD to address advance care planning and complex medical decision making. This is a follow up visit.  Significant other present during visit today                                   ASSESSMENT AND PLAN / RECOMMENDATIONS:   Advance Care Planning/Goals of Care: Goals include to maximize quality of life and symptom management.   CODE STATUS: DNR  Symptom Management/Plan:  COPD: Feeling better after treatment for COPD exacerbation.  Continue follow up and recommendations by pulmonology  Depression: Patient is wanting to continue with psych services but is having troubles getting set up with a psych provider.  Will put in SW referral to help find psych services.  Cachexia:  Continues to lose weight.  Encouraged to reach out to [facility].  States starting to supplement with Ensure and goal is to get 3 Ensures a day.  Continue to monitor weight.  Follow up Palliative Care Visit: Palliative care will continue to follow for complex medical decision making, advance care planning, and clarification of goals. Return 6 weeks or prn. Encouraged to call with any questions or concerns  I spent 60 minutes providing this consultation. More than 50% of the  time in this consultation was spent in counseling and care coordination.   PPS: 40%  HOSPICE ELIGIBILITY/DIAGNOSIS: TBD  Chief Complaint: follow up palliative visit  HISTORY OF PRESENT ILLNESS:  Christine Serrano is a 60 y.o. year old female  with severe COPD, HLD, depression. Patient had ED visit on 10/17/20 for concussion after fall with hit to head.  She did have laceration requiring suturing to right elbow.  She did have continued pain and swelling in right elbow and xray did not show any fracture.  She states that she still has small bump to back of head but denies headaches, dizziness, changes in vision, N/V. She has recently been treated for COPD exacerbation.  She states feeling better and that her breathing and cough have improved.  Her appetite is still poor and weight continues to drop. Current weight is 94.8.  She states that she has been given contact info for [facility] and she states that she is going to contact them.  She still gets very SOB with any activity and having [facility] will help conserve her energy.  She is still having problems with depression but today is determined to not give up.  States that the psych services she was trying to set up did not work as she was not able to connect virtually with them.  Rest of 10 point ROS asked and negative except what is stated in  HPI  History obtained from review of EMR and interview with family and Christine Serrano.  I reviewed available labs, medications, imaging, studies and related documents from the EMR.  Records reviewed and summarized above.    PHYSICAL EXAM:  General: NAD, frail appearing, thin Eyes: Sclera anicteric and noninjected with no discharge noted ENMT: moist mucous membranes Cardiovascular: regular rate and rhythm Pulmonary:some wheezing heard throughout; normal respiratory effort Abdomen: soft, nontender, + bowel sounds Extremities: no edema, no joint deformities Skin: no rasheson exposed skin Neurological: Weakness but  otherwise nonfocal; having forgetfulness  Thank you for the opportunity to participate in the care of Christine Serrano.  The palliative care team will continue to follow. Please call our office at [phone] if we can be of additional assistance.   Boaz Berisha Jenetta Downer, NP , DNP  This chart was dictated using voice recognition software. Despite best efforts to proofread, errors can occur which can change the documentation meaning.   COVID-19 PATIENT SCREENING TOOL Asked and negative response unless otherwise noted:   Have you had symptoms of covid, tested positive or been in contact with someone with symptoms/positive test in the past 5-10 days?

## 2020-11-22 NOTE — Progress Notes (Signed)
Subjective:    Patient ID: Christine Serrano, female    DOB: 11-19-1960, 60 y.o.   MRN: [id number] Chief Complaint  Patient presents with   Follow-up    C/o sob with exertion, prod cough with brown sputum and wheezing.    PT PROFILE: 60 year old F recalcitrant smoker with severe, oxygen-dependent COPD hospitalized multiple times for COPD exacerbation initially requiring NIPPV in 2017, former patient of Dr. Alva Garnet.   PROBLEMS: Recalcitrant smoker Chronic hypoxic respiratory failure Very severe COPD/emphysema Chronic bronchitis   DATA: CT chest 03/13/16: Severe emphysema with extensive disease in the upper lobes and superior segment of the right lower lobe. This could represent acute and/or chronic changes. In addition, there is an irregular spiculated density along the right minor fissure which is indeterminate. PFTs 01/15/18: FVC: 1.42 L (48 %pred), FEV1: 0.76 L (31 %pred), FEV1/FVC: 53% , TLC: 4.49 L (94 %pred), DLCO 30 %pred, DLCO/VA 51%. TLC likely underestimated by gas dilution technique. Severe Gas trapping with RV at 172% predicted. CT chest 10/13/2019: Severe emphysema particularly upper lobes with upper lobe predominant scarring and architectural distortion no significant change from November 25, 2016 CT.  Coronary artery calcifications and aortic calcification was noted. Chest x-ray 10/16/2019: Hyperinflated lungs with diffuse interstitial prominence, no acute infiltrate. CT chest 06/07/2020: Severe emphysema, predominantly upper lobes.  Upper lobe predominant scarring and architectural distortion, new right middle lobe atelectasis. PET/CT 06/30/2020: No evidence of malignancy, previously noted right middle lobe atelectasis resolved, likely inflammatory/infectious.   INTERVAL:  Last seen 09/12/2020 by me.  Very end-stage COPD, her only complaint today is persistent shortness of breath though she feels that Judithann Sauger helps her.  Prednisone has helped her with appetite.  Her main issue is  that she does not have stamina to cook by herself.   HPI Christine Serrano is a 60 year old current smoker (half PPD) with very severe/end-stage COPD and chronic respiratory failure secondary to the same.  She is on oxygen 24/7 at 2 L/min.  She has difficulties with cooking by herself because of dyspnea and fatigue.  Her roommate cannot help with this.  We discussed contacting [employer] Whe[employer] to see if she can be help with at least getting a couple of meals prepared several times a week.  She feels that Judithann Sauger helps her.  She unfortunately continues to smoke.  She does not endorse any new complaint today.  She looks very weak and debilitated, nephric and pulmonary cachexia.  She is currently undergoing evaluation by palliative care.  Review of Systems A 10 point review of systems was performed and it is as noted above otherwise negative.  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 ([place]) 11/22/2020   Elbow laceration, right, initial encounter 10/24/2020   Arm injury, right, initial encounter 10/24/2020   Fall with injury 10/24/2020   Traumatic hematoma of scalp 10/24/2020   MRSA infection 08/23/2020   Dyspnea on exertion 08/23/2020   Abscess of axilla, left 08/14/2020   Low serum vitamin B12 06/09/2020   Cervical neck pain with evidence of disc disease 01/29/2020   Rhomboid muscle strain, subsequent encounter 11/10/2019   Palliative care status 10/23/2019   Impaired mobility 10/20/2019   Chronic respiratory failure with hypoxia, on home O2 therapy ([facility]) 10/20/2019   Memory difficulties 10/16/2019   Folate deficiency 09/17/2019   Dizziness 09/11/2019   Anemia 09/11/2019   Hypocalcemia 78/29/5621   Systolic murmur 30/86/5784   Vertebral artery stenosis, left 02/27/2019  Medicare annual wellness visit, subsequent 02/25/2019   Advanced directives, counseling/discussion 02/25/2019   Epistaxis 09/21/2018   Osteoporosis 05/23/2018   Watery diarrhea 05/08/2018    Numbness of right hand 10/10/2017   Benign neoplasm of ascending colon    Benign neoplasm of transverse colon    Benign neoplasm of sigmoid colon    Positive hepatitis C antibody test 02/23/2017   Encounter for general adult medical examination with abnormal findings 02/21/2017   HLD (hyperlipidemia) 02/21/2017   Anxiety 12/10/2016   Tobacco abuse 11/28/2016   Aortic atherosclerosis ([place] [facility]) 11/26/2016   Tachycardia 11/01/2016   Pulmonary nodule 10/25/2016   Moderate recurrent major depression ([facility]) 07/03/2016   Chronic constipation 07/03/2016   Alcohol abuse, in remission 07/03/2016   COPD, very severe ([place]) 07/03/2016   Tremor 07/03/2016   Chronic insomnia 07/03/2016   Social History   Tobacco Use   Smoking status: Current Every Day Smoker    Packs/day: 3.00    Years: 51.00    Pack years: 153.00    Types: Cigarettes   Smokeless tobacco: Never Used   Tobacco comment: 1PPD 11/22/2020  Substance Use Topics   Alcohol use: Yes    Comment: occasional   Allergies  Allergen Reactions   Terbinafine And Related Diarrhea    Diarrheal illness while on terbinafine ?related to med   Clarithromycin     Caused Thrush    Imitrex [Sumatriptan] Other (See Comments)    Worsens migraine    Current Meds  Medication Sig   albuterol (PROVENTIL) (2.5 MG/3ML) 0.083% nebulizer solution Take 3 mLs (2.5 mg total) by nebulization every 6 (six) hours as needed for wheezing or shortness of breath.   albuterol (VENTOLIN HFA) 108 (90 Base) MCG/ACT inhaler TAKE 2 PUFFS BY MOUTH EVERY 6 HOURS AS NEEDED FOR WHEEZE OR SHORTNESS OF BREATH   Ascorbic Acid (VITAMIN C WITH ROSE HIPS) 500 MG tablet Take 500 mg by mouth daily.   aspirin EC 81 MG tablet Take 81 mg by mouth daily.   benzonatate (TESSALON) 100 MG capsule Take 1 capsule (100 mg total) by mouth 3 (three) times daily as needed for cough.   Budeson-Glycopyrrol-Formoterol (BREZTRI AEROSPHERE) 160-9-4.8 MCG/ACT AERO Inhale 2 puffs into the lungs in  the morning and at bedtime.   buPROPion (WELLBUTRIN XL) 150 MG 24 hr tablet Take 1 tablet (150 mg total) by mouth daily.   cholecalciferol (VITAMIN D3) 25 MCG (1000 UNIT) tablet Take 1,000 Units by mouth daily.   doxycycline (VIBRA-TABS) 100 MG tablet Take 1 tablet (100 mg total) by mouth 2 (two) times daily.   fluticasone (FLONASE) 50 MCG/ACT nasal spray Place 2 sprays into both nostrils daily.   folic acid (FOLVITE) 1 MG tablet Take 1 tablet (1 mg total) by mouth daily.   gabapentin (NEURONTIN) 100 MG capsule Take 1 capsule (100 mg total) by mouth 2 (two) times daily.   hydrOXYzine (ATARAX/VISTARIL) 25 MG tablet Take 1 tablet (25 mg total) by mouth in the morning and at bedtime.   LINZESS 290 MCG CAPS capsule TAKE 1 CAPSULE EVERY DAY AS NEEDED FOR CONSTIPATION AS DIRECTED   meclizine (ANTIVERT) 25 MG tablet Take 1 tablet (25 mg total) by mouth 3 (three) times daily as needed for dizziness.   methocarbamol (ROBAXIN) 500 MG tablet TAKE 1 TABLET BY MOUTH 3 TIMES DAILY AS NEEDED FOR MUSCLE SPASMS (SEDATION PRECAUTIONS).   mirtazapine (REMERON) 15 MG tablet Take 1 tablet (15 mg total) by mouth at bedtime.   Multiple Vitamin (MULTIVITAMIN WITH  MINERALS) TABS tablet Take 1 tablet by mouth daily.   naproxen (NAPROSYN) 375 MG tablet TAKE 1 TABLET BY MOUTH TWICE A DAY AS NEEDED FOR PAIN WITH FOOD   omeprazole (PRILOSEC) 40 MG capsule Take 1 capsule (40 mg total) by mouth daily.   OXYGEN Inhale 4 L/min into the lungs continuous.    polyethylene glycol powder (GLYCOLAX/MIRALAX) 17 GM/SCOOP powder Take 17 g by mouth daily as needed for moderate constipation.   predniSONE (DELTASONE) 20 MG tablet Take 2 tablets (40 mg total) by mouth daily. For 5 days, then 1 tab daily for 5 days.   predniSONE (DELTASONE) 5 MG tablet Take 2 tablets daily with breakfast or as directed   sertraline (ZOLOFT) 100 MG tablet Take 1.5 tablets (150 mg total) by mouth daily.   thiamine 100 MG tablet Take 1 tablet (100 mg total) by  mouth daily.   vitamin B-12 (CYANOCOBALAMIN) 1000 MCG tablet Take 1 tablet (1,000 mcg total) by mouth daily.   Immunization History  Administered Date(s) Administered   Influenza Split 04/17/2016   Influenza,inj,Quad PF,6+ Mos 02/21/2017, 03/17/2018, 02/25/2019, 03/13/2020   Pneumococcal Conjugate-13 05/11/2018   Pneumococcal Polysaccharide-23 06/17/2008   Zoster Recombinat (Shingrix) 08/30/2020       Objective:   Physical Exam BP 134/76 (BP Location: Left Arm, Cuff Size: Normal)   Pulse 70   Temp (!) 97.1 F (36.2 C) (Temporal)   Ht 5\' 4"  (1.626 m)   Wt 94 lb (42.6 kg)   LMP  (LMP Unknown)   SpO2 92%   BMI 16.14 kg/m  GENERAL: Thin/cachectic woman, debilitated, looks older than stated age, presents in transport chair. HEAD: Normocephalic, atraumatic.  EYES: Pupils equal, round, reactive to light.  No scleral icterus.  MOUTH: Nose/mouth/throat not examined due to masking requirements for COVID 19. NECK: Supple. No thyromegaly. Trachea midline. No JVD.  No adenopathy. PULMONARY: Distant breath sounds.  Rhonchi, occasional end expiratory wheeze. CARDIOVASCULAR: S1 and S2. Regular rate and rhythm.  No rubs, murmurs or gallops heard. ABDOMEN: Scaphoid, otherwise benign.   MUSCULOSKELETAL: No joint deformity, no clubbing, no edema.  NEUROLOGIC: No overt focal deficit.  Speech is fluent.  Gait not observed.   SKIN: Intact,warm,dry.  On limited exam no rashes. PSYCH: Mood and behavior normal.       Assessment & Plan:     ICD-10-CM   1. COPD, very severe ([place])  J44.9    Continue Breztri 2 puffs twice a day Continue as needed albuterol via nebulizer or MDI Stop smoking    2. Chronic respiratory failure with hypoxia, on home O2 therapy ([facility])  J96.11    Z99.81    Continue oxygen at current liter flow She notes improvement in symptoms with the oxygen He is compliant    3. 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 Tobacco dependence due to cigarettes  F17.210    Counseled regards to discontinuation of smoking     We will see the patient in follow-up in 1's time she is to contact us prior to that time should any new difficulties arise.

## 2020-11-22 NOTE — Patient Instructions (Signed)
Continue using Breztri  Continue prednisone  Continue your oxygen therapy  I recommend that you check [employer] on Wheels to see if you qualify for meal delivery  We will see him in follow-up in 3 months time call sooner should any new problems arise

## 2020-11-24 ENCOUNTER — Telehealth: Payer: Self-pay

## 2020-11-24 NOTE — Telephone Encounter (Signed)
11/24/20 @1 :15 PM: Pallaitve care SW outreached patient, per PC NP - A. Le Roy request, to address psych needs.  Patient shared that she was receiving virtual support/session for her depression, however she was having difficulty logging into the virtual platform an d the provider discontinued services.   SW offered to make referral through [facility] mental health platform for patient. Patient agreed. SW also provided patient with contact to [facility]  [phone].  Patient has no other concerns/needs at this time.

## 2020-12-02 DIAGNOSIS — J9611 Chronic respiratory failure with hypoxia: Secondary | ICD-10-CM | POA: Diagnosis not present

## 2020-12-02 DIAGNOSIS — J449 Chronic obstructive pulmonary disease, unspecified: Secondary | ICD-10-CM | POA: Diagnosis not present

## 2020-12-02 DIAGNOSIS — Z7409 Other reduced mobility: Secondary | ICD-10-CM | POA: Diagnosis not present

## 2020-12-06 DIAGNOSIS — M6281 Muscle weakness (generalized): Secondary | ICD-10-CM | POA: Diagnosis not present

## 2020-12-06 DIAGNOSIS — J441 Chronic obstructive pulmonary disease with (acute) exacerbation: Secondary | ICD-10-CM | POA: Diagnosis not present

## 2020-12-06 DIAGNOSIS — J9611 Chronic respiratory failure with hypoxia: Secondary | ICD-10-CM | POA: Diagnosis not present

## 2020-12-06 DIAGNOSIS — F17210 Nicotine dependence, cigarettes, uncomplicated: Secondary | ICD-10-CM | POA: Diagnosis not present

## 2020-12-06 DIAGNOSIS — Z9981 Dependence on supplemental oxygen: Secondary | ICD-10-CM | POA: Diagnosis not present

## 2020-12-06 DIAGNOSIS — J449 Chronic obstructive pulmonary disease, unspecified: Secondary | ICD-10-CM | POA: Diagnosis not present

## 2020-12-12 DIAGNOSIS — F17210 Nicotine dependence, cigarettes, uncomplicated: Secondary | ICD-10-CM | POA: Diagnosis not present

## 2020-12-12 DIAGNOSIS — Z9981 Dependence on supplemental oxygen: Secondary | ICD-10-CM | POA: Diagnosis not present

## 2020-12-12 DIAGNOSIS — J441 Chronic obstructive pulmonary disease with (acute) exacerbation: Secondary | ICD-10-CM | POA: Diagnosis not present

## 2020-12-12 DIAGNOSIS — J9611 Chronic respiratory failure with hypoxia: Secondary | ICD-10-CM | POA: Diagnosis not present

## 2020-12-12 DIAGNOSIS — M6281 Muscle weakness (generalized): Secondary | ICD-10-CM | POA: Diagnosis not present

## 2020-12-13 ENCOUNTER — Other Ambulatory Visit: Payer: Self-pay

## 2020-12-13 MED ORDER — BREZTRI AEROSPHERE 160-9-4.8 MCG/ACT IN AERO: 2.0000 | INHALATION_SPRAY | Freq: Two times a day (BID) | RESPIRATORY_TRACT | 3 refills | Status: DC

## 2020-12-13 NOTE — Telephone Encounter (Signed)
Rx for Christine Serrano has been sent to preferred pharmacy.  Nothing further needed.

## 2020-12-13 NOTE — Telephone Encounter (Signed)
Meclizine last rx:  06/06/20, #30 Naproxen last rx:  06/06/20, #40 Last OV:  10/24/20, R arm injury Next OV:  05/29/21, AWV prt 2

## 2020-12-15 MED ORDER — MECLIZINE HCL 25 MG PO TABS: 25.0000 mg | ORAL_TABLET | Freq: Three times a day (TID) | ORAL | 0 refills | Status: DC | PRN

## 2020-12-15 MED ORDER — NAPROXEN 375 MG PO TABS: ORAL_TABLET | ORAL | 3 refills | Status: DC

## 2020-12-15 NOTE — Telephone Encounter (Signed)
ERx 

## 2020-12-18 ENCOUNTER — Other Ambulatory Visit: Payer: Self-pay | Admitting: Family Medicine

## 2020-12-19 ENCOUNTER — Other Ambulatory Visit: Payer: Self-pay

## 2020-12-19 DIAGNOSIS — F17210 Nicotine dependence, cigarettes, uncomplicated: Secondary | ICD-10-CM | POA: Diagnosis not present

## 2020-12-19 DIAGNOSIS — J449 Chronic obstructive pulmonary disease, unspecified: Secondary | ICD-10-CM | POA: Diagnosis not present

## 2020-12-19 DIAGNOSIS — Z9981 Dependence on supplemental oxygen: Secondary | ICD-10-CM | POA: Diagnosis not present

## 2020-12-19 DIAGNOSIS — J9611 Chronic respiratory failure with hypoxia: Secondary | ICD-10-CM | POA: Diagnosis not present

## 2020-12-19 DIAGNOSIS — J441 Chronic obstructive pulmonary disease with (acute) exacerbation: Secondary | ICD-10-CM | POA: Diagnosis not present

## 2020-12-19 DIAGNOSIS — M6281 Muscle weakness (generalized): Secondary | ICD-10-CM | POA: Diagnosis not present

## 2020-12-19 NOTE — Telephone Encounter (Signed)
Robaxin Last rx:  06/06/20, #90 Last OV:  10/24/20, R arm injury Next OV:  05/29/21, AWV prt 2

## 2020-12-20 MED ORDER — METHOCARBAMOL 500 MG PO TABS: ORAL_TABLET | ORAL | 0 refills | Status: DC

## 2020-12-20 NOTE — Telephone Encounter (Signed)
ERx 

## 2020-12-25 DIAGNOSIS — J441 Chronic obstructive pulmonary disease with (acute) exacerbation: Secondary | ICD-10-CM | POA: Diagnosis not present

## 2020-12-25 DIAGNOSIS — M6281 Muscle weakness (generalized): Secondary | ICD-10-CM | POA: Diagnosis not present

## 2020-12-25 DIAGNOSIS — J9611 Chronic respiratory failure with hypoxia: Secondary | ICD-10-CM | POA: Diagnosis not present

## 2020-12-25 DIAGNOSIS — F17210 Nicotine dependence, cigarettes, uncomplicated: Secondary | ICD-10-CM | POA: Diagnosis not present

## 2020-12-25 DIAGNOSIS — Z9981 Dependence on supplemental oxygen: Secondary | ICD-10-CM | POA: Diagnosis not present

## 2020-12-26 ENCOUNTER — Other Ambulatory Visit: Payer: Self-pay | Admitting: Pulmonary Disease

## 2020-12-26 ENCOUNTER — Other Ambulatory Visit: Payer: Self-pay | Admitting: Family Medicine

## 2020-12-26 DIAGNOSIS — F331 Major depressive disorder, recurrent, moderate: Secondary | ICD-10-CM

## 2020-12-26 DIAGNOSIS — F419 Anxiety disorder, unspecified: Secondary | ICD-10-CM

## 2020-12-28 DIAGNOSIS — J441 Chronic obstructive pulmonary disease with (acute) exacerbation: Secondary | ICD-10-CM | POA: Diagnosis not present

## 2020-12-28 DIAGNOSIS — F17210 Nicotine dependence, cigarettes, uncomplicated: Secondary | ICD-10-CM | POA: Diagnosis not present

## 2020-12-28 DIAGNOSIS — J9611 Chronic respiratory failure with hypoxia: Secondary | ICD-10-CM | POA: Diagnosis not present

## 2020-12-28 DIAGNOSIS — M6281 Muscle weakness (generalized): Secondary | ICD-10-CM | POA: Diagnosis not present

## 2020-12-28 DIAGNOSIS — Z9981 Dependence on supplemental oxygen: Secondary | ICD-10-CM | POA: Diagnosis not present

## 2021-01-01 DIAGNOSIS — Z7409 Other reduced mobility: Secondary | ICD-10-CM | POA: Diagnosis not present

## 2021-01-01 DIAGNOSIS — J9611 Chronic respiratory failure with hypoxia: Secondary | ICD-10-CM | POA: Diagnosis not present

## 2021-01-01 DIAGNOSIS — J449 Chronic obstructive pulmonary disease, unspecified: Secondary | ICD-10-CM | POA: Diagnosis not present

## 2021-01-02 ENCOUNTER — Other Ambulatory Visit: Payer: Self-pay | Admitting: Family Medicine

## 2021-01-02 ENCOUNTER — Other Ambulatory Visit: Payer: Self-pay | Admitting: Pulmonary Disease

## 2021-01-02 DIAGNOSIS — Z9981 Dependence on supplemental oxygen: Secondary | ICD-10-CM | POA: Diagnosis not present

## 2021-01-02 DIAGNOSIS — J9611 Chronic respiratory failure with hypoxia: Secondary | ICD-10-CM | POA: Diagnosis not present

## 2021-01-02 DIAGNOSIS — J441 Chronic obstructive pulmonary disease with (acute) exacerbation: Secondary | ICD-10-CM

## 2021-01-02 DIAGNOSIS — F17210 Nicotine dependence, cigarettes, uncomplicated: Secondary | ICD-10-CM | POA: Diagnosis not present

## 2021-01-02 DIAGNOSIS — M6281 Muscle weakness (generalized): Secondary | ICD-10-CM | POA: Diagnosis not present

## 2021-01-03 ENCOUNTER — Telehealth: Payer: Self-pay | Admitting: Student

## 2021-01-03 NOTE — Telephone Encounter (Signed)
Message left to confirm f/u palliative appointment for tomorrow. Awaiting return call.

## 2021-01-03 NOTE — Telephone Encounter (Signed)
Rxs recently sent to [facility].  Requests denied.

## 2021-01-04 ENCOUNTER — Telehealth: Payer: Self-pay

## 2021-01-04 ENCOUNTER — Other Ambulatory Visit: Payer: Medicare Other | Admitting: Student

## 2021-01-04 ENCOUNTER — Other Ambulatory Visit: Payer: Self-pay

## 2021-01-04 DIAGNOSIS — M810 Age-related osteoporosis without current pathological fracture: Secondary | ICD-10-CM

## 2021-01-04 NOTE — Telephone Encounter (Signed)
Benefits submitted-pending decision. Next injection due after 01/30/21 Last time patient received RX for Prolia through mail order pharmacy and received injection in the office.

## 2021-01-05 ENCOUNTER — Other Ambulatory Visit: Payer: Self-pay

## 2021-01-05 ENCOUNTER — Other Ambulatory Visit: Payer: Medicare Other | Admitting: Student

## 2021-01-05 DIAGNOSIS — R000 Tachycardia, unspecified: Secondary | ICD-10-CM | POA: Diagnosis not present

## 2021-01-05 DIAGNOSIS — Z515 Encounter for palliative care: Secondary | ICD-10-CM | POA: Diagnosis not present

## 2021-01-05 DIAGNOSIS — J449 Chronic obstructive pulmonary disease, unspecified: Secondary | ICD-10-CM | POA: Diagnosis not present

## 2021-01-05 DIAGNOSIS — J9611 Chronic respiratory failure with hypoxia: Secondary | ICD-10-CM | POA: Diagnosis not present

## 2021-01-05 DIAGNOSIS — J441 Chronic obstructive pulmonary disease with (acute) exacerbation: Secondary | ICD-10-CM | POA: Diagnosis not present

## 2021-01-05 DIAGNOSIS — F17210 Nicotine dependence, cigarettes, uncomplicated: Secondary | ICD-10-CM | POA: Diagnosis not present

## 2021-01-05 DIAGNOSIS — R64 Cachexia: Secondary | ICD-10-CM

## 2021-01-05 DIAGNOSIS — F339 Major depressive disorder, recurrent, unspecified: Secondary | ICD-10-CM

## 2021-01-05 DIAGNOSIS — M6281 Muscle weakness (generalized): Secondary | ICD-10-CM | POA: Diagnosis not present

## 2021-01-05 DIAGNOSIS — R630 Anorexia: Secondary | ICD-10-CM

## 2021-01-05 DIAGNOSIS — Z9981 Dependence on supplemental oxygen: Secondary | ICD-10-CM | POA: Diagnosis not present

## 2021-01-05 NOTE — Progress Notes (Signed)
[facility] Palliative Care Consult Note Telephone: [phone]  Fax: [phone]    Date of encounter: 01/05/21 PATIENT NAME: Christine Serrano [address] [place] [address]   [id number] (home)  DOB: 1960-12-09 MRN: [id number] PRIMARY CARE PROVIDER:    Ria Bush, MD,  [place] [address] [phone]  REFERRING PROVIDER:   Ria Bush, MD [address] [place],  [place] [address] [phone]  RESPONSIBLE PARTY:    Contact Information     Name Relation Home Work Mobile   [place]   [phone]        I met face to face with patient in the home. Palliative Care was asked to follow this patient by consultation request of  Ria Bush, MD to address advance care planning and complex medical decision making. This is a follow up visit.                                   ASSESSMENT AND PLAN / RECOMMENDATIONS:   Advance Care Planning/Goals of Care: Goals include to maximize quality of life and symptom management.  CODE STATUS: DNR  Symptom Management/Plan:  COPD-patient wears oxygen at 4 liters per minute continuously. Continue nebulizer/inhaler as directed. Prednisone 10 mg daily. Follow up with pulmonology as scheduled.   Appetite/cachexia-patient continues with poor appetite. Recommend encourage foods she enjoys, soft foods. Continue  Ensure TID. Recommend Meals on Wheels. Albumin 3.0 on 10/17/2020.   Depression-continue Wellbutrin XL $RemoveBefor'150mg'[id number]$  daily, mirtazapine 15 mg QHS, zoloft 150 mg daily. Recommend support counseling, encourage patient to participate in activities she enjoys, such as animal therapy.   Elevated heart rate-patient states her HR has been elevated recently, pulse 116 today. Will defer to PCP for management and she is encouraged to follow up soon. She denies any other symptoms.   Will refer back to palliative SW regarding counseling and Meals on Wheels as  she states she misplaced information.   Follow up Palliative Care Visit: Palliative care will continue to follow for complex medical decision making, advance care planning, and clarification of goals. Return in 8 weeks or prn.  I spent 60 minutes providing this consultation. More than 50% of the time in this consultation was spent in counseling and care coordination.   PPS: 40%  HOSPICE ELIGIBILITY/DIAGNOSIS: TBD  Chief Complaint: Palliative Medicine follow up visit.   HISTORY OF PRESENT ILLNESS:  Christine Serrano is a 60 y.o. year old female  with COPD, depression, cachexia/poor appetite.    Patient resides at home, has a roommate who is supportive. Her late fiance's sister also helps with care. She reports ongoing weight loss over the past two years. She states her lower dentures don't fit properly as bone has deteriorated. She is drinking 3 Ensures a day. Reports going through depression. Reports losses of her mother, father, pets in recent years. Had to get rid of her horses. She states her breathing has been stable. She uses nebulizer 3 times a day. Uses rescue inhaler as needed. Sleeping fair.she currently has [facility] coming out-PT and OT. She reports a fall about 6 weeks ago.    History obtained from review of EMR, discussion with primary team, and interview with family, facility staff/caregiver and/or Ms. Christner.  I reviewed available labs, medications, imaging, studies and related documents from the EMR.  Records reviewed and summarized above.   ROS  General:  NAD EYES: denies vision changes ENMT: denies dysphagia Cardiovascular: denies chest pain Pulmonary: cough, SOB with exertion Abdomen: endorses poor appetite, denies constipation, endorses continence of bowel GU: denies dysuria MSK: weakness Skin: denies rashes or wounds Neurological: denies pain, sleep is fair Psych: Endorses depressed mood Heme/lymph/immuno: denies bruises, abnormal bleeding  Physical  Exam: pulse 116, resp 20, b/p 110/58, sats 98% at 4lpm Weight: 92 pounds currently, 125 pound 1 to 2 years ago.  Constitutional: NAD General: frail appearing, very thin EYES: anicteric sclera, lids intact, no discharge  ENMT: intact hearing, oral mucous membranes moist CV: S1S2, RRR, no LE edema Pulmonary: LCTA, no increased work of breathing Abdomen: intake 100%, normo-active BS + 4 quadrants, soft and non tender GU: deferred MSK: sarcopenia, moves all extremities, ambulatory Skin: cool and dry, no rashes or wounds on visible skin Neuro: generalized weakness, A & O x 3 Psych: non-anxious affect Hem/lymph/immuno: no widespread bruising   Thank you for the opportunity to participate in the care of Ms. Ivy.  The palliative care team will continue to follow. Please call our office at [phone] if we can be of additional assistance.   Ezekiel Slocumb, NP   COVID-19 PATIENT SCREENING TOOL Asked and negative response unless otherwise noted:   Have you had symptoms of covid, tested positive or been in contact with someone with symptoms/positive test in the past 5-10 days?  No

## 2021-01-09 DIAGNOSIS — J9611 Chronic respiratory failure with hypoxia: Secondary | ICD-10-CM | POA: Diagnosis not present

## 2021-01-09 DIAGNOSIS — M6281 Muscle weakness (generalized): Secondary | ICD-10-CM | POA: Diagnosis not present

## 2021-01-09 DIAGNOSIS — Z9981 Dependence on supplemental oxygen: Secondary | ICD-10-CM | POA: Diagnosis not present

## 2021-01-09 DIAGNOSIS — J441 Chronic obstructive pulmonary disease with (acute) exacerbation: Secondary | ICD-10-CM | POA: Diagnosis not present

## 2021-01-09 DIAGNOSIS — F17210 Nicotine dependence, cigarettes, uncomplicated: Secondary | ICD-10-CM | POA: Diagnosis not present

## 2021-01-11 ENCOUNTER — Telehealth: Payer: Self-pay

## 2021-01-11 DIAGNOSIS — J9611 Chronic respiratory failure with hypoxia: Secondary | ICD-10-CM | POA: Diagnosis not present

## 2021-01-11 DIAGNOSIS — F17210 Nicotine dependence, cigarettes, uncomplicated: Secondary | ICD-10-CM | POA: Diagnosis not present

## 2021-01-11 DIAGNOSIS — M6281 Muscle weakness (generalized): Secondary | ICD-10-CM | POA: Diagnosis not present

## 2021-01-11 DIAGNOSIS — J441 Chronic obstructive pulmonary disease with (acute) exacerbation: Secondary | ICD-10-CM | POA: Diagnosis not present

## 2021-01-11 DIAGNOSIS — Z9981 Dependence on supplemental oxygen: Secondary | ICD-10-CM | POA: Diagnosis not present

## 2021-01-11 NOTE — Telephone Encounter (Signed)
01/11/21 '@11'$  AM: Palliative care SW outreached patient as follow up, per [facility] NP- L. Rivers request to address mental health referral needs.  Call unsuccessful. SW LVM with information for [facility] [phone], again, as SW made original referral 11/24/20 - patient was assigned a provider,  Myles Gip, whom attempted to connect with patient but had unsuccessful outreaches to patient. SW also provided information with alternate mental clinic [facility] [phone].   Patient has resources and contact info to initiate ental health services if desired.

## 2021-01-12 ENCOUNTER — Telehealth: Payer: Self-pay

## 2021-01-12 NOTE — Telephone Encounter (Signed)
Trazadone Last OV:  10/24/20, arm injury Next OV:  06/08/21, AWV prt 2

## 2021-01-12 NOTE — Telephone Encounter (Signed)
Pt left VM on triage line requesting a refill on trazodone. This medication is not on pt's med list and has not been on it since 2021. Please advise.

## 2021-01-15 ENCOUNTER — Telehealth: Payer: Self-pay | Admitting: Family Medicine

## 2021-01-15 NOTE — Telephone Encounter (Signed)
See Phn note, 01/12/21.

## 2021-01-15 NOTE — Telephone Encounter (Signed)
Spoke with pt relaying Dr. G's message. Pt verbalizes understanding.  

## 2021-01-15 NOTE — Telephone Encounter (Signed)
Sshe is now on mirtazapine to replace trazodone.

## 2021-01-15 NOTE — Telephone Encounter (Signed)
  Encourage patient to contact the pharmacy for refills or they can request refills through [place]:  Please schedule appointment if longer than 1 year  NEXT APPOINTMENT DATE:  MEDICATION: trazadone  Is the patient out of medication? yes  [facility] [address]   Let patient know to contact pharmacy at the end of the day to make sure medication is ready.  Please notify patient to allow 48-72 hours to process  CLINICAL FILLS OUT ALL BELOW:   LAST REFILL:  QTY:  REFILL DATE:    OTHER COMMENTS:    Okay for refill?  Please advise

## 2021-01-19 DIAGNOSIS — J449 Chronic obstructive pulmonary disease, unspecified: Secondary | ICD-10-CM | POA: Diagnosis not present

## 2021-01-20 ENCOUNTER — Other Ambulatory Visit: Payer: Self-pay | Admitting: Family Medicine

## 2021-01-20 DIAGNOSIS — F331 Major depressive disorder, recurrent, moderate: Secondary | ICD-10-CM

## 2021-01-20 DIAGNOSIS — F419 Anxiety disorder, unspecified: Secondary | ICD-10-CM

## 2021-01-20 NOTE — Telephone Encounter (Signed)
Pt ready for scheduling on or after 01/30/21  Preferred Specialty Pharmacy: [facility] Estimated out-of-pocket cost: $280  Primary: [employer] Medicare Prolia co-insurance: 20% ($255) Admin fee co-insurance: 20% ($25)  Secondary: n/a Prolia co-insurance:  Admin fee co-insurance:   Deductible: does not apply  Prior Auth: not required PA# Valid:

## 2021-01-22 MED ORDER — DENOSUMAB 60 MG/ML ~~LOC~~ SOSY: 60.0000 mg | PREFILLED_SYRINGE | Freq: Once | SUBCUTANEOUS | 0 refills | Status: AC

## 2021-01-22 NOTE — Telephone Encounter (Signed)
Spoke with patient. Patient gets Prolia RX sent to her mail order pharmacy instead of going through our office ordering, this is cheaper for her. RX sent in, patient did not mind if RX ends up been sent to her home instead of our office. I will follow up with [facility] to make sure this gets processed and that there are no further issues with this.  Lab appointment on 01/24/21 Nurse Visit-at the car per patient's request- 01/31/21

## 2021-01-22 NOTE — Addendum Note (Signed)
Addended by: Kris Mouton on: 01/22/2021 11:34 AM   Modules accepted: Orders

## 2021-01-23 NOTE — Telephone Encounter (Signed)
Ok to send 90-day rx?  Last OV:  11/15/20 COPD exacerbation Next OV:  06/08/21, AWV prt 2

## 2021-01-23 NOTE — Telephone Encounter (Signed)
ERx 

## 2021-01-24 ENCOUNTER — Other Ambulatory Visit: Payer: Medicare Other

## 2021-01-24 ENCOUNTER — Telehealth: Payer: Self-pay

## 2021-01-24 MED ORDER — MIRTAZAPINE 7.5 MG PO TABS: 7.5000 mg | ORAL_TABLET | Freq: Every day | ORAL | 3 refills | Status: DC

## 2021-01-24 NOTE — Telephone Encounter (Signed)
Pt left v/m that previously Dr Darnell Level had stopped the trazodone and pt is requesting to restart the trazodone because pt is not sleeping. Pt request cb when done. I left v/m for pt to cb to see when pt stopped the trazodone, what dosage of trazodone the pt was taking and is pt presently taking the mirtazapine 15 mg at hs. Sending note to Dr Darnell Level and lisa CMA.

## 2021-01-24 NOTE — Telephone Encounter (Signed)
Spoke with [facility] and asked to process Prolia and scheduled delivery date by 01/25/21 but they do need to speak with patient to get authorization on this. They called patient and left her a message. I called patient today also and left her a message to call me back. Need to make sure she speaks with [facility] so they can send the medication here

## 2021-01-24 NOTE — Telephone Encounter (Signed)
Agree need to review with patient. She is now on mirtazapine '15mg'$  daily. If having trouble sleeping, we could drop dose to 7.'5mg'$  daily (lower doses are actually more sedating). I have sent this in for her to try.

## 2021-01-25 NOTE — Telephone Encounter (Signed)
Called patient to follow up and there was no answer again. I called [name]- ok per DPR on file, and advised to let patient know she needs to speak with [facility] to give permission for Prolia to be shipped to [place].

## 2021-01-25 NOTE — Telephone Encounter (Signed)
Spoke with pt relaying Dr. Synthia Innocent message.  Pt verbalizes understanding and will let Dr. Darnell Level know if it helps or not.

## 2021-01-26 NOTE — Telephone Encounter (Signed)
Called patient again and left a message. Patient also did not come for her lab appointment and need to reschedule that in order to proceed with Prolia Injection. I have had hard time reaching patient. May have to cancel nurse visit if I do not hear from patient.  FYI to PCP to let him know that patient has not been responding back to me so far.

## 2021-01-29 NOTE — Telephone Encounter (Signed)
Per chart lab appointment made for 01/30/21

## 2021-01-29 NOTE — Telephone Encounter (Signed)
Noted! Thank you

## 2021-01-30 ENCOUNTER — Other Ambulatory Visit: Payer: Medicare Other

## 2021-01-30 NOTE — Telephone Encounter (Signed)
Terri spoke with patient on the phone. Patient rescheduled labs to 02/05/21 and NV to 02/08/21, while Karna Christmas was on the phone with patient [facility] was calling her so Terri let patient go to speak with them about Prolia.

## 2021-01-31 ENCOUNTER — Ambulatory Visit: Payer: Medicare Other

## 2021-02-01 DIAGNOSIS — J9611 Chronic respiratory failure with hypoxia: Secondary | ICD-10-CM | POA: Diagnosis not present

## 2021-02-01 DIAGNOSIS — Z7409 Other reduced mobility: Secondary | ICD-10-CM | POA: Diagnosis not present

## 2021-02-01 DIAGNOSIS — J449 Chronic obstructive pulmonary disease, unspecified: Secondary | ICD-10-CM | POA: Diagnosis not present

## 2021-02-05 ENCOUNTER — Other Ambulatory Visit: Payer: Medicare Other

## 2021-02-05 DIAGNOSIS — J449 Chronic obstructive pulmonary disease, unspecified: Secondary | ICD-10-CM | POA: Diagnosis not present

## 2021-02-06 NOTE — Telephone Encounter (Signed)
Patient did not show up for her 02/05/21 lab appointment again. I called and left detailed message for the patient letting her know that we can not proceed with Prolia injection until labs are checked and if she can not come today or early tomorrow morning her nurse visit on 02/08/21 will be cancelled.  I have made several attempts to reach patient and have her come in for this, patient has failed through completing this process. I will wait for patient to respond back and follow through this process, no further calls will be made for now unless PCP advises me otherwise.  We did finally get her Prolia injection in from [place]. FYI To PCP And Randall An

## 2021-02-08 ENCOUNTER — Ambulatory Visit: Payer: Medicare Other

## 2021-02-12 ENCOUNTER — Other Ambulatory Visit: Payer: Medicare Other

## 2021-02-14 ENCOUNTER — Ambulatory Visit: Payer: Medicare Other

## 2021-02-14 ENCOUNTER — Ambulatory Visit: Payer: Medicare Other | Admitting: Family Medicine

## 2021-02-14 DIAGNOSIS — J449 Chronic obstructive pulmonary disease, unspecified: Secondary | ICD-10-CM | POA: Diagnosis not present

## 2021-02-14 DIAGNOSIS — J9611 Chronic respiratory failure with hypoxia: Secondary | ICD-10-CM | POA: Diagnosis not present

## 2021-02-14 DIAGNOSIS — Z7409 Other reduced mobility: Secondary | ICD-10-CM | POA: Diagnosis not present

## 2021-02-16 ENCOUNTER — Ambulatory Visit (INDEPENDENT_AMBULATORY_CARE_PROVIDER_SITE_OTHER): Payer: Medicare Other | Admitting: Family Medicine

## 2021-02-16 ENCOUNTER — Other Ambulatory Visit: Payer: Self-pay

## 2021-02-16 ENCOUNTER — Encounter: Payer: Self-pay | Admitting: Family Medicine

## 2021-02-16 VITALS — BP 118/68 | HR 106 | Temp 97.8°F | Ht 64.0 in | Wt 97.5 lb

## 2021-02-16 DIAGNOSIS — J449 Chronic obstructive pulmonary disease, unspecified: Secondary | ICD-10-CM

## 2021-02-16 DIAGNOSIS — Z9981 Dependence on supplemental oxygen: Secondary | ICD-10-CM

## 2021-02-16 DIAGNOSIS — F5104 Psychophysiologic insomnia: Secondary | ICD-10-CM | POA: Diagnosis not present

## 2021-02-16 DIAGNOSIS — Z7409 Other reduced mobility: Secondary | ICD-10-CM

## 2021-02-16 DIAGNOSIS — Z515 Encounter for palliative care: Secondary | ICD-10-CM

## 2021-02-16 DIAGNOSIS — R600 Localized edema: Secondary | ICD-10-CM

## 2021-02-16 DIAGNOSIS — M810 Age-related osteoporosis without current pathological fracture: Secondary | ICD-10-CM

## 2021-02-16 DIAGNOSIS — J9611 Chronic respiratory failure with hypoxia: Secondary | ICD-10-CM

## 2021-02-16 LAB — UNMAPPED LAB RESULTS
Hematocrit (HT): 43 % (ref 34–47)
Hemoglobin (HGB) (HT): 14.9 g/dL (ref 11.5–16.0)
MCHC (HT): 34.7 g/dL (ref 32.0–36.0)
MCV (HT): 86 fL (ref 81.0–99.0)
Mean Corpuscular Hemoglobin (MCH) (HT): 29.9 pg (ref 26.0–34.0)
Platelets (HT): 235 10 3/uL (ref 140–400)
RBC (HT): 4.99 10 6/uL (ref 3.80–5.20)
RDW (HT): 13 % (ref 11.5–15.0)
WBC (HT): 7 10 3/uL (ref 4.0–10.8)

## 2021-02-16 LAB — TREPONEMA PALLIDUM (SYPHILIS) ANTIBODY (HT): Syphilis Treponemal Ab (HT): NONREACTIVE

## 2021-02-16 NOTE — Progress Notes (Signed)
Patient ID: Christine Serrano, female    DOB: Jun 01, 1961, 60 y.o.   MRN: [id number]  This visit was conducted in person.  BP 118/68   Pulse (!) 106   Temp 97.8 F (36.6 C) (Temporal)   Ht '5\' 4"'$  (1.626 m)   Wt 97 lb 8 oz (44.2 kg)   LMP  (LMP Unknown)   SpO2 92%   BMI 16.74 kg/m    Low oxygen on 80% when she arrived on 1L (her tank was about to run out) - O2 increases to 90-92% when placed in our O2 tank at 2L [place]. She also has thicker nails (gel) making it difficult to get reading.  CC: discuss foot swelling L>R Subjective:   HPI: Christine Serrano is a 60 y.o. female presenting on 02/16/2021 for Foot Swelling (C/o bilateral feet swelling, worse in left.  Also, needs BMP and Prolia inj.)   Angie brought her here, waiting outside.   Several days of bilateral foot swelling L>R. Denies inciting trauma/injury or falls. Ongoing struggle with dyspnea. Denies chest pain, swelling elsewhere, dizziness. No increased salt in diet however she has been eating more lo mein recently from local [employer].  She is drinking 3 ensures daily, as well as breakfast/lunch/dinner.   She finally got motorized wheelchair. Friend built her a ramp to be able to access inside/outside.   Overdue for prolia labs - will check BMP. Difficulty reaching patient over the past month to be able to coordinate prolia. We have received her prolia from [facility].   Insomnia - now on mirtazapine 7.'5mg'$  nightly. Sleeping better with this.  5 lb weight gain since last visit as well!      Relevant past medical, surgical, family and social history reviewed and updated as indicated. Interim medical history since our last visit reviewed. Allergies and medications reviewed and updated. Outpatient Medications Prior to Visit  Medication Sig Dispense Refill   albuterol (PROVENTIL) (2.5 MG/3ML) 0.083% nebulizer solution Take 3 mLs (2.5 mg total) by nebulization every 6 (six) hours as needed for wheezing or shortness of  breath. 150 mL 6   albuterol (VENTOLIN HFA) 108 (90 Base) MCG/ACT inhaler TAKE 2 PUFFS BY MOUTH EVERY 6 HOURS AS NEEDED FOR WHEEZE OR SHORTNESS OF BREATH 18 each 6   Ascorbic Acid (VITAMIN C WITH ROSE HIPS) 500 MG tablet Take 500 mg by mouth daily.     aspirin EC 81 MG tablet Take 81 mg by mouth daily.     benzonatate (TESSALON) 100 MG capsule Take 1 capsule (100 mg total) by mouth 3 (three) times daily as needed for cough. 30 capsule 11   Budeson-Glycopyrrol-Formoterol (BREZTRI AEROSPHERE) 160-9-4.8 MCG/ACT AERO Inhale 2 puffs into the lungs in the morning and at bedtime. 32.1 g 3   buPROPion (WELLBUTRIN XL) 150 MG 24 hr tablet Take 1 tablet (150 mg total) by mouth daily. 90 tablet 3   cholecalciferol (VITAMIN D3) 25 MCG (1000 UNIT) tablet Take 1,000 Units by mouth daily.     doxycycline (VIBRA-TABS) 100 MG tablet Take 1 tablet (100 mg total) by mouth 2 (two) times daily. 14 tablet 0   fluticasone (FLONASE) 50 MCG/ACT nasal spray Place 2 sprays into both nostrils daily. 48 g 3   folic acid (FOLVITE) 1 MG tablet Take 1 tablet (1 mg total) by mouth daily. 90 tablet 3   gabapentin (NEURONTIN) 100 MG capsule Take 1 capsule (100 mg total) by mouth 2 (two) times daily. 180 capsule 3  hydrOXYzine (ATARAX/VISTARIL) 25 MG tablet Take 1 tablet (25 mg total) by mouth in the morning and at bedtime. 180 tablet 1   LINZESS 290 MCG CAPS capsule TAKE 1 CAPSULE EVERY DAY AS NEEDED FOR CONSTIPATION AS DIRECTED 90 capsule 3   meclizine (ANTIVERT) 25 MG tablet Take 1 tablet (25 mg total) by mouth 3 (three) times daily as needed for dizziness. 30 tablet 0   methocarbamol (ROBAXIN) 500 MG tablet TAKE 1 TABLET BY MOUTH 3 TIMES DAILY AS NEEDED FOR MUSCLE SPASMS (SEDATION PRECAUTIONS). 90 tablet 0   mirtazapine (REMERON) 7.5 MG tablet Take 1 tablet (7.5 mg total) by mouth at bedtime. 30 tablet 3   Multiple Vitamin (MULTIVITAMIN WITH MINERALS) TABS tablet Take 1 tablet by mouth daily.     naproxen (NAPROSYN) 375 MG tablet  TAKE 1 TABLET BY MOUTH TWICE A DAY AS NEEDED FOR PAIN WITH FOOD 40 tablet 3   omeprazole (PRILOSEC) 40 MG capsule TAKE 1 CAPSULE BY MOUTH DAILY. FOR THREE WEEKS THEN AS NEEDED. TAKE 30 MIN PRIOR TO LARGE MEAL 90 capsule 1   OXYGEN Inhale 4 L/min into the lungs continuous.      polyethylene glycol powder (GLYCOLAX/MIRALAX) 17 GM/SCOOP powder Take 17 g by mouth daily as needed for moderate constipation. 3350 g 1   predniSONE (DELTASONE) 20 MG tablet Take 2 tablets (40 mg total) by mouth daily. For 5 days, then 1 tab daily for 5 days. 15 tablet 0   predniSONE (DELTASONE) 5 MG tablet TAKE 2 TABLETS DAILY WITH BREAKFAST OR AS DIRECTED 60 tablet 3   sertraline (ZOLOFT) 100 MG tablet TAKE 1 AND 1/2 TABLETS BY MOUTH EVERY DAY 135 tablet 1   thiamine 100 MG tablet Take 1 tablet (100 mg total) by mouth daily. 30 tablet 0   vitamin B-12 (CYANOCOBALAMIN) 1000 MCG tablet Take 1 tablet (1,000 mcg total) by mouth daily.     No facility-administered medications prior to visit.     Per HPI unless specifically indicated in ROS section below Review of Systems  Objective:  BP 118/68   Pulse (!) 106   Temp 97.8 F (36.6 C) (Temporal)   Ht '5\' 4"'$  (1.626 m)   Wt 97 lb 8 oz (44.2 kg)   LMP  (LMP Unknown)   SpO2 92%   BMI 16.74 kg/m   Wt Readings from Last 3 Encounters:  02/16/21 97 lb 8 oz (44.2 kg)  11/22/20 94 lb 12.8 oz (43 kg)  11/22/20 94 lb (42.6 kg)      Physical Exam Vitals and nursing note reviewed.  Constitutional:      Appearance: Normal appearance. She is ill-appearing (chronic).  Cardiovascular:     Rate and Rhythm: Normal rate and regular rhythm.     Pulses: Normal pulses.     Heart sounds: Normal heart sounds. No murmur heard. Pulmonary:     Effort: Pulmonary effort is normal. No respiratory distress.     Breath sounds: Wheezing (coarse R>L) present. No rhonchi or rales.     Comments: Coarse breath sounds bilaterally Musculoskeletal:     Right lower leg: No edema.     Left lower  leg: No edema.     Comments:  2+ DP bilaterally Trace L dorsal foot swelling  Skin:    General: Skin is warm and dry.     Findings: No rash.  Neurological:     Mental Status: She is alert.  Psychiatric:        Mood and Affect: Mood normal.  Behavior: Behavior normal.      Results for orders placed or performed in visit on 02/16/21  CBC with Differential/Platelet  Result Value Ref Range   WBC 5.3 3.8 - 10.8 Thousand/uL   RBC 4.02 3.80 - 5.10 Million/uL   Hemoglobin 10.9 (L) 11.7 - 15.5 g/dL   HCT 34.0 (L) 35.0 - 45.0 %   MCV 84.6 80.0 - 100.0 fL   MCH 27.1 27.0 - 33.0 pg   MCHC 32.1 32.0 - 36.0 g/dL   RDW 13.9 11.0 - 15.0 %   Platelets 212 140 - 400 Thousand/uL   MPV 11.1 7.5 - 12.5 fL   Neutro Abs 4,362 1,500 - 7,800 cells/uL   Lymphs Abs 721 (L) 850 - 3,900 cells/uL   Absolute Monocytes 148 (L) 200 - 950 cells/uL   Eosinophils Absolute 21 15 - 500 cells/uL   Basophils Absolute 48 0 - 200 cells/uL   Neutrophils Relative % 82.3 %   Total Lymphocyte 13.6 %   Monocytes Relative 2.8 %   Eosinophils Relative 0.4 %   Basophils Relative 0.9 %  Comprehensive metabolic panel  Result Value Ref Range   Glucose, Bld 93 65 - 99 mg/dL   BUN 9 7 - 25 mg/dL   Creat 0.58 0.50 - 1.05 mg/dL   BUN/Creatinine Ratio NOT APPLICABLE 6 - 22 (calc)   Sodium 141 135 - 146 mmol/L   Potassium 3.7 3.5 - 5.3 mmol/L   Chloride 99 98 - 110 mmol/L   CO2 36 (H) 20 - 32 mmol/L   Calcium 8.9 8.6 - 10.4 mg/dL   Total Protein 5.7 (L) 6.1 - 8.1 g/dL   Albumin 3.6 3.6 - 5.1 g/dL   Globulin 2.1 1.9 - 3.7 g/dL (calc)   AG Ratio 1.7 1.0 - 2.5 (calc)   Total Bilirubin 0.3 0.2 - 1.2 mg/dL   Alkaline phosphatase (APISO) 114 37 - 153 U/L   AST 30 10 - 35 U/L   ALT 18 6 - 29 U/L    Assessment & Plan:  This visit occurred during the SARS-CoV-2 public health emergency.  Safety protocols were in place, including screening questions prior to the visit, additional usage of staff PPE, and extensive  cleaning of exam room while observing appropriate contact time as indicated for disinfecting solutions.   Problem List Items Addressed This Visit     COPD, very severe ([place])   Chronic insomnia    Doing better on lower mirtazapine dose.       Osteoporosis    Overdue for prolia - our office had difficulty reaching patient to schedule. Will check Cr today and if normal, proceed with scheduling prolia injection. It seems we already have received her prolia injection from pharmacy.       Impaired mobility    Happy with her new power wheelchair       Chronic respiratory failure with hypoxia, on home O2 therapy (HCC)   Palliative care status   Pedal edema - Primary    Mild on exam today.  Anticipate related to recently increased chinese food (high sodium) as well as low protein levels. Encouraged leg elevation, increased water, limit sodium, and continued protein supplement use.  Update if persistent, could consider compression stockings.       Relevant Orders   CBC with Differential/Platelet (Completed)   Comprehensive metabolic panel (Completed)     No orders of the defined types were placed in this encounter.  Orders Placed This Encounter  Procedures   CBC  with Differential/Platelet   Comprehensive metabolic panel     Patient Instructions  Labs today  Keep levs elevated Watch salt/sodium in the diet, look at food labels/ingredients.   Follow up plan: Return if symptoms worsen or fail to improve.  Ria Bush, MD

## 2021-02-16 NOTE — Patient Instructions (Addendum)
Labs today  Keep levs elevated Watch salt/sodium in the diet, look at food labels/ingredients.

## 2021-02-17 DIAGNOSIS — R600 Localized edema: Secondary | ICD-10-CM | POA: Insufficient documentation

## 2021-02-17 LAB — COMPREHENSIVE METABOLIC PANEL
AG Ratio: 1.7 (calc) (ref 1.0–2.5)
ALT: 18 U/L (ref 6–29)
AST: 30 U/L (ref 10–35)
Albumin: 3.6 g/dL (ref 3.6–5.1)
Alkaline phosphatase (APISO): 114 U/L (ref 37–153)
BUN: 9 mg/dL (ref 7–25)
CO2: 36 mmol/L — ABNORMAL HIGH (ref 20–32)
Calcium: 8.9 mg/dL (ref 8.6–10.4)
Chloride: 99 mmol/L (ref 98–110)
Creat: 0.58 mg/dL (ref 0.50–1.05)
Globulin: 2.1 g/dL (calc) (ref 1.9–3.7)
Glucose, Bld: 93 mg/dL (ref 65–99)
Potassium: 3.7 mmol/L (ref 3.5–5.3)
Sodium: 141 mmol/L (ref 135–146)
Total Bilirubin: 0.3 mg/dL (ref 0.2–1.2)
Total Protein: 5.7 g/dL — ABNORMAL LOW (ref 6.1–8.1)

## 2021-02-17 LAB — CBC WITH DIFFERENTIAL/PLATELET
Absolute Monocytes: 148 cells/uL — ABNORMAL LOW (ref 200–950)
Basophils Absolute: 48 cells/uL (ref 0–200)
Basophils Relative: 0.9 %
Eosinophils Absolute: 21 cells/uL (ref 15–500)
Eosinophils Relative: 0.4 %
HCT: 34 % — ABNORMAL LOW (ref 35.0–45.0)
Hemoglobin: 10.9 g/dL — ABNORMAL LOW (ref 11.7–15.5)
Lymphs Abs: 721 cells/uL — ABNORMAL LOW (ref 850–3900)
MCH: 27.1 pg (ref 27.0–33.0)
MCHC: 32.1 g/dL (ref 32.0–36.0)
MCV: 84.6 fL (ref 80.0–100.0)
MPV: 11.1 fL (ref 7.5–12.5)
Monocytes Relative: 2.8 %
Neutro Abs: 4362 cells/uL (ref 1500–7800)
Neutrophils Relative %: 82.3 %
Platelets: 212 10*3/uL (ref 140–400)
RBC: 4.02 10*6/uL (ref 3.80–5.10)
RDW: 13.9 % (ref 11.0–15.0)
Total Lymphocyte: 13.6 %
WBC: 5.3 10*3/uL (ref 3.8–10.8)

## 2021-02-17 NOTE — Assessment & Plan Note (Signed)
Mild on exam today.  Anticipate related to recently increased chinese food (high sodium) as well as low protein levels. Encouraged leg elevation, increased water, limit sodium, and continued protein supplement use.  Update if persistent, could consider compression stockings.

## 2021-02-17 NOTE — Assessment & Plan Note (Signed)
Happy with her new power wheelchair

## 2021-02-17 NOTE — Assessment & Plan Note (Signed)
Doing better on lower mirtazapine dose.

## 2021-02-17 NOTE — Assessment & Plan Note (Addendum)
Overdue for prolia - our office had difficulty reaching patient to schedule. Will check Cr today and if normal, proceed with scheduling prolia injection. It seems we already have received her prolia injection from pharmacy.

## 2021-02-17 NOTE — Telephone Encounter (Signed)
Labs done on 02/16/21 CrCl IS 70.17 mL/min. Calcium normal at 8.9  Patient advised scheduled for Nurse visit on 02/20/21. Prolia received from the pharmacy. Do not charge for medication note added to the app desk.

## 2021-02-19 DIAGNOSIS — J449 Chronic obstructive pulmonary disease, unspecified: Secondary | ICD-10-CM | POA: Diagnosis not present

## 2021-02-20 ENCOUNTER — Ambulatory Visit: Payer: Medicare Other

## 2021-02-22 DIAGNOSIS — H04123 Dry eye syndrome of bilateral lacrimal glands: Secondary | ICD-10-CM | POA: Diagnosis not present

## 2021-02-22 DIAGNOSIS — H2513 Age-related nuclear cataract, bilateral: Secondary | ICD-10-CM | POA: Diagnosis not present

## 2021-02-22 IMAGING — CT CT HEAD W/O CM
3 series · 16 of 47 positions shown, 19 images · non-contrast
Comparison: None.

CLINICAL DATA: Nonspecific dizziness.

EXAM:
CT HEAD WITHOUT CONTRAST
TECHNIQUE: Contiguous axial images were obtained from the base of the skull
through the vertex without intravenous contrast.

[Series 2: head wo · axial · 0.39mm/px · z∈[-141,-16]mm · 10 of 30 slices shown, 13 images]
[im 3/30  brain]
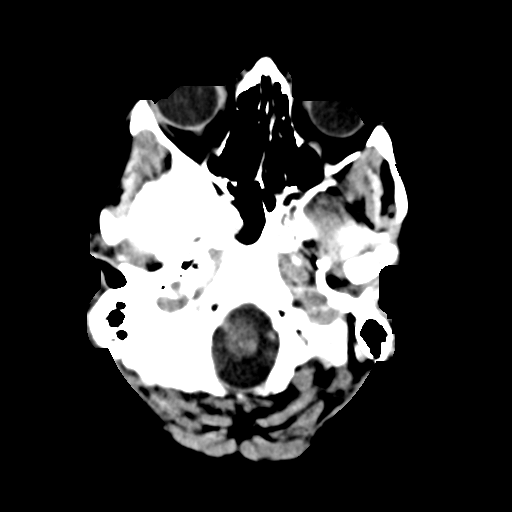
[im 3/30  bone]
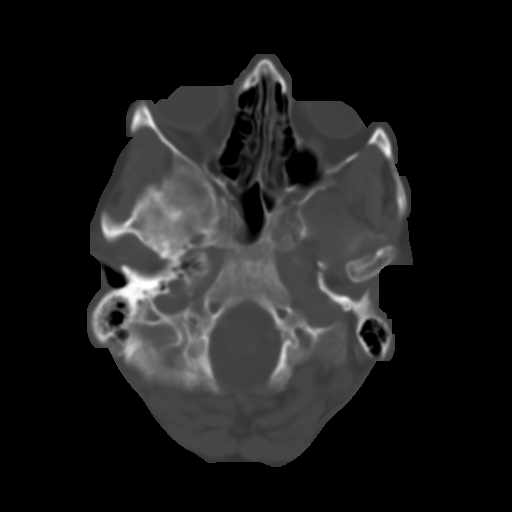
[im 6/30  brain]
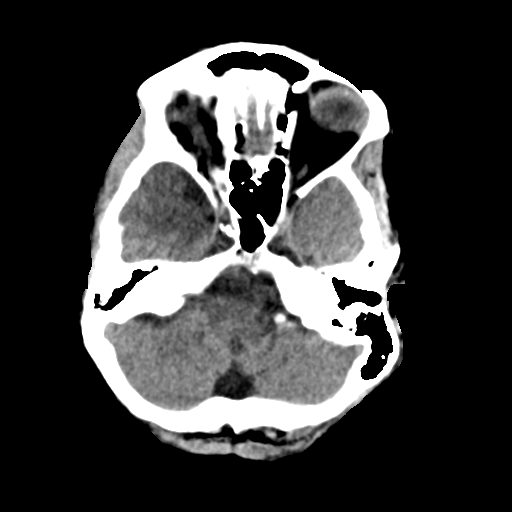
[im 9/30  brain]
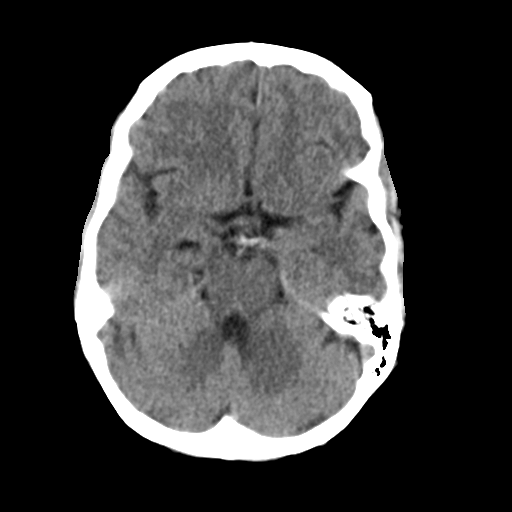
[im 11/30  brain]
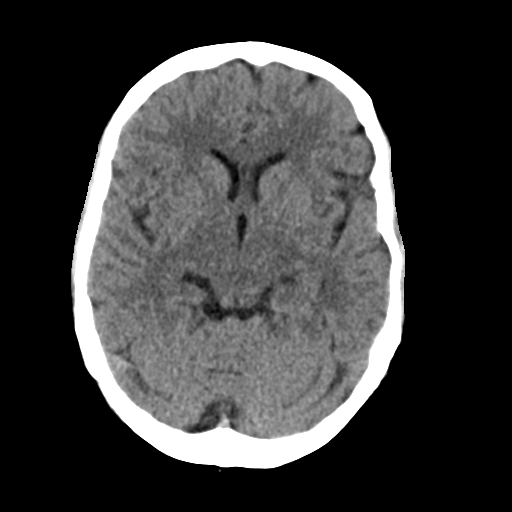
[im 14/30  brain]
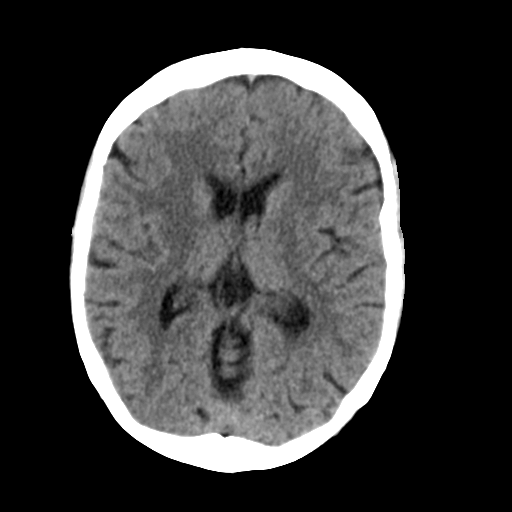
[im 14/30  bone]
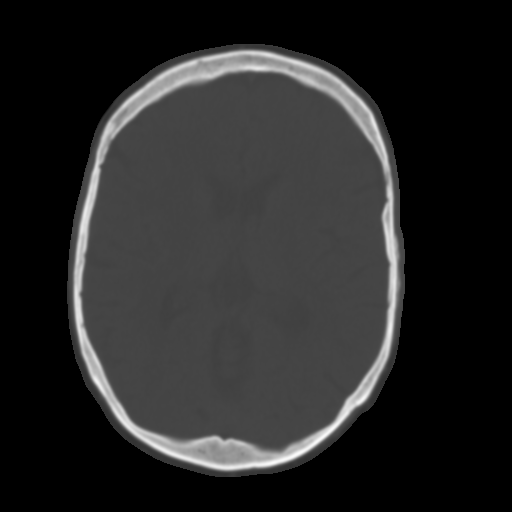
[im 17/30  brain]
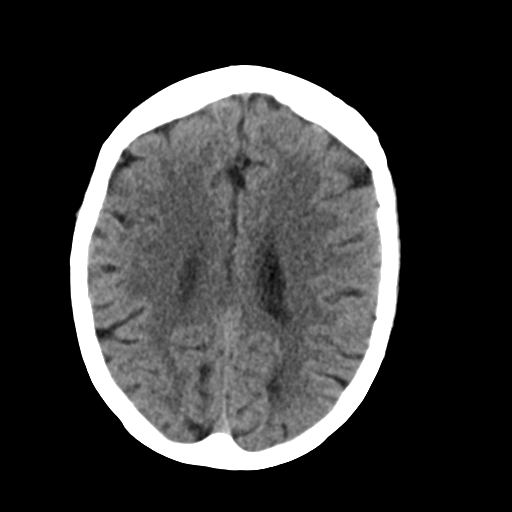
[im 20/30  brain]
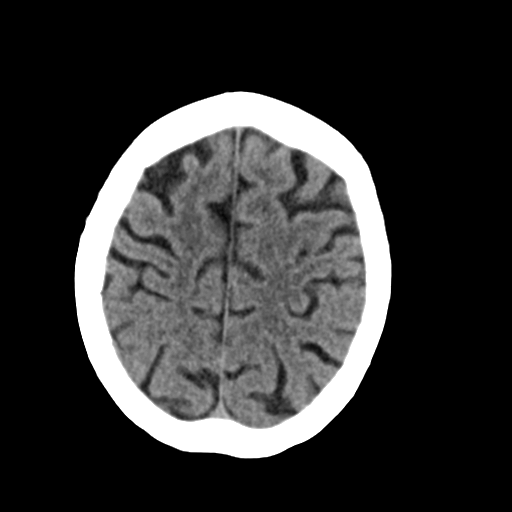
[im 23/30  brain]
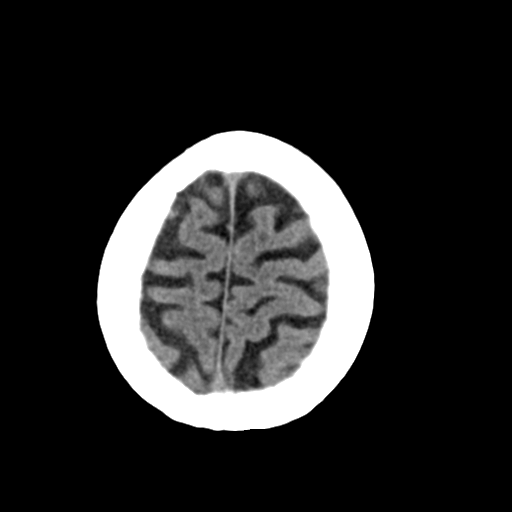
[im 25/30  brain]
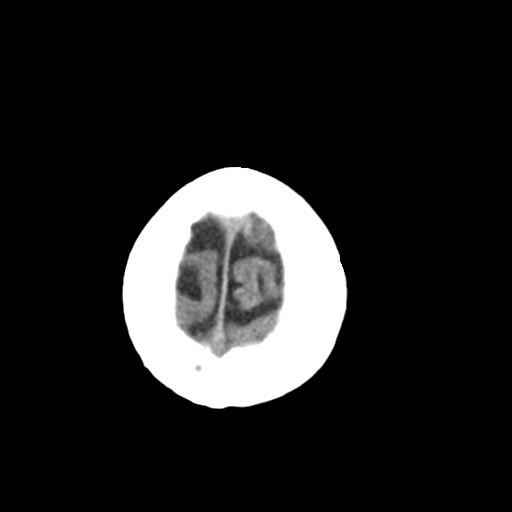
[im 25/30  bone]
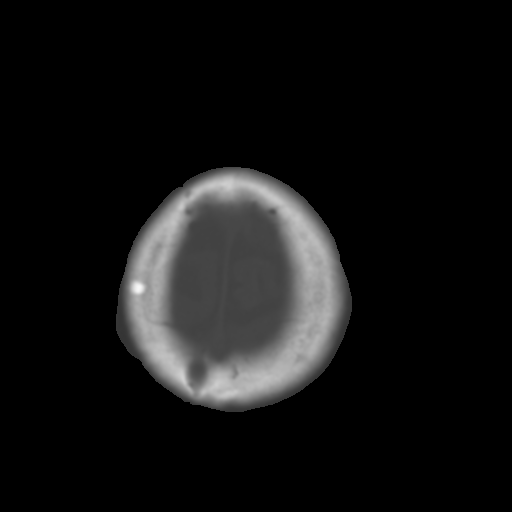
[im 28/30  brain]
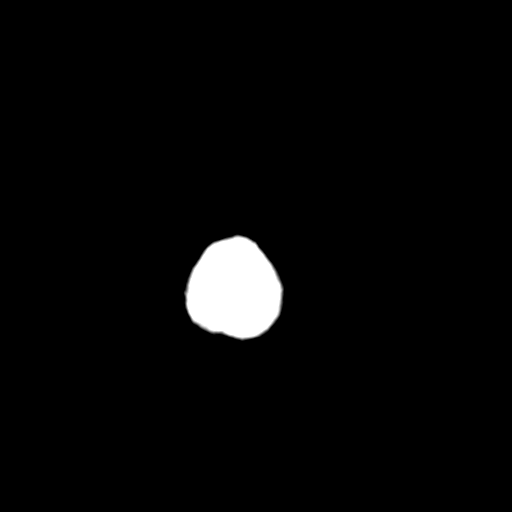

[Series 4: coronal soft tissue · coronal · 0.28mm/px · 3 of 64 slices shown]
[im 22/64  brain]
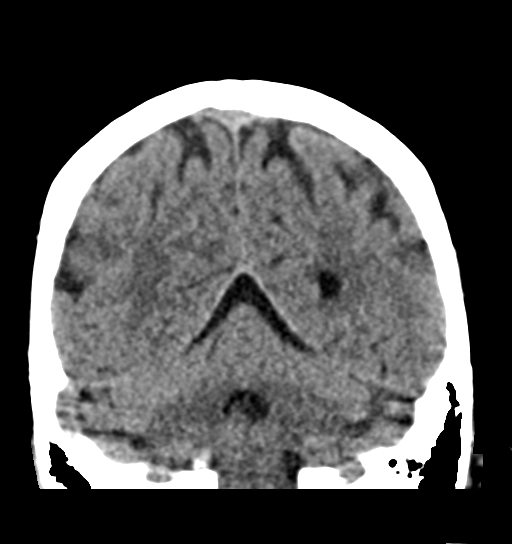
[im 29/64  brain]
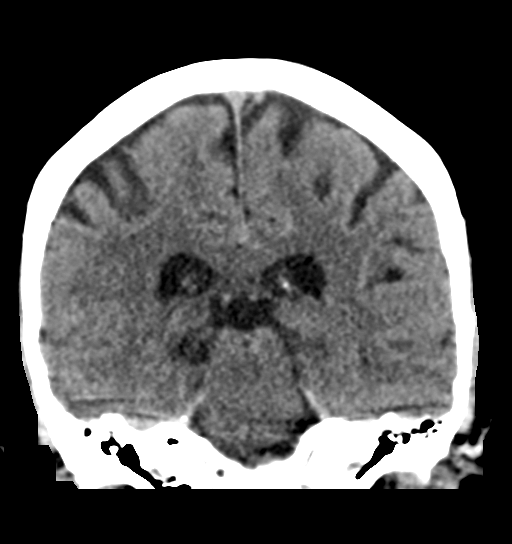
[im 36/64  brain]
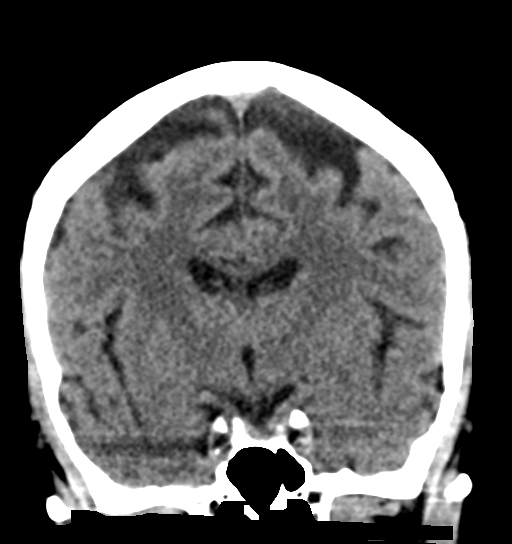

[Series 5: sagittal soft tissue · sagittal · 0.30mm/px · 3 of 49 slices shown]
[im 17/49  brain]
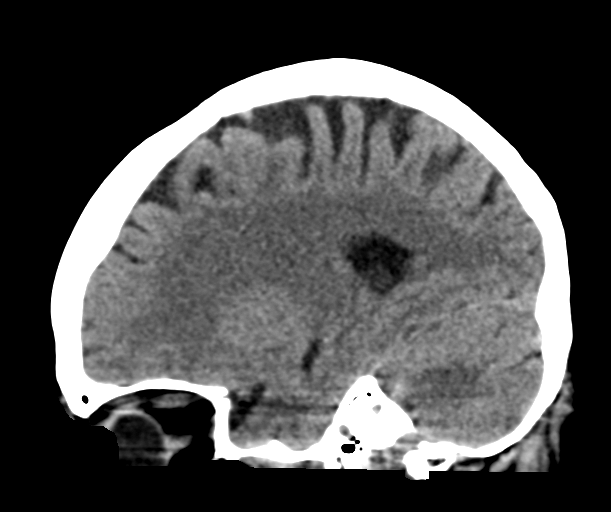
[im 25/49  brain]
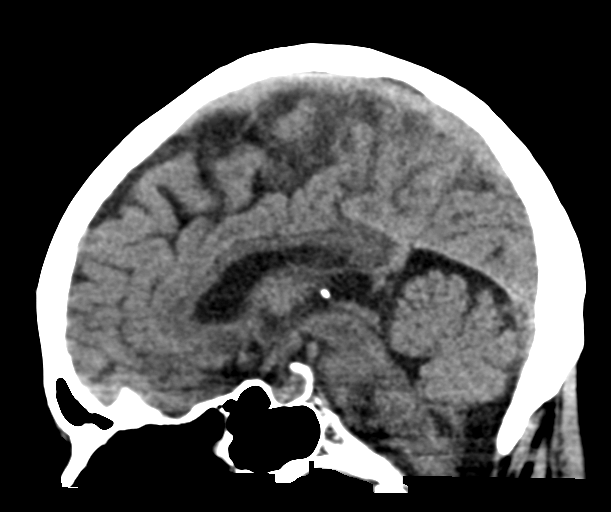
[im 33/49  brain]
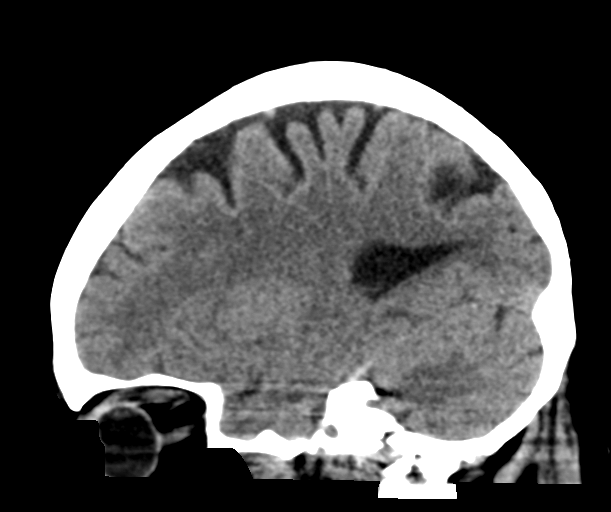

[16 of 47 positions shown; findings below may reference images not displayed]

FINDINGS: Brain: No intracranial hemorrhage, mass effect, or midline shift.
Brain volume is normal for age. No hydrocephalus. The basilar
cisterns are patent. No evidence of territorial infarct or acute
ischemia. No extra-axial or intracranial fluid collection.

Vascular: Atherosclerosis of skullbase vasculature without
hyperdense vessel or abnormal calcification.

Skull: Small sclerotic focus in the right posterior frontal [place]
aim is likely a bone island. No fracture. No destructive lesion.

Sinuses/Orbits: Paranasal sinuses and mastoid air cells are clear.
The visualized orbits are unremarkable.

Other: Known.
IMPRESSION: No acute intracranial abnormality or explanation for dizziness.

## 2021-02-27 ENCOUNTER — Other Ambulatory Visit: Payer: Self-pay | Admitting: Family Medicine

## 2021-02-27 NOTE — Telephone Encounter (Signed)
E-scribed refill 

## 2021-02-27 NOTE — Telephone Encounter (Signed)
  Encourage patient to contact the pharmacy for refills or they can request refills through [place]:  Please schedule appointment if longer than 1 year  NEXT APPOINTMENT DATE:  MEDICATION:albuterol (VENTOLIN HFA) 108 (90 Base) MCG/ACT inhale  Is the patient out of medication?   PHARMACY:[id number] [place], [place] - [address]  Let patient know to contact pharmacy at the end of the day to make sure medication is ready.  Please notify patient to allow 48-72 hours to process  CLINICAL FILLS OUT ALL BELOW:   LAST REFILL:  QTY:  REFILL DATE:    OTHER COMMENTS:    Okay for refill?  Please advise

## 2021-03-01 ENCOUNTER — Ambulatory Visit: Payer: Medicare Other

## 2021-03-04 DIAGNOSIS — J449 Chronic obstructive pulmonary disease, unspecified: Secondary | ICD-10-CM | POA: Diagnosis not present

## 2021-03-04 DIAGNOSIS — Z7409 Other reduced mobility: Secondary | ICD-10-CM | POA: Diagnosis not present

## 2021-03-04 DIAGNOSIS — J9611 Chronic respiratory failure with hypoxia: Secondary | ICD-10-CM | POA: Diagnosis not present

## 2021-03-08 DIAGNOSIS — J449 Chronic obstructive pulmonary disease, unspecified: Secondary | ICD-10-CM | POA: Diagnosis not present

## 2021-03-15 ENCOUNTER — Other Ambulatory Visit: Payer: Self-pay | Admitting: Family Medicine

## 2021-03-15 DIAGNOSIS — H01132 Eczematous dermatitis of right lower eyelid: Secondary | ICD-10-CM | POA: Diagnosis not present

## 2021-03-15 DIAGNOSIS — H2511 Age-related nuclear cataract, right eye: Secondary | ICD-10-CM | POA: Diagnosis not present

## 2021-03-15 DIAGNOSIS — H2513 Age-related nuclear cataract, bilateral: Secondary | ICD-10-CM | POA: Diagnosis not present

## 2021-03-21 ENCOUNTER — Ambulatory Visit: Payer: Medicare Other

## 2021-03-21 DIAGNOSIS — J449 Chronic obstructive pulmonary disease, unspecified: Secondary | ICD-10-CM | POA: Diagnosis not present

## 2021-03-22 ENCOUNTER — Ambulatory Visit: Payer: Medicare (Managed Care) | Admitting: Obstetrics and Gynecology

## 2021-03-27 DIAGNOSIS — H269 Unspecified cataract: Secondary | ICD-10-CM | POA: Diagnosis not present

## 2021-03-27 DIAGNOSIS — H2511 Age-related nuclear cataract, right eye: Secondary | ICD-10-CM | POA: Diagnosis not present

## 2021-03-28 DIAGNOSIS — H2511 Age-related nuclear cataract, right eye: Secondary | ICD-10-CM | POA: Diagnosis not present

## 2021-03-29 ENCOUNTER — Ambulatory Visit: Payer: Medicare Other

## 2021-04-03 DIAGNOSIS — Z7409 Other reduced mobility: Secondary | ICD-10-CM | POA: Diagnosis not present

## 2021-04-03 DIAGNOSIS — J9611 Chronic respiratory failure with hypoxia: Secondary | ICD-10-CM | POA: Diagnosis not present

## 2021-04-03 DIAGNOSIS — J449 Chronic obstructive pulmonary disease, unspecified: Secondary | ICD-10-CM | POA: Diagnosis not present

## 2021-04-04 ENCOUNTER — Ambulatory Visit: Payer: Medicare Other

## 2021-04-05 ENCOUNTER — Telehealth: Payer: Self-pay | Admitting: Student

## 2021-04-05 NOTE — Telephone Encounter (Signed)
Rec'd message that patient had called wanting to schedule a Palliative f/u visit.  Attempted to contact patient to schedule visit, no answer - left message requesting a return call.

## 2021-04-07 DIAGNOSIS — J449 Chronic obstructive pulmonary disease, unspecified: Secondary | ICD-10-CM | POA: Diagnosis not present

## 2021-04-10 ENCOUNTER — Encounter: Payer: Self-pay | Admitting: Pulmonary Disease

## 2021-04-10 DIAGNOSIS — H269 Unspecified cataract: Secondary | ICD-10-CM | POA: Diagnosis not present

## 2021-04-10 DIAGNOSIS — H2512 Age-related nuclear cataract, left eye: Secondary | ICD-10-CM | POA: Diagnosis not present

## 2021-04-13 ENCOUNTER — Encounter: Payer: Self-pay | Admitting: Pulmonary Disease

## 2021-04-21 DIAGNOSIS — J449 Chronic obstructive pulmonary disease, unspecified: Secondary | ICD-10-CM | POA: Diagnosis not present

## 2021-04-24 DIAGNOSIS — H2102 Hyphema, left eye: Secondary | ICD-10-CM | POA: Diagnosis not present

## 2021-05-04 DIAGNOSIS — Z7409 Other reduced mobility: Secondary | ICD-10-CM | POA: Diagnosis not present

## 2021-05-04 DIAGNOSIS — J9611 Chronic respiratory failure with hypoxia: Secondary | ICD-10-CM | POA: Diagnosis not present

## 2021-05-04 DIAGNOSIS — J449 Chronic obstructive pulmonary disease, unspecified: Secondary | ICD-10-CM | POA: Diagnosis not present

## 2021-05-07 ENCOUNTER — Encounter: Payer: Self-pay | Admitting: Family Medicine

## 2021-05-07 ENCOUNTER — Telehealth (INDEPENDENT_AMBULATORY_CARE_PROVIDER_SITE_OTHER): Payer: Medicare Other | Admitting: Family Medicine

## 2021-05-07 ENCOUNTER — Other Ambulatory Visit: Payer: Self-pay

## 2021-05-07 VITALS — BP 118/64 | HR 62 | Ht 64.0 in | Wt 99.1 lb

## 2021-05-07 DIAGNOSIS — F419 Anxiety disorder, unspecified: Secondary | ICD-10-CM | POA: Diagnosis not present

## 2021-05-07 DIAGNOSIS — F331 Major depressive disorder, recurrent, moderate: Secondary | ICD-10-CM

## 2021-05-07 DIAGNOSIS — K5909 Other constipation: Secondary | ICD-10-CM | POA: Diagnosis not present

## 2021-05-07 DIAGNOSIS — Z720 Tobacco use: Secondary | ICD-10-CM | POA: Diagnosis not present

## 2021-05-07 DIAGNOSIS — J449 Chronic obstructive pulmonary disease, unspecified: Secondary | ICD-10-CM | POA: Diagnosis not present

## 2021-05-07 DIAGNOSIS — M810 Age-related osteoporosis without current pathological fracture: Secondary | ICD-10-CM

## 2021-05-07 MED ORDER — FOLIC ACID 1 MG PO TABS: 1.0000 mg | ORAL_TABLET | Freq: Every day | ORAL | 1 refills | Status: DC

## 2021-05-07 MED ORDER — LINZESS 290 MCG PO CAPS: 290.0000 ug | ORAL_CAPSULE | Freq: Every day | ORAL | 1 refills | Status: DC | PRN

## 2021-05-07 MED ORDER — NAPROXEN 375 MG PO TABS: ORAL_TABLET | ORAL | 1 refills | Status: DC

## 2021-05-07 MED ORDER — BUPROPION HCL ER (XL) 150 MG PO TB24: 150.0000 mg | ORAL_TABLET | Freq: Every day | ORAL | 1 refills | Status: DC

## 2021-05-07 MED ORDER — HYDROXYZINE HCL 25 MG PO TABS: 25.0000 mg | ORAL_TABLET | Freq: Two times a day (BID) | ORAL | 6 refills | Status: DC | PRN

## 2021-05-07 MED ORDER — SERTRALINE HCL 100 MG PO TABS
150.0000 mg | ORAL_TABLET | Freq: Every day | ORAL | 1 refills | Status: DC
Start: 2021-05-07 — End: 2021-05-25

## 2021-05-07 MED ORDER — OMEPRAZOLE 40 MG PO CPDR: 40.0000 mg | DELAYED_RELEASE_CAPSULE | Freq: Every day | ORAL | 1 refills | Status: DC

## 2021-05-07 MED ORDER — ALBUTEROL SULFATE HFA 108 (90 BASE) MCG/ACT IN AERS: INHALATION_SPRAY | RESPIRATORY_TRACT | 6 refills | Status: DC

## 2021-05-07 MED ORDER — GABAPENTIN 100 MG PO CAPS: 100.0000 mg | ORAL_CAPSULE | Freq: Two times a day (BID) | ORAL | 1 refills | Status: DC

## 2021-05-07 MED ORDER — ALBUTEROL SULFATE (2.5 MG/3ML) 0.083% IN NEBU: 2.5000 mg | INHALATION_SOLUTION | Freq: Four times a day (QID) | RESPIRATORY_TRACT | 6 refills | Status: DC | PRN

## 2021-05-07 MED ORDER — MECLIZINE HCL 25 MG PO TABS
25.0000 mg | ORAL_TABLET | Freq: Three times a day (TID) | ORAL | 0 refills | Status: DC | PRN
Start: 2021-05-07 — End: 2023-02-04

## 2021-05-07 MED ORDER — MIRTAZAPINE 7.5 MG PO TABS: 7.5000 mg | ORAL_TABLET | Freq: Every day | ORAL | 1 refills | Status: DC

## 2021-05-07 MED ORDER — METHOCARBAMOL 500 MG PO TABS: ORAL_TABLET | ORAL | 3 refills | Status: DC

## 2021-05-07 MED ORDER — BENZONATATE 100 MG PO CAPS
100.0000 mg | ORAL_CAPSULE | Freq: Three times a day (TID) | ORAL | 3 refills | Status: DC | PRN
Start: 2021-05-07 — End: 2021-07-31

## 2021-05-07 NOTE — Assessment & Plan Note (Signed)
Deteriorated mood despite regularly taking regimen which includes sertralin 150mg , wellbutrin XL 150mg  and remeron 7.5mg  nightly.  She is looking forward to training new german shepherd puppy and hopes this will help improve mood. Consider counseling if no improvement noted.

## 2021-05-07 NOTE — Assessment & Plan Note (Signed)
Continues PRN linzess with benefit.

## 2021-05-07 NOTE — Assessment & Plan Note (Addendum)
Discussed overdue for prolia.  Has missed many nurse visits for prolia. Pt states it's because she forgets when she has scheduled her appointments.  I will ask [name] to reschedule nurse visit appointment at [facility] (closer for patient).

## 2021-05-07 NOTE — Telephone Encounter (Signed)
See VV note from Dr Darnell Level from 05/07/21. Patient has missed multiple nurse visits appointments. Overdue for Prolia injection. Called and left detailed message for patient to call back and schedule nurse visit for [place] station as soon as possible.

## 2021-05-07 NOTE — Assessment & Plan Note (Addendum)
Reviewed breathing regimen including Breztri 2 puffs BID and PRN albuterol inh/neb.  She will call pulm to request prednisone refill.  Reviewed how albuterol loses effect the more it's used.

## 2021-05-07 NOTE — Assessment & Plan Note (Addendum)
Continues hydroxyzine PRN. Reviewed sedation precaution.

## 2021-05-07 NOTE — Assessment & Plan Note (Signed)
Continued smoker. Precontemplative.  

## 2021-05-07 NOTE — Progress Notes (Signed)
Patient ID: Christine Serrano, female    DOB: [birth year], 60 y.o.   MRN: [id number]  Virtual visit completed through [employer], a video enabled telemedicine application. Due to national recommendations of social distancing due to COVID-19, a virtual visit is felt to be most appropriate for this patient at this time. Reviewed limitations, risks, security and privacy concerns of performing a virtual visit and the availability of in person appointments. I also reviewed that there may be a patient responsible charge related to this service. The patient agreed to proceed.   Patient location: home Provider location: [place] at [facility], office Persons participating in this virtual visit: patient, provider  If any vitals were documented, they were collected by patient at home unless specified below.    BP 118/64   Pulse 62   Ht 5\' 4"  (1.626 m)   Wt 99 lb 2 oz (45 kg)   LMP  (LMP Unknown)   BMI 17.01 kg/m    CC: med refill Subjective:   HPI: Christine Serrano is a 60 y.o. female presenting on 05/07/2021 for Medication Refill (Pt is requesting everything prescribed by Dr. Darnell Level be refilled. )   Patient was scheduled for in office visit today however this was changed to virtual - she was afraid to come to further office worried portal oxygen would run out. Planning to get new O2 tank this week.   Multiple missed nurse visits for prolia injection - overdue as last done 08/01/2020.   Depression - on sertraline 150mg  daily, wellbutrin XL 150mg  daily, and remeron 7.5mg  nightly. Planning to get new german sheperd puppy to train - will receive in 2-3 wks.   S/p cataract surgery bilaterally 2 months ago.  Overdue for Palliative care home visit - she will call to reschedule.      Relevant past medical, surgical, family and social history reviewed and updated as indicated. Interim medical history since our last visit reviewed. Allergies and medications reviewed and updated. Outpatient Medications  Prior to Visit  Medication Sig Dispense Refill   Ascorbic Acid (VITAMIN C WITH ROSE HIPS) 500 MG tablet Take 500 mg by mouth daily.     aspirin EC 81 MG tablet Take 81 mg by mouth daily.     Budeson-Glycopyrrol-Formoterol (BREZTRI AEROSPHERE) 160-9-4.8 MCG/ACT AERO Inhale 2 puffs into the lungs in the morning and at bedtime. 32.1 g 3   cholecalciferol (VITAMIN D3) 25 MCG (1000 UNIT) tablet Take 1,000 Units by mouth daily.     doxycycline (VIBRA-TABS) 100 MG tablet Take 1 tablet (100 mg total) by mouth 2 (two) times daily. 14 tablet 0   fluticasone (FLONASE) 50 MCG/ACT nasal spray Place 2 sprays into both nostrils daily. 48 g 3   Multiple Vitamin (MULTIVITAMIN WITH MINERALS) TABS tablet Take 1 tablet by mouth daily.     OXYGEN Inhale 4 L/min into the lungs continuous.      polyethylene glycol powder (GLYCOLAX/MIRALAX) 17 GM/SCOOP powder Take 17 g by mouth daily as needed for moderate constipation. 3350 g 1   predniSONE (DELTASONE) 20 MG tablet Take 2 tablets (40 mg total) by mouth daily. For 5 days, then 1 tab daily for 5 days. 15 tablet 0   predniSONE (DELTASONE) 5 MG tablet TAKE 2 TABLETS DAILY WITH BREAKFAST OR AS DIRECTED 60 tablet 3   thiamine 100 MG tablet Take 1 tablet (100 mg total) by mouth daily. 30 tablet 0   vitamin B-12 (CYANOCOBALAMIN) 1000 MCG tablet Take 1 tablet (1,000 mcg total)  by mouth daily.     albuterol (PROVENTIL) (2.5 MG/3ML) 0.083% nebulizer solution Take 3 mLs (2.5 mg total) by nebulization every 6 (six) hours as needed for wheezing or shortness of breath. 150 mL 6   albuterol (VENTOLIN HFA) 108 (90 Base) MCG/ACT inhaler USE 2 INHALATIONS BY MOUTH  EVERY 6 HOURS AS NEEDED FOR WHEEZING OR SHORTNESS OF  BREATH 34 g 3   benzonatate (TESSALON) 100 MG capsule Take 1 capsule (100 mg total) by mouth 3 (three) times daily as needed for cough. 30 capsule 11   buPROPion (WELLBUTRIN XL) 150 MG 24 hr tablet Take 1 tablet (150 mg total) by mouth daily. 90 tablet 3   folic acid  (FOLVITE) 1 MG tablet Take 1 tablet (1 mg total) by mouth daily. 90 tablet 3   gabapentin (NEURONTIN) 100 MG capsule Take 1 capsule (100 mg total) by mouth 2 (two) times daily. 180 capsule 3   hydrOXYzine (ATARAX/VISTARIL) 25 MG tablet TAKE 1 TABLET BY MOUTH IN  THE MORNING AND AT BEDTIME 180 tablet 1   LINZESS 290 MCG CAPS capsule TAKE 1 CAPSULE EVERY DAY AS NEEDED FOR CONSTIPATION AS DIRECTED 90 capsule 3   meclizine (ANTIVERT) 25 MG tablet Take 1 tablet (25 mg total) by mouth 3 (three) times daily as needed for dizziness. 30 tablet 0   methocarbamol (ROBAXIN) 500 MG tablet TAKE 1 TABLET BY MOUTH 3 TIMES DAILY AS NEEDED FOR MUSCLE SPASMS (SEDATION PRECAUTIONS). 90 tablet 0   mirtazapine (REMERON) 7.5 MG tablet Take 1 tablet (7.5 mg total) by mouth at bedtime. 30 tablet 3   naproxen (NAPROSYN) 375 MG tablet TAKE 1 TABLET BY MOUTH TWICE A DAY AS NEEDED FOR PAIN WITH FOOD 40 tablet 3   omeprazole (PRILOSEC) 40 MG capsule TAKE 1 CAPSULE BY MOUTH DAILY. FOR THREE WEEKS THEN AS NEEDED. TAKE 30 MIN PRIOR TO LARGE MEAL 90 capsule 1   sertraline (ZOLOFT) 100 MG tablet TAKE 1 AND 1/2 TABLETS BY MOUTH EVERY DAY 135 tablet 1   No facility-administered medications prior to visit.     Per HPI unless specifically indicated in ROS section below Review of Systems Objective:  BP 118/64   Pulse 62   Ht 5\' 4"  (1.626 m)   Wt 99 lb 2 oz (45 kg)   LMP  (LMP Unknown)   BMI 17.01 kg/m   Wt Readings from Last 3 Encounters:  05/07/21 99 lb 2 oz (45 kg)  02/16/21 97 lb 8 oz (44.2 kg)  11/22/20 94 lb 12.8 oz (43 kg)       Physical exam: Gen: alert, NAD, not ill appearing smoking cigarette during video visit.  Pulm: speaks in complete sentences without increased work of breathing Psych: normal mood, normal thought content      Results for orders placed or performed in visit on 02/16/21  CBC with Differential/Platelet  Result Value Ref Range   WBC 5.3 3.8 - 10.8 Thousand/uL   RBC 4.02 3.80 - 5.10  Million/uL   Hemoglobin 10.9 (L) 11.7 - 15.5 g/dL   HCT 34.0 (L) 35.0 - 45.0 %   MCV 84.6 80.0 - 100.0 fL   MCH 27.1 27.0 - 33.0 pg   MCHC 32.1 32.0 - 36.0 g/dL   RDW 13.9 11.0 - 15.0 %   Platelets 212 140 - 400 Thousand/uL   MPV 11.1 7.5 - 12.5 fL   Neutro Abs 4,362 1,500 - 7,800 cells/uL   Lymphs Abs 721 (L) 850 - 3,900 cells/uL  Absolute Monocytes 148 (L) 200 - 950 cells/uL   Eosinophils Absolute 21 15 - 500 cells/uL   Basophils Absolute 48 0 - 200 cells/uL   Neutrophils Relative % 82.3 %   Total Lymphocyte 13.6 %   Monocytes Relative 2.8 %   Eosinophils Relative 0.4 %   Basophils Relative 0.9 %  Comprehensive metabolic panel  Result Value Ref Range   Glucose, Bld 93 65 - 99 mg/dL   BUN 9 7 - 25 mg/dL   Creat 0.58 0.50 - 1.05 mg/dL   BUN/Creatinine Ratio NOT APPLICABLE 6 - 22 (calc)   Sodium 141 135 - 146 mmol/L   Potassium 3.7 3.5 - 5.3 mmol/L   Chloride 99 98 - 110 mmol/L   CO2 36 (H) 20 - 32 mmol/L   Calcium 8.9 8.6 - 10.4 mg/dL   Total Protein 5.7 (L) 6.1 - 8.1 g/dL   Albumin 3.6 3.6 - 5.1 g/dL   Globulin 2.1 1.9 - 3.7 g/dL (calc)   AG Ratio 1.7 1.0 - 2.5 (calc)   Total Bilirubin 0.3 0.2 - 1.2 mg/dL   Alkaline phosphatase (APISO) 114 37 - 153 U/L   AST 30 10 - 35 U/L   ALT 18 6 - 29 U/L   Assessment & Plan:   Problem List Items Addressed This Visit     Moderate recurrent major depression (HCC)    Deteriorated mood despite regularly taking regimen which includes sertralin 150mg , wellbutrin XL 150mg  and remeron 7.5mg  nightly.  She is looking forward to training new german shepherd puppy and hopes this will help improve mood. Consider counseling if no improvement noted.       Relevant Medications   sertraline (ZOLOFT) 100 MG tablet   buPROPion (WELLBUTRIN XL) 150 MG 24 hr tablet   hydrOXYzine (ATARAX/VISTARIL) 25 MG tablet   mirtazapine (REMERON) 7.5 MG tablet   Chronic constipation    Continues PRN linzess with benefit.       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  She will call pulm to request prednisone refill.  Reviewed how albuterol loses effect the more it's used.       Relevant Medications   albuterol (PROVENTIL) (2.5 MG/3ML) 0.083% nebulizer solution   albuterol (VENTOLIN HFA) 108 (90 Base) MCG/ACT inhaler   benzonatate (TESSALON) 100 MG capsule   Tobacco abuse    Continued smoker.  Precontemplative.       Anxiety    Continues hydroxyzine PRN. Reviewed sedation precaution.       Relevant Medications   sertraline (ZOLOFT) 100 MG tablet   buPROPion (WELLBUTRIN XL) 150 MG 24 hr tablet   hydrOXYzine (ATARAX/VISTARIL) 25 MG tablet   mirtazapine (REMERON) 7.5 MG tablet   Osteoporosis    Discussed overdue for prolia.  Has missed many nurse visits for prolia. Pt states it's because she forgets when she has scheduled her appointments.  I will ask [name] to reschedule nurse visit appointment at [facility] (closer for patient).         Meds ordered this encounter  Medications   albuterol (PROVENTIL) (2.5 MG/3ML) 0.083% nebulizer solution    Sig: Take 3 mLs (2.5 mg total) by nebulization every 6 (six) hours as needed for wheezing or shortness of breath.    Dispense:  150 mL    Refill:  6   sertraline (ZOLOFT) 100 MG tablet    Sig: Take 1.5 tablets (150 mg total) by mouth  daily.    Dispense:  135 tablet    Refill:  1   albuterol (VENTOLIN HFA) 108 (90 Base) MCG/ACT inhaler    Sig: USE 2 INHALATIONS BY MOUTH  EVERY 6 HOURS AS NEEDED FOR WHEEZING OR SHORTNESS OF  BREATH    Dispense:  18 g    Refill:  6   benzonatate (TESSALON) 100 MG capsule    Sig: Take 1 capsule (100 mg total) by mouth 3 (three) times daily as needed for cough.    Dispense:  30 capsule    Refill:  3   buPROPion (WELLBUTRIN XL) 150 MG 24 hr tablet    Sig: Take 1 tablet (150 mg total) by mouth daily.    Dispense:  90 tablet    Refill:  1   folic acid (FOLVITE) 1 MG  tablet    Sig: Take 1 tablet (1 mg total) by mouth daily.    Dispense:  90 tablet    Refill:  1   gabapentin (NEURONTIN) 100 MG capsule    Sig: Take 1 capsule (100 mg total) by mouth 2 (two) times daily.    Dispense:  180 capsule    Refill:  1   hydrOXYzine (ATARAX/VISTARIL) 25 MG tablet    Sig: Take 1 tablet (25 mg total) by mouth 2 (two) times daily as needed for anxiety.    Dispense:  60 tablet    Refill:  6   LINZESS 290 MCG CAPS capsule    Sig: Take 1 capsule (290 mcg total) by mouth daily as needed (constipation). TAKE 1 CAPSULE EVERY DAY AS NEEDED FOR CONSTIPATION AS DIRECTED    Dispense:  90 capsule    Refill:  1   meclizine (ANTIVERT) 25 MG tablet    Sig: Take 1 tablet (25 mg total) by mouth 3 (three) times daily as needed for dizziness.    Dispense:  30 tablet    Refill:  0   methocarbamol (ROBAXIN) 500 MG tablet    Sig: TAKE 1 TABLET BY MOUTH 3 TIMES DAILY AS NEEDED FOR MUSCLE SPASMS (SEDATION PRECAUTIONS).    Dispense:  40 tablet    Refill:  3   mirtazapine (REMERON) 7.5 MG tablet    Sig: Take 1 tablet (7.5 mg total) by mouth at bedtime.    Dispense:  90 tablet    Refill:  1   naproxen (NAPROSYN) 375 MG tablet    Sig: TAKE 1 TABLET BY MOUTH TWICE A DAY AS NEEDED FOR PAIN WITH FOOD    Dispense:  30 tablet    Refill:  1   omeprazole (PRILOSEC) 40 MG capsule    Sig: Take 1 capsule (40 mg total) by mouth daily.    Dispense:  90 capsule    Refill:  1    No orders of the defined types were placed in this encounter.   I discussed the assessment and treatment plan with the patient. The patient was provided an opportunity to ask questions and all were answered. The patient agreed with the plan and demonstrated an understanding of the instructions. The patient was advised to call back or seek an in-person evaluation if the symptoms worsen or if the condition fails to improve as anticipated.  Follow up plan: No follow-ups on file.  Ria Bush, MD

## 2021-05-08 DIAGNOSIS — J449 Chronic obstructive pulmonary disease, unspecified: Secondary | ICD-10-CM | POA: Diagnosis not present

## 2021-05-13 ENCOUNTER — Other Ambulatory Visit: Payer: Self-pay

## 2021-05-13 ENCOUNTER — Encounter (HOSPITAL_COMMUNITY): Payer: Self-pay | Admitting: Emergency Medicine

## 2021-05-13 ENCOUNTER — Emergency Department (HOSPITAL_COMMUNITY)
Admission: EM | Admit: 2021-05-13 | Discharge: 2021-05-14 | Disposition: A | Discharge status: Home / Self Care | Payer: Medicare Other | Attending: Emergency Medicine | Admitting: Emergency Medicine

## 2021-05-13 ENCOUNTER — Emergency Department (HOSPITAL_COMMUNITY): Payer: Medicare Other

## 2021-05-13 DIAGNOSIS — Z79899 Other long term (current) drug therapy: Secondary | ICD-10-CM | POA: Diagnosis not present

## 2021-05-13 DIAGNOSIS — J441 Chronic obstructive pulmonary disease with (acute) exacerbation: Secondary | ICD-10-CM | POA: Diagnosis not present

## 2021-05-13 DIAGNOSIS — R059 Cough, unspecified: Secondary | ICD-10-CM | POA: Diagnosis not present

## 2021-05-13 DIAGNOSIS — J439 Emphysema, unspecified: Secondary | ICD-10-CM | POA: Diagnosis not present

## 2021-05-13 DIAGNOSIS — F17210 Nicotine dependence, cigarettes, uncomplicated: Secondary | ICD-10-CM | POA: Diagnosis not present

## 2021-05-13 DIAGNOSIS — Z20822 Contact with and (suspected) exposure to covid-19: Secondary | ICD-10-CM | POA: Insufficient documentation

## 2021-05-13 DIAGNOSIS — R0602 Shortness of breath: Secondary | ICD-10-CM | POA: Diagnosis not present

## 2021-05-13 DIAGNOSIS — Z28310 Unvaccinated for covid-19: Secondary | ICD-10-CM | POA: Insufficient documentation

## 2021-05-13 DIAGNOSIS — Z85038 Personal history of other malignant neoplasm of large intestine: Secondary | ICD-10-CM | POA: Insufficient documentation

## 2021-05-13 DIAGNOSIS — Z8541 Personal history of malignant neoplasm of cervix uteri: Secondary | ICD-10-CM | POA: Diagnosis not present

## 2021-05-13 DIAGNOSIS — R0689 Other abnormalities of breathing: Secondary | ICD-10-CM | POA: Diagnosis not present

## 2021-05-13 DIAGNOSIS — J189 Pneumonia, unspecified organism: Secondary | ICD-10-CM | POA: Insufficient documentation

## 2021-05-13 DIAGNOSIS — Z7982 Long term (current) use of aspirin: Secondary | ICD-10-CM | POA: Insufficient documentation

## 2021-05-13 DIAGNOSIS — R079 Chest pain, unspecified: Secondary | ICD-10-CM

## 2021-05-13 DIAGNOSIS — Z7951 Long term (current) use of inhaled steroids: Secondary | ICD-10-CM | POA: Insufficient documentation

## 2021-05-13 DIAGNOSIS — Z743 Need for continuous supervision: Secondary | ICD-10-CM | POA: Diagnosis not present

## 2021-05-13 DIAGNOSIS — R5383 Other fatigue: Secondary | ICD-10-CM | POA: Diagnosis not present

## 2021-05-13 LAB — BASIC METABOLIC PANEL
Anion gap: 8 (ref 5–15)
BUN: 7 mg/dL — ABNORMAL LOW (ref 8–23)
CO2: 32 mmol/L (ref 22–32)
Calcium: 8.4 mg/dL — ABNORMAL LOW (ref 8.9–10.3)
Chloride: 96 mmol/L — ABNORMAL LOW (ref 98–111)
Creatinine, Ser: 0.6 mg/dL (ref 0.44–1.00)
GFR, Estimated: >60 mL/min (ref >60)
Glucose, Bld: 121 mg/dL — ABNORMAL HIGH (ref 70–99)
Potassium: 3.3 mmol/L — ABNORMAL LOW (ref 3.5–5.1)
Sodium: 136 mmol/L (ref 135–145)

## 2021-05-13 LAB — CBC
HCT: 30.8 % — ABNORMAL LOW (ref 36.0–46.0)
Hemoglobin: 10 g/dL — ABNORMAL LOW (ref 12.0–15.0)
MCH: 27 pg (ref 26.0–34.0)
MCHC: 32.5 g/dL (ref 30.0–36.0)
MCV: 83 fL (ref 80.0–100.0)
Platelets: 194 10*3/uL (ref 150–400)
RBC: 3.71 MIL/uL — ABNORMAL LOW (ref 3.87–5.11)
RDW: 15.7 % — ABNORMAL HIGH (ref 11.5–15.5)
WBC: 5.6 10*3/uL (ref 4.0–10.5)
nRBC: 0 % (ref 0.0–0.2)

## 2021-05-13 LAB — TROPONIN I (HIGH SENSITIVITY): Troponin I (High Sensitivity): 4 ng/L (ref <18)

## 2021-05-13 MED ORDER — ALBUTEROL (5 MG/ML) CONTINUOUS INHALATION SOLN: 10.0000 mg/h | INHALATION_SOLUTION | Freq: Once | RESPIRATORY_TRACT | Status: DC

## 2021-05-13 MED ORDER — METHYLPREDNISOLONE SODIUM SUCC 125 MG IJ SOLR: 125.0000 mg | Freq: Once | INTRAMUSCULAR | Status: DC

## 2021-05-13 MED ORDER — ALBUTEROL SULFATE (2.5 MG/3ML) 0.083% IN NEBU
10.0000 mg | INHALATION_SOLUTION | Freq: Once | RESPIRATORY_TRACT | Status: AC
  Administered 2021-05-13: 22:00:00 10 mg via RESPIRATORY_TRACT

## 2021-05-13 MED ORDER — AMOXICILLIN-POT CLAVULANATE 875-125 MG PO TABS
1.0000 | ORAL_TABLET | Freq: Once | ORAL | Status: AC
  Administered 2021-05-14: 1 via ORAL
  Filled 2021-05-13: qty 1

## 2021-05-13 MED ORDER — ASPIRIN 81 MG PO CHEW
324.0000 mg | CHEWABLE_TABLET | Freq: Once | ORAL | Status: AC
  Administered 2021-05-13: 22:00:00 324 mg via ORAL
  Filled 2021-05-13: qty 4

## 2021-05-13 MED ORDER — ALBUTEROL SULFATE HFA 108 (90 BASE) MCG/ACT IN AERS: 2.0000 | INHALATION_SPRAY | RESPIRATORY_TRACT | Status: DC | PRN

## 2021-05-13 MED ORDER — SODIUM CHLORIDE 0.9 % IV BOLUS
500.0000 mL | Freq: Once | INTRAVENOUS | Status: AC
  Administered 2021-05-13: 22:00:00 500 mL via INTRAVENOUS

## 2021-05-13 MED ORDER — MAGNESIUM SULFATE 2 GM/50ML IV SOLN
2.0000 g | Freq: Once | INTRAVENOUS | Status: AC
  Administered 2021-05-13: 22:00:00 2 g via INTRAVENOUS
  Filled 2021-05-13: qty 50

## 2021-05-13 MED ORDER — ALBUTEROL SULFATE (2.5 MG/3ML) 0.083% IN NEBU
INHALATION_SOLUTION | RESPIRATORY_TRACT | Status: AC
  Filled 2021-05-13: qty 12

## 2021-05-13 NOTE — ED Provider Notes (Signed)
[facility] DEPT Provider Note   CSN: [id number] Arrival date & time: 05/13/21  2114     History Chief Complaint  Patient presents with   Shortness of Breath         Christine Serrano is a 60 y.o. female.  HPI She presents for evaluation of shortness of breath and chest pain which started about 2 hours ago.  She is on 4 L of oxygen at baseline, and was transferred here by EMS.  She feels like her breathing trouble is worse with exertion today.  She is using her home nebulizer and other medications without relief.  She has not had influenza or COVID vaccines.  There are no other known active modifying factors.    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[place]) 11/22/2020   Elbow laceration, right, initial encounter 10/24/2020   Arm injury, right, initial encounter 10/24/2020   Fall with injury 10/24/2020   Traumatic hematoma of scalp 10/24/2020   MRSA infection 08/23/2020   Dyspnea on exertion 08/23/2020   Abscess of axilla, left 08/14/2020   Low serum vitamin B12 06/09/2020   Cervical neck pain with evidence of disc disease 01/29/2020   Rhomboid muscle strain, subsequent encounter 11/10/2019   Palliative care status 10/23/2019   Impaired mobility 10/20/2019   Chronic respiratory failure with hypoxia, on home O2 therapy ([place]) 10/20/2019   Memory difficulties 10/16/2019   Folate deficiency 09/17/2019   Dizziness 09/11/2019   Anemia 09/11/2019   Hypocalcemia 63/84/6659   Systolic murmur 93/57/0177   Vertebral artery stenosis, left 02/27/2019   Medicare annual wellness visit,  subsequent 02/25/2019   Advanced directives, counseling/discussion 02/25/2019   Epistaxis 09/21/2018   Osteoporosis 05/23/2018   Watery diarrhea 05/08/2018   Numbness of right hand 10/10/2017   Benign neoplasm of ascending colon    Benign neoplasm of transverse colon    Benign neoplasm of sigmoid colon    Positive hepatitis C antibody test 02/23/2017   Encounter for general adult medical examination with abnormal findings 02/21/2017   HLD (hyperlipidemia) 02/21/2017   Anxiety 12/10/2016   Tobacco abuse 11/28/2016   Aortic atherosclerosis ([place]) 11/26/2016   Tachycardia 11/01/2016   Pulmonary nodule 10/25/2016   Moderate recurrent major depression ([place]) 07/03/2016   Chronic constipation 07/03/2016   Alcohol abuse, in remission 07/03/2016   COPD, very severe ([place]) 07/03/2016   Tremor 07/03/2016   Chronic insomnia 07/03/2016    Past Surgical History:  Procedure Laterality Date   ABLATION ON ENDOMETRIOSIS     APPENDECTOMY     BREAST EXCISIONAL BIOPSY Left age 79   benign biopsy - chronic fatty deposit   COLONOSCOPY WITH PROPOFOL N/A 08/04/2017   TAx3, angiodysplastic lesion, diverticulosis, rpt 5 yrs ([place])   EXPLORATORY LAPAROTOMY  1992   endometriosis   FOOT SURGERY Right    needle imbeded   LASER ABLATION CONDYLOMA CERVICAL / VULVAR     NASAL SEPTUM SURGERY Bilateral    OVARIAN CYST SURGERY Bilateral 1980   remote   SALPINGOOPHORECTOMY Right 1983   ectopic pregnancy   TONSILLECTOMY  OB History   No obstetric history on file.     Family History  Problem Relation Age of Onset   Heart disease Mother    Hypertension Mother    Hypertension Father    COPD Father    Hearing loss Maternal Grandmother    Hypertension Maternal Grandmother    Hearing loss Maternal Grandfather    Hypertension Maternal Grandfather    Colon cancer Paternal Grandmother     Social History   Tobacco Use   Smoking status: Every Day    Packs/day: 1.00    Years: 51.00    Pack  years: 51.00    Types: Cigarettes   Smokeless tobacco: Never   Tobacco comments:    1PPD 11/22/2020  Vaping Use   Vaping Use: Never used  Substance Use Topics   Alcohol use: Yes    Comment: occasional   Drug use: No    Home Medications Prior to Admission medications   Medication Sig Start Date End Date Taking? Authorizing Provider  amoxicillin-clavulanate (AUGMENTIN) 875-125 MG tablet Take 1 tablet by mouth 2 (two) times daily. One po bid x 7 days 05/14/21  Yes Daleen Bo, MD  predniSONE (DELTASONE) 10 MG tablet Take q day 6,5,4,3,2,1 05/14/21  Yes Daleen Bo, MD  albuterol (PROVENTIL) (2.5 MG/3ML) 0.083% nebulizer solution Take 3 mLs (2.5 mg total) by nebulization every 6 (six) hours as needed for wheezing or shortness of breath. 05/07/21   Ria Bush, MD  albuterol (VENTOLIN HFA) 108 (90 Base) MCG/ACT inhaler USE 2 INHALATIONS BY MOUTH  EVERY 6 HOURS AS NEEDED FOR WHEEZING OR SHORTNESS OF  BREATH 05/07/21   Ria Bush, MD  Ascorbic Acid (VITAMIN C WITH ROSE HIPS) 500 MG tablet Take 500 mg by mouth daily.    [provider]  aspirin EC 81 MG tablet Take 81 mg by mouth daily.    [provider]  benzonatate (TESSALON) 100 MG capsule Take 1 capsule (100 mg total) by mouth 3 (three) times daily as needed for cough. 05/07/21   Ria Bush, MD  Budeson-Glycopyrrol-Formoterol (BREZTRI AEROSPHERE) 160-9-4.8 MCG/ACT AERO Inhale 2 puffs into the lungs in the morning and at bedtime. 12/13/20   Tyler Pita, MD  buPROPion (WELLBUTRIN XL) 150 MG 24 hr tablet Take 1 tablet (150 mg total) by mouth daily. 05/07/21   Ria Bush, MD  cholecalciferol (VITAMIN D3) 25 MCG (1000 UNIT) tablet Take 1,000 Units by mouth daily.    [provider]  doxycycline (VIBRA-TABS) 100 MG tablet Take 1 tablet (100 mg total) by mouth 2 (two) times daily. 11/15/20   Venia Carbon, MD  fluticasone (FLONASE) 50 MCG/ACT nasal spray Place 2 sprays into both  nostrils daily. 06/06/20   Ria Bush, MD  folic acid (FOLVITE) 1 MG tablet Take 1 tablet (1 mg total) by mouth daily. 05/07/21   Ria Bush, MD  gabapentin (NEURONTIN) 100 MG capsule Take 1 capsule (100 mg total) by mouth 2 (two) times daily. 05/07/21   Ria Bush, MD  hydrOXYzine (ATARAX/VISTARIL) 25 MG tablet Take 1 tablet (25 mg total) by mouth 2 (two) times daily as needed for anxiety. 05/07/21   Ria Bush, MD  LINZESS 290 MCG CAPS capsule Take 1 capsule (290 mcg total) by mouth daily as needed (constipation). TAKE 1 CAPSULE EVERY DAY AS NEEDED FOR CONSTIPATION AS DIRECTED 05/07/21   Ria Bush, MD  meclizine (ANTIVERT) 25 MG tablet Take 1 tablet (25 mg total) by mouth 3 (three) times daily as needed for  dizziness. 05/07/21   Ria Bush, MD  methocarbamol (ROBAXIN) 500 MG tablet TAKE 1 TABLET BY MOUTH 3 TIMES DAILY AS NEEDED FOR MUSCLE SPASMS (SEDATION PRECAUTIONS). 05/07/21   Ria Bush, MD  mirtazapine (REMERON) 7.5 MG tablet Take 1 tablet (7.5 mg total) by mouth at bedtime. 05/07/21   Ria Bush, MD  Multiple Vitamin (MULTIVITAMIN WITH MINERALS) TABS tablet Take 1 tablet by mouth daily. 09/12/19   Aline August, MD  naproxen (NAPROSYN) 375 MG tablet TAKE 1 TABLET BY MOUTH TWICE A DAY AS NEEDED FOR PAIN WITH FOOD 05/07/21   Ria Bush, MD  omeprazole (PRILOSEC) 40 MG capsule Take 1 capsule (40 mg total) by mouth daily. 05/07/21   Ria Bush, MD  OXYGEN Inhale 4 L/min into the lungs continuous.     [provider]  polyethylene glycol powder (GLYCOLAX/MIRALAX) 17 GM/SCOOP powder Take 17 g by mouth daily as needed for moderate constipation. 03/13/20   Ria Bush, MD  sertraline (ZOLOFT) 100 MG tablet Take 1.5 tablets (150 mg total) by mouth daily. 05/07/21   Ria Bush, MD  thiamine 100 MG tablet Take 1 tablet (100 mg total) by mouth daily. 09/12/19   Aline August, MD  vitamin B-12 (CYANOCOBALAMIN) 1000  MCG tablet Take 1 tablet (1,000 mcg total) by mouth daily. 06/06/20   Ria Bush, MD    Allergies    Terbinafine and related, Clarithromycin, and Imitrex [sumatriptan]  Review of Systems   Review of Systems  All other systems reviewed and are negative.  Physical Exam Updated Vital Signs BP 101/71 (BP Location: Right Arm)   Pulse (!) 103   Temp 97.7 F (36.5 C) (Oral)   Resp 20   Ht 5\' 4"  (1.626 m)   Wt 44.9 kg   LMP  (LMP Unknown)   SpO2 97%   BMI 16.99 kg/m   Physical Exam Vitals and nursing note reviewed.  Constitutional:      Appearance: She is well-developed. She is not ill-appearing.  HENT:     Head: Normocephalic and atraumatic.     Right Ear: External ear normal.     Left Ear: External ear normal.  Eyes:     Conjunctiva/sclera: Conjunctivae normal.     Pupils: Pupils are equal, round, and reactive to light.  Neck:     Trachea: Phonation normal.  Cardiovascular:     Rate and Rhythm: Normal rate and regular rhythm.     Heart sounds: Normal heart sounds.  Pulmonary:     Effort: Pulmonary effort is normal. No respiratory distress.     Breath sounds: No stridor. Wheezing and rhonchi present.     Comments: Decreased airflow bilaterally with scattered rhonchi, and wheezing. Abdominal:     Palpations: Abdomen is soft.     Tenderness: There is no abdominal tenderness.  Musculoskeletal:        General: Normal range of motion.     Cervical back: Normal range of motion and neck supple.  Skin:    General: Skin is warm and dry.  Neurological:     Mental Status: She is alert and oriented to person, place, and time.     Cranial Nerves: No cranial nerve deficit.     Sensory: No sensory deficit.     Motor: No abnormal muscle tone.     Coordination: Coordination normal.  Psychiatric:        Behavior: Behavior normal.        Thought Content: Thought content normal.  Judgment: Judgment normal.    ED Results / Procedures / Treatments   Labs (all labs  ordered are listed, but only abnormal results are displayed) Labs Reviewed  BASIC METABOLIC PANEL - Abnormal; Notable for the following components:      Result Value   Potassium 3.3 (*)    Chloride 96 (*)    Glucose, Bld 121 (*)    BUN 7 (*)    Calcium 8.4 (*)    All other components within normal limits  CBC - Abnormal; Notable for the following components:   RBC 3.71 (*)    Hemoglobin 10.0 (*)    HCT 30.8 (*)    RDW 15.7 (*)    All other components within normal limits  RESP PANEL BY RT-PCR (FLU A&B, COVID) ARPGX2  TROPONIN I (HIGH SENSITIVITY)  TROPONIN I (HIGH SENSITIVITY)    EKG EKG Interpretation  Date/Time:  Sunday May 13 2021 21:23:14 EST Ventricular Rate:  107 PR Interval:  158 QRS Duration: 104 QT Interval:  383 QTC Calculation: 511 R Axis:   88 Text Interpretation: Sinus tachycardia Biatrial enlargement Consider RVH w/ secondary repol abnormality ST elevation, consider inferior injury Prolonged QT interval Since last tracing QT has lengthened Otherwise no significant change Confirmed by Daleen Bo [phone]) on 05/13/2021 10:48:42 PM  Radiology DG Chest Port 1 View  Result Date: 05/13/2021 CLINICAL DATA:  Pt has hx of COPD and is at 4L of oxygen at baseline. Pt was unable to walk after exertion today. Smoker. EXAM: PORTABLE CHEST 1 VIEW COMPARISON:  Pet CT 06/30/2020, chest x-ray 08/23/2020 FINDINGS: The heart and mediastinal contours are unchanged. Aortic arch calcification. Hyperinflation of the lungs. Persistent airspace opacities within the left upper peripheral lung zone and right mid upper lung zone that are slightly more conspicuous compared to prior. No pulmonary edema. No pleural effusion. No pneumothorax. No acute osseous abnormality. IMPRESSION: 1. Persistent airspace opacities within the left upper peripheral lung zone and right mid upper lung zone that are slightly more conspicuous compared to prior. Superimposed infection/inflammation not excluded.  These regions are evaluated on PET CT 06/30/2020. Followup PA and lateral chest X-ray is recommended in 3-4 weeks following therapy to ensure resolution and exclude underlying malignancy. 2. Aortic Atherosclerosis (ICD10-I70.0) and Emphysema (ICD10-J43.9). Electronically Signed   By: Iven Finn M.D.   On: 05/13/2021 22:15    Procedures .Critical Care Performed by: Daleen Bo, MD Authorized by: Daleen Bo, MD   Critical care provider statement:    Critical care time (minutes):  35   Critical care start time:  05/14/2021 9:40 PM   Critical care end time:  05/14/2021 12:09 AM   Critical care time was exclusive of:  Separately billable procedures and treating other patients   Critical care was necessary to treat or prevent imminent or life-threatening deterioration of the following conditions:  Respiratory failure   Critical care was time spent personally by me on the following activities:  Blood draw for specimens, development of treatment plan with patient or surrogate, discussions with consultants, evaluation of patient's response to treatment, examination of patient, ordering and performing treatments and interventions, ordering and review of laboratory studies, ordering and review of radiographic studies, pulse oximetry, re-evaluation of patient's condition and review of old charts   Medications Ordered in ED Medications  albuterol (VENTOLIN HFA) 108 (90 Base) MCG/ACT inhaler 2 puff (has no administration in time range)  albuterol (PROVENTIL) (2.5 MG/3ML) 0.083% nebulizer solution (  Not Given 05/13/21 2221)  amoxicillin-clavulanate (AUGMENTIN)  875-125 MG per tablet 1 tablet (has no administration in time range)  potassium chloride SA (KLOR-CON) CR tablet 20 mEq (has no administration in time range)  aspirin chewable tablet 324 mg (324 mg Oral Given 05/13/21 2203)  magnesium sulfate IVPB 2 g 50 mL (0 g Intravenous Stopped 05/13/21 2317)  sodium chloride 0.9 % bolus 500 mL (0 mLs  Intravenous Stopped 05/14/21 0006)  albuterol (PROVENTIL) (2.5 MG/3ML) 0.083% nebulizer solution 10 mg (10 mg Nebulization Given 05/13/21 2221)    ED Course  I have reviewed the triage vital signs and the nursing notes.  Pertinent labs & imaging results that were available during my care of the patient were reviewed by me and considered in my medical decision making (see chart for details).    MDM Rules/Calculators/A&P                            Patient Vitals for the past 24 hrs:  BP Temp Temp src Pulse Resp SpO2 Height Weight  05/14/21 0009 101/71 -- -- (!) 103 20 97 % -- --  05/13/21 2244 96/72 -- -- (!) 105 18 99 % -- --  05/13/21 2126 -- -- -- -- -- -- 5\' 4"  (1.626 m) 44.9 kg  05/13/21 2123 -- -- -- -- (!) 21 100 % -- --  05/13/21 2120 106/89 97.7 F (36.5 C) Oral (!) 115 -- 97 % -- --  05/13/21 2119 -- -- -- -- -- 100 % -- --    12:04 AM Reevaluation with update and discussion. After initial assessment and treatment, an updated evaluation reveals she states she is feeling better at this time and feels like she can go home.  Oxygenation is 97% on 4 L of nasal cannula oxygen, her usual treatment.  Awaiting second troponin.  Anticipate discharge after. Daleen Bo   Medical Decision Making:  This patient is presenting for evaluation of shortness of breath with chest pain, which does require a range of treatment options, and is a complaint that involves a moderate risk of morbidity and mortality. The differential diagnoses include pneumonia, COPD exacerbation, ACS. I decided to review old records, and in summary elderly female with history of COPD with prior abnormal pulmonary PET scan.  I did not require additional historical information from anyone.  Clinical Laboratory Tests Ordered, included CBC, Metabolic panel, and troponin EPIC, viral panel . Review indicates normal except potassium low, chloride low, glucose high, BUN low, calcium low, hemoglobin low. Radiologic Tests  Ordered, included chest x-ray.  I independently Visualized: Radiograph images, which show similar to prior findings with persistent bilateral inflammatory/infectious changes.  These are marginally worse compared to PET scan done previously    Critical Interventions-clinical evaluation, laboratory testing, radiography, medication treatment, observation and reassessment  After These Interventions, the Patient was reevaluated and was found improving, stable for discharge.  Suspect COPD exacerbation with pneumonia.  Doubt ACS or heart failure.  CRITICAL CARE-yes Performed by: Daleen Bo  Nursing Notes Reviewed/ Care Coordinated Applicable Imaging Reviewed Interpretation of Laboratory Data incorporated into ED treatment   Disposition by Dr. Dayna Barker after completion of treatment.  Final Clinical Impression(s) / ED Diagnoses Final diagnoses:  COPD exacerbation ([place])  Community acquired pneumonia, unspecified laterality    Rx / DC Orders ED Discharge Orders          Ordered    predniSONE (DELTASONE) 10 MG tablet        05/14/21 0003  amoxicillin-clavulanate (AUGMENTIN) 875-125 MG tablet  2 times daily        05/14/21 0003             Daleen Bo, MD 05/14/21 1100

## 2021-05-13 NOTE — ED Triage Notes (Signed)
Pt arrived via EMS from home. Pt has hx of COPD and is at 4L of oxygen at baseline. Pt was unable to walk after exertion today. Pt attempted a home nebulizer treatment with no improvement. Pt has a 20G in her left forearm. Pt has gotten 10 of albuterol, 1 of atrovent, 125 of solu-medrol with EMS.

## 2021-05-14 ENCOUNTER — Emergency Department (HOSPITAL_COMMUNITY): Payer: Medicare Other

## 2021-05-14 ENCOUNTER — Other Ambulatory Visit: Payer: Self-pay

## 2021-05-14 DIAGNOSIS — F209 Schizophrenia, unspecified: Secondary | ICD-10-CM | POA: Insufficient documentation

## 2021-05-14 DIAGNOSIS — D329 Benign neoplasm of meninges, unspecified: Secondary | ICD-10-CM | POA: Insufficient documentation

## 2021-05-14 DIAGNOSIS — F71 Moderate intellectual disabilities: Secondary | ICD-10-CM | POA: Insufficient documentation

## 2021-05-14 DIAGNOSIS — F419 Anxiety disorder, unspecified: Secondary | ICD-10-CM | POA: Insufficient documentation

## 2021-05-14 DIAGNOSIS — H509 Unspecified strabismus: Secondary | ICD-10-CM | POA: Insufficient documentation

## 2021-05-14 DIAGNOSIS — F32A Depression, unspecified: Secondary | ICD-10-CM | POA: Insufficient documentation

## 2021-05-14 LAB — RESP PANEL BY RT-PCR (FLU A&B, COVID) ARPGX2
Influenza A by PCR: NEGATIVE
Influenza B by PCR: NEGATIVE
SARS Coronavirus 2 by RT PCR: NEGATIVE

## 2021-05-14 LAB — TROPONIN I (HIGH SENSITIVITY): Troponin I (High Sensitivity): 5 ng/L (ref <18)

## 2021-05-14 MED ORDER — PREDNISONE 10 MG PO TABS: ORAL_TABLET | ORAL | 0 refills | Status: DC

## 2021-05-14 MED ORDER — POTASSIUM CHLORIDE CRYS ER 20 MEQ PO TBCR
20.0000 meq | EXTENDED_RELEASE_TABLET | Freq: Once | ORAL | Status: AC
  Administered 2021-05-14: 20 meq via ORAL
  Filled 2021-05-14: qty 1

## 2021-05-14 MED ORDER — AMOXICILLIN-POT CLAVULANATE 875-125 MG PO TABS: 1.0000 | ORAL_TABLET | Freq: Two times a day (BID) | ORAL | 0 refills | Status: DC

## 2021-05-14 NOTE — ED Provider Notes (Signed)
12:19 AM Assumed care from Dr. Eulis Foster, please see their note for full history, physical and decision making until this point. In brief this is a 60 y.o. year old female who presented to the ED tonight with Shortness of Breath (/)     60 year old female history of COPD here with COPD exacerbation likely commune acquired pneumonia.  She is given some breathing treatments and needs reevaluation.  On reevaluation patient appears well.  She states her breathing is significantly improved.  She is on her baseline oxygen with good oxygen saturations.  She has not tachypneic.  Antibiotics already started and outpatient management has already been ordered by Dr. Eulis Foster.  Discharge papers have been pregnant.  Patient will follow-up with PCP as directed  Discharge instructions, including strict return precautions for new or worsening symptoms, given. Patient and/or family verbalized understanding and agreement with the plan as described.   Labs, studies and imaging reviewed by myself and considered in medical decision making if ordered. Imaging interpreted by radiology.  Labs Reviewed  BASIC METABOLIC PANEL - Abnormal; Notable for the following components:      Result Value   Potassium 3.3 (*)    Chloride 96 (*)    Glucose, Bld 121 (*)    BUN 7 (*)    Calcium 8.4 (*)    All other components within normal limits  CBC - Abnormal; Notable for the following components:   RBC 3.71 (*)    Hemoglobin 10.0 (*)    HCT 30.8 (*)    RDW 15.7 (*)    All other components within normal limits  RESP PANEL BY RT-PCR (FLU A&B, COVID) ARPGX2  TROPONIN I (HIGH SENSITIVITY)  TROPONIN I (HIGH SENSITIVITY)    DG Chest Port 1 View  Final Result    DG Chest Port 1 View    (Results Pending)    No follow-ups on file.    Nona Gracey, Corene Cornea, MD 05/14/21 [phone]

## 2021-05-14 NOTE — Discharge Instructions (Addendum)
Continue to use your regular medications.  We sent prescriptions to your pharmacy to start taking tomorrow morning.  Call your doctor for a follow-up appointment.  Your chest x-ray will need follow-up with repeat chest x-ray in 3 to 4 weeks.  Your doctor may need to refer you to a pulmonologist.  Have your doctor check your potassium level next week.

## 2021-05-17 ENCOUNTER — Ambulatory Visit (INDEPENDENT_AMBULATORY_CARE_PROVIDER_SITE_OTHER): Payer: Medicare Other

## 2021-05-17 ENCOUNTER — Other Ambulatory Visit: Payer: Self-pay

## 2021-05-17 ENCOUNTER — Encounter: Payer: Self-pay | Admitting: Gastroenterology

## 2021-05-17 DIAGNOSIS — M810 Age-related osteoporosis without current pathological fracture: Secondary | ICD-10-CM | POA: Diagnosis not present

## 2021-05-17 MED ORDER — DENOSUMAB 60 MG/ML ~~LOC~~ SOSY
60.0000 mg | PREFILLED_SYRINGE | Freq: Once | SUBCUTANEOUS | Status: AC
  Administered 2021-05-17: 60 mg via SUBCUTANEOUS

## 2021-05-17 NOTE — Progress Notes (Signed)
Per orders of Allie Bossier, NP, in the absence of DR. Danise Mina, injection of Prolia given by Loreen Freud. Patient tolerated injection well.

## 2021-05-18 ENCOUNTER — Ambulatory Visit (INDEPENDENT_AMBULATORY_CARE_PROVIDER_SITE_OTHER): Payer: Medicare Other | Admitting: Family Medicine

## 2021-05-18 ENCOUNTER — Encounter: Payer: Self-pay | Admitting: Family Medicine

## 2021-05-18 ENCOUNTER — Encounter: Payer: Self-pay | Admitting: Obstetrics and Gynecology

## 2021-05-18 ENCOUNTER — Other Ambulatory Visit: Payer: Self-pay

## 2021-05-18 ENCOUNTER — Ambulatory Visit: Payer: Medicare (Managed Care) | Attending: Obstetrics and Gynecology | Admitting: Obstetrics and Gynecology

## 2021-05-18 VITALS — BP 108/70 | HR 92 | Temp 98.0°F | Ht 64.0 in | Wt 104.5 lb

## 2021-05-18 VITALS — BP 120/78 | Wt 128.0 lb

## 2021-05-18 DIAGNOSIS — R64 Cachexia: Secondary | ICD-10-CM

## 2021-05-18 DIAGNOSIS — E538 Deficiency of other specified B group vitamins: Secondary | ICD-10-CM | POA: Diagnosis not present

## 2021-05-18 DIAGNOSIS — M810 Age-related osteoporosis without current pathological fracture: Secondary | ICD-10-CM | POA: Diagnosis not present

## 2021-05-18 DIAGNOSIS — J449 Chronic obstructive pulmonary disease, unspecified: Secondary | ICD-10-CM | POA: Diagnosis not present

## 2021-05-18 DIAGNOSIS — Z720 Tobacco use: Secondary | ICD-10-CM | POA: Diagnosis not present

## 2021-05-18 DIAGNOSIS — J189 Pneumonia, unspecified organism: Secondary | ICD-10-CM | POA: Diagnosis not present

## 2021-05-18 DIAGNOSIS — Z515 Encounter for palliative care: Secondary | ICD-10-CM

## 2021-05-18 DIAGNOSIS — I700 Atherosclerosis of aorta: Secondary | ICD-10-CM

## 2021-05-18 DIAGNOSIS — Z23 Encounter for immunization: Secondary | ICD-10-CM | POA: Diagnosis not present

## 2021-05-18 DIAGNOSIS — F419 Anxiety disorder, unspecified: Secondary | ICD-10-CM

## 2021-05-18 DIAGNOSIS — F332 Major depressive disorder, recurrent severe without psychotic features: Secondary | ICD-10-CM

## 2021-05-18 DIAGNOSIS — Z9981 Dependence on supplemental oxygen: Secondary | ICD-10-CM | POA: Diagnosis not present

## 2021-05-18 DIAGNOSIS — J9611 Chronic respiratory failure with hypoxia: Secondary | ICD-10-CM

## 2021-05-18 DIAGNOSIS — I10 Essential (primary) hypertension: Secondary | ICD-10-CM | POA: Insufficient documentation

## 2021-05-18 DIAGNOSIS — Z01419 Encounter for gynecological examination (general) (routine) without abnormal findings: Secondary | ICD-10-CM

## 2021-05-18 DIAGNOSIS — E039 Hypothyroidism, unspecified: Secondary | ICD-10-CM | POA: Insufficient documentation

## 2021-05-18 LAB — BASIC METABOLIC PANEL
BUN: 16 mg/dL (ref 6–23)
CO2: 40 mEq/L — ABNORMAL HIGH (ref 19–32)
Calcium: 9.1 mg/dL (ref 8.4–10.5)
Chloride: 96 mEq/L (ref 96–112)
Creatinine, Ser: 0.64 mg/dL (ref 0.40–1.20)
GFR: 94.75 mL/min (ref >60.00)
Glucose, Bld: 89 mg/dL (ref 70–99)
Potassium: 3.6 mEq/L (ref 3.5–5.1)
Sodium: 143 mEq/L (ref 135–145)

## 2021-05-18 LAB — CBC WITH DIFFERENTIAL/PLATELET
Basophils Absolute: 0 10*3/uL (ref 0.0–0.1)
Basophils Relative: 0.1 % (ref 0.0–3.0)
Eosinophils Absolute: 0 10*3/uL (ref 0.0–0.7)
Eosinophils Relative: 0.5 % (ref 0.0–5.0)
HCT: 33.2 % — ABNORMAL LOW (ref 36.0–46.0)
Hemoglobin: 10.8 g/dL — ABNORMAL LOW (ref 12.0–15.0)
Lymphocytes Relative: 11 % — ABNORMAL LOW (ref 12.0–46.0)
Lymphs Abs: 1 10*3/uL (ref 0.7–4.0)
MCHC: 32.6 g/dL (ref 30.0–36.0)
MCV: 82.5 fl (ref 78.0–100.0)
Monocytes Absolute: 0.4 10*3/uL (ref 0.1–1.0)
Monocytes Relative: 4.2 % (ref 3.0–12.0)
Neutro Abs: 7.9 10*3/uL — ABNORMAL HIGH (ref 1.4–7.7)
Neutrophils Relative %: 84.2 % — ABNORMAL HIGH (ref 43.0–77.0)
Platelets: 219 10*3/uL (ref 150.0–400.0)
RBC: 4.02 Mil/uL (ref 3.87–5.11)
RDW: 16.4 % — ABNORMAL HIGH (ref 11.5–15.5)
WBC: 9.4 10*3/uL (ref 4.0–10.5)

## 2021-05-18 LAB — VITAMIN B12: Vitamin B-12: 282 pg/mL (ref 211–911)

## 2021-05-18 LAB — FOLATE: Folate: 12.3 ng/mL (ref >5.9)

## 2021-05-18 NOTE — Progress Notes (Signed)
Audrey Bartlett is a 60 y.o. G0P0, LMP PM who presents for her annual gyn exam.   Seen w/mother due to moderate intellectual disabilities.    Denies PMB or VMS  Denies breast problems.  Denies bladder issues.  Not sexually active    Last Pap Smear: 01/10/2017   Mammogram:Needs to schedule; 2014   Colonoscopy: needs to schedule    Patient's medications, allergies, past medical, surgical, social and family histories were reviewed and updated as appropriate.      No past medical history on file. Past Surgical History:   Procedure Laterality Date    CRANIECTOMY  10/2002    infratentorial for excison of menningioma    DILATION AND CURETTAGE OF UTERUS  01/2006    Polypectomy      Uterine with hysteroscope under U?S guidance due to small Arcute Uterus&Endocervical polypectomy    Straubismus Surgery of Superior Oblique Muscle  1963      Social History     Socioeconomic History    Marital status: Unknown   Tobacco Use    Smokeless tobacco: Never   Substance and Sexual Activity    Alcohol use: Not Currently    Drug use: Never    No family history on file.   Current Outpatient Medications   Medication Sig    donepezil (ARICEPT) 10 MG tablet Take 10 mg by mouth daily    haloperidol (HALDOL) 0.5 mg tablet SMARTSIG:Tablet(s) By Mouth    levothyroxine (SYNTHROID, LEVOTHROID) 50 mcg tablet Take 50 mcg by mouth daily    losartan-hydrochlorothiazide (HYZAAR) 50-12.5 mg per tablet Take 1 tablet by mouth daily    mirtazapine (REMERON) 15 mg tablet Take 15 mg by mouth nightly    LORazepam (ATIVAN) 0.5 mg tablet Take 0.5 mg by mouth daily as needed (Patient not taking: Reported on 05/18/2021)    haloperidol (HALDOL) 1 mg tablet Take 1 mg by mouth nightly    Not on File   Patient Active Problem List   Diagnosis Code    Meningioma 2004 D32.9    Schizophrenia F20.9    Moderate intellectual disabilities F71    Strabismus H50.9    Anxiety and depression F41.9, F32.A    Hypothyroidism E03.9    Hypertension, benign I10          Physical Exam:    (seen w/ her mother. Both declined chaperone)    BP 120/78 (BP Location: Left arm, Patient Position: Sitting, Cuff Size: adult)    Wt 58.1 kg (128 lb)     GENERAL: alert, oriented, well appearing in NAD.  NECK: supple, No mass or adenopathy  LUNGS: Symmetrical respirations unlabored with no audible wheezes.  BREASTS: No palpable masses or nipple discharge. No skin changes. No axillary or clavicular adenopathy.  ABDOMEN: Soft, non-tender, no palpable masses.  PELVIC:   Normal external female genitalia, vulva, vestibule and  clitoris.   Bartholins, Skenes and urethral glands WNL.  Urethra is normal without prolapse or tenderness.  Vagina with Atrophy, No Cystocele, Rectocele, or lesions.  Cervix: Normal appearing, There is no CMT  Uterus:  midline, mobile, and non-tender. No prolapse.  Adnexa are without obvious masses or tenderness.  Perineum normal.  RECTAL: normal sphincter tone, No fissures, hemorrhoids, rectal masses, or blood noted.  EXTREMITIES: Bilaterally no edema.    Assessment/Plan:    1. Well woman exam with routine gynecological exam  Cotest Pap every 3 years  Cotest done  Prior mammogram and yearly mammogram encouraged and discussed.  Colonoscopy referral offered.  COUNSELED REGARDING MENOPAUSE  COUNSELING provided in areas desired by patient.

## 2021-05-18 NOTE — Progress Notes (Signed)
Patient ID: Christine Serrano, female    DOB: [birth year], 61 y.o.   MRN: [id number]  This visit was conducted in person.  BP 108/70   Pulse 92   Temp 98 F (36.7 C) (Temporal)   Ht 5\' 4"  (1.626 m)   Wt 104 lb 8 oz (47.4 kg)   LMP  (LMP Unknown)   SpO2 92% Comment: 2L [place]  BMI 17.94 kg/m    CC: ER  f/u visit  Subjective:   HPI: Christine Serrano is a 60 y.o. female presenting on 05/18/2021 for Follow-up (Here for palliative f/u.  Seen on 05/13/21 at [facility] ED, COPD exacerbation; community acquired pneumonia.  Accompanied by roommate, Christine Serrano. )   Prolia injection done yesterday (05/17/2021).   Recent ER visit 05/13/2021 for COPD exacerbation and possible CAP treated with albuterol breathing treatments as well as augmentin 7d course, prednisone 60mg  taper.  No fevers/chills, cough overall improving since augmentin started.  Hospital records reviewed. Med rec performed.   Continues breztri 2 puffs BID and albuterol inh/neb PRN.  Upcoming pulm appt 05/30/2021.   Recent virtual visit with me last week despite wellbutrin XL 150mg  daily, sertraline 150mg  daily, remeron 7.5mg  nightly. Planning to get new puppy next week (to name him [place]).      Relevant past medical, surgical, family and social history reviewed and updated as indicated. Interim medical history since our last visit reviewed. Allergies and medications reviewed and updated. Outpatient Medications Prior to Visit  Medication Sig Dispense Refill   albuterol (PROVENTIL) (2.5 MG/3ML) 0.083% nebulizer solution Take 3 mLs (2.5 mg total) by nebulization every 6 (six) hours as needed for wheezing or shortness of breath. 150 mL 6   albuterol (VENTOLIN HFA) 108 (90 Base) MCG/ACT inhaler USE 2 INHALATIONS BY MOUTH  EVERY 6 HOURS AS NEEDED FOR WHEEZING OR SHORTNESS OF  BREATH 18 g 6   amoxicillin-clavulanate (AUGMENTIN) 875-125 MG tablet Take 1 tablet by mouth 2 (two) times daily. One po bid x 7 days 14 tablet 0   Ascorbic Acid  (VITAMIN C WITH ROSE HIPS) 500 MG tablet Take 500 mg by mouth daily.     aspirin EC 81 MG tablet Take 81 mg by mouth daily.     benzonatate (TESSALON) 100 MG capsule Take 1 capsule (100 mg total) by mouth 3 (three) times daily as needed for cough. 30 capsule 3   Budeson-Glycopyrrol-Formoterol (BREZTRI AEROSPHERE) 160-9-4.8 MCG/ACT AERO Inhale 2 puffs into the lungs in the morning and at bedtime. 32.1 g 3   buPROPion (WELLBUTRIN XL) 150 MG 24 hr tablet Take 1 tablet (150 mg total) by mouth daily. 90 tablet 1   cholecalciferol (VITAMIN D3) 25 MCG (1000 UNIT) tablet Take 1,000 Units by mouth daily.     fluticasone (FLONASE) 50 MCG/ACT nasal spray Place 2 sprays into both nostrils daily. 48 g 3   folic acid (FOLVITE) 1 MG tablet Take 1 tablet (1 mg total) by mouth daily. 90 tablet 1   gabapentin (NEURONTIN) 100 MG capsule Take 1 capsule (100 mg total) by mouth 2 (two) times daily. 180 capsule 1   hydrOXYzine (ATARAX/VISTARIL) 25 MG tablet Take 1 tablet (25 mg total) by mouth 2 (two) times daily as needed for anxiety. 60 tablet 6   LINZESS 290 MCG CAPS capsule Take 1 capsule (290 mcg total) by mouth daily as needed (constipation). TAKE 1 CAPSULE EVERY DAY AS NEEDED FOR CONSTIPATION AS DIRECTED 90 capsule 1   meclizine (ANTIVERT) 25 MG tablet Take  1 tablet (25 mg total) by mouth 3 (three) times daily as needed for dizziness. 30 tablet 0   methocarbamol (ROBAXIN) 500 MG tablet TAKE 1 TABLET BY MOUTH 3 TIMES DAILY AS NEEDED FOR MUSCLE SPASMS (SEDATION PRECAUTIONS). 40 tablet 3   mirtazapine (REMERON) 7.5 MG tablet Take 1 tablet (7.5 mg total) by mouth at bedtime. 90 tablet 1   Multiple Vitamin (MULTIVITAMIN WITH MINERALS) TABS tablet Take 1 tablet by mouth daily.     naproxen (NAPROSYN) 375 MG tablet TAKE 1 TABLET BY MOUTH TWICE A DAY AS NEEDED FOR PAIN WITH FOOD 30 tablet 1   omeprazole (PRILOSEC) 40 MG capsule Take 1 capsule (40 mg total) by mouth daily. 90 capsule 1   OXYGEN Inhale 4 L/min into the lungs  continuous.      polyethylene glycol powder (GLYCOLAX/MIRALAX) 17 GM/SCOOP powder Take 17 g by mouth daily as needed for moderate constipation. 3350 g 1   predniSONE (DELTASONE) 10 MG tablet Take q day 6,5,4,3,2,1 21 tablet 0   predniSONE (DELTASONE) 5 MG tablet Take 2 tablets (10 mg total) by mouth daily with breakfast.     sertraline (ZOLOFT) 100 MG tablet Take 1.5 tablets (150 mg total) by mouth daily. 135 tablet 1   thiamine 100 MG tablet Take 1 tablet (100 mg total) by mouth daily. 30 tablet 0   vitamin B-12 (CYANOCOBALAMIN) 1000 MCG tablet Take 1 tablet (1,000 mcg total) by mouth daily.     doxycycline (VIBRA-TABS) 100 MG tablet Take 1 tablet (100 mg total) by mouth 2 (two) times daily. 14 tablet 0   No facility-administered medications prior to visit.     Per HPI unless specifically indicated in ROS section below Review of Systems  Objective:  BP 108/70   Pulse 92   Temp 98 F (36.7 C) (Temporal)   Ht 5\' 4"  (1.626 m)   Wt 104 lb 8 oz (47.4 kg)   LMP  (LMP Unknown)   SpO2 92% Comment: 2L [place]  BMI 17.94 kg/m   Wt Readings from Last 3 Encounters:  05/18/21 104 lb 8 oz (47.4 kg)  05/13/21 99 lb (44.9 kg)  05/07/21 99 lb 2 oz (45 kg)      Physical Exam Vitals and nursing note reviewed.  Constitutional:      General: She is not in acute distress.    Appearance: She is underweight.     Comments: In wheelchair, O2 by [place] in place  Cardiovascular:     Rate and Rhythm: Normal rate and regular rhythm.     Pulses: Normal pulses.     Heart sounds: Normal heart sounds. No murmur heard. Pulmonary:     Effort: No respiratory distress.     Breath sounds: Wheezing (mild throughout) present. No rhonchi or rales.  Musculoskeletal:     Right lower leg: No edema.     Left lower leg: No edema.  Skin:    Findings: Ecchymosis (L forearm at sites of recent IVs) present. No rash.  Neurological:     Mental Status: She is alert.  Psychiatric:        Mood and Affect: Mood normal.         Behavior: Behavior normal.      Results for orders placed or performed in visit on 05/18/21  Vitamin B12  Result Value Ref Range   Vitamin B-12 282 211 - 911 pg/mL  Folate  Result Value Ref Range   Folate 12.3 >5.9 ng/mL  Basic metabolic panel  Result Value Ref Range   Sodium 143 135 - 145 mEq/L   Potassium 3.6 3.5 - 5.1 mEq/L   Chloride 96 96 - 112 mEq/L   CO2 40 (H) 19 - 32 mEq/L   Glucose, Bld 89 70 - 99 mg/dL   BUN 16 6 - 23 mg/dL   Creatinine, Ser 0.64 0.40 - 1.20 mg/dL   GFR 94.75 >60.00 mL/min   Calcium 9.1 8.4 - 10.5 mg/dL  CBC with Differential/Platelet  Result Value Ref Range   WBC 9.4 4.0 - 10.5 K/uL   RBC 4.02 3.87 - 5.11 Mil/uL   Hemoglobin 10.8 (L) 12.0 - 15.0 g/dL   HCT 33.2 (L) 36.0 - 46.0 %   MCV 82.5 78.0 - 100.0 fl   MCHC 32.6 30.0 - 36.0 g/dL   RDW 16.4 (H) 11.5 - 15.5 %   Platelets 219.0 150.0 - 400.0 K/uL   Neutrophils Relative % 84.2 (H) 43.0 - 77.0 %   Lymphocytes Relative 11.0 (L) 12.0 - 46.0 %   Monocytes Relative 4.2 3.0 - 12.0 %   Eosinophils Relative 0.5 0.0 - 5.0 %   Basophils Relative 0.1 0.0 - 3.0 %   Neutro Abs 7.9 (H) 1.4 - 7.7 K/uL   Lymphs Abs 1.0 0.7 - 4.0 K/uL   Monocytes Absolute 0.4 0.1 - 1.0 K/uL   Eosinophils Absolute 0.0 0.0 - 0.7 K/uL   Basophils Absolute 0.0 0.0 - 0.1 K/uL    Assessment & Plan:  This visit occurred during the SARS-CoV-2 public health emergency.  Safety protocols were in place, including screening questions prior to the visit, additional usage of staff PPE, and extensive cleaning of exam room while observing appropriate contact time as indicated for disinfecting solutions.   Problem List Items Addressed This Visit     MDD (major depressive disorder), recurrent episode, severe ([place])    Overall deterioration despite regular sertraline 150mg , wellbutrin XL 150mg , and remeron 7.5mg  nightly.  We previously discussed change in regimen however she is looking forward to receiving new puppy (planned next week) and  thinks this will significantly help. I asked her to contact us over the next few weeks with mood update.       COPD, very severe ([place])    Continues breztri 2 puffs BID with PRN albuterol.  Currently on prednisone taper from the ER - I did recommend she call pulm for daily prednisone refill - they state they have enough of this at home for now.       Relevant Medications   predniSONE (DELTASONE) 5 MG tablet   Aortic atherosclerosis (HCC)    Continues aspirin Will not start statin given other comorbidities      Tobacco abuse    Continued smoker - precontemplative.       Anxiety    Hydroxyzine PRN - avoid benzos in chronic respiratory failure Deteriorated - see below.       Community acquired pneumonia - Primary   Relevant Orders   Vitamin B12 (Completed)   Folate (Completed)   Basic metabolic panel (Completed)   CBC with Differential/Platelet (Completed)   Osteoporosis    Received prolia injection 05/17/2021.       Folate deficiency    Update levels on daily folate       Chronic respiratory failure with hypoxia, on home O2 therapy ([place])    Continues home supplementation at 4L [place] Down to 2L while out of the house.       Palliative care status  Followed by [facility] palliative care at home - has not recently had home visit - # provided to call and schedule f/u home visit.       Low serum vitamin B12    Update levels on 1000 mcg vit b12 daily.       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  Orders Placed This Encounter  Procedures   Flu Vaccine QUAD 9mo+IM (Fluarix, Fluzone & Alfiuria Quad PF)   Vitamin B12   Folate   Basic metabolic panel   CBC with  Differential/Platelet     Patient Instructions  Labs today  Call [facility] palliative care to reschedule appointment (Telephone: [phone]) Keep me updated on mood and new puppy.   Follow up plan: Return if symptoms worsen or fail to improve.  Ria Bush, MD

## 2021-05-18 NOTE — Patient Instructions (Addendum)
Labs today  Call [facility] palliative care to reschedule appointment (Telephone: [phone]) Keep me updated on mood and new puppy.

## 2021-05-19 NOTE — Assessment & Plan Note (Signed)
Update levels on daily folate

## 2021-05-19 NOTE — Assessment & Plan Note (Signed)
Continues breztri 2 puffs BID with PRN albuterol.  Currently on prednisone taper from the ER - I did recommend she call pulm for daily prednisone refill - they state they have enough of this at home for now.

## 2021-05-19 NOTE — Assessment & Plan Note (Signed)
Update levels on 1000 mcg vit b12 daily.

## 2021-05-19 NOTE — Assessment & Plan Note (Signed)
Overall deterioration despite regular sertraline 150mg , wellbutrin XL 150mg , and remeron 7.5mg  nightly.  We previously discussed change in regimen however she is looking forward to receiving new puppy (planned next week) and thinks this will significantly help. I asked her to contact us over the next few weeks with mood update.

## 2021-05-19 NOTE — Assessment & Plan Note (Signed)
Received prolia injection 05/17/2021.

## 2021-05-19 NOTE — Assessment & Plan Note (Signed)
Followed by [facility] palliative care at home - has not recently had home visit - # provided to call and schedule f/u home visit.

## 2021-05-19 NOTE — Assessment & Plan Note (Signed)
Continued smoker - precontemplative.

## 2021-05-19 NOTE — Assessment & Plan Note (Signed)
Continues home supplementation at 4L [place] Down to 2L while out of the house.

## 2021-05-19 NOTE — Assessment & Plan Note (Addendum)
Hydroxyzine PRN - avoid benzos in chronic respiratory failure Deteriorated - see below.

## 2021-05-19 NOTE — Assessment & Plan Note (Addendum)
Continues aspirin Will not start statin given other comorbidities

## 2021-05-25 ENCOUNTER — Telehealth: Payer: Self-pay | Admitting: Family Medicine

## 2021-05-25 DIAGNOSIS — F419 Anxiety disorder, unspecified: Secondary | ICD-10-CM

## 2021-05-25 DIAGNOSIS — F331 Major depressive disorder, recurrent, moderate: Secondary | ICD-10-CM

## 2021-05-25 MED ORDER — SERTRALINE HCL 100 MG PO TABS: 150.0000 mg | ORAL_TABLET | Freq: Every day | ORAL | 1 refills | Status: DC

## 2021-05-25 NOTE — Telephone Encounter (Signed)
E-scribed refill 

## 2021-05-25 NOTE — Telephone Encounter (Signed)
  Encourage patient to contact the pharmacy for refills or they can request refills through [place]:  Please schedule appointment if longer than 1 year  NEXT APPOINTMENT DATE:05/29/21  MEDICATION:sertraline (ZOLOFT) 100 MG tablet,albuterol (VENTOLIN HFA) 108 (90 Base) MCG/ACT inhaler  Is the patient out of medication? yes  PHARMACY:[id number] - [place], [place] - [address]  Let patient know to contact pharmacy at the end of the day to make sure medication is ready.  Please notify patient to allow 48-72 hours to process  CLINICAL FILLS OUT ALL BELOW:   LAST REFILL:  QTY:  REFILL DATE:    OTHER COMMENTS:    Okay for refill?  Please advise

## 2021-05-26 ENCOUNTER — Emergency Department
Admission: EM | Admit: 2021-05-26 | Discharge: 2021-05-26 | Disposition: A | Discharge status: Home / Self Care | Payer: Medicare Other | Attending: Emergency Medicine | Admitting: Emergency Medicine

## 2021-05-26 ENCOUNTER — Emergency Department: Payer: Medicare Other

## 2021-05-26 ENCOUNTER — Other Ambulatory Visit: Payer: Self-pay

## 2021-05-26 ENCOUNTER — Encounter: Payer: Self-pay | Admitting: Emergency Medicine

## 2021-05-26 DIAGNOSIS — R0602 Shortness of breath: Secondary | ICD-10-CM | POA: Diagnosis present

## 2021-05-26 DIAGNOSIS — J441 Chronic obstructive pulmonary disease with (acute) exacerbation: Secondary | ICD-10-CM | POA: Diagnosis not present

## 2021-05-26 DIAGNOSIS — Z7982 Long term (current) use of aspirin: Secondary | ICD-10-CM | POA: Diagnosis not present

## 2021-05-26 DIAGNOSIS — Z7952 Long term (current) use of systemic steroids: Secondary | ICD-10-CM | POA: Diagnosis not present

## 2021-05-26 DIAGNOSIS — Z20822 Contact with and (suspected) exposure to covid-19: Secondary | ICD-10-CM | POA: Insufficient documentation

## 2021-05-26 DIAGNOSIS — F17210 Nicotine dependence, cigarettes, uncomplicated: Secondary | ICD-10-CM | POA: Diagnosis not present

## 2021-05-26 DIAGNOSIS — Z85828 Personal history of other malignant neoplasm of skin: Secondary | ICD-10-CM | POA: Diagnosis not present

## 2021-05-26 LAB — RESP PANEL BY RT-PCR (FLU A&B, COVID) ARPGX2
Influenza A by PCR: NEGATIVE
Influenza B by PCR: NEGATIVE
SARS Coronavirus 2 by RT PCR: NEGATIVE

## 2021-05-26 LAB — CBC WITH DIFFERENTIAL/PLATELET
Abs Immature Granulocytes: 0.01 10*3/uL (ref 0.00–0.07)
Basophils Absolute: 0 10*3/uL (ref 0.0–0.1)
Basophils Relative: 1 %
Eosinophils Absolute: 0 10*3/uL (ref 0.0–0.5)
Eosinophils Relative: 1 %
HCT: 34.9 % — ABNORMAL LOW (ref 36.0–46.0)
Hemoglobin: 10.9 g/dL — ABNORMAL LOW (ref 12.0–15.0)
Immature Granulocytes: 0 %
Lymphocytes Relative: 21 %
Lymphs Abs: 1.2 10*3/uL (ref 0.7–4.0)
MCH: 26.8 pg (ref 26.0–34.0)
MCHC: 31.2 g/dL (ref 30.0–36.0)
MCV: 86 fL (ref 80.0–100.0)
Monocytes Absolute: 0.1 10*3/uL (ref 0.1–1.0)
Monocytes Relative: 2 %
Neutro Abs: 4.2 10*3/uL (ref 1.7–7.7)
Neutrophils Relative %: 75 %
Platelets: 196 10*3/uL (ref 150–400)
RBC: 4.06 MIL/uL (ref 3.87–5.11)
RDW: 15.3 % (ref 11.5–15.5)
WBC: 5.6 10*3/uL (ref 4.0–10.5)
nRBC: 0 % (ref 0.0–0.2)

## 2021-05-26 LAB — TROPONIN I (HIGH SENSITIVITY): Troponin I (High Sensitivity): 10 ng/L (ref <18)

## 2021-05-26 LAB — COMPREHENSIVE METABOLIC PANEL
ALT: 13 U/L (ref 0–44)
AST: 24 U/L (ref 15–41)
Albumin: 4 g/dL (ref 3.5–5.0)
Alkaline Phosphatase: 95 U/L (ref 38–126)
Anion gap: 8 (ref 5–15)
BUN: 7 mg/dL — ABNORMAL LOW (ref 8–23)
CO2: 36 mmol/L — ABNORMAL HIGH (ref 22–32)
Calcium: 8.8 mg/dL — ABNORMAL LOW (ref 8.9–10.3)
Chloride: 94 mmol/L — ABNORMAL LOW (ref 98–111)
Creatinine, Ser: 0.42 mg/dL — ABNORMAL LOW (ref 0.44–1.00)
GFR, Estimated: >60 mL/min (ref >60)
Glucose, Bld: 138 mg/dL — ABNORMAL HIGH (ref 70–99)
Potassium: 3.5 mmol/L (ref 3.5–5.1)
Sodium: 138 mmol/L (ref 135–145)
Total Bilirubin: 0.4 mg/dL (ref 0.3–1.2)
Total Protein: 7.2 g/dL (ref 6.5–8.1)

## 2021-05-26 LAB — LACTIC ACID, PLASMA: Lactic Acid, Venous: 1.4 mmol/L (ref 0.5–1.9)

## 2021-05-26 MED ORDER — PREDNISONE 50 MG PO TABS: 50.0000 mg | ORAL_TABLET | Freq: Every day | ORAL | 0 refills | Status: DC

## 2021-05-26 NOTE — ED Triage Notes (Signed)
Pt presents to ER via EMS with shortness of breath, per EMS pt was seen in another ER 2 weeks ago and diagnosed with pneumonia. Pt completed antibiotic treatment and steroid treatment, pt continues to feel weak and is congested, productive cough sputum green in color. Pt has history of COPD iin 4L/[place] at home. Initial oxygen saturation per EMS 97% in 4L/[place]. Pt took all her albuterol treatments, EMS administered 2 DuoNebs and 125mg  of solumedrol.

## 2021-05-26 NOTE — ED Provider Notes (Signed)
[facility] Emergency Department Provider Note   ____________________________________________   Event Date/Time   First MD Initiated Contact with Patient 05/26/21 2029     (approximate)  I have reviewed the triage vital signs and the nursing notes.   HISTORY  Chief Complaint Shortness of Breath    HPI Christine Serrano is a 60 y.o. female who presents for shortness of breath  LOCATION: Chest DURATION: 2 weeks prior to arrival TIMING: Initially improved with antibiotics but worsened over the last 4 days after antibiotics and steroids have been completed SEVERITY: Severe QUALITY: Shortness of breath CONTEXT: Patient states that she was diagnosed with pneumonia approximately 2 weeks prior to arrival and placed on antibiotics and steroids.  Patient states that she definitely felt better until these prescriptions ran out and her symptoms have been worsening ever since.  EMS administered 2 DuoNebs and 125 mg of Solu-Medrol MODIFYING FACTORS: Patient states symptoms are improved with her chronic submental oxygen and worsened with any exertion ASSOCIATED SYMPTOMS: Productive cough   Per medical record review, patient has history of COPD on chronic 4 L nasal cannula          Past Medical History:  Diagnosis Date   Cervical cancer ([place])    Chronic idiopathic constipation    Community acquired pneumonia of right upper lobe of lung 12/10/2016   COPD (chronic obstructive pulmonary disease) ([place])    Depression    Endometriosis    HLD (hyperlipidemia)    Smoker     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/22/2020   Elbow laceration, right, initial encounter 10/24/2020   Arm injury, right, initial encounter 10/24/2020   Fall with injury 10/24/2020   Traumatic hematoma of scalp 10/24/2020   MRSA infection 08/23/2020   Dyspnea on exertion 08/23/2020   Abscess of axilla, left  08/14/2020   Low serum vitamin B12 06/09/2020   Cervical neck pain with evidence of disc disease 01/29/2020   Palliative care status 10/23/2019   Impaired mobility 10/20/2019   Chronic respiratory failure with hypoxia, on home O2 therapy ([place]) 10/20/2019   Memory difficulties 10/16/2019   Folate deficiency 09/17/2019   Dizziness 09/11/2019   Anemia 09/11/2019   Hypocalcemia 95/62/1308   Systolic murmur 65/78/4696   Vertebral artery stenosis, left 02/27/2019   Medicare annual wellness visit, subsequent 02/25/2019   Advanced directives, counseling/discussion 02/25/2019   Epistaxis 09/21/2018   Osteoporosis 05/23/2018   Watery diarrhea 05/08/2018   Numbness of right hand 10/10/2017   Benign neoplasm of ascending colon    Benign neoplasm of transverse colon    Benign neoplasm of sigmoid colon    Positive hepatitis C antibody test 02/23/2017   Encounter for general adult medical examination with abnormal findings 02/21/2017   HLD (hyperlipidemia) 02/21/2017   Anxiety 12/10/2016   Community acquired pneumonia 12/10/2016   Tobacco abuse 11/28/2016   Aortic atherosclerosis ([place]) 11/26/2016   Tachycardia 11/01/2016   Pulmonary nodule 10/25/2016   MDD (major depressive disorder), recurrent episode, severe ([place]) 07/03/2016   Chronic constipation 07/03/2016   Alcohol abuse, in remission 07/03/2016   COPD, very severe ([place]) 07/03/2016   Tremor 07/03/2016   Chronic insomnia 07/03/2016    Past Surgical History:  Procedure Laterality Date   ABLATION ON ENDOMETRIOSIS     APPENDECTOMY     BREAST EXCISIONAL BIOPSY Left age 3   benign biopsy - chronic fatty deposit  COLONOSCOPY WITH PROPOFOL N/A 08/04/2017   TAx3, angiodysplastic lesion, diverticulosis, rpt 5 yrs ([place])   EXPLORATORY LAPAROTOMY  1992   endometriosis   FOOT SURGERY Right    needle imbeded   LASER ABLATION CONDYLOMA CERVICAL / VULVAR     NASAL SEPTUM SURGERY Bilateral    OVARIAN CYST SURGERY Bilateral 1980    remote   SALPINGOOPHORECTOMY Right 1983   ectopic pregnancy   TONSILLECTOMY      Prior to Admission medications   Medication Sig Start Date End Date Taking? Authorizing Provider  predniSONE (DELTASONE) 50 MG tablet Take 1 tablet (50 mg total) by mouth daily with breakfast for 5 days. 05/26/21 05/31/21 Yes Naaman Plummer, MD  albuterol (PROVENTIL) (2.5 MG/3ML) 0.083% nebulizer solution Take 3 mLs (2.5 mg total) by nebulization every 6 (six) hours as needed for wheezing or shortness of breath. 05/07/21   Ria Bush, MD  albuterol (VENTOLIN HFA) 108 (90 Base) MCG/ACT inhaler USE 2 INHALATIONS BY MOUTH  EVERY 6 HOURS AS NEEDED FOR WHEEZING OR SHORTNESS OF  BREATH 05/07/21   Ria Bush, MD  amoxicillin-clavulanate (AUGMENTIN) 875-125 MG tablet Take 1 tablet by mouth 2 (two) times daily. One po bid x 7 days 05/14/21   Daleen Bo, MD  Ascorbic Acid (VITAMIN C WITH ROSE HIPS) 500 MG tablet Take 500 mg by mouth daily.    [provider]  aspirin EC 81 MG tablet Take 81 mg by mouth daily.    [provider]  benzonatate (TESSALON) 100 MG capsule Take 1 capsule (100 mg total) by mouth 3 (three) times daily as needed for cough. 05/07/21   Ria Bush, MD  Budeson-Glycopyrrol-Formoterol (BREZTRI AEROSPHERE) 160-9-4.8 MCG/ACT AERO Inhale 2 puffs into the lungs in the morning and at bedtime. 12/13/20   Tyler Pita, MD  buPROPion (WELLBUTRIN XL) 150 MG 24 hr tablet Take 1 tablet (150 mg total) by mouth daily. 05/07/21   Ria Bush, MD  cholecalciferol (VITAMIN D3) 25 MCG (1000 UNIT) tablet Take 1,000 Units by mouth daily.    [provider]  fluticasone (FLONASE) 50 MCG/ACT nasal spray Place 2 sprays into both nostrils daily. 06/06/20   Ria Bush, MD  folic acid (FOLVITE) 1 MG tablet Take 1 tablet (1 mg total) by mouth daily. 05/07/21   Ria Bush, MD  gabapentin (NEURONTIN) 100 MG capsule Take 1 capsule (100 mg total) by mouth 2  (two) times daily. 05/07/21   Ria Bush, MD  hydrOXYzine (ATARAX/VISTARIL) 25 MG tablet Take 1 tablet (25 mg total) by mouth 2 (two) times daily as needed for anxiety. 05/07/21   Ria Bush, MD  LINZESS 290 MCG CAPS capsule Take 1 capsule (290 mcg total) by mouth daily as needed (constipation). TAKE 1 CAPSULE EVERY DAY AS NEEDED FOR CONSTIPATION AS DIRECTED 05/07/21   Ria Bush, MD  meclizine (ANTIVERT) 25 MG tablet Take 1 tablet (25 mg total) by mouth 3 (three) times daily as needed for dizziness. 05/07/21   Ria Bush, MD  methocarbamol (ROBAXIN) 500 MG tablet TAKE 1 TABLET BY MOUTH 3 TIMES DAILY AS NEEDED FOR MUSCLE SPASMS (SEDATION PRECAUTIONS). 05/07/21   Ria Bush, MD  mirtazapine (REMERON) 7.5 MG tablet Take 1 tablet (7.5 mg total) by mouth at bedtime. 05/07/21   Ria Bush, MD  Multiple Vitamin (MULTIVITAMIN WITH MINERALS) TABS tablet Take 1 tablet by mouth daily. 09/12/19   Aline August, MD  naproxen (NAPROSYN) 375 MG tablet TAKE 1 TABLET BY MOUTH TWICE A DAY AS NEEDED FOR PAIN  WITH FOOD 05/07/21   Ria Bush, MD  omeprazole (PRILOSEC) 40 MG capsule Take 1 capsule (40 mg total) by mouth daily. 05/07/21   Ria Bush, MD  OXYGEN Inhale 4 L/min into the lungs continuous.     [provider]  polyethylene glycol powder (GLYCOLAX/MIRALAX) 17 GM/SCOOP powder Take 17 g by mouth daily as needed for moderate constipation. 03/13/20   Ria Bush, MD  sertraline (ZOLOFT) 100 MG tablet Take 1.5 tablets (150 mg total) by mouth daily. 05/25/21   Ria Bush, MD  thiamine 100 MG tablet Take 1 tablet (100 mg total) by mouth daily. 09/12/19   Aline August, MD  vitamin B-12 (CYANOCOBALAMIN) 1000 MCG tablet Take 1 tablet (1,000 mcg total) by mouth daily. 06/06/20   Ria Bush, MD    Allergies Terbinafine and related, Clarithromycin, and Imitrex [sumatriptan]  Family History  Problem Relation Age of Onset   Heart disease  Mother    Hypertension Mother    Hypertension Father    COPD Father    Hearing loss Maternal Grandmother    Hypertension Maternal Grandmother    Hearing loss Maternal Grandfather    Hypertension Maternal Grandfather    Colon cancer Paternal Grandmother     Social History Social History   Tobacco Use   Smoking status: Every Day    Packs/day: 1.00    Years: 51.00    Pack years: 51.00    Types: Cigarettes   Smokeless tobacco: Never   Tobacco comments:    1PPD 11/22/2020  Vaping Use   Vaping Use: Never used  Substance Use Topics   Alcohol use: Yes    Comment: occasional   Drug use: No    Review of Systems Constitutional: No fever/chills Eyes: No visual changes. ENT: No sore throat.  Endorses productive cough Cardiovascular: Denies chest pain. Respiratory: Endorses shortness of breath. Gastrointestinal: No abdominal pain.  No nausea, no vomiting.  No diarrhea. Genitourinary: Negative for dysuria. Musculoskeletal: Negative for acute arthralgias Skin: Negative for rash. Neurological: Negative for headaches, weakness/numbness/paresthesias in any extremity Psychiatric: Negative for suicidal ideation/homicidal ideation   ____________________________________________   PHYSICAL EXAM:  VITAL SIGNS: ED Triage Vitals  Enc Vitals Group     BP 05/26/21 2006 93/70     Pulse Rate 05/26/21 2006 (!) 113     Resp 05/26/21 2006 (!) 21     Temp 05/26/21 2006 98.1 F (36.7 C)     Temp Source 05/26/21 2006 Oral     SpO2 05/26/21 2006 98 %     Weight 05/26/21 2007 104 lb (47.2 kg)     Height 05/26/21 2007 5\' 4"  (1.626 m)     Head Circumference --      Peak Flow --      Pain Score 05/26/21 2007 3     Pain Loc --      Pain Edu? --      Excl. in [place]? --    Constitutional: Asleep in stretcher on my arrival and awaken only to voice.  Oriented x4.  Chronically ill appearing elderly Caucasian female in no acute distress. Eyes: Conjunctivae are normal. PERRL. Head:  Atraumatic. Nose: No congestion/rhinnorhea. Mouth/Throat: Mucous membranes are moist. Neck: No stridor Cardiovascular: Grossly normal heart sounds.  Good peripheral circulation. Respiratory: Normal respiratory effort.  No retractions. Gastrointestinal: Soft and nontender. No distention. Musculoskeletal: No obvious deformities Neurologic:  Normal speech and language. No gross focal neurologic deficits are appreciated. Skin:  Skin is warm and dry. No rash noted. Psychiatric: Mood  and affect are normal. Speech and behavior are normal. ____________________________________________   LABS (all labs ordered are listed, but only abnormal results are displayed)  Labs Reviewed  COMPREHENSIVE METABOLIC PANEL - Abnormal; Notable for the following components:      Result Value   Chloride 94 (*)    CO2 36 (*)    Glucose, Bld 138 (*)    BUN 7 (*)    Creatinine, Ser 0.42 (*)    Calcium 8.8 (*)    All other components within normal limits  CBC WITH DIFFERENTIAL/PLATELET - Abnormal; Notable for the following components:   Hemoglobin 10.9 (*)    HCT 34.9 (*)    All other components within normal limits  RESP PANEL BY RT-PCR (FLU A&B, COVID) ARPGX2  LACTIC ACID, PLASMA  TROPONIN I (HIGH SENSITIVITY)   ____________________________________________  EKG  ED ECG REPORT I, Naaman Plummer, the attending physician, personally viewed and interpreted this ECG.  Date: 05/26/2021 EKG Time: 2008 Rate: 114 Rhythm: Tachycardic sinus rhythm QRS Axis: normal Intervals: normal ST/T Wave abnormalities: normal Narrative Interpretation: Tachycardic sinus rhythm no evidence of acute ischemia  ____________________________________________  RADIOLOGY  ED MD interpretation: Chest x-ray shows chronic emphysema and upper lobe predominant scarring without any acute airspace disease  Official radiology report(s): DG Chest 2 View  Result Date: 05/26/2021 CLINICAL DATA:  Short of breath, cough, COPD EXAM:  CHEST - 2 VIEW COMPARISON:  05/13/2021 FINDINGS: Frontal and lateral views of the chest demonstrate a stable cardiac silhouette. Chronic emphysema and upper lobe predominant scarring. No acute airspace disease, effusion, or pneumothorax. No acute bony abnormalities. IMPRESSION: 1. Chronic emphysema and upper lobe predominant scarring. No acute airspace disease. Electronically Signed   By: Randa Ngo M.D.   On: 05/26/2021 20:28    ____________________________________________   PROCEDURES  Procedure(s) performed (including Critical Care):  .1-3 Lead EKG Interpretation Performed by: Naaman Plummer, MD Authorized by: Naaman Plummer, MD     Interpretation: abnormal     ECG rate:  102   ECG rate assessment: tachycardic     Rhythm: sinus tachycardia     Ectopy: none     Conduction: normal     ____________________________________________   INITIAL IMPRESSION / ASSESSMENT AND PLAN / ED COURSE  As part of my medical decision making, I reviewed the following data within the electronic medical record, if available:  Nursing notes reviewed and incorporated, Labs reviewed, EKG interpreted, Old chart reviewed, Radiograph reviewed and Notes from prior ED visits reviewed and incorporated        The patient appears to be suffering from a moderate exacerbation of COPD.  Based on the history, exam, CXR/EKG, and further workup I dont suspect any other emergent cause of this presentation, such as pneumonia, acute coronary syndrome, congestive heart failure, pulmonary embolism, or pneumothorax.  ED Interventions: bronchodilators, steroids, antibiotics, reassess  2151 Reassessment: After treatment, the patients shortness of breath is resolved, and their lung exam has returned to baseline. They are comfortable and want to go home.  Rx: Steroids, Antibiotics, Albuterol Disposition: Discharge home with SRP. PCP follow up recommended in next 48hours.       ____________________________________________   FINAL CLINICAL IMPRESSION(S) / ED DIAGNOSES  Final diagnoses:  COPD exacerbation ([place])  Shortness of breath     ED Discharge Orders          Ordered    predniSONE (DELTASONE) 50 MG tablet  Daily with breakfast        05/26/21 2152  Note:  This document was prepared using Dragon voice recognition software and may include unintentional dictation errors.    Naaman Plummer, MD 05/27/21 [id number]

## 2021-05-27 ENCOUNTER — Telehealth: Payer: Self-pay

## 2021-05-27 NOTE — Telephone Encounter (Signed)
(  2:30 pm)  SW attempted to call this patient to assess any needs and extend support. SW did not receive an answer and was unable to leave a message for this patient/family.

## 2021-05-28 ENCOUNTER — Telehealth: Payer: Self-pay | Admitting: Student

## 2021-05-28 NOTE — Progress Notes (Deleted)
Subjective:   Christine Serrano is a 60 y.o. female who presents for Medicare Annual (Subsequent) preventive examination.  I connected with Christine Serrano today by telephone and verified that I am speaking with the correct person using two identifiers. Location patient: home Location provider: work Persons participating in the virtual visit: patient, Marine scientist.    I discussed the limitations, risks, security and privacy concerns of performing an evaluation and management service by telephone and the availability of in person appointments. I also discussed with the patient that there may be a patient responsible charge related to this service. The patient expressed understanding and verbally consented to this telephonic visit.    Interactive audio and video telecommunications were attempted between this provider and patient, however failed, due to patient having technical difficulties OR patient did not have access to video capability.  We continued and completed visit with audio only.  Some vital signs may be absent or patient reported.   Time Spent with patient on telephone encounter: *** minutes  Review of Systems           Objective:    There were no vitals filed for this visit. There is no height or weight on file to calculate BMI.  Advanced Directives 05/26/2021 05/13/2021 10/17/2020 05/26/2020 01/08/2020 12/11/2019 12/10/2019  Does Patient Have a Medical Advance Directive? Yes No Yes Yes No No No  Type of Advance Directive - - Out of facility DNR (pink MOST or yellow form) Christine Serrano;Living will - - -  Does patient want to make changes to medical advance directive? - - No - Patient declined - - - -  Copy of [place] in Chart? - - - Yes - validated most recent copy scanned in chart (See row information) - - -  Would patient like information on creating a medical advance directive? - No - Patient declined - - No - Patient declined No - Patient  declined -  Pre-existing out of facility DNR order (yellow form or pink MOST form) - - Pink Most/Yellow Form available - Physician notified to receive inpatient order - - - -    Current Medications (verified) Outpatient Encounter Medications as of 05/29/2021  Medication Sig   albuterol (PROVENTIL) (2.5 MG/3ML) 0.083% nebulizer solution Take 3 mLs (2.5 mg total) by nebulization every 6 (six) hours as needed for wheezing or shortness of breath.   albuterol (VENTOLIN HFA) 108 (90 Base) MCG/ACT inhaler USE 2 INHALATIONS BY MOUTH  EVERY 6 HOURS AS NEEDED FOR WHEEZING OR SHORTNESS OF  BREATH   amoxicillin-clavulanate (AUGMENTIN) 875-125 MG tablet Take 1 tablet by mouth 2 (two) times daily. One po bid x 7 days   Ascorbic Acid (VITAMIN C WITH ROSE HIPS) 500 MG tablet Take 500 mg by mouth daily.   aspirin EC 81 MG tablet Take 81 mg by mouth daily.   benzonatate (TESSALON) 100 MG capsule Take 1 capsule (100 mg total) by mouth 3 (three) times daily as needed for cough.   Budeson-Glycopyrrol-Formoterol (BREZTRI AEROSPHERE) 160-9-4.8 MCG/ACT AERO Inhale 2 puffs into the lungs in the morning and at bedtime.   buPROPion (WELLBUTRIN XL) 150 MG 24 hr tablet Take 1 tablet (150 mg total) by mouth daily.   cholecalciferol (VITAMIN D3) 25 MCG (1000 UNIT) tablet Take 1,000 Units by mouth daily.   fluticasone (FLONASE) 50 MCG/ACT nasal spray Place 2 sprays into both nostrils daily.   folic acid (FOLVITE) 1 MG tablet Take 1 tablet (1 mg total) by  mouth daily.   gabapentin (NEURONTIN) 100 MG capsule Take 1 capsule (100 mg total) by mouth 2 (two) times daily.   hydrOXYzine (ATARAX/VISTARIL) 25 MG tablet Take 1 tablet (25 mg total) by mouth 2 (two) times daily as needed for anxiety.   LINZESS 290 MCG CAPS capsule Take 1 capsule (290 mcg total) by mouth daily as needed (constipation). TAKE 1 CAPSULE EVERY DAY AS NEEDED FOR CONSTIPATION AS DIRECTED   meclizine (ANTIVERT) 25 MG tablet Take 1 tablet (25 mg total) by mouth 3  (three) times daily as needed for dizziness.   methocarbamol (ROBAXIN) 500 MG tablet TAKE 1 TABLET BY MOUTH 3 TIMES DAILY AS NEEDED FOR MUSCLE SPASMS (SEDATION PRECAUTIONS).   mirtazapine (REMERON) 7.5 MG tablet Take 1 tablet (7.5 mg total) by mouth at bedtime.   Multiple Vitamin (MULTIVITAMIN WITH MINERALS) TABS tablet Take 1 tablet by mouth daily.   naproxen (NAPROSYN) 375 MG tablet TAKE 1 TABLET BY MOUTH TWICE A DAY AS NEEDED FOR PAIN WITH FOOD   omeprazole (PRILOSEC) 40 MG capsule Take 1 capsule (40 mg total) by mouth daily.   OXYGEN Inhale 4 L/min into the lungs continuous.    polyethylene glycol powder (GLYCOLAX/MIRALAX) 17 GM/SCOOP powder Take 17 g by mouth daily as needed for moderate constipation.   predniSONE (DELTASONE) 50 MG tablet Take 1 tablet (50 mg total) by mouth daily with breakfast for 5 days.   sertraline (ZOLOFT) 100 MG tablet Take 1.5 tablets (150 mg total) by mouth daily.   thiamine 100 MG tablet Take 1 tablet (100 mg total) by mouth daily.   vitamin B-12 (CYANOCOBALAMIN) 1000 MCG tablet Take 1 tablet (1,000 mcg total) by mouth daily.   No facility-administered encounter medications on file as of 05/29/2021.    Allergies (verified) Terbinafine and related, Clarithromycin, and Imitrex [sumatriptan]   History: Past Medical History:  Diagnosis Date   Cervical cancer ([place])    Chronic idiopathic constipation    Community acquired pneumonia of right upper lobe of lung 12/10/2016   COPD (chronic obstructive pulmonary disease) ([place])    Depression    Endometriosis    HLD (hyperlipidemia)    Smoker    Past Surgical History:  Procedure Laterality Date   ABLATION ON ENDOMETRIOSIS     APPENDECTOMY     BREAST EXCISIONAL BIOPSY Left age 32   benign biopsy - chronic fatty deposit   COLONOSCOPY WITH PROPOFOL N/A 08/04/2017   TAx3, angiodysplastic lesion, diverticulosis, rpt 5 yrs ([place])   EXPLORATORY LAPAROTOMY  1992   endometriosis   FOOT SURGERY Right    needle  imbeded   LASER ABLATION CONDYLOMA CERVICAL / VULVAR     NASAL SEPTUM SURGERY Bilateral    OVARIAN CYST SURGERY Bilateral 1980   remote   SALPINGOOPHORECTOMY Right 1983   ectopic pregnancy   TONSILLECTOMY     Family History  Problem Relation Age of Onset   Heart disease Mother    Hypertension Mother    Hypertension Father    COPD Father    Hearing loss Maternal Grandmother    Hypertension Maternal Grandmother    Hearing loss Maternal Grandfather    Hypertension Maternal Grandfather    Colon cancer Paternal Grandmother    Social History   Socioeconomic History   Marital status: Legally Separated    Spouse name: Not on file   Number of children: 0   Years of education: Not on file   Highest education level: Not on file  Occupational History   Occupation:  disabled  Tobacco Use   Smoking status: Every Day    Packs/day: 1.00    Years: 51.00    Pack years: 51.00    Types: Cigarettes   Smokeless tobacco: Never   Tobacco comments:    1PPD 11/22/2020  Vaping Use   Vaping Use: Never used  Substance and Sexual Activity   Alcohol use: Yes    Comment: occasional   Drug use: No   Sexual activity: Never  Other Topics Concern   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 No         Activities of Daily Living No flowsheet data found.  Patient Care Team: Ria Bush, MD as PCP -  General (Family Medicine)  Indicate any recent Medical Services you may have received from other than [facility] providers in the past year (date may be approximate).     Assessment:   This is a routine wellness examination for [place].  Hearing/Vision screen No results found.  Dietary issues and exercise activities discussed:     Goals Addressed   None    Depression Screen PHQ 2/9 Scores 05/26/2020 02/25/2019 08/13/2018 07/20/2018 03/09/2018 02/06/2018 08/18/2017  PHQ - 2 Score 6 2 4 6 5 4 1   PHQ- 9 Score 12 10 14 20 16 10 4     Fall Risk Fall Risk  05/26/2020 02/25/2019 03/09/2018 02/06/2018 02/05/2017  Falls in the past year? 0 1 No No No  Number falls in past yr: 0 1 - - -  Injury with Fall? 0 1 - - -  Risk for fall due to : Medication side effect - - - -  Follow up Falls evaluation completed;Falls prevention discussed - - - -    FALL RISK PREVENTION PERTAINING TO THE HOME:  Any stairs in or around the home? {YES/NO:21197} If so, are there any without handrails? {YES/NO:21197} Home free of loose throw rugs in walkways, pet beds, electrical cords, etc? {YES/NO:21197} Adequate lighting in your home to reduce risk of falls? {YES/NO:21197}  ASSISTIVE DEVICES UTILIZED TO PREVENT FALLS:  Life alert? {YES/NO:21197} Use of a cane, walker or w/c? {YES/NO:21197} Grab bars in the bathroom? {YES/NO:21197} Shower chair or bench in shower? {YES/NO:21197} Elevated toilet seat or a handicapped toilet? {YES/NO:21197}  TIMED UP AND GO:  Was the test performed? No , visit completed over the phone.    Cognitive Function: MMSE - Mini Mental State Exam 05/26/2020 02/06/2018 02/05/2017  Orientation to time 5 5 5   Orientation to Place 5 5 5   Registration 3 3 3   Attention/ Calculation 5 0 0  Recall 2 3 3   Language- name 2 objects - 0 0  Language- repeat 1 1 1   Language- follow 3 step command - 3 3  Language- read & follow direction - 0 0  Write a sentence - 0 0  Copy design - 0 0  Total score -  20 20        Immunizations Immunization History  Administered Date(s) Administered   Influenza Split 04/17/2016   Influenza,inj,Quad PF,6+ Mos 02/21/2017, 03/17/2018, 02/25/2019, 03/13/2020, 05/18/2021   Pneumococcal Conjugate-13 05/11/2018   Pneumococcal Polysaccharide-23 06/17/2008   Zoster Recombinat (Shingrix) 08/30/2020    TDAP status: Due, Education has been provided regarding the importance of this vaccine. Advised may receive this vaccine at local pharmacy or Health Dept. Aware to provide a copy of the vaccination record if obtained from local pharmacy or Health Dept. Verbalized acceptance and understanding.  Flu Vaccine status: Up to date  Pneumococcal vaccine status: Up to date  {Covid-19 vaccine status:2101808}  Qualifies for Shingles Vaccine? Yes   Zostavax completed No   Shingrix completed yes, 1st dose completed    Screening Tests Health Maintenance  Topic Date Due   PAP SMEAR-Modifier  02/22/2019   MAMMOGRAM  05/20/2019   Zoster Vaccines- Shingrix (2 of 2) 10/25/2020   COVID-19 Vaccine (1) 06/11/2021 (Originally 07/07/1961)   Pneumococcal Vaccine 48-31 Years old (3 - PPSV23 if available, else PCV20) 11/22/2021 (Originally 05/12/2019)   TETANUS/TDAP  02/06/2027 (Originally [birth year]1978)   COLONOSCOPY (Pts 45-5yrs Insurance coverage will need to be confirmed)  08/04/2022   INFLUENZA VACCINE  Completed   Hepatitis C Screening  Completed   HIV Screening  Completed   HPV VACCINES  Aged Out    Health Maintenance  Health Maintenance Due  Topic Date Due   PAP SMEAR-Modifier  02/22/2019   MAMMOGRAM  05/20/2019   Zoster Vaccines- Shingrix (2 of 2) 10/25/2020    Colorectal cancer screening: Type of screening: Colonoscopy. Completed 08/04/17. Repeat every 5 years  {Mammogram status:21018020}  Bone Density Status: Patient not yet eligible, due to age  Lung Cancer Screening: (Low Dose CT Chest recommended if Age 48-80 years, 30 pack-year currently smoking OR  have quit w/in 15years.) does qualify.   Lung Cancer Screening Referral: ***  Additional Screening:  Hepatitis C Screening: does qualify; Completed 02/05/18  Vision Screening: Recommended annual ophthalmology exams for early detection of glaucoma and other disorders of the eye. Is the patient up to date with their annual eye exam?  {YES/NO:21197} Who is the provider or what is the name of the office in which the patient attends annual eye exams? *** If pt is not established with a provider, would they like to be referred to a provider to establish care? {YES/NO:21197}.   Dental Screening: Recommended annual dental exams for proper oral hygiene  Community Resource Referral / Chronic Care Management: CRR required this visit?  {YES/NO:21197}  CCM required this visit?  {YES/NO:21197}     Plan:     I have personally reviewed and noted the following in the patient's chart:   Medical and social history Use of alcohol, tobacco or illicit drugs  Current medications and supplements including opioid prescriptions.  Functional ability and status Nutritional status Physical activity Advanced directives List of other physicians Hospitalizations, surgeries, and ER visits in previous 12 months Vitals Screenings to include cognitive, depression, and falls Referrals and appointments  In addition, I have reviewed and discussed with patient certain preventive protocols, quality metrics, and best practice recommendations. A written personalized care plan for preventive services as well as general preventive health recommendations were provided to patient.   Due to this being a telephonic visit, the after visit summary with patients personalized plan was offered to patient via mail or my-chart. ***Patient declined at  this time./ Patient would like to access on my-chart/ per request, patient was mailed a copy of AVS./ Patient preferred to pick up at office at next visit.   Loma Messing,  LPN   [id number]   Nurse Health Advisor  Nurse Notes: none

## 2021-05-28 NOTE — Telephone Encounter (Signed)
Rec'd message from On-Call Triage nurse over the weekend that patient had called regarding a visit from Palliative NP.  I attempted to contact patient to schedule a f/u visit with no answer and requested a return call to schedule visit.  I also attempted to contact patient's listed roommate, Angie and left a message requesting a return call to confirm patient's number and I also told her that I was trying to schedule a visit with her.

## 2021-05-29 ENCOUNTER — Telehealth: Payer: Self-pay

## 2021-05-29 ENCOUNTER — Telehealth: Payer: Self-pay | Admitting: Student

## 2021-05-29 ENCOUNTER — Ambulatory Visit: Payer: Medicare Other

## 2021-05-29 MED ORDER — ALBUTEROL SULFATE HFA 108 (90 BASE) MCG/ACT IN AERS: 2.0000 | INHALATION_SPRAY | Freq: Four times a day (QID) | RESPIRATORY_TRACT | 6 refills | Status: DC | PRN

## 2021-05-29 NOTE — Telephone Encounter (Signed)
New Rx sent to pharmacy for albuterol HFA (Proair).

## 2021-05-29 NOTE — Telephone Encounter (Signed)
Made several attempts to reach patient in regards to annual wellness visit today @ 8:15am. Left VM advising patient to contact office to reschedule when available. TM

## 2021-05-29 NOTE — Telephone Encounter (Signed)
Received fax from [facility] stating that Albuterol (Ventolin HFA) needs PA. Albuterol HFA (proair) and Proair respiclick does not.  Ok to change or proceed with PA?

## 2021-05-29 NOTE — Telephone Encounter (Signed)
Ret'd call to patient and have scheduled a Palliative f/u visit for 06/07/21 @ 10 AM.

## 2021-05-30 ENCOUNTER — Encounter: Payer: Self-pay | Admitting: Pulmonary Disease

## 2021-05-30 ENCOUNTER — Other Ambulatory Visit: Payer: Self-pay

## 2021-05-30 ENCOUNTER — Ambulatory Visit (INDEPENDENT_AMBULATORY_CARE_PROVIDER_SITE_OTHER): Payer: Medicare Other | Admitting: Pulmonary Disease

## 2021-05-30 VITALS — BP 98/66 | HR 120 | Temp 97.7°F | Ht 64.5 in | Wt 105.8 lb

## 2021-05-30 DIAGNOSIS — F17210 Nicotine dependence, cigarettes, uncomplicated: Secondary | ICD-10-CM

## 2021-05-30 DIAGNOSIS — J9611 Chronic respiratory failure with hypoxia: Secondary | ICD-10-CM

## 2021-05-30 DIAGNOSIS — Z9981 Dependence on supplemental oxygen: Secondary | ICD-10-CM

## 2021-05-30 DIAGNOSIS — J440 Chronic obstructive pulmonary disease with acute lower respiratory infection: Secondary | ICD-10-CM | POA: Diagnosis not present

## 2021-05-30 DIAGNOSIS — J449 Chronic obstructive pulmonary disease, unspecified: Secondary | ICD-10-CM | POA: Diagnosis not present

## 2021-05-30 DIAGNOSIS — J209 Acute bronchitis, unspecified: Secondary | ICD-10-CM

## 2021-05-30 LAB — GYN CYTOLOGY

## 2021-05-30 MED ORDER — AZITHROMYCIN 250 MG PO TABS: ORAL_TABLET | ORAL | 0 refills | Status: AC

## 2021-05-30 MED ORDER — METHYLPREDNISOLONE 4 MG PO TBPK: ORAL_TABLET | ORAL | 0 refills | Status: DC

## 2021-05-30 MED ORDER — BREZTRI AEROSPHERE 160-9-4.8 MCG/ACT IN AERO: 2.0000 | INHALATION_SPRAY | Freq: Two times a day (BID) | RESPIRATORY_TRACT | 3 refills | Status: DC

## 2021-05-30 NOTE — Patient Instructions (Signed)
I have refilled your Breztri and your albuterol.  I sent in prescriptions for Medrol Dosepak and azithromycin, this will treat your bronchitis.  We will see him in follow-up in 3 months time but please let us know if you are not getting any better.

## 2021-05-30 NOTE — Progress Notes (Signed)
Subjective:    Patient ID: Christine Serrano, female    DOB: 1961/05/16, 60 y.o.   MRN: [id number] Chief Complaint  Patient presents with   Follow-up    COPD-   PT PROFILE: 60 year old F recalcitrant smoker with severe, oxygen-dependent COPD hospitalized multiple times for COPD exacerbation initially requiring NIPPV in 2017, former patient of Dr. Alva Garnet.   PROBLEMS: Recalcitrant smoker Chronic hypoxic respiratory failure Very severe COPD/emphysema Chronic bronchitis   DATA: CT chest 03/13/16: Severe emphysema with extensive disease in the upper lobes and superior segment of the right lower lobe. This could represent acute and/or chronic changes. In addition, there is an irregular spiculated density along the right minor fissure which is indeterminate. PFTs 01/15/18: FVC: 1.42 L (48 %pred), FEV1: 0.76 L (31 %pred), FEV1/FVC: 53% , TLC: 4.49 L (94 %pred), DLCO 30 %pred, DLCO/VA 51%. TLC likely underestimated by gas dilution technique. Severe Gas trapping with RV at 172% predicted. CT chest 10/13/2019: Severe emphysema particularly upper lobes with upper lobe predominant scarring and architectural distortion no significant change from November 25, 2016 CT.  Coronary artery calcifications and aortic calcification was noted. Chest x-ray 10/16/2019: Hyperinflated lungs with diffuse interstitial prominence, no acute infiltrate. CT chest 06/07/2020: Severe emphysema, predominantly upper lobes.  Upper lobe predominant scarring and architectural distortion, new right middle lobe atelectasis. PET/CT 06/30/2020: No evidence of malignancy, previously noted right middle lobe atelectasis resolved, likely inflammatory/infectious.   INTERVAL:  Last seen 11/22/2020 by me.  Very end-stage COPD. Prednisone has helped her with appetite.    HPI Christine Serrano is a 60 year old current smoker (1 PPD) with very severe/end-stage COPD and chronic respiratory failure due to the same who presents for routine scheduled visit.   She is on oxygen 24/7 at 2 L/min.  She has continued to smoke and actually has increased her cigarette use from her last visit.  At her prior visit she noted that she had no stamina to cook for herself however she is now enrolled Meals on Wheels.  This seems to help her.  Continues to note that Judithann Sauger helps her with shortness of breath overall.  She has noted over the last 2 to 3 days some increase in shortness of breath and sputum changing from yellow to slight green tinge.  No hemoptysis.  No fevers, chills or sweats.  Review of Systems A 10 point review of systems was performed and it is as noted above otherwise negative.  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 ([place]) 11/22/2020   Elbow laceration, right, initial encounter 10/24/2020   Arm injury, right, initial encounter 10/24/2020   Fall with injury 10/24/2020   Traumatic hematoma of scalp 10/24/2020   MRSA infection 08/23/2020   Dyspnea on exertion 08/23/2020   Abscess of axilla, left 08/14/2020   Low serum vitamin B12 06/09/2020   Cervical neck pain with evidence of disc disease 01/29/2020   Palliative care status 10/23/2019   Impaired mobility 10/20/2019   Chronic respiratory failure with hypoxia, on home O2 therapy ([place]) 10/20/2019   Memory difficulties 10/16/2019   Folate deficiency 09/17/2019   Dizziness 09/11/2019   Anemia 09/11/2019   Hypocalcemia 45/62/5638   Systolic murmur 93/73/4287   Vertebral artery stenosis, left 02/27/2019   Medicare annual wellness visit, subsequent 02/25/2019   Advanced directives, counseling/discussion 02/25/2019   Epistaxis 09/21/2018   Osteoporosis 05/23/2018   Watery diarrhea 05/08/2018   Numbness of right hand 10/10/2017  Benign neoplasm of ascending colon    Benign neoplasm of transverse colon    Benign neoplasm of sigmoid colon    Positive hepatitis C antibody test 02/23/2017   Encounter for general adult medical  examination with abnormal findings 02/21/2017   HLD (hyperlipidemia) 02/21/2017   Anxiety 12/10/2016   Community acquired pneumonia 12/10/2016   Tobacco abuse 11/28/2016   Aortic atherosclerosis ([place]) 11/26/2016   Tachycardia 11/01/2016   Pulmonary nodule 10/25/2016   MDD (major depressive disorder), recurrent episode, severe ([place]) 07/03/2016   Chronic constipation 07/03/2016   Alcohol abuse, in remission 07/03/2016   COPD, very severe ([place]) 07/03/2016   Tremor 07/03/2016   Chronic insomnia 07/03/2016   Social History   Tobacco Use   Smoking status: Every Day    Packs/day: 1.00    Years: 51.00    Pack years: 51.00    Types: Cigarettes   Smokeless tobacco: Never   Tobacco comments:    1PPD 05/30/2021  Substance Use Topics   Alcohol use: Yes    Comment: occasional   Allergies  Allergen Reactions   Terbinafine And Related Diarrhea    Diarrheal illness while on terbinafine ?related to med   Clarithromycin     Caused Thrush    Imitrex [Sumatriptan] Other (See Comments)    Worsens migraine    Current Meds  Medication Sig   albuterol (PROVENTIL) (2.5 MG/3ML) 0.083% nebulizer solution Take 3 mLs (2.5 mg total) by nebulization every 6 (six) hours as needed for wheezing or shortness of breath.   amoxicillin-clavulanate (AUGMENTIN) 875-125 MG tablet Take 1 tablet by mouth 2 (two) times daily. One po bid x 7 days   Ascorbic Acid (VITAMIN C WITH ROSE HIPS) 500 MG tablet Take 500 mg by mouth daily. (Patient not taking: Reported on 06/07/2021)   aspirin EC 81 MG tablet Take 81 mg by mouth daily.   [EXPIRED] azithromycin (ZITHROMAX) 250 MG tablet Take 2 tablets (500 mg) on  Day 1,  followed by 1 tablet (250 mg) once daily on Days 2 through 5.   benzonatate (TESSALON) 100 MG capsule Take 1 capsule (100 mg total) by mouth 3 (three) times daily as needed for cough.   Budeson-Glycopyrrol-Formoterol (BREZTRI AEROSPHERE) 160-9-4.8 MCG/ACT AERO Inhale 2 puffs into the lungs in the morning and  at bedtime.   buPROPion (WELLBUTRIN XL) 150 MG 24 hr tablet Take 1 tablet (150 mg total) by mouth daily.   cholecalciferol (VITAMIN D3) 25 MCG (1000 UNIT) tablet Take 1,000 Units by mouth daily.   fluticasone (FLONASE) 50 MCG/ACT nasal spray Place 2 sprays into both nostrils daily.   folic acid (FOLVITE) 1 MG tablet Take 1 tablet (1 mg total) by mouth daily.   gabapentin (NEURONTIN) 100 MG capsule Take 1 capsule (100 mg total) by mouth 2 (two) times daily.   hydrOXYzine (ATARAX/VISTARIL) 25 MG tablet Take 1 tablet (25 mg total) by mouth 2 (two) times daily as needed for anxiety.   LINZESS 290 MCG CAPS capsule Take 1 capsule (290 mcg total) by mouth daily as needed (constipation). TAKE 1 CAPSULE EVERY DAY AS NEEDED FOR CONSTIPATION AS DIRECTED   meclizine (ANTIVERT) 25 MG tablet Take 1 tablet (25 mg total) by mouth 3 (three) times daily as needed for dizziness.   methocarbamol (ROBAXIN) 500 MG tablet TAKE 1 TABLET BY MOUTH 3 TIMES DAILY AS NEEDED FOR MUSCLE SPASMS (SEDATION PRECAUTIONS).   methylPREDNISolone (MEDROL DOSEPAK) 4 MG TBPK tablet Take as directed in the package.   mirtazapine (REMERON) 7.5  MG tablet Take 1 tablet (7.5 mg total) by mouth at bedtime.   Multiple Vitamin (MULTIVITAMIN WITH MINERALS) TABS tablet Take 1 tablet by mouth daily.   naproxen (NAPROSYN) 375 MG tablet TAKE 1 TABLET BY MOUTH TWICE A DAY AS NEEDED FOR PAIN WITH FOOD   omeprazole (PRILOSEC) 40 MG capsule Take 1 capsule (40 mg total) by mouth daily.   OXYGEN Inhale 4 L/min into the lungs continuous.    polyethylene glycol powder (GLYCOLAX/MIRALAX) 17 GM/SCOOP powder Take 17 g by mouth daily as needed for moderate constipation. (Patient not taking: Reported on 06/07/2021)   sertraline (ZOLOFT) 100 MG tablet Take 1.5 tablets (150 mg total) by mouth daily.   thiamine 100 MG tablet Take 1 tablet (100 mg total) by mouth daily.   vitamin B-12 (CYANOCOBALAMIN) 1000 MCG tablet Take 1 tablet (1,000 mcg total) by mouth daily.  (Patient not taking: Reported on 06/07/2021)   [DISCONTINUED] albuterol (VENTOLIN HFA) 108 (90 Base) MCG/ACT inhaler Inhale 2 puffs into the lungs every 6 (six) hours as needed for wheezing or shortness of breath.   [DISCONTINUED] Budeson-Glycopyrrol-Formoterol (BREZTRI AEROSPHERE) 160-9-4.8 MCG/ACT AERO Inhale 2 puffs into the lungs in the morning and at bedtime.   [DISCONTINUED] predniSONE (DELTASONE) 50 MG tablet Take 1 tablet (50 mg total) by mouth daily with breakfast for 5 days.        Objective:   Physical Exam BP 98/66 (BP Location: Left Arm, Patient Position: Sitting, Cuff Size: Normal)    Pulse (!) 120    Temp 97.7 F (36.5 C) (Oral)    Ht 5' 4.5" (1.638 m)    Wt 105 lb 12.8 oz (48 kg)    LMP  (LMP Unknown)    SpO2 94%    BMI 17.88 kg/m  GENERAL: Thin/cachectic woman, debilitated, looks older than stated age, presents in transport chair. HEAD: Normocephalic, atraumatic.  EYES: Pupils equal, round, reactive to light.  No scleral icterus.  MOUTH: Nose/mouth/throat not examined due to masking requirements for COVID 19. NECK: Supple. No thyromegaly. Trachea midline. No JVD.  No adenopathy. PULMONARY: Distant breath sounds.  Rhonchi, occasional end expiratory wheeze. CARDIOVASCULAR: S1 and S2. Regular rate and rhythm.  No rubs, murmurs or gallops heard. ABDOMEN: Scaphoid, otherwise benign.   MUSCULOSKELETAL: No joint deformity, no clubbing, no edema.  NEUROLOGIC: No overt focal deficit.  Speech is fluent.  Gait not observed.   SKIN: Intact,warm,dry.  On limited exam no rashes. PSYCH: Mood and behavior normal.      Assessment & Plan:     ICD-10-CM   1. COPD, very severe ([place])  J44.9    Continue Breztri 2 puffs twice a day Continue as needed albuterol via neb or MDI    2. Acute bronchitis with COPD ([place])  J44.0    J20.9    Appears to have an acute bronchitis Medrol Dosepak Azithromycin Z-Pak Trying to avoid major exacerbation    3. Chronic respiratory failure with hypoxia,  on home O2 therapy ([facility])  J96.11    Z99.81    Continue oxygen at current level Patient compliant    4. Tobacco dependence due to cigarettes  F17.210    She was counseled regards to discontinuation of smoking     Meds ordered this encounter  Medications   methylPREDNISolone (MEDROL DOSEPAK) 4 MG TBPK tablet    Sig: Take as directed in the package.    Dispense:  21 tablet    Refill:  0   azithromycin (ZITHROMAX) 250 MG tablet  Sig: Take 2 tablets (500 mg) on  Day 1,  followed by 1 tablet (250 mg) once daily on Days 2 through 5.    Dispense:  6 each    Refill:  0   Budeson-Glycopyrrol-Formoterol (BREZTRI AEROSPHERE) 160-9-4.8 MCG/ACT AERO    Sig: Inhale 2 puffs into the lungs in the morning and at bedtime.    Dispense:  32.1 g    Refill:  3   We will see the patient in follow-up in 3 months time she is to contact us prior to that time should any new difficulties arise or if her symptoms fail to improve.  Renold Don, MD Advanced Bronchoscopy P[facility]    *This note was dictated using voice recognition software/Dragon.  Despite best efforts to proofread, errors can occur which can change the meaning. Any transcriptional errors that result from this process are unintentional and may not be fully corrected at the time of dictation.

## 2021-05-30 NOTE — Telephone Encounter (Signed)
noted 

## 2021-06-01 ENCOUNTER — Other Ambulatory Visit: Payer: Medicare Other

## 2021-06-03 ENCOUNTER — Other Ambulatory Visit: Payer: Self-pay | Admitting: Family Medicine

## 2021-06-05 NOTE — Progress Notes (Signed)
Subjective:   Christine Serrano is a 60 y.o. female who presents for Medicare Annual (Subsequent) preventive examination.  I connected with Christine Serrano today by telephone and verified that I am speaking with the correct person using two identifiers. Location patient: home Location provider: work Persons participating in the virtual visit: patient, Marine scientist.    I discussed the limitations, risks, security and privacy concerns of performing an evaluation and management service by telephone and the availability of in person appointments. I also discussed with the patient that there may be a patient responsible charge related to this service. The patient expressed understanding and verbally consented to this telephonic visit.    Interactive audio and video telecommunications were attempted between this provider and patient, however failed, due to patient having technical difficulties OR patient did not have access to video capability.  We continued and completed visit with audio only.  Some vital signs may be absent or patient reported.   Time Spent with patient on telephone encounter: 20 minutes  Review of Systems     Cardiac Risk Factors include: advanced age (>30men, >51 women);dyslipidemia     Objective:    Today's Vitals   06/06/21 0943  Weight: 105 lb (47.6 kg)  Height: 5\' 4"  (1.626 m)   Body mass index is 18.02 kg/m.  Advanced Directives 06/06/2021 05/26/2021 05/13/2021 10/17/2020 05/26/2020 01/08/2020 12/11/2019  Does Patient Have a Medical Advance Directive? Yes Yes No Yes Yes No No  Type of Paramedic of [place];Living will - - Out of facility DNR (pink MOST or yellow form) [place];Living will - -  Does patient want to make changes to medical advance directive? Yes (MAU/Ambulatory/Procedural Areas - Information given) - - No - Patient declined - - -  Copy of [place] in Chart? Yes - validated most recent copy  scanned in chart (See row information) - - - Yes - validated most recent copy scanned in chart (See row information) - -  Would patient like information on creating a medical advance directive? - - No - Patient declined - - No - Patient declined No - Patient declined  Pre-existing out of facility DNR order (yellow form or pink MOST form) - - - Pink Most/Yellow Form available - Physician notified to receive inpatient order - - -    Current Medications (verified) Outpatient Encounter Medications as of 06/06/2021  Medication Sig   albuterol (PROVENTIL) (2.5 MG/3ML) 0.083% nebulizer solution Take 3 mLs (2.5 mg total) by nebulization every 6 (six) hours as needed for wheezing or shortness of breath.   albuterol (VENTOLIN HFA) 108 (90 Base) MCG/ACT inhaler TAKE 2 PUFFS BY MOUTH EVERY 6 HOURS AS NEEDED FOR WHEEZE OR SHORTNESS OF BREATH   amoxicillin-clavulanate (AUGMENTIN) 875-125 MG tablet Take 1 tablet by mouth 2 (two) times daily. One po bid x 7 days   Ascorbic Acid (VITAMIN C WITH ROSE HIPS) 500 MG tablet Take 500 mg by mouth daily.   aspirin EC 81 MG tablet Take 81 mg by mouth daily.   benzonatate (TESSALON) 100 MG capsule Take 1 capsule (100 mg total) by mouth 3 (three) times daily as needed for cough.   Budeson-Glycopyrrol-Formoterol (BREZTRI AEROSPHERE) 160-9-4.8 MCG/ACT AERO Inhale 2 puffs into the lungs in the morning and at bedtime.   buPROPion (WELLBUTRIN XL) 150 MG 24 hr tablet Take 1 tablet (150 mg total) by mouth daily.   cholecalciferol (VITAMIN D3) 25 MCG (1000 UNIT) tablet Take 1,000 Units by  mouth daily.   fluticasone (FLONASE) 50 MCG/ACT nasal spray Place 2 sprays into both nostrils daily.   folic acid (FOLVITE) 1 MG tablet Take 1 tablet (1 mg total) by mouth daily.   gabapentin (NEURONTIN) 100 MG capsule Take 1 capsule (100 mg total) by mouth 2 (two) times daily.   hydrOXYzine (ATARAX/VISTARIL) 25 MG tablet Take 1 tablet (25 mg total) by mouth 2 (two) times daily as needed for  anxiety.   LINZESS 290 MCG CAPS capsule Take 1 capsule (290 mcg total) by mouth daily as needed (constipation). TAKE 1 CAPSULE EVERY DAY AS NEEDED FOR CONSTIPATION AS DIRECTED   meclizine (ANTIVERT) 25 MG tablet Take 1 tablet (25 mg total) by mouth 3 (three) times daily as needed for dizziness.   methocarbamol (ROBAXIN) 500 MG tablet TAKE 1 TABLET BY MOUTH 3 TIMES DAILY AS NEEDED FOR MUSCLE SPASMS (SEDATION PRECAUTIONS).   methylPREDNISolone (MEDROL DOSEPAK) 4 MG TBPK tablet Take as directed in the package.   mirtazapine (REMERON) 7.5 MG tablet Take 1 tablet (7.5 mg total) by mouth at bedtime.   Multiple Vitamin (MULTIVITAMIN WITH MINERALS) TABS tablet Take 1 tablet by mouth daily.   naproxen (NAPROSYN) 375 MG tablet TAKE 1 TABLET BY MOUTH TWICE A DAY AS NEEDED FOR PAIN WITH FOOD   omeprazole (PRILOSEC) 40 MG capsule Take 1 capsule (40 mg total) by mouth daily.   OXYGEN Inhale 4 L/min into the lungs continuous.    polyethylene glycol powder (GLYCOLAX/MIRALAX) 17 GM/SCOOP powder Take 17 g by mouth daily as needed for moderate constipation.   sertraline (ZOLOFT) 100 MG tablet Take 1.5 tablets (150 mg total) by mouth daily.   thiamine 100 MG tablet Take 1 tablet (100 mg total) by mouth daily.   vitamin B-12 (CYANOCOBALAMIN) 1000 MCG tablet Take 1 tablet (1,000 mcg total) by mouth daily.   No facility-administered encounter medications on file as of 06/06/2021.    Allergies (verified) Terbinafine and related, Clarithromycin, and Imitrex [sumatriptan]   History: Past Medical History:  Diagnosis Date   Cervical cancer ([place])    Chronic idiopathic constipation    Community acquired pneumonia of right upper lobe of lung 12/10/2016   COPD (chronic obstructive pulmonary disease) ([place])    Depression    Endometriosis    HLD (hyperlipidemia)    Smoker    Past Surgical History:  Procedure Laterality Date   ABLATION ON ENDOMETRIOSIS     APPENDECTOMY     BREAST EXCISIONAL BIOPSY Left age 39    benign biopsy - chronic fatty deposit   COLONOSCOPY WITH PROPOFOL N/A 08/04/2017   TAx3, angiodysplastic lesion, diverticulosis, rpt 5 yrs ([place])   EXPLORATORY LAPAROTOMY  1992   endometriosis   FOOT SURGERY Right    needle imbeded   LASER ABLATION CONDYLOMA CERVICAL / VULVAR     NASAL SEPTUM SURGERY Bilateral    OVARIAN CYST SURGERY Bilateral 1980   remote   SALPINGOOPHORECTOMY Right 1983   ectopic pregnancy   TONSILLECTOMY     Family History  Problem Relation Age of Onset   Heart disease Mother    Hypertension Mother    Hypertension Father    COPD Father    Hearing loss Maternal Grandmother    Hypertension Maternal Grandmother    Hearing loss Maternal Grandfather    Hypertension Maternal Grandfather    Colon cancer Paternal Grandmother    Social History   Socioeconomic History   Marital status: Legally Separated    Spouse name: Not on file  Number of children: 0   Years of education: Not on file   Highest education level: Not on file  Occupational History   Occupation: disabled  Tobacco Use   Smoking status: Every Day    Packs/day: 1.00    Years: 51.00    Pack years: 51.00    Types: Cigarettes   Smokeless tobacco: Never   Tobacco comments:    1PPD 05/30/2021  Vaping Use   Vaping Use: Never used  Substance and Sexual Activity   Alcohol use: Yes    Comment: occasional   Drug use: No   Sexual activity: Never  Other Topics Concern   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)   Activity: limited by dyspnea   Diet: some water daily, fruits/vegetables daily   Social Determinants of Health   Financial Resource Strain: Low Risk    Difficulty of Paying Living Expenses: Not hard at all  Food Insecurity: No Food Insecurity   Worried About Charity fundraiser in the Last Year: Never true   [place] in the Last Year: Never true  Transportation  Needs: No Transportation Needs   Lack of Transportation (Medical): No   Lack of Transportation (Non-Medical): No  Physical Activity: Inactive   Days of Exercise per Week: 0 days   Minutes of Exercise per Session: 0 min  Stress: No Stress Concern Present   Feeling of Stress : Not at all  Social Connections: Socially Isolated   Frequency of Communication with Friends and Family: More than three times a week   Frequency of Social Gatherings with Friends and Family: More than three times a week   Attends Religious Services: Never   Marine scientist or Organizations: No   Attends Archivist Meetings: Never   Marital Status: Separated    Tobacco Counseling Ready to quit: Not Answered Counseling given: Not Answered Tobacco comments: 1PPD 05/30/2021   Clinical Intake:  Pre-visit preparation completed: Yes  Pain : No/denies pain     BMI - recorded: 18.02 Nutritional Status: BMI <19  Underweight Nutritional Risks: None Diabetes: No  How often do you need to have someone help you when you read instructions, pamphlets, or other written materials from your doctor or pharmacy?: 1 - Never  Diabetic? No  Interpreter Needed?: No  Information entered by :: Orrin Brigham LPN   Activities of Daily Living In your present state of health, do you have any difficulty performing the following activities: 06/06/2021  Hearing? N  Vision? N  Difficulty concentrating or making decisions? N  Walking or climbing stairs? Y  Dressing or bathing? N  Doing errands, shopping? Y  Comment has Agricultural engineer and eating ? N  Using the Toilet? N  In the past six months, have you accidently leaked urine? N  Do you have problems with loss of bowel control? N  Managing your Medications? N  Managing your Finances? N  Housekeeping or managing your Housekeeping? N  Some recent data might be hidden    Patient Care Team: Ria Bush, MD as PCP - General (Family  Medicine)  Indicate any recent Medical Services you may have received from other than [facility] providers in the past year (date may be approximate).     Assessment:   This is a routine wellness examination for Alin.  Hearing/Vision screen Hearing Screening - Comments::  No issues Vision Screening - Comments:: Last exam 03/2021, [facility], cataract surgery  Dietary issues and exercise activities discussed: Current Exercise Habits: The patient does not participate in regular exercise at present   Goals Addressed             This Visit's Progress    Patient Stated       Would like to start eating healthier       Depression Screen PHQ 2/9 Scores 06/06/2021 05/26/2020 02/25/2019 08/13/2018 07/20/2018 03/09/2018 02/06/2018  PHQ - 2 Score 0 6 2 4 6 5 4   PHQ- 9 Score - 12 10 14 20 16 10     Fall Risk Fall Risk  06/06/2021 05/26/2020 02/25/2019 03/09/2018 02/06/2018  Falls in the past year? 1 0 1 No No  Number falls in past yr: 0 0 1 - -  Injury with Fall? 1 0 1 - -  Comment concussion and stitches - - - -  Risk for fall due to : Impaired balance/gait Medication side effect - - -  Follow up Falls prevention discussed Falls evaluation completed;Falls prevention discussed - - -    FALL RISK PREVENTION PERTAINING TO THE HOME:  Any stairs in or around the home? No  If so, are there any without handrails? No  Home free of loose throw rugs in walkways, pet beds, electrical cords, etc? Yes  Adequate lighting in your home to reduce risk of falls? Yes   ASSISTIVE DEVICES UTILIZED TO PREVENT FALLS:  Life alert? No  Use of a cane, walker or w/c? Yes , wheel chair Grab bars in the bathroom? Yes  Shower chair or bench in shower? No  Elevated toilet seat or a handicapped toilet? No   TIMED UP AND GO:  Was the test performed? No , visit completed over the phone.    Cognitive Function: Normal cognitive status assessed by this Nurse Health Advisor. No abnormalities found.   MMSE -  Mini Mental State Exam 05/26/2020 02/06/2018 02/05/2017  Orientation to time 5 5 5   Orientation to Place 5 5 5   Registration 3 3 3   Attention/ Calculation 5 0 0  Recall 2 3 3   Language- name 2 objects - 0 0  Language- repeat 1 1 1   Language- follow 3 step command - 3 3  Language- read & follow direction - 0 0  Write a sentence - 0 0  Copy design - 0 0  Total score - 20 20        Immunizations Immunization History  Administered Date(s) Administered   Influenza Split 04/17/2016   Influenza,inj,Quad PF,6+ Mos 02/21/2017, 03/17/2018, 02/25/2019, 03/13/2020, 05/18/2021   Pneumococcal Conjugate-13 05/11/2018   Pneumococcal Polysaccharide-23 06/17/2008   Zoster Recombinat (Shingrix) 08/30/2020    TDAP status: Due, Education has been provided regarding the importance of this vaccine. Advised may receive this vaccine at local pharmacy or Health Dept. Aware to provide a copy of the vaccination record if obtained from local pharmacy or Health Dept. Verbalized acceptance and understanding.  Flu Vaccine status: Up to date  Pneumococcal vaccine status: Up to date  Covid-19 vaccine status: Declined, Education has been provided regarding the importance of this vaccine but patient still declined. Advised may receive this vaccine at local pharmacy or Health Dept.or vaccine clinic. Aware to provide a copy of the vaccination record if obtained from local pharmacy or Health Dept. Verbalized acceptance and understanding.  Qualifies for Shingles Vaccine? Yes   Zostavax completed No   Shingrix Completed?: Yes, 1st dose  completed   Screening Tests Health Maintenance  Topic Date Due   PAP SMEAR-Modifier  02/22/2019   MAMMOGRAM  05/20/2019   Zoster Vaccines- Shingrix (2 of 2) 10/25/2020   COVID-19 Vaccine (1) 06/11/2021 (Originally 07/07/1961)   Pneumococcal Vaccine 50-38 Years old (3 - PPSV23 if available, else PCV20) 11/22/2021 (Originally 05/12/2019)   TETANUS/TDAP  02/06/2027 (Originally  01/03/1980)   COLONOSCOPY (Pts 45-98yrs Insurance coverage will need to be confirmed)  08/04/2022   INFLUENZA VACCINE  Completed   Hepatitis C Screening  Completed   HIV Screening  Completed   HPV VACCINES  Aged Out    Health Maintenance  Health Maintenance Due  Topic Date Due   PAP SMEAR-Modifier  02/22/2019   MAMMOGRAM  05/20/2019   Zoster Vaccines- Shingrix (2 of 2) 10/25/2020    Colorectal cancer screening: Type of screening: Colonoscopy. Completed 08/04/17. Repeat every 5 years  Mammogram status: due, last completed 05/19/18, declined today, patient will discuss with PCP when ready.   Bone Density status: Not yet eligible  Lung Cancer Screening: (Low Dose CT Chest recommended if Age 63-80 years, 30 pack-year currently smoking OR have quit w/in 15years.) does qualify. , Pet scan done 06/30/20    Additional Screening:  Hepatitis C Screening: does qualify; Completed 02/05/18  Vision Screening: Recommended annual ophthalmology exams for early detection of glaucoma and other disorders of the eye. Is the patient up to date with their annual eye exam?  Yes  Who is the provider or what is the name of the office in which the patient attends annual eye exams? [facility], provider information unavailable.    Dental Screening: Recommended annual dental exams for proper oral hygiene  Community Resource Referral / Chronic Care Management: CRR required this visit?  No   CCM required this visit?  No      Plan:     I have personally reviewed and noted the following in the patients chart:   Medical and social history Use of alcohol, tobacco or illicit drugs  Current medications and supplements including opioid prescriptions.  Functional ability and status Nutritional status Physical activity Advanced directives List of other physicians Hospitalizations, surgeries, and ER visits in previous 12 months Vitals Screenings to include cognitive, depression, and  falls Referrals and appointments  In addition, I have reviewed and discussed with patient certain preventive protocols, quality metrics, and best practice recommendations. A written personalized care plan for preventive services as well as general preventive health recommendations were provided to patient.   Due to this being a telephonic visit, the after visit summary with patients personalized plan was offered to patient via mail or my-chart Patient preferred to pick up at office at next visit.    Loma Messing, LPN   04/13/2535   Nurse Health Advisor  Nurse Notes: none

## 2021-06-06 ENCOUNTER — Ambulatory Visit (INDEPENDENT_AMBULATORY_CARE_PROVIDER_SITE_OTHER): Payer: Medicare Other

## 2021-06-06 VITALS — Ht 64.0 in | Wt 105.0 lb

## 2021-06-06 DIAGNOSIS — Z0000 Encounter for general adult medical examination without abnormal findings: Secondary | ICD-10-CM

## 2021-06-06 NOTE — Patient Instructions (Signed)
Ms. Gonet , Thank you for taking time to complete your Medicare Wellness Visit. I appreciate your ongoing commitment to your health goals. Please review the following plan we discussed and let me know if I can assist you in the future.   Screening recommendations/referrals: Colonoscopy: up to date, completed 08/04/17, due 08/04/22 Mammogram: due, last completed 05/19/18, declined today, contact office to schedule if you change your mind. Bone Density: not yet eligible  Recommended yearly ophthalmology/optometry visit for glaucoma screening and checkup Recommended yearly dental visit for hygiene and checkup  Vaccinations: Influenza vaccine: up to date Pneumococcal vaccine: up to date Tdap vaccine: Due-May obtain vaccine at your local pharmacy. Shingles vaccine: Discuss with pharmacy for your 2nd dose. Covid-19:  newest booster available at your local pharmacy  Advanced directives: copy on file  Conditions/risks identified: see problem list  Next appointment: Follow up in one year for your annual wellness visit.   Preventive Care 40-64 Years, Female Preventive care refers to lifestyle choices and visits with your health care provider that can promote health and wellness. What does preventive care include? A yearly physical exam. This is also called an annual well check. Dental exams once or twice a year. Routine eye exams. Ask your health care provider how often you should have your eyes checked. Personal lifestyle choices, including: Daily care of your teeth and gums. Regular physical activity. Eating a healthy diet. Avoiding tobacco and drug use. Limiting alcohol use. Practicing safe sex. Taking low-dose aspirin daily starting at age 54. Taking vitamin and mineral supplements as recommended by your health care provider. What happens during an annual well check? The services and screenings done by your health care provider during your annual well check will depend on your age,  overall health, lifestyle risk factors, and family history of disease. Counseling  Your health care provider may ask you questions about your: Alcohol use. Tobacco use. Drug use. Emotional well-being. Home and relationship well-being. Sexual activity. Eating habits. Work and work Statistician. Method of birth control. Menstrual cycle. Pregnancy history. Screening  You may have the following tests or measurements: Height, weight, and BMI. Blood pressure. Lipid and cholesterol levels. These may be checked every 5 years, or more frequently if you are over 72 years old. Skin check. Lung cancer screening. You may have this screening every year starting at age 45 if you have a 30-pack-year history of smoking and currently smoke or have quit within the past 15 years. Fecal occult blood test (FOBT) of the stool. You may have this test every year starting at age 53. Flexible sigmoidoscopy or colonoscopy. You may have a sigmoidoscopy every 5 years or a colonoscopy every 10 years starting at age 51. Hepatitis C blood test. Hepatitis B blood test. Sexually transmitted disease (STD) testing. Diabetes screening. This is done by checking your blood sugar (glucose) after you have not eaten for a while (fasting). You may have this done every 1-3 years. Mammogram. This may be done every 1-2 years. Talk to your health care provider about when you should start having regular mammograms. This may depend on whether you have a family history of breast cancer. BRCA-related cancer screening. This may be done if you have a family history of breast, ovarian, tubal, or peritoneal cancers. Pelvic exam and Pap test. This may be done every 3 years starting at age 46. Starting at age 63, this may be done every 5 years if you have a Pap test in combination with an HPV test. Bone density  scan. This is done to screen for osteoporosis. You may have this scan if you are at high risk for osteoporosis. Discuss your test  results, treatment options, and if necessary, the need for more tests with your health care provider. Vaccines  Your health care provider may recommend certain vaccines, such as: Influenza vaccine. This is recommended every year. Tetanus, diphtheria, and acellular pertussis (Tdap, Td) vaccine. You may need a Td booster every 10 years. Zoster vaccine. You may need this after age 63. Pneumococcal 13-valent conjugate (PCV13) vaccine. You may need this if you have certain conditions and were not previously vaccinated. Pneumococcal polysaccharide (PPSV23) vaccine. You may need one or two doses if you smoke cigarettes or if you have certain conditions. Talk to your health care provider about which screenings and vaccines you need and how often you need them. This information is not intended to replace advice given to you by your health care provider. Make sure you discuss any questions you have with your health care provider. Document Released: 06/30/2015 Document Revised: 02/21/2016 Document Reviewed: 04/04/2015 Elsevier Interactive Patient Education  2017 [place] Prevention in the Home Falls can cause injuries. They can happen to people of all ages. There are many things you can do to make your home safe and to help prevent falls. What can I do on the outside of my home? Regularly fix the edges of walkways and driveways and fix any cracks. Remove anything that might make you trip as you walk through a door, such as a raised step or threshold. Trim any bushes or trees on the path to your home. Use bright outdoor lighting. Clear any walking paths of anything that might make someone trip, such as rocks or tools. Regularly check to see if handrails are loose or broken. Make sure that both sides of any steps have handrails. Any raised decks and porches should have guardrails on the edges. Have any leaves, snow, or ice cleared regularly. Use sand or salt on walking paths during  winter. Clean up any spills in your garage right away. This includes oil or grease spills. What can I do in the bathroom? Use night lights. Install grab bars by the toilet and in the tub and shower. Do not use towel bars as grab bars. Use non-skid mats or decals in the tub or shower. If you need to sit down in the shower, use a plastic, non-slip stool. Keep the floor dry. Clean up any water that spills on the floor as soon as it happens. Remove soap buildup in the tub or shower regularly. Attach bath mats securely with double-sided non-slip rug tape. Do not have throw rugs and other things on the floor that can make you trip. What can I do in the bedroom? Use night lights. Make sure that you have a light by your bed that is easy to reach. Do not use any sheets or blankets that are too big for your bed. They should not hang down onto the floor. Have a firm chair that has side arms. You can use this for support while you get dressed. Do not have throw rugs and other things on the floor that can make you trip. What can I do in the kitchen? Clean up any spills right away. Avoid walking on wet floors. Keep items that you use a lot in easy-to-reach places. If you need to reach something above you, use a strong step stool that has a grab bar. Keep electrical  cords out of the way. Do not use floor polish or wax that makes floors slippery. If you must use wax, use non-skid floor wax. Do not have throw rugs and other things on the floor that can make you trip. What can I do with my stairs? Do not leave any items on the stairs. Make sure that there are handrails on both sides of the stairs and use them. Fix handrails that are broken or loose. Make sure that handrails are as long as the stairways. Check any carpeting to make sure that it is firmly attached to the stairs. Fix any carpet that is loose or worn. Avoid having throw rugs at the top or bottom of the stairs. If you do have throw rugs,  attach them to the floor with carpet tape. Make sure that you have a light switch at the top of the stairs and the bottom of the stairs. If you do not have them, ask someone to add them for you. What else can I do to help prevent falls? Wear shoes that: Do not have high heels. Have rubber bottoms. Are comfortable and fit you well. Are closed at the toe. Do not wear sandals. If you use a stepladder: Make sure that it is fully opened. Do not climb a closed stepladder. Make sure that both sides of the stepladder are locked into place. Ask someone to hold it for you, if possible. Clearly mark and make sure that you can see: Any grab bars or handrails. First and last steps. Where the edge of each step is. Use tools that help you move around (mobility aids) if they are needed. These include: Canes. Walkers. Scooters. Crutches. Turn on the lights when you go into a dark area. Replace any light bulbs as soon as they burn out. Set up your furniture so you have a clear path. Avoid moving your furniture around. If any of your floors are uneven, fix them. If there are any pets around you, be aware of where they are. Review your medicines with your doctor. Some medicines can make you feel dizzy. This can increase your chance of falling. Ask your doctor what other things that you can do to help prevent falls. This information is not intended to replace advice given to you by your health care provider. Make sure you discuss any questions you have with your health care provider. Document Released: 03/30/2009 Document Revised: 11/09/2015 Document Reviewed: 07/08/2014 Elsevier Interactive Patient Education  2017 [employer].

## 2021-06-07 ENCOUNTER — Other Ambulatory Visit: Payer: Medicare Other | Admitting: Student

## 2021-06-07 ENCOUNTER — Other Ambulatory Visit: Payer: Self-pay

## 2021-06-07 DIAGNOSIS — J449 Chronic obstructive pulmonary disease, unspecified: Secondary | ICD-10-CM

## 2021-06-07 DIAGNOSIS — Z515 Encounter for palliative care: Secondary | ICD-10-CM

## 2021-06-07 DIAGNOSIS — R630 Anorexia: Secondary | ICD-10-CM

## 2021-06-07 DIAGNOSIS — F332 Major depressive disorder, recurrent severe without psychotic features: Secondary | ICD-10-CM

## 2021-06-07 NOTE — Progress Notes (Deleted)
[facility] Consult Note Telephone: [phone]  Fax: [phone]    Date of encounter: 06/07/21 10:19 AM PATIENT NAME: Christine Serrano [address] [place] [address]   [phone] (home)  DOB: 24-Sep-1960 MRN: [id number] PRIMARY CARE PROVIDER:    Ria Bush, MD,  [place] [address] [phone]  REFERRING PROVIDER:   Ria Bush, MD [address] [place],  [place] [address] [phone]  RESPONSIBLE PARTY:    Contact Information     Name Relation Home Work Mobile   [place]   [phone]        I met face to face with patient and family in the home. Palliative Care was asked to follow this patient by consultation request of  Ria Bush, MD to address advance care planning and complex medical decision making. This is a follow up visit.                                   ASSESSMENT AND PLAN / RECOMMENDATIONS:   Advance Care Planning/Goals of Care: Goals include to maximize quality of life and symptom management. Our advance care planning conversation included a discussion about:    The value and importance of advance care planning  Experiences with loved ones who have been seriously ill or have died  Exploration of personal, cultural or spiritual beliefs that might influence medical decisions  Exploration of goals of care in the event of a sudden injury or illness  Identification and preparation of a healthcare agent  Review and updating or creation of an  advance directive document . Decision not to resuscitate or to de-escalate disease focused treatments due to poor prognosis. CODE STATUS:  Symptom Management/Plan:    Follow up Palliative Care Visit: Palliative care will continue to follow for complex medical decision making, advance care planning, and clarification of goals. Return *** weeks or prn.  I spent *** minutes providing this  consultation. More than 50% of the time in this consultation was spent in counseling and care coordination.   PPS: 40%  HOSPICE ELIGIBILITY/DIAGNOSIS: TBD  Chief Complaint: Palliative Medicine follow up visit.   HISTORY OF PRESENT ILLNESS:  Christine Serrano is a 60 y.o. year old female  with COPD, depression, cachexia/poor appetite.  She did hve cataract surgery.  G   Was on antibiotics recently and prednisone taper. Wearing oxygen at 4 lpm. Appetite is fair, weight up to 105.9 pounds. Doesn't like the Ensures. Mood is better, sleeping well. No recent falls. Pain has been better, occasional back pain. Occasional constipation; takes Linzess as needed.  Needs a shower chair.   History obtained from review of EMR, discussion with primary team, and interview with family, facility staff/caregiver and/or [name].  I reviewed available labs, medications, imaging, studies and related documents from the EMR.  Records reviewed and summarized above.   ROS  *** General: NAD EYES: denies vision changes ENMT: denies dysphagia Cardiovascular: denies chest pain, denies DOE Pulmonary: denies cough, denies increased SOB Abdomen: endorses good appetite, denies constipation, endorses continence of bowel GU: denies dysuria, endorses continence of urine MSK:  denies weakness,  no falls reported Skin: denies rashes or wounds Neurological: denies pain, denies insomnia Psych:  Endorses positive mood Heme/lymph/immuno: denies bruises, abnormal bleeding  Physical Exam: weight 105 pounds Pulse 92, resp 16, b/p 122/70, sats 94% on 4lpm Constitutional: NAD General: frail appearing, thin EYES: anicteric sclera, lids intact, no discharge  ENMT: intact hearing, oral mucous membranes moist, dentition intact CV: S1S2, RRR, no LE edema Pulmonary: LCTA, no increased work of breathing, no cough, room air Abdomen: intake 100%, normo-active BS + 4 quadrants, soft and non tender, no ascites GU: deferred MSK: sarcopenia, moves all extremities, ambulatory Skin: warm and dry, no rashes or wounds on visible skin Neuro:  no generalized weakness,  no cognitive impairment Psych: non-anxious affect, A and O x 3 Hem/lymph/immuno: no widespread bruising   Thank you for the opportunity to participate in the care of [name].  The palliative care team will continue to follow. Please call our office at [phone] if we can be of additional assistance.   Ezekiel Slocumb, NP   COVID-19 PATIENT SCREENING TOOL Asked and negative response unless otherwise noted:   Have you had symptoms of covid, tested positive or been in contact with someone with symptoms/positive test in the past 5-10 days? No

## 2021-06-08 ENCOUNTER — Encounter: Payer: Medicare Other | Admitting: Family Medicine

## 2021-06-11 ENCOUNTER — Telehealth: Payer: Self-pay

## 2021-06-11 NOTE — Telephone Encounter (Signed)
Message left with information on community resource.

## 2021-06-11 NOTE — Progress Notes (Signed)
[facility] Consult Note Telephone: [phone]  Fax: [phone]    Date of encounter: 06/07/2021  PATIENT NAME: Christine Serrano [address] [place] [address]   [phone] (home)  DOB: 1961/06/13 MRN: [id number] PRIMARY CARE PROVIDER:    Ria Bush, MD,  [place] [address] [phone]  REFERRING PROVIDER:   Ria Bush, MD [address] [place],  [place] [address] [phone]  RESPONSIBLE PARTY:    Contact Information     Name Relation Home Work Mobile   [place]   [phone]        I met face to face with patient and family in the home. Palliative Care was asked to follow this patient by consultation request of  Ria Bush, MD to address advance care planning and complex medical decision making. This is a follow up visit.                                   ASSESSMENT AND PLAN / RECOMMENDATIONS:   Advance Care Planning/Goals of Care: Goals include to maximize quality of life and symptom management. Our advance care planning conversation included a discussion about:    The value and importance of advance care planning  Experiences with loved ones who have been seriously ill or have died  Exploration of personal, cultural or spiritual beliefs that might influence medical decisions  CODE STATUS: DNR  Symptom Management/Plan:   COPD-patient wears oxygen at 4 liters per minute continuously. Continue prn albuterol nebulizer, Breztri inhaler as directed. Follow up with pulmonology as scheduled.   Appetite/cachexia-appetite varies; she has gained some weight up to 105 pounds. Continue eating foods she enjoys, soft foods. Discussed nutritional supplement options.   Depression-patient reports her depression currently managed. She has a new puppy, which she feels has helped her mood. Continue sertraline, Wellbutrin and mirtazapine as directed.   Patient  requesting shower chair; will have palliative nurse follow up on DME request.   Follow up Palliative Care Visit: Palliative care will continue to follow for complex medical decision making, advance care planning, and clarification of goals. Return in 8 weeks or prn.  I spent 40 minutes providing this consultation. More than 50% of the time in this consultation was spent in counseling and care coordination.   PPS: 40%  HOSPICE ELIGIBILITY/DIAGNOSIS: TBD  Chief Complaint: Palliative Medicine follow up visit.   HISTORY OF PRESENT ILLNESS:  Christine REASOR is a 60 y.o. year old female  with COPD, depression anxiety.  Patient states she is feeling better. She was seen in ED on 12/10 due to COPD exacerbation. She was also treated for CAP in the past 4 weeks with antibiotics and prednisone taper. She is wearing oxygen at 4 lpm continuously. Appetite has improved some; her weight up to 105.9 pounds. Doesn't like the Ensures. She reports her mood being better. She has a new puppy. She is sleeping well. No recent falls. Pain has been better, occasional back pain. Occasional constipation; takes Linzess as needed. She does state she needs a shower chair. Denies any other needs at this time. A 10-point review of systems is negative, except for the pertinent positives and negatives detailed in the HPI.     History obtained from review of EMR, discussion with primary team, and interview with family, facility staff/caregiver and/or [name].  I reviewed available labs, medications,  imaging, studies and related documents from the EMR.  Records reviewed and summarized above.    Physical Exam: weight 105.9 pounds Pulse 92, resp 16, b/p 122/70, sats 94% on 4lpm Constitutional: NAD General: frail appearing, thin EYES: anicteric sclera, lids intact, no discharge  ENMT: intact hearing, oral mucous membranes moist, dentition intact CV: S1S2, RRR, no LE edema Pulmonary: LCTA, no increased work of  breathing, occasional cough Abdomen: normo-active BS + 4 quadrants, soft and non tender, no ascites GU: deferred MSK: sarcopenia, moves all extremities, ambulatory Skin: warm and dry, no rashes or wounds on visible skin Neuro:  no generalized weakness, A & O x 3 Psych: non-anxious affect, pleasant Hem/lymph/immuno: no widespread bruising   Thank you for the opportunity to participate in the care of Christine Serrano.  The palliative care team will continue to follow. Please call our office at [phone] if we can be of additional assistance.   Ezekiel Slocumb, NP   COVID-19 PATIENT SCREENING TOOL Asked and negative response unless otherwise noted:   Have you had symptoms of covid, tested positive or been in contact with someone with symptoms/positive test in the past 5-10 days? No

## 2021-06-11 NOTE — Telephone Encounter (Signed)
VM left for patient regarding requested community resource.

## 2021-06-14 ENCOUNTER — Encounter: Payer: Self-pay | Admitting: Pulmonary Disease

## 2021-06-14 NOTE — Addendum Note (Signed)
Addended by: Kris Mouton on: 06/14/2021 04:23 PM   Modules accepted: Orders

## 2021-06-14 NOTE — Telephone Encounter (Signed)
Receiving fax from [facility] stating Ventolin is not covered but albuterol-proair is. Not sure if the RX need to be sent for DAW so it does not pull up ventolin as it is the same thing.  Will let provider review to clarify

## 2021-06-18 ENCOUNTER — Other Ambulatory Visit: Payer: Self-pay | Admitting: Family Medicine

## 2021-06-18 DIAGNOSIS — D649 Anemia, unspecified: Secondary | ICD-10-CM

## 2021-06-18 DIAGNOSIS — E538 Deficiency of other specified B group vitamins: Secondary | ICD-10-CM

## 2021-06-18 DIAGNOSIS — M810 Age-related osteoporosis without current pathological fracture: Secondary | ICD-10-CM

## 2021-06-18 DIAGNOSIS — E785 Hyperlipidemia, unspecified: Secondary | ICD-10-CM

## 2021-06-18 MED ORDER — ALBUTEROL SULFATE HFA 108 (90 BASE) MCG/ACT IN AERS: INHALATION_SPRAY | RESPIRATORY_TRACT | 2 refills | Status: DC

## 2021-06-18 NOTE — Telephone Encounter (Signed)
ERx 

## 2021-06-19 ENCOUNTER — Other Ambulatory Visit: Payer: Medicare Other

## 2021-06-21 DIAGNOSIS — J449 Chronic obstructive pulmonary disease, unspecified: Secondary | ICD-10-CM | POA: Diagnosis not present

## 2021-06-25 ENCOUNTER — Other Ambulatory Visit: Payer: Self-pay

## 2021-06-25 ENCOUNTER — Encounter: Payer: Self-pay | Admitting: Family Medicine

## 2021-06-25 ENCOUNTER — Ambulatory Visit (INDEPENDENT_AMBULATORY_CARE_PROVIDER_SITE_OTHER): Payer: Medicare HMO | Admitting: Family Medicine

## 2021-06-25 VITALS — BP 118/72 | HR 112 | Temp 98.0°F | Ht 63.0 in | Wt 103.4 lb

## 2021-06-25 DIAGNOSIS — M810 Age-related osteoporosis without current pathological fracture: Secondary | ICD-10-CM | POA: Diagnosis not present

## 2021-06-25 DIAGNOSIS — E538 Deficiency of other specified B group vitamins: Secondary | ICD-10-CM

## 2021-06-25 DIAGNOSIS — J449 Chronic obstructive pulmonary disease, unspecified: Secondary | ICD-10-CM

## 2021-06-25 DIAGNOSIS — Z0001 Encounter for general adult medical examination with abnormal findings: Secondary | ICD-10-CM | POA: Diagnosis not present

## 2021-06-25 DIAGNOSIS — K5909 Other constipation: Secondary | ICD-10-CM | POA: Diagnosis not present

## 2021-06-25 DIAGNOSIS — R000 Tachycardia, unspecified: Secondary | ICD-10-CM

## 2021-06-25 DIAGNOSIS — Z7189 Other specified counseling: Secondary | ICD-10-CM

## 2021-06-25 DIAGNOSIS — F332 Major depressive disorder, recurrent severe without psychotic features: Secondary | ICD-10-CM

## 2021-06-25 DIAGNOSIS — Z515 Encounter for palliative care: Secondary | ICD-10-CM | POA: Diagnosis not present

## 2021-06-25 DIAGNOSIS — I700 Atherosclerosis of aorta: Secondary | ICD-10-CM

## 2021-06-25 DIAGNOSIS — D649 Anemia, unspecified: Secondary | ICD-10-CM

## 2021-06-25 DIAGNOSIS — E785 Hyperlipidemia, unspecified: Secondary | ICD-10-CM

## 2021-06-25 DIAGNOSIS — Z7409 Other reduced mobility: Secondary | ICD-10-CM

## 2021-06-25 DIAGNOSIS — J9611 Chronic respiratory failure with hypoxia: Secondary | ICD-10-CM

## 2021-06-25 DIAGNOSIS — Z9981 Dependence on supplemental oxygen: Secondary | ICD-10-CM

## 2021-06-25 DIAGNOSIS — Z720 Tobacco use: Secondary | ICD-10-CM

## 2021-06-25 DIAGNOSIS — F419 Anxiety disorder, unspecified: Secondary | ICD-10-CM

## 2021-06-25 LAB — COMPREHENSIVE METABOLIC PANEL
ALT: 7 U/L (ref 0–35)
AST: 15 U/L (ref 0–37)
Albumin: 3.9 g/dL (ref 3.5–5.2)
Alkaline Phosphatase: 95 U/L (ref 39–117)
BUN: 7 mg/dL (ref 6–23)
CO2: 36 mEq/L — ABNORMAL HIGH (ref 19–32)
Calcium: 9 mg/dL (ref 8.4–10.5)
Chloride: 100 mEq/L (ref 96–112)
Creatinine, Ser: 0.52 mg/dL (ref 0.40–1.20)
GFR: 99.54 mL/min (ref >60.00)
Glucose, Bld: 71 mg/dL (ref 70–99)
Potassium: 4 mEq/L (ref 3.5–5.1)
Sodium: 141 mEq/L (ref 135–145)
Total Bilirubin: 0.3 mg/dL (ref 0.2–1.2)
Total Protein: 6.1 g/dL (ref 6.0–8.3)

## 2021-06-25 LAB — CBC WITH DIFFERENTIAL/PLATELET
Basophils Absolute: 0 10*3/uL (ref 0.0–0.1)
Basophils Relative: 0.4 % (ref 0.0–3.0)
Eosinophils Absolute: 0.1 10*3/uL (ref 0.0–0.7)
Eosinophils Relative: 0.9 % (ref 0.0–5.0)
HCT: 32 % — ABNORMAL LOW (ref 36.0–46.0)
Hemoglobin: 10.2 g/dL — ABNORMAL LOW (ref 12.0–15.0)
Lymphocytes Relative: 24.2 % (ref 12.0–46.0)
Lymphs Abs: 2.1 10*3/uL (ref 0.7–4.0)
MCHC: 31.8 g/dL (ref 30.0–36.0)
MCV: 81.5 fl (ref 78.0–100.0)
Monocytes Absolute: 0.7 10*3/uL (ref 0.1–1.0)
Monocytes Relative: 8.8 % (ref 3.0–12.0)
Neutro Abs: 5.6 10*3/uL (ref 1.4–7.7)
Neutrophils Relative %: 65.7 % (ref 43.0–77.0)
Platelets: 224 10*3/uL (ref 150.0–400.0)
RBC: 3.92 Mil/uL (ref 3.87–5.11)
RDW: 15.1 % (ref 11.5–15.5)
WBC: 8.5 10*3/uL (ref 4.0–10.5)

## 2021-06-25 LAB — IBC PANEL
Iron: 13 ug/dL — ABNORMAL LOW (ref 42–145)
Saturation Ratios: 2.6 % — ABNORMAL LOW (ref 20.0–50.0)
TIBC: 502.6 ug/dL — ABNORMAL HIGH (ref 250.0–450.0)
Transferrin: 359 mg/dL (ref 212.0–360.0)

## 2021-06-25 LAB — LIPID PANEL
Cholesterol: 182 mg/dL (ref 0–200)
HDL: 55.3 mg/dL (ref >39.00)
LDL Cholesterol: 97 mg/dL (ref 0–99)
NonHDL: 126.93
Total CHOL/HDL Ratio: 3
Triglycerides: 152 mg/dL — ABNORMAL HIGH (ref 0.0–149.0)
VLDL: 30.4 mg/dL (ref 0.0–40.0)

## 2021-06-25 LAB — VITAMIN D 25 HYDROXY (VIT D DEFICIENCY, FRACTURES): VITD: 58.47 ng/mL (ref 30.00–100.00)

## 2021-06-25 LAB — VITAMIN B12: Vitamin B-12: 263 pg/mL (ref 211–911)

## 2021-06-25 LAB — FERRITIN: Ferritin: 7.4 ng/mL — ABNORMAL LOW (ref 10.0–291.0)

## 2021-06-25 NOTE — Assessment & Plan Note (Signed)
Continued smoker - not planning to quit.

## 2021-06-25 NOTE — Assessment & Plan Note (Addendum)
Continue prolia injections.  Latest DEXA 05/2018.  Consider updating this year (?with mammo).

## 2021-06-25 NOTE — Assessment & Plan Note (Signed)
Rx provided for bedside commode.

## 2021-06-25 NOTE — Patient Instructions (Addendum)
Labs today  [facility] about [facility] your [facility] shot.  Good to see you today  Return as needed or in 3-4 months for follow up visit  Rx written for bedside commode.    Health Maintenance for Postmenopausal Women Menopause is a normal process in which your ability to get pregnant comes to an end. This process happens slowly over many months or years, usually between the ages of 88 and 57. Menopause is complete when you have missed your menstrual period for 12 months. It is important to talk with your health care provider about some of the most common conditions that affect women after menopause (postmenopausal women). These include heart disease, cancer, and bone loss (osteoporosis). Adopting a healthy lifestyle and getting preventive care can help to promote your health and wellness. The actions you take can also lower your chances of developing some of these common conditions. What are the signs and symptoms of menopause? During menopause, you may have the following symptoms: Hot flashes. These can be moderate or severe. Night sweats. Decrease in sex drive. Mood swings. Headaches. Tiredness (fatigue). Irritability. Memory problems. Problems falling asleep or staying asleep. Talk with your health care provider about treatment options for your symptoms. Do I need hormone replacement therapy? Hormone replacement therapy is effective in treating symptoms that are caused by menopause, such as hot flashes and night sweats. Hormone replacement carries certain risks, especially as you become older. If you are thinking about using estrogen or estrogen with progestin, discuss the benefits and risks with your health care provider. How can I reduce my risk for heart disease and stroke? The risk of heart disease, heart attack, and stroke increases as you age. One of the causes may be a change in the body's hormones during menopause. This can affect how your body uses dietary fats,  triglycerides, and cholesterol. Heart attack and stroke are medical emergencies. There are many things that you can do to help prevent heart disease and stroke. Watch your blood pressure High blood pressure causes heart disease and increases the risk of stroke. This is more likely to develop in people who have high blood pressure readings or are overweight. Have your blood pressure checked: Every 3-5 years if you are 66-25 years of age. Every year if you are 58 years old or older. Eat a healthy diet  Eat a diet that includes plenty of vegetables, fruits, low-fat dairy products, and lean protein. Do not eat a lot of foods that are high in solid fats, added sugars, or sodium. Get regular exercise Get regular exercise. This is one of the most important things you can do for your health. Most adults should: Try to exercise for at least 150 minutes each week. The exercise should increase your heart rate and make you sweat (moderate-intensity exercise). Try to do strengthening exercises at least twice each week. Do these in addition to the moderate-intensity exercise. Spend less time sitting. Even light physical activity can be beneficial. Other tips Work with your health care provider to achieve or maintain a healthy weight. Do not use any products that contain nicotine or tobacco. These products include cigarettes, chewing tobacco, and vaping devices, such as e-cigarettes. If you need help quitting, ask your health care provider. Know your numbers. Ask your health care provider to check your cholesterol and your blood sugar (glucose). Continue to have your blood tested as directed by your health care provider. Do I need screening for cancer? Depending on your health history and  family history, you may need to have cancer screenings at different stages of your life. This may include screening for: Breast cancer. Cervical cancer. Lung cancer. Colorectal cancer. What is my risk for  osteoporosis? After menopause, you may be at increased risk for osteoporosis. Osteoporosis is a condition in which bone destruction happens more quickly than new bone creation. To help prevent osteoporosis or the bone fractures that can happen because of osteoporosis, you may take the following actions: If you are 58-44 years old, get at least 1,000 mg of calcium and at least 600 international units (IU) of vitamin D per day. If you are older than age 68 but younger than age 9, get at least 1,200 mg of calcium and at least 600 international units (IU) of vitamin D per day. If you are older than age 47, get at least 1,200 mg of calcium and at least 800 international units (IU) of vitamin D per day. Smoking and drinking excessive alcohol increase the risk of osteoporosis. Eat foods that are rich in calcium and vitamin D, and do weight-bearing exercises several times each week as directed by your health care provider. How does menopause affect my mental health? Depression may occur at any age, but it is more common as you become older. Common symptoms of depression include: Feeling depressed. Changes in sleep patterns. Changes in appetite or eating patterns. Feeling an overall lack of motivation or enjoyment of activities that you previously enjoyed. Frequent crying spells. Talk with your health care provider if you think that you are experiencing any of these symptoms. General instructions See your health care provider for regular wellness exams and vaccines. This may include: Scheduling regular health, dental, and eye exams. Getting and maintaining your vaccines. These include: Influenza vaccine. Get this vaccine each year before the flu season begins. Pneumonia vaccine. Shingles vaccine. Tetanus, diphtheria, and pertussis (Tdap) booster vaccine. Your health care provider may also recommend other immunizations. Tell your health care provider if you have ever been abused or do not feel safe at  home. Summary Menopause is a normal process in which your ability to get pregnant comes to an end. This condition causes hot flashes, night sweats, decreased interest in sex, mood swings, headaches, or lack of sleep. Treatment for this condition may include hormone replacement therapy. Take actions to keep yourself healthy, including exercising regularly, eating a healthy diet, watching your weight, and checking your blood pressure and blood sugar levels. Get screened for cancer and depression. Make sure that you are up to date with all your vaccines. This information is not intended to replace advice given to you by your health care provider. Make sure you discuss any questions you have with your health care provider. Document Revised: 10/23/2020 Document Reviewed: 10/23/2020 Elsevier Patient Education  [place].

## 2021-06-25 NOTE — Assessment & Plan Note (Addendum)
Preventative protocols reviewed and updated unless pt declined. Discussed healthy diet and lifestyle.  Will stop screening for cancer due to comorbidities.

## 2021-06-25 NOTE — Assessment & Plan Note (Signed)
Sees [facility] palliative care home team.

## 2021-06-25 NOTE — Assessment & Plan Note (Addendum)
Chronic, stable period on linzess - continue

## 2021-06-25 NOTE — Assessment & Plan Note (Signed)
Continue hydroxyzine PRN.

## 2021-06-25 NOTE — Assessment & Plan Note (Signed)
Chronic - albuterol use exacerbates this.

## 2021-06-25 NOTE — Progress Notes (Signed)
Patient ID: Christine Serrano, female    DOB: 10/09/1960, 61 y.o.   MRN: [id number]  This visit was conducted in person.  BP 118/72    Pulse (!) 112    Temp 98 F (36.7 C) (Temporal)    Ht 5\' 3"  (1.6 m)    Wt 103 lb 7 oz (46.9 kg)    LMP  (LMP Unknown)    SpO2 96%    BMI 18.32 kg/m    CC: CPE Subjective:   HPI: Christine Serrano is a 61 y.o. female presenting on 06/25/2021 for Annual Exam [facility] prt 2.  Accompanied by roommate, Angie.)   Saw health advisor last month for medicare wellness visit. Note reviewed.   No results found.  Flowsheet Row Clinical Support from 06/06/2021 in [place] at [place]  PHQ-2 Total Score 0       Fall Risk  06/06/2021 05/26/2020 02/25/2019 03/09/2018 02/06/2018  Falls in the past year? 1 0 1 No No  Number falls in past yr: 0 0 1 - -  Injury with Fall? 1 0 1 - -  Comment concussion and stitches - - - -  Risk for fall due to : Impaired balance/gait Medication side effect - - -  Follow up Falls prevention discussed Falls evaluation completed;Falls prevention discussed - - -  Fall was 10/2020. No falls since then.   Requests Rx for potty chair due to urinary incontinence. No bowel trouble, endorses h/o CIC managed with linzess.    She continues breztri 2 puffs BID along with albuterol inhaler and nebulizer (each 3x/day). Just completed course of zpack and medrol dosepak. She took albuterol prior to coming in today.   Preventative:  COLONOSCOPY WITH PROPOFOL 08/04/2017 - TAx3, angiodysplastic lesion, diverticulosis, rpt 5 yrs ([facility]). Recommend stop at this time due to co-morbidities.  Mammogram 05/2018 - Birads1 @ [place]. She does breast exams at home without concerns. Will stop due to comorbidities.  Well woman exam - h/o cervical abnormalities s/p conization 1978, then laser surgery 1987. Pap normal 02/2017 - we have stopped at this time due to comorbidities.  DEXA: 05/2018 - T -3.5 hip, -2.6 spine. Prolia started 05/2018, last dose  05/17/2021. Consider updating DEXA (with mammo?) Flu shot yearly  Pneumovax 2010. KDTOIZT-24 04/2018 Tetanus - declined  [facility] - [facility]  Advanced directive discussion: has at home. HCPOA is partner [name]. Thinks would be ok with CPR, but wouldn't want prolonged life support if terminal condition. Doesn't think she would want trial of intubation. Asked to bring [place] a copy.  Seat belt use discussed  Sunscreen use discussed - no changing moles on skin.  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 Interim medical history since our last visit reviewed. Allergies and medications reviewed and updated. Outpatient Medications Prior to Visit  Medication Sig Dispense Refill   albuterol (PROVENTIL) (2.5 MG/3ML) 0.083% nebulizer solution Take 3 mLs (2.5 mg total) by nebulization every 6 (six) hours as  needed for wheezing or shortness of breath. 150 mL 6   albuterol (VENTOLIN HFA) 108 (90 Base) MCG/ACT inhaler TAKE 2 PUFFS BY MOUTH EVERY 6 HOURS AS NEEDED FOR WHEEZE OR SHORTNESS OF BREATH 25.5 each 2   amoxicillin-clavulanate (AUGMENTIN) 875-125 MG tablet Take 1 tablet by mouth 2 (two) times daily. One po bid x 7 days 14 tablet 0   Ascorbic Acid (VITAMIN C WITH ROSE HIPS) 500 MG tablet Take 500 mg by mouth daily.     aspirin EC 81 MG tablet Take 81 mg by mouth daily.     benzonatate (TESSALON) 100 MG capsule Take 1 capsule (100 mg total) by mouth 3 (three) times daily as needed  for cough. 30 capsule 3   Budeson-Glycopyrrol-Formoterol (BREZTRI AEROSPHERE) 160-9-4.8 MCG/ACT AERO Inhale 2 puffs into the lungs in the morning and at bedtime. 32.1 g 3   buPROPion (WELLBUTRIN XL) 150 MG 24 hr tablet Take 1 tablet (150 mg total) by mouth daily. 90 tablet 1   cholecalciferol (VITAMIN D3) 25 MCG (1000 UNIT) tablet Take 1,000 Units by mouth daily.     fluticasone (FLONASE) 50 MCG/ACT nasal spray Place 2 sprays into both nostrils daily. 48 g 3   folic acid (FOLVITE) 1 MG tablet Take 1 tablet (1 mg total) by mouth daily. 90 tablet 1   gabapentin (NEURONTIN) 100 MG capsule Take 1 capsule (100 mg total) by mouth 2 (two) times daily. 180 capsule 1   hydrOXYzine (ATARAX/VISTARIL) 25 MG tablet Take 1 tablet (25 mg total) by mouth 2 (two) times daily as needed for anxiety. 60 tablet 6   LINZESS 290 MCG CAPS capsule Take 1 capsule (290 mcg total) by mouth daily as needed (constipation). TAKE 1 CAPSULE EVERY DAY AS NEEDED FOR CONSTIPATION AS DIRECTED 90 capsule 1   meclizine (ANTIVERT) 25 MG tablet Take 1 tablet (25 mg total) by mouth 3 (three) times daily as needed for dizziness. 30 tablet 0   methocarbamol (ROBAXIN) 500 MG tablet TAKE 1 TABLET BY MOUTH 3 TIMES DAILY AS NEEDED FOR MUSCLE SPASMS (SEDATION PRECAUTIONS). 40 tablet 3   mirtazapine (REMERON) 7.5 MG tablet Take 1 tablet (7.5 mg total) by mouth at bedtime. 90 tablet 1   Multiple Vitamin (MULTIVITAMIN WITH MINERALS) TABS tablet Take 1 tablet by mouth daily.     naproxen (NAPROSYN) 375 MG tablet TAKE 1 TABLET BY MOUTH TWICE A DAY AS NEEDED FOR PAIN WITH FOOD 30 tablet 1   omeprazole (PRILOSEC) 40 MG capsule Take 1 capsule (40 mg total) by mouth daily. 90 capsule 1   OXYGEN Inhale 4 L/min into the lungs continuous.      polyethylene glycol powder (GLYCOLAX/MIRALAX) 17 GM/SCOOP powder Take 17 g by mouth daily as needed for moderate constipation. 3350 g 1   sertraline (ZOLOFT) 100 MG tablet Take 1.5 tablets (150 mg total) by mouth daily.  135 tablet 1   thiamine 100 MG tablet Take 1 tablet (100 mg total) by mouth daily. 30 tablet 0   vitamin B-12 (CYANOCOBALAMIN) 1000 MCG tablet Take 1 tablet (1,000 mcg total) by mouth daily.     methylPREDNISolone (MEDROL DOSEPAK) 4 MG TBPK tablet Take as directed in the package. 21 tablet 0   No facility-administered medications prior to visit.     Per HPI unless specifically indicated in ROS section below Review of Systems  Constitutional:  Positive for chills. Negative for activity change, appetite change, fatigue, fever and unexpected weight change.  HENT:  Negative for hearing loss.  Eyes:  Negative for visual disturbance.  Respiratory:  Positive for cough, shortness of breath and wheezing. Negative for chest tightness.   Cardiovascular:  Negative for chest pain, palpitations and leg swelling.  Gastrointestinal:  Positive for constipation. Negative for abdominal distention, abdominal pain, blood in stool, diarrhea, nausea and vomiting.  Genitourinary:  Negative for difficulty urinating and hematuria.  Musculoskeletal:  Negative for arthralgias, myalgias and neck pain.  Skin:  Negative for rash.  Neurological:  Negative for dizziness, seizures, syncope and headaches.  Hematological:  Negative for adenopathy. Bruises/bleeds easily.  Psychiatric/Behavioral:  Negative for dysphoric mood. The patient is not nervous/anxious.    Objective:  BP 118/72    Pulse (!) 112    Temp 98 F (36.7 C) (Temporal)    Ht 5\' 3"  (1.6 m)    Wt 103 lb 7 oz (46.9 kg)    LMP  (LMP Unknown)    SpO2 96%    BMI 18.32 kg/m   Wt Readings from Last 3 Encounters:  06/25/21 103 lb 7 oz (46.9 kg)  06/06/21 105 lb (47.6 kg)  05/30/21 105 lb 12.8 oz (48 kg)      Physical Exam Vitals and nursing note reviewed.  Constitutional:      Appearance: Normal appearance. She is not ill-appearing.  HENT:     Head: Normocephalic and atraumatic.     Right Ear: Tympanic membrane, ear canal and external ear normal. There is  no impacted cerumen.     Left Ear: Tympanic membrane, ear canal and external ear normal. There is no impacted cerumen.  Eyes:     General:        Right eye: No discharge.        Left eye: No discharge.     Extraocular Movements: Extraocular movements intact.     Conjunctiva/sclera: Conjunctivae normal.     Pupils: Pupils are equal, round, and reactive to light.  Neck:     Thyroid: No thyroid mass or thyromegaly.  Cardiovascular:     Rate and Rhythm: Normal rate and regular rhythm.     Pulses: Normal pulses.     Heart sounds: Normal heart sounds. No murmur heard. Pulmonary:     Effort: Pulmonary effort is normal. No respiratory distress.     Breath sounds: Normal breath sounds. No wheezing, rhonchi or rales.  Abdominal:     General: Bowel sounds are normal. There is no distension.     Palpations: Abdomen is soft. There is no mass.     Tenderness: There is no abdominal tenderness. There is no guarding or rebound.     Hernia: No hernia is present.  Musculoskeletal:     Cervical back: Normal range of motion and neck supple. No rigidity.     Right lower leg: No edema.     Left lower leg: No edema.  Lymphadenopathy:     Cervical: No cervical adenopathy.  Skin:    General: Skin is warm and dry.     Findings: No rash.  Neurological:     General: No focal deficit present.     Mental Status: She is alert. Mental status is at baseline.  Psychiatric:        Mood and Affect: Mood normal.        Behavior: Behavior normal.      Results for orders placed or performed during the hospital encounter of 05/26/21  Resp Panel by RT-PCR (Flu A&B, Covid) Nasopharyngeal Swab   Specimen: Nasopharyngeal Swab; Nasopharyngeal(NP) swabs in vial  transport medium  Result Value Ref Range   SARS Coronavirus 2 by RT PCR NEGATIVE NEGATIVE   Influenza A by PCR NEGATIVE NEGATIVE   Influenza B by PCR NEGATIVE NEGATIVE  Comprehensive metabolic panel  Result Value Ref Range   Sodium 138 135 - 145 mmol/L    Potassium 3.5 3.5 - 5.1 mmol/L   Chloride 94 (L) 98 - 111 mmol/L   CO2 36 (H) 22 - 32 mmol/L   Glucose, Bld 138 (H) 70 - 99 mg/dL   BUN 7 (L) 8 - 23 mg/dL   Creatinine, Ser 0.42 (L) 0.44 - 1.00 mg/dL   Calcium 8.8 (L) 8.9 - 10.3 mg/dL   Total Protein 7.2 6.5 - 8.1 g/dL   Albumin 4.0 3.5 - 5.0 g/dL   AST 24 15 - 41 U/L   ALT 13 0 - 44 U/L   Alkaline Phosphatase 95 38 - 126 U/L   Total Bilirubin 0.4 0.3 - 1.2 mg/dL   GFR, Estimated >60 >60 mL/min   Anion gap 8 5 - 15  CBC with Differential  Result Value Ref Range   WBC 5.6 4.0 - 10.5 K/uL   RBC 4.06 3.87 - 5.11 MIL/uL   Hemoglobin 10.9 (L) 12.0 - 15.0 g/dL   HCT 34.9 (L) 36.0 - 46.0 %   MCV 86.0 80.0 - 100.0 fL   MCH 26.8 26.0 - 34.0 pg   MCHC 31.2 30.0 - 36.0 g/dL   RDW 15.3 11.5 - 15.5 %   Platelets 196 150 - 400 K/uL   nRBC 0.0 0.0 - 0.2 %   Neutrophils Relative % 75 %   Neutro Abs 4.2 1.7 - 7.7 K/uL   Lymphocytes Relative 21 %   Lymphs Abs 1.2 0.7 - 4.0 K/uL   Monocytes Relative 2 %   Monocytes Absolute 0.1 0.1 - 1.0 K/uL   Eosinophils Relative 1 %   Eosinophils Absolute 0.0 0.0 - 0.5 K/uL   Basophils Relative 1 %   Basophils Absolute 0.0 0.0 - 0.1 K/uL   Immature Granulocytes 0 %   Abs Immature Granulocytes 0.01 0.00 - 0.07 K/uL  Lactic acid, plasma  Result Value Ref Range   Lactic Acid, Venous 1.4 0.5 - 1.9 mmol/L  Troponin I (High Sensitivity)  Result Value Ref Range   Troponin I (High Sensitivity) 10 <18 ng/L    Assessment & Plan:  This visit occurred during the SARS-CoV-2 public health emergency.  Safety protocols were in place, including screening questions prior to the visit, additional usage of staff PPE, and extensive cleaning of exam room while observing appropriate contact time as indicated for disinfecting solutions.   Problem List Items Addressed This Visit     Encounter for general adult medical examination with abnormal findings - Primary (Chronic)    Preventative protocols reviewed and updated  unless pt declined. Discussed healthy diet and lifestyle.  Will stop screening for cancer due to comorbidities.       Advanced directives, counseling/discussion (Chronic)    Advanced directive discussion: has at home. HCPOA is partner [name]. Thinks would be ok with CPR, but wouldn't want prolonged life support if terminal condition. Doesn't think she would want trial of intubation. Asked to bring [place] a copy.       Palliative care status (Chronic)    Sees [facility] palliative care home team.       MDD (major depressive disorder), recurrent episode, severe ([place])    Chronic, improved since she has new dog. Continues sertraline  150mg  daily, wellbutrin XL 150mg  daily, hydroxyzine 25mg  BID PRN anxiety.       Chronic constipation    Chronic, stable period on linzess - continue      COPD, very severe ([place])    Oxygen dependent.  Continues breztri 2 puffs BID with PRN albuterol inh/neb.  She recently completed medrol dosepak through pulm.  Looks like she is now off daily prednisone.       Tachycardia    Chronic - albuterol use exacerbates this.       Aortic atherosclerosis (HCC)    Continues aspirin, off statin.       Tobacco abuse    Continued smoker - not planning to quit.       Anxiety    Continue hydroxyzine PRN.       HLD (hyperlipidemia)    Update FLP off statin.  The 10-year ASCVD risk score (Arnett DK, et al., 2019) is: 6.3%   Values used to calculate the score:     Age: 72 years     Sex: Female     Is Non-Hispanic African American: No     Diabetic: No     Tobacco smoker: Yes     Systolic Blood Pressure: 916 mmHg     Is BP treated: No     HDL Cholesterol: 58.4 mg/dL     Total Cholesterol: 183 mg/dL       Osteoporosis    Continue prolia injections.  Latest DEXA 05/2018.  Consider updating this year (?with mammo).       Anemia    Update labs.       Impaired mobility    Rx provided for bedside commode.       Chronic respiratory failure with  hypoxia, on home O2 therapy (HCC)   Low serum vitamin B12     No orders of the defined types were placed in this encounter.  No orders of the defined types were placed in this encounter.    Patient instructions: Labs today  [facility].  Good to see you today  Return as needed or in 3-4 months for follow up visit  Rx written for bedside commode.    Follow up plan: No follow-ups on file.  Ria Bush, MD

## 2021-06-25 NOTE — Assessment & Plan Note (Signed)
Chronic, improved since she has new dog. Continues sertraline 150mg  daily, wellbutrin XL 150mg  daily, hydroxyzine 25mg  BID PRN anxiety.

## 2021-06-25 NOTE — Assessment & Plan Note (Signed)
Update labs.  

## 2021-06-25 NOTE — Assessment & Plan Note (Addendum)
Oxygen dependent.  Continues breztri 2 puffs BID with PRN albuterol inh/neb.  She recently completed medrol dosepak through pulm.  Looks like she is now off daily prednisone.

## 2021-06-25 NOTE — Assessment & Plan Note (Signed)
Advanced directive discussion: has at home. HCPOA is partner [name]. Thinks would be ok with CPR, but wouldn't want prolonged life support if terminal condition. Doesn't think she would want trial of intubation. Asked to bring [place] a copy.

## 2021-06-25 NOTE — Assessment & Plan Note (Signed)
Continues aspirin, off statin.

## 2021-06-25 NOTE — Assessment & Plan Note (Signed)
Update FLP off statin.  The 10-year ASCVD risk score (Arnett DK, et al., 2019) is: 6.3%   Values used to calculate the score:     Age: 61 years     Sex: Female     Is Non-Hispanic African American: No     Diabetic: No     Tobacco smoker: Yes     Systolic Blood Pressure: 440 mmHg     Is BP treated: No     HDL Cholesterol: 58.4 mg/dL     Total Cholesterol: 183 mg/dL

## 2021-06-29 ENCOUNTER — Encounter: Payer: Self-pay | Admitting: Family Medicine

## 2021-06-29 ENCOUNTER — Other Ambulatory Visit: Payer: Self-pay | Admitting: Family Medicine

## 2021-06-29 DIAGNOSIS — D509 Iron deficiency anemia, unspecified: Secondary | ICD-10-CM

## 2021-06-29 MED ORDER — IRON (FERROUS SULFATE) 325 (65 FE) MG PO TABS: 325.0000 mg | ORAL_TABLET | Freq: Every day | ORAL | Status: DC

## 2021-07-03 ENCOUNTER — Telehealth: Payer: Self-pay | Admitting: Family Medicine

## 2021-07-03 NOTE — Telephone Encounter (Addendum)
Spoke with pt reminding her we gave her a written rx for bedside commode at last OV, 06/25/21.  Says she has that but [facility] is requesting rx be faxed.  Dr. Darnell Level, do you want to write another rx or place DME order in Epic?

## 2021-07-03 NOTE — Telephone Encounter (Signed)
New Rx written and in Lisa's box.  °

## 2021-07-03 NOTE — Telephone Encounter (Signed)
Pt called in stated [facility] is requesting a proscription for her bedside commode . Would like a call back to discuss . Please Advise [phone]

## 2021-07-04 NOTE — Telephone Encounter (Addendum)
Lvm asking pt to call back.  Need to know which [facility] location we need to fax order and if pt has a phn # and/or fax #.   [Written rx in basket on Lisa's desk.]

## 2021-07-04 NOTE — Telephone Encounter (Signed)
Faxed written rx to [facility] [facility] at [phone].

## 2021-07-17 ENCOUNTER — Other Ambulatory Visit: Payer: Self-pay | Admitting: Family Medicine

## 2021-07-17 DIAGNOSIS — M810 Age-related osteoporosis without current pathological fracture: Secondary | ICD-10-CM

## 2021-07-17 NOTE — Telephone Encounter (Signed)
Denying this request, patient did not receive her Prolia on time and is not going to need this injection for a few months now, when that time comes I will send RX to them

## 2021-07-18 ENCOUNTER — Ambulatory Visit: Payer: Medicare HMO | Admitting: Family Medicine

## 2021-07-22 DIAGNOSIS — J449 Chronic obstructive pulmonary disease, unspecified: Secondary | ICD-10-CM | POA: Diagnosis not present

## 2021-07-23 ENCOUNTER — Telehealth: Payer: Self-pay | Admitting: Family Medicine

## 2021-07-23 NOTE — Chronic Care Management (AMB) (Signed)
°  Chronic Care Management   Outreach Note  07/23/2021 Name: Christine Serrano MRN: [id number] DOB: Oct 26, 1960  Referred by: Ria Bush, MD Reason for referral : No chief complaint on file.   An unsuccessful telephone outreach was attempted today. The patient was referred to the pharmacist for assistance with care management and care coordination.   Follow Up Plan:   Tatjana Dellinger Upstream Scheduler

## 2021-07-27 ENCOUNTER — Other Ambulatory Visit: Payer: Self-pay | Admitting: Family Medicine

## 2021-07-27 DIAGNOSIS — Z7409 Other reduced mobility: Secondary | ICD-10-CM | POA: Diagnosis not present

## 2021-07-27 DIAGNOSIS — J9611 Chronic respiratory failure with hypoxia: Secondary | ICD-10-CM | POA: Diagnosis not present

## 2021-07-27 DIAGNOSIS — J449 Chronic obstructive pulmonary disease, unspecified: Secondary | ICD-10-CM | POA: Diagnosis not present

## 2021-08-01 ENCOUNTER — Telehealth: Payer: Self-pay

## 2021-08-03 ENCOUNTER — Telehealth: Payer: Self-pay

## 2021-08-03 NOTE — Telephone Encounter (Signed)
I spoke with pt and she is going to [facility] today. Pt prefers not to go to ED so at least she is going to UC to be eval by a provider. Sending note to Dr Darnell Level who is out of office and Gentry Fitz NP who is in office and [place] CMA.

## 2021-08-03 NOTE — Telephone Encounter (Signed)
Noted and agree. 

## 2021-08-06 ENCOUNTER — Telehealth: Payer: Self-pay

## 2021-08-06 NOTE — Telephone Encounter (Signed)
Received duplicate note from access nurse. No record of patient going to UC asinstructed. Patient scheduled for an appointment with Dr. Danise Mina 08/29/21.

## 2021-08-06 NOTE — Telephone Encounter (Signed)
Spoke with patient. Scheduled visit with Latoya NP for Thursday 2/23 @ 10:30am

## 2021-08-06 NOTE — Telephone Encounter (Signed)
Left message on voicemail for patient to cal the office back.

## 2021-08-06 NOTE — Telephone Encounter (Signed)
Spoke with pt asking for update on sxs.  States she still has numbness/tingling and it is about the same, no better but no worse.  Pt still wants to be seen but no appts available when she could come due to her ride's work schedule.  I suggested [facility] in [place].  States she will try to get her ride to take her there.  He gets off work just after midnight tonight.  Fyi to Dr. Einar Pheasant.

## 2021-08-06 NOTE — Telephone Encounter (Signed)
Routing to Triage. Please call to get update on patient regarding symptoms.   If her tingling/numbness has resolved it may be OK to wait but would recommend earlier appointment with another provider

## 2021-08-07 NOTE — Telephone Encounter (Signed)
Appreciate the follow up.

## 2021-08-08 NOTE — Telephone Encounter (Signed)
Phone call placed to patient to offer to schedule visit for 08/09/21 with Palliative care NP. Patient in agreement.

## 2021-08-09 ENCOUNTER — Other Ambulatory Visit: Payer: Self-pay

## 2021-08-09 ENCOUNTER — Other Ambulatory Visit: Payer: Medicare Other | Admitting: Student

## 2021-08-09 DIAGNOSIS — F419 Anxiety disorder, unspecified: Secondary | ICD-10-CM

## 2021-08-09 DIAGNOSIS — Z515 Encounter for palliative care: Secondary | ICD-10-CM | POA: Diagnosis not present

## 2021-08-09 DIAGNOSIS — R630 Anorexia: Secondary | ICD-10-CM

## 2021-08-09 DIAGNOSIS — K5909 Other constipation: Secondary | ICD-10-CM | POA: Diagnosis not present

## 2021-08-09 DIAGNOSIS — R0602 Shortness of breath: Secondary | ICD-10-CM | POA: Diagnosis not present

## 2021-08-09 DIAGNOSIS — F32A Depression, unspecified: Secondary | ICD-10-CM

## 2021-08-09 DIAGNOSIS — J42 Unspecified chronic bronchitis: Secondary | ICD-10-CM | POA: Diagnosis not present

## 2021-08-09 NOTE — Progress Notes (Signed)
[facility] Consult Note Telephone: [phone]  Fax: [phone]    Date of encounter: 08/09/21 10:43 AM PATIENT NAME: Christine Serrano [address] [place] [address]   [phone] (home)  DOB: June 22, 1960 MRN: [id number] PRIMARY CARE PROVIDER:    Ria Bush, MD,  [place] [address] [phone]  REFERRING PROVIDER:   Ria Bush, MD [address] [place],  [place] [address] [phone]  RESPONSIBLE PARTY:    Contact Information     Name Relation Home Work Mobile   [place]   [phone]        I met face to face with patient and family in the home. Palliative Care was asked to follow this patient by consultation request of  Ria Bush, MD to address advance care planning and complex medical decision making. This is a follow up visit.                                   ASSESSMENT AND PLAN / RECOMMENDATIONS:   Advance Care Planning/Goals of Care: Goals include to maximize quality of life and symptom management. Patient/health care surrogate gave his/her permission to discuss. Our advance care planning conversation included a discussion about:    The value and importance of advance care planning  Experiences with loved ones who have been seriously ill or have died  Exploration of personal, cultural or spiritual beliefs that might influence medical decisions  Exploration of goals of care in the event of a sudden injury or illness  CODE STATUS: DNR  Symptom Management/Plan:   COPD-patient reports her breathing has been worse recently; increased shortness of breath with exertion.  She continues to wear oxygen at 4 liters per minute continuously. Continue prn albuterol nebulizer, Breztri inhaler and prednisone 10 mg daily as directed.  Provided on smoking cessation.  Follow up with pulmonology as scheduled.  Generalized weakness-patient is requiring more  assistance with ADLs such as bathing grooming.  She is seeing seeking additional caregiver support in the home.  Will refer to palliative social worker to see if we can fill out PCS application for additional caregiver support in the home.  Appetite-patient endorses fair appetite. She states that she actually does feel hungry but does not always prepare foods.  Continue eating food she enjoys recommended, soft foods.  Nutritional supplement recommended.  Depression and anxiety-Patient endorses feeling more anxious recently.  She is encouraged to take her sertraline, Wellbutrin and mirtazapine as directed she also has Vistaril in the home twice daily as needed for anxiety; She is encouraged to take this as needed.  Constipation-diet with fiber encouraged; restart Linzess every other day.   Follow up Palliative Care Visit: Palliative care will continue to follow for complex medical decision making, advance care planning, and clarification of goals. Return in 8 weeks or prn.   This visit was coded based on medical decision making (MDM).  PPS: 40%  HOSPICE ELIGIBILITY/DIAGNOSIS: TBD  Chief Complaint: Palliative Medicine follow up visit.   HISTORY OF PRESENT ILLNESS:  Christine Serrano is a 61 y.o. 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   Patient needing more assistance with bathing, dressing. Breathing is getting worse here recently. She endorses having increased shortness of breath with exertion. She is wearing 4 lpm continuously.  Sats running about 94-95%. Taking prednisone 10 mg total daily. Appetite has been fair. Most recent weight 104 pounds.  She does endorse constipation; she has not been taking her Linzess or MiraLAX. Denies pain. Feels she has been short tempered here lately due to strain in the home. She feels she is not receiving adequate support from her  roommate, no support with housekeeping. A 10-point review of systems is negative, except for the pertinent positives and negatives detailed in the HPI.   History obtained from review of EMR, discussion with primary team, and interview with family, facility staff/caregiver and/or Christine Serrano.  I reviewed available labs, medications, imaging, studies and related documents from the EMR.  Records reviewed and summarized above.   Physical Exam: Weight 104 pounds Pulse 116, resp 24, b/p 90/60, sats 95-97% on 4 lpm Constitutional: NAD General: frail appearing, thin  EYES: anicteric sclera, lids intact, no discharge  ENMT: intact hearing, oral mucous membranes moist, dentition intact CV: S1S2, RRR, no LE edema Pulmonary: faint inspiratory wheeze R> L, no increased work of breathing, +cough Abdomen: normo-active BS + 4 quadrants, soft and non tender, no ascites GU: deferred MSK: +sarcopenia, moves all extremities, ambulatory Skin: warm and dry, no rashes or wounds on visible skin Neuro: generalized weakness, A & O x 3 Psych: non-anxious affect, pleasant Hem/lymph/immuno: no widespread bruising   Thank you for the opportunity to participate in the care of Christine Serrano.  The palliative care team will continue to follow. Please call our office at [phone] if we can be of additional assistance.   Ezekiel Slocumb, NP   COVID-19 PATIENT SCREENING TOOL Asked and negative response unless otherwise noted:   Have you had symptoms of covid, tested positive or been in contact with someone with symptoms/positive test in the past 5-10 days? No

## 2021-08-15 ENCOUNTER — Ambulatory Visit: Payer: Medicare HMO | Admitting: Family Medicine

## 2021-08-19 DIAGNOSIS — J449 Chronic obstructive pulmonary disease, unspecified: Secondary | ICD-10-CM | POA: Diagnosis not present

## 2021-08-27 ENCOUNTER — Other Ambulatory Visit: Payer: Self-pay

## 2021-08-27 MED ORDER — PREDNISONE 5 MG PO TABS: 10.0000 mg | ORAL_TABLET | Freq: Every day | ORAL | 2 refills | Status: DC

## 2021-08-28 ENCOUNTER — Other Ambulatory Visit: Payer: Self-pay

## 2021-08-28 ENCOUNTER — Ambulatory Visit (INDEPENDENT_AMBULATORY_CARE_PROVIDER_SITE_OTHER): Payer: Medicare Other | Admitting: Pulmonary Disease

## 2021-08-28 ENCOUNTER — Encounter: Payer: Self-pay | Admitting: Pulmonary Disease

## 2021-08-28 ENCOUNTER — Telehealth: Payer: Self-pay

## 2021-08-28 VITALS — BP 100/72 | HR 122 | Temp 98.0°F | Ht 65.0 in | Wt 97.8 lb

## 2021-08-28 DIAGNOSIS — Z9981 Dependence on supplemental oxygen: Secondary | ICD-10-CM | POA: Diagnosis not present

## 2021-08-28 DIAGNOSIS — R64 Cachexia: Secondary | ICD-10-CM | POA: Diagnosis not present

## 2021-08-28 DIAGNOSIS — J209 Acute bronchitis, unspecified: Secondary | ICD-10-CM

## 2021-08-28 DIAGNOSIS — R000 Tachycardia, unspecified: Secondary | ICD-10-CM | POA: Diagnosis not present

## 2021-08-28 DIAGNOSIS — J9611 Chronic respiratory failure with hypoxia: Secondary | ICD-10-CM

## 2021-08-28 DIAGNOSIS — J440 Chronic obstructive pulmonary disease with acute lower respiratory infection: Secondary | ICD-10-CM | POA: Diagnosis not present

## 2021-08-28 DIAGNOSIS — J449 Chronic obstructive pulmonary disease, unspecified: Secondary | ICD-10-CM

## 2021-08-28 DIAGNOSIS — F17210 Nicotine dependence, cigarettes, uncomplicated: Secondary | ICD-10-CM | POA: Diagnosis not present

## 2021-08-28 MED ORDER — METHYLPREDNISOLONE 4 MG PO TBPK: ORAL_TABLET | ORAL | 0 refills | Status: DC

## 2021-08-28 MED ORDER — AMOXICILLIN-POT CLAVULANATE 875-125 MG PO TABS: 1.0000 | ORAL_TABLET | Freq: Two times a day (BID) | ORAL | 0 refills | Status: AC

## 2021-08-28 NOTE — Telephone Encounter (Signed)
Received a call from [facility] Pulmonary nurse who shared that patient has a visit scheduled with Palliative care in April but may benefit from a sooner visit with Palliative care. TC placed to patient. VM left. ?

## 2021-08-28 NOTE — Patient Instructions (Signed)
Hold taking your prednisone while you are on the Medrol for your exacerbation. ? ?We have sent a Medrol dose pack and an antibiotic (Augmentin) to your pharmacy.  Those are to treat your acute bronchitis/exacerbation. ? ? ?See you in follow-up in 3 to 4 weeks time call sooner should any new problems arise.  You will see either me or the nurse practitioner at that time. ?

## 2021-08-28 NOTE — Progress Notes (Signed)
? ?Subjective:  ? ? Patient ID: Christine Serrano, female    DOB: 1960/07/23, 61 y.o.   MRN: [id number] ? ?Patient Care Team: ?Ria Bush, MD as PCP - General (Family Medicine) ? ?Chief Complaint  ?Patient presents with  ? Follow-up  ? ?PT PROFILE: ?61 year old F recalcitrant smoker with severe, oxygen-dependent COPD hospitalized multiple times for COPD exacerbation initially requiring NIPPV in 2017, former patient of Dr. Alva Garnet. ?  ?PROBLEMS: ?Recalcitrant smoker ?Chronic hypoxic respiratory failure ?Very severe COPD/emphysema ?Chronic bronchitis ?Cor Pulmonary ?  ?DATA: ?CT chest 03/13/16: Severe emphysema with extensive disease in the upper lobes and superior segment of the right lower lobe. This could represent acute and/or chronic changes. In addition, there is an irregular spiculated density along the right minor fissure which is indeterminate. ?PFTs 01/15/18: FVC: 1.42 L (48 %pred), FEV1: 0.76 L (31 %pred), FEV1/FVC: 53% , TLC: 4.49 L (94 %pred), DLCO 30 %pred, DLCO/VA 51%. TLC likely underestimated by gas dilution technique. Severe Gas trapping with RV at 172% predicted. ?CT chest 10/13/2019: Severe emphysema particularly upper lobes with upper lobe predominant scarring and architectural distortion no significant change from November 25, 2016 CT.  Coronary artery calcifications and aortic calcification was noted. ?Chest x-ray 10/16/2019: Hyperinflated lungs with diffuse interstitial prominence, no acute infiltrate. ?CT chest 06/07/2020: Severe emphysema, predominantly upper lobes.  Upper lobe predominant scarring and architectural distortion, new right middle lobe atelectasis. ?PET/CT 06/30/2020: No evidence of malignancy, previously noted right middle lobe atelectasis resolved, likely inflammatory/infectious. ?  ?INTERVAL:  ?Last seen 05/30/2021 by me.  Very end-stage COPD.  Most issues now are with panic attacks.  Sputum color change 2 to 3 days ago to green.  No fever ?HPI ?Christine Serrano is a 61 year old current  smoker (0.5 PPD) with very severe/end-stage COPD and chronic respiratory failure due to the same who presents for routine scheduled visit.  She is on oxygen 24/7 at 2 L/min.  She has continued to smoke but has decreased her cigarette use from her last visit from 1 pack a day to half a pack a day.  She notes issues with panic attacks that worsen her shortness of breath.  Also continues to have issues with weight loss.  Continues to note that Judithann Sauger helps her with shortness of breath overall.  She has noted over the last 2 to 3 days some increase in shortness of breath and sputum changing from yellow to green.  No hemoptysis.  No fevers, chills or sweats. ? ?We had goals of care discussion.  We discussed that she may need to switch from palliative care to hospice.  She is still pondering this. ? ?Review of Systems ?A 10 point review of systems was performed and it is as noted above otherwise negative. ? ?Patient Active Problem List  ? Diagnosis Date Noted  ? Pedal edema 02/17/2021  ? 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 Elbow laceration, right, initial encounter 10/24/2020  ? Arm injury, right, initial encounter 10/24/2020  ? Fall with injury 10/24/2020  ? Traumatic hematoma of scalp 10/24/2020  ? MRSA infection 08/23/2020  ? Dyspnea on exertion 08/23/2020  ? Abscess of axilla, left 08/14/2020  ? Low serum vitamin B12 06/09/2020  ? Cervical neck pain with evidence of disc disease 01/29/2020  ? Palliative care status 10/23/2019  ? Impaired mobility 10/20/2019  ? Chronic respiratory failure with hypoxia, on home O2 therapy ([place]) 10/20/2019  ? Memory difficulties 10/16/2019  ? Folate deficiency 09/17/2019  ? Dizziness 09/11/2019  ? IDA (  iron deficiency anemia) 09/11/2019  ? Hypocalcemia 09/11/2019  ? Systolic murmur 46/27/0350  ? Vertebral artery stenosis, left 02/27/2019  ? Medicare annual wellness visit, subsequent 02/25/2019  ? Advanced directives, counseling/discussion 02/25/2019  ? Epistaxis  09/21/2018  ? Osteoporosis 05/23/2018  ? Watery diarrhea 05/08/2018  ? Numbness of right hand 10/10/2017  ? Benign neoplasm of ascending colon   ? Benign neoplasm of transverse colon   ? Benign neoplasm of sigmoid colon   ? Positive hepatitis C antibody test 02/23/2017  ? Encounter for general adult medical examination with abnormal findings 02/21/2017  ? HLD (hyperlipidemia) 02/21/2017  ? Anxiety 12/10/2016  ? Community acquired pneumonia 12/10/2016  ? Tobacco abuse 11/28/2016  ? Aortic atherosclerosis ([place]) 11/26/2016  ? Tachycardia 11/01/2016  ? Pulmonary nodule 10/25/2016  ? MDD (major depressive disorder), recurrent episode, severe ([place]) 07/03/2016  ? Chronic constipation 07/03/2016  ? Alcohol abuse, in remission 07/03/2016  ? COPD, very severe ([place]) 07/03/2016  ? Tremor 07/03/2016  ? Chronic insomnia 07/03/2016  ? ?Social History  ? ?Tobacco Use  ? Smoking status: Every Day  ?  Packs/day: 1.00  ?  Years: 51.00  ?  Pack years: 51.00  ?  Types: Cigarettes  ? Smokeless tobacco: Never  ? Tobacco comments:  ?  Less than 1ppd 08/28/2021  ?Substance Use Topics  ? Alcohol use: Yes  ?  Comment: occasional  ? ?Allergies  ?Allergen Reactions  ? Terbinafine And Related Diarrhea  ?  Diarrheal illness while on terbinafine ?related to med  ? Clarithromycin   ?  Caused Thrush   ? Imitrex [Sumatriptan] Other (See Comments)  ?  Worsens migraine   ? ?Current Meds  ?Medication Sig  ? albuterol (PROVENTIL) (2.5 MG/3ML) 0.083% nebulizer solution Take 3 mLs (2.5 mg total) by nebulization every 6 (six) hours as needed for wheezing or shortness of breath.  ? albuterol (VENTOLIN HFA) 108 (90 Base) MCG/ACT inhaler TAKE 2 PUFFS BY MOUTH EVERY 6 HOURS AS NEEDED FOR WHEEZE OR SHORTNESS OF BREATH  ? amoxicillin-clavulanate (AUGMENTIN) 875-125 MG tablet Take 1 tablet by mouth 2 (two) times daily. One po bid x 7 days  ? Ascorbic Acid (VITAMIN C WITH ROSE HIPS) 500 MG tablet Take 500 mg by mouth daily.  ? aspirin EC 81 MG tablet Take 81 mg by  mouth daily.  ? benzonatate (TESSALON) 100 MG capsule TAKE 1 CAPSULE BY MOUTH THREE TIMES A DAY AS NEEDED FOR COUGH. NOT COVERED BY INSURANCE.  ? Budeson-Glycopyrrol-Formoterol (BREZTRI AEROSPHERE) 160-9-4.8 MCG/ACT AERO Inhale 2 puffs into the lungs in the morning and at bedtime.  ? buPROPion (WELLBUTRIN XL) 150 MG 24 hr tablet Take 1 tablet (150 mg total) by mouth daily.  ? cholecalciferol (VITAMIN D3) 25 MCG (1000 UNIT) tablet Take 1,000 Units by mouth daily.  ? fluticasone (FLONASE) 50 MCG/ACT nasal spray Place 2 sprays into both nostrils daily.  ? folic acid (FOLVITE) 1 MG tablet Take 1 tablet (1 mg total) by mouth daily.  ? gabapentin (NEURONTIN) 100 MG capsule Take 1 capsule (100 mg total) by mouth 2 (two) times daily.  ? hydrOXYzine (ATARAX/VISTARIL) 25 MG tablet Take 1 tablet (25 mg total) by mouth 2 (two) times daily as needed for anxiety.  ? Iron, Ferrous Sulfate, 325 (65 Fe) MG TABS Take 325 mg by mouth daily.  ? LINZESS 290 MCG CAPS capsule Take 1 capsule (290 mcg total) by mouth daily as needed (constipation). TAKE 1 CAPSULE EVERY DAY AS NEEDED FOR CONSTIPATION AS DIRECTED  ?  meclizine (ANTIVERT) 25 MG tablet Take 1 tablet (25 mg total) by mouth 3 (three) times daily as needed for dizziness.  ? methocarbamol (ROBAXIN) 500 MG tablet TAKE 1 TABLET BY MOUTH 3 TIMES DAILY AS NEEDED FOR MUSCLE SPASMS (SEDATION PRECAUTIONS).  ? mirtazapine (REMERON) 7.5 MG tablet Take 1 tablet (7.5 mg total) by mouth at bedtime.  ? Multiple Vitamin (MULTIVITAMIN WITH MINERALS) TABS tablet Take 1 tablet by mouth daily.  ? naproxen (NAPROSYN) 375 MG tablet TAKE 1 TABLET BY MOUTH TWICE A DAY AS NEEDED FOR PAIN WITH FOOD  ? omeprazole (PRILOSEC) 40 MG capsule Take 1 capsule (40 mg total) by mouth daily.  ? OXYGEN Inhale 4 L/min into the lungs continuous.   ? polyethylene glycol powder (GLYCOLAX/MIRALAX) 17 GM/SCOOP powder Take 17 g by mouth daily as needed for moderate constipation.  ? Potassium 99 MG TABS Take by mouth.  ?  predniSONE (DELTASONE) 5 MG tablet Take 2 tablets (10 mg total) by mouth daily with breakfast.  ? sertraline (ZOLOFT) 100 MG tablet Take 1.5 tablets (150 mg total) by mouth daily.  ? thiamine 100 MG tablet Take 1 t

## 2021-08-28 NOTE — Telephone Encounter (Signed)
Called and spoke to [place] at palliative  care who states she will call Ms Gubbels, and get her scheduled.  ?

## 2021-08-29 ENCOUNTER — Ambulatory Visit: Payer: Medicare HMO | Admitting: Family Medicine

## 2021-08-29 ENCOUNTER — Encounter: Payer: Self-pay | Admitting: Pulmonary Disease

## 2021-08-31 ENCOUNTER — Ambulatory Visit (INDEPENDENT_AMBULATORY_CARE_PROVIDER_SITE_OTHER): Payer: Medicare Other | Admitting: Family Medicine

## 2021-08-31 ENCOUNTER — Other Ambulatory Visit: Payer: Self-pay

## 2021-08-31 ENCOUNTER — Telehealth: Payer: Self-pay | Admitting: Pulmonary Disease

## 2021-08-31 ENCOUNTER — Encounter: Payer: Self-pay | Admitting: Family Medicine

## 2021-08-31 VITALS — BP 122/80 | HR 120 | Temp 97.9°F | Ht 65.0 in | Wt 97.0 lb

## 2021-08-31 DIAGNOSIS — Z9981 Dependence on supplemental oxygen: Secondary | ICD-10-CM

## 2021-08-31 DIAGNOSIS — R000 Tachycardia, unspecified: Secondary | ICD-10-CM | POA: Diagnosis not present

## 2021-08-31 DIAGNOSIS — J9611 Chronic respiratory failure with hypoxia: Secondary | ICD-10-CM | POA: Diagnosis not present

## 2021-08-31 DIAGNOSIS — J449 Chronic obstructive pulmonary disease, unspecified: Secondary | ICD-10-CM

## 2021-08-31 DIAGNOSIS — F411 Generalized anxiety disorder: Secondary | ICD-10-CM

## 2021-08-31 DIAGNOSIS — F332 Major depressive disorder, recurrent severe without psychotic features: Secondary | ICD-10-CM | POA: Diagnosis not present

## 2021-08-31 DIAGNOSIS — Z515 Encounter for palliative care: Secondary | ICD-10-CM | POA: Diagnosis not present

## 2021-08-31 DIAGNOSIS — Z720 Tobacco use: Secondary | ICD-10-CM | POA: Diagnosis not present

## 2021-08-31 DIAGNOSIS — F410 Panic disorder [episodic paroxysmal anxiety] without agoraphobia: Secondary | ICD-10-CM

## 2021-08-31 DIAGNOSIS — E538 Deficiency of other specified B group vitamins: Secondary | ICD-10-CM | POA: Diagnosis not present

## 2021-08-31 DIAGNOSIS — R202 Paresthesia of skin: Secondary | ICD-10-CM | POA: Diagnosis not present

## 2021-08-31 DIAGNOSIS — R64 Cachexia: Secondary | ICD-10-CM

## 2021-08-31 MED ORDER — MIRTAZAPINE 15 MG PO TABS: 15.0000 mg | ORAL_TABLET | Freq: Every day | ORAL | 1 refills | Status: DC

## 2021-08-31 NOTE — Progress Notes (Addendum)
? ? Patient ID: Christine Serrano, female    DOB: 22-Mar-1961, 61 y.o.   MRN: [id number] ? ?This visit was conducted in person. ? ?BP 122/80   Pulse (!) 120   Temp 97.9 ?F (36.6 ?C) (Temporal)   Ht '5\' 5"'$  (1.651 m)   Wt 97 lb (44 kg)   LMP  (LMP Unknown)   SpO2 98% Comment: 4 L  BMI 16.14 kg/m?   ? ?CC: R arm numbness ?Subjective:  ? ?HPI: ?Christine Serrano is a 61 y.o. female presenting on 08/31/2021 for Numbness (C/o numbness/tingling in R arm.  Started about 2 wks ago. Also, wants to discuss morphine.) ? ? ?2 wk h/o R arm numbness and paresthesias that starts in shoulder and shoots down arm to entire hand. No significant pain. Known chronic neck pain. Denies inciting trauma/injury or fall. Some better in the last few days. She continues taking vit B12 1064mg and thiamine '100mg'$  daily. Very little alcohol - drinks 1 large cup of mango-rita with ice daily.  ? ?She did have fall 10/2020 s/p ER eval with head CT at that time showing large R parieto-occipital scalp hematoma. ? ?Known severe end-stage COPD closely followed by pulm last seen earlier this week - current acute bronchitis treating with augmentin course and medrol dosepak in place of daily prednisone. She hasn't yet started medrol dosepak. She continues breztri 2 puffs bid. She has cut down on cigarettes from 1 ppd to 1/2 ppd. Continued weight loss noted. They discussed hospice. We again discussed this today - she agrees to hospice referral.  ? ?Notes worsening panic attacks - that worsen dyspnea. This is despite wellbutrin XL '150mg'$  daily, hydroxyzine '25mg'$  bid prn (using regularly), sertraline '150mg'$  daily, mirtazapine 7.'5mg'$  nightly (notes oversedation).  ?   ? ?Relevant past medical, surgical, family and social history reviewed and updated as indicated. Interim medical history since our last visit reviewed. ?Allergies and medications reviewed and updated. ?Outpatient Medications Prior to Visit  ?Medication Sig Dispense Refill  ? albuterol (PROVENTIL) (2.5  MG/3ML) 0.083% nebulizer solution Take 3 mLs (2.5 mg total) by nebulization every 6 (six) hours as needed for wheezing or shortness of breath. 150 mL 6  ? albuterol (VENTOLIN HFA) 108 (90 Base) MCG/ACT inhaler TAKE 2 PUFFS BY MOUTH EVERY 6 HOURS AS NEEDED FOR WHEEZE OR SHORTNESS OF BREATH 25.5 each 2  ? amoxicillin-clavulanate (AUGMENTIN) 875-125 MG tablet Take 1 tablet by mouth 2 (two) times daily for 10 days. 20 tablet 0  ? Ascorbic Acid (VITAMIN C WITH ROSE HIPS) 500 MG tablet Take 500 mg by mouth daily.    ? aspirin EC 81 MG tablet Take 81 mg by mouth daily.    ? benzonatate (TESSALON) 100 MG capsule TAKE 1 CAPSULE BY MOUTH THREE TIMES A DAY AS NEEDED FOR COUGH. NOT COVERED BY INSURANCE. 30 capsule 11  ? Budeson-Glycopyrrol-Formoterol (BREZTRI AEROSPHERE) 160-9-4.8 MCG/ACT AERO Inhale 2 puffs into the lungs in the morning and at bedtime. 32.1 g 3  ? buPROPion (WELLBUTRIN XL) 150 MG 24 hr tablet Take 1 tablet (150 mg total) by mouth daily. 90 tablet 1  ? cholecalciferol (VITAMIN D3) 25 MCG (1000 UNIT) tablet Take 1,000 Units by mouth daily.    ? fluticasone (FLONASE) 50 MCG/ACT nasal spray Place 2 sprays into both nostrils daily. 48 g 3  ? folic acid (FOLVITE) 1 MG tablet Take 1 tablet (1 mg total) by mouth daily. 90 tablet 1  ? gabapentin (NEURONTIN) 100 MG capsule Take 1 capsule (  100 mg total) by mouth 2 (two) times daily. 180 capsule 1  ? hydrOXYzine (ATARAX/VISTARIL) 25 MG tablet Take 1 tablet (25 mg total) by mouth 2 (two) times daily as needed for anxiety. 60 tablet 6  ? Iron, Ferrous Sulfate, 325 (65 Fe) MG TABS Take 325 mg by mouth daily. 30 tablet   ? LINZESS 290 MCG CAPS capsule Take 1 capsule (290 mcg total) by mouth daily as needed (constipation). TAKE 1 CAPSULE EVERY DAY AS NEEDED FOR CONSTIPATION AS DIRECTED 90 capsule 1  ? meclizine (ANTIVERT) 25 MG tablet Take 1 tablet (25 mg total) by mouth 3 (three) times daily as needed for dizziness. 30 tablet 0  ? methocarbamol (ROBAXIN) 500 MG tablet TAKE 1  TABLET BY MOUTH 3 TIMES DAILY AS NEEDED FOR MUSCLE SPASMS (SEDATION PRECAUTIONS). 40 tablet 3  ? methylPREDNISolone (MEDROL DOSEPAK) 4 MG TBPK tablet Take as directed in the package, this is a taper pack. 21 tablet 0  ? Multiple Vitamin (MULTIVITAMIN WITH MINERALS) TABS tablet Take 1 tablet by mouth daily.    ? naproxen (NAPROSYN) 375 MG tablet TAKE 1 TABLET BY MOUTH TWICE A DAY AS NEEDED FOR PAIN WITH FOOD 30 tablet 1  ? omeprazole (PRILOSEC) 40 MG capsule Take 1 capsule (40 mg total) by mouth daily. 90 capsule 1  ? OXYGEN Inhale 4 L/min into the lungs continuous.     ? polyethylene glycol powder (GLYCOLAX/MIRALAX) 17 GM/SCOOP powder Take 17 g by mouth daily as needed for moderate constipation. 3350 g 1  ? Potassium 99 MG TABS Take by mouth.    ? sertraline (ZOLOFT) 100 MG tablet Take 1.5 tablets (150 mg total) by mouth daily. 135 tablet 1  ? thiamine 100 MG tablet Take 1 tablet (100 mg total) by mouth daily. 30 tablet 0  ? vitamin B-12 (CYANOCOBALAMIN) 1000 MCG tablet Take 1 tablet (1,000 mcg total) by mouth daily.    ? mirtazapine (REMERON) 7.5 MG tablet Take 1 tablet (7.5 mg total) by mouth at bedtime. 90 tablet 1  ? predniSONE (DELTASONE) 5 MG tablet Take 2 tablets (10 mg total) by mouth daily with breakfast. (Patient not taking: Reported on 08/31/2021) 60 tablet 2  ? ?No facility-administered medications prior to visit.  ?  ? ?Per HPI unless specifically indicated in ROS section below ?Review of Systems ? ?Objective:  ?BP 122/80   Pulse (!) 120   Temp 97.9 ?F (36.6 ?C) (Temporal)   Ht '5\' 5"'$  (1.651 m)   Wt 97 lb (44 kg)   LMP  (LMP Unknown)   SpO2 98% Comment: 4 L  BMI 16.14 kg/m?   ?Wt Readings from Last 3 Encounters:  ?08/31/21 97 lb (44 kg)  ?08/28/21 97 lb 12.8 oz (44.4 kg)  ?06/25/21 103 lb 7 oz (46.9 kg)  ?  ?  ?Physical Exam ?Vitals and nursing note reviewed.  ?Constitutional:   ?   Appearance: She is ill-appearing.  ?   Comments: In wheelchair with supplemental O2 through [place]  ?Cardiovascular:  ?    Rate and Rhythm: Regular rhythm. Tachycardia present.  ?   Pulses: Normal pulses.  ?   Heart sounds: Normal heart sounds.  ?Pulmonary:  ?   Effort: No respiratory distress.  ?   Breath sounds: Wheezing (mild insp/exp wheezing) and rhonchi present. No rales.  ?   Comments: Coarse breath sounds ?Musculoskeletal:  ?   Right lower leg: No edema.  ?   Left lower leg: No edema.  ?   Comments:  ?  FROM cervical neck with discomfort with rotation to right ?No midline cervical spine tenderness  ?Skin: ?   Coloration: Skin is pale.  ?Neurological:  ?   Sensory: Sensation is intact.  ?   Motor: Motor function is intact.  ?   Comments:  ?Grip strength intact ?Preserved strength BUE ?Sensation intact bilateral upper extremities to hands  ? ?   ?Results for orders placed or performed in visit on 06/25/21  ?IBC panel  ?Result Value Ref Range  ? Iron 13 (L) 42 - 145 ug/dL  ? Transferrin 359.0 212.0 - 360.0 mg/dL  ? Saturation Ratios 2.6 (L) 20.0 - 50.0 %  ? TIBC 502.6 (H) 250.0 - 450.0 mcg/dL  ?Ferritin  ?Result Value Ref Range  ? Ferritin 7.4 (L) 10.0 - 291.0 ng/mL  ?CBC with Differential/Platelet  ?Result Value Ref Range  ? WBC 8.5 4.0 - 10.5 K/uL  ? RBC 3.92 3.87 - 5.11 Mil/uL  ? Hemoglobin 10.2 (L) 12.0 - 15.0 g/dL  ? HCT 32.0 (L) 36.0 - 46.0 %  ? MCV 81.5 78.0 - 100.0 fl  ? MCHC 31.8 30.0 - 36.0 g/dL  ? RDW 15.1 11.5 - 15.5 %  ? Platelets 224.0 150.0 - 400.0 K/uL  ? Neutrophils Relative % 65.7 43.0 - 77.0 %  ? Lymphocytes Relative 24.2 12.0 - 46.0 %  ? Monocytes Relative 8.8 3.0 - 12.0 %  ? Eosinophils Relative 0.9 0.0 - 5.0 %  ? Basophils Relative 0.4 0.0 - 3.0 %  ? Neutro Abs 5.6 1.4 - 7.7 K/uL  ? Lymphs Abs 2.1 0.7 - 4.0 K/uL  ? Monocytes Absolute 0.7 0.1 - 1.0 K/uL  ? Eosinophils Absolute 0.1 0.0 - 0.7 K/uL  ? Basophils Absolute 0.0 0.0 - 0.1 K/uL  ?Comprehensive metabolic panel  ?Result Value Ref Range  ? Sodium 141 135 - 145 mEq/L  ? Potassium 4.0 3.5 - 5.1 mEq/L  ? Chloride 100 96 - 112 mEq/L  ? CO2 36 (H) 19 - 32 mEq/L   ? Glucose, Bld 71 70 - 99 mg/dL  ? BUN 7 6 - 23 mg/dL  ? Creatinine, Ser 0.52 0.40 - 1.20 mg/dL  ? Total Bilirubin 0.3 0.2 - 1.2 mg/dL  ? Alkaline Phosphatase 95 39 - 117 U/L  ? AST 15 0 - 37 U/L  ? ALT 7 0 - 35 U

## 2021-08-31 NOTE — Assessment & Plan Note (Signed)
Oxygen dependent, continued smoker. Appreciate pulm care. Continue breztri 2 puffs BID with PRN alb inh/neb.  ?She is currently undergoing treatment for COPD exacerbation through pulmonology with medrol dosepak and augmentin course.  ?She continues daily prednisone.  ?She likely qualifies for hospice care and is interested in further pursuing this - will place outpatient community hospice referral.  ?

## 2021-08-31 NOTE — Assessment & Plan Note (Signed)
She has cut down smoking to 1/2 ppd.  ?

## 2021-08-31 NOTE — Assessment & Plan Note (Signed)
She continues oral vit B12, thiamine, folate and iron replacement.  ?

## 2021-08-31 NOTE — Telephone Encounter (Signed)
ATC patient in regards to her questions regarding medications. Will try again later.  ?

## 2021-08-31 NOTE — Assessment & Plan Note (Signed)
Chronic, ongoing.  

## 2021-08-31 NOTE — Assessment & Plan Note (Signed)
Anticipate qualifies for hospice care due to end stage oxygen dependent COPD, cor pulmonale, pulmonary cachexia. Reviewed possible referral for this for further assistance - she agrees. Will ask hospice team to come out to her home for evaluation/intake.  ?

## 2021-08-31 NOTE — Assessment & Plan Note (Signed)
Describes intermittent paresthesia to entire R arm into hands. Overall reassuring neck exam - doubt related to cervical impingement, radiculopathy. ?related to vitamin deficiency however she continues regularly taking her b12, b1, folate. Seems better in the last few days. Await effect of medrol dosepak she hasn't started yet. Consider vitamin evaluation if ongoing symptoms.  ?

## 2021-08-31 NOTE — Telephone Encounter (Signed)
Called and spoke to patient who needed clarification on medication. Told her to hold off on prednisone until completed with medrol dose pack. Nothing further needed.  ?

## 2021-08-31 NOTE — Patient Instructions (Addendum)
Go ahead and start medrol dosepak in place of prednisone.  ?Increase mirtazapine to $RemoveBefore'15mg'[id number]$  nightly - you can take 2 7.$Remove'5mg'TRASCts$  tablets until you run out.  ?Monitor R arm symptoms for now - I'd like to see effect on medrol dosepak. If ongoing, we will check further labwork  ?Pass by lab to pick up stool kit.  ?I will ask hospice team to come out to the house for evaluation.  ?Return in 3 months for follow up visit.  ?

## 2021-08-31 NOTE — Assessment & Plan Note (Addendum)
Chronic on sertraline '150mg'$ , wellbutrin XL '150mg'$  daily, with recent addition of mirtazapine 7.'5mg'$  which she is tolerating well but notes significant sedation - will increase to '15mg'$  to decrease hypnotic effect. Consider taper of antidepressants pending decisions below.  ?Continue hydroxyzine bid PRN. ?

## 2021-08-31 NOTE — Assessment & Plan Note (Signed)
Managing with PRN hydroxyzine plus antidepressant regimen. Dyspnea drives anxiety. Briefly discussed option of opiate to treat air hunger.  ?

## 2021-09-01 DIAGNOSIS — J9611 Chronic respiratory failure with hypoxia: Secondary | ICD-10-CM | POA: Diagnosis not present

## 2021-09-01 DIAGNOSIS — J449 Chronic obstructive pulmonary disease, unspecified: Secondary | ICD-10-CM | POA: Diagnosis not present

## 2021-09-01 DIAGNOSIS — Z7409 Other reduced mobility: Secondary | ICD-10-CM | POA: Diagnosis not present

## 2021-09-02 DIAGNOSIS — J9611 Chronic respiratory failure with hypoxia: Secondary | ICD-10-CM | POA: Diagnosis not present

## 2021-09-02 DIAGNOSIS — J449 Chronic obstructive pulmonary disease, unspecified: Secondary | ICD-10-CM | POA: Diagnosis not present

## 2021-09-02 DIAGNOSIS — Z7409 Other reduced mobility: Secondary | ICD-10-CM | POA: Diagnosis not present

## 2021-09-03 ENCOUNTER — Telehealth: Payer: Self-pay

## 2021-09-03 NOTE — Telephone Encounter (Signed)
Spoke with [facility] her Dr. Darnell Level agrees to be attending for pt.  Verbalizes understanding and she will document in pt's chart.  ?

## 2021-09-03 NOTE — Telephone Encounter (Signed)
[place] for me to be attending. Thank you.  ?

## 2021-09-03 NOTE — Telephone Encounter (Signed)
Received call from Hospice Patient would like Dr. Darnell Level to be attending for patient.  ? ?Contact number for [facility]  ?[phone] ?

## 2021-09-04 DIAGNOSIS — Z7409 Other reduced mobility: Secondary | ICD-10-CM | POA: Diagnosis not present

## 2021-09-04 DIAGNOSIS — J449 Chronic obstructive pulmonary disease, unspecified: Secondary | ICD-10-CM | POA: Diagnosis not present

## 2021-09-04 DIAGNOSIS — J9611 Chronic respiratory failure with hypoxia: Secondary | ICD-10-CM | POA: Diagnosis not present

## 2021-09-05 ENCOUNTER — Other Ambulatory Visit (INDEPENDENT_AMBULATORY_CARE_PROVIDER_SITE_OTHER): Payer: Medicare Other

## 2021-09-05 DIAGNOSIS — D509 Iron deficiency anemia, unspecified: Secondary | ICD-10-CM

## 2021-09-05 LAB — FECAL OCCULT BLOOD, IMMUNOCHEMICAL: Fecal Occult Bld: NEGATIVE

## 2021-09-08 ENCOUNTER — Other Ambulatory Visit: Payer: Self-pay | Admitting: Family Medicine

## 2021-09-19 ENCOUNTER — Telehealth: Payer: Self-pay | Admitting: Student

## 2021-09-19 NOTE — Telephone Encounter (Signed)
Spoke with patient regarding the Palliative referral and she was in agreement with scheduling visit.  I have scheduled a Telephonic Consult for 09/25/21 @ 1:30 PM ?

## 2021-09-25 ENCOUNTER — Other Ambulatory Visit: Payer: Medicare Other | Admitting: Student

## 2021-09-25 DIAGNOSIS — Z515 Encounter for palliative care: Secondary | ICD-10-CM | POA: Diagnosis not present

## 2021-09-25 DIAGNOSIS — J449 Chronic obstructive pulmonary disease, unspecified: Secondary | ICD-10-CM

## 2021-09-25 DIAGNOSIS — R638 Other symptoms and signs concerning food and fluid intake: Secondary | ICD-10-CM

## 2021-09-25 DIAGNOSIS — F32A Depression, unspecified: Secondary | ICD-10-CM

## 2021-09-25 DIAGNOSIS — J302 Other seasonal allergic rhinitis: Secondary | ICD-10-CM

## 2021-09-25 DIAGNOSIS — R531 Weakness: Secondary | ICD-10-CM

## 2021-09-25 NOTE — Progress Notes (Signed)
? ? ?Manufacturing engineer ?Community Palliative Care Consult Note ?Telephone: [phone]  ?Fax: [phone]  ? ?Date of encounter: 09/25/21 ?1:32 PM ?PATIENT NAME: Christine Serrano ?[place] [address]   ?[id number] (home)  ?DOB: [birth year] ?MRN: [id number] ?PRIMARY CARE PROVIDER:    ?Ria Bush, MD,  ?[address] [place] [address] ?[phone] ? ?REFERRING PROVIDER:   ?Ria Bush, MD ?[address] ?[place],  [place] [address] ?[phone] ? ?RESPONSIBLE PARTY:    ?Contact Information   ? ? Name Relation Home Work Mobile  ? Smith,Angie Roommate   [phone]  ? ?  ? ? ?Telehealth statement: Due to the COVID-19 crisis, this home visit was done via telephone due to the patient?s inability to connect via an audiovisual connection or their refusal to have an in-person visit. This  connection method was agreed to by the patient/family.  ? ? ? ?                                 ASSESSMENT AND PLAN / RECOMMENDATIONS:  ? ?Advance Care Planning/Goals of Care: Goals include to maximize quality of life and symptom management. Patient/health care surrogate gave his/her permission to discuss.Our advance care planning conversation included a discussion about:    ?The value and importance of advance care planning  ?Experiences with loved ones who have been seriously ill or have died  ?Exploration of personal, cultural or spiritual beliefs that might influence medical decisions  ?Exploration of goals of care in the event of a sudden injury or illness  ?CODE STATUS: DNR ? ?Education provided on Palliative Medicine vs. Hospice services. Patient is open to hospitalization as needed. Patient recently revoked hospice services due to not being ready for hospice at this time. Palliative medicine will continue to follow, provide ongoing support and symptom management. Will refer back to hospice should she decline further and is ready for hospice philosophy, comfort measures.   ? ?Symptom Management/Plan: ? ?COPD-currently wearing oxygen at 6 lpm; increased due to length of tubing. She is going to titrate back down to 5.5, then 5 lpm. Continue  Breztri inhaler BID, albuterol nebulizer as directed, prednisone 10 mg daily. ? ?Generalized weakness-requires assistance with adl's; reports having good days and bad days. Family to assist with adl's as needed.  ? ?Allergic rhinitis- start loratadine 10 mg daily.  ? ?Appetite-patient states her appetite has been good; no weight loss reported. Encouraged to eat foods she enjoys, nutritional supplement 1-2 times daily.  ? ?Depression and anxiety-mood has been stable. Continue sertraline, Wellbutrin and mirtazapine as directed. Mirtazapine recently increased to 15 mg QHS. ? ?Follow up Palliative Care Visit: Palliative care will continue to follow for complex medical decision making, advance care planning, and clarification of goals. Return in 4-6 weeks or prn. ? ? ?This visit was coded based on medical decision making (MDM). ? ?PPS: 40% ? ?HOSPICE ELIGIBILITY/DIAGNOSIS: TBD ? ?Chief Complaint: Palliative Medicine initial consult.  ? ?HISTORY OF PRESENT ILLNESS:  Christine Serrano is a 61 y.o. 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   ? ?Patient revoked hospice services due to not being ready yet for hospice services. She does not want to take away her scooter or portable oxygen yet. Currently wearing oxygen at 6 lpm; states she increased due to using longer tubing. Shortness  of with rest and exertion. Appetite has been fair; drinking 1-2 boost a day. Weight 97 pounds. Denies pain, constipation or nausea. Sleeping well. Denies falls. Treated for bronchitis one month ago.  ? ?History obtained from review of EMR, discussion with primary team, and interview with family, facility staff/caregiver and/or Christine Serrano.  ?I reviewed  available labs, medications, imaging, studies and related documents from the EMR.  Records reviewed and summarized above.  ? ?ROS ? ?General: NAD ?EYES: denies vision changes ?ENMT: denies dysphagia ?Cardiovascular: denies chest pain,  DOE ?Pulmonary: +cough, denies increased SOB ?Abdomen: endorses good appetite, denies constipation, endorses continence of bowel ?GU: endorses continence of urine ?MSK: + weakness,  no falls reported ?Skin: denies rashes or wounds ?Neurological: denies pain, denies insomnia ?Psych: Endorses stable mood ?Heme/lymph/immuno: denies bruises, abnormal bleeding ? ?Physical Exam: ? ?PE deferred due to this being a telephonic visit.  ? ?CURRENT PROBLEM LIST:  ?Patient Active Problem List  ? Diagnosis Date Noted  ? Arm paresthesia, right 08/31/2021  ? Pedal edema 02/17/2021  ? 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 Arm injury, right, initial encounter 10/24/2020  ? Fall with injury 10/24/2020  ? Traumatic hematoma of scalp 10/24/2020  ? MRSA infection 08/23/2020  ? Dyspnea on exertion 08/23/2020  ? Abscess of axilla, left 08/14/2020  ? Low serum vitamin B12 06/09/2020  ? Cervical neck pain with evidence of disc disease 01/29/2020  ? Palliative care status 10/23/2019  ? Impaired mobility 10/20/2019  ? Chronic respiratory failure with hypoxia, on home O2 therapy ([place]) 10/20/2019  ? Memory difficulties 10/16/2019  ? Folate deficiency 09/17/2019  ? Dizziness 09/11/2019  ? IDA (iron deficiency anemia) 09/11/2019  ? Hypocalcemia 09/11/2019  ? Systolic murmur 13/24/4010  ? Vertebral artery stenosis, left 02/27/2019  ? Medicare annual wellness visit, subsequent 02/25/2019  ? Advanced directives, counseling/discussion 02/25/2019  ? Epistaxis 09/21/2018  ? Osteoporosis 05/23/2018  ? Watery diarrhea 05/08/2018  ? Numbness of right hand 10/10/2017  ? Benign neoplasm of ascending colon   ? Benign neoplasm of transverse colon   ? Benign neoplasm of sigmoid colon   ? Positive hepatitis C antibody  test 02/23/2017  ? Encounter for general adult medical examination with abnormal findings 02/21/2017  ? HLD (hyperlipidemia) 02/21/2017  ? Generalized anxiety disorder with panic attacks 12/10/2016  ? Community acquired pneumonia 12/10/2016  ? Tobacco abuse 11/28/2016  ? Aortic atherosclerosis ([facility]) 11/26/2016  ? Tachycardia 11/01/2016  ? Pulmonary nodule 10/25/2016  ? MDD (major depressive disorder), recurrent episode, severe ([place]) 07/03/2016  ? Chronic constipation 07/03/2016  ? Alcohol abuse, in remission 07/03/2016  ? COPD, very severe ([place]) 07/03/2016  ? Tremor 07/03/2016  ? Chronic insomnia 07/03/2016  ? ?PAST MEDICAL HISTORY:  ?Active Ambulatory Problems  ?  Diagnosis Date Noted  ? MDD (major depressive disorder), recurrent episode, severe ([facility] [facility]) 07/03/2016  ? Chronic constipation 07/03/2016  ? Alcohol abuse, in remission 07/03/2016  ? COPD, very severe ([place]) 07/03/2016  ? Tremor 07/03/2016  ? Chronic insomnia 07/03/2016  ? Pulmonary nodule 10/25/2016  ? Tachycardia 11/01/2016  ? Aortic atherosclerosis ([place]) 11/26/2016  ? Tobacco abuse 11/28/2016  ? Generalized anxiety disorder with panic attacks 12/10/2016  ? Community acquired pneumonia 12/10/2016  ? Encounter for general adult medical examination with abnormal findings 02/21/2017  ? HLD (hyperlipidemia) 02/21/2017  ? Positive hepatitis C antibody test 02/23/2017  ? Benign neoplasm of ascending colon   ? Benign neoplasm of transverse colon   ? Benign neoplasm of sigmoid colon   ?  Numbness of right hand 10/10/2017  ? Watery diarrhea 05/08/2018  ? Osteoporosis 05/23/2018  ? Epistaxis 09/21/2018  ? Medicare annual wellness visit, subsequent 02/25/2019  ? Advanced directives, counseling/discussion 02/25/2019  ? Systolic murmur 04/18/1116  ? Vertebral artery stenosis, left 02/27/2019  ? Dizziness 09/11/2019  ? IDA (iron deficiency anemia) 09/11/2019  ? Hypocalcemia 09/11/2019  ? Folate deficiency 09/17/2019  ? Memory difficulties 10/16/2019  ? Impaired mobility  10/20/2019  ? Chronic respiratory failure with hypoxia, on home O2 therapy ([facility] [place]) 10/20/2019  ? Palliative care status 10/23/2019  ? Cervical neck pain with evidence of disc disease 01/29/2020  ? Low seru

## 2021-09-27 ENCOUNTER — Other Ambulatory Visit: Payer: Medicare Other | Admitting: Student

## 2021-10-01 ENCOUNTER — Telehealth: Payer: Self-pay | Admitting: Pulmonary Disease

## 2021-10-01 DIAGNOSIS — J449 Chronic obstructive pulmonary disease, unspecified: Secondary | ICD-10-CM

## 2021-10-01 NOTE — Telephone Encounter (Signed)
Spoke to patient. ?She stated that her current nebulizer machine is broken.  ?She would like an order sent to [facility]. ?Our office did not originally order machine.  ? ?Dr. Patsey Berthold, please advise. thanks ?

## 2021-10-01 NOTE — Telephone Encounter (Signed)
Okay to send prescription for new nebulizer machine. ?

## 2021-10-02 DIAGNOSIS — J449 Chronic obstructive pulmonary disease, unspecified: Secondary | ICD-10-CM | POA: Diagnosis not present

## 2021-10-02 DIAGNOSIS — J9611 Chronic respiratory failure with hypoxia: Secondary | ICD-10-CM | POA: Diagnosis not present

## 2021-10-02 DIAGNOSIS — Z7409 Other reduced mobility: Secondary | ICD-10-CM | POA: Diagnosis not present

## 2021-10-02 NOTE — Telephone Encounter (Signed)
Order placed to [facility] for neb machine.  ?Patient is aware and voiced her understanding.  ?Nothing further needed.  ? ?

## 2021-10-03 DIAGNOSIS — J449 Chronic obstructive pulmonary disease, unspecified: Secondary | ICD-10-CM | POA: Diagnosis not present

## 2021-10-03 DIAGNOSIS — Z7409 Other reduced mobility: Secondary | ICD-10-CM | POA: Diagnosis not present

## 2021-10-03 DIAGNOSIS — J9611 Chronic respiratory failure with hypoxia: Secondary | ICD-10-CM | POA: Diagnosis not present

## 2021-10-09 ENCOUNTER — Ambulatory Visit: Payer: Medicare Other | Admitting: Adult Health

## 2021-10-18 ENCOUNTER — Other Ambulatory Visit: Payer: Medicare Other | Admitting: Student

## 2021-10-18 DIAGNOSIS — J449 Chronic obstructive pulmonary disease, unspecified: Secondary | ICD-10-CM | POA: Diagnosis not present

## 2021-10-18 DIAGNOSIS — Z515 Encounter for palliative care: Secondary | ICD-10-CM

## 2021-10-18 DIAGNOSIS — R638 Other symptoms and signs concerning food and fluid intake: Secondary | ICD-10-CM

## 2021-10-18 DIAGNOSIS — F32A Anxiety disorder, unspecified: Secondary | ICD-10-CM

## 2021-10-18 NOTE — Progress Notes (Signed)
? ? ?Manufacturing engineer ?Community Palliative Care Consult Note ?Telephone: [phone]  ?Fax: [phone]  ? ? ?Date of encounter: 10/18/21 ?1:18 PM ?PATIENT NAME: Christine Serrano ?[place] [address]   ?[phone] (home)  ?DOB: 05-15-1961 ?MRN: [id number] ?PRIMARY CARE PROVIDER:    ?Christine Bush, MD,  ?[address] [place] [address] ?[phone] ? ?REFERRING PROVIDER:   ?Christine Bush, MD ?[place] ?[place],  [place] [address] ?[phone] ? ?RESPONSIBLE PARTY:    ?Contact Information   ? ? Name Relation Home Work Mobile  ? Serrano,Christine Roommate   [phone]  ? ?  ? ? ? ?I met face to face with patient and family in the home. Palliative Care was asked to follow this patient by consultation request of  Christine Bush, MD to address advance care planning and complex medical decision making. This is a follow up visit. ? ?                                 ASSESSMENT AND PLAN / RECOMMENDATIONS:  ? ?Advance Care Planning/Goals of Care: Goals include to maximize quality of life and symptom management. Patient/health care surrogate gave his/her permission to discuss. ?CODE STATUS: DNR ? ?Symptom Management/Plan: ? ?COPD-breathing has been stable. No recent exacerbations. Patient is currently wearing oxygen at 4 lpm. Continue  Breztri inhaler BID, albuterol nebulizer as directed, prednisone 10 mg daily. Follow up with pulmonology as scheduled. ? ?Appetite-patient states her appetite has been good; she has gained some weight up to 102.7 pounds. Encouraged to eat foods she enjoys, nutritional supplement 1-2 times daily. ? ?Depression and anxiety-mood has been stable. Continue sertraline, Wellbutrin and mirtazapine as directed.  ? ?Follow up Palliative Care Visit: Palliative care will continue to follow for complex medical decision making, advance care planning, and clarification of goals. Return in 6-8 weeks or prn. ? ?This visit was coded based on medical  decision making (MDM). ? ?PPS: 40% ? ?HOSPICE ELIGIBILITY/DIAGNOSIS: TBD ? ?Chief Complaint: Palliative Medicine follow up visit.  ? ?HISTORY OF PRESENT ILLNESS:  Christine Serrano is a 61 y.o. 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     ? ?Reports doing well; breathing is stable. She has backed down to 4 lpm. She denies pain, nausea, constipation. Sleep is fair. Denies falls; feels she has been a little more active. Weight is up to 102.7 pounds. Repots skin lesions on bilateral arms from her dogs. Endorses a good mood. A 10 point ROS is negative, except for pertinent positives and negatives detailed per the HPI.  ? ? ?History obtained from review of EMR, discussion with primary team, and interview with family, facility staff/caregiver and/or Christine Serrano.  ?I reviewed available labs, medications, imaging, studies and related documents from the EMR.  Records reviewed and summarized above.  ? ? ?Physical Exam: ?Pulse 120, resp 22, b/p 116/78, sat 97% on 4 lpm ?Constitutional: NAD ?General: frail appearing, thin ?EYES: anicteric sclera, lids intact, no discharge  ?ENMT: intact hearing, oral mucous membranes moist, dentition intact ?CV: S1S2, RRR, no LE edema ?Pulmonary: LCTA, occasional cough ?Abdomen: normo-active BS + 4 quadrants, soft and non tender, no ascites ?GU: deferred ?MSK: + sarcopenia, moves all extremities, ambulatory ?Skin: warm and dry, no rashes or wounds on visible skin ?Neuro:  no generalized weakness,  no  cognitive impairment ?Psych: non-anxious affect, A and O x 3 ?Hem/lymph/immuno: no widespread bruising ? ? ?Thank you for the opportunity to participate in the care of Christine Serrano.  The palliative care team will continue to follow. Please call our office at [phone] if we can be of additional assistance.  ? ?Ezekiel Slocumb, NP  ? ?COVID-19 PATIENT SCREENING TOOL ?Asked  and negative response unless otherwise noted:  ? ?Have you had symptoms of covid, tested positive or been in contact with someone with symptoms/positive test in the past 5-10 days? No ? ?

## 2021-11-01 DIAGNOSIS — Z7409 Other reduced mobility: Secondary | ICD-10-CM | POA: Diagnosis not present

## 2021-11-01 DIAGNOSIS — J449 Chronic obstructive pulmonary disease, unspecified: Secondary | ICD-10-CM | POA: Diagnosis not present

## 2021-11-01 DIAGNOSIS — J9611 Chronic respiratory failure with hypoxia: Secondary | ICD-10-CM | POA: Diagnosis not present

## 2021-11-13 ENCOUNTER — Telehealth: Payer: Self-pay | Admitting: Family Medicine

## 2021-11-13 NOTE — Telephone Encounter (Signed)
Pt called to try and schedule appt, she said for about a week she has had vomiting and diarrhea and the vomit is clear and the diarrhea has been black. I transferred her to the triage nurse for further eval

## 2021-11-13 NOTE — Telephone Encounter (Signed)
[facility] - Client TELEPHONE ADVICE RECORD [facility] Patient Name: Christine Serrano QJJHE Gender: Female DOB: 05/30/1961 Age: 61 Y 10 M 10 D Return Phone Number: [phone] (Primary) Address: City/ State/ Zip: [place] [address] Client [facility] - Client Client Site [place] Provider Ria Bush - MD Contact Type Call Who Is Calling Patient / Member / Family / Caregiver Call Type Triage / Clinical Relationship To Patient Self Return Phone Number [phone] (Primary) Chief Complaint Blood In Stool Reason for Call Symptomatic / Request for [place] states she has been having diarrhea and vomiting. States vomiting is clear but diarrhea has been black. Caller was transferred from the office. Translation No Nurse Assessment Nurse: Nicki Reaper, RN, Malachy Mood Date/Time (Eastern Time): 11/13/2021 1:58:16 PM Confirm and document reason for call. If symptomatic, describe symptoms. ---Caller states she has vomiting and diarrhea that started 4 days ago, has vomited 2 times today and vomit is clear in color, has had several bouts of diarrhea and stool is black in color, denies abdomen pain, fever and other symptoms, is drinking and voiding, Does the patient have any new or worsening symptoms? ---Yes Will a triage be completed? ---Yes Related visit to physician within the last 2 weeks? ---No Does the PT have any chronic conditions? (i.e. diabetes, asthma, this includes High risk factors for pregnancy, etc.) ---Yes List chronic conditions. ---COPD Chronic constipation Is this a behavioral health or substance abuse call? ---No Guidelines Guideline Title Affirmed Question Affirmed Notes Nurse Date/Time (Eastern Time) Diarrhea Black or tarry bowel movements (Exception: Chronicunchanged black-grey BMs AND is taking iron pills or PeptoBismol.) Nicki Reaper, RN, Malachy Mood  11/13/2021 2:01:47 PM PLEASE NOTE: All timestamps contained within this report are represented as [place] Standard Time. CONFIDENTIALTY NOTICE: This fax transmission is intended only for the addressee. It contains information that is legally privileged, confidential or otherwise protected from use or disclosure. If you are not the intended recipient, you are strictly prohibited from reviewing, disclosing, copying using or disseminating any of this information or taking any action in reliance on or regarding this information. If you have received this fax in error, please notify us immediately by telephone so that we can arrange for its return to [place]. Phone: [phone], Toll-Free: [phone], Fax: [phone] Page: 2 of 2 Call Id: [id number] [place]. Time Eilene Ghazi Time) Disposition Final User 11/13/2021 2:03:29 PM Go to ED Now Yes Nicki Reaper, RN, Erskine Speed Disagree/Comply Comply Caller Understands Yes PreDisposition Call Doctor Care Advice Given Per Guideline GO TO ED NOW: ANOTHER ADULT SHOULD DRIVE: BRING MEDICINES: * Bring a list of your current medicines when you go to the Emergency Department (ER). Comments User: Burna Sis, RN Date/Time Eilene Ghazi Time): 11/13/2021 2:04:07 PM Is not sure if she is going to ER or not, she is going to think about it Referrals GO TO FACILITY UNDECIDE

## 2021-11-13 NOTE — Telephone Encounter (Signed)
I spoke with pt; pt said has had watery diarrhea that is black and clear vomiting for 4 days. Pt said she is able to keep down water and gingerale now and pt has not had any diarrhea since spoke with access nurse earlier today around 2 PM. Pt said she does not have dry mouth and no abd pain. Pt said she does feel unsteady on her feet and weak. But Gracilyn said she has not been taking pepto bismol and has not seen any bright red blood. Pt said if she has any more watery diarrhea that is black she will go to ED. Pt said she will call on 11/14/21 with update. Sending note to Dr Darnell Level and Lattie Haw and will teams lisa.

## 2021-11-13 NOTE — Telephone Encounter (Signed)
Agree with recommendations.  If unable to keep food down or worsening unsteadiness, dizziness, or recurrent black stools, do recommend urgent evaluation as she may need further evaluation and IV fluids. Please call tomorrow for an update on symptoms.

## 2021-11-14 NOTE — Telephone Encounter (Signed)
Patient notified as instructed by telephone and verbalized understanding. Patient stated that she is feeling much better today. Patient stated that she has not had any black stools. Patient stated that she is has been able to tolerate crackers and is drinking lots of fluids. Patient stated that she has low back pain but that is not unusual for her. Patient denies any urinary symptoms. Patient stated if her symptoms return and she feels that she needs urgent evaluation she will go to the ED. Patient was advised to call the office back if she does not continue to improve and she stated that she will.  Patient denies a fever.

## 2021-11-21 ENCOUNTER — Telehealth: Payer: Self-pay

## 2021-11-21 NOTE — Telephone Encounter (Signed)
Patient called in stating that she has been having right sided abdominal pain for two weeks and is concerned as she has a history of cervical cancer. Did have patient speak with triage.

## 2021-11-22 NOTE — Telephone Encounter (Signed)
I spoke with pt; pt said for a couple of days pt  has had on and off sharp pain in lower rt side that only last few seconds. Pt said on and off has pressure feeling in lower abd on rt side. Pt said has hx of cervical CA but that was years ago. No vaginal discharge or itching. No N&V and no diarrhea or constipation. No fever and no UTI symptoms, no burning pain or frequency of urine.No other symptoms than the brief lower rt abd pain on and off. Per surgical hx pt has had appendectomy.Pt said her partner was admitted to hospital on 11/21/21 and cannot come for appt until she returns home and is OK. Pt scheduled appt with Dr Darnell Level on 11/28/21 at 10 AM. Sooner appts were offered but did not fit with pts schedule. UC & ED precautions given and pt voiced understanding. Sending note to Dr Darnell Level who is out of office today and Lattie Haw CMA.Marland Kitchen

## 2021-11-22 NOTE — Telephone Encounter (Signed)
[facility] - Client TELEPHONE ADVICE RECORD [facility] Patient Name: Christine Serrano OTRRN Gender: Female DOB: 1961/01/14 Age: 61 Y 10 M 18 D Return Phone Number: [phone] (Primary) Address: City/ State/ Zip: [place] [address] Client [facility] - Client Client Site [place] Provider Ria Bush - MD Contact Type Call Who Is Calling Patient / Member / Family / Caregiver Call Type Triage / Clinical Relationship To Patient Self Return Phone Number [phone] (Primary) Chief Complaint Abdominal Pain Reason for Call Symptomatic / Request for [place] states she has been having abdominal pain in her right side. Translation No Disp. Time Eilene Ghazi Time) Disposition Final User 11/21/2021 3:49:40 PM Attempt made - message left Arnaldo Natal 11/21/2021 3:58:50 PM Attempt made - no message left Arnaldo Natal 11/21/2021 4:05:37 PM FINAL ATTEMPT MADE - no message left Yes Hassell Done, RN, [facility]

## 2021-11-22 NOTE — Telephone Encounter (Signed)
Noted. Has been having GI issues for weeks, has declined sooner appointment. Previously provided ER precautions.

## 2021-11-23 ENCOUNTER — Telehealth: Payer: Self-pay

## 2021-11-23 DIAGNOSIS — M810 Age-related osteoporosis without current pathological fracture: Secondary | ICD-10-CM

## 2021-11-23 MED ORDER — DENOSUMAB 60 MG/ML ~~LOC~~ SOSY: 60.0000 mg | PREFILLED_SYRINGE | Freq: Once | SUBCUTANEOUS | 0 refills | Status: AC

## 2021-11-23 NOTE — Telephone Encounter (Signed)
Patient gets RX from [facility]. Refill sent in. Injection due 11/16/21 or after. Appointment for OV with Dr Darnell Level scheduled for 11/29/21-will need labs at that time-note made on appt desk

## 2021-11-27 ENCOUNTER — Telehealth: Payer: Self-pay | Admitting: *Deleted

## 2021-11-27 ENCOUNTER — Telehealth: Payer: Self-pay

## 2021-11-27 ENCOUNTER — Telehealth: Payer: Self-pay | Admitting: Family Medicine

## 2021-11-27 NOTE — Telephone Encounter (Signed)
430 pm.  Message received from patient.  Return call made and she is needing information on when the NP is coming again.  Gasper Sells, NP is scheduled to see her on 12/24/21.  Patient is asking for a sooner visit as she is needing assistance with bathing. Her partner who typically assists with ADL's is currently hospitalized and she does not have family that is able to assist.  Clarified services with Palliative Care and offered [facility] contact number who would be able to provide private duty aide assistance.

## 2021-11-27 NOTE — Addendum Note (Signed)
Addended by: Kris Mouton on: 11/27/2021 02:35 PM   Modules accepted: Orders

## 2021-11-27 NOTE — Telephone Encounter (Signed)
Prolia being delivered tomorrow for patient.

## 2021-11-27 NOTE — Telephone Encounter (Signed)
Noted! Thank you

## 2021-11-27 NOTE — Telephone Encounter (Signed)
PLEASE NOTE: All timestamps contained within this report are represented as [place] Standard Time. CONFIDENTIALTY NOTICE: This fax transmission is intended only for the addressee. It contains information that is legally privileged, confidential or otherwise protected from use or disclosure. If you are not the intended recipient, you are strictly prohibited from reviewing, disclosing, copying using or disseminating any of this information or taking any action in reliance on or regarding this information. If you have received this fax in error, please notify us immediately by telephone so that we can arrange for its return to [place]. Phone: [phone], Toll-Free: [phone], Fax: [phone] Page: 1 of 2 Call Id: [id number] [facility] - Client TELEPHONE ADVICE RECORD [facility] Patient Name: BRYNLI OLLIS HTDSK Gender: Female DOB: November 21, 1960 Age: 61 Y 10 M 24 D Return Phone Number: [phone] (Primary) Address: City/ State/ Zip: [place] [address] Client [facility] - Client Client Site [place] Provider Ria Bush - MD Contact Type Call Who Is Calling Patient / Member / Family / Caregiver Call Type Triage / Clinical Relationship To Patient Self Return Phone Number [phone] (Primary) Chief Complaint SEVERE ABDOMINAL PAIN - Severe pain in abdomen Reason for Call Symptomatic / Request for [place] stated she is having severe abdominal pain in the right side. Translation No Nurse Assessment Nurse: Leilani Merl, RN, Heather Date/Time (Eastern Time): 11/27/2021 2:00:11 PM Confirm and document reason for call. If symptomatic, describe symptoms. ---Caller states that she started with abd pain on the right side a couple of weeks ago, it only hurts when pressure is put on that area. Does the patient have any new or worsening symptoms? ---Yes Will a triage  be completed? ---Yes Related visit to physician within the last 2 weeks? ---No Does the PT have any chronic conditions? (i.e. diabetes, asthma, this includes High risk factors for pregnancy, etc.) ---Yes List chronic conditions. ---previous cervical CA Is this a behavioral health or substance abuse call? ---No Guidelines Guideline Title Affirmed Question Affirmed Notes Nurse Date/Time (Eastern Time) Abdominal Pain - Female [1] MODERATE pain (e.g., interferes with normal activities) AND [2] pain comes and goes (cramps) AND [3] present > 24 hours (Exception: Pain with Vomiting or Diarrhea - see that Guideline.) Standifer, RN, Nira Conn 11/27/2021 2:02:03 PM PLEASE NOTE: All timestamps contained within this report are represented as [place] Standard Time. CONFIDENTIALTY NOTICE: This fax transmission is intended only for the addressee. It contains information that is legally privileged, confidential or otherwise protected from use or disclosure. If you are not the intended recipient, you are strictly prohibited from reviewing, disclosing, copying using or disseminating any of this information or taking any action in reliance on or regarding this information. If you have received this fax in error, please notify us immediately by telephone so that we can arrange for its return to [place]. Phone: [phone], Toll-Free: [phone], Fax: [phone] Page: 2 of 2 Call Id: [id number] [place]. Time Eilene Ghazi Time) Disposition Final User 11/27/2021 1:58:22 PM Send to Urgent Deliah Boston 11/27/2021 2:04:46 PM See PCP within 24 Hours Yes Standifer, RN, Nira Conn Caller Disagree/Comply Comply Caller Understands Yes PreDisposition Call Doctor Care Advice Given Per Guideline SEE PCP WITHIN 24 HOURS: * IF OFFICE WILL BE OPEN: You need to be examined within the next 24 hours. Call your doctor (or NP/PA) when the office opens and make an appointment. CALL BACK IF: * You become worse * Severe pain  lasts over 1 hour *  Constant pain lasts over 2 hours CARE ADVICE given per Abdominal Pain - Female (Adult) guideline. Referrals REFERRED TO PCP OFFICE

## 2021-11-27 NOTE — Telephone Encounter (Signed)
Spoke with patient and asked her to call [facility] to discuss Prolia injection. Gave phone number to call. Will follow up with patient tomorrow during her OV in regards to this

## 2021-11-27 NOTE — Telephone Encounter (Signed)
Patient scheduled for an appointment with Dr. Danise Mina 11/28/21 at 10:30 am.

## 2021-11-28 ENCOUNTER — Ambulatory Visit (INDEPENDENT_AMBULATORY_CARE_PROVIDER_SITE_OTHER): Payer: Medicare Other | Admitting: Family Medicine

## 2021-11-28 ENCOUNTER — Encounter: Payer: Self-pay | Admitting: Family Medicine

## 2021-11-28 VITALS — BP 110/68 | HR 114 | Temp 97.4°F | Ht 65.0 in | Wt 90.1 lb

## 2021-11-28 DIAGNOSIS — M810 Age-related osteoporosis without current pathological fracture: Secondary | ICD-10-CM

## 2021-11-28 DIAGNOSIS — J449 Chronic obstructive pulmonary disease, unspecified: Secondary | ICD-10-CM

## 2021-11-28 DIAGNOSIS — R64 Cachexia: Secondary | ICD-10-CM | POA: Diagnosis not present

## 2021-11-28 DIAGNOSIS — Z9981 Dependence on supplemental oxygen: Secondary | ICD-10-CM | POA: Diagnosis not present

## 2021-11-28 DIAGNOSIS — R1031 Right lower quadrant pain: Secondary | ICD-10-CM | POA: Diagnosis not present

## 2021-11-28 DIAGNOSIS — E440 Moderate protein-calorie malnutrition: Secondary | ICD-10-CM

## 2021-11-28 DIAGNOSIS — J9611 Chronic respiratory failure with hypoxia: Secondary | ICD-10-CM

## 2021-11-28 DIAGNOSIS — R634 Abnormal weight loss: Secondary | ICD-10-CM

## 2021-11-28 LAB — COMPREHENSIVE METABOLIC PANEL
ALT: 10 U/L (ref 0–35)
AST: 18 U/L (ref 0–37)
Albumin: 3.1 g/dL — ABNORMAL LOW (ref 3.5–5.2)
Alkaline Phosphatase: 86 U/L (ref 39–117)
BUN: 5 mg/dL — ABNORMAL LOW (ref 6–23)
CO2: 36 mEq/L — ABNORMAL HIGH (ref 19–32)
Calcium: 9 mg/dL (ref 8.4–10.5)
Chloride: 96 mEq/L (ref 96–112)
Creatinine, Ser: 0.5 mg/dL (ref 0.40–1.20)
GFR: 100.18 mL/min (ref >60.00)
Glucose, Bld: 91 mg/dL (ref 70–99)
Potassium: 3.5 mEq/L (ref 3.5–5.1)
Sodium: 139 mEq/L (ref 135–145)
Total Bilirubin: 0.4 mg/dL (ref 0.2–1.2)
Total Protein: 5.6 g/dL — ABNORMAL LOW (ref 6.0–8.3)

## 2021-11-28 LAB — CBC WITH DIFFERENTIAL/PLATELET
Basophils Absolute: 0.1 10*3/uL (ref 0.0–0.1)
Basophils Relative: 1.1 % (ref 0.0–3.0)
Eosinophils Absolute: 0.1 10*3/uL (ref 0.0–0.7)
Eosinophils Relative: 1.3 % (ref 0.0–5.0)
HCT: 36.6 % (ref 36.0–46.0)
Hemoglobin: 12.1 g/dL (ref 12.0–15.0)
Lymphocytes Relative: 38.5 % (ref 12.0–46.0)
Lymphs Abs: 3.1 10*3/uL (ref 0.7–4.0)
MCHC: 33.2 g/dL (ref 30.0–36.0)
MCV: 87.9 fl (ref 78.0–100.0)
Monocytes Absolute: 0.6 10*3/uL (ref 0.1–1.0)
Monocytes Relative: 7.3 % (ref 3.0–12.0)
Neutro Abs: 4.2 10*3/uL (ref 1.4–7.7)
Neutrophils Relative %: 51.8 % (ref 43.0–77.0)
Platelets: 293 10*3/uL (ref 150.0–400.0)
RBC: 4.16 Mil/uL (ref 3.87–5.11)
RDW: 15.8 % — ABNORMAL HIGH (ref 11.5–15.5)
WBC: 8.1 10*3/uL (ref 4.0–10.5)

## 2021-11-28 NOTE — Progress Notes (Signed)
Patient ID: Christine Serrano, female    DOB: 1961-05-21, 61 y.o.   MRN: [id number]  This visit was conducted in person.  BP 110/68   Pulse (!) 114   Temp (!) 97.4 F (36.3 C) (Temporal)   Ht '5\' 5"'$  (1.651 m)   Wt 90 lb 2 oz (40.9 kg)   LMP  (LMP Unknown)   SpO2 95% Comment: 4 L  BMI 15.00 kg/m    CC: RLQ abd pain  Subjective:   HPI: Christine Serrano is a 61 y.o. female presenting on 11/28/2021 for Abdominal Pain (C/o RLQ abd pain.  Started about 1 wk ago.  Occurs when pressure applied to area. Also, c/o bowel incontinence. )   1 wk h/o RLQ abd pain associated with bowel changes (diarrhea, incontinence episodes). Had dark stool/diarrhea with vomiting 2 weeks ago that has since resolved. 7 lb weight loss in past 3 months. Currently notes intermittent lower abdominal pain to R inguinal region, only painful when pressure placed to this area. Denies recent heavy straining or inguinal bulge. No recent fevers/chills, nausea/vomiting, constipation, blood in stool. No UTI symptoms, dysuria, urgency or hematuria. Ongoing loose stools. She regularly coughs.   Endorses remote h/o cervical cancer s/p cold knife conization 1970s followed by laser ablation 1988.   H/o remote R fallopian tube rupture due to ectopic pregnancy 1990s.   Partner Angie not doing well, still in the hospital.   Known severe O2 dependent COPD, continued smoker, followed by pulm.  Due for Prolia injection - labs today.   Neighbor Sam drove her here today   Not on hospice - she would not have been able to use motorized scooter or portable oxygen concentrator.  Now [facility] involved - personal care aide.      Relevant past medical, surgical, family and social history reviewed and updated as indicated. Interim medical history since our last visit reviewed. Allergies and medications reviewed and updated. Outpatient Medications Prior to Visit  Medication Sig Dispense Refill   albuterol (PROVENTIL) (2.5 MG/3ML)  0.083% nebulizer solution Take 3 mLs (2.5 mg total) by nebulization every 6 (six) hours as needed for wheezing or shortness of breath. 150 mL 6   albuterol (VENTOLIN HFA) 108 (90 Base) MCG/ACT inhaler TAKE 2 PUFFS BY MOUTH EVERY 6 HOURS AS NEEDED FOR WHEEZE OR SHORTNESS OF BREATH 25.5 each 2   Ascorbic Acid (VITAMIN C WITH ROSE HIPS) 500 MG tablet Take 500 mg by mouth daily.     aspirin EC 81 MG tablet Take 81 mg by mouth daily.     benzonatate (TESSALON) 100 MG capsule TAKE 1 CAPSULE BY MOUTH THREE TIMES A DAY AS NEEDED FOR COUGH. NOT COVERED BY INSURANCE. 30 capsule 11   Budeson-Glycopyrrol-Formoterol (BREZTRI AEROSPHERE) 160-9-4.8 MCG/ACT AERO Inhale 2 puffs into the lungs in the morning and at bedtime. 32.1 g 3   buPROPion (WELLBUTRIN XL) 150 MG 24 hr tablet Take 1 tablet (150 mg total) by mouth daily. 90 tablet 1   cholecalciferol (VITAMIN D3) 25 MCG (1000 UNIT) tablet Take 1,000 Units by mouth daily.     fluticasone (FLONASE) 50 MCG/ACT nasal spray Place 2 sprays into both nostrils daily. 48 g 3   folic acid (FOLVITE) 1 MG tablet Take 1 tablet (1 mg total) by mouth daily. 90 tablet 1   gabapentin (NEURONTIN) 100 MG capsule Take 1 capsule (100 mg total) by mouth 2 (two) times daily. 180 capsule 1   hydrOXYzine (ATARAX/VISTARIL) 25 MG tablet  Take 1 tablet (25 mg total) by mouth 2 (two) times daily as needed for anxiety. 60 tablet 6   Iron, Ferrous Sulfate, 325 (65 Fe) MG TABS Take 325 mg by mouth daily. 30 tablet    LINZESS 290 MCG CAPS capsule Take 1 capsule (290 mcg total) by mouth daily as needed (constipation). TAKE 1 CAPSULE EVERY DAY AS NEEDED FOR CONSTIPATION AS DIRECTED 90 capsule 1   meclizine (ANTIVERT) 25 MG tablet Take 1 tablet (25 mg total) by mouth 3 (three) times daily as needed for dizziness. 30 tablet 0   methocarbamol (ROBAXIN) 500 MG tablet TAKE 1 TABLET BY MOUTH 3 TIMES DAILY AS NEEDED FOR MUSCLE SPASMS (SEDATION PRECAUTIONS). 40 tablet 3   methylPREDNISolone (MEDROL DOSEPAK)  4 MG TBPK tablet Take as directed in the package, this is a taper pack. 21 tablet 0   mirtazapine (REMERON) 15 MG tablet Take 1 tablet (15 mg total) by mouth at bedtime. 90 tablet 1   Multiple Vitamin (MULTIVITAMIN WITH MINERALS) TABS tablet Take 1 tablet by mouth daily.     naproxen (NAPROSYN) 375 MG tablet TAKE 1 TABLET BY MOUTH TWICE A DAY AS NEEDED FOR PAIN WITH FOOD 30 tablet 1   omeprazole (PRILOSEC) 40 MG capsule TAKE 1 CAPSULE BY MOUTH DAILY. FOR THREE WEEKS THEN AS NEEDED. TAKE 30 MIN PRIOR TO LARGE MEAL 90 capsule 1   OXYGEN Inhale 4 L/min into the lungs continuous.      polyethylene glycol powder (GLYCOLAX/MIRALAX) 17 GM/SCOOP powder Take 17 g by mouth daily as needed for moderate constipation. 3350 g 1   Potassium 99 MG TABS Take by mouth.     predniSONE (DELTASONE) 5 MG tablet Take 2 tablets (10 mg total) by mouth daily with breakfast. 60 tablet 2   sertraline (ZOLOFT) 100 MG tablet Take 1.5 tablets (150 mg total) by mouth daily. 135 tablet 1   thiamine 100 MG tablet Take 1 tablet (100 mg total) by mouth daily. 30 tablet 0   vitamin B-12 (CYANOCOBALAMIN) 1000 MCG tablet Take 1 tablet (1,000 mcg total) by mouth daily.     No facility-administered medications prior to visit.     Per HPI unless specifically indicated in ROS section below Review of Systems  Objective:  BP 110/68   Pulse (!) 114   Temp (!) 97.4 F (36.3 C) (Temporal)   Ht '5\' 5"'$  (1.651 m)   Wt 90 lb 2 oz (40.9 kg)   LMP  (LMP Unknown)   SpO2 95% Comment: 4 L  BMI 15.00 kg/m   Wt Readings from Last 3 Encounters:  11/28/21 90 lb 2 oz (40.9 kg)  08/31/21 97 lb (44 kg)  08/28/21 97 lb 12.8 oz (44.4 kg)      Physical Exam Vitals and nursing note reviewed.  Constitutional:      Appearance: Normal appearance. She is underweight. She is not ill-appearing.  HENT:     Mouth/Throat:     Mouth: Mucous membranes are moist.     Pharynx: Oropharynx is clear. No oropharyngeal exudate or posterior oropharyngeal  erythema.  Eyes:     Extraocular Movements: Extraocular movements intact.     Pupils: Pupils are equal, round, and reactive to light.  Cardiovascular:     Rate and Rhythm: Normal rate and regular rhythm.     Pulses: Normal pulses.     Heart sounds: Normal heart sounds. No murmur heard. Abdominal:     General: Bowel sounds are normal. There is no distension.  Palpations: Abdomen is soft. There is no mass.     Tenderness: There is abdominal tenderness (mild, reproducible with palpation) in the right lower quadrant. There is no right CVA tenderness, left CVA tenderness, guarding or rebound.     Hernia: No hernia is present.     Comments: No obvious hernia  Musculoskeletal:     Right lower leg: No edema.     Left lower leg: No edema.  Skin:    General: Skin is warm and dry.     Findings: No rash.  Neurological:     Mental Status: She is alert.  Psychiatric:        Mood and Affect: Mood normal.        Behavior: Behavior normal.       Results for orders placed or performed in visit on 11/28/21  Comprehensive metabolic panel  Result Value Ref Range   Sodium 139 135 - 145 mEq/L   Potassium 3.5 3.5 - 5.1 mEq/L   Chloride 96 96 - 112 mEq/L   CO2 36 (H) 19 - 32 mEq/L   Glucose, Bld 91 70 - 99 mg/dL   BUN 5 (L) 6 - 23 mg/dL   Creatinine, Ser 0.50 0.40 - 1.20 mg/dL   Total Bilirubin 0.4 0.2 - 1.2 mg/dL   Alkaline Phosphatase 86 39 - 117 U/L   AST 18 0 - 37 U/L   ALT 10 0 - 35 U/L   Total Protein 5.6 (L) 6.0 - 8.3 g/dL   Albumin 3.1 (L) 3.5 - 5.2 g/dL   GFR 100.18 >60.00 mL/min   Calcium 9.0 8.4 - 10.5 mg/dL  CBC with Differential/Platelet  Result Value Ref Range   WBC 8.1 4.0 - 10.5 K/uL   RBC 4.16 3.87 - 5.11 Mil/uL   Hemoglobin 12.1 12.0 - 15.0 g/dL   HCT 36.6 36.0 - 46.0 %   MCV 87.9 78.0 - 100.0 fl   MCHC 33.2 30.0 - 36.0 g/dL   RDW 15.8 (H) 11.5 - 15.5 %   Platelets 293.0 150.0 - 400.0 K/uL   Neutrophils Relative % 51.8 43.0 - 77.0 %   Lymphocytes Relative 38.5  12.0 - 46.0 %   Monocytes Relative 7.3 3.0 - 12.0 %   Eosinophils Relative 1.3 0.0 - 5.0 %   Basophils Relative 1.1 0.0 - 3.0 %   Neutro Abs 4.2 1.4 - 7.7 K/uL   Lymphs Abs 3.1 0.7 - 4.0 K/uL   Monocytes Absolute 0.6 0.1 - 1.0 K/uL   Eosinophils Absolute 0.1 0.0 - 0.7 K/uL   Basophils Absolute 0.1 0.0 - 0.1 K/uL    Assessment & Plan:   Problem List Items Addressed This Visit     COPD, very severe ([place])   Osteoporosis    Check prolia labs.       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  Will start evaluation with labs (CBC, CMP) and discussed possible further investigation with imaging given ongoing weight loss.       Relevant Orders   Comprehensive metabolic panel (Completed)   CBC with Differential/Platelet (Completed)   CT Abdomen Pelvis W Contrast   Unintentional weight loss    Marked weight loss of 15 lbs over the past 6 months - see below. Anticipate  due to pulmonary cachexia.       Relevant Orders   CT Abdomen Pelvis W Contrast   Protein-calorie malnutrition ([place])    As evidenced by low protein/albumin, cachexia, weight loss.         No orders of the defined types were placed in this encounter.  Orders Placed This Encounter  Procedures   CT Abdomen Pelvis W Contrast    Standing Status:   Future    Standing Expiration Date:   11/30/2022    Order Specific Question:   If indicated for the ordered procedure, I authorize the administration of contrast media per Radiology protocol    Answer:   Yes    Order Specific Question:   Preferred imaging location?    Answer:   [facility]    Order Specific Question:   Is Oral Contrast requested for this exam?    Answer:   Yes, Per Radiology protocol   Comprehensive metabolic  panel   CBC with Differential/Platelet     Patient Instructions  Labs today.  Depending on results we may get CT scan of abdomen/pelvis.   Follow up plan: Return if symptoms worsen or fail to improve.  Ria Bush, MD

## 2021-11-28 NOTE — Patient Instructions (Addendum)
Labs today.  Depending on results we may get CT scan of abdomen/pelvis.

## 2021-11-29 DIAGNOSIS — R1031 Right lower quadrant pain: Secondary | ICD-10-CM | POA: Insufficient documentation

## 2021-11-29 DIAGNOSIS — R634 Abnormal weight loss: Secondary | ICD-10-CM | POA: Insufficient documentation

## 2021-11-29 DIAGNOSIS — E46 Unspecified protein-calorie malnutrition: Secondary | ICD-10-CM | POA: Insufficient documentation

## 2021-11-29 NOTE — Assessment & Plan Note (Signed)
Actually points to groin area but without signs of hernia, story/exam not consistent with appendicitis or diverticulitis either.  Will start evaluation with labs (CBC, CMP) and discussed possible further investigation with imaging given ongoing weight loss.

## 2021-11-29 NOTE — Assessment & Plan Note (Signed)
As evidenced by low protein/albumin, cachexia, weight loss.

## 2021-11-29 NOTE — Assessment & Plan Note (Signed)
Marked weight loss of 15 lbs over the past 6 months - see below. Anticipate due to pulmonary cachexia.

## 2021-11-29 NOTE — Assessment & Plan Note (Signed)
Check prolia labs.

## 2021-11-30 ENCOUNTER — Telehealth: Payer: Self-pay

## 2021-11-30 NOTE — Telephone Encounter (Signed)
Returned patient's call and results were given. See result notes.

## 2021-11-30 NOTE — Telephone Encounter (Signed)
Patient returned phone call from [place].

## 2021-12-02 ENCOUNTER — Other Ambulatory Visit: Payer: Self-pay | Admitting: Family Medicine

## 2021-12-02 DIAGNOSIS — J9611 Chronic respiratory failure with hypoxia: Secondary | ICD-10-CM | POA: Diagnosis not present

## 2021-12-02 DIAGNOSIS — Z7409 Other reduced mobility: Secondary | ICD-10-CM | POA: Diagnosis not present

## 2021-12-02 DIAGNOSIS — J449 Chronic obstructive pulmonary disease, unspecified: Secondary | ICD-10-CM | POA: Diagnosis not present

## 2021-12-03 NOTE — Telephone Encounter (Signed)
Naproxen Last filled:  05/07/21, #30 Last OV:  11/28/21, RLQ abd pain Next OV:  none

## 2021-12-07 ENCOUNTER — Other Ambulatory Visit: Payer: Self-pay | Admitting: Family Medicine

## 2021-12-07 DIAGNOSIS — F419 Anxiety disorder, unspecified: Secondary | ICD-10-CM

## 2021-12-07 DIAGNOSIS — F331 Major depressive disorder, recurrent, moderate: Secondary | ICD-10-CM

## 2021-12-12 MED ORDER — OMEPRAZOLE 40 MG PO CPDR: 40.0000 mg | DELAYED_RELEASE_CAPSULE | Freq: Every day | ORAL | 1 refills | Status: DC | PRN

## 2021-12-12 MED ORDER — GABAPENTIN 100 MG PO CAPS: 100.0000 mg | ORAL_CAPSULE | Freq: Two times a day (BID) | ORAL | 1 refills | Status: DC

## 2021-12-12 MED ORDER — FOLIC ACID 1 MG PO TABS: 1.0000 mg | ORAL_TABLET | Freq: Every day | ORAL | 1 refills | Status: DC

## 2021-12-12 MED ORDER — BENZONATATE 100 MG PO CAPS: ORAL_CAPSULE | ORAL | 3 refills | Status: DC

## 2021-12-12 MED ORDER — HYDROXYZINE HCL 25 MG PO TABS: 25.0000 mg | ORAL_TABLET | Freq: Two times a day (BID) | ORAL | 6 refills | Status: DC | PRN

## 2021-12-12 MED ORDER — BUPROPION HCL ER (XL) 150 MG PO TB24: 150.0000 mg | ORAL_TABLET | Freq: Every day | ORAL | 1 refills | Status: DC

## 2021-12-12 MED ORDER — MIRTAZAPINE 15 MG PO TABS: 15.0000 mg | ORAL_TABLET | Freq: Every day | ORAL | 1 refills | Status: DC

## 2021-12-12 MED ORDER — SERTRALINE HCL 100 MG PO TABS: 150.0000 mg | ORAL_TABLET | Freq: Every day | ORAL | 1 refills | Status: DC

## 2021-12-12 NOTE — Telephone Encounter (Signed)
ERx 

## 2021-12-24 ENCOUNTER — Emergency Department (HOSPITAL_COMMUNITY): Payer: Medicare Other

## 2021-12-24 ENCOUNTER — Emergency Department (HOSPITAL_COMMUNITY)
Admission: EM | Admit: 2021-12-24 | Discharge: 2021-12-25 | Discharge status: Against Medical Advice | Payer: Medicare Other | Attending: Emergency Medicine | Admitting: Emergency Medicine

## 2021-12-24 ENCOUNTER — Other Ambulatory Visit: Payer: Medicare Other | Admitting: Student

## 2021-12-24 DIAGNOSIS — R404 Transient alteration of awareness: Secondary | ICD-10-CM | POA: Diagnosis not present

## 2021-12-24 DIAGNOSIS — R634 Abnormal weight loss: Secondary | ICD-10-CM

## 2021-12-24 DIAGNOSIS — F32A Depression, unspecified: Secondary | ICD-10-CM

## 2021-12-24 DIAGNOSIS — R000 Tachycardia, unspecified: Secondary | ICD-10-CM | POA: Insufficient documentation

## 2021-12-24 DIAGNOSIS — F17210 Nicotine dependence, cigarettes, uncomplicated: Secondary | ICD-10-CM | POA: Diagnosis not present

## 2021-12-24 DIAGNOSIS — Z515 Encounter for palliative care: Secondary | ICD-10-CM | POA: Diagnosis not present

## 2021-12-24 DIAGNOSIS — Z743 Need for continuous supervision: Secondary | ICD-10-CM | POA: Diagnosis not present

## 2021-12-24 DIAGNOSIS — R079 Chest pain, unspecified: Secondary | ICD-10-CM

## 2021-12-24 DIAGNOSIS — I10 Essential (primary) hypertension: Secondary | ICD-10-CM | POA: Diagnosis not present

## 2021-12-24 DIAGNOSIS — J449 Chronic obstructive pulmonary disease, unspecified: Secondary | ICD-10-CM

## 2021-12-24 DIAGNOSIS — Z7951 Long term (current) use of inhaled steroids: Secondary | ICD-10-CM | POA: Insufficient documentation

## 2021-12-24 DIAGNOSIS — Z5329 Procedure and treatment not carried out because of patient's decision for other reasons: Secondary | ICD-10-CM | POA: Insufficient documentation

## 2021-12-24 DIAGNOSIS — Z7952 Long term (current) use of systemic steroids: Secondary | ICD-10-CM | POA: Insufficient documentation

## 2021-12-24 DIAGNOSIS — R0789 Other chest pain: Secondary | ICD-10-CM | POA: Diagnosis present

## 2021-12-24 DIAGNOSIS — I499 Cardiac arrhythmia, unspecified: Secondary | ICD-10-CM | POA: Diagnosis not present

## 2021-12-24 DIAGNOSIS — Z7982 Long term (current) use of aspirin: Secondary | ICD-10-CM | POA: Insufficient documentation

## 2021-12-24 DIAGNOSIS — R0602 Shortness of breath: Secondary | ICD-10-CM | POA: Diagnosis not present

## 2021-12-24 DIAGNOSIS — R6889 Other general symptoms and signs: Secondary | ICD-10-CM | POA: Diagnosis not present

## 2021-12-24 DIAGNOSIS — J441 Chronic obstructive pulmonary disease with (acute) exacerbation: Secondary | ICD-10-CM | POA: Diagnosis not present

## 2021-12-24 DIAGNOSIS — I700 Atherosclerosis of aorta: Secondary | ICD-10-CM | POA: Diagnosis not present

## 2021-12-24 DIAGNOSIS — J439 Emphysema, unspecified: Secondary | ICD-10-CM | POA: Diagnosis not present

## 2021-12-24 LAB — CBC WITH DIFFERENTIAL/PLATELET
Abs Immature Granulocytes: 0.02 10*3/uL (ref 0.00–0.07)
Basophils Absolute: 0 10*3/uL (ref 0.0–0.1)
Basophils Relative: 0 %
Eosinophils Absolute: 0 10*3/uL (ref 0.0–0.5)
Eosinophils Relative: 0 %
HCT: 36.3 % (ref 36.0–46.0)
Hemoglobin: 11.8 g/dL — ABNORMAL LOW (ref 12.0–15.0)
Immature Granulocytes: 0 %
Lymphocytes Relative: 19 %
Lymphs Abs: 1.1 10*3/uL (ref 0.7–4.0)
MCH: 29.4 pg (ref 26.0–34.0)
MCHC: 32.5 g/dL (ref 30.0–36.0)
MCV: 90.5 fL (ref 80.0–100.0)
Monocytes Absolute: 0.2 10*3/uL (ref 0.1–1.0)
Monocytes Relative: 3 %
Neutro Abs: 4.2 10*3/uL (ref 1.7–7.7)
Neutrophils Relative %: 78 %
Platelets: 197 10*3/uL (ref 150–400)
RBC: 4.01 MIL/uL (ref 3.87–5.11)
RDW: 13.7 % (ref 11.5–15.5)
WBC: 5.5 10*3/uL (ref 4.0–10.5)
nRBC: 0 % (ref 0.0–0.2)

## 2021-12-24 LAB — COMPREHENSIVE METABOLIC PANEL
ALT: 13 U/L (ref 0–44)
AST: 26 U/L (ref 15–41)
Albumin: 2.6 g/dL — ABNORMAL LOW (ref 3.5–5.0)
Alkaline Phosphatase: 89 U/L (ref 38–126)
Anion gap: 13 (ref 5–15)
BUN: 6 mg/dL — ABNORMAL LOW (ref 8–23)
CO2: 29 mmol/L (ref 22–32)
Calcium: 8.1 mg/dL — ABNORMAL LOW (ref 8.9–10.3)
Chloride: 96 mmol/L — ABNORMAL LOW (ref 98–111)
Creatinine, Ser: 0.54 mg/dL (ref 0.44–1.00)
GFR, Estimated: >60 mL/min (ref >60)
Glucose, Bld: 101 mg/dL — ABNORMAL HIGH (ref 70–99)
Potassium: 3.6 mmol/L (ref 3.5–5.1)
Sodium: 138 mmol/L (ref 135–145)
Total Bilirubin: 0.3 mg/dL (ref 0.3–1.2)
Total Protein: 4.5 g/dL — ABNORMAL LOW (ref 6.5–8.1)

## 2021-12-24 LAB — BRAIN NATRIURETIC PEPTIDE: B Natriuretic Peptide: 159.1 pg/mL — ABNORMAL HIGH (ref 0.0–100.0)

## 2021-12-24 LAB — TROPONIN I (HIGH SENSITIVITY): Troponin I (High Sensitivity): 13 ng/L (ref <18)

## 2021-12-24 MED ORDER — ALBUTEROL SULFATE (2.5 MG/3ML) 0.083% IN NEBU
5.0000 mg | INHALATION_SOLUTION | Freq: Once | RESPIRATORY_TRACT | Status: AC
Start: 2021-12-24 — End: 2021-12-24
  Administered 2021-12-24: 5 mg via RESPIRATORY_TRACT
  Filled 2021-12-24: qty 6

## 2021-12-24 MED ORDER — METHYLPREDNISOLONE SODIUM SUCC 125 MG IJ SOLR
125.0000 mg | Freq: Once | INTRAMUSCULAR | Status: AC
Start: 2021-12-24 — End: 2021-12-24
  Administered 2021-12-24: 125 mg via INTRAVENOUS
  Filled 2021-12-24: qty 2

## 2021-12-24 NOTE — ED Notes (Addendum)
Patient approached nurses station in wheelchair pushed by her husband. Patient and husband were verbally aggressive towards RN. RN attempted to get patient to stay for continued evaluation, patient declined and left without further assessment or evaluation. ED Provider made aware.

## 2021-12-24 NOTE — ED Triage Notes (Addendum)
Patient BIB [facility] from home after calling for CP starting this morning. EMS arrived and found pt w/ HR 155, dizzy, and BP 90/60. Patient A&Ox4, 101/64 after 579m NS. HR down to 112 after '6mg'$  adenosine.

## 2021-12-24 NOTE — Progress Notes (Signed)
[facility] Consult Note Telephone: [phone]  Fax: [phone]    Date of encounter: 12/24/21 3:06 PM PATIENT NAME: Christine Serrano [address] [place] [address]   [phone] (home)  DOB: [birth year] MRN: [id number] PRIMARY CARE PROVIDER:    Ria Bush, MD,  [place] [address] [phone]  REFERRING PROVIDER:   Ria Bush, MD [address] [place],  [place] [address] [phone]  RESPONSIBLE PARTY:    Contact Information     Name Relation Home Work Mobile   [place]   [phone]        I met face to face with patient and roommate in the home. Palliative Care was asked to follow this patient by consultation request of  Ria Bush, MD to address advance care planning and complex medical decision making. This is a follow up visit.                                   ASSESSMENT AND PLAN / RECOMMENDATIONS:   Advance Care Planning/Goals of Care: Goals include to maximize quality of life and symptom management. Patient/health care surrogate gave his/her permission to discuss. Our advance care planning conversation included a discussion about:    The value and importance of advance care planning  Experiences with loved ones who have been seriously ill or have died  Exploration of personal, cultural or spiritual beliefs that might influence medical decisions  Exploration of goals of care in the event of a sudden injury or illness   CODE STATUS: DNR  Education provided on palliative medicine vs. Hospice. Patient with recent weight loss; we discussed going back on hospice as she had revoked services due to not feeling she was ready for hospice yet. She would like to see if she can start to gain weight again. Will monitor closely and plan to see back in 4 weeks. She has end stage COPD and denies any worsening of breathing. Upon assessment, patient does  complain of chest pain for past several days and also with tachycardia, HF of 160. Patient agrees to go to ED for further evaluation.  Symptom Management/Plan:  Chest pain, tachycardia-patient being sent to ED for further evaluation. Patient administered 4 $RemoveBefore'81mg'[id number]$  aspirin per fire dept/first responders as she had none in the home. Apical pulse was 160; unable to get accurate manual blood pressure after multiple attempts. Patient left via EMS.  COPD-denies any worsening of her breathing; no recent infections since last palliative visit. Continue Breztri inhaler BID, albuterol nebulizer and prednisone as directed. She continues to wear oxygen at 4 lpm. Education provided on smoking cessation.   Depression and anxiety-she reports worsening anxiety over roommate health concerns. Continue sertraline, Wellbutrin and mirtazapine as directed. We discussed increasing her mirtazapine, but she wants to hold off increasing for now. Will increase hydroxyzine to TID PRN.   Abnormal Weight loss-patient with 12 pound loss since May; this is > 10 % loss. We discussed going back on hospice services; she still feels she is not quite ready and would like to try increasing her diet, nutritional supplements. Also encourage snacks, small frequent meals throughout the day.   Follow up Palliative Care Visit: Palliative care will continue to follow for complex medical decision making, advance care planning, and clarification of goals. Return in 4 weeks or prn.   This visit was coded based on  medical decision making (MDM).  PPS: 40%  HOSPICE ELIGIBILITY/DIAGNOSIS: TBD  Chief Complaint: Palliative Medicine follow up visit.   HISTORY OF PRESENT ILLNESS:  Christine Serrano is a 61 y.o. 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   Patient endorses chest pain for past several days, does radiate to left  shoulder. Pain is a 8/10. Feels like her breathing has been stable; no recent infections. Wearing oxygen at 4 lpm. She last had a breathing treatment 3-4 hours ago. She does endorse worsening anxiety due to roommate has been in hospital with health issues. She states she has not been eating as well. She now has a caregiver coming in a couple days a week to assist with adl's.   History obtained from review of EMR, discussion with primary team, and interview with family, facility staff/caregiver and/or Christine Serrano.  I reviewed available labs, medications, imaging, studies and related documents from the EMR.  Records reviewed and summarized above.   Physical Exam:  Pulse 160, resp 24 , sats 97% on 4 lpm Constitutional: NAD General: frail appearing, thin, ill appearing EYES: anicteric sclera, lids intact, no discharge  ENMT: intact hearing, oral mucous membranes moist, dentition intact CV: S1S2, tachycardia, no LE edema Pulmonary: Lungs diminished, R > L, breathing slightly labored Abdomen: normo-active BS + 4 quadrants, soft and non tender GU: deferred MSK: + sarcopenia, moves all extremities, ambulatory Skin: warm and dry Neuro:generalized weakness,  no cognitive impairment Psych: anxious affect, A and O x 3 Hem/lymph/immuno: no widespread bruising   Thank you for the opportunity to participate in the care of Christine Serrano.  The palliative care team will continue to follow. Please call our office at [phone] if we can be of additional assistance.   Ezekiel Slocumb, NP   COVID-19 PATIENT SCREENING TOOL Asked and negative response unless otherwise noted:   Have you had symptoms of covid, tested positive or been in contact with someone with symptoms/positive test in the past 5-10 days? No

## 2021-12-24 NOTE — ED Provider Notes (Signed)
[facility] EMERGENCY DEPARTMENT Provider Note   CSN: [id number] Arrival date & time: 12/24/21  1653     History  Chief Complaint  Patient presents with   Tachycardia    Christine Serrano is a 61 y.o. female.  HPI Patient with a history of COPD, involved in palliative care given the long history of this, now presents with chest pain.  She has dyspnea at baseline, this is essentially unchanged.  She wears oxygen 24/7.  With past day she has developed chest tightness, without fever, without syncope.  No nausea, no vomiting.  No change in medication. Patient continues to smoke cigarettes.    Home Medications Prior to Admission medications   Medication Sig Start Date End Date Taking? Authorizing Provider  albuterol (PROVENTIL) (2.5 MG/3ML) 0.083% nebulizer solution Take 3 mLs (2.5 mg total) by nebulization every 6 (six) hours as needed for wheezing or shortness of breath. 05/07/21   Ria Bush, MD  albuterol (VENTOLIN HFA) 108 (90 Base) MCG/ACT inhaler TAKE 2 PUFFS BY MOUTH EVERY 6 HOURS AS NEEDED FOR WHEEZE OR SHORTNESS OF BREATH 06/18/21   Ria Bush, MD  Ascorbic Acid (VITAMIN C WITH ROSE HIPS) 500 MG tablet Take 500 mg by mouth daily.    [provider]  aspirin EC 81 MG tablet Take 81 mg by mouth daily.    [provider]  benzonatate (TESSALON) 100 MG capsule TAKE 1 CAPSULE BY MOUTH THREE TIMES A DAY AS NEEDED FOR COUGH. 12/12/21   Ria Bush, MD  Budeson-Glycopyrrol-Formoterol (BREZTRI AEROSPHERE) 160-9-4.8 MCG/ACT AERO Inhale 2 puffs into the lungs in the morning and at bedtime. 05/30/21   Tyler Pita, MD  buPROPion (WELLBUTRIN XL) 150 MG 24 hr tablet Take 1 tablet (150 mg total) by mouth daily. 12/12/21   Ria Bush, MD  cholecalciferol (VITAMIN D3) 25 MCG (1000 UNIT) tablet Take 1,000 Units by mouth daily.    [provider]  fluticasone (FLONASE) 50 MCG/ACT nasal spray Place 2 sprays into both nostrils  daily. 06/06/20   Ria Bush, MD  folic acid (FOLVITE) 1 MG tablet Take 1 tablet (1 mg total) by mouth daily. 12/12/21   Ria Bush, MD  gabapentin (NEURONTIN) 100 MG capsule Take 1 capsule (100 mg total) by mouth 2 (two) times daily. 12/12/21   Ria Bush, MD  hydrOXYzine (ATARAX) 25 MG tablet Take 1 tablet (25 mg total) by mouth 2 (two) times daily as needed for anxiety. 12/12/21   Ria Bush, MD  Iron, Ferrous Sulfate, 325 (65 Fe) MG TABS Take 325 mg by mouth daily. 06/29/21   Ria Bush, MD  LINZESS 290 MCG CAPS capsule Take 1 capsule (290 mcg total) by mouth daily as needed (constipation). TAKE 1 CAPSULE EVERY DAY AS NEEDED FOR CONSTIPATION AS DIRECTED 05/07/21   Ria Bush, MD  meclizine (ANTIVERT) 25 MG tablet Take 1 tablet (25 mg total) by mouth 3 (three) times daily as needed for dizziness. 05/07/21   Ria Bush, MD  methocarbamol (ROBAXIN) 500 MG tablet TAKE 1 TABLET BY MOUTH 3 TIMES DAILY AS NEEDED FOR MUSCLE SPASMS (SEDATION PRECAUTIONS). 05/07/21   Ria Bush, MD  methylPREDNISolone (MEDROL DOSEPAK) 4 MG TBPK tablet Take as directed in the package, this is a taper pack. 08/28/21   Tyler Pita, MD  mirtazapine (REMERON) 15 MG tablet Take 1 tablet (15 mg total) by mouth at bedtime. 12/12/21   Ria Bush, MD  Multiple Vitamin (MULTIVITAMIN WITH MINERALS) TABS tablet Take 1 tablet by mouth  daily. 09/12/19   Aline August, MD  naproxen (NAPROSYN) 375 MG tablet TAKE 1 TABLET BY MOUTH TWICE  DAILY AS NEEDED FOR PAIN WITH  FOOD 12/05/21   Ria Bush, MD  omeprazole (PRILOSEC) 40 MG capsule Take 1 capsule (40 mg total) by mouth daily as needed (GERD). 12/12/21   Ria Bush, MD  OXYGEN Inhale 4 L/min into the lungs continuous.     [provider]  polyethylene glycol powder (GLYCOLAX/MIRALAX) 17 GM/SCOOP powder Take 17 g by mouth daily as needed for moderate constipation. 03/13/20   Ria Bush, MD  Potassium  99 MG TABS Take by mouth.    [provider]  predniSONE (DELTASONE) 5 MG tablet Take 2 tablets (10 mg total) by mouth daily with breakfast. 08/27/21   Tyler Pita, MD  sertraline (ZOLOFT) 100 MG tablet Take 1.5 tablets (150 mg total) by mouth daily. 12/12/21   Ria Bush, MD  thiamine 100 MG tablet Take 1 tablet (100 mg total) by mouth daily. 09/12/19   Aline August, MD  vitamin B-12 (CYANOCOBALAMIN) 1000 MCG tablet Take 1 tablet (1,000 mcg total) by mouth daily. 06/06/20   Ria Bush, MD      Allergies    Terbinafine and related, Clarithromycin, and Imitrex [sumatriptan]    Review of Systems   Review of Systems  All other systems reviewed and are negative.   Physical Exam Updated Vital Signs BP (!) 157/132   Pulse (!) 121   Temp (!) 97.4 F (36.3 C) (Oral)   Resp (!) 25   LMP  (LMP Unknown)   SpO2 (!) 87%  Physical Exam Vitals and nursing note reviewed.  Constitutional:      Appearance: She is well-developed. She is ill-appearing.  HENT:     Head: Normocephalic and atraumatic.  Eyes:     Conjunctiva/sclera: Conjunctivae normal.  Cardiovascular:     Rate and Rhythm: Regular rhythm. Tachycardia present.  Pulmonary:     Effort: Pulmonary effort is normal. Tachypnea present.     Breath sounds: Decreased breath sounds present.  Abdominal:     General: There is no distension.  Skin:    General: Skin is warm and dry.  Neurological:     Mental Status: She is alert and oriented to person, place, and time.     Cranial Nerves: No cranial nerve deficit.  Psychiatric:        Mood and Affect: Mood normal.     ED Results / Procedures / Treatments   Labs (all labs ordered are listed, but only abnormal results are displayed) Labs Reviewed  COMPREHENSIVE METABOLIC PANEL - Abnormal; Notable for the following components:      Result Value   Chloride 96 (*)    Glucose, Bld 101 (*)    BUN 6 (*)    Calcium 8.1 (*)    Total Protein 4.5 (*)    Albumin  2.6 (*)    All other components within normal limits  CBC WITH DIFFERENTIAL/PLATELET - Abnormal; Notable for the following components:   Hemoglobin 11.8 (*)    All other components within normal limits  BRAIN NATRIURETIC PEPTIDE - Abnormal; Notable for the following components:   B Natriuretic Peptide 159.1 (*)    All other components within normal limits  RESP PANEL BY RT-PCR (FLU A&B, COVID) [id number]  TROPONIN I (HIGH SENSITIVITY)  TROPONIN I (HIGH SENSITIVITY)    EKG EKG Interpretation  Date/Time:  Monday December 24 2021 16:59:34 EDT Ventricular Rate:  121 PR Interval:  156 QRS Duration: 103 QT Interval:  339 QTC Calculation: 481 R Axis:   95 Text Interpretation: Sinus tachycardia Consider right atrial enlargement Probable RVH w/ secondary repol abnormality Nonspecific T abnormalities, lateral leads Abnormal ECG Confirmed by Carmin Muskrat [phone]) on 12/24/2021 5:01:05 PM  Radiology DG Chest Port 1 View  Result Date: 12/24/2021 CLINICAL DATA:  Shortness of breath EXAM: PORTABLE CHEST 1 VIEW COMPARISON:  05/26/2021 FINDINGS: Apical lordotic projection. Severe emphysema. Stable peripheral left upper lobe nodularity, not hypermetabolic on PET-CT of 43/32/9518. Accounting for the apical lordotic projection, there is stable right upper lobe suprahilar scarring. Atherosclerotic calcification of the aortic arch. Prominent main pulmonary artery probably from pulmonary arterial hypertension. No blunting of the costophrenic angles. Heart size within normal limits. IMPRESSION: 1. Severe emphysema. Aortic Atherosclerosis (ICD10-I70.0) and Emphysema (ICD10-J43.9). 2. Chronic biapical scarring. 3. Prominent main pulmonary artery favoring pulmonary arterial hypertension. 4. No acute findings. Electronically Signed   By: Van Clines M.D.   On: 12/24/2021 18:15    Procedures Procedures    Medications Ordered in ED Medications  methylPREDNISolone sodium succinate (SOLU-MEDROL) 125 mg/2 mL  injection 125 mg (125 mg Intravenous Given 12/24/21 1812)  albuterol (PROVENTIL) (2.5 MG/3ML) 0.083% nebulizer solution 5 mg (5 mg Nebulization Given 12/24/21 1811)    ED Course/ Medical Decision Making/ A&P This patient with a Hx of COPD who continues to smoke presents to the ED for concern of dyspnea, this involves an extensive number of treatment options, and is a complaint that carries with it a high risk of complications and morbidity.    The differential diagnosis includes COPD exacerbation, pneumonia, bacteremia, sepsis   Social Determinants of Health:  Cigarette addiction, home oxygen dependency  Additional history obtained:  Additional history and/or information obtained from chart, notable for palliative care notes going back the past 2 months with involvement with the patient, who has apparently declined hospice involvement, but continues to speak with palliative care   After the initial evaluation, orders, including: X-ray labs bronchodilator were initiated.   Patient placed on Cardiac and Pulse-Oximetry Monitors. The patient was maintained on a cardiac monitor.  The cardiac monitored showed an rhythm of 121 sinus tach abnormal The patient was also maintained on pulse oximetry. The readings were typically 95% with supplemental oxygen abnormal   On repeat evaluation of the patient stayed the same  Lab Tests:  I personally interpreted labs.  The pertinent results include: Unremarkable labs  Imaging Studies ordered:  I independently visualized and interpreted imaging which showed no pneumonia, read characterization of severe emphysema I agree with the radiologist interpretation   Dispostion / Final MDM:  After consideration of the diagnostic results and the patient's response to treatment, patient was likely appropriate for discharge home, but x-ray left prior to my repeat evaluation and discussion with the patient. Adult female with COPD, home oxygen dependency, who  continues to smoke cigarettes presents with dyspnea.  Here no evidence for pneumonia, bacteremia, sepsis, some suspicion for progression of her COPD.  No early evidence for contemporaneous pneumonia, bacteremia, sepsis, ACS.  Final Clinical Impression(s) / ED Diagnoses Final diagnoses:  COPD exacerbation ([place])     Carmin Muskrat, MD 12/24/21 [phone]

## 2021-12-26 ENCOUNTER — Ambulatory Visit: Payer: Medicare Other | Admitting: Pulmonary Disease

## 2021-12-27 ENCOUNTER — Telehealth: Payer: Self-pay

## 2021-12-27 NOTE — Telephone Encounter (Signed)
Per appt notes pt has appt with Dr Darnell Level on 12/28/21 and access note said pt may go to ED later today. I cannot see where CT abd ordered on 11/29/21 has been scheduled. Sending note to Dr Darnell Level who is out of office, Dr Damita Dunnings who is in office and Lattie Haw CMA.

## 2021-12-27 NOTE — Telephone Encounter (Signed)
If severe pain, then rec ER.  Please check with referrals to see about scheduling of CT.  Routed to PCP as FYI in the meantime.

## 2021-12-27 NOTE — Telephone Encounter (Signed)
[facility] [facility] - Client TELEPHONE ADVICE RECORD [facility] Patient Name: Christine Serrano TFTDD Gender: Female DOB: Dec 19, 1960 Age: 61 Y 11 M 23 D Return Phone Number: [phone] (Primary) Address: City/ State/ Zip: [place] [address] Client [place] [facility] - Client Client Site [place] Provider Ria Bush - MD Contact Type Call Who Is Calling Patient / Member / Family / Caregiver Call Type Triage / Clinical Caller Name Aniceto Boss Relationship To Patient Provider Return Phone Number [phone] (Primary) Chief Complaint SEVERE ABDOMINAL PAIN - Severe pain in abdomen Reason for Call Symptomatic / Request for [place] states trying to get a pt triage for right side ab pain and where the pain is her skin hard, Translation No Nurse Assessment Nurse: Rolin Barry, RN, Levada Dy Date/Time (Eastern Time): 12/27/2021 12:00:36 PM Confirm and document reason for call. If symptomatic, describe symptoms. ---Caller states trying to get a pt triage for right side ab pain and where the pain is her skin hard. Caller states that she has been seen before. Has been having pain x 6 weeks. States that the abdomen is hard , feels like a mass. No temp. Does the patient have any new or worsening symptoms? ---Yes Will a triage be completed? ---Yes Related visit to physician within the last 2 weeks? ---Yes Does the PT have any chronic conditions? (i.e. diabetes, asthma, this includes High risk factors for pregnancy, etc.) ---Yes List chronic conditions. 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 ---No Guidelines Guideline Title Affirmed Question Affirmed Notes Nurse Date/Time (Eastern Time) Abdominal Pain - Female Black or tarry bowel movements (Exception: Chronicunchanged black-grey BMs AND is  taking Rolin Barry, RN, Levada Dy 12/27/2021 12:03:19 PM PLEASE NOTE: All timestamps contained within this report are represented as [place] Standard Time. CONFIDENTIALTY NOTICE: This fax transmission is intended only for the addressee. It contains information that is legally privileged, confidential or otherwise protected from use or disclosure. If you are not the intended recipient, you are strictly prohibited from reviewing, disclosing, copying using or disseminating any of this information or taking any action in reliance on or regarding this information. If you have received this fax in error, please notify us immediately by telephone so that we can arrange for its return to [place]. Phone: [phone], Toll-Free: [phone], Fax: [phone] Page: 2 of 2 Call Id: [id number] Guidelines Guideline Title Affirmed Question Affirmed Notes Nurse Date/Time Eilene Ghazi Time) iron pills or PeptoBismol.) Disp. Time Eilene Ghazi Time) Disposition Final User 12/27/2021 11:58:49 AM Send to Urgent Queue Reece Leader 12/27/2021 12:09:48 PM Go to ED Now Yes Rolin Barry, RN, Levada Dy Final Disposition 12/27/2021 12:09:48 PM Go to ED Now Yes Deaton, RN, Cindee Lame Disagree/Comply Disagree Caller Understands Yes PreDisposition Did not know what to do Care Advice Given Per Guideline GO TO ED NOW: * You need to be seen in the Emergency Department. * Go to the ED at ___________ [place] now. Drive carefully. NOTE TO TRIAGER - DRIVING: * Another adult should drive. CARE ADVICE given per Abdominal Pain - Female (Adult) guideline. Comments User: Saverio Danker, RN Date/Time Eilene Ghazi Time): 12/27/2021 12:13:27 PM Caller states that she can not go t othe ED now, she may be able to go later, advised that she has been having these sx for some time. Caller wants to know if she is scheduled for tomorrow. User: Saverio Danker, RN Date/Time Eilene Ghazi Time): 12/27/2021  12:18:53 PM Spoke with office she has appt tomorrow, no appts  until her appt. Caller made aware, advised that she will most likely go to the ED this afternoon. Referrals GO TO FACILITY REFUSE

## 2021-12-28 ENCOUNTER — Ambulatory Visit: Payer: Medicare Other | Admitting: Family Medicine

## 2021-12-28 NOTE — Telephone Encounter (Signed)
Will see today.  

## 2021-12-28 NOTE — Progress Notes (Deleted)
Patient ID: Christine Serrano, female    DOB: Jun 22, 1960, 61 y.o.   MRN: [id number]  This visit was conducted in person.  LMP  (LMP Unknown)    CC: worsening abd pain  Subjective:   HPI: Christine Serrano is a 61 y.o. female presenting on 12/28/2021 for No chief complaint on file.   Here for follow-up of right lower quadrant abdominal pain.  Seen last month, lab work was unrevealing, contrasted CT abdomen pelvis was ordered but never scheduled.  ***  Se[facility] at [facility] ER on Monday 7/10 with chronic dyspnea and new chest pain/tightness, chest x-ray showed severe emphysema with chronic biapical scarring but no acute process.  Diagnosed with COPD exacerbation, treated with Solu-Medrol IM and albuterol nebulizer, patient left prior to completing evaluation.  She is doing better from respiratory standpoint.  Due for Prolia injection, however calcium during ER evaluation was low at 8.1, this does correct to 9.2 given hypoalbuminemia.  Lab Results  Component Value Date   CALCIUM 8.1 (L) 12/24/2021   PHOS 3.2 09/11/2019   Lab Results  Component Value Date   VD25OH 58.47 06/25/2021    Declined hospice - she would not have been able to use motorized scooter or portable oxygen concentrator. Now [facility] involved - personal care aide.      Relevant past medical, surgical, family and social history reviewed and updated as indicated. Interim medical history since our last visit reviewed. Allergies and medications reviewed and updated. Outpatient Medications Prior to Visit  Medication Sig Dispense Refill   albuterol (PROVENTIL) (2.5 MG/3ML) 0.083% nebulizer solution Take 3 mLs (2.5 mg total) by nebulization every 6 (six) hours as needed for wheezing or shortness of breath. 150 mL 6   albuterol (VENTOLIN HFA) 108 (90 Base) MCG/ACT inhaler TAKE 2 PUFFS BY MOUTH EVERY 6 HOURS AS NEEDED FOR WHEEZE OR SHORTNESS OF BREATH 25.5 each 2   Ascorbic Acid (VITAMIN C WITH ROSE HIPS) 500 MG tablet  Take 500 mg by mouth daily.     aspirin EC 81 MG tablet Take 81 mg by mouth daily.     benzonatate (TESSALON) 100 MG capsule TAKE 1 CAPSULE BY MOUTH THREE TIMES A DAY AS NEEDED FOR COUGH. 30 capsule 3   Budeson-Glycopyrrol-Formoterol (BREZTRI AEROSPHERE) 160-9-4.8 MCG/ACT AERO Inhale 2 puffs into the lungs in the morning and at bedtime. 32.1 g 3   buPROPion (WELLBUTRIN XL) 150 MG 24 hr tablet Take 1 tablet (150 mg total) by mouth daily. 90 tablet 1   cholecalciferol (VITAMIN D3) 25 MCG (1000 UNIT) tablet Take 1,000 Units by mouth daily.     fluticasone (FLONASE) 50 MCG/ACT nasal spray Place 2 sprays into both nostrils daily. 48 g 3   folic acid (FOLVITE) 1 MG tablet Take 1 tablet (1 mg total) by mouth daily. 90 tablet 1   gabapentin (NEURONTIN) 100 MG capsule Take 1 capsule (100 mg total) by mouth 2 (two) times daily. 180 capsule 1   hydrOXYzine (ATARAX) 25 MG tablet Take 1 tablet (25 mg total) by mouth 2 (two) times daily as needed for anxiety. 60 tablet 6   Iron, Ferrous Sulfate, 325 (65 Fe) MG TABS Take 325 mg by mouth daily. 30 tablet    LINZESS 290 MCG CAPS capsule Take 1 capsule (290 mcg total) by mouth daily as needed (constipation). TAKE 1 CAPSULE EVERY DAY AS NEEDED FOR CONSTIPATION AS DIRECTED 90 capsule 1   meclizine (ANTIVERT) 25 MG tablet Take 1 tablet (25  mg total) by mouth 3 (three) times daily as needed for dizziness. 30 tablet 0   methocarbamol (ROBAXIN) 500 MG tablet TAKE 1 TABLET BY MOUTH 3 TIMES DAILY AS NEEDED FOR MUSCLE SPASMS (SEDATION PRECAUTIONS). 40 tablet 3   methylPREDNISolone (MEDROL DOSEPAK) 4 MG TBPK tablet Take as directed in the package, this is a taper pack. 21 tablet 0   mirtazapine (REMERON) 15 MG tablet Take 1 tablet (15 mg total) by mouth at bedtime. 90 tablet 1   Multiple Vitamin (MULTIVITAMIN WITH MINERALS) TABS tablet Take 1 tablet by mouth daily.     naproxen (NAPROSYN) 375 MG tablet TAKE 1 TABLET BY MOUTH TWICE  DAILY AS NEEDED FOR PAIN WITH  FOOD 60 tablet  4   omeprazole (PRILOSEC) 40 MG capsule Take 1 capsule (40 mg total) by mouth daily as needed (GERD). 90 capsule 1   OXYGEN Inhale 4 L/min into the lungs continuous.      polyethylene glycol powder (GLYCOLAX/MIRALAX) 17 GM/SCOOP powder Take 17 g by mouth daily as needed for moderate constipation. 3350 g 1   Potassium 99 MG TABS Take by mouth.     predniSONE (DELTASONE) 5 MG tablet Take 2 tablets (10 mg total) by mouth daily with breakfast. 60 tablet 2   sertraline (ZOLOFT) 100 MG tablet Take 1.5 tablets (150 mg total) by mouth daily. 135 tablet 1   thiamine 100 MG tablet Take 1 tablet (100 mg total) by mouth daily. 30 tablet 0   vitamin B-12 (CYANOCOBALAMIN) 1000 MCG tablet Take 1 tablet (1,000 mcg total) by mouth daily.     No facility-administered medications prior to visit.     Per HPI unless specifically indicated in ROS section below Review of Systems  Objective:  LMP  (LMP Unknown)   Wt Readings from Last 3 Encounters:  11/28/21 90 lb 2 oz (40.9 kg)  08/31/21 97 lb (44 kg)  08/28/21 97 lb 12.8 oz (44.4 kg)      Physical Exam    Results for orders placed or performed during the hospital encounter of 12/24/21  Comprehensive metabolic panel  Result Value Ref Range   Sodium 138 135 - 145 mmol/L   Potassium 3.6 3.5 - 5.1 mmol/L   Chloride 96 (L) 98 - 111 mmol/L   CO2 29 22 - 32 mmol/L   Glucose, Bld 101 (H) 70 - 99 mg/dL   BUN 6 (L) 8 - 23 mg/dL   Creatinine, Ser 0.54 0.44 - 1.00 mg/dL   Calcium 8.1 (L) 8.9 - 10.3 mg/dL   Total Protein 4.5 (L) 6.5 - 8.1 g/dL   Albumin 2.6 (L) 3.5 - 5.0 g/dL   AST 26 15 - 41 U/L   ALT 13 0 - 44 U/L   Alkaline Phosphatase 89 38 - 126 U/L   Total Bilirubin 0.3 0.3 - 1.2 mg/dL   GFR, Estimated >60 >60 mL/min   Anion gap 13 5 - 15  CBC with Differential/Platelet  Result Value Ref Range   WBC 5.5 4.0 - 10.5 K/uL   RBC 4.01 3.87 - 5.11 MIL/uL   Hemoglobin 11.8 (L) 12.0 - 15.0 g/dL   HCT 36.3 36.0 - 46.0 %   MCV 90.5 80.0 - 100.0 fL   MCH  29.4 26.0 - 34.0 pg   MCHC 32.5 30.0 - 36.0 g/dL   RDW 13.7 11.5 - 15.5 %   Platelets 197 150 - 400 K/uL   nRBC 0.0 0.0 - 0.2 %   Neutrophils Relative % 78 %  Neutro Abs 4.2 1.7 - 7.7 K/uL   Lymphocytes Relative 19 %   Lymphs Abs 1.1 0.7 - 4.0 K/uL   Monocytes Relative 3 %   Monocytes Absolute 0.2 0.1 - 1.0 K/uL   Eosinophils Relative 0 %   Eosinophils Absolute 0.0 0.0 - 0.5 K/uL   Basophils Relative 0 %   Basophils Absolute 0.0 0.0 - 0.1 K/uL   Immature Granulocytes 0 %   Abs Immature Granulocytes 0.02 0.00 - 0.07 K/uL  Brain natriuretic peptide  Result Value Ref Range   B Natriuretic Peptide 159.1 (H) 0.0 - 100.0 pg/mL  Troponin I (High Sensitivity)  Result Value Ref Range   Troponin I (High Sensitivity) 13 <18 ng/L    Assessment & Plan:   Problem List Items Addressed This Visit   None    No orders of the defined types were placed in this encounter.  No orders of the defined types were placed in this encounter.    ***Follow up plan: No follow-ups on file.  Ria Bush, MD

## 2022-01-02 DIAGNOSIS — J9611 Chronic respiratory failure with hypoxia: Secondary | ICD-10-CM | POA: Diagnosis not present

## 2022-01-02 DIAGNOSIS — Z7409 Other reduced mobility: Secondary | ICD-10-CM | POA: Diagnosis not present

## 2022-01-02 DIAGNOSIS — J449 Chronic obstructive pulmonary disease, unspecified: Secondary | ICD-10-CM | POA: Diagnosis not present

## 2022-01-04 ENCOUNTER — Other Ambulatory Visit: Payer: Self-pay | Admitting: Pulmonary Disease

## 2022-01-08 NOTE — Telephone Encounter (Signed)
Labs done on 11/28/21 Calcium normal at 9.  CrCl is 74.36 mL/min.  Dr Darnell Level is it ok to use the labs from 11/28/21 prior to Prolia injection?

## 2022-01-08 NOTE — Telephone Encounter (Addendum)
No - more recent ER labs abnormal. Recommend OV with labs prior to rpt prolia.

## 2022-01-08 NOTE — Telephone Encounter (Signed)
Please schedule patient with Dr Darnell Level for Er Follow up, in first available spot.

## 2022-01-09 ENCOUNTER — Ambulatory Visit: Payer: Medicare Other | Admitting: Family Medicine

## 2022-01-15 ENCOUNTER — Ambulatory Visit (INDEPENDENT_AMBULATORY_CARE_PROVIDER_SITE_OTHER): Payer: Medicare Other | Admitting: Family Medicine

## 2022-01-15 ENCOUNTER — Encounter: Payer: Self-pay | Admitting: Family Medicine

## 2022-01-15 VITALS — BP 108/78 | HR 118 | Temp 98.0°F | Ht 65.0 in | Wt 86.2 lb

## 2022-01-15 DIAGNOSIS — Z9981 Dependence on supplemental oxygen: Secondary | ICD-10-CM

## 2022-01-15 DIAGNOSIS — Z720 Tobacco use: Secondary | ICD-10-CM

## 2022-01-15 DIAGNOSIS — J449 Chronic obstructive pulmonary disease, unspecified: Secondary | ICD-10-CM | POA: Diagnosis not present

## 2022-01-15 DIAGNOSIS — J9611 Chronic respiratory failure with hypoxia: Secondary | ICD-10-CM

## 2022-01-15 DIAGNOSIS — M810 Age-related osteoporosis without current pathological fracture: Secondary | ICD-10-CM

## 2022-01-15 DIAGNOSIS — D509 Iron deficiency anemia, unspecified: Secondary | ICD-10-CM

## 2022-01-15 DIAGNOSIS — Z515 Encounter for palliative care: Secondary | ICD-10-CM

## 2022-01-15 DIAGNOSIS — E440 Moderate protein-calorie malnutrition: Secondary | ICD-10-CM | POA: Diagnosis not present

## 2022-01-15 DIAGNOSIS — R64 Cachexia: Secondary | ICD-10-CM

## 2022-01-15 DIAGNOSIS — R000 Tachycardia, unspecified: Secondary | ICD-10-CM | POA: Diagnosis not present

## 2022-01-15 DIAGNOSIS — Z7189 Other specified counseling: Secondary | ICD-10-CM

## 2022-01-15 DIAGNOSIS — F332 Major depressive disorder, recurrent severe without psychotic features: Secondary | ICD-10-CM

## 2022-01-15 LAB — COMPREHENSIVE METABOLIC PANEL
ALT: 12 U/L (ref 0–35)
AST: 23 U/L (ref 0–37)
Albumin: 3 g/dL — ABNORMAL LOW (ref 3.5–5.2)
Alkaline Phosphatase: 135 U/L — ABNORMAL HIGH (ref 39–117)
BUN: 5 mg/dL — ABNORMAL LOW (ref 6–23)
CO2: 43 mEq/L — ABNORMAL HIGH (ref 19–32)
Calcium: 9.5 mg/dL (ref 8.4–10.5)
Chloride: 92 mEq/L — ABNORMAL LOW (ref 96–112)
Creatinine, Ser: 0.54 mg/dL (ref 0.40–1.20)
GFR: 98.25 mL/min (ref >60.00)
Glucose, Bld: 99 mg/dL (ref 70–99)
Potassium: 3.4 mEq/L — ABNORMAL LOW (ref 3.5–5.1)
Sodium: 143 mEq/L (ref 135–145)
Total Bilirubin: 0.3 mg/dL (ref 0.2–1.2)
Total Protein: 5.3 g/dL — ABNORMAL LOW (ref 6.0–8.3)

## 2022-01-15 NOTE — Assessment & Plan Note (Signed)
Hesitant for hospice as they would not be able to continue providing motorized scooter use or portable oxygen concentrator.

## 2022-01-15 NOTE — Assessment & Plan Note (Signed)
Chronic, ongoing.

## 2022-01-15 NOTE — Assessment & Plan Note (Signed)
Continues regular oral iron supplementation

## 2022-01-15 NOTE — Assessment & Plan Note (Signed)
Update albumin. Encouraged regular boost use as well as other nutritional measures.

## 2022-01-15 NOTE — Assessment & Plan Note (Addendum)
Update Ca, alb, ionized calcium.  She continues vitamin D 1000 IU daily.

## 2022-01-15 NOTE — Assessment & Plan Note (Signed)
Severe, oxygen dependent, prednisone dependent, continued smoker. Appreciate pulmonary and palliative care. She qualifies for hospice but does not want to pursue given limitations in portable oxygen and scooter use.

## 2022-01-15 NOTE — Assessment & Plan Note (Signed)
Chronic, continues sertraline '150mg'$  daily, wellbutrin XL '150mg'$  daily, remeron '15mg'$  nightly.

## 2022-01-15 NOTE — Progress Notes (Signed)
Patient ID: Christine Serrano, female    DOB: 1960/09/11, 61 y.o.   MRN: [id number]  This visit was conducted in person.  BP 108/78   Pulse (!) 118   Temp 98 F (36.7 C) (Temporal)   Ht '5\' 5"'$  (1.651 m)   Wt 86 lb 4 oz (39.1 kg)   LMP  (LMP Unknown)   SpO2 92% Comment: 4 L  BMI 14.35 kg/m    CC: ER f/u visit  Subjective:   HPI: Christine Serrano is a 61 y.o. female presenting on 01/15/2022 for Hospitalization Follow-up (Seen on 12/24/21 at [facility] ED, dx COPD exacerbation. Pt accompanied by roommate, Angie. )   Partner Angie is receiving chemo for lung cancer.   Recent ER visit for COPD exacerbation at the beginning of last month presenting with chest tightness, note reviewed. Actually she states she went to ER for racing heart to 150s. Known severe chronic oxygen and prednisone dependent COPD, continued smoker, palliative care involved.  Chest x-ray at the time showed severe emphysema with chronic biapical scarring and aortic atherosclerosis, as well as findings suggestive of pulmonary artery hypertension however no pneumonia.  She was treated with Solu-Medrol injection and albuterol nebulizer.  No recent fevers/chills.  Chronic cough and dyspnea. Persistent chest tightness.  Notes ongoing weight loss - no appetite. She does note she drinks boost 2 a day and some chocolate milk no oral intake.  No further abdominal pain.   Depression/anxiety - overall stable period on sertraline '150mg'$  daily, wellbutrin XL '150mg'$  daily, along with remeron '15mg'$  nightly.   Missed/late canceled a couple of office visits in the interim.   She has personal care aide twice weekly from [place] not involved due to limitations with portable oxygen concentrator.   Advanced directive discussion: has at home. HCPOA is partner [name]. Discussed today - would want to be DNR, does not want CPR. Would not want prolonged life support. Would not want intubation. Asked to bring [place] a copy.       Relevant past medical, surgical, family and social history reviewed and updated as indicated. Interim medical history since our last visit reviewed. Allergies and medications reviewed and updated. Outpatient Medications Prior to Visit  Medication Sig Dispense Refill   albuterol (PROVENTIL) (2.5 MG/3ML) 0.083% nebulizer solution Take 3 mLs (2.5 mg total) by nebulization every 6 (six) hours as needed for wheezing or shortness of breath. 150 mL 6   albuterol (VENTOLIN HFA) 108 (90 Base) MCG/ACT inhaler TAKE 2 PUFFS BY MOUTH EVERY 6 HOURS AS NEEDED FOR WHEEZE OR SHORTNESS OF BREATH 25.5 each 2   Ascorbic Acid (VITAMIN C WITH ROSE HIPS) 500 MG tablet Take 500 mg by mouth daily.     aspirin EC 81 MG tablet Take 81 mg by mouth daily.     benzonatate (TESSALON) 100 MG capsule TAKE 1 CAPSULE BY MOUTH THREE TIMES A DAY AS NEEDED FOR COUGH. 30 capsule 3   Budeson-Glycopyrrol-Formoterol (BREZTRI AEROSPHERE) 160-9-4.8 MCG/ACT AERO Inhale 2 puffs into the lungs in the morning and at bedtime. 32.1 g 3   buPROPion (WELLBUTRIN XL) 150 MG 24 hr tablet Take 1 tablet (150 mg total) by mouth daily. 90 tablet 1   cholecalciferol (VITAMIN D3) 25 MCG (1000 UNIT) tablet Take 1,000 Units by mouth daily.     fluticasone (FLONASE) 50 MCG/ACT nasal spray Place 2 sprays into both nostrils daily. 48 g 3   folic acid (FOLVITE) 1 MG tablet Take 1  tablet (1 mg total) by mouth daily. 90 tablet 1   gabapentin (NEURONTIN) 100 MG capsule Take 1 capsule (100 mg total) by mouth 2 (two) times daily. 180 capsule 1   hydrOXYzine (ATARAX) 25 MG tablet Take 1 tablet (25 mg total) by mouth 2 (two) times daily as needed for anxiety. 60 tablet 6   Iron, Ferrous Sulfate, 325 (65 Fe) MG TABS Take 325 mg by mouth daily. 30 tablet    meclizine (ANTIVERT) 25 MG tablet Take 1 tablet (25 mg total) by mouth 3 (three) times daily as needed for dizziness. 30 tablet 0   methocarbamol (ROBAXIN) 500 MG tablet TAKE 1 TABLET BY MOUTH 3 TIMES DAILY AS  NEEDED FOR MUSCLE SPASMS (SEDATION PRECAUTIONS). 40 tablet 3   mirtazapine (REMERON) 15 MG tablet Take 1 tablet (15 mg total) by mouth at bedtime. 90 tablet 1   Multiple Vitamin (MULTIVITAMIN WITH MINERALS) TABS tablet Take 1 tablet by mouth daily.     naproxen (NAPROSYN) 375 MG tablet TAKE 1 TABLET BY MOUTH TWICE  DAILY AS NEEDED FOR PAIN WITH  FOOD 60 tablet 4   omeprazole (PRILOSEC) 40 MG capsule Take 1 capsule (40 mg total) by mouth daily as needed (GERD). 90 capsule 1   OXYGEN Inhale 4 L/min into the lungs continuous.      polyethylene glycol powder (GLYCOLAX/MIRALAX) 17 GM/SCOOP powder Take 17 g by mouth daily as needed for moderate constipation. 3350 g 1   Potassium 99 MG TABS Take by mouth.     predniSONE (DELTASONE) 5 MG tablet TAKE 2 TABLETS BY MOUTH DAILY WITH BREAKFAST. 60 tablet 2   sertraline (ZOLOFT) 100 MG tablet Take 1.5 tablets (150 mg total) by mouth daily. 135 tablet 1   thiamine 100 MG tablet Take 1 tablet (100 mg total) by mouth daily. 30 tablet 0   vitamin B-12 (CYANOCOBALAMIN) 1000 MCG tablet Take 1 tablet (1,000 mcg total) by mouth daily.     methylPREDNISolone (MEDROL DOSEPAK) 4 MG TBPK tablet Take as directed in the package, this is a taper pack. 21 tablet 0   LINZESS 290 MCG CAPS capsule Take 1 capsule (290 mcg total) by mouth daily as needed (constipation). TAKE 1 CAPSULE EVERY DAY AS NEEDED FOR CONSTIPATION AS DIRECTED (Patient not taking: Reported on 01/15/2022) 90 capsule 1   No facility-administered medications prior to visit.     Per HPI unless specifically indicated in ROS section below Review of Systems  Objective:  BP 108/78   Pulse (!) 118   Temp 98 F (36.7 C) (Temporal)   Ht '5\' 5"'$  (1.651 m)   Wt 86 lb 4 oz (39.1 kg)   LMP  (LMP Unknown)   SpO2 92% Comment: 4 L  BMI 14.35 kg/m   Wt Readings from Last 3 Encounters:  01/15/22 86 lb 4 oz (39.1 kg)  11/28/21 90 lb 2 oz (40.9 kg)  08/31/21 97 lb (44 kg)      Physical Exam Vitals and nursing note  reviewed.  Constitutional:      Appearance: She is cachectic. She is ill-appearing. She is not toxic-appearing.     Comments: In wheelchair with portable oxygen concentrator  Cardiovascular:     Rate and Rhythm: Regular rhythm. Tachycardia present.     Pulses: Normal pulses.     Heart sounds: Normal heart sounds. No murmur heard. Pulmonary:     Effort: Pulmonary effort is normal. No respiratory distress.     Breath sounds: Wheezing (mild) and rhonchi present. No  rales.     Comments: Coarse throughout, diminished air movement Musculoskeletal:     Right lower leg: No edema.     Left lower leg: No edema.  Skin:    General: Skin is warm and dry.  Neurological:     Mental Status: She is alert.  Psychiatric:        Mood and Affect: Mood normal.        Behavior: Behavior normal.       Results for orders placed or performed during the hospital encounter of 12/24/21  Comprehensive metabolic panel  Result Value Ref Range   Sodium 138 135 - 145 mmol/L   Potassium 3.6 3.5 - 5.1 mmol/L   Chloride 96 (L) 98 - 111 mmol/L   CO2 29 22 - 32 mmol/L   Glucose, Bld 101 (H) 70 - 99 mg/dL   BUN 6 (L) 8 - 23 mg/dL   Creatinine, Ser 0.54 0.44 - 1.00 mg/dL   Calcium 8.1 (L) 8.9 - 10.3 mg/dL   Total Protein 4.5 (L) 6.5 - 8.1 g/dL   Albumin 2.6 (L) 3.5 - 5.0 g/dL   AST 26 15 - 41 U/L   ALT 13 0 - 44 U/L   Alkaline Phosphatase 89 38 - 126 U/L   Total Bilirubin 0.3 0.3 - 1.2 mg/dL   GFR, Estimated >60 >60 mL/min   Anion gap 13 5 - 15  CBC with Differential/Platelet  Result Value Ref Range   WBC 5.5 4.0 - 10.5 K/uL   RBC 4.01 3.87 - 5.11 MIL/uL   Hemoglobin 11.8 (L) 12.0 - 15.0 g/dL   HCT 36.3 36.0 - 46.0 %   MCV 90.5 80.0 - 100.0 fL   MCH 29.4 26.0 - 34.0 pg   MCHC 32.5 30.0 - 36.0 g/dL   RDW 13.7 11.5 - 15.5 %   Platelets 197 150 - 400 K/uL   nRBC 0.0 0.0 - 0.2 %   Neutrophils Relative % 78 %   Neutro Abs 4.2 1.7 - 7.7 K/uL   Lymphocytes Relative 19 %   Lymphs Abs 1.1 0.7 - 4.0 K/uL    Monocytes Relative 3 %   Monocytes Absolute 0.2 0.1 - 1.0 K/uL   Eosinophils Relative 0 %   Eosinophils Absolute 0.0 0.0 - 0.5 K/uL   Basophils Relative 0 %   Basophils Absolute 0.0 0.0 - 0.1 K/uL   Immature Granulocytes 0 %   Abs Immature Granulocytes 0.02 0.00 - 0.07 K/uL  Brain natriuretic peptide  Result Value Ref Range   B Natriuretic Peptide 159.1 (H) 0.0 - 100.0 pg/mL  Troponin I (High Sensitivity)  Result Value Ref Range   Troponin I (High Sensitivity) 13 <18 ng/L    Assessment & Plan:   Problem List Items Addressed This Visit     Advanced directives, counseling/discussion (Chronic)    Advanced directive discussion: has at home. HCPOA is partner [name]. Discussed today - would want to be DNR, does not want CPR. Would not want prolonged life support. Would not want intubation. Asked to bring [place] a copy.       Palliative care status (Chronic)    Hesitant for hospice as they would not be able to continue providing motorized scooter use or portable oxygen concentrator.       MDD (major depressive disorder), recurrent episode, severe (HCC)    Chronic, continues sertraline '150mg'$  daily, wellbutrin XL '150mg'$  daily, remeron '15mg'$  nightly.       COPD, very severe ([place]) - Primary  Severe, oxygen dependent, prednisone dependent, continued smoker. Appreciate pulmonary and palliative care. She qualifies for hospice but does not want to pursue given limitations in portable oxygen and scooter use.      Tachycardia    Chronic, ongoing.       Tobacco abuse   Osteoporosis    Pending prolia however recently with hypocalcemia in setting of hypoalbuminemia. Will update Ca, albumin, and ionized calcium today.       IDA (iron deficiency anemia)    Continues regular oral iron supplementation      Hypocalcemia    Update Ca, alb, ionized calcium.  She continues vitamin D 1000 IU daily.       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 Encouraged regular boost use as well as other nutritional measures.       Protein-calorie malnutrition ([place])     No orders of the defined types were placed in this encounter.  Orders Placed This Encounter  Procedures   Comprehensive metabolic panel   Calcium, ionized     Patient Instructions  Labs today Continue boost supplements twice daily as well as chocolate milk.  Try to eat some solid food every day.  If any worsening, we should reconsider hospice for more assistance at home.  Bring [place] a copy of your living will to update your chart.  Return in 1 month for follow up visit.   Follow up plan: Return in about 4 weeks (around 02/12/2022), or if symptoms worsen or fail to improve, for follow up visit.  Ria Bush, MD

## 2022-01-15 NOTE — Assessment & Plan Note (Signed)
Advanced directive discussion: has at home. HCPOA is partner [name]. Discussed today - would want to be DNR, does not want CPR. Would not want prolonged life support. Would not want intubation. Asked to bring [place] a copy.

## 2022-01-15 NOTE — Patient Instructions (Addendum)
Labs today Continue boost supplements twice daily as well as chocolate milk.  Try to eat some solid food every day.  If any worsening, we should reconsider hospice for more assistance at home.  Bring [place] a copy of your living will to update your chart.  Return in 1 month for follow up visit.

## 2022-01-15 NOTE — Assessment & Plan Note (Signed)
Pending prolia however recently with hypocalcemia in setting of hypoalbuminemia. Will update Ca, albumin, and ionized calcium today.

## 2022-01-16 LAB — CALCIUM, IONIZED: Calcium, Ion: 5.1 mg/dL (ref 4.7–5.5)

## 2022-01-24 ENCOUNTER — Ambulatory Visit: Payer: Medicare Other

## 2022-01-29 ENCOUNTER — Ambulatory Visit (INDEPENDENT_AMBULATORY_CARE_PROVIDER_SITE_OTHER): Payer: Medicare Other

## 2022-01-29 DIAGNOSIS — M810 Age-related osteoporosis without current pathological fracture: Secondary | ICD-10-CM

## 2022-01-29 MED ORDER — DENOSUMAB 60 MG/ML ~~LOC~~ SOSY
60.0000 mg | PREFILLED_SYRINGE | Freq: Once | SUBCUTANEOUS | Status: AC
  Administered 2022-01-29: 60 mg via SUBCUTANEOUS

## 2022-01-29 NOTE — Telephone Encounter (Signed)
Pt given Prolia inj today, per Dr. Darnell Level.  (See 01/15/22 Labs, Result Notes)  Fyi to [name].

## 2022-01-29 NOTE — Progress Notes (Signed)
Per orders of Dr. Danise Mina, injection of Prolia given by Brenton Grills. Patient tolerated injection well.

## 2022-02-02 DIAGNOSIS — J9611 Chronic respiratory failure with hypoxia: Secondary | ICD-10-CM | POA: Diagnosis not present

## 2022-02-02 DIAGNOSIS — Z7409 Other reduced mobility: Secondary | ICD-10-CM | POA: Diagnosis not present

## 2022-02-02 DIAGNOSIS — J449 Chronic obstructive pulmonary disease, unspecified: Secondary | ICD-10-CM | POA: Diagnosis not present

## 2022-02-15 ENCOUNTER — Ambulatory Visit: Payer: Medicare Other | Admitting: Family Medicine

## 2022-02-15 NOTE — Telephone Encounter (Signed)
I do not see where this CT scan was ever done. I did not see nor did I handle this order... another referral coordinator worked on this one. I am not sure if she ever contacted the patient to get her scheduled.   I see that the patient was seen in office 01/15/2022 by Dr Darnell Level since this order was placed. Pt no showed her acute visit with Dr Darnell Level on 12/28/2021.  Is this CT still needed? If so, the patient can call to schedule it at her convenience at [facility], [phone] Opt 3, opt 2

## 2022-02-15 NOTE — Telephone Encounter (Signed)
Abd pain has since resolved. May cancel CT. Thanks.

## 2022-02-19 ENCOUNTER — Other Ambulatory Visit: Payer: Self-pay | Admitting: Family Medicine

## 2022-02-20 NOTE — Telephone Encounter (Signed)
Gabapentin Last filled:  12/12/21, #180 Last OV:  01/15/22, ER f/u Next OV:  none

## 2022-02-21 NOTE — Telephone Encounter (Signed)
ERx 

## 2022-02-22 NOTE — Telephone Encounter (Signed)
Noted. Order cancelled

## 2022-03-05 DIAGNOSIS — J9611 Chronic respiratory failure with hypoxia: Secondary | ICD-10-CM | POA: Diagnosis not present

## 2022-03-05 DIAGNOSIS — Z7409 Other reduced mobility: Secondary | ICD-10-CM | POA: Diagnosis not present

## 2022-03-05 DIAGNOSIS — J449 Chronic obstructive pulmonary disease, unspecified: Secondary | ICD-10-CM | POA: Diagnosis not present

## 2022-03-12 ENCOUNTER — Ambulatory Visit: Payer: Medicare Other | Admitting: Pulmonary Disease

## 2022-03-27 ENCOUNTER — Telehealth: Payer: Self-pay | Admitting: Family Medicine

## 2022-03-27 DIAGNOSIS — J449 Chronic obstructive pulmonary disease, unspecified: Secondary | ICD-10-CM

## 2022-03-27 NOTE — Telephone Encounter (Signed)
Nebulizer machine for DME ordered and in chart. Plz send to [place].  Signed and in LIsa's box.

## 2022-03-27 NOTE — Telephone Encounter (Signed)
Pt called and requested a replacement nebulizer for her home? Pt stated her pharmacy stated pt needed a new prescription sent to [place].

## 2022-03-28 NOTE — Telephone Encounter (Addendum)
Noted.  [place] Message to [employer] letting them know and faxed order to them at [phone].

## 2022-03-28 NOTE — Telephone Encounter (Signed)
Received confirmation message from [facility] [place] the order was received.

## 2022-04-03 DIAGNOSIS — I5032 Chronic diastolic (congestive) heart failure: Secondary | ICD-10-CM | POA: Diagnosis not present

## 2022-04-03 DIAGNOSIS — J449 Chronic obstructive pulmonary disease, unspecified: Secondary | ICD-10-CM | POA: Diagnosis not present

## 2022-04-04 DIAGNOSIS — J9611 Chronic respiratory failure with hypoxia: Secondary | ICD-10-CM | POA: Diagnosis not present

## 2022-04-04 DIAGNOSIS — J449 Chronic obstructive pulmonary disease, unspecified: Secondary | ICD-10-CM | POA: Diagnosis not present

## 2022-04-04 DIAGNOSIS — Z7409 Other reduced mobility: Secondary | ICD-10-CM | POA: Diagnosis not present

## 2022-04-26 ENCOUNTER — Other Ambulatory Visit: Payer: Self-pay | Admitting: Pulmonary Disease

## 2022-04-30 ENCOUNTER — Other Ambulatory Visit: Payer: Self-pay | Admitting: Family Medicine

## 2022-04-30 ENCOUNTER — Telehealth: Payer: Self-pay

## 2022-04-30 NOTE — Telephone Encounter (Signed)
Pt said albuterol inhaler was supposed to be requested last week and then again today. Pt is out of albuterol inhaler and request to be refilled today. Sending to Dr Darnell Level and Danise Mina pool and I teams [place] CMA.

## 2022-04-30 NOTE — Telephone Encounter (Signed)
Patient notified by telephone as instructed.

## 2022-04-30 NOTE — Telephone Encounter (Signed)
Plz notify this was sent in.  ?

## 2022-04-30 NOTE — Telephone Encounter (Signed)
Albuterol inh (generic) Last filled:  03/30/22, #25.5 ea Last OV:  01/15/22, ER f/u Next OV:  05/07/22, RLQ abd pain?

## 2022-04-30 NOTE — Telephone Encounter (Signed)
Pt having rt lower abd pain that started about 6 wks ago and now dull pain frequency is increasing. Pt does not have ovary or fallopian tube on rt side; pt had cervical CA x 2 last time 1987 but pt has not had hysterectomy. Pt does not have appendix. Pt has diarrhea and constipation alternating and that is not unusual. Pt said notices pain when cat jumps in pts lap or if pt pushes in on rt side of abd. The pain is sharpe and only last 2 mins but pt really notices the pain.pt scheduled appt for next wk; pt prefers to see Dr Darnell Level later in the day pt does not get up early morning. Offered pt appt for 05/01/22 with another provider but too early in AM. Lattie Haw CMA said I could take same day appt on 05/03/22 at 12 noon. Pt appreciative and UC & ED precautions given and pt voiced understanding. Sending note to Dr Darnell Level and G pool.

## 2022-05-03 ENCOUNTER — Ambulatory Visit: Payer: Medicare Other | Admitting: Family Medicine

## 2022-05-04 DIAGNOSIS — J449 Chronic obstructive pulmonary disease, unspecified: Secondary | ICD-10-CM | POA: Diagnosis not present

## 2022-05-04 DIAGNOSIS — I5032 Chronic diastolic (congestive) heart failure: Secondary | ICD-10-CM | POA: Diagnosis not present

## 2022-05-05 DIAGNOSIS — J9611 Chronic respiratory failure with hypoxia: Secondary | ICD-10-CM | POA: Diagnosis not present

## 2022-05-05 DIAGNOSIS — J449 Chronic obstructive pulmonary disease, unspecified: Secondary | ICD-10-CM | POA: Diagnosis not present

## 2022-05-05 DIAGNOSIS — Z7409 Other reduced mobility: Secondary | ICD-10-CM | POA: Diagnosis not present

## 2022-05-07 ENCOUNTER — Ambulatory Visit: Payer: Medicare Other | Admitting: Family Medicine

## 2022-05-12 ENCOUNTER — Other Ambulatory Visit: Payer: Self-pay | Admitting: Family Medicine

## 2022-05-14 ENCOUNTER — Ambulatory Visit (INDEPENDENT_AMBULATORY_CARE_PROVIDER_SITE_OTHER): Payer: Medicare Other | Admitting: Family Medicine

## 2022-05-14 ENCOUNTER — Encounter: Payer: Self-pay | Admitting: Family Medicine

## 2022-05-14 VITALS — BP 100/60 | HR 90 | Temp 97.9°F | Ht 65.0 in | Wt 84.0 lb

## 2022-05-14 DIAGNOSIS — R1031 Right lower quadrant pain: Secondary | ICD-10-CM | POA: Diagnosis not present

## 2022-05-14 DIAGNOSIS — R634 Abnormal weight loss: Secondary | ICD-10-CM

## 2022-05-14 NOTE — Progress Notes (Signed)
Patient ID: Christine Serrano, female    DOB: Oct 31, 1960, 61 y.o.   MRN: [id number]  This visit was conducted in person.  BP 100/60   Pulse 90   Temp 97.9 F (36.6 C) (Temporal)   Ht '5\' 5"'$  (1.651 m)   Wt 84 lb (38.1 kg)   LMP  (LMP Unknown)   BMI 13.98 kg/m   BP Readings from Last 3 Encounters:  05/14/22 100/60  01/15/22 108/78  12/24/21 (!) 157/132    CC: abdominal pain  Subjective:   HPI: Christine Serrano is a 61 y.o. female presenting on 05/14/2022 for Abdominal Pain (Right lower quadrant pain present for a couple months)   Off and on RLQ abdominal pain present for months, only present with pressure in the area ie when cat sits in that area, present since at least 11/2021. CT abdomen ordered at that time, never completed. Associated with bowel changes (loose stools, green and black without blood in stool). Continued weight loss noted, 13 lbs since 11/2021.   No fevers/chills, nausea/vomiting, boring pain to back, dysuria, urgency or frequency.   She is on omeprazole '40mg'$  PRN GERD, not taking linzess or miralax.  She is on oral iron daily.   Endorses remote h/o cervical cancer s/p cold knife conization 1970s followed by laser ablation 1988.   H/o remote R fallopian tube rupture due to ectopic pregnancy 1990s.   Known severe O2 dependent COPD, continued smoker, followed by pulm. Not on hospice - she would not have been able to use motorized scooter or portable oxygen concentrator.  Now [facility] involved - personal care aide.   OP - latest prolia shot 01/29/2022.      Relevant past medical, surgical, family and social history reviewed and updated as indicated. Interim medical history since our last visit reviewed. Allergies and medications reviewed and updated. Outpatient Medications Prior to Visit  Medication Sig Dispense Refill   albuterol (PROVENTIL) (2.5 MG/3ML) 0.083% nebulizer solution Take 3 mLs (2.5 mg total) by nebulization every 6 (six) hours as needed  for wheezing or shortness of breath. 150 mL 6   albuterol (VENTOLIN HFA) 108 (90 Base) MCG/ACT inhaler TAKE 2 PUFFS BY MOUTH EVERY 6 HOURS AS NEEDED FOR WHEEZE OR SHORTNESS OF BREATH 18 each 3   Ascorbic Acid (VITAMIN C WITH ROSE HIPS) 500 MG tablet Take 500 mg by mouth daily.     aspirin EC 81 MG tablet Take 81 mg by mouth daily.     benzonatate (TESSALON) 100 MG capsule TAKE 1 CAPSULE BY MOUTH THREE TIMES A DAY AS NEEDED FOR COUGH. 30 capsule 3   Budeson-Glycopyrrol-Formoterol (BREZTRI AEROSPHERE) 160-9-4.8 MCG/ACT AERO Inhale 2 puffs into the lungs in the morning and at bedtime. 32.1 g 3   buPROPion (WELLBUTRIN XL) 150 MG 24 hr tablet Take 1 tablet (150 mg total) by mouth daily. 90 tablet 1   cholecalciferol (VITAMIN D3) 25 MCG (1000 UNIT) tablet Take 1,000 Units by mouth daily.     fluticasone (FLONASE) 50 MCG/ACT nasal spray Place 2 sprays into both nostrils daily. 48 g 3   folic acid (FOLVITE) 1 MG tablet Take 1 tablet (1 mg total) by mouth daily. 90 tablet 1   gabapentin (NEURONTIN) 100 MG capsule TAKE 1 CAPSULE BY MOUTH TWICE  DAILY 200 capsule 1   hydrOXYzine (ATARAX) 25 MG tablet Take 1 tablet (25 mg total) by mouth 2 (two) times daily as needed for anxiety. 60 tablet 6   Iron, Ferrous  Sulfate, 325 (65 Fe) MG TABS Take 325 mg by mouth daily. 30 tablet    LINZESS 290 MCG CAPS capsule Take 1 capsule (290 mcg total) by mouth daily as needed (constipation). TAKE 1 CAPSULE EVERY DAY AS NEEDED FOR CONSTIPATION AS DIRECTED 90 capsule 1   meclizine (ANTIVERT) 25 MG tablet Take 1 tablet (25 mg total) by mouth 3 (three) times daily as needed for dizziness. 30 tablet 0   methocarbamol (ROBAXIN) 500 MG tablet TAKE 1 TABLET BY MOUTH 3 TIMES DAILY AS NEEDED FOR MUSCLE SPASMS (SEDATION PRECAUTIONS). 40 tablet 3   mirtazapine (REMERON) 15 MG tablet Take 1 tablet (15 mg total) by mouth at bedtime. 90 tablet 1   Multiple Vitamin (MULTIVITAMIN WITH MINERALS) TABS tablet Take 1 tablet by mouth daily.      naproxen (NAPROSYN) 375 MG tablet TAKE 1 TABLET BY MOUTH TWICE  DAILY AS NEEDED FOR PAIN WITH  FOOD 60 tablet 4   omeprazole (PRILOSEC) 40 MG capsule TAKE 1 CAPSULE BY MOUTH DAILY AS NEEDED FOR GERD 90 capsule 1   OXYGEN Inhale 4 L/min into the lungs continuous.      polyethylene glycol powder (GLYCOLAX/MIRALAX) 17 GM/SCOOP powder Take 17 g by mouth daily as needed for moderate constipation. 3350 g 1   Potassium 99 MG TABS Take by mouth.     predniSONE (DELTASONE) 5 MG tablet TAKE 2 TABLETS BY MOUTH DAILY WITH BREAKFAST. 60 tablet 0   sertraline (ZOLOFT) 100 MG tablet Take 1.5 tablets (150 mg total) by mouth daily. 135 tablet 1   thiamine 100 MG tablet Take 1 tablet (100 mg total) by mouth daily. 30 tablet 0   vitamin B-12 (CYANOCOBALAMIN) 1000 MCG tablet Take 1 tablet (1,000 mcg total) by mouth daily.     No facility-administered medications prior to visit.     Per HPI unless specifically indicated in ROS section below Review of Systems  Objective:  BP 100/60   Pulse 90   Temp 97.9 F (36.6 C) (Temporal)   Ht '5\' 5"'$  (1.651 m)   Wt 84 lb (38.1 kg)   LMP  (LMP Unknown)   BMI 13.98 kg/m   Wt Readings from Last 3 Encounters:  05/14/22 84 lb (38.1 kg)  01/15/22 86 lb 4 oz (39.1 kg)  11/28/21 90 lb 2 oz (40.9 kg)      Physical Exam Vitals and nursing note reviewed.  Constitutional:      Appearance: Normal appearance. She is underweight.  HENT:     Mouth/Throat:     Mouth: Mucous membranes are moist.     Pharynx: Oropharynx is clear. No oropharyngeal exudate or posterior oropharyngeal erythema.  Eyes:     Extraocular Movements: Extraocular movements intact.     Pupils: Pupils are equal, round, and reactive to light.  Cardiovascular:     Rate and Rhythm: Normal rate and regular rhythm.     Pulses: Normal pulses.     Heart sounds: Normal heart sounds. No murmur heard. Pulmonary:     Effort: Pulmonary effort is normal. No respiratory distress.     Breath sounds: Wheezing  present. No rhonchi or rales.     Comments: Diffuse wheezing, short winded with short period standing Abdominal:     General: Bowel sounds are normal. There is no distension.     Palpations: Abdomen is soft. There is no mass.     Tenderness: There is abdominal tenderness in the right lower quadrant. There is no guarding or rebound. Negative signs include  Murphy's sign.     Hernia: No hernia is present. There is no hernia in the umbilical area.       Comments: Tender fullness to R inguinal region without obvious hernia  Musculoskeletal:     Right lower leg: No edema.     Left lower leg: No edema.  Skin:    General: Skin is warm and dry.     Findings: No rash.  Neurological:     Mental Status: She is alert.  Psychiatric:        Behavior: Behavior normal.       Results for orders placed or performed in visit on 01/15/22  Comprehensive metabolic panel  Result Value Ref Range   Sodium 143 135 - 145 mEq/L   Potassium 3.4 (L) 3.5 - 5.1 mEq/L   Chloride 92 (L) 96 - 112 mEq/L   CO2 43 (H) 19 - 32 mEq/L   Glucose, Bld 99 70 - 99 mg/dL   BUN 5 (L) 6 - 23 mg/dL   Creatinine, Ser 0.54 0.40 - 1.20 mg/dL   Total Bilirubin 0.3 0.2 - 1.2 mg/dL   Alkaline Phosphatase 135 (H) 39 - 117 U/L   AST 23 0 - 37 U/L   ALT 12 0 - 35 U/L   Total Protein 5.3 (L) 6.0 - 8.3 g/dL   Albumin 3.0 (L) 3.5 - 5.2 g/dL   GFR 98.25 >60.00 mL/min   Calcium 9.5 8.4 - 10.5 mg/dL  Calcium, ionized  Result Value Ref Range   Calcium, Ion 5.1 4.7 - 5.5 mg/dL    Assessment & Plan:   Problem List Items Addressed This Visit       Unprioritized   Right lower quadrant abdominal pain - Primary    Ongoing RLQ abd discomfort for the past 6 months, only present with palpation, associated with ongoing weight loss which could also be due to severe end stage COPD. She's continued having dark loose stools (she's on oral iron).  Will further evaluate with labs (CBC, CMP, lipase) and CT abd/pelvis with contrast.  # provided  for pt to call and schedule imaging study.       Relevant Orders   Comprehensive metabolic panel   CBC with Differential/Platelet   Lipase   CT Abdomen Pelvis W Contrast   Unintentional weight loss    In setting of severe COPD. Lost a total of 15 lbs this past year.         No orders of the defined types were placed in this encounter.  Orders Placed This Encounter  Procedures   CT Abdomen Pelvis W Contrast    Standing Status:   Future    Standing Expiration Date:   05/15/2023    Order Specific Question:   If indicated for the ordered procedure, I authorize the administration of contrast media per Radiology protocol    Answer:   Yes    Order Specific Question:   Does the patient have a contrast media/X-ray dye allergy?    Answer:   No    Order Specific Question:   Preferred imaging location?    Answer:   [facility]    Order Specific Question:   Release to patient    Answer:   Immediate [1]    Order Specific Question:   Is Oral Contrast requested for this exam?    Answer:   Yes, Per Radiology protocol   Comprehensive metabolic panel   CBC with Differential/Platelet   Lipase  Patient Instructions  Labs today I'd like to schedule abdominal/pelvic imaging study for further evaluation.  Call [facility] outpatient imaging center, [phone] to schedule CT scan.   Follow up plan: Return if symptoms worsen or fail to improve.  Ria Bush, MD

## 2022-05-14 NOTE — Patient Instructions (Addendum)
Labs today I'd like to schedule abdominal/pelvic imaging study for further evaluation.  Call [facility] outpatient imaging center, [phone] to schedule CT scan.

## 2022-05-15 ENCOUNTER — Telehealth: Payer: Self-pay | Admitting: Family Medicine

## 2022-05-15 LAB — CBC WITH DIFFERENTIAL/PLATELET
Basophils Absolute: 0.1 10*3/uL (ref 0.0–0.1)
Basophils Relative: 0.8 % (ref 0.0–3.0)
Eosinophils Absolute: 0.1 10*3/uL (ref 0.0–0.7)
Eosinophils Relative: 1 % (ref 0.0–5.0)
HCT: 36.4 % (ref 36.0–46.0)
Hemoglobin: 11.9 g/dL — ABNORMAL LOW (ref 12.0–15.0)
Lymphocytes Relative: 26.9 % (ref 12.0–46.0)
Lymphs Abs: 2.9 10*3/uL (ref 0.7–4.0)
MCHC: 32.7 g/dL (ref 30.0–36.0)
MCV: 90.8 fl (ref 78.0–100.0)
Monocytes Absolute: 1 10*3/uL (ref 0.1–1.0)
Monocytes Relative: 8.8 % (ref 3.0–12.0)
Neutro Abs: 6.8 10*3/uL (ref 1.4–7.7)
Neutrophils Relative %: 62.5 % (ref 43.0–77.0)
Platelets: 245 10*3/uL (ref 150.0–400.0)
RBC: 4.01 Mil/uL (ref 3.87–5.11)
RDW: 13.5 % (ref 11.5–15.5)
WBC: 11 10*3/uL — ABNORMAL HIGH (ref 4.0–10.5)

## 2022-05-15 LAB — COMPREHENSIVE METABOLIC PANEL
ALT: 12 U/L (ref 0–35)
AST: 23 U/L (ref 0–37)
Albumin: 3.1 g/dL — ABNORMAL LOW (ref 3.5–5.2)
Alkaline Phosphatase: 116 U/L (ref 39–117)
BUN: 8 mg/dL (ref 6–23)
CO2: 41 mEq/L — ABNORMAL HIGH (ref 19–32)
Calcium: 8.8 mg/dL (ref 8.4–10.5)
Chloride: 91 mEq/L — ABNORMAL LOW (ref 96–112)
Creatinine, Ser: 0.42 mg/dL (ref 0.40–1.20)
GFR: 104.14 mL/min (ref >60.00)
Glucose, Bld: 84 mg/dL (ref 70–99)
Potassium: 3.9 mEq/L (ref 3.5–5.1)
Sodium: 142 mEq/L (ref 135–145)
Total Bilirubin: 0.3 mg/dL (ref 0.2–1.2)
Total Protein: 5.4 g/dL — ABNORMAL LOW (ref 6.0–8.3)

## 2022-05-15 LAB — LIPASE: Lipase: 22 U/L (ref 11.0–59.0)

## 2022-05-15 NOTE — Assessment & Plan Note (Signed)
Ongoing RLQ abd discomfort for the past 6 months, only present with palpation, associated with ongoing weight loss which could also be due to severe end stage COPD. She's continued having dark loose stools (she's on oral iron).  Will further evaluate with labs (CBC, CMP, lipase) and CT abd/pelvis with contrast.  # provided for pt to call and schedule imaging study.

## 2022-05-15 NOTE — Telephone Encounter (Signed)
Spoke with pt about sleep issues.  Says she mentioned to Dr. Darnell Level at yesterday's [place] but also wanted to let him know neither sleep med seems to be working now.  Pt wants to know if they can be adjusted or if she can try something else.  Plz advise.  Pt also wants to make Dr. Darnell Level aware CT scheduled on 05/17/22 at 11:00.

## 2022-05-15 NOTE — Telephone Encounter (Signed)
Pt called in requesting a call back stated she's not sleeping and would like to discuss a change in medication . Please advise [phone]

## 2022-05-15 NOTE — Assessment & Plan Note (Signed)
In setting of severe COPD. Lost a total of 15 lbs this past year.

## 2022-05-17 ENCOUNTER — Ambulatory Visit: Admission: RE | Admit: 2022-05-17 | Payer: Medicare Other | Source: Ambulatory Visit

## 2022-05-17 ENCOUNTER — Other Ambulatory Visit: Payer: Self-pay | Admitting: Pulmonary Disease

## 2022-05-17 NOTE — Telephone Encounter (Signed)
Lvm asking pt to call back. Need to relay Dr. G's message and get answer to his question.  

## 2022-05-17 NOTE — Telephone Encounter (Signed)
Patient called and stated she was returning [place] call.

## 2022-05-17 NOTE — Telephone Encounter (Signed)
She's currently on mirtazapine '15mg'$  nightly for sleep as well as hydroxyzine PRN.  Has she tried melatonin in the past? If not, rec try 5-'10mg'$  at night time.  If ineffective, would probably change mirtazapine to trazodone.

## 2022-05-17 NOTE — Telephone Encounter (Signed)
Spoke to pt, she stated she's never tried "melatonin". Pt stated she'd try it out & let Dr. Darnell Level know how it goes. Call back # [phone]

## 2022-05-20 ENCOUNTER — Telehealth: Payer: Self-pay

## 2022-05-20 NOTE — Telephone Encounter (Signed)
Lvm asking pt to call back. Need to relay results and Dr. Synthia Innocent message. (See Labs, Result Notes- 05/14/22.)  Labs/Dr. Synthia Innocent msg: Your pancreas test returned normal as did kidneys and liver. Protein levels were low - continue working on 3 meals a day. White count was slightly elevated. Looks like you missed CT scan appointment - please call and reschedule.

## 2022-05-20 NOTE — Telephone Encounter (Signed)
Noted  

## 2022-05-21 NOTE — Telephone Encounter (Signed)
Lvm asking pt to call back. Need to relay results and Dr. Synthia Innocent message. (See Labs, Result Notes- 05/14/22.)  Labs/Dr. Synthia Innocent msg: Your pancreas test returned normal as did kidneys and liver. Protein levels were low - continue working on 3 meals a day. White count was slightly elevated. Looks like you missed CT scan appointment - please call and reschedule.

## 2022-05-22 NOTE — Telephone Encounter (Signed)
Lvm asking pt to call back. Need to relay results and Dr. Synthia Innocent message. (See Labs, Result Notes- 05/14/22.)  Mailing a letter.   Labs/Dr. Synthia Innocent msg: Your pancreas test returned normal as did kidneys and liver. Protein levels were low - continue working on 3 meals a day. White count was slightly elevated. Looks like you missed CT scan appointment - please call and reschedule.

## 2022-05-30 ENCOUNTER — Ambulatory Visit: Admission: RE | Admit: 2022-05-30 | Payer: Medicare Other | Source: Ambulatory Visit

## 2022-06-03 DIAGNOSIS — I5032 Chronic diastolic (congestive) heart failure: Secondary | ICD-10-CM | POA: Diagnosis not present

## 2022-06-03 DIAGNOSIS — J449 Chronic obstructive pulmonary disease, unspecified: Secondary | ICD-10-CM | POA: Diagnosis not present

## 2022-06-04 ENCOUNTER — Ambulatory Visit: Payer: Medicare Other | Admitting: Family Medicine

## 2022-06-04 DIAGNOSIS — Z7409 Other reduced mobility: Secondary | ICD-10-CM | POA: Diagnosis not present

## 2022-06-04 DIAGNOSIS — J9611 Chronic respiratory failure with hypoxia: Secondary | ICD-10-CM | POA: Diagnosis not present

## 2022-06-04 DIAGNOSIS — J449 Chronic obstructive pulmonary disease, unspecified: Secondary | ICD-10-CM | POA: Diagnosis not present

## 2022-06-07 ENCOUNTER — Ambulatory Visit: Payer: Medicare Other

## 2022-06-07 ENCOUNTER — Telehealth: Payer: Self-pay

## 2022-06-07 NOTE — Telephone Encounter (Signed)
Unable to reach patient for scheduled AWV. No answer. Left message. Okay to reschedule.

## 2022-06-11 ENCOUNTER — Ambulatory Visit: Admission: RE | Admit: 2022-06-11 | Payer: Medicare Other | Source: Ambulatory Visit

## 2022-06-12 ENCOUNTER — Other Ambulatory Visit: Payer: Medicare Other

## 2022-06-12 ENCOUNTER — Ambulatory Visit: Admission: RE | Admit: 2022-06-12 | Payer: Medicare Other | Source: Ambulatory Visit

## 2022-06-14 ENCOUNTER — Ambulatory Visit: Payer: Medicare Other

## 2022-06-17 ENCOUNTER — Other Ambulatory Visit: Payer: Self-pay | Admitting: Pulmonary Disease

## 2022-06-17 ENCOUNTER — Other Ambulatory Visit: Payer: Self-pay | Admitting: Family Medicine

## 2022-06-17 DIAGNOSIS — F331 Major depressive disorder, recurrent, moderate: Secondary | ICD-10-CM

## 2022-06-17 DIAGNOSIS — F419 Anxiety disorder, unspecified: Secondary | ICD-10-CM

## 2022-06-18 ENCOUNTER — Ambulatory Visit: Payer: Medicare Other | Admitting: Family Medicine

## 2022-06-18 ENCOUNTER — Ambulatory Visit: Payer: Medicare Other | Admitting: Pulmonary Disease

## 2022-06-18 NOTE — Telephone Encounter (Signed)
Dr. Patsey Berthold, please advise if okay to refill prednisone '5mg'$ ?

## 2022-06-18 NOTE — Telephone Encounter (Signed)
Ok to fill both 

## 2022-06-21 NOTE — Telephone Encounter (Signed)
E-scribed refills.  Plz schedule CPE and lab visits to prevent delays in future refills.

## 2022-06-24 NOTE — Telephone Encounter (Signed)
Patient has been scheduled

## 2022-06-24 NOTE — Telephone Encounter (Signed)
Noted  

## 2022-06-25 DIAGNOSIS — J449 Chronic obstructive pulmonary disease, unspecified: Secondary | ICD-10-CM | POA: Diagnosis not present

## 2022-06-25 DIAGNOSIS — J9611 Chronic respiratory failure with hypoxia: Secondary | ICD-10-CM | POA: Diagnosis not present

## 2022-06-25 DIAGNOSIS — Z7409 Other reduced mobility: Secondary | ICD-10-CM | POA: Diagnosis not present

## 2022-06-27 ENCOUNTER — Telehealth: Payer: Self-pay

## 2022-06-27 NOTE — Telephone Encounter (Signed)
2 pm.  Phone call made to patient to follow up on overall status and offer a home visit.  Patient states overall she is stable.  She has a live-in caregiver that is assisting her with ADL's.  Patient requested discharge from [facility] services at this time.  Advised patient to contact us if we could be of assistance in the future.

## 2022-07-04 ENCOUNTER — Other Ambulatory Visit: Payer: Self-pay | Admitting: Family Medicine

## 2022-07-04 DIAGNOSIS — Z720 Tobacco use: Secondary | ICD-10-CM

## 2022-07-04 DIAGNOSIS — R413 Other amnesia: Secondary | ICD-10-CM

## 2022-07-04 DIAGNOSIS — J9611 Chronic respiratory failure with hypoxia: Secondary | ICD-10-CM

## 2022-07-04 DIAGNOSIS — J449 Chronic obstructive pulmonary disease, unspecified: Secondary | ICD-10-CM | POA: Diagnosis not present

## 2022-07-04 DIAGNOSIS — I5032 Chronic diastolic (congestive) heart failure: Secondary | ICD-10-CM | POA: Diagnosis not present

## 2022-07-04 DIAGNOSIS — F332 Major depressive disorder, recurrent severe without psychotic features: Secondary | ICD-10-CM

## 2022-07-04 NOTE — Progress Notes (Signed)
Pharmacy referral placed.

## 2022-07-05 DIAGNOSIS — Z7409 Other reduced mobility: Secondary | ICD-10-CM | POA: Diagnosis not present

## 2022-07-05 DIAGNOSIS — J449 Chronic obstructive pulmonary disease, unspecified: Secondary | ICD-10-CM | POA: Diagnosis not present

## 2022-07-05 DIAGNOSIS — J9611 Chronic respiratory failure with hypoxia: Secondary | ICD-10-CM | POA: Diagnosis not present

## 2022-07-16 ENCOUNTER — Ambulatory Visit: Payer: 59

## 2022-07-18 ENCOUNTER — Ambulatory Visit: Payer: Medicare Other | Admitting: Pulmonary Disease

## 2022-07-22 ENCOUNTER — Ambulatory Visit (INDEPENDENT_AMBULATORY_CARE_PROVIDER_SITE_OTHER): Payer: 59 | Admitting: Family Medicine

## 2022-07-22 ENCOUNTER — Encounter: Payer: Self-pay | Admitting: Family Medicine

## 2022-07-22 ENCOUNTER — Telehealth: Payer: Self-pay

## 2022-07-22 ENCOUNTER — Other Ambulatory Visit: Payer: Self-pay | Admitting: Pulmonary Disease

## 2022-07-22 VITALS — BP 106/64 | HR 103 | Temp 97.8°F | Ht 65.0 in | Wt 82.0 lb

## 2022-07-22 DIAGNOSIS — J449 Chronic obstructive pulmonary disease, unspecified: Secondary | ICD-10-CM | POA: Diagnosis not present

## 2022-07-22 DIAGNOSIS — E440 Moderate protein-calorie malnutrition: Secondary | ICD-10-CM

## 2022-07-22 DIAGNOSIS — E538 Deficiency of other specified B group vitamins: Secondary | ICD-10-CM

## 2022-07-22 DIAGNOSIS — M7989 Other specified soft tissue disorders: Secondary | ICD-10-CM

## 2022-07-22 DIAGNOSIS — R1031 Right lower quadrant pain: Secondary | ICD-10-CM

## 2022-07-22 DIAGNOSIS — F1090 Alcohol use, unspecified, uncomplicated: Secondary | ICD-10-CM

## 2022-07-22 DIAGNOSIS — Z789 Other specified health status: Secondary | ICD-10-CM | POA: Diagnosis not present

## 2022-07-22 DIAGNOSIS — Z720 Tobacco use: Secondary | ICD-10-CM | POA: Diagnosis not present

## 2022-07-22 DIAGNOSIS — J9611 Chronic respiratory failure with hypoxia: Secondary | ICD-10-CM | POA: Diagnosis not present

## 2022-07-22 DIAGNOSIS — R64 Cachexia: Secondary | ICD-10-CM | POA: Diagnosis not present

## 2022-07-22 DIAGNOSIS — R413 Other amnesia: Secondary | ICD-10-CM

## 2022-07-22 DIAGNOSIS — Z9981 Dependence on supplemental oxygen: Secondary | ICD-10-CM

## 2022-07-22 NOTE — Progress Notes (Signed)
Care Management & Coordination Services Pharmacy Team  Reason for Encounter: Appointment Reminder  Contacted patient to confirm telephone appointment with Charlene Brooke, PharmD on 07/25/2022 at 10:00.  Spoke with patient on 07/22/2022   Do you have any problems getting your medications? No  What is your top health concern you would like to discuss at your upcoming visit? Patient did not have any; stated everything is going good.   Have you seen any other providers since your last visit with PCP? No  Chart review:  Recent office visits:  05/20/22 Telephone Results: "Your pancreas test returned normal as did kidneys and liver. Protein levels were low - continue working on 3 meals a day. White count was slightly elevated. Looks like you missed CT scan appointment - please call and reschedule." 05/15/22 Telephone Stat: Melatonin 5-10 mg for sleep issues 05/14/22 Ria Bush, MD Right lower quadrant abdominal pain  Ordered: CT Scan 01/29/22 Procedure: Prolia injection  Recent consult visits:  None in previous six months  Hospital visits:  None in previous 6 months  Star Rating Drugs:  Medication:  Last Fill: Day Supply None noted  Care Gaps: Annual wellness visit in last year? No  Charlene Brooke, PharmD notified  Marijean Niemann, [place] Clinical Pharmacy Assistant [phone]

## 2022-07-22 NOTE — Patient Instructions (Addendum)
Call [facility] [phone] to schedule CT of abdomen.  Labs today. Pending results we may order leg ultrasound.  Try to get a source of protein with each meal. If you can, 1/2-1 can of ensure /boost 30 min prior to largest meal.  Limit alcohol use.  Return to see me in 6-8 weeks.  Schedule appointment with Dr Patsey Berthold lung doctor.

## 2022-07-22 NOTE — Progress Notes (Unsigned)
Patient ID: Christine Serrano, female    DOB: 1960-12-30, 62 y.o.   MRN: [id number]  This visit was conducted in person.  BP 106/64   Pulse (!) 103   Temp 97.8 F (36.6 C) (Temporal)   Ht '5\' 5"'$  (1.651 m)   Wt 82 lb (37.2 kg)   LMP  (LMP Unknown)   SpO2 93% Comment: 2LNC  BMI 13.65 kg/m    CC: check leg swelling  Subjective:   HPI: Christine Serrano is a 62 y.o. female presenting on 07/22/2022 for Leg Swelling (C/o L leg swelling and numbness. Started about 2 mos ago. Pt accompanied by caregiver, Estill Bamberg. ) Caregiver lives with patient now.    Known severe oxygen dependent COPD, continued smoker.  Not on hospice - she would not have been able to use motorized scooter or portable oxygen concentrator.  Now [facility] involved - personal care aide.   OP - last prolia shot 01/29/2022.   RLQ abdominal pain with palpation present since 11/2021 - never completed recommended CT abd/pelvis.   Weight down 2 lbs. No appetite, no energy, intermittent nausea.   1-2 mo h/o L leg swelling numbness and cold feeling.  No inciting trauma/injury or falls. No back pain or radiculopathy symptoms. No fevers/chills.  Ongoing marked exertional dyspnea.   She does drink significant small flasks of fireball.      Relevant past medical, surgical, family and social history reviewed and updated as indicated. Interim medical history since our last visit reviewed. Allergies and medications reviewed and updated. Outpatient Medications Prior to Visit  Medication Sig Dispense Refill   albuterol (PROVENTIL) (2.5 MG/3ML) 0.083% nebulizer solution Take 3 mLs (2.5 mg total) by nebulization every 6 (six) hours as needed for wheezing or shortness of breath. 150 mL 6   albuterol (VENTOLIN HFA) 108 (90 Base) MCG/ACT inhaler INHALE 2 PUFFS BY MOUTH EVERY 6 HOURS AS NEEDED FOR WHEEZE OR SHORTNESS OF BREATH 8.5 each 3   benzonatate (TESSALON) 100 MG capsule TAKE 1 CAPSULE BY MOUTH THREE TIMES A DAY AS NEEDED FOR  COUGH. 30 capsule 3   BREZTRI AEROSPHERE 160-9-4.8 MCG/ACT AERO INHALE 2 PUFFS INTO THE LUNGS IN THE MORNING AND AT BEDTIME. 10.7 each 12   buPROPion (WELLBUTRIN XL) 150 MG 24 hr tablet Take 1 tablet (150 mg total) by mouth daily. 90 tablet 1   cholecalciferol (VITAMIN D3) 25 MCG (1000 UNIT) tablet Take 1,000 Units by mouth daily.     fluticasone (FLONASE) 50 MCG/ACT nasal spray Place 2 sprays into both nostrils daily. 48 g 3   folic acid (FOLVITE) 1 MG tablet Take 1 tablet (1 mg total) by mouth daily. 90 tablet 1   gabapentin (NEURONTIN) 100 MG capsule TAKE 1 CAPSULE BY MOUTH TWICE  DAILY 200 capsule 1   hydrOXYzine (ATARAX) 25 MG tablet Take 1 tablet (25 mg total) by mouth 2 (two) times daily as needed for anxiety. 60 tablet 6   Iron, Ferrous Sulfate, 325 (65 Fe) MG TABS Take 325 mg by mouth daily. 30 tablet    LINZESS 290 MCG CAPS capsule Take 1 capsule (290 mcg total) by mouth daily as needed (constipation). TAKE 1 CAPSULE EVERY DAY AS NEEDED FOR CONSTIPATION AS DIRECTED 90 capsule 1   meclizine (ANTIVERT) 25 MG tablet Take 1 tablet (25 mg total) by mouth 3 (three) times daily as needed for dizziness. 30 tablet 0   methocarbamol (ROBAXIN) 500 MG tablet TAKE 1 TABLET BY MOUTH 3 TIMES DAILY  AS NEEDED FOR MUSCLE SPASMS (SEDATION PRECAUTIONS). 40 tablet 3   mirtazapine (REMERON) 15 MG tablet TAKE 1 TABLET BY MOUTH EVERYDAY AT BEDTIME 90 tablet 0   Multiple Vitamin (MULTIVITAMIN WITH MINERALS) TABS tablet Take 1 tablet by mouth daily.     naproxen (NAPROSYN) 375 MG tablet TAKE 1 TABLET BY MOUTH TWICE  DAILY AS NEEDED FOR PAIN WITH  FOOD 60 tablet 4   omeprazole (PRILOSEC) 40 MG capsule TAKE 1 CAPSULE BY MOUTH DAILY AS NEEDED FOR GERD 90 capsule 1   OXYGEN Inhale 4 L/min into the lungs continuous.      Potassium 99 MG TABS Take by mouth.     predniSONE (DELTASONE) 5 MG tablet TAKE 2 TABLETS BY MOUTH DAILY WITH BREAKFAST. 60 tablet 0   sertraline (ZOLOFT) 100 MG tablet TAKE 1.5 TABLETS ('150MG'$  TOTAL)  BY MOUTH DAILY 135 tablet 0   thiamine 100 MG tablet Take 1 tablet (100 mg total) by mouth daily. 30 tablet 0   vitamin B-12 (CYANOCOBALAMIN) 1000 MCG tablet Take 1 tablet (1,000 mcg total) by mouth daily.     Ascorbic Acid (VITAMIN C WITH ROSE HIPS) 500 MG tablet Take 500 mg by mouth daily.     aspirin EC 81 MG tablet Take 81 mg by mouth daily.     polyethylene glycol powder (GLYCOLAX/MIRALAX) 17 GM/SCOOP powder Take 17 g by mouth daily as needed for moderate constipation. 3350 g 1   No facility-administered medications prior to visit.     Per HPI unless specifically indicated in ROS section below Review of Systems  Objective:  BP 106/64   Pulse (!) 103   Temp 97.8 F (36.6 C) (Temporal)   Ht '5\' 5"'$  (1.651 m)   Wt 82 lb (37.2 kg)   LMP  (LMP Unknown)   SpO2 93% Comment: 2LNC  BMI 13.65 kg/m   Wt Readings from Last 3 Encounters:  07/22/22 82 lb (37.2 kg)  05/14/22 84 lb (38.1 kg)  01/15/22 86 lb 4 oz (39.1 kg)      Physical Exam Vitals and nursing note reviewed.  Constitutional:      Appearance: Normal appearance. She is not ill-appearing.  HENT:     Head: Normocephalic and atraumatic.     Mouth/Throat:     Mouth: Mucous membranes are moist.     Pharynx: Oropharynx is clear. No oropharyngeal exudate or posterior oropharyngeal erythema.  Eyes:     Extraocular Movements: Extraocular movements intact.     Conjunctiva/sclera: Conjunctivae normal.     Pupils: Pupils are equal, round, and reactive to light.  Cardiovascular:     Rate and Rhythm: Regular rhythm. Tachycardia present.     Pulses: Normal pulses.     Heart sounds: Normal heart sounds. No murmur heard. Pulmonary:     Effort: Pulmonary effort is normal. No respiratory distress.     Breath sounds: Wheezing (mild throughout) and rhonchi (coarse throughout) present. No rales.  Musculoskeletal:     Right lower leg: No edema.     Left lower leg: Edema (1+) present.  Skin:    General: Skin is warm and dry.      Findings: No rash.     Comments:  Neg seated SLR bilaterally No pain with rotation of L femur in femoro-acetabular joint  Neurological:     Mental Status: She is alert.  Psychiatric:        Mood and Affect: Mood normal.        Behavior: Behavior normal.  Results for orders placed or performed in visit on 05/14/22  Comprehensive metabolic panel  Result Value Ref Range   Sodium 142 135 - 145 mEq/L   Potassium 3.9 3.5 - 5.1 mEq/L   Chloride 91 (L) 96 - 112 mEq/L   CO2 41 (H) 19 - 32 mEq/L   Glucose, Bld 84 70 - 99 mg/dL   BUN 8 6 - 23 mg/dL   Creatinine, Ser 0.42 0.40 - 1.20 mg/dL   Total Bilirubin 0.3 0.2 - 1.2 mg/dL   Alkaline Phosphatase 116 39 - 117 U/L   AST 23 0 - 37 U/L   ALT 12 0 - 35 U/L   Total Protein 5.4 (L) 6.0 - 8.3 g/dL   Albumin 3.1 (L) 3.5 - 5.2 g/dL   GFR 104.14 >60.00 mL/min   Calcium 8.8 8.4 - 10.5 mg/dL  CBC with Differential/Platelet  Result Value Ref Range   WBC 11.0 (H) 4.0 - 10.5 K/uL   RBC 4.01 3.87 - 5.11 Mil/uL   Hemoglobin 11.9 (L) 12.0 - 15.0 g/dL   HCT 36.4 36.0 - 46.0 %   MCV 90.8 78.0 - 100.0 fl   MCHC 32.7 30.0 - 36.0 g/dL   RDW 13.5 11.5 - 15.5 %   Platelets 245.0 150.0 - 400.0 K/uL   Neutrophils Relative % 62.5 43.0 - 77.0 %   Lymphocytes Relative 26.9 12.0 - 46.0 %   Monocytes Relative 8.8 3.0 - 12.0 %   Eosinophils Relative 1.0 0.0 - 5.0 %   Basophils Relative 0.8 0.0 - 3.0 %   Neutro Abs 6.8 1.4 - 7.7 K/uL   Lymphs Abs 2.9 0.7 - 4.0 K/uL   Monocytes Absolute 1.0 0.1 - 1.0 K/uL   Eosinophils Absolute 0.1 0.0 - 0.7 K/uL   Basophils Absolute 0.1 0.0 - 0.1 K/uL  Lipase  Result Value Ref Range   Lipase 22.0 11.0 - 59.0 U/L    Assessment & Plan:   Problem List Items Addressed This Visit     COPD, very severe ([place]) - Primary   Folate deficiency   Relevant Orders   Folate   Low serum vitamin B12   Relevant Orders   Vitamin B12   Other Visit Diagnoses     Left leg swelling       Relevant Orders   D-dimer,  quantitative   Comprehensive metabolic panel   TSH   CBC with Differential/Platelet   Alcohol use       Relevant Orders   Vitamin B1        No orders of the defined types were placed in this encounter.   Orders Placed This Encounter  Procedures   D-dimer, quantitative   Comprehensive metabolic panel   TSH   CBC with Differential/Platelet   Vitamin B12   Folate   Vitamin B1    Patient Instructions  Call [facility]) [phone] to schedule CT of abdomen.  Labs today. Pending results we may order leg ultrasound.  Try to get a source of protein with each meal. If you can, 1/2-1 can of ensure /boost 30 min prior to largest meal.  Limit alcohol use.  Return to see me in 6-8 weeks.  Schedule appointment with Dr Patsey Berthold lung doctor.   Follow up plan: Return in about 6 weeks (around 09/02/2022), or if symptoms worsen or fail to improve, for follow up visit.  Ria Bush, MD

## 2022-07-23 ENCOUNTER — Encounter: Payer: Self-pay | Admitting: Family Medicine

## 2022-07-23 DIAGNOSIS — M7989 Other specified soft tissue disorders: Secondary | ICD-10-CM | POA: Insufficient documentation

## 2022-07-23 LAB — D-DIMER, QUANTITATIVE: D-Dimer, Quant: 0.43 mcg/mL FEU (ref <0.50)

## 2022-07-23 LAB — FOLATE: Folate: >23.8 ng/mL (ref >5.9)

## 2022-07-23 LAB — COMPREHENSIVE METABOLIC PANEL
ALT: 16 U/L (ref 0–35)
AST: 27 U/L (ref 0–37)
Albumin: 2.8 g/dL — ABNORMAL LOW (ref 3.5–5.2)
Alkaline Phosphatase: 117 U/L (ref 39–117)
BUN: 8 mg/dL (ref 6–23)
CO2: 30 mEq/L (ref 19–32)
Calcium: 8.5 mg/dL (ref 8.4–10.5)
Chloride: 95 mEq/L — ABNORMAL LOW (ref 96–112)
Creatinine, Ser: 0.42 mg/dL (ref 0.40–1.20)
GFR: 104.01 mL/min (ref >60.00)
Glucose, Bld: 115 mg/dL — ABNORMAL HIGH (ref 70–99)
Potassium: 3.8 mEq/L (ref 3.5–5.1)
Sodium: 140 mEq/L (ref 135–145)
Total Bilirubin: 0.4 mg/dL (ref 0.2–1.2)
Total Protein: 4.9 g/dL — ABNORMAL LOW (ref 6.0–8.3)

## 2022-07-23 LAB — TSH: TSH: 1.45 u[IU]/mL (ref 0.35–5.50)

## 2022-07-23 LAB — CBC WITH DIFFERENTIAL/PLATELET
Basophils Absolute: 0 10*3/uL (ref 0.0–0.1)
Basophils Relative: 0.5 % (ref 0.0–3.0)
Eosinophils Absolute: 0 10*3/uL (ref 0.0–0.7)
Eosinophils Relative: 0.3 % (ref 0.0–5.0)
HCT: 30.3 % — ABNORMAL LOW (ref 36.0–46.0)
Hemoglobin: 10.1 g/dL — ABNORMAL LOW (ref 12.0–15.0)
Lymphocytes Relative: 11.6 % — ABNORMAL LOW (ref 12.0–46.0)
Lymphs Abs: 1.1 10*3/uL (ref 0.7–4.0)
MCHC: 33.3 g/dL (ref 30.0–36.0)
MCV: 90.2 fl (ref 78.0–100.0)
Monocytes Absolute: 0.2 10*3/uL (ref 0.1–1.0)
Monocytes Relative: 2.2 % — ABNORMAL LOW (ref 3.0–12.0)
Neutro Abs: 8.3 10*3/uL — ABNORMAL HIGH (ref 1.4–7.7)
Neutrophils Relative %: 85.4 % — ABNORMAL HIGH (ref 43.0–77.0)
Platelets: 277 10*3/uL (ref 150.0–400.0)
RBC: 3.36 Mil/uL — ABNORMAL LOW (ref 3.87–5.11)
RDW: 14.2 % (ref 11.5–15.5)
WBC: 9.8 10*3/uL (ref 4.0–10.5)

## 2022-07-23 LAB — VITAMIN B12: Vitamin B-12: 1038 pg/mL — ABNORMAL HIGH (ref 211–911)

## 2022-07-23 NOTE — Assessment & Plan Note (Signed)
Severe oxygen steroid dependent emphysema - overdue for pulm f/u she will call and schedule.  Continued daily breztri with albuterol PRN.

## 2022-07-23 NOTE — Assessment & Plan Note (Signed)
Mild LLE edema of unclear cause.  Pending CT abd/pelvis for chronic RLQ abd discomfort.  Will check labwork today including D dimer - discussed need for venous US if elevated.  Rec leg elevation at this time. Discussed relation of malnourishment on peripheral edema - rec source of protein with each meal.

## 2022-07-23 NOTE — Assessment & Plan Note (Signed)
H/o alcohol abuse, states now currently drinking small flasks of fireball throughout the day, unable to quantify amount.  Check labwork today, encourage alcohol cessation.

## 2022-07-23 NOTE — Assessment & Plan Note (Signed)
She will call to schedule abdominal /pelvic CT.

## 2022-07-23 NOTE — Assessment & Plan Note (Addendum)
Continues home O2 at 4L [place] - discussed limiting oxygen use to maintain O2 sats >90%.  In office today maintaining saturation with 2LNC via portable oxygen concentrator.

## 2022-07-23 NOTE — Assessment & Plan Note (Signed)
Check labwork, consider updated MMSE next visit.

## 2022-07-23 NOTE — Assessment & Plan Note (Signed)
Discussed this, encouraged working on protein in diet

## 2022-07-24 ENCOUNTER — Other Ambulatory Visit (HOSPITAL_COMMUNITY): Payer: Self-pay

## 2022-07-24 ENCOUNTER — Telehealth: Payer: Self-pay

## 2022-07-24 NOTE — Telephone Encounter (Signed)
Pt ready for scheduling on or after 08/02/22  Out-of-pocket cost due at time of visit: $327  Primary: [employer] - Medicare Prolia co-insurance: 20% (approximately $302) Admin fee co-insurance: 20% (approximately $25)  Deductible: $0  Secondary:  Prolia co-insurance:  Admin fee co-insurance:   Deductible:   Prior Auth: approved PA# [id number] Valid: 07/24/22 to 07/25/23  ** This summary of benefits is an estimation of the patient's out-of-pocket cost. Exact cost may vary based on individual plan coverage.

## 2022-07-24 NOTE — Telephone Encounter (Signed)
Patient Teacher, English as a foreign language completed.    The current 180 day co-pay is, $0 (through pharmacy benefit).   The patient is insured through [employer]

## 2022-07-24 NOTE — Telephone Encounter (Signed)
Please start new PA for Prolia.  Patient is due next inj on or after 08/02/22.  Thank you

## 2022-07-25 ENCOUNTER — Ambulatory Visit: Payer: 59 | Admitting: Pharmacist

## 2022-07-25 ENCOUNTER — Other Ambulatory Visit: Payer: Self-pay

## 2022-07-25 DIAGNOSIS — M810 Age-related osteoporosis without current pathological fracture: Secondary | ICD-10-CM

## 2022-07-25 MED ORDER — DENOSUMAB 60 MG/ML ~~LOC~~ SOSY: 60.0000 mg | PREFILLED_SYRINGE | Freq: Once | SUBCUTANEOUS | 0 refills | Status: AC

## 2022-07-25 NOTE — Telephone Encounter (Signed)
Prolia inj sch for 08/06/22.  Labs from 07/22/22 ok for Prolia inj.  Patient will bring inj from pharmacy.

## 2022-07-25 NOTE — Patient Instructions (Signed)
Visit Information  Phone number for Pharmacist: [phone]  Thank you for meeting with me to discuss your medications! Below is a summary of what we talked about during the visit:   Recommendations/Changes made from today's visit: -Discussed fall risk at length; avoid sedating medications early in the day, and avoid mixing them with alcohol -Consider PT evaluation for balance - consulting with PCP  Follow up plan: -Pharmacist follow up televisit scheduled for 3 months -PCP appt 08/16/22 (CPE)   Charlene Brooke, PharmD, BCACP Clinical Pharmacist [facility] [phone]

## 2022-07-25 NOTE — Progress Notes (Signed)
Care Management & Coordination Services Pharmacy Note  07/25/2022 Name:  Christine Serrano MRN:  [id number] DOB:  Mar 06, 1961  Summary: Initial visit -Reviewed medications; pt affirms compliance as prescribed; she denies issues/side effects -Pt reports falls at home, becoming more frequent over the past ~2 months; she cannot say what causes the falls, possibly vertigo; she denies head injuries, she usually falls on her bottom and her CG or roommate will help her up; -Pt reports daytime sleepiness (naps for a few hours midday) despite sleeping >12 hrs at night (~8pm-9am); discussed sedating medications taken in early in the day can contribute to this (gabapentin, methocarbamol, hydroxyzine, meclizine)  Recommendations/Changes made from today's visit: -Discussed fall risk at length; avoid sedating medications early in the day, and avoid mixing them with alcohol -Consider PT evaluation for balance - consulting with PCP  Follow up plan: -Pharmacist follow up televisit scheduled for 3 months -PCP appt 08/16/22 (CPE)    Subjective: Christine Serrano is an 62 y.o. year old female who is a primary patient of Ria Bush, MD.  The care coordination team was consulted for assistance with disease management and care coordination needs.    Engaged with patient by telephone for initial visit. Patient lives with roommate/partner Christine Serrano and has a caregiver daily Estill Bamberg). Not on hospice - she would not have been able to use motorized scooter or portable oxygen concentrator.    Recent office visits: 07/22/22 Dr Danise Mina OV: f/u - leg swelling x 2 mos, abd pain. Schedule CT, labs. Increase protein in diet, Boost. Limit alcohol. Schedule pulm appt.  05/14/22 Dr Danise Mina OV: abd pain - ordered CT. Lipase wnl.   01/15/22 Dr Danise Mina OV: hospital f/u (COPD exacerbation) - advanced directive discussion, wants DNR, she was asked to bring copy.  08/31/21 Dr Danise Mina OV: f/u - referred to hospice for end-stage  COPD. Increase mirtazapine to 15 mg to reduce hypnotic effect.  Recent consult visits: 08/28/21 Dr Patsey Berthold (Pulmonary): COPD, in exacerbation. Rx Augmentin, prednisone, Discussed hospice. Discussed manage panic attacks with PCP.   Hospital visits: 12/24/21 ED visit [facility]): COPD exacerbation. Given Solu-medrol, albuterol nebulizer.   Objective:  Lab Results  Component Value Date   CREATININE 0.42 07/22/2022   BUN 8 07/22/2022   GFR 104.01 07/22/2022   GFRNONAA >60 12/24/2021   GFRAA >60 01/10/2020   NA 140 07/22/2022   K 3.8 07/22/2022   CALCIUM 8.5 07/22/2022   CO2 30 07/22/2022   GLUCOSE 115 (H) 07/22/2022    Lab Results  Component Value Date/Time   GFR 104.01 07/22/2022 03:58 PM   GFR 104.14 05/14/2022 04:10 PM    Last diabetic Eye exam: No results found for: "HMDIABEYEEXA"  Last diabetic Foot exam: No results found for: "HMDIABFOOTEX"   Lab Results  Component Value Date   CHOL 182 06/25/2021   HDL 55.30 06/25/2021   LDLCALC 97 06/25/2021   TRIG 152.0 (H) 06/25/2021   CHOLHDL 3 06/25/2021       Latest Ref Rng & Units 07/22/2022    3:58 PM 05/14/2022    4:10 PM 01/15/2022    1:19 PM  Hepatic Function  Total Protein 6.0 - 8.3 g/dL 4.9  5.4  5.3   Albumin 3.5 - 5.2 g/dL 2.8  3.1  3.0   AST 0 - 37 U/L 27  23  23   $ ALT 0 - 35 U/L 16  12  12   $ Alk Phosphatase 39 - 117 U/L 117  116  135   Total Bilirubin  0.2 - 1.2 mg/dL 0.4  0.3  0.3     Lab Results  Component Value Date/Time   TSH 1.45 07/22/2022 03:58 PM   TSH 4.22 09/26/2020 10:10 AM   FREET4 0.86 10/25/2016 11:09 AM       Latest Ref Rng & Units 07/22/2022    3:58 PM 05/14/2022    4:10 PM 12/24/2021    5:16 PM  CBC  WBC 4.0 - 10.5 K/uL 9.8  11.0  5.5   Hemoglobin 12.0 - 15.0 g/dL 10.1  11.9  11.8   Hematocrit 36.0 - 46.0 % 30.3  36.4  36.3   Platelets 150.0 - 400.0 K/uL 277.0  245.0  197     Lab Results  Component Value Date/Time   VD25OH 58.47 06/25/2021 11:49 AM   VD25OH 65.62 05/31/2020 10:07 AM    VITAMINB12 1,038 (H) 07/22/2022 03:58 PM   VITAMINB12 263 06/25/2021 11:49 AM    Clinical ASCVD: Yes  - aortic atherosclerosis The 10-year ASCVD risk score (Arnett DK, et al., 2019) is: 5.7%   Values used to calculate the score:     Age: 66 years     Sex: Female     Is Non-Hispanic African American: No     Diabetic: No     Tobacco smoker: Yes     Systolic Blood Pressure: [id number] mmHg     Is BP treated: No     HDL Cholesterol: 55.3 mg/dL     Total Cholesterol: 182 mg/dL       07/22/2022    3:54 PM 06/06/2021    9:52 AM 05/26/2020    8:24 AM  Depression screen PHQ 2/9  Decreased Interest 1 0 3  Down, Depressed, Hopeless 3 0 3  PHQ - 2 Score 4 0 6  Altered sleeping 1  3  Tired, decreased energy   3  Change in appetite 3  0  Feeling bad or failure about yourself  1  0  Trouble concentrating 0  0  Moving slowly or fidgety/restless 0  0  Suicidal thoughts 0  0  PHQ-9 Score 9  12  Difficult doing work/chores Extremely dIfficult  Somewhat difficult     Social History   Tobacco Use  Smoking Status Every Day   Packs/day: 1.00   Years: 51.00   Total pack years: 51.00   Types: Cigarettes  Smokeless Tobacco Never  Tobacco Comments   Less than 1ppd 08/28/2021   BP Readings from Last 3 Encounters:  07/22/22 106/64  05/14/22 100/60  01/15/22 108/78   Pulse Readings from Last 3 Encounters:  07/22/22 (!) 103  05/14/22 90  01/15/22 (!) 118   Wt Readings from Last 3 Encounters:  07/22/22 82 lb (37.2 kg)  05/14/22 84 lb (38.1 kg)  01/15/22 86 lb 4 oz (39.1 kg)   BMI Readings from Last 3 Encounters:  07/22/22 13.65 kg/m  05/14/22 13.98 kg/m  01/15/22 14.35 kg/m    Allergies  Allergen Reactions   Terbinafine And Related Diarrhea    Diarrheal illness while on terbinafine ?related to med   Clarithromycin     Caused Thrush    Imitrex [Sumatriptan] Other (See Comments)    Worsens migraine     Medications Reviewed Today     Reviewed by Charlton Haws, [facility]  (Pharmacist) on 07/25/22 at 1042  Med List Status: <None>   Medication Order Taking? Sig Documenting Provider Last Dose Status Informant  albuterol (PROVENTIL) (2.5 MG/3ML) 0.083% nebulizer solution [id number] Yes Take 3  mLs (2.5 mg total) by nebulization every 6 (six) hours as needed for wheezing or shortness of breath. Ria Bush, MD Taking Active   albuterol (VENTOLIN HFA) 108 [phone] Base) MCG/ACT inhaler [id number] Yes INHALE 2 PUFFS BY MOUTH EVERY 6 HOURS AS NEEDED FOR WHEEZE OR SHORTNESS OF Alvester Morin, MD Taking Active   benzonatate (TESSALON) 100 MG capsule [id number] Yes TAKE 1 CAPSULE BY MOUTH THREE TIMES A DAY AS NEEDED FOR COUGH. Ria Bush, MD Taking Active   BREZTRI AEROSPHERE 160-9-4.8 MCG/ACT Hollie Salk [id number] Yes INHALE 2 PUFFS INTO THE LUNGS IN THE MORNING AND AT BEDTIME. Tyler Pita, MD Taking Active   buPROPion (WELLBUTRIN XL) 150 MG 24 hr tablet [id number] Yes Take 1 tablet (150 mg total) by mouth daily. Ria Bush, MD Taking Active   cholecalciferol (VITAMIN D3) 25 MCG (1000 UNIT) tablet [id number] Yes Take 1,000 Units by mouth daily. [provider] Taking Active Self  fluticasone (FLONASE) 50 MCG/ACT nasal spray [id number] Yes Place 2 sprays into both nostrils daily. Ria Bush, MD Taking Active   folic acid (FOLVITE) 1 MG tablet [id number] Yes Take 1 tablet (1 mg total) by mouth daily. Ria Bush, MD Taking Active   gabapentin (NEURONTIN) 100 MG capsule [id number] Yes TAKE 1 CAPSULE BY MOUTH TWICE  DAILY Ria Bush, MD Taking Active   hydrOXYzine (ATARAX) 25 MG tablet [id number] Yes Take 1 tablet (25 mg total) by mouth 2 (two) times daily as needed for anxiety. Ria Bush, MD Taking Active   Iron, Ferrous Sulfate, 325 (65 Fe) MG TABS [id number] Yes Take 325 mg by mouth daily. Ria Bush, MD Taking Active   LINZESS 290 MCG CAPS capsule [id number] Yes Take 1 capsule (290 mcg total) by mouth daily as needed  (constipation). TAKE 1 CAPSULE EVERY DAY AS NEEDED FOR CONSTIPATION AS DIRECTED Ria Bush, MD Taking Active   meclizine (ANTIVERT) 25 MG tablet [id number] Yes Take 1 tablet (25 mg total) by mouth 3 (three) times daily as needed for dizziness. Ria Bush, MD Taking Active   methocarbamol (ROBAXIN) 500 MG tablet [id number] Yes TAKE 1 TABLET BY MOUTH 3 TIMES DAILY AS NEEDED FOR MUSCLE SPASMS (SEDATION PRECAUTIONS). Ria Bush, MD Taking Active   mirtazapine (REMERON) 15 MG tablet [id number] Yes TAKE 1 TABLET BY MOUTH EVERYDAY AT BEDTIME Ria Bush, MD Taking Active   Multiple Vitamin (MULTIVITAMIN WITH MINERALS) TABS tablet [id number] Yes Take 1 tablet by mouth daily. Aline August, MD Taking Active Self  naproxen (NAPROSYN) 375 MG tablet [id number] Yes TAKE 1 TABLET BY MOUTH TWICE  DAILY AS NEEDED FOR PAIN WITH  FOOD Ria Bush, MD Taking Active   omeprazole (PRILOSEC) 40 MG capsule [id number] Yes TAKE 1 CAPSULE BY MOUTH DAILY AS NEEDED FOR GERD Ria Bush, MD Taking Active   OXYGEN [id number] Yes Inhale 4 L/min into the lungs continuous.  [provider] Taking Active Self  Potassium 99 MG TABS [id number] Yes Take by mouth. [provider] Taking Active   predniSONE (DELTASONE) 5 MG tablet [id number] Yes TAKE 2 TABLETS BY MOUTH DAILY WITH BREAKFAST. Tyler Pita, MD Taking Active   sertraline (ZOLOFT) 100 MG tablet [id number] Yes TAKE 1.5 TABLETS (150MG TOTAL) BY MOUTH DAILY Ria Bush, MD Taking Active   thiamine 100 MG tablet [id number] Yes Take 1 tablet (100 mg total) by mouth daily. Aline August, MD Taking Active Self  vitamin B-12 (CYANOCOBALAMIN) 1000 MCG tablet [id number] Yes Take 1 tablet (1,000 mcg total) by mouth daily. Ria Bush, MD Taking Active  SDOH:  (Social Determinants of Health) assessments and interventions performed: Yes SDOH Interventions    Flowsheet Row Care Coordination from 07/25/2022 in  [place] from 05/26/2020 in [place] at [facility] Visit from 05/26/2019 in [place] [facility] from 07/20/2018 in [facility] Card[facility] Pulmonary Rehab Pulmonary Rehab from 03/09/2018 in [facility] Cardiac and Pulmonary Rehab Clinical Support from 02/06/2018 in [place] at [place] Interventions Intervention Not Indicated -- -- -- -- --  Transportation Interventions Intervention Not Indicated -- -- -- -- --  Depression Interventions/Treatment  -- Currently on Treatment Medication, Currently on Treatment Currently on Treatment, Counseling, Medication Counseling, Currently on Treatment  [sees a therapist at [facility]] Medication  [referral to PCP]       Medication Assistance: None required.  Patient affirms current coverage meets needs.  Medication Access: Within the past 30 days, how often has patient missed a dose of medication? 0 Is a pillbox or other method used to improve adherence? Yes  Factors that may affect medication adherence? no barriers identified Are meds synced by current pharmacy? No  Are meds delivered by current pharmacy? No  Does patient experience delays in picking up medications due to transportation concerns? No   [facility] Services Reviewed: Is patient disadvantaged to use [facility]?: Yes  Current Rx insurance plan: UHC dual complete Name and location of [facility]  [id number] [place]Phone: [phone]: [phone] [place] [address]Phone: [phone]: [phone] [place] [address] [address]Phone: [phone]:  [phone] [facility] services reviewed with patient today?: No  Patient requests to transfer care to [facility]?: No  Reason patient declined to change pharmacies: Disadvantaged due to insurance/mail order  Compliance/Adherence/Medication fill history: Care Gaps: Mammogram (due 05/2019)  Star-Rating Drugs: None   Assessment/Plan  COPD (Goal: control symptoms and prevent exacerbations) -Stable - pt reports SPO2 93%; on oxygen 24/7 -end-stage COPD, has been considered for hospice but ultimately declined; -Gold Grade: Gold 3 (FEV1 30-49%) -Current COPD Classification:  E (exacerbation leading to hospitalization OR 2+ moderate exacerbations) -Pulmonary function testing: 01/2018 -  FEV1: 0.76 L (31 %pred), FEV1/FVC: 53%  -Exacerbations requiring treatment in last 6 months: 0 -Current treatment  Albuterol HFA PRN every 1-2 hrs - Appropriate, Effective, Safe, Accessible Albuterol neb TID -Appropriate, Effective, Safe, Accessible Breztri 160-9-4.8 mcg/act 2 puff BID -Appropriate, Effective, Safe, Accessible Prednisone 5 mg -2 tab daily - Appropriate, Effective, Safe, Accessible Oxygen 2-4 L 24/7 -Medications previously tried: n/a -Patient reports consistent use of maintenance inhaler -Frequency of rescue inhaler use: every 1-2 hours -Counseled on Benefits of consistent maintenance inhaler use -Recommended to continue current medication  Tobacco use (Goal: reduction/cessation) -Uncontrolled - pt smoking 1/2-1 ppd currently -Previous quit attempts: unknown - reports MOTIVATION to quit is low -reports CONFIDENCE in quitting is very low -Encouraged reduction in cigarettes, pt is not particularly motivated to quit  Depression/Anxiety (Goal: manage mood) -Controlled - per pt report, mood is stable; pt does drink alcohol (fireball) every day and has been encouraged to limit alcohol use -PHQ9: 9 (07/2022) - mild depression -GAD7: 1 (07/2022) -  minimal anxiety -Connected  with PCP for mental health support -Current treatment: Bupropion XL 150 mg daily - Appropriate, Effective, Safe, Accessible Mirtazapine 15 mg HS - Appropriate, Effective, Safe, Accessible Sertraline 100 mg - 1.5 tab daily - Appropriate, Effective, Safe, Accessible Hydroxyzine 25 mg BID prn - takes AM - Appropriate, Effective, Safe, Accessible -Medications previously tried/failed: trazodone -Educated on Benefits of medication for symptom control -Recommended to continue current medication  Osteoporosis (Goal prevent fractures) -Not ideally controlled - pt reports frequent falls at home recently over the past 2 months; she does have a scooter to get around outside but does not use any assistance to get around the house -Last DEXA Scan: 05/2018   T-Score total hip: -3.5  T-Score lumbar spine: -2.6 -Patient is a candidate for pharmacologic treatment due to T-Score < -2.5 in total hip  and T-Score < -2.5 in lumbar spine -Current treatment  Prolia (05/2018 - 01/2022) - Appropriate, Effective, Safe, Accessible Vitamin D 1000 IU -Medications previously tried: n/a  -Recommend [phone] units of vitamin D daily. -Discussed fall risk at length; discussed sedating medications that can contribute to falls (gabapentin, methocarbamol, meclizine, hydroxyzine)- advised to avoid combining these with alcohol; advised to consider walking assistance around the home (ie, cane) -Recommended to continue current medication  Hyperlipidemia: (LDL goal < 70) -Adequate control - LDL 97 (06/2021), given end-stage COPD, palliative/hospice status, will not pursue additional statin therapy -Hx aortic atherosclerosis, vertebral artery stenosis  GERD (Goal: mange sx) -Controlled - takes 2-3x per week -Current treatment  Omeprazole 40 mg PRN - Appropriate, Effective, Safe, Accessible -Medications previously tried: n/a  -Recommended to continue current medication  Constipation  (Goal: regular BM) -Controlled - takes  once a week, reports regular BM for the most part -Current treatment  Linzess 290 mcg daily PRN - Appropriate, Effective, Safe, Accessible -Medications previously tried: n/a  -Recommended to continue current medication  Pain (Goal: manage sx) -Controlled - per pt report -Hx cervical neck pain -Current treatment  Gabapentin 100 mg BID PRN - Appropriate, Effective, Query Safe Methocarbamol 500 mg BID PRN -Appropriate, Effective, Query Safe Naproxen 500 mg BID PRN -Medications previously tried: n/a -Reviewed sedation risks with gabapentin, methocarbamol; discussed fall risk at length as above -Recommended to continue current medication; limit daytime use of sedating medications  Health Maintenance -Vaccine gaps: Shingrix #2, RSV, Covid -OTC: MVI, potassium 99, thiamine, folic acid, iron, [id number] -Vertigo: has meclizine PRN -Hx alcohol abuse, drinking flask with fireball daily -Daytime sleepiness: Pt reports sleeping a lot more lately in the daytime; she goes to sleep around 8pm - 9am; takes 1 nap a day for a couple hours; discussed taking sedating medications early in the day may be contributing, advised to limit these if she can -Weight loss/malnutrition: Pt is drinking Boost, trying to eat better   Charlene Brooke, PharmD, BCACP Clinical Pharmacist [facility] [phone]

## 2022-07-26 LAB — VITAMIN B1: Vitamin B1 (Thiamine): <6 nmol/L — ABNORMAL LOW (ref 8–30)

## 2022-07-29 ENCOUNTER — Other Ambulatory Visit: Payer: Self-pay | Admitting: Family Medicine

## 2022-07-29 DIAGNOSIS — R0609 Other forms of dyspnea: Secondary | ICD-10-CM

## 2022-07-29 DIAGNOSIS — J449 Chronic obstructive pulmonary disease, unspecified: Secondary | ICD-10-CM

## 2022-07-29 DIAGNOSIS — R42 Dizziness and giddiness: Secondary | ICD-10-CM

## 2022-07-29 DIAGNOSIS — Z7409 Other reduced mobility: Secondary | ICD-10-CM

## 2022-07-29 DIAGNOSIS — E440 Moderate protein-calorie malnutrition: Secondary | ICD-10-CM

## 2022-07-29 DIAGNOSIS — J9611 Chronic respiratory failure with hypoxia: Secondary | ICD-10-CM

## 2022-07-29 NOTE — Addendum Note (Signed)
Addended by: Ria Bush on: 07/29/2022 08:13 AM   Modules accepted: Orders

## 2022-08-01 ENCOUNTER — Other Ambulatory Visit: Payer: 59

## 2022-08-01 ENCOUNTER — Telehealth: Payer: Self-pay | Admitting: Family Medicine

## 2022-08-01 NOTE — Telephone Encounter (Signed)
Christine Serrano from house calls performed a mini coug exam and her clock was at normal and she was unable to recall 3 out of the 3 items.

## 2022-08-02 ENCOUNTER — Telehealth: Payer: Self-pay | Admitting: Family Medicine

## 2022-08-02 NOTE — Telephone Encounter (Addendum)
Can we call pt to notify of below, and find what best # to reach her at is?  Also, she missed scheduled CT scan yesterday - please have her assistant Estill Bamberg help her by calling to reschedule.

## 2022-08-02 NOTE — Telephone Encounter (Signed)
Noted  

## 2022-08-02 NOTE — Telephone Encounter (Signed)
Spoke with pt notifying her of message from [facility]. Pt states phn #:  [phone] is the best # to reach her. I relayed Dr. Synthia Innocent message about missed CT scan. Pt states they will call and r/s.

## 2022-08-02 NOTE — Telephone Encounter (Signed)
Tillie Rung from [place] called over and stated that there will be a delay in start of care for Christine Serrano. They are not able to contact her and have tried multiple times. Thank you!

## 2022-08-04 DIAGNOSIS — M810 Age-related osteoporosis without current pathological fracture: Secondary | ICD-10-CM | POA: Diagnosis not present

## 2022-08-04 DIAGNOSIS — E440 Moderate protein-calorie malnutrition: Secondary | ICD-10-CM | POA: Diagnosis not present

## 2022-08-04 DIAGNOSIS — I959 Hypotension, unspecified: Secondary | ICD-10-CM | POA: Diagnosis not present

## 2022-08-04 DIAGNOSIS — F32A Depression, unspecified: Secondary | ICD-10-CM | POA: Diagnosis not present

## 2022-08-04 DIAGNOSIS — Z7952 Long term (current) use of systemic steroids: Secondary | ICD-10-CM | POA: Diagnosis not present

## 2022-08-04 DIAGNOSIS — E538 Deficiency of other specified B group vitamins: Secondary | ICD-10-CM | POA: Diagnosis not present

## 2022-08-04 DIAGNOSIS — I700 Atherosclerosis of aorta: Secondary | ICD-10-CM | POA: Diagnosis not present

## 2022-08-04 DIAGNOSIS — M1990 Unspecified osteoarthritis, unspecified site: Secondary | ICD-10-CM | POA: Diagnosis not present

## 2022-08-04 DIAGNOSIS — M7989 Other specified soft tissue disorders: Secondary | ICD-10-CM | POA: Diagnosis not present

## 2022-08-04 DIAGNOSIS — K219 Gastro-esophageal reflux disease without esophagitis: Secondary | ICD-10-CM | POA: Diagnosis not present

## 2022-08-04 DIAGNOSIS — E785 Hyperlipidemia, unspecified: Secondary | ICD-10-CM | POA: Diagnosis not present

## 2022-08-04 DIAGNOSIS — I10 Essential (primary) hypertension: Secondary | ICD-10-CM | POA: Diagnosis not present

## 2022-08-04 DIAGNOSIS — J9611 Chronic respiratory failure with hypoxia: Secondary | ICD-10-CM | POA: Diagnosis not present

## 2022-08-04 DIAGNOSIS — J449 Chronic obstructive pulmonary disease, unspecified: Secondary | ICD-10-CM | POA: Diagnosis not present

## 2022-08-04 DIAGNOSIS — Z9981 Dependence on supplemental oxygen: Secondary | ICD-10-CM | POA: Diagnosis not present

## 2022-08-04 DIAGNOSIS — J439 Emphysema, unspecified: Secondary | ICD-10-CM | POA: Diagnosis not present

## 2022-08-04 DIAGNOSIS — D509 Iron deficiency anemia, unspecified: Secondary | ICD-10-CM | POA: Diagnosis not present

## 2022-08-04 DIAGNOSIS — F17210 Nicotine dependence, cigarettes, uncomplicated: Secondary | ICD-10-CM | POA: Diagnosis not present

## 2022-08-04 DIAGNOSIS — I5032 Chronic diastolic (congestive) heart failure: Secondary | ICD-10-CM | POA: Diagnosis not present

## 2022-08-05 DIAGNOSIS — J9611 Chronic respiratory failure with hypoxia: Secondary | ICD-10-CM | POA: Diagnosis not present

## 2022-08-05 DIAGNOSIS — Z7409 Other reduced mobility: Secondary | ICD-10-CM | POA: Diagnosis not present

## 2022-08-05 DIAGNOSIS — J449 Chronic obstructive pulmonary disease, unspecified: Secondary | ICD-10-CM | POA: Diagnosis not present

## 2022-08-06 ENCOUNTER — Ambulatory Visit: Payer: 59

## 2022-08-06 NOTE — Telephone Encounter (Signed)
Left vmail for patient to call me back to resch Prolia inj.  Patient has an appt on 08/16/22 with Dr Darnell Level for her CPX appt.  She can use that same appt for her Prolia inj if that's ok with her.  Labs were drawn on 07/22/2022, still ok for Prolia inj until 08/20/2022.

## 2022-08-07 ENCOUNTER — Telehealth: Payer: Self-pay | Admitting: Family Medicine

## 2022-08-07 MED ORDER — ALBUTEROL SULFATE HFA 108 (90 BASE) MCG/ACT IN AERS: INHALATION_SPRAY | RESPIRATORY_TRACT | 3 refills | Status: DC

## 2022-08-07 MED ORDER — ALBUTEROL SULFATE HFA 108 (90 BASE) MCG/ACT IN AERS: INHALATION_SPRAY | RESPIRATORY_TRACT | 1 refills | Status: DC

## 2022-08-07 NOTE — Telephone Encounter (Signed)
Spoke to patient by telephone and was advised that [facility]/21/22 was transferred to [facility]. Patient stated that she told [facility] when they called her she did not want them as her pharmacy. Patient stated that she has not gotten any medication from that pharmacy and does not want to use them. Patient is going to call [facility] and tell them to stop requesting [facility]. Called and spoke to [place] [facility] and was advised that [facility] is a Patent attorney and does not recommend them to anyone. Lennette Bihari stated when he was not there a float did transfer her prescriptions to [facility]. Lennette Bihari stated that he has been on the phone with [facility] for an hour trying to get these scripts back from them. Lennette Bihari stated that he is working on trying to find out who to report this company to. Lennette Bihari requested that a script for patient's Albuterol inhaler be sent to him. [facility].  Called patient and advised her of my conversation with [facility]. Patient stated that she is on hold with [facility] and plans on telling them to stop requesting medications for her and to cancel any orders that they may have. Patient has an upcoming appointment scheduled with Dr. Danise Mina 08/16/22.

## 2022-08-07 NOTE — Telephone Encounter (Signed)
Spoke to patient about Prolia inj.  Inj to be on same day as CPE appt with Dr Darnell Level on 08/16/22.  Labs from 07/22/22 ok for injection.  We will get Prolia in mail at our office on 08/14/22, per [facility] [place] at ph#[phone].

## 2022-08-07 NOTE — Telephone Encounter (Signed)
Patient called and said the pharmacy was gonna send over for a new script for Proair albuterol inhaler . [facility] [place]. She said the one she has now is in the red and wont make it through the day

## 2022-08-07 NOTE — Telephone Encounter (Signed)
Noted. Albuterol refilled. Thanks.

## 2022-08-08 ENCOUNTER — Other Ambulatory Visit: Payer: 59

## 2022-08-09 ENCOUNTER — Other Ambulatory Visit: Payer: 59

## 2022-08-09 ENCOUNTER — Other Ambulatory Visit: Payer: Self-pay | Admitting: Family Medicine

## 2022-08-09 ENCOUNTER — Telehealth: Payer: Self-pay | Admitting: Family Medicine

## 2022-08-09 DIAGNOSIS — M810 Age-related osteoporosis without current pathological fracture: Secondary | ICD-10-CM

## 2022-08-09 DIAGNOSIS — D509 Iron deficiency anemia, unspecified: Secondary | ICD-10-CM

## 2022-08-09 DIAGNOSIS — E785 Hyperlipidemia, unspecified: Secondary | ICD-10-CM

## 2022-08-09 NOTE — Telephone Encounter (Signed)
Stephanie from [facility]  called in stating that Patient does not OT,they will not be doing her evaluation.

## 2022-08-09 NOTE — Telephone Encounter (Signed)
She declined

## 2022-08-09 NOTE — Telephone Encounter (Signed)
Did patient decline OT or did therapist think she didn't qualify for OT?

## 2022-08-11 ENCOUNTER — Other Ambulatory Visit: Payer: Self-pay | Admitting: Pulmonary Disease

## 2022-08-11 ENCOUNTER — Other Ambulatory Visit: Payer: Self-pay | Admitting: Family Medicine

## 2022-08-11 DIAGNOSIS — F419 Anxiety disorder, unspecified: Secondary | ICD-10-CM

## 2022-08-11 DIAGNOSIS — F331 Major depressive disorder, recurrent, moderate: Secondary | ICD-10-CM

## 2022-08-15 ENCOUNTER — Inpatient Hospital Stay: Admission: RE | Admit: 2022-08-15 | Payer: 59 | Source: Ambulatory Visit

## 2022-08-16 ENCOUNTER — Telehealth (INDEPENDENT_AMBULATORY_CARE_PROVIDER_SITE_OTHER): Payer: 59 | Admitting: Family Medicine

## 2022-08-16 ENCOUNTER — Encounter: Payer: Self-pay | Admitting: Family Medicine

## 2022-08-16 VITALS — Temp 98.8°F | Ht 65.0 in | Wt 82.0 lb

## 2022-08-16 DIAGNOSIS — Z9981 Dependence on supplemental oxygen: Secondary | ICD-10-CM

## 2022-08-16 DIAGNOSIS — R64 Cachexia: Secondary | ICD-10-CM

## 2022-08-16 DIAGNOSIS — F5104 Psychophysiologic insomnia: Secondary | ICD-10-CM

## 2022-08-16 DIAGNOSIS — R413 Other amnesia: Secondary | ICD-10-CM

## 2022-08-16 DIAGNOSIS — R000 Tachycardia, unspecified: Secondary | ICD-10-CM

## 2022-08-16 DIAGNOSIS — Z720 Tobacco use: Secondary | ICD-10-CM

## 2022-08-16 DIAGNOSIS — E440 Moderate protein-calorie malnutrition: Secondary | ICD-10-CM

## 2022-08-16 DIAGNOSIS — Z515 Encounter for palliative care: Secondary | ICD-10-CM

## 2022-08-16 DIAGNOSIS — M810 Age-related osteoporosis without current pathological fracture: Secondary | ICD-10-CM

## 2022-08-16 DIAGNOSIS — J449 Chronic obstructive pulmonary disease, unspecified: Secondary | ICD-10-CM

## 2022-08-16 DIAGNOSIS — J9611 Chronic respiratory failure with hypoxia: Secondary | ICD-10-CM

## 2022-08-16 DIAGNOSIS — F332 Major depressive disorder, recurrent severe without psychotic features: Secondary | ICD-10-CM

## 2022-08-16 MED ORDER — ALBUTEROL SULFATE HFA 108 (90 BASE) MCG/ACT IN AERS: 2.0000 | INHALATION_SPRAY | Freq: Four times a day (QID) | RESPIRATORY_TRACT | 3 refills | Status: DC | PRN

## 2022-08-16 MED ORDER — TRAZODONE HCL 50 MG PO TABS: 50.0000 mg | ORAL_TABLET | Freq: Every day | ORAL | 0 refills | Status: DC

## 2022-08-16 NOTE — Progress Notes (Signed)
Patient ID: Christine Serrano, female    DOB: 12/11/1960, 62 y.o.   MRN: [id number]  Virtual visit completed through [facility], a video enabled telemedicine application. Due to national recommendations of social distancing due to COVID-19, a virtual visit is felt to be most appropriate for this patient at this time. Reviewed limitations, risks, security and privacy concerns of performing a virtual visit and the availability of in person appointments. I also reviewed that there may be a patient responsible charge related to this service. The patient agreed to proceed.   Patient location: home Provider location: [place] at [facility], office Persons participating in this virtual visit: patient, partner Christine Serrano, provider   If any vitals were documented, they were collected by patient at home unless specified below.    Temp 98.8 F (37.1 C)   Ht '5\' 5"'$  (1.651 m)   Wt 82 lb (37.2 kg)   LMP  (LMP Unknown)   SpO2 97% Comment: 6LNC  BMI 13.65 kg/m    CC: CPE --> converted to acute visit as pt as unable to come into office today Subjective:   HPI: Christine Serrano is a 62 y.o. female presenting on 08/16/2022 for COPD (Worsening COPD. Also, c/o leg weakness after taking 2-3 steps. Wants to discuss hospice. )   Known severe oxygen dependent COPD, continued smoker.  She would qualify for hospice however she has previously declined as she would not have been able to use motorized scooter or portable oxygen concentrator.  Has personal care aide Christine Serrano who helps both pt and her partner Christine Serrano.   Would like to discuss worsening COPD as evidenced by worse dyspnea, trouble gaining weight with decreased appetite, increased somnolence worsening memory - requests re-eval by hospice. She states she's increased oxygen use up to 6 liters via [place] at a time.   Was scheduled for prolia injection today - this will need to be rescheduled.   Was receiving HH PT/OT through [facility] [place] - she cancelled all future OT and  PT in the past 2 weeks.   Missed abd/pelvic CT scheduled yesterday for LLE edema and RLQ abd pain (this has been rescheduled several times).      Relevant past medical, surgical, family and social history reviewed and updated as indicated. Interim medical history since our last visit reviewed. Allergies and medications reviewed and updated. Outpatient Medications Prior to Visit  Medication Sig Dispense Refill   albuterol (PROVENTIL) (2.5 MG/3ML) 0.083% nebulizer solution Take 3 mLs (2.5 mg total) by nebulization every 6 (six) hours as needed for wheezing or shortness of breath. 150 mL 6   benzonatate (TESSALON) 100 MG capsule TAKE 1 CAPSULE BY MOUTH THREE TIMES A DAY AS NEEDED FOR COUGH. NOT COVERED BY INSURANCE. 30 capsule 30   BREZTRI AEROSPHERE 160-9-4.8 MCG/ACT AERO INHALE 2 PUFFS INTO THE LUNGS IN THE MORNING AND AT BEDTIME. 10.7 each 12   buPROPion (WELLBUTRIN XL) 150 MG 24 hr tablet Take 1 tablet (150 mg total) by mouth daily. 90 tablet 1   cholecalciferol (VITAMIN D3) 25 MCG (1000 UNIT) tablet Take 1,000 Units by mouth daily.     fluticasone (FLONASE) 50 MCG/ACT nasal spray Place 2 sprays into both nostrils daily. 48 g 3   folic acid (FOLVITE) 1 MG tablet Take 1 tablet (1 mg total) by mouth daily. 90 tablet 1   gabapentin (NEURONTIN) 100 MG capsule TAKE 1 CAPSULE BY MOUTH TWICE  DAILY 200 capsule 1   hydrOXYzine (ATARAX) 25 MG tablet Take 1 tablet (  25 mg total) by mouth 2 (two) times daily as needed for anxiety. 60 tablet 6   Iron, Ferrous Sulfate, 325 (65 Fe) MG TABS Take 325 mg by mouth daily. 30 tablet    LINZESS 290 MCG CAPS capsule Take 1 capsule (290 mcg total) by mouth daily as needed (constipation). TAKE 1 CAPSULE EVERY DAY AS NEEDED FOR CONSTIPATION AS DIRECTED 90 capsule 1   meclizine (ANTIVERT) 25 MG tablet Take 1 tablet (25 mg total) by mouth 3 (three) times daily as needed for dizziness. 30 tablet 0   methocarbamol (ROBAXIN) 500 MG tablet TAKE 1 TABLET BY MOUTH 3 TIMES DAILY  AS NEEDED FOR MUSCLE SPASMS (SEDATION PRECAUTIONS). 40 tablet 3   Multiple Vitamin (MULTIVITAMIN WITH MINERALS) TABS tablet Take 1 tablet by mouth daily.     naproxen (NAPROSYN) 375 MG tablet TAKE 1 TABLET BY MOUTH TWICE  DAILY AS NEEDED FOR PAIN WITH  FOOD 60 tablet 4   omeprazole (PRILOSEC) 40 MG capsule TAKE 1 CAPSULE BY MOUTH DAILY. FOR THREE WEEKS THEN AS NEEDED. TAKE 30 MIN PRIOR TO LARGE MEAL 90 capsule 0   OXYGEN Inhale 4 L/min into the lungs continuous.      Potassium 99 MG TABS Take by mouth.     predniSONE (DELTASONE) 5 MG tablet TAKE 2 TABLETS BY MOUTH DAILY WITH BREAKFAST. 60 tablet 0   sertraline (ZOLOFT) 100 MG tablet TAKE 1.5 TABLETS (150 MG TOTAL) BY MOUTH DAILY 135 tablet 0   thiamine 100 MG tablet Take 1 tablet (100 mg total) by mouth daily. 30 tablet 0   vitamin B-12 (CYANOCOBALAMIN) 1000 MCG tablet Take 1 tablet (1,000 mcg total) by mouth daily.     albuterol (VENTOLIN HFA) 108 (90 Base) MCG/ACT inhaler INHALE 2 PUFFS BY MOUTH EVERY 6 HOURS AS NEEDED FOR WHEEZE OR SHORTNESS OF BREATH 18 each 3   mirtazapine (REMERON) 15 MG tablet TAKE 1 TABLET BY MOUTH EVERYDAY AT BEDTIME 90 tablet 0   No facility-administered medications prior to visit.     Per HPI unless specifically indicated in ROS section below Review of Systems Objective:  Temp 98.8 F (37.1 C)   Ht '5\' 5"'$  (1.651 m)   Wt 82 lb (37.2 kg)   LMP  (LMP Unknown)   SpO2 97% Comment: 6LNC  BMI 13.65 kg/m   Wt Readings from Last 3 Encounters:  08/16/22 82 lb (37.2 kg)  07/22/22 82 lb (37.2 kg)  05/14/22 84 lb (38.1 kg)       Physical exam: Gen: alert, NAD, chronically ill appearing, cachectic with supplemental oxygen via [place] in place Pulm: speaks in complete sentences without cough  Psych: depressed mood, normal thought content      Results for orders placed or performed in visit on 07/22/22  D-dimer, quantitative  Result Value Ref Range   D-Dimer, Quant 0.43 <0.50 mcg/mL FEU  Comprehensive metabolic panel   Result Value Ref Range   Sodium 140 135 - 145 mEq/L   Potassium 3.8 3.5 - 5.1 mEq/L   Chloride 95 (L) 96 - 112 mEq/L   CO2 30 19 - 32 mEq/L   Glucose, Bld 115 (H) 70 - 99 mg/dL   BUN 8 6 - 23 mg/dL   Creatinine, Ser 0.42 0.40 - 1.20 mg/dL   Total Bilirubin 0.4 0.2 - 1.2 mg/dL   Alkaline Phosphatase 117 39 - 117 U/L   AST 27 0 - 37 U/L   ALT 16 0 - 35 U/L   Total Protein 4.9 (L)  6.0 - 8.3 g/dL   Albumin 2.8 (L) 3.5 - 5.2 g/dL   GFR 104.01 >60.00 mL/min   Calcium 8.5 8.4 - 10.5 mg/dL  TSH  Result Value Ref Range   TSH 1.45 0.35 - 5.50 uIU/mL  CBC with Differential/Platelet  Result Value Ref Range   WBC 9.8 4.0 - 10.5 K/uL   RBC 3.36 (L) 3.87 - 5.11 Mil/uL   Hemoglobin 10.1 (L) 12.0 - 15.0 g/dL   HCT 30.3 (L) 36.0 - 46.0 %   MCV 90.2 78.0 - 100.0 fl   MCHC 33.3 30.0 - 36.0 g/dL   RDW 14.2 11.5 - 15.5 %   Platelets 277.0 150.0 - 400.0 K/uL   Neutrophils Relative % 85.4 aH (H) 43.0 - 77.0 %   Lymphocytes Relative 11.6 (L) 12.0 - 46.0 %   Monocytes Relative 2.2 (L) 3.0 - 12.0 %   Eosinophils Relative 0.3 0.0 - 5.0 %   Basophils Relative 0.5 0.0 - 3.0 %   Neutro Abs 8.3 (H) 1.4 - 7.7 K/uL   Lymphs Abs 1.1 0.7 - 4.0 K/uL   Monocytes Absolute 0.2 0.1 - 1.0 K/uL   Eosinophils Absolute 0.0 0.0 - 0.7 K/uL   Basophils Absolute 0.0 0.0 - 0.1 K/uL  Vitamin B12  Result Value Ref Range   Vitamin B-12 1,038 (H) 211 - 911 pg/mL  Folate  Result Value Ref Range   Folate >23.8 >5.9 ng/mL  Vitamin B1  Result Value Ref Range   Vitamin B1 (Thiamine) <6 (L) 8 - 30 nmol/L   Assessment & Plan:   COPD, very severe (HCC) Assessment & Plan: Severe oxygen and steroid dependent emphysema.  Has not recently seen pulm. Notes progression of dyspnea.  Would qualify for hospice - referral placed.  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    Chronic insomnia Assessment & Plan: Mirtazapine ineffective - will change back to trazodone which has been more effective.    Moderate protein-calorie malnutrition (HCC)  Chronic respiratory failure with hypoxia, on home O2 therapy (HCC)  Severe episode of recurrent major depressive disorder, without psychotic features ([place]) Assessment & Plan: Chronic, uncontrolled despite sertraline '150mg'$  and wellbutrin XL '150mg'$  daily.  Will change mirtazapine '15mg'$  to trazodone '50mg'$  daily.    Memory difficulties Assessment & Plan: She notes progressive memory difficulties.  Plan was MMSE in office today however pt was unable to come into office.    Age-related osteoporosis without current pathological fracture Assessment & Plan: Overdue for prolia injection - she will come in for nurse visit for this.    Tachycardia  Tobacco abuse  Other orders -     traZODone HCl; Take 1 tablet (50 mg total) by mouth at bedtime.  Dispense: 90 tablet; Refill: 0 -     Albuterol Sulfate HFA; Inhale 2 puffs into the lungs every 6 (six) hours as needed for wheezing or shortness of breath.  Dispense: 18 g; Refill: 3     I discussed the assessment and treatment plan with the patient. The patient was provided an opportunity to ask questions and all were answered. The patient agreed with the plan and demonstrated an understanding of the instructions. The patient was advised to call back or seek an in-person evaluation if the symptoms worsen or if the condition fails to improve as  anticipated.  Follow up plan: No follow-ups on file.  Ria Bush, MD

## 2022-08-16 NOTE — Assessment & Plan Note (Signed)
New hospice referral placed per patient request.

## 2022-08-16 NOTE — Assessment & Plan Note (Signed)
Mirtazapine ineffective - will change back to trazodone which has been more effective.

## 2022-08-16 NOTE — Assessment & Plan Note (Signed)
Chronic, uncontrolled despite sertraline '150mg'$  and wellbutrin XL '150mg'$  daily.  Will change mirtazapine '15mg'$  to trazodone '50mg'$  daily.

## 2022-08-16 NOTE — Assessment & Plan Note (Signed)
She notes progressive memory difficulties.  Plan was MMSE in office today however pt was unable to come into office.

## 2022-08-16 NOTE — Assessment & Plan Note (Signed)
Overdue for prolia injection - she will come in for nurse visit for this.

## 2022-08-16 NOTE — Assessment & Plan Note (Signed)
Severe oxygen and steroid dependent emphysema.  Has not recently seen pulm. Notes progression of dyspnea.  Would qualify for hospice - referral placed.  I also filled Proair albuterol inhaler per insurance request

## 2022-08-19 ENCOUNTER — Telehealth: Payer: Self-pay | Admitting: Family Medicine

## 2022-08-19 NOTE — Telephone Encounter (Signed)
Called and spoke to Christine Serrano which had called earlier. Dr. Bosie Clos message was relayed to Christine Serrano.  Christine Serrano stated that she wants to make sure that Dr. Danise Mina is okay referring comfort orders back to them.

## 2022-08-19 NOTE — Telephone Encounter (Signed)
Ok to be PCP.  Yes I think pt has 6 months or less.

## 2022-08-19 NOTE — Telephone Encounter (Signed)
Spoke with Christine Serrano informing her Dr. Darnell Level is ok referring comfort orders back to them. She verbalizes understanding.

## 2022-08-19 NOTE — Telephone Encounter (Signed)
Yes that would be great - thanks.

## 2022-08-19 NOTE — Telephone Encounter (Signed)
[facility] called stating the family of the pt has requested that Dr. Darnell Level service as the attending provider for pt? [facility] wanted to know if Dr. Darnell Level believes the pt has 6 months or less to live? [place] stated they needed a response by 4pm today, 3/4. Call back # [phone], secured

## 2022-08-20 ENCOUNTER — Ambulatory Visit: Payer: Medicare HMO

## 2022-08-20 ENCOUNTER — Other Ambulatory Visit: Payer: Medicare HMO

## 2022-08-20 ENCOUNTER — Ambulatory Visit: Payer: 59

## 2022-08-28 ENCOUNTER — Other Ambulatory Visit: Payer: Self-pay | Admitting: Family Medicine

## 2022-08-28 ENCOUNTER — Other Ambulatory Visit: Payer: Self-pay | Admitting: Pulmonary Disease

## 2022-08-28 NOTE — Telephone Encounter (Signed)
Too soon. Trazodone refill sent 08/16/22, #90/0. Omeprazole refill sent 08/12/22, #90/0. Both sent to [facility].   Requests denied.

## 2022-08-29 ENCOUNTER — Ambulatory Visit: Payer: 59 | Admitting: Pulmonary Disease

## 2022-08-30 ENCOUNTER — Telehealth: Payer: Self-pay

## 2022-08-30 NOTE — Telephone Encounter (Signed)
Patient is on list for prolia, I see where she has been sent for referral to Hospice. Do we need to move forward with prolia or hold off at this time?

## 2022-08-30 NOTE — Telephone Encounter (Signed)
Can we call patient or [facility] hospice for update?  I referred her to hospice per her request after discussion at last virtual visit 08/16/2022 however I received revocation notice from [facility] stating patient had declined continued hospice care, therefore discharged 08/20/2022.  Unclear if she is or is not currently on hospice.

## 2022-08-30 NOTE — Telephone Encounter (Signed)
Spoke with pt asking about hospice care. States she declined due to cost. I mentioned about her missing [place] for Prolia inj, pt states she wasn't feeling the best that day and forgot to call and cancel. Pt states she will call back to r/s Prolia inj but will need it done curbside. Fyi to Dr. Darnell Level and Lavina Hamman.

## 2022-09-02 DIAGNOSIS — J449 Chronic obstructive pulmonary disease, unspecified: Secondary | ICD-10-CM | POA: Diagnosis not present

## 2022-09-02 DIAGNOSIS — I5032 Chronic diastolic (congestive) heart failure: Secondary | ICD-10-CM | POA: Diagnosis not present

## 2022-09-02 NOTE — Telephone Encounter (Signed)
Patient will need labs rechecked before we are able to do prolia as well. Will await her call to schedule both.

## 2022-09-03 DIAGNOSIS — J9611 Chronic respiratory failure with hypoxia: Secondary | ICD-10-CM | POA: Diagnosis not present

## 2022-09-03 DIAGNOSIS — J449 Chronic obstructive pulmonary disease, unspecified: Secondary | ICD-10-CM | POA: Diagnosis not present

## 2022-09-03 DIAGNOSIS — Z7409 Other reduced mobility: Secondary | ICD-10-CM | POA: Diagnosis not present

## 2022-09-05 ENCOUNTER — Telehealth: Payer: Self-pay | Admitting: Family Medicine

## 2022-09-05 NOTE — Telephone Encounter (Signed)
Contacted Christine Serrano to schedule their annual wellness visit. Appointment made for 10/01/2022.  [place] Direct Dial: [phone]

## 2022-09-12 NOTE — Telephone Encounter (Addendum)
Lvm asking pt to call back.. Need to notify pt I can send another omeprazole refill but pt may have to pay out-of-pocket because insurance will say it's too soon. Just let me know if she still wants refill sent in.

## 2022-09-12 NOTE — Telephone Encounter (Signed)
Patient called in to follow up on this request. Informed of the message below and patient stated that her dog had knocked them over.

## 2022-09-13 ENCOUNTER — Other Ambulatory Visit: Payer: Self-pay | Admitting: Family Medicine

## 2022-09-16 ENCOUNTER — Other Ambulatory Visit: Payer: Self-pay | Admitting: Family Medicine

## 2022-09-16 NOTE — Telephone Encounter (Signed)
Unable to reach patient. Left voicemail to return call to our office.   

## 2022-09-16 NOTE — Telephone Encounter (Signed)
LAST APPOINTMENT DATE: 09/05/2022   NEXT APPOINTMENT DATE: 09/16/2022    LAST REFILL:08/16/22  QTY: #90 w/ no refills

## 2022-09-18 NOTE — Telephone Encounter (Signed)
Lvm asking pt to call back.. Need to notify pt I can send another omeprazole refill but pt may have to pay out-of-pocket because insurance will say it's too soon. Just let me know if she still wants refill sent in.  

## 2022-09-24 ENCOUNTER — Encounter: Payer: Self-pay | Admitting: Family Medicine

## 2022-09-24 NOTE — Telephone Encounter (Signed)
Patient notified as instructed by telephone and verbalized understanding. Patient stated that she can not remember picking up the last scripts sent in. Patient stated that she will reach out to the pharmacy and see if those prescriptions are on hold. Patient stated that she will call the office back if she needs anything further from Dr. Sharen Hones.

## 2022-09-24 NOTE — Telephone Encounter (Signed)
error 

## 2022-09-24 NOTE — Telephone Encounter (Signed)
Error

## 2022-09-27 ENCOUNTER — Telehealth: Payer: Self-pay | Admitting: Family Medicine

## 2022-09-27 NOTE — Telephone Encounter (Signed)
Spoke with pt asking if she uses [employer]. Pt states she does not use them and has told them that. Plz disregard any further calls/faxes from this company.

## 2022-09-27 NOTE — Telephone Encounter (Signed)
[facility]   [phone] prescription, medication request, not a refill request Fax# [phone]

## 2022-10-01 ENCOUNTER — Telehealth: Payer: Self-pay | Admitting: Family Medicine

## 2022-10-01 ENCOUNTER — Ambulatory Visit (INDEPENDENT_AMBULATORY_CARE_PROVIDER_SITE_OTHER): Payer: 59

## 2022-10-01 VITALS — Ht 65.0 in | Wt 80.0 lb

## 2022-10-01 DIAGNOSIS — Z780 Asymptomatic menopausal state: Secondary | ICD-10-CM | POA: Diagnosis not present

## 2022-10-01 DIAGNOSIS — Z0000 Encounter for general adult medical examination without abnormal findings: Secondary | ICD-10-CM

## 2022-10-01 DIAGNOSIS — Z1231 Encounter for screening mammogram for malignant neoplasm of breast: Secondary | ICD-10-CM | POA: Diagnosis not present

## 2022-10-01 DIAGNOSIS — M8580 Other specified disorders of bone density and structure, unspecified site: Secondary | ICD-10-CM | POA: Insufficient documentation

## 2022-10-01 NOTE — Progress Notes (Signed)
I connected with  [name] on 10/01/22 by a audio enabled telemedicine application and verified that I am speaking with the correct person using two identifiers.  Patient Location: Home  Provider Location: Office/Clinic  I discussed the limitations of evaluation and management by telemedicine. The patient expressed understanding and agreed to proceed.  Subjective:   Christine Serrano is a 62 y.o. female who presents for Medicare Annual (Subsequent) preventive examination.  Review of Systems      Cardiac Risk Factors include: advanced age (>33men, >93 women);sedentary lifestyle     Objective:    Today's Vitals   10/01/22 1008  Weight: 80 lb (36.3 kg)  Height: 5\' 5"  (1.651 m)   Body mass index is 13.31 kg/m.     10/01/2022   10:31 AM 06/06/2021    9:47 AM 05/26/2021    8:10 PM 05/13/2021    9:27 PM 10/17/2020    5:17 PM 05/26/2020    8:21 AM 01/08/2020    7:08 AM  Advanced Directives  Does Patient Have a Medical Advance Directive? Yes Yes Yes No Yes Yes No  Type of Estate agent of [place];Living will Healthcare Power of [place];Living will   Out of facility DNR (pink MOST or yellow form) Healthcare Power of [place];Living will   Does patient want to make changes to medical advance directive? No - Patient declined Yes (MAU/Ambulatory/Procedural Areas - Information given)   No - Patient declined    Copy of Healthcare Power of Attorney in Chart? Yes - validated most recent copy scanned in chart (See row information) Yes - validated most recent copy scanned in chart (See row information)    Yes - validated most recent copy scanned in chart (See row information)   Would patient like information on creating a medical advance directive?    No - Patient declined   No - Patient declined  Pre-existing out of facility DNR order (yellow form or pink MOST form)     Pink Most/Yellow Form available - Physician notified to receive inpatient order      Current  Medications (verified) Outpatient Encounter Medications as of 10/01/2022  Medication Sig   albuterol (PROAIR HFA) 108 (90 Base) MCG/ACT inhaler Inhale 2 puffs into the lungs every 6 (six) hours as needed for wheezing or shortness of breath.   albuterol (PROVENTIL) (2.5 MG/3ML) 0.083% nebulizer solution INHALE CONTENTS OF 1 VIAL VIA NEBULIZER EVERY 6 HOURS AS NEEDED FOR WHEEZE OR SHORTNESS OF BREATH   benzonatate (TESSALON) 100 MG capsule TAKE 1 CAPSULE BY MOUTH THREE TIMES A DAY AS NEEDED FOR COUGH. NOT COVERED BY INSURANCE.   BREZTRI AEROSPHERE 160-9-4.8 MCG/ACT AERO INHALE 2 PUFFS INTO THE LUNGS IN THE MORNING AND AT BEDTIME.   buPROPion (WELLBUTRIN XL) 150 MG 24 hr tablet Take 1 tablet (150 mg total) by mouth daily.   cholecalciferol (VITAMIN D3) 25 MCG (1000 UNIT) tablet Take 1,000 Units by mouth daily.   fluticasone (FLONASE) 50 MCG/ACT nasal spray Place 2 sprays into both nostrils daily.   folic acid (FOLVITE) 1 MG tablet Take 1 tablet (1 mg total) by mouth daily.   gabapentin (NEURONTIN) 100 MG capsule TAKE 1 CAPSULE BY MOUTH TWICE  DAILY   hydrOXYzine (ATARAX) 25 MG tablet Take 1 tablet (25 mg total) by mouth 2 (two) times daily as needed for anxiety.   Iron, Ferrous Sulfate, 325 (65 Fe) MG TABS Take 325 mg by mouth daily.   LINZESS 290 MCG CAPS capsule Take 1 capsule (  290 mcg total) by mouth daily as needed (constipation). TAKE 1 CAPSULE EVERY DAY AS NEEDED FOR CONSTIPATION AS DIRECTED   loratadine (CLARITIN) 10 MG tablet TAKE 1 TABLET BY MOUTH EVERY DAY AS NEEDED   meclizine (ANTIVERT) 25 MG tablet Take 1 tablet (25 mg total) by mouth 3 (three) times daily as needed for dizziness.   methocarbamol (ROBAXIN) 500 MG tablet TAKE 1 TABLET BY MOUTH 3 TIMES DAILY AS NEEDED FOR MUSCLE SPASMS (SEDATION PRECAUTIONS).   Multiple Vitamin (MULTIVITAMIN WITH MINERALS) TABS tablet Take 1 tablet by mouth daily.   naproxen (NAPROSYN) 375 MG tablet TAKE 1 TABLET BY MOUTH TWICE  DAILY AS NEEDED FOR PAIN  WITH  FOOD   omeprazole (PRILOSEC) 40 MG capsule TAKE 1 CAPSULE BY MOUTH DAILY. FOR THREE WEEKS THEN AS NEEDED. TAKE 30 MIN PRIOR TO LARGE MEAL   OXYGEN Inhale 4 L/min into the lungs continuous.    Potassium 99 MG TABS Take by mouth.   predniSONE (DELTASONE) 5 MG tablet TAKE 2 TABLETS BY MOUTH DAILY WITH BREAKFAST.   sertraline (ZOLOFT) 100 MG tablet TAKE 1.5 TABLETS (150 MG TOTAL) BY MOUTH DAILY   thiamine 100 MG tablet Take 1 tablet (100 mg total) by mouth daily.   traZODone (DESYREL) 50 MG tablet TAKE 1 TABLET BY MOUTH EVERYDAY AT BEDTIME   vitamin B-12 (CYANOCOBALAMIN) 1000 MCG tablet Take 1 tablet (1,000 mcg total) by mouth daily.   No facility-administered encounter medications on file as of 10/01/2022.    Allergies (verified) Terbinafine and related, Clarithromycin, and Imitrex [sumatriptan]   History: Past Medical History:  Diagnosis Date   Cervical cancer    Chronic idiopathic constipation    Community acquired pneumonia of right upper lobe of lung 12/10/2016   COPD (chronic obstructive pulmonary disease)    Depression    Endometriosis    HLD (hyperlipidemia)    Smoker    Past Surgical History:  Procedure Laterality Date   ABLATION ON ENDOMETRIOSIS     APPENDECTOMY     BREAST EXCISIONAL BIOPSY Left age 81   benign biopsy - chronic fatty deposit   COLONOSCOPY WITH PROPOFOL N/A 08/04/2017   TAx3, angiodysplastic lesion, diverticulosis, rpt 5 yrs ([facility])   EXPLORATORY LAPAROTOMY  1992   endometriosis   FOOT SURGERY Right    needle imbeded   LASER ABLATION CONDYLOMA CERVICAL / VULVAR     NASAL SEPTUM SURGERY Bilateral    OVARIAN CYST SURGERY Bilateral 1980   remote   SALPINGOOPHORECTOMY Right 1983   ectopic pregnancy   TONSILLECTOMY     Family History  Problem Relation Age of Onset   Heart disease Mother    Hypertension Mother    Hypertension Father    COPD Father    Hearing loss Maternal Grandmother    Hypertension Maternal Grandmother    Hearing loss  Maternal Grandfather    Hypertension Maternal Grandfather    Colon cancer Paternal Grandmother    Social History   Socioeconomic History   Marital status: Divorced    Spouse name: Not on file   Number of children: 0   Years of education: Not on file   Highest education level: Not on file  Occupational History   Occupation: disabled  Tobacco Use   Smoking status: Every Day    Packs/day: 1.00    Years: 51.00    Additional pack years: 0.00    Total pack years: 51.00    Types: Cigarettes   Smokeless tobacco: Never   Tobacco comments:  Less than 1ppd 08/28/2021  Vaping Use   Vaping Use: Never used  Substance and Sexual Activity   Alcohol use: Yes    Comment: occasional   Drug use: No   Sexual activity: Never  Other Topics Concern   Not on file  Social History Narrative   Lives with Angie   Occ: disability, 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)   Activity: limited by dyspnea   Diet: some water daily, fruits/vegetables daily   Social Determinants of Health   Financial Resource Strain: Low Risk  (10/01/2022)   Overall Financial Resource Strain (CARDIA)    Difficulty of Paying Living Expenses: Not hard at all  Food Insecurity: No Food Insecurity (10/01/2022)   Hunger Vital Sign    Worried About Running Out of Food in the Last Year: Never true    Ran Out of Food in the Last Year: Never true  Transportation Needs: No Transportation Needs (10/01/2022)   PRAPARE - Administrator, Civil Service (Medical): No    Lack of Transportation (Non-Medical): No  Physical Activity: Inactive (10/01/2022)   Exercise Vital Sign    Days of Exercise per Week: 0 days    Minutes of Exercise per Session: 0 min  Stress: Stress Concern Present (10/01/2022)   [employer] of Occupational Health - Occupational Stress Questionnaire    Feeling of Stress : Very much  Social Connections: Socially Isolated (10/01/2022)   Social Connection and Isolation  Panel [NHANES]    Frequency of Communication with Friends and Family: More than three times a week    Frequency of Social Gatherings with Friends and Family: More than three times a week    Attends Religious Services: Never    Database administrator or Organizations: No    Attends Engineer, structural: Never    Marital Status: Divorced    Tobacco Counseling Ready to quit: No Counseling given: Yes Tobacco comments: Less than 1ppd 08/28/2021   Clinical Intake:  Pre-visit preparation completed: Yes  Pain : No/denies pain     Nutritional Risks: None Diabetes: No  How often do you need to have someone help you when you read instructions, pamphlets, or other written materials from your doctor or pharmacy?: 1 - Never  Diabetic? no  Interpreter Needed?: No  Information entered by :: C.Maelani Yarbro LPN   Activities of Daily Living    10/01/2022   10:32 AM  In your present state of health, do you have any difficulty performing the following activities:  Hearing? 0  Vision? 0  Difficulty concentrating or making decisions? 1  Comment Occasionally  Walking or climbing stairs? 1  Comment COPD  Dressing or bathing? 1  Comment Aid assists  Doing errands, shopping? 0  Preparing Food and eating ? N  Using the Toilet? N  In the past six months, have you accidently leaked urine? Y  Comment occasionally  Do you have problems with loss of bowel control? N  Managing your Medications? N  Managing your Finances? N  Housekeeping or managing your Housekeeping? N    Patient Care Team: Eustaquio Boyden, MD as PCP - General (Family Medicine) Kathyrn Sheriff, [facility] as Pharmacist (Pharmacist)  Indicate any recent Medical Services you may have received from other than [facility] providers in the past year (date may be approximate).     Assessment:   This is a routine wellness examination for Ichelle.  Hearing/Vision screen Hearing Screening - Comments:: No aids Vision  Screening -  Comments:: Readers - unknown provider in [facility]  Dietary issues and exercise activities discussed: Current Exercise Habits: The patient does not participate in regular exercise at present, Exercise limited by: respiratory conditions(s) (COPD)   Goals Addressed             This Visit's Progress    Patient Stated       No new goals       Depression Screen    10/01/2022   10:27 AM 07/22/2022    3:54 PM 06/06/2021    9:52 AM 05/26/2020    8:24 AM 02/25/2019   11:41 AM 08/13/2018   12:43 PM 07/20/2018    3:38 PM  PHQ 2/9 Scores  PHQ - 2 Score 6 4 0 PHQ- 9 Score Fall Risk    10/01/2022   10:31 AM 07/25/2022   10:35 AM 07/22/2022    3:54 PM 06/06/2021    9:50 AM 05/26/2020    8:23 AM  Fall Risk   Falls in the past year? 0  Number falls in past yr: 0 0  Injury with Fall? 0 0 0 1 0  Comment    concussion and stitches   Risk for fall due to : No Fall Risks;Medication side effect;Impaired balance/gait;Impaired mobility   Impaired balance/gait Medication side effect  Follow up Falls prevention discussed;Falls evaluation completed;Education provided   Falls prevention discussed Falls evaluation completed;Falls prevention discussed    FALL RISK PREVENTION PERTAINING TO THE HOME:  Any stairs in or around the home? No  If so, are there any without handrails? No  Home free of loose throw rugs in walkways, pet beds, electrical cords, etc? Yes  Adequate lighting in your home to reduce risk of falls? Yes   ASSISTIVE DEVICES UTILIZED TO PREVENT FALLS:  Life alert? No  Use of a cane, walker or w/c? Yes  Grab bars in the bathroom? Yes  Shower chair or bench in shower? Yes  Elevated toilet seat or a handicapped toilet? Yes    Cognitive Function:    05/26/2020    8:32 AM 02/06/2018   11:20 AM 02/05/2017    2:10 PM  MMSE - Mini Mental State Exam  Orientation to time Orientation to Place Registration Attention/  Calculation 5 0 0  Recall Language- name 2 objects  0 0  Language- repeat Language- follow 3 step command  3 3  Language- read & follow direction  0 0  Write a sentence  0 0  Copy design  0 0  Total score  20 20        10/01/2022   10:34 AM  6CIT Screen  What Year? 0 points  What month? 0 points  What time? 0 points  Count back from 20 0 points  Months in reverse 0 points  Repeat phrase 0 points  Total Score 0 points    Immunizations Immunization History  Administered Date(s) Administered   Influenza Split 04/17/2016   Influenza,inj,Quad PF,6+ Mos 02/21/2017, 03/17/2018, 02/25/2019, 03/13/2020, 05/18/2021   Pneumococcal Conjugate-13 05/11/2018   Pneumococcal Polysaccharide-23 06/17/2008   Zoster Recombinat (Shingrix) 08/30/2020    TDAP status: Due, Education has been provided regarding the importance of this vaccine. Advised may receive this vaccine at local pharmacy or  Health Dept. Aware to provide a copy of the vaccination record if obtained from local pharmacy or Health Dept. Verbalized acceptance and understanding.  Flu Vaccine status: Due, Education has been provided regarding the importance of this vaccine. Advised may receive this vaccine at local pharmacy or Health Dept. Aware to provide a copy of the vaccination record if obtained from local pharmacy or Health Dept. Verbalized acceptance and understanding.  Pneumococcal vaccine status: Up to date  Covid-19 vaccine status: Declined, Education has been provided regarding the importance of this vaccine but patient still declined. Advised may receive this vaccine at local pharmacy or Health Dept.or vaccine clinic. Aware to provide a copy of the vaccination record if obtained from local pharmacy or Health Dept. Verbalized acceptance and understanding.  Qualifies for Shingles Vaccine? Yes   Zostavax completed No   Shingrix Completed?: No.    Education has been provided regarding the importance of this  vaccine. Patient has been advised to call insurance company to determine out of pocket expense if they have not yet received this vaccine. Advised may also receive vaccine at local pharmacy or Health Dept. Verbalized acceptance and understanding.  Screening Tests Health Maintenance  Topic Date Due   COVID-19 Vaccine (1) Never done   PAP SMEAR-Modifier  02/22/2019   MAMMOGRAM  05/20/2019   Zoster Vaccines- Shingrix (2 of 2) 10/25/2020   COLONOSCOPY (Pts 45-64yrs Insurance coverage will need to be confirmed)  08/04/2022   COLON CANCER SCREENING ANNUAL FOBT  09/06/2022   DTaP/Tdap/Td (1 - Tdap) 02/06/2027 (Originally 01/03/1980)   INFLUENZA VACCINE  01/16/2023   Hepatitis C Screening  Completed   HIV Screening  Completed   HPV VACCINES  Aged Out    Health Maintenance  Health Maintenance Due  Topic Date Due   COVID-19 Vaccine (1) Never done   PAP SMEAR-Modifier  02/22/2019   MAMMOGRAM  05/20/2019   Zoster Vaccines- Shingrix (2 of 2) 10/25/2020   COLONOSCOPY (Pts 45-26yrs Insurance coverage will need to be confirmed)  08/04/2022   COLON CANCER SCREENING ANNUAL FOBT  09/06/2022    Colorectal cancer screening: Type of screening: FOBT/FIT. Completed 09/05/21. Repeat every 1 years   Mammogram status: Completed 05/19/2018. Repeat every year order placed.  Bone Density status: Ordered 10/01/22. Pt provided with contact info and advised to call to schedule appt.  Lung Cancer Screening: (Low Dose CT Chest recommended if Age 42-80 years, 30 pack-year currently smoking OR have quit w/in 15years.) does qualify.   Lung Cancer Screening Referral: Will discuss with PCP  Additional Screening:  Hepatitis C Screening: does qualify; Completed 02/05/17  Vision Screening: Recommended annual ophthalmology exams for early detection of glaucoma and other disorders of the eye. Is the patient up to date with their annual eye exam?  No pt will call for appointment. Who is the provider or what is the  name of the office in which the patient attends annual eye exams? [place], unknown provider If pt is not established with a provider, would they like to be referred to a provider to establish care? No .   Dental Screening: Recommended annual dental exams for proper oral hygiene  Community Resource Referral / Chronic Care Management: CRR required this visit?  No   CCM required this visit?  No      Plan:     I have personally reviewed and noted the following in the patient's chart:   Medical and social history Use of alcohol, tobacco or illicit drugs  Current medications and  supplements including opioid prescriptions. Patient is not currently taking opioid prescriptions. Functional ability and status Nutritional status Physical activity Advanced directives List of other physicians Hospitalizations, surgeries, and ER visits in previous 12 months Vitals Screenings to include cognitive, depression, and falls Referrals and appointments  In addition, I have reviewed and discussed with patient certain preventive protocols, quality metrics, and best practice recommendations. A written personalized care plan for preventive services as well as general preventive health recommendations were provided to patient.     Maryan Puls, LPN   1/61/0960   Nurse Notes: none

## 2022-10-01 NOTE — Patient Instructions (Addendum)
Christine Serrano , Thank you for taking time to come for your Medicare Wellness Visit. I appreciate your ongoing commitment to your health goals. Please review the following plan we discussed and let me know if I can assist you in the future.   These are the goals we discussed:  Goals      Patient Stated     Starting 02/06/2018, I will continue to take medications as prescribed.      Patient Stated     05/26/2020, I will maintain and continue medications as prescribed.      Patient Stated     Would like to start eating healthier     Patient Stated     No new goals     Quit Smoking        This is a list of the screening recommended for you and due dates:  Health Maintenance  Topic Date Due   COVID-19 Vaccine (1) Never done   Pap Smear  02/22/2019   Mammogram  05/20/2019   Zoster (Shingles) Vaccine (2 of 2) 10/25/2020   Colon Cancer Screening  08/04/2022   Stool Blood Test  09/06/2022   DTaP/Tdap/Td vaccine (1 - Tdap) 02/06/2027*   Flu Shot  01/16/2023   Hepatitis C Screening: USPSTF Recommendation to screen - Ages 18-79 yo.  Completed   HIV Screening  Completed   HPV Vaccine  Aged Out  *Topic was postponed. The date shown is not the original due date.   If you wish to quit smoking, help is available. For free tobacco cessation program offerings call the [facility] at 779-631-3859 or Live Well Line at 832 129 0616. You may also visit [email or web address] or email [email or web address] for more information on other programs.   You may also call 1-800-QUIT-NOW (979-550-9006) or visit www.[facility].ch or www.[email or web address] for additional resources on smoking cessation.    Advanced directives: in chart  Conditions/risks identified: Aim for 30 minutes of exercise or brisk walking, 6-8 glasses of water, and 5 servings of fruits and vegetables each day.   Next appointment: Follow up in one year for your annual wellness visit. 10/02/23 @ 9:45  televisit  Preventive Care 40-64 Years, Female Preventive care refers to lifestyle choices and visits with your health care provider that can promote health and wellness. What does preventive care include? A yearly physical exam. This is also called an annual well check. Dental exams once or twice a year. Routine eye exams. Ask your health care provider how often you should have your eyes checked. Personal lifestyle choices, including: Daily care of your teeth and gums. Regular physical activity. Eating a healthy diet. Avoiding tobacco and drug use. Limiting alcohol use. Practicing safe sex. Taking low-dose aspirin daily starting at age 61. Taking vitamin and mineral supplements as recommended by your health care provider. What happens during an annual well check? The services and screenings done by your health care provider during your annual well check will depend on your age, overall health, lifestyle risk factors, and family history of disease. Counseling  Your health care provider may ask you questions about your: Alcohol use. Tobacco use. Drug use. Emotional well-being. Home and relationship well-being. Sexual activity. Eating habits. Work and work Astron[employer]. Method of birth control. Menstrual cycle. Pregnancy history. Screening  You may have the following tests or measurements: Height, weight, and BMI. Blood pressure. Lipid and cholesterol levels. These may be checked every 5 years, or more frequently if you are over 21 years old. Skin check.  Lung cancer screening. You may have this screening every year starting at age 40 if you have a 30-pack-year history of smoking and currently smoke or have quit within the past 15 years. Fecal occult blood test (FOBT) of the stool. You may have this test every year starting at age 19. Flexible sigmoidoscopy or colonoscopy. You may have a sigmoidoscopy every 5 years or a colonoscopy every 10 years starting at age 59. Hepatitis C  blood test. Hepatitis B blood test. Sexually transmitted disease (STD) testing. Diabetes screening. This is done by checking your blood sugar (glucose) after you have not eaten for a while (fasting). You may have this done every 1-3 years. Mammogram. This may be done every 1-2 years. Talk to your health care provider about when you should start having regular mammograms. This may depend on whether you have a family history of breast cancer. BRCA-related cancer screening. This may be done if you have a family history of breast, ovarian, tubal, or peritoneal cancers. Pelvic exam and Pap test. This may be done every 3 years starting at age 37. Starting at age 35, this may be done every 5 years if you have a Pap test in combination with an HPV test. Bone density scan. This is done to screen for osteoporosis. You may have this scan if you are at high risk for osteoporosis. Discuss your test results, treatment options, and if necessary, the need for more tests with your health care provider. Vaccines  Your health care provider may recommend certain vaccines, such as: Influenza vaccine. This is recommended every year. Tetanus, diphtheria, and acellular pertussis (Tdap, Td) vaccine. You may need a Td booster every 10 years. Zoster vaccine. You may need this after age 54. Pneumococcal 13-valent conjugate (PCV13) vaccine. You may need this if you have certain conditions and were not previously vaccinated. Pneumococcal polysaccharide (PPSV23) vaccine. You may need one or two doses if you smoke cigarettes or if you have certain conditions. Talk to your health care provider about which screenings and vaccines you need and how often you need them. This information is not intended to replace advice given to you by your health care provider. Make sure you discuss any questions you have with your health care provider. Document Released: 06/30/2015 Document Revised: 02/21/2016 Document Reviewed: 04/04/2015 Elsevier  Interactive Patient Education  2017 [employer].    Fall Prevention in the Home Falls can cause injuries. They can happen to people of all ages. There are many things you can do to make your home safe and to help prevent falls. What can I do on the outside of my home? Regularly fix the edges of walkways and driveways and fix any cracks. Remove anything that might make you trip as you walk through a door, such as a raised step or threshold. Trim any bushes or trees on the path to your home. Use bright outdoor lighting. Clear any walking paths of anything that might make someone trip, such as rocks or tools. Regularly check to see if handrails are loose or broken. Make sure that both sides of any steps have handrails. Any raised decks and porches should have guardrails on the edges. Have any leaves, snow, or ice cleared regularly. Use sand or salt on walking paths during winter. Clean up any spills in your garage right away. This includes oil or grease spills. What can I do in the bathroom? Use night lights. Install grab bars by the toilet and in the tub and shower. Do not  use towel bars as grab bars. Use non-skid mats or decals in the tub or shower. If you need to sit down in the shower, use a plastic, non-slip stool. Keep the floor dry. Clean up any water that spills on the floor as soon as it happens. Remove soap buildup in the tub or shower regularly. Attach bath mats securely with double-sided non-slip rug tape. Do not have throw rugs and other things on the floor that can make you trip. What can I do in the bedroom? Use night lights. Make sure that you have a light by your bed that is easy to reach. Do not use any sheets or blankets that are too big for your bed. They should not hang down onto the floor. Have a firm chair that has side arms. You can use this for support while you get dressed. Do not have throw rugs and other things on the floor that can make you trip. What can I  do in the kitchen? Clean up any spills right away. Avoid walking on wet floors. Keep items that you use a lot in easy-to-reach places. If you need to reach something above you, use a strong step stool that has a grab bar. Keep electrical cords out of the way. Do not use floor polish or wax that makes floors slippery. If you must use wax, use non-skid floor wax. Do not have throw rugs and other things on the floor that can make you trip. What can I do with my stairs? Do not leave any items on the stairs. Make sure that there are handrails on both sides of the stairs and use them. Fix handrails that are broken or loose. Make sure that handrails are as long as the stairways. Check any carpeting to make sure that it is firmly attached to the stairs. Fix any carpet that is loose or worn. Avoid having throw rugs at the top or bottom of the stairs. If you do have throw rugs, attach them to the floor with carpet tape. Make sure that you have a light switch at the top of the stairs and the bottom of the stairs. If you do not have them, ask someone to add them for you. What else can I do to help prevent falls? Wear shoes that: Do not have high heels. Have rubber bottoms. Are comfortable and fit you well. Are closed at the toe. Do not wear sandals. If you use a stepladder: Make sure that it is fully opened. Do not climb a closed stepladder. Make sure that both sides of the stepladder are locked into place. Ask someone to hold it for you, if possible. Clearly mark and make sure that you can see: Any grab bars or handrails. First and last steps. Where the edge of each step is. Use tools that help you move around (mobility aids) if they are needed. These include: Canes. Walkers. Scooters. Crutches. Turn on the lights when you go into a dark area. Replace any light bulbs as soon as they burn out. Set up your furniture so you have a clear path. Avoid moving your furniture around. If any of your  floors are uneven, fix them. If there are any pets around you, be aware of where they are. Review your medicines with your doctor. Some medicines can make you feel dizzy. This can increase your chance of falling. Ask your doctor what other things that you can do to help prevent falls. This information is not intended to replace advice  given to you by your health care provider. Make sure you discuss any questions you have with your health care provider. Document Released: 03/30/2009 Document Revised: 11/09/2015 Document Reviewed: 07/08/2014 Elsevier Interactive Patient Education  2017 [employer].

## 2022-10-01 NOTE — Telephone Encounter (Signed)
Pt has stated she does not use this pharmacy. (See 09/27/22 phn note.). Plz do not give them any of pt's info.

## 2022-10-01 NOTE — Telephone Encounter (Signed)
[facility] called requesting a list of the pt's medications. [facility] requested list to be faxed to [phone]. Call back # [phone]

## 2022-10-03 DIAGNOSIS — J449 Chronic obstructive pulmonary disease, unspecified: Secondary | ICD-10-CM | POA: Diagnosis not present

## 2022-10-03 DIAGNOSIS — I5032 Chronic diastolic (congestive) heart failure: Secondary | ICD-10-CM | POA: Diagnosis not present

## 2022-10-04 ENCOUNTER — Telehealth: Payer: Self-pay | Admitting: Family Medicine

## 2022-10-04 DIAGNOSIS — J449 Chronic obstructive pulmonary disease, unspecified: Secondary | ICD-10-CM | POA: Diagnosis not present

## 2022-10-04 DIAGNOSIS — J9611 Chronic respiratory failure with hypoxia: Secondary | ICD-10-CM | POA: Diagnosis not present

## 2022-10-04 DIAGNOSIS — Z7409 Other reduced mobility: Secondary | ICD-10-CM | POA: Diagnosis not present

## 2022-10-04 NOTE — Telephone Encounter (Signed)
[facility] called requesting the pt's med list to be faxed to them? Fax # is [phone]. Call back # [phone]

## 2022-10-04 NOTE — Telephone Encounter (Signed)
Pt states she does not use them and has told them that. Plz disregard any further calls/faxes from this company. (See 09/27/22 phn note.)

## 2022-10-16 ENCOUNTER — Encounter: Payer: Self-pay | Admitting: Family Medicine

## 2022-10-17 ENCOUNTER — Telehealth: Payer: Self-pay

## 2022-10-17 ENCOUNTER — Ambulatory Visit: Payer: 59 | Admitting: Family Medicine

## 2022-10-17 NOTE — Progress Notes (Signed)
Care Management [facility] Coordination [facility] Pharmacy Team  Reason for Encounter: Cancelled appointment   Appointment with Al Corpus, PharmD on 10/22/2022 at 3:00 has been cancelled.   Al Corpus, PharmD notified  Claudina Lick, [place] Clinical Pharmacy Assistant [phone]

## 2022-10-18 ENCOUNTER — Encounter: Payer: Self-pay | Admitting: Family Medicine

## 2022-10-18 NOTE — Telephone Encounter (Addendum)
Spoke with patient.  She missed appt yesterday - couldn't get here in time.  She has caretaker Marchelle Folks living with her. Denies difficulty with transportation currently as caregiver can drive her.  Continues declining hospice - as they would not cover portable oxygen concentrator or scooter.

## 2022-10-22 ENCOUNTER — Encounter: Payer: Medicare HMO | Admitting: Pharmacist

## 2022-10-22 NOTE — Progress Notes (Unsigned)
Care Management & Coordination Services Pharmacy Note  10/22/2022 Name:  Christine Serrano MRN:  [id number] DOB:  Nov 03, 1960  Summary: Initial visit -Reviewed medications; pt affirms compliance as prescribed; she denies issues/side effects -Pt reports falls at home, becoming more frequent over the past ~2 months; she cannot say what causes the falls, possibly vertigo; she denies head injuries, she usually falls on her bottom and her CG or roommate will help her up -Pt reports daytime sleepiness (naps for a few hours midday) despite sleeping >12 hrs at night (~8pm-9am); discussed sedating medications taken early in the day can contribute to this (gabapentin, methocarbamol, hydroxyzine, meclizine)  Recommendations/Changes made from today's visit: -Discussed fall risk at length; avoid sedating medications early in the day, and avoid mixing them with alcohol -Consider PT evaluation for balance - consulting with PCP  Follow up plan: -Pharmacist follow up televisit scheduled for 3 months -PCP appt 08/16/22 (CPE)    Subjective: Christine Serrano is an 62 y.o. year old female who is a primary patient of Eustaquio Boyden, MD.  The care coordination team was consulted for assistance with disease management and care coordination needs.    Engaged with patient by telephone for initial visit. Patient lives with roommate/partner Angie and has a caregiver daily Marchelle Folks). Not on hospice - she would not have been able to use motorized scooter or portable oxygen concentrator.    Recent office visits: 08/16/22 Dr Sharen Hones VV: COPD - qualifies for hospice however previously declined as unable to use motorized scooter or portable O2 concentrator. Pt cancelled all OT/PT. Missed abd CT yesterday (has been rescheduled several times). Referred to hospice for re-eval per pt request.  Changed mirtazapine to trazodone.  07/22/22 Dr Sharen Hones OV: f/u - leg swelling x 2 mos, abd pain. Schedule CT, labs. Increase protein in  diet, Boost. Limit alcohol. Schedule pulm appt.  05/14/22 Dr Sharen Hones OV: abd pain - ordered CT. Lipase wnl.   01/15/22 Dr Sharen Hones OV: hospital f/u (COPD exacerbation) - advanced directive discussion, wants DNR, she was asked to bring copy.  08/31/21 Dr Sharen Hones OV: f/u - referred to hospice for end-stage COPD. Increase mirtazapine to 15 mg to reduce hypnotic effect.  Recent consult visits: 08/28/21 Dr Jayme Cloud (Pulmonary): COPD, in exacerbation. Rx Augmentin, prednisone, Discussed hospice. Discussed manage panic attacks with PCP.   Hospital visits: 12/24/21 ED visit [facility]): COPD exacerbation. Given Solu-medrol, albuterol nebulizer.   Objective:  Lab Results  Component Value Date   CREATININE 0.42 07/22/2022   BUN 8 07/22/2022   GFR 104.01 07/22/2022   GFRNONAA >60 12/24/2021   GFRAA >60 01/10/2020   NA 140 07/22/2022   K 3.8 07/22/2022   CALCIUM 8.5 07/22/2022   CO2 30 07/22/2022   GLUCOSE 115 (H) 07/22/2022    Lab Results  Component Value Date/Time   GFR 104.01 07/22/2022 03:58 PM   GFR 104.14 05/14/2022 04:10 PM    Last diabetic Eye exam: No results found for: "HMDIABEYEEXA"  Last diabetic Foot exam: No results found for: "HMDIABFOOTEX"   Lab Results  Component Value Date   CHOL 182 06/25/2021   HDL 55.30 06/25/2021   LDLCALC 97 06/25/2021   TRIG 152.0 (H) 06/25/2021   CHOLHDL 3 06/25/2021       Latest Ref Rng & Units 07/22/2022    3:58 PM 05/14/2022    4:10 PM 01/15/2022    1:19 PM  Hepatic Function  Total Protein 6.0 - 8.3 g/dL 4.9  5.4  5.3   Albumin 3.5 -  5.2 g/dL 2.8  3.1  3.0   AST 0 - 37 U/L 27  23  23    ALT 0 - 35 U/L 16  12  12    Alk Phosphatase 39 - 117 U/L 117  116  135   Total Bilirubin 0.2 - 1.2 mg/dL 0.4  0.3  0.3     Lab Results  Component Value Date/Time   TSH 1.45 07/22/2022 03:58 PM   TSH 4.22 09/26/2020 10:10 AM   FREET4 0.86 10/25/2016 11:09 AM       Latest Ref Rng & Units 07/22/2022    3:58 PM 05/14/2022    4:10 PM 12/24/2021     5:16 PM  CBC  WBC 4.0 - 10.5 K/uL 9.8  11.0  5.5   Hemoglobin 12.0 - 15.0 g/dL 16.1  09.6  04.5   Hematocrit 36.0 - 46.0 % 30.3  36.4  36.3   Platelets 150.0 - 400.0 K/uL 277.0  245.0  197     Lab Results  Component Value Date/Time   VD25OH 58.47 06/25/2021 11:49 AM   VD25OH 65.62 05/31/2020 10:07 AM   VITAMINB12 1,038 (H) 07/22/2022 03:58 PM   VITAMINB12 263 06/25/2021 11:49 AM    Clinical ASCVD: Yes  - aortic atherosclerosis The ASCVD Risk score (Arnett DK, et al., 2019) failed to calculate for the following reasons:   The systolic blood pressure is missing       10/01/2022   10:27 AM 07/22/2022    3:54 PM 06/06/2021    9:52 AM  Depression screen PHQ 2/9  Decreased Interest 3 1 0  Down, Depressed, Hopeless 3 3 0  PHQ - 2 Score 6 4 0  Altered sleeping 3 1   Tired, decreased energy 3    Change in appetite 2 3   Feeling bad or failure about yourself  3 1   Trouble concentrating 3 0   Moving slowly or fidgety/restless 0 0   Suicidal thoughts 0 0   PHQ-9 Score 20 9   Difficult doing work/chores Not difficult at all Extremely dIfficult      Social History   Tobacco Use  Smoking Status Every Day   Packs/day: 1.00   Years: 51.00   Additional pack years: 0.00   Total pack years: 51.00   Types: Cigarettes  Smokeless Tobacco Never  Tobacco Comments   Less than 1ppd 08/28/2021   BP Readings from Last 3 Encounters:  07/22/22 106/64  05/14/22 100/60  01/15/22 108/78   Pulse Readings from Last 3 Encounters:  07/22/22 (!) 103  05/14/22 90  01/15/22 (!) 118   Wt Readings from Last 3 Encounters:  10/01/22 80 lb (36.3 kg)  08/16/22 82 lb (37.2 kg)  07/22/22 82 lb (37.2 kg)   BMI Readings from Last 3 Encounters:  10/01/22 13.31 kg/m  08/16/22 13.65 kg/m  07/22/22 13.65 kg/m    Allergies  Allergen Reactions   Terbinafine And Related Diarrhea    Diarrheal illness while on terbinafine ?related to med   Clarithromycin     Caused Thrush    Imitrex [Sumatriptan]  Other (See Comments)    Worsens migraine     Medications Reviewed Today     Reviewed by Maryan Puls, LPN (Licensed Practical Nurse) on 10/01/22 at 1023  Med List Status: <None>   Medication Order Taking? Sig Documenting Provider Last Dose Status Informant  albuterol (PROAIR HFA) 108 (90 Base) MCG/ACT inhaler [id number] Yes Inhale 2 puffs into the lungs every 6 (six) hours  as needed for wheezing or shortness of breath. Eustaquio Boyden, MD Taking Active   albuterol (PROVENTIL) (2.5 MG/3ML) 0.083% nebulizer solution [id number] Yes INHALE CONTENTS OF 1 VIAL VIA NEBULIZER EVERY 6 HOURS AS NEEDED FOR WHEEZE OR SHORTNESS OF Georgette Dover, MD Taking Active   benzonatate (TESSALON) 100 MG capsule [id number] Yes TAKE 1 CAPSULE BY MOUTH THREE TIMES A DAY AS NEEDED FOR COUGH. NOT COVERED BY INSURANCE. Eustaquio Boyden, MD Taking Active   BREZTRI AEROSPHERE 160-9-4.8 MCG/ACT Sandrea Matte [id number] Yes INHALE 2 PUFFS INTO THE LUNGS IN THE MORNING AND AT BEDTIME. Salena Saner, MD Taking Active   buPROPion (WELLBUTRIN XL) 150 MG 24 hr tablet [id number] Yes Take 1 tablet (150 mg total) by mouth daily. Eustaquio Boyden, MD Taking Active   cholecalciferol (VITAMIN D3) 25 MCG (1000 UNIT) tablet [id number] Yes Take 1,000 Units by mouth daily. [provider] Taking Active Self  fluticasone (FLONASE) 50 MCG/ACT nasal spray [id number] Yes Place 2 sprays into both nostrils daily. Eustaquio Boyden, MD Taking Active   folic acid (FOLVITE) 1 MG tablet [id number] Yes Take 1 tablet (1 mg total) by mouth daily. Eustaquio Boyden, MD Taking Active   gabapentin (NEURONTIN) 100 MG capsule [id number] Yes TAKE 1 CAPSULE BY MOUTH TWICE  DAILY Eustaquio Boyden, MD Taking Active   hydrOXYzine (ATARAX) 25 MG tablet [id number] Yes Take 1 tablet (25 mg total) by mouth 2 (two) times daily as needed for anxiety. Eustaquio Boyden, MD Taking Active   Iron, Ferrous Sulfate, 325 (65 Fe) MG TABS [id number] Yes Take 325  mg by mouth daily. Eustaquio Boyden, MD Taking Active   LINZESS 290 MCG CAPS capsule [id number] Yes Take 1 capsule (290 mcg total) by mouth daily as needed (constipation). TAKE 1 CAPSULE EVERY DAY AS NEEDED FOR CONSTIPATION AS DIRECTED Eustaquio Boyden, MD Taking Active   loratadine (CLARITIN) 10 MG tablet [id number] Yes TAKE 1 TABLET BY MOUTH EVERY DAY AS NEEDED Eustaquio Boyden, MD Taking Active   meclizine (ANTIVERT) 25 MG tablet [id number] Yes Take 1 tablet (25 mg total) by mouth 3 (three) times daily as needed for dizziness. Eustaquio Boyden, MD Taking Active   methocarbamol (ROBAXIN) 500 MG tablet [id number] Yes TAKE 1 TABLET BY MOUTH 3 TIMES DAILY AS NEEDED FOR MUSCLE SPASMS (SEDATION PRECAUTIONS). Eustaquio Boyden, MD Taking Active   Multiple Vitamin (MULTIVITAMIN WITH MINERALS) TABS tablet [id number] Yes Take 1 tablet by mouth daily. Glade Lloyd, MD Taking Active Self  naproxen (NAPROSYN) 375 MG tablet [id number] Yes TAKE 1 TABLET BY MOUTH TWICE  DAILY AS NEEDED FOR PAIN WITH  FOOD Eustaquio Boyden, MD Taking Active   omeprazole (PRILOSEC) 40 MG capsule [id number] Yes TAKE 1 CAPSULE BY MOUTH DAILY. FOR THREE WEEKS THEN AS NEEDED. TAKE 30 MIN PRIOR TO LARGE MEAL Eustaquio Boyden, MD Taking Active   OXYGEN [id number] Yes Inhale 4 L/min into the lungs continuous.  [provider] Taking Active Self  Potassium 99 MG TABS [id number] Yes Take by mouth. [provider] Taking Active   predniSONE (DELTASONE) 5 MG tablet [id number] Yes TAKE 2 TABLETS BY MOUTH DAILY WITH BREAKFAST. Salena Saner, MD Taking Active   sertraline (ZOLOFT) 100 MG tablet [id number] Yes TAKE 1.5 TABLETS (150 MG TOTAL) BY MOUTH DAILY Eustaquio Boyden, MD Taking Active   thiamine 100 MG tablet [id number] Yes Take 1 tablet (100 mg total) by mouth daily. Glade Lloyd, MD Taking Active Self  traZODone (DESYREL) 50 MG tablet [id number] Yes TAKE 1 TABLET BY MOUTH EVERYDAY AT BEDTIME Sharen Hones,  Wynona Canes, MD Taking  Active   vitamin B-12 (CYANOCOBALAMIN) 1000 MCG tablet [id number] Yes Take 1 tablet (1,000 mcg total) by mouth daily. Eustaquio Boyden, MD Taking Active             SDOH:  (Social Determinants of Health) assessments and interventions performed: Yes SDOH Interventions    Flowsheet Row Clinical Support from 10/01/2022 in [facility] Coordination from 07/25/2022 in [facility] Clinical Support from 05/26/2020 in [facility] Visit from 05/26/2019 in [facility] at [facility]monary Rehab from 07/20/2018 in [facility] Rehab Pulmonary Rehab from 03/09/2018 in [facility] Rehab  SDOH Interventions        Food Insecurity Interventions Intervention Not Indicated -- -- -- -- --  Housing Interventions Intervention Not Indicated Intervention Not Indicated -- -- -- --  Transportation Interventions Intervention Not Indicated Intervention Not Indicated -- -- -- --  Utilities Interventions Intervention Not Indicated -- -- -- -- --  Alcohol Usage Interventions Intervention Not Indicated (Score <7), Patient Refused -- -- -- -- --  Depression Interventions/Treatment  Currently on Treatment, Medication, Patient refuses Treatment -- Currently on Treatment Medication, Currently on Treatment Currently on Treatment, Counseling, Medication Counseling, Currently on Treatment  [sees a therapist at [facility] Interventions Intervention Not Indicated -- -- -- -- --  Physical Activity Interventions Patient Refused, Other (Comments) -- -- -- -- --  Stress Interventions Patient Refused, Other (Comment)  [Currently on medication] -- -- -- -- --  Social Connections Interventions Intervention Not Indicated -- -- -- -- --       Medication Assistance: None required.  Patient affirms current coverage meets needs.  Medication Access: Within the past 30 days, how  often has patient missed a dose of medication? 0 Is a pillbox or other method used to improve adherence? Yes  Factors that may affect medication adherence? no barriers identified Are meds synced by current pharmacy? No  Are meds delivered by current pharmacy? No  Does patient experience delays in picking up medications due to transportation concerns? No   [facility] Reviewed: Is patient disadvantaged to use [facility]?: Yes  Current Rx insurance plan: UHC dual complete Name and location of Current pharmacy:  [facility] - [place], [place] - [address] [place] [address] Phone: [phone] Fax: [phone]  [id number] [place], [place] - [address] [place] [address] Phone: [phone] Fax: [phone]  [facility] - [place], [place] - [address] [place] [address] Phone: [phone] Fax: [phone]  [facility] - [place], [place] - [address] [place] [address] Phone: [phone] Fax: [phone]  [facility] services reviewed with patient today?: No  Patient requests to transfer care to [facility]?: No  Reason patient declined to change pharmacies: Disadvantaged due to insurance/mail order  Compliance/Adherence/Medication fill history: Care Gaps: Mammogram (due 05/2019)  Star-Rating Drugs: None   Assessment/Plan  COPD (Goal: control symptoms and prevent exacerbations) -Stable - pt reports SPO2 93%; on oxygen 24/7 -end-stage COPD, has been considered for hospice but ultimately declined; -Gold Grade: Gold 3 (FEV1 30-49%) -Current COPD Classification:  E (exacerbation leading to hospitalization OR 2+ moderate exacerbations) -Pulmonary function testing: 01/2018 -  FEV1: 0.76 L (31 %pred), FEV1/FVC: 53%  -Exacerbations requiring treatment in last 6  months:  0 -Current treatment  Albuterol HFA PRN every 1-2 hrs - Appropriate, Effective, Safe, Accessible Albuterol neb TID -Appropriate, Effective, Safe, Accessible Breztri 160-9-4.8 mcg/act 2 puff BID -Appropriate, Effective, Safe, Accessible Prednisone 5 mg -2 tab daily - Appropriate, Effective, Safe, Accessible Oxygen 2-4 L 24/7 -Medications previously tried: n/a -Patient reports consistent use of maintenance inhaler -Frequency of rescue inhaler use: every 1-2 hours -Counseled on Benefits of consistent maintenance inhaler use -Recommended to continue current medication  Tobacco use (Goal: reduction/cessation) -Uncontrolled - pt smoking 1/2-1 ppd currently -Previous quit attempts: unknown - reports MOTIVATION to quit is low -reports CONFIDENCE in quitting is very low -Encouraged reduction in cigarettes, pt is not particularly motivated to quit  Depression/Anxiety (Goal: manage mood) -Controlled - per pt report, mood is stable; pt does drink alcohol (fireball) every day and has been encouraged to limit alcohol use -PHQ9: 9 (07/2022) - mild depression -GAD7: 1 (07/2022) - minimal anxiety -Connected with PCP for mental health support -Current treatment: Bupropion XL 150 mg daily - Appropriate, Effective, Safe, Accessible Mirtazapine 15 mg HS - Appropriate, Effective, Safe, Accessible Sertraline 100 mg - 1.5 tab daily - Appropriate, Effective, Safe, Accessible Hydroxyzine 25 mg BID prn - takes AM - Appropriate, Effective, Safe, Accessible -Medications previously tried/failed: trazodone -Educated on Benefits of medication for symptom control -Recommended to continue current medication  Osteoporosis (Goal prevent fractures) -Not ideally controlled - pt reports frequent falls at home recently over the past 2 months; she does have a scooter to get around outside but does not use any assistance to get around the house -Last DEXA Scan: 05/2018   T-Score total hip: -3.5  T-Score lumbar spine:  -2.6 -Patient is a candidate for pharmacologic treatment due to T-Score < -2.5 in total hip  and T-Score < -2.5 in lumbar spine -Current treatment  Prolia (05/2018 - 01/2022) - Appropriate, Effective, Safe, Accessible Vitamin D 1000 IU -Medications previously tried: n/a  -Recommend [phone] units of vitamin D daily. -Discussed fall risk at length; discussed sedating medications that can contribute to falls (gabapentin, methocarbamol, meclizine, hydroxyzine)- advised to avoid combining these with alcohol; advised to consider walking assistance around the home (ie, cane) -Recommended to continue current medication  Hyperlipidemia: (LDL goal < 70) -Adequate control - LDL 97 (06/2021), given end-stage COPD, palliative/hospice status, will not pursue additional statin therapy -Hx aortic atherosclerosis, vertebral artery stenosis  GERD (Goal: mange sx) -Controlled - takes 2-3x per week -Current treatment  Omeprazole 40 mg PRN - Appropriate, Effective, Safe, Accessible -Medications previously tried: n/a  -Recommended to continue current medication  Constipation  (Goal: regular BM) -Controlled - takes once a week, reports regular BM for the most part -Current treatment  Linzess 290 mcg daily PRN - Appropriate, Effective, Safe, Accessible -Medications previously tried: n/a  -Recommended to continue current medication  Pain (Goal: manage sx) -Controlled - per pt report -Hx cervical neck pain -Current treatment  Gabapentin 100 mg BID PRN - Appropriate, Effective, Query Safe Methocarbamol 500 mg BID PRN -Appropriate, Effective, Query Safe Naproxen 500 mg BID PRN -Medications previously tried: n/a -Reviewed sedation risks with gabapentin, methocarbamol; discussed fall risk at length as above -Recommended to continue current medication; limit daytime use of sedating medications  Health Maintenance -Vaccine gaps: Shingrix #2, RSV, Covid -OTC: MVI, potassium 99, thiamine, folic acid, iron,  B12 -Vertigo: has meclizine PRN -Hx alcohol abuse, drinking flask with fireball daily -Daytime sleepiness: Pt reports sleeping a lot more lately in the daytime; she goes to sleep around  8pm - 9am; takes 1 nap a day for a couple hours; discussed taking sedating medications early in the day may be contributing, advised to limit these if she can -Weight loss/malnutrition: Pt is drinking Boost, trying to eat better   Al Corpus, PharmD, BCACP Clinical Pharmacist [facility] at [facility] [phone]

## 2022-10-29 ENCOUNTER — Telehealth: Payer: Self-pay | Admitting: Family Medicine

## 2022-10-29 NOTE — Telephone Encounter (Signed)
Mickeey from [employer] called in and stated that they needed a copy of all the patients current medications to be sent over to them. They can be faxed over to [phone]. Thank you!

## 2022-10-29 NOTE — Telephone Encounter (Signed)
Pt states she does not use this business. (See 09/27/22 phn note.)

## 2022-11-04 ENCOUNTER — Other Ambulatory Visit: Payer: Self-pay | Admitting: Family Medicine

## 2022-11-04 DIAGNOSIS — J449 Chronic obstructive pulmonary disease, unspecified: Secondary | ICD-10-CM

## 2022-11-04 DIAGNOSIS — F331 Major depressive disorder, recurrent, moderate: Secondary | ICD-10-CM

## 2022-11-04 DIAGNOSIS — M5090 Cervical disc disorder, unspecified, unspecified cervical region: Secondary | ICD-10-CM

## 2022-11-04 DIAGNOSIS — F410 Panic disorder [episodic paroxysmal anxiety] without agoraphobia: Secondary | ICD-10-CM

## 2022-11-04 DIAGNOSIS — F419 Anxiety disorder, unspecified: Secondary | ICD-10-CM

## 2022-11-04 NOTE — Telephone Encounter (Signed)
Prescription Request  11/04/2022  LOV: 07/22/2022  What is the name of the medication or equipment?  sertraline (ZOLOFT) 100 MG tablet omeprazole (PRILOSEC) 40 MG capsule  albuterol (PROAIR HFA) 108 (90 Base) MCG/ACT inhaler  benzonatate (TESSALON) 100 MG capsule  albuterol (PROVENTIL) (2.5 MG/3ML) 0.083% nebulizer solution  gabapentin (NEURONTIN) 100 MG capsule hydrOXYzine (ATARAX) 25 MG tablet  Have you contacted your pharmacy to request a refill? Yes   Which pharmacy would you like this sent to?   [id number] [place], [place] - [address] [place] [address] Phone: [phone] Fax: [phone]   Patient notified that their request is being sent to the clinical staff for review and that they should receive a response within 2 business days.   Please advise at Mobile [phone] (mobile)  Patient states she is needing refills on all these medications, [facility]

## 2022-11-04 NOTE — Telephone Encounter (Signed)
Last OV:  08/16/22, COPD Next OV:  11/14/22, fall f/u; 12/09/22, CPE

## 2022-11-05 NOTE — Telephone Encounter (Signed)
Patient contacted the office and stated she made a mistake, she would like these refills to be sent to he mail order pharmacy instead. Please advise, thank you.   [facility] - [place], [place] - [address] Phone: [phone]  Fax: [phone]

## 2022-11-07 MED ORDER — HYDROXYZINE HCL 25 MG PO TABS
25.0000 mg | ORAL_TABLET | Freq: Two times a day (BID) | ORAL | 1 refills | Status: DC | PRN
Start: 2022-11-07 — End: 2023-03-13

## 2022-11-07 MED ORDER — OMEPRAZOLE 40 MG PO CPDR: DELAYED_RELEASE_CAPSULE | ORAL | 1 refills | Status: DC

## 2022-11-07 MED ORDER — SERTRALINE HCL 100 MG PO TABS: 150.0000 mg | ORAL_TABLET | Freq: Every day | ORAL | 1 refills | Status: DC

## 2022-11-07 MED ORDER — GABAPENTIN 100 MG PO CAPS
100.0000 mg | ORAL_CAPSULE | Freq: Two times a day (BID) | ORAL | 1 refills | Status: DC
Start: 2022-11-07 — End: 2023-02-04

## 2022-11-07 MED ORDER — ALBUTEROL SULFATE HFA 108 (90 BASE) MCG/ACT IN AERS
2.0000 | INHALATION_SPRAY | Freq: Four times a day (QID) | RESPIRATORY_TRACT | 1 refills | Status: DC | PRN
Start: 2022-11-07 — End: 2023-01-03

## 2022-11-07 MED ORDER — BENZONATATE 100 MG PO CAPS: ORAL_CAPSULE | ORAL | 1 refills | Status: DC

## 2022-11-07 NOTE — Telephone Encounter (Signed)
ERx 

## 2022-11-12 ENCOUNTER — Other Ambulatory Visit: Payer: Self-pay

## 2022-11-12 ENCOUNTER — Inpatient Hospital Stay (HOSPITAL_COMMUNITY)
Admission: EM | Admit: 2022-11-12 | Discharge: 2022-11-14 | DRG: 871 | Disposition: A | Discharge status: Hospice | Payer: Medicare HMO | Attending: Internal Medicine | Admitting: Internal Medicine

## 2022-11-12 ENCOUNTER — Encounter (HOSPITAL_COMMUNITY): Payer: Self-pay

## 2022-11-12 ENCOUNTER — Emergency Department (HOSPITAL_COMMUNITY): Payer: Medicare HMO

## 2022-11-12 DIAGNOSIS — Z66 Do not resuscitate: Secondary | ICD-10-CM | POA: Diagnosis not present

## 2022-11-12 DIAGNOSIS — E785 Hyperlipidemia, unspecified: Secondary | ICD-10-CM | POA: Diagnosis present

## 2022-11-12 DIAGNOSIS — Z1152 Encounter for screening for COVID-19: Secondary | ICD-10-CM

## 2022-11-12 DIAGNOSIS — J159 Unspecified bacterial pneumonia: Secondary | ICD-10-CM | POA: Diagnosis not present

## 2022-11-12 DIAGNOSIS — J9602 Acute respiratory failure with hypercapnia: Secondary | ICD-10-CM | POA: Diagnosis not present

## 2022-11-12 DIAGNOSIS — Z681 Body mass index (BMI) 19 or less, adult: Secondary | ICD-10-CM | POA: Diagnosis not present

## 2022-11-12 DIAGNOSIS — F411 Generalized anxiety disorder: Secondary | ICD-10-CM | POA: Diagnosis present

## 2022-11-12 DIAGNOSIS — J440 Chronic obstructive pulmonary disease with acute lower respiratory infection: Secondary | ICD-10-CM | POA: Diagnosis present

## 2022-11-12 DIAGNOSIS — I700 Atherosclerosis of aorta: Secondary | ICD-10-CM | POA: Diagnosis not present

## 2022-11-12 DIAGNOSIS — R000 Tachycardia, unspecified: Secondary | ICD-10-CM | POA: Diagnosis not present

## 2022-11-12 DIAGNOSIS — J441 Chronic obstructive pulmonary disease with (acute) exacerbation: Secondary | ICD-10-CM | POA: Diagnosis not present

## 2022-11-12 DIAGNOSIS — J439 Emphysema, unspecified: Secondary | ICD-10-CM | POA: Diagnosis not present

## 2022-11-12 DIAGNOSIS — Z515 Encounter for palliative care: Secondary | ICD-10-CM | POA: Diagnosis not present

## 2022-11-12 DIAGNOSIS — R1084 Generalized abdominal pain: Secondary | ICD-10-CM | POA: Diagnosis not present

## 2022-11-12 DIAGNOSIS — J9622 Acute and chronic respiratory failure with hypercapnia: Secondary | ICD-10-CM | POA: Diagnosis not present

## 2022-11-12 DIAGNOSIS — J90 Pleural effusion, not elsewhere classified: Secondary | ICD-10-CM | POA: Diagnosis not present

## 2022-11-12 DIAGNOSIS — D630 Anemia in neoplastic disease: Secondary | ICD-10-CM | POA: Diagnosis present

## 2022-11-12 DIAGNOSIS — Z7189 Other specified counseling: Secondary | ICD-10-CM | POA: Diagnosis not present

## 2022-11-12 DIAGNOSIS — K5904 Chronic idiopathic constipation: Secondary | ICD-10-CM | POA: Diagnosis present

## 2022-11-12 DIAGNOSIS — R410 Disorientation, unspecified: Secondary | ICD-10-CM | POA: Diagnosis not present

## 2022-11-12 DIAGNOSIS — J449 Chronic obstructive pulmonary disease, unspecified: Secondary | ICD-10-CM | POA: Diagnosis not present

## 2022-11-12 DIAGNOSIS — E876 Hypokalemia: Secondary | ICD-10-CM | POA: Diagnosis present

## 2022-11-12 DIAGNOSIS — Z825 Family history of asthma and other chronic lower respiratory diseases: Secondary | ICD-10-CM

## 2022-11-12 DIAGNOSIS — Z720 Tobacco use: Secondary | ICD-10-CM | POA: Diagnosis present

## 2022-11-12 DIAGNOSIS — Z9981 Dependence on supplemental oxygen: Secondary | ICD-10-CM

## 2022-11-12 DIAGNOSIS — A419 Sepsis, unspecified organism: Principal | ICD-10-CM | POA: Diagnosis present

## 2022-11-12 DIAGNOSIS — I5020 Unspecified systolic (congestive) heart failure: Secondary | ICD-10-CM | POA: Diagnosis present

## 2022-11-12 DIAGNOSIS — Z888 Allergy status to other drugs, medicaments and biological substances status: Secondary | ICD-10-CM

## 2022-11-12 DIAGNOSIS — R0602 Shortness of breath: Secondary | ICD-10-CM | POA: Diagnosis not present

## 2022-11-12 DIAGNOSIS — F332 Major depressive disorder, recurrent severe without psychotic features: Secondary | ICD-10-CM | POA: Diagnosis present

## 2022-11-12 DIAGNOSIS — J9621 Acute and chronic respiratory failure with hypoxia: Secondary | ICD-10-CM | POA: Diagnosis not present

## 2022-11-12 DIAGNOSIS — Z8249 Family history of ischemic heart disease and other diseases of the circulatory system: Secondary | ICD-10-CM

## 2022-11-12 DIAGNOSIS — Z9079 Acquired absence of other genital organ(s): Secondary | ICD-10-CM

## 2022-11-12 DIAGNOSIS — R6520 Severe sepsis without septic shock: Secondary | ICD-10-CM | POA: Diagnosis present

## 2022-11-12 DIAGNOSIS — G9341 Metabolic encephalopathy: Secondary | ICD-10-CM | POA: Diagnosis not present

## 2022-11-12 DIAGNOSIS — Z7401 Bed confinement status: Secondary | ICD-10-CM

## 2022-11-12 DIAGNOSIS — R4182 Altered mental status, unspecified: Secondary | ICD-10-CM | POA: Diagnosis not present

## 2022-11-12 DIAGNOSIS — R627 Adult failure to thrive: Secondary | ICD-10-CM | POA: Diagnosis present

## 2022-11-12 DIAGNOSIS — C3412 Malignant neoplasm of upper lobe, left bronchus or lung: Secondary | ICD-10-CM | POA: Diagnosis present

## 2022-11-12 DIAGNOSIS — R7989 Other specified abnormal findings of blood chemistry: Secondary | ICD-10-CM | POA: Diagnosis not present

## 2022-11-12 DIAGNOSIS — E43 Unspecified severe protein-calorie malnutrition: Secondary | ICD-10-CM | POA: Diagnosis present

## 2022-11-12 DIAGNOSIS — I491 Atrial premature depolarization: Secondary | ICD-10-CM | POA: Diagnosis not present

## 2022-11-12 DIAGNOSIS — J189 Pneumonia, unspecified organism: Secondary | ICD-10-CM | POA: Diagnosis not present

## 2022-11-12 DIAGNOSIS — R0689 Other abnormalities of breathing: Principal | ICD-10-CM

## 2022-11-12 DIAGNOSIS — I2489 Other forms of acute ischemic heart disease: Secondary | ICD-10-CM | POA: Diagnosis present

## 2022-11-12 DIAGNOSIS — Z7952 Long term (current) use of systemic steroids: Secondary | ICD-10-CM

## 2022-11-12 DIAGNOSIS — R1032 Left lower quadrant pain: Secondary | ICD-10-CM | POA: Diagnosis not present

## 2022-11-12 DIAGNOSIS — J9601 Acute respiratory failure with hypoxia: Secondary | ICD-10-CM | POA: Diagnosis present

## 2022-11-12 DIAGNOSIS — F410 Panic disorder [episodic paroxysmal anxiety] without agoraphobia: Secondary | ICD-10-CM | POA: Diagnosis present

## 2022-11-12 DIAGNOSIS — R64 Cachexia: Secondary | ICD-10-CM | POA: Diagnosis present

## 2022-11-12 DIAGNOSIS — Z881 Allergy status to other antibiotic agents status: Secondary | ICD-10-CM

## 2022-11-12 DIAGNOSIS — Z800 Family history of malignant neoplasm of digestive organs: Secondary | ICD-10-CM

## 2022-11-12 DIAGNOSIS — J9611 Chronic respiratory failure with hypoxia: Secondary | ICD-10-CM

## 2022-11-12 DIAGNOSIS — R112 Nausea with vomiting, unspecified: Secondary | ICD-10-CM | POA: Diagnosis present

## 2022-11-12 DIAGNOSIS — Z90721 Acquired absence of ovaries, unilateral: Secondary | ICD-10-CM

## 2022-11-12 DIAGNOSIS — Z79899 Other long term (current) drug therapy: Secondary | ICD-10-CM

## 2022-11-12 DIAGNOSIS — Z8541 Personal history of malignant neoplasm of cervix uteri: Secondary | ICD-10-CM

## 2022-11-12 DIAGNOSIS — F17210 Nicotine dependence, cigarettes, uncomplicated: Secondary | ICD-10-CM | POA: Diagnosis present

## 2022-11-12 DIAGNOSIS — E860 Dehydration: Secondary | ICD-10-CM | POA: Diagnosis not present

## 2022-11-12 LAB — COMPREHENSIVE METABOLIC PANEL
ALT: 21 U/L (ref 0–44)
AST: 41 U/L (ref 15–41)
Albumin: 2.6 g/dL — ABNORMAL LOW (ref 3.5–5.0)
Alkaline Phosphatase: 69 U/L (ref 38–126)
BUN: 18 mg/dL (ref 8–23)
CO2: >45 mmol/L — ABNORMAL HIGH (ref 22–32)
Calcium: 8.9 mg/dL (ref 8.9–10.3)
Chloride: 82 mmol/L — ABNORMAL LOW (ref 98–111)
Creatinine, Ser: 0.55 mg/dL (ref 0.44–1.00)
GFR, Estimated: >60 mL/min (ref >60)
Glucose, Bld: 140 mg/dL — ABNORMAL HIGH (ref 70–99)
Potassium: 3 mmol/L — ABNORMAL LOW (ref 3.5–5.1)
Sodium: 143 mmol/L (ref 135–145)
Total Bilirubin: 1.2 mg/dL (ref 0.3–1.2)
Total Protein: 5.5 g/dL — ABNORMAL LOW (ref 6.5–8.1)

## 2022-11-12 LAB — URINALYSIS, ROUTINE W REFLEX MICROSCOPIC
Bacteria, UA: NONE SEEN
Bilirubin Urine: NEGATIVE
Glucose, UA: NEGATIVE mg/dL
Hgb urine dipstick: NEGATIVE
Ketones, ur: 80 mg/dL — AB
Nitrite: NEGATIVE
Protein, ur: 30 mg/dL — AB
Specific Gravity, Urine: >1.046 — ABNORMAL HIGH (ref 1.005–1.030)
pH: 6 (ref 5.0–8.0)

## 2022-11-12 LAB — CBC
HCT: 34.8 % — ABNORMAL LOW (ref 36.0–46.0)
HCT: 34 % — ABNORMAL LOW (ref 36.0–46.0)
Hemoglobin: 10.6 g/dL — ABNORMAL LOW (ref 12.0–15.0)
Hemoglobin: 10.4 g/dL — ABNORMAL LOW (ref 12.0–15.0)
MCH: 28.8 pg (ref 26.0–34.0)
MCH: 28.5 pg (ref 26.0–34.0)
MCHC: 30.5 g/dL (ref 30.0–36.0)
MCHC: 30.6 g/dL (ref 30.0–36.0)
MCV: 94.6 fL (ref 80.0–100.0)
MCV: 93.2 fL (ref 80.0–100.0)
Platelets: 224 10*3/uL (ref 150–400)
Platelets: 231 10*3/uL (ref 150–400)
RBC: 3.68 MIL/uL — ABNORMAL LOW (ref 3.87–5.11)
RBC: 3.65 MIL/uL — ABNORMAL LOW (ref 3.87–5.11)
RDW: 13.5 % (ref 11.5–15.5)
RDW: 14.2 % (ref 11.5–15.5)
WBC: 10.8 10*3/uL — ABNORMAL HIGH (ref 4.0–10.5)
WBC: 9.3 10*3/uL (ref 4.0–10.5)
nRBC: 0 % (ref 0.0–0.2)
nRBC: 0 % (ref 0.0–0.2)

## 2022-11-12 LAB — LACTIC ACID, PLASMA
Lactic Acid, Venous: 1.4 mmol/L (ref 0.5–1.9)
Lactic Acid, Venous: 1.1 mmol/L (ref 0.5–1.9)

## 2022-11-12 LAB — BLOOD GAS, VENOUS
Acid-Base Excess: 30.9 mmol/L — ABNORMAL HIGH (ref 0.0–2.0)
Bicarbonate: 61 mmol/L — ABNORMAL HIGH (ref 20.0–28.0)
O2 Saturation: 70 %
Patient temperature: 37
pCO2, Ven: 94 mmHg (ref 44–60)
pH, Ven: 7.42 (ref 7.25–7.43)
pO2, Ven: 40 mmHg (ref 32–45)

## 2022-11-12 LAB — TROPONIN I (HIGH SENSITIVITY)
Troponin I (High Sensitivity): 19 ng/L — ABNORMAL HIGH (ref <18)
Troponin I (High Sensitivity): 21 ng/L — ABNORMAL HIGH (ref <18)

## 2022-11-12 LAB — D-DIMER, QUANTITATIVE: D-Dimer, Quant: 1.01 ug/mL-FEU — ABNORMAL HIGH (ref 0.00–0.50)

## 2022-11-12 LAB — SARS CORONAVIRUS 2 BY RT PCR: SARS Coronavirus 2 by RT PCR: NEGATIVE

## 2022-11-12 LAB — CREATININE, SERUM
Creatinine, Ser: 0.44 mg/dL (ref 0.44–1.00)
GFR, Estimated: >60 mL/min (ref >60)

## 2022-11-12 LAB — MRSA NEXT GEN BY PCR, NASAL: MRSA by PCR Next Gen: NOT DETECTED

## 2022-11-12 LAB — STREP PNEUMONIAE URINARY ANTIGEN: Strep Pneumo Urinary Antigen: NEGATIVE

## 2022-11-12 MED ORDER — LACTATED RINGERS IV BOLUS
500.0000 mL | Freq: Once | INTRAVENOUS | Status: AC
  Administered 2022-11-12: 500 mL via INTRAVENOUS

## 2022-11-12 MED ORDER — REVEFENACIN 175 MCG/3ML IN SOLN: 175.0000 ug | Freq: Every day | RESPIRATORY_TRACT | Status: DC

## 2022-11-12 MED ORDER — ONDANSETRON HCL 4 MG/2ML IJ SOLN: 4.0000 mg | Freq: Four times a day (QID) | INTRAMUSCULAR | Status: DC | PRN

## 2022-11-12 MED ORDER — ORAL CARE MOUTH RINSE: 15.0000 mL | OROMUCOSAL | Status: DC | PRN

## 2022-11-12 MED ORDER — GUAIFENESIN ER 600 MG PO TB12
600.0000 mg | ORAL_TABLET | Freq: Two times a day (BID) | ORAL | Status: DC
  Administered 2022-11-12 – 2022-11-14 (×5): 600 mg via ORAL
  Filled 2022-11-12 (×5): qty 1

## 2022-11-12 MED ORDER — ACETAMINOPHEN 325 MG PO TABS: 650.0000 mg | ORAL_TABLET | Freq: Four times a day (QID) | ORAL | Status: DC | PRN

## 2022-11-12 MED ORDER — LEVALBUTEROL HCL 0.63 MG/3ML IN NEBU
0.6300 mg | INHALATION_SOLUTION | Freq: Four times a day (QID) | RESPIRATORY_TRACT | Status: DC
Start: 2022-11-12 — End: 2022-11-12

## 2022-11-12 MED ORDER — DOCUSATE SODIUM 100 MG PO CAPS
100.0000 mg | ORAL_CAPSULE | Freq: Two times a day (BID) | ORAL | Status: DC
  Administered 2022-11-12 – 2022-11-14 (×5): 100 mg via ORAL
  Filled 2022-11-12 (×5): qty 1

## 2022-11-12 MED ORDER — METHYLPREDNISOLONE SODIUM SUCC 125 MG IJ SOLR
125.0000 mg | INTRAMUSCULAR | Status: AC
  Administered 2022-11-12: 125 mg via INTRAVENOUS
  Filled 2022-11-12: qty 2

## 2022-11-12 MED ORDER — IOHEXOL 350 MG/ML SOLN
75.0000 mL | Freq: Once | INTRAVENOUS | Status: AC | PRN
  Administered 2022-11-12: 75 mL via INTRAVENOUS

## 2022-11-12 MED ORDER — ARFORMOTEROL TARTRATE 15 MCG/2ML IN NEBU
15.0000 ug | INHALATION_SOLUTION | Freq: Two times a day (BID) | RESPIRATORY_TRACT | Status: DC
  Administered 2022-11-12 – 2022-11-14 (×5): 15 ug via RESPIRATORY_TRACT
  Filled 2022-11-12 (×6): qty 2

## 2022-11-12 MED ORDER — SODIUM CHLORIDE 0.9 % IV SOLN
1.0000 g | Freq: Once | INTRAVENOUS | Status: AC
  Administered 2022-11-12: 1 g via INTRAVENOUS
  Filled 2022-11-12: qty 10

## 2022-11-12 MED ORDER — BUDESONIDE 0.5 MG/2ML IN SUSP
0.5000 mg | Freq: Two times a day (BID) | RESPIRATORY_TRACT | Status: DC
  Administered 2022-11-12 – 2022-11-14 (×5): 0.5 mg via RESPIRATORY_TRACT
  Filled 2022-11-12 (×6): qty 2

## 2022-11-12 MED ORDER — POTASSIUM CHLORIDE 10 MEQ/100ML IV SOLN
10.0000 meq | Freq: Once | INTRAVENOUS | Status: AC
  Administered 2022-11-12: 10 meq via INTRAVENOUS
  Filled 2022-11-12: qty 100

## 2022-11-12 MED ORDER — HYDRALAZINE HCL 20 MG/ML IJ SOLN: 10.0000 mg | Freq: Three times a day (TID) | INTRAMUSCULAR | Status: DC | PRN

## 2022-11-12 MED ORDER — LEVALBUTEROL HCL 0.63 MG/3ML IN NEBU: 0.6300 mg | INHALATION_SOLUTION | Freq: Four times a day (QID) | RESPIRATORY_TRACT | Status: DC

## 2022-11-12 MED ORDER — ENOXAPARIN SODIUM 30 MG/0.3ML IJ SOSY
30.0000 mg | PREFILLED_SYRINGE | INTRAMUSCULAR | Status: DC
  Administered 2022-11-12 – 2022-11-14 (×3): 30 mg via SUBCUTANEOUS
  Filled 2022-11-12 (×3): qty 0.3

## 2022-11-12 MED ORDER — SODIUM CHLORIDE 0.9 % IV SOLN
500.0000 mg | Freq: Once | INTRAVENOUS | Status: AC
  Administered 2022-11-12: 500 mg via INTRAVENOUS
  Filled 2022-11-12: qty 5

## 2022-11-12 MED ORDER — SODIUM CHLORIDE 0.9 % IV SOLN
1.0000 g | INTRAVENOUS | Status: DC
  Filled 2022-11-12: qty 10

## 2022-11-12 MED ORDER — ONDANSETRON HCL 4 MG PO TABS: 4.0000 mg | ORAL_TABLET | Freq: Four times a day (QID) | ORAL | Status: DC | PRN

## 2022-11-12 MED ORDER — SODIUM CHLORIDE 0.9 % IV SOLN
100.0000 mg | Freq: Two times a day (BID) | INTRAVENOUS | Status: DC
  Administered 2022-11-12 – 2022-11-13 (×2): 100 mg via INTRAVENOUS
  Filled 2022-11-12 (×2): qty 100

## 2022-11-12 MED ORDER — REVEFENACIN 175 MCG/3ML IN SOLN
175.0000 ug | Freq: Every day | RESPIRATORY_TRACT | Status: DC
  Administered 2022-11-13 – 2022-11-14 (×2): 175 ug via RESPIRATORY_TRACT
  Filled 2022-11-12 (×2): qty 3

## 2022-11-12 MED ORDER — SODIUM CHLORIDE 0.9 % IV SOLN: INTRAVENOUS | Status: DC

## 2022-11-12 MED ORDER — ACETAMINOPHEN 650 MG RE SUPP: 650.0000 mg | Freq: Four times a day (QID) | RECTAL | Status: DC | PRN

## 2022-11-12 MED ORDER — SODIUM CHLORIDE 0.9 % IV BOLUS
1000.0000 mL | Freq: Once | INTRAVENOUS | Status: AC
  Administered 2022-11-12: 1000 mL via INTRAVENOUS

## 2022-11-12 MED ORDER — METHYLPREDNISOLONE SODIUM SUCC 125 MG IJ SOLR: 125.0000 mg | Freq: Every day | INTRAMUSCULAR | Status: DC

## 2022-11-12 NOTE — Progress Notes (Signed)
eLink Physician-Brief Progress Note Patient Name: Christine Serrano DOB: 03-May-1961 MRN: [id number]   Date of Service  11/12/2022  HPI/Events of Note  62 year old female smoker with history of COPD admitted to ICU for COPD exacerbation on noninvasive ventilation.  eICU Interventions  Patient seen in ICU.  Currently off BiPAP.  Continue IV steroids and inhaled bronchodilators.  On empiric antibiotic for COPD exacerbation.     Intervention Category Evaluation Type: New Patient Evaluation  Carilyn Goodpasture 11/12/2022, 8:11 PM

## 2022-11-12 NOTE — Progress Notes (Signed)
eLink Physician-Brief Progress Note Patient Name: Christine Serrano DOB: 04-Jul-1960 MRN: [id number]   Date of Service  11/12/2022  HPI/Events of Note  Patient is hypotensive with blood pressure 77/61.  Mental status is unchanged.  eICU Interventions  Will give a one-time fluid bolus of 500 cc of lactated Ringer solution.     Intervention Category Intermediate Interventions: Hypotension - evaluation and management  Carilyn Goodpasture 11/12/2022, 11:21 PM

## 2022-11-12 NOTE — Progress Notes (Signed)
RT NOTE:  Pt placed on BiPAP per EDP order.  

## 2022-11-12 NOTE — ED Triage Notes (Signed)
Patient brought in by EMS due to increase in shortness of breath and abdominal pain with diarrhea X 2 days. Pt has history of COPD. Pt received 1L NS. Pt HR in the 120's after 1L of fluids.

## 2022-11-12 NOTE — Consult Note (Signed)
NAME:  Christine Serrano, MRN:  [id number], DOB:  1960/07/11, LOS: 0 ADMISSION DATE:  11/12/2022, CONSULTATION DATE:  11/12/22 REFERRING MD:  Idelle Leech, CHIEF COMPLAINT:  SOB   History of Present Illness:  62 year old woman w/ hx of COPD on HOT, FTT, weight loss, pulmonary cachexia, active smoking presenting with increasing SOB, cough.  She's not the best historian but from what I can tell this started a couple days ago.  +wheezing.  Nonproductive cough.  CTA chest showing possible lung ca vs. PNA, associated small effusion.  She was started on BIPAP.  PCCM consulted.  Pertinent  Medical History  COPD on HOT Pulmonary cachexia   Significant Hospital Events: Including procedures, antibiotic start and stop dates in addition to other pertinent events     Interim History / Subjective:  consult  Objective   Blood pressure 95/75, pulse (!) 135, temperature 98 F (36.7 C), resp. rate 18, height 5\' 5"  (1.651 m), weight 37.2 kg, SpO2 100 %.    FiO2 (%):  [40 %] 40 %   Intake/Output Summary (Last 24 hours) at 11/12/2022 1718 Last data filed at 11/12/2022 1701 Gross per 24 hour  Intake 1450 ml  Output --  Net 1450 ml   Filed Weights   11/12/22 1243  Weight: 37.2 kg    Examination: General: chronically ill appearing HENT: her dentures are falling out on BIPAP, this was remedied Lungs: diminished with wheezing bilaterally, no distress Cardiovascular: tachy, ext warm Abdomen: soft, +BS Extremities: no edema Neuro: Moves to command Psych: Aox3 for me  Chronic hypercapnea noted CTA reviewed, acute on chronic bi-apical scarring with posssible superimposed infiltrate vs. PNA L>R  Resolved Hospital Problem list   N/A  Assessment & Plan:  Acute on chronic hypoxemic and hypercarbic respiratory failure due to AECOPD, question LUL community acquired pneumonia vs. LUL lung mass - Okay for BIPAP PRN - LABA/LAMA/ICS nebs - CAP coverage, steroids - Palliative consult, she would not tolerate  a bronch well; reconsideration of hospice may be in order - Should probably stop smoking but she has end stage disease at this point - Depending on GOC, will arrange OP f/u; not sure what to do if this is a confirmed lung mass  Best Practice (right click and "Reselect all SmartList Selections" daily)  Per primary  Labs   CBC: Recent Labs  Lab 11/12/22 1302  WBC 10.8*  HGB 10.6*  HCT 34.8*  MCV 94.6  PLT 224    Basic Metabolic Panel: Recent Labs  Lab 11/12/22 1302  NA 143  K 3.0*  CL 82*  CO2 >45*  GLUCOSE 140*  BUN 18  CREATININE 0.55  CALCIUM 8.9   GFR: Estimated Creatinine Clearance: 42.3 mL/min (by C-G formula based on SCr of 0.55 mg/dL). Recent Labs  Lab 11/12/22 1302 11/12/22 1502  WBC 10.8*  --   LATICACIDVEN 1.4 1.1    Liver Function Tests: Recent Labs  Lab 11/12/22 1302  AST 41  ALT 21  ALKPHOS 69  BILITOT 1.2  PROT 5.5*  ALBUMIN 2.6*   No results for input(s): "LIPASE", "AMYLASE" in the last 168 hours. No results for input(s): "AMMONIA" in the last 168 hours.  ABG    Component Value Date/Time   PHART 7.371 05/16/2019 0205   PCO2ART 52.1 (H) 05/16/2019 0205   PO2ART 192 (H) 05/16/2019 0205   HCO3 61.0 (H) 11/12/2022 1332   TCO2 25.9 04/04/2009 1430   O2SAT 70 11/12/2022 1332     Coagulation Profile:  No results for input(s): "INR", "PROTIME" in the last 168 hours.  Cardiac Enzymes: No results for input(s): "CKTOTAL", "CKMB", "CKMBINDEX", "TROPONINI" in the last 168 hours.  HbA1C: No results found for: "HGBA1C"  CBG: No results for input(s): "GLUCAP" in the last 168 hours.  Review of Systems:    Positive Symptoms in bold:  Constitutional fevers, chills, weight loss, fatigue, anorexia, malaise  Eyes decreased vision, double vision, eye irritation  Ears, Nose, Mouth, Throat sore throat, trouble swallowing, sinus congestion  Cardiovascular chest pain, paroxysmal nocturnal dyspnea, lower ext edema, palpitations   Respiratory  SOB, cough, DOE, hemoptysis, wheezing  Gastrointestinal nausea, vomiting, diarrhea  Genitourinary burning with urination, trouble urinating  Musculoskeletal joint aches, joint swelling, back pain  Integumentary  rashes, skin lesions  Neurological focal weakness, focal numbness, trouble speaking, headaches  Psychiatric depression, anxiety, confusion  Endocrine polyuria, polydipsia, cold intolerance, heat intolerance  Hematologic abnormal bruising, abnormal bleeding, unexplained nose bleeds  Allergic/Immunologic recurrent infections, hives, swollen lymph nodes     Past Medical History:  She,  has a past medical history of Cervical cancer (HCC), Chronic idiopathic constipation, Community acquired pneumonia of right upper lobe of lung (12/10/2016), COPD (chronic obstructive pulmonary disease) (HCC), Depression, Endometriosis, HLD (hyperlipidemia), and Smoker.   Surgical History:   Past Surgical History:  Procedure Laterality Date   ABLATION ON ENDOMETRIOSIS     APPENDECTOMY     BREAST EXCISIONAL BIOPSY Left age 39   benign biopsy - chronic fatty deposit   COLONOSCOPY WITH PROPOFOL N/A 08/04/2017   TAx3, angiodysplastic lesion, diverticulosis, rpt 5 yrs ([facility])   EXPLORATORY LAPAROTOMY  1992   endometriosis   FOOT SURGERY Right    needle imbeded   LASER ABLATION CONDYLOMA CERVICAL / VULVAR     NASAL SEPTUM SURGERY Bilateral    OVARIAN CYST SURGERY Bilateral 1980   remote   SALPINGOOPHORECTOMY Right 1983   ectopic pregnancy   TONSILLECTOMY       Social History:   reports that she has been smoking cigarettes. She has a 51.00 pack-year smoking history. She has never used smokeless tobacco. She reports current alcohol use. She reports that she does not use drugs.   Family History:  Her family history includes COPD in her father; Colon cancer in her paternal grandmother; Hearing loss in her maternal grandfather and maternal grandmother; Heart disease in her mother; Hypertension in  her father, maternal grandfather, maternal grandmother, and mother.   Allergies Allergies  Allergen Reactions   Terbinafine And Related Diarrhea    Diarrheal illness while on terbinafine ?related to med   Clarithromycin     Caused Thrush    Imitrex [Sumatriptan] Other (See Comments)    Worsens migraine      Home Medications  Prior to Admission medications   Medication Sig Start Date End Date Taking? Authorizing Provider  albuterol (PROAIR HFA) 108 (90 Base) MCG/ACT inhaler Inhale 2 puffs into the lungs every 6 (six) hours as needed for wheezing or shortness of breath. 11/07/22   Eustaquio Boyden, MD  albuterol (PROVENTIL) (2.5 MG/3ML) 0.083% nebulizer solution INHALE CONTENTS OF 1 VIAL VIA NEBULIZER EVERY 6 HOURS AS NEEDED FOR WHEEZE OR SHORTNESS OF BREATH 08/28/22   Salena Saner, MD  benzonatate (TESSALON) 100 MG capsule TAKE 1 CAPSULE BY MOUTH THREE TIMES A DAY AS NEEDED FOR COUGH. NOT COVERED BY INSURANCE. 11/07/22   Eustaquio Boyden, MD  BREZTRI AEROSPHERE 160-9-4.8 MCG/ACT AERO INHALE 2 PUFFS INTO THE LUNGS IN THE MORNING AND AT BEDTIME. 08/12/22  Salena Saner, MD  buPROPion (WELLBUTRIN XL) 150 MG 24 hr tablet Take 1 tablet (150 mg total) by mouth daily. 12/12/21   Eustaquio Boyden, MD  cholecalciferol (VITAMIN D3) 25 MCG (1000 UNIT) tablet Take 1,000 Units by mouth daily.    [provider]  fluticasone (FLONASE) 50 MCG/ACT nasal spray Place 2 sprays into both nostrils daily. 06/06/20   Eustaquio Boyden, MD  folic acid (FOLVITE) 1 MG tablet Take 1 tablet (1 mg total) by mouth daily. 12/12/21   Eustaquio Boyden, MD  gabapentin (NEURONTIN) 100 MG capsule Take 1 capsule (100 mg total) by mouth 2 (two) times daily. 11/07/22   Eustaquio Boyden, MD  hydrOXYzine (ATARAX) 25 MG tablet Take 1 tablet (25 mg total) by mouth 2 (two) times daily as needed for anxiety. 11/07/22   Eustaquio Boyden, MD  Iron, Ferrous Sulfate, 325 (65 Fe) MG TABS Take 325 mg by mouth daily.  06/29/21   Eustaquio Boyden, MD  LINZESS 290 MCG CAPS capsule Take 1 capsule (290 mcg total) by mouth daily as needed (constipation). TAKE 1 CAPSULE EVERY DAY AS NEEDED FOR CONSTIPATION AS DIRECTED 05/07/21   Eustaquio Boyden, MD  loratadine (CLARITIN) 10 MG tablet TAKE 1 TABLET BY MOUTH EVERY DAY AS NEEDED 09/16/22   Eustaquio Boyden, MD  meclizine (ANTIVERT) 25 MG tablet Take 1 tablet (25 mg total) by mouth 3 (three) times daily as needed for dizziness. 05/07/21   Eustaquio Boyden, MD  methocarbamol (ROBAXIN) 500 MG tablet TAKE 1 TABLET BY MOUTH 3 TIMES DAILY AS NEEDED FOR MUSCLE SPASMS (SEDATION PRECAUTIONS). 05/07/21   Eustaquio Boyden, MD  Multiple Vitamin (MULTIVITAMIN WITH MINERALS) TABS tablet Take 1 tablet by mouth daily. 09/12/19   Glade Lloyd, MD  naproxen (NAPROSYN) 375 MG tablet TAKE 1 TABLET BY MOUTH TWICE  DAILY AS NEEDED FOR PAIN WITH  FOOD 12/05/21   Eustaquio Boyden, MD  omeprazole (PRILOSEC) 40 MG capsule TAKE 1 CAPSULE BY MOUTH DAILY. FOR THREE WEEKS THEN AS NEEDED. TAKE 30 MIN PRIOR TO LARGE MEAL 11/07/22   Eustaquio Boyden, MD  OXYGEN Inhale 4 L/min into the lungs continuous.     [provider]  Potassium 99 MG TABS Take by mouth.    [provider]  predniSONE (DELTASONE) 5 MG tablet TAKE 2 TABLETS BY MOUTH DAILY WITH BREAKFAST. 08/12/22   Salena Saner, MD  sertraline (ZOLOFT) 100 MG tablet Take 1.5 tablets (150 mg total) by mouth daily. 11/07/22   Eustaquio Boyden, MD  thiamine 100 MG tablet Take 1 tablet (100 mg total) by mouth daily. 09/12/19   Glade Lloyd, MD  traZODone (DESYREL) 50 MG tablet TAKE 1 TABLET BY MOUTH EVERYDAY AT BEDTIME 09/17/22   Eustaquio Boyden, MD  vitamin B-12 (CYANOCOBALAMIN) 1000 MCG tablet Take 1 tablet (1,000 mcg total) by mouth daily. 06/06/20   Eustaquio Boyden, MD     Critical care time: N/A

## 2022-11-12 NOTE — H&P (Addendum)
History and Physical    Christine Serrano:[id number] DOB: 01/05/1961 DOA: 11/12/2022  PCP: Eustaquio Boyden, MD   Patient coming from: Home  I have personally briefly reviewed patient's old medical records in [facility]  Chief Complaint: Progressive shortness of breath with cough and subjective fever.  HPI: Christine Serrano is a 62 y.o. female with PMH significant for chronic idiopathic constipation, COPD, chronic hypoxic respiratory failure on 4 L of supplemental oxygen at baseline, depression, hyperlipidemia, ongoing tobacco use presented in the ED with progressive shortness of breath associated with productive cough and subjective fever for last 1 week.  Patient reports she was admitted for same symptoms and was discharged with antibiotics, but there is no documentation noted on care everywhere.  Patient uses 4 L of supplemental oxygen at baseline and states for last 1 week she does not feel well and has been requiring more oxygen.  She has the feeling that she could not breathe.  She reports subjective fevers but has not checked her temperature at home.  She continues to smoke despite multiple efforts by her primary care physician to quit smoking.  Last smoking was yesterday.  ED Course: Patient was tachycardic, tachypneic, hypoxic, hypercapnic requiring BiPAP, hypotensive, afebrile. HR 133, RR 22, BP 91/62, SpO2 100% on BiPAP, temp 98.3. Labs include sodium 143, potassium 3.0, chloride 82, bicarb more than 45, glucose 140, BUN 18, creatinine 0.55, calcium 8.9, alkaline phosphatase 69, albumin 2.6, AST 41, ALT 21, total protein 5.5, total bilirubin 1.2, BNP pending, troponIN 19 >21, lactic acid 1.4, WBC 10.8, hemoglobin 10.6, hematocrit 34.8, MCV 94.6, platelet 224, D-dimer 1.01, CT abdomen and pelvis showed no acute finding to explain the clinical presentation. CTA chest ruled out pulmonary embolism but showed 2.7x 2.4 cm mass is noted in the left upper lobe concerning for malignancy.   Bilateral upper lobe opacities possibly infiltrates.  Small pleural effusion. CT head no acute intracranial mass hemorrhage or edema.  Patient admitted for acute on chronic hypoxic and hypercapnic respiratory failure requiring BiPAP, secondary to bilateral pneumonia, new lung mass and COPD exacerbation.  Review of Systems:   Review of Systems  Constitutional:  Positive for chills, fever and malaise/fatigue.  HENT:  Positive for congestion.   Eyes: Negative.   Respiratory:  Positive for cough, sputum production, shortness of breath and wheezing.   Cardiovascular: Negative.   Gastrointestinal:  Positive for abdominal pain, diarrhea, nausea and vomiting.  Genitourinary: Negative.   Musculoskeletal: Negative.   Skin: Negative.   Neurological:  Positive for weakness.  Endo/Heme/Allergies: Negative.   Psychiatric/Behavioral:  Positive for depression.     Past Medical History:  Diagnosis Date   Cervical cancer (HCC)    Chronic idiopathic constipation    Community acquired pneumonia of right upper lobe of lung 12/10/2016   COPD (chronic obstructive pulmonary disease) (HCC)    Depression    Endometriosis    HLD (hyperlipidemia)    Smoker     Past Surgical History:  Procedure Laterality Date   ABLATION ON ENDOMETRIOSIS     APPENDECTOMY     BREAST EXCISIONAL BIOPSY Left age 57   benign biopsy - chronic fatty deposit   COLONOSCOPY WITH PROPOFOL N/A 08/04/2017   TAx3, angiodysplastic lesion, diverticulosis, rpt 5 yrs ([facility])   EXPLORATORY LAPAROTOMY  1992   endometriosis   FOOT SURGERY Right    needle imbeded   LASER ABLATION CONDYLOMA CERVICAL / VULVAR     NASAL SEPTUM SURGERY Bilateral    OVARIAN  CYST SURGERY Bilateral 1980   remote   SALPINGOOPHORECTOMY Right 1983   ectopic pregnancy   TONSILLECTOMY       reports that she has been smoking cigarettes. She has a 51.00 pack-year smoking history. She has never used smokeless tobacco. She reports current alcohol use. She  reports that she does not use drugs.  Allergies  Allergen Reactions   Terbinafine And Related Diarrhea    Diarrheal illness while on terbinafine ?related to med   Clarithromycin     Caused Thrush    Imitrex [Sumatriptan] Other (See Comments)    Worsens migraine     Family History  Problem Relation Age of Onset   Heart disease Mother    Hypertension Mother    Hypertension Father    COPD Father    Hearing loss Maternal Grandmother    Hypertension Maternal Grandmother    Hearing loss Maternal Grandfather    Hypertension Maternal Grandfather    Colon cancer Paternal Grandmother    Family history reviewed and not pertinent.  Prior to Admission medications   Medication Sig Start Date End Date Taking? Authorizing Provider  albuterol (PROAIR HFA) 108 (90 Base) MCG/ACT inhaler Inhale 2 puffs into the lungs every 6 (six) hours as needed for wheezing or shortness of breath. 11/07/22   Eustaquio Boyden, MD  albuterol (PROVENTIL) (2.5 MG/3ML) 0.083% nebulizer solution INHALE CONTENTS OF 1 VIAL VIA NEBULIZER EVERY 6 HOURS AS NEEDED FOR WHEEZE OR SHORTNESS OF BREATH 08/28/22   Salena Saner, MD  benzonatate (TESSALON) 100 MG capsule TAKE 1 CAPSULE BY MOUTH THREE TIMES A DAY AS NEEDED FOR COUGH. NOT COVERED BY INSURANCE. 11/07/22   Eustaquio Boyden, MD  BREZTRI AEROSPHERE 160-9-4.8 MCG/ACT AERO INHALE 2 PUFFS INTO THE LUNGS IN THE MORNING AND AT BEDTIME. 08/12/22   Salena Saner, MD  buPROPion (WELLBUTRIN XL) 150 MG 24 hr tablet Take 1 tablet (150 mg total) by mouth daily. 12/12/21   Eustaquio Boyden, MD  cholecalciferol (VITAMIN D3) 25 MCG (1000 UNIT) tablet Take 1,000 Units by mouth daily.    [provider]  fluticasone (FLONASE) 50 MCG/ACT nasal spray Place 2 sprays into both nostrils daily. 06/06/20   Eustaquio Boyden, MD  folic acid (FOLVITE) 1 MG tablet Take 1 tablet (1 mg total) by mouth daily. 12/12/21   Eustaquio Boyden, MD  gabapentin (NEURONTIN) 100 MG capsule Take 1  capsule (100 mg total) by mouth 2 (two) times daily. 11/07/22   Eustaquio Boyden, MD  hydrOXYzine (ATARAX) 25 MG tablet Take 1 tablet (25 mg total) by mouth 2 (two) times daily as needed for anxiety. 11/07/22   Eustaquio Boyden, MD  Iron, Ferrous Sulfate, 325 (65 Fe) MG TABS Take 325 mg by mouth daily. 06/29/21   Eustaquio Boyden, MD  LINZESS 290 MCG CAPS capsule Take 1 capsule (290 mcg total) by mouth daily as needed (constipation). TAKE 1 CAPSULE EVERY DAY AS NEEDED FOR CONSTIPATION AS DIRECTED 05/07/21   Eustaquio Boyden, MD  loratadine (CLARITIN) 10 MG tablet TAKE 1 TABLET BY MOUTH EVERY DAY AS NEEDED 09/16/22   Eustaquio Boyden, MD  meclizine (ANTIVERT) 25 MG tablet Take 1 tablet (25 mg total) by mouth 3 (three) times daily as needed for dizziness. 05/07/21   Eustaquio Boyden, MD  methocarbamol (ROBAXIN) 500 MG tablet TAKE 1 TABLET BY MOUTH 3 TIMES DAILY AS NEEDED FOR MUSCLE SPASMS (SEDATION PRECAUTIONS). 05/07/21   Eustaquio Boyden, MD  Multiple Vitamin (MULTIVITAMIN WITH MINERALS) TABS tablet Take 1 tablet by mouth daily. 09/12/19  Glade Lloyd, MD  naproxen (NAPROSYN) 375 MG tablet TAKE 1 TABLET BY MOUTH TWICE  DAILY AS NEEDED FOR PAIN WITH  FOOD 12/05/21   Eustaquio Boyden, MD  omeprazole (PRILOSEC) 40 MG capsule TAKE 1 CAPSULE BY MOUTH DAILY. FOR THREE WEEKS THEN AS NEEDED. TAKE 30 MIN PRIOR TO LARGE MEAL 11/07/22   Eustaquio Boyden, MD  OXYGEN Inhale 4 L/min into the lungs continuous.     [provider]  Potassium 99 MG TABS Take by mouth.    [provider]  predniSONE (DELTASONE) 5 MG tablet TAKE 2 TABLETS BY MOUTH DAILY WITH BREAKFAST. 08/12/22   Salena Saner, MD  sertraline (ZOLOFT) 100 MG tablet Take 1.5 tablets (150 mg total) by mouth daily. 11/07/22   Eustaquio Boyden, MD  thiamine 100 MG tablet Take 1 tablet (100 mg total) by mouth daily. 09/12/19   Glade Lloyd, MD  traZODone (DESYREL) 50 MG tablet TAKE 1 TABLET BY MOUTH EVERYDAY AT BEDTIME 09/17/22    Eustaquio Boyden, MD  vitamin B-12 (CYANOCOBALAMIN) 1000 MCG tablet Take 1 tablet (1,000 mcg total) by mouth daily. 06/06/20   Eustaquio Boyden, MD    Physical Exam: Vitals:   11/12/22 1417 11/12/22 1530 11/12/22 1630 11/12/22 1700  BP:  98/72 104/83 95/75  Pulse: (!) 133 (!) 118 (!) 123 (!) 135  Resp: 18 18 17 18   Temp:    98 F (36.7 C)  TempSrc:      SpO2: 100% 100% 100% 100%  Weight:      Height:        Constitutional: Appears comfortable, remains on BiPAP, not in any acute distress. Vitals:   11/12/22 1417 11/12/22 1530 11/12/22 1630 11/12/22 1700  BP:  98/72 104/83 95/75  Pulse: (!) 133 (!) 118 (!) 123 (!) 135  Resp: 18 18 17 18   Temp:    98 F (36.7 C)  TempSrc:      SpO2: 100% 100% 100% 100%  Weight:      Height:       Eyes: PERRL, lids and conjunctivae normal ENMT: Mucous membranes are moist.  Posterior pharynx without exudate. Neck: normal, supple, no masses, no thyromegaly Respiratory: Wheezing bilaterally, no crackles, normal respiratory effort, no accessory muscle use, RR 18 Cardiovascular: S1-S2 heard, regular rate and rhythm, no murmur.  No edema.  Abdomen: Abdomen is soft, nontender, nondistended, bowel sounds present Musculoskeletal: No edema, no cyanosis, no clubbing.  Good ROM, no contractures. Normal muscle tone.  Skin: no rashes, lesions, ulcers. No induration Neurologic: CN 2-12 grossly intact. Sensation intact, DTR normal. Strength 5/5 in all 4.  Psychiatric: Normal judgment and insight. Alert and oriented x 3. Normal mood.    Labs on Admission: I have personally reviewed following labs and imaging studies  CBC: Recent Labs  Lab 11/12/22 1302  WBC 10.8*  HGB 10.6*  HCT 34.8*  MCV 94.6  PLT 224   Basic Metabolic Panel: Recent Labs  Lab 11/12/22 1302  NA 143  K 3.0*  CL 82*  CO2 >45*  GLUCOSE 140*  BUN 18  CREATININE 0.55  CALCIUM 8.9   GFR: Estimated Creatinine Clearance: 42.3 mL/min (by C-G formula based on SCr of 0.55  mg/dL). Liver Function Tests: Recent Labs  Lab 11/12/22 1302  AST 41  ALT 21  ALKPHOS 69  BILITOT 1.2  PROT 5.5*  ALBUMIN 2.6*   No results for input(s): "LIPASE", "AMYLASE" in the last 168 hours. No results for input(s): "AMMONIA" in the last 168 hours. Coagulation  Profile: No results for input(s): "INR", "PROTIME" in the last 168 hours. Cardiac Enzymes: No results for input(s): "CKTOTAL", "CKMB", "CKMBINDEX", "TROPONINI" in the last 168 hours. BNP (last 3 results) No results for input(s): "PROBNP" in the last 8760 hours. HbA1C: No results for input(s): "HGBA1C" in the last 72 hours. CBG: No results for input(s): "GLUCAP" in the last 168 hours. Lipid Profile: No results for input(s): "CHOL", "HDL", "LDLCALC", "TRIG", "CHOLHDL", "LDLDIRECT" in the last 72 hours. Thyroid Function Tests: No results for input(s): "TSH", "T4TOTAL", "FREET4", "T3FREE", "THYROIDAB" in the last 72 hours. Anemia Panel: No results for input(s): "VITAMINB12", "FOLATE", "FERRITIN", "TIBC", "IRON", "RETICCTPCT" in the last 72 hours. Urine analysis:    Component Value Date/Time   COLORURINE STRAW (A) 10/17/2020 1838   APPEARANCEUR CLEAR 10/17/2020 1838   LABSPEC 1.002 (L) 10/17/2020 1838   PHURINE 6.0 10/17/2020 1838   GLUCOSEU NEGATIVE 10/17/2020 1838   HGBUR SMALL (A) 10/17/2020 1838   BILIRUBINUR NEGATIVE 10/17/2020 1838   KETONESUR NEGATIVE 10/17/2020 1838   PROTEINUR NEGATIVE 10/17/2020 1838   NITRITE NEGATIVE 10/17/2020 1838   LEUKOCYTESUR SMALL (A) 10/17/2020 1838    Radiological Exams on Admission: DG Chest 2 View  Result Date: 11/12/2022 CLINICAL DATA:  Rule out pneumonia EXAM: CHEST - 2 VIEW COMPARISON:  Chest x-ray dated 172023. FINDINGS: Ill-defined airspace opacities within the upper lobes bilaterally. Coarse lung markings are again seen bilaterally indicating underlying chronic interstitial lung disease/fibrosis. No pneumothorax is seen. Probable small LEFT pleural effusion. Heart  size and mediastinal contours are stable. No acute-appearing osseous abnormality. IMPRESSION: 1. Bilateral upper lobe pneumonia superimposed on chronic interstitial lung disease/fibrosis. 2. Probable small LEFT pleural effusion. Electronically Signed   By: Bary Richard M.D.   On: 11/12/2022 14:07   CT Angio Chest PE W and/or Wo Contrast  Result Date: 11/12/2022 CLINICAL DATA:  Shortness of breath. EXAM: CT ANGIOGRAPHY CHEST WITH CONTRAST TECHNIQUE: Multidetector CT imaging of the chest was performed using the standard protocol during bolus administration of intravenous contrast. Multiplanar CT image reconstructions and MIPs were obtained to evaluate the vascular anatomy. RADIATION DOSE REDUCTION: This exam was performed according to the departmental dose-optimization program which includes automated exposure control, adjustment of the mA and/or kV according to patient size and/or use of iterative reconstruction technique. CONTRAST:  75mL OMNIPAQUE IOHEXOL 350 MG/ML SOLN COMPARISON:  June 07, 2020.  June 30, 2020. FINDINGS: Cardiovascular: Satisfactory opacification of the pulmonary arteries to the segmental level. No evidence of pulmonary embolism. Normal heart size. No pericardial effusion. Mediastinum/Nodes: No enlarged mediastinal, hilar, or axillary lymph nodes. Thyroid gland, trachea, and esophagus demonstrate no significant findings. Lungs/Pleura: No pneumothorax is noted. Small left pleural effusion is noted. Emphysematous disease is noted bilaterally. Probable 2.7 x 2.4 cm mass is noted medially in left upper lobe. Bilateral upper lobe opacities are noted concerning for atelectasis or possibly infiltrates. Upper Abdomen: No acute abnormality. Musculoskeletal: No chest wall abnormality. No acute or significant osseous findings. Review of the MIP images confirms the above findings. IMPRESSION: No definite evidence of pulmonary embolus. Probable 2.7 x 2.4 cm mass is noted medially in left upper lobe  concerning for malignancy. PET scan is recommended for further evaluation. Bilateral upper lobe opacities are noted concerning for atelectasis or possibly infiltrates. Small left pleural effusion. Aortic Atherosclerosis (ICD10-I70.0) and Emphysema (ICD10-J43.9). Electronically Signed   By: Lupita Raider M.D.   On: 11/12/2022 14:07   CT Head Wo Contrast  Result Date: 11/12/2022 CLINICAL DATA:  Mental status change. EXAM:  CT HEAD WITHOUT CONTRAST TECHNIQUE: Contiguous axial images were obtained from the base of the skull through the vertex without intravenous contrast. RADIATION DOSE REDUCTION: This exam was performed according to the departmental dose-optimization program which includes automated exposure control, adjustment of the mA and/or kV according to patient size and/or use of iterative reconstruction technique. COMPARISON:  Head CT dated 10/17/2020 FINDINGS: Brain: No hydrocephalus. No mass, hemorrhage, edema or other evidence of acute parenchymal abnormality. No extra-axial hemorrhage. Vascular: Chronic calcified atherosclerotic changes of the large vessels at the skull base. No unexpected hyperdense vessel. Skull: Normal. Negative for fracture or focal lesion. Sinuses/Orbits: Visualized paranasal sinuses are clear. Visualized periorbital and retro-orbital soft tissues are unremarkable. Other: None. IMPRESSION: No acute findings. No intracranial mass, hemorrhage or edema. Electronically Signed   By: Bary Richard M.D.   On: 11/12/2022 14:03   CT ABDOMEN PELVIS W CONTRAST  Result Date: 11/12/2022 CLINICAL DATA:  Left lower quadrant abdominal pain. Shortness of breath. Diarrhea. EXAM: CT ABDOMEN AND PELVIS WITH CONTRAST TECHNIQUE: Multidetector CT imaging of the abdomen and pelvis was performed using the standard protocol following bolus administration of intravenous contrast. RADIATION DOSE REDUCTION: This exam was performed according to the departmental dose-optimization program which includes  automated exposure control, adjustment of the mA and/or kV according to patient size and/or use of iterative reconstruction technique. CONTRAST:  75mL OMNIPAQUE IOHEXOL 350 MG/ML SOLN COMPARISON:  None Available. FINDINGS: Lower chest: Scarring and emphysematous change noted at the lung bases. Hepatobiliary: Diffuse mild fatty change of the liver. No focal lesion. No calcified gallstones. Pancreas: Normal Spleen: Normal Adrenals/Urinary Tract: Adrenal glands are normal. Kidneys are normal. No stone, mass or hydronephrosis. Bladder is normal. Stomach/Bowel: Stomach and small intestine are normal. Appendix is not visualized. Right: And transverse colon appear normal. Left colon is normal. No evidence of diverticulosis or diverticulitis. Vascular/Lymphatic: Aortic atherosclerosis, advanced. Severe stenotic disease at the distal aorta/proximal common iliac arteries. Reproductive: Calcified leiomyoma.  No other pelvic mass or finding. Other: No free fluid or air. Musculoskeletal: Chronic lumbar degenerative changes. IMPRESSION: 1. No acute finding to explain the clinical presentation. No evidence of diverticulosis or diverticulitis. 2. Diffuse mild fatty change of the liver. 3. Advanced aortic atherosclerosis. Severe stenotic disease at the distal aorta/proximal common iliac arteries. Aortic Atherosclerosis (ICD10-I70.0). Electronically Signed   By: Paulina Fusi M.D.   On: 11/12/2022 14:01    EKG: Independently reviewed.  Sinus tachycardia, right axis deviation  Assessment/Plan Principal Problem:   Acute respiratory failure with hypoxia and hypercapnia (HCC) Active Problems:   MDD (major depressive disorder), recurrent episode, severe (HCC)   COPD, very severe (HCC)   Tobacco abuse   Generalized anxiety disorder with panic attacks   Community acquired pneumonia   HLD (hyperlipidemia)   Chronic respiratory failure with hypoxia, on home O2 therapy (HCC)  # Acute on chronic hypoxic and hypercapnic  respiratory failure: # Acute metabolic encephalopathy in the setting of hypoxia: # Suspected sepsis due to pneumonia: Patient presented with productive cough, subjective fever, shortness of breath, confusion in the setting of chronic oxygen use and ongoing tobacco use.  She is found to be hypoxic and hypercapnic requiring BiPAP on arrival. Sepsis markers (tachycardia, tachypnea, hypoxia, Hypotension,  confusion, leukocytosis, pneumonia on chest x-ray, lactic acid 1.1) Likely multifactorial, COPD exacerbation, bilateral pneumonia, new lung mass, Ongoing tobacco use CTA chest ruled out pulmonary embolism but showed bilateral pneumonia and lung mass. Continue BiPAP for now, and as needed.  Wean as tolerated. Started on empiric  antibiotics (Ceftriaxone and doxycycline). Continue IV Solu-Medrol 60 mg every 12 hours. Continue nebulized Xopenex for wheezing. Continue gentle IV hydration. Follow-up influenza, COVID, respiratory viral panel. She appears back to her baseline mental status after use of BiPAP.  COPD exacerbation: Continue IV Solu-Medrol 60 mg IV every 12 hours.   Continue BiPAP nightly and as needed. Continue Xopenex nebulization every 6 hours as needed.  Bilateral pneumonia: Continue ceftriaxone and doxycycline for 5 days. Continue supplemental oxygen and wean as tolerated.  Lung mass suspicious for malignancy: Pulmonology consulted.  CT chest shows new lung mass. Patient will probably need lung biopsy or further workup.  Hypokalemia: Replaced.  Continue to monitor  Elevated troponin: Patient remains confused, denies any chest pain. Likely demand ischemia in the setting of acute on chronic hypoxic respiratory failure. Obtain 2D echo  Nausea and vomiting: Continue IV Zofran as needed. Continue IV fluid resuscitation.  Major depression: Resume home antidepressant medications.  Chronic hypoxic respiratory failure: Patient uses 4 L of supplemental oxygen at  baseline.  Ongoing tobacco use: Patient continues to smoke despite multiple efforts by PCP to quit smoking. Smoking counseling.  DVT prophylaxis: Lovenox Code Status: DNR Family Communication: No family at bed side. Disposition Plan:   Status is: Inpatient Remains inpatient appropriate because: Admitted for acute on chronic hypoxic and hypercapnic respiratory failure secondary to COPD exacerbation and bilateral pneumonia.  requiring BiPAP support, IV Solu-Medrol, IV antibiotics.   Consults called: Pulmonology Admission status: Inpatient   Willeen Niece MD [facility]   If 7PM-7AM, please contact night-coverage [email or web address]   11/12/2022, 5:20 PM

## 2022-11-12 NOTE — ED Provider Notes (Signed)
[facility] AT [facility] Provider Note   CSN: [id number] Arrival date & time: 11/12/22  1239     History  Chief Complaint  Patient presents with   Shortness of Breath    Christine Serrano is a 62 y.o. female with medical history of cervical cancer, chronic idiopathic constipation, COPD, endometriosis.  Patient reports that she still smokes.  The patient is here complaining of shortness of breath.  The patient reports that she was here last week for shortness of breath and was discharged with improved symptoms.  Patient reports that her symptoms have worsened over the last week.  The patient chart was reviewed and the patient was actually not here last week.  She has not been seen in this ED since 12/24/2021.  The patient is alert and oriented x 3, not oriented to the state we are in.  The patient is unable to provide much information, she seems slightly confused.  She states at 1 point she had a fever yesterday but then denies this later on.  She denies any chest pain.  She is endorsing diarrhea, cannot tell me when it started.  She is also worsening of cough that she is not sure when started.  She has a history of COPD and chronically wears 4 L of oxygen.  The patient reports that yesterday she "bottomed out" and felt like she "was not breathing".  She is unable to explain what this means.  The patient arrives tachycardic with a pulse rate of 133.  She denies a history of A-fib, she denies blood thinners.   Shortness of Breath      Home Medications Prior to Admission medications   Medication Sig Start Date End Date Taking? Authorizing Provider  albuterol (PROAIR HFA) 108 (90 Base) MCG/ACT inhaler Inhale 2 puffs into the lungs every 6 (six) hours as needed for wheezing or shortness of breath. 11/07/22   Eustaquio Boyden, MD  albuterol (PROVENTIL) (2.5 MG/3ML) 0.083% nebulizer solution INHALE CONTENTS OF 1 VIAL VIA NEBULIZER EVERY 6 HOURS AS NEEDED FOR WHEEZE  OR SHORTNESS OF BREATH 08/28/22   Salena Saner, MD  benzonatate (TESSALON) 100 MG capsule TAKE 1 CAPSULE BY MOUTH THREE TIMES A DAY AS NEEDED FOR COUGH. NOT COVERED BY INSURANCE. 11/07/22   Eustaquio Boyden, MD  BREZTRI AEROSPHERE 160-9-4.8 MCG/ACT AERO INHALE 2 PUFFS INTO THE LUNGS IN THE MORNING AND AT BEDTIME. 08/12/22   Salena Saner, MD  buPROPion (WELLBUTRIN XL) 150 MG 24 hr tablet Take 1 tablet (150 mg total) by mouth daily. 12/12/21   Eustaquio Boyden, MD  cholecalciferol (VITAMIN D3) 25 MCG (1000 UNIT) tablet Take 1,000 Units by mouth daily.    [provider]  fluticasone (FLONASE) 50 MCG/ACT nasal spray Place 2 sprays into both nostrils daily. 06/06/20   Eustaquio Boyden, MD  folic acid (FOLVITE) 1 MG tablet Take 1 tablet (1 mg total) by mouth daily. 12/12/21   Eustaquio Boyden, MD  gabapentin (NEURONTIN) 100 MG capsule Take 1 capsule (100 mg total) by mouth 2 (two) times daily. 11/07/22   Eustaquio Boyden, MD  hydrOXYzine (ATARAX) 25 MG tablet Take 1 tablet (25 mg total) by mouth 2 (two) times daily as needed for anxiety. 11/07/22   Eustaquio Boyden, MD  Iron, Ferrous Sulfate, 325 (65 Fe) MG TABS Take 325 mg by mouth daily. 06/29/21   Eustaquio Boyden, MD  LINZESS 290 MCG CAPS capsule Take 1 capsule (290 mcg total) by mouth daily as needed (constipation).  TAKE 1 CAPSULE EVERY DAY AS NEEDED FOR CONSTIPATION AS DIRECTED 05/07/21   Eustaquio Boyden, MD  loratadine (CLARITIN) 10 MG tablet TAKE 1 TABLET BY MOUTH EVERY DAY AS NEEDED 09/16/22   Eustaquio Boyden, MD  meclizine (ANTIVERT) 25 MG tablet Take 1 tablet (25 mg total) by mouth 3 (three) times daily as needed for dizziness. 05/07/21   Eustaquio Boyden, MD  methocarbamol (ROBAXIN) 500 MG tablet TAKE 1 TABLET BY MOUTH 3 TIMES DAILY AS NEEDED FOR MUSCLE SPASMS (SEDATION PRECAUTIONS). 05/07/21   Eustaquio Boyden, MD  Multiple Vitamin (MULTIVITAMIN WITH MINERALS) TABS tablet Take 1 tablet by mouth daily. 09/12/19   Glade Lloyd, MD  naproxen (NAPROSYN) 375 MG tablet TAKE 1 TABLET BY MOUTH TWICE  DAILY AS NEEDED FOR PAIN WITH  FOOD 12/05/21   Eustaquio Boyden, MD  omeprazole (PRILOSEC) 40 MG capsule TAKE 1 CAPSULE BY MOUTH DAILY. FOR THREE WEEKS THEN AS NEEDED. TAKE 30 MIN PRIOR TO LARGE MEAL 11/07/22   Eustaquio Boyden, MD  OXYGEN Inhale 4 L/min into the lungs continuous.     [provider]  Potassium 99 MG TABS Take by mouth.    [provider]  predniSONE (DELTASONE) 5 MG tablet TAKE 2 TABLETS BY MOUTH DAILY WITH BREAKFAST. 08/12/22   Salena Saner, MD  sertraline (ZOLOFT) 100 MG tablet Take 1.5 tablets (150 mg total) by mouth daily. 11/07/22   Eustaquio Boyden, MD  thiamine 100 MG tablet Take 1 tablet (100 mg total) by mouth daily. 09/12/19   Glade Lloyd, MD  traZODone (DESYREL) 50 MG tablet TAKE 1 TABLET BY MOUTH EVERYDAY AT BEDTIME 09/17/22   Eustaquio Boyden, MD  vitamin B-12 (CYANOCOBALAMIN) 1000 MCG tablet Take 1 tablet (1,000 mcg total) by mouth daily. 06/06/20   Eustaquio Boyden, MD      Allergies    Terbinafine and related, Clarithromycin, and Imitrex [sumatriptan]    Review of Systems   Review of Systems  Unable to perform ROS: Mental status change (Level 5 caveat)    Physical Exam Updated Vital Signs BP 98/72   Pulse (!) 118   Temp 98 F (36.7 C) (Oral)   Resp 18   Ht 5\' 5"  (1.651 m)   Wt 37.2 kg   LMP  (LMP Unknown)   SpO2 100%   BMI 13.65 kg/m  Physical Exam Vitals and nursing note reviewed.  Constitutional:      General: She is not in acute distress.    Appearance: Normal appearance. She is not ill-appearing, toxic-appearing or diaphoretic.  HENT:     Head: Normocephalic and atraumatic.     Nose: Nose normal.     Mouth/Throat:     Mouth: Mucous membranes are moist.     Pharynx: Oropharynx is clear.  Eyes:     General:        Left eye: Discharge present.    Extraocular Movements: Extraocular movements intact.     Pupils: Pupils are equal, round,  and reactive to light.  Cardiovascular:     Rate and Rhythm: Regular rhythm. Tachycardia present.  Pulmonary:     Effort: Pulmonary effort is normal.     Comments: Breath sounds decreased bilaterally Abdominal:     General: Abdomen is flat.     Tenderness: There is no abdominal tenderness.  Musculoskeletal:     Cervical back: Normal range of motion and neck supple. No tenderness.  Skin:    General: Skin is warm and dry.     Capillary Refill: Capillary refill  takes less than 2 seconds.  Neurological:     General: No focal deficit present.     Mental Status: She is alert.     GCS: GCS eye subscore is 4. GCS verbal subscore is 5. GCS motor subscore is 6.     Cranial Nerves: Cranial nerves 2-12 are intact. No cranial nerve deficit.     Sensory: Sensation is intact. No sensory deficit.     Motor: Motor function is intact. No weakness.     Coordination: Coordination is intact. Heel to [facility] Test normal.     Comments: Able to answer orientation questions besides what state we are in. She does seem slightly confused. Her neurological examination is reassuring.     ED Results / Procedures / Treatments   Labs (all labs ordered are listed, but only abnormal results are displayed) Labs Reviewed  CBC - Abnormal; Notable for the following components:      Result Value   WBC 10.8 (*)    RBC 3.68 (*)    Hemoglobin 10.6 (*)    HCT 34.8 (*)    All other components within normal limits  COMPREHENSIVE METABOLIC PANEL - Abnormal; Notable for the following components:   Potassium 3.0 (*)    Chloride 82 (*)    CO2 >45 (*)    Glucose, Bld 140 (*)    Total Protein 5.5 (*)    Albumin 2.6 (*)    All other components within normal limits  D-DIMER, QUANTITATIVE - Abnormal; Notable for the following components:   D-Dimer, Quant 1.01 (*)    All other components within normal limits  BLOOD GAS, VENOUS - Abnormal; Notable for the following components:   pCO2, Ven 94 (*)    Bicarbonate 61.0 (*)     Acid-Base Excess 30.9 (*)    All other components within normal limits  TROPONIN I (HIGH SENSITIVITY) - Abnormal; Notable for the following components:   Troponin I (High Sensitivity) 19 (*)    All other components within normal limits  TROPONIN I (HIGH SENSITIVITY) - Abnormal; Notable for the following components:   Troponin I (High Sensitivity) 21 (*)    All other components within normal limits  CULTURE, BLOOD (ROUTINE X 2)  CULTURE, BLOOD (ROUTINE X 2)  LACTIC ACID, PLASMA  LACTIC ACID, PLASMA  URINALYSIS, ROUTINE W REFLEX MICROSCOPIC  HIV ANTIBODY (ROUTINE TESTING W REFLEX)  CBC  CREATININE, SERUM  STREP PNEUMONIAE URINARY ANTIGEN  LEGIONELLA PNEUMOPHILA SEROGP 1 UR AG  CBG MONITORING, ED    EKG EKG Interpretation  Date/Time:  Tuesday Nov 12 2022 12:51:37 EDT Ventricular Rate:  132 PR Interval:  109 QRS Duration: 89 QT Interval:  320 QTC Calculation: 475 R Axis:   125 Text Interpretation: Sinus tachycardia Right axis deviation Abnormal R-wave progression, late transition Borderline repolarization abnormality Confirmed by Kristine Royal [id number]) on 11/12/2022 1:00:31 PM  Radiology DG Chest 2 View  Result Date: 11/12/2022 CLINICAL DATA:  Rule out pneumonia EXAM: CHEST - 2 VIEW COMPARISON:  Chest x-ray dated 172023. FINDINGS: Ill-defined airspace opacities within the upper lobes bilaterally. Coarse lung markings are again seen bilaterally indicating underlying chronic interstitial lung disease/fibrosis. No pneumothorax is seen. Probable small LEFT pleural effusion. Heart size and mediastinal contours are stable. No acute-appearing osseous abnormality. IMPRESSION: 1. Bilateral upper lobe pneumonia superimposed on chronic interstitial lung disease/fibrosis. 2. Probable small LEFT pleural effusion. Electronically Signed   By: Bary Richard M.D.   On: 11/12/2022 14:07   CT Angio Chest PE W and/or  Wo Contrast  Result Date: 11/12/2022 CLINICAL DATA:  Shortness of breath. EXAM: CT  ANGIOGRAPHY CHEST WITH CONTRAST TECHNIQUE: Multidetector CT imaging of the chest was performed using the standard protocol during bolus administration of intravenous contrast. Multiplanar CT image reconstructions and MIPs were obtained to evaluate the vascular anatomy. RADIATION DOSE REDUCTION: This exam was performed according to the departmental dose-optimization program which includes automated exposure control, adjustment of the mA and/or kV according to patient size and/or use of iterative reconstruction technique. CONTRAST:  75mL OMNIPAQUE IOHEXOL 350 MG/ML SOLN COMPARISON:  June 07, 2020.  June 30, 2020. FINDINGS: Cardiovascular: Satisfactory opacification of the pulmonary arteries to the segmental level. No evidence of pulmonary embolism. Normal heart size. No pericardial effusion. Mediastinum/Nodes: No enlarged mediastinal, hilar, or axillary lymph nodes. Thyroid gland, trachea, and esophagus demonstrate no significant findings. Lungs/Pleura: No pneumothorax is noted. Small left pleural effusion is noted. Emphysematous disease is noted bilaterally. Probable 2.7 x 2.4 cm mass is noted medially in left upper lobe. Bilateral upper lobe opacities are noted concerning for atelectasis or possibly infiltrates. Upper Abdomen: No acute abnormality. Musculoskeletal: No chest wall abnormality. No acute or significant osseous findings. Review of the MIP images confirms the above findings. IMPRESSION: No definite evidence of pulmonary embolus. Probable 2.7 x 2.4 cm mass is noted medially in left upper lobe concerning for malignancy. PET scan is recommended for further evaluation. Bilateral upper lobe opacities are noted concerning for atelectasis or possibly infiltrates. Small left pleural effusion. Aortic Atherosclerosis (ICD10-I70.0) and Emphysema (ICD10-J43.9). Electronically Signed   By: Lupita Raider M.D.   On: 11/12/2022 14:07   CT Head Wo Contrast  Result Date: 11/12/2022 CLINICAL DATA:  Mental  status change. EXAM: CT HEAD WITHOUT CONTRAST TECHNIQUE: Contiguous axial images were obtained from the base of the skull through the vertex without intravenous contrast. RADIATION DOSE REDUCTION: This exam was performed according to the departmental dose-optimization program which includes automated exposure control, adjustment of the mA and/or kV according to patient size and/or use of iterative reconstruction technique. COMPARISON:  Head CT dated 10/17/2020 FINDINGS: Brain: No hydrocephalus. No mass, hemorrhage, edema or other evidence of acute parenchymal abnormality. No extra-axial hemorrhage. Vascular: Chronic calcified atherosclerotic changes of the large vessels at the skull base. No unexpected hyperdense vessel. Skull: Normal. Negative for fracture or focal lesion. Sinuses/Orbits: Visualized paranasal sinuses are clear. Visualized periorbital and retro-orbital soft tissues are unremarkable. Other: None. IMPRESSION: No acute findings. No intracranial mass, hemorrhage or edema. Electronically Signed   By: Bary Richard M.D.   On: 11/12/2022 14:03   CT ABDOMEN PELVIS W CONTRAST  Result Date: 11/12/2022 CLINICAL DATA:  Left lower quadrant abdominal pain. Shortness of breath. Diarrhea. EXAM: CT ABDOMEN AND PELVIS WITH CONTRAST TECHNIQUE: Multidetector CT imaging of the abdomen and pelvis was performed using the standard protocol following bolus administration of intravenous contrast. RADIATION DOSE REDUCTION: This exam was performed according to the departmental dose-optimization program which includes automated exposure control, adjustment of the mA and/or kV according to patient size and/or use of iterative reconstruction technique. CONTRAST:  75mL OMNIPAQUE IOHEXOL 350 MG/ML SOLN COMPARISON:  None Available. FINDINGS: Lower chest: Scarring and emphysematous change noted at the lung bases. Hepatobiliary: Diffuse mild fatty change of the liver. No focal lesion. No calcified gallstones. Pancreas: Normal  Spleen: Normal Adrenals/Urinary Tract: Adrenal glands are normal. Kidneys are normal. No stone, mass or hydronephrosis. Bladder is normal. Stomach/Bowel: Stomach and small intestine are normal. Appendix is not visualized. Right: And transverse colon appear  normal. Left colon is normal. No evidence of diverticulosis or diverticulitis. Vascular/Lymphatic: Aortic atherosclerosis, advanced. Severe stenotic disease at the distal aorta/proximal common iliac arteries. Reproductive: Calcified leiomyoma.  No other pelvic mass or finding. Other: No free fluid or air. Musculoskeletal: Chronic lumbar degenerative changes. IMPRESSION: 1. No acute finding to explain the clinical presentation. No evidence of diverticulosis or diverticulitis. 2. Diffuse mild fatty change of the liver. 3. Advanced aortic atherosclerosis. Severe stenotic disease at the distal aorta/proximal common iliac arteries. Aortic Atherosclerosis (ICD10-I70.0). Electronically Signed   By: Paulina Fusi M.D.   On: 11/12/2022 14:01    Procedures .Critical Care  Performed by: Al Decant, PA-C Authorized by: Al Decant, PA-C   Critical care provider statement:    Critical care time (minutes):  75   Critical care time was exclusive of:  Separately billable procedures and treating other patients   Critical care was necessary to treat or prevent imminent or life-threatening deterioration of the following conditions:  Respiratory failure   Critical care was time spent personally by me on the following activities:  Ordering and performing treatments and interventions, blood draw for specimens, development of treatment plan with patient or surrogate, ordering and review of laboratory studies, ordering and review of radiographic studies, pulse oximetry, re-evaluation of patient's condition, review of old charts, ventilator management, obtaining history from patient or surrogate, interpretation of cardiac output measurements, examination of  patient, evaluation of patient's response to treatment, discussions with primary provider and discussions with consultants   I assumed direction of critical care for this patient from another provider in my specialty: no     Care discussed with: admitting provider      Medications Ordered in ED Medications  azithromycin (ZITHROMAX) 500 mg in sodium chloride 0.9 % 250 mL IVPB (500 mg Intravenous New Bag/Given 11/12/22 1601)  potassium chloride 10 mEq in 100 mL IVPB (10 mEq Intravenous New Bag/Given 11/12/22 1557)  enoxaparin (LOVENOX) injection 40 mg (has no administration in time range)  0.9 %  sodium chloride infusion (has no administration in time range)  acetaminophen (TYLENOL) tablet 650 mg (has no administration in time range)    Or  acetaminophen (TYLENOL) suppository 650 mg (has no administration in time range)  docusate sodium (COLACE) capsule 100 mg (has no administration in time range)  ondansetron (ZOFRAN) tablet 4 mg (has no administration in time range)    Or  ondansetron (ZOFRAN) injection 4 mg (has no administration in time range)  guaiFENesin (MUCINEX) 12 hr tablet 600 mg (has no administration in time range)  hydrALAZINE (APRESOLINE) injection 10 mg (has no administration in time range)  cefTRIAXone (ROCEPHIN) 1 g in sodium chloride 0.9 % 100 mL IVPB (has no administration in time range)  doxycycline (VIBRAMYCIN) 100 mg in sodium chloride 0.9 % 250 mL IVPB (has no administration in time range)  methylPREDNISolone sodium succinate (SOLU-MEDROL) 125 mg/2 mL injection 60 mg (has no administration in time range)  levalbuterol (XOPENEX) nebulizer solution 0.63 mg (has no administration in time range)  sodium chloride 0.9 % bolus 1,000 mL (0 mLs Intravenous Stopped 11/12/22 1522)  iohexol (OMNIPAQUE) 350 MG/ML injection 75 mL (75 mLs Intravenous Contrast Given 11/12/22 1342)  methylPREDNISolone sodium succinate (SOLU-MEDROL) 125 mg/2 mL injection 125 mg (125 mg Intravenous Given  11/12/22 1414)  cefTRIAXone (ROCEPHIN) 1 g in sodium chloride 0.9 % 100 mL IVPB (0 g Intravenous Stopped 11/12/22 1522)    ED Course/ Medical Decision Making/ A&P Clinical Course as of 11/12/22 1633  Tue Nov 12, 2022  1539 Admit for hypercarbia, COPD exacerbation, questionable lung cancer  [CG]    Clinical Course User Index [CG] Al Decant, PA-C    Medical Decision Making Amount and/or Complexity of Data Reviewed Labs: ordered. Radiology: ordered.   62 year old female presents to the ED for evaluation.  Please see HPI for further details.  On examination the patient is afebrile and nontachycardic.  Her lung sounds are decreased bilaterally, she is chronically on 4 L of oxygen.  Abdomen soft and compressible.  She does easily confused as above stated in my HPI.  CBC with a slight leukocytosis 10.8, hemoglobin of 10.6.  CMP with potassium 3 which we will replete with 10 mEq IV potassium.  Chloride 82.  AST, ALT not elevated.  Lactic acid 1.1.  Troponin initially 19, delta 21.  Denies chest pain.  EKG is nonischemic.  Venous blood gas shows pCO2 of 94.  Patient was placed on BiPAP at this time due to hypercarbia.  Patient D-dimer elevated 1.01 so we will proceed with CT angiogram as well as CT abdomen pelvis due to patient complaining of abdominal pain with nausea vomiting and diarrhea.  CT angio chest unremarkable for evidence of acute PE.  There is notable mass in patient lungs which they are recommending PET scan for.  This could be malignancy.  Patient CT abdomen pelvis unremarkable.  Patient given 125 Solu-Medrol for suspected COPD exacerbation.  Given 1 L fluid for tachycardia.  Also given 1g Rocephin, 500 azithromycin for community-acquired pneumonia.  Patient will need to be admitted for hypercarbia secondary to COPD as well as community-acquired pneumonia.  The patient was discussed with Dr. Idelle Leech who has agreed to admit the patient.  Patient amenable to plan.   Final  Clinical Impression(s) / ED Diagnoses Final diagnoses:  Hypercarbia  Shortness of breath  Community acquired pneumonia, unspecified laterality    Rx / DC Orders ED Discharge Orders     None         Clent Ridges 11/12/22 1633    Wynetta Fines, MD 11/13/22 [phone]

## 2022-11-12 NOTE — Progress Notes (Signed)
Spoke with Scientist, water quality, RN from ED unable to bring pt to ICU at this moment. This nurse will give report to next nurse and patient will be brought to ICU between 1915-1930. This nurse requested a heads up before movement and ED nurse agreed.

## 2022-11-12 NOTE — ED Notes (Signed)
ED TO INPATIENT HANDOFF REPORT  Name/Age/Gender Christine Serrano 62 y.o. female  Code Status    Code Status Orders  (From admission, onward)           Start     Ordered   11/12/22 1608  Do not attempt resuscitation (DNR)  Continuous       Question Answer Comment  If patient has no pulse and is not breathing Do Not Attempt Resuscitation   If patient has a pulse and/or is breathing: Medical Treatment Goals LIMITED ADDITIONAL INTERVENTIONS: Use medication/IV fluids and cardiac monitoring as indicated; Do not use intubation or mechanical ventilation (DNI), also provide comfort medications.  Transfer to Progressive/Stepdown as indicated, avoid Intensive Care.   Consent: Discussion documented in EHR or advanced directives reviewed      11/12/22 1608           Code Status History     Date Active Date Inactive Code Status Order ID Comments User Context   11/12/2022 1607 11/12/2022 1608 Full Code [id number]  Willeen Niece, MD ED   01/08/2020 1647 01/11/2020 1957 DNR [id number]  Jae Dire, MD ED   12/11/2019 0000 12/17/2019 1822 Partial Code [id number]  Eduard Clos, MD ED   09/11/2019 0550 09/12/2019 2042 Partial Code [id number]  John Giovanni, MD ED   09/11/2019 0510 09/11/2019 0550 Full Code [id number]  John Giovanni, MD ED   05/16/2019 0320 05/20/2019 1951 DNR [id number]  Therisa Doyne, MD Inpatient   04/09/2016 2240 04/11/2016 1604 Full Code [id number]  Gwendolyn Fill, NP ED      Advance Directive Documentation    Flowsheet Row Most Recent Value  Type of Advance Directive Healthcare Power of Attorney  Pre-existing out of facility DNR order (yellow form or pink MOST form) --  "MOST" Form in Place? --       Home/SNF/Other Home  Chief Complaint Acute respiratory failure with hypoxia and hypercapnia (HCC) [J96.01, J96.02]  Level of Care/Admitting Diagnosis ED Disposition     ED Disposition  Admit   Condition  --   Comment  Hospital Area:  [facility] [[id number]]  Level of Care: Stepdown [14]  Admit to SDU based on following criteria: Respiratory Distress:  Frequent assessment and/or intervention to maintain adequate ventilation/respiration, pulmonary toilet, and respiratory treatment.  May admit patient to [facility] or [facility] if equivalent level of care is available:: Yes  Covid Evaluation: Asymptomatic - no recent exposure (last 10 days) testing not required  Diagnosis: Acute respiratory failure with hypoxia and hypercapnia [facility]) [[id number]]  Admitting Physician: Chevis Pretty  Attending Physician: Chevis Pretty  Certification:: I certify this patient will need inpatient services for at least 2 midnights  Estimated Length of Stay: 3          Medical History Past Medical History:  Diagnosis Date   Cervical cancer (HCC)    Chronic idiopathic constipation    Community acquired pneumonia of right upper lobe of lung 12/10/2016   COPD (chronic obstructive pulmonary disease) (HCC)    Depression    Endometriosis    HLD (hyperlipidemia)    Smoker     Allergies Allergies  Allergen Reactions   Terbinafine And Related Diarrhea    Diarrheal illness while on terbinafine ?related to med   Clarithromycin     Caused Thrush    Imitrex [Sumatriptan] Other (See Comments)    Worsens migraine     IV Location/Drains/Wounds Patient Lines/Drains/Airways Status     Active Line/Drains/Airways  Name Placement date Placement time Site Days   Peripheral IV 11/12/22 18 G Left Antecubital 11/12/22  1247  Antecubital  less than 1   Peripheral IV 11/12/22 20 G Right Antecubital 11/12/22  1500  Antecubital  less than 1            Labs/Imaging Results for orders placed or performed during the hospital encounter of 11/12/22 (from the past 48 hour(s))  Lactic acid, plasma     Status: None   Collection Time: 11/12/22  1:02 PM  Result Value Ref Range   Lactic Acid, Venous 1.4 0.5 - 1.9  mmol/L    Comment: Performed at [facility], [address], [place], [place] [address]  CBC     Status: Abnormal   Collection Time: 11/12/22  1:02 PM  Result Value Ref Range   WBC 10.8 (H) 4.0 - 10.5 K/uL   RBC 3.68 (L) 3.87 - 5.11 MIL/uL   Hemoglobin 10.6 (L) 12.0 - 15.0 g/dL   HCT 62.1 (L) 30.8 - 65.7 %   MCV 94.6 80.0 - 100.0 fL   MCH 28.8 26.0 - 34.0 pg   MCHC 30.5 30.0 - 36.0 g/dL   RDW 84.6 96.2 - 95.2 %   Platelets 224 150 - 400 K/uL   nRBC 0.0 0.0 - 0.2 %    Comment: Performed at [facility], [address] [place], [place] [address]  Comprehensive metabolic panel     Status: Abnormal   Collection Time: 11/12/22  1:02 PM  Result Value Ref Range   Sodium 143 135 - 145 mmol/L   Potassium 3.0 (L) 3.5 - 5.1 mmol/L   Chloride 82 (L) 98 - 111 mmol/L   CO2 >45 (H) 22 - 32 mmol/L   Glucose, Bld 140 (H) 70 - 99 mg/dL    Comment: Glucose reference range applies only to samples taken after fasting for at least 8 hours.   BUN 18 8 - 23 mg/dL   Creatinine, Ser 4.40 0.44 - 1.00 mg/dL   Calcium 8.9 8.9 - 10.2 mg/dL   Total Protein 5.5 (L) 6.5 - 8.1 g/dL   Albumin 2.6 (L) 3.5 - 5.0 g/dL   AST 41 15 - 41 U/L   ALT 21 0 - 44 U/L   Alkaline Phosphatase 69 38 - 126 U/L   Total Bilirubin 1.2 0.3 - 1.2 mg/dL   GFR, Estimated >72 >53 mL/min    Comment: (NOTE) Calculated using the CKD-EPI Creatinine Equation (2021)    Anion gap NOT CALCULATED 5 - 15    Comment: Performed at [facility], [address], [place], [place] [address]  D-dimer, quantitative     Status: Abnormal   Collection Time: 11/12/22  1:02 PM  Result Value Ref Range   D-Dimer, Quant 1.01 (H) 0.00 - 0.50 ug/mL-FEU    Comment: (NOTE) At the manufacturer cut-off value of 0.5 g/mL FEU, this assay has a negative predictive value of 95-100%.This assay is intended for use in conjunction with a clinical pretest probability (PTP) assessment model to exclude  pulmonary embolism (PE) and deep venous thrombosis (DVT) in outpatients suspected of PE or DVT. Results should be correlated with clinical presentation. Performed at [facility], [address], [place], [place] [address]   Troponin I (High Sensitivity)     Status: Abnormal   Collection Time: 11/12/22  1:02 PM  Result Value Ref Range   Troponin I (High Sensitivity) 19 (H) <18 ng/L  Comment: (NOTE) Elevated high sensitivity troponin I (hsTnI) values and significant  changes across serial measurements may suggest ACS but many other  chronic and acute conditions are known to elevate hsTnI results.  Refer to the "Links" section for chest pain algorithms and additional  guidance. Performed at [facility], [address], [place], [place] [address]   Blood gas, venous (at [facility])     Status: Abnormal   Collection Time: 11/12/22  1:32 PM  Result Value Ref Range   pH, Ven 7.42 7.25 - 7.43   pCO2, Ven 94 (HH) 44 - 60 mmHg    Comment: CRITICAL RESULT CALLED TO, READ BACK BY AND VERIFIED WITH: ORE,B. EMTP AT 1342 11/12/22 MULLINS,T    pO2, Ven 40 32 - 45 mmHg   Bicarbonate 61.0 (H) 20.0 - 28.0 mmol/L   Acid-Base Excess 30.9 (H) 0.0 - 2.0 mmol/L   O2 Saturation 70 %   Patient temperature 37.0     Comment: Performed at [facility], [address] [place], [place] [address]  Lactic acid, plasma     Status: None   Collection Time: 11/12/22  3:02 PM  Result Value Ref Range   Lactic Acid, Venous 1.1 0.5 - 1.9 mmol/L    Comment: Performed at [facility], [address], [place], [place] [address]  Troponin I (High Sensitivity)     Status: Abnormal   Collection Time: 11/12/22  3:02 PM  Result Value Ref Range   Troponin I (High Sensitivity) 21 (H) <18 ng/L    Comment: (NOTE) Elevated high sensitivity troponin I (hsTnI) values and significant  changes across serial measurements may suggest ACS but many other   chronic and acute conditions are known to elevate hsTnI results.  Refer to the "Links" section for chest pain algorithms and additional  guidance. Performed at [facility], [address], [place], [place] [address]   Urinalysis, Routine w reflex microscopic -Urine, Clean Catch     Status: Abnormal   Collection Time: 11/12/22  5:52 PM  Result Value Ref Range   Color, Urine YELLOW YELLOW   APPearance CLEAR CLEAR   Specific Gravity, Urine >1.046 (H) 1.005 - 1.030   pH 6.0 5.0 - 8.0   Glucose, UA NEGATIVE NEGATIVE mg/dL   Hgb urine dipstick NEGATIVE NEGATIVE   Bilirubin Urine NEGATIVE NEGATIVE   Ketones, ur 80 (A) NEGATIVE mg/dL   Protein, ur 30 (A) NEGATIVE mg/dL   Nitrite NEGATIVE NEGATIVE   Leukocytes,Ua TRACE (A) NEGATIVE   RBC / HPF 0-5 0 - 5 RBC/hpf   WBC, UA 6-10 0 - 5 WBC/hpf   Bacteria, UA NONE SEEN NONE SEEN   Squamous Epithelial / HPF 11-20 0 - 5 /HPF   Mucus PRESENT     Comment: Performed at [facility], [address] [place], [place] [address]   DG Chest 2 View  Result Date: 11/12/2022 CLINICAL DATA:  Rule out pneumonia EXAM: CHEST - 2 VIEW COMPARISON:  Chest x-ray dated 172023. FINDINGS: Ill-defined airspace opacities within the upper lobes bilaterally. Coarse lung markings are again seen bilaterally indicating underlying chronic interstitial lung disease/fibrosis. No pneumothorax is seen. Probable small LEFT pleural effusion. Heart size and mediastinal contours are stable. No acute-appearing osseous abnormality. IMPRESSION: 1. Bilateral upper lobe pneumonia superimposed on chronic interstitial lung disease/fibrosis. 2. Probable small LEFT pleural effusion. Electronically Signed   By: Bary Richard M.D.   On: 11/12/2022 14:07   CT  Angio Chest PE W and/or Wo Contrast  Result Date: 11/12/2022 CLINICAL DATA:  Shortness of breath. EXAM: CT ANGIOGRAPHY CHEST WITH CONTRAST TECHNIQUE: Multidetector CT imaging of the chest was performed  using the standard protocol during bolus administration of intravenous contrast. Multiplanar CT image reconstructions and MIPs were obtained to evaluate the vascular anatomy. RADIATION DOSE REDUCTION: This exam was performed according to the departmental dose-optimization program which includes automated exposure control, adjustment of the mA and/or kV according to patient size and/or use of iterative reconstruction technique. CONTRAST:  75mL OMNIPAQUE IOHEXOL 350 MG/ML SOLN COMPARISON:  June 07, 2020.  June 30, 2020. FINDINGS: Cardiovascular: Satisfactory opacification of the pulmonary arteries to the segmental level. No evidence of pulmonary embolism. Normal heart size. No pericardial effusion. Mediastinum/Nodes: No enlarged mediastinal, hilar, or axillary lymph nodes. Thyroid gland, trachea, and esophagus demonstrate no significant findings. Lungs/Pleura: No pneumothorax is noted. Small left pleural effusion is noted. Emphysematous disease is noted bilaterally. Probable 2.7 x 2.4 cm mass is noted medially in left upper lobe. Bilateral upper lobe opacities are noted concerning for atelectasis or possibly infiltrates. Upper Abdomen: No acute abnormality. Musculoskeletal: No chest wall abnormality. No acute or significant osseous findings. Review of the MIP images confirms the above findings. IMPRESSION: No definite evidence of pulmonary embolus. Probable 2.7 x 2.4 cm mass is noted medially in left upper lobe concerning for malignancy. PET scan is recommended for further evaluation. Bilateral upper lobe opacities are noted concerning for atelectasis or possibly infiltrates. Small left pleural effusion. Aortic Atherosclerosis (ICD10-I70.0) and Emphysema (ICD10-J43.9). Electronically Signed   By: Lupita Raider M.D.   On: 11/12/2022 14:07   CT Head Wo Contrast  Result Date: 11/12/2022 CLINICAL DATA:  Mental status change. EXAM: CT HEAD WITHOUT CONTRAST TECHNIQUE: Contiguous axial images were obtained from  the base of the skull through the vertex without intravenous contrast. RADIATION DOSE REDUCTION: This exam was performed according to the departmental dose-optimization program which includes automated exposure control, adjustment of the mA and/or kV according to patient size and/or use of iterative reconstruction technique. COMPARISON:  Head CT dated 10/17/2020 FINDINGS: Brain: No hydrocephalus. No mass, hemorrhage, edema or other evidence of acute parenchymal abnormality. No extra-axial hemorrhage. Vascular: Chronic calcified atherosclerotic changes of the large vessels at the skull base. No unexpected hyperdense vessel. Skull: Normal. Negative for fracture or focal lesion. Sinuses/Orbits: Visualized paranasal sinuses are clear. Visualized periorbital and retro-orbital soft tissues are unremarkable. Other: None. IMPRESSION: No acute findings. No intracranial mass, hemorrhage or edema. Electronically Signed   By: Bary Richard M.D.   On: 11/12/2022 14:03   CT ABDOMEN PELVIS W CONTRAST  Result Date: 11/12/2022 CLINICAL DATA:  Left lower quadrant abdominal pain. Shortness of breath. Diarrhea. EXAM: CT ABDOMEN AND PELVIS WITH CONTRAST TECHNIQUE: Multidetector CT imaging of the abdomen and pelvis was performed using the standard protocol following bolus administration of intravenous contrast. RADIATION DOSE REDUCTION: This exam was performed according to the departmental dose-optimization program which includes automated exposure control, adjustment of the mA and/or kV according to patient size and/or use of iterative reconstruction technique. CONTRAST:  75mL OMNIPAQUE IOHEXOL 350 MG/ML SOLN COMPARISON:  None Available. FINDINGS: Lower chest: Scarring and emphysematous change noted at the lung bases. Hepatobiliary: Diffuse mild fatty change of the liver. No focal lesion. No calcified gallstones. Pancreas: Normal Spleen: Normal Adrenals/Urinary Tract: Adrenal glands are normal. Kidneys are normal. No stone, mass or  hydronephrosis. Bladder is normal. Stomach/Bowel: Stomach and small intestine are normal. Appendix is not visualized.  Right: And transverse colon appear normal. Left colon is normal. No evidence of diverticulosis or diverticulitis. Vascular/Lymphatic: Aortic atherosclerosis, advanced. Severe stenotic disease at the distal aorta/proximal common iliac arteries. Reproductive: Calcified leiomyoma.  No other pelvic mass or finding. Other: No free fluid or air. Musculoskeletal: Chronic lumbar degenerative changes. IMPRESSION: 1. No acute finding to explain the clinical presentation. No evidence of diverticulosis or diverticulitis. 2. Diffuse mild fatty change of the liver. 3. Advanced aortic atherosclerosis. Severe stenotic disease at the distal aorta/proximal common iliac arteries. Aortic Atherosclerosis (ICD10-I70.0). Electronically Signed   By: Paulina Fusi M.D.   On: 11/12/2022 14:01    Pending Labs Unresulted Labs (From admission, onward)     Start     Ordered   11/19/22 0500  Creatinine, serum  (enoxaparin (LOVENOX)    CrCl >/= 30 ml/min)  Weekly,   R     Comments: while on enoxaparin therapy    11/12/22 1607   11/13/22 0500  Comprehensive metabolic panel  Tomorrow morning,   R        11/12/22 1607   11/13/22 0500  CBC  Tomorrow morning,   R        11/12/22 1607   11/13/22 0500  Magnesium  Tomorrow morning,   R        11/12/22 1607   11/13/22 0500  Phosphorus  Tomorrow morning,   R        11/12/22 1607   11/12/22 1712  SARS Coronavirus 2 by RT PCR (hospital order, performed in [facility] hospital lab) *cepheid single result test* Anterior Nasal Swab  (Tier 2 - SARS Coronavirus 2 by RT PCR (hospital order, performed in [facility] hospital lab) *cepheid single result test*)  Once,   R        11/12/22 1712   11/12/22 1650  Respiratory (~20 pathogens) panel by PCR  (Respiratory panel by PCR (~20 pathogens, ~24 hr TAT)  w precautions)  Once,   R        11/12/22 1649   11/12/22 1607  Strep  pneumoniae urinary antigen  Once,   R        11/12/22 1607   11/12/22 1607  Legionella Pneumophila Serogp 1 Ur Ag  Once,   R        11/12/22 1607   11/12/22 1601  HIV Antibody (routine testing w rflx)  (HIV Antibody (Routine testing w reflex) panel)  Once,   R        11/12/22 1607   11/12/22 1601  CBC  (enoxaparin (LOVENOX)    CrCl >/= 30 ml/min)  Once,   R       Comments: Baseline for enoxaparin therapy IF NOT ALREADY DRAWN.  Notify MD if PLT < 100 K.    11/12/22 1607   11/12/22 1601  Creatinine, serum  (enoxaparin (LOVENOX)    CrCl >/= 30 ml/min)  Once,   R       Comments: Baseline for enoxaparin therapy IF NOT ALREADY DRAWN.    11/12/22 1607   11/12/22 1302  Blood culture (routine x 2)  BLOOD CULTURE X 2,   R (with STAT occurrences)      11/12/22 1303            Vitals/Pain Today's Vitals   11/12/22 1417 11/12/22 1530 11/12/22 1630 11/12/22 1700  BP:  98/72 104/83 95/75  Pulse: (!) 133 (!) 118 (!) 123 (!) 135  Resp: 18 18 17 18   Temp:  98 F (36.7 C)  TempSrc:      SpO2: 100% 100% 100% 100%  Weight:      Height:      PainSc:        Isolation Precautions Airborne and Contact precautions  Medications Medications  enoxaparin (LOVENOX) injection 30 mg (has no administration in time range)  0.9 %  sodium chloride infusion (has no administration in time range)  acetaminophen (TYLENOL) tablet 650 mg (has no administration in time range)    Or  acetaminophen (TYLENOL) suppository 650 mg (has no administration in time range)  docusate sodium (COLACE) capsule 100 mg (has no administration in time range)  ondansetron (ZOFRAN) tablet 4 mg (has no administration in time range)    Or  ondansetron (ZOFRAN) injection 4 mg (has no administration in time range)  guaiFENesin (MUCINEX) 12 hr tablet 600 mg (has no administration in time range)  hydrALAZINE (APRESOLINE) injection 10 mg (has no administration in time range)  cefTRIAXone (ROCEPHIN) 1 g in sodium chloride 0.9 % 100  mL IVPB (has no administration in time range)  doxycycline (VIBRAMYCIN) 100 mg in sodium chloride 0.9 % 250 mL IVPB (has no administration in time range)  methylPREDNISolone sodium succinate (SOLU-MEDROL) 125 mg/2 mL injection 125 mg (has no administration in time range)  arformoterol (BROVANA) nebulizer solution 15 mcg (has no administration in time range)  budesonide (PULMICORT) nebulizer solution 0.5 mg (has no administration in time range)  revefenacin (YUPELRI) nebulizer solution 175 mcg (has no administration in time range)  sodium chloride 0.9 % bolus 1,000 mL (0 mLs Intravenous Stopped 11/12/22 1522)  iohexol (OMNIPAQUE) 350 MG/ML injection 75 mL (75 mLs Intravenous Contrast Given 11/12/22 1342)  methylPREDNISolone sodium succinate (SOLU-MEDROL) 125 mg/2 mL injection 125 mg (125 mg Intravenous Given 11/12/22 1414)  cefTRIAXone (ROCEPHIN) 1 g in sodium chloride 0.9 % 100 mL IVPB (0 g Intravenous Stopped 11/12/22 1522)  azithromycin (ZITHROMAX) 500 mg in sodium chloride 0.9 % 250 mL IVPB (0 mg Intravenous Stopped 11/12/22 1701)  potassium chloride 10 mEq in 100 mL IVPB (0 mEq Intravenous Stopped 11/12/22 1701)    Mobility walks with person assist

## 2022-11-13 ENCOUNTER — Telehealth: Payer: Self-pay | Admitting: Internal Medicine

## 2022-11-13 ENCOUNTER — Inpatient Hospital Stay (HOSPITAL_COMMUNITY): Payer: Medicare HMO

## 2022-11-13 ENCOUNTER — Telehealth: Payer: Self-pay | Admitting: Family Medicine

## 2022-11-13 DIAGNOSIS — R0602 Shortness of breath: Secondary | ICD-10-CM | POA: Diagnosis not present

## 2022-11-13 DIAGNOSIS — J189 Pneumonia, unspecified organism: Secondary | ICD-10-CM

## 2022-11-13 DIAGNOSIS — J9602 Acute respiratory failure with hypercapnia: Secondary | ICD-10-CM | POA: Diagnosis not present

## 2022-11-13 DIAGNOSIS — J9621 Acute and chronic respiratory failure with hypoxia: Secondary | ICD-10-CM

## 2022-11-13 DIAGNOSIS — J441 Chronic obstructive pulmonary disease with (acute) exacerbation: Secondary | ICD-10-CM | POA: Diagnosis not present

## 2022-11-13 DIAGNOSIS — J9622 Acute and chronic respiratory failure with hypercapnia: Secondary | ICD-10-CM | POA: Diagnosis not present

## 2022-11-13 DIAGNOSIS — J9601 Acute respiratory failure with hypoxia: Secondary | ICD-10-CM | POA: Diagnosis not present

## 2022-11-13 DIAGNOSIS — J449 Chronic obstructive pulmonary disease, unspecified: Secondary | ICD-10-CM

## 2022-11-13 DIAGNOSIS — Z515 Encounter for palliative care: Secondary | ICD-10-CM

## 2022-11-13 DIAGNOSIS — Z7189 Other specified counseling: Secondary | ICD-10-CM

## 2022-11-13 DIAGNOSIS — R7989 Other specified abnormal findings of blood chemistry: Secondary | ICD-10-CM

## 2022-11-13 DIAGNOSIS — Z66 Do not resuscitate: Secondary | ICD-10-CM

## 2022-11-13 LAB — BLOOD CULTURE ID PANEL (REFLEXED) - BCID2

## 2022-11-13 LAB — CBC
HCT: 33.6 % — ABNORMAL LOW (ref 36.0–46.0)
Hemoglobin: 10.7 g/dL — ABNORMAL LOW (ref 12.0–15.0)
MCH: 29 pg (ref 26.0–34.0)
MCHC: 31.8 g/dL (ref 30.0–36.0)
MCV: 91.1 fL (ref 80.0–100.0)
Platelets: 259 10*3/uL (ref 150–400)
RBC: 3.69 MIL/uL — ABNORMAL LOW (ref 3.87–5.11)
RDW: 14.7 % (ref 11.5–15.5)
WBC: 10.4 10*3/uL (ref 4.0–10.5)
nRBC: 0 % (ref 0.0–0.2)

## 2022-11-13 LAB — ECHOCARDIOGRAM COMPLETE
AR max vel: 2.71 cm2
AV Area VTI: 2.93 cm2
AV Area mean vel: 2.63 cm2
AV Mean grad: 3 mmHg
AV Peak grad: 5.2 mmHg
Ao pk vel: 1.14 m/s
Area-P 1/2: 3.27 cm2
Height: 65 in
S' Lateral: 3.1 cm
Weight: 1329.81 oz

## 2022-11-13 LAB — COMPREHENSIVE METABOLIC PANEL
ALT: 18 U/L (ref 0–44)
AST: 33 U/L (ref 15–41)
Albumin: 2.3 g/dL — ABNORMAL LOW (ref 3.5–5.0)
Alkaline Phosphatase: 56 U/L (ref 38–126)
Anion gap: 13 (ref 5–15)
BUN: 16 mg/dL (ref 8–23)
CO2: 39 mmol/L — ABNORMAL HIGH (ref 22–32)
Calcium: 8.6 mg/dL — ABNORMAL LOW (ref 8.9–10.3)
Chloride: 88 mmol/L — ABNORMAL LOW (ref 98–111)
Creatinine, Ser: 0.45 mg/dL (ref 0.44–1.00)
GFR, Estimated: >60 mL/min (ref >60)
Glucose, Bld: 115 mg/dL — ABNORMAL HIGH (ref 70–99)
Potassium: 3.1 mmol/L — ABNORMAL LOW (ref 3.5–5.1)
Sodium: 140 mmol/L (ref 135–145)
Total Bilirubin: 1.2 mg/dL (ref 0.3–1.2)
Total Protein: 4.6 g/dL — ABNORMAL LOW (ref 6.5–8.1)

## 2022-11-13 LAB — RESPIRATORY PANEL BY PCR

## 2022-11-13 LAB — MAGNESIUM: Magnesium: 1.4 mg/dL — ABNORMAL LOW (ref 1.7–2.4)

## 2022-11-13 LAB — HIV ANTIBODY (ROUTINE TESTING W REFLEX): HIV Screen 4th Generation wRfx: NONREACTIVE

## 2022-11-13 LAB — PHOSPHORUS: Phosphorus: 2.8 mg/dL (ref 2.5–4.6)

## 2022-11-13 LAB — CULTURE, BLOOD (ROUTINE X 2)

## 2022-11-13 MED ORDER — DOXYCYCLINE HYCLATE 100 MG PO TABS
100.0000 mg | ORAL_TABLET | Freq: Two times a day (BID) | ORAL | Status: DC
  Administered 2022-11-13 – 2022-11-14 (×3): 100 mg via ORAL
  Filled 2022-11-13 (×3): qty 1

## 2022-11-13 MED ORDER — LORAZEPAM 0.5 MG PO TABS
0.5000 mg | ORAL_TABLET | Freq: Four times a day (QID) | ORAL | Status: DC | PRN
  Administered 2022-11-14: 0.5 mg via ORAL
  Filled 2022-11-13: qty 1

## 2022-11-13 MED ORDER — CHLORHEXIDINE GLUCONATE CLOTH 2 % EX PADS: 6.0000 | MEDICATED_PAD | Freq: Every day | CUTANEOUS | Status: DC

## 2022-11-13 MED ORDER — CEFDINIR 300 MG PO CAPS
300.0000 mg | ORAL_CAPSULE | Freq: Two times a day (BID) | ORAL | Status: DC
  Administered 2022-11-13 – 2022-11-14 (×4): 300 mg via ORAL
  Filled 2022-11-13 (×4): qty 1

## 2022-11-13 MED ORDER — MORPHINE SULFATE (CONCENTRATE) 10 MG/0.5ML PO SOLN: 5.0000 mg | ORAL | Status: DC | PRN

## 2022-11-13 MED ORDER — POTASSIUM CHLORIDE 20 MEQ PO PACK
40.0000 meq | PACK | Freq: Once | ORAL | Status: AC
  Administered 2022-11-13: 40 meq via ORAL
  Filled 2022-11-13: qty 2

## 2022-11-13 MED ORDER — ADULT MULTIVITAMIN W/MINERALS CH
1.0000 | ORAL_TABLET | Freq: Every day | ORAL | Status: DC
  Administered 2022-11-13 – 2022-11-14 (×2): 1 via ORAL
  Filled 2022-11-13 (×2): qty 1

## 2022-11-13 MED ORDER — MAGNESIUM SULFATE 2 GM/50ML IV SOLN
2.0000 g | Freq: Once | INTRAVENOUS | Status: AC
  Administered 2022-11-13: 2 g via INTRAVENOUS
  Filled 2022-11-13: qty 50

## 2022-11-13 MED ORDER — PREDNISONE 20 MG PO TABS
40.0000 mg | ORAL_TABLET | Freq: Every day | ORAL | Status: DC
  Administered 2022-11-14: 40 mg via ORAL
  Filled 2022-11-13: qty 2

## 2022-11-13 NOTE — Progress Notes (Signed)
Initial Nutrition Assessment  DOCUMENTATION CODES:   Severe malnutrition in context of chronic illness  INTERVENTION:  - Regular diet.  - Boost Plus BID, each supplement provides 360 kcal and 14 grams of protein  - Provided "High-Calorie, High-Protein Nutrition Therapy" diet education with handout. - Encourage intake as tolerated.  - Monitor magnesium, potassium, and phosphorus for at least 3 days.  - Multivitamin with minerals daily  - Monitor weight trends.    NUTRITION DIAGNOSIS:   Severe Malnutrition related to chronic illness (COPD) as evidenced by severe fat depletion, severe muscle depletion.  GOAL:   Patient will meet greater than or equal to 90% of their needs  MONITOR:   PO intake, Supplement acceptance, Labs, Weight trends  REASON FOR ASSESSMENT:   Consult Assessment of nutrition requirement/status  ASSESSMENT:   62 year old woman w/ hx of COPD, FTT, weight loss, pulmonary cachexia who presented with increasing SOB, cough. Admitted for acute on chronic respiratory failure.  Patient attempting to order lunch at time of visit. Asking RD to order her desired lunch and dinner which was completed.   She reports she has been losing weight for around 1 year, unsure of an exact UBW.  Per EMR, patient weighed at 90# in June 2023 and has continued to lose to current weight of 82#. Changes have not been significant for the time frame.   She endorses not eating well over the past few months due to decreased appetite and feeling too weak to prepare meals. Notes she has not been eating full meals and instead having small snacks throughout the day. Thankfully, has been drinking around 2 Boost at home each day.   Current appetite is much improved and she is hungry. Provided patient with "High-Calorie, High-Protein Nutrition Therapy" handout from the Academy of [employer] Dietetics. Discussed sources of protein rich food sources and encouraged patient to consume protein  at each meal and snack. She is agreeable to receiving Boost and a multivitamin during admission.   Medications reviewed and include: Colace  Labs reviewed:  K+ 3.1 Magnesium 1.4   NUTRITION - FOCUSED PHYSICAL EXAM:  Flowsheet Row Most Recent Value  Orbital Region Severe depletion  Upper Arm Region Moderate depletion  Thoracic and Lumbar Region Severe depletion  Buccal Region Severe depletion  Temple Region Moderate depletion  Clavicle Bone Region Severe depletion  Clavicle and Acromion Bone Region Severe depletion  Scapular Bone Region Unable to assess  Dorsal Hand Severe depletion  Patellar Region Severe depletion  Anterior Thigh Region Severe depletion  Posterior Calf Region Severe depletion  Edema (RD Assessment) None  Hair Reviewed  Eyes Reviewed  Mouth Reviewed  Skin Reviewed  Nails Reviewed       Diet Order:   Diet Order             Diet regular Room service appropriate? Yes; Fluid consistency: Thin  Diet effective now                   EDUCATION NEEDS:  Education needs have been addressed  Skin:  Skin Assessment: Reviewed RN Assessment  Last BM:  PTA  Height:  Ht Readings from Last 1 Encounters:  11/12/22 5\' 5"  (1.651 m)   Weight:  Wt Readings from Last 1 Encounters:  11/12/22 37.7 kg    BMI:  Body mass index is 13.83 kg/m.  Estimated Nutritional Needs:  Kcal:  1700-1900 kcals Protein:  65-75 grams Fluid:  >/= 1.7L    Shelle Iron RD, LDN  For contact information, refer to [facility].

## 2022-11-13 NOTE — Telephone Encounter (Signed)
[facility] called checking on status of a fax they sent over requesting a list of the pt's current med list. [facility] states they sent a fax over about two weeks ago & haven't received the list yet. Fax # is [phone]

## 2022-11-13 NOTE — Telephone Encounter (Signed)
Appointment scheduled for 12/10/2022 at 2:30pm. The patient is currently admitted. I will call once she has been discharged.  Holding message so we can follow up once she is discharged.

## 2022-11-13 NOTE — Progress Notes (Signed)
PHARMACY - PHYSICIAN COMMUNICATION CRITICAL VALUE ALERT - BLOOD CULTURE IDENTIFICATION (BCID)  Christine Serrano is an 62 y.o. female who presented to [facility] on 11/12/2022 with Acute on chronic hypoxic and hypercapnic respiratory failure, suspected CAP.   Assessment: Afebrile, WBC wnl (steroids started 5/28) 5/28 Ur Strep pneumo Ag: negative 5/28 Ur Legionella Ag:  5/28 Covid: neg 5/28 MRSA PCR: not detected 5/28 Resp panel: none detected 5/28 BCx: 1/4 bottles GPC clusters 5/29 BCID: staph species  Name of physician (or Provider) Contacted: Dr Hanley Ben, Dr Katrinka Blazing  Current antibiotics: Ceftriaxone, Doxycycline  Changes to prescribed antibiotics recommended:  No change to antibiotics, culture result may be a contaminant.    Results for orders placed or performed during the hospital encounter of 11/12/22  Blood Culture ID Panel (Reflexed) (Collected: 11/12/2022  1:39 PM)  Result Value Ref Range   Enterococcus faecalis NOT DETECTED NOT DETECTED   Enterococcus Faecium NOT DETECTED NOT DETECTED   Listeria monocytogenes NOT DETECTED NOT DETECTED   Staphylococcus species DETECTED (A) NOT DETECTED   Staphylococcus aureus (BCID) NOT DETECTED NOT DETECTED   Staphylococcus epidermidis NOT DETECTED NOT DETECTED   Staphylococcus lugdunensis NOT DETECTED NOT DETECTED   Streptococcus species NOT DETECTED NOT DETECTED   Streptococcus agalactiae NOT DETECTED NOT DETECTED   Streptococcus pneumoniae NOT DETECTED NOT DETECTED   Streptococcus pyogenes NOT DETECTED NOT DETECTED   A.calcoaceticus-baumannii NOT DETECTED NOT DETECTED   Bacteroides fragilis NOT DETECTED NOT DETECTED   Enterobacterales NOT DETECTED NOT DETECTED   Enterobacter cloacae complex NOT DETECTED NOT DETECTED   Escherichia coli NOT DETECTED NOT DETECTED   Klebsiella aerogenes NOT DETECTED NOT DETECTED   Klebsiella oxytoca NOT DETECTED NOT DETECTED   Klebsiella pneumoniae NOT DETECTED NOT DETECTED   Proteus species NOT DETECTED  NOT DETECTED   Salmonella species NOT DETECTED NOT DETECTED   Serratia marcescens NOT DETECTED NOT DETECTED   Haemophilus influenzae NOT DETECTED NOT DETECTED   Neisseria meningitidis NOT DETECTED NOT DETECTED   Pseudomonas aeruginosa NOT DETECTED NOT DETECTED   Stenotrophomonas maltophilia NOT DETECTED NOT DETECTED   Candida albicans NOT DETECTED NOT DETECTED   Candida auris NOT DETECTED NOT DETECTED   Candida glabrata NOT DETECTED NOT DETECTED   Candida krusei NOT DETECTED NOT DETECTED   Candida parapsilosis NOT DETECTED NOT DETECTED   Candida tropicalis NOT DETECTED NOT DETECTED   Cryptococcus neoformans/gattii NOT DETECTED NOT DETECTED    Lynann Beaver PharmD, BCPS [facility] main pharmacy [phone] 11/13/2022 9:20 AM

## 2022-11-13 NOTE — Progress Notes (Signed)
PROGRESS NOTE    Christine Serrano  ZOX:[id number] DOB: 1960-11-08 DOA: 11/12/2022 PCP: Eustaquio Boyden, MD   Brief Narrative:  62 y.o. female with PMH significant for chronic idiopathic constipation, COPD, chronic hypoxic respiratory failure on 4 L of supplemental oxygen at baseline, depression, hyperlipidemia, ongoing tobacco use presented with worsening cough and shortness of breath.  On presentation, she was hypoxic/hypercapnic requiring BiPAP. CT abdomen and pelvis showed no acute finding. CTA chest ruled out pulmonary embolism but showed 2.7x 2.4 cm mass is noted in the left upper lobe concerning for malignancy.  Bilateral upper lobe opacities possibly infiltrates.  Small pleural effusion. CT head no acute intracranial mass hemorrhage or edema.  She was started on IV antibiotics and steroids.  Pulmonary was consulted.  Assessment & Plan:   Acute on chronic hypoxic and hypercapnic respiratory failure -Normally uses 4 L oxygen at home.  Presented with worsening dyspnea and was found to be hypoxic and hypercapnic requiring BiPAP. -Pulmonary following.  Currently on 3 to 4 L oxygen by nasal cannula.  End stage COPD with exacerbation Tobacco use, ongoing -Started on IV Solu-Medrol and has been switched to oral prednisone this morning by pulmonary.  Continue nebs -Patient was counseled regarding tobacco cessation by admitting hospitalist -Pulmonary recommending patient should consider home hospice. -Palliative care consult.  TOC consult  Bilateral community-acquired bacterial pneumonia Sepsis: Present on admission, possibly from pneumonia -Presented with worsening hypoxia, hypotension, confusion, leukocytosis, pneumonia -Initially started on IV antibiotics which have been switched to oral antibiotics by pulmonary today. -Blood cultures positive for staph species in 1 bottle: Possibly contaminant -COVID-19 and respiratory panel PCR are negative  Lung mass suspicious for  malignancy -Pulmonary following.  Doubt that patient would be a candidate for any kind of treatment for lung cancer.  Awaiting palliative care consultation  Acute metabolic encephalopathy -Possibly from all of the above.  Mental status has improved.  CT of the brain was negative for any acute intracranial abnormity.  Monitor mental status.  Fall precaution.  Nausea and vomiting -Improving.  Continue antiemetics as needed  Elevated troponin -Possibly from demand ischemia from respiratory failure.  Troponins did not trend upwards.  2D echo has been ordered  Anemia of chronic disease -From chronic illnesses.  Hemoglobin stable.  Monitor intermittently  Hypokalemia -Replace.  Repeat a.m. labs  Hypomagnesemia -Replace.  Repeat a.m. labs  Possible severe malnutrition -Consult nutrition    DVT prophylaxis: Lovenox Code Status: DNR Family Communication: None at bedside Disposition Plan: Status is: Inpatient Remains inpatient appropriate because: Of severity of illness    Consultants: Pulmonary/palliative care  Procedures: None  Antimicrobials:  Anti-infectives (From admission, onward)    Start     Dose/Rate Route Frequency Ordered Stop   11/13/22 2000  doxycycline (VIBRA-TABS) tablet 100 mg        100 mg Oral Every 12 hours 11/13/22 0920 11/19/22 2159   11/13/22 1400  cefTRIAXone (ROCEPHIN) 1 g in sodium chloride 0.9 % 100 mL IVPB  Status:  Discontinued        1 g 200 mL/hr over 30 Minutes Intravenous Every 24 hours 11/12/22 1607 11/13/22 0920   11/13/22 1015  cefdinir (OMNICEF) capsule 300 mg        300 mg Oral Every 12 hours 11/13/22 0920 11/19/22 0959   11/12/22 1800  doxycycline (VIBRAMYCIN) 100 mg in sodium chloride 0.9 % 250 mL IVPB  Status:  Discontinued        100 mg 125 mL/hr over 120 Minutes Intravenous  2 times daily 11/12/22 1607 11/13/22 0920   11/12/22 1430  cefTRIAXone (ROCEPHIN) 1 g in sodium chloride 0.9 % 100 mL IVPB        1 g 200 mL/hr over 30 Minutes  Intravenous  Once 11/12/22 1424 11/12/22 1522   11/12/22 1430  azithromycin (ZITHROMAX) 500 mg in sodium chloride 0.9 % 250 mL IVPB        500 mg 250 mL/hr over 60 Minutes Intravenous  Once 11/12/22 1424 11/12/22 1701        Subjective: Patient seen and examined at bedside.  Feels slightly better but still extremely short of breath with exertion.  Denies any current chest pain.  No fever or worsening abdominal pain reported. Objective: Vitals:   11/13/22 0600 11/13/22 0700 11/13/22 0732 11/13/22 0800  BP: 136/62 (!) 150/76 (!) 153/102 132/73  Pulse: (!) 111 (!) 114 (!) 114 (!) 116  Resp: 20 (!) 23 19 (!) 22  Temp:   98.3 F (36.8 C)   TempSrc:   Oral   SpO2: 100% 98% 98% 98%  Weight:      Height:        Intake/Output Summary (Last 24 hours) at 11/13/2022 1015 Last data filed at 11/13/2022 0900 Gross per 24 hour  Intake 2652.96 ml  Output 300 ml  Net 2352.96 ml   Filed Weights   11/12/22 1243 11/12/22 2000  Weight: 37.2 kg 37.7 kg    Examination:  General exam: Appears calm and comfortable.  Looks chronically ill and deconditioned.  Currently on 3 to 4 L oxygen via nasal cannula.  Extremity thinly built respiratory system: Bilateral decreased breath sounds at bases with scattered crackles and intermittent tachypnea Cardiovascular system: S1 & S2 heard, mild intermittent tachycardia present gastrointestinal system: Abdomen is nondistended, soft and nontender. Normal bowel sounds heard. Extremities: No cyanosis, clubbing, edema  Central nervous system: Alert and oriented.  Slow to respond.  Poor historian.  No focal neurological deficits. Moving extremities Skin: No rashes, lesions or ulcers Psychiatry: Mostly flat affect.  Not agitated.  Data Reviewed: I have personally reviewed following labs and imaging studies  CBC: Recent Labs  Lab 11/12/22 1302 11/12/22 1720 11/13/22 0300  WBC 10.8* 9.3 10.4  HGB 10.6* 10.4* 10.7*  HCT 34.8* 34.0* 33.6*  MCV 94.6 93.2 91.1   PLT 224 231 259   Basic Metabolic Panel: Recent Labs  Lab 11/12/22 1302 11/12/22 1720 11/13/22 0300  NA 143  --  140  K 3.0*  --  3.1*  CL 82*  --  88*  CO2 >45*  --  39*  GLUCOSE 140*  --  115*  BUN 18  --  16  CREATININE 0.55 0.44 0.45  CALCIUM 8.9  --  8.6*  MG  --   --  1.4*  PHOS  --   --  2.8   GFR: Estimated Creatinine Clearance: 42.8 mL/min (by C-G formula based on SCr of 0.45 mg/dL). Liver Function Tests: Recent Labs  Lab 11/12/22 1302 11/13/22 0300  AST 41 33  ALT 21 18  ALKPHOS 69 56  BILITOT 1.2 1.2  PROT 5.5* 4.6*  ALBUMIN 2.6* 2.3*   No results for input(s): "LIPASE", "AMYLASE" in the last 168 hours. No results for input(s): "AMMONIA" in the last 168 hours. Coagulation Profile: No results for input(s): "INR", "PROTIME" in the last 168 hours. Cardiac Enzymes: No results for input(s): "CKTOTAL", "CKMB", "CKMBINDEX", "TROPONINI" in the last 168 hours. BNP (last 3 results) No results for input(s): "PROBNP"  in the last 8760 hours. HbA1C: No results for input(s): "HGBA1C" in the last 72 hours. CBG: No results for input(s): "GLUCAP" in the last 168 hours. Lipid Profile: No results for input(s): "CHOL", "HDL", "LDLCALC", "TRIG", "CHOLHDL", "LDLDIRECT" in the last 72 hours. Thyroid Function Tests: No results for input(s): "TSH", "T4TOTAL", "FREET4", "T3FREE", "THYROIDAB" in the last 72 hours. Anemia Panel: No results for input(s): "VITAMINB12", "FOLATE", "FERRITIN", "TIBC", "IRON", "RETICCTPCT" in the last 72 hours. Sepsis Labs: Recent Labs  Lab 11/12/22 1302 11/12/22 1502  LATICACIDVEN 1.4 1.1    Recent Results (from the past 240 hour(s))  Blood culture (routine x 2)     Status: None (Preliminary result)   Collection Time: 11/12/22  1:39 PM   Specimen: BLOOD  Result Value Ref Range Status   Specimen Description   Final    BLOOD SITE NOT SPECIFIED Performed at [facility], [address], [place], [place] [address]     Special Requests   Final    BOTTLES DRAWN AEROBIC AND ANAEROBIC Blood Culture adequate volume Performed at [facility], [address] [place], [place] [address]    Culture  Setup Time   Final    GRAM POSITIVE COCCI IN CLUSTERS AEROBIC BOTTLE ONLY CRITICAL RESULT CALLED TO, READ BACK BY AND VERIFIED WITH: PHARMD T.GREEN AT 0904 ON 11/13/2022 BY T.SAAD. Performed at [facility], [address] [place], [place] [address]    Culture GRAM POSITIVE COCCI  Final   Report Status PENDING  Incomplete  Blood Culture ID Panel (Reflexed)     Status: Abnormal   Collection Time: 11/12/22  1:39 PM  Result Value Ref Range Status   Enterococcus faecalis NOT DETECTED NOT DETECTED Final   Enterococcus Faecium NOT DETECTED NOT DETECTED Final   Listeria monocytogenes NOT DETECTED NOT DETECTED Final   Staphylococcus species DETECTED (A) NOT DETECTED Final    Comment: CRITICAL RESULT CALLED TO, READ BACK BY AND VERIFIED WITH: PHARMD T.GREEN AT 0904 ON 11/13/2022 BY T.SAAD.    Staphylococcus aureus (BCID) NOT DETECTED NOT DETECTED Final   Staphylococcus epidermidis NOT DETECTED NOT DETECTED Final   Staphylococcus lugdunensis NOT DETECTED NOT DETECTED Final   Streptococcus species NOT DETECTED NOT DETECTED Final   Streptococcus agalactiae NOT DETECTED NOT DETECTED Final   Streptococcus pneumoniae NOT DETECTED NOT DETECTED Final   Streptococcus pyogenes NOT DETECTED NOT DETECTED Final   A.calcoaceticus-baumannii NOT DETECTED NOT DETECTED Final   Bacteroides fragilis NOT DETECTED NOT DETECTED Final   Enterobacterales NOT DETECTED NOT DETECTED Final   Enterobacter cloacae complex NOT DETECTED NOT DETECTED Final   Escherichia coli NOT DETECTED NOT DETECTED Final   Klebsiella aerogenes NOT DETECTED NOT DETECTED Final   Klebsiella oxytoca NOT DETECTED NOT DETECTED Final   Klebsiella pneumoniae NOT DETECTED NOT DETECTED Final   Proteus species NOT DETECTED NOT DETECTED Final    Salmonella species NOT DETECTED NOT DETECTED Final   Serratia marcescens NOT DETECTED NOT DETECTED Final   Haemophilus influenzae NOT DETECTED NOT DETECTED Final   Neisseria meningitidis NOT DETECTED NOT DETECTED Final   Pseudomonas aeruginosa NOT DETECTED NOT DETECTED Final   Stenotrophomonas maltophilia NOT DETECTED NOT DETECTED Final   Candida albicans NOT DETECTED NOT DETECTED Final   Candida auris NOT DETECTED NOT DETECTED Final   Candida glabrata NOT DETECTED NOT DETECTED Final   Candida krusei NOT DETECTED NOT DETECTED Final   Candida parapsilosis NOT DETECTED NOT DETECTED Final   Candida tropicalis NOT DETECTED NOT DETECTED  Final   Cryptococcus neoformans/gattii NOT DETECTED NOT DETECTED Final    Comment: Performed at [facility], [address], [place], [place] [address]  Blood culture (routine x 2)     Status: None (Preliminary result)   Collection Time: 11/12/22  1:41 PM   Specimen: BLOOD  Result Value Ref Range Status   Specimen Description   Final    BLOOD SITE NOT SPECIFIED Performed at [facility], [address] [place], [place] [address]    Special Requests   Final    BOTTLES DRAWN AEROBIC AND ANAEROBIC Blood Culture adequate volume Performed at [facility], [address], [place], [place] [address]    Culture   Final    NO GROWTH < 24 HOURS Performed at [facility], [address] [place], [place] [address]    Report Status PENDING  Incomplete  Respiratory (~20 pathogens) panel by PCR     Status: None   Collection Time: 11/12/22  4:50 PM   Specimen: Nasopharyngeal Swab; Respiratory  Result Value Ref Range Status   Adenovirus NOT DETECTED NOT DETECTED Final   Coronavirus 229E NOT DETECTED NOT DETECTED Final    Comment: (NOTE) The Coronavirus on the Respiratory Panel, DOES NOT test for the novel  Coronavirus (2019 nCoV)    Coronavirus HKU1 NOT DETECTED NOT DETECTED Final   Coronavirus NL63 NOT DETECTED  NOT DETECTED Final   Coronavirus OC43 NOT DETECTED NOT DETECTED Final   Metapneumovirus NOT DETECTED NOT DETECTED Final   Rhinovirus / Enterovirus NOT DETECTED NOT DETECTED Final   Influenza A NOT DETECTED NOT DETECTED Final   Influenza B NOT DETECTED NOT DETECTED Final   Parainfluenza Virus 1 NOT DETECTED NOT DETECTED Final   Parainfluenza Virus 2 NOT DETECTED NOT DETECTED Final   Parainfluenza Virus 3 NOT DETECTED NOT DETECTED Final   Parainfluenza Virus 4 NOT DETECTED NOT DETECTED Final   Respiratory Syncytial Virus NOT DETECTED NOT DETECTED Final   Bordetella pertussis NOT DETECTED NOT DETECTED Final   Bordetella Parapertussis NOT DETECTED NOT DETECTED Final   Chlamydophila pneumoniae NOT DETECTED NOT DETECTED Final   Mycoplasma pneumoniae NOT DETECTED NOT DETECTED Final    Comment: Performed at [facility], [address] [place], [place] [address]  SARS Coronavirus 2 by RT PCR (hospital order, performed in [facility] hospital lab) *cepheid single result test* Anterior Nasal Swab     Status: None   Collection Time: 11/12/22  4:50 PM   Specimen: Anterior Nasal Swab  Result Value Ref Range Status   SARS Coronavirus 2 by RT PCR NEGATIVE NEGATIVE Final    Comment: (NOTE) SARS-CoV-2 target nucleic acids are NOT DETECTED.  The SARS-CoV-2 RNA is generally detectable in upper and lower respiratory specimens during the acute phase of infection. The lowest concentration of SARS-CoV-2 viral copies this assay can detect is 250 copies / mL. A negative result does not preclude SARS-CoV-2 infection and should not be used as the sole basis for treatment or other patient management decisions.  A negative result may occur with improper specimen collection / handling, submission of specimen other than nasopharyngeal swab, presence of viral mutation(s) within the areas targeted by this assay, and inadequate number of viral copies (<250 copies / mL). A negative result must be combined with  clinical observations, patient history, and epidemiological information.  Fact Sheet for Patients:   RoadLapTop.co.za  Fact Sheet for Healthcare Providers: http://kim-miller.com/  This test is not yet  approved or  cleared by the [place] and has been authorized for detection and/or diagnosis of SARS-CoV-2 by FDA under an Emergency Use Authorization (EUA).  This EUA will remain in effect (meaning this test can be used) for the duration of the COVID-19 declaration under Section 564(b)(1) of the Act, 21 U.S.C. section 360bbb-3(b)(1), unless the authorization is terminated or revoked sooner.  Performed at [facility], [address] [place], [place] [address]   MRSA Next Gen by PCR, Nasal     Status: None   Collection Time: 11/12/22 10:05 PM   Specimen: Nasal Mucosa; Nasal Swab  Result Value Ref Range Status   MRSA by PCR Next Gen NOT DETECTED NOT DETECTED Final    Comment: (NOTE) The GeneXpert MRSA Assay (FDA approved for NASAL specimens only), is one component of a comprehensive MRSA colonization surveillance program. It is not intended to diagnose MRSA infection nor to guide or monitor treatment for MRSA infections. Test performance is not FDA approved in patients less than 53 years old. Performed at [facility], [address] [place], [place] [address]          Radiology Studies: DG Chest 2 View  Result Date: 11/12/2022 CLINICAL DATA:  Rule out pneumonia EXAM: CHEST - 2 VIEW COMPARISON:  Chest x-ray dated 172023. FINDINGS: Ill-defined airspace opacities within the upper lobes bilaterally. Coarse lung markings are again seen bilaterally indicating underlying chronic interstitial lung disease/fibrosis. No pneumothorax is seen. Probable small LEFT pleural effusion. Heart size and mediastinal contours are stable. No acute-appearing osseous abnormality. IMPRESSION: 1. Bilateral upper  lobe pneumonia superimposed on chronic interstitial lung disease/fibrosis. 2. Probable small LEFT pleural effusion. Electronically Signed   By: Bary Richard M.D.   On: 11/12/2022 14:07   CT Angio Chest PE W and/or Wo Contrast  Result Date: 11/12/2022 CLINICAL DATA:  Shortness of breath. EXAM: CT ANGIOGRAPHY CHEST WITH CONTRAST TECHNIQUE: Multidetector CT imaging of the chest was performed using the standard protocol during bolus administration of intravenous contrast. Multiplanar CT image reconstructions and MIPs were obtained to evaluate the vascular anatomy. RADIATION DOSE REDUCTION: This exam was performed according to the departmental dose-optimization program which includes automated exposure control, adjustment of the mA and/or kV according to patient size and/or use of iterative reconstruction technique. CONTRAST:  75mL OMNIPAQUE IOHEXOL 350 MG/ML SOLN COMPARISON:  June 07, 2020.  June 30, 2020. FINDINGS: Cardiovascular: Satisfactory opacification of the pulmonary arteries to the segmental level. No evidence of pulmonary embolism. Normal heart size. No pericardial effusion. Mediastinum/Nodes: No enlarged mediastinal, hilar, or axillary lymph nodes. Thyroid gland, trachea, and esophagus demonstrate no significant findings. Lungs/Pleura: No pneumothorax is noted. Small left pleural effusion is noted. Emphysematous disease is noted bilaterally. Probable 2.7 x 2.4 cm mass is noted medially in left upper lobe. Bilateral upper lobe opacities are noted concerning for atelectasis or possibly infiltrates. Upper Abdomen: No acute abnormality. Musculoskeletal: No chest wall abnormality. No acute or significant osseous findings. Review of the MIP images confirms the above findings. IMPRESSION: No definite evidence of pulmonary embolus. Probable 2.7 x 2.4 cm mass is noted medially in left upper lobe concerning for malignancy. PET scan is recommended for further evaluation. Bilateral upper lobe opacities are  noted concerning for atelectasis or possibly infiltrates. Small left pleural effusion. Aortic Atherosclerosis (ICD10-I70.0) and Emphysema (ICD10-J43.9). Electronically Signed   By: Lupita Raider M.D.   On: 11/12/2022 14:07   CT Head Wo Contrast  Result Date: 11/12/2022 CLINICAL DATA:  Mental status change. EXAM: CT HEAD WITHOUT CONTRAST TECHNIQUE: Contiguous axial images were obtained from the base of the skull through the vertex without intravenous contrast. RADIATION DOSE REDUCTION: This exam was performed according to the departmental dose-optimization program which includes automated exposure control, adjustment of the mA and/or kV according to patient size and/or use of iterative reconstruction technique. COMPARISON:  Head CT dated 10/17/2020 FINDINGS: Brain: No hydrocephalus. No mass, hemorrhage, edema or other evidence of acute parenchymal abnormality. No extra-axial hemorrhage. Vascular: Chronic calcified atherosclerotic changes of the large vessels at the skull base. No unexpected hyperdense vessel. Skull: Normal. Negative for fracture or focal lesion. Sinuses/Orbits: Visualized paranasal sinuses are clear. Visualized periorbital and retro-orbital soft tissues are unremarkable. Other: None. IMPRESSION: No acute findings. No intracranial mass, hemorrhage or edema. Electronically Signed   By: Bary Richard M.D.   On: 11/12/2022 14:03   CT ABDOMEN PELVIS W CONTRAST  Result Date: 11/12/2022 CLINICAL DATA:  Left lower quadrant abdominal pain. Shortness of breath. Diarrhea. EXAM: CT ABDOMEN AND PELVIS WITH CONTRAST TECHNIQUE: Multidetector CT imaging of the abdomen and pelvis was performed using the standard protocol following bolus administration of intravenous contrast. RADIATION DOSE REDUCTION: This exam was performed according to the departmental dose-optimization program which includes automated exposure control, adjustment of the mA and/or kV according to patient size and/or use of iterative  reconstruction technique. CONTRAST:  75mL OMNIPAQUE IOHEXOL 350 MG/ML SOLN COMPARISON:  None Available. FINDINGS: Lower chest: Scarring and emphysematous change noted at the lung bases. Hepatobiliary: Diffuse mild fatty change of the liver. No focal lesion. No calcified gallstones. Pancreas: Normal Spleen: Normal Adrenals/Urinary Tract: Adrenal glands are normal. Kidneys are normal. No stone, mass or hydronephrosis. Bladder is normal. Stomach/Bowel: Stomach and small intestine are normal. Appendix is not visualized. Right: And transverse colon appear normal. Left colon is normal. No evidence of diverticulosis or diverticulitis. Vascular/Lymphatic: Aortic atherosclerosis, advanced. Severe stenotic disease at the distal aorta/proximal common iliac arteries. Reproductive: Calcified leiomyoma.  No other pelvic mass or finding. Other: No free fluid or air. Musculoskeletal: Chronic lumbar degenerative changes. IMPRESSION: 1. No acute finding to explain the clinical presentation. No evidence of diverticulosis or diverticulitis. 2. Diffuse mild fatty change of the liver. 3. Advanced aortic atherosclerosis. Severe stenotic disease at the distal aorta/proximal common iliac arteries. Aortic Atherosclerosis (ICD10-I70.0). Electronically Signed   By: Paulina Fusi M.D.   On: 11/12/2022 14:01        Scheduled Meds:  arformoterol  15 mcg Nebulization BID   budesonide (PULMICORT) nebulizer solution  0.5 mg Nebulization BID   cefdinir  300 mg Oral Q12H   Chlorhexidine Gluconate Cloth  6 each Topical Daily   docusate sodium  100 mg Oral BID   doxycycline  100 mg Oral Q12H   enoxaparin (LOVENOX) injection  30 mg Subcutaneous Q24H   guaiFENesin  600 mg Oral BID   [START ON 11/14/2022] predniSONE  40 mg Oral Q breakfast   revefenacin  175 mcg Nebulization Daily   Continuous Infusions:        Glade Lloyd, MD [facility] 11/13/2022, 10:15 AM

## 2022-11-13 NOTE — Consult Note (Addendum)
Palliative Care Consult Note                                  Date: 11/13/2022   Patient Name: Christine Serrano  DOB: 09-15-1960  MRN: [id number]  Age / Sex: 62 y.o., female  PCP: Christine Boyden, MD Referring Physician: Glade Lloyd, MD  Reason for Consultation: Establishing goals of care  HPI/Patient Profile: 62 y.o. female  with past medical history of severe COPD and chronic respiratory failure on home oxygen, pulmonary cachexia, and failure to thrive who presented to the ED on 11/12/2022 with increasing shortness of breath and cough.  CTA chest showed possible lung cancer versus pneumonia, and associated small effusion.  She was initially on BiPAP and admitted to the ICU. She transferred out of ICU on 5/29.   Palliative Medicine has been consulted for goals of care.   Subjective:   I have reviewed medical records including progress notes, labs and imaging, and met with patient at bedside to discuss diagnosis, prognosis, GOC, EOL wishes, disposition, and options. She reports ongoing dyspnea, but that it has improved since admission.   Patient was followed by [facility] outpatient palliative until January 2024, when she decided to discharge from their service. I re-introduced Palliative Medicine as specialized medical care for people living with serious illness. It focuses on providing relief from the symptoms and stress of a serious illness.   We discussed patient's current illness and what it means in the larger context of her ongoing co-morbidities. Current clinical status was reviewed. Natural disease trajectory of advanced COPD was discussed.  Created space and opportunity for patient to express thoughts and feelings regarding current medical situation. Values and goals of care were attempted to be elicited.  A discussion was had today regarding advanced directives. Concepts specific to code status, artifical feeding and hydration,  continued IV antibiotics and rehospitalization was had.    Life Review: Christine Serrano tells me she is originally from [place]. Prior to being disabled due to COPD, she worked as a Fish farm manager. She loves animals, and used to show horses. She is not married and does not have children.  Christine Serrano shares that she has suffered significant loss in recent years. Her significant other of 19 years Christine Serrano) committed suicide in 2016. Her good friend and roommate Christine Serrano (Christine Serrano's sister) died in 2022-10-16 this year from COPD.  Since Christine Serrano's death, she has lived with another good friend Christine Serrano.   Functional Status: [name] reports her functional status has declined in the last 6-12 months. She used to be able to ambulate around her home, but now she is mostly confined to bed due to shortness of breath with even minimal exertion.   Patient Understanding of Illness: Christine Serrano understanding that her COPD is end-stage.   Additional Discussion: Patient expresses interest in hospice services at home. She wants to enjoy the time she has left and wants to spend it with her beloved animals and her dear friend Christine Serrano. She wants to go home as soon as possible.   I provided education and counseling on the philosophy and benefits of hospice care. Discussed that hospice provides support for patients in the setting of end-stage illness, while allowing the natural course to occur. Discussed that the goal is comfort and improved quality of life rather than prolonging life. Discussed that hospice can provide assistance with personal care, medications for symptom management, and cover DME in the home. Provided  education on the use of opioids and benzodiazepines to manage dyspnea secondary to advanced COPD.    Review of Systems  Respiratory:  Positive for shortness of breath.     Objective:   Primary Diagnoses: Present on Admission:  Acute respiratory failure with hypoxia and hypercapnia (HCC)  MDD (major depressive  disorder), recurrent episode, severe (HCC)  COPD, very severe (HCC)  Tobacco abuse  Generalized anxiety disorder with panic attacks  HLD (hyperlipidemia)  Community acquired pneumonia  COPD exacerbation (HCC)   Physical Exam Vitals reviewed.  Constitutional:      General: She is not in acute distress.    Appearance: She is cachectic. She is ill-appearing.  Pulmonary:     Effort: Pulmonary effort is normal.  Neurological:     Mental Status: She is alert and oriented to person, place, and time.     Vital Signs:  BP (!) 153/67   Pulse (!) 113   Temp 98.3 F (36.8 C) (Oral)   Resp 15   Ht 5\' 5"  (1.651 m)   Wt 37.7 kg   LMP  (LMP Unknown)   SpO2 93%   BMI 13.83 kg/m   Palliative Assessment/Data: PPS 30%     Assessment & Plan:   SUMMARY OF RECOMMENDATIONS   DNR/DNI Goal of care is comfort and improved quality of life Patient wants to go home with hospice as soon as possible Will need Rx for comfort meds at discharge (see below)  Primary Decision Maker: PATIENT  Advanced Directives: HCPOA document on file in [facility] lists Christine Serrano as patient's health care agent, and she is now deceased Patient states shhe recently completed a new HCPOA document designating Christine Serrano as health care agent - I have asked her to provide hospital with a copy   Symptom Management:  Morphine concentrate solution 5 mg every 3 hours as needed for pain or shortness of breath Ativan 0.5 mg PO every 6 hours as needed for anxiety or sleep  Prognosis:  < 6 months  Discharge Planning:  Home with Hospice   Discussed with: Dr. Hanley Ben [facility]   Thank you for allowing [place] to participate in the care of Christine Serrano  MDM - High  Signed by: Christine Foot, NP Palliative Medicine Team  Team Phone # [phone]  For individual providers, please see AMION

## 2022-11-13 NOTE — Progress Notes (Signed)
eLink Physician-Brief Progress Note Patient Name: TYIA MCGRAIN DOB: March 07, 1961 MRN: [id number]   Date of Service  11/13/2022  HPI/Events of Note  Hypokalemia and hypomagnesemia.  eICU Interventions  Replacements ordered.     Intervention Category Minor Interventions: Electrolytes abnormality - evaluation and management  Carilyn Goodpasture 11/13/2022, 4:26 AM

## 2022-11-13 NOTE — Telephone Encounter (Signed)
Pt states she does not use them and has told them that. Plz disregard any further calls/faxes from this company. (See 09/27/22 phn note.) 

## 2022-11-13 NOTE — Progress Notes (Addendum)
NAME:  Christine Serrano, MRN:  [id number], DOB:  1960/12/04, LOS: 1 ADMISSION DATE:  11/12/2022, CONSULTATION DATE:  11/12/22 REFERRING MD:  Idelle Leech, CHIEF COMPLAINT:  SOB   History of Present Illness:  62 year old woman w/ hx of COPD on HOT, FTT, weight loss, pulmonary cachexia, active smoking presenting with increasing SOB, cough.  She's not the best historian but from what I can tell this started a couple days ago.  +wheezing.  Nonproductive cough.  CTA chest showing possible lung ca vs. PNA, associated small effusion.  She was started on BIPAP.  PCCM consulted.  Pertinent  Medical History  COPD on HOT Pulmonary cachexia  Significant Hospital Events: Including procedures, antibiotic start and stop dates in addition to other pertinent events   5/28 admit to hospital AECOPD +/- PNA vs mass  5/29 cont steroids abd abx   Interim History / Subjective:   NAEO Slept well Breathing doesn't feel better  99% on 3L   Objective   Blood pressure 132/73, pulse (!) 116, temperature 98.3 F (36.8 C), temperature source Oral, resp. rate (!) 22, height 5\' 5"  (1.651 m), weight 37.7 kg, SpO2 98 %.    FiO2 (%):  [40 %] 40 %   Intake/Output Summary (Last 24 hours) at 11/13/2022 0820 Last data filed at 11/13/2022 0600 Gross per 24 hour  Intake 2575.12 ml  Output 300 ml  Net 2275.12 ml   Filed Weights   11/12/22 1243 11/12/22 2000  Weight: 37.2 kg 37.7 kg    Examination: General: cachectic chronically ill F appears older than stated age  HENT: temporal muscle wasting  Lungs: Wheeze, symmetrical chest expansion but overall pretty poor air movement  Cardiovascular: rr cap refill < 3 sec  Abdomen: thin soft  Extremities: no acute joint deformity. Symmetrically decr muscle mass BUE BLE  Neuro: AAOx3 Psych: calm, appropriate for age and situation    Resolved Hospital Problem list   N/A  Assessment & Plan:  Acute on chronic hypoxemic and hypercarbic respiratory failure   AoC hypoxic and  hypercarbic respiratory failure AECOPD LUL CAP vs LUL mass Tobacco use disorder  DNR status  P -cont steroids, CAP coverage -brovana budesonide yupelri  -add IS, encourage mobility. Will order for PT  -ultimately will need a repeat CT scan after tx so we can see if this resolves  -if doesn't resolve and c/f mass escalates, I do worry she may not tolerate bronch well, furthermore if she did undergo bronch and is dx cancer,  not sure with her degree of cachexia that she would be a good candidate for many tx options  -GOC pending tx response    Best Practice (right click and "Reselect all SmartList Selections" daily)  Per primary  Labs   CBC: Recent Labs  Lab 11/12/22 1302 11/12/22 1720 11/13/22 0300  WBC 10.8* 9.3 10.4  HGB 10.6* 10.4* 10.7*  HCT 34.8* 34.0* 33.6*  MCV 94.6 93.2 91.1  PLT 224 231 259    Basic Metabolic Panel: Recent Labs  Lab 11/12/22 1302 11/12/22 1720 11/13/22 0300  NA 143  --  140  K 3.0*  --  3.1*  CL 82*  --  88*  CO2 >45*  --  39*  GLUCOSE 140*  --  115*  BUN 18  --  16  CREATININE 0.55 0.44 0.45  CALCIUM 8.9  --  8.6*  MG  --   --  1.4*  PHOS  --   --  2.8  GFR: Estimated Creatinine Clearance: 42.8 mL/min (by C-G formula based on SCr of 0.45 mg/dL). Recent Labs  Lab 11/12/22 1302 11/12/22 1502 11/12/22 1720 11/13/22 0300  WBC 10.8*  --  9.3 10.4  LATICACIDVEN 1.4 1.1  --   --     Liver Function Tests: Recent Labs  Lab 11/12/22 1302 11/13/22 0300  AST 41 33  ALT 21 18  ALKPHOS 69 56  BILITOT 1.2 1.2  PROT 5.5* 4.6*  ALBUMIN 2.6* 2.3*   No results for input(s): "LIPASE", "AMYLASE" in the last 168 hours. No results for input(s): "AMMONIA" in the last 168 hours.  ABG    Component Value Date/Time   PHART 7.371 05/16/2019 0205   PCO2ART 52.1 (H) 05/16/2019 0205   PO2ART 192 (H) 05/16/2019 0205   HCO3 61.0 (H) 11/12/2022 1332   TCO2 25.9 04/04/2009 1430   O2SAT 70 11/12/2022 1332     Coagulation Profile: No results  for input(s): "INR", "PROTIME" in the last 168 hours.  Cardiac Enzymes: No results for input(s): "CKTOTAL", "CKMB", "CKMBINDEX", "TROPONINI" in the last 168 hours.  HbA1C: No results found for: "HGBA1C"  CBG: No results for input(s): "GLUCAP" in the last 168 hours.  CCT: n/a   Tessie Fass MSN, AGACNP-BC [facility] Pulmonary/Critical Care Medicine Amion for pager  11/13/2022, 8:20 AM

## 2022-11-13 NOTE — Progress Notes (Signed)
  Echocardiogram 2D Echocardiogram has been performed.  Christine Serrano Wynn Banker 11/13/2022, 3:18 PM

## 2022-11-13 NOTE — Telephone Encounter (Signed)
Noted appt. F/U. She is end stage and has missed numerous follow up appointments. Will see on F/U.

## 2022-11-14 ENCOUNTER — Encounter: Payer: Self-pay | Admitting: Family Medicine

## 2022-11-14 ENCOUNTER — Ambulatory Visit: Payer: Medicare HMO | Admitting: Family Medicine

## 2022-11-14 DIAGNOSIS — J9621 Acute and chronic respiratory failure with hypoxia: Secondary | ICD-10-CM | POA: Diagnosis not present

## 2022-11-14 DIAGNOSIS — J441 Chronic obstructive pulmonary disease with (acute) exacerbation: Secondary | ICD-10-CM | POA: Diagnosis not present

## 2022-11-14 DIAGNOSIS — J189 Pneumonia, unspecified organism: Secondary | ICD-10-CM | POA: Diagnosis not present

## 2022-11-14 DIAGNOSIS — J449 Chronic obstructive pulmonary disease, unspecified: Secondary | ICD-10-CM | POA: Diagnosis not present

## 2022-11-14 DIAGNOSIS — Z515 Encounter for palliative care: Secondary | ICD-10-CM | POA: Diagnosis not present

## 2022-11-14 DIAGNOSIS — J9622 Acute and chronic respiratory failure with hypercapnia: Secondary | ICD-10-CM | POA: Diagnosis not present

## 2022-11-14 LAB — BASIC METABOLIC PANEL
Anion gap: 7 (ref 5–15)
BUN: 13 mg/dL (ref 8–23)
CO2: 43 mmol/L — ABNORMAL HIGH (ref 22–32)
Calcium: 8.6 mg/dL — ABNORMAL LOW (ref 8.9–10.3)
Chloride: 89 mmol/L — ABNORMAL LOW (ref 98–111)
Creatinine, Ser: 0.34 mg/dL — ABNORMAL LOW (ref 0.44–1.00)
GFR, Estimated: >60 mL/min (ref >60)
Glucose, Bld: 95 mg/dL (ref 70–99)
Potassium: 2.8 mmol/L — ABNORMAL LOW (ref 3.5–5.1)
Sodium: 139 mmol/L (ref 135–145)

## 2022-11-14 LAB — LEGIONELLA PNEUMOPHILA SEROGP 1 UR AG: L. pneumophila Serogp 1 Ur Ag: NEGATIVE

## 2022-11-14 LAB — MAGNESIUM: Magnesium: 1.9 mg/dL (ref 1.7–2.4)

## 2022-11-14 MED ORDER — LORAZEPAM 0.5 MG PO TABS: 0.5000 mg | ORAL_TABLET | Freq: Four times a day (QID) | ORAL | 0 refills | Status: DC | PRN

## 2022-11-14 MED ORDER — GUAIFENESIN ER 600 MG PO TB12: 600.0000 mg | ORAL_TABLET | Freq: Two times a day (BID) | ORAL | 0 refills | Status: DC

## 2022-11-14 MED ORDER — CEFDINIR 300 MG PO CAPS: 300.0000 mg | ORAL_CAPSULE | Freq: Two times a day (BID) | ORAL | 0 refills | Status: AC

## 2022-11-14 MED ORDER — PREDNISONE 20 MG PO TABS: 40.0000 mg | ORAL_TABLET | Freq: Every day | ORAL | 0 refills | Status: AC

## 2022-11-14 MED ORDER — ALBUTEROL SULFATE (2.5 MG/3ML) 0.083% IN NEBU
2.5000 mg | INHALATION_SOLUTION | RESPIRATORY_TRACT | Status: DC | PRN
  Administered 2022-11-14 (×2): 2.5 mg via RESPIRATORY_TRACT
  Filled 2022-11-14 (×2): qty 3

## 2022-11-14 MED ORDER — MORPHINE SULFATE (CONCENTRATE) 10 MG/0.5ML PO SOLN: 5.0000 mg | ORAL | 0 refills | Status: DC | PRN

## 2022-11-14 MED ORDER — POTASSIUM CHLORIDE CRYS ER 20 MEQ PO TBCR
40.0000 meq | EXTENDED_RELEASE_TABLET | ORAL | Status: AC
  Administered 2022-11-14 (×2): 40 meq via ORAL
  Filled 2022-11-14 (×2): qty 2

## 2022-11-14 MED ORDER — DOXYCYCLINE HYCLATE 100 MG PO TABS: 100.0000 mg | ORAL_TABLET | Freq: Two times a day (BID) | ORAL | 0 refills | Status: AC

## 2022-11-14 NOTE — Progress Notes (Signed)
[employer], [employer] [facility]) Hospital Liaison Note  Referral received for patient/family interest in hospice at home. [facility] liaison spoke with patient's roommate/HCPOA Marchelle Folks to confirm interest. Interest confirmed.   Plan is to discharge home tomorrow.   No current DME needs as patient already has oxygen in the home.   Please send comfort medications/prescriptions home with patient at discharge.   Please call with any questions or concerns. Thank you  Dionicio Stall, [facility] Liaison [phone]

## 2022-11-14 NOTE — Discharge Summary (Signed)
Physician Discharge Summary  Christine Serrano JXB:[id number] DOB: Nov 29, 1960 DOA: 11/12/2022  PCP: Eustaquio Boyden, MD  Admit date: 11/12/2022 Discharge date: 11/14/2022  Admitted From: Home Disposition: Home with hospice  Recommendations for Outpatient Follow-up:  Follow up with home hospice at earliest convenience  Home Health: No Equipment/Devices: None  Discharge Condition: Poor  CODE STATUS: DNR  diet recommendation: Regular  Brief/Interim Summary: 62 y.o. female with PMH significant for chronic idiopathic constipation, COPD, chronic hypoxic respiratory failure on 4 L of supplemental oxygen at baseline, depression, hyperlipidemia, ongoing tobacco use presented with worsening cough and shortness of breath.  On presentation, she was hypoxic/hypercapnic requiring BiPAP. CT abdomen and pelvis showed no acute finding. CTA chest ruled out pulmonary embolism but showed 2.7x 2.4 cm mass is noted in the left upper lobe concerning for malignancy.  Bilateral upper lobe opacities possibly infiltrates.  Small pleural effusion. CT head no acute intracranial mass hemorrhage or edema.  She was started on IV antibiotics and steroids.  Pulmonary was consulted and recommended consideration of home hospice.  Antibiotics and steroids have been switched to oral by pulmonary. Palliative care has evaluated the patient and patient has agreed for home hospice.  Patient wants to be discharged home at earliest convenience.  Discharge to home with hospice once arrangements have been made.    Discharge Diagnoses:   Acute on chronic hypoxic and hypercapnic respiratory failure -Normally uses 4 L oxygen at home.  Presented with worsening dyspnea and was found to be hypoxic and hypercapnic requiring BiPAP. -Respiratory status has improved and currently on 4 L oxygen by nasal cannula.    End stage COPD with exacerbation Tobacco use, ongoing -Started on IV Solu-Medrol and has been switched to oral prednisone by  pulmonary on 11/13/2022. -Patient was counseled regarding tobacco cessation by admitting hospitalist -Pulmonary recommending patient should consider home hospice. -Palliative care has evaluated the patient and patient has agreed for home hospice.  Discharge to home with hospice once arrangements have been made.    Bilateral community-acquired bacterial pneumonia Sepsis: Present on admission, possibly from pneumonia -Presented with worsening hypoxia, hypotension, confusion, leukocytosis, pneumonia -Initially started on IV antibiotics which have been switched to oral antibiotics by pulmonary on 11/13/2022. -Blood cultures positive for staph species in 1 bottle: Possibly contaminant -COVID-19 and respiratory panel PCR are negative -Sepsis has resolved -Discharge patient home today on few more days of oral cefdinir and doxycycline.   Lung mass suspicious for malignancy -Pulmonary evaluation and follow-up appreciated: Doubt that patient would be a candidate for any kind of treatment for lung cancer.  -Will not pursue any further investigation as patient has decided to pursue home hospice.   Acute metabolic encephalopathy -Possibly from all of the above.  Mental status has improved.  CT of the brain was negative for any acute intracranial abnormity.     Nausea and vomiting -Improved.  Tolerating diet.  Elevated troponin Possible new systolic heart failure -Possibly from demand ischemia from respiratory failure.  Troponins did not trend upwards.  2D echo showed EF of 40 to 45%.  Currently euvolemic.  Will not involve cardiology since patient has decided to pursue home hospice.  Anemia of chronic disease -From chronic illnesses.  Hemoglobin stable.     Hypokalemia -Replace prior to discharge  Hypomagnesemia -Improved  Severe malnutrition -Follow nutrition recommendations     Discharge Instructions   Allergies as of 11/14/2022       Reactions   Terbinafine And Related Diarrhea    Diarrheal illness  while on terbinafine ?related to med   Clarithromycin    Caused Thrush    Imitrex [sumatriptan] Other (See Comments)   Worsens migraine         Medication List     TAKE these medications    albuterol (2.5 MG/3ML) 0.083% nebulizer solution Commonly known as: PROVENTIL INHALE CONTENTS OF 1 VIAL VIA NEBULIZER EVERY 6 HOURS AS NEEDED FOR WHEEZE OR SHORTNESS OF BREATH What changed: See the new instructions.   albuterol 108 (90 Base) MCG/ACT inhaler Commonly known as: ProAir HFA Inhale 2 puffs into the lungs every 6 (six) hours as needed for wheezing or shortness of breath. What changed: Another medication with the same name was changed. Make sure you understand how and when to take each.   benzonatate 100 MG capsule Commonly known as: TESSALON TAKE 1 CAPSULE BY MOUTH THREE TIMES A DAY AS NEEDED FOR COUGH. NOT COVERED BY INSURANCE. What changed:  how much to take how to take this when to take this reasons to take this additional instructions   Breztri Aerosphere 160-9-4.8 MCG/ACT Aero Generic drug: Budeson-Glycopyrrol-Formoterol INHALE 2 PUFFS INTO THE LUNGS IN THE MORNING AND AT BEDTIME.   buPROPion 150 MG 24 hr tablet Commonly known as: Wellbutrin XL Take 1 tablet (150 mg total) by mouth daily.   cefdinir 300 MG capsule Commonly known as: OMNICEF Take 1 capsule (300 mg total) by mouth every 12 (twelve) hours for 3 days.   cholecalciferol 25 MCG (1000 UNIT) tablet Commonly known as: VITAMIN D3 Take 1,000 Units by mouth daily.   cyanocobalamin 1000 MCG tablet Commonly known as: VITAMIN B12 Take 1 tablet (1,000 mcg total) by mouth daily.   doxycycline 100 MG tablet Commonly known as: VIBRA-TABS Take 1 tablet (100 mg total) by mouth every 12 (twelve) hours for 3 days.   fluticasone 50 MCG/ACT nasal spray Commonly known as: FLONASE Place 2 sprays into both nostrils daily.   folic acid 1 MG tablet Commonly known as: FOLVITE Take 1 tablet (1 mg  total) by mouth daily.   gabapentin 100 MG capsule Commonly known as: NEURONTIN Take 1 capsule (100 mg total) by mouth 2 (two) times daily.   guaiFENesin 600 MG 12 hr tablet Commonly known as: MUCINEX Take 1 tablet (600 mg total) by mouth 2 (two) times daily.   hydrOXYzine 25 MG tablet Commonly known as: ATARAX Take 1 tablet (25 mg total) by mouth 2 (two) times daily as needed for anxiety.   Iron (Ferrous Sulfate) 325 (65 Fe) MG Tabs Take 325 mg by mouth daily.   Linzess 290 MCG Caps capsule Generic drug: linaclotide Take 1 capsule (290 mcg total) by mouth daily as needed (constipation). TAKE 1 CAPSULE EVERY DAY AS NEEDED FOR CONSTIPATION AS DIRECTED What changed: additional instructions   loratadine 10 MG tablet Commonly known as: CLARITIN TAKE 1 TABLET BY MOUTH EVERY DAY AS NEEDED What changed: reasons to take this   LORazepam 0.5 MG tablet Commonly known as: ATIVAN Take 1 tablet (0.5 mg total) by mouth every 6 (six) hours as needed for anxiety or sleep.   meclizine 25 MG tablet Commonly known as: ANTIVERT Take 1 tablet (25 mg total) by mouth 3 (three) times daily as needed for dizziness.   methocarbamol 500 MG tablet Commonly known as: ROBAXIN TAKE 1 TABLET BY MOUTH 3 TIMES DAILY AS NEEDED FOR MUSCLE SPASMS (SEDATION PRECAUTIONS). What changed:  how much to take how to take this when to take this reasons to take this additional  instructions   morphine CONCENTRATE 10 MG/0.5ML Soln concentrated solution Place 0.25 mLs (5 mg total) under the tongue every 3 (three) hours as needed for severe pain, moderate pain or shortness of breath.   multivitamin with minerals Tabs tablet Take 1 tablet by mouth daily.   naproxen 375 MG tablet Commonly known as: NAPROSYN TAKE 1 TABLET BY MOUTH TWICE  DAILY AS NEEDED FOR PAIN WITH  FOOD What changed:  how much to take how to take this when to take this additional instructions   omeprazole 40 MG capsule Commonly known as:  PRILOSEC TAKE 1 CAPSULE BY MOUTH DAILY. FOR THREE WEEKS THEN AS NEEDED. TAKE 30 MIN PRIOR TO LARGE MEAL   OXYGEN Inhale 4 L/min into the lungs continuous.   Potassium 99 MG Tabs Take 99 mg by mouth daily.   predniSONE 20 MG tablet Commonly known as: DELTASONE Take 2 tablets (40 mg total) by mouth daily with breakfast for 7 days. Start taking on: Nov 15, 2022 What changed:  medication strength how much to take   Prolia 60 MG/ML Sosy injection Generic drug: denosumab Inject 60 mg into the skin every 6 (six) months.   sertraline 100 MG tablet Commonly known as: ZOLOFT Take 1.5 tablets (150 mg total) by mouth daily.   thiamine 100 MG tablet Commonly known as: VITAMIN B1 Take 1 tablet (100 mg total) by mouth daily.   traZODone 50 MG tablet Commonly known as: DESYREL TAKE 1 TABLET BY MOUTH EVERYDAY AT BEDTIME What changed: See the new instructions.        Follow-up Information     Eustaquio Boyden, MD. Schedule an appointment as soon as possible for a visit in 1 week(s).   Specialty: Family Medicine Contact information: [address] [place] [address] [phone]         Home hospice Follow up.   Why: At earliest convenience               Allergies  Allergen Reactions   Terbinafine And Related Diarrhea    Diarrheal illness while on terbinafine ?related to med   Clarithromycin     Caused Thrush    Imitrex [Sumatriptan] Other (See Comments)    Worsens migraine     Consultations: Pulmonary/palliative care   Procedures/Studies: ECHOCARDIOGRAM COMPLETE  Result Date: 11/13/2022    ECHOCARDIOGRAM REPORT   Patient Name:   Christine Serrano Date of Exam: 11/13/2022 Medical Rec #:  [id number]        Height:       65.0 in Accession #:    [id number]       Weight:       83.1 lb Date of Birth:  01/31/1961        BSA:          1.362 m Patient Age:    61 years         BP:           136/62 mmHg Patient Gender: F                HR:           117 bpm.  Exam Location:  Inpatient Procedure: 2D Echo, Cardiac Doppler and Color Doppler Indications:    Elevated troponin  History:        Patient has no prior history of Echocardiogram examinations.                 COPD, Arrythmias:Tachycardia; Risk Factors:Current Smoker and  HLD.  Sonographer:    Lucy Antigua Referring Phys: [phone] PARDEEP KHATRI  Sonographer Comments: Image acquisition challenging due to COPD and Image acquisition challenging due to respiratory motion. IMPRESSIONS  1. Left ventricular ejection fraction, by estimation, is 40 to 45%. The left ventricle has mildly decreased function. Left ventricular endocardial border not optimally defined to evaluate regional wall motion. Left ventricular diastolic parameters are indeterminate.  2. Right ventricular systolic function is normal. The right ventricular size is normal. Mildly increased right ventricular wall thickness.  3. The mitral valve is grossly normal. No evidence of mitral valve regurgitation.  4. Tricuspid valve regurgitation is mild to moderate.  5. The aortic valve was not well visualized. Aortic valve regurgitation is not visualized. No aortic stenosis is present.  6. The inferior vena cava is normal in size with <50% respiratory variability, suggesting right atrial pressure of 8 mmHg.  7. Technically difficult study. Comparison(s): No prior Echocardiogram. FINDINGS  Left Ventricle: Left ventricular ejection fraction, by estimation, is 40 to 45%. The left ventricle has mildly decreased function. Left ventricular endocardial border not optimally defined to evaluate regional wall motion. The left ventricular internal cavity size was normal in size. There is no left ventricular hypertrophy. Left ventricular diastolic parameters are indeterminate. Right Ventricle: The right ventricular size is normal. Mildly increased right ventricular wall thickness. Right ventricular systolic function is normal. Left Atrium: Left atrial size was not  well visualized. Right Atrium: Right atrial size was not well visualized. Pericardium: There is no evidence of pericardial effusion. Mitral Valve: The mitral valve is grossly normal. No evidence of mitral valve regurgitation. Tricuspid Valve: The tricuspid valve is normal in structure. Tricuspid valve regurgitation is mild to moderate. No evidence of tricuspid stenosis. Aortic Valve: The aortic valve was not well visualized. Aortic valve regurgitation is not visualized. No aortic stenosis is present. Aortic valve mean gradient measures 3.0 mmHg. Aortic valve peak gradient measures 5.2 mmHg. Aortic valve area, by VTI measures 2.93 cm. Pulmonic Valve: The pulmonic valve was not well visualized. Pulmonic valve regurgitation is not visualized. No evidence of pulmonic stenosis. Aorta: The aortic root and ascending aorta are structurally normal, with no evidence of dilitation. Venous: The inferior vena cava is normal in size with less than 50% respiratory variability, suggesting right atrial pressure of 8 mmHg. IAS/Shunts: The interatrial septum was not well visualized.  LEFT VENTRICLE PLAX 2D LVIDd:         3.80 cm   Diastology LVIDs:         3.10 cm   LV e' medial:    7.29 cm/s LV PW:         1.00 cm   LV E/e' medial:  6.5 LV IVS:        1.00 cm   LV e' lateral:   8.05 cm/s LVOT diam:     2.30 cm   LV E/e' lateral: 5.9 LV SV:         57 LV SV Index:   42 LVOT Area:     4.15 cm  RIGHT VENTRICLE RV S prime:     10.00 cm/s TAPSE (M-mode): 1.0 cm LEFT ATRIUM           Index        RIGHT ATRIUM          Index LA Vol (A4C): 13.7 ml 10.06 ml/m  RA Area:     5.79 cm  RA Volume:   8.15 ml  5.98 ml/m  AORTIC VALVE AV Area (Vmax):    2.71 cm AV Area (Vmean):   2.63 cm AV Area (VTI):     2.93 cm AV Vmax:           114.00 cm/s AV Vmean:          74.500 cm/s AV VTI:            0.196 m AV Peak Grad:      5.2 mmHg AV Mean Grad:      3.0 mmHg LVOT Vmax:         74.40 cm/s LVOT Vmean:         47.100 cm/s LVOT VTI:          0.138 m LVOT/AV VTI ratio: 0.70  AORTA Ao Root diam: 3.30 cm Ao Asc diam:  3.20 cm MITRAL VALVE               TRICUSPID VALVE MV Area (PHT): 3.27 cm    TR Peak grad:   46.0 mmHg MV Decel Time: 232 msec    TR Vmax:        339.00 cm/s MV E velocity: 47.60 cm/s MV A velocity: 79.70 cm/s  SHUNTS MV E/A ratio:  0.60        Systemic VTI:  0.14 m                            Systemic Diam: 2.30 cm Riley Lam MD Electronically signed by Riley Lam MD Signature Date/Time: 11/13/2022/4:08:58 PM    Final    DG Chest 2 View  Result Date: 11/12/2022 CLINICAL DATA:  Rule out pneumonia EXAM: CHEST - 2 VIEW COMPARISON:  Chest x-ray dated 172023. FINDINGS: Ill-defined airspace opacities within the upper lobes bilaterally. Coarse lung markings are again seen bilaterally indicating underlying chronic interstitial lung disease/fibrosis. No pneumothorax is seen. Probable small LEFT pleural effusion. Heart size and mediastinal contours are stable. No acute-appearing osseous abnormality. IMPRESSION: 1. Bilateral upper lobe pneumonia superimposed on chronic interstitial lung disease/fibrosis. 2. Probable small LEFT pleural effusion. Electronically Signed   By: Bary Richard M.D.   On: 11/12/2022 14:07   CT Angio Chest PE W and/or Wo Contrast  Result Date: 11/12/2022 CLINICAL DATA:  Shortness of breath. EXAM: CT ANGIOGRAPHY CHEST WITH CONTRAST TECHNIQUE: Multidetector CT imaging of the chest was performed using the standard protocol during bolus administration of intravenous contrast. Multiplanar CT image reconstructions and MIPs were obtained to evaluate the vascular anatomy. RADIATION DOSE REDUCTION: This exam was performed according to the departmental dose-optimization program which includes automated exposure control, adjustment of the mA and/or kV according to patient size and/or use of iterative reconstruction technique. CONTRAST:  75mL OMNIPAQUE IOHEXOL 350 MG/ML SOLN  COMPARISON:  June 07, 2020.  June 30, 2020. FINDINGS: Cardiovascular: Satisfactory opacification of the pulmonary arteries to the segmental level. No evidence of pulmonary embolism. Normal heart size. No pericardial effusion. Mediastinum/Nodes: No enlarged mediastinal, hilar, or axillary lymph nodes. Thyroid gland, trachea, and esophagus demonstrate no significant findings. Lungs/Pleura: No pneumothorax is noted. Small left pleural effusion is noted. Emphysematous disease is noted bilaterally. Probable 2.7 x 2.4 cm mass is noted medially in left upper lobe. Bilateral upper lobe opacities are noted concerning for atelectasis or possibly infiltrates. Upper Abdomen: No acute abnormality. Musculoskeletal: No chest wall abnormality. No acute or significant osseous findings. Review of the MIP images confirms the above findings.  IMPRESSION: No definite evidence of pulmonary embolus. Probable 2.7 x 2.4 cm mass is noted medially in left upper lobe concerning for malignancy. PET scan is recommended for further evaluation. Bilateral upper lobe opacities are noted concerning for atelectasis or possibly infiltrates. Small left pleural effusion. Aortic Atherosclerosis (ICD10-I70.0) and Emphysema (ICD10-J43.9). Electronically Signed   By: Lupita Raider M.D.   On: 11/12/2022 14:07   CT Head Wo Contrast  Result Date: 11/12/2022 CLINICAL DATA:  Mental status change. EXAM: CT HEAD WITHOUT CONTRAST TECHNIQUE: Contiguous axial images were obtained from the base of the skull through the vertex without intravenous contrast. RADIATION DOSE REDUCTION: This exam was performed according to the departmental dose-optimization program which includes automated exposure control, adjustment of the mA and/or kV according to patient size and/or use of iterative reconstruction technique. COMPARISON:  Head CT dated 10/17/2020 FINDINGS: Brain: No hydrocephalus. No mass, hemorrhage, edema or other evidence of acute parenchymal abnormality.  No extra-axial hemorrhage. Vascular: Chronic calcified atherosclerotic changes of the large vessels at the skull base. No unexpected hyperdense vessel. Skull: Normal. Negative for fracture or focal lesion. Sinuses/Orbits: Visualized paranasal sinuses are clear. Visualized periorbital and retro-orbital soft tissues are unremarkable. Other: None. IMPRESSION: No acute findings. No intracranial mass, hemorrhage or edema. Electronically Signed   By: Bary Richard M.D.   On: 11/12/2022 14:03   CT ABDOMEN PELVIS W CONTRAST  Result Date: 11/12/2022 CLINICAL DATA:  Left lower quadrant abdominal pain. Shortness of breath. Diarrhea. EXAM: CT ABDOMEN AND PELVIS WITH CONTRAST TECHNIQUE: Multidetector CT imaging of the abdomen and pelvis was performed using the standard protocol following bolus administration of intravenous contrast. RADIATION DOSE REDUCTION: This exam was performed according to the departmental dose-optimization program which includes automated exposure control, adjustment of the mA and/or kV according to patient size and/or use of iterative reconstruction technique. CONTRAST:  75mL OMNIPAQUE IOHEXOL 350 MG/ML SOLN COMPARISON:  None Available. FINDINGS: Lower chest: Scarring and emphysematous change noted at the lung bases. Hepatobiliary: Diffuse mild fatty change of the liver. No focal lesion. No calcified gallstones. Pancreas: Normal Spleen: Normal Adrenals/Urinary Tract: Adrenal glands are normal. Kidneys are normal. No stone, mass or hydronephrosis. Bladder is normal. Stomach/Bowel: Stomach and small intestine are normal. Appendix is not visualized. Right: And transverse colon appear normal. Left colon is normal. No evidence of diverticulosis or diverticulitis. Vascular/Lymphatic: Aortic atherosclerosis, advanced. Severe stenotic disease at the distal aorta/proximal common iliac arteries. Reproductive: Calcified leiomyoma.  No other pelvic mass or finding. Other: No free fluid or air. Musculoskeletal:  Chronic lumbar degenerative changes. IMPRESSION: 1. No acute finding to explain the clinical presentation. No evidence of diverticulosis or diverticulitis. 2. Diffuse mild fatty change of the liver. 3. Advanced aortic atherosclerosis. Severe stenotic disease at the distal aorta/proximal common iliac arteries. Aortic Atherosclerosis (ICD10-I70.0). Electronically Signed   By: Paulina Fusi M.D.   On: 11/12/2022 14:01      Subjective: Patient seen and examined at bedside.  Still short of breath with exertion but wants to go home with hospice today.  No fever or vomiting reported.  Discharge Exam: Vitals:   11/14/22 0455 11/14/22 0606  BP: 111/70   Pulse: 93   Resp: 15   Temp: 98 F (36.7 C)   SpO2: 96% 98%    General: Pt is alert, awake, not in acute distress.  Looks chronically ill and deconditioned.  On 4 L oxygen via nasal cannula.  Extremity thinly built.  Cardiovascular: rate controlled, S1/S2 + Respiratory: bilateral decreased breath sounds at  bases with scattered crackles and wheezing Abdominal: Soft, NT, ND, bowel sounds + Extremities: no edema, no cyanosis    The results of significant diagnostics from this hospitalization (including imaging, microbiology, ancillary and laboratory) are listed below for reference.     Microbiology: Recent Results (from the past 240 hour(s))  Blood culture (routine x 2)     Status: None (Preliminary result)   Collection Time: 11/12/22  1:39 PM   Specimen: BLOOD  Result Value Ref Range Status   Specimen Description   Final    BLOOD SITE NOT SPECIFIED Performed at [facility], [address] [place], [place] [address]    Special Requests   Final    BOTTLES DRAWN AEROBIC AND ANAEROBIC Blood Culture adequate volume Performed at [facility], [address] [place], [place] [address]    Culture  Setup Time   Final    GRAM POSITIVE COCCI IN CLUSTERS IN BOTH AEROBIC AND ANAEROBIC BOTTLES CRITICAL  RESULT CALLED TO, READ BACK BY AND VERIFIED WITH: PHARMD T.GREEN AT 0904 ON 11/13/2022 BY T.SAAD. Performed at [facility], [address] [place], [place] [address]    Culture GRAM POSITIVE COCCI  Final   Report Status PENDING  Incomplete  Blood Culture ID Panel (Reflexed)     Status: Abnormal   Collection Time: 11/12/22  1:39 PM  Result Value Ref Range Status   Enterococcus faecalis NOT DETECTED NOT DETECTED Final   Enterococcus Faecium NOT DETECTED NOT DETECTED Final   Listeria monocytogenes NOT DETECTED NOT DETECTED Final   Staphylococcus species DETECTED (A) NOT DETECTED Final    Comment: CRITICAL RESULT CALLED TO, READ BACK BY AND VERIFIED WITH: PHARMD T.GREEN AT 0904 ON 11/13/2022 BY T.SAAD.    Staphylococcus aureus (BCID) NOT DETECTED NOT DETECTED Final   Staphylococcus epidermidis NOT DETECTED NOT DETECTED Final   Staphylococcus lugdunensis NOT DETECTED NOT DETECTED Final   Streptococcus species NOT DETECTED NOT DETECTED Final   Streptococcus agalactiae NOT DETECTED NOT DETECTED Final   Streptococcus pneumoniae NOT DETECTED NOT DETECTED Final   Streptococcus pyogenes NOT DETECTED NOT DETECTED Final   A.calcoaceticus-baumannii NOT DETECTED NOT DETECTED Final   Bacteroides fragilis NOT DETECTED NOT DETECTED Final   Enterobacterales NOT DETECTED NOT DETECTED Final   Enterobacter cloacae complex NOT DETECTED NOT DETECTED Final   Escherichia coli NOT DETECTED NOT DETECTED Final   Klebsiella aerogenes NOT DETECTED NOT DETECTED Final   Klebsiella oxytoca NOT DETECTED NOT DETECTED Final   Klebsiella pneumoniae NOT DETECTED NOT DETECTED Final   Proteus species NOT DETECTED NOT DETECTED Final   Salmonella species NOT DETECTED NOT DETECTED Final   Serratia marcescens NOT DETECTED NOT DETECTED Final   Haemophilus influenzae NOT DETECTED NOT DETECTED Final   Neisseria meningitidis NOT DETECTED NOT DETECTED Final   Pseudomonas aeruginosa NOT DETECTED NOT DETECTED Final    Stenotrophomonas maltophilia NOT DETECTED NOT DETECTED Final   Candida albicans NOT DETECTED NOT DETECTED Final   Candida auris NOT DETECTED NOT DETECTED Final   Candida glabrata NOT DETECTED NOT DETECTED Final   Candida krusei NOT DETECTED NOT DETECTED Final   Candida parapsilosis NOT DETECTED NOT DETECTED Final   Candida tropicalis NOT DETECTED NOT DETECTED Final   Cryptococcus neoformans/gattii NOT DETECTED NOT DETECTED Final    Comment: Performed at [facility], [address] [place], [place] [address]  Blood culture (routine x 2)     Status: None (Preliminary result)   Collection Time: 11/12/22  1:41 PM   Specimen: BLOOD  Result Value Ref Range Status   Specimen Description   Final    BLOOD SITE NOT SPECIFIED Performed at [facility], [address], [place], [place] [address]    Special Requests   Final    BOTTLES DRAWN AEROBIC AND ANAEROBIC Blood Culture adequate volume Performed at [facility] [facility], [address], [place], [place] [address]    Culture   Final    NO GROWTH < 24 HOURS Performed at [facility], [address], [place], [place] [address]    Report Status PENDING  Incomplete  Respiratory (~20 pathogens) panel by PCR     Status: None   Collection Time: 11/12/22  4:50 PM   Specimen: Nasopharyngeal Swab; Respiratory  Result Value Ref Range Status   Adenovirus NOT DETECTED NOT DETECTED Final   Coronavirus 229E NOT DETECTED NOT DETECTED Final    Comment: (NOTE) The Coronavirus on the Respiratory Panel, DOES NOT test for the novel  Coronavirus (2019 nCoV)    Coronavirus HKU1 NOT DETECTED NOT DETECTED Final   Coronavirus NL63 NOT DETECTED NOT DETECTED Final   Coronavirus OC43 NOT DETECTED NOT DETECTED Final   Metapneumovirus NOT DETECTED NOT DETECTED Final   Rhinovirus / Enterovirus NOT DETECTED NOT DETECTED Final   Influenza A NOT DETECTED NOT DETECTED Final   Influenza B NOT DETECTED NOT DETECTED Final    Parainfluenza Virus 1 NOT DETECTED NOT DETECTED Final   Parainfluenza Virus 2 NOT DETECTED NOT DETECTED Final   Parainfluenza Virus 3 NOT DETECTED NOT DETECTED Final   Parainfluenza Virus 4 NOT DETECTED NOT DETECTED Final   Respiratory Syncytial Virus NOT DETECTED NOT DETECTED Final   Bordetella pertussis NOT DETECTED NOT DETECTED Final   Bordetella Parapertussis NOT DETECTED NOT DETECTED Final   Chlamydophila pneumoniae NOT DETECTED NOT DETECTED Final   Mycoplasma pneumoniae NOT DETECTED NOT DETECTED Final    Comment: Performed at [facility], [address] [place], [place] [address]  SARS Coronavirus 2 by RT PCR (hospital order, performed in [facility] hospital lab) *cepheid single result test* Anterior Nasal Swab     Status: None   Collection Time: 11/12/22  4:50 PM   Specimen: Anterior Nasal Swab  Result Value Ref Range Status   SARS Coronavirus 2 by RT PCR NEGATIVE NEGATIVE Final    Comment: (NOTE) SARS-CoV-2 target nucleic acids are NOT DETECTED.  The SARS-CoV-2 RNA is generally detectable in upper and lower respiratory specimens during the acute phase of infection. The lowest concentration of SARS-CoV-2 viral copies this assay can detect is 250 copies / mL. A negative result does not preclude SARS-CoV-2 infection and should not be used as the sole basis for treatment or other patient management decisions.  A negative result may occur with improper specimen collection / handling, submission of specimen other than nasopharyngeal swab, presence of viral mutation(s) within the areas targeted by this assay, and inadequate number of viral copies (<250 copies / mL). A negative result must be combined with clinical observations, patient history, and epidemiological information.  Fact Sheet for Patients:   RoadLapTop.co.za  Fact Sheet for Healthcare Providers: http://kim-miller.com/  This test is not yet approved or  cleared by  the [place] FDA and has been authorized for detection and/or diagnosis of SARS-CoV-2 by FDA under an Emergency Use Authorization (EUA).  This EUA will remain in effect (meaning this test can be used) for the duration of the COVID-19 declaration  under Section 564(b)(1) of the Act, 21 U.S.C. section 360bbb-3(b)(1), unless the authorization is terminated or revoked sooner.  Performed at [facility], [address], [place], [place] [address]   MRSA Next Gen by PCR, Nasal     Status: None   Collection Time: 11/12/22 10:05 PM   Specimen: Nasal Mucosa; Nasal Swab  Result Value Ref Range Status   MRSA by PCR Next Gen NOT DETECTED NOT DETECTED Final    Comment: (NOTE) The GeneXpert MRSA Assay (FDA approved for NASAL specimens only), is one component of a comprehensive MRSA colonization surveillance program. It is not intended to diagnose MRSA infection nor to guide or monitor treatment for MRSA infections. Test performance is not FDA approved in patients less than 69 years old. Performed at [facility], [address], [place], [place] [address]      Labs: BNP (last 3 results) Recent Labs    12/24/21 1716  BNP 159.1*   Basic Metabolic Panel: Recent Labs  Lab 11/12/22 1302 11/12/22 1720 11/13/22 0300 11/14/22 0534  NA 143  --  140 139  K 3.0*  --  3.1* 2.8*  CL 82*  --  88* 89*  CO2 >45*  --  39* 43*  GLUCOSE 140*  --  115* 95  BUN 18  --  16 13  CREATININE 0.55 0.44 0.45 0.34*  CALCIUM 8.9  --  8.6* 8.6*  MG  --   --  1.4* 1.9  PHOS  --   --  2.8  --    Liver Function Tests: Recent Labs  Lab 11/12/22 1302 11/13/22 0300  AST 41 33  ALT 21 18  ALKPHOS 69 56  BILITOT 1.2 1.2  PROT 5.5* 4.6*  ALBUMIN 2.6* 2.3*   No results for input(s): "LIPASE", "AMYLASE" in the last 168 hours. No results for input(s): "AMMONIA" in the last 168 hours. CBC: Recent Labs  Lab 11/12/22 1302 11/12/22 1720 11/13/22 0300  WBC 10.8*  9.3 10.4  HGB 10.6* 10.4* 10.7*  HCT 34.8* 34.0* 33.6*  MCV 94.6 93.2 91.1  PLT 224 231 259   Cardiac Enzymes: No results for input(s): "CKTOTAL", "CKMB", "CKMBINDEX", "TROPONINI" in the last 168 hours. BNP: Invalid input(s): "POCBNP" CBG: No results for input(s): "GLUCAP" in the last 168 hours. D-Dimer Recent Labs    11/12/22 1302  DDIMER 1.01*   Hgb A1c No results for input(s): "HGBA1C" in the last 72 hours. Lipid Profile No results for input(s): "CHOL", "HDL", "LDLCALC", "TRIG", "CHOLHDL", "LDLDIRECT" in the last 72 hours. Thyroid function studies No results for input(s): "TSH", "T4TOTAL", "T3FREE", "THYROIDAB" in the last 72 hours.  Invalid input(s): "FREET3" Anemia work up No results for input(s): "VITAMINB12", "FOLATE", "FERRITIN", "TIBC", "IRON", "RETICCTPCT" in the last 72 hours. Urinalysis    Component Value Date/Time   COLORURINE YELLOW 11/12/2022 1752   APPEARANCEUR CLEAR 11/12/2022 1752   LABSPEC >1.046 (H) 11/12/2022 1752   PHURINE 6.0 11/12/2022 1752   GLUCOSEU NEGATIVE 11/12/2022 1752   HGBUR NEGATIVE 11/12/2022 1752   BILIRUBINUR NEGATIVE 11/12/2022 1752   KETONESUR 80 (A) 11/12/2022 1752   PROTEINUR 30 (A) 11/12/2022 1752   NITRITE NEGATIVE 11/12/2022 1752   LEUKOCYTESUR TRACE (A) 11/12/2022 1752   Sepsis Labs Recent Labs  Lab 11/12/22 1302 11/12/22 1720 11/13/22 0300  WBC 10.8* 9.3 10.4   Microbiology Recent Results (from the past 240 hour(s))  Blood culture (routine x 2)     Status: None (Preliminary result)   Collection Time:  11/12/22  1:39 PM   Specimen: BLOOD  Result Value Ref Range Status   Specimen Description   Final    BLOOD SITE NOT SPECIFIED Performed at [facility], [address] [place], [place] [address]    Special Requests   Final    BOTTLES DRAWN AEROBIC AND ANAEROBIC Blood Culture adequate volume Performed at [facility], [address], [place], [place] [address]    Culture   Setup Time   Final    GRAM POSITIVE COCCI IN CLUSTERS IN BOTH AEROBIC AND ANAEROBIC BOTTLES CRITICAL RESULT CALLED TO, READ BACK BY AND VERIFIED WITH: PHARMD T.GREEN AT 0904 ON 11/13/2022 BY T.SAAD. Performed at [facility], [address], [place], [place] [address]    Culture GRAM POSITIVE COCCI  Final   Report Status PENDING  Incomplete  Blood Culture ID Panel (Reflexed)     Status: Abnormal   Collection Time: 11/12/22  1:39 PM  Result Value Ref Range Status   Enterococcus faecalis NOT DETECTED NOT DETECTED Final   Enterococcus Faecium NOT DETECTED NOT DETECTED Final   Listeria monocytogenes NOT DETECTED NOT DETECTED Final   Staphylococcus species DETECTED (A) NOT DETECTED Final    Comment: CRITICAL RESULT CALLED TO, READ BACK BY AND VERIFIED WITH: PHARMD T.GREEN AT 0904 ON 11/13/2022 BY T.SAAD.    Staphylococcus aureus (BCID) NOT DETECTED NOT DETECTED Final   Staphylococcus epidermidis NOT DETECTED NOT DETECTED Final   Staphylococcus lugdunensis NOT DETECTED NOT DETECTED Final   Streptococcus species NOT DETECTED NOT DETECTED Final   Streptococcus agalactiae NOT DETECTED NOT DETECTED Final   Streptococcus pneumoniae NOT DETECTED NOT DETECTED Final   Streptococcus pyogenes NOT DETECTED NOT DETECTED Final   A.calcoaceticus-baumannii NOT DETECTED NOT DETECTED Final   Bacteroides fragilis NOT DETECTED NOT DETECTED Final   Enterobacterales NOT DETECTED NOT DETECTED Final   Enterobacter cloacae complex NOT DETECTED NOT DETECTED Final   Escherichia coli NOT DETECTED NOT DETECTED Final   Klebsiella aerogenes NOT DETECTED NOT DETECTED Final   Klebsiella oxytoca NOT DETECTED NOT DETECTED Final   Klebsiella pneumoniae NOT DETECTED NOT DETECTED Final   Proteus species NOT DETECTED NOT DETECTED Final   Salmonella species NOT DETECTED NOT DETECTED Final   Serratia marcescens NOT DETECTED NOT DETECTED Final   Haemophilus influenzae NOT DETECTED NOT DETECTED Final   Neisseria  meningitidis NOT DETECTED NOT DETECTED Final   Pseudomonas aeruginosa NOT DETECTED NOT DETECTED Final   Stenotrophomonas maltophilia NOT DETECTED NOT DETECTED Final   Candida albicans NOT DETECTED NOT DETECTED Final   Candida auris NOT DETECTED NOT DETECTED Final   Candida glabrata NOT DETECTED NOT DETECTED Final   Candida krusei NOT DETECTED NOT DETECTED Final   Candida parapsilosis NOT DETECTED NOT DETECTED Final   Candida tropicalis NOT DETECTED NOT DETECTED Final   Cryptococcus neoformans/gattii NOT DETECTED NOT DETECTED Final    Comment: Performed at [facility], [address], [place], [place] [address]  Blood culture (routine x 2)     Status: None (Preliminary result)   Collection Time: 11/12/22  1:41 PM   Specimen: BLOOD  Result Value Ref Range Status   Specimen Description   Final    BLOOD SITE NOT SPECIFIED Performed at [facility], [address], [place], [place] [address]    Special Requests   Final    BOTTLES DRAWN AEROBIC AND ANAEROBIC Blood Culture adequate volume Performed at [facility], [address] [place],  [place] [address]    Culture   Final    NO GROWTH < 24 HOURS Performed at [facility], [address] [place], [place] [address]    Report Status PENDING  Incomplete  Respiratory (~20 pathogens) panel by PCR     Status: None   Collection Time: 11/12/22  4:50 PM   Specimen: Nasopharyngeal Swab; Respiratory  Result Value Ref Range Status   Adenovirus NOT DETECTED NOT DETECTED Final   Coronavirus 229E NOT DETECTED NOT DETECTED Final    Comment: (NOTE) The Coronavirus on the Respiratory Panel, DOES NOT test for the novel  Coronavirus (2019 nCoV)    Coronavirus HKU1 NOT DETECTED NOT DETECTED Final   Coronavirus NL63 NOT DETECTED NOT DETECTED Final   Coronavirus OC43 NOT DETECTED NOT DETECTED Final   Metapneumovirus NOT DETECTED NOT DETECTED Final   Rhinovirus / Enterovirus NOT DETECTED NOT DETECTED  Final   Influenza A NOT DETECTED NOT DETECTED Final   Influenza B NOT DETECTED NOT DETECTED Final   Parainfluenza Virus 1 NOT DETECTED NOT DETECTED Final   Parainfluenza Virus 2 NOT DETECTED NOT DETECTED Final   Parainfluenza Virus 3 NOT DETECTED NOT DETECTED Final   Parainfluenza Virus 4 NOT DETECTED NOT DETECTED Final   Respiratory Syncytial Virus NOT DETECTED NOT DETECTED Final   Bordetella pertussis NOT DETECTED NOT DETECTED Final   Bordetella Parapertussis NOT DETECTED NOT DETECTED Final   Chlamydophila pneumoniae NOT DETECTED NOT DETECTED Final   Mycoplasma pneumoniae NOT DETECTED NOT DETECTED Final    Comment: Performed at [facility], [address] [place], [place] [address]  SARS Coronavirus 2 by RT PCR (hospital order, performed in [facility] hospital lab) *cepheid single result test* Anterior Nasal Swab     Status: None   Collection Time: 11/12/22  4:50 PM   Specimen: Anterior Nasal Swab  Result Value Ref Range Status   SARS Coronavirus 2 by RT PCR NEGATIVE NEGATIVE Final    Comment: (NOTE) SARS-CoV-2 target nucleic acids are NOT DETECTED.  The SARS-CoV-2 RNA is generally detectable in upper and lower respiratory specimens during the acute phase of infection. The lowest concentration of SARS-CoV-2 viral copies this assay can detect is 250 copies / mL. A negative result does not preclude SARS-CoV-2 infection and should not be used as the sole basis for treatment or other patient management decisions.  A negative result may occur with improper specimen collection / handling, submission of specimen other than nasopharyngeal swab, presence of viral mutation(s) within the areas targeted by this assay, and inadequate number of viral copies (<250 copies / mL). A negative result must be combined with clinical observations, patient history, and epidemiological information.  Fact Sheet for Patients:   RoadLapTop.co.za  Fact Sheet for Healthcare  Providers: http://kim-miller.com/  This test is not yet approved or  cleared by the [place] FDA and has been authorized for detection and/or diagnosis of SARS-CoV-2 by FDA under an Emergency Use Authorization (EUA).  This EUA will remain in effect (meaning this test can be used) for the duration of the COVID-19 declaration under Section 564(b)(1) of the Act, 21 U.S.C. section 360bbb-3(b)(1), unless the authorization is terminated or revoked sooner.  Performed at [facility], [address] [place], [place] [address]   MRSA Next Gen by PCR, Nasal     Status: None   Collection Time: 11/12/22 10:05 PM   Specimen: Nasal Mucosa; Nasal Swab  Result Value Ref Range Status   MRSA by  PCR Next Gen NOT DETECTED NOT DETECTED Final    Comment: (NOTE) The GeneXpert MRSA Assay (FDA approved for NASAL specimens only), is one component of a comprehensive MRSA colonization surveillance program. It is not intended to diagnose MRSA infection nor to guide or monitor treatment for MRSA infections. Test performance is not FDA approved in patients less than 38 years old. Performed at [facility], [address], [place], [place] [address]      Time coordinating discharge: 35 minutes  SIGNED:   Glade Lloyd, MD  [facility] 11/14/2022, 8:24 AM

## 2022-11-14 NOTE — TOC Initial Note (Signed)
Transition of Care [facility]) - Initial/Assessment Note    Patient Details  Name: Christine Serrano MRN: [id number] Date of Birth: [birth year]  Transition of Care [facility]) CM/SW Contact:    Coralyn Helling, LCSW Phone Number: 11/14/2022, 10:45 AM  Clinical Narrative:      Patient from home with roommate. Patient reports using a power chair at baseline. Patient reports her roommate helps her most of the time, but her roommate does work outside of the home. Patient hopes to go home with hospice. TOC made referral to [facility]. Patient reports she does not need a hospital bed and that her bed goes up and down.               PLAN: Home with hospice.   Expected Discharge Plan: Home w Hospice Care Barriers to Discharge: Continued Medical Work up   Patient Goals and CMS Choice Patient states their goals for this hospitalization and ongoing recovery are:: Goo home with hospice.          Expected Discharge Plan and Services In-house Referral: NA Discharge Planning Services: NA Post Acute Care Choice: Hospice Living arrangements for the past 2 months: Single Family Home Expected Discharge Date: 11/14/22               DME Arranged: N/A DME Agency: NA       HH Arranged: NA HH Agency: NA        Prior Living Arrangements/Services Living arrangements for the past 2 months: Single Family Home Lives with:: Roommate Patient language and need for interpreter reviewed:: Yes Do you feel safe going back to the place where you live?: Yes      Need for Family Participation in Patient Care: Yes (Comment) Care giver support system in place?: Yes (comment) Current home services: DME Criminal Activity/Legal Involvement Pertinent to Current Situation/Hospitalization: No - Comment as needed  Activities of Daily Living Home Assistive Devices/Equipment: Bedside commode/3-in-1 ADL Screening (condition at time of admission) Patient's cognitive ability adequate to safely complete daily activities?:  Yes Is the patient deaf or have difficulty hearing?: No Does the patient have difficulty seeing, even when wearing glasses/contacts?: No Does the patient have difficulty concentrating, remembering, or making decisions?: No Patient able to express need for assistance with ADLs?: Yes Does the patient have difficulty dressing or bathing?: No Independently performs ADLs?: Yes (appropriate for developmental age) Does the patient have difficulty walking or climbing stairs?: Yes Weakness of Legs: Both Weakness of Arms/Hands: Both  Permission Sought/Granted Permission sought to share information with : Family Supports Permission granted to share information with : Yes, Verbal Permission Granted  Share Information with NAME: Marchelle Folks Rupard  Permission granted to share info w AGENCY: [facility]  Permission granted to share info w Relationship: Roommate/ HCPOA     Emotional Assessment Appearance:: Appears older than stated age Attitude/Demeanor/Rapport: Engaged Affect (typically observed): Accepting Orientation: : Oriented to Self, Oriented to Place, Oriented to  Time, Oriented to Situation Alcohol / Substance Use: Not Applicable Psych Involvement: No (comment)  Admission diagnosis:  Shortness of breath [R06.02] Hypercarbia [R06.89] Acute respiratory failure with hypoxia and hypercapnia (HCC) [J96.01, J96.02] Community acquired pneumonia, unspecified laterality [J18.9] Patient Active Problem List   Diagnosis Date Noted   Acute on chronic respiratory failure with hypoxia and hypercapnia (HCC) 11/13/2022   Acute respiratory failure with hypoxia and hypercapnia (HCC) 11/12/2022   Left leg swelling 07/23/2022   Right lower quadrant abdominal pain 11/29/2021   Unintentional weight loss 11/29/2021   Protein-calorie malnutrition (HCC)  11/29/2021   Arm paresthesia, right 08/31/2021   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   Arm injury, right, initial encounter 10/24/2020   Fall with  injury 10/24/2020   Traumatic hematoma of scalp 10/24/2020   MRSA infection 08/23/2020   Dyspnea on exertion 08/23/2020   Abscess of axilla, left 08/14/2020   Low serum vitamin B12 06/09/2020   Cervical neck pain with evidence of disc disease 01/29/2020   Hospice care patient 10/23/2019   Impaired mobility 10/20/2019   Chronic respiratory failure with hypoxia, on home O2 therapy (HCC) 10/20/2019   Memory difficulties 10/16/2019   Folate deficiency 09/17/2019   Dizziness 09/11/2019   IDA (iron deficiency anemia) 09/11/2019   Hypocalcemia 09/11/2019   Systolic murmur 02/27/2019   Vertebral artery stenosis, left 02/27/2019   Medicare annual wellness visit, subsequent 02/25/2019   Advanced directives, counseling/discussion 02/25/2019   Epistaxis 09/21/2018   COPD exacerbation (HCC) 09/02/2018   Osteoporosis 05/23/2018   Watery diarrhea 05/08/2018   Numbness of right hand 10/10/2017   Benign neoplasm of ascending colon    Benign neoplasm of transverse colon    Benign neoplasm of sigmoid colon    Positive hepatitis C antibody test 02/23/2017   Encounter for general adult medical examination with abnormal findings 02/21/2017   HLD (hyperlipidemia) 02/21/2017   Generalized anxiety disorder with panic attacks 12/10/2016   Community acquired pneumonia 12/10/2016   Tobacco abuse 11/28/2016   Aortic atherosclerosis (HCC) 11/26/2016   Tachycardia 11/01/2016   Pulmonary nodule 10/25/2016   MDD (major depressive disorder), recurrent episode, severe (HCC) 07/03/2016   Chronic constipation 07/03/2016   Alcohol use 07/03/2016   COPD, very severe (HCC) 07/03/2016   Tremor 07/03/2016   Chronic insomnia 07/03/2016   PCP:  Eustaquio Boyden, MD Pharmacy:   [facility] - [place], [place] - [address] [place] [address] Phone: [phone] Fax: [phone]  [id number] - [place], [place] - [address] [place]  [address] Phone: [phone] Fax: [phone]  [facility] [place], [place] - [address] [place] [address] Phone: [phone] Fax: [phone]  [facility] - [place], [place] - [address] [place] [address] Phone: [phone] Fax: [phone]     Social Determinants of Health (SDOH) Social History: SDOH Screenings   Food Insecurity: No Food Insecurity (11/14/2022)  Housing: Low Risk  (11/14/2022)  Transportation Needs: No Transportation Needs (11/14/2022)  Utilities: Not At Risk (11/14/2022)  Alcohol Screen: Low Risk  (10/01/2022)  Depression (PHQ2-9): High Risk (10/01/2022)  Financial Resource Strain: Low Risk  (10/01/2022)  Physical Activity: Inactive (10/01/2022)  Social Connections: Socially Isolated (10/01/2022)  Stress: Stress Concern Present (10/01/2022)  Tobacco Use: High Risk (11/12/2022)   SDOH Interventions:     Readmission Risk Interventions    11/14/2022   10:20 AM  Readmission Risk Prevention Plan  Transportation Screening Complete  Home Care Screening Complete

## 2022-11-14 NOTE — TOC Transition Note (Signed)
Transition of Care [facility]) - CM/SW Discharge Note   Patient Details  Name: Christine Serrano MRN: [id number] Date of Birth: [birth year]  [facility] CM/SW Contact:  Otelia Santee, LCSW Phone Number: 11/14/2022, 2:32 PM   Clinical Narrative:    Pt will be picked up by roommate tonight to return home with hospice care through [facility].    Final next level of care: Home w Hospice Care Barriers to Discharge: Continued Medical Work up   Patient Goals and CMS Choice      Discharge Placement                         Discharge Plan and Services Additional resources added to the After Visit Summary for   In-house Referral: NA Discharge Planning Services: NA Post Acute Care Choice: Hospice          DME Arranged: N/A DME Agency: NA       HH Arranged: NA HH Agency: NA        Social Determinants of Health (SDOH) Interventions SDOH Screenings   Food Insecurity: No Food Insecurity (11/14/2022)  Housing: Low Risk  (11/14/2022)  Transportation Needs: No Transportation Needs (11/14/2022)  Utilities: Not At Risk (11/14/2022)  Alcohol Screen: Low Risk  (10/01/2022)  Depression (PHQ2-9): High Risk (10/01/2022)  Financial Resource Strain: Low Risk  (10/01/2022)  Physical Activity: Inactive (10/01/2022)  Social Connections: Socially Isolated (10/01/2022)  Stress: Stress Concern Present (10/01/2022)  Tobacco Use: High Risk (11/12/2022)     Readmission Risk Interventions    11/14/2022   10:20 AM  Readmission Risk Prevention Plan  Transportation Screening Complete  Home Care Screening Complete

## 2022-11-14 NOTE — Care Management Important Message (Signed)
Important Message  Patient Details IM Letter given. Name: Christine Serrano MRN: [id number] Date of Birth: 1961/02/28   Medicare Important Message Given:  Yes     Caren Macadam 11/14/2022, 9:36 AM

## 2022-11-14 NOTE — Progress Notes (Signed)
Physical Therapy Evaluation Patient Details Name: Christine Serrano MRN: [id number] DOB: May 03, 1961 Today's Date: 11/14/2022  History of Present Illness  62 yo female with onset of cough, SOB had acute resp failure and PNA was admitted 5.28, has lung mass and concerns about hypotension.  PMHx:  COPD, hypoxic resp failure, on 4L O2, depression, tobacco use,  Clinical Impression  Pt is evaluated for mobility and has BP check due to feeling light headed recently.  Noted supine 89/60 pulse 119, sat 95%; sitting 91/70 pulse 116, sat 98%;  standing and sudden sit 92/70 pulse 123, sats 97%.  Pt is expected to go home with hospice care, has equipment in place and will follow up with hospice services as are deemed appropriate.  PT will assist if dc does not occur today.       Recommendations for follow up therapy are one component of a multi-disciplinary discharge planning process, led by the attending physician.  Recommendations may be updated based on patient status, additional functional criteria and insurance authorization.  Follow Up Recommendations       Assistance Recommended at Discharge Frequent or constant Supervision/Assistance  Patient can return home with the following  A lot of help with walking and/or transfers;A lot of help with bathing/dressing/bathroom;Assistance with cooking/housework;Assist for transportation;Help with stairs or ramp for entrance    Equipment Recommendations None recommended by PT  Recommendations for Other Services       Functional Status Assessment Patient has had a recent decline in their functional status and/or demonstrates limited ability to make significant improvements in function in a reasonable and predictable amount of time     Precautions / Restrictions Precautions Precautions: Fall Precaution Comments: monitor vitals, light headed to move Restrictions Weight Bearing Restrictions: No      Mobility  Bed Mobility Overal bed mobility: Needs  Assistance Bed Mobility: Supine to Sit, Sit to Supine     Supine to sit: Min assist Sit to supine: Min assist   General bed mobility comments: min assist to scoot out on bed and back    Transfers Overall transfer level: Needs assistance Equipment used: Rolling walker (2 wheels) Transfers: Sit to/from Stand Sit to Stand: Min assist           General transfer comment: sits suddenly as she becomes light headed and noted drop but not bottom out of BP, HR elevated    Ambulation/Gait               General Gait Details: unable  Stairs            Wheelchair Mobility    Modified Rankin (Stroke Patients Only)       Balance Overall balance assessment: Needs assistance Sitting-balance support: Feet supported Sitting balance-Leahy Scale: Fair     Standing balance support: Bilateral upper extremity supported, During functional activity Standing balance-Leahy Scale: Poor                               Pertinent Vitals/Pain Pain Assessment Pain Assessment: Faces Faces Pain Scale: No hurt    Home Living Family/patient expects to be discharged to:: Private residence Living Arrangements: Non-relatives/Friends Available Help at Discharge: Friend(s);Available 24 hours/day Type of Home: House         Home Layout: One level        Prior Function Prior Level of Function : Needs assist       Physical Assist : Mobility (physical) Mobility (  physical): Gait   Mobility Comments: rollator walker per pt       Hand Dominance   Dominant Hand: Right    Extremity/Trunk Assessment   Upper Extremity Assessment Upper Extremity Assessment: Generalized weakness    Lower Extremity Assessment Lower Extremity Assessment: Generalized weakness    Cervical / Trunk Assessment Cervical / Trunk Assessment: Kyphotic (mild)  Communication   Communication: No difficulties  Cognition Arousal/Alertness: Awake/alert Behavior During Therapy: Flat  affect Overall Cognitive Status: Within Functional Limits for tasks assessed                                          General Comments General comments (skin integrity, edema, etc.): Pt was assisted to stand up and could only remain up a few seconds, BP assessed from sitting after trying standing and could not walk    Exercises     Assessment/Plan    PT Assessment Patient needs continued PT services  PT Problem List Decreased strength;Decreased activity tolerance;Decreased balance       PT Treatment Interventions DME instruction;Gait training;Functional mobility training;Therapeutic activities;Therapeutic exercise;Balance training;Neuromuscular re-education;Patient/family education    PT Goals (Current goals can be found in the Care Plan section)  Acute Rehab PT Goals Patient Stated Goal: to go home today PT Goal Formulation: With patient Time For Goal Achievement: 11/21/22 Potential to Achieve Goals: Fair    Frequency Min 3X/week     Co-evaluation               AM-PAC PT "6 Clicks" Mobility  Outcome Measure Help needed turning from your back to your side while in a flat bed without using bedrails?: A Little Help needed moving from lying on your back to sitting on the side of a flat bed without using bedrails?: A Little Help needed moving to and from a bed to a chair (including a wheelchair)?: A Little Help needed standing up from a chair using your arms (e.g., wheelchair or bedside chair)?: A Little Help needed to walk in hospital room?: Total Help needed climbing 3-5 steps with a railing? : Total 6 Click Score: 14    End of Session Equipment Utilized During Treatment: Gait belt;Oxygen Activity Tolerance: Patient limited by fatigue;Treatment limited secondary to medical complications (Comment) Patient left: in bed;with call bell/phone within reach;with bed alarm set Nurse Communication: Mobility status PT Visit Diagnosis: Unsteadiness on feet  (R26.81);Muscle weakness (generalized) (M62.81)    Time: 1610-9604 PT Time Calculation (min) (ACUTE ONLY): 19 min   Charges:   PT Evaluation $PT Eval Moderate Complexity: 1 Mod         Ivar Drape 11/14/2022, 1:06 PM  Samul Dada, PT PhD Acute Rehab Dept. Number: [facility] [id number] and [facility] [phone]

## 2022-11-14 NOTE — TOC Progression Note (Signed)
Transition of Care [facility]) - Progression Note    Patient Details  Name: Christine Serrano MRN: [id number] Date of Birth: 10-26-1960  Transition of [facility]) CM/SW Contact  Coralyn Helling, [place] Phone Number: 11/14/2022, 11:49 AM  Clinical Narrative:   TOC notified via secure chat by rn and hospice liaison that patients roommate prefers dc tomorrow. She is off work at &pm and back to work in the am. She prefers for patient not to be home alone her first day back. Hospice liaison is aware.     Expected Discharge Plan: Home w Hospice Care Barriers to Discharge: Continued Medical Work up  Expected Discharge Plan and Services In-house Referral: NA Discharge Planning Services: NA Post Acute Care Choice: Hospice Living arrangements for the past 2 months: Single Family Home Expected Discharge Date: 11/14/22               DME Arranged: N/A DME Agency: NA       HH Arranged: NA HH Agency: NA         Social Determinants of Health (SDOH) Interventions SDOH Screenings   Food Insecurity: No Food Insecurity (11/14/2022)  Housing: Low Risk  (11/14/2022)  Transportation Needs: No Transportation Needs (11/14/2022)  Utilities: Not At Risk (11/14/2022)  Alcohol Screen: Low Risk  (10/01/2022)  Depression (PHQ2-9): High Risk (10/01/2022)  Financial Resource Strain: Low Risk  (10/01/2022)  Physical Activity: Inactive (10/01/2022)  Social Connections: Socially Isolated (10/01/2022)  Stress: Stress Concern Present (10/01/2022)  Tobacco Use: High Risk (11/12/2022)    Readmission Risk Interventions    11/14/2022   10:20 AM  Readmission Risk Prevention Plan  Transportation Screening Complete  Home Care Screening Complete

## 2022-11-15 LAB — CULTURE, BLOOD (ROUTINE X 2): Special Requests: ADEQUATE

## 2022-11-15 NOTE — Progress Notes (Signed)
Palliative Medicine Progress Note   Patient Name: Christine Serrano       Date: 11/15/2022 DOB: [birth year]  Age: 62 y.o. MRN#: [id number] Attending Physician: No att. providers found Primary Care Physician: Eustaquio Boyden, MD Admit Date: 11/12/2022  Reason for Consultation/Follow-up: {Reason for Consult:23484}  HPI/Patient Profile: 62 y.o. female  with past medical history of severe COPD and chronic respiratory failure on home oxygen, pulmonary cachexia, and failure to thrive who presented to the ED on 11/12/2022 with increasing shortness of breath and cough.  CTA chest showed possible lung cancer versus pneumonia, and associated small effusion.  She was initially on BiPAP and admitted to the ICU.   Subjective: ***  Objective:  Physical Exam          Vital Signs: BP (!) 86/60 (BP Location: Right Arm)   Pulse 98   Temp 97.8 F (36.6 C) (Oral)   Resp 15   Ht 5\' 5"  (1.651 m)   Wt 37.7 kg   LMP  (LMP Unknown)   SpO2 100%   BMI 13.83 kg/m  SpO2: SpO2: 100 % O2 Device: O2 Device: Nasal Cannula O2 Flow Rate: O2 Flow Rate (L/min): 4 L/min  Intake/output summary: No intake or output data in the 24 hours ending 11/15/22 0805  LBM:       Palliative Assessment/Data: ***     Palliative Medicine Assessment & Plan   Assessment: Principal Problem:   Acute respiratory failure with hypoxia and hypercapnia (HCC) Active Problems:   MDD (major depressive disorder), recurrent episode, severe (HCC)   COPD, very severe (HCC)   Tobacco abuse   Generalized anxiety disorder with panic attacks   Community acquired pneumonia   HLD (hyperlipidemia)   COPD exacerbation (HCC)   Chronic respiratory failure with hypoxia, on home O2 therapy (HCC)   Acute on chronic respiratory failure with  hypoxia and hypercapnia (HCC)    Recommendations/Plan: ***  Goals of Care and Additional Recommendations: Limitations on Scope of Treatment: {Recommended Scope and Preferences:21019}  Code Status:   Prognosis:  {Palliative Care Prognosis:23504}  Discharge Planning: {Palliative dispostion:23505}  Care plan was discussed with ***  Thank you for allowing the Palliative Medicine Team to assist in the care of this patient.   ***   Merry Proud, NP   Please contact Palliative Medicine Team phone  at [phone] for questions and concerns.  For individual providers, please see AMION.

## 2022-11-15 NOTE — Telephone Encounter (Signed)
It appears that pt was discharged on hospice. F/u still needed?

## 2022-11-16 LAB — CULTURE, BLOOD (ROUTINE X 2): Culture: NO GROWTH

## 2022-11-17 LAB — CULTURE, BLOOD (ROUTINE X 2): Special Requests: ADEQUATE

## 2022-11-18 ENCOUNTER — Telehealth: Payer: Self-pay | Admitting: Family Medicine

## 2022-11-18 DIAGNOSIS — R918 Other nonspecific abnormal finding of lung field: Secondary | ICD-10-CM | POA: Insufficient documentation

## 2022-11-18 NOTE — Telephone Encounter (Signed)
Dr. Jayme Cloud is out of office until 11/25/2022. Will await response.

## 2022-11-18 NOTE — Telephone Encounter (Signed)
Do you want me to set up virtual to review with patient?

## 2022-11-18 NOTE — Telephone Encounter (Addendum)
I don't mind ordering portable oxygen concentrator. I think the issue has been that hospice may not cover portable oxygen concentrator and this was one reason she was hesitant to go on hospice.  If she agrees, reasonable to ask for virtual visit or would touch base with home hospice to see if they'll cover this. I've written Rx for this and placed in Lisa's box.

## 2022-11-18 NOTE — Telephone Encounter (Signed)
Pt called stating she was discharged from the hosp on 5/31. Pt states she was diagnosed with lung cancer & suggested to be put on Hospice. Pt states [employer] [facility] visited her to discuss a portable concentrator & was told to ask Dr. Reece Agar to send in a rx for a portable concentrator. Pt states if she doesn't receive the portable concentrator, she won't be able to leave home. Pt requested a call from Dr. Reece Agar to discuss hospice. Call back # [phone]

## 2022-11-19 DIAGNOSIS — I5032 Chronic diastolic (congestive) heart failure: Secondary | ICD-10-CM | POA: Diagnosis not present

## 2022-11-19 DIAGNOSIS — J449 Chronic obstructive pulmonary disease, unspecified: Secondary | ICD-10-CM | POA: Diagnosis not present

## 2022-11-19 NOTE — Telephone Encounter (Addendum)
There's been a lot of back and forth with this patient regarding hospice.  If not going on hospice, will need hospital f/u visit with me - please schedule prior to 11/28/2022, ok to do virtual if she can't come into the office.

## 2022-11-19 NOTE — Telephone Encounter (Signed)
Spoke with pt relaying Dr. Timoteo Expose message. Pt verbalizes understanding but declines hospice. States her insurance should cover. Notified her I will fax order to [facility]. Pt expresses her thanks. Fyi to Dr. Reece Agar.   Faxed order to [employer] at [phone].

## 2022-11-20 NOTE — Telephone Encounter (Signed)
Per Tresa Endo, the patient is aware of her appt with Dr. Jayme Cloud on 12/10/2022.  Nothing further needed.

## 2022-11-20 NOTE — Telephone Encounter (Signed)
Pt. Called back is not enrolled in hospice

## 2022-11-20 NOTE — Telephone Encounter (Signed)
Lvmtcb

## 2022-11-20 NOTE — Telephone Encounter (Signed)
ATC the patient. LVM for the patient to return my call. 

## 2022-11-20 NOTE — Telephone Encounter (Signed)
If she is currently enrolled in hospice she does not need a follow-up appointment.  If she is not on hospice then she will need a follow-up appointment.

## 2022-11-21 DIAGNOSIS — J449 Chronic obstructive pulmonary disease, unspecified: Secondary | ICD-10-CM | POA: Diagnosis not present

## 2022-11-21 NOTE — Telephone Encounter (Signed)
Spoke with pt relaying Dr. Timoteo Expose message. Pt verbalizes understanding will call back today to  schedule hosp f/u OV or virtual visit before 11/28/22.

## 2022-11-22 NOTE — Telephone Encounter (Signed)
Left message to return call to our office.  

## 2022-11-22 NOTE — Telephone Encounter (Addendum)
Lvm asking pt to call back. Needs to schedule hosp f/u OV or virtual visit before 11/28/22.

## 2022-11-25 NOTE — Telephone Encounter (Signed)
Spoke with pt scheduling hosp f/u on 11/29/22 at 2:00, per Dr. Reece Agar.

## 2022-11-29 ENCOUNTER — Ambulatory Visit: Payer: 59 | Admitting: Family Medicine

## 2022-12-02 ENCOUNTER — Other Ambulatory Visit: Payer: 59

## 2022-12-04 DIAGNOSIS — J449 Chronic obstructive pulmonary disease, unspecified: Secondary | ICD-10-CM | POA: Diagnosis not present

## 2022-12-04 DIAGNOSIS — J9611 Chronic respiratory failure with hypoxia: Secondary | ICD-10-CM | POA: Diagnosis not present

## 2022-12-04 DIAGNOSIS — Z7409 Other reduced mobility: Secondary | ICD-10-CM | POA: Diagnosis not present

## 2022-12-09 ENCOUNTER — Encounter: Payer: 59 | Admitting: Family Medicine

## 2022-12-10 ENCOUNTER — Ambulatory Visit: Payer: Medicare HMO | Admitting: Pulmonary Disease

## 2022-12-17 ENCOUNTER — Ambulatory Visit: Payer: 59 | Admitting: Family Medicine

## 2022-12-23 ENCOUNTER — Ambulatory Visit: Payer: 59 | Admitting: Pulmonary Disease

## 2022-12-24 ENCOUNTER — Ambulatory Visit: Payer: Medicare HMO | Admitting: Family Medicine

## 2022-12-24 ENCOUNTER — Telehealth: Payer: Self-pay | Admitting: Family Medicine

## 2022-12-24 NOTE — Telephone Encounter (Signed)
Appointments missed on 5.30.24, 6.24.24, 7.2.24, 7.9.24 no shows and late cancellations Sending letter out  Blocked appointment scheduling until speak with the patient

## 2022-12-25 ENCOUNTER — Encounter: Payer: Self-pay | Admitting: Family Medicine

## 2022-12-25 NOTE — Telephone Encounter (Signed)
Noted and did not send out no show letter

## 2022-12-25 NOTE — Telephone Encounter (Signed)
Pending letter to Dr. Reece Agar re: missed appointments

## 2023-01-01 ENCOUNTER — Other Ambulatory Visit: Payer: Self-pay | Admitting: Family Medicine

## 2023-01-01 ENCOUNTER — Other Ambulatory Visit: Payer: Self-pay | Admitting: Pulmonary Disease

## 2023-01-01 DIAGNOSIS — J449 Chronic obstructive pulmonary disease, unspecified: Secondary | ICD-10-CM

## 2023-01-01 NOTE — Telephone Encounter (Signed)
Albuterol inh last filled:  09/10/22, #18 g Omeprazole last filled:  08/12/22, 90 Last OV:  08/16/22, COPD  Next OV:  03/26/23, hosp f/u

## 2023-01-03 ENCOUNTER — Other Ambulatory Visit: Payer: Self-pay | Admitting: Family Medicine

## 2023-01-03 DIAGNOSIS — F332 Major depressive disorder, recurrent severe without psychotic features: Secondary | ICD-10-CM

## 2023-01-03 DIAGNOSIS — J449 Chronic obstructive pulmonary disease, unspecified: Secondary | ICD-10-CM

## 2023-01-03 NOTE — Telephone Encounter (Signed)
Albuterol inh request already sent to Dr. Reece Agar (see 01/01/23 refill note).   Bupropion last rx:  12/12/21, #90 Naproxen last rx:  12/05/21, #60 Last OV:  08/16/22, COPD  Next OV:  03/26/23, hosp f/u

## 2023-01-03 NOTE — Telephone Encounter (Signed)
Prescription Request  01/03/2023  LOV: 07/22/2022  What is the name of the medication or equipment?  buPROPion (WELLBUTRIN XL) 150 MG 24 hr tablet  meclizine (ANTIVERT) 25 MG tablet    naproxen (NAPROSYN) 375 MG tablet   albuterol (PROAIR HFA) 108 (90 Base) MCG/ACT inhaler  Have you contacted your pharmacy to request a refill? Yes   Which pharmacy would you like this sent to?   [id number] Sheen, [place] - [address] [place] [address] Phone: [phone] Fax: [phone]     Patient notified that their request is being sent to the clinical staff for review and that they should receive a response within 2 business days.   Please advise at [facility] [phone]

## 2023-01-03 NOTE — Telephone Encounter (Signed)
Benzonatate Last filled:  09/10/22, #30 Last OV:  08/16/22, COPD  Next OV:  03/26/23, hosp f/u

## 2023-01-03 NOTE — Telephone Encounter (Signed)
Patient called in to make an appointment and get refills. I advised her that our office manager would like to speak with her before we are able to schedule. She will be looking forward to the phone call

## 2023-01-06 NOTE — Telephone Encounter (Signed)
ERx 

## 2023-01-09 ENCOUNTER — Telehealth: Payer: Self-pay | Admitting: Family Medicine

## 2023-01-09 MED ORDER — NAPROXEN 375 MG PO TABS: ORAL_TABLET | ORAL | 4 refills | Status: DC

## 2023-01-09 MED ORDER — BUPROPION HCL ER (XL) 150 MG PO TB24: 150.0000 mg | ORAL_TABLET | Freq: Every day | ORAL | 1 refills | Status: DC

## 2023-01-09 NOTE — Telephone Encounter (Signed)
[employer] and Ms Rosier called in to schedule her a hospital f/u appointment with Dr Reece Agar. I wasn't allowed to schedule this appointment and I seen that there was a note to not schedule her. I advised her that I would have my office manager call her and speak with her regarding this.

## 2023-01-09 NOTE — Telephone Encounter (Signed)
ERx 

## 2023-01-20 ENCOUNTER — Other Ambulatory Visit: Payer: Self-pay | Admitting: Family Medicine

## 2023-01-23 ENCOUNTER — Telehealth: Payer: Self-pay | Admitting: Pulmonary Disease

## 2023-01-23 NOTE — Telephone Encounter (Signed)
Per Dr. Jayme Cloud, she has not see the patient in over a year.   ATC the patient. Her voicemail box was full and I could not leave a message. I will try again later.

## 2023-01-24 NOTE — Telephone Encounter (Signed)
Lm for patient.  

## 2023-01-24 NOTE — Telephone Encounter (Signed)
Scheduled patient to see Dr. Reece Agar on 8.14.24 at 12 noon. Patient states she is able to get transportation through her insurance company.  Talked with the patient about importance of keeping her appointments and that if she missed another appointment, Dr. Sharen Hones may move forward with dismissal  Patient understood, apologized and committed to coming in for her appointment on Wednesday 8.14.24

## 2023-01-24 NOTE — Telephone Encounter (Signed)
Noted.  Fyi to Dr. G.  

## 2023-01-24 NOTE — Telephone Encounter (Signed)
Lm x1 for patient.  

## 2023-01-24 NOTE — Telephone Encounter (Signed)
Noted! Thank you

## 2023-01-24 NOTE — Telephone Encounter (Signed)
Pt called in to get some meds refilled pt didn't have the meds ready but will have them ready by the time of her call.

## 2023-01-24 NOTE — Telephone Encounter (Signed)
Patient called in to check date and time on appointment that she had scheduled, as well as request med refills. Advised patient that office manager had previously tried to reach out to her regarding missed appointments, that she would need to speak with her regarding this. Patient agreed and said she can be reached at [phone].

## 2023-01-27 ENCOUNTER — Other Ambulatory Visit: Payer: Self-pay | Admitting: Family Medicine

## 2023-01-27 DIAGNOSIS — F331 Major depressive disorder, recurrent, moderate: Secondary | ICD-10-CM

## 2023-01-27 DIAGNOSIS — F5104 Psychophysiologic insomnia: Secondary | ICD-10-CM

## 2023-01-27 DIAGNOSIS — F419 Anxiety disorder, unspecified: Secondary | ICD-10-CM

## 2023-01-27 NOTE — Telephone Encounter (Signed)
Dot Lanes from [facility] is checking on message for Albuterol solution. Dot Lanes phone number is [phone]. Fax number is [phone].

## 2023-01-27 NOTE — Telephone Encounter (Signed)
Sertraline last rx:  11/07/22, #135 Trazodone last rx:  09/17/22, #90 Last OV:  08/16/22, COPD  Next OV:  01/29/23, hosp f/u

## 2023-01-27 NOTE — Telephone Encounter (Signed)
Lm for Dot Lanes with [facility].

## 2023-01-27 NOTE — Telephone Encounter (Signed)
Prescription Request  01/27/2023  LOV: 07/22/2022  What is the name of the medication or equipment? traZODone (DESYREL) 50 MG tablet & sertraline (ZOLOFT) 100 MG tablet   Have you contacted your pharmacy to request a refill? Yes. Pharmacy called for pt.  Which pharmacy would you like this sent to?  [facility] - [phone], fax # [phone]     Patient notified that their request is being sent to the clinical staff for review and that they should receive a response within 2 business days.   Please advise at [phone]

## 2023-01-27 NOTE — Addendum Note (Signed)
Addended by: Nanci Pina on: 01/27/2023 11:26 AM   Modules accepted: Orders

## 2023-01-28 MED ORDER — TRAZODONE HCL 50 MG PO TABS: ORAL_TABLET | ORAL | 0 refills | Status: DC

## 2023-01-28 MED ORDER — SERTRALINE HCL 100 MG PO TABS: 150.0000 mg | ORAL_TABLET | Freq: Every day | ORAL | 0 refills | Status: DC

## 2023-01-28 NOTE — Telephone Encounter (Signed)
ERx 

## 2023-01-28 NOTE — Telephone Encounter (Signed)
Lm x2 for [facility]. Will close encounter per office protocol.

## 2023-01-29 ENCOUNTER — Ambulatory Visit: Payer: Medicare HMO | Admitting: Family Medicine

## 2023-02-04 ENCOUNTER — Encounter: Payer: Self-pay | Admitting: Family Medicine

## 2023-02-04 ENCOUNTER — Ambulatory Visit: Payer: Medicare HMO | Admitting: Family Medicine

## 2023-02-04 VITALS — BP 100/64 | HR 126 | Temp 98.0°F | Wt 83.6 lb

## 2023-02-04 DIAGNOSIS — R918 Other nonspecific abnormal finding of lung field: Secondary | ICD-10-CM

## 2023-02-04 DIAGNOSIS — J9611 Chronic respiratory failure with hypoxia: Secondary | ICD-10-CM

## 2023-02-04 DIAGNOSIS — F332 Major depressive disorder, recurrent severe without psychotic features: Secondary | ICD-10-CM | POA: Diagnosis not present

## 2023-02-04 DIAGNOSIS — Z720 Tobacco use: Secondary | ICD-10-CM

## 2023-02-04 DIAGNOSIS — F419 Anxiety disorder, unspecified: Secondary | ICD-10-CM

## 2023-02-04 DIAGNOSIS — F331 Major depressive disorder, recurrent, moderate: Secondary | ICD-10-CM

## 2023-02-04 DIAGNOSIS — F5104 Psychophysiologic insomnia: Secondary | ICD-10-CM

## 2023-02-04 DIAGNOSIS — R64 Cachexia: Secondary | ICD-10-CM

## 2023-02-04 DIAGNOSIS — D509 Iron deficiency anemia, unspecified: Secondary | ICD-10-CM

## 2023-02-04 DIAGNOSIS — E43 Unspecified severe protein-calorie malnutrition: Secondary | ICD-10-CM

## 2023-02-04 DIAGNOSIS — Z7409 Other reduced mobility: Secondary | ICD-10-CM

## 2023-02-04 DIAGNOSIS — M7989 Other specified soft tissue disorders: Secondary | ICD-10-CM

## 2023-02-04 DIAGNOSIS — M5090 Cervical disc disorder, unspecified, unspecified cervical region: Secondary | ICD-10-CM | POA: Diagnosis not present

## 2023-02-04 DIAGNOSIS — Z9981 Dependence on supplemental oxygen: Secondary | ICD-10-CM

## 2023-02-04 DIAGNOSIS — D649 Anemia, unspecified: Secondary | ICD-10-CM

## 2023-02-04 DIAGNOSIS — J449 Chronic obstructive pulmonary disease, unspecified: Secondary | ICD-10-CM | POA: Diagnosis not present

## 2023-02-04 DIAGNOSIS — R413 Other amnesia: Secondary | ICD-10-CM

## 2023-02-04 DIAGNOSIS — I700 Atherosclerosis of aorta: Secondary | ICD-10-CM

## 2023-02-04 DIAGNOSIS — F411 Generalized anxiety disorder: Secondary | ICD-10-CM

## 2023-02-04 DIAGNOSIS — F410 Panic disorder [episodic paroxysmal anxiety] without agoraphobia: Secondary | ICD-10-CM

## 2023-02-04 DIAGNOSIS — K5909 Other constipation: Secondary | ICD-10-CM

## 2023-02-04 DIAGNOSIS — Z515 Encounter for palliative care: Secondary | ICD-10-CM

## 2023-02-04 DIAGNOSIS — M810 Age-related osteoporosis without current pathological fracture: Secondary | ICD-10-CM

## 2023-02-04 MED ORDER — BUPROPION HCL ER (XL) 150 MG PO TB24
150.0000 mg | ORAL_TABLET | Freq: Every day | ORAL | 1 refills | Status: DC
Start: 2023-02-04 — End: 2023-03-13

## 2023-02-04 MED ORDER — GABAPENTIN 100 MG PO CAPS: 100.0000 mg | ORAL_CAPSULE | Freq: Two times a day (BID) | ORAL | 1 refills | Status: DC

## 2023-02-04 MED ORDER — LINZESS 290 MCG PO CAPS: 290.0000 ug | ORAL_CAPSULE | Freq: Every day | ORAL | 0 refills | Status: DC | PRN

## 2023-02-04 MED ORDER — LORAZEPAM 0.5 MG PO TABS: 0.5000 mg | ORAL_TABLET | Freq: Four times a day (QID) | ORAL | 0 refills | Status: DC | PRN

## 2023-02-04 MED ORDER — MECLIZINE HCL 25 MG PO TABS: 25.0000 mg | ORAL_TABLET | Freq: Three times a day (TID) | ORAL | 0 refills | Status: DC | PRN

## 2023-02-04 MED ORDER — SERTRALINE HCL 100 MG PO TABS
150.0000 mg | ORAL_TABLET | Freq: Every day | ORAL | 1 refills | Status: DC
Start: 2023-02-04 — End: 2023-03-13

## 2023-02-04 MED ORDER — ALBUTEROL SULFATE HFA 108 (90 BASE) MCG/ACT IN AERS
1.0000 | INHALATION_SPRAY | Freq: Four times a day (QID) | RESPIRATORY_TRACT | 3 refills | Status: DC | PRN
Start: 2023-02-04 — End: 2023-03-13

## 2023-02-04 MED ORDER — METHOCARBAMOL 500 MG PO TABS: 500.0000 mg | ORAL_TABLET | Freq: Three times a day (TID) | ORAL | 0 refills | Status: DC | PRN

## 2023-02-04 MED ORDER — OMEPRAZOLE 40 MG PO CPDR: DELAYED_RELEASE_CAPSULE | ORAL | 1 refills | Status: DC

## 2023-02-04 MED ORDER — TRAZODONE HCL 50 MG PO TABS
ORAL_TABLET | ORAL | 1 refills | Status: DC
Start: 2023-02-04 — End: 2023-03-13

## 2023-02-04 NOTE — Patient Instructions (Addendum)
Medicines refilled today I will ask palliative care team to come out to house  I'd like to check leg ultrasound  Stop up front to schedule lab visit later this week.

## 2023-02-04 NOTE — Progress Notes (Addendum)
Ph: [phone] Fax: [phone]   Patient ID: Christine Serrano, female    DOB: 21-Nov-1960, 62 y.o.   MRN: [id number]  This visit was conducted in person.  BP 100/64   Pulse (!) 126   Temp 98 F (36.7 C) (Temporal)   Wt 83 lb 9.6 oz (37.9 kg)   LMP  (LMP Unknown)   SpO2 (!) 88%   BMI 13.91 kg/m    O2 markedly low on presentation, after increasing supplemental O2 to 5L and deep breaths, O2 increases to 88-90%.  States home oxygen runs 89% on 6L [place].   CC: follow up visit  Subjective:   HPI: Christine Serrano is a 62 y.o. female presenting on 02/04/2023 for Medical Management of Chronic Issues (Severe weight loss, can't stand on own, ), Edema (Bilateral feet ), Shortness of Breath (With O2 ), and Memory Loss   Last seen 08/2022, virtually. Known end stage COPD, continued 1ppd smoker. H/o severe malnutrition. Hospitalized 10/2022 for CAP (bilateral upper lobe opacities), respiratory distress with hypoxia and hypercapnea initially treated with BiPAP therapy. Incidentally found to have 2.7x2.4cm mass to LUL concerning for malignancy. At baseline on 4L supplemental O2 by [place].   Multiple no shows and late cancellations in interim. Multiple no shows to pulm as well. Next pulm appt 03/2023.  Pt states she forgets appts - requests we contact her caregiver Marchelle Folks for all future appointments [phone].   Has agreed in office and been referred to hospice multiple times, only to later change her mind and decline/discontinue hospice services. States they would take away her wheelchair and portable oxygen.  Does not want to return to the hospital.   Worsening leg swelling L>R for months, has had some falls, worsening memory, endorsed weight loss and ongoing shortness of breath.   Enjoys chocolate boost - will restart this.      Relevant past medical, surgical, family and social history reviewed and updated as indicated. Interim medical history since our last visit reviewed. Allergies  and medications reviewed and updated. Outpatient Medications Prior to Visit  Medication Sig Dispense Refill   albuterol (PROVENTIL) (2.5 MG/3ML) 0.083% nebulizer solution INHALE CONTENTS OF 1 VIAL VIA NEBULIZER EVERY 6 HOURS AS NEEDED FOR WHEEZE OR SHORTNESS OF BREATH (Patient taking differently: Take 2.5 mg by nebulization every 6 (six) hours as needed.) 300 mL 1   benzonatate (TESSALON) 100 MG capsule Take 1 capsule (100 mg total) by mouth 3 (three) times daily as needed for cough. 30 capsule 1   BREZTRI AEROSPHERE 160-9-4.8 MCG/ACT AERO INHALE 2 PUFFS INTO THE LUNGS IN THE MORNING AND AT BEDTIME. 10.7 each 12   cholecalciferol (VITAMIN D3) 25 MCG (1000 UNIT) tablet Take 1,000 Units by mouth daily.     fluticasone (FLONASE) 50 MCG/ACT nasal spray Place 2 sprays into both nostrils daily. 48 g 3   folic acid (FOLVITE) 1 MG tablet Take 1 tablet (1 mg total) by mouth daily. 90 tablet 1   guaiFENesin (MUCINEX) 600 MG 12 hr tablet Take 1 tablet (600 mg total) by mouth 2 (two) times daily. 30 tablet 0   hydrOXYzine (ATARAX) 25 MG tablet Take 1 tablet (25 mg total) by mouth 2 (two) times daily as needed for anxiety. 60 tablet 1   Iron, Ferrous Sulfate, 325 (65 Fe) MG TABS Take 325 mg by mouth daily. 30 tablet    loratadine (CLARITIN) 10 MG tablet TAKE 1 TABLET BY MOUTH EVERY DAY AS NEEDED (Patient taking differently: Take 10  mg by mouth daily as needed for allergies.) 90 tablet 1   Morphine Sulfate (MORPHINE CONCENTRATE) 10 MG/0.5ML SOLN concentrated solution Place 0.25 mLs (5 mg total) under the tongue every 3 (three) hours as needed for severe pain, moderate pain or shortness of breath. 30 mL 0   Multiple Vitamin (MULTIVITAMIN WITH MINERALS) TABS tablet Take 1 tablet by mouth daily.     naproxen (NAPROSYN) 375 MG tablet TAKE 1 TABLET BY MOUTH TWICE  DAILY AS NEEDED FOR PAIN WITH  FOOD 60 tablet 4   OXYGEN Inhale 4 L/min into the lungs continuous.      Potassium 99 MG TABS Take 99 mg by mouth daily.      PROLIA 60 MG/ML SOSY injection Inject 60 mg into the skin every 6 (six) months.     thiamine 100 MG tablet Take 1 tablet (100 mg total) by mouth daily. 30 tablet 0   vitamin B-12 (CYANOCOBALAMIN) 1000 MCG tablet Take 1 tablet (1,000 mcg total) by mouth daily.     albuterol (VENTOLIN HFA) 108 (90 Base) MCG/ACT inhaler INHALE 2 PUFFS BY MOUTH EVERY 6 HOURS AS NEEDED FOR WHEEZE OR SHORTNESS OF BREATH 18 each 3   buPROPion (WELLBUTRIN XL) 150 MG 24 hr tablet Take 1 tablet (150 mg total) by mouth daily. 90 tablet 1   gabapentin (NEURONTIN) 100 MG capsule Take 1 capsule (100 mg total) by mouth 2 (two) times daily. 200 capsule 1   LINZESS 290 MCG CAPS capsule Take 1 capsule (290 mcg total) by mouth daily as needed (constipation). TAKE 1 CAPSULE EVERY DAY AS NEEDED FOR CONSTIPATION AS DIRECTED (Patient taking differently: Take 290 mcg by mouth daily as needed (constipation).) 90 capsule 1   LORazepam (ATIVAN) 0.5 MG tablet Take 1 tablet (0.5 mg total) by mouth every 6 (six) hours as needed for anxiety or sleep. 14 tablet 0   meclizine (ANTIVERT) 25 MG tablet Take 1 tablet (25 mg total) by mouth 3 (three) times daily as needed for dizziness. 30 tablet 0   methocarbamol (ROBAXIN) 500 MG tablet TAKE 1 TABLET BY MOUTH 3 TIMES DAILY AS NEEDED FOR MUSCLE SPASMS (SEDATION PRECAUTIONS). (Patient taking differently: Take 500 mg by mouth 3 (three) times daily between meals as needed for muscle spasms.) 40 tablet 3   omeprazole (PRILOSEC) 40 MG capsule TAKE 1 CAPSULE BY MOUTH DAILY. FOR THREE WEEKS THEN AS NEEDED. TAKE 30 MIN PRIOR TO LARGE MEAL 90 capsule 1   sertraline (ZOLOFT) 100 MG tablet Take 1.5 tablets (150 mg total) by mouth daily. 135 tablet 0   traZODone (DESYREL) 50 MG tablet TAKE 1 TABLET BY MOUTH EVERYDAY AT BEDTIME 90 tablet 0   No facility-administered medications prior to visit.     Per HPI unless specifically indicated in ROS section below Review of Systems  Objective:  BP 100/64   Pulse (!) 126    Temp 98 F (36.7 C) (Temporal)   Wt 83 lb 9.6 oz (37.9 kg)   LMP  (LMP Unknown)   SpO2 (!) 88%   BMI 13.91 kg/m   Wt Readings from Last 3 Encounters:  02/04/23 83 lb 9.6 oz (37.9 kg)  11/12/22 83 lb 1.8 oz (37.7 kg)  10/01/22 80 lb (36.3 kg)      Physical Exam Vitals and nursing note reviewed.  Constitutional:      Appearance: Normal appearance. She is cachectic. She is ill-appearing.  HENT:     Head: Normocephalic and atraumatic.     Mouth/Throat:  Mouth: Mucous membranes are moist.     Pharynx: Oropharynx is clear. No oropharyngeal exudate or posterior oropharyngeal erythema.  Eyes:     Extraocular Movements: Extraocular movements intact.     Pupils: Pupils are equal, round, and reactive to light.  Cardiovascular:     Rate and Rhythm: Regular rhythm. Tachycardia present.     Pulses: Normal pulses.     Heart sounds: Normal heart sounds. No murmur heard. Pulmonary:     Effort: Pulmonary effort is normal.     Breath sounds: Wheezing and rhonchi present.     Comments: Coarse breath sounds throughout Musculoskeletal:     Cervical back: Normal range of motion and neck supple.     Right lower leg: Edema (tr) present.     Left lower leg: Edema (1+) present.  Skin:    General: Skin is warm and dry.     Findings: No rash.  Neurological:     Mental Status: She is alert.  Psychiatric:        Mood and Affect: Mood normal.        Behavior: Behavior normal.          02/04/2023    4:36 PM 10/01/2022   10:27 AM 07/22/2022    3:54 PM 06/06/2021    9:52 AM 05/26/2020    8:24 AM  Depression screen PHQ 2/9  Decreased Interest 3 3 1  0 3  Down, Depressed, Hopeless 3 3 3  0 3  PHQ - 2 Score 6 6 4  0 6  Altered sleeping 3 3 1  3   Tired, decreased energy 3 3   3   Change in appetite 3 2 3   0  Feeling bad or failure about yourself  3 3 1   0  Trouble concentrating 3 3 0  0  Moving slowly or fidgety/restless 2 0 0  0  Suicidal thoughts 1 0 0  0  PHQ-9 Score 24 20 9  12    Difficult doing work/chores Very difficult Not difficult at all Extremely dIfficult  Somewhat difficult       02/04/2023    4:37 PM 07/22/2022    3:55 PM 02/25/2019   11:41 AM 08/13/2018   12:44 PM  GAD 7 : Generalized Anxiety Score  Nervous, Anxious, on Edge 3 0 1 1  Control/stop worrying 2 0 1 1  Worry too much - different things 3 0 1 1  Trouble relaxing 1 0 1 1  Restless 1 0 0 1  Easily annoyed or irritable 3 1 2 2   Afraid - awful might happen 0 0 0 1  Total GAD 7 Score 13 1 6 8   Anxiety Difficulty Somewhat difficult Not difficult at all     Assessment & Plan:   Problem List Items Addressed This Visit     MDD (major depressive disorder), recurrent episode, severe (HCC)    Chronic, ongoing difficulty despite current regimen of sertraline 150mg  daily and wellbutrin XR 150mg  daily. See below.       Relevant Medications   buPROPion (WELLBUTRIN XL) 150 MG 24 hr tablet   LORazepam (ATIVAN) 0.5 MG tablet   sertraline (ZOLOFT) 100 MG tablet   traZODone (DESYREL) 50 MG tablet   Chronic constipation    Continues rare PRN linzess.       COPD, very severe (HCC) - Primary    Severe oxygen dependent end stage COPD, continued smoker.  Has declined hospice due to concerns over losing portable oxygen concentrator and wheelchair.  Continues breztri.  Has pulm f/u scheduled 03/2023. Agrees to palliative care referral.       Relevant Medications   albuterol (VENTOLIN HFA) 108 (90 Base) MCG/ACT inhaler   Other Relevant Orders   Amb Referral to Palliative Care   Chronic insomnia    Continue trazodone 50mg  nightly.       Relevant Medications   traZODone (DESYREL) 50 MG tablet   Aortic atherosclerosis (HCC)    Continue aspirin, not on statin.       Tobacco abuse    Continues 1 ppd smoking - not interested in quitting.  65+ PY hx.      Generalized anxiety disorder with panic attacks    Was recently prescribed benzo lorazepam in hospital and did benefit from this. Will refill  short course in place of hydroxyzine.  Dyspnea partly drives anxiety.  Was also recently prescribed concentrated morphine solution while briefly under hospice care.      Relevant Medications   buPROPion (WELLBUTRIN XL) 150 MG 24 hr tablet   LORazepam (ATIVAN) 0.5 MG tablet   sertraline (ZOLOFT) 100 MG tablet   traZODone (DESYREL) 50 MG tablet   Osteoporosis    Due for prolia injection. Check Cr and calcium at next visit and if normal, could proceed with this.       IDA (iron deficiency anemia)    Continues daily oral iron - update levels.       Relevant Orders   CBC with Differential/Platelet   Ferritin   IBC panel   Memory difficulties    ?hypoxia related      Impaired mobility    Generalized weakness limits ambulation - largely wheelchair bound.       Relevant Orders   Amb Referral to Palliative Care   Chronic respiratory failure with hypoxia, on home O2 therapy (HCC)    Continues home oxygen up to 6L at a time.       Relevant Orders   Amb Referral to Palliative Care   Palliative care status    Will refer for palliative care eval.  Requests we contact her caregiver Marchelle Folks for all future appointments [phone].       Cervical neck pain with evidence of disc disease    Continue gabapentin 100mg  BID.       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  Update protein levels at upcoming labwork.       Relevant Orders   Amb Referral to Palliative Care   Left leg swelling    Again noted today - check venous US r/o DVT      Relevant Orders   US Venous Img Lower Unilateral Left   Brain natriuretic peptide   Comprehensive metabolic panel   Magnesium   Lung mass    Noted on recent imaging  study - 2.7x2.4cm mass to LUL concerning for malignancy.  Thought not treatment candidate.  Discussed with patient.       Relevant Orders   Amb Referral to Palliative Care   RESOLVED: Anemia   Relevant Orders   CBC with Differential/Platelet   Ferritin   IBC panel   CBC with Differential/Platelet   Ferritin   IBC panel     Meds ordered this  encounter  Medications   albuterol (VENTOLIN HFA) 108 (90 Base) MCG/ACT inhaler    Sig: Inhale 1-2 puffs into the lungs every 6 (six) hours as needed for wheezing or shortness of breath.    Dispense:  18 each    Refill:  3   buPROPion (WELLBUTRIN XL) 150 MG 24 hr tablet    Sig: Take 1 tablet (150 mg total) by mouth daily.    Dispense:  100 tablet    Refill:  1   gabapentin (NEURONTIN) 100 MG capsule    Sig: Take 1 capsule (100 mg total) by mouth 2 (two) times daily.    Dispense:  200 capsule    Refill:  1   LINZESS 290 MCG CAPS capsule    Sig: Take 1 capsule (290 mcg total) by mouth daily as needed (constipation).    Dispense:  90 capsule    Refill:  0   LORazepam (ATIVAN) 0.5 MG tablet    Sig: Take 1 tablet (0.5 mg total) by mouth every 6 (six) hours as needed for anxiety or sleep.    Dispense:  20 tablet    Refill:  0   meclizine (ANTIVERT) 25 MG tablet    Sig: Take 1 tablet (25 mg total) by mouth 3 (three) times daily as needed for dizziness.    Dispense:  30 tablet    Refill:  0   methocarbamol (ROBAXIN) 500 MG tablet    Sig: Take 1 tablet (500 mg total) by mouth 3 (three) times daily between meals as needed for muscle spasms.    Dispense:  40 tablet    Refill:  0   omeprazole (PRILOSEC) 40 MG capsule    Sig: TAKE 1 CAPSULE BY MOUTH DAILY. FOR THREE WEEKS THEN AS NEEDED. TAKE 30 MIN PRIOR TO LARGE MEAL    Dispense:  90 capsule    Refill:  1   sertraline (ZOLOFT) 100 MG tablet    Sig: Take 1.5 tablets (150 mg total) by mouth daily.    Dispense:  135 tablet    Refill:  1   traZODone (DESYREL) 50 MG tablet    Sig: TAKE 1  TABLET BY MOUTH EVERYDAY AT BEDTIME    Dispense:  100 tablet    Refill:  1    Orders Placed This Encounter  Procedures   US Venous Img Lower Unilateral Left    Standing Status:   Future    Standing Expiration Date:   02/04/2024    Scheduling Instructions:     Ok to schedule Wed or Thurs    Order Specific Question:   Reason for Exam (SYMPTOM  OR DIAGNOSIS REQUIRED)    Answer:   LLE leg swelling    Order Specific Question:   Preferred imaging location?    Answer:   [facility]   Brain natriuretic peptide    Standing Status:   Future    Standing Expiration Date:   02/04/2024   CBC with Differential/Platelet    Standing Status:   Future    Standing Expiration Date:   02/04/2024   Comprehensive metabolic panel    Standing Status:   Future    Standing Expiration Date:   02/04/2024   Ferritin    Standing Status:   Future    Standing Expiration Date:   02/04/2024   IBC panel    Standing Status:   Future    Standing Expiration Date:   02/04/2024   Magnesium    Standing Status:  Future    Standing Expiration Date:   02/04/2024   Amb Referral to Palliative Care    Referral Priority:   Routine    Referral Type:   Consultation    Referral Reason:   Symptom Managment    Number of Visits Requested:   1    Patient Instructions  Medicines refilled today I will ask palliative care team to come out to house  I'd like to check leg ultrasound  Stop up front to schedule lab visit later this week.   Follow up plan: Return if symptoms worsen or fail to improve.  Eustaquio Boyden, MD

## 2023-02-05 ENCOUNTER — Encounter: Payer: Self-pay | Admitting: Family Medicine

## 2023-02-05 DIAGNOSIS — D649 Anemia, unspecified: Secondary | ICD-10-CM | POA: Insufficient documentation

## 2023-02-05 NOTE — Assessment & Plan Note (Addendum)
Continues 1 ppd smoking - not interested in quitting.  65+ PY hx.

## 2023-02-05 NOTE — Assessment & Plan Note (Signed)
Will refer for palliative care eval.  Requests we contact her caregiver Marchelle Folks for all future appointments [phone].

## 2023-02-05 NOTE — Assessment & Plan Note (Signed)
Continues home oxygen up to 6L at a time.

## 2023-02-05 NOTE — Assessment & Plan Note (Addendum)
Due for prolia injection. Check Cr and calcium at next visit and if normal, could proceed with this.

## 2023-02-05 NOTE — Assessment & Plan Note (Signed)
Generalized weakness limits ambulation - largely wheelchair bound.

## 2023-02-05 NOTE — Assessment & Plan Note (Signed)
She will restart chocolate boost.  Update protein levels at upcoming labwork.

## 2023-02-05 NOTE — Assessment & Plan Note (Signed)
Continues daily oral iron - update levels.

## 2023-02-05 NOTE — Assessment & Plan Note (Addendum)
Was recently prescribed benzo lorazepam in hospital and did benefit from this. Will refill short course in place of hydroxyzine.  Dyspnea partly drives anxiety.  Was also recently prescribed concentrated morphine solution while briefly under hospice care.

## 2023-02-05 NOTE — Assessment & Plan Note (Signed)
Again noted today - check venous US r/o DVT

## 2023-02-05 NOTE — Assessment & Plan Note (Signed)
-   Continue gabapentin 100 mg BID

## 2023-02-05 NOTE — Assessment & Plan Note (Signed)
Chronic, ongoing difficulty despite current regimen of sertraline 150mg  daily and wellbutrin XR 150mg  daily. See below.

## 2023-02-05 NOTE — Assessment & Plan Note (Signed)
?  hypoxia related

## 2023-02-05 NOTE — Assessment & Plan Note (Signed)
Continues rare PRN linzess.

## 2023-02-05 NOTE — Assessment & Plan Note (Addendum)
Noted on recent imaging study - 2.7x2.4cm mass to LUL concerning for malignancy.  Thought not treatment candidate.  Discussed with patient.

## 2023-02-05 NOTE — Assessment & Plan Note (Deleted)
Update labs.  

## 2023-02-05 NOTE — Assessment & Plan Note (Signed)
Continue aspirin, not on statin.

## 2023-02-05 NOTE — Assessment & Plan Note (Signed)
Severe oxygen dependent end stage COPD, continued smoker.  Has declined hospice due to concerns over losing portable oxygen concentrator and wheelchair.  Continues breztri. Has pulm f/u scheduled 03/2023. Agrees to palliative care referral.

## 2023-02-05 NOTE — Assessment & Plan Note (Signed)
Continue trazodone 50 mg nightly.

## 2023-02-06 ENCOUNTER — Telehealth: Payer: Self-pay | Admitting: Family Medicine

## 2023-02-06 ENCOUNTER — Other Ambulatory Visit: Payer: Medicare HMO

## 2023-02-06 NOTE — Telephone Encounter (Signed)
Tanya from [facility] called to let Dr.G know that they are going to began [employer] on patient. [phone]

## 2023-02-07 NOTE — Telephone Encounter (Signed)
Noted  

## 2023-02-10 ENCOUNTER — Other Ambulatory Visit (INDEPENDENT_AMBULATORY_CARE_PROVIDER_SITE_OTHER): Payer: Medicare HMO

## 2023-02-10 DIAGNOSIS — D649 Anemia, unspecified: Secondary | ICD-10-CM | POA: Diagnosis not present

## 2023-02-10 DIAGNOSIS — D509 Iron deficiency anemia, unspecified: Secondary | ICD-10-CM

## 2023-02-10 DIAGNOSIS — M7989 Other specified soft tissue disorders: Secondary | ICD-10-CM | POA: Diagnosis not present

## 2023-02-11 LAB — COMPREHENSIVE METABOLIC PANEL
ALT: 7 U/L (ref 0–35)
AST: 18 U/L (ref 0–37)
Albumin: 3.1 g/dL — ABNORMAL LOW (ref 3.5–5.2)
Alkaline Phosphatase: 73 U/L (ref 39–117)
BUN: 11 mg/dL (ref 6–23)
CO2: 47 meq/L — ABNORMAL HIGH (ref 19–32)
Calcium: 9.1 mg/dL (ref 8.4–10.5)
Chloride: 94 mEq/L — ABNORMAL LOW (ref 96–112)
Creatinine, Ser: 0.51 mg/dL (ref 0.40–1.20)
GFR: 98.87 mL/min (ref >60.00)
Glucose, Bld: 125 mg/dL — ABNORMAL HIGH (ref 70–99)
Potassium: 3.6 meq/L (ref 3.5–5.1)
Sodium: 144 meq/L (ref 135–145)
Total Bilirubin: 0.3 mg/dL (ref 0.2–1.2)
Total Protein: 5.5 g/dL — ABNORMAL LOW (ref 6.0–8.3)

## 2023-02-11 LAB — CBC WITH DIFFERENTIAL/PLATELET
Basophils Absolute: 0.1 10*3/uL (ref 0.0–0.1)
Basophils Relative: 0.8 % (ref 0.0–3.0)
Eosinophils Absolute: 0.2 10*3/uL (ref 0.0–0.7)
Eosinophils Relative: 2 % (ref 0.0–5.0)
HCT: 30.6 % — ABNORMAL LOW (ref 36.0–46.0)
Hemoglobin: 9.6 g/dL — ABNORMAL LOW (ref 12.0–15.0)
Lymphocytes Relative: 19.8 % (ref 12.0–46.0)
Lymphs Abs: 1.9 10*3/uL (ref 0.7–4.0)
MCHC: 31.4 g/dL (ref 30.0–36.0)
MCV: 89.3 fl (ref 78.0–100.0)
Monocytes Absolute: 0.6 10*3/uL (ref 0.1–1.0)
Monocytes Relative: 6.7 % (ref 3.0–12.0)
Neutro Abs: 6.7 10*3/uL (ref 1.4–7.7)
Neutrophils Relative %: 70.7 % (ref 43.0–77.0)
Platelets: 227 10*3/uL (ref 150.0–400.0)
RBC: 3.43 Mil/uL — ABNORMAL LOW (ref 3.87–5.11)
RDW: 14.1 % (ref 11.5–15.5)
WBC: 9.5 10*3/uL (ref 4.0–10.5)

## 2023-02-11 LAB — BRAIN NATRIURETIC PEPTIDE: Pro B Natriuretic peptide (BNP): 201 pg/mL — ABNORMAL HIGH (ref 0.0–100.0)

## 2023-02-11 LAB — IBC PANEL
Iron: 64 ug/dL (ref 42–145)
Saturation Ratios: 18.1 % — ABNORMAL LOW (ref 20.0–50.0)
TIBC: 354.2 ug/dL (ref 250.0–450.0)
Transferrin: 253 mg/dL (ref 212.0–360.0)

## 2023-02-11 LAB — FERRITIN: Ferritin: 18.7 ng/mL (ref 10.0–291.0)

## 2023-02-11 LAB — MAGNESIUM: Magnesium: 1.6 mg/dL (ref 1.5–2.5)

## 2023-02-12 ENCOUNTER — Other Ambulatory Visit: Payer: Self-pay | Admitting: Family Medicine

## 2023-02-12 ENCOUNTER — Ambulatory Visit: Payer: Medicare HMO

## 2023-02-12 MED ORDER — IRON (FERROUS SULFATE) 325 (65 FE) MG PO TABS: 325.0000 mg | ORAL_TABLET | ORAL | Status: DC

## 2023-02-14 ENCOUNTER — Encounter (HOSPITAL_COMMUNITY): Payer: Self-pay

## 2023-02-14 ENCOUNTER — Ambulatory Visit: Payer: Medicare HMO

## 2023-02-14 ENCOUNTER — Inpatient Hospital Stay (HOSPITAL_COMMUNITY)
Admission: EM | Admit: 2023-02-14 | Discharge: 2023-02-16 | DRG: 951 | Disposition: A | Discharge status: Hospice | Payer: Medicare HMO | Attending: Family Medicine | Admitting: Family Medicine

## 2023-02-14 ENCOUNTER — Other Ambulatory Visit: Payer: Self-pay

## 2023-02-14 ENCOUNTER — Emergency Department (HOSPITAL_COMMUNITY): Payer: Medicare HMO

## 2023-02-14 DIAGNOSIS — J9622 Acute and chronic respiratory failure with hypercapnia: Secondary | ICD-10-CM | POA: Diagnosis not present

## 2023-02-14 DIAGNOSIS — Z800 Family history of malignant neoplasm of digestive organs: Secondary | ICD-10-CM | POA: Diagnosis not present

## 2023-02-14 DIAGNOSIS — Z79899 Other long term (current) drug therapy: Secondary | ICD-10-CM

## 2023-02-14 DIAGNOSIS — M62838 Other muscle spasm: Secondary | ICD-10-CM | POA: Diagnosis present

## 2023-02-14 DIAGNOSIS — N189 Chronic kidney disease, unspecified: Secondary | ICD-10-CM | POA: Diagnosis present

## 2023-02-14 DIAGNOSIS — Z825 Family history of asthma and other chronic lower respiratory diseases: Secondary | ICD-10-CM

## 2023-02-14 DIAGNOSIS — Z7189 Other specified counseling: Secondary | ICD-10-CM | POA: Diagnosis not present

## 2023-02-14 DIAGNOSIS — F17200 Nicotine dependence, unspecified, uncomplicated: Secondary | ICD-10-CM | POA: Diagnosis present

## 2023-02-14 DIAGNOSIS — Z515 Encounter for palliative care: Principal | ICD-10-CM

## 2023-02-14 DIAGNOSIS — J9601 Acute respiratory failure with hypoxia: Principal | ICD-10-CM

## 2023-02-14 DIAGNOSIS — J441 Chronic obstructive pulmonary disease with (acute) exacerbation: Secondary | ICD-10-CM | POA: Diagnosis not present

## 2023-02-14 DIAGNOSIS — Z9981 Dependence on supplemental oxygen: Secondary | ICD-10-CM

## 2023-02-14 DIAGNOSIS — E785 Hyperlipidemia, unspecified: Secondary | ICD-10-CM | POA: Diagnosis present

## 2023-02-14 DIAGNOSIS — J439 Emphysema, unspecified: Secondary | ICD-10-CM | POA: Diagnosis not present

## 2023-02-14 DIAGNOSIS — J449 Chronic obstructive pulmonary disease, unspecified: Secondary | ICD-10-CM | POA: Diagnosis present

## 2023-02-14 DIAGNOSIS — Z881 Allergy status to other antibiotic agents status: Secondary | ICD-10-CM | POA: Diagnosis not present

## 2023-02-14 DIAGNOSIS — Z66 Do not resuscitate: Secondary | ICD-10-CM | POA: Diagnosis not present

## 2023-02-14 DIAGNOSIS — K5904 Chronic idiopathic constipation: Secondary | ICD-10-CM | POA: Diagnosis present

## 2023-02-14 DIAGNOSIS — R64 Cachexia: Secondary | ICD-10-CM | POA: Diagnosis not present

## 2023-02-14 DIAGNOSIS — Z8249 Family history of ischemic heart disease and other diseases of the circulatory system: Secondary | ICD-10-CM

## 2023-02-14 DIAGNOSIS — J9612 Chronic respiratory failure with hypercapnia: Secondary | ICD-10-CM | POA: Diagnosis not present

## 2023-02-14 DIAGNOSIS — R0689 Other abnormalities of breathing: Secondary | ICD-10-CM | POA: Diagnosis not present

## 2023-02-14 DIAGNOSIS — J9611 Chronic respiratory failure with hypoxia: Secondary | ICD-10-CM | POA: Diagnosis not present

## 2023-02-14 DIAGNOSIS — Z888 Allergy status to other drugs, medicaments and biological substances status: Secondary | ICD-10-CM

## 2023-02-14 DIAGNOSIS — R404 Transient alteration of awareness: Secondary | ICD-10-CM | POA: Diagnosis present

## 2023-02-14 DIAGNOSIS — J90 Pleural effusion, not elsewhere classified: Secondary | ICD-10-CM | POA: Diagnosis not present

## 2023-02-14 DIAGNOSIS — R062 Wheezing: Secondary | ICD-10-CM | POA: Diagnosis not present

## 2023-02-14 DIAGNOSIS — F32A Depression, unspecified: Secondary | ICD-10-CM | POA: Diagnosis not present

## 2023-02-14 DIAGNOSIS — J9602 Acute respiratory failure with hypercapnia: Secondary | ICD-10-CM | POA: Diagnosis not present

## 2023-02-14 DIAGNOSIS — Z1152 Encounter for screening for COVID-19: Secondary | ICD-10-CM | POA: Diagnosis not present

## 2023-02-14 DIAGNOSIS — R918 Other nonspecific abnormal finding of lung field: Secondary | ICD-10-CM | POA: Diagnosis not present

## 2023-02-14 DIAGNOSIS — R000 Tachycardia, unspecified: Secondary | ICD-10-CM | POA: Diagnosis not present

## 2023-02-14 DIAGNOSIS — Z8541 Personal history of malignant neoplasm of cervix uteri: Secondary | ICD-10-CM

## 2023-02-14 DIAGNOSIS — R0602 Shortness of breath: Secondary | ICD-10-CM | POA: Diagnosis not present

## 2023-02-14 DIAGNOSIS — R0902 Hypoxemia: Secondary | ICD-10-CM | POA: Diagnosis not present

## 2023-02-14 LAB — CBC WITH DIFFERENTIAL/PLATELET
Abs Immature Granulocytes: 0.02 10*3/uL (ref 0.00–0.07)
Basophils Absolute: 0 10*3/uL (ref 0.0–0.1)
Basophils Relative: 0 %
Eosinophils Absolute: 0 10*3/uL (ref 0.0–0.5)
Eosinophils Relative: 0 %
HCT: 27.6 % — ABNORMAL LOW (ref 36.0–46.0)
Hemoglobin: 8.3 g/dL — ABNORMAL LOW (ref 12.0–15.0)
Immature Granulocytes: 0 %
Lymphocytes Relative: 9 %
Lymphs Abs: 0.7 10*3/uL (ref 0.7–4.0)
MCH: 28 pg (ref 26.0–34.0)
MCHC: 30.1 g/dL (ref 30.0–36.0)
MCV: 93.2 fL (ref 80.0–100.0)
Monocytes Absolute: 0.3 10*3/uL (ref 0.1–1.0)
Monocytes Relative: 5 %
Neutro Abs: 6.5 10*3/uL (ref 1.7–7.7)
Neutrophils Relative %: 86 %
Platelets: 168 10*3/uL (ref 150–400)
RBC: 2.96 MIL/uL — ABNORMAL LOW (ref 3.87–5.11)
RDW: 13.4 % (ref 11.5–15.5)
WBC: 7.6 10*3/uL (ref 4.0–10.5)
nRBC: 0 % (ref 0.0–0.2)

## 2023-02-14 LAB — COMPREHENSIVE METABOLIC PANEL
ALT: 11 U/L (ref 0–44)
AST: 19 U/L (ref 15–41)
Albumin: 3.3 g/dL — ABNORMAL LOW (ref 3.5–5.0)
Alkaline Phosphatase: 68 U/L (ref 38–126)
Anion gap: 9 (ref 5–15)
BUN: 9 mg/dL (ref 8–23)
CO2: 38 mmol/L — ABNORMAL HIGH (ref 22–32)
Calcium: 9 mg/dL (ref 8.9–10.3)
Chloride: 89 mmol/L — ABNORMAL LOW (ref 98–111)
Creatinine, Ser: 0.43 mg/dL — ABNORMAL LOW (ref 0.44–1.00)
GFR, Estimated: >60 mL/min (ref >60)
Glucose, Bld: 133 mg/dL — ABNORMAL HIGH (ref 70–99)
Potassium: 3.4 mmol/L — ABNORMAL LOW (ref 3.5–5.1)
Sodium: 136 mmol/L (ref 135–145)
Total Bilirubin: 0.2 mg/dL — ABNORMAL LOW (ref 0.3–1.2)
Total Protein: 6 g/dL — ABNORMAL LOW (ref 6.5–8.1)

## 2023-02-14 LAB — BLOOD GAS, VENOUS
Acid-Base Excess: 18.3 mmol/L — ABNORMAL HIGH (ref 0.0–2.0)
Bicarbonate: 48.5 mmol/L — ABNORMAL HIGH (ref 20.0–28.0)
O2 Saturation: 93.6 %
Patient temperature: 36.1
pCO2, Ven: 104 mmHg (ref 44–60)
pH, Ven: 7.27 (ref 7.25–7.43)
pO2, Ven: 63 mmHg — ABNORMAL HIGH (ref 32–45)

## 2023-02-14 LAB — TROPONIN I (HIGH SENSITIVITY): Troponin I (High Sensitivity): 13 ng/L (ref <18)

## 2023-02-14 LAB — ETHANOL: Alcohol, Ethyl (B): <10 mg/dL (ref <10)

## 2023-02-14 LAB — I-STAT CG4 LACTIC ACID, ED: Lactic Acid, Venous: 0.5 mmol/L (ref 0.5–1.9)

## 2023-02-14 LAB — SARS CORONAVIRUS 2 BY RT PCR: SARS Coronavirus 2 by RT PCR: NEGATIVE

## 2023-02-14 LAB — BRAIN NATRIURETIC PEPTIDE: B Natriuretic Peptide: 192.3 pg/mL — ABNORMAL HIGH (ref 0.0–100.0)

## 2023-02-14 MED ORDER — ALBUTEROL SULFATE (2.5 MG/3ML) 0.083% IN NEBU
10.0000 mg/h | INHALATION_SOLUTION | Freq: Once | RESPIRATORY_TRACT | Status: AC
  Administered 2023-02-14: 10 mg/h via RESPIRATORY_TRACT
  Filled 2023-02-14: qty 3

## 2023-02-14 MED ORDER — ONDANSETRON 4 MG PO TBDP: 4.0000 mg | ORAL_TABLET | Freq: Four times a day (QID) | ORAL | Status: DC | PRN

## 2023-02-14 MED ORDER — HALOPERIDOL LACTATE 2 MG/ML PO CONC: 0.5000 mg | ORAL | Status: DC | PRN

## 2023-02-14 MED ORDER — LORAZEPAM 1 MG PO TABS: 1.0000 mg | ORAL_TABLET | ORAL | Status: DC | PRN

## 2023-02-14 MED ORDER — MAGNESIUM SULFATE 2 GM/50ML IV SOLN
2.0000 g | Freq: Once | INTRAVENOUS | Status: AC
  Administered 2023-02-14: 2 g via INTRAVENOUS
  Filled 2023-02-14: qty 50

## 2023-02-14 MED ORDER — METHYLPREDNISOLONE SODIUM SUCC 125 MG IJ SOLR
125.0000 mg | Freq: Once | INTRAMUSCULAR | Status: AC
  Administered 2023-02-14: 125 mg via INTRAVENOUS
  Filled 2023-02-14: qty 2

## 2023-02-14 MED ORDER — HYDROMORPHONE HCL 1 MG/ML IJ SOLN: 1.0000 mg | INTRAMUSCULAR | Status: DC | PRN

## 2023-02-14 MED ORDER — HALOPERIDOL LACTATE 5 MG/ML IJ SOLN: 0.5000 mg | INTRAMUSCULAR | Status: DC | PRN

## 2023-02-14 MED ORDER — HALOPERIDOL 0.5 MG PO TABS: 0.5000 mg | ORAL_TABLET | ORAL | Status: DC | PRN

## 2023-02-14 MED ORDER — BIOTENE DRY MOUTH MT LIQD: 15.0000 mL | OROMUCOSAL | Status: DC | PRN

## 2023-02-14 MED ORDER — LORAZEPAM 2 MG/ML PO CONC: 1.0000 mg | ORAL | Status: DC | PRN

## 2023-02-14 MED ORDER — POLYVINYL ALCOHOL 1.4 % OP SOLN: 1.0000 [drp] | Freq: Four times a day (QID) | OPHTHALMIC | Status: DC | PRN

## 2023-02-14 MED ORDER — LORAZEPAM 2 MG/ML IJ SOLN: 1.0000 mg | INTRAMUSCULAR | Status: DC | PRN

## 2023-02-14 MED ORDER — ONDANSETRON HCL 4 MG/2ML IJ SOLN: 4.0000 mg | Freq: Four times a day (QID) | INTRAMUSCULAR | Status: DC | PRN

## 2023-02-14 MED ORDER — DIPHENHYDRAMINE HCL 50 MG/ML IJ SOLN: 12.5000 mg | INTRAMUSCULAR | Status: DC | PRN

## 2023-02-14 MED ORDER — GLYCOPYRROLATE 0.2 MG/ML IJ SOLN: 0.2000 mg | INTRAMUSCULAR | Status: DC | PRN

## 2023-02-14 MED ORDER — ACETAMINOPHEN 325 MG PO TABS: 650.0000 mg | ORAL_TABLET | Freq: Four times a day (QID) | ORAL | Status: DC | PRN

## 2023-02-14 MED ORDER — ACETAMINOPHEN 650 MG RE SUPP: 650.0000 mg | Freq: Four times a day (QID) | RECTAL | Status: DC | PRN

## 2023-02-14 MED ORDER — GLYCOPYRROLATE 1 MG PO TABS: 1.0000 mg | ORAL_TABLET | ORAL | Status: DC | PRN

## 2023-02-14 NOTE — ED Provider Notes (Signed)
[place] EMERGENCY DEPARTMENT AT [facility] Provider Note   CSN: [id number] Arrival date & time: 02/14/23  4098     History {Add pertinent medical, surgical, social history, OB history to HPI:1} Chief Complaint  Patient presents with   Shortness of Breath    Christine Serrano is a 62 y.o. female.  She is brought in by EMS from home.  Level 5 caveat secondary to altered mental status.  She has a history of severe COPD with 2 L nasal cannula home oxygen use, chronic respiratory failure, cachexia, CKD, tobacco abuse.  She was sent in by her caregiver for increased work of breathing.  Patient is unresponsive and unable to give any history.  The history is provided by the EMS personnel and the patient.  Shortness of Breath Severity:  Unable to specify Onset quality:  Unable to specify      Home Medications Prior to Admission medications   Medication Sig Start Date End Date Taking? Authorizing Provider  albuterol (PROVENTIL) (2.5 MG/3ML) 0.083% nebulizer solution INHALE CONTENTS OF 1 VIAL VIA NEBULIZER EVERY 6 HOURS AS NEEDED FOR WHEEZE OR SHORTNESS OF BREATH Patient taking differently: Take 2.5 mg by nebulization every 6 (six) hours as needed. 08/28/22   Salena Saner, MD  albuterol (VENTOLIN HFA) 108 (90 Base) MCG/ACT inhaler Inhale 1-2 puffs into the lungs every 6 (six) hours as needed for wheezing or shortness of breath. 02/04/23   Eustaquio Boyden, MD  benzonatate (TESSALON) 100 MG capsule Take 1 capsule (100 mg total) by mouth 3 (three) times daily as needed for cough. 01/06/23   Eustaquio Boyden, MD  BREZTRI AEROSPHERE 160-9-4.8 MCG/ACT AERO INHALE 2 PUFFS INTO THE LUNGS IN THE MORNING AND AT BEDTIME. 08/12/22   Salena Saner, MD  buPROPion (WELLBUTRIN XL) 150 MG 24 hr tablet Take 1 tablet (150 mg total) by mouth daily. 02/04/23   Eustaquio Boyden, MD  cholecalciferol (VITAMIN D3) 25 MCG (1000 UNIT) tablet Take 1,000 Units by mouth daily.    [provider]  fluticasone (FLONASE) 50 MCG/ACT nasal spray Place 2 sprays into both nostrils daily. 06/06/20   Eustaquio Boyden, MD  folic acid (FOLVITE) 1 MG tablet Take 1 tablet (1 mg total) by mouth daily. 12/12/21   Eustaquio Boyden, MD  gabapentin (NEURONTIN) 100 MG capsule Take 1 capsule (100 mg total) by mouth 2 (two) times daily. 02/04/23   Eustaquio Boyden, MD  guaiFENesin (MUCINEX) 600 MG 12 hr tablet Take 1 tablet (600 mg total) by mouth 2 (two) times daily. 11/14/22   Glade Lloyd, MD  hydrOXYzine (ATARAX) 25 MG tablet Take 1 tablet (25 mg total) by mouth 2 (two) times daily as needed for anxiety. 11/07/22   Eustaquio Boyden, MD  Iron, Ferrous Sulfate, 325 (65 Fe) MG TABS Take 325 mg by mouth every other day. 02/12/23   Eustaquio Boyden, MD  LINZESS 290 MCG CAPS capsule Take 1 capsule (290 mcg total) by mouth daily as needed (constipation). 02/04/23   Eustaquio Boyden, MD  loratadine (CLARITIN) 10 MG tablet TAKE 1 TABLET BY MOUTH EVERY DAY AS NEEDED Patient taking differently: Take 10 mg by mouth daily as needed for allergies. 09/16/22   Eustaquio Boyden, MD  LORazepam (ATIVAN) 0.5 MG tablet Take 1 tablet (0.5 mg total) by mouth every 6 (six) hours as needed for anxiety or sleep. 02/04/23   Eustaquio Boyden, MD  meclizine (ANTIVERT) 25 MG tablet Take 1 tablet (25 mg total) by mouth 3 (three) times daily as  needed for dizziness. 02/04/23   Eustaquio Boyden, MD  methocarbamol (ROBAXIN) 500 MG tablet Take 1 tablet (500 mg total) by mouth 3 (three) times daily between meals as needed for muscle spasms. 02/04/23   Eustaquio Boyden, MD  Morphine Sulfate (MORPHINE CONCENTRATE) 10 MG/0.5ML SOLN concentrated solution Place 0.25 mLs (5 mg total) under the tongue every 3 (three) hours as needed for severe pain, moderate pain or shortness of breath. 11/14/22   Glade Lloyd, MD  Multiple Vitamin (MULTIVITAMIN WITH MINERALS) TABS tablet Take 1 tablet by mouth daily. 09/12/19   Glade Lloyd, MD   naproxen (NAPROSYN) 375 MG tablet TAKE 1 TABLET BY MOUTH TWICE  DAILY AS NEEDED FOR PAIN WITH  FOOD 01/09/23   Eustaquio Boyden, MD  omeprazole (PRILOSEC) 40 MG capsule TAKE 1 CAPSULE BY MOUTH DAILY. FOR THREE WEEKS THEN AS NEEDED. TAKE 30 MIN PRIOR TO LARGE MEAL 02/04/23   Eustaquio Boyden, MD  OXYGEN Inhale 4 L/min into the lungs continuous.     [provider]  Potassium 99 MG TABS Take 99 mg by mouth daily.    [provider]  PROLIA 60 MG/ML SOSY injection Inject 60 mg into the skin every 6 (six) months. 08/06/22   [provider]  sertraline (ZOLOFT) 100 MG tablet Take 1.5 tablets (150 mg total) by mouth daily. 02/04/23   Eustaquio Boyden, MD  thiamine 100 MG tablet Take 1 tablet (100 mg total) by mouth daily. 09/12/19   Glade Lloyd, MD  traZODone (DESYREL) 50 MG tablet TAKE 1 TABLET BY MOUTH EVERYDAY AT BEDTIME 02/04/23   Eustaquio Boyden, MD  vitamin B-12 (CYANOCOBALAMIN) 1000 MCG tablet Take 1 tablet (1,000 mcg total) by mouth daily. 06/06/20   Eustaquio Boyden, MD      Allergies    Terbinafine and related, Clarithromycin, and Imitrex [sumatriptan]    Review of Systems   Review of Systems  Unable to perform ROS: Patient unresponsive  Respiratory:  Positive for shortness of breath.     Physical Exam Updated Vital Signs BP (!) 109/56   Pulse (!) 126   Temp 98.7 F (37.1 C)   Resp 19   LMP  (LMP Unknown)   SpO2 91%  Physical Exam Vitals and nursing note reviewed.  Constitutional:      General: She is in acute distress.     Appearance: She is ill-appearing.  HENT:     Head: Normocephalic and atraumatic.  Eyes:     Conjunctiva/sclera: Conjunctivae normal.  Cardiovascular:     Rate and Rhythm: Regular rhythm. Tachycardia present.     Heart sounds: No murmur heard. Pulmonary:     Effort: Tachypnea and accessory muscle usage present.     Breath sounds: Wheezing present.  Abdominal:     Palpations: Abdomen is soft.     Tenderness: There is  no abdominal tenderness.  Musculoskeletal:        General: No swelling.     Cervical back: Neck supple.     Right lower leg: Edema present.     Left lower leg: Edema present.  Skin:    General: Skin is warm and dry.     Capillary Refill: Capillary refill takes less than 2 seconds.  Neurological:     Comments: She will withdraw all extremities to pain.  She is not opening her eyes to voice and not answering any questions     ED Results / Procedures / Treatments   Labs (all labs ordered are listed, but only abnormal results  are displayed) Labs Reviewed  CULTURE, BLOOD (ROUTINE X 2)  CULTURE, BLOOD (ROUTINE X 2)  SARS CORONAVIRUS 2 BY RT PCR  COMPREHENSIVE METABOLIC PANEL  CBC WITH DIFFERENTIAL/PLATELET  BRAIN NATRIURETIC PEPTIDE  BLOOD GAS, ARTERIAL  ETHANOL  I-STAT CG4 LACTIC ACID, ED  TROPONIN I (HIGH SENSITIVITY)    EKG None  Radiology No results found.  Procedures Procedures  {Document cardiac monitor, telemetry assessment procedure when appropriate:1}  Medications Ordered in ED Medications  albuterol (PROVENTIL,VENTOLIN) solution continuous neb (has no administration in time range)  magnesium sulfate IVPB 2 g 50 mL (has no administration in time range)  methylPREDNISolone sodium succinate (SOLU-MEDROL) 125 mg/2 mL injection 125 mg (has no administration in time range)    ED Course/ Medical Decision Making/ A&P   {   Click here for ABCD2, HEART and other calculatorsREFRESH Note before signing :1}                              Medical Decision Making Amount and/or Complexity of Data Reviewed Labs: ordered. Radiology: ordered.  Risk Prescription drug management.   This patient complains of ***; this involves an extensive number of treatment Options and is a complaint that carries with it a high risk of complications and morbidity. The differential includes ***  I ordered, reviewed and interpreted labs, which included *** I ordered medication *** and  reviewed PMP when indicated. I ordered imaging studies which included *** and I independently    visualized and interpreted imaging which showed *** Additional history obtained from *** Previous records obtained and reviewed *** I consulted *** and discussed lab and imaging findings and discussed disposition.  Cardiac monitoring reviewed, *** Social determinants considered, *** Critical Interventions: ***  After the interventions stated above, I reevaluated the patient and found *** Admission and further testing considered, ***   {Document critical care time when appropriate:1} {Document review of labs and clinical decision tools ie heart score, Chads2Vasc2 etc:1}  {Document your independent review of radiology images, and any outside records:1} {Document your discussion with family members, caretakers, and with consultants:1} {Document social determinants of health affecting pt's care:1} {Document your decision making why or why not admission, treatments were needed:1} Final Clinical Impression(s) / ED Diagnoses Final diagnoses:  None    Rx / DC Orders ED Discharge Orders     None

## 2023-02-14 NOTE — Assessment & Plan Note (Signed)
Continue with 2 L/min. Do not titrate

## 2023-02-14 NOTE — Assessment & Plan Note (Addendum)
Admit for end of life care. Pt's wishes stated clearly in MOST form and advanced directives.  Discussed with pt's HCPOA [name]. She agrees with comfort care.

## 2023-02-14 NOTE — ED Triage Notes (Signed)
BIBA from home for [facility]. Hx of lung CA, still smokes, also has CKD. Per caretaker, pt was breathing heavier than normal, HR 150s. Duoneb given PTA. 120/76 BP 87% on 2 lpm, 97% on NRB 20g right hand

## 2023-02-14 NOTE — ED Notes (Signed)
ED TO INPATIENT HANDOFF REPORT  Name/Age/Gender Christine Serrano 62 y.o. female  Code Status    Code Status Orders  (From admission, onward)           Start     Ordered   02/14/23 2037  Do not attempt resuscitation (DNR) - Comfort care  Continuous       Question Answer Comment  If patient has no pulse and is not breathing Do Not Attempt Resuscitation   In Pre-Arrest Conditions (Patient Is Breathing and Has a Pulse) Provide comfort measures. Relieve any mechanical airway obstruction. Avoid transfer unless required for comfort.   Consent: Discussion documented in EHR or advanced directives reviewed      02/14/23 2038           Code Status History     Date Active Date Inactive Code Status Order ID Comments User Context   02/14/2023 2016 02/14/2023 2038 Do not attempt resuscitation (DNR) - Comfort care [id number]  Carollee Herter, DO ED   11/12/2022 1608 11/15/2022 0420 DNR [id number]  Willeen Niece, MD ED   11/12/2022 1607 11/12/2022 1608 Full Code [id number]  Willeen Niece, MD ED   01/08/2020 1647 01/11/2020 1957 DNR [id number]  Jae Dire, MD ED   12/11/2019 0000 12/17/2019 1822 Partial Code [id number]  Eduard Clos, MD ED   09/11/2019 0550 09/12/2019 2042 Partial Code [id number]  John Giovanni, MD ED   09/11/2019 0510 09/11/2019 0550 Full Code [id number]  John Giovanni, MD ED   05/16/2019 0320 05/20/2019 1951 DNR [id number]  Therisa Doyne, MD Inpatient   04/09/2016 2240 04/11/2016 1604 Full Code [id number]  Gwendolyn Fill, NP ED       Home/SNF/Other Home  Chief Complaint End of life care [Z51.5]  Level of Care/Admitting Diagnosis ED Disposition     ED Disposition  Admit   Condition  --   Comment  Hospital Area: [facility] [[id number]]  Level of Care: Med-Surg [16]  May place patient in observation at [facility] or [facility] if equivalent level of care is available:: No  Covid Evaluation: Asymptomatic - no recent exposure (last 10  days) testing not required  Diagnosis: End of life care [id number]  Admitting Physician: Imogene Burn, ERIC [[id number]]  Attending Physician: Imogene Burn, ERIC [[id number]]          Medical History Past Medical History:  Diagnosis Date   Cervical cancer (HCC)    Chronic idiopathic constipation    Community acquired pneumonia of right upper lobe of lung 12/10/2016   COPD (chronic obstructive pulmonary disease) (HCC)    Depression    Endometriosis    HLD (hyperlipidemia)    Smoker     Allergies Allergies  Allergen Reactions   Terbinafine And Related Diarrhea    Diarrheal illness while on terbinafine ?related to med   Clarithromycin     Caused Thrush    Imitrex [Sumatriptan] Other (See Comments)    Worsens migraine     IV Location/Drains/Wounds Patient Lines/Drains/Airways Status     Active Line/Drains/Airways     Name Placement date Placement time Site Days   Peripheral IV 02/14/23 20 G Right;Posterior Hand 02/14/23  1816  Hand  less than 1   Peripheral IV 02/14/23 20 G Anterior;Left Forearm 02/14/23  1856  Forearm  less than 1            Labs/Imaging Results for orders placed or performed during the hospital encounter of 02/14/23 (from the past 48 hour(s))  SARS Coronavirus 2  by RT PCR (hospital order, performed in [facility] hospital lab) *cepheid single result test* Anterior Nasal Swab     Status: None   Collection Time: 02/14/23  6:37 PM   Specimen: Anterior Nasal Swab  Result Value Ref Range   SARS Coronavirus 2 by RT PCR NEGATIVE NEGATIVE    Comment: (NOTE) SARS-CoV-2 target nucleic acids are NOT DETECTED.  The SARS-CoV-2 RNA is generally detectable in upper and lower respiratory specimens during the acute phase of infection. The lowest concentration of SARS-CoV-2 viral copies this assay can detect is 250 copies / mL. A negative result does not preclude SARS-CoV-2 infection and should not be used as the sole basis for treatment or other patient management decisions.  A  negative result may occur with improper specimen collection / handling, submission of specimen other than nasopharyngeal swab, presence of viral mutation(s) within the areas targeted by this assay, and inadequate number of viral copies (<250 copies / mL). A negative result must be combined with clinical observations, patient history, and epidemiological information.  Fact Sheet for Patients:   RoadLapTop.co.za  Fact Sheet for Healthcare Providers: http://kim-miller.com/  This test is not yet approved or  cleared by the [place] FDA and has been authorized for detection and/or diagnosis of SARS-CoV-2 by FDA under an Emergency Use Authorization (EUA).  This EUA will remain in effect (meaning this test can be used) for the duration of the COVID-19 declaration under Section 564(b)(1) of the Act, 21 U.S.C. section 360bbb-3(b)(1), unless the authorization is terminated or revoked sooner.  Performed at [facility], [address] [place], [place] [address]   Comprehensive metabolic panel     Status: Abnormal   Collection Time: 02/14/23  6:50 PM  Result Value Ref Range   Sodium 136 135 - 145 mmol/L   Potassium 3.4 (L) 3.5 - 5.1 mmol/L   Chloride 89 (L) 98 - 111 mmol/L   CO2 38 (H) 22 - 32 mmol/L   Glucose, Bld 133 (H) 70 - 99 mg/dL    Comment: Glucose reference range applies only to samples taken after fasting for at least 8 hours.   BUN 9 8 - 23 mg/dL   Creatinine, Ser 1.91 (L) 0.44 - 1.00 mg/dL   Calcium 9.0 8.9 - 47.8 mg/dL   Total Protein 6.0 (L) 6.5 - 8.1 g/dL   Albumin 3.3 (L) 3.5 - 5.0 g/dL   AST 19 15 - 41 U/L   ALT 11 0 - 44 U/L   Alkaline Phosphatase 68 38 - 126 U/L   Total Bilirubin 0.2 (L) 0.3 - 1.2 mg/dL   GFR, Estimated >29 >56 mL/min    Comment: (NOTE) Calculated using the CKD-EPI Creatinine Equation (2021)    Anion gap 9 5 - 15    Comment: Performed at [facility], [address], [place], [place] [address]  Troponin I (High Sensitivity)     Status: None   Collection Time: 02/14/23  6:50 PM  Result Value Ref Range   Troponin I (High Sensitivity) 13 <18 ng/L    Comment: (NOTE) Elevated high sensitivity troponin I (hsTnI) values and significant  changes across serial measurements may suggest ACS but many other  chronic and acute conditions are known to elevate hsTnI results.  Refer to the "Links" section for chest pain algorithms and additional  guidance. Performed at [facility], [address] [place], [place] [address]   CBC with Differential     Status: Abnormal  Collection Time: 02/14/23  6:50 PM  Result Value Ref Range   WBC 7.6 4.0 - 10.5 K/uL   RBC 2.96 (L) 3.87 - 5.11 MIL/uL   Hemoglobin 8.3 (L) 12.0 - 15.0 g/dL   HCT 96.0 (L) 45.4 - 09.8 %   MCV 93.2 80.0 - 100.0 fL   MCH 28.0 26.0 - 34.0 pg   MCHC 30.1 30.0 - 36.0 g/dL   RDW 11.9 14.7 - 82.9 %   Platelets 168 150 - 400 K/uL   nRBC 0.0 0.0 - 0.2 %   Neutrophils Relative % 86 %   Neutro Abs 6.5 1.7 - 7.7 K/uL   Lymphocytes Relative 9 %   Lymphs Abs 0.7 0.7 - 4.0 K/uL   Monocytes Relative 5 %   Monocytes Absolute 0.3 0.1 - 1.0 K/uL   Eosinophils Relative 0 %   Eosinophils Absolute 0.0 0.0 - 0.5 K/uL   Basophils Relative 0 %   Basophils Absolute 0.0 0.0 - 0.1 K/uL   Immature Granulocytes 0 %   Abs Immature Granulocytes 0.02 0.00 - 0.07 K/uL    Comment: Performed at [facility], [address], [place], [place] [address]  Brain natriuretic peptide     Status: Abnormal   Collection Time: 02/14/23  6:50 PM  Result Value Ref Range   B Natriuretic Peptide 192.3 (H) 0.0 - 100.0 pg/mL    Comment: Performed at [facility], [address], [place], [place] [address]  Ethanol     Status: None   Collection Time: 02/14/23  6:50 PM  Result Value Ref Range   Alcohol, Ethyl (B) <10 <10 mg/dL    Comment: (NOTE) Lowest detectable limit  for serum alcohol is 10 mg/dL.  For medical purposes only. Performed at [facility], [address] [place], [place] [address]   I-Stat Lactic Acid     Status: None   Collection Time: 02/14/23  6:57 PM  Result Value Ref Range   Lactic Acid, Venous 0.5 0.5 - 1.9 mmol/L  Blood gas, venous     Status: Abnormal   Collection Time: 02/14/23  7:09 PM  Result Value Ref Range   pH, Ven 7.27 7.25 - 7.43   pCO2, Ven 104 (HH) 44 - 60 mmHg    Comment: CRITICAL RESULT CALLED TO, READ BACK BY AND VERIFIED WITH: CALLED TO Ladale Sherburn M. RN @ 1930 02/14/23 MCLEAN K.     pO2, Ven 63 (H) 32 - 45 mmHg   Bicarbonate 48.5 (H) 20.0 - 28.0 mmol/L   Acid-Base Excess 18.3 (H) 0.0 - 2.0 mmol/L   O2 Saturation 93.6 %   Patient temperature 36.1     Comment: Performed at [facility], [address], [place], [place] [address]   DG Chest Port 1 View  Result Date: 02/14/2023 CLINICAL DATA:  Shortness of breath.  History of lung cancer. EXAM: PORTABLE CHEST 1 VIEW COMPARISON:  11/12/2022 FINDINGS: There is hyperinflation of the lungs compatible with COPD. Bilateral upper lobe opacities are again noted likely related to fibrosis and chronic lung disease, stable since prior study. No definite acute infiltrate. Blunting of the left costophrenic angle may reflect small left pleural effusion. IMPRESSION: Emphysema. Chronic upper lobe densities are stable, likely fibrosis. Small left pleural effusion. Electronically Signed   By: Charlett Nose M.D.   On: 02/14/2023 19:44    Pending Labs [employer] (From admission, onward)     Start     Ordered  02/14/23 1833  Culture, blood (routine x 2)  BLOOD CULTURE X 2,   R (with STAT occurrences),   Status:  Canceled      02/14/23 1834            Vitals/Pain Today's Vitals   02/14/23 2015 02/14/23 2027 02/14/23 2100 02/14/23 2115  BP:   (!) 103/54   Pulse: (!) 125 (!) 127 (!) 118 (!) 126  Resp: (!) 21 (!) 28 (!) 21 (!) 22  Temp:       SpO2: (!) 72% (!) 61% (!) 75% (!) 53%    Isolation Precautions No active isolations  Medications Medications  acetaminophen (TYLENOL) tablet 650 mg (has no administration in time range)    Or  acetaminophen (TYLENOL) suppository 650 mg (has no administration in time range)  haloperidol (HALDOL) tablet 0.5 mg (has no administration in time range)    Or  haloperidol (HALDOL) 2 MG/ML solution 0.5 mg (has no administration in time range)    Or  haloperidol lactate (HALDOL) injection 0.5 mg (has no administration in time range)  ondansetron (ZOFRAN-ODT) disintegrating tablet 4 mg (has no administration in time range)    Or  ondansetron (ZOFRAN) injection 4 mg (has no administration in time range)  glycopyrrolate (ROBINUL) tablet 1 mg (has no administration in time range)    Or  glycopyrrolate (ROBINUL) injection 0.2 mg (has no administration in time range)    Or  glycopyrrolate (ROBINUL) injection 0.2 mg (has no administration in time range)  antiseptic oral rinse (BIOTENE) solution 15 mL (has no administration in time range)  polyvinyl alcohol (LIQUIFILM TEARS) 1.4 % ophthalmic solution 1 drop (has no administration in time range)  LORazepam (ATIVAN) tablet 1 mg (has no administration in time range)    Or  LORazepam (ATIVAN) 2 MG/ML concentrated solution 1 mg (has no administration in time range)    Or  LORazepam (ATIVAN) injection 1 mg (has no administration in time range)  HYDROmorphone (DILAUDID) injection 1 mg (has no administration in time range)  diphenhydrAMINE (BENADRYL) injection 12.5 mg (has no administration in time range)  albuterol (PROVENTIL) (2.5 MG/3ML) 0.083% nebulizer solution (10 mg/hr Nebulization Given 02/14/23 1855)  magnesium sulfate IVPB 2 g 50 mL (0 g Intravenous Stopped 02/14/23 1956)  methylPREDNISolone sodium succinate (SOLU-MEDROL) 125 mg/2 mL injection 125 mg (125 mg Intravenous Given 02/14/23 1856)    Mobility non-ambulatory

## 2023-02-14 NOTE — Subjective & Objective (Signed)
CC: SOB HPI: 62 yo WF with hx of end stage COPD, chronic respiratory failure with hypoxia/hypercapnia, still smoking, depression, presents to ER via EMS for SOB/wheezing. Pt obtunded on arrival to ER.  VBG pH 7.27, PCO2 104. Pt unable to give any hx or ROS due to obtunded state.  CXR shows hyperinflation  WBC 7.6, HgB 8.3  [facility] hospitalist consulted.

## 2023-02-14 NOTE — H&P (Addendum)
History and Physical    Christine Serrano ZOX:[id number] DOB: Sep 13, 1960 DOA: 02/14/2023  DOS: the patient was seen and examined on 02/14/2023  PCP: Eustaquio Boyden, MD   Patient coming from: Home  I have personally briefly reviewed patient's old medical records in [place] [facility]  CC: SOB HPI: 62 yo WF with hx of end stage COPD, chronic respiratory failure with hypoxia/hypercapnia, still smoking, depression, presents to ER via EMS for SOB/wheezing. Pt obtunded on arrival to ER.  VBG pH 7.27, PCO2 104. Pt unable to give any hx or ROS due to obtunded state.  CXR shows hyperinflation  WBC 7.6, HgB 8.3  [facility] consulted.   ED Course: VBG pH 7.27, PCO2 107  Review of Systems:  Review of Systems  Unable to perform ROS: Mental status change    Past Medical History:  Diagnosis Date   Cervical cancer (HCC)    Chronic idiopathic constipation    Community acquired pneumonia of right upper lobe of lung 12/10/2016   COPD (chronic obstructive pulmonary disease) (HCC)    Depression    Endometriosis    HLD (hyperlipidemia)    Smoker     Past Surgical History:  Procedure Laterality Date   ABLATION ON ENDOMETRIOSIS     APPENDECTOMY     BREAST EXCISIONAL BIOPSY Left age 79   benign biopsy - chronic fatty deposit   COLONOSCOPY WITH PROPOFOL N/A 08/04/2017   TAx3, angiodysplastic lesion, diverticulosis, rpt 5 yrs ([facility])   EXPLORATORY LAPAROTOMY  1992   endometriosis   FOOT SURGERY Right    needle imbeded   LASER ABLATION CONDYLOMA CERVICAL / VULVAR     NASAL SEPTUM SURGERY Bilateral    OVARIAN CYST SURGERY Bilateral 1980   remote   SALPINGOOPHORECTOMY Right 1983   ectopic pregnancy   TONSILLECTOMY       reports that she has been smoking cigarettes. She has a 51 pack-year smoking history. She has never used smokeless tobacco. She reports current alcohol use. She reports that she does not use drugs.  Allergies  Allergen Reactions   Terbinafine And Related  Diarrhea    Diarrheal illness while on terbinafine ?related to med   Clarithromycin     Caused Thrush    Imitrex [Sumatriptan] Other (See Comments)    Worsens migraine     Family History  Problem Relation Age of Onset   Heart disease Mother    Hypertension Mother    Hypertension Father    COPD Father    Hearing loss Maternal Grandmother    Hypertension Maternal Grandmother    Hearing loss Maternal Grandfather    Hypertension Maternal Grandfather    Colon cancer Paternal Grandmother     Prior to Admission medications   Medication Sig Start Date End Date Taking? Authorizing Provider  albuterol (PROVENTIL) (2.5 MG/3ML) 0.083% nebulizer solution INHALE CONTENTS OF 1 VIAL VIA NEBULIZER EVERY 6 HOURS AS NEEDED FOR WHEEZE OR SHORTNESS OF BREATH Patient taking differently: Take 2.5 mg by nebulization every 6 (six) hours as needed. 08/28/22   Salena Saner, MD  albuterol (VENTOLIN HFA) 108 (90 Base) MCG/ACT inhaler Inhale 1-2 puffs into the lungs every 6 (six) hours as needed for wheezing or shortness of breath. 02/04/23   Eustaquio Boyden, MD  benzonatate (TESSALON) 100 MG capsule Take 1 capsule (100 mg total) by mouth 3 (three) times daily as needed for cough. 01/06/23   Eustaquio Boyden, MD  BREZTRI AEROSPHERE 160-9-4.8 MCG/ACT AERO INHALE 2 PUFFS INTO THE LUNGS IN  THE MORNING AND AT BEDTIME. 08/12/22   Salena Saner, MD  buPROPion (WELLBUTRIN XL) 150 MG 24 hr tablet Take 1 tablet (150 mg total) by mouth daily. 02/04/23   Eustaquio Boyden, MD  cholecalciferol (VITAMIN D3) 25 MCG (1000 UNIT) tablet Take 1,000 Units by mouth daily.    [provider]  fluticasone (FLONASE) 50 MCG/ACT nasal spray Place 2 sprays into both nostrils daily. 06/06/20   Eustaquio Boyden, MD  folic acid (FOLVITE) 1 MG tablet Take 1 tablet (1 mg total) by mouth daily. 12/12/21   Eustaquio Boyden, MD  gabapentin (NEURONTIN) 100 MG capsule Take 1 capsule (100 mg total) by mouth 2 (two) times daily.  02/04/23   Eustaquio Boyden, MD  guaiFENesin (MUCINEX) 600 MG 12 hr tablet Take 1 tablet (600 mg total) by mouth 2 (two) times daily. 11/14/22   Glade Lloyd, MD  hydrOXYzine (ATARAX) 25 MG tablet Take 1 tablet (25 mg total) by mouth 2 (two) times daily as needed for anxiety. 11/07/22   Eustaquio Boyden, MD  Iron, Ferrous Sulfate, 325 (65 Fe) MG TABS Take 325 mg by mouth every other day. 02/12/23   Eustaquio Boyden, MD  LINZESS 290 MCG CAPS capsule Take 1 capsule (290 mcg total) by mouth daily as needed (constipation). 02/04/23   Eustaquio Boyden, MD  loratadine (CLARITIN) 10 MG tablet TAKE 1 TABLET BY MOUTH EVERY DAY AS NEEDED Patient taking differently: Take 10 mg by mouth daily as needed for allergies. 09/16/22   Eustaquio Boyden, MD  LORazepam (ATIVAN) 0.5 MG tablet Take 1 tablet (0.5 mg total) by mouth every 6 (six) hours as needed for anxiety or sleep. 02/04/23   Eustaquio Boyden, MD  meclizine (ANTIVERT) 25 MG tablet Take 1 tablet (25 mg total) by mouth 3 (three) times daily as needed for dizziness. 02/04/23   Eustaquio Boyden, MD  methocarbamol (ROBAXIN) 500 MG tablet Take 1 tablet (500 mg total) by mouth 3 (three) times daily between meals as needed for muscle spasms. 02/04/23   Eustaquio Boyden, MD  Morphine Sulfate (MORPHINE CONCENTRATE) 10 MG/0.5ML SOLN concentrated solution Place 0.25 mLs (5 mg total) under the tongue every 3 (three) hours as needed for severe pain, moderate pain or shortness of breath. 11/14/22   Glade Lloyd, MD  Multiple Vitamin (MULTIVITAMIN WITH MINERALS) TABS tablet Take 1 tablet by mouth daily. 09/12/19   Glade Lloyd, MD  naproxen (NAPROSYN) 375 MG tablet TAKE 1 TABLET BY MOUTH TWICE  DAILY AS NEEDED FOR PAIN WITH  FOOD 01/09/23   Eustaquio Boyden, MD  omeprazole (PRILOSEC) 40 MG capsule TAKE 1 CAPSULE BY MOUTH DAILY. FOR THREE WEEKS THEN AS NEEDED. TAKE 30 MIN PRIOR TO LARGE MEAL 02/04/23   Eustaquio Boyden, MD  OXYGEN Inhale 4 L/min into the lungs continuous.      [provider]  Potassium 99 MG TABS Take 99 mg by mouth daily.    [provider]  PROLIA 60 MG/ML SOSY injection Inject 60 mg into the skin every 6 (six) months. 08/06/22   [provider]  sertraline (ZOLOFT) 100 MG tablet Take 1.5 tablets (150 mg total) by mouth daily. 02/04/23   Eustaquio Boyden, MD  thiamine 100 MG tablet Take 1 tablet (100 mg total) by mouth daily. 09/12/19   Glade Lloyd, MD  traZODone (DESYREL) 50 MG tablet TAKE 1 TABLET BY MOUTH EVERYDAY AT BEDTIME 02/04/23   Eustaquio Boyden, MD  vitamin B-12 (CYANOCOBALAMIN) 1000 MCG tablet Take 1 tablet (1,000 mcg total) by mouth daily. 06/06/20  Eustaquio Boyden, MD    Physical Exam: Vitals:   02/14/23 1943 02/14/23 2000 02/14/23 2015 02/14/23 2027  BP:  108/65    Pulse: (!) 122 (!) 119 (!) 125 (!) 127  Resp: 18 18 (!) 21 (!) 28  Temp:      SpO2: 95% 99% (!) 72% (!) 61%    Physical Exam Vitals and nursing note reviewed.  Constitutional:      Comments: Chronically, cachetic, ill appearing  HENT:     Head: Normocephalic and atraumatic.  Cardiovascular:     Rate and Rhythm: Regular rhythm. Tachycardia present.  Pulmonary:     Effort: Respiratory distress present.     Breath sounds: Wheezing and rhonchi present.  Abdominal:     General: Abdomen is flat. There is no distension.  Musculoskeletal:     Right lower leg: No edema.     Left lower leg: No edema.  Skin:    General: Skin is warm and dry.     Capillary Refill: Capillary refill takes less than 2 seconds.  Neurological:     Comments: obtunded      Labs on Admission: I have personally reviewed following labs and imaging studies  CBC: Recent Labs  Lab 02/10/23 1610 02/14/23 1850  WBC 9.5 7.6  NEUTROABS 6.7 6.5  HGB 9.6* 8.3*  HCT 30.6* 27.6*  MCV 89.3 93.2  PLT 227.0 168   Basic Metabolic Panel: Recent Labs  Lab 02/10/23 1610 02/14/23 1850  NA 144 136  K 3.6 3.4*  CL 94* 89*  CO2 47* 38*  GLUCOSE 125* 133*   BUN 11 9  CREATININE 0.51 0.43*  CALCIUM 9.1 9.0  MG 1.6  --    GFR: Estimated Creatinine Clearance: 42.5 mL/min (A) (by C-G formula based on SCr of 0.43 mg/dL (L)). Liver Function Tests: Recent Labs  Lab 02/10/23 1610 02/14/23 1850  AST 18 19  ALT 7 11  ALKPHOS 73 68  BILITOT 0.3 0.2*  PROT 5.5* 6.0*  ALBUMIN 3.1* 3.3*   Cardiac Enzymes: Recent Labs  Lab 02/14/23 1850  TROPONINIHS 13   BNP (last 3 results) Recent Labs    02/14/23 1850  BNP 192.3*    Radiological Exams on Admission: I have personally reviewed images DG Chest Port 1 View  Result Date: 02/14/2023 CLINICAL DATA:  Shortness of breath.  History of lung cancer. EXAM: PORTABLE CHEST 1 VIEW COMPARISON:  11/12/2022 FINDINGS: There is hyperinflation of the lungs compatible with COPD. Bilateral upper lobe opacities are again noted likely related to fibrosis and chronic lung disease, stable since prior study. No definite acute infiltrate. Blunting of the left costophrenic angle may reflect small left pleural effusion. IMPRESSION: Emphysema. Chronic upper lobe densities are stable, likely fibrosis. Small left pleural effusion. Electronically Signed   By: Charlett Nose M.D.   On: 02/14/2023 19:44    EKG: My personal interpretation of EKG shows: sinus tachycardia    Assessment/Plan Principal Problem:   End of life care Active Problems:   End stage COPD (HCC)   Chronic respiratory failure with hypoxia, on home O2 therapy (HCC) - 2 L/min    Assessment and Plan: * End of life care Admit for end of life care. Pt's wishes stated clearly in MOST form and advanced directives.  Discussed with pt's HCPOA [name]. She agrees with comfort care.        End stage COPD (HCC) Pt still smoking  Chronic respiratory failure with hypoxia, on home O2 therapy (HCC) - 2  L/min Continue with 2 L/min. Do not titrate   DVT prophylaxis:  none due to comfort care Code Status: Comfort Care Family Communication:  discussed via phone with pt's HCPOA Amanda  Rupard Disposition Plan: in-house death expected  Consults called: none  Admission status: Observation, Med-Surg   Carollee Herter, DO [facility] 02/14/2023, 8:39 PM

## 2023-02-14 NOTE — Assessment & Plan Note (Signed)
Pt still smoking  ?

## 2023-02-14 NOTE — Progress Notes (Addendum)
Unable to obtain ABG- MD aware. Plans to order VBG. RN aware.

## 2023-02-15 DIAGNOSIS — R64 Cachexia: Secondary | ICD-10-CM | POA: Diagnosis present

## 2023-02-15 DIAGNOSIS — Z800 Family history of malignant neoplasm of digestive organs: Secondary | ICD-10-CM | POA: Diagnosis not present

## 2023-02-15 DIAGNOSIS — Z8541 Personal history of malignant neoplasm of cervix uteri: Secondary | ICD-10-CM | POA: Diagnosis not present

## 2023-02-15 DIAGNOSIS — M62838 Other muscle spasm: Secondary | ICD-10-CM | POA: Diagnosis present

## 2023-02-15 DIAGNOSIS — Z888 Allergy status to other drugs, medicaments and biological substances status: Secondary | ICD-10-CM | POA: Diagnosis not present

## 2023-02-15 DIAGNOSIS — J9611 Chronic respiratory failure with hypoxia: Secondary | ICD-10-CM | POA: Diagnosis present

## 2023-02-15 DIAGNOSIS — N189 Chronic kidney disease, unspecified: Secondary | ICD-10-CM | POA: Diagnosis present

## 2023-02-15 DIAGNOSIS — Z9981 Dependence on supplemental oxygen: Secondary | ICD-10-CM | POA: Diagnosis not present

## 2023-02-15 DIAGNOSIS — Z7189 Other specified counseling: Secondary | ICD-10-CM

## 2023-02-15 DIAGNOSIS — J449 Chronic obstructive pulmonary disease, unspecified: Secondary | ICD-10-CM | POA: Diagnosis present

## 2023-02-15 DIAGNOSIS — Z1152 Encounter for screening for COVID-19: Secondary | ICD-10-CM | POA: Diagnosis not present

## 2023-02-15 DIAGNOSIS — F32A Depression, unspecified: Secondary | ICD-10-CM | POA: Diagnosis present

## 2023-02-15 DIAGNOSIS — Z66 Do not resuscitate: Secondary | ICD-10-CM | POA: Diagnosis present

## 2023-02-15 DIAGNOSIS — Z881 Allergy status to other antibiotic agents status: Secondary | ICD-10-CM | POA: Diagnosis not present

## 2023-02-15 DIAGNOSIS — Z515 Encounter for palliative care: Secondary | ICD-10-CM | POA: Diagnosis present

## 2023-02-15 DIAGNOSIS — Z79899 Other long term (current) drug therapy: Secondary | ICD-10-CM | POA: Diagnosis not present

## 2023-02-15 DIAGNOSIS — K5904 Chronic idiopathic constipation: Secondary | ICD-10-CM | POA: Diagnosis present

## 2023-02-15 DIAGNOSIS — J9612 Chronic respiratory failure with hypercapnia: Secondary | ICD-10-CM | POA: Diagnosis present

## 2023-02-15 DIAGNOSIS — R404 Transient alteration of awareness: Secondary | ICD-10-CM | POA: Diagnosis present

## 2023-02-15 DIAGNOSIS — Z8249 Family history of ischemic heart disease and other diseases of the circulatory system: Secondary | ICD-10-CM | POA: Diagnosis not present

## 2023-02-15 DIAGNOSIS — E785 Hyperlipidemia, unspecified: Secondary | ICD-10-CM | POA: Diagnosis present

## 2023-02-15 DIAGNOSIS — F17200 Nicotine dependence, unspecified, uncomplicated: Secondary | ICD-10-CM | POA: Diagnosis present

## 2023-02-15 DIAGNOSIS — Z825 Family history of asthma and other chronic lower respiratory diseases: Secondary | ICD-10-CM | POA: Diagnosis not present

## 2023-02-15 MED ORDER — GLYCOPYRROLATE 1 MG PO TABS: 1.0000 mg | ORAL_TABLET | ORAL | 0 refills | Status: DC | PRN

## 2023-02-15 NOTE — TOC Initial Note (Signed)
Transition of Care [facility]) - Initial/Assessment Note    Patient Details  Name: Christine Serrano MRN: [id number] Date of Birth: 1960-06-30  Transition of [facility]) CM/SW Contact:    Adrian Prows, RN Phone Number: 02/15/2023, 5:27 PM  Clinical Narrative:                 TOC notified by Haynes Bast at [facility] pt would like to d/c home w/ hospice agency; spoke w/ pt in room; pt says she is from home; pt says she plans to return home w/ [facility] hospice; she identified POC Shann Medal (roommate) [phone]; she has transportation; pt has glasses and dentures (upper/lower); she has walker, crutches and electric wheel chair; pt says she does not have HH services; she has home oxygen w/ [employer]; pt says she has a full travel tank; Haynes Bast at notified; pt's roommate notified by [facility], RN; agency contact info placed in follow up provider section of d/c instructions; no TOC needs.  Expected Discharge Plan: Home w Hospice Care Barriers to Discharge: No Barriers Identified   Patient Goals and CMS Choice Patient states their goals for this hospitalization and ongoing recovery are:: home with hospice          Expected Discharge Plan and Services   Discharge Planning Services: CM Consult Post Acute Care Choice: Resumption of Svcs/PTA Provider (home with hospice; home oxygen) Living arrangements for the past 2 months: Single Family Home                                      Prior Living Arrangements/Services Living arrangements for the past 2 months: Single Family Home Lives with:: Friends Patient language and need for interpreter reviewed:: Yes Do you feel safe going back to the place where you live?: Yes      Need for Family Participation in Patient Care: Yes (Comment) Care giver support system in place?: Yes (comment) Current home services: DME (home oxygen w/ [employer]; walker, crutches, electric wheel chair) Criminal Activity/Legal Involvement Pertinent to Current  Situation/Hospitalization: No - Comment as needed  Activities of Daily Living      Permission Sought/Granted Permission sought to share information with : Case Manager Permission granted to share information with : Yes, Verbal Permission Granted  Share Information with NAME: Case Manager     Permission granted to share info w Relationship: Shann Medal (roommate)  [phone]     Emotional Assessment Appearance:: Appears stated age Attitude/Demeanor/Rapport: Gracious Affect (typically observed): Accepting Orientation: : Oriented to Self, Oriented to Place, Oriented to  Time, Oriented to Situation Alcohol / Substance Use: Not Applicable Psych Involvement: No (comment)  Admission diagnosis:  End of life care [Z51.5] Patient Active Problem List   Diagnosis Date Noted   End of life care 02/14/2023   Lung mass 11/18/2022   Left leg swelling 07/23/2022   Right lower quadrant abdominal pain 11/29/2021   Unintentional weight loss 11/29/2021   Protein-calorie malnutrition (HCC) 11/29/2021   Arm paresthesia, right 08/31/2021   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   Arm injury, right, initial encounter 10/24/2020   Fall with injury 10/24/2020   Dyspnea on exertion 08/23/2020   Low serum vitamin B12 06/09/2020   Cervical neck pain with evidence of disc disease 01/29/2020   Palliative care status 10/23/2019   Impaired mobility 10/20/2019   Chronic respiratory failure with hypoxia, on home O2 therapy (HCC) - 2  L/min 10/20/2019   Memory difficulties 10/16/2019   Folate deficiency 09/17/2019   Dizziness 09/11/2019   IDA (iron deficiency anemia) 09/11/2019   Hypocalcemia 09/11/2019   Systolic murmur 02/27/2019   Vertebral artery stenosis, left 02/27/2019   Medicare annual wellness visit, subsequent 02/25/2019   Advanced directives, counseling/discussion 02/25/2019   COPD exacerbation (HCC) 09/02/2018   Osteoporosis 05/23/2018   Numbness of right hand 10/10/2017    Positive hepatitis C antibody test 02/23/2017   Encounter for general adult medical examination with abnormal findings 02/21/2017   HLD (hyperlipidemia) 02/21/2017   Generalized anxiety disorder with panic attacks 12/10/2016   Tobacco abuse 11/28/2016   Aortic atherosclerosis (HCC) 11/26/2016   Tachycardia 11/01/2016   MDD (major depressive disorder), recurrent episode, severe (HCC) 07/03/2016   Chronic constipation 07/03/2016   Alcohol use 07/03/2016   End stage COPD (HCC) 07/03/2016   Tremor 07/03/2016   Chronic insomnia 07/03/2016   PCP:  Eustaquio Boyden, MD Pharmacy:   [facility] - [place], [place] - [address] [place] [address] Phone: [phone] Fax: [phone]  [id number] - [address], [place] - [address] [place] [address] Phone: [phone] Fax: [phone]  [facility] - [place], [place] - [address] [place] [address] Phone: [phone] Fax: [phone]  [facility] - [place], [place] - [address] [place] [address] Phone: [phone] Fax: [phone]  [facility] [place], [place] - [address] [place] [address] Phone: [phone] Fax: [phone]  [facility] - [address], [place] - [address] [place] [address] Phone: [phone] Fax: [phone]     Social Determinants of Health (SDOH) Social History: SDOH Screenings   Food Insecurity: No Food Insecurity (02/15/2023)  Housing: Low Risk  (02/15/2023)  Transportation Needs: No Transportation Needs (02/15/2023)  Utilities: Not At Risk (02/15/2023)  Alcohol Screen: Low Risk  (10/01/2022)  Depression (PHQ2-9): High Risk (02/04/2023)  Financial Resource Strain: Low Risk  (10/01/2022)  Physical Activity: Inactive (10/01/2022)   Social Connections: Socially Isolated (10/01/2022)  Stress: Stress Concern Present (10/01/2022)  Tobacco Use: High Risk (02/14/2023)   SDOH Interventions: Food Insecurity Interventions: Intervention Not Indicated, Inpatient TOC Housing Interventions: Intervention Not Indicated, Inpatient TOC Transportation Interventions: Intervention Not Indicated, Inpatient TOC Utilities Interventions: Intervention Not Indicated, Inpatient TOC   Readmission Risk Interventions    02/15/2023    5:23 PM 11/14/2022   10:20 AM  Readmission Risk Prevention Plan  Transportation Screening Complete Complete  PCP or Specialist Appt within 5-7 Days Complete   Home Care Screening Complete Complete  Medication Review (RN CM) Complete

## 2023-02-15 NOTE — Progress Notes (Signed)
[facility] Liaison Note  Received phone call from Sherlean Foot to inquire if patient is active with [employer] for hospice. Patient is currently active with our outpatient palliative program.   Amil Amen requested hospice services for patient at home after discharge. Contacted Burnard Bunting to request to proceed with hospice referral. Maylon Peppers confirmed.  Spoke with PCG and Marchelle Folks to discuss hospice services, hospice philosophy and team approach to care. Plan is for discharge home tomorrow. No DME needed at this time.  Please send completed and signed DNR with patient. Please ensure prescriptions are provided to ensure ongoing symptom management.  Please call with any hospice related questions or concerns.  Thank you, Haynes Bast, BSN, [facility] Liaison

## 2023-02-15 NOTE — Progress Notes (Signed)
PROGRESS NOTE    Christine Serrano  WGN:[id number] DOB: 11-Jun-1961 DOA: 02/14/2023 PCP: Eustaquio Boyden, MD   Brief Narrative:  This 62 yrs old  female with hx. of end stage COPD, chronic respiratory failure with hypoxia/hypercapnia, still smoking, depression, presents to ER via EMS for SOB/wheezing. Pt obtunded on arrival to ER.  VBG pH 7.27, PCO2 104. Pt unable to give any hx. or ROS due to obtunded state. CXR shows hyperinflation .WBC 7.6, HgB 8.3.  Patient is admitted for further management.  Assessment & Plan:   Principal Problem:   End of life care Active Problems:   End stage COPD (HCC)   Chronic respiratory failure with hypoxia, on home O2 therapy (HCC) - 2 L/min   End of life care: Patient is admitted for end of life care.  Pt's wishes stated clearly in MOST form and advanced directives.   Discussed with pt's HCPOA [name]. She agrees with comfort care.   End stage COPD (HCC) Pt still smoking.  Continue supplemental oxygen, continue nebulization.   Chronic respiratory failure with hypoxia, on home O2 therapy (HCC) - 2 L/min Continue with 2 L/min. Do not titrate    DVT prophylaxis: Comfort care Code Status: DNR Family Communication: No family at bed side Disposition Plan:     Status is: Inpatient Remains inpatient appropriate because:  Admitted for end of life care   Consultants:  None  Procedures: None  Antimicrobials:  Anti-infectives (From admission, onward)    None      Subjective: Patient was seen and examined at bedside.  Overnight events noted.   Patient reports she is feeling better and wants to be discharged.  Objective: Vitals:   02/14/23 2115 02/14/23 2230 02/15/23 0136 02/15/23 0331  BP:  (!) 98/54 (!) 85/71 100/62  Pulse: (!) 126 (!) 142 69 (!) 115  Resp: (!) 22 (!) 22 18 20   Temp:   97.7 F (36.5 C) 98.3 F (36.8 C)  TempSrc:   Oral   SpO2: (!) 53% (!) 66% 96% 97%    Intake/Output Summary (Last 24 hours) at 02/15/2023  1427 Last data filed at 02/14/2023 1956 Gross per 24 hour  Intake 50 ml  Output --  Net 50 ml   There were no vitals filed for this visit.  Examination:  General exam: Appears calm and comfortable, deconditioned, not in any acute distress. Respiratory system: Clear to auscultation. Respiratory effort normal. RR 13 Cardiovascular system: S1 & S2 heard, RRR. No JVD, murmurs, rubs, gallops or clicks. No pedal edema. Gastrointestinal system: Abdomen is non distended, soft and non tender. Normal bowel sounds heard. Central nervous system: Alert and oriented X 2. No focal neurological deficits. Extremities: No edema, no cyanosis, no clubbing Skin: No rashes, lesions or ulcers Psychiatry: Judgement and insight appear normal. Mood & affect appropriate.     Data Reviewed: I have personally reviewed following labs and imaging studies  CBC: Recent Labs  Lab 02/10/23 1610 02/14/23 1850  WBC 9.5 7.6  NEUTROABS 6.7 6.5  HGB 9.6* 8.3*  HCT 30.6* 27.6*  MCV 89.3 93.2  PLT 227.0 168   Basic Metabolic Panel: Recent Labs  Lab 02/10/23 1610 02/14/23 1850  NA 144 136  K 3.6 3.4*  CL 94* 89*  CO2 47* 38*  GLUCOSE 125* 133*  BUN 11 9  CREATININE 0.51 0.43*  CALCIUM 9.1 9.0  MG 1.6  --    GFR: Estimated Creatinine Clearance: 42.5 mL/min (A) (by C-G formula based on SCr of  0.43 mg/dL (L)). Liver Function Tests: Recent Labs  Lab 02/10/23 1610 02/14/23 1850  AST 18 19  ALT 7 11  ALKPHOS 73 68  BILITOT 0.3 0.2*  PROT 5.5* 6.0*  ALBUMIN 3.1* 3.3*   No results for input(s): "LIPASE", "AMYLASE" in the last 168 hours. No results for input(s): "AMMONIA" in the last 168 hours. Coagulation Profile: No results for input(s): "INR", "PROTIME" in the last 168 hours. Cardiac Enzymes: No results for input(s): "CKTOTAL", "CKMB", "CKMBINDEX", "TROPONINI" in the last 168 hours. BNP (last 3 results) Recent Labs    02/10/23 1610  PROBNP 201.0*   HbA1C: No results for input(s): "HGBA1C"  in the last 72 hours. CBG: No results for input(s): "GLUCAP" in the last 168 hours. Lipid Profile: No results for input(s): "CHOL", "HDL", "LDLCALC", "TRIG", "CHOLHDL", "LDLDIRECT" in the last 72 hours. Thyroid Function Tests: No results for input(s): "TSH", "T4TOTAL", "FREET4", "T3FREE", "THYROIDAB" in the last 72 hours. Anemia Panel: No results for input(s): "VITAMINB12", "FOLATE", "FERRITIN", "TIBC", "IRON", "RETICCTPCT" in the last 72 hours. Sepsis Labs: Recent Labs  Lab 02/14/23 1857  LATICACIDVEN 0.5    Recent Results (from the past 240 hour(s))  SARS Coronavirus 2 by RT PCR (hospital order, performed in [facility] hospital lab) *cepheid single result test* Anterior Nasal Swab     Status: None   Collection Time: 02/14/23  6:37 PM   Specimen: Anterior Nasal Swab  Result Value Ref Range Status   SARS Coronavirus 2 by RT PCR NEGATIVE NEGATIVE Final    Comment: (NOTE) SARS-CoV-2 target nucleic acids are NOT DETECTED.  The SARS-CoV-2 RNA is generally detectable in upper and lower respiratory specimens during the acute phase of infection. The lowest concentration of SARS-CoV-2 viral copies this assay can detect is 250 copies / mL. A negative result does not preclude SARS-CoV-2 infection and should not be used as the sole basis for treatment or other patient management decisions.  A negative result may occur with improper specimen collection / handling, submission of specimen other than nasopharyngeal swab, presence of viral mutation(s) within the areas targeted by this assay, and inadequate number of viral copies (<250 copies / mL). A negative result must be combined with clinical observations, patient history, and epidemiological information.  Fact Sheet for Patients:   RoadLapTop.co.za  Fact Sheet for Healthcare Providers: http://kim-miller.com/  This test is not yet approved or  cleared by the [place] FDA and has been  authorized for detection and/or diagnosis of SARS-CoV-2 by FDA under an Emergency Use Authorization (EUA).  This EUA will remain in effect (meaning this test can be used) for the duration of the COVID-19 declaration under Section 564(b)(1) of the Act, 21 U.S.C. section 360bbb-3(b)(1), unless the authorization is terminated or revoked sooner.  Performed at [facility], [address], [place], [place] [address]   Culture, blood (routine x 2)     Status: None (Preliminary result)   Collection Time: 02/14/23  6:50 PM   Specimen: BLOOD  Result Value Ref Range Status   Specimen Description   Final    BLOOD BLOOD RIGHT FOREARM Performed at [facility], [address] [place], [place] [address]    Special Requests   Final    Blood Culture adequate volume BOTTLES DRAWN AEROBIC AND ANAEROBIC Performed at [facility], [address] [place], [place] [address]    Culture   Final    NO GROWTH < 12 HOURS Performed at [facility], [address] [place], [place] [address]    Report Status PENDING  Incomplete  Culture, blood (routine x 2)     Status: None (Preliminary result)   Collection Time: 02/14/23  6:50 PM   Specimen: BLOOD  Result Value Ref Range Status   Specimen Description   Final    BLOOD BLOOD LEFT FOREARM Performed at [facility], [address], [place], [place] [address]    Special Requests   Final    Blood Culture adequate volume BOTTLES DRAWN AEROBIC AND ANAEROBIC Performed at [facility], [address], [place], [place] [address]    Culture   Final    NO GROWTH < 12 HOURS Performed at [facility], [address], [place], [place] [address]    Report Status PENDING  Incomplete    Radiology Studies: DG Chest Port 1 View  Result Date: 02/14/2023 CLINICAL DATA:  Shortness of breath.  History of lung cancer. EXAM: PORTABLE CHEST 1 VIEW COMPARISON:   11/12/2022 FINDINGS: There is hyperinflation of the lungs compatible with COPD. Bilateral upper lobe opacities are again noted likely related to fibrosis and chronic lung disease, stable since prior study. No definite acute infiltrate. Blunting of the left costophrenic angle may reflect small left pleural effusion. IMPRESSION: Emphysema. Chronic upper lobe densities are stable, likely fibrosis. Small left pleural effusion. Electronically Signed   By: Charlett Nose M.D.   On: 02/14/2023 19:44   Scheduled Meds: Continuous Infusions:   LOS: 0 days    Time spent: 35 mins    Willeen Niece, MD [facility]   If 7PM-7AM, please contact night-coverage

## 2023-02-15 NOTE — Consult Note (Signed)
Palliative Care Consult Note                                  Date: 02/15/2023   Patient Name: Christine Serrano  DOB: 24-Aug-1960  MRN: [id number]  Age / Sex: 62 y.o., female  PCP: Eustaquio Boyden, MD Referring Physician: Willeen Niece, MD  Reason for Consultation: Establishing goals of care  HPI/Patient Profile: Christine Serrano is a 62 y.o. female  with history of end-stage COPD, chronic respiratory failure with hypoxia/hypercapnia, still smoking, and depression who presented to the ED on 02/14/2023 with shortness of breath and wheezing.  She is admitted with end-stage COPD and chronic respiratory failure.    Subjective:   I have reviewed medical records including EPIC notes, labs and imaging.  I met with patient at bedside to discuss diagnosis, prognosis, GOC, EOL wishes, disposition, and options.  Patient is known to me from her hospitalization earlier this year in May.  I introduced Palliative Medicine as specialized medical care for people living with serious illness. It focuses on providing relief from the symptoms and stress of a serious illness.   We discussed patient's current illness and what it means in the larger context of her ongoing co-morbidities. Current clinical status was reviewed.   Created space and opportunity for patient to express thoughts and feelings regarding current medical situation. Values and goals of care were attempted to be elicited.  Today's Discussion: Patient was admitted 11/12/2022 through 11/14/2022 with acute on chronic respiratory failure.  The plan was for discharge home with hospice ([facility]).   However, patient apparently was never admitted into hospice, and instead has been in outpatient palliative care.   I spent time clarifying information between patient and [facility] hospice liaison. Patient reports understanding that hospice told her they would not pay for her portable oxygen  concentrator, and this is the reason she did accept services. I was able to confirm that hospice would indeed cover her portable oxygen concentrator.   Reviewed with patient my recommendation for home hospice, to provide additional support and symptom management when her condition further declines.  Discussed the indication for medication (opioids and benzodiazepines) to relieve dyspnea and anxiety associated with end-stage COPD.  Patient understands she has end-stage COPD and is agreeable to hospice services at home. She wants to spend her remaining time with her beloved animals and her friend Christine Serrano. She wants to go home as soon as possible.    Review of Systems  Respiratory:  Positive for shortness of breath.     Objective:   Primary Diagnoses: Present on Admission:  End stage COPD [facility])   Physical Exam Vitals reviewed.  Constitutional:      General: She is not in acute distress.    Appearance: She is cachectic. She is ill-appearing.  Pulmonary:     Effort: Pulmonary effort is normal.  Neurological:     Mental Status: She is alert and oriented to person, place, and time.     Vital Signs:  BP 100/62 (BP Location: Left Arm)   Pulse (!) 115   Temp 98.3 F (36.8 C)   Resp 20   LMP  (LMP Unknown)   SpO2 97%   Palliative Assessment/Data: PPS 50%     Assessment & Plan:   SUMMARY OF RECOMMENDATIONS   DNR/DNI Goal of care is comfort and quality of life Patient agreeable with home hospice and wants to discharge as soon  as possible Will need Rx for comfort meds at discharge (see below)   Primary Decision Maker: PATIENT   Symptom Management:  Morphine concentrate solution 5 mg every 3 hours as needed for pain or shortness of breath Ativan 0.5 mg PO every 6 hours as needed for anxiety or sleep  Prognosis:  < 6 months  Discharge Planning:  Home with Hospice    Thank you for allowing [place] to participate in the care of Christine Serrano   Time Total: 90  minutes  Greater than 50%  of this time was spent counseling and coordinating care related to the above assessment and plan.  Signed by: Sherlean Foot, NP Palliative Medicine Team  Team Phone # [phone]  For individual providers, please see AMION

## 2023-02-16 DIAGNOSIS — Z515 Encounter for palliative care: Secondary | ICD-10-CM | POA: Diagnosis not present

## 2023-02-16 MED ORDER — IPRATROPIUM-ALBUTEROL 0.5-2.5 (3) MG/3ML IN SOLN
3.0000 mL | Freq: Once | RESPIRATORY_TRACT | Status: AC
  Administered 2023-02-16: 3 mL via RESPIRATORY_TRACT
  Filled 2023-02-16: qty 3

## 2023-02-16 MED ORDER — SALINE SPRAY 0.65 % NA SOLN
1.0000 | NASAL | Status: DC | PRN
  Administered 2023-02-16: 1 via NASAL
  Filled 2023-02-16: qty 44

## 2023-02-16 NOTE — Plan of Care (Signed)
  Problem: Clinical Measurements: Goal: Quality of life will improve Outcome: Not Progressing   Problem: Clinical Measurements: Goal: Quality of life will improve Outcome: Not Progressing   Problem: Activity: Goal: Risk for activity intolerance will decrease Outcome: Not Progressing

## 2023-02-16 NOTE — Discharge Summary (Signed)
Physician Discharge Summary  Christine Serrano UEA:[id number] DOB: [birth year] DOA: 02/14/2023  PCP: Eustaquio Boyden, MD  Admit date: 02/14/2023  Discharge date: 02/16/2023  Admitted From: Home Disposition:  Home with hospice  Recommendations for Outpatient Follow-up:  Follow up with PCP in 1-2 weeks Please obtain BMP/CBC in one week Patient is being discharged back home with home hospice.  Home Health: None Equipment/Devices:None  Discharge Condition: Stable CODE STATUS:DNR Diet recommendation: Heart Healthy   Brief Summary/ Hospital Course: This 62 yrs old  female with hx. of end stage COPD, chronic respiratory failure with hypoxia/hypercapnia, still smoking, depression, presents to ER via EMS for SOB/wheezing. Pt obtunded on arrival to ER.  VBG pH 7.27, PCO2 104. Pt unable to give any hx. or ROS due to obtunded state. CXR shows hyperinflation .WBC 7.6, HgB 8.3.  Patient is admitted for further management.  Palliative care was consulted.  Patient had end-of-life discussion. Patient wishes to continue comfort care and she wants to be discharged.  Patient feels improved.  Family notified.  Patient being discharged home with hospice.  Discharge Diagnoses:  Principal Problem:   End of life care Active Problems:   End stage COPD (HCC)   Chronic respiratory failure with hypoxia, on home O2 therapy (HCC) - 2 L/min  End of life care: Patient is admitted for end of life care.  Pt's wishes stated clearly in MOST form and advanced directives.   Discussed with pt's HCPOA [name]. She agrees with comfort care.  Patient is being discharged home with home hospice.   End stage COPD (HCC) Pt still smoking.  Continue supplemental oxygen, continue nebulization.   Chronic respiratory failure with hypoxia, on home O2 therapy (HCC) - 2 L/min Continue with 2 L/min. Do not titrate  Discharge Instructions  Discharge Instructions     Call MD for:  difficulty breathing, headache or visual  disturbances   Complete by: As directed    Call MD for:  persistant dizziness or light-headedness   Complete by: As directed    Call MD for:  persistant nausea and vomiting   Complete by: As directed    Diet - low sodium heart healthy   Complete by: As directed    Diet Carb Modified   Complete by: As directed    Discharge instructions   Complete by: As directed    Patient is being discharged back home with home hospice.   Increase activity slowly   Complete by: As directed       Allergies as of 02/16/2023       Reactions   Terbinafine And Related Diarrhea   Diarrheal illness while on terbinafine ?related to med   Clarithromycin    Caused Thrush    Imitrex [sumatriptan] Other (See Comments)   Worsens migraine         Medication List     TAKE these medications    albuterol (2.5 MG/3ML) 0.083% nebulizer solution Commonly known as: PROVENTIL INHALE CONTENTS OF 1 VIAL VIA NEBULIZER EVERY 6 HOURS AS NEEDED FOR WHEEZE OR SHORTNESS OF BREATH What changed: See the new instructions.   albuterol 108 (90 Base) MCG/ACT inhaler Commonly known as: VENTOLIN HFA Inhale 1-2 puffs into the lungs every 6 (six) hours as needed for wheezing or shortness of breath. What changed: Another medication with the same name was changed. Make sure you understand how and when to take each.   benzonatate 100 MG capsule Commonly known as: TESSALON Take 1 capsule (100 mg total) by mouth  3 (three) times daily as needed for cough.   Breztri Aerosphere 160-9-4.8 MCG/ACT Aero Generic drug: Budeson-Glycopyrrol-Formoterol INHALE 2 PUFFS INTO THE LUNGS IN THE MORNING AND AT BEDTIME.   buPROPion 150 MG 24 hr tablet Commonly known as: Wellbutrin XL Take 1 tablet (150 mg total) by mouth daily.   cholecalciferol 25 MCG (1000 UNIT) tablet Commonly known as: VITAMIN D3 Take 1,000 Units by mouth daily.   cyanocobalamin 1000 MCG tablet Commonly known as: VITAMIN B12 Take 1 tablet (1,000 mcg total) by  mouth daily.   fluticasone 50 MCG/ACT nasal spray Commonly known as: FLONASE Place 2 sprays into both nostrils daily.   folic acid 1 MG tablet Commonly known as: FOLVITE Take 1 tablet (1 mg total) by mouth daily.   gabapentin 100 MG capsule Commonly known as: NEURONTIN Take 1 capsule (100 mg total) by mouth 2 (two) times daily.   glycopyrrolate 1 MG tablet Commonly known as: ROBINUL Take 1 tablet (1 mg total) by mouth every 4 (four) hours as needed (excessive secretions).   guaiFENesin 600 MG 12 hr tablet Commonly known as: MUCINEX Take 1 tablet (600 mg total) by mouth 2 (two) times daily.   hydrOXYzine 25 MG tablet Commonly known as: ATARAX Take 1 tablet (25 mg total) by mouth 2 (two) times daily as needed for anxiety.   Iron (Ferrous Sulfate) 325 (65 Fe) MG Tabs Take 325 mg by mouth every other day.   Linzess 290 MCG Caps capsule Generic drug: linaclotide Take 1 capsule (290 mcg total) by mouth daily as needed (constipation).   loratadine 10 MG tablet Commonly known as: CLARITIN TAKE 1 TABLET BY MOUTH EVERY DAY AS NEEDED What changed: reasons to take this   LORazepam 0.5 MG tablet Commonly known as: ATIVAN Take 1 tablet (0.5 mg total) by mouth every 6 (six) hours as needed for anxiety or sleep.   meclizine 25 MG tablet Commonly known as: ANTIVERT Take 1 tablet (25 mg total) by mouth 3 (three) times daily as needed for dizziness.   methocarbamol 500 MG tablet Commonly known as: ROBAXIN Take 1 tablet (500 mg total) by mouth 3 (three) times daily between meals as needed for muscle spasms.   morphine CONCENTRATE 10 MG/0.5ML Soln concentrated solution Place 0.25 mLs (5 mg total) under the tongue every 3 (three) hours as needed for severe pain, moderate pain or shortness of breath.   multivitamin with minerals Tabs tablet Take 1 tablet by mouth daily.   naproxen 375 MG tablet Commonly known as: NAPROSYN TAKE 1 TABLET BY MOUTH TWICE  DAILY AS NEEDED FOR PAIN WITH   FOOD   omeprazole 40 MG capsule Commonly known as: PRILOSEC TAKE 1 CAPSULE BY MOUTH DAILY. FOR THREE WEEKS THEN AS NEEDED. TAKE 30 MIN PRIOR TO LARGE MEAL   OXYGEN Inhale 4 L/min into the lungs continuous.   Potassium 99 MG Tabs Take 99 mg by mouth daily.   Prolia 60 MG/ML Sosy injection Generic drug: denosumab Inject 60 mg into the skin every 6 (six) months.   sertraline 100 MG tablet Commonly known as: ZOLOFT Take 1.5 tablets (150 mg total) by mouth daily.   thiamine 100 MG tablet Commonly known as: VITAMIN B1 Take 1 tablet (100 mg total) by mouth daily.   traZODone 50 MG tablet Commonly known as: DESYREL TAKE 1 TABLET BY MOUTH EVERYDAY AT BEDTIME        Follow-up Information     [facility] Follow up.   Specialty: Hospice and Palliative Medicine  Contact information: [address] [facility] [address] [phone]               Allergies  Allergen Reactions   Terbinafine And Related Diarrhea    Diarrheal illness while on terbinafine ?related to med   Clarithromycin     Caused Thrush    Imitrex [Sumatriptan] Other (See Comments)    Worsens migraine     Consultations: Palliative care   Procedures/Studies: DG Chest Port 1 View  Result Date: 02/14/2023 CLINICAL DATA:  Shortness of breath.  History of lung cancer. EXAM: PORTABLE CHEST 1 VIEW COMPARISON:  11/12/2022 FINDINGS: There is hyperinflation of the lungs compatible with COPD. Bilateral upper lobe opacities are again noted likely related to fibrosis and chronic lung disease, stable since prior study. No definite acute infiltrate. Blunting of the left costophrenic angle may reflect small left pleural effusion. IMPRESSION: Emphysema. Chronic upper lobe densities are stable, likely fibrosis. Small left pleural effusion. Electronically Signed   By: Charlett Nose M.D.   On: 02/14/2023 19:44      Subjective: Patient was seen and examined at bedside.  Overnight events noted.    Patient reports feeling better and wants to be discharged.  Patient is being discharged home with home hospice.  Discharge Exam: Vitals:   02/16/23 0349 02/16/23 1029  BP: (!) 146/91   Pulse: (!) 116   Resp: 16   Temp: 97.8 F (36.6 C)   SpO2: 94% 93%   Vitals:   02/15/23 0331 02/15/23 2222 02/16/23 0349 02/16/23 1029  BP: 100/62 138/81 (!) 146/91   Pulse: (!) 115 (!) 121 (!) 116   Resp: 20 20 16    Temp: 98.3 F (36.8 C) 98.3 F (36.8 C) 97.8 F (36.6 C)   TempSrc:  Oral Oral   SpO2: 97% 93% 94% 93%    General: Pt is alert, awake, not in acute distress Cardiovascular: RRR, S1/S2 +, no rubs, no gallops Respiratory: CTA bilaterally, no wheezing, no rhonchi Abdominal: Soft, NT, ND, bowel sounds + Extremities: no edema, no cyanosis    The results of significant diagnostics from this hospitalization (including imaging, microbiology, ancillary and laboratory) are listed below for reference.     Microbiology: Recent Results (from the past 240 hour(s))  SARS Coronavirus 2 by RT PCR (hospital order, performed in [facility] hospital lab) *cepheid single result test* Anterior Nasal Swab     Status: None   Collection Time: 02/14/23  6:37 PM   Specimen: Anterior Nasal Swab  Result Value Ref Range Status   SARS Coronavirus 2 by RT PCR NEGATIVE NEGATIVE Final    Comment: (NOTE) SARS-CoV-2 target nucleic acids are NOT DETECTED.  The SARS-CoV-2 RNA is generally detectable in upper and lower respiratory specimens during the acute phase of infection. The lowest concentration of SARS-CoV-2 viral copies this assay can detect is 250 copies / mL. A negative result does not preclude SARS-CoV-2 infection and should not be used as the sole basis for treatment or other patient management decisions.  A negative result may occur with improper specimen collection / handling, submission of specimen other than nasopharyngeal swab, presence of viral mutation(s) within the areas targeted by  this assay, and inadequate number of viral copies (<250 copies / mL). A negative result must be combined with clinical observations, patient history, and epidemiological information.  Fact Sheet for Patients:   RoadLapTop.co.za  Fact Sheet for Healthcare Providers: http://kim-miller.com/  This test is not yet approved or  cleared by the [place]  FDA and has been authorized for detection and/or diagnosis of SARS-CoV-2 by FDA under an Emergency Use Authorization (EUA).  This EUA will remain in effect (meaning this test can be used) for the duration of the COVID-19 declaration under Section 564(b)(1) of the Act, 21 U.S.C. section 360bbb-3(b)(1), unless the authorization is terminated or revoked sooner.  Performed at [facility], [address] [place], [place] [address]   Culture, blood (routine x 2)     Status: None (Preliminary result)   Collection Time: 02/14/23  6:50 PM   Specimen: BLOOD  Result Value Ref Range Status   Specimen Description   Final    BLOOD BLOOD RIGHT FOREARM Performed at [facility], [address], [place], [place] [address]    Special Requests   Final    Blood Culture adequate volume BOTTLES DRAWN AEROBIC AND ANAEROBIC Performed at [facility], [address] [place], [place] [address]    Culture   Final    NO GROWTH 2 DAYS Performed at [facility], [address], [place], [place] [address]    Report Status PENDING  Incomplete  Culture, blood (routine x 2)     Status: None (Preliminary result)   Collection Time: 02/14/23  6:50 PM   Specimen: BLOOD  Result Value Ref Range Status   Specimen Description   Final    BLOOD BLOOD LEFT FOREARM Performed at [facility], [address], [place], [place] [address]    Special Requests   Final    Blood Culture adequate volume BOTTLES DRAWN AEROBIC AND ANAEROBIC Performed at  [facility], [address] [place], [place] [address]    Culture   Final    NO GROWTH 2 DAYS Performed at [facility], [address] [place], [place] [address]    Report Status PENDING  Incomplete     Labs: BNP (last 3 results) Recent Labs    02/14/23 1850  BNP 192.3*   Basic Metabolic Panel: Recent Labs  Lab 02/10/23 1610 02/14/23 1850  NA 144 136  K 3.6 3.4*  CL 94* 89*  CO2 47* 38*  GLUCOSE 125* 133*  BUN 11 9  CREATININE 0.51 0.43*  CALCIUM 9.1 9.0  MG 1.6  --    Liver Function Tests: Recent Labs  Lab 02/10/23 1610 02/14/23 1850  AST 18 19  ALT 7 11  ALKPHOS 73 68  BILITOT 0.3 0.2*  PROT 5.5* 6.0*  ALBUMIN 3.1* 3.3*   No results for input(s): "LIPASE", "AMYLASE" in the last 168 hours. No results for input(s): "AMMONIA" in the last 168 hours. CBC: Recent Labs  Lab 02/10/23 1610 02/14/23 1850  WBC 9.5 7.6  NEUTROABS 6.7 6.5  HGB 9.6* 8.3*  HCT 30.6* 27.6*  MCV 89.3 93.2  PLT 227.0 168   Cardiac Enzymes: No results for input(s): "CKTOTAL", "CKMB", "CKMBINDEX", "TROPONINI" in the last 168 hours. BNP: Invalid input(s): "POCBNP" CBG: No results for input(s): "GLUCAP" in the last 168 hours. D-Dimer No results for input(s): "DDIMER" in the last 72 hours. Hgb A1c No results for input(s): "HGBA1C" in the last 72 hours. Lipid Profile No results for input(s): "CHOL", "HDL", "LDLCALC", "TRIG", "CHOLHDL", "LDLDIRECT" in the last 72 hours. Thyroid function studies No results for input(s): "TSH", "T4TOTAL", "T3FREE", "THYROIDAB" in the last 72 hours.  Invalid input(s): "FREET3" Anemia work up No results for input(s): "VITAMINB12", "FOLATE", "FERRITIN", "TIBC", "IRON", "RETICCTPCT" in the last 72 hours.  Urinalysis    Component Value Date/Time   COLORURINE YELLOW 11/12/2022 1752   APPEARANCEUR CLEAR 11/12/2022 1752   LABSPEC >1.046 (H) 11/12/2022 1752   PHURINE 6.0 11/12/2022 1752   GLUCOSEU NEGATIVE 11/12/2022 1752    HGBUR NEGATIVE 11/12/2022 1752   BILIRUBINUR NEGATIVE 11/12/2022 1752   KETONESUR 80 (A) 11/12/2022 1752   PROTEINUR 30 (A) 11/12/2022 1752   NITRITE NEGATIVE 11/12/2022 1752   LEUKOCYTESUR TRACE (A) 11/12/2022 1752   Sepsis Labs Recent Labs  Lab 02/10/23 1610 02/14/23 1850  WBC 9.5 7.6   Microbiology Recent Results (from the past 240 hour(s))  SARS Coronavirus 2 by RT PCR (hospital order, performed in [facility] hospital lab) *cepheid single result test* Anterior Nasal Swab     Status: None   Collection Time: 02/14/23  6:37 PM   Specimen: Anterior Nasal Swab  Result Value Ref Range Status   SARS Coronavirus 2 by RT PCR NEGATIVE NEGATIVE Final    Comment: (NOTE) SARS-CoV-2 target nucleic acids are NOT DETECTED.  The SARS-CoV-2 RNA is generally detectable in upper and lower respiratory specimens during the acute phase of infection. The lowest concentration of SARS-CoV-2 viral copies this assay can detect is 250 copies / mL. A negative result does not preclude SARS-CoV-2 infection and should not be used as the sole basis for treatment or other patient management decisions.  A negative result may occur with improper specimen collection / handling, submission of specimen other than nasopharyngeal swab, presence of viral mutation(s) within the areas targeted by this assay, and inadequate number of viral copies (<250 copies / mL). A negative result must be combined with clinical observations, patient history, and epidemiological information.  Fact Sheet for Patients:   RoadLapTop.co.za  Fact Sheet for Healthcare Providers: http://kim-miller.com/  This test is not yet approved or  cleared by the [place] FDA and has been authorized for detection and/or diagnosis of SARS-CoV-2 by FDA under an Emergency Use Authorization (EUA).  This EUA will remain in effect (meaning this test can be used) for the duration of the COVID-19  declaration under Section 564(b)(1) of the Act, 21 U.S.C. section 360bbb-3(b)(1), unless the authorization is terminated or revoked sooner.  Performed at [facility], [address] [place], [place] [address]   Culture, blood (routine x 2)     Status: None (Preliminary result)   Collection Time: 02/14/23  6:50 PM   Specimen: BLOOD  Result Value Ref Range Status   Specimen Description   Final    BLOOD BLOOD RIGHT FOREARM Performed at [facility], [address] [place], [place] [address]    Special Requests   Final    Blood Culture adequate volume BOTTLES DRAWN AEROBIC AND ANAEROBIC Performed at [facility], [address], [place], [place] [address]    Culture   Final    NO GROWTH 2 DAYS Performed at [facility], [address] [place], [place] [address]    Report Status PENDING  Incomplete  Culture, blood (routine x 2)     Status: None (Preliminary result)   Collection Time: 02/14/23  6:50 PM   Specimen: BLOOD  Result Value Ref Range Status   Specimen Description   Final    BLOOD BLOOD LEFT FOREARM Performed at [facility], [address], [place], [place] [address]    Special Requests   Final    Blood Culture adequate volume BOTTLES DRAWN AEROBIC AND ANAEROBIC Performed at [facility], [address] [place], [place] [address]    Culture   Final    NO GROWTH 2 DAYS Performed at [facility], [address] [place], [place] [address]    Report Status PENDING  Incomplete     Time coordinating discharge: Over 30 minutes  SIGNED:   Willeen Niece, MD  [facility] 02/16/2023, 2:47 PM Pager   If 7PM-7AM, please contact night-coverage

## 2023-02-18 ENCOUNTER — Telehealth: Payer: Self-pay | Admitting: Family Medicine

## 2023-02-18 NOTE — Telephone Encounter (Signed)
Ok for hospice to establish care. I can cancel venous lower extremity US order if that is needed for hospice to establish.

## 2023-02-18 NOTE — Telephone Encounter (Signed)
Marchelle Folks called in and stated that patient had an [place] scheduled but couldn't make it due to ending up in the ER.  She stated that they thought she was going to pass away Friday but she didn't. So now she is home because she wants to be home and pass away but hospice can't do anything without knowing if she need that [place] still. So hospice is wanting to know if she still need the [place] before they take over. Please advise. Thank you!

## 2023-02-19 ENCOUNTER — Telehealth: Payer: Self-pay | Admitting: Family Medicine

## 2023-02-19 LAB — CULTURE, BLOOD (ROUTINE X 2)
Culture: NO GROWTH
Culture: NO GROWTH
Special Requests: ADEQUATE
Special Requests: ADEQUATE

## 2023-02-19 NOTE — Telephone Encounter (Signed)
Tonya from [facility] would like to know if Dr Reece Agar would be the attending of record for this patient,and will he  be the attending physician for the certificate of terminal illness?

## 2023-02-19 NOTE — Telephone Encounter (Signed)
Spoke with Marchelle Folks (pt's caregiver- on dpr) relaying Dr Timoteo Expose message. She verbalizes understanding and will let us know if [place] needs to be canceled.

## 2023-02-20 NOTE — Telephone Encounter (Signed)
Yes I can be attending for this.

## 2023-02-20 NOTE — Telephone Encounter (Signed)
Spoke with Wilkie Aye, of [employer], notifying her Dr Reece Agar agrees to be the attending for pt. She verbalizes understanding and will inform Tonya.

## 2023-02-21 ENCOUNTER — Other Ambulatory Visit: Payer: Self-pay

## 2023-02-21 ENCOUNTER — Inpatient Hospital Stay: Payer: Medicare HMO

## 2023-02-21 ENCOUNTER — Emergency Department: Payer: Medicare HMO

## 2023-02-21 ENCOUNTER — Encounter: Payer: Self-pay | Admitting: Emergency Medicine

## 2023-02-21 ENCOUNTER — Inpatient Hospital Stay
Admission: EM | Admit: 2023-02-21 | Discharge: 2023-02-26 | DRG: 190 | Disposition: A | Discharge status: Hospice | Payer: Medicare HMO | Attending: Internal Medicine | Admitting: Internal Medicine

## 2023-02-21 DIAGNOSIS — J9621 Acute and chronic respiratory failure with hypoxia: Secondary | ICD-10-CM | POA: Diagnosis present

## 2023-02-21 DIAGNOSIS — T402X1A Poisoning by other opioids, accidental (unintentional), initial encounter: Secondary | ICD-10-CM | POA: Diagnosis present

## 2023-02-21 DIAGNOSIS — I959 Hypotension, unspecified: Secondary | ICD-10-CM | POA: Diagnosis not present

## 2023-02-21 DIAGNOSIS — J441 Chronic obstructive pulmonary disease with (acute) exacerbation: Secondary | ICD-10-CM | POA: Diagnosis not present

## 2023-02-21 DIAGNOSIS — Z515 Encounter for palliative care: Secondary | ICD-10-CM

## 2023-02-21 DIAGNOSIS — E785 Hyperlipidemia, unspecified: Secondary | ICD-10-CM | POA: Diagnosis present

## 2023-02-21 DIAGNOSIS — Z681 Body mass index (BMI) 19 or less, adult: Secondary | ICD-10-CM

## 2023-02-21 DIAGNOSIS — F410 Panic disorder [episodic paroxysmal anxiety] without agoraphobia: Secondary | ICD-10-CM | POA: Diagnosis present

## 2023-02-21 DIAGNOSIS — Z8249 Family history of ischemic heart disease and other diseases of the circulatory system: Secondary | ICD-10-CM

## 2023-02-21 DIAGNOSIS — R54 Age-related physical debility: Secondary | ICD-10-CM | POA: Diagnosis present

## 2023-02-21 DIAGNOSIS — R531 Weakness: Secondary | ICD-10-CM | POA: Diagnosis not present

## 2023-02-21 DIAGNOSIS — I5022 Chronic systolic (congestive) heart failure: Secondary | ICD-10-CM | POA: Diagnosis present

## 2023-02-21 DIAGNOSIS — F32A Depression, unspecified: Secondary | ICD-10-CM | POA: Diagnosis present

## 2023-02-21 DIAGNOSIS — I499 Cardiac arrhythmia, unspecified: Secondary | ICD-10-CM | POA: Diagnosis not present

## 2023-02-21 DIAGNOSIS — N390 Urinary tract infection, site not specified: Secondary | ICD-10-CM | POA: Diagnosis present

## 2023-02-21 DIAGNOSIS — I4891 Unspecified atrial fibrillation: Secondary | ICD-10-CM | POA: Diagnosis not present

## 2023-02-21 DIAGNOSIS — J9622 Acute and chronic respiratory failure with hypercapnia: Secondary | ICD-10-CM | POA: Diagnosis not present

## 2023-02-21 DIAGNOSIS — Z66 Do not resuscitate: Secondary | ICD-10-CM | POA: Diagnosis not present

## 2023-02-21 DIAGNOSIS — Z1152 Encounter for screening for COVID-19: Secondary | ICD-10-CM

## 2023-02-21 DIAGNOSIS — K5904 Chronic idiopathic constipation: Secondary | ICD-10-CM | POA: Diagnosis present

## 2023-02-21 DIAGNOSIS — J439 Emphysema, unspecified: Secondary | ICD-10-CM | POA: Diagnosis not present

## 2023-02-21 DIAGNOSIS — F17210 Nicotine dependence, cigarettes, uncomplicated: Secondary | ICD-10-CM | POA: Diagnosis present

## 2023-02-21 DIAGNOSIS — F411 Generalized anxiety disorder: Secondary | ICD-10-CM | POA: Diagnosis present

## 2023-02-21 DIAGNOSIS — Z789 Other specified health status: Secondary | ICD-10-CM | POA: Diagnosis not present

## 2023-02-21 DIAGNOSIS — Z79899 Other long term (current) drug therapy: Secondary | ICD-10-CM

## 2023-02-21 DIAGNOSIS — Z7951 Long term (current) use of inhaled steroids: Secondary | ICD-10-CM

## 2023-02-21 DIAGNOSIS — F1090 Alcohol use, unspecified, uncomplicated: Secondary | ICD-10-CM | POA: Diagnosis present

## 2023-02-21 DIAGNOSIS — Z825 Family history of asthma and other chronic lower respiratory diseases: Secondary | ICD-10-CM

## 2023-02-21 DIAGNOSIS — Z888 Allergy status to other drugs, medicaments and biological substances status: Secondary | ICD-10-CM

## 2023-02-21 DIAGNOSIS — Z720 Tobacco use: Secondary | ICD-10-CM | POA: Diagnosis not present

## 2023-02-21 DIAGNOSIS — I482 Chronic atrial fibrillation, unspecified: Secondary | ICD-10-CM | POA: Diagnosis present

## 2023-02-21 DIAGNOSIS — I517 Cardiomegaly: Secondary | ICD-10-CM | POA: Diagnosis present

## 2023-02-21 DIAGNOSIS — Z8541 Personal history of malignant neoplasm of cervix uteri: Secondary | ICD-10-CM | POA: Diagnosis not present

## 2023-02-21 DIAGNOSIS — R64 Cachexia: Secondary | ICD-10-CM | POA: Diagnosis present

## 2023-02-21 DIAGNOSIS — I700 Atherosclerosis of aorta: Secondary | ICD-10-CM | POA: Diagnosis not present

## 2023-02-21 DIAGNOSIS — G9341 Metabolic encephalopathy: Secondary | ICD-10-CM | POA: Diagnosis present

## 2023-02-21 DIAGNOSIS — R4182 Altered mental status, unspecified: Secondary | ICD-10-CM | POA: Diagnosis not present

## 2023-02-21 DIAGNOSIS — Z800 Family history of malignant neoplasm of digestive organs: Secondary | ICD-10-CM

## 2023-02-21 DIAGNOSIS — R918 Other nonspecific abnormal finding of lung field: Secondary | ICD-10-CM | POA: Diagnosis not present

## 2023-02-21 DIAGNOSIS — Z9981 Dependence on supplemental oxygen: Secondary | ICD-10-CM

## 2023-02-21 LAB — URINALYSIS, W/ REFLEX TO CULTURE (INFECTION SUSPECTED)
Bilirubin Urine: NEGATIVE
Glucose, UA: NEGATIVE mg/dL
Hgb urine dipstick: NEGATIVE
Ketones, ur: NEGATIVE mg/dL
Nitrite: NEGATIVE
Protein, ur: NEGATIVE mg/dL
Specific Gravity, Urine: 1.016 (ref 1.005–1.030)
pH: 5 (ref 5.0–8.0)

## 2023-02-21 LAB — CBC WITH DIFFERENTIAL/PLATELET
Abs Immature Granulocytes: 0.03 10*3/uL (ref 0.00–0.07)
Basophils Absolute: 0 10*3/uL (ref 0.0–0.1)
Basophils Relative: 0 %
Eosinophils Absolute: 0.4 10*3/uL (ref 0.0–0.5)
Eosinophils Relative: 4 %
HCT: 31.7 % — ABNORMAL LOW (ref 36.0–46.0)
Hemoglobin: 9.4 g/dL — ABNORMAL LOW (ref 12.0–15.0)
Immature Granulocytes: 0 %
Lymphocytes Relative: 42 %
Lymphs Abs: 3.9 10*3/uL (ref 0.7–4.0)
MCH: 28.1 pg (ref 26.0–34.0)
MCHC: 29.7 g/dL — ABNORMAL LOW (ref 30.0–36.0)
MCV: 94.9 fL (ref 80.0–100.0)
Monocytes Absolute: 0.7 10*3/uL (ref 0.1–1.0)
Monocytes Relative: 7 %
Neutro Abs: 4.4 10*3/uL (ref 1.7–7.7)
Neutrophils Relative %: 47 %
Platelets: 212 10*3/uL (ref 150–400)
RBC: 3.34 MIL/uL — ABNORMAL LOW (ref 3.87–5.11)
RDW: 13.1 % (ref 11.5–15.5)
WBC: 9.5 10*3/uL (ref 4.0–10.5)
nRBC: 0 % (ref 0.0–0.2)

## 2023-02-21 LAB — RESPIRATORY PANEL BY PCR

## 2023-02-21 LAB — COMPREHENSIVE METABOLIC PANEL
ALT: 14 U/L (ref 0–44)
AST: 21 U/L (ref 15–41)
Albumin: 3.2 g/dL — ABNORMAL LOW (ref 3.5–5.0)
Alkaline Phosphatase: 62 U/L (ref 38–126)
Anion gap: 7 (ref 5–15)
BUN: 15 mg/dL (ref 8–23)
CO2: 39 mmol/L — ABNORMAL HIGH (ref 22–32)
Calcium: 9 mg/dL (ref 8.9–10.3)
Chloride: 94 mmol/L — ABNORMAL LOW (ref 98–111)
Creatinine, Ser: 0.46 mg/dL (ref 0.44–1.00)
GFR, Estimated: >60 mL/min (ref >60)
Glucose, Bld: 165 mg/dL — ABNORMAL HIGH (ref 70–99)
Potassium: 3.7 mmol/L (ref 3.5–5.1)
Sodium: 140 mmol/L (ref 135–145)
Total Bilirubin: 0.1 mg/dL — ABNORMAL LOW (ref 0.3–1.2)
Total Protein: 6 g/dL — ABNORMAL LOW (ref 6.5–8.1)

## 2023-02-21 LAB — URINALYSIS, ROUTINE W REFLEX MICROSCOPIC
Bilirubin Urine: NEGATIVE
Glucose, UA: NEGATIVE mg/dL
Hgb urine dipstick: NEGATIVE
Ketones, ur: NEGATIVE mg/dL
Nitrite: NEGATIVE
Protein, ur: NEGATIVE mg/dL
Specific Gravity, Urine: 1.015 (ref 1.005–1.030)
pH: 5 (ref 5.0–8.0)

## 2023-02-21 LAB — APTT: aPTT: 28 s (ref 24–36)

## 2023-02-21 LAB — BLOOD GAS, VENOUS
Acid-Base Excess: 6.9 mmol/L — ABNORMAL HIGH (ref 0.0–2.0)
Bicarbonate: 37.7 mmol/L — ABNORMAL HIGH (ref 20.0–28.0)
O2 Saturation: 85.5 %
Patient temperature: 37
pCO2, Ven: 88 mmHg (ref 44–60)
pH, Ven: 7.24 — ABNORMAL LOW (ref 7.25–7.43)
pO2, Ven: 52 mmHg — ABNORMAL HIGH (ref 32–45)

## 2023-02-21 LAB — TROPONIN I (HIGH SENSITIVITY)
Troponin I (High Sensitivity): 16 ng/L (ref <18)
Troponin I (High Sensitivity): 16 ng/L (ref <18)

## 2023-02-21 LAB — CBG MONITORING, ED: Glucose-Capillary: 137 mg/dL — ABNORMAL HIGH (ref 70–99)

## 2023-02-21 LAB — URINE DRUG SCREEN, QUALITATIVE (ARMC ONLY)
Amphetamines, Ur Screen: NOT DETECTED
Barbiturates, Ur Screen: NOT DETECTED
Benzodiazepine, Ur Scrn: NOT DETECTED
Cannabinoid 50 Ng, Ur ~~LOC~~: NOT DETECTED
Cocaine Metabolite,Ur ~~LOC~~: NOT DETECTED
MDMA (Ecstasy)Ur Screen: NOT DETECTED
Methadone Scn, Ur: NOT DETECTED
Opiate, Ur Screen: POSITIVE — AB
Phencyclidine (PCP) Ur S: NOT DETECTED
Tricyclic, Ur Screen: NOT DETECTED

## 2023-02-21 LAB — BRAIN NATRIURETIC PEPTIDE: B Natriuretic Peptide: 424.2 pg/mL — ABNORMAL HIGH (ref 0.0–100.0)

## 2023-02-21 LAB — PROCALCITONIN: Procalcitonin: <0.1 ng/mL

## 2023-02-21 LAB — SARS CORONAVIRUS 2 BY RT PCR: SARS Coronavirus 2 by RT PCR: NEGATIVE

## 2023-02-21 LAB — ETHANOL: Alcohol, Ethyl (B): <10 mg/dL (ref <10)

## 2023-02-21 LAB — LACTIC ACID, PLASMA
Lactic Acid, Venous: 1.1 mmol/L (ref 0.5–1.9)
Lactic Acid, Venous: 0.5 mmol/L (ref 0.5–1.9)

## 2023-02-21 LAB — PROTIME-INR
INR: 0.9 (ref 0.8–1.2)
Prothrombin Time: 12.4 s (ref 11.4–15.2)

## 2023-02-21 LAB — GLUCOSE, CAPILLARY: Glucose-Capillary: 130 mg/dL — ABNORMAL HIGH (ref 70–99)

## 2023-02-21 LAB — MRSA NEXT GEN BY PCR, NASAL: MRSA by PCR Next Gen: NOT DETECTED

## 2023-02-21 MED ORDER — DOCUSATE SODIUM 100 MG PO CAPS: 100.0000 mg | ORAL_CAPSULE | Freq: Two times a day (BID) | ORAL | Status: DC | PRN

## 2023-02-21 MED ORDER — LACTATED RINGERS IV BOLUS: 1000.0000 mL | Freq: Once | INTRAVENOUS | Status: DC

## 2023-02-21 MED ORDER — NOREPINEPHRINE 4 MG/250ML-% IV SOLN
2.0000 ug/min | INTRAVENOUS | Status: DC
  Filled 2023-02-21: qty 250

## 2023-02-21 MED ORDER — LACTATED RINGERS IV BOLUS
1000.0000 mL | Freq: Once | INTRAVENOUS | Status: AC
  Administered 2023-02-21: 1000 mL via INTRAVENOUS

## 2023-02-21 MED ORDER — ALPRAZOLAM 0.5 MG PO TABS
0.5000 mg | ORAL_TABLET | Freq: Two times a day (BID) | ORAL | Status: DC | PRN
  Administered 2023-02-21 – 2023-02-26 (×5): 0.5 mg via ORAL
  Filled 2023-02-21 (×7): qty 1

## 2023-02-21 MED ORDER — POLYETHYLENE GLYCOL 3350 17 G PO PACK: 17.0000 g | PACK | Freq: Every day | ORAL | Status: DC | PRN

## 2023-02-21 MED ORDER — FENTANYL CITRATE PF 50 MCG/ML IJ SOSY
50.0000 ug | PREFILLED_SYRINGE | Freq: Once | INTRAMUSCULAR | Status: AC
  Administered 2023-02-21: 50 ug via INTRAVENOUS
  Filled 2023-02-21: qty 1

## 2023-02-21 MED ORDER — SODIUM CHLORIDE 0.9 % IV SOLN: Freq: Once | INTRAVENOUS | Status: AC

## 2023-02-21 MED ORDER — BUDESONIDE 0.25 MG/2ML IN SUSP
0.2500 mg | Freq: Two times a day (BID) | RESPIRATORY_TRACT | Status: DC
  Administered 2023-02-21 – 2023-02-26 (×11): 0.25 mg via RESPIRATORY_TRACT
  Filled 2023-02-21 (×12): qty 2

## 2023-02-21 MED ORDER — VANCOMYCIN HCL IN DEXTROSE 1-5 GM/200ML-% IV SOLN
1000.0000 mg | Freq: Once | INTRAVENOUS | Status: AC
  Administered 2023-02-21: 1000 mg via INTRAVENOUS
  Filled 2023-02-21: qty 200

## 2023-02-21 MED ORDER — BUPROPION HCL ER (XL) 150 MG PO TB24
150.0000 mg | ORAL_TABLET | Freq: Every day | ORAL | Status: DC
  Administered 2023-02-22 – 2023-02-26 (×5): 150 mg via ORAL
  Filled 2023-02-21 (×5): qty 1

## 2023-02-21 MED ORDER — METHYLPREDNISOLONE SODIUM SUCC 40 MG IJ SOLR
40.0000 mg | Freq: Every day | INTRAMUSCULAR | Status: DC
  Administered 2023-02-21 – 2023-02-26 (×6): 40 mg via INTRAVENOUS
  Filled 2023-02-21 (×6): qty 1

## 2023-02-21 MED ORDER — SODIUM CHLORIDE 0.9 % IV SOLN
2.0000 g | Freq: Once | INTRAVENOUS | Status: AC
  Administered 2023-02-21: 2 g via INTRAVENOUS
  Filled 2023-02-21: qty 12.5

## 2023-02-21 MED ORDER — NALOXONE HCL 0.4 MG/ML IJ SOLN
0.4000 mg | Freq: Once | INTRAMUSCULAR | Status: AC
  Administered 2023-02-21: 0.4 mg via INTRAVENOUS

## 2023-02-21 MED ORDER — NALOXONE HCL 4 MG/10ML IJ SOLN
0.2500 mg/h | INTRAVENOUS | Status: DC
  Administered 2023-02-21 (×3): 0.25 mg/h via INTRAVENOUS
  Filled 2023-02-21 (×2): qty 10

## 2023-02-21 MED ORDER — SODIUM CHLORIDE 0.9 % IV SOLN
1.0000 g | INTRAVENOUS | Status: DC
  Administered 2023-02-21: 1 g via INTRAVENOUS
  Filled 2023-02-21 (×2): qty 10

## 2023-02-21 MED ORDER — NALOXONE HCL 0.4 MG/ML IJ SOLN
0.1000 mg | Freq: Once | INTRAMUSCULAR | Status: AC
  Administered 2023-02-21: 0.1 mg via INTRAVENOUS
  Filled 2023-02-21: qty 1

## 2023-02-21 MED ORDER — ORAL CARE MOUTH RINSE: 15.0000 mL | OROMUCOSAL | Status: DC | PRN

## 2023-02-21 MED ORDER — SODIUM CHLORIDE 0.9 % IV SOLN
250.0000 mL | INTRAVENOUS | Status: DC
  Administered 2023-02-21: 250 mL via INTRAVENOUS

## 2023-02-21 MED ORDER — ETOMIDATE 2 MG/ML IV SOLN
2.0000 mg | Freq: Once | INTRAVENOUS | Status: DC
  Filled 2023-02-21: qty 10

## 2023-02-21 MED ORDER — CHLORHEXIDINE GLUCONATE CLOTH 2 % EX PADS
6.0000 | MEDICATED_PAD | Freq: Every day | CUTANEOUS | Status: DC
  Administered 2023-02-21 – 2023-02-23 (×2): 6 via TOPICAL

## 2023-02-21 MED ORDER — ETOMIDATE 2 MG/ML IV SOLN
4.0000 mg | Freq: Once | INTRAVENOUS | Status: AC
  Administered 2023-02-21: 4 mg via INTRAVENOUS

## 2023-02-21 MED ORDER — SERTRALINE HCL 50 MG PO TABS
100.0000 mg | ORAL_TABLET | Freq: Every day | ORAL | Status: DC
  Administered 2023-02-22 – 2023-02-26 (×5): 100 mg via ORAL
  Filled 2023-02-21 (×5): qty 2

## 2023-02-21 MED ORDER — NALOXONE HCL 0.4 MG/ML IJ SOLN
INTRAMUSCULAR | Status: AC
  Filled 2023-02-21: qty 1

## 2023-02-21 MED ORDER — ACETAMINOPHEN 325 MG PO TABS
650.0000 mg | ORAL_TABLET | Freq: Four times a day (QID) | ORAL | Status: DC | PRN
  Administered 2023-02-21 – 2023-02-22 (×4): 650 mg via ORAL
  Filled 2023-02-21 (×4): qty 2

## 2023-02-21 MED ORDER — INFLUENZA VIRUS VACC SPLIT PF (FLUZONE) 0.5 ML IM SUSY
0.5000 mL | PREFILLED_SYRINGE | INTRAMUSCULAR | Status: DC
  Filled 2023-02-21: qty 0.5

## 2023-02-21 MED ORDER — MORPHINE SULFATE (PF) 2 MG/ML IV SOLN
0.5000 mg | INTRAVENOUS | Status: DC | PRN
  Administered 2023-02-22 – 2023-02-26 (×5): 1 mg via INTRAVENOUS
  Filled 2023-02-21 (×5): qty 1

## 2023-02-21 MED ORDER — ENSURE ENLIVE PO LIQD
237.0000 mL | Freq: Two times a day (BID) | ORAL | Status: DC
  Administered 2023-02-21 – 2023-02-26 (×8): 237 mL via ORAL

## 2023-02-21 MED ORDER — IPRATROPIUM-ALBUTEROL 0.5-2.5 (3) MG/3ML IN SOLN
3.0000 mL | Freq: Four times a day (QID) | RESPIRATORY_TRACT | Status: DC
  Administered 2023-02-21 – 2023-02-22 (×5): 3 mL via RESPIRATORY_TRACT
  Filled 2023-02-21 (×4): qty 3

## 2023-02-21 NOTE — ED Notes (Signed)
Pt transport to ICU 2 at this time.

## 2023-02-21 NOTE — ED Notes (Signed)
Pt noted to desat to 83% on 4L [place]. Pt's O2 increased to 6L [place], remains in ST at this time. Repeat EKG and VS obtained at this time.

## 2023-02-21 NOTE — H&P (Shared)
NAME:  Christine Serrano, MRN:  [id number], DOB:  1961/05/20, LOS: 0 ADMISSION DATE:  02/21/2023, CONSULTATION DATE:  02/21/23 REFERRING MD:  Dr. Adaline Sill, CHIEF COMPLAINT: Shortness of Breath    History of Present Illness:  62 yo F presenting to [facility] ED from home via EMS for evaluation of shortness of breath.  History obtained per chart review as patient is too altered to participate in bedside interview at this time. Patient unable to answer any questions regarding how she has been feeling at home.  Only able to answer, " I do not know".  Upon arrival after receiving Narcan via EMS she told EDP she felt like she was going to die at home and felt terrible got " worked up" and called 911.  She currently lives with her roommate Marchelle Folks. 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  After returning home via telephone encounters it appears [facility] is trying to establish care with the patient through her primary care provider.  EMS found the patient hypotensive, hypoxic with pinpoint pimples and new onset A-fib with RVR.  They administered Narcan with improvement of consciousness as well as IV fluids DuoNebs and steroids with improvement in blood pressure.  ED course: Upon arrival reporting fatigue and shortness of breath and new onset A-fib RVR with hypotension.  Patient hypothermic, tachypneic, tachycardic and hypotensive. Due to A-fib RVR emergent cardioversion was discussed with the patient while she was awake and alert and she was agreeable to the procedure.  The patient was given sedation requiring emergent cardioversion with return to sinus rhythm.  Labs significant for elevated BNP, low chloride and elevated serum bicarb.  Imaging concerning for multifocal pneumonia versus underlying malignancy. After cardioversion patient hypoxic with respiratory depression given Narcan again with positive impact on mentation, sepsis  protocol initiated with IV fluid resuscitation and empiric antibiotics. However due to hypotension after IV fluid resuscitation PCCM consulted for peripheral vasopressor support . medications given: Etomidate, fentanyl, Narcan, 2 L IV fluid bolus, cefepime/vancomycin Initial Vitals: 95.8, 20, 135, 88/64 and 100% on 4 L nasal cannula Significant labs: (Labs/ Imaging personally reviewed) Chemistry: Na+: 140, K+: 3.7, BUN/Cr.:  15/0.46, Serum CO2/ AG: 39/7, Cl: 94 Hematology: WBC: 9.5, Hgb: 9.4,  Troponin: 16 > 16, BNP: 424.2, Lactic/ PCT: 1.1> 0.5/ <0.10, COVID-19: Negative  ABG: pending CXR 02/21/23: *** CT ***: ***  PCCM consulted for admission due to ***.  Pertinent  Medical History  ***  Significant Hospital Events: Including procedures, antibiotic start and stop dates in addition to other pertinent events     Interim History / Subjective:  ***  Objective   Blood pressure 99/66, pulse (!) 125, temperature (!) 97.5 F (36.4 C), temperature source Rectal, resp. rate 17, height 5\' 5"  (1.651 m), weight 38 kg, SpO2 92%.       No intake or output data in the 24 hours ending 02/21/23 0640 Filed Weights   02/21/23 0219  Weight: 38 kg    Examination: General: Adult female, critically ill, lying in bed, NAD HEENT: MM pale/dry, anicteric, atraumatic, neck supple Neuro: A&O x 1, able to follow simple persistent commands, PERRL +1, MAE CV: s1s2 RRR, ST on monitor, no r/m/g Pulm: Regular, non labored on 5 L [place], breath sounds coarse/diminished-BUL & diminished-BLL GI: soft, flat, non tender, bs x 4 Skin:  no rashes/lesions noted Extremities: warm/dry, pulses + 2 R/P, +1 edema noted bilateral ankles  Resolved Hospital Problem  list     Assessment & Plan:  Acute on chronic Hypoxic/ Hypercapnic Respiratory Failure secondary to end stage COPD Suspected Opioid Overdose  PMHx: Tobacco Use, Severe COPD, chronic opioid use Patient on chronic opioids outpatient, unable to answer questions  about how much morphine she had taken earlier this evening. She received narcan with EMS. And then again in the ED, especially after sedatives for cardioversion - Supplemental O2 to maintain SpO2 > 88% - Intermittent chest x-ray & ABG PRN - Ensure adequate pulmonary hygiene  - consult palliative care for hospice planning - budesonide inhaler/***nebs BID, bronchodilators PRN   Best Practice (right click and "Reselect all SmartList Selections" daily)  Diet/type: {diet type:25684} DVT prophylaxis: {anticoagulation (Optional):25687} GI prophylaxis: {ZO:10960} Lines: {Central Venous Access:25771} Foley:  {Central Venous Access:25691} Code Status:  {Code Status:26939} Last date of multidisciplinary goals of care discussion [***]  Labs   CBC: Recent Labs  Lab 02/14/23 1850 02/21/23 0230  WBC 7.6 9.5  NEUTROABS 6.5 4.4  HGB 8.3* 9.4*  HCT 27.6* 31.7*  MCV 93.2 94.9  PLT 168 212    Basic Metabolic Panel: Recent Labs  Lab 02/14/23 1850 02/21/23 0230  NA 136 140  K 3.4* 3.7  CL 89* 94*  CO2 38* 39*  GLUCOSE 133* 165*  BUN 9 15  CREATININE 0.43* 0.46  CALCIUM 9.0 9.0   GFR: Estimated Creatinine Clearance: 42.6 mL/min (by C-G formula based on SCr of 0.46 mg/dL). Recent Labs  Lab 02/14/23 1850 02/14/23 1857 02/21/23 0227 02/21/23 0230 02/21/23 0503  PROCALCITON  --   --   --  <0.10  --   WBC 7.6  --   --  9.5  --   LATICACIDVEN  --  0.5 1.1  --  0.5    Liver Function Tests: Recent Labs  Lab 02/14/23 1850 02/21/23 0230  AST 19 21  ALT 11 14  ALKPHOS 68 62  BILITOT 0.2* 0.1*  PROT 6.0* 6.0*  ALBUMIN 3.3* 3.2*   No results for input(s): "LIPASE", "AMYLASE" in the last 168 hours. No results for input(s): "AMMONIA" in the last 168 hours.  ABG    Component Value Date/Time   PHART 7.371 05/16/2019 0205   PCO2ART 52.1 (H) 05/16/2019 0205   PO2ART 192 (H) 05/16/2019 0205   HCO3 48.5 (H) 02/14/2023 1909   TCO2 25.9 04/04/2009 1430   O2SAT 93.6 02/14/2023 1909      Coagulation Profile: Recent Labs  Lab 02/21/23 0230  INR 0.9    Cardiac Enzymes: No results for input(s): "CKTOTAL", "CKMB", "CKMBINDEX", "TROPONINI" in the last 168 hours.  HbA1C: No results found for: "HGBA1C"  CBG: Recent Labs  Lab 02/21/23 0246  GLUCAP 137*    Review of Systems:   Patient unable to participate in interview at this time due to confusion  Past Medical History:  She,  has a past medical history of Cervical cancer (HCC), Chronic idiopathic constipation, Community acquired pneumonia of right upper lobe of lung (12/10/2016), COPD (chronic obstructive pulmonary disease) (HCC), Depression, Endometriosis, HLD (hyperlipidemia), and Smoker.   Surgical History:   Past Surgical History:  Procedure Laterality Date   ABLATION ON ENDOMETRIOSIS     APPENDECTOMY     BREAST EXCISIONAL BIOPSY Left age 25   benign biopsy - chronic fatty deposit   COLONOSCOPY WITH PROPOFOL N/A 08/04/2017   TAx3, angiodysplastic lesion, diverticulosis, rpt 5 yrs ([facility])   EXPLORATORY LAPAROTOMY  1992   endometriosis   FOOT SURGERY Right    needle imbeded  LASER ABLATION CONDYLOMA CERVICAL / VULVAR     NASAL SEPTUM SURGERY Bilateral    OVARIAN CYST SURGERY Bilateral 1980   remote   SALPINGOOPHORECTOMY Right 1983   ectopic pregnancy   TONSILLECTOMY       Social History:   reports that she has been smoking cigarettes. She has a 51 pack-year smoking history. She has never used smokeless tobacco. She reports current alcohol use. She reports that she does not use drugs.   Family History:  Her family history includes COPD in her father; Colon cancer in her paternal grandmother; Hearing loss in her maternal grandfather and maternal grandmother; Heart disease in her mother; Hypertension in her father, maternal grandfather, maternal grandmother, and mother.   Allergies Allergies  Allergen Reactions   Terbinafine And Related Diarrhea    Diarrheal illness while on terbinafine  ?related to med   Clarithromycin     Caused Thrush    Imitrex [Sumatriptan] Other (See Comments)    Worsens migraine      Home Medications  Prior to Admission medications   Medication Sig Start Date End Date Taking? Authorizing Provider  albuterol (PROVENTIL) (2.5 MG/3ML) 0.083% nebulizer solution INHALE CONTENTS OF 1 VIAL VIA NEBULIZER EVERY 6 HOURS AS NEEDED FOR WHEEZE OR SHORTNESS OF BREATH Patient taking differently: Take 2.5 mg by nebulization every 6 (six) hours as needed. 08/28/22   Salena Saner, MD  albuterol (VENTOLIN HFA) 108 (90 Base) MCG/ACT inhaler Inhale 1-2 puffs into the lungs every 6 (six) hours as needed for wheezing or shortness of breath. 02/04/23   Eustaquio Boyden, MD  benzonatate (TESSALON) 100 MG capsule Take 1 capsule (100 mg total) by mouth 3 (three) times daily as needed for cough. 01/06/23   Eustaquio Boyden, MD  BREZTRI AEROSPHERE 160-9-4.8 MCG/ACT AERO INHALE 2 PUFFS INTO THE LUNGS IN THE MORNING AND AT BEDTIME. 08/12/22   Salena Saner, MD  buPROPion (WELLBUTRIN XL) 150 MG 24 hr tablet Take 1 tablet (150 mg total) by mouth daily. 02/04/23   Eustaquio Boyden, MD  cholecalciferol (VITAMIN D3) 25 MCG (1000 UNIT) tablet Take 1,000 Units by mouth daily.    [provider]  fluticasone (FLONASE) 50 MCG/ACT nasal spray Place 2 sprays into both nostrils daily. 06/06/20   Eustaquio Boyden, MD  folic acid (FOLVITE) 1 MG tablet Take 1 tablet (1 mg total) by mouth daily. 12/12/21   Eustaquio Boyden, MD  gabapentin (NEURONTIN) 100 MG capsule Take 1 capsule (100 mg total) by mouth 2 (two) times daily. 02/04/23   Eustaquio Boyden, MD  glycopyrrolate (ROBINUL) 1 MG tablet Take 1 tablet (1 mg total) by mouth every 4 (four) hours as needed (excessive secretions). 02/15/23   Willeen Niece, MD  guaiFENesin (MUCINEX) 600 MG 12 hr tablet Take 1 tablet (600 mg total) by mouth 2 (two) times daily. 11/14/22   Glade Lloyd, MD  hydrOXYzine (ATARAX) 25 MG tablet Take 1  tablet (25 mg total) by mouth 2 (two) times daily as needed for anxiety. 11/07/22   Eustaquio Boyden, MD  Iron, Ferrous Sulfate, 325 (65 Fe) MG TABS Take 325 mg by mouth every other day. 02/12/23   Eustaquio Boyden, MD  LINZESS 290 MCG CAPS capsule Take 1 capsule (290 mcg total) by mouth daily as needed (constipation). 02/04/23   Eustaquio Boyden, MD  loratadine (CLARITIN) 10 MG tablet TAKE 1 TABLET BY MOUTH EVERY DAY AS NEEDED Patient taking differently: Take 10 mg by mouth daily as needed for allergies. 09/16/22   Eustaquio Boyden,  MD  LORazepam (ATIVAN) 0.5 MG tablet Take 1 tablet (0.5 mg total) by mouth every 6 (six) hours as needed for anxiety or sleep. 02/04/23   Eustaquio Boyden, MD  meclizine (ANTIVERT) 25 MG tablet Take 1 tablet (25 mg total) by mouth 3 (three) times daily as needed for dizziness. 02/04/23   Eustaquio Boyden, MD  methocarbamol (ROBAXIN) 500 MG tablet Take 1 tablet (500 mg total) by mouth 3 (three) times daily between meals as needed for muscle spasms. 02/04/23   Eustaquio Boyden, MD  Morphine Sulfate (MORPHINE CONCENTRATE) 10 MG/0.5ML SOLN concentrated solution Place 0.25 mLs (5 mg total) under the tongue every 3 (three) hours as needed for severe pain, moderate pain or shortness of breath. 11/14/22   Glade Lloyd, MD  Multiple Vitamin (MULTIVITAMIN WITH MINERALS) TABS tablet Take 1 tablet by mouth daily. 09/12/19   Glade Lloyd, MD  naproxen (NAPROSYN) 375 MG tablet TAKE 1 TABLET BY MOUTH TWICE  DAILY AS NEEDED FOR PAIN WITH  FOOD 01/09/23   Eustaquio Boyden, MD  omeprazole (PRILOSEC) 40 MG capsule TAKE 1 CAPSULE BY MOUTH DAILY. FOR THREE WEEKS THEN AS NEEDED. TAKE 30 MIN PRIOR TO LARGE MEAL 02/04/23   Eustaquio Boyden, MD  OXYGEN Inhale 4 L/min into the lungs continuous.     [provider]  Potassium 99 MG TABS Take 99 mg by mouth daily.    [provider]  PROLIA 60 MG/ML SOSY injection Inject 60 mg into the skin every 6 (six) months. 08/06/22   [provider]  sertraline (ZOLOFT) 100 MG tablet Take 1.5 tablets (150 mg total) by mouth daily. 02/04/23   Eustaquio Boyden, MD  thiamine 100 MG tablet Take 1 tablet (100 mg total) by mouth daily. 09/12/19   Glade Lloyd, MD  traZODone (DESYREL) 50 MG tablet TAKE 1 TABLET BY MOUTH EVERYDAY AT BEDTIME 02/04/23   Eustaquio Boyden, MD  vitamin B-12 (CYANOCOBALAMIN) 1000 MCG tablet Take 1 tablet (1,000 mcg total) by mouth daily. 06/06/20   Eustaquio Boyden, MD     Critical care time: ***

## 2023-02-21 NOTE — ED Notes (Signed)
Bear hugger removed per CCM NP verbal order. Warm blankets applied.

## 2023-02-21 NOTE — Consult Note (Signed)
Consultation Note Date: 02/21/2023   Patient Name: Christine Serrano  DOB: [birth year]  MRN: [id number]  Age / Sex: 62 y.o., female  PCP: Christine Boyden, MD Referring Physician: Erin Fulling, MD  Reason for Consultation: Establishing goals of care   HPI/Brief Hospital Course: 62 y.o. female  with past medical history of end stage COPD on 4L [place] at baseline admitted from home on 02/21/2023 with acute on chronic hypoxic and hypercapnic respiratory failure, unintentional overdose and UTI.  CXR 9/6 IMPRESSION: 1. Persistent bilateral upper lobe and right lower lobe patchy airspace opacities. Finding could represent multifocal pneumonia with underlying malignancy not excluded. Recommend PET-CT as noted on CT angio chest 11/12/2022. 2. Aortic Atherosclerosis (ICD10-I70.0) and Emphysema (ICD10-J43.9).  Noted recent admit to [facility] for COPD exacerbation  Palliative medicine was consulted for assisting with goals of care conversations.  Subjective:  Extensive chart review has been completed prior to meeting patient including labs, vital signs, imaging, progress notes, orders, and available advanced directive documents from current and previous encounters.  Visited with Christine Serrano at her bedside. Awake, alert, confused and unable to answer orientation questions appropriately, drifts back to sleep without redirection. Unable to engage in goals of care conversations.  TOC at bedside during time of visit. No family/friends.  Spoke with TOC-patient possibly active with [facility]. Awaiting call back from HL regarding patient status.  HL shares Christine Serrano has been followed as a palliative patient with recent change in goals to transition to hospice care-RN home assessment for hospice was scheduled for 9/8.  Called and spoke with Amanda-caregiver/roommate  Introduced myself as a Publishing rights manager as a member of the palliative care team. Explained palliative  medicine is specialized medical care for people living with serious illness. It focuses on providing relief from the symptoms and stress of a serious illness. The goal is to improve quality of life for both the patient and the family.   Marchelle Folks shares Christine Serrano has been previously prescribed SL Morphine as needed and on day of admission, administered a dose to Christine Serrano. Apparently, Christine Serrano did not recall taking a dose a later re-administered Morphine possibly contributing to admission symptoms.  Marchelle Folks shares goal/plan remains for [name] to return home with hospice services following-HL and TOC made aware. According to Marchelle Folks she works part time but is primarily able to care for Christine Serrano in their home.  Reviewed MOST form completed 10/26/2022 that reflects comfort care.  Spoke with Dr. Belia Heman, made aware of situation and current plans moving forward. Dr. Belia Heman aware follow-up over weekend will be needed as PMT will not have weekend coverage.  Objective: Primary Diagnoses: Present on Admission:  Atrial fibrillation with RVR (HCC)   Vital Signs: BP 117/75   Pulse (!) 120   Temp 98.3 F (36.8 C) (Oral)   Resp (!) 28   Ht 5\' 5"  (1.651 m)   Wt 45.3 kg   LMP  (LMP Unknown)   SpO2 94%   BMI 16.62 kg/m  Pain Scale: 0-10   Pain Score: 0-No pain   IO: Intake/output summary:  Intake/Output Summary (Last 24 hours) at 02/21/2023 1605 Last data filed at 02/21/2023 1032 Gross per 24 hour  Intake 361.73 ml  Output --  Net 361.73 ml    LBM: Last BM Date : 02/20/23 Baseline Weight: Weight: 38 kg Most recent weight: Weight: 45.3 kg       Discussed With: ICU attending, TOC, [facility] and nursing staff   Thank you for  this consult and allowing Palliative Medicine to participate in the care of Christine Serrano. Palliative medicine will continue to follow and assist as needed.   Time Total: 40 minutes  Time spent includes: Detailed review of medical records (labs, imaging,  vital signs), medically appropriate exam (mental status, respiratory, cardiac, skin), discussed with treatment team, counseling and educating patient, family and staff, documenting clinical information, medication management and coordination of care.   Signed by: Leeanne Deed, DNP, AGNP-C Palliative Medicine    Please contact Palliative Medicine Team phone at [phone] for questions and concerns.  For individual provider: See Loretha Stapler

## 2023-02-21 NOTE — ED Notes (Signed)
IV team at bedside 

## 2023-02-21 NOTE — Consult Note (Signed)
PHARMACY CONSULT NOTE - FOLLOW UP  Pharmacy Consult for Electrolyte Monitoring and Replacement   Recent Labs: Potassium (mmol/L)  Date Value  02/21/2023  3.7   Magnesium (mg/dL)  Date Value  -- --   Calcium (mg/dL)  Date Value  13/24/4010 9.0   Calcium, Total (mg/dL)  Date Value  -- --   Albumin (g/dL)  Date Value  27/25/3664 3.2 (L)   Phosphorus (mg/dL)  Date Value  -- --   Sodium (mmol/L)  Date Value  02/21/2023 140    Assessment: Christine Serrano ia 62 y.o. female admitted 02/21/2023 for shortness of breath and possible overdose. PMH includes COPD on chronic 4L, HLD, current smoker, depression, cervical cancer, endometriosis.   Goal of Therapy:  Electrolytes WNL  Plan:  No electrolyte supplementation needed today Recheck electrolytes tomorrow morning  Darolyn Rua, PharmD Student [employer]

## 2023-02-21 NOTE — Progress Notes (Signed)
0830 patient came from ER on narcan gtt alert x4 on hf 1000 narcan gtt turned off 1600 patient lethargic not waking up following some commands nacran IV push given patient more alert

## 2023-02-21 NOTE — ED Notes (Signed)
Pt taken to CT.

## 2023-02-21 NOTE — Progress Notes (Signed)
Initial Nutrition Assessment  DOCUMENTATION CODES:   Underweight  INTERVENTION:   Ensure Enlive po BID, each supplement provides 350 kcal and 20 grams of protein.  Regular diet   Pt at high refeed risk; recommend monitor potassium, magnesium and phosphorus labs daily until stable  Daily weights   NUTRITION DIAGNOSIS:   Increased nutrient needs related to catabolic illness as evidenced by estimated needs.  GOAL:   Patient will meet greater than or equal to 90% of their needs  MONITOR:   Supplement acceptance, PO intake, Labs, Weight trends, Skin, I & O's  REASON FOR ASSESSMENT:   Malnutrition Screening Tool    ASSESSMENT:   62 y.o. female with PMHx significant for End Stage COPD on 4 L supplemental Oxygen at baseline, Afib, MDD, etoh abuse, anxiety, HLD, Hep C, IDA, lung mass, folate deficiency and followed by home hospice who is admitted with AECOPD, acute metabolic encephalopathy  unintentional opioid overdose and UTI.  RD working remotely.  Pt is well known to the nutrition department from previous admissions. Pt with chronic poor appetite and oral intake at baseline. Pt previously diagnosed with severe malnutrition; RD unable to diagnose at this time as NFPE cannot be performed. Pt previously reported that she drinks two Boost supplements per day at home. Pt is currently ordered for Ensure. Per chart, pt appears weight stable at baseline but is currently noted to be up ~16lbs from her UBW. Pt is noted to have moderate edema. Recommend continue Ensure for patient pleasure. Pt followed by hospice at home. Palliative care consult is pending.   Medications reviewed and include: solu-medrol, ceftriaxone  Labs reviewed: K 3.7 wnl BNP 424.2(H)- 9/6 Hgb 9.4(L), Hct 31.7(L) Cbgs- 130, 137 x 24 hrs   NUTRITION - FOCUSED PHYSICAL EXAM: Unable to perform at this time   Diet Order:   Diet Order             Diet regular Room service appropriate? Yes; Fluid consistency:  Thin  Diet effective now                  EDUCATION NEEDS:   No education needs have been identified at this time  Skin:  Skin Assessment: Reviewed RN Assessment  Last BM:  9/5  Height:   Ht Readings from Last 1 Encounters:  02/21/23 5\' 5"  (1.651 m)    Weight:   Wt Readings from Last 1 Encounters:  02/21/23 45.3 kg    Ideal Body Weight:  56.8 kg  BMI:  Body mass index is 16.62 kg/m.  Estimated Nutritional Needs:   Kcal:  1400-1600kcal/day  Protein:  70-80g/day  Fluid:  1.2-1.4L/day  Betsey Holiday MS, RD, LDN Please refer to [facility] for RD and/or RD on-call/weekend/after hours pager

## 2023-02-21 NOTE — ED Notes (Signed)
Pt cardioverated at 200J, pt not in ST on the monitor. Pt tolerated well. This RN, Rosalie Doctor, RN, Megan, RN, EDP, and RT remain at bedside at this time.

## 2023-02-21 NOTE — ED Notes (Signed)
Pco2 is 80 and new Rn in ICU informed of critical value.

## 2023-02-21 NOTE — ED Notes (Signed)
EKG signed by MD, Katrinka Blazing.

## 2023-02-21 NOTE — Progress Notes (Signed)
Standby for cardioversion, blowby supp 02 provided via ambu post cardiovert for noted temp desats. Pt now on 6 lpm [place] with sp02 95-96%, RR 15. Nursing bedside to continue to montor.

## 2023-02-21 NOTE — ED Provider Notes (Signed)
[facility] Provider Note    Event Date/Time   First MD Initiated Contact with Patient 02/21/23 0221     (approximate)   History   Shortness of Breath   HPI  Christine Serrano is a 62 y.o. female who presents to the ED for evaluation of Shortness of Breath   Review of medical DC summary from 9/1.  Severe/end-stage COPD chronic 4 L, still smoking.  Returned home with a roommate and when I reviewed telephone encounters since then, looks like hospice is trying to establish with the patient but has not yet done so.  Patient presents to the ED for evaluation of shortness of breath, anxiety and feeling unwell.  She reports that she felt like she was going to die at home and felt terrible, got "worked up" and called 911.  Lives at home with a roommate, Marchelle Folks.  EMS found her hypotensive, hypoxic, pinpoint pupils and new onset A-fib with RVR.  They provided Narcan with improvement of consciousness, IV fluids, breathing treatment and steroids with improvement of her blood pressure.  Here in the ED she was reporting fatigue and shortness of breath   Physical Exam   Triage Vital Signs: ED Triage Vitals  Encounter Vitals Group     BP --      Systolic BP Percentile --      Diastolic BP Percentile --      Pulse --      Resp --      Temp --      Temp src --      SpO2 --      Weight 02/21/23 0219 83 lb 12.4 oz (38 kg)     Height 02/21/23 0219 5\' 5"  (1.651 m)     Head Circumference --      Peak Flow --      Pain Score 02/21/23 0218 4     Pain Loc --      Pain Education --      Exclude from Growth Chart --     Most recent vital signs: Vitals:   02/21/23 0455 02/21/23 0505  BP: 98/65 (!) 97/58  Pulse: (!) 118 (!) 121  Resp: 18 15  Temp:    SpO2: 93% 96%    General: Awake, no distress.  Frail, thin and chronically ill-appearing CV:  Good peripheral perfusion.  Tachycardic and irregular Resp:  Tachypnea and wheezing throughout Abd:  No distention.  soft MSK:  No deformity noted.  Neuro:  No focal deficits appreciated. Other:     ED Results / Procedures / Treatments   Labs (all labs ordered are listed, but only abnormal results are displayed) Labs Reviewed  COMPREHENSIVE METABOLIC PANEL - Abnormal; Notable for the following components:      Result Value   Chloride 94 (*)    CO2 39 (*)    Glucose, Bld 165 (*)    Total Protein 6.0 (*)    Albumin 3.2 (*)    Total Bilirubin 0.1 (*)    All other components within normal limits  CBC WITH DIFFERENTIAL/PLATELET - Abnormal; Notable for the following components:   RBC 3.34 (*)    Hemoglobin 9.4 (*)    HCT 31.7 (*)    MCHC 29.7 (*)    All other components within normal limits  BRAIN NATRIURETIC PEPTIDE - Abnormal; Notable for the following components:   B Natriuretic Peptide 424.2 (*)    All other components within normal limits  CBG MONITORING, ED -  Abnormal; Notable for the following components:   Glucose-Capillary 137 (*)    All other components within normal limits  SARS CORONAVIRUS 2 BY RT PCR  CULTURE, BLOOD (ROUTINE X 2)  CULTURE, BLOOD (ROUTINE X 2)  LACTIC ACID, PLASMA  PROTIME-INR  APTT  PROCALCITONIN  LACTIC ACID, PLASMA  URINALYSIS, ROUTINE W REFLEX MICROSCOPIC  TROPONIN I (HIGH SENSITIVITY)  TROPONIN I (HIGH SENSITIVITY)    EKG A-fib with RVR, rate 131 bpm.  Normal axis and intervals.  No STEMI. Repeat demonstrates a sinus tachycardia with rate of 120 bpm.  Normal axis and intervals.  No STEMI.  RADIOLOGY 1 view CXR interpreted by me with patchy bilateral opacities  Official radiology report(s): DG Chest Port 1 View  Result Date: 02/21/2023 CLINICAL DATA:  Sepsis these results were communicated via [email or web address] application on 02/21/2023 at 2:56 am. EXAM: PORTABLE CHEST 1 VIEW COMPARISON:  CT chest 11/12/2022, chest x-ray 02/14/2023 FINDINGS: The heart and mediastinal contours are within normal limits. Hyperinflation of the lungs. Persistent bilateral upper lobe  and right lower lobe patchy airspace opacities. Chronic coarsened interstitial markings with no overt pulmonary edema. No pleural effusion. No pneumothorax. No acute osseous abnormality. IMPRESSION: 1. Persistent bilateral upper lobe and right lower lobe patchy airspace opacities. Finding could represent multifocal pneumonia with underlying malignancy not excluded. Recommend PET-CT as noted on CT angio chest 11/12/2022. 2. Aortic Atherosclerosis (ICD10-I70.0) and Emphysema (ICD10-J43.9). Electronically Signed   By: Tish Frederickson M.D.   On: 02/21/2023 03:02    PROCEDURES and INTERVENTIONS:  .1-3 Lead EKG Interpretation  Performed by: Delton Prairie, MD Authorized by: Delton Prairie, MD     Interpretation: abnormal     ECG rate:  131   ECG rate assessment: tachycardic     Rhythm: atrial fibrillation     Ectopy: none     Conduction: normal   .Sedation  Date/Time: 02/21/2023 5:20 AM  Performed by: Delton Prairie, MD Authorized by: Delton Prairie, MD   Consent:    Consent obtained:  Verbal and emergent situation   Consent given by:  Patient Universal protocol:    Immediately prior to procedure, a time out was called: yes   Pre-sedation assessment:    Time since last food or drink:  4hr   ASA classification: class 4 - patient with severe systemic disease that is a constant threat to life     Mallampati score:  II - soft palate, uvula, fauces visible   Pre-sedation assessments completed and reviewed: pre-procedure airway patency not reviewed   Immediate pre-procedure details:    Reassessment: Patient reassessed immediately prior to procedure     Reviewed: vital signs, relevant labs/tests and NPO status     Verified: bag valve mask available, emergency equipment available, intubation equipment available, IV patency confirmed, oxygen available, reversal medications available and suction available   Procedure details (see MAR for exact dosages):    Preoxygenation:  Nasal cannula   Sedation:   Etomidate   Intended level of sedation: deep   Analgesia:  Fentanyl   Intra-procedure monitoring:  Blood pressure monitoring, cardiac monitor, continuous pulse oximetry, continuous capnometry, frequent LOC assessments and frequent vital sign checks   Intra-procedure events: hypoxia     Intra-procedure management:  Airway repositioning and supplemental oxygen   Reversal agents:  Naloxone   Total Provider sedation time (minutes):  15 Post-procedure details:    Attendance: Constant attendance by certified staff until patient recovered     Recovery: Patient returned to pre-procedure baseline  Patient is stable for discharge or admission: yes     Procedure completion:  Tolerated well, no immediate complications .Cardioversion  Date/Time: 02/21/2023 5:21 AM  Performed by: Delton Prairie, MD Authorized by: Delton Prairie, MD   Consent:    Consent obtained:  Verbal and emergent situation   Consent given by:  Patient Pre-procedure details:    Cardioversion basis:  Emergent   Rhythm:  Atrial fibrillation   Electrode placement:  Anterior-posterior Patient sedated: Yes. Refer to sedation procedure documentation for details of sedation.  Attempt one:    Cardioversion mode:  Synchronous   Shock (Joules):  200   Shock outcome:  Conversion to normal sinus rhythm Post-procedure details:    Patient status:  Awake   Patient tolerance of procedure:  Tolerated well, no immediate complications .Critical Care  Performed by: Delton Prairie, MD Authorized by: Delton Prairie, MD   Critical care provider statement:    Critical care time (minutes):  75   Critical care time was exclusive of:  Separately billable procedures and treating other patients   Critical care was necessary to treat or prevent imminent or life-threatening deterioration of the following conditions:  Cardiac failure, circulatory failure and respiratory failure   Critical care was time spent personally by me on the following activities:   Development of treatment plan with patient or surrogate, discussions with consultants, evaluation of patient's response to treatment, examination of patient, ordering and review of laboratory studies, ordering and review of radiographic studies, ordering and performing treatments and interventions, pulse oximetry, re-evaluation of patient's condition and review of old charts   Medications  0.9 %  sodium chloride infusion (0 mLs Intravenous Stopped 02/21/23 0349)  ceFEPIme (MAXIPIME) 2 g in sodium chloride 0.9 % 100 mL IVPB (0 g Intravenous Stopped 02/21/23 0413)  vancomycin (VANCOCIN) IVPB 1000 mg/200 mL premix (0 mg Intravenous Stopped 02/21/23 0515)  fentaNYL (SUBLIMAZE) injection 50 mcg (50 mcg Intravenous Given 02/21/23 0343)  etomidate (AMIDATE) injection 4 mg (4 mg Intravenous Given 02/21/23 0343)  lactated ringers bolus 1,000 mL (0 mLs Intravenous Stopped 02/21/23 0413)  naloxone (NARCAN) injection 0.1 mg (0.1 mg Intravenous Given 02/21/23 0426)     IMPRESSION / MDM / ASSESSMENT AND PLAN / ED COURSE  I reviewed the triage vital signs and the nursing notes.  Differential diagnosis includes, but is not limited to, sepsis, pneumonia, PE, ACS, pneumothorax  {Patient presents with symptoms of an acute illness or injury that is potentially life-threatening.  Patient presents from home with new onset A-fib, hypotensive requiring emergent cardioversion for return to a sinus rhythm.  Subsequently with evidence of a COPD exacerbation, multifocal pneumonia and sepsis.  She has not yet set up hospice at home, but is interested in doing so and it looks like [facility] has started that process.  She expressed discomfort to being at home with just her roommates and not yet coordinated with hospice.   Her blood work is fairly reassuring with WBC, negative troponins, negative procalcitonin and generally normal metabolic panel.  Clinical Course as of 02/21/23 0528  Caleen Essex Feb 21, 2023  0228 Reassessed and had a  conversation with the patient regarding cardioversion considering her A-fib with RVR and poor blood pressure.  She is awake, alert and is agreeable for this procedure but we settled on trying a fluid bolus and reassessing in just a couple minutes considering how well she looks and has minimal symptoms. [DS]  0235 Bp improving. Low temp. Starting bair hugger [DS]  [phone] Reassessed BP  stable and remains in A-fib with RVR [DS]  0326 Reassessed.  BP is now waning again to the low to mid 60s and remains in A-fib with RVR.  She reports feeling sleepy and her breathing is better.  I discussed with her synchronized cardioversion, plan of care, CODE STATUS.   She does not want to be hospitalized and would like to go home.  I discussed my discomfort sending her home in the situation.  We discussed options.  After discussing risks and benefits, she is agreeable to pursue synchronized cardioversion, antibiotics and reassess [DS]  0350 Cardioverted once at 200 J and return to a sinus rhythm, sinus tachycardia the rate of 121 bpm.  BP improved with maps in the 70s.  Transiently required BVM due to desaturation [DS]  0408 Reassessed and discussed plan of care [DS]  0428 Had to give a small amount of narcan due to hypoxia. 0.1mg  IV, improved respirations. Remains on nasal cannula [DS]  [phone] Consult with medicine who agrees to admit [DS]    Clinical Course User Index [DS] Delton Prairie, MD     FINAL CLINICAL IMPRESSION(S) / ED DIAGNOSES   Final diagnoses:  New onset atrial fibrillation (HCC)  COPD exacerbation (HCC)     Rx / DC Orders   ED Discharge Orders     None        Note:  This document was prepared using Dragon voice recognition software and may include unintentional dictation errors.   Delton Prairie, MD 02/21/23 0530

## 2023-02-21 NOTE — Progress Notes (Signed)
[facility] Liaison Note:  This patient is a current [facility] patient.  Patient was scheduled to transition to Hospice services with [facility] on Sunday, 02/23/23.  Palliative Care and Hospice services at [facility] notified that patient is currently hospitalized.  Paitent's POA and roommate, Christine Serrano, stated that she wants patient to return home when medically stable for discharge.  She said that patient may need a hospital bed, but would rather wait for the Hospice nurse to assess for any DME needs after she returns home from the hospital.    HL will continue to follow during hospital stay.  Please call with any Hospice related questions or concerns.  [facility] Liaison [phone]

## 2023-02-21 NOTE — H&P (Signed)
NAME:  Christine Serrano, MRN:  [id number], DOB:  June 21, 1960, LOS: 0 ADMISSION DATE:  02/21/2023, CONSULTATION DATE:  02/21/2023 REFERRING MD:  Dr. Katrinka Blazing, CHIEF COMPLAINT:  Shortness of Breath   Brief Pt Description / Synopsis:  62 y.o. female with PMHx significant for End Stage COPD on 4 L supplemental Oxygen at baseline, admitted with Acute on Chronic Hypoxic & Hypercapnic Respiratory Failure due to suspected AECOPD, along with Acute Metabolic Encephalopathy due to unintentional Opioid overdose & UTI.  History of Present Illness:  62 yo F presenting to [facility] ED from home via EMS for evaluation of shortness of breath.   History obtained per chart review as patient is too altered to participate in bedside interview at this time. Patient unable to answer any questions regarding how she has been feeling at home.  Only able to answer, " I do not know".  Upon arrival after receiving Narcan via EMS she told EDP she felt like she was going to die at home and felt terrible got " worked up" and called 911.  She currently lives with her roommate Marchelle Folks.   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  After returning home via telephone encounters it appears [facility] is trying to establish care with the patient through her primary care provider.   Spoke with roommate and POA Amanda via telephone.  She reports that over the past day or two, the patient has been more confused and not resting well.  She administered the prescribed prn morphine which seemed to help the patient  rest, however the patient called her overnight while she was at work panicking saying she thought she was going to die.  She also reports it is possible that the patient could have taken the prn morphine accidentally above what was prescribed.  EMS found the patient hypotensive, hypoxic with pinpoint pimples and new onset A-fib with RVR.  They administered Narcan with  improvement of consciousness as well as IV fluids DuoNebs and steroids with improvement in blood pressure.   ED course: Upon arrival reporting fatigue and shortness of breath and new onset A-fib RVR with hypotension.  Patient hypothermic, tachypneic, tachycardic and hypotensive. Due to A-fib RVR emergent cardioversion was discussed with the patient while she was awake and alert and she was agreeable to the procedure.  The patient was given sedation requiring emergent cardioversion with return to sinus rhythm.  Labs significant for elevated BNP, low chloride and elevated serum bicarb.  Imaging concerning for multifocal pneumonia versus underlying malignancy. After cardioversion patient hypoxic with respiratory depression given Narcan again with positive impact on mentation, sepsis protocol initiated with IV fluid resuscitation and empiric antibiotics. However due to hypotension after IV fluid resuscitation PCCM consulted for peripheral vasopressor support . medications given: Etomidate, fentanyl, Narcan, 2 L IV fluid bolus, cefepime/vancomycin Initial Vitals: 95.8, 20, 135, 88/64 and 100% on 4 L nasal cannula Significant labs: (Labs/ Imaging personally reviewed) Chemistry: Na+: 140, K+: 3.7, BUN/Cr.:  15/0.46, Serum CO2/ AG: 39/7, Cl: 94 Hematology: WBC: 9.5, Hgb: 9.4,  Troponin: 16 > 16, BNP: 424.2, Lactic/ PCT: 1.1> 0.5/ <0.10, COVID-19: Negative VBG: 7.24 / 88 / 52 / 37.7 CXR 02/21/23: IMPRESSION: 1. Persistent bilateral upper lobe and right lower lobe patchy airspace opacities. Finding could represent multifocal pneumonia with underlying malignancy not excluded. Recommend PET-CT as noted on CT angio chest 11/12/2022. 2. Aortic Atherosclerosis (ICD10-I70.0) and Emphysema (ICD10-J43.9) CT Head: IMPRESSION: 1. No  evidence of an acute intracranial abnormality. 2. Generalized cerebral atrophy.  She was ultimately placed on Narcan drip.     PCCM asked to admit for further workup and treatment.   Please see "Significant Hospital Events" section below for full detailed hospital course.   Pertinent  Medical History   Past Medical History:  Diagnosis Date   Cervical cancer (HCC)    Chronic idiopathic constipation    Community acquired pneumonia of right upper lobe of lung 12/10/2016   COPD (chronic obstructive pulmonary disease) (HCC)    Depression    Endometriosis    HLD (hyperlipidemia)    Smoker      Micro Data:  9/6: COVID-19 PCR>> negative 9/6: Respiratory viral panel>> 9/6: Blood culture x2>> 9/6: MRSA PCR>>negative  Antimicrobials:   Anti-infectives (From admission, onward)    Start     Dose/Rate Route Frequency Ordered Stop   02/21/23 0315  ceFEPIme (MAXIPIME) 2 g in sodium chloride 0.9 % 100 mL IVPB        2 g 200 mL/hr over 30 Minutes Intravenous  Once 02/21/23 0313 02/21/23 0413   02/21/23 0315  vancomycin (VANCOCIN) IVPB 1000 mg/200 mL premix        1,000 mg 200 mL/hr over 60 Minutes Intravenous  Once 02/21/23 0313 02/21/23 0515        Significant Hospital Events: Including procedures, antibiotic start and stop dates in addition to other pertinent events   9/6: Presented to ED with Shortness of breath, required Cardioversion in ED for A. Fib w/ RVR.  Concern for narcotic overdose, requiring Narcan gtt.  PCCM asked to admit.  Interim History / Subjective:  -Pt seen in ICU, is awake and alert on Narcan gtt, is oriented only to person and place ~ is a very poor historian and unable to reliably contribute to history  -Hemodyncamically stable, on 5 L [place] (on 4 L at baseline), vasopressors have been weaned off  Objective   Blood pressure 106/65, pulse (!) 111, temperature 97.8 F (36.6 C), temperature source Oral, resp. rate (!) 25, height 5\' 5"  (1.651 m), weight 45.3 kg, SpO2 97%.        Intake/Output Summary (Last 24 hours) at 02/21/2023 0943 Last data filed at 02/21/2023 0900 Gross per 24 hour  Intake 225.03 ml  Output --  Net 225.03 ml   Filed  Weights   02/21/23 0219 02/21/23 0830  Weight: 38 kg 45.3 kg    Examination: General: Acute on chronically ill-appearing cachectic female, sitting in bed, on 5 L nasal cannula, awake and on Narcan drip, no acute distress HENT: Atraumatic, normocephalic, neck supple, no JVD Lungs: Inspiratory and expiratory wheezing throughout, even, nonlabored Cardiovascular: Tachycardia, regular rhythm, S1-S2, no murmurs, rubs, gallops Abdomen: Soft, nontender, nondistended, no guarding or rebound tenderness, bowel sound positive for Extremities: No deformities, normal bulk and tone, 1+ edema bilateral lower extremities Neuro: Awake and alert on Narcan drip, disoriented to time and situation.  Moves all extremities to command, no focal deficits, speech clear, pupils PERRLA GU: Deferred  Resolved Hospital Problem list     Assessment & Plan:   #Acute on Chronic Hypoxic & Hypercapnic Respiratory Failure due to End Stage COPD & Suspected Opioid Overdose -Supplemental O2 as needed to maintain O2 sats 88 to 92% -BiPAP if needed (pt is DNR/DNI) -Follow intermittent Chest X-ray & ABG as needed -Bronchodilators & Pulmicort nebs -IV Steroids -Diuresis as BP and renal function permits ~ holding due to soft BP -Pulmonary toilet as able -Consult  Palliative Care (pt recently discharged from [facility], plan was for hospice at home)  #Hypotension: Cardiogenic vs Hypovolemic vs Septic ~ RESOLVED #New onset Atrial Fibrillation with RVR ~ s/p Cardioversion to NSR/ST in ED #Chronic HFmrEF Echocardiogram 11/12/22:  LVEF 40-45%, indeterminate diastolic parameters, normal RV systolic function, mild to moderate TR -Continuous cardiac monitoring -Maintain MAP >65 -Cautious IV fluids (received 2L IV fluid boluses in ED for sepsis) -Vasopressors as needed to maintain MAP goal ~ weaned off -Lactic acid has normalized -HS Troponin negative x2 -Repeat Echocardiogram pending -Diuresis as BP and renal function permits ~  hold due to soft BP  #Sepsis due to Urinary Tract Infection (Met SIRS Criteria upon presentation: Temperature 95.8, HR >90) -Monitor fever curve -Trend WBC's & Procalcitonin -Follow cultures as above -Continue empiric Ceftriaxone pending cultures & sensitivities  #Acute Metabolic Encephalopathy #Suspected Opioid Overdose (unintentional) CT Head negative for acute intracranial abnormality UDS + for opiates -Treatment of metabolic derangements & UTI as outlined above -Provide supportive care -Promote normal sleep/wake cycle and family presence -Avoid sedating medications as able -Continue Narcan gtt ~ wean as able    Pt with End Stage COPD on baseline 4L supplemental poor O2, poor PO intake and cachetic.  Overall long term prognosis is extremely poor.  Pt is DNR/DNI.  Recommend consideration for comfort measures.  Palliative Care consulted.   Best Practice (right click and "Reselect all SmartList Selections" daily)   Diet/type: Regular consistency (see orders) DVT prophylaxis: LMWH GI prophylaxis: N/A Lines: N/A Foley:  N/A Code Status:  DNR Last date of multidisciplinary goals of care discussion [9/6]  9/6: Pt updated at bedside.  Updated pt's roommate and POA [name] via telephone.  Labs   CBC: Recent Labs  Lab 02/14/23 1850 02/21/23 0230  WBC 7.6 9.5  NEUTROABS 6.5 4.4  HGB 8.3* 9.4*  HCT 27.6* 31.7*  MCV 93.2 94.9  PLT 168 212    Basic Metabolic Panel: Recent Labs  Lab 02/14/23 1850 02/21/23 0230  NA 136 140  K 3.4* 3.7  CL 89* 94*  CO2 38* 39*  GLUCOSE 133* 165*  BUN 9 15  CREATININE 0.43* 0.46  CALCIUM 9.0 9.0   GFR: Estimated Creatinine Clearance: 50.8 mL/min (by C-G formula based on SCr of 0.46 mg/dL). Recent Labs  Lab 02/14/23 1850 02/14/23 1857 02/21/23 0227 02/21/23 0230 02/21/23 0503  PROCALCITON  --   --   --  <0.10  --   WBC 7.6  --   --  9.5  --   LATICACIDVEN  --  0.5 1.1  --  0.5    Liver Function Tests: Recent Labs   Lab 02/14/23 1850 02/21/23 0230  AST 19 21  ALT 11 14  ALKPHOS 68 62  BILITOT 0.2* 0.1*  PROT 6.0* 6.0*  ALBUMIN 3.3* 3.2*   No results for input(s): "LIPASE", "AMYLASE" in the last 168 hours. No results for input(s): "AMMONIA" in the last 168 hours.  ABG    Component Value Date/Time   PHART 7.371 05/16/2019 0205   PCO2ART 52.1 (H) 05/16/2019 0205   PO2ART 192 (H) 05/16/2019 0205   HCO3 37.7 (H) 02/21/2023 0815   TCO2 25.9 04/04/2009 1430   O2SAT 85.5 02/21/2023 0815     Coagulation Profile: Recent Labs  Lab 02/21/23 0230  INR 0.9    Cardiac Enzymes: No results for input(s): "CKTOTAL", "CKMB", "CKMBINDEX", "TROPONINI" in the last 168 hours.  HbA1C: No results found for: "HGBA1C"  CBG: Recent Labs  Lab 02/21/23  0246 02/21/23 0829  GLUCAP 137* 130*    Review of Systems:   Unable to assess due to AMS   Past Medical History:  She,  has a past medical history of Cervical cancer (HCC), Chronic idiopathic constipation, Community acquired pneumonia of right upper lobe of lung (12/10/2016), COPD (chronic obstructive pulmonary disease) (HCC), Depression, Endometriosis, HLD (hyperlipidemia), and Smoker.   Surgical History:   Past Surgical History:  Procedure Laterality Date   ABLATION ON ENDOMETRIOSIS     APPENDECTOMY     BREAST EXCISIONAL BIOPSY Left age 37   benign biopsy - chronic fatty deposit   COLONOSCOPY WITH PROPOFOL N/A 08/04/2017   TAx3, angiodysplastic lesion, diverticulosis, rpt 5 yrs ([facility])   EXPLORATORY LAPAROTOMY  1992   endometriosis   FOOT SURGERY Right    needle imbeded   LASER ABLATION CONDYLOMA CERVICAL / VULVAR     NASAL SEPTUM SURGERY Bilateral    OVARIAN CYST SURGERY Bilateral 1980   remote   SALPINGOOPHORECTOMY Right 1983   ectopic pregnancy   TONSILLECTOMY       Social History:   reports that she has been smoking cigarettes. She has a 51 pack-year smoking history. She has never used smokeless tobacco. She reports current  alcohol use. She reports that she does not use drugs.   Family History:  Her family history includes COPD in her father; Colon cancer in her paternal grandmother; Hearing loss in her maternal grandfather and maternal grandmother; Heart disease in her mother; Hypertension in her father, maternal grandfather, maternal grandmother, and mother.   Allergies Allergies  Allergen Reactions   Terbinafine And Related Diarrhea    Diarrheal illness while on terbinafine ?related to med   Clarithromycin     Caused Thrush    Imitrex [Sumatriptan] Other (See Comments)    Worsens migraine      Home Medications  Prior to Admission medications   Medication Sig Start Date End Date Taking? Authorizing Provider  albuterol (PROVENTIL) (2.5 MG/3ML) 0.083% nebulizer solution INHALE CONTENTS OF 1 VIAL VIA NEBULIZER EVERY 6 HOURS AS NEEDED FOR WHEEZE OR SHORTNESS OF BREATH Patient taking differently: Take 2.5 mg by nebulization every 6 (six) hours as needed. 08/28/22   Salena Saner, MD  albuterol (VENTOLIN HFA) 108 (90 Base) MCG/ACT inhaler Inhale 1-2 puffs into the lungs every 6 (six) hours as needed for wheezing or shortness of breath. 02/04/23   Eustaquio Boyden, MD  benzonatate (TESSALON) 100 MG capsule Take 1 capsule (100 mg total) by mouth 3 (three) times daily as needed for cough. 01/06/23   Eustaquio Boyden, MD  BREZTRI AEROSPHERE 160-9-4.8 MCG/ACT AERO INHALE 2 PUFFS INTO THE LUNGS IN THE MORNING AND AT BEDTIME. 08/12/22   Salena Saner, MD  buPROPion (WELLBUTRIN XL) 150 MG 24 hr tablet Take 1 tablet (150 mg total) by mouth daily. 02/04/23   Eustaquio Boyden, MD  cholecalciferol (VITAMIN D3) 25 MCG (1000 UNIT) tablet Take 1,000 Units by mouth daily.    [provider]  fluticasone (FLONASE) 50 MCG/ACT nasal spray Place 2 sprays into both nostrils daily. 06/06/20   Eustaquio Boyden, MD  folic acid (FOLVITE) 1 MG tablet Take 1 tablet (1 mg total) by mouth daily. 12/12/21   Eustaquio Boyden,  MD  gabapentin (NEURONTIN) 100 MG capsule Take 1 capsule (100 mg total) by mouth 2 (two) times daily. 02/04/23   Eustaquio Boyden, MD  glycopyrrolate (ROBINUL) 1 MG tablet Take 1 tablet (1 mg total) by mouth every 4 (four) hours as needed (  excessive secretions). 02/15/23   Willeen Niece, MD  guaiFENesin (MUCINEX) 600 MG 12 hr tablet Take 1 tablet (600 mg total) by mouth 2 (two) times daily. 11/14/22   Glade Lloyd, MD  hydrOXYzine (ATARAX) 25 MG tablet Take 1 tablet (25 mg total) by mouth 2 (two) times daily as needed for anxiety. 11/07/22   Eustaquio Boyden, MD  Iron, Ferrous Sulfate, 325 (65 Fe) MG TABS Take 325 mg by mouth every other day. 02/12/23   Eustaquio Boyden, MD  LINZESS 290 MCG CAPS capsule Take 1 capsule (290 mcg total) by mouth daily as needed (constipation). 02/04/23   Eustaquio Boyden, MD  loratadine (CLARITIN) 10 MG tablet TAKE 1 TABLET BY MOUTH EVERY DAY AS NEEDED Patient taking differently: Take 10 mg by mouth daily as needed for allergies. 09/16/22   Eustaquio Boyden, MD  LORazepam (ATIVAN) 0.5 MG tablet Take 1 tablet (0.5 mg total) by mouth every 6 (six) hours as needed for anxiety or sleep. 02/04/23   Eustaquio Boyden, MD  meclizine (ANTIVERT) 25 MG tablet Take 1 tablet (25 mg total) by mouth 3 (three) times daily as needed for dizziness. 02/04/23   Eustaquio Boyden, MD  methocarbamol (ROBAXIN) 500 MG tablet Take 1 tablet (500 mg total) by mouth 3 (three) times daily between meals as needed for muscle spasms. 02/04/23   Eustaquio Boyden, MD  Morphine Sulfate (MORPHINE CONCENTRATE) 10 MG/0.5ML SOLN concentrated solution Place 0.25 mLs (5 mg total) under the tongue every 3 (three) hours as needed for severe pain, moderate pain or shortness of breath. 11/14/22   Glade Lloyd, MD  Multiple Vitamin (MULTIVITAMIN WITH MINERALS) TABS tablet Take 1 tablet by mouth daily. 09/12/19   Glade Lloyd, MD  naproxen (NAPROSYN) 375 MG tablet TAKE 1 TABLET BY MOUTH TWICE  DAILY AS NEEDED FOR  PAIN WITH  FOOD 01/09/23   Eustaquio Boyden, MD  omeprazole (PRILOSEC) 40 MG capsule TAKE 1 CAPSULE BY MOUTH DAILY. FOR THREE WEEKS THEN AS NEEDED. TAKE 30 MIN PRIOR TO LARGE MEAL 02/04/23   Eustaquio Boyden, MD  OXYGEN Inhale 4 L/min into the lungs continuous.     [provider]  Potassium 99 MG TABS Take 99 mg by mouth daily.    [provider]  PROLIA 60 MG/ML SOSY injection Inject 60 mg into the skin every 6 (six) months. 08/06/22   [provider]  sertraline (ZOLOFT) 100 MG tablet Take 1.5 tablets (150 mg total) by mouth daily. 02/04/23   Eustaquio Boyden, MD  thiamine 100 MG tablet Take 1 tablet (100 mg total) by mouth daily. 09/12/19   Glade Lloyd, MD  traZODone (DESYREL) 50 MG tablet TAKE 1 TABLET BY MOUTH EVERYDAY AT BEDTIME 02/04/23   Eustaquio Boyden, MD  vitamin B-12 (CYANOCOBALAMIN) 1000 MCG tablet Take 1 tablet (1,000 mcg total) by mouth daily. 06/06/20   Eustaquio Boyden, MD     Critical care time: 55 minutes     Harlon Ditty, AGACNP-BC [facility] Prefer epic messenger for cross cover needs If after hours, please call E-link

## 2023-02-21 NOTE — ED Triage Notes (Signed)
Pt BIB [facility] from home with c/o shob, was found without oxygen on, pt normally wears 4lpm [place] chroninc. En route pt's bp dropped to 88/50s, pin point pupils, and unresponsive. Pt takes morphine at home, ? Unknown amount that she took. Pt recently discharged from [facility] with end stage lung cancer, waiting on hospice to make contact. Pt currently A&O x4.  X 1 breathing tx 125mg  solumedrol NS 0.5 Narcan

## 2023-02-21 NOTE — ED Notes (Signed)
Bear hugger applied 

## 2023-02-21 NOTE — TOC Initial Note (Signed)
Transition of Care [facility]) - Initial/Assessment Note    Patient Details  Name: Christine Serrano MRN: [id number] Date of Birth: 1961/01/02  Transition of [facility]) CM/SW Contact:    Kreg Shropshire, RN Phone Number: 02/21/2023, 1:14 PM  Clinical Narrative:                  Cm spoke with pt at the bedside. Cm also called roommate and caregiver Marchelle Folks. Received clarification that [facility] Liaison Florestine Avers that nurse was scheduled to come to home on 02/23/23 but will have to reschedule nurse visit now that pt has admitted.   Pt arrived from ED from: Home Caregiver Support: Lives with roommate Chipper Oman who stated "I am there with pt 90%) of the time. Cm asked if there was any other help in the home and Marchelle Folks stated no. Chipper Oman goes out for work a few hours. Marchelle Folks stated she has cameras in the house as well DME at Home: [facility] has not came and set up equipment yet Transportation: Marchelle Folks Previous Services: Hospice scheduled HH/SNF Preference: Geologist, engineering First Person of Contact: Cytogeneticist [id number], Wynona Canes, MD   Cm will continue to follow for toc needs and d/c planning.        Patient Goals and CMS Choice   CMS Medicare.gov Compare Post Acute Care list provided to:: Patient Choice offered to / list presented to : Patient      Expected Discharge Plan and Services     Post Acute Care Choice: Hospice Living arrangements for the past 2 months: Single Family Home                                      Prior Living Arrangements/Services Living arrangements for the past 2 months: Single Family Home Lives with:: Roommate          Need for Family Participation in Patient Care: Yes (Comment) Care giver support system in place?: Yes (comment) Current home services: DME    Activities of Daily Living Home Assistive Devices/Equipment: Oxygen ADL Screening (condition at time of admission) Patient's cognitive ability adequate to safely complete daily  activities?: Yes Is the patient deaf or have difficulty hearing?: No Does the patient have difficulty seeing, even when wearing glasses/contacts?: No Does the patient have difficulty concentrating, remembering, or making decisions?: No Patient able to express need for assistance with ADLs?: Yes Does the patient have difficulty dressing or bathing?: No Independently performs ADLs?: Yes (appropriate for developmental age) Does the patient have difficulty walking or climbing stairs?: Yes Weakness of Legs: Both Weakness of Arms/Hands: None  Permission Sought/Granted                  Emotional Assessment     Affect (typically observed): Accepting Orientation: : Oriented to Self, Oriented to Situation, Fluctuating Orientation (Suspected and/or reported Sundowners)      Admission diagnosis:  New onset atrial fibrillation (HCC) [I48.91] COPD exacerbation (HCC) [J44.1] Atrial fibrillation with RVR (HCC) [I48.91] Patient Active Problem List   Diagnosis Date Noted   Atrial fibrillation with RVR (HCC) 02/21/2023   End of life care 02/14/2023   Lung mass 11/18/2022   Left leg swelling 07/23/2022   Right lower quadrant abdominal pain 11/29/2021   Unintentional weight loss 11/29/2021   Protein-calorie malnutrition (HCC) 11/29/2021   Arm paresthesia, right 08/31/2021   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   Arm injury, right, initial  encounter 10/24/2020   Fall with injury 10/24/2020   Dyspnea on exertion 08/23/2020   Low serum vitamin B12 06/09/2020   Cervical neck pain with evidence of disc disease 01/29/2020   Palliative care status 10/23/2019   Impaired mobility 10/20/2019   Chronic respiratory failure with hypoxia, on home O2 therapy (HCC) - 2 L/min 10/20/2019   Memory difficulties 10/16/2019   Folate deficiency 09/17/2019   Dizziness 09/11/2019   IDA (iron deficiency anemia) 09/11/2019   Hypocalcemia 09/11/2019   Systolic murmur 02/27/2019   Vertebral artery  stenosis, left 02/27/2019   Medicare annual wellness visit, subsequent 02/25/2019   Advanced directives, counseling/discussion 02/25/2019   COPD exacerbation (HCC) 09/02/2018   Osteoporosis 05/23/2018   Numbness of right hand 10/10/2017   Positive hepatitis C antibody test 02/23/2017   Encounter for general adult medical examination with abnormal findings 02/21/2017   HLD (hyperlipidemia) 02/21/2017   Generalized anxiety disorder with panic attacks 12/10/2016   Tobacco abuse 11/28/2016   Aortic atherosclerosis (HCC) 11/26/2016   Tachycardia 11/01/2016   MDD (major depressive disorder), recurrent episode, severe (HCC) 07/03/2016   Chronic constipation 07/03/2016   Alcohol use 07/03/2016   End stage COPD (HCC) 07/03/2016   Tremor 07/03/2016   Chronic insomnia 07/03/2016   PCP:  Eustaquio Boyden, MD Pharmacy:   [facility] - [place], [place] - [address] [place] [address] Phone: [phone] Fax: [phone]  [id number] - [address] [place] - [address] [place] [address] Phone: [phone] Fax: [phone]  [facility] [place], [place] - [address] [place] [address] Phone: [phone] Fax: [phone]  [facility] - [place], [place] - [address] [place] [address] Phone: [phone] Fax: [phone]  [facility] [place], [place] - [address] [place] [address] Phone: [phone] Fax: [phone]  [facility] - [address] [place] - [address] [place] [address] Phone: [phone] Fax: [phone]     Social Determinants of Health (SDOH) Social History: SDOH Screenings   Food Insecurity: No Food Insecurity (02/21/2023)  Housing: Low Risk  (02/21/2023)  Transportation Needs: No  Transportation Needs (02/21/2023)  Utilities: Not At Risk (02/21/2023)  Alcohol Screen: Low Risk  (10/01/2022)  Depression (PHQ2-9): High Risk (02/04/2023)  Financial Resource Strain: Low Risk  (10/01/2022)  Physical Activity: Inactive (10/01/2022)  Social Connections: Socially Isolated (10/01/2022)  Stress: Stress Concern Present (10/01/2022)  Tobacco Use: High Risk (02/21/2023)   SDOH Interventions:     Readmission Risk Interventions    02/15/2023    5:23 PM 11/14/2022   10:20 AM  Readmission Risk Prevention Plan  Transportation Screening Complete Complete  PCP or Specialist Appt within 5-7 Days Complete   Home Care Screening Complete Complete  Medication Review (RN CM) Complete

## 2023-02-21 NOTE — Progress Notes (Signed)
Received report from [place]. Patient is awake,alert, and following commands asking for dinner. Narcan infusion running.

## 2023-02-21 NOTE — Plan of Care (Signed)
  Problem: Coping: Goal: Ability to identify and develop effective coping behavior will improve Outcome: Not Progressing   Problem: Clinical Measurements: Goal: Quality of life will improve Outcome: Not Progressing   Problem: Respiratory: Goal: Verbalizations of increased ease of respirations will increase Outcome: Not Progressing   Problem: Pain Management: Goal: Satisfaction with pain management regimen will improve Outcome: Not Progressing   Problem: Education: Goal: Knowledge of General Education information will improve Description: Including pain rating scale, medication(s)/side effects and non-pharmacologic comfort measures Outcome: Not Progressing   Problem: Health Behavior/Discharge Planning: Goal: Ability to manage health-related needs will improve Outcome: Not Progressing   Problem: Clinical Measurements: Goal: Ability to maintain clinical measurements within normal limits will improve Outcome: Not Progressing Goal: Will remain free from infection Outcome: Not Progressing Goal: Diagnostic test results will improve Outcome: Not Progressing Goal: Respiratory complications will improve Outcome: Not Progressing Goal: Cardiovascular complication will be avoided Outcome: Not Progressing   Problem: Activity: Goal: Risk for activity intolerance will decrease Outcome: Not Progressing   Problem: Nutrition: Goal: Adequate nutrition will be maintained Outcome: Not Progressing   Problem: Coping: Goal: Level of anxiety will decrease Outcome: Not Progressing   Problem: Elimination: Goal: Will not experience complications related to bowel motility Outcome: Not Progressing Goal: Will not experience complications related to urinary retention Outcome: Not Progressing   Problem: Pain Managment: Goal: General experience of comfort will improve Outcome: Not Progressing   Problem: Safety: Goal: Ability to remain free from injury will improve Outcome: Not Progressing    Problem: Skin Integrity: Goal: Risk for impaired skin integrity will decrease Outcome: Not Progressing

## 2023-02-22 ENCOUNTER — Inpatient Hospital Stay: Admit: 2023-02-22 | Payer: Medicare HMO

## 2023-02-22 ENCOUNTER — Inpatient Hospital Stay
Admit: 2023-02-22 | Discharge: 2023-02-22 | Disposition: A | Discharge status: Home / Self Care | Payer: Medicare HMO | Attending: Pulmonary Disease | Admitting: Pulmonary Disease

## 2023-02-22 ENCOUNTER — Other Ambulatory Visit: Payer: Medicare HMO

## 2023-02-22 DIAGNOSIS — Z720 Tobacco use: Secondary | ICD-10-CM | POA: Diagnosis not present

## 2023-02-22 DIAGNOSIS — F410 Panic disorder [episodic paroxysmal anxiety] without agoraphobia: Secondary | ICD-10-CM

## 2023-02-22 DIAGNOSIS — J9621 Acute and chronic respiratory failure with hypoxia: Secondary | ICD-10-CM

## 2023-02-22 DIAGNOSIS — Z789 Other specified health status: Secondary | ICD-10-CM | POA: Diagnosis not present

## 2023-02-22 DIAGNOSIS — F411 Generalized anxiety disorder: Secondary | ICD-10-CM

## 2023-02-22 DIAGNOSIS — I4891 Unspecified atrial fibrillation: Secondary | ICD-10-CM | POA: Diagnosis not present

## 2023-02-22 DIAGNOSIS — J9622 Acute and chronic respiratory failure with hypercapnia: Secondary | ICD-10-CM | POA: Diagnosis not present

## 2023-02-22 DIAGNOSIS — J441 Chronic obstructive pulmonary disease with (acute) exacerbation: Secondary | ICD-10-CM | POA: Diagnosis not present

## 2023-02-22 DIAGNOSIS — Z515 Encounter for palliative care: Secondary | ICD-10-CM | POA: Diagnosis not present

## 2023-02-22 LAB — BLOOD CULTURE ID PANEL (REFLEXED) - BCID2

## 2023-02-22 LAB — RENAL FUNCTION PANEL
Albumin: 2.5 g/dL — ABNORMAL LOW (ref 3.5–5.0)
Anion gap: 5 (ref 5–15)
BUN: 21 mg/dL (ref 8–23)
CO2: 39 mmol/L — ABNORMAL HIGH (ref 22–32)
Calcium: 8.9 mg/dL (ref 8.9–10.3)
Chloride: 97 mmol/L — ABNORMAL LOW (ref 98–111)
Creatinine, Ser: 0.37 mg/dL — ABNORMAL LOW (ref 0.44–1.00)
GFR, Estimated: >60 mL/min (ref >60)
Glucose, Bld: 146 mg/dL — ABNORMAL HIGH (ref 70–99)
Phosphorus: 3 mg/dL (ref 2.5–4.6)
Potassium: 3.7 mmol/L (ref 3.5–5.1)
Sodium: 141 mmol/L (ref 135–145)

## 2023-02-22 LAB — URINE CULTURE: Culture: <10000 — AB

## 2023-02-22 LAB — MAGNESIUM: Magnesium: 1.8 mg/dL (ref 1.7–2.4)

## 2023-02-22 LAB — CBC
HCT: 26.1 % — ABNORMAL LOW (ref 36.0–46.0)
Hemoglobin: 8 g/dL — ABNORMAL LOW (ref 12.0–15.0)
MCH: 28.4 pg (ref 26.0–34.0)
MCHC: 30.7 g/dL (ref 30.0–36.0)
MCV: 92.6 fL (ref 80.0–100.0)
Platelets: 194 10*3/uL (ref 150–400)
RBC: 2.82 MIL/uL — ABNORMAL LOW (ref 3.87–5.11)
RDW: 13 % (ref 11.5–15.5)
WBC: 7.9 10*3/uL (ref 4.0–10.5)
nRBC: 0 % (ref 0.0–0.2)

## 2023-02-22 LAB — PROCALCITONIN: Procalcitonin: <0.1 ng/mL

## 2023-02-22 MED ORDER — FOLIC ACID 1 MG PO TABS: 1.0000 mg | ORAL_TABLET | Freq: Every day | ORAL | Status: DC

## 2023-02-22 MED ORDER — LINACLOTIDE 290 MCG PO CAPS: 290.0000 ug | ORAL_CAPSULE | Freq: Every day | ORAL | Status: DC | PRN

## 2023-02-22 MED ORDER — ADULT MULTIVITAMIN W/MINERALS CH
1.0000 | ORAL_TABLET | Freq: Every day | ORAL | Status: DC
  Administered 2023-02-22 – 2023-02-26 (×5): 1 via ORAL
  Filled 2023-02-22 (×5): qty 1

## 2023-02-22 MED ORDER — THIAMINE MONONITRATE 100 MG PO TABS
100.0000 mg | ORAL_TABLET | Freq: Every day | ORAL | Status: DC
  Administered 2023-02-23 – 2023-02-25 (×2): 100 mg via ORAL
  Filled 2023-02-22 (×3): qty 1

## 2023-02-22 MED ORDER — METOPROLOL TARTRATE 25 MG PO TABS
12.5000 mg | ORAL_TABLET | Freq: Two times a day (BID) | ORAL | Status: DC
  Administered 2023-02-22 – 2023-02-26 (×10): 12.5 mg via ORAL
  Filled 2023-02-22 (×11): qty 1

## 2023-02-22 MED ORDER — LEVALBUTEROL HCL 1.25 MG/0.5ML IN NEBU
1.2500 mg | INHALATION_SOLUTION | Freq: Four times a day (QID) | RESPIRATORY_TRACT | Status: DC
  Administered 2023-02-22 – 2023-02-25 (×11): 1.25 mg via RESPIRATORY_TRACT
  Filled 2023-02-22 (×11): qty 0.5

## 2023-02-22 MED ORDER — GUAIFENESIN ER 600 MG PO TB12
600.0000 mg | ORAL_TABLET | Freq: Two times a day (BID) | ORAL | Status: DC
  Administered 2023-02-22 – 2023-02-26 (×10): 600 mg via ORAL
  Filled 2023-02-22 (×11): qty 1

## 2023-02-22 MED ORDER — ALBUTEROL SULFATE (2.5 MG/3ML) 0.083% IN NEBU: 2.5000 mg | INHALATION_SOLUTION | RESPIRATORY_TRACT | Status: DC | PRN

## 2023-02-22 MED ORDER — PIPERACILLIN-TAZOBACTAM 3.375 G IVPB
3.3750 g | Freq: Three times a day (TID) | INTRAVENOUS | Status: DC
  Administered 2023-02-22 – 2023-02-25 (×9): 3.375 g via INTRAVENOUS
  Filled 2023-02-22 (×10): qty 50

## 2023-02-22 MED ORDER — LORAZEPAM 1 MG PO TABS
1.0000 mg | ORAL_TABLET | ORAL | Status: AC | PRN
  Administered 2023-02-25: 1 mg via ORAL
  Filled 2023-02-22: qty 1

## 2023-02-22 MED ORDER — THIAMINE HCL 100 MG PO TABS
100.0000 mg | ORAL_TABLET | Freq: Every day | ORAL | Status: DC
  Administered 2023-02-22: 100 mg via ORAL
  Filled 2023-02-22 (×2): qty 1

## 2023-02-22 MED ORDER — THIAMINE HCL 100 MG/ML IJ SOLN
100.0000 mg | Freq: Every day | INTRAMUSCULAR | Status: DC
  Administered 2023-02-24 – 2023-02-26 (×2): 100 mg via INTRAVENOUS
  Filled 2023-02-22 (×2): qty 2

## 2023-02-22 MED ORDER — FOLIC ACID 1 MG PO TABS
1.0000 mg | ORAL_TABLET | Freq: Every day | ORAL | Status: DC
  Administered 2023-02-22 – 2023-02-26 (×5): 1 mg via ORAL
  Filled 2023-02-22 (×5): qty 1

## 2023-02-22 MED ORDER — PANTOPRAZOLE SODIUM 40 MG PO TBEC
40.0000 mg | DELAYED_RELEASE_TABLET | Freq: Every day | ORAL | Status: DC
  Administered 2023-02-22 – 2023-02-26 (×5): 40 mg via ORAL
  Filled 2023-02-22 (×5): qty 1

## 2023-02-22 MED ORDER — LORAZEPAM 2 MG/ML IJ SOLN
1.0000 mg | INTRAMUSCULAR | Status: AC | PRN
  Administered 2023-02-23: 2 mg via INTRAVENOUS
  Filled 2023-02-22: qty 1

## 2023-02-22 NOTE — Assessment & Plan Note (Addendum)
History of end-stage COPD with severe emphysema. Concern of lung malignancy-not a candidate for lung biopsy. Patient with history of end-stage COPD with severe emphysema, too high risk for intubation or lung biopsy.  Concern of lung mass on prior CT. Worsening shortness of breath and oxygen requirement. Patient was recently discharged home with hospice from [facility] but she came to [facility] before hospice services could established their care at home.  Now wants to revoked that status?? -Continue with bronchodilators-switching albuterol with Xopenex due to tachycardia and increasing the frequency of bronchodilators -Continue with steroid -Continue with supportive care for now

## 2023-02-22 NOTE — Assessment & Plan Note (Signed)
Patient is a lifetime heavy smoker -Nicotine patch as needed

## 2023-02-22 NOTE — Assessment & Plan Note (Signed)
-

## 2023-02-22 NOTE — Consult Note (Addendum)
PHARMACY CONSULT NOTE - FOLLOW UP  Pharmacy Consult for Electrolyte Monitoring and Replacement   Recent Labs: Potassium (mmol/L)  Date Value  02/22/2023 3.7   Magnesium (mg/dL)  Date Value  29/52/8413 1.8   Calcium (mg/dL)  Date Value  24/40/1027 8.9   Albumin (g/dL)  Date Value  25/36/6440 2.5 (L)   Phosphorus (mg/dL)  Date Value  34/74/2595 3.0   Sodium (mmol/L)  Date Value  02/22/2023 141    Assessment: Christine Serrano ia 62 y.o. female admitted 02/21/2023 for shortness of breath and possible overdose. PMH includes COPD on chronic 4L, HLD, current smoker, depression, cervical cancer, endometriosis. Plans to revoke hospice status.   Goal of Therapy:  Electrolytes WNL  Plan:  No replacement needed F/u with AM labs.   Christine Serrano, PharmD, BCPS

## 2023-02-22 NOTE — Hospital Course (Addendum)
ICU transfer for 02/22/2023.  Taken from prior notes. 62 y.o. female  with past medical history of end stage COPD on 4L [place] at baseline admitted from home on 02/21/2023 with acute on chronic hypoxic and hypercapnic respiratory failure, acute metabolic encephalopathy secondary to unintentional overdose and UTI.   Patient with recent admission at [facility] long and he was discharged home with hospice, became more hypoxic and short of breath before the home visit of hospice resulted in current hospitalization.  EMS found the patient hypotensive, hypoxic with pinpoint pimples and new onset A-fib with RVR. They administered Narcan with improvement of consciousness as well as IV fluids DuoNebs and steroids with improvement in blood pressure.   In the ED patient was lethargic, new onset A-fib with RVR and hypotensive.  She was started on Narcan and Levophed infusions and initially admitted in ICU. CT head was negative for any acute abnormality. Chest x-ray with persistent bilateral upper lobe and right lower lobe patchy airspace opacities, findings could represent multifocal pneumonia, underlying malignancy not excluded.  Recommended PET scan.  Palliative care was also consulted.  According to POA patient will return home with hospice with comfort focused care.  9/7: Heart rate still elevated at 118, needing up to 8 L of oxygen with 4 L at baseline.  Labs with decrease of hemoglobin to 8 which is close to her baseline, all cell lines decreased, prior reading likely concentrated.  Urine cultures pending.  Respiratory viral panel and MRSA negative. Patient now asking to revoke her DNR status and wants everything to be done.  Patient is extremely frail and malnourished, years of heavy smoking and drinking which she does not want to quit, likely a lung malignancy but no definitive diagnosis due to her end-stage emphysema and being high risk for any type of intubation or biopsy.  Discussed with POA and she will talk with  her.  Patient is very high risk for deterioration and mortality.  9/8: Vital stable, currently on baseline oxygen requirement of 4 L.  1 out of 4 blood cultures bottles started growing gram-negative rods-no organism identified, currently on Zosyn awaiting final culture results. Patient now wants to go back home with hospice.

## 2023-02-22 NOTE — Assessment & Plan Note (Signed)
Currently seems to be in sinus tachycardia, patient has baseline tachycardia due to her respiratory status. She did had an episode of A-fib with RVR in ED. -Adding low-dose metoprolol-will titrate as blood pressure allows. -Continue to monitor

## 2023-02-22 NOTE — Progress Notes (Signed)
Progress Note   Patient: Christine Serrano ZOX:[id number] DOB: [birth year] DOA: 02/21/2023     1 DOS: the patient was seen and examined on 02/22/2023   Brief hospital course: ICU transfer for 02/22/2023.  Taken from prior notes. 62 y.o. female  with past medical history of end stage COPD on 4L [place] at baseline admitted from home on 02/21/2023 with acute on chronic hypoxic and hypercapnic respiratory failure, acute metabolic encephalopathy secondary to unintentional overdose and UTI.   Patient with recent admission at [facility] long and he was discharged home with hospice, became more hypoxic and short of breath before the home visit of hospice resulted in current hospitalization.  EMS found the patient hypotensive, hypoxic with pinpoint pimples and new onset A-fib with RVR. They administered Narcan with improvement of consciousness as well as IV fluids DuoNebs and steroids with improvement in blood pressure.   In the ED patient was lethargic, new onset A-fib with RVR and hypotensive.  She was started on Narcan and Levophed infusions and initially admitted in ICU. CT head was negative for any acute abnormality. Chest x-ray with persistent bilateral upper lobe and right lower lobe patchy airspace opacities, findings could represent multifocal pneumonia, underlying malignancy not excluded.  Recommended PET scan.  Palliative care was also consulted.  According to POA patient will return home with hospice with comfort focused care.  9/7: Heart rate still elevated at 118, needing up to 8 L of oxygen with 4 L at baseline.  Labs with decrease of hemoglobin to 8 which is close to her baseline, all cell lines decreased, prior reading likely concentrated.  Urine cultures pending.  Respiratory viral panel and MRSA negative. Patient now asking to revoke her DNR status and wants everything to be done.  Patient is extremely frail and malnourished, years of heavy smoking and drinking which she does not want to quit, likely a  lung malignancy but no definitive diagnosis due to her end-stage emphysema and being high risk for any type of intubation or biopsy.  Discussed with POA and she will talk with her.  Patient is very high risk for deterioration and mortality.   Assessment and Plan: * COPD exacerbation (HCC) History of end-stage COPD with severe emphysema. Concern of lung malignancy-not a candidate for lung biopsy. Patient with history of end-stage COPD with severe emphysema, too high risk for intubation or lung biopsy.  Concern of lung mass on prior CT. Worsening shortness of breath and oxygen requirement. Patient was recently discharged home with hospice from [facility] but she came to [facility] before hospice services could established their care at home.  Now wants to revoked that status?? -Continue with bronchodilators-switching albuterol with Xopenex due to tachycardia and increasing the frequency of bronchodilators -Continue with steroid -Continue with supportive care for now   Atrial fibrillation with RVR (HCC) Currently seems to be in sinus tachycardia, patient has baseline tachycardia due to her respiratory status. She did had an episode of A-fib with RVR in ED. -Adding low-dose metoprolol-will titrate as blood pressure allows. -Continue to monitor  Acute on chronic respiratory failure with hypoxia and hypercapnia (HCC) Currently needing up to 8 L of oxygen with baseline use of 4 L. -Continue supplemental oxygen-try weaning to baseline as tolerated  Alcohol use Per roommate patient drinks daily. -CIWA protocol  Tobacco abuse Patient is a lifetime heavy smoker -Nicotine patch as needed  Generalized anxiety disorder with panic attacks -Continue home meds        Subjective: Patient was  feeling very short of breath and getting breathing treatment when seen today.  Per patient she does not want to be DNR and wants everything to be done to make her feel better.  Physical  Exam: Vitals:   02/22/23 0500 02/22/23 0600 02/22/23 0700 02/22/23 1212  BP: 129/69 128/76 133/73   Pulse: (!) 118 (!) 117 (!) 118   Resp: 18 19 (!) 22   Temp:      TempSrc:      SpO2: 98% 99% 97% 97%  Weight:      Height:       General.  Frail and severely malnourished lady, in no acute distress. Pulmonary.  Harsh breath sounds bilaterally, normal respiratory effort. CV.  Regular rate and rhythm, no JVD, rub or murmur. Abdomen.  Soft, nontender, nondistended, BS positive. CNS.  Alert and oriented .  No focal neurologic deficit. Extremities.  No edema, no cyanosis, pulses intact and symmetrical.  Data Reviewed: Prior data reviewed  Family Communication: Discussed with POA on phone  Disposition: Status is: Inpatient Remains inpatient appropriate because: Severity of illness  Planned Discharge Destination: Home with Home Health  Time spent: 50 minutes  This record has been created using Conservation officer, historic buildings. Errors have been sought and corrected,but may not always be located. Such creation errors do not reflect on the standard of care.   Author: Arnetha Courser, MD 02/22/2023 1:10 PM  For on call review [email or web address].

## 2023-02-22 NOTE — Progress Notes (Signed)
Attempted Echo; HR 140 bpm; patient stated she had trouble breathing; Respiratory was called to assist patient.  Dondra Prader RVT RCS

## 2023-02-22 NOTE — Assessment & Plan Note (Signed)
Per roommate patient drinks daily. -CIWA protocol

## 2023-02-22 NOTE — Progress Notes (Signed)
PHARMACY - PHYSICIAN COMMUNICATION CRITICAL VALUE ALERT - BLOOD CULTURE IDENTIFICATION (BCID)  Christine Serrano is an 62 y.o. female who presented to [facility] on 02/21/2023 with a chief complaint of COPD exacerbation on 4L  [place]end admitted for hypoxic respiratory failure, acute metabolic encephalopathy secondary to unintentional overdose and UTI. Lab called with blood cultures results from 9/6.  Assessment:  BCID results as no hits. 1st bottle NGTD, 2nd bottle 1/2 gram negative rods in the aerobic bottle.   Name of physician (or Provider) Contacted: Dr. Arnetha Courser   Current antibiotics: Ceftriaxone 1 g daily   Changes to prescribed antibiotics recommended:  Zosyn 3.375 Q8H until further culture results  Recommendations accepted by provider  Results for orders placed or performed during the hospital encounter of 02/21/23  Blood Culture ID Panel (Reflexed) (Collected: 02/21/2023  2:35 AM)  Result Value Ref Range   Enterococcus faecalis NOT DETECTED NOT DETECTED   Enterococcus Faecium NOT DETECTED NOT DETECTED   Listeria monocytogenes NOT DETECTED NOT DETECTED   Staphylococcus species NOT DETECTED NOT DETECTED   Staphylococcus aureus (BCID) NOT DETECTED NOT DETECTED   Staphylococcus epidermidis NOT DETECTED NOT DETECTED   Staphylococcus lugdunensis NOT DETECTED NOT DETECTED   Streptococcus species NOT DETECTED NOT DETECTED   Streptococcus agalactiae NOT DETECTED NOT DETECTED   Streptococcus pneumoniae NOT DETECTED NOT DETECTED   Streptococcus pyogenes NOT DETECTED NOT DETECTED   A.calcoaceticus-baumannii NOT DETECTED NOT DETECTED   Bacteroides fragilis NOT DETECTED NOT DETECTED   Enterobacterales NOT DETECTED NOT DETECTED   Enterobacter cloacae complex NOT DETECTED NOT DETECTED   Escherichia coli NOT DETECTED NOT DETECTED   Klebsiella aerogenes NOT DETECTED NOT DETECTED   Klebsiella oxytoca NOT DETECTED NOT DETECTED   Klebsiella pneumoniae NOT DETECTED NOT DETECTED   Proteus species  NOT DETECTED NOT DETECTED   Salmonella species NOT DETECTED NOT DETECTED   Serratia marcescens NOT DETECTED NOT DETECTED   Haemophilus influenzae NOT DETECTED NOT DETECTED   Neisseria meningitidis NOT DETECTED NOT DETECTED   Pseudomonas aeruginosa NOT DETECTED NOT DETECTED   Stenotrophomonas maltophilia NOT DETECTED NOT DETECTED   Candida albicans NOT DETECTED NOT DETECTED   Candida auris NOT DETECTED NOT DETECTED   Candida glabrata NOT DETECTED NOT DETECTED   Candida krusei NOT DETECTED NOT DETECTED   Candida parapsilosis NOT DETECTED NOT DETECTED   Candida tropicalis NOT DETECTED NOT DETECTED   Cryptococcus neoformans/gattii NOT DETECTED NOT DETECTED    Effie Shy, PharmD PGY1 Pharmacy Resident 02/24/2023  8:42 AM

## 2023-02-22 NOTE — Progress Notes (Signed)
Pt with several episodes with acute hypoxia. Pt with increased work of breathing and anxiety. Her o2 sats drop in 70's and RR up to 40's. Her oxygen is up to 8L and lung sounds very tight. RT giving breathing treatments and MD notified.

## 2023-02-22 NOTE — Assessment & Plan Note (Signed)
Currently needing up to 8 L of oxygen with baseline use of 4 L. -Continue supplemental oxygen-try weaning to baseline as tolerated

## 2023-02-22 NOTE — Progress Notes (Signed)
[facility] Liaison Note:   Ms. Christine Serrano is a current [facility] patient.    Patient was scheduled to transition to Hospice services with admission visit scheduled for  Sunday, 02/23/23.   Plan for patient to discharge from [facility] with hospice services at home.  Will assess for immediate DME needs closer to discharge plan.     Hospital Liaison Team  will continue to follow during hospital stay.   Please call with any Hospice related questions or concerns.  Norris Cross, RN Nurse Liaison [phone]

## 2023-02-22 NOTE — Plan of Care (Signed)
  Problem: Education: Goal: Knowledge of the prescribed therapeutic regimen will improve Outcome: Progressing   Problem: Coping: Goal: Ability to identify and develop effective coping behavior will improve Outcome: Progressing   Problem: Pain Management: Goal: Satisfaction with pain management regimen will improve Outcome: Progressing   Problem: Education: Goal: Knowledge of General Education information will improve Description: Including pain rating scale, medication(s)/side effects and non-pharmacologic comfort measures Outcome: Progressing

## 2023-02-23 ENCOUNTER — Inpatient Hospital Stay (HOSPITAL_COMMUNITY)
Admit: 2023-02-23 | Discharge: 2023-02-23 | Disposition: A | Discharge status: Home / Self Care | Payer: Medicare HMO | Attending: Pulmonary Disease | Admitting: Pulmonary Disease

## 2023-02-23 DIAGNOSIS — F410 Panic disorder [episodic paroxysmal anxiety] without agoraphobia: Secondary | ICD-10-CM | POA: Diagnosis not present

## 2023-02-23 DIAGNOSIS — I4891 Unspecified atrial fibrillation: Secondary | ICD-10-CM

## 2023-02-23 DIAGNOSIS — F411 Generalized anxiety disorder: Secondary | ICD-10-CM | POA: Diagnosis not present

## 2023-02-23 DIAGNOSIS — Z720 Tobacco use: Secondary | ICD-10-CM | POA: Diagnosis not present

## 2023-02-23 DIAGNOSIS — Z789 Other specified health status: Secondary | ICD-10-CM | POA: Diagnosis not present

## 2023-02-23 DIAGNOSIS — J441 Chronic obstructive pulmonary disease with (acute) exacerbation: Secondary | ICD-10-CM

## 2023-02-23 DIAGNOSIS — Z515 Encounter for palliative care: Secondary | ICD-10-CM | POA: Diagnosis not present

## 2023-02-23 DIAGNOSIS — J9621 Acute and chronic respiratory failure with hypoxia: Secondary | ICD-10-CM | POA: Diagnosis not present

## 2023-02-23 DIAGNOSIS — J9622 Acute and chronic respiratory failure with hypercapnia: Secondary | ICD-10-CM | POA: Diagnosis not present

## 2023-02-23 LAB — RENAL FUNCTION PANEL
Albumin: 2.8 g/dL — ABNORMAL LOW (ref 3.5–5.0)
Anion gap: 9 (ref 5–15)
BUN: 26 mg/dL — ABNORMAL HIGH (ref 8–23)
CO2: 36 mmol/L — ABNORMAL HIGH (ref 22–32)
Calcium: 9.1 mg/dL (ref 8.9–10.3)
Chloride: 95 mmol/L — ABNORMAL LOW (ref 98–111)
Creatinine, Ser: 0.51 mg/dL (ref 0.44–1.00)
GFR, Estimated: >60 mL/min (ref >60)
Glucose, Bld: 93 mg/dL (ref 70–99)
Phosphorus: 3.5 mg/dL (ref 2.5–4.6)
Potassium: 4.6 mmol/L (ref 3.5–5.1)
Sodium: 140 mmol/L (ref 135–145)

## 2023-02-23 LAB — ECHOCARDIOGRAM COMPLETE
AR max vel: 3 cm2
AV Peak grad: 3.9 mmHg
Ao pk vel: 0.98 m/s
Calc EF: 41.7 %
Height: 65 in
S' Lateral: 3.5 cm
Single Plane A2C EF: 40.4 %
Single Plane A4C EF: 42.2 %
Weight: 1636.7 [oz_av]

## 2023-02-23 LAB — CBC
HCT: 28.6 % — ABNORMAL LOW (ref 36.0–46.0)
Hemoglobin: 8.5 g/dL — ABNORMAL LOW (ref 12.0–15.0)
MCH: 28 pg (ref 26.0–34.0)
MCHC: 29.7 g/dL — ABNORMAL LOW (ref 30.0–36.0)
MCV: 94.1 fL (ref 80.0–100.0)
Platelets: 203 10*3/uL (ref 150–400)
RBC: 3.04 MIL/uL — ABNORMAL LOW (ref 3.87–5.11)
RDW: 13.2 % (ref 11.5–15.5)
WBC: 7.9 10*3/uL (ref 4.0–10.5)
nRBC: 0 % (ref 0.0–0.2)

## 2023-02-23 LAB — PROCALCITONIN: Procalcitonin: <0.1 ng/mL

## 2023-02-23 MED ORDER — SODIUM CHLORIDE 0.9 % IV BOLUS
500.0000 mL | Freq: Once | INTRAVENOUS | Status: AC
  Administered 2023-02-23: 500 mL via INTRAVENOUS

## 2023-02-23 NOTE — Progress Notes (Signed)
Pt became agitated this afternoon.Trying to get out of her chair to look for cigarettes and believed she was in her home. Unable to re-orient or calm pt. Two mg ativan administered per CIWA sliding scale orders. Medication effective but became hypotensive and MD notified. 500cc NS bolus ordered. Will continue to monitor.

## 2023-02-23 NOTE — Consult Note (Signed)
PHARMACY CONSULT NOTE - FOLLOW UP  Pharmacy Consult for Electrolyte Monitoring and Replacement   Recent Labs: Potassium (mmol/L)  Date Value  02/23/2023 4.6   Magnesium (mg/dL)  Date Value  16/03/9603 1.8   Calcium (mg/dL)  Date Value  54/02/8118 9.1   Albumin (g/dL)  Date Value  14/78/2956 2.8 (L)   Phosphorus (mg/dL)  Date Value  21/30/8657 3.5   Sodium (mmol/L)  Date Value  02/23/2023 140    Assessment: Darl Pikes L. Neece ia 62 y.o. female admitted 02/21/2023 for shortness of breath and possible overdose. PMH includes COPD on chronic 4L, HLD, current smoker, depression, cervical cancer, endometriosis. Plans to revoke hospice status.   Goal of Therapy:  Electrolytes WNL  Plan:  No replacement needed F/u with AM labs.   Paschal Dopp, PharmD, BCPS

## 2023-02-23 NOTE — TOC CM/SW Note (Signed)
CSW spoke with MD who advised pt maybe medically ready for DC Today and wanted to confirm if she can, Per [place] the patient can DC with caregiver, until 9/9. The caregiver reports they will need a rolling walker for patient to get back and forth to restroom. I relayed this to [place] & the MD. Diannia Ruder will put in order with [facility]. Per MD patient was given Ativan and BP dropped so they will monitor this and have an update soon on DC.

## 2023-02-23 NOTE — Assessment & Plan Note (Signed)
Improved, currently on baseline oxygen requirement of 4 L -Continue supplemental oxygen-try weaning to baseline as tolerated

## 2023-02-23 NOTE — Progress Notes (Signed)
[facility] Liaison Note  Follow up on current [facility] palliative care patient who is transitioning to hospice services upon discharge from [facility].  Patient was going to discharge today, however, MD wanted to keep patient overnight as she had hypotension after receiving some ativan.  Called and spoke with caregiver, who would like admission on Tuesday.  Patient request walker.  DME will be assessed and ordered by admission visit nurse.  Will continue to follow through final disposition.  Norris Cross, RN Nurse Liaison [phone]

## 2023-02-23 NOTE — Plan of Care (Signed)
Pt is Oriented to self. Denies pain. Becomes dyspneic with activity and eating meals but recovers with rest. Tolerating sitting up in chair and using BSC at this time with assist. Pt is anxious to get back home as soon as possible.

## 2023-02-23 NOTE — Progress Notes (Signed)
Progress Note   Patient: Christine Serrano:[id number] DOB: 1960/07/11 DOA: 02/21/2023     2 DOS: the patient was seen and examined on 02/23/2023   Brief hospital course: ICU transfer for 02/22/2023.  Taken from prior notes. 62 y.o. female  with past medical history of end stage COPD on 4L [place] at baseline admitted from home on 02/21/2023 with acute on chronic hypoxic and hypercapnic respiratory failure, acute metabolic encephalopathy secondary to unintentional overdose and UTI.   Patient with recent admission at [facility] and he was discharged home with hospice, became more hypoxic and short of breath before the home visit of hospice resulted in current hospitalization.  EMS found the patient hypotensive, hypoxic with pinpoint pimples and new onset A-fib with RVR. They administered Narcan with improvement of consciousness as well as IV fluids DuoNebs and steroids with improvement in blood pressure.   In the ED patient was lethargic, new onset A-fib with RVR and hypotensive.  She was started on Narcan and Levophed infusions and initially admitted in ICU. CT head was negative for any acute abnormality. Chest x-ray with persistent bilateral upper lobe and right lower lobe patchy airspace opacities, findings could represent multifocal pneumonia, underlying malignancy not excluded.  Recommended PET scan.  Palliative care was also consulted.  According to POA patient will return home with hospice with comfort focused care.  9/7: Heart rate still elevated at 118, needing up to 8 L of oxygen with 4 L at baseline.  Labs with decrease of hemoglobin to 8 which is close to her baseline, all cell lines decreased, prior reading likely concentrated.  Urine cultures pending.  Respiratory viral panel and MRSA negative. Patient now asking to revoke her DNR status and wants everything to be done.  Patient is extremely frail and malnourished, years of heavy smoking and drinking which she does not want to quit, likely a  lung malignancy but no definitive diagnosis due to her end-stage emphysema and being high risk for any type of intubation or biopsy.  Discussed with POA and she will talk with her.  Patient is very high risk for deterioration and mortality.  9/8: Vital stable, currently on baseline oxygen requirement of 4 L.  1 out of 4 blood cultures bottles started growing gram-negative rods-no organism identified, currently on Zosyn awaiting final culture results. Patient now wants to go back home with hospice.   Assessment and Plan: * COPD exacerbation (HCC) History of end-stage COPD with severe emphysema. Concern of lung malignancy-not a candidate for lung biopsy. Patient with history of end-stage COPD with severe emphysema, too high risk for intubation or lung biopsy.  Concern of lung mass on prior CT. Worsening shortness of breath and oxygen requirement. Patient was recently discharged home with hospice from [facility] but she came to [facility] before hospice services could established their care at home.  -Continue with bronchodilators-switching albuterol with Xopenex due to tachycardia and increasing the frequency of bronchodilators -Continue with steroid -Continue with supportive care for now   Atrial fibrillation with RVR (HCC) Currently seems to be in sinus tachycardia, patient has baseline tachycardia due to her respiratory status. She did had an episode of A-fib with RVR in ED. -Adding low-dose metoprolol-will titrate as blood pressure allows. -Continue to monitor  Acute on chronic respiratory failure with hypoxia and hypercapnia (HCC) Improved, currently on baseline oxygen requirement of 4 L -Continue supplemental oxygen-try weaning to baseline as tolerated  Alcohol use Per roommate patient drinks daily. -CIWA protocol  Tobacco  abuse Patient is a lifetime heavy smoker -Nicotine patch as needed  Generalized anxiety disorder with panic attacks -Continue home meds       Subjective: Patient was seen and examined today.  She wants to go home with hospice and continue smoking.  Asking for cigarettes.  Physical Exam: Vitals:   02/23/23 1335 02/23/23 1357 02/23/23 1358 02/23/23 1400  BP: (!) 82/64 (!) 79/64 (!) 116/53 102/65  Pulse: 95 95 96 95  Resp: (!) 21 (!) 21 20 19   Temp:      TempSrc:      SpO2: 100% 100% 100% 100%  Weight:      Height:       General.  Frail and severely malnourished lady, in no acute distress. Pulmonary.  Harsh breath sounds, bilaterally, normal respiratory effort. CV.  Regular rate and rhythm, no JVD, rub or murmur. Abdomen.  Soft, nontender, nondistended, BS positive. CNS.  Alert and oriented .  No focal neurologic deficit. Extremities.  No edema, no cyanosis, pulses intact and symmetrical. Psychiatry.  Judgment and insight appears normal.  Data Reviewed: Prior data reviewed  Family Communication: Discussed with POA on phone  Disposition: Status is: Inpatient Remains inpatient appropriate because: Severity of illness  Planned Discharge Destination: Home with Home Health  Time spent: 45 minutes  This record has been created using Conservation officer, historic buildings. Errors have been sought and corrected,but may not always be located. Such creation errors do not reflect on the standard of care.   Author: Arnetha Courser, MD 02/23/2023 2:18 PM  For on call review [email or web address].

## 2023-02-23 NOTE — Progress Notes (Signed)
Palliative Medicine  Name: Christine Serrano Date: 02/23/2023 MRN: [id number]  DOB: [birth year]  Patient Care Team: Eustaquio Boyden, MD as PCP - General (Family Medicine) Kathyrn Sheriff, [facility] (Inactive) as Pharmacist (Pharmacist)    REASON FOR CONSULTATION: Christine Serrano is a 62 y.o. female with multiple medical problems including end stage COPD on 4L [place] at baseline admitted from home on 02/21/2023 with acute on chronic hypoxic and hypercapnic respiratory failure, unintentional overdose and UTI.   CODE STATUS: DNR  PAST MEDICAL HISTORY: Past Medical History:  Diagnosis Date   Cervical cancer (HCC)    Chronic idiopathic constipation    Community acquired pneumonia of right upper lobe of lung 12/10/2016   COPD (chronic obstructive pulmonary disease) (HCC)    Depression    Endometriosis    HLD (hyperlipidemia)    Smoker     PAST SURGICAL HISTORY:  Past Surgical History:  Procedure Laterality Date   ABLATION ON ENDOMETRIOSIS     APPENDECTOMY     BREAST EXCISIONAL BIOPSY Left age 40   benign biopsy - chronic fatty deposit   COLONOSCOPY WITH PROPOFOL N/A 08/04/2017   TAx3, angiodysplastic lesion, diverticulosis, rpt 5 yrs ([facility])   EXPLORATORY LAPAROTOMY  1992   endometriosis   FOOT SURGERY Right    needle imbeded   LASER ABLATION CONDYLOMA CERVICAL / VULVAR     NASAL SEPTUM SURGERY Bilateral    OVARIAN CYST SURGERY Bilateral 1980   remote   SALPINGOOPHORECTOMY Right 1983   ectopic pregnancy   TONSILLECTOMY      HEMATOLOGY/ONCOLOGY HISTORY:  Oncology History   No history exists.    ALLERGIES:  is allergic to terbinafine and related, clarithromycin, and imitrex [sumatriptan].  MEDICATIONS:  Current Facility-Administered Medications  Medication Dose Route Frequency Provider Last Rate Last Admin   0.9 %  sodium chloride infusion  250 mL Intravenous Continuous Rust-Chester, Cecelia Byars, NP   Stopped at 02/21/23 0902   acetaminophen (TYLENOL) tablet 650  mg  650 mg Oral Q6H PRN Erin Fulling, MD   650 mg at 02/22/23 1826   albuterol (PROVENTIL) (2.5 MG/3ML) 0.083% nebulizer solution 2.5 mg  2.5 mg Nebulization Q4H PRN Arnetha Courser, MD       ALPRAZolam Prudy Feeler) tablet 0.5 mg  0.5 mg Oral BID PRN Harlon Ditty D, NP   0.5 mg at 02/23/23 0851   budesonide (PULMICORT) nebulizer solution 0.25 mg  0.25 mg Nebulization BID Rust-Chester, Britton L, NP   0.25 mg at 02/23/23 0747   buPROPion (WELLBUTRIN XL) 24 hr tablet 150 mg  150 mg Oral Daily Lowella Bandy, RPH   150 mg at 02/23/23 8413   Chlorhexidine Gluconate Cloth 2 % PADS 6 each  6 each Topical K4401 Erin Fulling, MD   6 each at 02/23/23 0847   docusate sodium (COLACE) capsule 100 mg  100 mg Oral BID PRN Rust-Chester, Cecelia Byars, NP       feeding supplement (ENSURE ENLIVE / ENSURE PLUS) liquid 237 mL  237 mL Oral BID BM Harlon Ditty D, NP   237 mL at 02/23/23 0855   folic acid (FOLVITE) tablet 1 mg  1 mg Oral Daily Arnetha Courser, MD   1 mg at 02/23/23 0851   guaiFENesin (MUCINEX) 12 hr tablet 600 mg  600 mg Oral BID Arnetha Courser, MD   600 mg at 02/23/23 0851   influenza vac split trivalent PF (FLULAVAL) injection 0.5 mL  0.5 mL Intramuscular Tomorrow-1000 Erin Fulling, MD  levalbuterol (XOPENEX) nebulizer solution 1.25 mg  1.25 mg Nebulization Q6H Arnetha Courser, MD   1.25 mg at 02/23/23 1321   linaclotide (LINZESS) capsule 290 mcg  290 mcg Oral Daily PRN Arnetha Courser, MD       LORazepam (ATIVAN) tablet 1-4 mg  1-4 mg Oral Q1H PRN Arnetha Courser, MD       Or   LORazepam (ATIVAN) injection 1-4 mg  1-4 mg Intravenous Q1H PRN Arnetha Courser, MD   2 mg at 02/23/23 1217   methylPREDNISolone sodium succinate (SOLU-MEDROL) 40 mg/mL injection 40 mg  40 mg Intravenous Daily Harlon Ditty D, NP   40 mg at 02/23/23 1610   metoprolol tartrate (LOPRESSOR) tablet 12.5 mg  12.5 mg Oral BID Arnetha Courser, MD   12.5 mg at 02/23/23 9604   morphine (PF) 2 MG/ML injection 0.5-1 mg  0.5-1 mg Intravenous Q4H  PRN Judithe Modest, NP   1 mg at 02/23/23 0502   multivitamin with minerals tablet 1 tablet  1 tablet Oral Daily Arnetha Courser, MD   1 tablet at 02/23/23 0851   naloxone HCl (NARCAN) 4 mg in dextrose 5 % 250 mL infusion  0.25 mg/hr Intravenous Continuous Rust-Chester, Cecelia Byars, NP   Stopped at 02/22/23 0109   norepinephrine (LEVOPHED) 4mg  in (0.016 mg/mL) premix infusion  2-10 mcg/min Intravenous Titrated Rust-Chester, Cecelia Byars, NP       Oral care mouth rinse  15 mL Mouth Rinse PRN Erin Fulling, MD       pantoprazole (PROTONIX) EC tablet 40 mg  40 mg Oral Daily Arnetha Courser, MD   40 mg at 02/23/23 0850   piperacillin-tazobactam (ZOSYN) IVPB 3.375 g  3.375 g Intravenous Q8H Arnetha Courser, MD 12.5 mL/hr at 02/23/23 1359 3.375 g at 02/23/23 1359   polyethylene glycol (MIRALAX / GLYCOLAX) packet 17 g  17 g Oral Daily PRN Rust-Chester, Cecelia Byars, NP       sertraline (ZOLOFT) tablet 100 mg  100 mg Oral Daily Lowella Bandy, RPH   100 mg at 02/23/23 5409   thiamine (VITAMIN B1) tablet 100 mg  100 mg Oral Daily Arnetha Courser, MD   100 mg at 02/23/23 8119   Or   thiamine (VITAMIN B1) injection 100 mg  100 mg Intravenous Daily Arnetha Courser, MD        VITAL SIGNS: BP 103/62   Pulse 94   Temp 98 F (36.7 C) (Axillary)   Resp 20   Ht 5\' 5"  (1.651 m)   Wt 102 lb 4.7 oz (46.4 kg)   LMP  (LMP Unknown)   SpO2 100%   BMI 17.02 kg/m  Filed Weights   02/21/23 0830 02/22/23 0157 02/23/23 0551  Weight: 99 lb 13.9 oz (45.3 kg) 100 lb 1.4 oz (45.4 kg) 102 lb 4.7 oz (46.4 kg)    Estimated body mass index is 17.02 kg/m as calculated from the following:   Height as of this encounter: 5\' 5"  (1.651 m).   Weight as of this encounter: 102 lb 4.7 oz (46.4 kg).  LABS: CBC:    Component Value Date/Time   WBC 7.9 02/23/2023 0308   HGB 8.5 (L) 02/23/2023 0308   HCT 28.6 (L) 02/23/2023 0308   PLT 203 02/23/2023 0308   MCV 94.1 02/23/2023 0308   NEUTROABS 4.4 02/21/2023 0230   LYMPHSABS 3.9  02/21/2023 0230   MONOABS 0.7 02/21/2023 0230   EOSABS 0.4 02/21/2023 0230   BASOSABS 0.0 02/21/2023 0230   Comprehensive Metabolic  Panel:    Component Value Date/Time   NA 140 02/23/2023 0308   K 4.6 02/23/2023 0308   CL 95 (L) 02/23/2023 0308   CO2 36 (H) 02/23/2023 0308   BUN 26 (H) 02/23/2023 0308   CREATININE 0.51 02/23/2023 0308   CREATININE 0.58 02/16/2021 1455   GLUCOSE 93 02/23/2023 0308   CALCIUM 9.1 02/23/2023 0308   AST 21 02/21/2023 0230   ALT 14 02/21/2023 0230   ALKPHOS 62 02/21/2023 0230   BILITOT 0.1 (L) 02/21/2023 0230   PROT 6.0 (L) 02/21/2023 0230   ALBUMIN 2.8 (L) 02/23/2023 0308    RADIOGRAPHIC STUDIES: ECHOCARDIOGRAM COMPLETE  Result Date: 02/23/2023    ECHOCARDIOGRAM REPORT   Patient Name:   RETHA HARMES Date of Exam: 02/23/2023 Medical Rec #:  [id number]        Height:       65.0 in Accession #:    [id number]       Weight:       102.3 lb Date of Birth:  [birth year]        BSA:          1.488 m Patient Age:    62 years         BP:           123/93 mmHg Patient Gender: F                HR:           103 bpm. Exam Location:  [facility] Procedure: 2D Echo Indications:     Atrial Fibrillation I48.91  History:         Patient has prior history of Echocardiogram examinations.  Sonographer:     Elwin Sleight RDCS Referring Phys:  [id number] Judithe Modest Diagnosing Phys: Julien Nordmann MD  Sonographer Comments: Image acquisition challenging due to uncooperative patient and Image acquisition challenging due to respiratory motion. IMPRESSIONS  1. Left ventricular ejection fraction, by estimation, is 40 to 45%. The left ventricle has mildly decreased function. The left ventricle demonstrates global hypokinesis. There is mild left ventricular hypertrophy. Left ventricular diastolic parameters are indeterminate. There is the interventricular septum is flattened in systole, consistent with right ventricular pressure overload.  2. Right ventricular systolic function is mildly reduced.  The right ventricular size is normal. There is severely elevated pulmonary artery systolic pressure. The estimated right ventricular systolic pressure is 67.9 mmHg.  3. Moderate pleural effusion in the left lateral region.  4. The mitral valve is normal in structure. Moderate mitral valve regurgitation. No evidence of mitral stenosis.  5. The aortic valve is normal in structure. Aortic valve regurgitation is mild. Aortic valve sclerosis is present, with no evidence of aortic valve stenosis.  6. The inferior vena cava is normal in size with <50% respiratory variability, suggesting right atrial pressure of 8 mmHg. FINDINGS  Left Ventricle: Left ventricular ejection fraction, by estimation, is 40 to 45%. The left ventricle has mildly decreased function. The left ventricle demonstrates global hypokinesis. The left ventricular internal cavity size was normal in size. There is  mild left ventricular hypertrophy. The interventricular septum is flattened in systole, consistent with right ventricular pressure overload. Left ventricular diastolic parameters are indeterminate. Right Ventricle: The right ventricular size is normal. No increase in right ventricular wall thickness. Right ventricular systolic function is mildly reduced. There is severely elevated pulmonary artery systolic pressure. The tricuspid regurgitant velocity is 3.87 m/s, and with an assumed right atrial pressure of 8 mmHg, the  estimated right ventricular systolic pressure is 67.9 mmHg. Left Atrium: Left atrial size was normal in size. Right Atrium: Right atrial size was normal in size. Pericardium: There is no evidence of pericardial effusion. Mitral Valve: The mitral valve is normal in structure. Moderate mitral valve regurgitation. No evidence of mitral valve stenosis. Tricuspid Valve: The tricuspid valve is normal in structure. Tricuspid valve regurgitation is mild . No evidence of tricuspid stenosis. Aortic Valve: The aortic valve is normal in  structure. Aortic valve regurgitation is mild. Aortic valve sclerosis is present, with no evidence of aortic valve stenosis. Aortic valve peak gradient measures 3.9 mmHg. Pulmonic Valve: The pulmonic valve was normal in structure. Pulmonic valve regurgitation is not visualized. No evidence of pulmonic stenosis. Aorta: The aortic root is normal in size and structure. Venous: The inferior vena cava is normal in size with less than 50% respiratory variability, suggesting right atrial pressure of 8 mmHg. IAS/Shunts: No atrial level shunt detected by color flow Doppler. Additional Comments: There is a moderate pleural effusion in the left lateral region.  LEFT VENTRICLE PLAX 2D LVIDd:         4.50 cm LVIDs:         3.50 cm LV PW:         1.10 cm LV IVS:        1.20 cm LVOT diam:     2.20 cm LV SV:         46 LV SV Index:   31 LVOT Area:     3.80 cm  LV Volumes (MOD) LV vol d, MOD A2C: 83.1 ml LV vol d, MOD A4C: 62.6 ml LV vol s, MOD A2C: 49.5 ml LV vol s, MOD A4C: 36.2 ml LV SV MOD A2C:     33.6 ml LV SV MOD A4C:     62.6 ml LV SV MOD BP:      30.4 ml RIGHT VENTRICLE RV Basal diam:  3.40 cm RV S prime:     8.05 cm/s TAPSE (M-mode): 2.0 cm LEFT ATRIUM             Index        RIGHT ATRIUM           Index LA diam:        3.00 cm 2.02 cm/m   RA Area:     16.60 cm LA Vol (A2C):   48.2 ml 32.40 ml/m  RA Volume:   49.30 ml  33.14 ml/m LA Vol (A4C):   48.5 ml 32.60 ml/m LA Biplane Vol: 51.0 ml 34.28 ml/m  AORTIC VALVE                 PULMONIC VALVE AV Area (Vmax): 3.00 cm     PV Vmax:        0.67 m/s AV Vmax:        98.40 cm/s   PV Peak grad:   1.8 mmHg AV Peak Grad:   3.9 mmHg     RVOT Peak grad: 1 mmHg LVOT Vmax:      77.70 cm/s LVOT Vmean:     50.800 cm/s LVOT VTI:       0.120 m  AORTA Ao Root diam: 3.10 cm Ao Asc diam:  3.00 cm TRICUSPID VALVE TR Peak grad:   59.9 mmHg TR Vmax:        387.00 cm/s  SHUNTS Systemic VTI:  0.12 m Systemic Diam: 2.20 cm Julien Nordmann MD Electronically signed by Julien Nordmann MD  Signature Date/Time: 02/23/2023/12:11:22 PM    Final    CT HEAD WO CONTRAST ( )  Result Date: 02/21/2023 CLINICAL DATA:  Provided history: Mental status change, unknown cause. EXAM: CT HEAD WITHOUT CONTRAST TECHNIQUE: Contiguous axial images were obtained from the base of the skull through the vertex without intravenous contrast. RADIATION DOSE REDUCTION: This exam was performed according to the departmental dose-optimization program which includes automated exposure control, adjustment of the mA and/or kV according to patient size and/or use of iterative reconstruction technique. COMPARISON:  Head CT 11/12/2022. FINDINGS: Brain: Generalized cerebral atrophy. There is no acute intracranial hemorrhage. No demarcated cortical infarct. No extra-axial fluid collection. No evidence of an intracranial mass. No midline shift. Vascular: No hyperdense vessel.  Atherosclerotic calcifications. Skull: No calvarial fracture. Redemonstrated small chronic sclerotic focus within the right parietal calvarium. Sinuses/Orbits: No mass or acute finding within the imaged orbits. No significant paranasal sinus disease at the imaged levels. IMPRESSION: 1. No evidence of an acute intracranial abnormality. 2. Generalized cerebral atrophy. Electronically Signed   By: Jackey Loge D.O.   On: 02/21/2023 08:44   DG Chest Port 1 View  Result Date: 02/21/2023 CLINICAL DATA:  Sepsis these results were communicated via [email or web address] application on 02/21/2023 at 2:56 am. EXAM: PORTABLE CHEST 1 VIEW COMPARISON:  CT chest 11/12/2022, chest x-ray 02/14/2023 FINDINGS: The heart and mediastinal contours are within normal limits. Hyperinflation of the lungs. Persistent bilateral upper lobe and right lower lobe patchy airspace opacities. Chronic coarsened interstitial markings with no overt pulmonary edema. No pleural effusion. No pneumothorax. No acute osseous abnormality. IMPRESSION: 1. Persistent bilateral upper lobe and right lower lobe patchy airspace  opacities. Finding could represent multifocal pneumonia with underlying malignancy not excluded. Recommend PET-CT as noted on CT angio chest 11/12/2022. 2. Aortic Atherosclerosis (ICD10-I70.0) and Emphysema (ICD10-J43.9). Electronically Signed   By: Tish Frederickson M.D.   On: 02/21/2023 03:02   DG Chest Port 1 View  Result Date: 02/14/2023 CLINICAL DATA:  Shortness of breath.  History of lung cancer. EXAM: PORTABLE CHEST 1 VIEW COMPARISON:  11/12/2022 FINDINGS: There is hyperinflation of the lungs compatible with COPD. Bilateral upper lobe opacities are again noted likely related to fibrosis and chronic lung disease, stable since prior study. No definite acute infiltrate. Blunting of the left costophrenic angle may reflect small left pleural effusion. IMPRESSION: Emphysema. Chronic upper lobe densities are stable, likely fibrosis. Small left pleural effusion. Electronically Signed   By: Charlett Nose M.D.   On: 02/14/2023 19:44    PERFORMANCE STATUS (ECOG) : 4 - Bedbound  Review of Systems Unable to complete  Physical Exam General: Ill-appearing Cardiovascular: regular rate and rhythm Pulmonary: clear ant fields, on O2 Extremities: no edema, no joint deformities Skin: no rashes Neurological: Sedated  IMPRESSION: I am covering today for the inpatient palliative care service.  Follow-up visit per request of Dr. Nelson Chimes.   Patient remains in ICU, with O2 ranging between 4 L to 8 L.  Patient currently sedated after receiving lorazepam.  However, I note that early today she communicated desire to discharge home with hospice.  This is possibly being planned for tomorrow.  PLAN: -Home with hospice   Time Total: 15 minutes  Visit consisted of counseling and education dealing with the complex and emotionally intense issues of symptom management and palliative care in the setting of serious and potentially life-threatening illness.Greater than 50%  of this time was spent counseling and coordinating  care related to the above assessment and plan.  Signed by:  Laurette Schimke, PhD, NP-C

## 2023-02-23 NOTE — Assessment & Plan Note (Signed)
History of end-stage COPD with severe emphysema. Concern of lung malignancy-not a candidate for lung biopsy. Patient with history of end-stage COPD with severe emphysema, too high risk for intubation or lung biopsy.  Concern of lung mass on prior CT. Worsening shortness of breath and oxygen requirement. Patient was recently discharged home with hospice from [facility] but she came to [facility] before hospice services could established their care at home.  -Continue with bronchodilators-switching albuterol with Xopenex due to tachycardia and increasing the frequency of bronchodilators -Continue with steroid -Continue with supportive care for now

## 2023-02-24 DIAGNOSIS — Z720 Tobacco use: Secondary | ICD-10-CM | POA: Diagnosis not present

## 2023-02-24 DIAGNOSIS — F411 Generalized anxiety disorder: Secondary | ICD-10-CM | POA: Diagnosis not present

## 2023-02-24 DIAGNOSIS — F410 Panic disorder [episodic paroxysmal anxiety] without agoraphobia: Secondary | ICD-10-CM | POA: Diagnosis not present

## 2023-02-24 DIAGNOSIS — J9622 Acute and chronic respiratory failure with hypercapnia: Secondary | ICD-10-CM | POA: Diagnosis not present

## 2023-02-24 DIAGNOSIS — J9621 Acute and chronic respiratory failure with hypoxia: Secondary | ICD-10-CM | POA: Diagnosis not present

## 2023-02-24 DIAGNOSIS — Z515 Encounter for palliative care: Secondary | ICD-10-CM | POA: Diagnosis not present

## 2023-02-24 DIAGNOSIS — I4891 Unspecified atrial fibrillation: Secondary | ICD-10-CM | POA: Diagnosis not present

## 2023-02-24 DIAGNOSIS — Z789 Other specified health status: Secondary | ICD-10-CM | POA: Diagnosis not present

## 2023-02-24 DIAGNOSIS — J441 Chronic obstructive pulmonary disease with (acute) exacerbation: Secondary | ICD-10-CM | POA: Diagnosis not present

## 2023-02-24 LAB — RENAL FUNCTION PANEL
Albumin: 3 g/dL — ABNORMAL LOW (ref 3.5–5.0)
Anion gap: 10 (ref 5–15)
BUN: 26 mg/dL — ABNORMAL HIGH (ref 8–23)
CO2: 38 mmol/L — ABNORMAL HIGH (ref 22–32)
Calcium: 9.2 mg/dL (ref 8.9–10.3)
Chloride: 93 mmol/L — ABNORMAL LOW (ref 98–111)
Creatinine, Ser: 0.41 mg/dL — ABNORMAL LOW (ref 0.44–1.00)
GFR, Estimated: >60 mL/min (ref >60)
Glucose, Bld: 114 mg/dL — ABNORMAL HIGH (ref 70–99)
Phosphorus: 3.7 mg/dL (ref 2.5–4.6)
Potassium: 4.6 mmol/L (ref 3.5–5.1)
Sodium: 141 mmol/L (ref 135–145)

## 2023-02-24 LAB — CBC
HCT: 30.2 % — ABNORMAL LOW (ref 36.0–46.0)
Hemoglobin: 8.8 g/dL — ABNORMAL LOW (ref 12.0–15.0)
MCH: 27.8 pg (ref 26.0–34.0)
MCHC: 29.1 g/dL — ABNORMAL LOW (ref 30.0–36.0)
MCV: 95.3 fL (ref 80.0–100.0)
Platelets: 219 10*3/uL (ref 150–400)
RBC: 3.17 MIL/uL — ABNORMAL LOW (ref 3.87–5.11)
RDW: 13.2 % (ref 11.5–15.5)
WBC: 8.3 10*3/uL (ref 4.0–10.5)
nRBC: 0 % (ref 0.0–0.2)

## 2023-02-24 LAB — MAGNESIUM: Magnesium: 2 mg/dL (ref 1.7–2.4)

## 2023-02-24 MED ORDER — ENSURE ENLIVE PO LIQD: 237.0000 mL | Freq: Two times a day (BID) | ORAL | 12 refills | Status: DC

## 2023-02-24 MED ORDER — METOPROLOL TARTRATE 25 MG PO TABS: 12.5000 mg | ORAL_TABLET | Freq: Two times a day (BID) | ORAL | 1 refills | Status: DC

## 2023-02-24 MED ORDER — DOCUSATE SODIUM 100 MG PO CAPS: 100.0000 mg | ORAL_CAPSULE | Freq: Two times a day (BID) | ORAL | 0 refills | Status: DC | PRN

## 2023-02-24 MED ORDER — PREDNISONE 20 MG PO TABS: 40.0000 mg | ORAL_TABLET | Freq: Every day | ORAL | 0 refills | Status: AC

## 2023-02-24 NOTE — Consult Note (Signed)
[facility] Customer service manager [facility]) Accountable Care Organization (ACO) [facility] Liaison Note  02/24/2023  [name] 04-27-1961 [id number]  Location: [facility] RN Hospital Liaison screened the patient remotely at [facility].  Insurance: [facility]   [name] is a 62 y.o. female who is a Primary Care Patient of Eustaquio Boyden, MD. The patient was screened for 7 and 30 day readmission hospitalization with noted high risk score for unplanned readmission risk with 3 IP in 6 months.  The patient was assessed for potential [facility]) Care Management service needs for post hospital transition for care coordination. Review of patient's electronic medical record reveals patient was admitted for COPD. Pt will be transitioning to home with hospice upon her discharge. No care management needs presented at this time.   [facility] Care Management/Population Health does not replace or interfere with any arrangements made by the Inpatient Transition of Care team.   For questions contact:   Elliot Cousin, RN, [facility] Liaison [place]   Population Health Office Hours MTWF  8:00 am-6:00 pm [phone] mobile [phone] [Office toll free line] Office Hours are M-F 8:30 - 5 pm [email or web address]

## 2023-02-24 NOTE — Discharge Summary (Signed)
Physician Discharge Summary   Patient: Christine Serrano MRN: [id number] DOB: 1960-11-03  Admit date:     02/21/2023  Discharge date: 02/25/23  Discharge Physician: Arnetha Courser   PCP: Eustaquio Boyden, MD   Recommendations at discharge:  Follow-up with hospice and primary care provider  Discharge Diagnoses: Principal Problem:   COPD exacerbation (HCC) Active Problems:   Atrial fibrillation with RVR (HCC)   Acute on chronic respiratory failure with hypoxia and hypercapnia (HCC)   Alcohol use   Tobacco abuse   Generalized anxiety disorder with panic attacks   Palliative care encounter   Hospital Course: ICU transfer for 02/22/2023.  Taken from prior notes. 62 y.o. female  with past medical history of end stage COPD on 4L [place] at baseline admitted from home on 02/21/2023 with acute on chronic hypoxic and hypercapnic respiratory failure, acute metabolic encephalopathy secondary to unintentional overdose and UTI.   Patient with recent admission at [facility] long and he was discharged home with hospice, became more hypoxic and short of breath before the home visit of hospice resulted in current hospitalization.  EMS found the patient hypotensive, hypoxic with pinpoint pimples and new onset A-fib with RVR. They administered Narcan with improvement of consciousness as well as IV fluids DuoNebs and steroids with improvement in blood pressure.   In the ED patient was lethargic, new onset A-fib with RVR and hypotensive.  She was started on Narcan and Levophed infusions and initially admitted in ICU. CT head was negative for any acute abnormality. Chest x-ray with persistent bilateral upper lobe and right lower lobe patchy airspace opacities, findings could represent multifocal pneumonia, underlying malignancy not excluded.  Recommended PET scan.  Palliative care was also consulted.  According to POA patient will return home with hospice with comfort focused care.  9/7: Heart rate still elevated at  118, needing up to 8 L of oxygen with 4 L at baseline.  Labs with decrease of hemoglobin to 8 which is close to her baseline, all cell lines decreased, prior reading likely concentrated.  Urine cultures pending.  Respiratory viral panel and MRSA negative. Patient now asking to revoke her DNR status and wants everything to be done.  Patient is extremely frail and malnourished, years of heavy smoking and drinking which she does not want to quit, likely a lung malignancy but no definitive diagnosis due to her end-stage emphysema and being high risk for any type of intubation or biopsy.  Discussed with POA and she will talk with her.  Patient is very high risk for deterioration and mortality.  Had a poor prognosis.  9/8: Vital stable, currently on baseline oxygen requirement of 4 L.  1 out of 4 blood cultures bottles started growing gram-negative rods-no organism identified, currently on Zosyn awaiting final culture results. Patient now wants to go back home with hospice.   9/9: Hemodynamically stable.  She is being discharged home with hospice.  She has been given 3 days of prednisone.  Blood cultures unable to identify any organism, likely a contaminant as she does not have any other sign of infection.  Received antibiotics while in the hospital and they were not continued.  She can follow-up with PCP or hospice services if develop any new symptoms and they can treat according to her goals of care.  She will continue her hospice and other home medications and home hospice services will take over from here.  Assessment and Plan: * COPD exacerbation (HCC) History of end-stage COPD with severe emphysema. Concern  of lung malignancy-not a candidate for lung biopsy. Patient with history of end-stage COPD with severe emphysema, too high risk for intubation or lung biopsy.  Concern of lung mass on prior CT. Worsening shortness of breath and oxygen requirement. Patient was recently discharged home with  hospice from [facility] but she came to [facility] before hospice services could established their care at home.  -Continue with bronchodilators-switching albuterol with Xopenex due to tachycardia and increasing the frequency of bronchodilators -Continue with steroid -Continue with supportive care for now   Atrial fibrillation with RVR (HCC) Currently seems to be in sinus tachycardia, patient has baseline tachycardia due to her respiratory status. She did had an episode of A-fib with RVR in ED. -Adding low-dose metoprolol-will titrate as blood pressure allows. -Continue to monitor  Acute on chronic respiratory failure with hypoxia and hypercapnia (HCC) Improved, currently on baseline oxygen requirement of 4 L -Continue supplemental oxygen-try weaning to baseline as tolerated  Alcohol use Per roommate patient drinks daily. -CIWA protocol  Tobacco abuse Patient is a lifetime heavy smoker -Nicotine patch as needed  Generalized anxiety disorder with panic attacks -Continue home meds      Pain control - [place] Controlled Substance Reporting System database was reviewed. and patient was instructed, not to drive, operate heavy machinery, perform activities at heights, swimming or participation in water activities or provide baby-sitting services while on Pain, Sleep and Anxiety Medications; until their outpatient Physician has advised to do so again. Also recommended to not to take more than prescribed Pain, Sleep and Anxiety Medications.  Consultants: PCCM Procedures performed: None Disposition: Hospice care Diet recommendation:  Regular diet DISCHARGE MEDICATION: Allergies as of 02/25/2023       Reactions   Terbinafine And Related Diarrhea   Diarrheal illness while on terbinafine ?related to med   Clarithromycin    Caused Thrush    Imitrex [sumatriptan] Other (See Comments)   Worsens migraine         Medication List     STOP taking these medications     Prolia 60 MG/ML Sosy injection Generic drug: denosumab       TAKE these medications    albuterol (2.5 MG/3ML) 0.083% nebulizer solution Commonly known as: PROVENTIL INHALE CONTENTS OF 1 VIAL VIA NEBULIZER EVERY 6 HOURS AS NEEDED FOR WHEEZE OR SHORTNESS OF BREATH What changed: See the new instructions.   albuterol 108 (90 Base) MCG/ACT inhaler Commonly known as: VENTOLIN HFA Inhale 1-2 puffs into the lungs every 6 (six) hours as needed for wheezing or shortness of breath. What changed: Another medication with the same name was changed. Make sure you understand how and when to take each.   benzonatate 100 MG capsule Commonly known as: TESSALON Take 1 capsule (100 mg total) by mouth 3 (three) times daily as needed for cough.   Breztri Aerosphere 160-9-4.8 MCG/ACT Aero Generic drug: Budeson-Glycopyrrol-Formoterol INHALE 2 PUFFS INTO THE LUNGS IN THE MORNING AND AT BEDTIME.   buPROPion 150 MG 24 hr tablet Commonly known as: Wellbutrin XL Take 1 tablet (150 mg total) by mouth daily.   cholecalciferol 25 MCG (1000 UNIT) tablet Commonly known as: VITAMIN D3 Take 1,000 Units by mouth daily.   cyanocobalamin 1000 MCG tablet Commonly known as: VITAMIN B12 Take 1 tablet (1,000 mcg total) by mouth daily.   docusate sodium 100 MG capsule Commonly known as: COLACE Take 1 capsule (100 mg total) by mouth 2 (two) times daily as needed for mild constipation.   feeding supplement  Liqd Take 237 mLs by mouth 2 (two) times daily between meals.   fluticasone 50 MCG/ACT nasal spray Commonly known as: FLONASE Place 2 sprays into both nostrils daily.   folic acid 1 MG tablet Commonly known as: FOLVITE Take 1 tablet (1 mg total) by mouth daily.   gabapentin 100 MG capsule Commonly known as: NEURONTIN Take 1 capsule (100 mg total) by mouth 2 (two) times daily.   glycopyrrolate 1 MG tablet Commonly known as: ROBINUL Take 1 tablet (1 mg total) by mouth every 4 (four) hours as needed  (excessive secretions).   guaiFENesin 600 MG 12 hr tablet Commonly known as: MUCINEX Take 1 tablet (600 mg total) by mouth 2 (two) times daily.   hydrOXYzine 25 MG tablet Commonly known as: ATARAX Take 1 tablet (25 mg total) by mouth 2 (two) times daily as needed for anxiety.   Iron (Ferrous Sulfate) 325 (65 Fe) MG Tabs Take 325 mg by mouth every other day.   levofloxacin 500 MG tablet Commonly known as: LEVAQUIN Take 1 tablet (500 mg total) by mouth daily for 7 days.   Linzess 290 MCG Caps capsule Generic drug: linaclotide Take 1 capsule (290 mcg total) by mouth daily as needed (constipation).   loratadine 10 MG tablet Commonly known as: CLARITIN TAKE 1 TABLET BY MOUTH EVERY DAY AS NEEDED What changed: reasons to take this   LORazepam 0.5 MG tablet Commonly known as: ATIVAN Take 1 tablet (0.5 mg total) by mouth every 6 (six) hours as needed for anxiety or sleep.   meclizine 25 MG tablet Commonly known as: ANTIVERT Take 1 tablet (25 mg total) by mouth 3 (three) times daily as needed for dizziness.   methocarbamol 500 MG tablet Commonly known as: ROBAXIN Take 1 tablet (500 mg total) by mouth 3 (three) times daily between meals as needed for muscle spasms.   metoprolol tartrate 25 MG tablet Commonly known as: LOPRESSOR Take 0.5 tablets (12.5 mg total) by mouth 2 (two) times daily.   morphine CONCENTRATE 10 MG/0.5ML Soln concentrated solution Place 0.25 mLs (5 mg total) under the tongue every 3 (three) hours as needed for severe pain, moderate pain or shortness of breath.   multivitamin with minerals Tabs tablet Take 1 tablet by mouth daily.   naproxen 375 MG tablet Commonly known as: NAPROSYN TAKE 1 TABLET BY MOUTH TWICE  DAILY AS NEEDED FOR PAIN WITH  FOOD   omeprazole 40 MG capsule Commonly known as: PRILOSEC TAKE 1 CAPSULE BY MOUTH DAILY. FOR THREE WEEKS THEN AS NEEDED. TAKE 30 MIN PRIOR TO LARGE MEAL   OXYGEN Inhale 4 L/min into the lungs continuous.    Potassium 99 MG Tabs Take 99 mg by mouth daily.   predniSONE 20 MG tablet Commonly known as: DELTASONE Take 2 tablets (40 mg total) by mouth daily with breakfast for 3 days.   sertraline 100 MG tablet Commonly known as: ZOLOFT Take 1.5 tablets (150 mg total) by mouth daily.   thiamine 100 MG tablet Commonly known as: VITAMIN B1 Take 1 tablet (100 mg total) by mouth daily.   traZODone 50 MG tablet Commonly known as: DESYREL TAKE 1 TABLET BY MOUTH EVERYDAY AT BEDTIME         Discharge Exam: Filed Weights   02/23/23 0551 02/24/23 0500 02/25/23 0500  Weight: 46.4 kg 47.6 kg 44.3 kg   General.  Frail and severely malnourished lady, in no acute distress. Pulmonary.  Harsh breath sounds bilaterally, normal respiratory effort. CV.  Regular rate  and rhythm, no JVD, rub or murmur. Abdomen.  Soft, nontender, nondistended, BS positive. CNS.  Alert and oriented .  No focal neurologic deficit. Extremities.  No edema, no cyanosis, pulses intact and symmetrical. Psychiatry.  Judgment and insight appears normal.   Condition at discharge: stable  The results of significant diagnostics from this hospitalization (including imaging, microbiology, ancillary and laboratory) are listed below for reference.   Imaging Studies: ECHOCARDIOGRAM COMPLETE  Result Date: 02/23/2023    ECHOCARDIOGRAM REPORT   Patient Name:   Christine Serrano Date of Exam: 02/23/2023 Medical Rec #:  [id number]        Height:       65.0 in Accession #:    [id number]       Weight:       102.3 lb Date of Birth:  1961-04-15        BSA:          1.488 m Patient Age:    62 years         BP:           123/93 mmHg Patient Gender: F                HR:           103 bpm. Exam Location:  [facility] Procedure: 2D Echo Indications:     Atrial Fibrillation I48.91  History:         Patient has prior history of Echocardiogram examinations.  Sonographer:     Elwin Sleight RDCS Referring Phys:  [id number] Judithe Modest Diagnosing Phys: Julien Nordmann  MD  Sonographer Comments: Image acquisition challenging due to uncooperative patient and Image acquisition challenging due to respiratory motion. IMPRESSIONS  1. Left ventricular ejection fraction, by estimation, is 40 to 45%. The left ventricle has mildly decreased function. The left ventricle demonstrates global hypokinesis. There is mild left ventricular hypertrophy. Left ventricular diastolic parameters are indeterminate. There is the interventricular septum is flattened in systole, consistent with right ventricular pressure overload.  2. Right ventricular systolic function is mildly reduced. The right ventricular size is normal. There is severely elevated pulmonary artery systolic pressure. The estimated right ventricular systolic pressure is 67.9 mmHg.  3. Moderate pleural effusion in the left lateral region.  4. The mitral valve is normal in structure. Moderate mitral valve regurgitation. No evidence of mitral stenosis.  5. The aortic valve is normal in structure. Aortic valve regurgitation is mild. Aortic valve sclerosis is present, with no evidence of aortic valve stenosis.  6. The inferior vena cava is normal in size with <50% respiratory variability, suggesting right atrial pressure of 8 mmHg. FINDINGS  Left Ventricle: Left ventricular ejection fraction, by estimation, is 40 to 45%. The left ventricle has mildly decreased function. The left ventricle demonstrates global hypokinesis. The left ventricular internal cavity size was normal in size. There is  mild left ventricular hypertrophy. The interventricular septum is flattened in systole, consistent with right ventricular pressure overload. Left ventricular diastolic parameters are indeterminate. Right Ventricle: The right ventricular size is normal. No increase in right ventricular wall thickness. Right ventricular systolic function is mildly reduced. There is severely elevated pulmonary artery systolic pressure. The tricuspid regurgitant velocity is  3.87 m/s, and with an assumed right atrial pressure of 8 mmHg, the estimated right ventricular systolic pressure is 67.9 mmHg. Left Atrium: Left atrial size was normal in size. Right Atrium: Right atrial size was normal in size. Pericardium: There is no evidence of pericardial effusion. Mitral  Valve: The mitral valve is normal in structure. Moderate mitral valve regurgitation. No evidence of mitral valve stenosis. Tricuspid Valve: The tricuspid valve is normal in structure. Tricuspid valve regurgitation is mild . No evidence of tricuspid stenosis. Aortic Valve: The aortic valve is normal in structure. Aortic valve regurgitation is mild. Aortic valve sclerosis is present, with no evidence of aortic valve stenosis. Aortic valve peak gradient measures 3.9 mmHg. Pulmonic Valve: The pulmonic valve was normal in structure. Pulmonic valve regurgitation is not visualized. No evidence of pulmonic stenosis. Aorta: The aortic root is normal in size and structure. Venous: The inferior vena cava is normal in size with less than 50% respiratory variability, suggesting right atrial pressure of 8 mmHg. IAS/Shunts: No atrial level shunt detected by color flow Doppler. Additional Comments: There is a moderate pleural effusion in the left lateral region.  LEFT VENTRICLE PLAX 2D LVIDd:         4.50 cm LVIDs:         3.50 cm LV PW:         1.10 cm LV IVS:        1.20 cm LVOT diam:     2.20 cm LV SV:         46 LV SV Index:   31 LVOT Area:     3.80 cm  LV Volumes (MOD) LV vol d, MOD A2C: 83.1 ml LV vol d, MOD A4C: 62.6 ml LV vol s, MOD A2C: 49.5 ml LV vol s, MOD A4C: 36.2 ml LV SV MOD A2C:     33.6 ml LV SV MOD A4C:     62.6 ml LV SV MOD BP:      30.4 ml RIGHT VENTRICLE RV Basal diam:  3.40 cm RV S prime:     8.05 cm/s TAPSE (M-mode): 2.0 cm LEFT ATRIUM             Index        RIGHT ATRIUM           Index LA diam:        3.00 cm 2.02 cm/m   RA Area:     16.60 cm LA Vol (A2C):   48.2 ml 32.40 ml/m  RA Volume:   49.30 ml  33.14 ml/m  LA Vol (A4C):   48.5 ml 32.60 ml/m LA Biplane Vol: 51.0 ml 34.28 ml/m  AORTIC VALVE                 PULMONIC VALVE AV Area (Vmax): 3.00 cm     PV Vmax:        0.67 m/s AV Vmax:        98.40 cm/s   PV Peak grad:   1.8 mmHg AV Peak Grad:   3.9 mmHg     RVOT Peak grad: 1 mmHg LVOT Vmax:      77.70 cm/s LVOT Vmean:     50.800 cm/s LVOT VTI:       0.120 m  AORTA Ao Root diam: 3.10 cm Ao Asc diam:  3.00 cm TRICUSPID VALVE TR Peak grad:   59.9 mmHg TR Vmax:        387.00 cm/s  SHUNTS Systemic VTI:  0.12 m Systemic Diam: 2.20 cm Julien Nordmann MD Electronically signed by Julien Nordmann MD Signature Date/Time: 02/23/2023/12:11:22 PM    Final    CT HEAD WO CONTRAST ( )  Result Date: 02/21/2023 CLINICAL DATA:  Provided history: Mental status change, unknown cause. EXAM: CT HEAD WITHOUT CONTRAST TECHNIQUE:  Contiguous axial images were obtained from the base of the skull through the vertex without intravenous contrast. RADIATION DOSE REDUCTION: This exam was performed according to the departmental dose-optimization program which includes automated exposure control, adjustment of the mA and/or kV according to patient size and/or use of iterative reconstruction technique. COMPARISON:  Head CT 11/12/2022. FINDINGS: Brain: Generalized cerebral atrophy. There is no acute intracranial hemorrhage. No demarcated cortical infarct. No extra-axial fluid collection. No evidence of an intracranial mass. No midline shift. Vascular: No hyperdense vessel.  Atherosclerotic calcifications. Skull: No calvarial fracture. Redemonstrated small chronic sclerotic focus within the right parietal calvarium. Sinuses/Orbits: No mass or acute finding within the imaged orbits. No significant paranasal sinus disease at the imaged levels. IMPRESSION: 1. No evidence of an acute intracranial abnormality. 2. Generalized cerebral atrophy. Electronically Signed   By: Jackey Loge D.O.   On: 02/21/2023 08:44   DG Chest Port 1 View  Result Date:  02/21/2023 CLINICAL DATA:  Sepsis these results were communicated via [email or web address] application on 02/21/2023 at 2:56 am. EXAM: PORTABLE CHEST 1 VIEW COMPARISON:  CT chest 11/12/2022, chest x-ray 02/14/2023 FINDINGS: The heart and mediastinal contours are within normal limits. Hyperinflation of the lungs. Persistent bilateral upper lobe and right lower lobe patchy airspace opacities. Chronic coarsened interstitial markings with no overt pulmonary edema. No pleural effusion. No pneumothorax. No acute osseous abnormality. IMPRESSION: 1. Persistent bilateral upper lobe and right lower lobe patchy airspace opacities. Finding could represent multifocal pneumonia with underlying malignancy not excluded. Recommend PET-CT as noted on CT angio chest 11/12/2022. 2. Aortic Atherosclerosis (ICD10-I70.0) and Emphysema (ICD10-J43.9). Electronically Signed   By: Tish Frederickson M.D.   On: 02/21/2023 03:02   DG Chest Port 1 View  Result Date: 02/14/2023 CLINICAL DATA:  Shortness of breath.  History of lung cancer. EXAM: PORTABLE CHEST 1 VIEW COMPARISON:  11/12/2022 FINDINGS: There is hyperinflation of the lungs compatible with COPD. Bilateral upper lobe opacities are again noted likely related to fibrosis and chronic lung disease, stable since prior study. No definite acute infiltrate. Blunting of the left costophrenic angle may reflect small left pleural effusion. IMPRESSION: Emphysema. Chronic upper lobe densities are stable, likely fibrosis. Small left pleural effusion. Electronically Signed   By: Charlett Nose M.D.   On: 02/14/2023 19:44    Microbiology: Results for orders placed or performed during the hospital encounter of 02/21/23  Blood Culture (routine x 2)     Status: None (Preliminary result)   Collection Time: 02/21/23  2:30 AM   Specimen: BLOOD  Result Value Ref Range Status   Specimen Description BLOOD LEFT ANTECUBITAL  Final   Special Requests   Final    BOTTLES DRAWN AEROBIC AND ANAEROBIC Blood Culture adequate  volume   Culture   Final    NO GROWTH 4 DAYS Performed at [facility], [address] [place], [place] [address]    Report Status PENDING  Incomplete  SARS Coronavirus 2 by RT PCR (hospital order, performed in [facility] hospital lab) *cepheid single result test* Anterior Nasal Swab     Status: None   Collection Time: 02/21/23  2:30 AM   Specimen: Anterior Nasal Swab  Result Value Ref Range Status   SARS Coronavirus 2 by RT PCR NEGATIVE NEGATIVE Final    Comment: (NOTE) SARS-CoV-2 target nucleic acids are NOT DETECTED.  The SARS-CoV-2 RNA is generally detectable in upper and lower respiratory specimens during the acute phase of infection. The lowest concentration of SARS-CoV-2 viral copies this assay  can detect is 250 copies / mL. A negative result does not preclude SARS-CoV-2 infection and should not be used as the sole basis for treatment or other patient management decisions.  A negative result may occur with improper specimen collection / handling, submission of specimen other than nasopharyngeal swab, presence of viral mutation(s) within the areas targeted by this assay, and inadequate number of viral copies (<250 copies / mL). A negative result must be combined with clinical observations, patient history, and epidemiological information.  Fact Sheet for Patients:   RoadLapTop.co.za  Fact Sheet for Healthcare Providers: http://kim-miller.com/  This test is not yet approved or  cleared by the [place] FDA and has been authorized for detection and/or diagnosis of SARS-CoV-2 by FDA under an Emergency Use Authorization (EUA).  This EUA will remain in effect (meaning this test can be used) for the duration of the COVID-19 declaration under Section 564(b)(1) of the Act, 21 U.S.C. section 360bbb-3(b)(1), unless the authorization is terminated or revoked sooner.  Performed at [facility], [address] [place], [place] [address]   Blood Culture (routine x 2)     Status: None   Collection Time: 02/21/23  2:35 AM   Specimen: BLOOD  Result Value Ref Range Status   Specimen Description   Final    BLOOD RIGHT ANTECUBITAL Performed at [facility], [address] [place], [place] [address]    Special Requests   Final    BOTTLES DRAWN AEROBIC AND ANAEROBIC Blood Culture adequate volume Performed at [facility], [address], [place], [place] [address]    Culture  Setup Time   Final    GRAM NEGATIVE RODS AEROBIC BOTTLE ONLY CRITICAL RESULT CALLED TO, READ BACK BY AND VERIFIED WITH: ANDREA DOBBS 02/22/2023 AT 1351 SRR    Culture   Final    ROSEOMONAS GILARDII Standardized susceptibility testing for this organism is not available. Performed at [facility], [address] [place], [place] [address]    Report Status 02/25/2023 FINAL  Final  Blood Culture ID Panel (Reflexed)     Status: None   Collection Time: 02/21/23  2:35 AM  Result Value Ref Range Status   Enterococcus faecalis NOT DETECTED NOT DETECTED Final   Enterococcus Faecium NOT DETECTED NOT DETECTED Final   Listeria monocytogenes NOT DETECTED NOT DETECTED Final   Staphylococcus species NOT DETECTED NOT DETECTED Final   Staphylococcus aureus (BCID) NOT DETECTED NOT DETECTED Final   Staphylococcus epidermidis NOT DETECTED NOT DETECTED Final   Staphylococcus lugdunensis NOT DETECTED NOT DETECTED Final   Streptococcus species NOT DETECTED NOT DETECTED Final   Streptococcus agalactiae NOT DETECTED NOT DETECTED Final   Streptococcus pneumoniae NOT DETECTED NOT DETECTED Final   Streptococcus pyogenes NOT DETECTED NOT DETECTED Final   A.calcoaceticus-baumannii NOT DETECTED NOT DETECTED Final   Bacteroides fragilis NOT DETECTED NOT DETECTED Final   Enterobacterales NOT DETECTED NOT DETECTED Final   Enterobacter cloacae complex NOT DETECTED NOT DETECTED Final   Escherichia coli NOT DETECTED NOT DETECTED  Final   Klebsiella aerogenes NOT DETECTED NOT DETECTED Final   Klebsiella oxytoca NOT DETECTED NOT DETECTED Final   Klebsiella pneumoniae NOT DETECTED NOT DETECTED Final   Proteus species NOT DETECTED NOT DETECTED Final   Salmonella species NOT DETECTED NOT DETECTED Final   Serratia marcescens NOT DETECTED NOT DETECTED Final   Haemophilus influenzae NOT DETECTED NOT DETECTED Final   Neisseria meningitidis NOT DETECTED NOT DETECTED Final   Pseudomonas aeruginosa NOT DETECTED  NOT DETECTED Final   Stenotrophomonas maltophilia NOT DETECTED NOT DETECTED Final   Candida albicans NOT DETECTED NOT DETECTED Final   Candida auris NOT DETECTED NOT DETECTED Final   Candida glabrata NOT DETECTED NOT DETECTED Final   Candida krusei NOT DETECTED NOT DETECTED Final   Candida parapsilosis NOT DETECTED NOT DETECTED Final   Candida tropicalis NOT DETECTED NOT DETECTED Final   Cryptococcus neoformans/gattii NOT DETECTED NOT DETECTED Final    Comment: Performed at [facility], [address] [place], [place] [address]  MRSA Next Gen by PCR, Nasal     Status: None   Collection Time: 02/21/23  8:33 AM   Specimen: Nasal Mucosa; Nasal Swab  Result Value Ref Range Status   MRSA by PCR Next Gen NOT DETECTED NOT DETECTED Final    Comment: (NOTE) The GeneXpert MRSA Assay (FDA approved for NASAL specimens only), is one component of a comprehensive MRSA colonization surveillance program. It is not intended to diagnose MRSA infection nor to guide or monitor treatment for MRSA infections. Test performance is not FDA approved in patients less than 40 years old. Performed at [facility], [address] [place], [place] [address]   Respiratory (~20 pathogens) panel by PCR     Status: None   Collection Time: 02/21/23  9:39 AM   Specimen: Nasopharyngeal Swab; Respiratory  Result Value Ref Range Status   Adenovirus NOT DETECTED NOT DETECTED Final   Coronavirus 229E NOT DETECTED NOT DETECTED  Final    Comment: (NOTE) The Coronavirus on the Respiratory Panel, DOES NOT test for the novel  Coronavirus (2019 nCoV)    Coronavirus HKU1 NOT DETECTED NOT DETECTED Final   Coronavirus NL63 NOT DETECTED NOT DETECTED Final   Coronavirus OC43 NOT DETECTED NOT DETECTED Final   Metapneumovirus NOT DETECTED NOT DETECTED Final   Rhinovirus / Enterovirus NOT DETECTED NOT DETECTED Final   Influenza A NOT DETECTED NOT DETECTED Final   Influenza B NOT DETECTED NOT DETECTED Final   Parainfluenza Virus 1 NOT DETECTED NOT DETECTED Final   Parainfluenza Virus 2 NOT DETECTED NOT DETECTED Final   Parainfluenza Virus 3 NOT DETECTED NOT DETECTED Final   Parainfluenza Virus 4 NOT DETECTED NOT DETECTED Final   Respiratory Syncytial Virus NOT DETECTED NOT DETECTED Final   Bordetella pertussis NOT DETECTED NOT DETECTED Final   Bordetella Parapertussis NOT DETECTED NOT DETECTED Final   Chlamydophila pneumoniae NOT DETECTED NOT DETECTED Final   Mycoplasma pneumoniae NOT DETECTED NOT DETECTED Final    Comment: Performed at [facility], [address], [place], [place] [address]  Urine Culture     Status: Abnormal   Collection Time: 02/21/23 12:37 PM   Specimen: Urine, Random  Result Value Ref Range Status   Specimen Description   Final    URINE, RANDOM Performed at [facility], [address], [place], [place] [address]    Special Requests   Final    NONE Reflexed from [id number] Performed at [facility], [address] [place], [place] [address]    Culture (A)  Final    <10,000 COLONIES/mL INSIGNIFICANT GROWTH Performed at [facility], [address] [place], [place] [address]    Report Status 02/22/2023 FINAL  Final   *Note: Due to a large number of results and/or encounters for the requested time period, some results have not been displayed. A complete set of results can be found in Results Review.    Labs: CBC: Recent Labs  Lab 02/21/23 0230 02/22/23 0746  02/23/23 0308 02/24/23 0237  WBC 9.5 7.9 7.9 8.3  NEUTROABS 4.4  --   --   --   HGB 9.4* 8.0* 8.5* 8.8*  HCT 31.7* 26.1* 28.6* 30.2*  MCV 94.9 92.6 94.1 95.3  PLT 212 194 203 219   Basic Metabolic Panel: Recent Labs  Lab 02/21/23 0230 02/22/23 0746 02/23/23 0308 02/24/23 0237  NA 140 141 140 141  K 3.7 3.7 4.6 4.6  CL 94* 97* 95* 93*  CO2 39* 39* 36* 38*  GLUCOSE 165* 146* 93 114*  BUN 15 21 26* 26*  CREATININE 0.46 0.37* 0.51 0.41*  CALCIUM 9.0 8.9 9.1 9.2  MG  --  1.8  --  2.0  PHOS  --  3.0 3.5 3.7   Liver Function Tests: Recent Labs  Lab 02/21/23 0230 02/22/23 0746 02/23/23 0308 02/24/23 0237  AST 21  --   --   --   ALT 14  --   --   --   ALKPHOS 62  --   --   --   BILITOT 0.1*  --   --   --   PROT 6.0*  --   --   --   ALBUMIN 3.2* 2.5* 2.8* 3.0*   CBG: Recent Labs  Lab 02/21/23 0246 02/21/23 0829  GLUCAP 137* 130*    Discharge time spent: greater than 30 minutes.  Signed: Arnetha Courser, MD [facility] 02/25/2023

## 2023-02-24 NOTE — Care Management Important Message (Signed)
Important Message  Patient Details  Name: Christine Serrano MRN: [id number] Date of Birth: Jan 05, 1961   Medicare Important Message Given:  Other (see comment)  Disposition to discharge with hospice services.  Medicare IM withheld at this time.   Johnell Comings 02/24/2023, 10:03 AM

## 2023-02-25 LAB — CULTURE, BLOOD (ROUTINE X 2): Special Requests: ADEQUATE

## 2023-02-25 MED ORDER — LEVOFLOXACIN 500 MG PO TABS
500.0000 mg | ORAL_TABLET | Freq: Every day | ORAL | Status: DC
  Administered 2023-02-25 – 2023-02-26 (×2): 500 mg via ORAL
  Filled 2023-02-25 (×2): qty 1

## 2023-02-25 MED ORDER — LEVOFLOXACIN 500 MG PO TABS: 500.0000 mg | ORAL_TABLET | Freq: Every day | ORAL | 0 refills | Status: AC

## 2023-02-25 MED ORDER — LEVALBUTEROL HCL 1.25 MG/0.5ML IN NEBU
1.2500 mg | INHALATION_SOLUTION | Freq: Two times a day (BID) | RESPIRATORY_TRACT | Status: DC
  Administered 2023-02-25 – 2023-02-26 (×2): 1.25 mg via RESPIRATORY_TRACT
  Filled 2023-02-25 (×3): qty 0.5

## 2023-02-25 NOTE — Progress Notes (Signed)
Patient is currently confused, impulsive, oriented to self only and actively hallucinating. Patient is also a high fall risk with low safety awareness and requires frequent redirection and almost constant supervision.  Per MD and case manager notes pt to be discharged with home hospice with roommate who is not full time caregiver. Safety concerns expressed to both MD and case manager- awaiting response.

## 2023-02-25 NOTE — TOC Progression Note (Signed)
Transition of Care [facility]) - Progression Note    Patient Details  Name: Christine Serrano MRN: [id number] Date of Birth: 1960/09/19  [facility] CM/SW Contact  Truddie Hidden, RN Phone Number: 02/25/2023, 4:12 PM  Clinical Narrative:    Spoke with patient's roommate Christine Serrano. She was advised of patient's recent change that is requiring constant supervision. Christine Serrano stated she was previously caring for the patient. She was with her most of the time and had camera in the home to watch patient if she was out. She stated she is all the patient has around here. Christine Serrano is unable to stay with the patient 24 hours. She is trying to locate a friend that may be able to stay with her but no one else has been identified at this time. MD, nurse, and hospice liaison notified.      Barriers to Discharge: Barriers Resolved  Expected Discharge Plan and Services     Post Acute Care Choice: Hospice Living arrangements for the past 2 months: Single Family Home Expected Discharge Date: 02/24/23                                     Social Determinants of Health (SDOH) Interventions SDOH Screenings   Food Insecurity: No Food Insecurity (02/21/2023)  Housing: Low Risk  (02/21/2023)  Transportation Needs: No Transportation Needs (02/21/2023)  Utilities: Not At Risk (02/21/2023)  Alcohol Screen: Low Risk  (10/01/2022)  Depression (PHQ2-9): High Risk (02/04/2023)  Financial Resource Strain: Low Risk  (10/01/2022)  Physical Activity: Inactive (10/01/2022)  Social Connections: Socially Isolated (10/01/2022)  Stress: Stress Concern Present (10/01/2022)  Tobacco Use: High Risk (02/21/2023)    Readmission Risk Interventions    02/15/2023    5:23 PM 11/14/2022   10:20 AM  Readmission Risk Prevention Plan  Transportation Screening Complete Complete  PCP or Specialist Appt within 5-7 Days Complete   Home Care Screening Complete Complete  Medication Review (RN CM) Complete

## 2023-02-25 NOTE — Progress Notes (Addendum)
Patient was discharged home with hospice yesterday but her roommate could not pick her up.  She is planning to pick her up today and then home hospice will take over from there.  No changes were made to her management.  Addendum.  Blood culture eventually resulted some very weird and rare bacteria, discussed with ID pharmacy and decided to treat with 1 week of Levaquin.

## 2023-02-25 NOTE — Plan of Care (Signed)
Pt alert and oriented x 1-2. CIWA required ativan at approx 0200 due to increased agitation. Level 7. 1 mg ativan given. Pt up to bsc with assist. No complaints of pain. Meds whole in applesauce. Vitals stable.  Problem: Education: Goal: Knowledge of the prescribed therapeutic regimen will improve Outcome: Progressing   Problem: Coping: Goal: Ability to identify and develop effective coping behavior will improve Outcome: Progressing   Problem: Clinical Measurements: Goal: Quality of life will improve Outcome: Progressing   Problem: Respiratory: Goal: Verbalizations of increased ease of respirations will increase Outcome: Progressing   Problem: Role Relationship: Goal: Family's ability to cope with current situation will improve Outcome: Progressing Goal: Ability to verbalize concerns, feelings, and thoughts to partner or family member will improve Outcome: Progressing   Problem: Pain Management: Goal: Satisfaction with pain management regimen will improve Outcome: Progressing   Problem: Education: Goal: Knowledge of General Education information will improve Description: Including pain rating scale, medication(s)/side effects and non-pharmacologic comfort measures Outcome: Progressing   Problem: Health Behavior/Discharge Planning: Goal: Ability to manage health-related needs will improve Outcome: Progressing   Problem: Clinical Measurements: Goal: Ability to maintain clinical measurements within normal limits will improve Outcome: Progressing Goal: Will remain free from infection Outcome: Progressing Goal: Diagnostic test results will improve Outcome: Progressing Goal: Respiratory complications will improve Outcome: Progressing Goal: Cardiovascular complication will be avoided Outcome: Progressing   Problem: Activity: Goal: Risk for activity intolerance will decrease Outcome: Progressing   Problem: Nutrition: Goal: Adequate nutrition will be maintained Outcome:  Progressing   Problem: Coping: Goal: Level of anxiety will decrease Outcome: Progressing   Problem: Elimination: Goal: Will not experience complications related to bowel motility Outcome: Progressing Goal: Will not experience complications related to urinary retention Outcome: Progressing   Problem: Pain Managment: Goal: General experience of comfort will improve Outcome: Progressing   Problem: Safety: Goal: Ability to remain free from injury will improve Outcome: Progressing   Problem: Skin Integrity: Goal: Risk for impaired skin integrity will decrease Outcome: Progressing

## 2023-02-25 NOTE — Progress Notes (Signed)
Nutrition Follow-up  DOCUMENTATION CODES:   Underweight  INTERVENTION:   -Continue regular diet -Continue Ensure Enlive po BID, each supplement provides 350 kcal and 20 grams of protein.   NUTRITION DIAGNOSIS:   Increased nutrient needs related to catabolic illness as evidenced by estimated needs.  Ongoing  GOAL:   Patient will meet greater than or equal to 90% of their needs  Progressing   MONITOR:   Supplement acceptance, PO intake, Labs, Weight trends, Skin, I & O's  REASON FOR ASSESSMENT:   Malnutrition Screening Tool    ASSESSMENT:   62 y.o. female with PMHx significant for End Stage COPD on 4 L supplemental Oxygen at baseline, Afib, MDD, etoh abuse, anxiety, HLD, Hep C, IDA, lung mass, folate deficiency and followed by home hospice who is admitted with AECOPD, acute metabolic encephalopathy  unintentional opioid overdose and UTI.  Reviewed I/O's: +1.5 L since admission   Pt receiving personal care at time of visit.   Pt remains on a regular diet with fair appetite. Noted meal completions 60-75%. Pt is drinking Ensure supplements, however, refused this morning's dose.   Wt has been stable since admission.   Plan to d/c home with hospice.   Medications reviewed and include folic acid, solu-medrol, and thiamine.   Labs reviewed: CBGS: 130.    Diet Order:   Diet Order             Diet - low sodium heart healthy           Diet regular Room service appropriate? Yes; Fluid consistency: Thin  Diet effective now                   EDUCATION NEEDS:   No education needs have been identified at this time  Skin:  Skin Assessment: Reviewed RN Assessment  Last BM:  02/24/23 (type 5)  Height:   Ht Readings from Last 1 Encounters:  02/21/23 5\' 5"  (1.651 m)    Weight:   Wt Readings from Last 1 Encounters:  02/25/23 44.3 kg    Ideal Body Weight:  56.8 kg  BMI:  Body mass index is 16.25 kg/m.  Estimated Nutritional Needs:   Kcal:   1400-1600kcal/day  Protein:  70-80g/day  Fluid:  1.2-1.4L/day    Levada Schilling, RD, LDN, CDCES Registered Dietitian II Certified Diabetes Care and Education Specialist Please refer to AMION for RD and/or RD on-call/weekend/after hours pager

## 2023-02-25 NOTE — TOC Transition Note (Signed)
Transition of Care [facility]) - CM/SW Discharge Note   Patient Details  Name: Christine Serrano MRN: [id number] Date of Birth: 1960-10-14  Transition of Care [facility]) CM/SW Contact:  Truddie Hidden, RN Phone Number: 02/25/2023, 9:38 AM   Clinical Narrative:    Spoke with patient's Christine Serrano regarding her discharge today. Patient will be transported by her roommate at approximately 6-7pm. Christine Serrano stated she has talked to the hospice liaison and they are schedule to come to see the patient tommorrow. Nurse and MD notified   TOC signing off.          Patient Goals and CMS Choice CMS Medicare.gov Compare Post Acute Care list provided to:: Patient Choice offered to / list presented to : Patient  Discharge Placement                         Discharge Plan and Services Additional resources added to the After Visit Summary for       Post Acute Care Choice: Hospice                               Social Determinants of Health (SDOH) Interventions SDOH Screenings   Food Insecurity: No Food Insecurity (02/21/2023)  Housing: Low Risk  (02/21/2023)  Transportation Needs: No Transportation Needs (02/21/2023)  Utilities: Not At Risk (02/21/2023)  Alcohol Screen: Low Risk  (10/01/2022)  Depression (PHQ2-9): High Risk (02/04/2023)  Financial Resource Strain: Low Risk  (10/01/2022)  Physical Activity: Inactive (10/01/2022)  Social Connections: Socially Isolated (10/01/2022)  Stress: Stress Concern Present (10/01/2022)  Tobacco Use: High Risk (02/21/2023)     Readmission Risk Interventions    02/15/2023    5:23 PM 11/14/2022   10:20 AM  Readmission Risk Prevention Plan  Transportation Screening Complete Complete  PCP or Specialist Appt within 5-7 Days Complete   Home Care Screening Complete Complete  Medication Review (RN CM) Complete

## 2023-02-26 LAB — CULTURE, BLOOD (ROUTINE X 2)
Culture: NO GROWTH
Special Requests: ADEQUATE

## 2023-02-26 NOTE — TOC Transition Note (Signed)
Transition of Care [facility]) - Progression Note    Patient Details  Name: [name] MRN: [id number] Date of Birth: 1961-04-27  Transition of Care [facility]) CM/SW Contact  Truddie Hidden, RN Phone Number: 02/26/2023, 11:13 AM  Clinical Narrative:    Per MD patient is stable to dischare back to previous environment with her roommate.   Spoke with Christine Serrano from [facility] regarding personal care services via patient's insurance. She stated patient may be eligible for 80 hours of care/ 4 hours/ day per calendar year until benefit is exhausted. Christine Serrano will check benefit via patient's insurance company. Patient may eligible for personal care via [facility] for  20 hours/per week but would likely have to be placed on a waiting list.   Spoke with patient's roommate Christine Serrano. She is aware patient will be discharging back home today. She has not identified anyone that can assist with the patient's care. She was updated about additional care that may be available via insurance  or [facility]. She is acceptable to either. She is prepared to transport patient home after her shift at approximately 8 pm  MD and nurse notified.      Barriers to Discharge: Barriers Resolved  Expected Discharge Plan and Services     Post Acute Care Choice: Hospice Living arrangements for the past 2 months: Single Family Home Expected Discharge Date: 02/24/23                                     Social Determinants of Health (SDOH) Interventions SDOH Screenings   Food Insecurity: No Food Insecurity (02/21/2023)  Housing: Low Risk  (02/21/2023)  Transportation Needs: No Transportation Needs (02/21/2023)  Utilities: Not At Risk (02/21/2023)  Alcohol Screen: Low Risk  (10/01/2022)  Depression (PHQ2-9): High Risk (02/04/2023)  Financial Resource Strain: Low Risk  (10/01/2022)  Physical Activity: Inactive (10/01/2022)  Social Connections: Socially Isolated (10/01/2022)  Stress: Stress Concern Present  (10/01/2022)  Tobacco Use: High Risk (02/21/2023)    Readmission Risk Interventions    02/15/2023    5:23 PM 11/14/2022   10:20 AM  Readmission Risk Prevention Plan  Transportation Screening Complete Complete  PCP or Specialist Appt within 5-7 Days Complete   Home Care Screening Complete Complete  Medication Review (RN CM) Complete

## 2023-02-26 NOTE — Progress Notes (Signed)
[facility] Liaison Note  Patient will dc home today via private vehicle.  [facility] referrals team notified of discharge for today.    Please call with any Hospice related questions or concerns.   [facility] Liaison [phone]

## 2023-02-26 NOTE — Progress Notes (Signed)
Patient remained in the hospital despite discharging due to some nursing concern as she might not be safe at home.  Patient is alert and oriented and begging to go home.  She made it very clear that she wants to continue smoking and drinking and enjoy the life for the time she left.  Her roommate and caregiver was okay to take her back home. She cannot provide 24/7 care as she works but has installed a camera to keep an eye on her.  She is also trying to get hold of some other friends and our TOC is trying to get her some help through Medicaid.  She was again begging me that we should let her go home. Patient is medically stable and need hospice care at home for end-of-life care.  Had another discussion with her caregiver on phone and she is willing to pick her up after work today.  Again notified nursing staff and TOC.

## 2023-02-27 NOTE — Progress Notes (Signed)
Patient discharged home with caregiver with personal belongings. Wheeled out to personal vehicle. Caregiver expressed concern about patient being alone during the day. States she will follow up with hospice tomorrow.

## 2023-03-04 DIAGNOSIS — I4901 Ventricular fibrillation: Secondary | ICD-10-CM | POA: Diagnosis not present

## 2023-03-04 DIAGNOSIS — I469 Cardiac arrest, cause unspecified: Secondary | ICD-10-CM | POA: Diagnosis not present

## 2023-03-04 DIAGNOSIS — R0902 Hypoxemia: Secondary | ICD-10-CM | POA: Diagnosis not present

## 2023-03-04 DIAGNOSIS — I499 Cardiac arrhythmia, unspecified: Secondary | ICD-10-CM | POA: Diagnosis not present

## 2023-03-12 ENCOUNTER — Telehealth: Payer: Self-pay | Admitting: Family Medicine

## 2023-03-12 ENCOUNTER — Encounter: Payer: Self-pay | Admitting: Family Medicine

## 2023-03-12 NOTE — Telephone Encounter (Signed)
Spoke with Thereasa Distance of [facility] informing him Dr Reece Agar is working on the death certificate. He expresses his thanks.

## 2023-03-12 NOTE — Telephone Encounter (Signed)
I'm sorry to hear of her passing I was unaware. I spoke with her caregiver to offer my condolences.  Patient passed away last 2023/04/04 evening 9:35pm.  I will work on death certificate form.

## 2023-03-12 NOTE — Telephone Encounter (Signed)
Thereasa Distance from [employer] contacted the office informing us that as of 03/11/2023 at 9:35 pm, the patient has passed away in hospice care. Caller was wondering if Dr. Reece Agar will be the one to sign the death certificate? Requested a call back with an answer whenever possible. Can be reached at [phone]

## 2023-03-18 DEATH — deceased

## 2023-03-19 DIAGNOSIS — F22 Delusional disorders: Secondary | ICD-10-CM | POA: Insufficient documentation

## 2023-03-24 ENCOUNTER — Ambulatory Visit: Payer: 59 | Admitting: Pulmonary Disease

## 2023-03-26 ENCOUNTER — Encounter: Payer: 59 | Admitting: Family Medicine

## 2023-03-31 ENCOUNTER — Ambulatory Visit: Payer: Medicare HMO | Admitting: Pulmonary Disease

## 2023-05-22 NOTE — Progress Notes (Signed)
Audrey Bartlett is a 62 y.o. patient G0P0000, LMP PM who presents for her annual gyn exam.   (Seen with chaperone form [facility] due to her intellectual disabilities)    Denies PMB.  No VMS  Denies breast problems.  Denies bladder issues.  No VVV complaints  Not sexually active    Last Pap smear 05/30/2021 : neg (-hpv)  Mammogram:Needs to schedule ; prior 2014  Colonoscopy: needs to schedule    Patient's medications, allergies, past medical, surgical, social and family histories were reviewed and updated as appropriate.      No past medical history on file. Past Surgical History:   Procedure Laterality Date    CRANIECTOMY  10/2002    infratentorial for excison of menningioma    DILATION AND CURETTAGE OF UTERUS  01/2006    Polypectomy      Uterine with hysteroscope under U?S guidance due to small Arcute Uterus&Endocervical polypectomy    Straubismus Surgery of Superior Oblique Muscle  1963      Social History     Socioeconomic History    Marital status: Unknown   Tobacco Use    Smoking status: Never    Smokeless tobacco: Never   Substance and Sexual Activity    Alcohol use: Not Currently    Drug use: Never    Sexual activity: Not Currently    No family history on file.   Current Outpatient Medications   Medication Sig    donepezil (ARICEPT) 10 MG tablet Take 1 tablet (10 mg total) by mouth daily.    haloperidol (HALDOL) 0.5 mg tablet SMARTSIG:Tablet(s) By Mouth    levothyroxine (SYNTHROID, LEVOTHROID) 50 mcg tablet Take 1 tablet (50 mcg total) by mouth daily.    losartan-hydrochlorothiazide (HYZAAR) 50-12.5 mg per tablet Take 1 tablet by mouth daily.    mirtazapine (REMERON) 15 mg tablet Take 1 tablet (15 mg total) by mouth nightly.    haloperidol (HALDOL) 1 mg tablet Take 1 tablet (1 mg total) by mouth nightly.    calcium carbonate 250 mg/mL suspension Take 1,250 mg by mouth 2 times daily (with meals)    No Known Allergies (drug, envir, food or latex)   Patient Active Problem List   Diagnosis Code     Meningioma 2004 D32.9    Schizophrenia F20.9    Moderate intellectual disabilities F71    Strabismus H50.9    Anxiety and depression F41.9, F32.A    Hypothyroidism E03.9    Hypertension, benign I10         Physical Exam:    (Seen w/Chaperone)    BP 96/60 (BP Location: Right arm, Patient Position: Sitting, Cuff Size: adult)   Wt 62.6 kg (138 lb)     GENERAL: alert, oriented, well appearing in NAD.  NECK: supple, No mass or adenopathy  LUNGS: Symmetrical respirations unlabored with no audible wheezes.  BREASTS: No palpable masses or nipple discharge. No skin changes.   No axillary or clavicular adenopathy.  ABDOMEN: Soft, non-tender, no palpable masses.  PELVIC:   Normal external female genitalia, vulva, vestibule and  clitoris.   Bartholins, Skenes and urethral glands WNL.  Urethra is normal without prolapse or tenderness.  Vagina with moderate Atrophy, No Cystocele, Rectocele, or lesions.  Cervix: Normal appearing, There is no CMT  Uterus:  midline, mobile, and non-tender. No prolapse.  Adnexa are without obvious masses or tenderness.  Perineum normal.  RECTAL: normal sphincter tone, No fissures, hemorrhoids, rectal masses, or blood noted.  EXTREMITIES: Bilaterally no edema.  Assessment/Plan:    1. Well woman exam with routine gynecological exam    Cotest Pap every 3 years  Yearly mammogram encouraged and discussed  Colonoscopy screening guidelines discussed  COUNSELING provided in areas desired by patient.

## 2023-05-23 ENCOUNTER — Other Ambulatory Visit: Payer: Self-pay

## 2023-05-23 ENCOUNTER — Ambulatory Visit: Payer: Medicare (Managed Care) | Admitting: Obstetrics and Gynecology

## 2023-05-23 ENCOUNTER — Encounter: Payer: Self-pay | Admitting: Obstetrics and Gynecology

## 2023-05-23 VITALS — BP 96/60 | Wt 138.0 lb

## 2023-05-23 DIAGNOSIS — Z01419 Encounter for gynecological examination (general) (routine) without abnormal findings: Secondary | ICD-10-CM

## 2023-05-23 NOTE — Patient Instructions (Signed)
HEALTHY PRACTICES      In addition to your regular Ob/Gyn visit, please review this list of healthy practices.  Modifying your daily activities accordingly may improve your overall health and well-being.  If you have any questions regarding these recommendations, please feel free to discuss them with us.      DIET AND EXERCISE:    Limit fat and cholesterol.  Emphasize fruits and vegetables.    8 glasses of water daily will improve your health in many ways.    30 grams of daily fiber intake by diet and supplements improves weight management, reduces constipation, and may decrease the risk of colon cancer.    Exercise for 30 minutes at least five times a week.  Combine aerobics & weight training if possible.    Stay at a healthy weight.  Balance your caloric intake with calories burned.   We will be happy to review your ideal Body Mass Index with you.    **Obesity increases risk of diabetes, heart disease, stroke, high cholesterol, hypertension, sleep apnea, osteoarthritis, and some cancers such as breast and colon.          SEXUAL BEHAVIOR:    You should not feel pressured to engage in sexual behavior.  Please tell us if you are being abused.    Be sure to use contraception if pregnancy is not desired.    Emergency contraception is available without a prescription.  If you need it and have difficulty getting it at the drug store, please call our office.     Condoms help prevent sexually transmitted diseases.      Consider HIV testing if you have:   o Had more than one sexual partner.   o Had any sexually transmitted diseases.   o Used intravenous drugs.   o A sexual partner with these risks factors.   o A sexual partner who has had female homosexual exposure.   o Received a blood transfusion during 1978-1985.          SUBSTANCE ABUSE:    Do not smoke.  Ask us for assistance in quitting.    Avoid alcohol use when driving, boating, swimming, or operating other machinery.      A safe intake of alcohol for women is  considered 1 drink or less each day.      Recreational drug use (marijuana, cocaine, etc.) is dangerous and habit-forming.          OSTEOPOROSIS PREVENTION:    Osteoporosis is preventable and healthy habits are important at all ages: from building the bones before age 30, to limiting bone loss after menopause.  You may not have any symptoms until you break a bone!    Consume 3-4 servings of calcium-rich foods daily (milk, yogurt, cheese, green leafy vegetables) or use calcium supplementation for adequate calcium intake of 1000 -1200 mg depending on your age and hormone status.  Vitamin D intake of 600- 800 IU per day is necessary for calcium absorption.     Engage in regular weight-bearing or resistance exercise.    We recommend bone density screening (DXA) in all women over 65 and in younger women with risk factors    o Risks include: menopause, loss of height, tobacco, thyroid problems, history of an eating disorder, chronic steroid use    **DXA testing is quick and painless. This scan may help determine if additional life style changes or medication is necessary to prevent fractures.      BREAST HEALTH:     1. A mammogram should be done every year beginning at the age of 40, or earlier for those with a strong family history of premenopausal breast cancer.  The American College of Obstetricians and Gynecologists recommends every year screening beginning at age 40.          BLADDER PROBLEMS:    Urinary incontinence is a major source of embarrassment for many women.  It is a major reason for nursing home admissions and often results in isolation and depression.    Dr Jadene Stemmer runs our office Bladder Testing Division.  We recommend a complete evaluation if you are experiencing bothersome urine loss.  With proper evaluation, most people can be cured or markedly improved.  Besides surgeries, we offer MANY non-surgical options to improve bladder control issues.          COLON CANCER SURVEILLANCE:    Screening is  recommended at age 50 with a colonoscopy every ten years.  Earlier or more frequent testing may be advised based on family history and other risks.          HEPATITIS C SCREENING:    Screening is recommended if you were born between 1945-1965.    Screening is also recommended for those who have had a blood transfusion before 1992, have used IV drugs, or have had a sexual partner who has used IV drugs.          INJURY PREVENTION:    Do not text and drive.  It is more dangerous than drunk driving.     Seat belts should be worn at all times, even during short trips “around the block.”    Wear a helmet when using bicycles, motorcycles, roller blades, ATV’s, or skiing.    Place smoke detectors in your house and replace the batteries twice a year.    Store guns and firearms unloaded, in a locked area.  Trigger locks should also be used.    Consider CPR training for household members. New parents should learn infant CPR.    Remove loose rugs and clutter and keep up on your eye glass prescriptions to prevent falls and broken bones.    **If you are currently in a relationship where you feel threatened or unsafe, please discuss your situation with us or call [facility] at [phone]. Help is available 24 hours a day.          IMMUNIZATIONS:    Influenza vaccine is advised yearly.    A pertussis, or “whooping cough”, vaccine is recommended once as an adult, as well as during every pregnancy. It is given in combination with Tetanus/diphtheria (Tdap).    A tetanus/diphtheria (Td) booster shot is otherwise recommended every 10 years.    The HPV vaccine series is recommended ages 9-26 years. The vaccine protects against some of the high risk human papillomaviruses that cause cervical cancer.   We currently offer this vaccine for our patients.    A Measles-Mumps-Rubella (MMR) vaccine is indicated for non-pregnant women born after 1958 unless there is documentation of previous immunization or proof of  immunity.    Adults susceptible to varicella (never had chicken pox or childhood vaccine) should be vaccinated.    A zoster vaccination is recommended once after the age of 60 (to prevent shingles).     Pneumococcal pneumonia vaccine is indicated once for women age 65 and older.    Hepatitis A and/or B vaccines are recommended for high-risk individuals.            DENTAL HEALTH:    Schedule regular visits to the dentist.    Floss and brush with fluoride toothpaste daily.          HEALTH CARE PROXY:   1. All adults should have a health care proxy (a legal document that designates another person to make health care decisions for you in case you were unable) and consider a living will.  We have these forms available per request.

## 2023-11-20 ENCOUNTER — Encounter: Payer: Self-pay | Admitting: Obstetrics and Gynecology

## 2023-11-20 ENCOUNTER — Telehealth: Payer: Self-pay

## 2023-11-20 NOTE — Telephone Encounter (Signed)
 Hello this pts group home called and is asking if I can write a letter to state that the pt is not due until 2026 for her GA.

## 2024-04-19 ENCOUNTER — Other Ambulatory Visit: Payer: Self-pay | Admitting: Internal Medicine

## 2024-04-19 MED ORDER — MEMANTINE HCL ER 28 MG PO CP24 *A*
28.0000 mg | ORAL_CAPSULE | Freq: Every day | ORAL | 5 refills | Status: AC
Start: 2024-04-19 — End: ?

## 2024-04-19 NOTE — Telephone Encounter (Signed)
 Memantine HCl 28 mg Oral DAILY pendedLast Telemed:    Visit date not found Last office visit:     Visit date not foundLast PA office visit: Visit date not foundPatients upcoming appointments: Future Appointments Date Time Provider Department Center 09/29/2024  9:00 AM Reece Lenis, MD [facility] None Opioid Drug Screen:No results for input(s): AMPU, BEU, OPSU, OXYU, THCU, BZDU, COPS, CTHC, UCRNC, TRAMD in the last 8760 hours.Last dispensed if controlled:  Recent Lab results:GENERAL CHEMISTRY No value within the past 365 days LIPID PROFILE No value within the past 365 days LIVER PROFILE No value within the past 365 days DIABETES THYROID No value within the past 365 days No value within the past 365 days

## 2024-05-05 ENCOUNTER — Telehealth: Payer: Self-pay | Admitting: Internal Medicine

## 2024-05-05 DIAGNOSIS — M25561 Pain in right knee: Secondary | ICD-10-CM

## 2024-05-05 NOTE — Telephone Encounter (Signed)
 Audrey Bartlett from [facility] called.Pain management told them that patient needs a referral to orthopedic doctor for her right knee pain.Can Dr. Kopp refer her to one?  Please call Audrey Bartlett back at [phone] with the referral information.

## 2024-05-05 NOTE — Telephone Encounter (Signed)
Referral done. Thank you.

## 2024-05-06 NOTE — Telephone Encounter (Signed)
Called and left a message asking her to call back.

## 2024-05-11 ENCOUNTER — Other Ambulatory Visit: Payer: Self-pay | Admitting: Internal Medicine

## 2024-05-11 NOTE — Telephone Encounter (Signed)
 Refill requestMedication: Calcium and Vit DConfirm that the patient wants the medication sent to: [facility], [facility] - [place], [place] - [id number] [address]Eynwz: [phone] Fax: [phone]  Additional message: Relevant labs:Last Office Visit  None   Last Telemedicine Visit  None Last PA Office Visit  None  Next visit: Future Encounters    09/29/2024  9:00 AM [facility]  PHYSICAL  Authorization not required  [facility]  Reece Lenis, MD       Additional information to be completed by clinical staff:If the request is for a controlled substance please paste the iStop results below:**If the refill protocol indicates they are due for labs please order them and tell the patient to have them drawn**

## 2024-05-11 NOTE — Telephone Encounter (Signed)
 Audrey Bartlett, [facility] calling back.  Informed her referral has been placed.Writer provided information to [employer] [phone] phone.Any questions, call [phone] (direct cell)

## 2024-05-12 ENCOUNTER — Encounter: Payer: Self-pay | Admitting: Obstetrics and Gynecology

## 2024-06-14 ENCOUNTER — Encounter: Payer: Self-pay | Admitting: Gastroenterology

## 2024-06-30 ENCOUNTER — Ambulatory Visit: Payer: Medicare (Managed Care)

## 2024-07-22 ENCOUNTER — Telehealth: Payer: Self-pay | Admitting: Internal Medicine

## 2024-07-22 NOTE — Telephone Encounter (Signed)
 [facility] [phone] States Audrey Bartlett is due for mammo and a dexa scan.States she is also due for a fit test- they usually do the fit test their but was wondering if a colonoscopy will be ordered. Last fit test was 09/2023 negative resultPlease advise

## 2024-07-22 NOTE — Telephone Encounter (Signed)
Alleta is aware.

## 2024-07-22 NOTE — Telephone Encounter (Signed)
 Please tell them that mammogram was done May 2025 and is recommended every 1 to 2 years.  Not needed yet.  We are doing stool fit test for colon cancer screening which I think is more appropriate and easier for her than a colonoscopy.  We can discuss this at her appointment in April or if they want we can order this now.  Last DEXA scan was April 2024 and showed osteopenia.  I am planning on waiting 1 more year before repeating it

## 2024-07-22 NOTE — Telephone Encounter (Signed)
 Last mammogram was 10/2023 - please advise

## 2024-08-09 ENCOUNTER — Ambulatory Visit: Payer: Medicare (Managed Care)

## 2024-09-29 ENCOUNTER — Encounter: Payer: Medicare (Managed Care) | Admitting: Internal Medicine
